# Patient Record
Sex: Female | Born: 1950 | Race: Black or African American | Hispanic: No | Smoking: Current every day smoker
Health system: Southern US, Community
[De-identification: ages and names within clinical notes are randomized; demographics above are authoritative.]

## PROBLEM LIST (undated history)

## (undated) DIAGNOSIS — M7989 Other specified soft tissue disorders: Secondary | ICD-10-CM

## (undated) DIAGNOSIS — I509 Heart failure, unspecified: Secondary | ICD-10-CM

## (undated) DIAGNOSIS — I10 Essential (primary) hypertension: Secondary | ICD-10-CM

## (undated) DIAGNOSIS — F209 Schizophrenia, unspecified: Secondary | ICD-10-CM

## (undated) DIAGNOSIS — M1990 Unspecified osteoarthritis, unspecified site: Secondary | ICD-10-CM

## (undated) DIAGNOSIS — D126 Benign neoplasm of colon, unspecified: Secondary | ICD-10-CM

## (undated) DIAGNOSIS — J45909 Unspecified asthma, uncomplicated: Secondary | ICD-10-CM

## (undated) DIAGNOSIS — F319 Bipolar disorder, unspecified: Secondary | ICD-10-CM

## (undated) DIAGNOSIS — R911 Solitary pulmonary nodule: Secondary | ICD-10-CM

## (undated) DIAGNOSIS — J189 Pneumonia, unspecified organism: Secondary | ICD-10-CM

## (undated) DIAGNOSIS — J449 Chronic obstructive pulmonary disease, unspecified: Secondary | ICD-10-CM

## (undated) DIAGNOSIS — I4891 Unspecified atrial fibrillation: Secondary | ICD-10-CM

## (undated) DIAGNOSIS — N281 Cyst of kidney, acquired: Secondary | ICD-10-CM

## (undated) DIAGNOSIS — E039 Hypothyroidism, unspecified: Secondary | ICD-10-CM

## (undated) DIAGNOSIS — E079 Disorder of thyroid, unspecified: Secondary | ICD-10-CM

## (undated) DIAGNOSIS — E876 Hypokalemia: Secondary | ICD-10-CM

## (undated) DIAGNOSIS — E1129 Type 2 diabetes mellitus with other diabetic kidney complication: Secondary | ICD-10-CM

## (undated) DIAGNOSIS — N182 Chronic kidney disease, stage 2 (mild): Secondary | ICD-10-CM

## (undated) DIAGNOSIS — K760 Fatty (change of) liver, not elsewhere classified: Secondary | ICD-10-CM

## (undated) DIAGNOSIS — G8929 Other chronic pain: Secondary | ICD-10-CM

## (undated) DIAGNOSIS — M545 Low back pain, unspecified: Secondary | ICD-10-CM

## (undated) DIAGNOSIS — M419 Scoliosis, unspecified: Secondary | ICD-10-CM

## (undated) DIAGNOSIS — K319 Disease of stomach and duodenum, unspecified: Secondary | ICD-10-CM

## (undated) DIAGNOSIS — E7800 Pure hypercholesterolemia, unspecified: Secondary | ICD-10-CM

## (undated) DIAGNOSIS — R0600 Dyspnea, unspecified: Secondary | ICD-10-CM

## (undated) HISTORY — DX: Hypokalemia: E87.6

## (undated) HISTORY — DX: Disease of stomach and duodenum, unspecified: K31.9

## (undated) HISTORY — DX: Disorder of thyroid, unspecified: E07.9

## (undated) HISTORY — DX: Schizophrenia, unspecified: F20.9

## (undated) HISTORY — PX: TOE SURGERY: SHX1073

## (undated) HISTORY — PX: KNEE ARTHROSCOPY: SHX127

## (undated) HISTORY — DX: Pure hypercholesterolemia, unspecified: E78.00

## (undated) HISTORY — DX: Heart failure, unspecified: I50.9

## (undated) HISTORY — PX: TONSILLECTOMY AND ADENOIDECTOMY: SUR1326

## (undated) HISTORY — DX: Bipolar disorder, unspecified: F31.9

## (undated) HISTORY — DX: Fatty (change of) liver, not elsewhere classified: K76.0

## (undated) HISTORY — DX: Other specified soft tissue disorders: M79.89

## (undated) HISTORY — DX: Cyst of kidney, acquired: N28.1

## (undated) HISTORY — DX: Chronic obstructive pulmonary disease, unspecified: J44.9

## (undated) HISTORY — DX: Type 2 diabetes mellitus with other diabetic kidney complication: E11.29

## (undated) HISTORY — DX: Essential (primary) hypertension: I10

## (undated) HISTORY — PX: FRACTURE SURGERY: SHX138

## (undated) HISTORY — PX: FOOT FRACTURE SURGERY: SHX645

## (undated) HISTORY — DX: Chronic kidney disease, stage 2 (mild): N18.2

## (undated) HISTORY — DX: Unspecified atrial fibrillation: I48.91

## (undated) HISTORY — DX: Hypothyroidism, unspecified: E03.9

## (undated) HISTORY — PX: FOOT SURGERY: SHX648

## (undated) HISTORY — DX: Solitary pulmonary nodule: R91.1

## (undated) HISTORY — DX: Benign neoplasm of colon, unspecified: D12.6

---

## 1997-08-11 ENCOUNTER — Inpatient Hospital Stay (HOSPITAL_COMMUNITY): Admission: EM | Admit: 1997-08-11 | Discharge: 1997-08-11 | Payer: Self-pay | Admitting: *Deleted

## 1997-10-14 ENCOUNTER — Ambulatory Visit (HOSPITAL_COMMUNITY): Admission: RE | Admit: 1997-10-14 | Discharge: 1997-10-14 | Payer: Self-pay | Admitting: *Deleted

## 1997-11-03 ENCOUNTER — Inpatient Hospital Stay (HOSPITAL_COMMUNITY): Admission: RE | Admit: 1997-11-03 | Discharge: 1997-11-03 | Payer: Self-pay | Admitting: *Deleted

## 1997-11-28 ENCOUNTER — Ambulatory Visit (HOSPITAL_COMMUNITY): Admission: RE | Admit: 1997-11-28 | Discharge: 1997-11-28 | Payer: Self-pay | Admitting: *Deleted

## 1997-12-25 ENCOUNTER — Ambulatory Visit (HOSPITAL_COMMUNITY): Admission: RE | Admit: 1997-12-25 | Discharge: 1997-12-25 | Payer: Self-pay | Admitting: *Deleted

## 1998-01-26 ENCOUNTER — Inpatient Hospital Stay (HOSPITAL_COMMUNITY): Admission: AD | Admit: 1998-01-26 | Discharge: 1998-01-26 | Payer: Self-pay | Admitting: *Deleted

## 1998-01-30 ENCOUNTER — Ambulatory Visit (HOSPITAL_COMMUNITY): Admission: RE | Admit: 1998-01-30 | Discharge: 1998-01-30 | Payer: Self-pay | Admitting: *Deleted

## 1998-02-06 ENCOUNTER — Emergency Department (HOSPITAL_COMMUNITY): Admission: EM | Admit: 1998-02-06 | Discharge: 1998-02-06 | Payer: Self-pay | Admitting: Emergency Medicine

## 1998-04-20 ENCOUNTER — Inpatient Hospital Stay (HOSPITAL_COMMUNITY): Admission: AD | Admit: 1998-04-20 | Discharge: 1998-04-20 | Payer: Self-pay | Admitting: Obstetrics & Gynecology

## 1998-04-28 ENCOUNTER — Ambulatory Visit (HOSPITAL_COMMUNITY): Admission: RE | Admit: 1998-04-28 | Discharge: 1998-04-28 | Payer: Self-pay | Admitting: *Deleted

## 1998-05-26 ENCOUNTER — Encounter: Payer: Self-pay | Admitting: *Deleted

## 1998-07-22 ENCOUNTER — Inpatient Hospital Stay (HOSPITAL_COMMUNITY): Admission: AD | Admit: 1998-07-22 | Discharge: 1998-07-22 | Payer: Self-pay | Admitting: *Deleted

## 1998-08-25 ENCOUNTER — Encounter: Admission: RE | Admit: 1998-08-25 | Discharge: 1998-08-25 | Payer: Self-pay | Admitting: Sports Medicine

## 1998-09-22 ENCOUNTER — Encounter: Admission: RE | Admit: 1998-09-22 | Discharge: 1998-09-22 | Payer: Self-pay | Admitting: Sports Medicine

## 1998-10-14 ENCOUNTER — Inpatient Hospital Stay (HOSPITAL_COMMUNITY): Admission: AD | Admit: 1998-10-14 | Discharge: 1998-10-14 | Payer: Self-pay | Admitting: *Deleted

## 1998-10-27 ENCOUNTER — Encounter: Admission: RE | Admit: 1998-10-27 | Discharge: 1998-10-27 | Payer: Self-pay | Admitting: Sports Medicine

## 1998-11-02 ENCOUNTER — Encounter: Admission: RE | Admit: 1998-11-02 | Discharge: 1998-11-02 | Payer: Self-pay | Admitting: Family Medicine

## 1998-11-17 ENCOUNTER — Encounter: Admission: RE | Admit: 1998-11-17 | Discharge: 1998-11-17 | Payer: Self-pay | Admitting: Sports Medicine

## 1998-11-17 ENCOUNTER — Ambulatory Visit (HOSPITAL_COMMUNITY): Admission: RE | Admit: 1998-11-17 | Discharge: 1998-11-17 | Payer: Self-pay | Admitting: *Deleted

## 1998-11-27 ENCOUNTER — Encounter: Admission: RE | Admit: 1998-11-27 | Discharge: 1998-11-27 | Payer: Self-pay | Admitting: Family Medicine

## 1998-12-01 ENCOUNTER — Ambulatory Visit (HOSPITAL_COMMUNITY): Admission: RE | Admit: 1998-12-01 | Discharge: 1998-12-01 | Payer: Self-pay | Admitting: Family Medicine

## 1998-12-08 ENCOUNTER — Encounter: Payer: Self-pay | Admitting: Sports Medicine

## 1998-12-08 ENCOUNTER — Ambulatory Visit (HOSPITAL_COMMUNITY): Admission: RE | Admit: 1998-12-08 | Discharge: 1998-12-08 | Payer: Self-pay | Admitting: Sports Medicine

## 1998-12-18 ENCOUNTER — Emergency Department (HOSPITAL_COMMUNITY): Admission: EM | Admit: 1998-12-18 | Discharge: 1998-12-18 | Payer: Self-pay | Admitting: Emergency Medicine

## 1998-12-18 ENCOUNTER — Encounter: Payer: Self-pay | Admitting: Emergency Medicine

## 1998-12-23 ENCOUNTER — Encounter: Admission: RE | Admit: 1998-12-23 | Discharge: 1998-12-23 | Payer: Self-pay | Admitting: Family Medicine

## 1999-01-06 ENCOUNTER — Ambulatory Visit (HOSPITAL_COMMUNITY): Admission: RE | Admit: 1999-01-06 | Discharge: 1999-01-06 | Payer: Self-pay | Admitting: Family Medicine

## 1999-01-13 ENCOUNTER — Inpatient Hospital Stay (HOSPITAL_COMMUNITY): Admission: AD | Admit: 1999-01-13 | Discharge: 1999-01-13 | Payer: Self-pay | Admitting: *Deleted

## 1999-01-13 ENCOUNTER — Encounter: Admission: RE | Admit: 1999-01-13 | Discharge: 1999-01-13 | Payer: Self-pay | Admitting: Family Medicine

## 1999-01-19 ENCOUNTER — Encounter: Admission: RE | Admit: 1999-01-19 | Discharge: 1999-01-19 | Payer: Self-pay | Admitting: Family Medicine

## 1999-02-03 ENCOUNTER — Encounter: Admission: RE | Admit: 1999-02-03 | Discharge: 1999-02-03 | Payer: Self-pay | Admitting: Family Medicine

## 1999-03-03 ENCOUNTER — Encounter: Admission: RE | Admit: 1999-03-03 | Discharge: 1999-03-03 | Payer: Self-pay | Admitting: Family Medicine

## 1999-03-12 ENCOUNTER — Encounter: Admission: RE | Admit: 1999-03-12 | Discharge: 1999-03-12 | Payer: Self-pay | Admitting: Family Medicine

## 1999-03-14 ENCOUNTER — Encounter: Payer: Self-pay | Admitting: Emergency Medicine

## 1999-03-14 ENCOUNTER — Emergency Department (HOSPITAL_COMMUNITY): Admission: EM | Admit: 1999-03-14 | Discharge: 1999-03-14 | Payer: Self-pay | Admitting: *Deleted

## 1999-03-25 ENCOUNTER — Encounter: Admission: RE | Admit: 1999-03-25 | Discharge: 1999-03-25 | Payer: Self-pay | Admitting: Family Medicine

## 1999-04-07 ENCOUNTER — Inpatient Hospital Stay (HOSPITAL_COMMUNITY): Admission: AD | Admit: 1999-04-07 | Discharge: 1999-04-07 | Payer: Self-pay | Admitting: *Deleted

## 1999-04-08 ENCOUNTER — Encounter: Admission: RE | Admit: 1999-04-08 | Discharge: 1999-04-08 | Payer: Self-pay | Admitting: Family Medicine

## 1999-04-20 ENCOUNTER — Encounter: Admission: RE | Admit: 1999-04-20 | Discharge: 1999-04-20 | Payer: Self-pay | Admitting: Sports Medicine

## 1999-04-29 ENCOUNTER — Encounter: Admission: RE | Admit: 1999-04-29 | Discharge: 1999-04-29 | Payer: Self-pay | Admitting: Orthopedic Surgery

## 1999-04-29 ENCOUNTER — Encounter: Payer: Self-pay | Admitting: Orthopedic Surgery

## 1999-05-11 ENCOUNTER — Encounter: Admission: RE | Admit: 1999-05-11 | Discharge: 1999-05-11 | Payer: Self-pay | Admitting: Sports Medicine

## 1999-05-12 ENCOUNTER — Encounter: Admission: RE | Admit: 1999-05-12 | Discharge: 1999-05-12 | Payer: Self-pay | Admitting: Family Medicine

## 1999-05-31 ENCOUNTER — Encounter: Payer: Self-pay | Admitting: *Deleted

## 1999-05-31 ENCOUNTER — Encounter: Admission: RE | Admit: 1999-05-31 | Discharge: 1999-05-31 | Payer: Self-pay | Admitting: *Deleted

## 1999-06-03 ENCOUNTER — Encounter: Admission: RE | Admit: 1999-06-03 | Discharge: 1999-06-03 | Payer: Self-pay | Admitting: Family Medicine

## 1999-07-08 ENCOUNTER — Encounter: Admission: RE | Admit: 1999-07-08 | Discharge: 1999-07-08 | Payer: Self-pay | Admitting: Family Medicine

## 1999-07-23 ENCOUNTER — Encounter: Admission: RE | Admit: 1999-07-23 | Discharge: 1999-07-23 | Payer: Self-pay | Admitting: Family Medicine

## 1999-08-02 ENCOUNTER — Encounter: Admission: RE | Admit: 1999-08-02 | Discharge: 1999-08-02 | Payer: Self-pay | Admitting: Family Medicine

## 1999-08-11 ENCOUNTER — Encounter: Admission: RE | Admit: 1999-08-11 | Discharge: 1999-08-11 | Payer: Self-pay | Admitting: Family Medicine

## 1999-09-18 ENCOUNTER — Encounter: Payer: Self-pay | Admitting: Emergency Medicine

## 1999-09-18 ENCOUNTER — Inpatient Hospital Stay (HOSPITAL_COMMUNITY): Admission: EM | Admit: 1999-09-18 | Discharge: 1999-09-20 | Payer: Self-pay | Admitting: *Deleted

## 1999-09-20 ENCOUNTER — Encounter: Payer: Self-pay | Admitting: Family Medicine

## 1999-09-22 ENCOUNTER — Encounter: Admission: RE | Admit: 1999-09-22 | Discharge: 1999-09-22 | Payer: Self-pay | Admitting: Family Medicine

## 1999-10-21 ENCOUNTER — Encounter: Admission: RE | Admit: 1999-10-21 | Discharge: 1999-10-21 | Payer: Self-pay | Admitting: Family Medicine

## 1999-11-04 ENCOUNTER — Encounter: Payer: Self-pay | Admitting: Gastroenterology

## 1999-11-04 ENCOUNTER — Encounter: Admission: RE | Admit: 1999-11-04 | Discharge: 1999-11-04 | Payer: Self-pay | Admitting: Gastroenterology

## 2000-01-14 ENCOUNTER — Encounter: Admission: RE | Admit: 2000-01-14 | Discharge: 2000-01-14 | Payer: Self-pay | Admitting: Family Medicine

## 2000-01-19 ENCOUNTER — Encounter: Admission: RE | Admit: 2000-01-19 | Discharge: 2000-01-19 | Payer: Self-pay | Admitting: Family Medicine

## 2000-02-07 ENCOUNTER — Encounter: Admission: RE | Admit: 2000-02-07 | Discharge: 2000-03-01 | Payer: Self-pay | Admitting: Orthopedic Surgery

## 2000-03-11 ENCOUNTER — Encounter: Payer: Self-pay | Admitting: *Deleted

## 2000-03-11 ENCOUNTER — Emergency Department (HOSPITAL_COMMUNITY): Admission: EM | Admit: 2000-03-11 | Discharge: 2000-03-11 | Payer: Self-pay | Admitting: Emergency Medicine

## 2000-04-20 ENCOUNTER — Encounter: Admission: RE | Admit: 2000-04-20 | Discharge: 2000-04-20 | Payer: Self-pay | Admitting: Family Medicine

## 2000-04-26 ENCOUNTER — Encounter: Admission: RE | Admit: 2000-04-26 | Discharge: 2000-04-26 | Payer: Self-pay | Admitting: Family Medicine

## 2000-04-27 ENCOUNTER — Encounter: Admission: RE | Admit: 2000-04-27 | Discharge: 2000-04-27 | Payer: Self-pay | Admitting: Family Medicine

## 2000-05-03 ENCOUNTER — Encounter: Payer: Self-pay | Admitting: *Deleted

## 2000-05-03 ENCOUNTER — Encounter: Admission: RE | Admit: 2000-05-03 | Discharge: 2000-05-03 | Payer: Self-pay | Admitting: *Deleted

## 2000-06-08 ENCOUNTER — Encounter: Admission: RE | Admit: 2000-06-08 | Discharge: 2000-06-08 | Payer: Self-pay | Admitting: *Deleted

## 2000-06-08 ENCOUNTER — Encounter: Payer: Self-pay | Admitting: *Deleted

## 2000-07-21 ENCOUNTER — Encounter: Admission: RE | Admit: 2000-07-21 | Discharge: 2000-07-21 | Payer: Self-pay | Admitting: Family Medicine

## 2000-08-28 ENCOUNTER — Encounter: Admission: RE | Admit: 2000-08-28 | Discharge: 2000-08-28 | Payer: Self-pay | Admitting: Family Medicine

## 2000-09-06 ENCOUNTER — Observation Stay (HOSPITAL_COMMUNITY): Admission: EM | Admit: 2000-09-06 | Discharge: 2000-09-06 | Payer: Self-pay | Admitting: Emergency Medicine

## 2000-09-06 ENCOUNTER — Encounter: Payer: Self-pay | Admitting: Internal Medicine

## 2000-09-19 ENCOUNTER — Encounter: Admission: RE | Admit: 2000-09-19 | Discharge: 2000-09-19 | Payer: Self-pay | Admitting: Family Medicine

## 2000-10-06 ENCOUNTER — Encounter: Admission: RE | Admit: 2000-10-06 | Discharge: 2000-10-06 | Payer: Self-pay | Admitting: Family Medicine

## 2000-10-23 ENCOUNTER — Emergency Department (HOSPITAL_COMMUNITY): Admission: EM | Admit: 2000-10-23 | Discharge: 2000-10-24 | Payer: Self-pay | Admitting: Emergency Medicine

## 2000-10-24 ENCOUNTER — Encounter: Admission: RE | Admit: 2000-10-24 | Discharge: 2000-10-24 | Payer: Self-pay | Admitting: Family Medicine

## 2000-10-24 ENCOUNTER — Encounter: Payer: Self-pay | Admitting: Family Medicine

## 2000-10-25 ENCOUNTER — Encounter: Admission: RE | Admit: 2000-10-25 | Discharge: 2000-10-25 | Payer: Self-pay | Admitting: Family Medicine

## 2000-10-26 ENCOUNTER — Encounter: Payer: Self-pay | Admitting: Sports Medicine

## 2000-10-26 ENCOUNTER — Encounter: Admission: RE | Admit: 2000-10-26 | Discharge: 2000-10-26 | Payer: Self-pay | Admitting: Sports Medicine

## 2000-10-26 ENCOUNTER — Encounter: Admission: RE | Admit: 2000-10-26 | Discharge: 2000-10-26 | Payer: Self-pay | Admitting: Family Medicine

## 2000-11-06 ENCOUNTER — Encounter: Admission: RE | Admit: 2000-11-06 | Discharge: 2000-11-06 | Payer: Self-pay | Admitting: Family Medicine

## 2000-11-21 ENCOUNTER — Encounter: Admission: RE | Admit: 2000-11-21 | Discharge: 2000-12-20 | Payer: Self-pay | Admitting: *Deleted

## 2000-11-29 ENCOUNTER — Encounter: Admission: RE | Admit: 2000-11-29 | Discharge: 2000-11-29 | Payer: Self-pay | Admitting: Family Medicine

## 2000-12-12 ENCOUNTER — Encounter: Admission: RE | Admit: 2000-12-12 | Discharge: 2000-12-12 | Payer: Self-pay | Admitting: Family Medicine

## 2001-02-21 ENCOUNTER — Other Ambulatory Visit: Admission: RE | Admit: 2001-02-21 | Discharge: 2001-02-21 | Payer: Self-pay | Admitting: *Deleted

## 2001-02-21 ENCOUNTER — Encounter: Admission: RE | Admit: 2001-02-21 | Discharge: 2001-02-21 | Payer: Self-pay | Admitting: Family Medicine

## 2001-03-23 ENCOUNTER — Encounter: Admission: RE | Admit: 2001-03-23 | Discharge: 2001-03-23 | Payer: Self-pay | Admitting: Family Medicine

## 2001-06-22 ENCOUNTER — Encounter: Admission: RE | Admit: 2001-06-22 | Discharge: 2001-06-22 | Payer: Self-pay | Admitting: Family Medicine

## 2001-07-18 ENCOUNTER — Encounter: Admission: RE | Admit: 2001-07-18 | Discharge: 2001-07-18 | Payer: Self-pay | Admitting: Family Medicine

## 2001-08-01 ENCOUNTER — Encounter: Admission: RE | Admit: 2001-08-01 | Discharge: 2001-08-01 | Payer: Self-pay | Admitting: Family Medicine

## 2001-08-29 ENCOUNTER — Encounter: Payer: Self-pay | Admitting: Obstetrics and Gynecology

## 2001-08-29 ENCOUNTER — Encounter: Admission: RE | Admit: 2001-08-29 | Discharge: 2001-08-29 | Payer: Self-pay | Admitting: Obstetrics and Gynecology

## 2001-09-13 ENCOUNTER — Encounter: Admission: RE | Admit: 2001-09-13 | Discharge: 2001-09-13 | Payer: Self-pay | Admitting: Family Medicine

## 2001-09-24 ENCOUNTER — Encounter: Admission: RE | Admit: 2001-09-24 | Discharge: 2001-09-24 | Payer: Self-pay | Admitting: Sports Medicine

## 2002-02-08 ENCOUNTER — Encounter: Admission: RE | Admit: 2002-02-08 | Discharge: 2002-02-08 | Payer: Self-pay | Admitting: Family Medicine

## 2002-03-15 ENCOUNTER — Encounter: Admission: RE | Admit: 2002-03-15 | Discharge: 2002-03-15 | Payer: Self-pay | Admitting: Family Medicine

## 2002-03-28 ENCOUNTER — Encounter: Admission: RE | Admit: 2002-03-28 | Discharge: 2002-03-28 | Payer: Self-pay | Admitting: Family Medicine

## 2002-05-23 ENCOUNTER — Encounter: Admission: RE | Admit: 2002-05-23 | Discharge: 2002-05-23 | Payer: Self-pay | Admitting: Family Medicine

## 2002-05-23 ENCOUNTER — Other Ambulatory Visit: Admission: RE | Admit: 2002-05-23 | Discharge: 2002-05-23 | Payer: Self-pay

## 2002-08-29 ENCOUNTER — Encounter: Admission: RE | Admit: 2002-08-29 | Discharge: 2002-08-29 | Payer: Self-pay | Admitting: Family Medicine

## 2002-09-25 ENCOUNTER — Encounter: Admission: RE | Admit: 2002-09-25 | Discharge: 2002-09-25 | Payer: Self-pay | Admitting: Internal Medicine

## 2002-09-25 ENCOUNTER — Encounter: Payer: Self-pay | Admitting: Sports Medicine

## 2003-01-03 ENCOUNTER — Encounter: Admission: RE | Admit: 2003-01-03 | Discharge: 2003-01-03 | Payer: Self-pay | Admitting: Family Medicine

## 2003-04-09 ENCOUNTER — Encounter: Admission: RE | Admit: 2003-04-09 | Discharge: 2003-04-09 | Payer: Self-pay | Admitting: Family Medicine

## 2003-07-23 ENCOUNTER — Encounter: Admission: RE | Admit: 2003-07-23 | Discharge: 2003-07-23 | Payer: Self-pay | Admitting: Family Medicine

## 2003-07-23 ENCOUNTER — Other Ambulatory Visit: Admission: RE | Admit: 2003-07-23 | Discharge: 2003-07-23 | Payer: Self-pay | Admitting: Family Medicine

## 2003-08-15 ENCOUNTER — Encounter: Admission: RE | Admit: 2003-08-15 | Discharge: 2003-08-15 | Payer: Self-pay | Admitting: Family Medicine

## 2003-10-10 ENCOUNTER — Encounter: Admission: RE | Admit: 2003-10-10 | Discharge: 2003-10-10 | Payer: Self-pay | Admitting: Sports Medicine

## 2003-12-12 ENCOUNTER — Encounter: Admission: RE | Admit: 2003-12-12 | Discharge: 2003-12-12 | Payer: Self-pay | Admitting: Sports Medicine

## 2004-03-18 ENCOUNTER — Ambulatory Visit: Payer: Self-pay | Admitting: Family Medicine

## 2004-04-07 ENCOUNTER — Encounter: Admission: RE | Admit: 2004-04-07 | Discharge: 2004-07-06 | Payer: Self-pay | Admitting: Orthopedic Surgery

## 2004-05-31 ENCOUNTER — Ambulatory Visit: Payer: Self-pay | Admitting: Family Medicine

## 2004-08-17 ENCOUNTER — Ambulatory Visit: Payer: Self-pay | Admitting: Sports Medicine

## 2004-12-01 ENCOUNTER — Encounter: Admission: RE | Admit: 2004-12-01 | Discharge: 2004-12-01 | Payer: Self-pay | Admitting: Sports Medicine

## 2004-12-29 ENCOUNTER — Ambulatory Visit: Payer: Self-pay | Admitting: Family Medicine

## 2005-02-13 ENCOUNTER — Emergency Department (HOSPITAL_COMMUNITY): Admission: EM | Admit: 2005-02-13 | Discharge: 2005-02-13 | Payer: Self-pay | Admitting: Emergency Medicine

## 2005-02-23 ENCOUNTER — Ambulatory Visit: Payer: Self-pay | Admitting: Family Medicine

## 2005-04-19 ENCOUNTER — Ambulatory Visit: Payer: Self-pay | Admitting: Family Medicine

## 2005-06-17 ENCOUNTER — Ambulatory Visit: Payer: Self-pay | Admitting: Family Medicine

## 2005-08-04 ENCOUNTER — Encounter (INDEPENDENT_AMBULATORY_CARE_PROVIDER_SITE_OTHER): Payer: Self-pay | Admitting: *Deleted

## 2005-08-04 LAB — CONVERTED CEMR LAB

## 2005-08-17 ENCOUNTER — Ambulatory Visit: Payer: Self-pay | Admitting: Family Medicine

## 2005-12-02 ENCOUNTER — Ambulatory Visit: Payer: Self-pay | Admitting: Family Medicine

## 2006-03-10 ENCOUNTER — Ambulatory Visit: Payer: Self-pay | Admitting: Family Medicine

## 2006-05-18 ENCOUNTER — Ambulatory Visit: Payer: Self-pay | Admitting: Emergency Medicine

## 2006-06-23 ENCOUNTER — Ambulatory Visit: Payer: Self-pay | Admitting: Family Medicine

## 2006-06-28 ENCOUNTER — Ambulatory Visit: Payer: Self-pay | Admitting: Critical Care Medicine

## 2006-07-10 ENCOUNTER — Encounter (INDEPENDENT_AMBULATORY_CARE_PROVIDER_SITE_OTHER): Payer: Self-pay | Admitting: *Deleted

## 2006-07-10 ENCOUNTER — Other Ambulatory Visit: Admission: RE | Admit: 2006-07-10 | Discharge: 2006-07-10 | Payer: Self-pay | Admitting: *Deleted

## 2006-07-10 ENCOUNTER — Ambulatory Visit: Payer: Self-pay | Admitting: Family Medicine

## 2006-07-18 ENCOUNTER — Ambulatory Visit: Payer: Self-pay | Admitting: Internal Medicine

## 2006-08-15 ENCOUNTER — Encounter: Admission: RE | Admit: 2006-08-15 | Discharge: 2006-08-15 | Payer: Self-pay | Admitting: Sports Medicine

## 2006-08-31 DIAGNOSIS — M479 Spondylosis, unspecified: Secondary | ICD-10-CM | POA: Insufficient documentation

## 2006-08-31 DIAGNOSIS — E039 Hypothyroidism, unspecified: Secondary | ICD-10-CM | POA: Insufficient documentation

## 2006-08-31 DIAGNOSIS — I10 Essential (primary) hypertension: Secondary | ICD-10-CM

## 2006-08-31 DIAGNOSIS — F17200 Nicotine dependence, unspecified, uncomplicated: Secondary | ICD-10-CM

## 2006-08-31 DIAGNOSIS — Z720 Tobacco use: Secondary | ICD-10-CM | POA: Insufficient documentation

## 2006-08-31 DIAGNOSIS — E119 Type 2 diabetes mellitus without complications: Secondary | ICD-10-CM | POA: Insufficient documentation

## 2006-08-31 DIAGNOSIS — F319 Bipolar disorder, unspecified: Secondary | ICD-10-CM

## 2006-08-31 DIAGNOSIS — E1129 Type 2 diabetes mellitus with other diabetic kidney complication: Secondary | ICD-10-CM

## 2006-08-31 DIAGNOSIS — E7800 Pure hypercholesterolemia, unspecified: Secondary | ICD-10-CM

## 2006-08-31 DIAGNOSIS — D509 Iron deficiency anemia, unspecified: Secondary | ICD-10-CM | POA: Insufficient documentation

## 2006-08-31 HISTORY — DX: Hypothyroidism, unspecified: E03.9

## 2006-08-31 HISTORY — DX: Pure hypercholesterolemia, unspecified: E78.00

## 2006-08-31 HISTORY — DX: Type 2 diabetes mellitus with other diabetic kidney complication: E11.29

## 2006-08-31 HISTORY — DX: Bipolar disorder, unspecified: F31.9

## 2006-08-31 HISTORY — DX: Essential (primary) hypertension: I10

## 2006-09-01 ENCOUNTER — Encounter (INDEPENDENT_AMBULATORY_CARE_PROVIDER_SITE_OTHER): Payer: Self-pay | Admitting: *Deleted

## 2006-09-12 ENCOUNTER — Encounter (INDEPENDENT_AMBULATORY_CARE_PROVIDER_SITE_OTHER): Payer: Self-pay | Admitting: Family Medicine

## 2006-09-12 LAB — CONVERTED CEMR LAB: Pap Smear: NORMAL

## 2006-09-27 ENCOUNTER — Encounter (INDEPENDENT_AMBULATORY_CARE_PROVIDER_SITE_OTHER): Payer: Self-pay | Admitting: *Deleted

## 2006-09-27 ENCOUNTER — Ambulatory Visit: Payer: Self-pay | Admitting: Family Medicine

## 2006-09-27 LAB — CONVERTED CEMR LAB
ALT: 19 units/L (ref 0–35)
AST: 20 units/L (ref 0–37)
Albumin: 4.2 g/dL (ref 3.5–5.2)
Alkaline Phosphatase: 78 units/L (ref 39–117)
BUN: 11 mg/dL (ref 6–23)
CO2: 27 meq/L (ref 19–32)
Calcium: 9.4 mg/dL (ref 8.4–10.5)
Chloride: 102 meq/L (ref 96–112)
Cholesterol: 225 mg/dL — ABNORMAL HIGH (ref 0–200)
Creatinine, Ser: 0.79 mg/dL (ref 0.40–1.20)
Glucose, Bld: 76 mg/dL (ref 70–99)
HCT: 45.5 % (ref 36.0–46.0)
HDL: 40 mg/dL (ref >39)
Hemoglobin: 14.7 g/dL (ref 12.0–15.0)
LDL Cholesterol: 131 mg/dL — ABNORMAL HIGH (ref 0–99)
MCHC: 32.3 g/dL (ref 30.0–36.0)
MCV: 112.3 fL — ABNORMAL HIGH (ref 78.0–100.0)
Platelets: 195 10*3/uL (ref 150–400)
Potassium: 4.1 meq/L (ref 3.5–5.3)
RBC: 4.05 M/uL (ref 3.87–5.11)
RDW: 15.8 % — ABNORMAL HIGH (ref 11.5–14.0)
Sodium: 139 meq/L (ref 135–145)
TSH: 1.014 microintl units/mL (ref 0.350–5.50)
Total Bilirubin: 0.2 mg/dL — ABNORMAL LOW (ref 0.3–1.2)
Total CHOL/HDL Ratio: 5.6
Total Protein: 7.6 g/dL (ref 6.0–8.3)
Triglycerides: 269 mg/dL — ABNORMAL HIGH (ref <150)
VLDL: 54 mg/dL — ABNORMAL HIGH (ref 0–40)
Valproic Acid Lvl: 37.3 ug/mL — ABNORMAL LOW (ref 50.0–100.0)
WBC: 5.3 10*3/uL (ref 4.0–10.5)

## 2006-09-28 ENCOUNTER — Encounter (INDEPENDENT_AMBULATORY_CARE_PROVIDER_SITE_OTHER): Payer: Self-pay | Admitting: *Deleted

## 2006-09-28 ENCOUNTER — Ambulatory Visit: Payer: Self-pay | Admitting: Internal Medicine

## 2006-10-02 ENCOUNTER — Telehealth: Payer: Self-pay | Admitting: *Deleted

## 2006-11-16 ENCOUNTER — Telehealth: Payer: Self-pay | Admitting: *Deleted

## 2006-11-20 ENCOUNTER — Ambulatory Visit: Payer: Self-pay | Admitting: Family Medicine

## 2006-11-20 ENCOUNTER — Telehealth (INDEPENDENT_AMBULATORY_CARE_PROVIDER_SITE_OTHER): Payer: Self-pay | Admitting: *Deleted

## 2006-11-20 LAB — CONVERTED CEMR LAB
Bilirubin Urine: NEGATIVE
Blood in Urine, dipstick: NEGATIVE
Glucose, Urine, Semiquant: NEGATIVE
KOH Prep: NEGATIVE
Ketones, urine, test strip: NEGATIVE
Nitrite: NEGATIVE
Protein, U semiquant: NEGATIVE
Specific Gravity, Urine: 1.015
Urobilinogen, UA: 0.2
WBC Urine, dipstick: NEGATIVE
pH: 5.5

## 2006-11-30 ENCOUNTER — Ambulatory Visit: Payer: Self-pay | Admitting: Pulmonary Disease

## 2006-12-07 ENCOUNTER — Encounter (INDEPENDENT_AMBULATORY_CARE_PROVIDER_SITE_OTHER): Payer: Self-pay | Admitting: *Deleted

## 2006-12-11 ENCOUNTER — Encounter (INDEPENDENT_AMBULATORY_CARE_PROVIDER_SITE_OTHER): Payer: Self-pay | Admitting: Family Medicine

## 2006-12-11 LAB — CONVERTED CEMR LAB: LDL Cholesterol: 80 mg/dL

## 2006-12-19 ENCOUNTER — Ambulatory Visit: Payer: Self-pay | Admitting: Family Medicine

## 2007-01-02 ENCOUNTER — Ambulatory Visit: Payer: Self-pay | Admitting: Internal Medicine

## 2007-01-10 ENCOUNTER — Ambulatory Visit: Payer: Self-pay | Admitting: Family Medicine

## 2007-01-10 ENCOUNTER — Encounter (INDEPENDENT_AMBULATORY_CARE_PROVIDER_SITE_OTHER): Payer: Self-pay | Admitting: *Deleted

## 2007-01-10 LAB — CONVERTED CEMR LAB
ALT: 10 units/L (ref 0–35)
AST: 19 units/L (ref 0–37)
Albumin: 3.5 g/dL (ref 3.5–5.2)
Alkaline Phosphatase: 73 units/L (ref 39–117)
BUN: 14 mg/dL (ref 6–23)
CO2: 25 meq/L (ref 19–32)
Calcium: 9 mg/dL (ref 8.4–10.5)
Chloride: 111 meq/L (ref 96–112)
Cholesterol, target level: 200 mg/dL
Creatinine, Ser: 1.06 mg/dL (ref 0.40–1.20)
Direct LDL: 88 mg/dL
Glucose, Bld: 84 mg/dL (ref 70–99)
HDL goal, serum: 40 mg/dL
LDL Goal: 100 mg/dL
Potassium: 3.7 meq/L (ref 3.5–5.3)
Sodium: 145 meq/L (ref 135–145)
Total Bilirubin: 0.2 mg/dL — ABNORMAL LOW (ref 0.3–1.2)
Total Protein: 6.5 g/dL (ref 6.0–8.3)

## 2007-03-09 ENCOUNTER — Ambulatory Visit: Payer: Self-pay | Admitting: Family Medicine

## 2007-03-15 ENCOUNTER — Encounter (INDEPENDENT_AMBULATORY_CARE_PROVIDER_SITE_OTHER): Payer: Self-pay | Admitting: *Deleted

## 2007-03-17 ENCOUNTER — Telehealth (INDEPENDENT_AMBULATORY_CARE_PROVIDER_SITE_OTHER): Payer: Self-pay | Admitting: Family Medicine

## 2007-03-28 ENCOUNTER — Encounter: Admission: RE | Admit: 2007-03-28 | Discharge: 2007-05-10 | Payer: Self-pay | Admitting: Family Medicine

## 2007-03-30 ENCOUNTER — Telehealth: Payer: Self-pay | Admitting: *Deleted

## 2007-04-02 ENCOUNTER — Encounter (INDEPENDENT_AMBULATORY_CARE_PROVIDER_SITE_OTHER): Payer: Self-pay | Admitting: Family Medicine

## 2007-04-02 ENCOUNTER — Encounter: Admission: RE | Admit: 2007-04-02 | Discharge: 2007-04-02 | Payer: Self-pay | Admitting: Sports Medicine

## 2007-04-02 ENCOUNTER — Ambulatory Visit: Payer: Self-pay | Admitting: Sports Medicine

## 2007-04-05 ENCOUNTER — Encounter: Payer: Self-pay | Admitting: Family Medicine

## 2007-04-18 ENCOUNTER — Telehealth: Payer: Self-pay | Admitting: *Deleted

## 2007-04-23 ENCOUNTER — Encounter: Payer: Self-pay | Admitting: *Deleted

## 2007-04-26 ENCOUNTER — Telehealth: Payer: Self-pay | Admitting: *Deleted

## 2007-05-17 ENCOUNTER — Encounter (INDEPENDENT_AMBULATORY_CARE_PROVIDER_SITE_OTHER): Payer: Self-pay | Admitting: Family Medicine

## 2007-05-18 ENCOUNTER — Encounter (INDEPENDENT_AMBULATORY_CARE_PROVIDER_SITE_OTHER): Payer: Self-pay | Admitting: *Deleted

## 2007-05-25 ENCOUNTER — Telehealth (INDEPENDENT_AMBULATORY_CARE_PROVIDER_SITE_OTHER): Payer: Self-pay | Admitting: Family Medicine

## 2007-05-25 ENCOUNTER — Telehealth: Payer: Self-pay | Admitting: *Deleted

## 2007-07-11 ENCOUNTER — Telehealth (INDEPENDENT_AMBULATORY_CARE_PROVIDER_SITE_OTHER): Payer: Self-pay | Admitting: Family Medicine

## 2007-07-11 ENCOUNTER — Ambulatory Visit: Payer: Self-pay | Admitting: Family Medicine

## 2007-07-11 LAB — CONVERTED CEMR LAB: Hgb A1c MFr Bld: 5.7 %

## 2007-08-09 ENCOUNTER — Telehealth: Payer: Self-pay | Admitting: *Deleted

## 2007-08-29 ENCOUNTER — Encounter (INDEPENDENT_AMBULATORY_CARE_PROVIDER_SITE_OTHER): Payer: Self-pay | Admitting: Family Medicine

## 2007-08-30 ENCOUNTER — Encounter: Payer: Self-pay | Admitting: *Deleted

## 2007-08-30 ENCOUNTER — Ambulatory Visit: Payer: Self-pay | Admitting: Family Medicine

## 2007-08-30 LAB — CONVERTED CEMR LAB
Glucose, Urine, Semiquant: NEGATIVE
Ketones, urine, test strip: NEGATIVE
Nitrite: NEGATIVE
Protein, U semiquant: >=300
Specific Gravity, Urine: 1.02
Urobilinogen, UA: NEGATIVE
pH: 6

## 2007-08-31 ENCOUNTER — Encounter (INDEPENDENT_AMBULATORY_CARE_PROVIDER_SITE_OTHER): Payer: Self-pay | Admitting: Family Medicine

## 2007-09-04 ENCOUNTER — Encounter (INDEPENDENT_AMBULATORY_CARE_PROVIDER_SITE_OTHER): Payer: Self-pay | Admitting: *Deleted

## 2007-10-15 ENCOUNTER — Encounter (INDEPENDENT_AMBULATORY_CARE_PROVIDER_SITE_OTHER): Payer: Self-pay | Admitting: *Deleted

## 2007-10-22 ENCOUNTER — Encounter: Admission: RE | Admit: 2007-10-22 | Discharge: 2007-10-22 | Payer: Self-pay | Admitting: Family Medicine

## 2007-11-09 ENCOUNTER — Ambulatory Visit: Payer: Self-pay | Admitting: Family Medicine

## 2007-11-09 ENCOUNTER — Encounter (INDEPENDENT_AMBULATORY_CARE_PROVIDER_SITE_OTHER): Payer: Self-pay | Admitting: Family Medicine

## 2007-11-11 ENCOUNTER — Telehealth (INDEPENDENT_AMBULATORY_CARE_PROVIDER_SITE_OTHER): Payer: Self-pay | Admitting: Family Medicine

## 2007-11-16 ENCOUNTER — Encounter (INDEPENDENT_AMBULATORY_CARE_PROVIDER_SITE_OTHER): Payer: Self-pay | Admitting: Family Medicine

## 2007-11-22 ENCOUNTER — Telehealth (INDEPENDENT_AMBULATORY_CARE_PROVIDER_SITE_OTHER): Payer: Self-pay | Admitting: Family Medicine

## 2007-12-03 ENCOUNTER — Telehealth: Payer: Self-pay | Admitting: *Deleted

## 2007-12-24 LAB — CONVERTED CEMR LAB: Pap Smear: NORMAL

## 2008-01-03 ENCOUNTER — Telehealth: Payer: Self-pay | Admitting: *Deleted

## 2008-01-23 ENCOUNTER — Telehealth: Payer: Self-pay | Admitting: *Deleted

## 2008-01-25 ENCOUNTER — Ambulatory Visit: Payer: Self-pay | Admitting: Family Medicine

## 2008-02-11 ENCOUNTER — Ambulatory Visit: Payer: Self-pay | Admitting: Family Medicine

## 2008-02-22 ENCOUNTER — Telehealth: Payer: Self-pay | Admitting: Family Medicine

## 2008-02-29 ENCOUNTER — Ambulatory Visit: Payer: Self-pay | Admitting: Family Medicine

## 2008-02-29 DIAGNOSIS — M1710 Unilateral primary osteoarthritis, unspecified knee: Secondary | ICD-10-CM | POA: Insufficient documentation

## 2008-02-29 DIAGNOSIS — IMO0002 Reserved for concepts with insufficient information to code with codable children: Secondary | ICD-10-CM | POA: Insufficient documentation

## 2008-02-29 LAB — CONVERTED CEMR LAB: Hgb A1c MFr Bld: 6.2 %

## 2008-05-20 ENCOUNTER — Telehealth: Payer: Self-pay | Admitting: *Deleted

## 2008-06-02 ENCOUNTER — Encounter: Payer: Self-pay | Admitting: *Deleted

## 2008-06-12 ENCOUNTER — Encounter: Payer: Self-pay | Admitting: Family Medicine

## 2008-06-12 ENCOUNTER — Ambulatory Visit: Payer: Self-pay | Admitting: Family Medicine

## 2008-06-23 ENCOUNTER — Telehealth: Payer: Self-pay | Admitting: Family Medicine

## 2008-06-30 ENCOUNTER — Ambulatory Visit: Payer: Self-pay | Admitting: Family Medicine

## 2008-06-30 ENCOUNTER — Encounter: Payer: Self-pay | Admitting: Family Medicine

## 2008-06-30 LAB — CONVERTED CEMR LAB
Bilirubin Urine: NEGATIVE
Blood in Urine, dipstick: NEGATIVE
Glucose, Urine, Semiquant: NEGATIVE
Hgb A1c MFr Bld: 6.1 %
Ketones, urine, test strip: NEGATIVE
Nitrite: NEGATIVE
Protein, U semiquant: NEGATIVE
Specific Gravity, Urine: <1.005
Urobilinogen, UA: 0.2
WBC Urine, dipstick: NEGATIVE
pH: 6

## 2008-07-02 LAB — CONVERTED CEMR LAB
ALT: 9 units/L (ref 0–35)
AST: 13 units/L (ref 0–37)
Albumin: 3.7 g/dL (ref 3.5–5.2)
Alkaline Phosphatase: 74 units/L (ref 39–117)
BUN: 15 mg/dL (ref 6–23)
CO2: 24 meq/L (ref 19–32)
Calcium: 9 mg/dL (ref 8.4–10.5)
Chloride: 106 meq/L (ref 96–112)
Creatinine, Ser: 1.02 mg/dL (ref 0.40–1.20)
Glucose, Bld: 97 mg/dL (ref 70–99)
Potassium: 3.8 meq/L (ref 3.5–5.3)
Sodium: 142 meq/L (ref 135–145)
Total Bilirubin: 0.2 mg/dL — ABNORMAL LOW (ref 0.3–1.2)
Total Protein: 7 g/dL (ref 6.0–8.3)

## 2008-07-17 ENCOUNTER — Telehealth: Payer: Self-pay | Admitting: Family Medicine

## 2008-07-17 ENCOUNTER — Telehealth: Payer: Self-pay | Admitting: *Deleted

## 2008-08-11 ENCOUNTER — Telehealth: Payer: Self-pay | Admitting: Family Medicine

## 2008-08-12 ENCOUNTER — Ambulatory Visit: Payer: Self-pay | Admitting: Family Medicine

## 2008-08-14 ENCOUNTER — Telehealth (INDEPENDENT_AMBULATORY_CARE_PROVIDER_SITE_OTHER): Payer: Self-pay | Admitting: *Deleted

## 2008-08-20 ENCOUNTER — Telehealth: Payer: Self-pay | Admitting: *Deleted

## 2008-08-26 ENCOUNTER — Ambulatory Visit: Payer: Self-pay | Admitting: Family Medicine

## 2008-08-26 LAB — CONVERTED CEMR LAB: Hgb A1c MFr Bld: 5.7 %

## 2008-10-16 ENCOUNTER — Telehealth (INDEPENDENT_AMBULATORY_CARE_PROVIDER_SITE_OTHER): Payer: Self-pay | Admitting: *Deleted

## 2008-11-11 ENCOUNTER — Telehealth: Payer: Self-pay | Admitting: Family Medicine

## 2009-02-17 ENCOUNTER — Ambulatory Visit: Payer: Self-pay | Admitting: Family Medicine

## 2009-02-17 LAB — CONVERTED CEMR LAB
Bilirubin Urine: NEGATIVE
Blood in Urine, dipstick: NEGATIVE
Glucose, Urine, Semiquant: NEGATIVE
Hgb A1c MFr Bld: 6.1 %
Ketones, urine, test strip: NEGATIVE
Nitrite: NEGATIVE
Protein, U semiquant: 30
Specific Gravity, Urine: 1.02
Urobilinogen, UA: 1
WBC Urine, dipstick: NEGATIVE
pH: 6

## 2009-03-10 ENCOUNTER — Ambulatory Visit: Payer: Self-pay | Admitting: Family Medicine

## 2009-03-27 ENCOUNTER — Encounter: Admission: RE | Admit: 2009-03-27 | Discharge: 2009-03-27 | Payer: Self-pay | Admitting: Family Medicine

## 2009-04-13 ENCOUNTER — Telehealth: Payer: Self-pay | Admitting: Family Medicine

## 2009-04-14 ENCOUNTER — Telehealth: Payer: Self-pay | Admitting: Family Medicine

## 2009-06-02 ENCOUNTER — Ambulatory Visit: Payer: Self-pay | Admitting: Family Medicine

## 2009-06-02 ENCOUNTER — Encounter: Payer: Self-pay | Admitting: *Deleted

## 2009-06-02 ENCOUNTER — Encounter: Payer: Self-pay | Admitting: Family Medicine

## 2009-06-03 ENCOUNTER — Encounter (INDEPENDENT_AMBULATORY_CARE_PROVIDER_SITE_OTHER): Payer: Self-pay

## 2009-06-03 LAB — CONVERTED CEMR LAB
ALT: 10 units/L (ref 0–35)
AST: 14 units/L (ref 0–37)
Albumin: 3.9 g/dL (ref 3.5–5.2)
Alkaline Phosphatase: 70 units/L (ref 39–117)
BUN: 15 mg/dL (ref 6–23)
CO2: 22 meq/L (ref 19–32)
Calcium: 9.2 mg/dL (ref 8.4–10.5)
Chloride: 106 meq/L (ref 96–112)
Cholesterol: 151 mg/dL (ref 0–200)
Creatinine, Ser: 1.07 mg/dL (ref 0.40–1.20)
Glucose, Bld: 87 mg/dL (ref 70–99)
HDL: 42 mg/dL (ref >39)
LDL Cholesterol: 95 mg/dL (ref 0–99)
Potassium: 3.1 meq/L — ABNORMAL LOW (ref 3.5–5.3)
Sodium: 144 meq/L (ref 135–145)
Total Bilirubin: 0.2 mg/dL — ABNORMAL LOW (ref 0.3–1.2)
Total CHOL/HDL Ratio: 3.6
Total Protein: 7.1 g/dL (ref 6.0–8.3)
Triglycerides: 71 mg/dL (ref <150)
VLDL: 14 mg/dL (ref 0–40)

## 2009-06-09 ENCOUNTER — Encounter: Payer: Self-pay | Admitting: Family Medicine

## 2009-06-15 ENCOUNTER — Encounter: Payer: Self-pay | Admitting: Family Medicine

## 2009-06-15 ENCOUNTER — Ambulatory Visit: Payer: Self-pay | Admitting: Family Medicine

## 2009-06-15 LAB — CONVERTED CEMR LAB: Beta hcg, urine, semiquantitative: NEGATIVE

## 2009-06-16 LAB — CONVERTED CEMR LAB
Chlamydia, DNA Probe: NEGATIVE
GC Probe Amp, Genital: NEGATIVE

## 2009-06-22 ENCOUNTER — Telehealth: Payer: Self-pay | Admitting: Family Medicine

## 2009-07-09 ENCOUNTER — Ambulatory Visit: Payer: Self-pay | Admitting: Family Medicine

## 2009-07-09 ENCOUNTER — Encounter: Payer: Self-pay | Admitting: Family Medicine

## 2009-07-09 LAB — CONVERTED CEMR LAB
Bilirubin Urine: NEGATIVE
Glucose, Urine, Semiquant: NEGATIVE
Nitrite: NEGATIVE
Protein, U semiquant: 100
RBC / HPF: >20
Specific Gravity, Urine: 1.015
Urobilinogen, UA: 0.2
WBC, UA: >20 cells/hpf
pH: 6.5

## 2009-09-07 ENCOUNTER — Telehealth: Payer: Self-pay | Admitting: *Deleted

## 2009-09-11 ENCOUNTER — Ambulatory Visit: Payer: Self-pay | Admitting: Family Medicine

## 2009-09-11 ENCOUNTER — Encounter: Payer: Self-pay | Admitting: Family Medicine

## 2009-09-11 DIAGNOSIS — R109 Unspecified abdominal pain: Secondary | ICD-10-CM | POA: Insufficient documentation

## 2009-09-14 ENCOUNTER — Telehealth: Payer: Self-pay | Admitting: Family Medicine

## 2009-09-14 DIAGNOSIS — N182 Chronic kidney disease, stage 2 (mild): Secondary | ICD-10-CM | POA: Insufficient documentation

## 2009-09-14 HISTORY — DX: Chronic kidney disease, stage 2 (mild): N18.2

## 2009-09-14 LAB — CONVERTED CEMR LAB
ALT: 15 units/L (ref 0–35)
AST: 16 units/L (ref 0–37)
Albumin: 3.5 g/dL (ref 3.5–5.2)
Alkaline Phosphatase: 56 units/L (ref 39–117)
BUN: 16 mg/dL (ref 6–23)
CO2: 27 meq/L (ref 19–32)
Calcium: 9.3 mg/dL (ref 8.4–10.5)
Chloride: 104 meq/L (ref 96–112)
Creatinine, Ser: 1.21 mg/dL — ABNORMAL HIGH (ref 0.40–1.20)
Glucose, Bld: 112 mg/dL — ABNORMAL HIGH (ref 70–99)
Potassium: 3.5 meq/L (ref 3.5–5.3)
Sodium: 142 meq/L (ref 135–145)
TSH: 0.548 microintl units/mL (ref 0.350–4.500)
Total Bilirubin: 0.2 mg/dL — ABNORMAL LOW (ref 0.3–1.2)
Total Protein: 6.3 g/dL (ref 6.0–8.3)
Valproic Acid Lvl: 49.8 ug/mL — ABNORMAL LOW (ref 50.0–100.0)

## 2009-10-08 ENCOUNTER — Encounter: Payer: Self-pay | Admitting: *Deleted

## 2009-11-17 ENCOUNTER — Ambulatory Visit: Payer: Self-pay | Admitting: Family Medicine

## 2009-11-17 ENCOUNTER — Encounter: Payer: Self-pay | Admitting: Family Medicine

## 2009-11-18 LAB — CONVERTED CEMR LAB
BUN: 10 mg/dL (ref 6–23)
CO2: 24 meq/L (ref 19–32)
Calcium: 9.3 mg/dL (ref 8.4–10.5)
Chloride: 105 meq/L (ref 96–112)
Creatinine, Ser: 1.12 mg/dL (ref 0.40–1.20)
Glucose, Bld: 136 mg/dL — ABNORMAL HIGH (ref 70–99)
Lipase: 36 units/L (ref 0–75)
Potassium: 3.9 meq/L (ref 3.5–5.3)
Sodium: 141 meq/L (ref 135–145)

## 2010-01-11 ENCOUNTER — Telehealth: Payer: Self-pay | Admitting: Family Medicine

## 2010-03-05 ENCOUNTER — Telehealth: Payer: Self-pay | Admitting: *Deleted

## 2010-03-05 ENCOUNTER — Ambulatory Visit: Payer: Self-pay | Admitting: Family Medicine

## 2010-03-09 ENCOUNTER — Encounter: Admission: RE | Admit: 2010-03-09 | Discharge: 2010-03-09 | Payer: Self-pay | Admitting: Family Medicine

## 2010-03-10 ENCOUNTER — Telehealth (INDEPENDENT_AMBULATORY_CARE_PROVIDER_SITE_OTHER): Payer: Self-pay | Admitting: *Deleted

## 2010-03-25 ENCOUNTER — Encounter: Payer: Self-pay | Admitting: Family Medicine

## 2010-04-14 ENCOUNTER — Encounter: Admission: RE | Admit: 2010-04-14 | Discharge: 2010-04-14 | Payer: Self-pay

## 2010-04-22 ENCOUNTER — Encounter: Payer: Self-pay | Admitting: Family Medicine

## 2010-04-22 ENCOUNTER — Ambulatory Visit: Payer: Self-pay | Admitting: Family Medicine

## 2010-04-22 LAB — CONVERTED CEMR LAB
Beta hcg, urine, semiquantitative: NEGATIVE
Bilirubin Urine: NEGATIVE
Blood in Urine, dipstick: NEGATIVE
Chlamydia, DNA Probe: NEGATIVE
GC Probe Amp, Genital: NEGATIVE
Glucose, Urine, Semiquant: NEGATIVE
HIV: NONREACTIVE
Ketones, urine, test strip: NEGATIVE
Nitrite: NEGATIVE
Protein, U semiquant: NEGATIVE
Specific Gravity, Urine: 1.025
Urobilinogen, UA: 0.2
WBC Urine, dipstick: NEGATIVE
Whiff Test: NEGATIVE
pH: 6

## 2010-04-23 ENCOUNTER — Encounter: Payer: Self-pay | Admitting: Pharmacist

## 2010-04-26 ENCOUNTER — Telehealth (INDEPENDENT_AMBULATORY_CARE_PROVIDER_SITE_OTHER): Payer: Self-pay | Admitting: *Deleted

## 2010-04-30 ENCOUNTER — Encounter: Payer: Self-pay | Admitting: Family Medicine

## 2010-05-07 ENCOUNTER — Ambulatory Visit: Payer: Self-pay | Admitting: Family Medicine

## 2010-05-07 ENCOUNTER — Encounter: Payer: Self-pay | Admitting: Family Medicine

## 2010-05-07 LAB — CONVERTED CEMR LAB
OCCULT 1: NEGATIVE
OCCULT 2: NEGATIVE
OCCULT 3: NEGATIVE

## 2010-05-21 ENCOUNTER — Encounter: Payer: Self-pay | Admitting: Family Medicine

## 2010-06-16 ENCOUNTER — Ambulatory Visit: Payer: Self-pay | Admitting: Family Medicine

## 2010-06-16 DIAGNOSIS — R0602 Shortness of breath: Secondary | ICD-10-CM | POA: Insufficient documentation

## 2010-07-14 ENCOUNTER — Encounter: Payer: Self-pay | Admitting: Family Medicine

## 2010-07-22 ENCOUNTER — Ambulatory Visit: Admit: 2010-07-22 | Payer: Self-pay

## 2010-07-25 ENCOUNTER — Encounter: Payer: Self-pay | Admitting: Family Medicine

## 2010-07-26 ENCOUNTER — Encounter: Payer: Self-pay | Admitting: Family Medicine

## 2010-07-27 ENCOUNTER — Ambulatory Visit: Admit: 2010-07-27 | Payer: Self-pay

## 2010-08-02 ENCOUNTER — Telehealth: Payer: Self-pay | Admitting: *Deleted

## 2010-08-03 ENCOUNTER — Encounter: Payer: Self-pay | Admitting: Family Medicine

## 2010-08-05 NOTE — Progress Notes (Signed)
Summary: Triage  Phone Note Call from Patient Call back at Home Phone [phone]   Summary of Call: pt is concerned about her blood sugar, states it has been very high lately. Initial call taken by: Haydee Salter,  August 14, 2008 2:46 PM  Follow-up for Phone Call        133 this am. passed out last night & it was 534. drank vinegar & water & it went down promtly. states she has not been eating "right" eats fried foods & sweets. now it is 189. saw her heart doctor this am. on prednisone & antibiotic for lung infection. EKG was ok per pt but wants her back in 1 month to do a catherterization.  explained that infections can cause cbgs to be elevated. advised drinking plenty of water & exercising whe it is high. asked her to start a food diary. explained what that was & how to do it.  she is agreeable to making an appt with Lexine Baton RN  to discuss diabetes. will ask Amy to call pt to set appt.  appt made with pcp on the 23rd of this month Follow-up by: Golden Circle RN,  August 14, 2008 2:54 PM  Additional Follow-up for Phone Call Additional follow up Details #1::        Called pt- she asked that I call back after 12pm because she had not gotten up yet, and would not be able to write it down. Additional Follow-up by: AMY MARTIN RN,  August 15, 2008 9:33 AM    Additional Follow-up for Phone Call Additional follow up Details #2::    Phone call to pt- to set her up for diabetes education session.  She states she has alot of appointments in the next coming weeks, and would like to schedule appt later.  Advised pt to call back to schedule appt when she would be able to come in.  Pt agreeable. Follow-up by: Jacki Cones RN,  August 26, 2008 12:12 PM

## 2010-08-05 NOTE — Miscellaneous (Signed)
Summary: Update CKD stage  Clinical Lists Changes  Problems: Changed problem from RENAL INSUFFICIENCY, ACUTE (ICD-585.9) to CHRONIC KIDNEY DISEASE STAGE II (MILD) (ICD-585.2) - eGFR 64

## 2010-08-05 NOTE — Assessment & Plan Note (Signed)
Summary: PFT - Rx Clinic   Vital Signs:  Patient Profile:   60 Years Old Female Height:     62.5 inches Weight:      161.5 pounds BMI:     29.17 Pulse rate:   85 / minute BP sitting:   118 / 83  (left arm)                 PCP:  Marisue Ivan  MD   History of Present Illness: Patietn reports chronic cough for severaldays which she would like prednisone and tessalon pearls.  She is willing to wait until her f/u with Dr. Burnadette Pop on Thursday as we started late today and she is unwilling to wait for an afternoon work-in appt.     Current Allergies (reviewed today): * CDN LISINOPRIL (LISINOPRIL)    Risk Factors:  Tobacco use:  current    Year started:  1973    Counseled to quit/cut down tobacco use:  yes      Impression & Recommendations:  Problem # 1:  COPD (ICD-496) Assessment: Deteriorated Moderate Restriction deomonstrated on Spirometry. Reviewed results of pulmonary function tests.  Pt verbalized understanding of results.  Patietn denies using tiotropium inhaler despite this being on medication list.  I am happy to review the use of this an reinitiate this at next smoking cessation visit.    F/U Rx Clinic Visit:  2-3 weeks. TTFFC: 50    mins.  Patient seen with: Kathe Mariner PharmD candidate and Weston Brass, PharmD   Her updated medication list for this problem includes:    Albuterol 90 Mcg/act Aers (Albuterol) ..... Inhale 2 puff using inhaler every four hours    Spiriva Handihaler 18 Mcg Caps (Tiotropium bromide monohydrate) ..... Q.s. 30 days - one capsule once a day    Advair Diskus 250-50 Mcg/dose Misc (Fluticasone-salmeterol) .Marland Kitchen... 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  However she is willing to attempt use reduction and would be willing to talk about setting a quit date.  She is open to considering Chanitx but is interested in quitting withou the use of drugs.   F/U Rx Clinic Visit:  2-3 weeks.     Orders: PFT Baseline-Pre/Post Bronchodiolator (PFT Baseline-Pre/Pos)   Complete Medication List: 1)  Albuterol 90 Mcg/act Aers (Albuterol) .... Inhale 2 puff using inhaler every four hours 2)  Ativan 0.5 Mg Tabs (Lorazepam) .... Take 1 tablet by mouth twice a day 3)  Bayer Childrens Aspirin 81 Mg Chew (Aspirin) .... Take 1 tablet by mouth once a day 4)  Crestor 40 Mg Tabs (Rosuvastatin calcium) .... Take 1 tablet by mouth once a day 5)  Depakote 250 Mg Tbec (Divalproex sodium) .... Take four times daily 6)  Hydrochlorothiazide 25 Mg Tabs (Hydrochlorothiazide) .... Take 1 tablet by mouth every morning 7)  Metformin Hcl 500 Mg Tabs (Metformin hcl) .... Take 1 tablet by mouth once a day 8)  Nasacort Aq 55 Mcg/act Aers (Triamcinolone acetonide(nasal)) .... Spray 2 spray into both nostrils once a day 9)  Norvasc 5 Mg Tabs (Amlodipine besylate) .... Take 1 tablet by mouth once a day 10)  Optichamber Advantage Misc (Spacer/aero-holding chambers) 11)  Protonix 40 Mg Tbec (Pantoprazole sodium) .Marland KitchenMarland KitchenMarland Kitchen  1 tablet by mouth once a day 12)  Risperdal 2 Mg Tabs (Risperidone) .... Take 1 tablet by mouth at bedtime 13)  Synthroid 75 Mcg Tabs (Levothyroxine sodium) .... Take 1 tablet by mouth once a day 14)  Spiriva Handihaler 18 Mcg Caps (Tiotropium bromide monohydrate) .... Q.s. 30 days - one capsule once a day 15)  Advair Diskus 250-50 Mcg/dose Misc (Fluticasone-salmeterol) .Marland Kitchen.. 1 puff twice a day   Patient Instructions: 1)  Please schedule a follow-up appointment in 2 weeks in Rx clinic. 2)  Please try to cut down to 7-8 cigarettes per day. 3)  Avoid smoking until 30 minutes after lunch. 4)  Bring all medications to next visit with Pharmacist.     Prescriptions: DEPAKOTE 250 MG TBEC (DIVALPROEX SODIUM) Take four times daily  #120 x 0   Entered and Authorized by:   Madelon Lips PHARMD   Signed by:   Jacki Cones RN on 02/11/2008   Method used:   Historical   RxID:   [id number]  ]  Medication Administration  Medication # 1:    Medication: Albuterol Sulfate Sol 1mg  unit dose    Diagnosis: COPD (ICD-496)    Dose: 2.5 mg    Route: inhaled    Exp Date: 08/2009    Lot #: [id number]    Mfr: Nephron    Patient tolerated medication without complications    Given by: AMY MARTIN RN (February 11, 2008 5:04 PM)  Orders Added: 1)  Albuterol Sulfate Sol 1mg  unit dose [O9629] 2)  PFT Baseline-Pre/Post Bronchodiolator [PFT Baseline-Pre/Pos]  Tobacco Counseling   Currently uses tobacco.    Cigarettes      Year started smoking cigarettes:      1973     Number of packs per day smoked:      1/2     Years smoked:            36     Packs per year:          0     Pack Years:            0  Counseled to quit/cut down on tobacco use.   Readiness to Quit:     Not Ready Cessation Stage:     Contemplative  Quitting Barriers:      -  stress     -  anxiety     -  enjoys smoking  Quitting Motivators:      -  dyspnea     -  COPD     -  children  Previous Quit Attempts   Previously Tried to quit:     Yes # of Previous quit attempts:     4 Longest Successful Quit Period:   9 months  Quit Methods Tried:      -  counseling     -  nicotine patch     -  nicotine gum     -  Zyban  Reason for restarting:      -  stress     -  anxiety  Comments: Quit during pregnancy X 4 kids - [place] Attempt to quit unsuccessfully in the past with patch, gum, and Zyban  Smoking Cessation Plan   Readiness:     Not Ready Cessation Stage:   Contemplative    Pulmonary Function Test Date: 02/11/2008 Height (in.): 62.5 Gender: Female  Pre-Spirometry FVC    Value: 1.72 L/min   Pred: 2.52 L/min     %  Pred: 68 % FEV1    Value: 1.35 L      Pred: 2.06 L     % Pred: 65 % FEV1/FVC  Value: 78 %     Pred: 84 %     % Pred: 93 % FEF 25-75  Value: 1.18 L/min   Pred: 2.26 L/min     % Pred: 52 %  Post-Spirometry FVC    Value: 1.75 L/min   Pred: 2.52 L/min     % Pred: 69 % FEV1    Value: 1.37 L     Pred: 2.06 L     % Pred: 66 % FEV1/FVC  Value: 78 %     Pred: 84 %     % Pred: 93 % FEF 25-75  Value: 1.20 L/min   Pred: 2.26 L/min     % Pred: 53 %  Comments: Moderate Restriction  Lung Age: 84 years COPD isk = 58%  Recommendations: Willing to consider smoking cessation counseling - Willin got f/u in Rx clinic

## 2010-08-05 NOTE — Miscellaneous (Signed)
Summary: [facility]- continued ear discomfort  Clinical Lists Changes

## 2010-08-05 NOTE — Letter (Signed)
Summary: [facility]  [facility]   Imported By: Haydee Salter 11/19/2007 14:26:51  _____________________________________________________________________  External Attachment:    Type:   Image     Comment:   External Document

## 2010-08-05 NOTE — Miscellaneous (Signed)
Summary: The [facility]  Please mark the pages you would like for Admin Team to scan.  Hardcopy in MD box.

## 2010-08-05 NOTE — Assessment & Plan Note (Signed)
Summary: FU/KH   Vital Signs:  Patient Profile:   60 Years Old Female Height:     62.5 inches Weight:      162.7 pounds BMI:     29.39 Temp:     98.2 degrees F Pulse rate:   78 / minute BP sitting:   119 / 79  (left arm)  Pt. in pain?   no  Vitals Entered By: Dedra Skeens CMA, (March 09, 2007 9:53 AM)                  Chief Complaint:  F/U.  History of Present Illness: FU Neck pain - Patient says that he neck pain is worse, and that is exacerbating her Tension headaches.  She denies neuromotor focality, visual changes or acute increases in severity.  She denies trauma.  Diabetes Management History:      The patient is a 60 years old female who comes in for evaluation of DM Type 2.  She is not checking home blood sugars.  She says that she is not exercising regularly.        Hypoglycemic symptoms are not occurring.  No hyperglycemic symptoms are reported.        There are no symptoms to suggest diabetic complications.  No changes have been made to her treatment plan since last visit.        Her home fasting blood sugars are as follows: average: 102.    Headache HPI:      The patient comes in for chronic management of headaches which have been unstable.  Since the last visit, the frequency of headaches have not changed, and the intensity of the headaches have not changed.  She has approximately 5+ headaches per month.        The location of the headaches are occipital.  Headache quality is throbbing or pulsating and pressure or tightness.  Aggravating factors include head movement.  Precipitating factors consist of stress.        The patient denies first or worst H/A of life, change in frequency from prior H/A's, change in severity from prior H/A's, change in features from prior H/A's, new onset H/A's in middle-age or later, new or progressive H/A lasting days, H/A's with Valsalva (cough/sneeze), mylagia, fever, malaise, weight loss, scalp tenderness, jaw claudication, focal  neurologic symptoms, confusion, seizures, and impaired level of consciousness.         Headache Treatment History:      She has tried NSAID's and calcium channel blockers which were effective.    Hypertension History:      She denies headache, chest pain, palpitations, dyspnea with exertion, orthopnea, PND, peripheral edema, visual symptoms, neurologic problems, syncope, and side effects from treatment.  She notes no problems with any antihypertensive medication side effects.        Positive major cardiovascular risk factors include female age 58 years old or older, diabetes, hyperlipidemia, hypertension, family history for ischemic heart disease (females less than 33 years old), and current tobacco user.        Further assessment for target organ damage reveals no history of ASHD, cardiac end-organ damage (CHF/LVH), stroke/TIA, peripheral vascular disease, renal insufficiency, or hypertensive retinopathy.       Current Allergies: * CDN LISINOPRIL (LISINOPRIL)      Physical Exam  General:     Well-developed,well-nourished,in no acute distress; alert,appropriate and cooperative throughout examination Lungs:     Normal respiratory effort, chest expands symmetrically. Lungs are clear to auscultation, no  crackles or wheezes. Heart:     Normal rate and regular rhythm. S1 and S2 normal without gallop, murmur, click, rub or other extra sounds. Abdomen:     Bowel sounds positive,abdomen soft and non-tender without masses, organomegaly or hernias noted. Neurologic:     No cranial nerve deficits noted. Station and gait are normal. Plantar reflexes are down-going bilaterally. DTRs are symmetrical throughout. Sensory, motor and coordinative functions appear intact. Psych:     Cognition and judgment appear intact. Alert and cooperative with normal attention span and concentration. No apparent delusions, illusions, hallucinations  Diabetes Management Exam:    Foot Exam (with socks and/or shoes  not present):       Sensory-Pinprick/Light touch:          Left medial foot (L-4): normal          Left dorsal foot (L-5): normal          Left lateral foot (S-1): normal          Right medial foot (L-4): normal          Right dorsal foot (L-5): normal          Right lateral foot (S-1): normal       Sensory-Monofilament:          Left foot: normal          Right foot: normal       Inspection:          Left foot: normal          Right foot: normal       Nails:          Left foot: normal          Right foot: normal    Eye Exam:       Eye Exam done elsewhere          Date: 07/04/2006          Results: normal          Done by: Hollinder  Headache Neurologic Exam:    Cranial Nerves Intact:  Yes    Focal Neurologic Deficit:  No    Motor Exam/Strength-Left:       Left Biceps Flexion ( ):  normal       Left Triceps Extension ( ):  normal       Left Hand Grasp ( ):  normal       Left Deltoid ( ):  normal       Left Hip Flexors ( ):  normal       Left Ankle Dorsiflexion (L5,L4):  normal       Left Great Toe Dorsiflexion (L5,L4):  normal       Left Heel Walk (L5,some L4):  normal       Left Single Squat & Rise-Quads (L4):  normal       Left Toe Walk-calf (S1):  normal    Motor Exam/Strength-Right:       Right Biceps Flexion ( ):  normal       Right Triceps Extension ( ):  normal       Right Hand Grasp ( ):  normal       Right Deltoid ( ):  normal       Right Hip Flexors ( ):  normal       Right Ankle Dorsiflexion (L5,L4):  normal       Right Great Toe Dorsiflexion (L5,L4):  normal  Right Heel Walk (L5,some L4):  normal       Right Single Squat & Rise-Quads (L4):  normal       Right Toe Walk-calf (S1):  normal    Sensory Exam/Pinprick-Left:       Left Face:  normal       Left Upper Extremity:  normal       Left Lower Extremity:  normal       Left Medial Foot (L4):  normal       Left Dorsal Foot (L5):  normal       Left Lateral Foot (S1):  normal    Sensory  Exam/Pinprick-Right:       Right Face:  normal       Right Upper Extremity:  normal       Right Lower Extremity:  normal       Right Medial Foot (L4):  normal       Right Dorsal Foot (L5):  normal       Right Lateral Foot (S1):  normal    Reflexes-Left:       Left Biceps ( ):  normal       Left Triceps ( ):  normal       Left Brachioradialis ( ):  normal       Left Knee Jerk (L4):  normal       Left Ankle Reflex (S1):  normal    Reflexes-Right:       Right Biceps ( ):  normal       Right Triceps ( ):  normal       Right Brachioradialis ( ):  normal       Right Knee Jerk (L4):  normal       Right Ankle Reflex (S1):  normal    Impression & Recommendations:  Problem # 1:  DIABETES MELLITUS II, UNCOMPLICATED (ICD-250.00) Patient is well controlled. Wrote rx for diabetes test kit.   Her updated medication list for this problem includes:    Bayer Childrens Aspirin 81 Mg Chew (Aspirin) .Marland Kitchen... Take 1 tablet by mouth once a day    Metformin Hcl 500 Mg Tabs (Metformin hcl) .Marland Kitchen... Take 1 tablet by mouth once a day   Problem # 2:  HYPERTENSION, BENIGN SYSTEMIC (ICD-401.1) Well controlled.  Her updated medication list for this problem includes:    Hydrochlorothiazide 25 Mg Tabs (Hydrochlorothiazide) .Marland Kitchen... Take 1 tablet by mouth every morning    Norvasc 5 Mg Tabs (Amlodipine besylate) .Marland Kitchen... Take 1 tablet by mouth once a day  Orders: [facility]- Est  Level 4 (02725)   Problem # 3:  HEADACHE, TENSION (ICD-307.81) Suspect medication overuse.  Patient says that she needs Tylenol for OA. Her updated medication list for this problem includes:    Bayer Childrens Aspirin 81 Mg Chew (Aspirin) .Marland Kitchen... Take 1 tablet by mouth once a day  Orders: Physical Therapy Referral (PT) [facility]- Est  Level 4 (36644)   Problem # 4:  TOBACCO DEPENDENCE (ICD-305.1) Patient not willing to quit.  Problem # 5:  CERVICAL SPINE DISORDER, NOS (ICD-723.9) Assessment: Deteriorated Patient isn't feeling  better.   Problem # 6:  HYPERCHOLESTEROLEMIA (ICD-272.0) Well controlled.  Her updated medication list for this problem includes:    Crestor 40 Mg Tabs (Rosuvastatin calcium) .Marland Kitchen... Take 1 tablet by mouth once a day    Zetia 10 Mg Tabs (Ezetimibe) .Marland Kitchen... 1 tablet by mouth once a day  Orders: [facility]- Est  Level 4 (03474)   Complete  Medication List: 1)  Albuterol 90 Mcg/act Aers (Albuterol) .... Inhale 2 puff using inhaler every four hours 2)  Ativan 0.5 Mg Tabs (Lorazepam) .... Take 1 tablet by mouth twice a day 3)  Bayer Childrens Aspirin 81 Mg Chew (Aspirin) .... Take 1 tablet by mouth once a day 4)  Crestor 40 Mg Tabs (Rosuvastatin calcium) .... Take 1 tablet by mouth once a day 5)  Depakote 250 Mg Tbec (Divalproex sodium) .... Take 6)  Flovent Hfa 220 Mcg/act Aero (Fluticasone propionate  hfa) .... Inhale 1 puff using inhaler twice a day 7)  Hydrochlorothiazide 25 Mg Tabs (Hydrochlorothiazide) .... Take 1 tablet by mouth every morning 8)  Metformin Hcl 500 Mg Tabs (Metformin hcl) .... Take 1 tablet by mouth once a day 9)  Nasacort Aq 55 Mcg/act Aers (Triamcinolone acetonide(nasal)) .... Spray 2 spray into both nostrils once a day 10)  Norvasc 5 Mg Tabs (Amlodipine besylate) .... Take 1 tablet by mouth once a day 11)  Optichamber Advantage Misc (Spacer/aero-holding chambers) 12)  Protonix 40 Mg Tbec (Pantoprazole sodium) .Marland Kitchen.. 1 tablet by mouth once a day 13)  Risperdal 2 Mg Tabs (Risperidone) .... Take 1 tablet by mouth at bedtime 14)  Synthroid 75 Mcg Tabs (Levothyroxine sodium) .... Take 1 tablet by mouth once a day 15)  Zetia 10 Mg Tabs (Ezetimibe) .Marland Kitchen.. 1 tablet by mouth once a day 16)  Accu-chek Active Care Kit Kit (Blood glucose monitoring suppl) .... Test sugars once daily in the morning.  Diabetes Management Assessment/Plan:      The following lipid goals have been established for the patient: Total cholesterol goal of 200; LDL cholesterol goal of 100; HDL cholesterol goal of 40;  Triglyceride goal of 150.  Her blood pressure goal is < 130/80.    Headache Assessment/Plan:    Headache Classification:  Tension headache and analgesic withdrawl headache  Hypertension Assessment/Plan:      The patient's hypertensive risk group is category C: Target organ damage and/or diabetes.  Her calculated 10 year risk of coronary heart disease is 17 %.  Today's blood pressure is 119/79.  Her blood pressure goal is < 130/80.   Patient Instructions: 1)  Please schedule a follow-up appointment in 3 months.    Prescriptions: ACCU-CHEK ACTIVE CARE KIT   KIT (BLOOD GLUCOSE MONITORING SUPPL) Test sugars once daily in the morning.  #1 x 0   Entered and Authorized by:   Towana Badger MD   Signed by:   Towana Badger MD on 03/09/2007   Method used:   Electronically sent to ...       [id number] [address]       [address] [place] [place], [place]  [address]       Ph: [phone] or [phone]       Fax: [phone]   RxID:   [id number]

## 2010-08-05 NOTE — Progress Notes (Signed)
Summary: phn msg  Phone Note Call from Patient Call back at Home Phone [phone]   Caller: Patient Summary of Call: needs toilet lift for her from [facility] hurts knees when she tries to get up. wants to make sure that medicaid pays for it. Initial call taken by: De Nurse,  October 16, 2008 2:00 PM  Follow-up for Phone Call        pt would like an rx for a rasied toilet seat. she would like to be mailed to her. Follow-up by: Alphia Kava,  October 16, 2008 2:25 PM  Additional Follow-up for Phone Call Additional follow up Details #1::        pt will call back with the name of the toilet seat. Additional Follow-up by: Alphia Kava,  October 17, 2008 10:15 AM    Additional Follow-up for Phone Call Additional follow up Details #2::    Wanted to let Dr. Burnadette Pop know that an rx is not needed for elavated toliet seat.  They can purchase it without rx from medical supply store. Follow-up by: Clydell Hakim,  October 17, 2008 11:44 AM

## 2010-08-05 NOTE — Assessment & Plan Note (Signed)
Summary: vag itching,  F/u ear problem/ls   Vital Signs:  Patient Profile:   60 Years Old Female Height:     62.5 inches Weight:      169 pounds Temp:     97.9 degrees F Pulse rate:   101 / minute BP sitting:   117 / 72  (left arm)  Pt. in pain?   yes    Location:   knees, feet, lower back, right hip, neck, upper arms    Intensity:   7    Type:       aching  Vitals Entered ByJacki Cones RN (July 11, 2007 10:25 AM)                Vision Comments: 01/2008   Chief Complaint:  f/u ear problems, pt thinks time for CPP, and allergy symptoms.  History of Present Illness: Ear pain - Persistent.  Mostly on right.  External tingling pain, 4/10.  No tinnitus or hearing impairment.  No drainage.  Treated previously for otitis externa.  No fever or facial pain.  Eye discharge - Clear runny discharge from both eyes.  Patient has allergy problems.  No purulence, eye pain, visual disturbances.  No fever.  No trauma or foreign body.  No itching or burning.  URI - Patient with productive cough.  No fever.  Using albuterol daily.  Still smokes 1/2 ppd.  No SOB, hypoxia, distress.  Diabetes Management History:      The patient is a 60 years old female who comes in for evaluation of DM Type 2.  She is not checking home blood sugars.  She says that she is not exercising regularly.        Her home fasting blood sugars are as follows: average: 85.    Hypertension History:      Positive major cardiovascular risk factors include female age 38 years old or older, diabetes, hyperlipidemia, hypertension, family history for ischemic heart disease (females less than 91 years old), and current tobacco user.        Further assessment for target organ damage reveals no history of ASHD, cardiac end-organ damage (CHF/LVH), stroke/TIA, peripheral vascular disease, renal insufficiency, or hypertensive retinopathy.      Current Allergies: * CDN LISINOPRIL (LISINOPRIL)    Risk Factors:     Counseled  to quit/cut down tobacco use:  yes   Review of Systems      See HPI   Physical Exam  General:     Well-developed,well-nourished,in no acute distress; alert,appropriate and cooperative throughout examination Eyes:     vision grossly intact, pupils equal, pupils round, pupils reactive to light, pupils react to accomodation, corneas and lenses clear, no injection, no optic disk abnormalities, and excessive tearing.   Ears:     R canal inflamed and L canal inflamed.  White film over portion of both external ear canals.  No discharge.  Otherwise, TM normal bilaterally. Lungs:     Diffuse wheezes, no crackles.  Good air movement in all quadrants.  No intercostal retractions, and no accessory muscle use.   Heart:     Normal rate and regular rhythm. S1 and S2 normal without gallop, murmur, click, rub or other extra sounds. Extremities:     no edema Neurologic:     alert & oriented X3 and cranial nerves II-XII intact.    Diabetes Management Exam:    Foot Exam (with socks and/or shoes not present):       Sensory-Monofilament:  Left foot: normal          Right foot: normal    Eye Exam:       Eye Exam done elsewhere          Date: 01/02/2007          Results: normal          Done by: Nelle Don    Impression & Recommendations:  Problem # 1:  DIABETES MELLITUS II, UNCOMPLICATED (ICD-250.00) Assessment: Unchanged Updated labs. Well controlled. Patient is requesting new meter.  Asked her to call pharmacy and send specifics of the model she'd like.  Her updated medication list for this problem includes:    Bayer Childrens Aspirin 81 Mg Chew (Aspirin) .Marland Kitchen... Take 1 tablet by mouth once a day    Metformin Hcl 500 Mg Tabs (Metformin hcl) .Marland Kitchen... Take 1 tablet by mouth once a day  Orders: A1C-[facility] (04540) [facility]- Est  Level 4 (98119)   Problem # 2:  COPD (ICD-496) Changed to Spiriva.  Patient has URI and is noting daily use of albuterol. Mild exacerbation, adding  prednisone.  The following medications were removed from the medication list:    Flovent Hfa 220 Mcg/act Aero (Fluticasone propionate  hfa) ..... Inhale 1 puff using inhaler twice a day  Her updated medication list for this problem includes:    Albuterol 90 Mcg/act Aers (Albuterol) ..... Inhale 2 puff using inhaler every four hours    Spiriva Handihaler 18 Mcg Caps (Tiotropium bromide monohydrate) ..... Q.s. 30 days - one capsule once a day  Orders: [facility]- Est  Level 4 (14782)   Problem # 3:  TOBACCO DEPENDENCE (ICD-305.1) Advised quitting smoking.  She agrees, noting breathing problems and understanding the worsening of her pulmonary issues.  Orders: [facility]- Est  Level 4 (99214)   Problem # 4:  OTITIS EXTERNA, BILATERAL (ICD-380.10) Refilled Cipro Otic.  Return to clinic for follow-up in one month.  Her updated medication list for this problem includes:    Cipro Hc 0.2-1 % Susp (Ciprofloxacin-hydrocortisone) .Marland KitchenMarland KitchenMarland KitchenMarland Kitchen 3 drops into each ear twice a day for 7 days for external ear infection   Complete Medication List: 1)  Albuterol 90 Mcg/act Aers (Albuterol) .... Inhale 2 puff using inhaler every four hours 2)  Ativan 0.5 Mg Tabs (Lorazepam) .... Take 1 tablet by mouth twice a day 3)  Bayer Childrens Aspirin 81 Mg Chew (Aspirin) .... Take 1 tablet by mouth once a day 4)  Crestor 40 Mg Tabs (Rosuvastatin calcium) .... Take 1 tablet by mouth once a day 5)  Depakote 250 Mg Tbec (Divalproex sodium) .... Take 6)  Hydrochlorothiazide 25 Mg Tabs (Hydrochlorothiazide) .... Take 1 tablet by mouth every morning 7)  Metformin Hcl 500 Mg Tabs (Metformin hcl) .... Take 1 tablet by mouth once a day 8)  Nasacort Aq 55 Mcg/act Aers (Triamcinolone acetonide(nasal)) .... Spray 2 spray into both nostrils once a day 9)  Norvasc 5 Mg Tabs (Amlodipine besylate) .... Take 1 tablet by mouth once a day 10)  Optichamber Advantage Misc (Spacer/aero-holding chambers) 11)  Protonix 40 Mg Tbec (Pantoprazole sodium)  .Marland Kitchen.. 1 tablet by mouth once a day 12)  Risperdal 2 Mg Tabs (Risperidone) .... Take 1 tablet by mouth at bedtime 13)  Synthroid 75 Mcg Tabs (Levothyroxine sodium) .... Take 1 tablet by mouth once a day 14)  Zetia 10 Mg Tabs (Ezetimibe) .Marland Kitchen.. 1 tablet by mouth once a day 15)  Cipro Hc 0.2-1 % Susp (Ciprofloxacin-hydrocortisone) .... 3 drops into each  ear twice a day for 7 days for external ear infection 16)  Spiriva Handihaler 18 Mcg Caps (Tiotropium bromide monohydrate) .... Q.s. 30 days - one capsule once a day 17)  Prednisone 20 Mg Tabs (Prednisone) .... 2 tablets daily for 5 days 18)  Tessalon Perles 100 Mg Caps (Benzonatate) .... One tablet three times a day for cough  Other Orders: Flu Vaccine 48yrs + (29562) Pneumococcal Vaccine (13086) Admin 1st Vaccine (57846) Admin of Any Addtl Vaccine (96295) Flu Vaccine & Administration [phone])  Diabetes Management Assessment/Plan:      The following lipid goals have been established for the patient: Total cholesterol goal of 200; LDL cholesterol goal of 100; HDL cholesterol goal of 40; Triglyceride goal of 150.  Her blood pressure goal is < 130/80.    Hypertension Assessment/Plan:      The patient's hypertensive risk group is category C: Target organ damage and/or diabetes.  Her calculated 10 year risk of coronary heart disease is 13 %.  Today's blood pressure is 117/72.  Her blood pressure goal is < 130/80.   Patient Instructions: 1)  Please schedule a follow-up appointment in 1 month. 2)  Tobacco is very bad for your health and your loved ones! You Should stop smoking!.    Prescriptions: TESSALON PERLES 100 MG  CAPS (BENZONATATE) One tablet three times a day for cough  #21 x 0   Entered and Authorized by:   Towana Badger MD   Signed by:   Towana Badger MD on 07/11/2007   Method used:   Electronically sent to ...       [facility]  [address] [phone]*       [address]       [place], [place]  [address]       Ph: [phone]       Fax:  [phone]   RxID:   [id number] PREDNISONE 20 MG  TABS (PREDNISONE) 2 tablets daily for 5 days  #10 x 0   Entered and Authorized by:   Towana Badger MD   Signed by:   Towana Badger MD on 07/11/2007   Method used:   Electronically sent to ...       [facility]  [address] [phone]*       [address]       [place], [place]  [address]       Ph: [phone]       Fax: [phone]   RxID:   [id number] SPIRIVA HANDIHALER 18 MCG  CAPS (TIOTROPIUM BROMIDE MONOHYDRATE) Q.S. 30 days - One capsule once a day  #1 x 3   Entered and Authorized by:   Towana Badger MD   Signed by:   Towana Badger MD on 07/11/2007   Method used:   Electronically sent to ...       [facility]  [address] [phone]*       [address]       [place], [place]  [address]       Ph: [phone]       Fax: [phone]   RxID:   [id number] CIPRO HC 0.2-1 %  SUSP (CIPROFLOXACIN-HYDROCORTISONE) 3 drops into each ear twice a day for 7 days for external ear infection  #1 x 0   Entered and Authorized by:   Towana Badger MD   Signed by:   Towana Badger MD on 07/11/2007   Method used:   Electronically sent to ...       [facility]  [id number]*       [address]       [place], [place]  [address]       Ph: [phone]       Fax: [phone]   RxID:   [id number] PREDNISONE 20 MG  TABS (PREDNISONE) 2 tablets daily for 5 days  #10 x 0   Entered and Authorized by:   Towana Badger MD   Signed by:   Towana Badger MD on 07/11/2007   Method used:   Electronically sent to ...       [facility] [phone]*       [address], [place]  [address]       Ph: [phone] or [phone]       Fax: [phone]   RxID:   [id number] TESSALON PERLES 100 MG  CAPS (BENZONATATE) One tablet three times a day for cough  #21 x 0   Entered and Authorized by:   Towana Badger MD   Signed by:   Towana Badger MD on 07/11/2007   Method used:   Electronically sent to ...       [facility] [phone]*       [address], [place]  [address]       Ph: [phone] or [phone]       Fax: [phone]   RxID:   [id number] SPIRIVA HANDIHALER 18 MCG  CAPS (TIOTROPIUM BROMIDE MONOHYDRATE) Q.S. 30 days - One capsule once a day  #1 x 3   Entered and Authorized by:   Towana Badger MD   Signed by:   Towana Badger MD on 07/11/2007   Method used:   Electronically sent to ...       [facility] [phone]*       [address], [place]  [address]       Ph: [phone] or [phone]       Fax: [phone]   RxID:   [id number] CIPRO HC 0.2-1 %  SUSP (CIPROFLOXACIN-HYDROCORTISONE) 3 drops into each ear twice a day for 7 days for external ear infection  #1 x 0   Entered and Authorized by:   Towana Badger MD   Signed by:   Towana Badger MD on 07/11/2007   Method used:   Electronically sent to ...       [facility] [phone]*       [address], [place]  [address]       Ph: [phone] or [phone]       Fax: [phone]   RxID:   [id number]  ]  Pneumovax Vaccine    Vaccine Type: Pneumovax    Site: left deltoid    Mfr: Merck    Dose: 0.5 ml    Route: IM    Given by: AMY MARTIN RN    Exp. Date: 08/30/2008    Lot #: [id number]    VIS given: 01/30/96 version given July 11, 2007.  Flu Vaccine Consent Questions     Do you have a history of severe allergic reactions to this vaccine? no    Any prior history of allergic reactions to egg and/or gelatin? no    Do you have a sensitivity to the preservative Thimersol? no    Do you have a  past history of Guillan-Barre Syndrome? no    Do you currently have an acute febrile illness? no    Have you ever had a severe reaction to latex? no    Vaccine information given and explained to patient? yes    Are you currently pregnant? no    Lot Number:[id number]   Site Given Right Deltoid ..................................................................Marland KitchenAMY MARTIN RN  July 11, 2007 11:58 AM  Laboratory Results    Blood Tests   Date/Time Received: July 11, 2007 11.30 AM  Date/Time Reported: July 11, 2007 6:51 PM   HGBA1C: 5.7%   (Normal Range: Non-Diabetic - 3-6%   Control Diabetic - 6-8%)  Comments: ...............test performed by......Marland KitchenBonnie A. [place], [place] (ASCP)

## 2010-08-05 NOTE — Assessment & Plan Note (Signed)
Summary: FU/KH   Vital Signs:  Patient Profile:   60 Years Old Female Height:     62.5 inches Weight:      163 pounds Temp:     98.8 degrees F Pulse rate:   82 / minute BP sitting:   118 / 75  (left arm)  Pt. in pain?   yes    Location:   left knee    Intensity:   6    Type:       aching  Vitals Entered ByJacki Cones RN (January 10, 2007 10:29 AM)              Is Patient Diabetic? Yes    Chief Complaint:  have lab work and check cyst removal site.  History of Present Illness: Meet New patient -  Patient has been seeing Dr. Irving Burton.  She says that she is my patient, and has asked to see me.    Diabetes Management History:      She states understanding of dietary principles but she is not following the appropriate diet.  Sensory loss is noted.  Self foot exams are not being performed.  She is not checking home blood sugars.  She says that she is not exercising regularly.        Hypoglycemic symptoms are not occurring.  No hyperglycemic symptoms are reported.        Symptoms which suggest diabetic complications include paresthesias.  Since her last visit, no infections have occurred.  The following treatment plan problems are reported: financial problems.  No changes have been made to her treatment plan since last visit.    Hypertension History:      She denies headache, chest pain, palpitations, dyspnea with exertion, orthopnea, PND, peripheral edema, visual symptoms, neurologic problems, syncope, and side effects from treatment.  She notes no problems with any antihypertensive medication side effects.        Positive major cardiovascular risk factors include female age 74 years old or older, diabetes, hyperlipidemia, hypertension, family history for ischemic heart disease (females less than 27 years old), and current tobacco user.        Further assessment for target organ damage reveals no history of ASHD, cardiac end-organ damage (CHF/LVH), stroke/TIA, peripheral vascular  disease, renal insufficiency, or hypertensive retinopathy.    Lipid Management History:      Positive NCEP/ATP III risk factors include female age 69 years old or older, diabetes, family history for ischemic heart disease (females less than 1 years old), current tobacco user, and hypertension.  Negative NCEP/ATP III risk factors include no history of early menopause without estrogen hormone replacement, no ASHD (atherosclerotic heart disease), no prior stroke/TIA, no peripheral vascular disease, and no history of aortic aneurysm.        The patient states that she knows about the "Therapeutic Lifestyle Change" diet.  Her compliance with the TLC diet is poor.  The patient expresses understanding of adjunctive measures for cholesterol lowering.  Adjunctive measures started by the patient include ASA.         Past Medical History:    Reviewed history from 09/27/2006 and no changes required:       h/o etoh abuse - quit 1997-98       Tardive Dyskinesisa   Family History:    Reviewed history from 09/27/2006 and no changes required:       Brothers with DM, HTN.       Mom- h/o 4 MI`s--1st in 68`s, died  Sep 29, 2001.       sister - breast ca  Social History:    Reviewed history from 09/27/2006 and no changes required:       smokes 5cigarettes per day.  Mother died 2001-09-29.  Lives alone and has one sexual partner periodically.   Risk Factors:  Exercise:  no  Family History Risk Factors:    Family History of MI in females < 10 years old:  yes    Family History of MI in males < 28 years old:  no   Review of Systems      See HPI   Physical Exam  General:     Well-developed,well-nourished,in no acute distress; alert,appropriate and cooperative throughout examination Lungs:     Normal respiratory effort, chest expands symmetrically. Lungs are clear to auscultation, no crackles or wheezes. Heart:     Normal rate and regular rhythm. S1 and S2 normal without gallop, murmur, click, rub or  other extra sounds.  Diabetes Management Exam:    Foot Exam (with socks and/or shoes not present):       Sensory-Pinprick/Light touch:          Left medial foot (L-4): normal          Left dorsal foot (L-5): normal          Left lateral foot (S-1): normal          Right medial foot (L-4): normal          Right dorsal foot (L-5): diminished          Right lateral foot (S-1): normal       Sensory-Monofilament:          Left foot: normal          Right foot: normal       Inspection:          Left foot: normal          Right foot: normal       Nails:          Left foot: normal          Right foot: normal    Eye Exam:       Eye Exam done elsewhere          Date: 12/27/2006          Results: normal          Done by: Dr. Nelle Don    Impression & Recommendations:  Problem # 1:  HYPERTENSION, BENIGN SYSTEMIC (ICD-401.1) Assessment: Improved Well controlled today. We reviewed the medication list in [facility]. I asked patient to bring her medications.  Orders: Comp Met-[facility] ([id number])   Problem # 2:  DIABETES MELLITUS II, UNCOMPLICATED (ICD-250.00) Recommended daily glucose checks with log to be brought in. Reviewed dietary guidelines given patient's financial limitations. Stressed importance of keeping up with regular visits. Orders: UA Microalbumin-[facility] (82044) A1C-[facility] (14782)   Problem # 3:  HYPERCHOLESTEROLEMIA (ICD-272.0) Assessment: Unchanged Direct LDL due today. Reviewed medications with patient.  Orders: Comp Met-[facility] [id number]) Direct LDL-[facility] [phone])   Diabetes Management Assessment/Plan:      The following lipid goals have been established for the patient: Total cholesterol goal of 200; LDL cholesterol goal of 100; HDL cholesterol goal of 40; Triglyceride goal of 150.  Her blood pressure goal is < 130/80.    Hypertension Assessment/Plan:      The patient's hypertensive risk group is category C: Target organ damage and/or diabetes.  Her calculated  10 year risk  of coronary heart disease is 17 %.  Today's blood pressure is 118/75.  Her blood pressure goal is < 130/80.  Lipid Assessment/Plan:      Based on NCEP/ATP III, the patient's risk factor category is "history of diabetes".  From this information, the patient's calculated lipid goals are as follows: Total cholesterol goal is 200; LDL cholesterol goal is 100; HDL cholesterol goal is 40; Triglyceride goal is 150.  Her LDL cholesterol goal has been met.        The patient has triglyceride level over 150 and an HDL less than 73 (female), but she does not meet the criteria for dysmetabolic syndrome.     Patient Instructions: 1)  Please schedule a follow-up appointment in 1 month.    [facility]     Appended Document: A1C & MICROALBUMIN RESULTS    Lab Visit  Laboratory Results   Urine Tests  Date/Time Recieved: January 10, 2007 11:10 AM  Date/Time Reported: January 10, 2007 11:22 AM  Microalbumin (urine): trace mg/L    Comments: test performed by J. Senaida Ores, CMA...................................................................DONNA [facility]  January 10, 2007 11:22 AM   Blood Tests   Date/Time Recieved: January 10, 2007 11:10 AM  Date/Time Reported: January 10, 2007 11:23 AM   HGBA1C: 5.3%   (Normal Range: Non-Diabetic - 3-6%   Control Diabetic - 6-8%)  Comments: ...................................................................DONNA [facility]  January 10, 2007 11:23 AM     Orders Today:  Appended Document: Orders Update     Clinical Lists Changes  Orders: Added new Test order of [facility]- Est  Level 4 (81829) - Signed

## 2010-08-05 NOTE — Letter (Signed)
Summary: [facility]   This pt has an appt on 03/21/07 @ 9 am, please see white team box for hardcopy PT INFORMED

## 2010-08-05 NOTE — Progress Notes (Signed)
Summary: Rx Req  Phone Note Refill Request Call back at Home Phone [phone] Message from:  Patient  Refills Requested: Medication #1:  PRODIGY BLOOD GLUCOSE TEST  STRP check blood sugar up to 3 times a day Disp: 1 month supply [facility] [address]  Initial call taken by: Clydell Hakim,  April 26, 2010 10:13 AM  Follow-up for Phone Call        rx was sent to wrong pharmacy on 04/22/2010. advised patient to call her pharmacy and ask them to transfer RX over . she will do this. Follow-up by: [name],  April 26, 2010 11:23 AM

## 2010-08-05 NOTE — Progress Notes (Signed)
Summary: Refill  Phone Note Refill Request Call back at Home Phone [phone]   Refills Requested: Medication #1:  METFORMIN HCL 500 MG TABS Take 1 tablet by mouth once a day pt also needs refill on test strips for "prodigy meter", [facility] uses [facility]  Initial call taken by: Knox Royalty,  September 14, 2009 12:12 PM    New/Updated Medications: PRODIGY BLOOD GLUCOSE TEST  STRP (GLUCOSE BLOOD) check blood sugar up to 3 times a day Disp: 1 month supply Prescriptions: PRODIGY BLOOD GLUCOSE TEST  STRP (GLUCOSE BLOOD) check blood sugar up to 3 times a day Disp: 1 month supply  #1 x 11   Entered and Authorized by:   Marisue Ivan  MD   Signed by:   Marisue Ivan  MD on 09/14/2009   Method used:   Electronically to        [facility] [address] [address] [phone]* (retail)       [address]       [place], [place]  [address]       Ph: [phone]       Fax: [phone]   RxID:   [id number]   Appended Document: Refill pt notified of refills on Metformin and on new Rx at Pharm,voiced understanding

## 2010-08-05 NOTE — Progress Notes (Signed)
Summary: please call  Phone Note Call from Patient Call back at [phone]   Caller: Patient Summary of Call: she bought Alka Seltzer Cold and Cough.  Can she take this because she has DM and HBP?  she wanted to know before she took any. Initial call taken by: De Nurse,  May 20, 2008 3:59 PM  Follow-up for Phone Call        called pt. advised to ask pharmacist. read label, if it says ok for patients with HTN/DM Follow-up by: Arlyss Repress CMA,,  May 20, 2008 4:11 PM

## 2010-08-05 NOTE — Assessment & Plan Note (Signed)
Summary: vaginal burning   Vital Signs:  Patient Profile:   60 Years Old Female Height:     62.5 inches Weight:      161.5 pounds Temp:     98.4 degrees F Pulse rate:   90 / minute BP sitting:   125 / 85  Pt. in pain?   yes    Location:   back    Intensity:   8  Vitals Entered By: Altamese Dilling CMA, (Nov 20, 2006 9:24 AM)                Chief Complaint:  vag. burning.  History of Present Illness: Vaginal burning and presence of cyst at vaginal opening.  She is worried that this will cause cancer.  She had a pelvic in 1/08 with diagniosis of BV and atrophic vaginitis, was given esrogen cream and did not use because the insert mentioned cancer.  She has a boyfriend that travels and visits, he is not nice to her and she is thinking of ending relationship.      Risk Factors:  Tobacco use:  current   Review of Systems  GU      Complains of discharge.      Denies abnormal vaginal bleeding, dysuria, and genital sores.      "hard ball at vagina"   Physical Exam  Abdomen:     Bowel sounds positive,abdomen soft and non-tender without masses, organomegaly or hernias noted. Genitalia:     Hard cyctic lesion on vulva, non inflammed,  Grade 2 cyctocele, and vaginal wall thinning.  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 Shawnie Pons here at clinic for removal, bothering patient. Orders: [facility]- Est  Level 4 (52841)   Other Orders: Urinalysis-[facility] (00000) [facility]   Patient Instructions: 1)  Please schedule a follow-up appointment in 1 month with [facility]  clinic to remove and biopsy cystic lesion on vulva.  Laboratory  Results   Urine Tests  Date/Time Recieved: Nov 20, 2006 9:38 AM  Date/Time Reported: Nov 20, 2006 9:42 AM   Routine Urinalysis   Color: yellow Appearance: Clear Glucose: negative   (Normal Range: Negative) Bilirubin: negative   (Normal Range: Negative) Ketone: negative   (Normal Range: Negative) Spec. Gravity: 1.015   (Normal Range: 1.003-1.035) Blood: negative   (Normal Range: Negative) pH: 5.5   (Normal Range: 5.0-8.0) Protein: negative   (Normal Range: Negative) Urobilinogen: 0.2   (Normal Range: 0-1) Nitrite: negative   (Normal Range: Negative) Leukocyte Esterace: negative   (Normal Range: Negative)    Comments: ...................................................................DON[facility]  Nov 20, 2006 9:42 AM  Date/Time Received: Nov 20, 2006 9:57 AM  Date/Time Reported: Nov 20, 2006 10:07 AM   Wet [place]/KOH Source: vag WBC/hpf 1-5 Bacteria/hpf 3+  Cocci Clue cells/hpf many Yeast/hpf none KOH Negative Trichomonas/hpf none Comments ...............test performed by......Marland KitchenBonnie A. [place], [place] (ASCP)

## 2010-08-05 NOTE — Progress Notes (Signed)
Summary: requesting alternate rx  Phone Note Call from Patient Call back at [phone]   Reason for Call: Talk to Nurse Summary of Call: pt is calling re: her protonix, sts she just found out that medicaid doesn't pay for it anymore and she is needing an alternate medication, pt goes to [facility] Initial call taken by: Knox Royalty,  February 22, 2008 3:39 PM  Follow-up for Phone Call        message to dr.linthavong.  do you want prior authorization or different rx for this pt? Follow-up by: Arlyss Repress CMA,,  February 22, 2008 4:20 PM  Additional Follow-up for Phone Call Additional follow up Details #1::        message to md. plz change rx or let us know if pruior authorization should be done Additional Follow-up by: Golden Circle RN,  February 25, 2008 4:02 PM    Additional Follow-up for Phone Call Additional follow up Details #2::    checking status....has been without med for a week. Follow-up by: Haydee Salter,  February 26, 2008 2:04 PM  Additional Follow-up for Phone Call Additional follow up Details #3:: Details for Additional Follow-up Action Taken: Will prescribe generic version. Additional Follow-up by: Marisue Ivan  MD,  February 27, 2008 8:19 PM  New/Updated Medications: [facility] OMEPRAZOLE 20 MG TBEC (OMEPRAZOLE) 2 tablets by mouth daily   Prescriptions: [facility] OMEPRAZOLE 20 MG TBEC (OMEPRAZOLE) 2 tablets by mouth daily  #60 x 3   Entered and Authorized by:   Marisue Ivan  MD   Signed by:   Marisue Ivan  MD on 02/27/2008   Method used:   Electronically to        [facility] [address] [address] [phone]* (retail)       [address]       [place], [place]  [address]       Ph: [phone]       Fax: [phone]   RxID:   [id number]

## 2010-08-05 NOTE — Assessment & Plan Note (Signed)
Summary: DM & chronic bronchitis f/u    PCP:  Marisue Ivan  MD  Chief Complaint:  DM f/u.  History of Present Illness: 60yo AAF here to f/u DM II and chronic bronchitis.  DM II: Currently taking Metformin 500mg  two times a day.  She was initially doing once a day but another physician advised her to take it twice a day.  States that her CBGs are typically controlled but she had one elevated CBG in the 400s 2/2 to eating dessert.  Otherwise, denies any symptoms from her DM.  Last A1c 6.2% 8/09.  Chronic bronchitis: Breathing better today.  No wheezing.  No cough.  No sputum.  No fevers.  Has not been on steroids recently.    Tobacco use: Cont to smoke less <1/2ppd without desire to quit today.      Current Allergies: * CDN LISINOPRIL (LISINOPRIL)     Review of Systems      See HPI   Physical Exam  General:     Well appearing, nad Lungs:     Normal respiratory effort, chest expands symmetrically. Lungs are clear to auscultation, no crackles or wheezes. Heart:     Normal rate and regular rhythm. S1 and S2 normal without gallop, murmur, click, rub or other extra sounds. Abdomen:     Bowel sounds positive,abdomen soft and non-tender without masses, organomegaly or hernias noted. Extremities:     no edema Psych:     Remains agitated at times...but pleasant  Diabetes Management Exam:    Foot Exam (with socks and/or shoes not present):       Sensory-Pinprick/Light touch:          Left medial foot (L-4): normal          Left dorsal foot (L-5): normal          Left lateral foot (S-1): normal          Right medial foot (L-4): normal          Right dorsal foot (L-5): normal          Right lateral foot (S-1): normal       Sensory-Monofilament:          Left foot: normal          Right foot: normal       Inspection:          Left foot: normal          Right foot: normal       Nails:          Left foot: normal          Right foot: normal    Impression &  Recommendations:  Problem # 1:  DIABETES MELLITUS II, UNCOMPLICATED (ICD-250.00) Assessment: Unchanged Good control.  A1c 5.7%.  No need to have her continue on Metformin two times a day.  Will back off to once a day.  Cont on ASA 81mg  for preventative care.     Her updated medication list for this problem includes:    Bayer Childrens Aspirin 81 Mg Chew (Aspirin) .Marland Kitchen... Take 1 tablet by mouth once a day    Metformin Hcl 500 Mg Tabs (Metformin hcl) .Marland Kitchen... Take 1 tablet by mouth once a day  Orders: A1C-[facility] ([address]) [facility]- Est  Level 4 ([address]454)   Problem # 2:  BRONCHITIS, CHRONIC (ICD-491.9) Assessment: Improved Currently asymptomatic.  Pt continues to request steroid therapy.  Each time, she is not wheezing with nl O2 sats at  that time, so I don't recommend using it if not necessary.  Pt has moderate restriction on spirometry 8/09.  Pt never followed through with CXR to find possible etiology of restrictive lungs.  Encouraged to quit smoking.    Orders: [facility]- Est  Level 4 ([address])   Problem # 3:  TOBACCO DEPENDENCE (ICD-305.1) Assessment: Unchanged Pt not ready to quit.  Will notify me when she is ready.  Counseling provided AGAIN.  Complete Medication List: 1)  Albuterol 90 Mcg/act Aers (Albuterol) .... Inhale 2 puff using inhaler every four hours 2)  Ativan 0.5 Mg Tabs (Lorazepam) .... Take 1 tablet by mouth twice a day 3)  Bayer Childrens Aspirin 81 Mg Chew (Aspirin) .... Take 1 tablet by mouth once a day 4)  Crestor 40 Mg Tabs (Rosuvastatin calcium) .... Take 1 tablet by mouth once a day 5)  Depakote 250 Mg Tbec (Divalproex sodium) .... Take four times daily 6)  Hydrochlorothiazide 25 Mg Tabs (Hydrochlorothiazide) .... Take 1 tablet by mouth every morning 7)  Metformin Hcl 500 Mg Tabs (Metformin hcl) .... Take 1 tablet by mouth once a day 8)  Nasacort Aq 55 Mcg/act Aers (Triamcinolone acetonide(nasal)) .... Spray 2 spray into both nostrils once a day 9)  Norvasc 5 Mg Tabs (Amlodipine  besylate) .... Take 1 tablet by mouth once a day 10)  Protonix 40 Mg Tbec (Pantoprazole sodium) .... One tablet by mouth two times a day 11)  Risperdal 2 Mg Tabs (Risperidone) .... Take 1 tablet by mouth at bedtime 12)  Synthroid 75 Mcg Tabs (Levothyroxine sodium) .... Take 1 tablet by mouth once a day   Patient Instructions: 1)  Please schedule a follow-up appointment in 2 months. 2)  Today we checked your A1c to assess your diabetes.  I will call you with the results and discuss the medication.     Prescriptions: ALBUTEROL 90 MCG/ACT AERS (ALBUTEROL) Inhale 2 puff using inhaler every four hours  #1 x 1   Entered and Authorized by:   Marisue Ivan  MD   Signed by:   Marisue Ivan  MD on 08/26/2008   Method used:   Electronically to        [facility] [address] [phone]* (retail)       [address]       [place], [place]  [address]       Ph: [phone]       Fax: [phone]   RxID:   [id number]      Laboratory Results   Blood Tests   Date/Time Received: August 26, 2008 2:44 PM  Date/Time Reported: August 26, 2008 2:59 PM   HGBA1C: 5.7%   (Normal Range: Non-Diabetic - 3-6%   Control Diabetic - 6-8%)  Comments: ..............test performed by Maree Erie (SMA)...... ...........verified by Dewitt Hoes, [place](ASCP)........Marland Kitchen

## 2010-08-05 NOTE — Progress Notes (Signed)
Summary: Requesting Prescription for Vicodin  Phone Note Call from Patient Call back at Home Phone [phone]   Caller: Patient Summary of Call: Patient calling to request prescription for Vicodin.  States orthopedic doctor did not refill it in time and she is out and her pharmacy advised trying to call her medical doctor to see if they could fill it.  Advised patient that it is practice policy not to phone in prescriptions via the after hours line, particularly for pain medications.  Patient voiced understanding.  Advised patient she may call Monday to see if her Primary MD is willing to fill her Vicodin prescription. Initial call taken by: Drue Dun MD,  March 17, 2007 7:22 PM

## 2010-08-05 NOTE — Assessment & Plan Note (Signed)
Summary: ear pain/[place]/linthavong   Vital Signs:  Patient profile:   60 year old female Weight:      155.6 pounds Temp:     97.4 degrees F oral Pulse rate:   64 / minute BP sitting:   110 / 70  (left arm)  Vitals Entered By: Alphia Kava (March 10, 2009 10:31 AM) CC: lt ear pain Is Patient Diabetic? Yes   Primary Care Provider:  Marisue Ivan  MD  CC:  lt ear pain.  History of Present Illness: Pt comes in complaining of left ear pain and fulness. Pt has had some coughing on and off all summer (and is coughing in the clinic today). Pt is not having sore throat or fevers or chills. She does mention that the last time she was in the clinic she had wax in her ears that she has tried to use a wax dissolver to get out. She feels like her head is swimming and that she has "fluid behind her ears".   Habits & Providers  Alcohol-Tobacco-Diet     Tobacco Status: current  Current Medications (verified): 1)  Albuterol 90 Mcg/act Aers (Albuterol) .... Inhale 2 Puff Using Inhaler Every Four Hours 2)  Ativan 0.5 Mg Tabs (Lorazepam) .... Take 1 Tablet By Mouth Twice A Day 3)  Bayer Childrens Aspirin 81 Mg Chew (Aspirin) .... Take 1 Tablet By Mouth Once A Day 4)  Crestor 40 Mg Tabs (Rosuvastatin Calcium) .... Take 1 Tablet By Mouth Once A Day 5)  Depakote 250 Mg Tbec (Divalproex Sodium) .... Take Four Times Daily 6)  Hydrochlorothiazide 25 Mg Tabs (Hydrochlorothiazide) .... Take 1 Tablet By Mouth Every Morning 7)  Metformin Hcl 500 Mg Tabs (Metformin Hcl) .... Take 1 Tablet By Mouth Once A Day 8)  Nasacort Aq 55 Mcg/act Aers (Triamcinolone Acetonide(Nasal)) .... Spray 2 Spray Into Both Nostrils Once A Day 9)  Norvasc 5 Mg Tabs (Amlodipine Besylate) .... Take 1 Tablet By Mouth Once A Day 10)  Protonix 40 Mg Tbec (Pantoprazole Sodium) .... One Tablet By Mouth Two Times A Day 11)  Risperdal 2 Mg Tabs (Risperidone) .... Take 1 Tablet By Mouth At Bedtime 12)  Synthroid 75 Mcg Tabs  (Levothyroxine Sodium) .... Take 1 Tablet By Mouth Once A Day For Hypothyroidism 13)  Tylenol Arthritis Pain 650 Mg Cr-Tabs (Acetaminophen) .... One Tablet By Mouth Three Times A Day For Arthritis (Medically Necessary) 14)  Tessalon Perles 100 Mg Caps (Benzonatate) .... Take One Pill 3 Times A Day As Needed For Coughing.  Allergies (verified): 1)  * Cdn 2)  Lisinopril (Lisinopril)  Past History:  Social History: Last updated: 09/27/2006 smokes 5cigarettes per day.  Mother died Sep 23, 2001.  Lives alone and has one sexual partner periodically.  Social History: Smoking Status:  current  Review of Systems        vitals reviewed and pertinent negatives and positives seen in HPI   Physical Exam  General:  Well-developed,well-nourished,in no acute distress; alert,appropriate and cooperative throughout examination Ears:  Left ear has wax covering most of the TM but what  I could see was not erythematous. Rt ear TM was bulging w/o erythema.  Mouth:  Oral mucosa and oropharynx without lesions or exudates.  Teeth in good repair. Neck:  No deformities, masses, or tenderness noted. Lungs:  Normal respiratory effort, chest expands symmetrically. Lungs are clear to auscultation, no crackles or wheezes. Heart:  Normal rate and regular rhythm. S1 and S2 normal without gallop, murmur, click, rub or  other extra sounds. Skin:  Intact without suspicious lesions or rashes Cervical Nodes:  No lymphadenopathy noted   Impression & Recommendations:  Problem # 1:  EAR PAIN, LEFT (ICD-388.70) Assessment Deteriorated  Left ear pain with Rt ear bulging. Appears to have an OM. Plan to treat with Amoxicillin for 7 days. Pt has non-productive cough. Likely related to smoking or chronic bronchitis (which the pt describes having).   Orders: [facility]- Est Level  3 ([address])  Her updated medication list for this problem includes:    Trimox 500 Mg Caps (Amoxicillin) .Marland Kitchen... Take one pill every 12 hours  Complete  Medication List: 1)  Albuterol 90 Mcg/act Aers (Albuterol) .... Inhale 2 puff using inhaler every four hours 2)  Ativan 0.5 Mg Tabs (Lorazepam) .... Take 1 tablet by mouth twice a day 3)  Bayer Childrens Aspirin 81 Mg Chew (Aspirin) .... Take 1 tablet by mouth once a day 4)  Crestor 40 Mg Tabs (Rosuvastatin calcium) .... Take 1 tablet by mouth once a day 5)  Depakote 250 Mg Tbec (Divalproex sodium) .... Take four times daily 6)  Hydrochlorothiazide 25 Mg Tabs (Hydrochlorothiazide) .... Take 1 tablet by mouth every morning 7)  Metformin Hcl 500 Mg Tabs (Metformin hcl) .... Take 1 tablet by mouth once a day 8)  Nasacort Aq 55 Mcg/act Aers (Triamcinolone acetonide(nasal)) .... Spray 2 spray into both nostrils once a day 9)  Norvasc 5 Mg Tabs (Amlodipine besylate) .... Take 1 tablet by mouth once a day 10)  Protonix 40 Mg Tbec (Pantoprazole sodium) .... One tablet by mouth two times a day 11)  Risperdal 2 Mg Tabs (Risperidone) .... Take 1 tablet by mouth at bedtime 12)  Synthroid 75 Mcg Tabs (Levothyroxine sodium) .... Take 1 tablet by mouth once a day for hypothyroidism 13)  Tylenol Arthritis Pain 650 Mg Cr-tabs (Acetaminophen) .... One tablet by mouth three times a day for arthritis (medically necessary) 14)  Tessalon Perles 100 Mg Caps (Benzonatate) .... Take one pill 3 times a day as needed for coughing. 15)  Trimox 500 Mg Caps (Amoxicillin) .... Take one pill every 12 hours  Patient Instructions: 1)  I think you have an inner ear infection.  2)  Take Amoxicillin for 7 days twice daily for ear infection. 3)  Take Tessalon perrles for the coughing.  4)  Move up appointment with Dr. Burnadette Pop to check on the vaginal issue.  Prescriptions: TRIMOX 500 MG CAPS (AMOXICILLIN) take one pill every 12 hours  #14 x 0   Entered and Authorized by:   Jamie Brookes MD   Signed by:   Jamie Brookes MD on 03/10/2009   Method used:   Handwritten   RxID:   [id number] TESSALON PERLES 100 MG CAPS  (BENZONATATE) take one pill 3 times a day as needed for coughing.  #90 x 2   Entered and Authorized by:   Jamie Brookes MD   Signed by:   Jamie Brookes MD on 03/10/2009   Method used:   Electronically to        [facility] [phone]* (retail)       [address]       [place], [place]  [address]       Ph: [phone]       Fax: [phone]   RxID:   [id number]

## 2010-08-05 NOTE — Letter (Signed)
Summary: [facility] Lipid Letter  [facility]  [address]   [place], [place] [address]   Phone: [phone]  Fax:     09/28/2006 MRN: [id number]  [name] Long [address] [place], [place]  [address]  Dear [name]:  Your lab results from 09/27/2006 are as follows:    Cholesterol:       225     Goal: <200   HDL "good" Cholesterol:   40     Goal: >40   LDL "bad" Cholesterol:   131     Goal: <100   Triglycerides:       269     Goal: <150    TLC Diet (Therapeutic Lifestyle Change): Saturated Fats & Transfatty acids should be kept < 7% of total calories ***Reduce Saturated Fats Polyunstaurated Fat can be up to 10% of total calories Monounsaturated Fat Fat can be up to 20% of total calories Total Fat should be no greater than 25-35% of total calories Carbohydrates should be 50-60% of total calories Protein should be approximately 15% of total calories Fiber should be at least 20-30 grams a day ***Increased fiber may help lower LDL Total Cholesterol should be < 200mg /day Consider adding plant stanol/sterols to diet (example: Benacol spread) ***A higher intake of unsaturated fat may reduce Triglycerides and Increase HDL    Other Measures that may lower cholesterol and reduce risk of Heart Attack include: Aerobic Exercise (20-30 minutes 3-4 times a week) Limit Alcohol Consumption Weight Reduction Aspirin 75-81 mg a day by mouth (if not allergic or contraindicated) Folic Acid 1mg  a day by mouth Dietary Fiber 20-30 grams a day by mouth  Other Tests: Blood count (Hemoglobin):  14.7 (Normal) Liver Function:      Normal Thyroid (TSH):     1.014 (Normal) Depakote Level:     37.3 (Normal for treatment of bipolar) HIV:         Normal Syphilis test (RPR):     Normal   I think we should start you on another medicine for cholesterol.  It is called Zetia.  I have prescribed it to your pharmacy through the computer.  You should be able to pick it up after you  get this letter.  If you have any questions, please call. We appreciate being able to work with you.   Sincerely,   Angeline Slim MD [facility] JOSEPH PYE MD     Appended Document: [facility] Lipid Letter Recommended/e-prescribed Zetia 10mg  daily to currrent regimen since her LDL is 130 and over her goal of 100.  -APL

## 2010-08-05 NOTE — Miscellaneous (Signed)
Summary: ROI  ROI   Imported By: Knox Royalty 11/21/2007 11:47:52  _____________________________________________________________________  External Attachment:    Type:   Image     Comment:   External Document

## 2010-08-05 NOTE — Progress Notes (Signed)
Summary: [place] request  Phone Note Call from Patient Call back at Home Phone [phone]   Summary of Call: pt wants to be seen for female problems she is having Initial call taken by: Haydee Salter,  Nov 16, 2006 1:39 PM  Follow-up for Phone Call        c/o vaginal burning. states it is like the bacterial infections she has had before. has transportation problems. wants monday appt. appt scheduled Follow-up by: Golden Circle RN,  Nov 16, 2006 1:44 PM

## 2010-08-05 NOTE — Assessment & Plan Note (Signed)
Summary: Flu vaccine given  Nurse Visit   Vital Signs:  Patient profile:   60 year old female Temp:     97.6 degrees F  Vitals Entered By: [name] (June 02, 2009 11:47 AM)  Allergies: 1)  * Cdn 2)  Lisinopril (Lisinopril)  Immunizations Administered:  Influenza Vaccine # 1:    Vaccine Type: Fluvax Non-MCR    Site: left deltoid    Mfr: GlaxoSmithKline    Dose: 0.5 ml    Route: IM    Given by: [name]    Exp. Date: 12/31/2009    Lot #: [id number]    VIS given: 02/10/2009  Flu Vaccine Consent Questions:    Do you have a history of severe allergic reactions to this vaccine? no    Any prior history of allergic reactions to egg and/or gelatin? no    Do you have a sensitivity to the preservative Thimersol? no    Do you have a past history of Guillan-Barre Syndrome? no    Do you currently have an acute febrile illness? no    Have you ever had a severe reaction to latex? no    Vaccine information given and explained to patient? yes    Are you currently pregnant? no  Orders Added: 1)  Influenza Vaccine NON MCR [00028] 2)  Administration Flu vaccine - MCR [G0008]     Vital Signs:  Patient profile:   60 year old female Temp:     97.6 degrees F  Vitals Entered By: [name] (June 02, 2009 11:47 AM)  Appended Document: Flu vaccine given

## 2010-08-05 NOTE — Assessment & Plan Note (Signed)
Summary: sob & wheezy   Vital Signs:  Patient Profile:   60 Years Old Female Height:     62.5 inches Weight:      156.8 pounds O2 Sat:      99 % Temp:     98.4 degrees F Pulse rate:   97 / minute BP sitting:   110 / 78  (left arm)  Pt. in pain?   yes    Location:   joints generalized    Intensity:   8    Type:       aching  Vitals Entered By: [name] (August 12, 2008 1:32 PM)              Is Patient Diabetic? Yes     PCP:  Marisue Ivan  MD  Chief Complaint:  wheezing, cough, and needs coloscopy rescheduled.  History of Present Illness: 60 yo female presents as work in with multiple complaints, stating that she can never keep her appointments so we need to take care of all of her issues today.  1.  cough/wheezing/sob- started last week.  Dry cough, afebrile.  Wheezing on occassion.  Just picked up her albuterol yesterday and has only used it once.  Is demanding prednisone.  Also wants me to fax nebulizer RX to homehealth ( only prescription in [place] is for inhaler).  Continues to smoke 1/2 ppd.  2.  Missed colonoscopy- did not keep colonoscopy appointment and would like [place] to reschedule for her today.  3.  Stomach pains- pt is very angry and repeated tells me that I just want more money when I ask her to make an appointment with her primary to discuss as we only have time for one acute issue today.    Updated Prior Medication List: ALBUTEROL 90 MCG/ACT AERS (ALBUTEROL) Inhale 2 puff using inhaler every four hours ATIVAN 0.5 MG TABS (LORAZEPAM) Take 1 tablet by mouth twice a day BAYER CHILDRENS ASPIRIN 81 MG CHEW (ASPIRIN) Take 1 tablet by mouth once a day CRESTOR 40 MG TABS (ROSUVASTATIN CALCIUM) Take 1 tablet by mouth once a day DEPAKOTE 250 MG TBEC (DIVALPROEX SODIUM) Take four times daily HYDROCHLOROTHIAZIDE 25 MG TABS (HYDROCHLOROTHIAZIDE) Take 1 tablet by mouth every morning METFORMIN HCL 500 MG TABS (METFORMIN HCL) Take 1 tablet by mouth once a  day NASACORT AQ 55 MCG/ACT AERS (TRIAMCINOLONE ACETONIDE(NASAL)) Spray 2 spray into both nostrils once a day NORVASC 5 MG TABS (AMLODIPINE BESYLATE) Take 1 tablet by mouth once a day PROTONIX 40 MG TBEC (PANTOPRAZOLE SODIUM) one tablet by mouth two times a day RISPERDAL 2 MG TABS (RISPERIDONE) Take 1 tablet by mouth at bedtime SYNTHROID 75 MCG TABS (LEVOTHYROXINE SODIUM) Take 1 tablet by mouth once a day  Current Allergies (reviewed today): * CDN LISINOPRIL (LISINOPRIL)    Risk Factors:     Counseled to quit/cut down tobacco use:  yes   Review of Systems      See HPI   Physical Exam  General:     Overweight, AAF,with multiple complaints, NAD, no increased work of breathing. Lungs:     Normal respiratory effort, chest expands symmetrically. Lungs are clear to auscultation, NO crackles or wheezes. Heart:     Normal rate and regular rhythm. S1 and S2 normal without gallop, murmur, click, rub or other extra sounds. Psych:     Pan positive of all symptoms; very angry    Impression & Recommendations:  Problem # 1:  COUGH (ICD-786.2) Assessment: New Has  chronic bronchitis with restriction lung disease.  Lungs sound clear, no wheezing and sating 99% on room air with no distress.  I did not give her a prescription for prednisone and explained that it is not medically necessary.  Also advised strongly that she would feel better if she quit smoking.  She left very angry saying that we only want more money from her. Orders: Pulse Oximetry- [facility] (94760) [facility]- Est Level  3 (93235)   Complete Medication List: 1)  Albuterol 90 Mcg/act Aers (Albuterol) .... Inhale 2 puff using inhaler every four hours 2)  Ativan 0.5 Mg Tabs (Lorazepam) .... Take 1 tablet by mouth twice a day 3)  Bayer Childrens Aspirin 81 Mg Chew (Aspirin) .... Take 1 tablet by mouth once a day 4)  Crestor 40 Mg Tabs (Rosuvastatin calcium) .... Take 1 tablet by mouth once a day 5)  Depakote 250 Mg Tbec (Divalproex  sodium) .... Take four times daily 6)  Hydrochlorothiazide 25 Mg Tabs (Hydrochlorothiazide) .... Take 1 tablet by mouth every morning 7)  Metformin Hcl 500 Mg Tabs (Metformin hcl) .... Take 1 tablet by mouth once a day 8)  Nasacort Aq 55 Mcg/act Aers (Triamcinolone acetonide(nasal)) .... Spray 2 spray into both nostrils once a day 9)  Norvasc 5 Mg Tabs (Amlodipine besylate) .... Take 1 tablet by mouth once a day 10)  Protonix 40 Mg Tbec (Pantoprazole sodium) .... One tablet by mouth two times a day 11)  Risperdal 2 Mg Tabs (Risperidone) .... Take 1 tablet by mouth at bedtime 12)  Synthroid 75 Mcg Tabs (Levothyroxine sodium) .... Take 1 tablet by mouth once a day
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
## 2010-08-05 NOTE — Letter (Signed)
Summary: Results Follow-up Letter  [facility]  [address]   [place], [place] [address]   Phone: [phone]  Fax: [phone]    05/21/2010  [address] [place], [place]  [address]  Dear Ms. MCELWAIN,   The following are the results of your recent test(s):  The cards we sent you back with to test for blood in your stool came back normal.  We will either have to repeat this test in one year or get you in for a colonoscopy sometime next year.  Sincerely,  Majel Homer MD [facility]           Appended Document: Results Follow-up Letter letter mailed

## 2010-08-05 NOTE — Assessment & Plan Note (Signed)
Summary: Office visit - earache   Vital Signs:  Patient Profile:   60 Years Old Female Height:     62.5 inches Weight:      167 pounds O2 Sat:      97 % Temp:     97.8 degrees F Pulse rate:   87 / minute BP sitting:   117 / 81  (left arm)  Pt. in pain?   yes    Location:   generalized    Intensity:   8    Type:       stabbing  Vitals Entered By:  [name] 04/02/07              Is Patient Diabetic? Yes      Serial Vital Signs/Assessments:                                PEF    PreRx  PostRx Time      O2 Sat  O2 Type     L/min  L/min  L/min   By 11:32 AM97  %   Room air                          [name] SMITH RN  Comments: 11:32  04/02/07 peak flow   200,  240  ,   250 By: [name]    Chief Complaint:  bilateral ear discomfort, congestion, and and increased cough.  History of Present Illness: 60 yo AAf with hx of recurrent ear infections, COPD c/o  1- Bilateral ear pain: L >R . L ear pain x 4 months and R ear x 1 wk. NO purulent secretions.  2- INcreased cough: c/o subjective fever, 4 days hx of productive , non bloody cough, nasal congestion. NO ST , sinus pain, vomiting , diarrhea , abd. pain, sick contacts.  Pt has hx of COPD . She is not using inhalers as directed. Last COPD exacerbation in 1999 ( admitted x 2 days, no intubation). [place] smokes 1ppd. MIld SOB with exercise ( walking 1/2 a block) now worsened for the last 4 days. She wakes up at night coughing but no SOB/ increased WOB  Current Allergies: * CDN LISINOPRIL (LISINOPRIL)      Physical Exam  General:     alert, well-hydrated, and cooperative to examination.   Eyes:     no conjuctival injection Ears:     NO external defomities. R tragus tender, R canal inflamed. L canal inflamed. TM's are wnl bil.  Nose:     mucosal erythema and mucosal edema.  Clear nasal discharge. No sinus tenderness Mouth:     good dentition, pharynx pink and moist, and no exudates.   Neck:  No deformities, masses, or tenderness noted. Lungs:      Overall decreased BS through out. RLL crackles ( unchanged with cough). No wheezes, ronchi. normal respiratory effort, no intercostal retractions, and no accessory muscle use.   Heart:     Normal rate and regular rhythm. S1 and S2 normal without gallop, murmur, click, rub or other extra sounds. Abdomen:     Bowel sounds positive,abdomen soft and non-tender without masses, organomegaly or hernias noted. Pulses:     2 + peripheral pulses Extremities:     no edema Neurologic:     alert & oriented X3.   Skin:     Intact without suspicious lesions or  rashes Psych:     normally interactive and good eye contact.      Impression & Recommendations:  Problem # 1:  EAR PAIN, BILATERAL (ICD-388.70) Bilateral otitis externa. Cirpro HC : 3 drops in each ear two times a day x  7 days. F/u with pcp.   Problem # 2:  COUGH (ICD-786.2) Cough most likely 2 to acute on chronic bronchitis in this pt who smokes 1 ppd for several years and is not compliant with meds. Given this pt subjective fevers,  fixed crackles on  PE , and worsening of cough for the last 4 dyas , can not r/o PNA/ COPD exacerbation . C-x ray 2 views is pending.  Pt is clinically stable. O2 sats are 97 % on RA. Peak flows : 200, 240, 250. THerefore out patient management with : zithromax/ prednisone  x 5 days. I will prescribe atrovent INH qid and abuterol as needed as per [facility]ack pulmonology recs ( see last pulmonology OV). Although givcen this pt's poor compliance , I will consider treating COPD with Spiriva ( just once a day tx). She will also need B agonist/ steroids inhalers combo. I will f/u chest x-ray. Pt is to f/u with pcp in 1-2 wks. Tobacco cessation discussed but pt is not interested.   Complete Medication List: 1)  Albuterol 90 Mcg/act Aers (Albuterol) .... Inhale 2 puff using inhaler every four hours 2)  Ativan 0.5 Mg Tabs (Lorazepam) .... Take 1 tablet by mouth  twice a day 3)  Bayer Childrens Aspirin 81 Mg Chew (Aspirin) .... Take 1 tablet by mouth once a day 4)  Crestor 40 Mg Tabs (Rosuvastatin calcium) .... Take 1 tablet by mouth once a day 5)  Depakote 250 Mg Tbec (Divalproex sodium) .... Take 6)  Flovent Hfa 220 Mcg/act Aero (Fluticasone propionate  hfa) .... Inhale 1 puff using inhaler twice a day 7)  Hydrochlorothiazide 25 Mg Tabs (Hydrochlorothiazide) .... Take 1 tablet by mouth every morning 8)  Metformin Hcl 500 Mg Tabs (Metformin hcl) .... Take 1 tablet by mouth once a day 9)  Nasacort Aq 55 Mcg/act Aers (Triamcinolone acetonide(nasal)) .... Spray 2 spray into both nostrils once a day 10)  Norvasc 5 Mg Tabs (Amlodipine besylate) .... Take 1 tablet by mouth once a day 11)  Optichamber Advantage Misc (Spacer/aero-holding chambers) 12)  Protonix 40 Mg Tbec (Pantoprazole sodium) .[name] Long.. 1 tablet by mouth once a day 13)  Risperdal 2 Mg Tabs (Risperidone) .... Take 1 tablet by mouth at bedtime 14)  Synthroid 75 Mcg Tabs (Levothyroxine sodium) .... Take 1 tablet by mouth once a day 15)  Zetia 10 Mg Tabs (Ezetimibe) .[name] Long.. 1 tablet by mouth once a day      Chief Complaint:  bilateral ear discomfort, congestion, and and increased cough.  History of Present Illness: 60 yo AAf with hx of recurrent ear infections, COPD c/o  1- Bilateral ear pain: L >R . L ear pain x 4 months and R ear x 1 wk. NO purulent secretions.  2- INcreased cough: c/o subjective fever, 4 days hx of productive , non bloody cough, nasal congestion. NO ST , sinus pain, vomiting , diarrhea , abd. pain, sick contacts.  Pt has hx of COPD . She is not using inhalers as directed. Last COPD exacerbation in 1999 ( admitted x 2 days, no intubation). [place] smokes 1ppd. MIld SOB with exercise ( walking 1/2 a block) now worsened for the last 4 days. She wakes up at night coughing  but no SOB/ increased WOB  ]  Vital Signs:  Patient Profile:   60 Years Old Female Height:     62.5  inches Weight:      167 pounds O2 Sat:      97 % Temp:     97.8 degrees F Pulse rate:   87 / minute BP sitting:   117 / 81    Location:   generalized    Intensity:   8    Type:       stabbing

## 2010-08-05 NOTE — Progress Notes (Signed)
Summary: Triage  Phone Note Call from Patient Call back at [phone]   Reason for Call: Talk to Nurse Summary of Call: Pt would like to be seen this afternoon for a cough. Initial call taken by: Haydee Salter,  January 23, 2008 2:35 PM  Follow-up for Phone Call        Appt scheduled with Dr. Burnadette Pop for Friday.  Patient was willing to wait until then. Follow-up by: Dennison Nancy RN,  January 23, 2008 3:50 PM

## 2010-08-05 NOTE — Assessment & Plan Note (Signed)
Summary: bronchitis/ritch/per [facility]   Vital Signs:  Patient profile:   60 year old female Height:      62.5 inches Weight:      152 pounds BMI:     27.46 O2 Sat:      97 % on Room air Temp:     98.2 degrees F oral BP sitting:   102 / 74  (left arm) Cuff size:   regular  Vitals Entered By: Jimmy Footman, CMA (March 05, 2010 11:17 AM)  O2 Flow:  Room air CC: bronchitis, med refill Is Patient Diabetic? Yes   Primary Care Provider:  Majel Homer MD  CC:  bronchitis and med refill.  History of Present Illness: CC:  trouble breathing and abdominal pain  1.  Dyspnea:   Patient states she has had increasing shortness of breath for past 3 months.  Has not been seen before now due to financial issues and constant court dates for daughters.  Reports increased cough productive of green sputum, wheezing worsened at night, dyspnea on exertion and occasionally at rest.  No chest pain.  Has been out of Albuterol inhalers for at least 2 months if not longer.  2.  Abdominal pain:  worsening.  Describes as dull aching pain, occasionally sharp.  Worsened with EtOH use, though denies any recent use for past several weeks.  No vomiting since stopping EtOH.  Some relief with antacids, though not usually.  Does no feel PPI has helped.  No relief with bowel movements.    ROS:  no headaches, pre-syncopal or syncopal episodes, palpitations,  diarrhea or constipation, melena, hematochezia, lower extremity swelling.    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  (ICD-280.9)  Current Medications (verified): 1)  Proventil Hfa 108 (90 Base) Mcg/act Aers (Albuterol Sulfate) .... 2 Puffs Q4h As Needed For Wheezing 2)  Ativan 0.5 Mg Tabs (Lorazepam) .... Take 1 Tablet By Mouth Twice A Day 3)  Bayer Childrens Aspirin 81 Mg Chew (Aspirin) .... Take 1 Tablet By Mouth Once A Day 4)  Crestor 40 Mg Tabs (Rosuvastatin Calcium) .... Take 1 Tablet By Mouth Once A Day For High Cholesterol 5)  Depakote 250 Mg Tbec (Divalproex Sodium) .... Take Four Times Daily 6)  Hydrochlorothiazide 25 Mg Tabs (Hydrochlorothiazide) .... Take 1 Tablet By Mouth Daily For Blood Pressure 7)  Metformin Hcl 500 Mg Tabs (Metformin Hcl) .... Take 1 Tablet By Mouth Once A Day 8)  Nasacort Aq 55 Mcg/act Aers (Triamcinolone Acetonide(Nasal)) .... Spray 2 Spray Into Both Nostrils Once A Day 9)  Norvasc 5 Mg Tabs (Amlodipine Besylate) .... Take 1 Tablet By Mouth Once A Day 10)  Omeprazole 40 Mg Cpdr (Omeprazole) .... One  Tablet By Mouth Two Times A Day As Needed For Reflux Symptoms 11)  Risperdal 2 Mg Tabs (Risperidone) .... Take 1 Tablet By Mouth At Bedtime 12)  Synthroid 75 Mcg Tabs (Levothyroxine Sodium) .... Take 1 Tablet By Mouth Once A Day For Hypothyroidism 13)  Prodigy Blood Glucose Test  Strp (Glucose Blood) .... Check Blood Sugar Up To 3 Times A Day Disp: 1 Month Supply 14)  Prednisone 20 Mg Tabs (Prednisone) .... Take 2 Pills Daily For 5 Days Then Stop 15)  Doxycycline Hyclate 100 Mg Caps (Doxycycline Hyclate) .... Take 1 Tab By Mouth Two Times A Day X 7 Days  Allergies  (verified): 1)  * Cdn 2)  Lisinopril (Lisinopril)  Past History:  Past medical, surgical, family and social histories (including risk factors) reviewed, and no changes noted (except as noted below).  Past Medical History: Reviewed history from 09/11/2009 and no changes required. HTN DM  HLD Tobacco use Arthritis GERD Hx of bipolar d/o  Past Surgical History: Reviewed history from 09/11/2009 and no changes required. Bilat Knee surgeries - 07/19/2001  Family History: Reviewed history from 09/27/2006 and no changes required. Brothers with DM, HTN. Mom- h/o 4 MI`s--1st in 96`s, died 2001-10-06. sister - breast ca  Social History: Reviewed history from 09/11/2009 and no changes required.  Lives alone. smokes 5 cigarettes per day.   drinks occasional EtOH (hx of abuse) Best Contact # [phone] (home)Packs/Day:  0.25  Physical Exam  General:  Vital signs reviewed. Well-developed, well-nourished patient in NAD.  Awake and cooperative  Neck:  Supple, full range of motion, no goiters or cervical lymphadenopathy noted  Lungs:  diffuse expiratory wheezing mostly in bilateral lower bases of lungs.  No crackles Heart:  Regular rate and rhythm without murmur, rub, or gallop.  Normal S1/S2  Abdomen:  diffusely tender, though more so in RUQ and epigastric area.  Normal bowel sounds.     Impression & Recommendations:  Problem # 1:  CHRONIC OBSTRUCTIVE PULMONARY DISEASE, ACUTE EXACERBATION (ICD-491.21) Assessment New Plan for usual treatment of acute exacerbation:  oral steroids, refill albuterol, and antibiotics.  Discussed with patient controller medications, she has been on spiriva and advair in the past but states they "did not agree with her."  Will need some long-term control in future, more likely inhaled anticholinergics for COPD.   Orders: [facility]- Est Level  3 (45409)  Problem # 2:  ABDOMINAL PAIN, CHRONIC (ICD-789.00) Assessment: Deteriorated Worsening.  On review of chart,  this is long-time problem for this patient.  Abdominal US has been discussed in past.  Do not believe this is gallstone issue, but possibly subacute to chronic pancreatitis.  Need to rule out pseudocyst in long-time alcohol user/abuser.  Cannot follow-up next week b/c daughter will be in court, to follow up week after.   Orders: [facility]- Est Level  3 ([id number]) Ultrasound (Ultrasound)  Complete Medication List: 1)  Proventil Hfa 108 (90 Base) Mcg/act Aers (Albuterol sulfate) .... 2 puffs q4h as needed for wheezing 2)  Ativan 0.5 Mg Tabs (Lorazepam) .... Take 1 tablet by mouth twice a day 3)  Bayer Childrens Aspirin 81 Mg Chew (Aspirin) .... Take 1 tablet by mouth once a day 4)  Crestor 40 Mg Tabs (Rosuvastatin calcium) .... Take 1 tablet by mouth once a day for high cholesterol 5)  Depakote 250 Mg Tbec (Divalproex sodium) .... Take four times daily 6)  Hydrochlorothiazide 25 Mg Tabs (Hydrochlorothiazide) .... Take 1 tablet by mouth daily for blood pressure 7)  Metformin Hcl 500 Mg Tabs (Metformin hcl) .... Take 1 tablet by mouth once a day 8)  Nasacort Aq 55 Mcg/act Aers (Triamcinolone acetonide(nasal)) .... Spray 2 spray into both nostrils once a day 9)  Norvasc 5 Mg Tabs (Amlodipine besylate) .... Take 1 tablet by mouth once a day 10)  Omeprazole 40 Mg Cpdr (Omeprazole) .... One tablet by mouth two times a day as needed for reflux symptoms 11)  Risperdal 2 Mg Tabs (Risperidone) .... Take 1 tablet by mouth at bedtime 12)  Synthroid 75 Mcg Tabs (Levothyroxine sodium) .... Take 1 tablet by mouth once a day for hypothyroidism 13)  Prodigy Blood Glucose Test Strp (Glucose blood) .... Check blood sugar up to 3 times a day disp: 1 month supply 14)  Prednisone 20 Mg Tabs (Prednisone) .... Take 2 pills daily for 5 days then stop 15)  Doxycycline Hyclate 100 Mg Caps (Doxycycline hyclate) .... Take 1 tab by mouth two times a day x 7 days  Patient Instructions: 1)  I have sent in your medications to the  pharmacy, including refills. 2)  We will set up your abdominal ultrasound.  Come back to see your new doctor, Dr. Louanne Belton, and he will go over your results. 3)  It was good to meet you today, I hope you start feeling better.  Prescriptions: PROVENTIL HFA 108 (90 BASE) MCG/ACT AERS (ALBUTEROL SULFATE) 2 puffs q4h as needed for wheezing  #1 x 2   Entered and Authorized by:   Renold Don MD   Signed by:   Renold Don MD on 03/05/2010   Method used:   Electronically to        [facility] [address] [phone]* (retail)       [address]       [place], [place]  [address]       Ph: [phone]       Fax: [phone]   RxID:   [id number] DOXYCYCLINE HYCLATE 100 MG CAPS (DOXYCYCLINE HYCLATE) Take 1 tab by mouth two times a day x 7 days  #14 x 0   Entered and Authorized by:   Renold Don MD   Signed by:   Renold Don MD on 03/05/2010   Method used:   Electronically to        [facility] [address] [phone]* (retail)       [address]       [place], [place]  [address]       Ph: [phone]       Fax: [phone]   RxID:   [id number] PREDNISONE 20 MG TABS (PREDNISONE) Take 2 pills daily for 5 days then stop  #10 x 0   Entered and Authorized by:   Renold Don MD   Signed by:   Renold Don MD on 03/05/2010   Method used:   Electronically to        [facility] [address] [phone]* (retail)       [address]       [place], [place]  [address]       Ph: [phone]       Fax: [phone]   RxID:   [id number]

## 2010-08-05 NOTE — Assessment & Plan Note (Signed)
Summary: Rx clinic: heel calcaneal scan   Vital Signs:  Patient Profile:   60 Years Old Female Height:     62.5 inches Weight:      160.1 pounds Pulse rate:   94 / minute BP sitting:   144 / 84                    Prior Medication List:  ALBUTEROL 90 MCG/ACT AERS (ALBUTEROL) Inhale 2 puff using inhaler every four hours ATIVAN 0.5 MG TABS (LORAZEPAM) Take 1 tablet by mouth twice a day BAYER CHILDRENS ASPIRIN 81 MG CHEW (ASPIRIN) Take 1 tablet by mouth once a day CRESTOR 40 MG TABS (ROSUVASTATIN CALCIUM) Take 1 tablet by mouth once a day DEPAKOTE 250 MG TBEC (DIVALPROEX SODIUM) Take four times daily HYDROCHLOROTHIAZIDE 25 MG TABS (HYDROCHLOROTHIAZIDE) Take 1 tablet by mouth every morning METFORMIN HCL 500 MG TABS (METFORMIN HCL) Take 1 tablet by mouth once a day NASACORT AQ 55 MCG/ACT AERS (TRIAMCINOLONE ACETONIDE(NASAL)) Spray 2 spray into both nostrils once a day NORVASC 5 MG TABS (AMLODIPINE BESYLATE) Take 1 tablet by mouth once a day [facility] OMEPRAZOLE 20 MG TBEC (OMEPRAZOLE) 2 tablets by mouth daily RISPERDAL 2 MG TABS (RISPERIDONE) Take 1 tablet by mouth at bedtime SYNTHROID 75 MCG TABS (LEVOTHYROXINE SODIUM) Take 1 tablet by mouth once a day   Current Allergies: * CDN LISINOPRIL (LISINOPRIL)        Impression & Recommendations:  Problem # 1:  MENOPAUSAL SYNDROME (ICD-627.2) Assessment: Unchanged Patient heel calcaneal scan t-score was -0.36 which is normal.  She is at low risk of fracture.  Instructed patient to begin Caltrate with vitamin D 500 mg two times a day.  She will follow up in 2 to 3 years for another scan. Orders: Dexa scan (Dexa scan)   Problem # 2:  TOBACCO DEPENDENCE (ICD-305.1) Assessment: Unchanged Patient showed an interest in smoking cessation.  Patient will make an appointment to follow up in pharmacy clinic. Orders: Dexa scan (Dexa scan)   Complete Medication List: 1)  Albuterol 90 Mcg/act Aers (Albuterol) .... Inhale 2 puff using  inhaler every four hours 2)  Ativan 0.5 Mg Tabs (Lorazepam) .... Take 1 tablet by mouth twice a day 3)  Bayer Childrens Aspirin 81 Mg Chew (Aspirin) .... Take 1 tablet by mouth once a day 4)  Crestor 40 Mg Tabs (Rosuvastatin calcium) .... Take 1 tablet by mouth once a day 5)  Depakote 250 Mg Tbec (Divalproex sodium) .... Take four times daily 6)  Hydrochlorothiazide 25 Mg Tabs (Hydrochlorothiazide) .... Take 1 tablet by mouth every morning 7)  Metformin Hcl 500 Mg Tabs (Metformin hcl) .... Take 1 tablet by mouth once a day 8)  Nasacort Aq 55 Mcg/act Aers (Triamcinolone acetonide(nasal)) .... Spray 2 spray into both nostrils once a day 9)  Norvasc 5 Mg Tabs (Amlodipine besylate) .... Take 1 tablet by mouth once a day 10)  [facility] Omeprazole 20 Mg Tbec (Omeprazole) .... 2 tablets by mouth daily 11)  Risperdal 2 Mg Tabs (Risperidone) .... Take 1 tablet by mouth at bedtime 12)  Synthroid 75 Mcg Tabs (Levothyroxine sodium) .... Take 1 tablet by mouth once a day  PAP Screening:    Last PAP smear:  12/24/2007  Mammogram Screening:    Last Mammogram:  10/22/2007  Osteoporosis Risk Assessment:  Risk Factors for Fracture or Low Bone Density:   Race (White or Asian):     no   Hx of Fractures:  no   FH of Osteoporosis:     no   Hx of falls:       yes   Physically inactive:     no   Smoking status:       current   High alcohol use:     no   High caffeine use:     no   Low calcium/Vit. D intake:   no   Corticosteroid use:     yes   Thyroid replacement:     yes   Dilantin use:       no   Heparin use:       no   Thyroid disease:     yes   Rheumatoid Arthritis:     no   Parathyroid disease:     no  Bone Density (DEXA Scan) Results:    Date of Exam:  06/12/2008    Results:  Normal  Bone Density Comments:    t-score: -0.36  Next Bone Density Due:    06/12/2010  Immunization & Chemoprophylaxis:    Tetanus vaccine: Done.  (08/04/1998)    Influenza vaccine: Fluvax 3+  (07/11/2007)     Pneumovax: Pneumovax  (07/11/2007)   Patient Instructions: 1)  Peripheral bone scan is normal.   2)  Recommend starting Caltrate plus vit D 500 mg two times a day. 3)  Make an appointment with pharmacy clinic for smoking cessation. 4)  Follow up with provider as directed.   ]

## 2010-08-05 NOTE — Progress Notes (Signed)
Summary: status of rx  Phone Note Call from Patient Call back at Home Phone [phone]   Reason for Call: Talk to Nurse Summary of Call: pt is requesting to speak with RN re: medication that was suppose to be sent to the pharmacy, wants to be sure all her meds were sent to [facility] on [address] b/c she just switched pharmacies. Also she was needs an rx for the freestyle flash machine, the strips and lantus. Initial call taken by: ERIN LEVAN,  July 11, 2007 1:42 PM  Follow-up for Phone Call        Pt is checking status and is wanting them asap. Follow-up by: Haydee Salter,  July 11, 2007 4:21 PM  Additional Follow-up for Phone Call Additional follow up Details #1::        Pt is calling again to check status.  She is very anxious to pick them up. Additional Follow-up by: Haydee Salter,  July 11, 2007 4:33 PM    Additional Follow-up for Phone Call Additional follow up Details #2::    returned call. pt is not at home . advised pt answering phone to have pt call tomorrow. there is no record of pt requesting medications before today. need to know what meds she needs. do not see in med list that pt is on Lantus . will send message to Dr. Mannie Stabile. Follow-up by: [name],  July 11, 2007 5:09 PM  Additional Follow-up for Phone Call Additional follow up Details #3:: Details for Additional Follow-up Action Taken: Rx's sent following office visit to patient's pharmacy. Additional Follow-up by: Towana Badger MD,  July 11, 2007 5:43 PM       Appended Document: status of rx spoke with patient and she needs Rx called in to pharmacy for new glucose monitor, strips and lancets. Rx called to pharmacy for Freestyle glucose monitor, strips to check blood sugars twice daily and lancets.

## 2010-08-05 NOTE — Miscellaneous (Signed)
Summary: RX  Patient is requesting a rx for Metaformin.  She needs to make sure that medicaid will approve a new rx for it, because she misplaced the last one.  Her blood sugar is up.  She uses [facility] on [address]. Her phone number is [phone].  Bradly Bienenstock  June 09, 2009 3:50 PM  Refill request completed...1 month supply...no further refills until she has an office visit.......Marland KitchenMarisue Ivan, MD

## 2010-08-05 NOTE — Assessment & Plan Note (Signed)
Summary: SENSITIVITY CHARGE   

## 2010-08-05 NOTE — Progress Notes (Signed)
       Additional Follow-up for Phone Call Additional follow up Details #2::    got a call from her pharmacy. medicaid will only pay for the Prodigy meter, strips, lancets. told pharmacist to dispense them with as needed refills on strips & lancets. told him testing one daily. message to md to see if more testing is needed at this time Follow-up by: Golden Circle RN,  July 17, 2008 4:05 PM  Additional Follow-up for Phone Call Additional follow up Details #3:: Details for Additional Follow-up Action Taken: That is appropriate since she is not on any insulin and has low likelihood of becoming hypoglycemic Additional Follow-up by: Marisue Ivan  MD,  July 18, 2008 12:12 PM

## 2010-08-05 NOTE — Assessment & Plan Note (Signed)
Summary: concern regarding STDs   Vital Signs:  Patient profile:   60 year old female Height:      62.5 inches Weight:      106.7 pounds BMI:     19.27 Temp:     98.1 degrees F oral Pulse rate:   86 / minute BP sitting:   116 / 89  (right arm) Cuff size:   regular  Vitals Entered By: Gladstone Pih (June 15, 2009 10:10 AM) CC: concern regarding STDs Is Patient Diabetic? Yes Did you bring your meter with you today? No Pain Assessment Patient in pain? no        Primary Care Provider:  Marisue Ivan  MD  CC:  concern regarding STDs.  History of Present Illness: 60yo F here with concerns regarding STDs  STD risk: Reports being sexually active without the use of condoms.  Recently found out that her partner has not been faithful and she would like to be tested for STDs.  Denies any abd pain, vaginal discharge, or bleeding.  Post-menopausal.  No fevers or chills.  No lesions or rashes of the genital area.  Habits & Providers  Alcohol-Tobacco-Diet     Tobacco Status: current     Tobacco Counseling: to quit use of tobacco products     Cigarette Packs/Day: 0.5  Current Medications (verified): 1)  Albuterol 90 Mcg/act Aers (Albuterol) .... Inhale 2 Puff Using Inhaler Every Four Hours 2)  Ativan 0.5 Mg Tabs (Lorazepam) .... Take 1 Tablet By Mouth Twice A Day 3)  Bayer Childrens Aspirin 81 Mg Chew (Aspirin) .... Take 1 Tablet By Mouth Once A Day 4)  Crestor 40 Mg Tabs (Rosuvastatin Calcium) .... Take 1 Tablet By Mouth Once A Day For High Cholesterol 5)  Depakote 250 Mg Tbec (Divalproex Sodium) .... Take Four Times Daily 6)  Hydrochlorothiazide 25 Mg Tabs (Hydrochlorothiazide) .... Take 1 Tablet By Mouth Daily For Blood Pressure 7)  Metformin Hcl 500 Mg Tabs (Metformin Hcl) .... Take 1 Tablet By Mouth Once A Day 8)  Nasacort Aq 55 Mcg/act Aers (Triamcinolone Acetonide(Nasal)) .... Spray 2 Spray Into Both Nostrils Once A Day 9)  Norvasc 5 Mg Tabs (Amlodipine Besylate) ....  Take 1 Tablet By Mouth Once A Day 10)  Omeprazole 40 Mg Cpdr (Omeprazole) .... One Tablet By Mouth Two Times A Day 11)  Risperdal 2 Mg Tabs (Risperidone) .... Take 1 Tablet By Mouth At Bedtime 12)  Synthroid 75 Mcg Tabs (Levothyroxine Sodium) .... Take 1 Tablet By Mouth Once A Day For Hypothyroidism 13)  Tylenol Arthritis Pain 650 Mg Cr-Tabs (Acetaminophen) .... One Tablet By Mouth Three Times A Day For Arthritis (Medically Necessary)  Allergies (verified): 1)  * Cdn 2)  Lisinopril (Lisinopril)  Social History: smokes 5cigarettes per day.  Mother died October 07, 2001.  Lives alone and has one sexual partner periodically. Best Contact # [phone] (home)Packs/Day:  0.5  Review of Systems       Denies any abd pain, vaginal discharge, or bleeding.  Post-menopausal.  No fevers or chills.  No lesions or rashes of the genital area.  Physical Exam  General:  VS reviewed.  Non ill appearing, NAD. Abdomen:  soft, NT, ND, no palpable mass Genitalia:  External genitalia: no lesions or rashes Speculum exam: nl pale colored mucosa, minimal discharge, no bleeding, cervix- stenotic os, no lesions or erythema Bimanual: no cervical motion tenderness, no adnexal mass   Impression & Recommendations:  Problem # 1:  SEXUAL ACTIVITY, HIGH RISK (ICD-V69.2) Assessment Deteriorated  Upreg neg.  Tested for GC/Chl, HIV, RPR, and wet prep obtained.  Counseled on safe sex and use of condoms.  Will call her with lab results.     Orders: GC/Chlamydia-[facility] ([id number]) HIV-[facility] ([id number]) RPR-[facility] [id number]) Wet Prep- [facility] ([id number]) U Preg-[facility] ([id number]) [facility]- Est  Level 4 (08657)  Problem # 2:  Preventive Health Care (ICD-V70.0) Assessment: Comment Only Previous pap smears have all been nl.  Last one was 2009.  Next one due 2012  Complete Medication List: 1)  Albuterol 90 Mcg/act Aers (Albuterol) .... Inhale 2 puff using inhaler every four hours 2)  Ativan 0.5 Mg Tabs (Lorazepam) .... Take 1 tablet by mouth twice  a day 3)  Bayer Childrens Aspirin 81 Mg Chew (Aspirin) .... Take 1 tablet by mouth once a day 4)  Crestor 40 Mg Tabs (Rosuvastatin calcium) .... Take 1 tablet by mouth once a day for high cholesterol 5)  Depakote 250 Mg Tbec (Divalproex sodium) .... Take four times daily 6)  Hydrochlorothiazide 25 Mg Tabs (Hydrochlorothiazide) .... Take 1 tablet by mouth daily for blood pressure 7)  Metformin Hcl 500 Mg Tabs (Metformin hcl) .... Take 1 tablet by mouth once a day 8)  Nasacort Aq 55 Mcg/act Aers (Triamcinolone acetonide(nasal)) .... Spray 2 spray into both nostrils once a day 9)  Norvasc 5 Mg Tabs (Amlodipine besylate) .... Take 1 tablet by mouth once a day 10)  Omeprazole 40 Mg Cpdr (Omeprazole) .... One tablet by mouth two times a day 11)  Risperdal 2 Mg Tabs (Risperidone) .... Take 1 tablet by mouth at bedtime 12)  Synthroid 75 Mcg Tabs (Levothyroxine sodium) .... Take 1 tablet by mouth once a day for hypothyroidism 13)  Tylenol Arthritis Pain 650 Mg Cr-tabs (Acetaminophen) .... One tablet by mouth three times a day for arthritis (medically necessary)  Other Orders: A1C-[facility] ([id number])  Patient Instructions: 1)  Schedule an appt with me to follow up on your other issues at your convenience. 2)  I'll call you in the next 1-2 days with the lab results. Prescriptions: OMEPRAZOLE 40 MG CPDR (OMEPRAZOLE) one tablet by mouth two times a day  #60 x 2   Entered and Authorized by:   Marisue Ivan  MD   Signed by:   Marisue Ivan  MD on 06/15/2009   Method used:   Electronically to        [facility] [phone]* (retail)       [address]       [place], [place]  [address]       Ph: [phone]       Fax: [phone]   RxID:   [id number]   Laboratory Results   Urine Tests  Date/Time Received: June 15, 2009 11:05 AM  Date/Time Reported: June 15, 2009 11:09 AM     Urine HCG: negative Comments: ...........test performed by...........Marland KitchenTerese Door,  CMA      Appended Document: Wet prep/A1c results  Called patient and discussed lab results.  Flagyl 500mg  two times a day x 7 days for tx of BV........Marland KitchenMarisue Ivan, MD  Laboratory Results   Blood Tests   Date/Time Received: June 15, 2009 11:05 AM  Date/Time Reported: June 15, 2009 12:00 PM   HGBA1C: 6.0%   (Normal Range: Non-Diabetic - 3-6%   Control Diabetic - 6-8%)  Comments: ...........test performed by...........Marland KitchenTerese Door, CMA  Date/Time Received: June 15, 2009 1105 AM  Date/Time Reported: June 15, 2009 11:59 AM   [place] Source: vaginal WBC/hpf: rare Bacteria/hpf:  3+  Cocci Clue cells/hpf: moderate  Positive whiff Yeast/hpf: none Trichomonas/hpf: none Comments: ...........test performed by...........Marland KitchenTerese Door, CMA

## 2010-08-05 NOTE — Progress Notes (Signed)
Summary: Authorization to continue care  Phone Note Other Incoming Call back at [phone]   Call placed by: Debbie @ [facility] Summary of Call: Needing authorization to continue care. Initial call taken by: Haydee Salter,  April 26, 2007 11:38 AM  Follow-up for Phone Call        spoke with Eunice Blase  and she states she will be sending paperwork for MD to fill out. Follow-up by: [name],  April 26, 2007 11:57 AM

## 2010-08-05 NOTE — Progress Notes (Signed)
Summary: [place] request  Phone Note Call from Patient Call back at Home Phone [phone]   Reason for Call: Talk to Nurse Summary of Call: pt states Dr Irving Burton told her to come this week for a pap since she has been itching a lot Initial call taken by: Haydee Salter,  October 02, 2006 9:12 AM  Follow-up for Phone Call        REPORTS VAGINAL ITCHING AND ODOR. WAS IN TO SEE DR. Irving Burton LAST WEEK AND TOLD TO RESCHEDULE FOR PELVIS EXAM REGARDING THESE ISSUES. OFFERED APPOINTMENT TOMORROW OR  WED BUT SHE HAS OTHER APPONTMENTS AND CANNOT COME. OFFERED APPOINTMENT THUR. BUT SHE ONLY WANTS TO SEE DR. Irving Burton. STATES SHE WILL CALL BACK LATER . RECOMMENDED MAYBE TO TRY OTC MED BUT SHE STATES SHE IS ALLERGIC TO LOTS OF MEDS AND DOES NOT TAKE OTC PRODUCTS. SHE WILL CALL BACK LATER FOR APPOINTMENT. Follow-up by: [name],  October 02, 2006 9:54 AM

## 2010-08-05 NOTE — Progress Notes (Signed)
Summary: ENT Referral  Phone Note Call from Patient Call back at Home Phone [phone]   Summary of Call: Pt is requesting referral to ENT dr ASAP.  She state she called awhile ago for this, but there is no documentation of that.   Initial call taken by: Haydee Salter,  March 30, 2007 8:53 AM  Follow-up for Phone Call        Have her make an appt. Follow-up by: Towana Badger MD,  March 30, 2007 10:18 AM  Additional Follow-up for Phone Call Additional follow up Details #1::        pt is calling back, informed of above and pt sts her ears are hurting really bad and she will be calling back monday morning to request wi appt. Dr. Mannie Stabile doesn't have any appts until 10/29, pt can't wait that long. She sts if there are any problems to have an rn to call her back. Pt would also like rn to call her back to see what she can do to ease the pain? Additional Follow-up by: ERIN LEVAN,  March 30, 2007 1:56 PM    Additional Follow-up for Phone Call Additional follow up Details #2::    pt reports ear pain for 4 months. Dr. Mannie Stabile states pt needs to scheduled appointment to be seen here before referral. offered appointment today but has no transportation. appointment scheduled 04/02/07. suggested she go to urgent care over the weekend if needed and she has transportation. advised ibuprofen for  discomfort. Follow-up by: [name],  March 30, 2007 2:13 PM

## 2010-08-05 NOTE — Progress Notes (Signed)
Summary: Rx  Phone Note Refill Request Call back at Home Phone [phone] Message from:  Patient  needs refill on albuterol 2.5 for nebulizer machine - was seen last week to get refills on medications.  states she is wheezing and needs this [facility]ill [facility].  Initial call taken by: Haydee Salter,  August 20, 2008 10:30 AM  Follow-up for Phone Call        explained to patient   that albuterol for nebulizer has not been prescribed here in past 2 years and if she is having wheezing will need to schedule appointment for work in appointment to be examined. states she  has appointment with pcp next tuesday on 08/26/2008. patient thinks she has gotten albuterol for nebulizer from Dr. Sherene Sires in the past. suggested she call him to ask if this can be refilled. offered appointment again for work in but patient declined. Follow-up by: [name],  August 20, 2008 12:12 PM
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
## 2010-08-05 NOTE — Assessment & Plan Note (Signed)
Summary: cough and ear pain   Vital Signs:  Patient Profile:   60 Years Old Female Height:     62.5 inches Weight:      157.31 pounds BMI:     28.42 O2 Sat:      93 % Temp:     98.2 degrees F Pulse rate:   83 / minute BP sitting:   101 / 67  (right arm)  Pt. in pain?   yes    Location:   head & face    Intensity:   7  Vitals Entered By: Dennison Nancy RN (January 25, 2008 2:56 PM)                  PCP:  Marisue Ivan  MD  Chief Complaint:  cough and ear pain.  History of Present Illness: 60yo AAF with multiple chronic medical problems here b/c of cough and chronic ear pain.  Cough: 1 week duration. Productive cough with yellow sputum that is lessening.  Associate congestion and rhinorheea.  Grandchildren had similar sx 1 week prior.  No blood.  H/o COPD and tobacco use, smokes 1/2- 2ppd.  Uses albuterol regularly and has not been on Spiriva.  No O2 at home.  No documented fever.  No CP and intermittent wheezing with lying down but not actively wheezing.  No reflux noted.  Ear Pain: Has a long standing h/o ear pain.  Reports that it has been worse the past 8 months, L>R.  She was seen in clinic 10wks prior at thought to have fungal otitis and treated with Cortomycin without improvement.  She reports tinnitus, vertigo, and decrease hearing.  Denies any drainage.  Reports pain with palpation.  No fevers, no syncopal episodes, and no falls.    Current Allergies: * CDN LISINOPRIL (LISINOPRIL)    Risk Factors:     Counseled to quit/cut down tobacco use:  yes   Review of Systems      See HPI   Physical Exam  General:     Well appearing AAF, that appears older than actual age, NAD Ears:     Tenderness to left pinna and tragus; no mastoid tenderness; no active drainage or bleeding; moderately opaque ear drum with moderate erythema along canal; difficult to assess fluid behind TMs Lungs:     Normal respiratory effort, chest expands symmetrically. Lungs are clear to  auscultation, no crackles or wheezes. Heart:     Normal rate and regular rhythm. S1 and S2 normal without gallop, murmur, click, rub or other extra sounds.    Impression & Recommendations:  Problem # 1:  UPPER RESPIRATORY INFECTION, VIRAL (ICD-465.9) Assessment: New More likely an acute viral resp infxn given hx and exam; not likely a COPD exacerbation; will treat with supportive care along with new med of Advair.  Educated about albuterol as rescue med not a maintenance med; also provided smoking cessation counseling; no hx of PFTs; will set up with Dr. Michaelyn Barter for PFTs   Her updated medication list for this problem includes:    Bayer Childrens Aspirin 81 Mg Chew (Aspirin) .Marland Kitchen... Take 1 tablet by mouth once a day  Orders: [facility]- Est  Level 4 ([address])   Problem # 2:  EAR PAIN (ICD-388.70) Assessment: Unchanged Chronic issue; Ambigious sx of Menires with abnl ear exam that has failed outpt therapy; plan to refer to ENT; pt examined and d/w Dr. Jennette Kettle   The following medications were removed from the medication list:    Cortomycin 3.5-10000-1  Susp (Neomycin-polymyxin-hc) .Marland KitchenMarland KitchenMarland KitchenMarland Kitchen 4 drops in both ears four times a day for 10 days    A/b Otic 1.4-5.4 % Soln (Benzocaine-antipyrine) .Marland KitchenMarland KitchenMarland KitchenMarland Kitchen 4 drops in each ear up to four times a day for pain relief.  Orders: ENT Referral (ENT) [facility]- Est  Level 4 ([address])   Problem # 3:  Preventive Health Care (ICD-V70.0) Assessment: Comment Only Given the complexity and multitude of medical problems, we have limited the amount of complaints and issues to the time allowed and will schedule more frequent visits to cover her other issues.    Complete Medication List: 1)  Albuterol 90 Mcg/act Aers (Albuterol) .... Inhale 2 puff using inhaler every four hours 2)  Ativan 0.5 Mg Tabs (Lorazepam) .... Take 1 tablet by mouth twice a day 3)  Bayer Childrens Aspirin 81 Mg Chew (Aspirin) .... Take 1 tablet by mouth once a day 4)  Crestor 40 Mg Tabs (Rosuvastatin calcium)  .... Take 1 tablet by mouth once a day 5)  Depakote 250 Mg Tbec (Divalproex sodium) .... Take 6)  Hydrochlorothiazide 25 Mg Tabs (Hydrochlorothiazide) .... Take 1 tablet by mouth every morning 7)  Metformin Hcl 500 Mg Tabs (Metformin hcl) .... Take 1 tablet by mouth once a day 8)  Nasacort Aq 55 Mcg/act Aers (Triamcinolone acetonide(nasal)) .... Spray 2 spray into both nostrils once a day 9)  Norvasc 5 Mg Tabs (Amlodipine besylate) .... Take 1 tablet by mouth once a day 10)  Optichamber Advantage Misc (Spacer/aero-holding chambers) 11)  Protonix 40 Mg Tbec (Pantoprazole sodium) .Marland Kitchen.. 1 tablet by mouth once a day 12)  Risperdal 2 Mg Tabs (Risperidone) .... Take 1 tablet by mouth at bedtime 13)  Synthroid 75 Mcg Tabs (Levothyroxine sodium) .... Take 1 tablet by mouth once a day 14)  Zetia 10 Mg Tabs (Ezetimibe) .Marland Kitchen.. 1 tablet by mouth once a day 15)  Spiriva Handihaler 18 Mcg Caps (Tiotropium bromide monohydrate) .... Q.s. 30 days - one capsule once a day 16)  Advair Diskus 250-50 Mcg/dose Misc (Fluticasone-salmeterol) .Marland Kitchen.. 1 puff twice a day  Other Orders: Future Orders: PFT Baseline-Pre/Post Bronchodiolator (PFT Baseline-Pre/Pos) ... 01/08/2009   Patient Instructions: 1)  Scedule an office visit on the same day that pulmonary function tests are scheduled. 2)  Start advair today as prescribed. 3)  Stop smoking. 4)  Will set up with ENT referral. 5)  Will discuss other chronic issues at next visit.   Prescriptions: ADVAIR DISKUS 250-50 MCG/DOSE  MISC (FLUTICASONE-SALMETEROL) 1 puff twice a day  #1 x 1   Entered and Authorized by:   Marisue Ivan  MD   Signed by:   Marisue Ivan  MD on 01/25/2008   Method used:   Electronically sent to ...       [facility]  [address] [phone]*       [address]       [place], [place]  [address]       Ph: [phone]       Fax: [phone]   RxID:   [id number] ALBUTEROL 90 MCG/ACT AERS (ALBUTEROL) Inhale 2 puff using inhaler every four  hours  #1 x 1   Entered and Authorized by:   Marisue Ivan  MD   Signed by:   Marisue Ivan  MD on 01/25/2008   Method used:   Electronically sent to ...       [facility]  [address] [phone]*       [address]       [place], [place]  [address]       Ph: [phone]       Fax: [phone]   RxID:   [id number]  ]

## 2010-08-05 NOTE — Progress Notes (Signed)
Summary: Test Res  Phone Note Call from Patient Call back at Home Phone [phone]   Caller: Patient Summary of Call: Pt checking on ultrasound results. Initial call taken by: Clydell Hakim,  March 10, 2010 12:17 PM  Follow-up for Phone Call        will forward message to MD. Follow-up by: [name],  March 10, 2010 12:29 PM  Additional Follow-up for Phone Call Additional follow up Details #1::        talked to pt old her that it was negative told her the pain on left side could be something else such as constipation or a possible ulcer due to association with food.  Pt states having black stool but been drinking pepto religously.  Told her she should be seen again if having pain. Pt agreed.  No pain now.  Additional Follow-up by: Antoine Primas DO,  March 10, 2010 6:00 PM

## 2010-08-05 NOTE — Progress Notes (Signed)
Summary: WI request  Phone Note Call from Patient Call back at Home Phone [phone]   Summary of Call: Pt is requesting to speak with an rn about getting over an ear infection, states she is still feeling bad. Initial call taken by: Haydee Salter,  April 18, 2007 9:05 AM  Follow-up for Phone Call        Phone call to pt- no answer, unable to leave voicemail. Follow-up by: AMY MARTIN RN,  April 18, 2007 10:53 AM  Additional Follow-up for Phone Call Additional follow up Details #1::        spoke with pt and she continues to have ear discomfort and has drainage down back of throat and expectorating yellow  sputum. ear has a numb sensation. offered work in appointment tomorrow, but she cannot call transportation for next day after 12:00. she request apppointment for 04/26/07 and appointment is scheduled. Additional Follow-up by: [name],  April 19, 2007 3:05 PM

## 2010-08-05 NOTE — Miscellaneous (Signed)
Summary: dnka/ts  Clinical Lists Changes 

## 2010-08-05 NOTE — Miscellaneous (Signed)
Summary: prob ltr  Clinical Lists Changes she is here with one of her grandchildren. wanted a refill & to be seen for a cold. declined to fill med. no appt left for today. can come tomorrow. states she has an appt here next week. told her she can use UC if she feels she needs to be seen now. read her the letter she rec'd as she was very upset & wanted to talk about it. states" no one ever asked me what my problems are" states she is raising 5 grandchildren & has no phone. I explained that every day we have 20 people or more not keep their appts or call to cancel them. This prevents [place] from seeing so many more who can come in for their problems. told her we are sending the letter to everyone who has had this problem keeping appts. she said she was going to find another md office.Marland Kitchen Marland KitchenGolden Circle RN  June 09, 2009 3:42 PM

## 2010-08-05 NOTE — Progress Notes (Signed)
Summary: Reschedule appt  Phone Note Call from Patient Call back at Home Phone [phone]   Reason for Call: Talk to Nurse Summary of Call: pt is requesting to change her colpo appt that was scheduled tomorrow. Initial call taken by: ERIN LEVAN,  Nov 20, 2006 1:51 PM   APPT RESCHEDULED FOR JUNE 17,2008 AT 10:45AM PT AGREEABLE.

## 2010-08-05 NOTE — Letter (Signed)
Summary: Generic Letter  [facility]  [address]   [place], [place] [address]   Phone: [phone]  Fax: [phone]    09/04/2007  [name] Long [address] [place], [place]  [address]  Dear Ms. [name],  Dr. Mannie Stabile asked me to notify you that it is time for you to have a mammogram.  I have included a sheet with the numbers of 2 places you can call to set up an appointment for your mammogram.  Please call [phone] if you have any questions.           Sincerely,   AMY MARTIN RN [facility]

## 2010-08-05 NOTE — Progress Notes (Signed)
  Phone Note Outgoing Call   Call placed by: Jimmy Footman, CMA,  March 05, 2010 2:15 PM Call placed to: Patient Summary of Call: attempted to call patient to inform of the ultrasound appt date.  9/6 @ 10:30 am. at 301 location. Information under referral. No answering machine  Follow-up for Phone Call        pt informed and instructed and understood and agreed Follow-up by: Loralee Pacas CMA,  March 05, 2010 3:22 PM

## 2010-08-05 NOTE — Miscellaneous (Signed)
   Clinical Lists Changes  Observations: Added new observation of CHD 68YR RSK: 13 % (05/17/2007 15:58) Added new observation of LIPDGOALSMET: Yes (05/17/2007 15:58) Added new observation of CHIEF CMPLNT: Lipid Management (05/17/2007 15:58) Added new observation of LLIMPORTALLS: completed (05/17/2007 15:58) Added new observation of LLIMPORTMEDS: completed (05/17/2007 15:58) Added new observation of LDL: 80 mg/dL (04/54/0981 19:14)       Lipid Management History:      Positive NCEP/ATP III risk factors include female age 59 years old or older, diabetes, family history for ischemic heart disease (females less than 13 years old), current tobacco user, and hypertension.  Negative NCEP/ATP III risk factors include no history of early menopause without estrogen hormone replacement, no ASHD (atherosclerotic heart disease), no prior stroke/TIA, no peripheral vascular disease, and no history of aortic aneurysm.        The patient states that she knows about the "Therapeutic Lifestyle Change" diet.  Her compliance with the TLC diet is poor.    Lipid Assessment/Plan:      Based on NCEP/ATP III, the patient's risk factor category is "history of diabetes".  From this information, the patient's calculated lipid goals are as follows: Total cholesterol goal is 200; LDL cholesterol goal is 100; HDL cholesterol goal is 40; Triglyceride goal is 150.  Her LDL cholesterol goal has been met.

## 2010-08-05 NOTE — Progress Notes (Signed)
Summary: Rx Req  Phone Note Call from Patient Call back at Home Phone [phone]   Caller: Patient Summary of Call: needs refill on pantoprazole called into [facility] [address]. Initial call taken by: Clydell Hakim,  Nov 11, 2008 9:14 AM  Follow-up for Phone Call        completed Follow-up by: Marisue Ivan  MD,  Nov 12, 2008 11:19 AM

## 2010-08-05 NOTE — Assessment & Plan Note (Signed)
Summary: fu wp   Vital Signs:  Patient Profile:   60 Years Old Female Height:     62.5 inches Weight:      159 pounds BMI:     28.72 Temp:     98.3 degrees F Pulse rate:   98 / minute BP sitting:   109 / 74  (left arm)  Pt. in pain?   yes    Location:   left knee    Intensity:   7  Vitals Entered By: Dennison Nancy RN (February 29, 2008 3:29 PM)              Is Patient Diabetic? Yes      PCP:  Marisue Ivan  MD  Chief Complaint:  Discuss 2 chronic issues and talk about knee surgery.  History of Present Illness: 60yo F w/ multiple chronic medical issues here to discuss at least 2 of those and to discuss knee surgery.  Knee surgery- Originally had planned on having a L knee replacement but has decided to wait since she has a new grandchild.  Tobacco use- Smiles menthol 100s.  1/2 ppd.  Does not desire to quit today.  Chronic Bronchitis-  She had recent PFTs performed per Dr. Raymondo Band on 02/11/08.  Pre-spirometry FVC 68% pred, FEV1- 65% pred, FEV1/FVC 93% pred.  Post-spirometry FVC 69% pred, FEV1- 66% pred, FEV1/FVC 93% pred.  Her lung age was 45yrs.  See specific document for complete results.  She is currently not taking her spiriva and is using the advair and albuterol inhaler regularly.    DM Ii- Currently taking Metformin 500mg  daily without any adverse side effects.  Does not check her BG regularly.        Current Allergies: * CDN LISINOPRIL (LISINOPRIL)     Review of Systems  The patient denies fever, weight loss, weight gain, chest pain, dyspnea on exertion, and peripheral edema.     Physical Exam  General:     Overweight, AAF, with cigarette odor, NAD Lungs:     Normal respiratory effort, chest expands symmetrically. No crackles or wheezes. Heart:     Normal rate and regular rhythm. S1 and S2 normal without gallop, murmur, click, rub or other extra sounds. Abdomen:     soft, nontender, nondistended, no HSM Extremities:     no edema  Diabetes  Management Exam:    Foot Exam (with socks and/or shoes not present):       Sensory-Pinprick/Light touch:          Left medial foot (L-4): normal          Left dorsal foot (L-5): normal          Left lateral foot (S-1): normal          Right medial foot (L-4): normal          Right dorsal foot (L-5): normal          Right lateral foot (S-1): normal       Sensory-Monofilament:          Left foot: normal          Right foot: normal       Inspection:          Left foot: normal          Right foot: normal       Nails:          Left foot: normal          Right foot: normal  Impression & Recommendations:  Problem # 1:  TOBACCO DEPENDENCE (ICD-305.1) Assessment: Unchanged Offered smoking cessation.  Pt not desiring to quit.   Orders: [facility]- Est  Level 4 ([address])   Problem # 2:  BRONCHITIS, CHRONIC (ICD-491.9) Assessment: Unchanged PFTs c/w restrictive lung disease rather than obstructive reversible lung disease.  Will obtain CXR to r/o possible etiologies to account for restriction (e.g. sarcoid).  Advised to quit smoking.  Will stop the spiriva and advair for now.     Orders: CXR- 2view (CXR) [facility]- Est  Level 4 ([address])   Problem # 3:  DIABETES MELLITUS II, UNCOMPLICATED (ICD-250.00) Assessment: Unchanged Tolerating the metformin.  Will check A1c today.  Unable to place on ACE-I 2/2 allergies.     Her updated medication list for this problem includes:    Bayer Childrens Aspirin 81 Mg Chew (Aspirin) .Marland Kitchen... Take 1 tablet by mouth once a day    Metformin Hcl 500 Mg Tabs (Metformin hcl) .Marland Kitchen... Take 1 tablet by mouth once a day  Orders: A1C-[facility] ([address]) [facility]- Est  Level 4 ([address])   Problem # 4:  DEGENERATIVE JOINT DISEASE, KNEE (ICD-715.96) Assessment: Unchanged Pt decided to postpone left knee replacement.   Her updated medication list for this problem includes:    Bayer Childrens Aspirin 81 Mg Chew (Aspirin) .Marland Kitchen... Take 1 tablet by mouth once a day   Complete Medication  List: 1)  Albuterol 90 Mcg/act Aers (Albuterol) .... Inhale 2 puff using inhaler every four hours 2)  Ativan 0.5 Mg Tabs (Lorazepam) .... Take 1 tablet by mouth twice a day 3)  Bayer Childrens Aspirin 81 Mg Chew (Aspirin) .... Take 1 tablet by mouth once a day 4)  Crestor 40 Mg Tabs (Rosuvastatin calcium) .... Take 1 tablet by mouth once a day 5)  Depakote 250 Mg Tbec (Divalproex sodium) .... Take four times daily 6)  Hydrochlorothiazide 25 Mg Tabs (Hydrochlorothiazide) .... Take 1 tablet by mouth every morning 7)  Metformin Hcl 500 Mg Tabs (Metformin hcl) .... Take 1 tablet by mouth once a day 8)  Nasacort Aq 55 Mcg/act Aers (Triamcinolone acetonide(nasal)) .... Spray 2 spray into both nostrils once a day 9)  Norvasc 5 Mg Tabs (Amlodipine besylate) .... Take 1 tablet by mouth once a day 10)  [facility]vs Omeprazole 20 Mg Tbec (Omeprazole) .... 2 tablets by mouth daily 11)  Risperdal 2 Mg Tabs (Risperidone) .... Take 1 tablet by mouth at bedtime 12)  Synthroid 75 Mcg Tabs (Levothyroxine sodium) .... Take 1 tablet by mouth once a day  Other Orders: Dexa scan (Dexa scan)   Patient Instructions: 1)  Please schedule a follow-up appointment in 6 months. 2)  We will obtain a chest x-ray today.  I will call you with the results.  I want you to STOP SMOKING!!! 3)  We stopped the advair and spiriva today. 4)  I will schedule you for a dexa scan to test for osteoporosis.   Prescriptions: CRESTOR 40 MG TABS (ROSUVASTATIN CALCIUM) Take 1 tablet by mouth once a day  #34 x 5   Entered and Authorized by:   Marisue Ivan  MD   Signed by:   Marisue Ivan  MD on 02/29/2008   Method used:   Electronically to        [facility] [address] [address] [phone]* (retail)       [address]       [place], [place]  [address]       Ph: [phone]  Fax: [phone]   RxID:   Len.[email or web address]  ] Laboratory Results   Blood Tests   Date/Time Received: February 29, 2008 3:18  PM  Date/Time Reported: February 29, 2008 3:30 PM   HGBA1C: 6.2%   (Normal Range: Non-Diabetic - 3-6%   Control Diabetic - 6-8%)  Comments: ...............test performed by......Marland KitchenBonnie A. [place], [place] (ASCP)

## 2010-08-05 NOTE — Consult Note (Signed)
Summary: [facility] - plan for colonoscopy  [facility]   Imported By: De Nurse 07/19/2010 14:11:31  _____________________________________________________________________  External Attachment:    Type:   Image     Comment:   External Document

## 2010-08-05 NOTE — Progress Notes (Signed)
Summary: Cancelled appt  Phone Note Call from Patient   Summary of Call: Pt just called to cancel her appt for this morning due to her child not having transportation to school.  Advised pt I would send a message to MD and rn advising them of this.  Pt r/s'ed to Monday 12/28. Initial call taken by: Haydee Salter,  June 23, 2008 9:08 AM  Follow-up for Phone Call        message received.  will plan to see her on 12/28 Follow-up by: Marisue Ivan  MD,  June 23, 2008 9:28 AM

## 2010-08-05 NOTE — Miscellaneous (Signed)
Summary: Orders Update  Clinical Lists Changes  Problems: Added new problem of ENCOUNTER FOR LONG-TERM USE OF OTHER MEDICATIONS (ICD-V58.69) Orders: Added new Test order of B12-[facility] [phone]) - Signed Added new Test order of CBC-[facility] (56213) - Signed  Ok per Dr. Louanne Belton

## 2010-08-05 NOTE — Progress Notes (Signed)
Summary: Triage  Phone Note Call from Patient Call back at Home Phone [phone]   Reason for Call: Talk to Nurse Summary of Call: Pt wants to speak with a nurse about her ears bothering her.  She is scheduled to see Dr. Mannie Stabile on 6/8 @ 1:30pm. Initial call taken by: Haydee Salter,  December 03, 2007 10:40 AM  Follow-up for Phone Call        they are not as bad as before. does not want to see anyone but Dr.Pye. wanted to know if she can use up the rest of the ear drops she had from last week. told her to do that and urged her to call for appt if worse. Follow-up by: Golden Circle RN,  December 03, 2007 10:42 AM

## 2010-08-05 NOTE — Assessment & Plan Note (Signed)
Summary: ear pain/wp   Vital Signs:  Patient Profile:   60 Years Old Female Height:     62.5 inches Weight:      159.8 pounds Temp:     98.5 degrees F Pulse rate:   97 / minute BP sitting:   105 / 74  (left arm)  Pt. in pain?   no  Vitals Entered By: Alphia Kava (Nov 09, 2007 2:16 PM)              Is Patient Diabetic? No     Chief Complaint:  bil ear pain.  History of Present Illness: HPI/ROS Follow-up problem #1:  Ear pain - She was seen for a similar complaint last year. HPI/ROS Follow-up problem #2:  Diabetes Mellitus - She was last seen for this 4 months ago.  Patient is here for an acute ear problem.  The plan today is to discuss adherence to follow-up.  HPI/ROS Tobacco Use:  Per USPSTF recommendations, tobacco use is assessed on every visit, regardless of chief complaint.  See Risk Factors and discussion in CPOE where applicable.   Diabetes Management History:      The patient is a 60 years old female who comes in for evaluation of DM Type 2.  She is not checking home blood sugars.  She says that she is not exercising regularly.       Current Allergies: * CDN LISINOPRIL (LISINOPRIL)    Risk Factors:     Counseled to quit/cut down tobacco use:  yes    Physical Exam  Awake, alert, no distress.  Pt is responsive and intelligible.  No icterus, jaundice or JVD.  Heart sounds are regular and without murmur, thrill or PMI displacement.  Normal work of breathing with lungs clear to auscultation and percussion.  Abdomen is soft, nontender and nondistended.  Normal bowel sounds.  Extremities are warm and well perfused.  Bilateral EAC white, filamentous adhesions to canal.  TM's clear.  Peripheral EAC erythema.  No periauricular TTP.    Impression & Recommendations:  Problem # 1:  EAR PAIN, BILATERAL (ICD-388.70) Assessment: Deteriorated Patient has fungal otitis this time.  Her updated medication list for this problem includes:    Cortomycin 3.5-10000-1  Susp (Neomycin-polymyxin-hc) .Marland KitchenMarland KitchenMarland KitchenMarland Kitchen 4 drops in both ears four times a day for 10 days    A/b Otic 1.4-5.4 % Soln (Benzocaine-antipyrine) .Marland KitchenMarland KitchenMarland KitchenMarland Kitchen 4 drops in each ear up to four times a day for pain relief.   Problem # 2:  TOBACCO DEPENDENCE (ICD-305.1) Advised cessation.  Problem # 3:  Preventive Health Care (ICD-V70.0)  Complete Medication List: 1)  Albuterol 90 Mcg/act Aers (Albuterol) .... Inhale 2 puff using inhaler every four hours 2)  Ativan 0.5 Mg Tabs (Lorazepam) .... Take 1 tablet by mouth twice a day 3)  Bayer Childrens Aspirin 81 Mg Chew (Aspirin) .... Take 1 tablet by mouth once a day 4)  Crestor 40 Mg Tabs (Rosuvastatin calcium) .... Take 1 tablet by mouth once a day 5)  Depakote 250 Mg Tbec (Divalproex sodium) .... Take 6)  Hydrochlorothiazide 25 Mg Tabs (Hydrochlorothiazide) .... Take 1 tablet by mouth every morning 7)  Metformin Hcl 500 Mg Tabs (Metformin hcl) .... Take 1 tablet by mouth once a day 8)  Nasacort Aq 55 Mcg/act Aers (Triamcinolone acetonide(nasal)) .... Spray 2 spray into both nostrils once a day 9)  Norvasc 5 Mg Tabs (Amlodipine besylate) .... Take 1 tablet by mouth once a day 10)  Optichamber Advantage Misc (Spacer/aero-holding chambers) 11)  Protonix 40 Mg Tbec (Pantoprazole sodium) .Marland Kitchen.. 1 tablet by mouth once a day 12)  Risperdal 2 Mg Tabs (Risperidone) .... Take 1 tablet by mouth at bedtime 13)  Synthroid 75 Mcg Tabs (Levothyroxine sodium) .... Take 1 tablet by mouth once a day 14)  Zetia 10 Mg Tabs (Ezetimibe) .Marland Kitchen.. 1 tablet by mouth once a day 15)  Spiriva Handihaler 18 Mcg Caps (Tiotropium bromide monohydrate) .... Q.s. 30 days - one capsule once a day 16)  Cortomycin 3.5-10000-1 Susp (Neomycin-polymyxin-hc) .... 4 drops in both ears four times a day for 10 days 17)  A/b Otic 1.4-5.4 % Soln (Benzocaine-antipyrine) .... 4 drops in each ear up to four times a day for pain relief.  Other Orders: [facility]- Est Level  3 ([address])  Diabetes Management  Assessment/Plan:      The following lipid goals have been established for the patient: Total cholesterol goal of 200; LDL cholesterol goal of 100; HDL cholesterol goal of 40; Triglyceride goal of 150.  Her blood pressure goal is < 130/80.     Patient Instructions: 1)  Please return to see me in two weeks for a recheck of your ears and discussion about your diabetes.   Prescriptions: A/B OTIC 1.4-5.4 %  SOLN (BENZOCAINE-ANTIPYRINE) 4 drops in each ear up to four times a day for pain relief.  #1 x 0   Entered and Authorized by:   Towana Badger MD   Signed by:   Towana Badger MD on 11/09/2007   Method used:   Electronically sent to ...       [facility] [phone]*       [address], [place]  [address]       Ph: [phone] or [phone]       Fax: [phone]   RxID:   [id number] CORTOMYCIN 3.5-10000-1  SUSP [facility]) 4 drops in both ears four times a day for 10 days  #1 x 0   Entered and Authorized by:   Towana Badger MD   Signed by:   Towana Badger MD on 11/09/2007   Method used:   Electronically sent to ...       [facility] [phone]*       [address]       [place], [place]  [address]       Ph: [phone] or [phone]       Fax: [phone]   RxID:   [id number]  ]

## 2010-08-05 NOTE — Consult Note (Signed)
Summary: [facility]   Imported By: Bradly Bienenstock 06/12/2008 15:55:32  _____________________________________________________________________  External Attachment:    Type:   Image     Comment:   External Document

## 2010-08-05 NOTE — Letter (Signed)
Summary: Probation Letter  [facility]  [address]   [place], [place] [address]   Phone: [phone]  Fax: [phone]    06/03/2009  Bryant KEILANA MORLOCK [address] [place], [place]  [address]  Dear Ms. SLUTSKY,  With the goal of better serving all our patients the [facility] is following each patient's missed appointments.  You have missed at least 3 appointments with our practice.If you cannot keep your appointment, we expect you to call at least 24 hours before your appointment time.  Missing appointments prevents other patients from seeing [place] and makes it difficult to provide you with the best possible medical care.      1.   If you miss one more appointment, we will only give you limited medical services. This means we will not call in medication refills, complete a form, or make a referral for you except when you are here for a scheduled office visit.    2.   If you miss 2 or more appointments in the next year, we will dismiss you from our practice.    Our office staff can be reached at [phone] Monday through Friday from 8:30 a.m.-5:00 p.m. and will be glad to schedule your appointment as necessary.    Thank you.   [facility]  Appended Document: Probation Letter Mailed.

## 2010-08-05 NOTE — Assessment & Plan Note (Signed)
Summary: DNKA   DPG   

## 2010-08-05 NOTE — Assessment & Plan Note (Signed)
Summary: low back pain, dysuria/ls   Vital Signs:  Patient Profile:   60 Years Old Female Height:     62.5 inches Weight:      165.1 pounds Temp:     98.3 degrees F Pulse rate:   96 / minute BP sitting:   102 / 74  (left arm)  Pt. in pain?   yes    Location:   lower back    Intensity:   8    Type:       sharp  Vitals Entered ByJacki Cones RN (August 30, 2007 3:59 PM)                  Chief Complaint:  lower back pain and pressure with urination.  Acute Visit History:      The patient complains of abdominal pain and genitourinary symptoms.  She denies constipation, diarrhea, fever, and rash.  The abdominal pain has been present for 5 days.  It is located in the suprapubic region.  The pain is described as a dull ache.  The intensity is described as a 2 on a scale of 1-10.  She notes a history of dysuria.  She denies bloody stools, constipation, pain worse with movement, urinary frequency, urinary hesitancy, or urinary retention.        She denies dyspareunia or vaginal discharge.           Current Allergies: * CDN LISINOPRIL (LISINOPRIL)    Risk Factors:  Tobacco use:  current    Cigarettes:  Yes -- 1/4 pack(s) per day    Counseled to quit/cut down tobacco use:  yes Exercise:  no  Family History Risk Factors:    Family History of MI in females < 31 years old:  yes    Family History of MI in males < 24 years old:  no  Mammogram History:    Date of Last Mammogram:  08/04/2006  PAP Smear History:    Date of Last PAP Smear:  09/12/2006    Physical Exam  Awake, alert, no distress.  Pt is responsive and intelligible.  No icterus, jaundice or JVD.  Heart sounds are regular and without murmur, thrill or PMI displacement.  Normal work of breathing with lungs clear to auscultation and percussion.  Abdomen is soft, mildly tender suprapubically and nondistended.  Normal bowel sounds.  Extremities are warm and well perfused.     Impression &  Recommendations:  Problem # 1:  DYSURIA (ICD-788.1) Assessment: Deteriorated UA positive.  Cx sent.  RTC 2 weeks for review of symptoms.  Orders: Urinalysis-[facility] (00000)  Her updated medication list for this problem includes:    Macrobid 100 Mg Caps (Nitrofurantoin monohyd macro) .Marland Kitchen... Take one (1) by mouth twice a day   Problem # 2:  TOBACCO DEPENDENCE (ICD-305.1) Assessment: Unchanged Advised cessation.  Complete Medication List: 1)  Albuterol 90 Mcg/act Aers (Albuterol) .... Inhale 2 puff using inhaler every four hours 2)  Ativan 0.5 Mg Tabs (Lorazepam) .... Take 1 tablet by mouth twice a day 3)  Bayer Childrens Aspirin 81 Mg Chew (Aspirin) .... Take 1 tablet by mouth once a day 4)  Crestor 40 Mg Tabs (Rosuvastatin calcium) .... Take 1 tablet by mouth once a day 5)  Depakote 250 Mg Tbec (Divalproex sodium) .... Take 6)  Hydrochlorothiazide 25 Mg Tabs (Hydrochlorothiazide) .... Take 1 tablet by mouth every morning 7)  Metformin Hcl 500 Mg Tabs (Metformin hcl) .... Take 1 tablet by mouth once a day  8)  Nasacort Aq 55 Mcg/act Aers (Triamcinolone acetonide(nasal)) .... Spray 2 spray into both nostrils once a day 9)  Norvasc 5 Mg Tabs (Amlodipine besylate) .... Take 1 tablet by mouth once a day 10)  Optichamber Advantage Misc (Spacer/aero-holding chambers) 11)  Protonix 40 Mg Tbec (Pantoprazole sodium) .Marland Kitchen.. 1 tablet by mouth once a day 12)  Risperdal 2 Mg Tabs (Risperidone) .... Take 1 tablet by mouth at bedtime 13)  Synthroid 75 Mcg Tabs (Levothyroxine sodium) .... Take 1 tablet by mouth once a day 14)  Zetia 10 Mg Tabs (Ezetimibe) .Marland Kitchen.. 1 tablet by mouth once a day 15)  Spiriva Handihaler 18 Mcg Caps (Tiotropium bromide monohydrate) .... Q.s. 30 days - one capsule once a day 16)  Macrobid 100 Mg Caps (Nitrofurantoin monohyd macro) .... Take one (1) by mouth twice a day  Other Orders: Mammogram (Screening) (Mammo)   Patient Instructions: 1)  Please schedule a follow-up  appointment in 2 weeks.    Prescriptions: MACROBID 100 MG CAPS (NITROFURANTOIN MONOHYD MACRO) Take one (1) by mouth twice a day  #14 x 0   Entered and Authorized by:   Towana Badger MD   Signed by:   Towana Badger MD on 08/30/2007   Method used:   Electronically sent to ...       [facility] [phone][facility]       [address], [place]  [address]       Ph: [phone] or [phone]       Fax: [phone]   RxID:   [id number]  ] Laboratory Results   Urine Tests  Date/Time Received: August 30, 2007 4:04 PM  Date/Time Reported: August 30, 2007 5:01 PM   Routine Urinalysis   Color: yellow Appearance: Cloudy Glucose: negative   (Normal Range: Negative) Bilirubin: small   (Normal Range: Negative) Ketone: negative   (Normal Range: Negative) Spec. Gravity: 1.020   (Normal Range: 1.003-1.035) Blood: large   (Normal Range: Negative) pH: 6.0   (Normal Range: 5.0-8.0) Protein: >=300   (Normal Range: Negative) Urobilinogen: negative   (Normal Range: 0-1) Nitrite: negative   (Normal Range: Negative) Leukocyte Esterace: large   (Normal Range: Negative)  Urine Microscopic WBC/hpf: loaded RBC/hpf: greater than 20 Bacteria: 2+ so thick Epithelial: 1-5    Comments: TEST PERFORMED BY BONITA PATTERSON, CMA STUDENT ...................................................................DONNA [facility]  August 30, 2007 5:01 PM     Appended Document: Orders Update    Clinical Lists Changes  Orders: Added new Test order of [facility]- Est  Level 4 ([address]) - Signed

## 2010-08-05 NOTE — Miscellaneous (Signed)
Summary: Remove old dx (epic preparation)  Clinical Lists Changes  Problems: Removed problem of CONTACT OR EXPOSURE TO OTHER VIRAL DISEASES (ICD-V01.79) Removed problem of SEXUALLY TRANSMITTED DISEASE, EXPOSURE TO (ICD-V01.6) Removed problem of AMENORRHEA (ICD-626.0) Removed problem of SCOLIOSIS NEC (ICD-737.39) Removed problem of VAGINITIS (ICD-616.10) Removed problem of HERNIA, HIATAL, NONCONGENITAL (ICD-553.3) Removed problem of CHRONIC OBSTRUCTIVE PULMONARY DISEASE, ACUTE EXACERBATION (ICD-491.21) Removed problem of BRONCHITIS, CHRONIC (ICD-491.9)

## 2010-08-05 NOTE — Miscellaneous (Signed)
  Clinical Lists Changes  Observations: Added new observation of PAP DUE: 09/2009 (09/12/2006 16:36) Added new observation of PAP SMEAR: normal (09/12/2006 16:36)     Preventive Care Screening  Pap Smear:    Date:  09/12/2006    Next Due:  09/2009    Results:  normal

## 2010-08-05 NOTE — Assessment & Plan Note (Signed)
Summary: UTI   Vital Signs:  Patient profile:   60 year old female Height:      62.5 inches Weight:      156.3 pounds BMI:     28.23 Temp:     98.1 degrees F oral Pulse rate:   85 / minute BP sitting:   110 / 77  (right arm) Cuff size:   regular  Vitals Entered By: Gladstone Pih (July 09, 2009 11:22 AM) CC: dysuria Is Patient Diabetic? Yes Did you bring your meter with you today? No Pain Assessment Patient in pain? yes     Location: Flank pain Intensity: 10 Type: sharp   Primary Care Provider:  Marisue Ivan  MD  60yo F w/ dysuria.  History of Present Illness: 60yo F w/ dysuria  Dysuria: x 4 days.  No fevers but has had chills.  Increase frequency without hematuria.  No N/V or flank pain.  Still able to eat and drink.  Habits & Providers  Alcohol-Tobacco-Diet     Tobacco Status: current     Tobacco Counseling: to quit use of tobacco products     Cigarette Packs/Day: 0.5  Allergies: 1)  * Cdn 2)  Lisinopril (Lisinopril)  Review of Systems       No fevers but has had chills.  Increase frequency without hematuria.  No N/V or flank pain.  Still able to eat and drink.  Physical Exam  General:  VS reviewed.  Well appearing, NAD.   Msk:  no CVA tenderness to palpation   Impression & Recommendations:  Problem # 1:  UTI (ICD-599.0) Assessment New Hx and exam and UA c/w acute UTI.  No signs of pyelo.  Will treat with Bactrim DS two times a day x 7 days instead of 3 days b/c history of recurrent symptoms in the past.  Will obtain urine culture as well.     The following medications were removed from the medication list:    Metronidazole 500 Mg Tabs (Metronidazole) .Marland Kitchen... 1 tab by mouth two times a day x 7 days Her updated medication list for this problem includes:    Bactrim Ds 800-160 Mg Tabs (Sulfamethoxazole-trimethoprim) .Marland Kitchen... 1 tab by mouth two times a day x 7 days  Orders: Urine Culture-[facility] ([id number]) [facility]- Est Level  3 ([address])  Complete  Medication List: 1)  Albuterol 90 Mcg/act Aers (Albuterol) .... Inhale 2 puff using inhaler every four hours 2)  Ativan 0.5 Mg Tabs (Lorazepam) .... Take 1 tablet by mouth twice a day 3)  Bayer Childrens Aspirin 81 Mg Chew (Aspirin) .... Take 1 tablet by mouth once a day 4)  Crestor 40 Mg Tabs (Rosuvastatin calcium) .... Take 1 tablet by mouth once a day for high cholesterol 5)  Depakote 250 Mg Tbec (Divalproex sodium) .... Take four times daily 6)  Hydrochlorothiazide 25 Mg Tabs (Hydrochlorothiazide) .... Take 1 tablet by mouth daily for blood pressure 7)  Metformin Hcl 500 Mg Tabs (Metformin hcl) .... Take 1 tablet by mouth once a day 8)  Nasacort Aq 55 Mcg/act Aers (Triamcinolone acetonide(nasal)) .... Spray 2 spray into both nostrils once a day 9)  Norvasc 5 Mg Tabs (Amlodipine besylate) .... Take 1 tablet by mouth once a day 10)  Omeprazole 40 Mg Cpdr (Omeprazole) .... One tablet by mouth two times a day 11)  Risperdal 2 Mg Tabs (Risperidone) .... Take 1 tablet by mouth at bedtime 12)  Synthroid 75 Mcg Tabs (Levothyroxine sodium) .... Take 1 tablet by mouth once a  day for hypothyroidism 13)  Tylenol Arthritis Pain 650 Mg Cr-tabs (Acetaminophen) .... One tablet by mouth three times a day for arthritis (medically necessary) 14)  Bactrim Ds 800-160 Mg Tabs (Sulfamethoxazole-trimethoprim) .Marland Kitchen.. 1 tab by mouth two times a day x 7 days  Other Orders: Urinalysis-[facility] (00000)  Patient Instructions: 1)  I want you to call me in 1 week and let me know if you're better or not. Prescriptions: BACTRIM DS 800-160 MG TABS (SULFAMETHOXAZOLE-TRIMETHOPRIM) 1 tab by mouth two times a day x 7 days  #14 x 0   Entered and Authorized by:   Marisue Ivan  MD   Signed by:   Marisue Ivan  MD on 07/09/2009   Method used:   Electronically to        [facility] [address] [phone]* (retail)       [address]       [place], [place]  [address]       Ph: [phone]       Fax: [phone]   RxID:    [id number]     Laboratory Results   Urine Tests  Date/Time Received: July 09, 2009 11:36 AM  Date/Time Reported: July 09, 2009 11:50 AM   Routine Urinalysis   Color: yellow Appearance: Cloudy Glucose: negative   (Normal Range: Negative) Bilirubin: negative   (Normal Range: Negative) Ketone: trace (5)   (Normal Range: Negative) Spec. Gravity: 1.015   (Normal Range: 1.003-1.035) Blood: large   (Normal Range: Negative) pH: 6.5   (Normal Range: 5.0-8.0) Protein: 100   (Normal Range: Negative) Urobilinogen: 0.2   (Normal Range: 0-1) Nitrite: negative   (Normal Range: Negative) Leukocyte Esterace: large   (Normal Range: Negative)  Urine Microscopic WBC/HPF: >20 RBC/HPF: >20 Bacteria/HPF: 1+ Epithelial/HPF: 0-3    Comments: ...........test performed by...........Marland KitchenTerese Door, CMA

## 2010-08-05 NOTE — Progress Notes (Signed)
Summary: refill  Phone Note Refill Request Call back at Home Phone [phone] Message from:  Patient on July 17, 2008 11:13 AM  Refills Requested: Medication #1:  HYDROCHLOROTHIAZIDE 25 MG TABS Take 1 tablet by mouth every morning   Dosage confirmed as above?Dosage Confirmed   Brand Name Necessary? No   Supply Requested: 1 month  Method Requested: Electronic Initial call taken by: Dedra Skeens CMA,,  July 17, 2008 11:13 AM Caller: Patient Summary of Call: need refill [facility] Methoformin hcl 500mg  Initial call taken by: De Nurse,  July 17, 2008 10:56 AM    called pt. rx's were sent to [facility]. Marland KitchenTHEKLA SLADE CMA,  July 17, 2008 4:28 PM

## 2010-08-05 NOTE — Progress Notes (Signed)
Summary: requesting wi appt with pcp  Phone Note Call from Patient Call back at Home Phone [phone]   Reason for Call: Talk to Nurse Summary of Call: pt is requesting a wi appt with Dr. Mannie Stabile, she sts Dr. Mannie Stabile called her this morning to find out why she missed her appt, pt sts Dr. Mannie Stabile told her to call and have someone schedule an appt that was convenient for her. Looked up phone call that Dr. Mannie Stabile made and it did not indicate anything re: a wi appt. Advised pt RN would verify with Dr. Mannie Stabile and call her back Initial call taken by: ERIN LEVAN,  May 25, 2007 8:59 AM  Follow-up for Phone Call        attempted to call pt back . no answer. Follow-up by: [name],  May 25, 2007 9:15 AM  Additional Follow-up for Phone Call Additional follow up Details #1::        Returning call. Additional Follow-up by: Haydee Salter,  May 25, 2007 10:20 AM    Additional Follow-up for Phone Call Additional follow up Details #2::    attempted to call back again. phone rings but no answer. Follow-up by: [name],  May 25, 2007 10:43 AM  spoke with pt and appointment is scheduled for 05/30/07 . pt is doubled booked with Dr. Pleas Koch ok.[name]

## 2010-08-05 NOTE — Miscellaneous (Signed)
Summary: prior auth  Clinical Lists Changes prior auth for crestor faxed to [facility] withh all info completed.Golden Circle RN  October 08, 2009 3:16 PM  Appended Document: prior auth approved & faxed to pharmacy

## 2010-08-05 NOTE — Assessment & Plan Note (Signed)
Summary: F/u   Vital Signs:  Patient Profile:   60 Years Old Female Weight:      166.8 pounds O2 Sat:      98 % Temp:     97.8 degrees F Pulse rate:   75 / minute BP sitting:   130 / 82  (left arm)  Pt. in pain?   no  Vitals Entered By: Dedra Skeens CMA, (September 27, 2006 8:37 AM) Oxygen therapy Room Air                Chief Complaint:  F/u.  History of Present Illness: 60 y/o AAF with:  COPD:  The patient has had multiple exacerbations in the last two months.  She has been seen by Dr Shona Simpson, the pulmonologist.  She continues to smoke.  She has been better for the last two weeks or so, but is starting to have some productive phlegm cough again.  Only currently taking albuterol as needed.  HTN:  THe pt's BP is under borderline control today. She is on Norvasc 5 and HCTZ 25.  She is allergic to ACEI.  Denies Chest pain, dyspnea on exertion, dizziness, fatique.  Her cardiologist recently suggested a stress test that she declined because she was afraid of a treadmill.   HL:  On Crestor 40. Recently restarted after having one week that she ran out.  Last LDL elevated at >130.  Denies every trying Zetia in the past.  Seasonal Allergies:  Never tried flonase because she was worried about bloody nose.  Her daughter has had good luck with nasacort, so she is interested in trying this.  She is having some increased rhinorrhea recently. Denies fever.  Mild headaches.  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  Sx improved.  Dyspareunia:  Never took estrogen cream because she was afraid of getting cancer, but she is not having sex very frequently at all, and vaginal dryness isn't bothering her right now.  SMoking: Continues to smoke 5 cigarettes per day.  Temporarily quit smoking when she was sick with COPD.  Her main issue is habitual craving at times like with dinner, and while having a drink.     Past Medical History:    h/o etoh abuse - quit 1997-98    Tardive  Dyskinesisa  Past Surgical History:    Bilat Knee surgeries - 07/19/2001    cardiolite - normal - 09/22/1999    echo--nl LV function.  EF 55-60% - 01/02/1999    egd - gastritis, duodenitis - 06/03/1996    MRI - no prolactinoma - 12/03/1998    PFT - mild obstruction - 11/02/1998   Family History:    Brothers with DM, HTN.    Mom- h/o 4 MI`s--1st in 45`s, died Sep 13, 2001.    sister - breast ca  Social History:    smokes 5cigarettes per day.  Mother died 09/13/01.  Lives alone and has one sexual partner periodically.     Physical Exam  General:     obese. alert, well-nourished, and well-hydrated.   Head:     Normocephalic and atraumatic without obvious abnormalities. No apparent alopecia or balding. Eyes:     No corneal or conjunctival inflammation noted. EOMI. Perrla. Funduscopic exam benign, without hemorrhages, exudates or papilledema. Vision grossly normal. Ears:     Right TM is clear with a round white opacity in the middle of the TM suspicious for scar tissue.  Left TM clear Mouth:     large left  sided appliance in place. Neck:     No deformities, masses, or tenderness noted. Chest Wall:     No deformities, masses, or tenderness noted. Lungs:     Normal respiratory effort, chest expands symmetrically. Lungs are clear to auscultation, no crackles or wheezes. Heart:     Normal rate and regular rhythm. S1 and S2 normal without gallop, murmur, click, rub or other extra sounds. Abdomen:     obese, soft, Nontender Skin:     Intact without suspicious lesions or rashes    Impression & Recommendations:  Problem # 1:  HYPERCHOLESTEROLEMIA (ICD-272.0) Continue Crestor 40 for now.  Will probably need Zetia, but we will see based upon her next labs. Orders: Comp Met-[facility] [phone]) Lipid-[facility] ([id number]) [facility]- Est  Level 4 (62952)   Problem # 2:  TOBACCO DEPENDENCE (ICD-305.1) Current smoking not physiological dependence, but rather habitual.  She will need to quit this small  amount by altering her habits.  Medicine won't help this.  Orders: [facility]- Est  Level 4 (84132) ENT Referral (ENT)   Problem # 3:  RHINITIS, ALLERGIC (ICD-477.9) Nasacort given. Orders: [facility]- Est  Level 4 (44010) ENT Referral (ENT)   Problem # 4:  HYPOTHYROIDISM, UNSPECIFIED (ICD-244.9)  Orders: TSH-[facility] ([id number])   Problem # 5:  HYPERTENSION, BENIGN SYSTEMIC (ICD-401.1) Continue current meds Orders: [facility]- Est  Level 4 (34742)   Problem # 6:  DIABETES MELLITUS II, UNCOMPLICATED (ICD-250.00) Well Controlled as of last A1c. Orders: [facility]- Est  Level 4 (59563)   Problem # 7:  COPD (ICD-496) Refill on albuterol with spacer.  Given Flovent 220 micrograms BID Orders: [facility]- Est  Level 4 (87564)   Problem # 8:  ANEMIA, IRON DEFICIENCY, UNSPEC. (ICD-280.9)  Orders: CBC-[facility] (33295)   Problem # 9:  BIPOLAR DISORDER (ICD-296.7)  Orders: Valproic Acid-[facility] ([id number])   Other Orders: HIV-[facility] ([id number]) RPR-[facility] ([id number])

## 2010-08-05 NOTE — Progress Notes (Signed)
Summary: [facility] call  Phone Note Outgoing Call Call back at Home Phone [phone]   Call placed by: Towana Badger MD,  May 25, 2007 8:53 AM Call placed to: Patient Action Taken: Phone Call Completed Summary of Call: Discussed multiple DNKAs.  We discussed the [facility] policy.  She noted several lat-minute problems with making her appointments.  We spent 55m on the phone.  She understands that if she misses another appt without notice, she cannot return to the practice.

## 2010-08-05 NOTE — Assessment & Plan Note (Signed)
Summary: f/u abd pain, renal insufficiency, and htn   Vital Signs:  Patient profile:   60 year old female Weight:      156.2 pounds Temp:     98.1 degrees F oral Pulse rate:   116 / minute Pulse rhythm:   regular BP sitting:   106 / 74  Vitals Entered By: Loralee Pacas CMA (Nov 17, 2009 3:00 PM) CC: abd pain Is Patient Diabetic? Yes Comments pt needs bp meds and stomach meds   Primary Care Provider:  Marisue Ivan  MD  CC:  abd pain.  History of Present Illness: 60yo F c/o abd pain  Abd pain: LUQ pain that is different from prior abd pain.  x 4-5 months.  Sometimes it radiates to the RUQ.  Worsened with EtOH intake resulting in emesis.  States that she has been drinking more EtOH recently b/c of stresses at home.  Also worsened by spicy food and milk.  No fevers or chills.  Endorses diarrhea (nonbloody).  Has been without omeprazole x 1 week.  Habits & Providers  Alcohol-Tobacco-Diet     Tobacco Status: current     Tobacco Counseling: to quit use of tobacco products     Cigarette Packs/Day: 0.5  Current Medications (verified): 1)  Proventil Hfa 108 (90 Base) Mcg/act Aers (Albuterol Sulfate) .... 2 Puffs Q4h As Needed For Wheezing 2)  Ativan 0.5 Mg Tabs (Lorazepam) .... Take 1 Tablet By Mouth Twice A Day 3)  Bayer Childrens Aspirin 81 Mg Chew (Aspirin) .... Take 1 Tablet By Mouth Once A Day 4)  Crestor 40 Mg Tabs (Rosuvastatin Calcium) .... Take 1 Tablet By Mouth Once A Day For High Cholesterol 5)  Depakote 250 Mg Tbec (Divalproex Sodium) .... Take Four Times Daily 6)  Hydrochlorothiazide 25 Mg Tabs (Hydrochlorothiazide) .... Take 1 Tablet By Mouth Daily For Blood Pressure 7)  Metformin Hcl 500 Mg Tabs (Metformin Hcl) .... Take 1 Tablet By Mouth Once A Day 8)  Nasacort Aq 55 Mcg/act Aers (Triamcinolone Acetonide(Nasal)) .... Spray 2 Spray Into Both Nostrils Once A Day 9)  Norvasc 5 Mg Tabs (Amlodipine Besylate) .... Take 1 Tablet By Mouth Once A Day 10)  Omeprazole 40  Mg Cpdr (Omeprazole) .... One Tablet By Mouth Two Times A Day As Needed For Reflux Symptoms 11)  Risperdal 2 Mg Tabs (Risperidone) .... Take 1 Tablet By Mouth At Bedtime 12)  Synthroid 75 Mcg Tabs (Levothyroxine Sodium) .... Take 1 Tablet By Mouth Once A Day For Hypothyroidism 13)  Prodigy Blood Glucose Test  Strp (Glucose Blood) .... Check Blood Sugar Up To 3 Times A Day Disp: 1 Month Supply  Allergies (verified): 1)  * Cdn 2)  Lisinopril (Lisinopril)  Social History: Packs/Day:  0.5  Review of Systems      See HPI  Physical Exam  General:  VS Reviewed. Well appearing, NAD.  Lungs:  Normal respiratory effort, chest expands symmetrically. Lungs are clear to auscultation, no crackles or wheezes. Heart:  Normal rate and regular rhythm. S1 and S2 normal without gallop, murmur, click, rub or other extra sounds. Abdomen:  Soft, NT, ND, no HSM, active BS no rebound, no guarding  Skin:  nl color and turgor   Impression & Recommendations:  Problem # 1:  ABDOMINAL PAIN, CHRONIC (ICD-789.00) Assessment Deteriorated Abd pain is a bit different than prior complaints.  Now it is located in the LUQ and associated with EtOH intake. More concern for subclinical pancreatitis. Advised to avoid fatty foods and EtOH.  Will check Lipase level. Also suspicious for mild to moderate gastritis b/c of worsened with certain foods.  Will restart the omeprazole at high dose. Will f/u in the next 4-6 weeks if the interventions don't result in improvement of symptoms.  Orders: Lipase-[facility] ([id number]) [facility]- Est  Level 4 (13086)  Problem # 2:  RENAL INSUFFICIENCY, ACUTE (ICD-585.9) Assessment: Unchanged Recheck Cr today.  Orders: Basic Met-[facility] ([id number]) [facility]- Est  Level 4 (28413)  Problem # 3:  HYPERTENSION, BENIGN SYSTEMIC (ICD-401.1) Assessment: Unchanged At goal (<140/90). No changes to regimen.  Her updated medication list for this problem includes:    Hydrochlorothiazide 25 Mg Tabs  (Hydrochlorothiazide) .Marland Kitchen... Take 1 tablet by mouth daily for blood pressure    Norvasc 5 Mg Tabs (Amlodipine besylate) .Marland Kitchen... Take 1 tablet by mouth once a day  Orders: [facility]- Est  Level 4 (99214)  Complete Medication List: 1)  Proventil Hfa 108 (90 Base) Mcg/act Aers (Albuterol sulfate) .... 2 puffs q4h as needed for wheezing 2)  Ativan 0.5 Mg Tabs (Lorazepam) .... Take 1 tablet by mouth twice a day 3)  Bayer Childrens Aspirin 81 Mg Chew (Aspirin) .... Take 1 tablet by mouth once a day 4)  Crestor 40 Mg Tabs (Rosuvastatin calcium) .... Take 1 tablet by mouth once a day for high cholesterol 5)  Depakote 250 Mg Tbec (Divalproex sodium) .... Take four times daily 6)  Hydrochlorothiazide 25 Mg Tabs (Hydrochlorothiazide) .... Take 1 tablet by mouth daily for blood pressure 7)  Metformin Hcl 500 Mg Tabs (Metformin hcl) .... Take 1 tablet by mouth once a day 8)  Nasacort Aq 55 Mcg/act Aers (Triamcinolone acetonide(nasal)) .... Spray 2 spray into both nostrils once a day 9)  Norvasc 5 Mg Tabs (Amlodipine besylate) .... Take 1 tablet by mouth once a day 10)  Omeprazole 40 Mg Cpdr (Omeprazole) .... One tablet by mouth two times a day as needed for reflux symptoms 11)  Risperdal 2 Mg Tabs (Risperidone) .... Take 1 tablet by mouth at bedtime 12)  Synthroid 75 Mcg Tabs (Levothyroxine sodium) .... Take 1 tablet by mouth once a day for hypothyroidism 13)  Prodigy Blood Glucose Test Strp (Glucose blood) .... Check blood sugar up to 3 times a day disp: 1 month supply  Patient Instructions: 1)  Please schedule a follow-up appointment as needed if this does not improve after restarting the omeprazole, and cutting back on the alcohol and fatty foods. Prescriptions: OMEPRAZOLE 40 MG CPDR (OMEPRAZOLE) one tablet by mouth two times a day as needed for reflux symptoms  #180 x 1   Entered and Authorized by:   Marisue Ivan  MD   Signed by:   Marisue Ivan  MD on 11/17/2009   Method used:   Electronically to          [facility] [address] [phone]* (retail)       [address]       [place], [place]  [address]       Ph: [phone]       Fax: [phone]   RxID:   [id number] HYDROCHLOROTHIAZIDE 25 MG TABS (HYDROCHLOROTHIAZIDE) Take 1 tablet by mouth daily for blood pressure  #90 Tablet x 1   Entered and Authorized by:   Marisue Ivan  MD   Signed by:   Marisue Ivan  MD on 11/17/2009   Method used:   Electronically to        [facility] [address] [phone]* (retail)  [address]       [place], [place]  [address]       Ph: [phone]       Fax: [phone]   RxID:   [id number]

## 2010-08-05 NOTE — Assessment & Plan Note (Signed)
Summary: DNKA,AG      Current Allergies: * CDN LISINOPRIL (LISINOPRIL)        Complete Medication List: 1)  Albuterol 90 Mcg/act Aers (Albuterol) .... Inhale 2 puff using inhaler every four hours 2)  Ativan 0.5 Mg Tabs (Lorazepam) .... Take 1 tablet by mouth twice a day 3)  Bayer Childrens Aspirin 81 Mg Chew (Aspirin) .... Take 1 tablet by mouth once a day 4)  Crestor 40 Mg Tabs (Rosuvastatin calcium) .... Take 1 tablet by mouth once a day 5)  Depakote 250 Mg Tbec (Divalproex sodium) .... Take 6)  Hydrochlorothiazide 25 Mg Tabs (Hydrochlorothiazide) .... Take 1 tablet by mouth every morning 7)  Metformin Hcl 500 Mg Tabs (Metformin hcl) .... Take 1 tablet by mouth once a day 8)  Nasacort Aq 55 Mcg/act Aers (Triamcinolone acetonide(nasal)) .... Spray 2 spray into both nostrils once a day 9)  Norvasc 5 Mg Tabs (Amlodipine besylate) .... Take 1 tablet by mouth once a day 10)  Optichamber Advantage Misc (Spacer/aero-holding chambers) 11)  Protonix 40 Mg Tbec (Pantoprazole sodium) .Marland Kitchen.. 1 tablet by mouth once a day 12)  Risperdal 2 Mg Tabs (Risperidone) .... Take 1 tablet by mouth at bedtime 13)  Synthroid 75 Mcg Tabs (Levothyroxine sodium) .... Take 1 tablet by mouth once a day 14)  Zetia 10 Mg Tabs (Ezetimibe) .Marland Kitchen.. 1 tablet by mouth once a day 15)  Spiriva Handihaler 18 Mcg Caps (Tiotropium bromide monohydrate) .... Q.s. 30 days - one capsule once a day     ]

## 2010-08-05 NOTE — Progress Notes (Signed)
Summary: Pt cancelled appt today in advance - not a DNKA.  ---- Converted from flag ---- ---- 11/22/2007 8:45 AM, DELORES PATE CMA, wrote: pt cancelled appt this am due to her court ordered appearance in court. She wanted message given so you would not dismiss her from the practice.  Advised she having problems with daughter.  Advising to" tell Dr Mannie Stabile so he won't dismiss me." ------------------------------  Randie Heinz!  She called and cancelled the appointment, which is exactly what I've been trying to get her to do.  Now, she called the same morning, but this is an improvement.  For the record, I would consider today to be a cancelled appointment, not a DNKA.  Appended Document: Pt cancelled appt today in advance - not a DNKA. pt had to cancel appt again today for court.

## 2010-08-05 NOTE — Progress Notes (Signed)
Summary: Rx Prob  Phone Note Call from Patient   Caller: Patient Summary of Call: on pt's rx said that it has to say medically necessary for medicaid to pay for it. Initial call taken by: Clydell Hakim,  April 14, 2009 2:35 PM  Follow-up for Phone Call        will send message to MD. Follow-up by: [name],  April 14, 2009 2:37 PM  Additional Follow-up for Phone Call Additional follow up Details #1::        changed to omeprazole per medicaid Additional Follow-up by: Marisue Ivan  MD,  April 14, 2009 2:39 PM

## 2010-08-05 NOTE — Progress Notes (Signed)
Summary: change from protonix to omeprazole b/c of medicaid  Phone Note Call from Patient Call back at [phone]   Caller: Patient Summary of Call: pt states that medicaid will not pay for protonix anymore and needs another kind of meds - her stomach alot since then. they said they have faxed this but have not heard from [place]. [facility] [place] Initial call taken by: De Nurse,  April 13, 2009 3:44 PM  Follow-up for Phone Call        Checked Dr. Sherryll Burger box and it is there waiting for his signature.  Will route this note to remind him.  Called pt to let her know and only got her VM.  LVMM for her to call me back. Follow-up by: Dennison Nancy RN,  April 13, 2009 3:55 PM  Additional Follow-up for Phone Call Additional follow up Details #1::        to pcp Additional Follow-up by: Golden Circle RN,  April 14, 2009 8:53 AM    Additional Follow-up for Phone Call Additional follow up Details #2::    Changed from protonix to omeprazole b/c of medicaid. Follow-up by: Marisue Ivan  MD,  April 14, 2009 2:27 PM  New/Updated Medications: OMEPRAZOLE 40 MG CPDR (OMEPRAZOLE) one tablet by mouth daily (before breakfast) Prescriptions: OMEPRAZOLE 40 MG CPDR (OMEPRAZOLE) one tablet by mouth daily (before breakfast)  #34 x 2   Entered and Authorized by:   Marisue Ivan  MD   Signed by:   Marisue Ivan  MD on 04/14/2009   Method used:   Electronically to        [facility] [phone]* (retail)       [address]       [place], [place]  [address]       Ph: [phone]       Fax: [phone]   RxID:   [id number]

## 2010-08-05 NOTE — Assessment & Plan Note (Signed)
Summary: GERD exacerbation   Vital Signs:  Patient Profile:   60 Years Old Female Height:     62.5 inches Weight:      160.5 pounds BMI:     28.99 Pulse rate:   84 / minute BP sitting:   120 / 70  (left arm) Cuff size:   large  Pt. in pain?   no  Vitals Entered By: Arlyss Repress CMA, (June 30, 2008 10:03 AM)                  PCP:  Marisue Ivan  MD  Chief Complaint:  1.) f/up stomach problems. needs protonix rx. 2.) dysuria for a few days. 3.) refill metformin.  History of Present Illness: 60yo F c/o abd discomfort, dysuria, and needing refills on medications.  Abd discomfort: Localized to epigastric region.  Described as burning pain typically after meals.  No N/V/diarrhea or bloody stools.  No fevers.  No change in appetite.  Hx of GERD.  Currently on Omeprazole and wants a different medication.  Dysuria: Painful urine for a few days.  No hematuria.    Medication refills: States that she has not received her refills.  Reports that she has changed pharmacies but failed to tell us.    DM II: Still taking metformin without side effects.    Current Allergies: * CDN LISINOPRIL (LISINOPRIL)     Review of Systems      See HPI   Physical Exam  General:     Overweight, AAF, extremely sensitive to her body with multiple complaints, NAD Lungs:     Normal respiratory effort, chest expands symmetrically. Lungs are clear to auscultation, no crackles or wheezes. Heart:     Normal rate and regular rhythm. S1 and S2 normal without gallop, murmur, click, rub or other extra sounds. Abdomen:     Obese, soft, NT, ND, no HSM, +BS Msk:     no CVA tenderness Extremities:     no edema Psych:     Pan positive of all symptoms;     Impression & Recommendations:  Problem # 1:  DYSURIA (ICD-788.1) Assessment: New UA was nl; no indication of UTI; will observe without treatment   Orders: Urinalysis-[facility] (00000) [facility]- Est  Level 4 ([address])   Problem # 2:   ABDOMINAL PAIN, GENERALIZED (ICD-789.07) Assessment: Deteriorated Most likely GERD exacerbation not adequately controlled on Omeprazole.  Will switch to Protonix 40mg  two times a day per patient request.  Will reassess in 2 months.  Educated and counseled on avoiding triggers.   Orders: Comp Met-[facility] [id number]) Colonoscopy (Colon) [facility]- Est  Level 4 (91478)   Problem # 3:  Preventive Health Care (ICD-V70.0) Assessment: Comment Only Will schedule her for colonoscopy and provide flu vaccination.  Will check a CMET to check LFTs and renal function since she has been on the Crestor and HCTZ.     Orders: Colonoscopy (Colon)   Problem # 4:  DIABETES MELLITUS II, UNCOMPLICATED (ICD-250.00) Assessment: Unchanged A1c- 6.1%.  No change to medication.   Her updated medication list for this problem includes:    Bayer Childrens Aspirin 81 Mg Chew (Aspirin) .Marland Kitchen... Take 1 tablet by mouth once a day    Metformin Hcl 500 Mg Tabs (Metformin hcl) .Marland Kitchen... Take 1 tablet by mouth once a day  Orders: A1C-[facility] ([address]) [facility]- Est  Level 4 (13086)   Complete Medication List: 1)  Albuterol 90 Mcg/act Aers (Albuterol) .... Inhale 2 puff using inhaler every four hours 2)  Ativan 0.5 Mg Tabs (Lorazepam) .... Take 1 tablet by mouth twice a day 3)  Bayer Childrens Aspirin 81 Mg Chew (Aspirin) .... Take 1 tablet by mouth once a day 4)  Crestor 40 Mg Tabs (Rosuvastatin calcium) .... Take 1 tablet by mouth once a day 5)  Depakote 250 Mg Tbec (Divalproex sodium) .... Take four times daily 6)  Hydrochlorothiazide 25 Mg Tabs (Hydrochlorothiazide) .... Take 1 tablet by mouth every morning 7)  Metformin Hcl 500 Mg Tabs (Metformin hcl) .... Take 1 tablet by mouth once a day 8)  Nasacort Aq 55 Mcg/act Aers (Triamcinolone acetonide(nasal)) .... Spray 2 spray into both nostrils once a day 9)  Norvasc 5 Mg Tabs (Amlodipine besylate) .... Take 1 tablet by mouth once a day 10)  Protonix 40 Mg Tbec (Pantoprazole sodium) ....  One tablet by mouth two times a day 11)  Risperdal 2 Mg Tabs (Risperidone) .... Take 1 tablet by mouth at bedtime 12)  Synthroid 75 Mcg Tabs (Levothyroxine sodium) .... Take 1 tablet by mouth once a day  Other Orders: Influenza Vaccine NON MCR (32951)   Patient Instructions: 1)  Please schedule a follow-up appointment in 2 months. 2)  Today we switched your antacid medication.   3)  We are going to schedule you for a colonoscopy given your age and family history of colon cancer. 4)  Your urine test was normal.   Prescriptions: CRESTOR 40 MG TABS (ROSUVASTATIN CALCIUM) Take 1 tablet by mouth once a day  #90 x 1   Entered and Authorized by:   Marisue Ivan  MD   Signed by:   Marisue Ivan  MD on 06/30/2008   Method used:   Electronically to        [facility] [phone]* (retail)       [address]       [place], [place]  [address]       Ph: [phone]       Fax: [phone]   RxID:   [id number] SYNTHROID 75 MCG TABS (LEVOTHYROXINE SODIUM) Take 1 tablet by mouth once a day  #90 x 1   Entered and Authorized by:   Marisue Ivan  MD   Signed by:   Marisue Ivan  MD on 06/30/2008   Method used:   Electronically to        [facility] [phone]* (retail)       [address] [place] [address]       [place], [place]  [address]       Ph: [phone]       Fax: [phone]   RxID:   [id number] HYDROCHLOROTHIAZIDE 25 MG TABS (HYDROCHLOROTHIAZIDE) Take 1 tablet by mouth every morning  #90 x 1   Entered and Authorized by:   Marisue Ivan  MD   Signed by:   Marisue Ivan  MD on 06/30/2008   Method used:   Electronically to        [facility] [phone]* (retail)       [address]       [place], [place]  [address]       Ph: [phone]       Fax: [phone]   RxID:   [id number] PROTONIX 40 MG TBEC (PANTOPRAZOLE SODIUM) one tablet by mouth two times a day  #60 x 3   Entered and Authorized by:   Marisue Ivan  MD   Signed by:    Marisue Ivan  MD on 06/30/2008  Method used:   Electronically to        [facility] [phone]* (retail)       [address]       [place], [place]  [address]       Ph: [phone]       Fax: [phone]   RxID:   [id number] METFORMIN HCL 500 MG TABS (METFORMIN HCL) Take 1 tablet by mouth once a day  #90 x 1   Entered and Authorized by:   Marisue Ivan  MD   Signed by:   Marisue Ivan  MD on 06/30/2008   Method used:   Electronically to        [facility] [address] [phone]* (retail)       [address]       [place], [place]  [address]       Ph: [phone]       Fax: [phone]   RxID:   [id number] PROTONIX 40 MG TBEC (PANTOPRAZOLE SODIUM) one tablet by mouth two times a day  #60 x 3   Entered and Authorized by:   Marisue Ivan  MD   Signed by:   Marisue Ivan  MD on 06/30/2008   Method used:   Electronically to        [facility] [phone]* (retail)       [address] [place], [place]  [address]       Ph: [phone] or [phone]       Fax: [phone]   RxID:   [id number]  ] Last Flu Vaccine:  Fluvax 3+ (07/11/2007 9:43:51 AM) Flu Vaccine Result Date:  06/30/2008 Flu Vaccine Result:  given Flu Vaccine Next Due:  1 yr   Laboratory Results   Urine Tests  Date/Time Received: June 30, 2008 10:07 AM  Date/Time Reported: June 30, 2008 10:19 AM   Routine Urinalysis   Color: yellow Appearance: Clear Glucose: negative   (Normal Range: Negative) Bilirubin: negative   (Normal Range: Negative) Ketone: negative   (Normal Range: Negative) Spec. Gravity: <1.005   (Normal Range: 1.003-1.035) Blood: negative   (Normal Range: Negative) pH: 6.0   (Normal Range: 5.0-8.0) Protein: negative   (Normal Range: Negative) Urobilinogen: 0.2   (Normal Range: 0-1) Nitrite: negative   (Normal Range: Negative) Leukocyte Esterace: negative   (Normal Range: Negative)    Comments: ...............test performed  by......Marland KitchenBonnie A. [place], [place] (ASCP)   Blood Tests   Date/Time Received: June 30, 2008 10:07 AM  Date/Time Reported: June 30, 2008 10:19 AM   HGBA1C: 6.1%   (Normal Range: Non-Diabetic - 3-6%   Control Diabetic - 6-8%)  Comments: ...............test performed by......Marland KitchenBonnie A. [place], [place] (ASCP)       Influenza Vaccine    Vaccine Type: Fluvax Non-MCR    Site: left deltoid    Mfr: GlaxoSmithKline    Dose: 0.5 ml    Route: IM    Given by: Arlyss Repress CMA,    Exp. Date: 12/31/2008    Lot #: [id number]    VIS given: 01/25/07 version given June 30, 2008.  Flu Vaccine Consent Questions    Do you have a history of severe allergic reactions to this vaccine? no    Any prior history of allergic reactions to egg and/or gelatin? no    Do you have a sensitivity to the preservative Thimersol? no    Do you have a past history of Guillan-Barre Syndrome? no  Do you currently have an acute febrile illness? no    Have you ever had a severe reaction to latex? no    Vaccine information given and explained to patient? yes    Are you currently pregnant? no

## 2010-08-05 NOTE — Progress Notes (Signed)
  Phone Note Call from Patient Call back at Home Phone [phone]   Caller: Patient Call For: On call physician Summary of Call: Did not have ear drops for fungus infection or pain medicine called in to correct pharmacy and does not have transportation to the [facility]. Will send Rx for ear drops in to [facility] on [address].  Initial call taken by: Alanda Amass MD,  Nov 11, 2007 12:11 PM      Prescriptions: CORTOMYCIN 3.5-10000-1  SUSP (NEOMYCIN-POLYMYXIN-HC) 4 drops in both ears four times a day for 10 days  #1 x 0   Entered and Authorized by:   Alanda Amass MD   Signed by:   Alanda Amass MD on 11/11/2007   Method used:   Electronically sent to ...       [facility]  [address] [phone]*       [address]       [place], [place]  [address]       Ph: [phone]       Fax: [phone]   RxID:   [id number] A/B OTIC 1.4-5.4 %  SOLN (BENZOCAINE-ANTIPYRINE) 4 drops in each ear up to four times a day for pain relief.  #1 x 0   Entered and Authorized by:   Alanda Amass MD   Signed by:   Alanda Amass MD on 11/11/2007   Method used:   Electronically sent to ...       [facility]  [address] [phone]*       [address]       [place], [place]  [address]       Ph: [phone]       Fax: [phone]   RxID:   [id number]

## 2010-08-05 NOTE — Progress Notes (Signed)
Summary: Triage  Phone Note Call from Patient Call back at Home Phone [phone]   Summary of Call: Needs to speak with someone about yellow mucus and bleeding nose. Initial call taken by: Haydee Salter,  August 09, 2007 11:44 AM  Follow-up for Phone Call        states she is getting over the flu. she was concerned over blood tinged mucus. assured her this was not unusual when blowing nose. she thinks she needs antibiotics because it is yellow. states she cannot keep her appt tomorrow-family member is having surgery. Has transportation issues & has to call 24hrs in advance for a ride. Gave her a work in spot for monday Follow-up by: Golden Circle RN,  August 09, 2007 11:48 AM

## 2010-08-05 NOTE — Progress Notes (Signed)
Summary: Rx  Phone Note Call from Patient Call back at Home Phone [phone]   Summary of Call: needs [place] to fax [facility] for albuterol to go into machine. fax number is [phone]. Initial call taken by: Haydee Salter,  August 11, 2008 10:11 AM  Follow-up for Phone Call        Pt states she needs a refill on her albuterol nebulizer solution rxd by Dr. Sherene Sires.  States she had a very large supply, and has 2 vials left, only uses 1-2 per week or when she is sick.  States her breathing feels a bit tight today.  Advised Dr. Burnadette Pop will not be able to refill this med as he did not rx it for her originally - scheduled her for [place] appt today.  Also she gave a lengthy explaination and wanted me to tell Dr. Burnadette Pop that she does not think her breathing problems are related to smoking because she does not smoke "that much".  States she thinks she just gets colds frequently. 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

## 2010-08-05 NOTE — Progress Notes (Signed)
Summary: resch  Phone Note Call from Patient Call back at Home Phone [phone]   Caller: Patient Summary of Call: had to resch due to having to go get her daughter from school (was suspended) Initial call taken by: De Nurse,  September 07, 2009 11:24 AM

## 2010-08-05 NOTE — Progress Notes (Signed)
Summary: resc  Phone Note Call from Patient Call back at Home Phone [phone]   Caller: Patient Summary of Call: pt is upset b/c she has to go get daughter out of jail - will not be able to make appt today Initial call taken by: De Nurse,  January 11, 2010 9:10 AM

## 2010-08-05 NOTE — Progress Notes (Signed)
Summary: requesting pcp change  Phone Note Call from Patient Call back at Home Phone [phone]   Reason for Call: Talk to Nurse Summary of Call: pt is requesting pcp change, would prefer female md, husband also wants female md, [name] Long dob [birth year], Initial call taken by: Knox Royalty,  January 03, 2008 3:31 PM  Follow-up for Phone Call        FWD. TO DENYA Follow-up by: Arlyss Repress CMA,,  January 03, 2008 4:02 PM  Additional Follow-up for Phone Call Additional follow up Details #1::        Will change both to Dr. Sheffield Slider. Additional Follow-up by: Dennison Nancy RN,  January 22, 2008 9:40 AM         Appended Document: requesting pcp change Dr. Sheffield Slider unable to take any new patients, have reassigned to Dr. Burnadette Pop.

## 2010-08-05 NOTE — Assessment & Plan Note (Signed)
Summary: DNKA               

## 2010-08-05 NOTE — Assessment & Plan Note (Signed)
Summary: DJD, cerumen impaction, DM- controlled   Vital Signs:  Patient profile:   60 year old female Weight:      157.4 pounds BMI:     28.43 Temp:     98.3 degrees F oral Pulse rate:   78 / minute BP sitting:   85 / 59  (left arm)  Vitals Entered By: Alphia Kava (February 17, 2009 3:35 PM) CC: low back pain and ear pain Is Patient Diabetic? No   Primary Care Provider:  Marisue Ivan  MD  CC:  low back pain and ear pain.  History of Present Illness: 60yo F c/o low back pain and ear pain  Back pain: Chronic issue.  No recent trauma or instigating event.  Has a documented hx of DJD and scoliosis documented on previous xrays.  Only pain medication she is on is a daily baby aspirin.  No radiating pain into the legs and no numbness or paresthesia.  No urinary or bowel incontinence.  Pain not hindering daily tasks.  Ear pain: Duration uncertain.  L>R.  No bleeding or discharge or fever.  No change in hearing.    DM: Doing well on Metformin.  No adverse side effects.  No hypoglycemic episodes or symptoms.    Habits & Providers  Alcohol-Tobacco-Diet     Tobacco Status: never  Allergies: 1)  * Cdn 2)  Lisinopril (Lisinopril)  Social History: Smoking Status:  never  Review of Systems      See HPI  Physical Exam  General:  VS reviewed.  Non ill appearing, NAD Ears:  L ear: 75% blockage of TM deep in the canal w/ cerumen; no erythema, bleeding, or bulging of visible TM R ear: TM nl appearing, no erythema, bulging, or drainage; canal clear of cerumen Mouth:  Oral mucosa and oropharynx without lesions or exudates.   Lungs:  Normal respiratory effort, chest expands symmetrically. Lungs are clear to auscultation, no crackles or wheezes. Heart:  Normal rate and regular rhythm. S1 and S2 normal without gallop, murmur, click, rub or other extra sounds. Abdomen:  Bowel sounds positive,abdomen soft and non-tender without masses, organomegaly or hernias noted. Msk:  no spinal  tenderness to palpation mild ttp of paraspinal muscles ROM of back not easily tested b/c of patient compliance pt able to get on and off the exam table w/o difficulty Small joints w/o effusion or erythema   Impression & Recommendations:  Problem # 1:  CERUMEN IMPACTION, LEFT (ICD-380.4) Assessment New Nursing staff able to irrigate some of the cerumen from the left ear canal.  Not able to adequately remove the cerumen manually w/ fear of penetrating the TM.  Advised to go to the drug store and pick up ear wax softner and follow manufacturer instructions and to avoid use of q-tips in the ear.  No signs of infection.     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  Advised to take scheduled tylenol...reviewed last LFTs.  Advised to inc activity.    Problem # 3:  HYPERTENSION, BENIGN SYSTEMIC (ICD-401.1) Assessment: Comment Only Low today but asymptomatic.  Previous BPs have been at goal therefore, no change in regimen.   Her updated medication list for this problem includes:    Hydrochlorothiazide 25 Mg Tabs (Hydrochlorothiazide) .Marland Kitchen... Take 1 tablet by mouth every morning    Norvasc 5 Mg Tabs (Amlodipine besylate) .Marland Kitchen... Take 1  tablet by mouth once a day  Orders: [facility]- Est  Level 4 ([address])  Problem # 4:  DIABETES MELLITUS II, UNCOMPLICATED (ICD-250.00) Assessment: Unchanged At goal.  Ideally would have her on an ACE-I but her BP regimen is working for her and w/ the low BP today, not safe to add another agent.     Her updated medication list for this problem includes:    Bayer Childrens Aspirin 81 Mg Chew (Aspirin) .Marland Kitchen... Take 1 tablet by mouth once a day    Metformin Hcl 500 Mg Tabs (Metformin hcl) .Marland Kitchen... Take 1 tablet by mouth once a day  Orders: A1C-[facility] ([address]) [facility]- Est  Level 4 ([address]11)  Problem # 5:  Preventive Health Care  (ICD-V70.0) Assessment: Comment Only She is going to let us know when she wants to schedule her colonoscopy.  Complete Medication List: 1)  Albuterol 90 Mcg/act Aers (Albuterol) .... Inhale 2 puff using inhaler every four hours 2)  Ativan 0.5 Mg Tabs (Lorazepam) .... Take 1 tablet by mouth twice a day 3)  Bayer Childrens Aspirin 81 Mg Chew (Aspirin) .... Take 1 tablet by mouth once a day 4)  Crestor 40 Mg Tabs (Rosuvastatin calcium) .... Take 1 tablet by mouth once a day 5)  Depakote 250 Mg Tbec (Divalproex sodium) .... Take four times daily 6)  Hydrochlorothiazide 25 Mg Tabs (Hydrochlorothiazide) .... Take 1 tablet by mouth every morning 7)  Metformin Hcl 500 Mg Tabs (Metformin hcl) .... Take 1 tablet by mouth once a day 8)  Nasacort Aq 55 Mcg/act Aers (Triamcinolone acetonide(nasal)) .... Spray 2 spray into both nostrils once a day 9)  Norvasc 5 Mg Tabs (Amlodipine besylate) .... Take 1 tablet by mouth once a day 10)  Protonix 40 Mg Tbec (Pantoprazole sodium) .... One tablet by mouth two times a day 11)  Risperdal 2 Mg Tabs (Risperidone) .... Take 1 tablet by mouth at bedtime 12)  Synthroid 75 Mcg Tabs (Levothyroxine sodium) .... Take 1 tablet by mouth once a day 13)  Tylenol Arthritis Pain 650 Mg Cr-tabs (Acetaminophen) .... One tablet by mouth three times a day for arthritis (medically necessary)  Other Orders: Urinalysis-[facility] (00000)  Patient Instructions: 1)  Please schedule a follow-up appointment as needed .  2)  Pick up ear wax softner at the drug store and follow the manufacturer instructions.  Don't use q-tips. 3)  Start taking tylenol as prescribed for osteoarthritis. 4)  Let our nursing staff know when you want the colonoscopy. Prescriptions: TYLENOL ARTHRITIS PAIN 650 MG CR-TABS (ACETAMINOPHEN) one tablet by mouth three times a day for arthritis (medically necessary)  #100 x 3   Entered and Authorized by:   Marisue Ivan  MD   Signed by:   Marisue Ivan  MD on  02/17/2009   Method used:   Electronically to        [facility] [phone]* (retail)       [address]       [place], [place]  [address]       Ph: [phone]       Fax: [phone]   RxID:   [id number]   Laboratory Results   Urine Tests  Date/Time Received: February 17, 2009 4:27 PM  Date/Time Reported: February 17, 2009 4:53 PM   Routine Urinalysis   Color: yellow Appearance: Clear Glucose: negative   (Normal Range: Negative) Bilirubin: small;  reflex to ictotest = negative   (Normal Range: Negative) Ketone: negative   (Normal Range: Negative)  Spec. Gravity: 1.020   (Normal Range: 1.003-1.035) Blood: negative   (Normal Range: Negative) pH: 6.0   (Normal Range: 5.0-8.0) Protein: 30   (Normal Range: Negative) Urobilinogen: 1.0   (Normal Range: 0-1) Nitrite: negative   (Normal Range: Negative) Leukocyte Esterace: negative   (Normal Range: Negative)  Urine Microscopic WBC/HPF: rare RBC/HPF: rare Bacteria/HPF: 2+ Epithelial/HPF: occ Casts/LPF: 10 - 20 hyaline;  10 - 20 granular    Comments: ...............test performed by......Marland KitchenBonnie A. [place], [place] (ASCP)   Blood Tests   Date/Time Received: February 17, 2009 3:38 PM  Date/Time Reported: February 17, 2009 4:01 PM   HGBA1C: 6.1%   (Normal Range: Non-Diabetic - 3-6%   Control Diabetic - 6-8%)  Comments: ...............test performed by......Marland KitchenBonnie A. [place], [place] (ASCP)        Prevention & Chronic Care Immunizations   Influenza vaccine: given  (06/30/2008)   Influenza vaccine due: 06/30/2009    Tetanus booster: 08/04/1998: Done.   Tetanus booster due: 08/04/2008    Pneumococcal vaccine: Pneumovax  (07/11/2007)   Pneumococcal vaccine due: None  Colorectal Screening   Hemoccult: Done.  (03/04/2005)   Hemoccult due: 03/04/2006    Colonoscopy: Not documented  Other Screening   Pap smear: Normal  (12/24/2007)   Pap smear due: 01/2009    Mammogram: Normal  (10/22/2007)   Mammogram due:  11/2008   Smoking status: never  (02/17/2009)  Diabetes Mellitus   HgbA1C: 6.1  (02/17/2009)   Hemoglobin A1C due: 05/31/2008    Eye exam: normal  (01/02/2007)   Eye exam due: 01/02/2008    Foot exam: yes  (08/26/2008)   Foot exam action/deferral: Deferred   High risk foot: Not documented   Foot care education: Not documented   Foot exam due: 02/28/2009    Urine microalbumin/creatinine ratio: Not documented   Urine microalbumin action/deferral: Not indicated   Urine microalbumin/cr due: 01/10/2008    Diabetes flowsheet reviewed?: Yes   Progress toward A1C goal: At goal   Diabetes comments: Will need to obtain eye exam in the near future w/ her opthalmologist.  Lipids   Total Cholesterol: 225  (09/27/2006)   LDL: 80  (12/11/2006)   LDL Direct: 88  (01/10/2007)   HDL: 40  (09/27/2006)   Triglycerides: 269  (09/27/2006)    SGOT (AST): 13  (06/30/2008)   SGPT (ALT): 9  (06/30/2008)   Alkaline phosphatase: 74  (06/30/2008)   Total bilirubin: 0.2  (06/30/2008)  Hypertension   Last Blood Pressure: 85 / 59  (02/17/2009)   Serum creatinine: 1.02  (06/30/2008)   Serum potassium 3.8  (06/30/2008)    Hypertension flowsheet reviewed?: Yes   Progress toward BP goal: Deteriorated   Hypertension comments: Low BP reading but asymptomatic.    Self-Management Support :    Diabetes self-management support: Not documented    Hypertension self-management support: Not documented    Lipid self-management support: Not documented

## 2010-08-05 NOTE — Progress Notes (Signed)
Summary: Metformin 500mg  qd #90 x1  Phone Note Refill Request Call back at [phone] Message from:  Patient  Refills Requested: Medication #1:  METFORMIN HCL 500 MG TABS Take 1 tablet by mouth once a day PT NEEDS NEW RX FOR THIS DUE TO LOSING HER BOTTLE WHEN SHE WAS ON A TRIP.  PT WILL BE AWAY FROM FROM PHONE UNTIL 2PM TODAY.  Initial call taken by: Clydell Hakim,  June 22, 2009 11:30 AM  Follow-up for Phone Call        will forward  to MD. Follow-up by: [name],  June 22, 2009 12:12 PM    New/Updated Medications: METFORMIN HCL 500 MG TABS (METFORMIN HCL) Take 1 tablet by mouth once a day Prescriptions: METFORMIN HCL 500 MG TABS (METFORMIN HCL) Take 1 tablet by mouth once a day  #90 x 1   Entered and Authorized by:   Marisue Ivan  MD   Signed by:   Marisue Ivan  MD on 06/22/2009   Method used:   Electronically to        [facility] [phone]* (retail)       [address]       [place], [place]  [address]       Ph: [phone]       Fax: [phone]   RxID:   [id number]
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
## 2010-08-05 NOTE — Assessment & Plan Note (Signed)
Summary: cyst on vulva per susan saxon/dpg   Vital Signs:  Patient Profile:   60 Years Old Female Height:     62.5 inches Weight:      161.6 pounds Temp:     98.2 degrees F Pulse rate:   84 / minute BP sitting:   99 / 70  Pt. in pain?   yes    Location:   "all over"    Intensity:   7  Vitals Entered By: Altamese Dilling CMA, (December 19, 2006 10:11 AM)                Chief Complaint:  Cyst on vulva.  History of Present Illness: several mpnths of small lesion at edge of vagina. Somewhat irritating at times. No bleeding or pain. No discharge. No hx of abnormal paps. Post menopausal        Physical Exam  Genitalia:     introitus and bimanual exam normal with no external lesions other than small 5 mm round fluctuant, non painful syst at 6:00 of vaginal opening. Very mild atrophic changes externally. No vagional discharge.  Pt given informed consent which included optioj to do nothing for this benign mucous cyst. She opted for removal/decompression. Under sterile conditions 1 cc of 2% lidocaine w epi was instilled and then teh cyst was opened with a single stab of an 11 blade scalpel. A small amount of milky think fluid was expressed. A non adherent pad was given to pt who tolerated procedure well.     Patient Instructions: 1)  Warm sitz baths two times a day for 2 days. Call with any swelling, pain or discharge. Should heal fully in 2 days with no sequelae, but it may recur.

## 2010-08-10 ENCOUNTER — Ambulatory Visit: Payer: Self-pay | Admitting: Family Medicine

## 2010-08-11 NOTE — Miscellaneous (Signed)
Summary: New Meter  Clinical Lists Changes  Scripts are ready for pickup.  Cannot electronically prescribe meter so have written prescriptions.  Medications: Added new medication of * ACCUCHECK AVIVA METER Added new medication of * ACCUCHECK AVIVA TEST STRIPS - Signed Removed medication of PRODIGY PREFERRED MONITOR  DEVI (BLOOD GLUCOSE MONITORING SUPPL) Use to check blood suger 3-4 times per day Removed medication of PRODIGY BLOOD GLUCOSE TEST  STRP (GLUCOSE BLOOD) check blood sugar up to 3 times a day Disp: 1 month supply Rx of ACCUCHECK AVIVA TEST STRIPS ;  #60 x 1;  Signed;  Entered by: Majel Homer MD;  Authorized by: Majel Homer MD;  Method used: Handwritten    Prescriptions: ACCUCHECK AVIVA TEST STRIPS   #60 x 1   Entered and Authorized by:   Majel Homer MD   Signed by:   Majel Homer MD on 08/03/2010   Method used:   Handwritten   RxID:   [id number]

## 2010-08-11 NOTE — Progress Notes (Signed)
Summary: Rx  Phone Note Call from Patient Call back at Home Phone [phone]   Reason for Call: Talk to Doctor Summary of Call: pt asking for a new rx for her glucose meter & strips, her other one does not work, pt apologized for missing appts, has been sick. also asking for refill on omeprazole. pt goes to [facility].  pt said when she feels better she will make another appt.  Initial call taken by: Knox Royalty,  August 02, 2010 12:05 PM  Follow-up for Phone Call        Will send in omeprazole rx.  Pt was given rx for new meter and strips in Sept of 2011.  If this meter is not working, she should take it to the pharmacy where she got it for exchange/replacement.  Her insurance is unlikely to cover a second meter within a single year. Follow-up by: Majel Homer MD,  August 03, 2010 1:46 PM  Additional Follow-up for Phone Call Additional follow up Details #1::        called and informed pt of rx she stated the meter just needed a battery but medicaid would not pay for it so she will need an rx for another one. Additional Follow-up by: Loralee Pacas CMA,  August 03, 2010 2:33 PM    Prescriptions: OMEPRAZOLE 40 MG CPDR (OMEPRAZOLE) one tablet by mouth two times a day as needed for reflux symptoms  #60 x 2   Entered and Authorized by:   Majel Homer MD   Signed by:   Majel Homer MD on 08/03/2010   Method used:   Electronically to        [facility] [address] [phone]* (retail)       [address]       [place], [place]  [address]       Ph: [phone]       Fax: [phone]   RxID:   [id number]

## 2010-08-17 ENCOUNTER — Telehealth: Payer: Self-pay | Admitting: Family Medicine

## 2010-08-17 NOTE — Telephone Encounter (Signed)
After speaking with dr. Louanne Belton pt was informed that she will have to make an appt if she is wanting another referral made. Not going to order another cxr due to 12/14 not being done. Pt will call back up front to schedule

## 2010-08-20 ENCOUNTER — Other Ambulatory Visit: Payer: Self-pay

## 2010-08-27 ENCOUNTER — Ambulatory Visit: Payer: Self-pay | Admitting: Pharmacist

## 2010-08-27 ENCOUNTER — Other Ambulatory Visit: Payer: Self-pay | Admitting: *Deleted

## 2010-08-27 DIAGNOSIS — E7800 Pure hypercholesterolemia, unspecified: Secondary | ICD-10-CM

## 2010-08-27 MED ORDER — ROSUVASTATIN CALCIUM 40 MG PO TABS: 40.0000 mg | ORAL_TABLET | Freq: Every day | ORAL | Status: DC

## 2010-08-31 ENCOUNTER — Ambulatory Visit
Admission: RE | Admit: 2010-08-31 | Discharge: 2010-08-31 | Disposition: A | Discharge status: Home / Self Care | Payer: Medicaid Other | Source: Ambulatory Visit | Attending: Cardiovascular Disease | Admitting: Cardiovascular Disease

## 2010-08-31 ENCOUNTER — Other Ambulatory Visit: Payer: Self-pay | Admitting: Family Medicine

## 2010-08-31 ENCOUNTER — Other Ambulatory Visit: Payer: Self-pay | Admitting: Cardiovascular Disease

## 2010-08-31 DIAGNOSIS — R05 Cough: Secondary | ICD-10-CM

## 2010-08-31 DIAGNOSIS — R059 Cough, unspecified: Secondary | ICD-10-CM

## 2010-08-31 MED ORDER — HYDROCHLOROTHIAZIDE 25 MG PO TABS: 25.0000 mg | ORAL_TABLET | Freq: Every day | ORAL | Status: DC

## 2010-08-31 NOTE — Telephone Encounter (Signed)
Please review and refill

## 2010-08-31 NOTE — Telephone Encounter (Signed)
Refill request

## 2010-09-03 NOTE — Telephone Encounter (Signed)
Patient calls today while at [facility]  requesting referral to be seen.  Consulted Dr. Sheffield Slider , preceptor and then Dr. Louanne Belton and advised that we cannot give ok for referral until she comes in to discuss referral with Dr. Louanne Belton.   Tried to explain that medicaid requires PCP give referral ok and since MD has not seen her about this recently she needs appointment first. She hung up phone.

## 2010-09-23 ENCOUNTER — Encounter: Payer: Self-pay | Admitting: Family Medicine

## 2010-09-23 ENCOUNTER — Ambulatory Visit (INDEPENDENT_AMBULATORY_CARE_PROVIDER_SITE_OTHER): Payer: Medicaid Other | Admitting: Family Medicine

## 2010-09-23 VITALS — BP 124/78 | HR 78 | Wt 155.2 lb

## 2010-09-23 DIAGNOSIS — M1710 Unilateral primary osteoarthritis, unspecified knee: Secondary | ICD-10-CM

## 2010-09-23 DIAGNOSIS — J441 Chronic obstructive pulmonary disease with (acute) exacerbation: Secondary | ICD-10-CM | POA: Insufficient documentation

## 2010-09-23 DIAGNOSIS — IMO0002 Reserved for concepts with insufficient information to code with codable children: Secondary | ICD-10-CM

## 2010-09-23 DIAGNOSIS — J449 Chronic obstructive pulmonary disease, unspecified: Secondary | ICD-10-CM

## 2010-09-23 DIAGNOSIS — M479 Spondylosis, unspecified: Secondary | ICD-10-CM

## 2010-09-23 DIAGNOSIS — W19XXXA Unspecified fall, initial encounter: Secondary | ICD-10-CM | POA: Insufficient documentation

## 2010-09-23 DIAGNOSIS — E119 Type 2 diabetes mellitus without complications: Secondary | ICD-10-CM

## 2010-09-23 HISTORY — DX: Chronic obstructive pulmonary disease, unspecified: J44.9

## 2010-09-23 LAB — POCT GLYCOSYLATED HEMOGLOBIN (HGB A1C): Hemoglobin A1C: 6.2

## 2010-09-23 MED ORDER — TIOTROPIUM BROMIDE MONOHYDRATE 18 MCG IN CAPS: 18.0000 ug | ORAL_CAPSULE | Freq: Every day | RESPIRATORY_TRACT | Status: DC

## 2010-09-23 MED ORDER — BENZONATATE 100 MG PO CAPS: 100.0000 mg | ORAL_CAPSULE | Freq: Three times a day (TID) | ORAL | Status: AC | PRN

## 2010-09-23 MED ORDER — ALBUTEROL SULFATE HFA 108 (90 BASE) MCG/ACT IN AERS: 2.0000 | INHALATION_SPRAY | RESPIRATORY_TRACT | Status: DC | PRN

## 2010-09-23 NOTE — Patient Instructions (Addendum)
It was great to see you today! I will send in a referral for you to see your orthopedist.  They, or we, will call you with an appointment.  I also want to get xrays of your arm and wrist. I am also providing you with a letter so a family member can accompany you to appointments. I am wanting you to start a new inhaler, Spiriva, daily to hopefully help with your breathing.  I would appreciate it if you could make it to an appointment with Dr. Raymondo Band for some breathing tests.  Try to stop taking both your Spiriva and your Albuterol for at least 24 hours before that appointment.

## 2010-09-26 ENCOUNTER — Encounter: Payer: Self-pay | Admitting: Family Medicine

## 2010-09-26 MED ORDER — ALBUTEROL SULFATE HFA 108 (90 BASE) MCG/ACT IN AERS: 2.0000 | INHALATION_SPRAY | RESPIRATORY_TRACT | Status: DC | PRN

## 2010-09-27 ENCOUNTER — Telehealth: Payer: Self-pay | Admitting: *Deleted

## 2010-09-27 NOTE — Telephone Encounter (Signed)
Spoke with patient and informed her of appointment @ [facility] for 10/01/2010 @ 11:30am with Dr. Priscille Kluver.

## 2010-09-29 ENCOUNTER — Encounter: Payer: Self-pay | Admitting: Family Medicine

## 2010-09-29 NOTE — Assessment & Plan Note (Signed)
Will obtain wrist films today to make sure there is not an occult break.  Will recommend ice/rest and gradual return to activity.  Do not think a fracture is likely, but want to rule out due to pt's weight.

## 2010-09-29 NOTE — Assessment & Plan Note (Signed)
Will obtain A1c today to check for control.  Do not plan to make any changes unless is significantly elevated.

## 2010-09-29 NOTE — Progress Notes (Signed)
Subjective: Pt presents today with multiple complaints.  1) Pt is requesting a referral to ortho.  She claims to have been seeing them every six to eight months for cortisone injections in her knees and for monitoring of her need for bilateral knee replacements.  She says that insurance now is requiring an official referral letter.  No new problems with knees.  2) Pt reports several flairs of "breathing difficulties" that she attributes to asthma.  These have been treated with abx and steroids by, apparently, her cardiologist.  She had a flair in December (treated at this clinic), and two other flairs since then.  She currently is not having significant problems and her breathing has returned to her baseline.  3) With her steroid use, she reports some high blood sugars (in the low 200s) and would like her A1c checked.  She continues on her metformin.  4) Pt requests a letter asking Medicare to allow a family member to accompany her in her medicari-sponsered transportation.  She asks for this as she says she gets "parinoid" as part of her mental problems and doesn't trust the people driving her.  She would feel much safer if accompanied by a family member.  5) Pt reports a fall on her right wrist about a week ago.  Reports pain in her forearm, but no change in strength or significant swelling.  Fell on outstretched hand due to tripping.  Then dropped to her knees.  No activity limitation but she is worried that something might be wrong.  Also worried that something is wrong with her neck due to her fall, although she reports no swelling, unusual discomfort, or activity limitation.  Objective:  Filed Vitals:   09/23/10 1322  BP: 124/78  Pulse: 78   GEN: NAD HEENT: Neck has no point tenderness, full ROM, some paraspinous muscle tensness/tenderness diffusely. CV: RRR, no MRG Resp: occasional wheezes, decreased bilaterally but with no focal findings Ext: Right forearm is mildly tender to touch with  no decrease in strength.  No limitation in ROM.  Pain is primarily in the wrist region.  Knees are non-tender, non-erythematous, and non-swoolen.

## 2010-09-29 NOTE — Assessment & Plan Note (Signed)
Will give pt referral to ortho today.  I feel that she would likely benefit from having her knees replaced, if they feel it is indicated at this time.  She does have activity limitation due to discomfort/pain and this makes it hard to address the patient's obesity, hyperlipidemia, and diabetes.

## 2010-09-29 NOTE — Assessment & Plan Note (Addendum)
After review of the patient's records, it does appear that she has COPD, although she claims that her problems are just with asthma and not related to her smoking.  Anyway, she is on albuterol PRN and apears to have had several exacerbations in the past several months.  Will start spiriva today and try to get her back for spirometry.  If she continues to have problems, will consider switching to something like Advair at some point in the future.

## 2010-10-12 ENCOUNTER — Ambulatory Visit: Payer: Medicaid Other | Admitting: Pharmacist

## 2010-10-22 ENCOUNTER — Ambulatory Visit (INDEPENDENT_AMBULATORY_CARE_PROVIDER_SITE_OTHER): Payer: Medicaid Other | Admitting: Pharmacist

## 2010-10-22 ENCOUNTER — Encounter: Payer: Self-pay | Admitting: Pharmacist

## 2010-10-22 DIAGNOSIS — F17200 Nicotine dependence, unspecified, uncomplicated: Secondary | ICD-10-CM

## 2010-10-22 DIAGNOSIS — F319 Bipolar disorder, unspecified: Secondary | ICD-10-CM

## 2010-10-22 DIAGNOSIS — J449 Chronic obstructive pulmonary disease, unspecified: Secondary | ICD-10-CM

## 2010-10-22 MED ORDER — ALBUTEROL SULFATE HFA 108 (90 BASE) MCG/ACT IN AERS: 2.0000 | INHALATION_SPRAY | RESPIRATORY_TRACT | Status: DC | PRN

## 2010-10-22 NOTE — Assessment & Plan Note (Addendum)
A: Abnormal spirometry: moderate restriction, est lung age 60 yrs old- on spiriva and albuterol PRN.  P: Continue spiriva daily and albuterol PRN as before. Counseled on proper indication and technique.  Duration of encounter: 30 mins. See with Clance Boll PharmD and Ivery Quale PharmD Candidate.

## 2010-10-22 NOTE — Progress Notes (Signed)
  Subjective:    Patient ID: [name] Long, female    DOB: December 30, 1950, 60 y.o.   MRN: [id number]  HPI  [name] Long arrives this morning in good spirits. She reports smoking between 8 to 18 cigarettes daily (avg 10 cigarettes) depending on her level of stress. She states she enjoys smoking and not interested in smoking cessation today. She states she has many family members that also smoke, including her daughter and grandaughter. She states she also has been feeling congested since December.     Review of Systems     Objective:   Physical Exam        Assessment & Plan:

## 2010-10-22 NOTE — Assessment & Plan Note (Addendum)
A: mild-mod tobacco dependence (currently smoking 8-18 cigarettes daily); not ready to quit smoking.  P: Reassess readiness to quit at every visit. Decrease use to 6 cigarettes by June 1st.

## 2010-10-22 NOTE — Patient Instructions (Signed)
1) Decrease cigarette use to 6 per day by June 1st. 2) Continue Spiriva inhaler once daily. 3) Continue Proventil 2 puffs every 4 hours as needed for wheezing. 4) Drink a lot of water (6 cups a day) to help with congestion. 5) Follow up in Pharmacy Clinic in early June.

## 2010-10-25 NOTE — Progress Notes (Signed)
  Subjective:    Patient ID: [name] Long, female    DOB: 09/11/1950, 60 y.o.   MRN: [id number]  HPI Reviewed and agree with Dr. Macky Lower management.    Review of Systems     Objective:   Physical Exam        Assessment & Plan:

## 2010-11-08 ENCOUNTER — Other Ambulatory Visit: Payer: Self-pay | Admitting: Family Medicine

## 2010-11-08 DIAGNOSIS — K219 Gastro-esophageal reflux disease without esophagitis: Secondary | ICD-10-CM

## 2010-11-16 NOTE — Assessment & Plan Note (Signed)
[facility]                             PULMONARY OFFICE NOTE   NAME:[name] [name] BULTEMA                     MRN:          [id number]  DATE:01/02/2007                            DOB:          September 13, 1950    PULMONARY FINAL FOLLOW-UP OFFICE VISIT:   HISTORY:  This 60 year old black female with previous pulmonary function  tests indicating no significant air flow obstruction documented in 2003,  but with frequent exacerbations of cough, productive of thick mucus, but  is slightly discolored in the mornings.  She continues to smoke,  actually more than she had the last time we saw her here, now up to one  pack per day.  She is complaining about not being able to get her  medications.  When I asked her exactly what her medications were, she  could not tell me.   She has also been having intermittent abdominal pain, which only comes  on when she eats at [facility] or has pizza.  She says that she controls  this by increasing Protonix at a b.i.d. dosing and says her insurance  will not pay for this any more.   PHYSICAL EXAMINATION:  GENERAL:  She actually cannot put two thoughts  together.  VITAL SIGNS:  She is afebrile with normal vital signs.  HEENT:  Unremarkable.  LUNGS:  Lung fields reveal distant breath sounds with pseudo-wheeze  only.  HEART:  There is a regular rate and rhythm without murmur, gallop or  rub.  ABDOMEN:  Soft, nontender.  EXTREMITIES:  Without calf tenderness.  No cyanosis or clubbing.   A chest x-ray is pending.   Saturation is 97% on room air.   IMPRESSION:  Chronic bronchitis with tendency to intermittent flare-up  of tracheobronchitis in this patient who is actively smoking.  The fact  is that she is smoking, which is giving her adverse effects, and not  consistently taking her medicines, for reasons that I think are largely  pschosocial.  (She has Medicaid, so she should be able to get any  medication that needs to be  prescribed, including using the medication  over-ride forms available through her pharmacy.)  I have struggled with  this patient on the whole concept of medication reconciliation, and  actually lost ground today in terms of understanding anything that she  is doing at home.  Clearly the reason she is smoking and is having no  many psychosocial issues, is related to her bipolar disorder, for which  she sees Dr. Marlyn [name], and today is worse than usual in terms of not  being able to put thoughts together, and I have referred her back to Dr.  Jodi [name] for this issue and also to face the issue of what to do about her  active smoking (for which her lungs are serving as an innocent bystander  and not the culprit).  1. Intermittent abdominal pain, for which she knows if  she avoids      taco and pizza it goes away.  Here is another example of her      making a life style  choice to eat those things that she knows are      bad for her, and then turn around and complain that she cannot get      the medication to offset the life style choices she is making.  I      really have nothing to offer in this regard.   I recommend that she see her regular medical doctor in the Medicine  Clinic at [facility] and provide him a full disclosure  of all medications.  The best we can do for this type of patient is  probably a combination of albuterol and Atrovent per nebulizer q.i.d.  with Mucinex DM 2 b.i.d. p.r.n. cough and congestion, and consider  adding budesonide to the nebulizer if she has frequent exacerbations,  indicating an asthmatic component.  Without smoking cessation and/or a  full commitment to medication reconciliation, however, there is nothing  I can do for her here in the pulmonary clinic.  I did not get the idea  that she was willing to meet be halfway in either of these regards, and  so pulmonary followup was not scheduled.     Charlaine Dalton. [name] Sires, MD, [facility]   Electronically Signed    MBW/MedQ  DD: 01/02/2007  DT: 01/02/2007  Job #: [id number]   cc:   [name] Ora, MD  Marlyn [name], M.D.

## 2010-11-16 NOTE — Assessment & Plan Note (Signed)
[facility]                             PULMONARY OFFICE NOTE   NAME:[name] [name], [name] [name]                     MRN:          [id number]  DATE:11/30/2006                            DOB:          [birth year]    HISTORY OF PRESENT ILLNESS:  Patient is a 60 year old African American  female patient of Dr. Thurston Hole who has a known history of COPD, who  presents today for an acute office visit complaining of a 10-day history  of productive cough with thick yellowish/gray sputum, shortness of  breath, intermittent wheezing.  Patient denies any hemoptysis,  orthopnea, PND, or leg swelling.   PAST MEDICAL HISTORY:  Reviewed.   CURRENT MEDICATIONS:  Reviewed.   PHYSICAL EXAMINATION:  Patient is a pleasant female, in no acute  distress.  She is afebrile with stable vital signs.  Her O2 saturation is 98% on  room air.  HEENT:  Unremarkable.  NECK:  Supple without cervical adenopathy.  No JVD.  LUNG SOUNDS:  Reveal some coarse breath sounds bilaterally with a few  expiratory wheezes.  CARDIAC:  Regular rate.  ABDOMEN:  Soft and non-tender.  EXTREMITIES:  Warm without any calf tenderness, cyanosis, or edema.   IMPRESSION AND PLAN:  Acute chronic obstructive pulmonary disease  exacerbation.  Patient is to begin doxycycline x7 days, Mucinex DM twice  daily, prednisone taper over the next week.  Patient may use Tussionex 4  ounces 1 teaspoon every 12 hours as needed for cough.  Patient aware of  sedating effect.  Patient was given Xopenex nebulizer treatment here in  the office.  Patient will return back here in 4 to 6 weeks with Dr.  Sherene Sires, or sooner if needed.  Patient is encouraged on smoking cessation.      Rubye Oaks, NP  Electronically Signed      Charlaine Dalton. Sherene Sires, MD, [facility]  Electronically Signed   TP/MedQ  DD: 11/30/2006  DT: 11/30/2006  Job #: [id number]

## 2010-11-19 ENCOUNTER — Other Ambulatory Visit: Payer: Self-pay | Admitting: Family Medicine

## 2010-11-19 MED ORDER — GLUCOSE BLOOD VI STRP: ORAL_STRIP | Status: DC

## 2010-11-19 NOTE — H&P (Signed)
[place]  Patient:    [name] [name], [name] [name]                     MRN: [id number] Adm. Date:  [id number] Attending:  Garnette Scheuermann Dictator:   Andrey Spearman, M.D.                         History and Physical  CHIEF COMPLAINT: Chest pain.  HISTORY OF PRESENT ILLNESS: This patient is a 60 year old African-American female with a past medical history as described following in this dictation. The patient reports onset at 0630 this morning of substernal chest pain radiating to both arms.  She describes it as sharp stabbing pain in her chest and a burning and tingling in her arms.  The patient also reports radiation to her back.  She states the pain has lasted all day.  She took a nerve pill, Depakote, a generic pain medication, and aspirin with some relief.  The pain was initially 10/10 at first and caused the patient to rock in pain.  The patient described the pain initially as 7/10; however, after my examination was completed she described the pain as being gone and she was pain-free.  She said she did have some sweating but she has had this in the past, and she was short of breath but she attributed this to her COPD.  She described no new nausea.  She also has a history of COPD and she has been out walking in the cold and now has congestion and cough.  Her cough is productive of brown and clear sputum.  She is using Tussend and has used her albuterol inhaler x 8 this week, usually using it one time per day.  She has never been intubated with her COPD.  REVIEW OF SYSTEMS: She has no fever, no sore throat, no visual changes.  She does have some nausea, however, since changing from Zantac to Protonix, but nothing new.  No dysuria, no melena.  She says she is possibly going through menopause and was recently put on Prempro by Dr. Wiliam Ke.  She also states that she is "full of fluid."  She also says that she has had similar pain as described above one  year ago and she had a negative Cardiolite study, and also had pain one week ago that she did not tell anyone about.  PAST MEDICAL HISTORY:  1. Type 2 diabetes.  No retinopathy, nephropathy, or neuropathy.  2. Hypercholesterolemia.  3. Iron deficiency anemia.  4. Bipolar disorder.  5. COPD.  6. Hypertension.  7. Tobacco abuse.  8. Hiatal hernia.  9. Galactorrhea, status post normal MRI work-up. 10. Cervical spine disorder with unclear diagnosis. 11. History of alcohol abuse. 12. Hypothyroidism.  PAST PROCEDURES:  1. Cardiolite study in March 2001 which was negative.  2. Echocardiogram in July 2000 showed an ejection fraction of 55-60% and     was within normal limits.  3. EGD in December 1997 showed gastritis and duodenitis.  4. PFTs in May 2000 showed mild obstruction.  CURRENT MEDICATIONS:  1. Aspirin 81 mg q.d.  2. Atenolol 100 mg q.d.  3. Ativan 1 mg p.r.n.  4. Combivent 2 puffs q.i.d.  5. Depakote - I believe she is on 250 mg t.i.d. but she is unclear about her     dosage of this medication.  6. Glucophage 500 mg q.d.  7. Hydrochlorothiazide 25 mg q.d.  8.  Lotensin 20 mg q.d.  9. Prempro 0.625/2.5 mg q.d., I believe, but again am unclear on this dosage. 10. Protonix 40 mg q.d. 11. Risperdal 20 mg q.h.s. p.r.n. 12. Serevent two puffs b.i.d. 13. Synthroid 0.075 mg q.d. 14. Albuterol p.r.n. 15. Vioxx 25 mg p.r.n.  ALLERGIES: CODEINE causes nausea.  SOCIAL HISTORY: Positive for tobacco abuse with 30 pack-year history.  She uses occasional alcohol.  She does report that she took a couple of swallows of alcohol tonight to help with the pain.  She is divorced and has four children and two grandchildren.  FAMILY HISTORY: Mother with coronary artery disease and stroke.  Father with coronary artery disease.  Brother with diabetes.  Sister with non-Hodgkins lymphoma.  Another sister with lupus.  Grandmother died of colon cancer.  PHYSICAL EXAMINATION:  VITAL SIGNS:  She is afebrile with temperature of 98.4 degrees.  Blood pressure 143/74, pulse 93, respiratory rate 22.  Oxygen saturation 98% on room air.  GENERAL: She is obese, pleasant, and in no acute distress.  HEENT: Head normocephalic, atraumatic.  PERRL.  TMs clear bilaterally.  Throat clear.  NECK: No lymphadenopathy, no thyromegaly, no bruits.  HEART: Regular rate and rhythm with no murmurs, rubs, or gallops.  She does have tenderness to palpation in the left side of her chest.  PULMONARY: Clear to auscultation bilaterally.  No wheezes, rales or rhonchi.  ABDOMEN: Soft, nontender, nondistended.  Good bowel sounds.  EXTREMITIES: No clubbing, cyanosis, or edema.  Peripheral pulses 2+.  NEUROLOGIC: She is alert and oriented x 3.  Strength 5/5 and symmetric.  LABORATORY DATA: EKG shows normal sinus rhythm with no ischemic changes, EKG within normal limits.  Chest x-ray shows mild vascular congestion possibly but is a portable film and not good inspiration; otherwise, no acute disease.  WBC 4.9, hemoglobin 12, hematocrit 36.2; platelets 154,000; normal Differential.  Sodium 138, potassium 3.4, chloride 108, bicarbonate 21, BUN 13, creatinine 0.8, glucose 103.  AST 14, ALT 13.  Alkaline phosphatase 50, total bilirubin 0.3, total protein 6.2, albumin 3.1, calcium 8.8.  Initial CK was 148, MB is still pending at the time of this dictation.  Troponin 0.02.  ASSESSMENT/PLAN: This patient is a 60 year old with risk factors for coronary artery disease including hypertension, diabetes, family history, tobacco abuse, and hypercholesterolemia.  She did have a negative Cardiolite study less than a year ago and her electrocardiogram is within normal limits, and so far her cardiac enzymes are negative.  The patient does not currently have any risk factors for pulmonary embolus so will not work anything up for this now.  1. Cardiology.  We will go ahead and rule out myocardial infarction with      serial enzymes and electrocardiograms.  The patients last cholesterol was     obtained in January 2001 and showed a total cholesterol of 159, LDL 96,      HDL 35.  The patient reports she was cholesterol medications before but     they were discontinued due to side effects.  We will check a fasting lipid     profile in the morning.  If the patient rules out by enzymes and     electrocardiogram the patient can be discharged tomorrow with outpatient     follow-up.  The patient is already on a beta-blocker and aspirin.  We will     not start heparin at this time.  2. Diabetes.  Continue on home medications and check CBGs q.a.c. and q.h.s.  Her hemoglobin A1C was last checked in February 2002 and was 5.7.  3. Hypertension.  We will go ahead and continue her home medications.  Her     blood pressure is currently under good control.  4. Tobacco abuse.  We will counsel her on smoking cessation as this would be     a risk factor that maybe could be eliminated with counseling.  5. Manic depressive disorder.  We will continue her home medications.DD: 09/06/00 TD:  09/06/00 Job: [id number]

## 2010-11-19 NOTE — Assessment & Plan Note (Signed)
[facility]                             PULMONARY OFFICE NOTE   NAME:[name] [name], [name] [name]                     MRN:          [id number]  DATE:07/18/2006                            DOB:          [birth year]    HISTORY:  [age]-year-old black female who carries a diagnosis of  COPD, but initially on last spirometry on November 2003 had no evidence  of objective airflow obstruction.  She comes in with frequent  exacerbations of coughing with subjective wheezing that improve on  treatment with antibiotics (doxycycline) or short courses of prednisone.  She is concerned because she still has chest pain when she walks.  I  asked her the usual questions about chest pain in as many different ways  as I could, and never got a straight answer in terms of onset, severity,  character, except the fact that it occurs almost always walking,  sometimes when doing housework.  It is not pleuritic in nature.  She  does have generalized chest discomfort with coughing but denies that  cough brings on the pain.  She says that [name] is aware of the  pain and has done multiple tests, which did not find cardiac issue, but  I could not ever get a straight answer from her in terms of whether the  cardiac workup was done relative to the chest pain better.   She comes back today after recent exacerbation of cough, congestion,  subjective wheeze, much better but still actively smoking.   MEDICATIONS:  For a complete inventory of her medications, please see  the face sheet column dated July 18, 2006.   PHYSICAL EXAMINATION:  She is an animated black female who could not  stick to one topic at a time before shifting over to another topic  entirely.  She is afebrile, stable vital signs.  HEENT:  Unremarkable, oropharynx is clear.  LUNGS:  Lung fields reveal diminished breath sounds, but there is no  wheezing.  CARDIAC:  There is a regular rhythm without murmur, gallop or  rub.  ABDOMEN:  Soft, benign.  EXTREMITIES:  Warm without calf tenderness, cyanosis, clubbing or edema.   IMPRESSION:  1. 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      I emphasized to her that smoking cessation would have the greatest      impact in terms of her health care, and if she stopped smoking she      probably could stop many of her pulmonary medicines.  In fact, I do      not believe pulmonary followup would be needed if she stopped      smoking.  If she continues to smoke there will not be anything that      we can do to prevent exacerbations or maintain her lung function at      the same level she has enjoyed to date.  I told her point-blank      that she needs to commit to quit smoking and see her primary care      physician for smoking cessation counseling and/or pharmacologic  therapy for nicotine withdrawal.  However, since she has an      excellent source of primary care we will not be assuming this role      in the Pulmonary Clinic, and would rather see her on a p.r.n. basis      for problems that cannot be handled by her primary care service.  2. Exertional chest pain is worrisome for angina.  I understand she      and [name] have already worked on this problem, and I am going      to defer it therefore totally to Dr. Roseanne Kaufman capable hands, but      warned her that chest pain that is reproducible with exertion is      angina until proven otherwise, and I would not assume it was due to      bronchitis unless it is occurring only with coughing and made      worse by coughing.  Although I tried to explain this as carefully      as possible, the patient never was clear to me that she got the      message I was trying to help her with in terms of distinguishing      cardiac from pulmonary chest pain.     Charlaine Dalton. Sherene Sires, MD, [facility]  Electronically Signed    MBW/MedQ  DD: 07/18/2006  DT: 07/18/2006  Job #: [id number]   cc:   [facility]  Family [facility] Teaching

## 2010-11-19 NOTE — Discharge Summary (Signed)
[place]  Patient:    [name] [name], [name] [name]                     MRN: [id number] Adm. Date:  [id number] Disc. Date: [id number] Attending:  Garnette Scheuermann Dictator:   Michell Heinrich, M.D. CC:         Guadalupe Dawn, M.D., [facility]   Discharge Summary  RESIDENT PHYSICIAN:  Andrey Spearman, M.D.  ADMISSION DIAGNOSIS:  Atypical chest pain.  DISCHARGE DIAGNOSIS:  Atypical chest pain.  CHRONIC PROBLEM LIST:  1. Diabetes mellitus type 2 non-insulin requiring.  2. Hypercholesterolemia.  3. Bipolar disorder.  4. Chronic obstructive pulmonary disease.  5. Hypertension.  6. Tobacco abuse.  7. Hiatal hernia.  8. History of galactorrhea.  9. History of alcohol abuse. 10. Hypothyroidism.  DISCHARGE MEDICATIONS:  1. Advair Diskus 250/50 two puffs b.i.d.  2. Albuterol MDI two puffs every six hours p.r.n.  3. Combivent two puffs q.6h.  4. Aspirin 325 mg per day.  5. Atenolol 100 mg per day.  6. Ativan 1 mg b.i.d. p.r.n.  7. Depakote 250 mg one tab t.i.d.  8. Glucophage 500 mg one tab daily.  9. Hydrochlorothiazide 25 mg per day. 10. Lotensin 20 mg by mouth per day.  CONSULTATIONS:  None.  PROCEDURES:  Pulmonary function tests done on September 06, 2000.  Results revealed slightly worsened obstructive pattern compared to May 2000, also with a slightly restrictive pattern.  HISTORY AND PHYSICAL:  For complete H&P please see resident H&P in chart. Briefly, this was a 60 year old African-American woman with type 2 diabetes, hypertension, tobacco abuse, history of hyperlipidemia, and COPD who was admitted for evaluation of atypical chest pain.  HOSPITAL COURSE: #1 - CHEST PAIN:  The description of the chest pain was sharp substernal pain that was associated with worsening shortness of breath with exertion, cough, and sputum production over the last week.  Although the pain was not typical for angina, the patient has five risk  factors for significant coronary artery disease and therefore, it was decided that she should be admitted and ruled out for myocardial infarction.  Cardiac enzymes were negative x 3.  EKG showed normal sinus rhythm without ST T-wave or Q-wave changes.  After admission to the hospital, the patient did not complain of any significant chest pain.  The description of the chest pain matched more of a COPD picture than cardiac chest pain.  She underwent pulmonary function tests on September 06, 2000 which showed a worsening obstructive pattern compared to her pulmonary function tests in May 2000.  Her Serevent was discontinued and she was placed on Advair Diskus and continued on her Combivent and albuterol.  Of note, she did notice some relief of her chest pain after an administration of the Advair Diskus. Her worsening pulmonary status was discussed with her and the fact that she needed to quit smoking was reinforced.  #2 - TOBACCO CESSATION:  She has apparently tried to quit smoking in the past but it had failed and also tried with the help of a tobacco cessation class; however, she wants to try this again and has asked to be set up at [facility].  This was arranged and she will see the pharmacist, Dr. Paulino Rily, on September 19, 2000 at 9 a.m. for smoking cessation.  DISPOSITION:  The patient was discharged home in improved condition on the previously mentioned discharge medications.  It was recommended that  she gradually increase her activities to normal level, and she would continue with a low-calorie diabetic diet and a low-sodium diet.  Followup with her primary care physician, Dr. Roseanne Reno, was arranged for September 19, 2000 at 2:55 p.m. DD:  09/06/00 TD:  09/07/00 Job: [id number]

## 2010-11-19 NOTE — Discharge Summary (Signed)
[place]. [facility]  Patient:    [name] [name], [name] [name]                       MRN: [id number] Adm. Date:  09/18/99 Disc. Date: 09/20/99 Attending:  Ricki Rodriguez, M.D. Dictator:   Delano Metz, M.D. CC:         Guadalupe Dawn, M.D.                           Discharge Summary  PRIMARY PHYSICIAN:  Guadalupe Dawn, M.D.  DISCHARGE DIAGNOSES: 1. Chest pain. 2. Hypertension. 3. Hypercholesterolemia. 4. Bipolar disorder. 5. Diabetes mellitus type 2. 6. Chronic obstructive pulmonary disease.  DISCHARGE MEDICATIONS:  1. Enteric coated aspirin one each day.  2. Hydrochlorothiazide 12.5 mg one each day.  3. Lipitor 20 mg one p.o. q.d.  4. Lotensin 20 mg one p.o. q.d.  5. Synthroid 0.1 mg one p.o. q.d.  6. Risperdal 2 mg one p.o. q.h.s. p.r.n.  7. Depakote 125 mg three q.h.s.  8. Prevacid 30 mg one p.o. q.d.  9. Atenolol 25 mg one p.o. q.d. 10. Glucophage 500 mg one p.o. q.d.  HISTORY OF PRESENT ILLNESS:  [name] [name] is an 60 year old African-American female with a history of hypercholesterolemia, diabetes type 2, and tobacco abuse, who presented with left-sided chest pain.  The pain was 10/10 at first and then 3/10.  She had radiation to the left shoulder and left side of the neck.  She denied any nausea, vomiting, diaphoresis, or shortness of breath.  HOSPITAL COURSE: #1 - CHEST PAIN:  The patient had multiple risk factors and her history was concerning.  Her initial EKG was normal and labs normal.  She was admitted and ruled out for MI.  A stress Cardiolite on September 20, 1999, was within normal limits.  The patient was started on atenolol for her angina.  It was beta 1 selective given her history of COPD with bronchospasm.  #2 - DIABETES MELLITUS:  Her Glucophage was held during the hospitalization. It will be restarted on discharge .  #3 - HYPERTENSION:  Continued on her home regimen while in the hospital.  The blood pressure was well controlled.  #4  - BIPOLAR DISORDER:  Continue on Depakote during the hospitalization.  #5 - CHRONIC OBSTRUCTIVE PULMONARY DISEASE:  We monitored her O2 saturations, which were adequate.  FOLLOW-UP:  The patient will follow up with Guadalupe Dawn, M.D., on October 04, 1999, at 2:35 p.m.  She is to return to the hospital if she has any significant chest pain. DD:  09/20/99 TD:  09/20/99 Job: [id number]

## 2010-11-19 NOTE — H&P (Signed)
[place]. [facility]  Patient:    [name] [name], [name] [name]                     MRN: [id number] Adm. Date:  [id number] Attending:  Sanjuana Letters Dictator:   Guadalupe Dawn, M.D. CC:         Guadalupe Dawn, M.D., [facility]             Ricki Rodriguez, M.D.                         History and Physical  PROBLEM LIST:  1. Chest pain. Rule out unstable angina/myocardial infarction.  2. History of diabetes mellitus type 2.  3. History of hypertension.  4. History of tobacco abuse.  5. History of chronic obstructive pulmonary disease.  HISTORY OF PRESENT ILLNESS:  This 60 year old African-American female with a history of hypercholesterolemia, diabetes mellitus type 2, tobacco abuse presents with left-sided chest pain. She states the pain began this afternoon and was severe, rated 10/10. The pain was burning in quality and lasted approximately 2  hours without relief. She then took Motrin and had some relief. She denied radiation initially but now complains of radiation to the left shoulder and left side of the neck and also the right chest. No nausea or vomiting, diaphoresis, r shortness of breath. Not associated with or worsened by activity. She was recently told by Ricki Rodriguez, M.D. that she has angina. She also complains of having a weak feeling and feeling fatigued all day.  PAST MEDICAL HISTORY:  1. Diabetes mellitus type 2.  2. Hypercholesterolemia.  3. Iron-deficiency anemia.  4. Bipolar disorder.  5. Tobacco abuse.  6. Hypertension.  7. COPD.  8. History of gastritis/duodenitis.  9. Hiatal hernia.  CURRENT MEDICATIONS:  1. Depakote three tablets at bedtime.  2. Ativan p.r.n.  3. Glucophage 500 mg p.o. q.d.  4. HCTZ 12.5 mg q.d.  5. Lipitor 20 mg q.d.  6. Lotensin 20 mg q.d.  7. Niferex 150 mg q.d.  8. Relafen 750 mg b.i.d.  9. Risperdal 2 mg q.h.s. p.r.n. 10. Synthroid 0.1 mg q.d. 11. Ventolin p.r.n. 12. Zantac 150 mg  q.d.  ALLERGIES:  CODEINE/nausea.  SOCIAL HISTORY:  The patient lives in [place] with her son. Smokes one pack f cigarettes per day for greater than 25 years. 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  FAMILY HISTORY:  Mom with history of four myocardial infarctions, with her first being approximately age 43. She also has two brothers with diabetes mellitus and hypertension.  REVIEW OF SYSTEMS:  Denies palpitations or cough. The rest of the review of systems is negative, except per HPI.  PHYSICAL EXAMINATION:  GENERAL:  She is very well appearing and in no acute distress.  VITAL SIGNS:  Blood pressure 158/60, pulse is 85, O2 saturation is 98% on room ir, temperature 98.9.  HEENT:  NCAT.  PERRLA. EOMI. Oropharynx normal. Tympanic membranes normal bilaterally.  NECK:  Supple. No lymphadenopathy. No JVD. No thyromegaly.  LUNGS:  Clear to auscultation bilaterally. Chest wall nontender.  HEART:  Regular rate and rhythm. No murmurs.  ABDOMEN:  Soft, nontender, nondistended. Normoactive bowel sounds. No hepatosplenomegaly.  EXTREMITIES:  No edema. Normal peripheral pulses.  NEUROLOGICAL:  Grossly intact.  RECTAL:  Heme-negative brown stool. Nontender.  LABORATORY DATA:  Troponin I less than 0.03. CK 159. PTT 29. PT 12.5. INR 0.9.  White blood cell count 6000, hemoglobin 13.8, platelets 159,000. Sodium 138, potassium 3.8, chloride 105, bicarb 24, BUN 14, creatinine 0.9, glucose 95, calcium 9, total protein 6.8, alkaline phosphatase 72, total bilirubin 0.5, ALT 13, AST 21. Urinalysis shows negative glucose, negative ketones, negative leukocyte esterase, negative nitrite, specific gravity less than 1.005.  Chest x-ray shows no acute disease and an elevated right hemidiaphragm. Unchanged from previous x-rays in the year 2000 but not present in x-rays before 1999.  ASSESSMENT AND PLAN:  1. Chest pain. Will admit and rule out for myocardial  infarction. We will contact     her cardiologist Ricki Rodriguez, M.D. for possible medication changes or     further risk stratification. Will add Atenolol as a beta 1 selective beta     blocker. Nitroglycerin p.r.n.  2. Diabetes mellitus. Will hold Glucophage for now and use sliding scale insulin.  3. Hypertension. Continue her home regimen with the addition of Atenolol.  4. Bipolar. Continue Depakote.  5. Chronic obstructive pulmonary disease. Monitor her O2 saturation and treat s     necessary. DD:  09/18/99 TD:  09/19/99 Job: [id number]

## 2010-11-19 NOTE — Assessment & Plan Note (Signed)
[facility]                             PULMONARY OFFICE NOTE   NAME:[name], [name] RINDFLEISCH                     MRN:          [id number]  DATE:09/28/2006                            DOB:          1950/10/24    HISTORY OF PRESENT ILLNESS:  The patient is a 60 year old African-  American female patient of Dr. Thurston Hole who has a known history of COPD,  presents for a two-month followup.  The patient had recently had a flair  in her bronchitis and was treated with antibiotics by her primary care  physician.  Patient reports her symptoms have totally resolved.  She  denies any purulent sputum, fever, chest pain, orthopnea and PND.  Patient was supposed to have received an x-ray at her last visit;  however, reports that she was unable to get this and requests to get  this followed up today.   PAST MEDICAL HISTORY:  Reviewed.   CURRENT MEDICATIONS:  Reviewed.   PHYSICAL EXAMINATION:  GENERAL:  Patient is in no acute distress.  VITAL SIGNS:  She is afebrile with stable vital signs.  O2 saturation  was 98% on room air.  HEENT:  Unremarkable.  NECK:  Supple without cervical adenopathy.  No JVD.  LUNGS:  Sounds reveal diminished breath sounds at the bases, otherwise  clear.  CARDIAC:  Regular rate.  ABDOMEN:  Soft and nontender.  EXTREMITIES:  Warm without any edema.   IMPRESSION/PLAN:  Chronic obstructive pulmonary disease.  Patient has  continued on her present regimen.  Recommended that she encouraged on  smoking cessation.  She will follow back up with Dr. Sherene Sires in 3 months or  sooner if needed.  Chest x-ray is pending at time of dictation.      Rubye Oaks, NP  Electronically Signed      Charlaine Dalton. Sherene Sires, MD, [facility]  Electronically Signed   TP/MedQ  DD: 09/28/2006  DT: 09/28/2006  Job #: [id number]

## 2010-11-19 NOTE — Assessment & Plan Note (Signed)
[facility]                             PULMONARY OFFICE NOTE   NAME:[name] [name], [name]                     MRN:          [id number]  DATE:06/28/2006                            DOB:          10-15-1950    HISTORY OF PRESENT ILLNESS:  The patient is a 60 year old African  American female patient of Dr. Thurston Hole with a known  history of COPD and  asthmatic bronchitis who continues to smoke.  The patient for an acute  office visit complaining of a one-week history of nasal congestion,  productive cough of thick yellow sputum.  The patient denies any  hemoptysis, chest pain, orthopnea, PND, leg swelling.  The patient was  seen six weeks ago after similar symptoms and prescribed Omnicef and a  prednisone taper.  The patient reports symptoms did improve.  The  patient also was supposed to follow up with Dr. Sherene Sires; however, missed  her followup appointment due to illness in the family.   PAST MEDICAL HISTORY:  Reviewed.   CURRENT MEDICATIONS:  Reviewed.   PHYSICAL EXAMINATION:  GENERAL:  The patient is a pleasant female in no  acute distress.  VITAL SIGNS: She is afebrile with stable vital signs.  Saturation is 99%  on room air.  HEENT:  Nasal mucosa shows some mild redness.  Nontender sinuses.  Posterior pharynx is clear.  NECK:  Supple.  No adenopathy.  LUNGS:  Lung sounds reveal coarse breath sounds without any wheezing or  crackles.  CARDIAC:  Regular rate.  ABDOMEN:  Soft and nontender.  EXTREMITIES:  Warm without any edema.   IMPRESSION AND PLAN:  Acute tracheobronchitis. The patient encouraged on  smoking cessation.  The patient is to be on doxycycline x 7 days.  Endal  HD #8 ounces one to two teaspoons every four to six hours as needed for  cough.  The patient is to return back here in one month with Dr. Sherene Sires or  sooner p.r.n.      Rubye Oaks, NP  Electronically Signed      Charlaine Dalton. Sherene Sires, MD, [facility]  Electronically Signed   TP/MedQ  DD: 06/28/2006  DT: 06/28/2006  Job #: [id number]

## 2010-11-19 NOTE — Assessment & Plan Note (Signed)
[facility]                               PULMONARY OFFICE NOTE   NAME:[name], [name] MICUCCI                     MRN:          [id number]  DATE:05/18/2006                            DOB:          02/25/1951    HISTORY OF PRESENT ILLNESS:  Patient is a 60 year old African-American  female patient of Dr. Thurston Hole who has a known history of COPD and asthmatic  bronchitis, presents to the office today complaining of a one-week history  of nasal congestion, productive cough with thick green sputum and wheezing.  Patient has not been seen in the office in greater than 2 years, has  recently been being followed by [facility].  Patient  presents today complaining that over the last week she has had significant  congestion and wheezing.  She is currently maintained on albuterol nebulizer  4 times a day and Budesonide 0.5 twice daily.  Patient denies any  hemoptysis, chest pain, orthopnea, PND, leg swelling, recent travel,  antibiotic use or weight loss.   PAST MEDICAL HISTORY:  Reviewed.   CURRENT MEDICATIONS:  Reviewed.   PHYSICAL EXAMINATION:  GENERAL:  Patient is a pleasant female in no acute  distress.  She is afebrile, stable vital signs.  O2 saturation is 97% on  room air.  HEENT:  Unremarkable.  NECK:  Supple without adenopathy, no JVD.  LUNGS:  Sounds reveal course breath sounds bilaterally with a few extra  wheezes.  CARDIAC:  Irregular rate and rhythm.  ABDOMEN:  Soft and benign.  EXTREMITIES:  Warm without any edema.   IMPRESSION/PLAN:  Acute exacerbation of asthmatic bronchitis.  Patient to  get Omnicef x7 days.  Mucinex DM twice daily, prednisone taper the next  week.  Patient is to monitor her blood sugars very carefully.  Patient does  have underlying diabetes mellitus.  Patient may use Endal HD, #8 ounces, 1-2  teaspoons every 4-6 hours as needed for cough.  Patient aware of sedating  effect.  Patient is to return here in 2  weeks with Dr. Sherene Sires or sooner if  needed.  Also patient is encouraged on smoking cessation.      Rubye Oaks, NP  Electronically Signed      Charlaine Dalton. Sherene Sires, MD, [facility]  Electronically Signed   TP/MedQ  DD: 05/18/2006  DT: 05/18/2006  Job #: [id number]

## 2010-12-22 ENCOUNTER — Telehealth: Payer: Self-pay | Admitting: Family Medicine

## 2010-12-22 NOTE — Telephone Encounter (Signed)
LVM for patient to call back. ?

## 2010-12-22 NOTE — Telephone Encounter (Signed)
Pt lost her DM meter and needs another one (she has been using her dad's) Accucheck and also strips [facility] [address]

## 2010-12-22 NOTE — Telephone Encounter (Signed)
I will not provide a prescription.  I have already provided the patient with two prescriptions in the last nine months for this item.  She is not on a medication that causes low blood sugar so it is not medically indicated.  If she wants one she can purchase it from the drug store.  I believe the cost about $20.

## 2010-12-23 NOTE — Telephone Encounter (Signed)
Spoke with patient's father and informed of below. He states that he has been checking patient's sugar and she lost the meter in her moving process

## 2011-01-06 ENCOUNTER — Encounter: Payer: Self-pay | Admitting: Family Medicine

## 2011-01-06 ENCOUNTER — Ambulatory Visit (INDEPENDENT_AMBULATORY_CARE_PROVIDER_SITE_OTHER): Payer: Medicaid Other | Admitting: Family Medicine

## 2011-01-06 VITALS — BP 112/68 | HR 73 | Temp 98.3°F | Ht 64.0 in | Wt 154.0 lb

## 2011-01-06 DIAGNOSIS — J449 Chronic obstructive pulmonary disease, unspecified: Secondary | ICD-10-CM

## 2011-01-06 DIAGNOSIS — N760 Acute vaginitis: Secondary | ICD-10-CM

## 2011-01-06 DIAGNOSIS — A64 Unspecified sexually transmitted disease: Secondary | ICD-10-CM

## 2011-01-06 LAB — POCT WET PREP (WET MOUNT)
Trichomonas Wet Prep HPF POC: NEGATIVE
Yeast Wet Prep HPF POC: NEGATIVE

## 2011-01-06 MED ORDER — TIOTROPIUM BROMIDE MONOHYDRATE 18 MCG IN CAPS: 18.0000 ug | ORAL_CAPSULE | Freq: Every day | RESPIRATORY_TRACT | Status: DC

## 2011-01-06 MED ORDER — LEVOTHYROXINE SODIUM 75 MCG PO TABS: 75.0000 ug | ORAL_TABLET | Freq: Every day | ORAL | Status: DC

## 2011-01-06 MED ORDER — ALBUTEROL SULFATE HFA 108 (90 BASE) MCG/ACT IN AERS: 2.0000 | INHALATION_SPRAY | RESPIRATORY_TRACT | Status: DC | PRN

## 2011-01-06 MED ORDER — AMLODIPINE BESYLATE 5 MG PO TABS: 5.0000 mg | ORAL_TABLET | Freq: Every day | ORAL | Status: DC

## 2011-01-06 NOTE — Patient Instructions (Signed)
It was good to see you today. I will let you know the results of your tests today.  If we need to treat anything, I will call you, otherwise you will get a letter, so make sure the address we have is up-to-date. I have refilled your synthroid, norvasc, albuterol, and spiriva today.

## 2011-01-07 LAB — GC/CHLAMYDIA PROBE AMP, GENITAL
Chlamydia, DNA Probe: NEGATIVE
GC Probe Amp, Genital: NEGATIVE

## 2011-01-07 LAB — HIV ANTIBODY (ROUTINE TESTING W REFLEX): HIV: NONREACTIVE

## 2011-01-08 ENCOUNTER — Telehealth: Payer: Self-pay | Admitting: Family Medicine

## 2011-01-08 MED ORDER — METRONIDAZOLE 500 MG PO TABS: 500.0000 mg | ORAL_TABLET | Freq: Two times a day (BID) | ORAL | Status: AC

## 2011-01-08 NOTE — Telephone Encounter (Signed)
Attempted to call patient to inform her of her bacterial vaginosis.  Unable to get answer on either mobile or home phone.  Will send in prescription anyway.  If patient calls either the clinic or the emergency line it is OK to tell her that she has BV and that the prescription was sent in for this.

## 2011-01-14 DIAGNOSIS — N898 Other specified noninflammatory disorders of vagina: Secondary | ICD-10-CM | POA: Insufficient documentation

## 2011-01-14 NOTE — Assessment & Plan Note (Signed)
the patient has known COPD. She does not have any current findings on lung exam that are suggestive of an acute exacerbation. The patient does admit to some productive cough, however this is long-standing. She does not take her inhalers on a routine basis. I do not feel that the patient requires antibiotics or steroids at this point in time as she is afebrile with no oxygen requirement. I will provide the patient with refills on her inhalers and encourage their usage. Prior to leaving the patient informed me that she would simply go to her cardiologist to get her steroids that she wants.

## 2011-01-14 NOTE — Assessment & Plan Note (Signed)
Obtained a wet prep, GC Chlamydia, and HIV today. The HIV is due to the patient's previous concerns about exposure. Wet prep does show possible bacterial vaginosis. Will plan to treat this as the patient is symptomatic. Unfortunately the patient left before the results of the wet prep for available. She also has changed her phone number and did not leave [place] with a phone number. I will call in a prescription for Flagyl for her and will hope that she either calls back as she promised to work at the pharmacy has updated contact information for her.

## 2011-01-14 NOTE — Progress Notes (Signed)
Subjective: the patient presents today complaining of vaginal discharge. She reports that she is in the process of changing apartments, and has recently changed partners. Since that time she has noticed an increase in vaginal discharge and complains of some problems with over. She does not have any bleeding or pain. She also reports continued problems with cough and wheezing. She is requesting both antibiotics and steroids because she feels that it is time for her to have these again. She warns that if she does not have these she will end up in the hospital in the ICU.  Objective: vital signs reviewed. The patient is afebrile with adequate oxygen saturation on room air. General: no acute distress. Cardiovascular: regular rate and rhythm with 2+ pulses and all extremities.  Respiratory: course breath sounds bilaterally with good air movement. No focal findings on exam. Pelvic: scant vaginal discharge with mild odor. There is no significant tenderness on exam. Abdomen is soft without adnexal tenderness. External genitalia are normal.

## 2011-02-03 ENCOUNTER — Other Ambulatory Visit: Payer: Self-pay | Admitting: Cardiovascular Disease

## 2011-02-03 ENCOUNTER — Telehealth: Payer: Self-pay | Admitting: Family Medicine

## 2011-02-03 ENCOUNTER — Ambulatory Visit
Admission: RE | Admit: 2011-02-03 | Discharge: 2011-02-03 | Disposition: A | Discharge status: Home / Self Care | Payer: Medicaid Other | Source: Ambulatory Visit | Attending: Cardiovascular Disease | Admitting: Cardiovascular Disease

## 2011-02-03 DIAGNOSIS — R05 Cough: Secondary | ICD-10-CM

## 2011-02-03 DIAGNOSIS — R059 Cough, unspecified: Secondary | ICD-10-CM

## 2011-02-03 MED ORDER — ROSUVASTATIN CALCIUM 40 MG PO TABS: 40.0000 mg | ORAL_TABLET | Freq: Every day | ORAL | Status: DC

## 2011-02-03 NOTE — Telephone Encounter (Signed)
I have sent in the refill.  I have not received any refill requests for this medication.

## 2011-02-03 NOTE — Telephone Encounter (Signed)
[facility] on [address]  has been requesting a refill on Crestor for 1 1/2 weeks has gotten no response.  She has been off of the Crestor for over a week.  2 were sent yesterday and said that they have been sending them everyday.

## 2011-02-04 ENCOUNTER — Telehealth: Payer: Self-pay | Admitting: Family Medicine

## 2011-02-04 NOTE — Telephone Encounter (Signed)
[name] Long has called because she is still having back pain, an appt has been scheduled.  She also needs the Albuterol solution for her nebulizer sent to the [facility] on [address].  I told her to have the pharmacy fax a request, but she said they had already done that and the pharmacy told her to call since it took so long for her to get her Crestor.

## 2011-02-04 NOTE — Telephone Encounter (Signed)
Interesting.  Is [facility] one of our problem pharmacies?

## 2011-02-07 ENCOUNTER — Ambulatory Visit: Payer: Medicaid Other

## 2011-02-07 NOTE — Telephone Encounter (Signed)
LVM for patient to call back. ?

## 2011-02-07 NOTE — Telephone Encounter (Signed)
As far as I can tell, we have never prescribed an albuterol nebulizer for her.  As far back as I look, we have just been using an albuterol MDI (which I gave her a prescription for less than two months ago).  If she feels she needs a nebulizer, I feel this is something that requires a visit to discuss.  We will also have to discuss what to do with her back pain in a visit.  I spoke with Dr. McDiarmid this morning and he agrees with this assessment regarding the albuterol.

## 2011-02-08 ENCOUNTER — Other Ambulatory Visit: Payer: Self-pay | Admitting: Family Medicine

## 2011-02-08 NOTE — Telephone Encounter (Signed)
Refill request

## 2011-02-09 ENCOUNTER — Ambulatory Visit: Payer: Medicaid Other

## 2011-02-09 ENCOUNTER — Ambulatory Visit: Payer: Medicaid Other | Admitting: Family Medicine

## 2011-02-14 ENCOUNTER — Telehealth: Payer: Self-pay | Admitting: Family Medicine

## 2011-02-14 ENCOUNTER — Ambulatory Visit (INDEPENDENT_AMBULATORY_CARE_PROVIDER_SITE_OTHER): Payer: Medicaid Other | Admitting: Family Medicine

## 2011-02-14 ENCOUNTER — Encounter: Payer: Self-pay | Admitting: Family Medicine

## 2011-02-14 VITALS — BP 130/80 | Temp 98.0°F | Wt 156.9 lb

## 2011-02-14 DIAGNOSIS — R300 Dysuria: Secondary | ICD-10-CM | POA: Insufficient documentation

## 2011-02-14 LAB — POCT URINALYSIS DIPSTICK
Bilirubin, UA: NEGATIVE
Blood, UA: NEGATIVE
Glucose, UA: NEGATIVE
Ketones, UA: NEGATIVE
Leukocytes, UA: NEGATIVE
Nitrite, UA: NEGATIVE
Protein, UA: NEGATIVE
Spec Grav, UA: 1.015
Urobilinogen, UA: 0.2
pH, UA: 6

## 2011-02-14 MED ORDER — CEPHALEXIN 500 MG PO CAPS: 500.0000 mg | ORAL_CAPSULE | Freq: Four times a day (QID) | ORAL | Status: AC

## 2011-02-14 MED ORDER — FLUCONAZOLE 150 MG PO TABS: 150.0000 mg | ORAL_TABLET | Freq: Once | ORAL | Status: AC

## 2011-02-14 NOTE — Progress Notes (Signed)
  Subjective:    Patient ID: [name] Long, female    DOB: 02/22/1951, 60 y.o.   MRN: [id number]  Urinary Tract Infection  This is a recurrent problem. The current episode started 1 to 4 weeks ago (acute worsening over last 3 days ). The problem occurs intermittently. The quality of the pain is described as burning (generalized irritation. ). The pain is at a severity of 6/10. The pain is mild. Maximum temperature: + chills  She is sexually active. There is no history of pyelonephritis. Associated symptoms include flank pain, frequency and urgency. Pertinent negatives include no discharge, hematuria or vomiting. She has tried nothing for the symptoms. possible dx of vulvovaginitis       Review of Systems  Gastrointestinal: Negative for vomiting.  Genitourinary: Positive for urgency, frequency and flank pain. Negative for hematuria.       Objective:   Physical Exam Gen: up in bed, NAD CV:RRR, no murmurs  PULM: CTAB, no wheezes,  ABD: s/+ bowel sounds/mild suprapubic tenderness, mild L CVA tenderness EXT: 2+peripheral pulses,     Assessment & Plan:

## 2011-02-14 NOTE — Patient Instructions (Signed)
It was good to see you today. I am treating you a urinary tract infection and yeast infection  If your symptoms do not improve over the 2-4 weeks, come back to see [place] Follow up with Dr. Louanne Belton about your other issues, Otherwise call with any other questions,  God Bless,  Doree Albee MD

## 2011-02-14 NOTE — Assessment & Plan Note (Addendum)
Will cover for both UTI and candidal vulvovaginitis. UA relatively benign. Urine cx pending. Discussed red flags for return. Pt agreeable to plan.

## 2011-02-14 NOTE — Telephone Encounter (Signed)
Patient is saying that she wants all her prescriptions to be refilled for the first of the month at [facility] on [address].  She also wanted to let you know she is still having bronchial issues.  She has an appt with you on the 27th.

## 2011-02-16 LAB — URINE CULTURE

## 2011-02-18 NOTE — Telephone Encounter (Signed)
Pt called back and message was given. 

## 2011-02-18 NOTE — Telephone Encounter (Signed)
If the patient wants refills, she should contact her pharmacy and ask them to send refill requests electronically.  This is the clinic policy.  We can talk about her lung problems at her upcoming appointment.

## 2011-02-18 NOTE — Telephone Encounter (Signed)
LM with daughter for patient to call back to inform of below

## 2011-02-28 ENCOUNTER — Ambulatory Visit: Payer: Medicaid Other | Admitting: Family Medicine

## 2011-03-01 ENCOUNTER — Telehealth: Payer: Self-pay | Admitting: Family Medicine

## 2011-03-01 ENCOUNTER — Encounter: Payer: Self-pay | Admitting: Family Medicine

## 2011-03-01 DIAGNOSIS — M1710 Unilateral primary osteoarthritis, unspecified knee: Secondary | ICD-10-CM

## 2011-03-01 DIAGNOSIS — IMO0002 Reserved for concepts with insufficient information to code with codable children: Secondary | ICD-10-CM

## 2011-03-01 DIAGNOSIS — M479 Spondylosis, unspecified: Secondary | ICD-10-CM

## 2011-03-01 NOTE — Telephone Encounter (Signed)
Referral was put in and Dr. Sable Feil off was given NPI number and authorized # of visits which is one.

## 2011-03-01 NOTE — Telephone Encounter (Signed)
Requesting a referral to Dr. Hampton Abbot office @ [facility].  Pt is requesting an appt today with them it's possible.  Please call her back to let her know when the appt will be.

## 2011-03-01 NOTE — Telephone Encounter (Signed)
Ms. Gaal will be going at to Dr. Dierdre Harness office today at 4:40 for a Cortisone shot.  She will need the referral by then.  Please call her when this is ready.  She said she has been told she need a knee replacement, but she isn't mentally ready to do that.  She can't walk or get around unless she gets this Cortisone shot.

## 2011-03-16 ENCOUNTER — Ambulatory Visit: Payer: Medicaid Other | Admitting: Family Medicine

## 2011-03-17 ENCOUNTER — Other Ambulatory Visit: Payer: Self-pay | Admitting: Family Medicine

## 2011-03-17 NOTE — Telephone Encounter (Signed)
Refill request

## 2011-04-05 ENCOUNTER — Ambulatory Visit: Payer: Medicaid Other | Admitting: Family Medicine

## 2011-04-08 ENCOUNTER — Ambulatory Visit: Payer: Medicaid Other | Admitting: Family Medicine

## 2011-04-14 ENCOUNTER — Inpatient Hospital Stay (INDEPENDENT_AMBULATORY_CARE_PROVIDER_SITE_OTHER)
Admission: RE | Admit: 2011-04-14 | Discharge: 2011-04-14 | Disposition: A | Discharge status: Home / Self Care | Payer: Self-pay | Source: Ambulatory Visit | Attending: Emergency Medicine | Admitting: Emergency Medicine

## 2011-04-14 DIAGNOSIS — N760 Acute vaginitis: Secondary | ICD-10-CM

## 2011-04-14 LAB — POCT URINALYSIS DIP (DEVICE)
Bilirubin Urine: NEGATIVE
Glucose, UA: NEGATIVE mg/dL
Hgb urine dipstick: NEGATIVE
Ketones, ur: NEGATIVE mg/dL
Leukocytes, UA: NEGATIVE
Nitrite: NEGATIVE
Protein, ur: NEGATIVE mg/dL
Specific Gravity, Urine: 1.02 (ref 1.005–1.030)
Urobilinogen, UA: 0.2 mg/dL (ref 0.0–1.0)
pH: 6 (ref 5.0–8.0)

## 2011-04-14 LAB — WET PREP, GENITAL
Clue Cells Wet Prep HPF POC: NONE SEEN
Trich, Wet Prep: NONE SEEN
WBC, Wet Prep HPF POC: NONE SEEN
Yeast Wet Prep HPF POC: NONE SEEN

## 2011-04-15 LAB — GC/CHLAMYDIA PROBE AMP, GENITAL
Chlamydia, DNA Probe: NEGATIVE
GC Probe Amp, Genital: NEGATIVE

## 2011-04-18 ENCOUNTER — Encounter: Payer: Self-pay | Admitting: Family Medicine

## 2011-05-12 ENCOUNTER — Other Ambulatory Visit: Payer: Self-pay | Admitting: Family Medicine

## 2011-05-12 DIAGNOSIS — Z1231 Encounter for screening mammogram for malignant neoplasm of breast: Secondary | ICD-10-CM

## 2011-05-14 ENCOUNTER — Other Ambulatory Visit: Payer: Self-pay | Admitting: Family Medicine

## 2011-05-16 NOTE — Telephone Encounter (Signed)
Refill request

## 2011-05-17 ENCOUNTER — Encounter: Payer: Self-pay | Admitting: Sports Medicine

## 2011-05-17 ENCOUNTER — Ambulatory Visit (INDEPENDENT_AMBULATORY_CARE_PROVIDER_SITE_OTHER): Payer: Medicaid Other | Admitting: Sports Medicine

## 2011-05-17 VITALS — BP 109/81 | HR 92 | Temp 99.1°F | Ht 64.0 in | Wt 161.2 lb

## 2011-05-17 DIAGNOSIS — Z23 Encounter for immunization: Secondary | ICD-10-CM

## 2011-05-17 DIAGNOSIS — R300 Dysuria: Secondary | ICD-10-CM

## 2011-05-17 DIAGNOSIS — J449 Chronic obstructive pulmonary disease, unspecified: Secondary | ICD-10-CM

## 2011-05-17 LAB — POCT URINALYSIS DIPSTICK
Bilirubin, UA: NEGATIVE
Blood, UA: NEGATIVE
Glucose, UA: NEGATIVE
Ketones, UA: NEGATIVE
Leukocytes, UA: NEGATIVE
Nitrite, UA: NEGATIVE
Protein, UA: NEGATIVE
Spec Grav, UA: 1.01
Urobilinogen, UA: 0.2
pH, UA: 6

## 2011-05-17 MED ORDER — FLUTICASONE-SALMETEROL 100-50 MCG/DOSE IN AEPB: 1.0000 | INHALATION_SPRAY | Freq: Two times a day (BID) | RESPIRATORY_TRACT | Status: DC

## 2011-05-17 MED ORDER — PNEUMOCOCCAL VAC POLYVALENT 25 MCG/0.5ML IJ INJ: 0.5000 mL | INJECTION | Freq: Once | INTRAMUSCULAR | Status: DC

## 2011-05-17 MED ORDER — DOXYCYCLINE HYCLATE 100 MG PO TABS: 100.0000 mg | ORAL_TABLET | Freq: Two times a day (BID) | ORAL | Status: AC

## 2011-05-17 NOTE — Patient Instructions (Signed)
It was nice meeting you today.  I'm sending a prescription for doxycycline and for Advair.  It is very important to use the Advair on a daily basis this will help with the inflammation that is going on your lungs. If you continue to have issues after the next 2 weeks we'll consider a referral to pulmonology at that time. It is very important for your overall health and specifically for your lung help that you quit smoking.  Please followup with Dr. Luan Pulling in 4 weeks. Ask the front desk what day he is working so may make a day up appointment with him.

## 2011-05-17 NOTE — Assessment & Plan Note (Signed)
Checked urinalysis today due to her frequency and dysuria. Urinalysis was negative for urinary tract infection. I feel like her so she left flank pain is due mainly to rib pain secondary to her coughing.

## 2011-05-17 NOTE — Assessment & Plan Note (Signed)
Due to the patient's 2 months of persistent cough and wheezing on exam we will prescribe her doxycycline for an acute exacerbation of COPD primarily chronic bronchitis in nature. We're also changing her controller medications as she is no longer taking Spiriva due to the jitteriness it caused her.  We will start her on Advair Diskus as a controller. If she is not improved within the course of the doxycycline we will consider referral to [facility] pulmonology as she has seen them in the past.

## 2011-06-07 ENCOUNTER — Ambulatory Visit
Admission: RE | Admit: 2011-06-07 | Discharge: 2011-06-07 | Disposition: A | Discharge status: Home / Self Care | Payer: Medicaid Other | Source: Ambulatory Visit | Attending: Internal Medicine | Admitting: Internal Medicine

## 2011-06-07 DIAGNOSIS — Z1231 Encounter for screening mammogram for malignant neoplasm of breast: Secondary | ICD-10-CM

## 2011-06-08 ENCOUNTER — Encounter: Payer: Self-pay | Admitting: Family Medicine

## 2011-06-08 ENCOUNTER — Ambulatory Visit (INDEPENDENT_AMBULATORY_CARE_PROVIDER_SITE_OTHER): Payer: Medicaid Other | Admitting: Family Medicine

## 2011-06-08 VITALS — BP 128/82 | HR 98 | Temp 98.3°F | Ht 64.0 in | Wt 158.0 lb

## 2011-06-08 DIAGNOSIS — Z23 Encounter for immunization: Secondary | ICD-10-CM

## 2011-06-08 DIAGNOSIS — M159 Polyosteoarthritis, unspecified: Secondary | ICD-10-CM | POA: Insufficient documentation

## 2011-06-08 MED ORDER — PNEUMOCOCCAL VAC POLYVALENT 25 MCG/0.5ML IJ INJ: 0.5000 mL | INJECTION | Freq: Once | INTRAMUSCULAR | Status: DC

## 2011-06-09 ENCOUNTER — Telehealth: Payer: Self-pay | Admitting: Family Medicine

## 2011-06-09 ENCOUNTER — Telehealth: Payer: Self-pay | Admitting: *Deleted

## 2011-06-09 NOTE — Telephone Encounter (Signed)
Called [facility] and spoke with [place]. Gave her NPI # for pt to be seen and she will call pt to make appt.Loralee Pacas [place]

## 2011-06-09 NOTE — Telephone Encounter (Signed)
Called [facility] and spoke with Sabrina. Gave her NPI # for pt to be seen and she will call pt to make appt.Barkley, Tonya [place]etta  

## 2011-06-09 NOTE — Progress Notes (Signed)
Patient ID: [name] Long, female   DOB: 1950/10/24, 60 y.o.   MRN: [id number] Subjective:  [name] Long is a 60 y.o. female presenting today for worsening dyspnea, left lower flank pain and medication refills.  She reports having an increased cough over the past couple of months that has progressively worsened over the past couple of days. Reports a green productive thickening to her mucus and 2 days of fevers chills. She does report that she has been seen by our pulmonology in the past a treated her symptoms in the past with oral steroids and seemed to help greatly. She is not taking any long-term controller medication at this time but does report using her albuterol at least 4 times per week. She does continue to smoke half a pack per day.   ROS: Respiratory as above.  She does feel like she is acutely ill. Positive dysuria, frequency. No hematuria. Mild left flank pain. She does report a fall though one month previously to the same area.  PE: GENERAL:  Adult female examined in [facility]. Alert and properly interactive, in moderate discomfort, no respiratory distress. HNEENT: Atraumatic normocephalic, moist the membranes, no scleral icterus THORAX: HEART: S1, S2 normal, no murmur, rub or gallop, regular rate and rhythm LUNGS: unlabored breathing and mild expiratory wheezes and crackles throughout, no consolidation. ABDOMEN:  abdomen is soft without significant tenderness, masses, organomegaly or guarding EXTREMITIES: Warm well-perfused, no peripheral edema, moves all 4 extremity spontaneously.

## 2011-06-09 NOTE — Telephone Encounter (Signed)
Needs to know when her appt is for ortho referral

## 2011-06-10 NOTE — Progress Notes (Signed)
  Subjective:    Patient ID: [name] Long, female    DOB: July 19, 1950, 60 y.o.   MRN: [id number]  HPI  Patient is here for a same day appointment.  She has significant hip, back and knee pain that has been managed by Dr. Erasmo Leventhal at [facility].  She desires to continue to be cared for by ortho (she is SP surg on both knees) and needs a referral. The expressed purpose of this visit is for that referral.    Review of Systems     Objective:   Physical Exam  VS noted Heart RRR Lungs clear.      Assessment & Plan:

## 2011-06-10 NOTE — Assessment & Plan Note (Signed)
Approved referral because she has been getting care there.

## 2011-06-17 ENCOUNTER — Encounter: Payer: Self-pay | Admitting: Family Medicine

## 2011-06-17 ENCOUNTER — Telehealth: Payer: Self-pay | Admitting: *Deleted

## 2011-06-17 ENCOUNTER — Ambulatory Visit (INDEPENDENT_AMBULATORY_CARE_PROVIDER_SITE_OTHER): Payer: Medicaid Other | Admitting: Family Medicine

## 2011-06-17 DIAGNOSIS — E7800 Pure hypercholesterolemia, unspecified: Secondary | ICD-10-CM

## 2011-06-17 DIAGNOSIS — J449 Chronic obstructive pulmonary disease, unspecified: Secondary | ICD-10-CM

## 2011-06-17 DIAGNOSIS — J4489 Other specified chronic obstructive pulmonary disease: Secondary | ICD-10-CM

## 2011-06-17 DIAGNOSIS — M940 Chondrocostal junction syndrome [Tietze]: Secondary | ICD-10-CM

## 2011-06-17 MED ORDER — PRAVASTATIN SODIUM 40 MG PO TABS: 40.0000 mg | ORAL_TABLET | Freq: Every evening | ORAL | Status: DC

## 2011-06-17 MED ORDER — ATORVASTATIN CALCIUM 40 MG PO TABS: 40.0000 mg | ORAL_TABLET | Freq: Every day | ORAL | Status: DC

## 2011-06-17 MED ORDER — BECLOMETHASONE DIPROPIONATE 80 MCG/ACT IN AERS: 1.0000 | INHALATION_SPRAY | RESPIRATORY_TRACT | Status: DC | PRN

## 2011-06-17 MED ORDER — PREDNISONE 50 MG PO TABS: ORAL_TABLET | ORAL | Status: AC

## 2011-06-17 NOTE — Assessment & Plan Note (Signed)
Trial on pravastatin. If fails will qualify for crestor again.

## 2011-06-17 NOTE — Patient Instructions (Signed)
Was good to see you today. I have sent in your prescription for prednisone for 5 days to help with your ribs. I looked into your Crestor.  It looks like Medicaid insists that we try both Lipitor and Pravachol before they will give Crestor.  I will send in Lipitor for you because I don't have any notes in our record that say you can't take something else. I want you to try some QVar, an inhaled steroid, to help with your breathing and cough.

## 2011-06-17 NOTE — Assessment & Plan Note (Signed)
Not taking advair due to "side effects."  Will try QVAR to get some form of steroid on board.

## 2011-06-17 NOTE — Progress Notes (Signed)
Subjective: Pt reports having a lot of coughing and has ended up with left rib pain. She reports that she had "the flu" about 2 months ago, and spent 2 months coughing. This is the reason for her rib pain. Her coughing has become somewhat better, however she still has some problems with rib pain. She has had this problem from time to time, and has, on occasion, been given a short course of steroids that seemed to have helped it.  The patient reports that she is being denied her Crestor do to it not being on the Medicaid preferred drug list. She remembers having had an intolerance to Lipitor, but cannot remember having been tried on any other medications for her cholesterol.  The patient also reports that the Advair is causing her to feel dizzy. Accordingly she has not been taking it.  Objective:  Filed Vitals:   06/17/11 0930  BP: 131/89  Pulse: 86  Temp: 98.4 F (36.9 C)   Gen: No acute distress, obese CV: Regular rate and rhythm Chest: Pain along the lower ribs, especially at costochondral junction bilaterally Resp: Scattered wheezes Extremities: 2+ pulses, no edema  Assessment/Plan: Short course of steroids for costochondritis.  Please also see individual problems in problem list for problem-specific plans.

## 2011-06-17 NOTE — Telephone Encounter (Signed)
Called and asked that Rx for lipitor be cancelled.Loralee Pacas [place]

## 2011-07-06 ENCOUNTER — Other Ambulatory Visit: Payer: Self-pay | Admitting: Family Medicine

## 2011-07-06 ENCOUNTER — Telehealth: Payer: Self-pay | Admitting: Family Medicine

## 2011-07-06 DIAGNOSIS — I10 Essential (primary) hypertension: Secondary | ICD-10-CM

## 2011-07-06 NOTE — Telephone Encounter (Signed)
This is just an [place], Ms. Lazard called to make an appointment with Dr. Louanne Belton.  She started off by saying that he told her she was no longer on probation.  I did not see any notes pertaining to that, I also spoke to [place] and [employer] and neither of them knew anything about this either.  I let her know this and offered to schedule an appt for tomorrow with one of the MD's but she declined.

## 2011-07-07 ENCOUNTER — Other Ambulatory Visit: Payer: Self-pay | Admitting: Family Medicine

## 2011-07-08 ENCOUNTER — Other Ambulatory Visit: Payer: Self-pay | Admitting: Family Medicine

## 2011-07-08 MED ORDER — METFORMIN HCL 500 MG PO TABS: 500.0000 mg | ORAL_TABLET | Freq: Every day | ORAL | Status: DC

## 2011-07-08 MED ORDER — HYDROCHLOROTHIAZIDE 25 MG PO TABS: 25.0000 mg | ORAL_TABLET | Freq: Every day | ORAL | Status: DC

## 2011-07-08 MED ORDER — OMEPRAZOLE 40 MG PO CPDR: 40.0000 mg | DELAYED_RELEASE_CAPSULE | Freq: Every day | ORAL | Status: DC

## 2011-07-08 NOTE — Telephone Encounter (Signed)
Refill request

## 2011-07-11 NOTE — Telephone Encounter (Signed)
[name] Long, Can I close this encounter? Desma Mcgregor

## 2011-07-18 ENCOUNTER — Telehealth: Payer: Self-pay | Admitting: *Deleted

## 2011-07-18 NOTE — Telephone Encounter (Signed)
Received call from [facility].  Patient has an appt with Dr. Randon Goldsmith for diabetic eye exam and they need NPI #.  Info given.  Gaylene Brooks, RN

## 2011-07-28 ENCOUNTER — Ambulatory Visit: Payer: Medicaid Other | Admitting: Family Medicine

## 2011-07-28 ENCOUNTER — Telehealth: Payer: Self-pay | Admitting: *Deleted

## 2011-07-28 ENCOUNTER — Encounter: Payer: Self-pay | Admitting: Family Medicine

## 2011-07-28 ENCOUNTER — Ambulatory Visit (INDEPENDENT_AMBULATORY_CARE_PROVIDER_SITE_OTHER): Payer: Medicaid Other | Admitting: Family Medicine

## 2011-07-28 DIAGNOSIS — R0781 Pleurodynia: Secondary | ICD-10-CM | POA: Insufficient documentation

## 2011-07-28 DIAGNOSIS — R079 Chest pain, unspecified: Secondary | ICD-10-CM

## 2011-07-28 DIAGNOSIS — N760 Acute vaginitis: Secondary | ICD-10-CM

## 2011-07-28 DIAGNOSIS — Z202 Contact with and (suspected) exposure to infections with a predominantly sexual mode of transmission: Secondary | ICD-10-CM

## 2011-07-28 DIAGNOSIS — A599 Trichomoniasis, unspecified: Secondary | ICD-10-CM | POA: Insufficient documentation

## 2011-07-28 LAB — POCT WET PREP (WET MOUNT)
WBC, Wet Prep HPF POC: >20
Yeast Wet Prep HPF POC: NEGATIVE

## 2011-07-28 MED ORDER — METRONIDAZOLE 500 MG PO TABS: ORAL_TABLET | ORAL | Status: DC

## 2011-07-28 NOTE — Telephone Encounter (Signed)
Spoke with [name] regarding an appt that was scheduled for next month.  Informed her that her probation status was still in effect until we heard from Director or provider to release it.  Pt said that she's already had her 3rd visit and therefore she could go back to being [name] Long patient.  Informed her that she was not the one who made that determination, and that again until we hear from the above we cannot change her status.  Pt said [name] Long told her that he would take her back and I informed her that when I heard this directly from him then we can make the change.  Cancelled appt until further notice from pcp.

## 2011-07-28 NOTE — Telephone Encounter (Signed)
LVM for patient to call back. ?

## 2011-07-28 NOTE — Telephone Encounter (Signed)
Message copied by Farrell Ours on Thu Jul 28, 2011  1:52 PM ------      Message from: Macy Mis      Created: Thu Jul 28, 2011  1:34 PM       Please let patient know she has trichomonas, i have sent in 4 tablets of flagyl to be taken at once, advise her to abstain from sex until 1 week after being treated and to alert partners to get tested for STD's

## 2011-07-28 NOTE — Assessment & Plan Note (Signed)
Will treat for trichomonas.  GC/Chlamydia and HIV, RPR pending.

## 2011-07-28 NOTE — Telephone Encounter (Signed)
Pt informed of results and told that her Rx has been sent into the pharmacy for her to p/u. Told her to make sure that she takes all 4 tablets at once and to inform her partner that he will need to be tested and treated at once. She inquired about the other labs and I told her that those results were not in yet.Loralee Pacas [place]

## 2011-07-28 NOTE — Telephone Encounter (Signed)
1) It is my understanding that our clinic's policy is that once someone is a SDA patient that it is up to Dennison Nancy to determine when they should be moved back to regular status.  I do not have the authority to do so.  I am sorry if anything I said was interpreted by Ms. Hochstein in any other way.  2) When the patient is moved back to regular status, I do not know what the clinic policy is regarding who the patient is assigned to.  If clinic policy is that she comes back to me, that I will take her.  If clinic policy is that she is assigned to someone new, then that is what we do.  If clinic policy is that she goes into the "new patient" pool and is assigned to the next resident due for a new patient, then that is what we do.  I have not specifically requested that the patient be returned to me.

## 2011-07-28 NOTE — Assessment & Plan Note (Signed)
No evidence of acute change.  Like related to chronic back pain.  Patient declines xray stating it is too inconvenient and would rather see a rib specialist.  I am unable to determine who she is referring to. Advised ibuprofen, tylenol, returning to see her orthopedist.

## 2011-07-28 NOTE — Patient Instructions (Signed)
Will call you with results of yoru STD testing.  Let us know if you do not hear from [place] within 1-2 weeks

## 2011-07-28 NOTE — Progress Notes (Signed)
  Subjective:    Patient ID: [name] Long, female    DOB: 10-20-1950, 61 y.o.   MRN: [id number]  HPI Here for same day appointment to evaluate rib pain and vaginal discharge.  Is on same day appt only status as is on probation for [facility]. This is compliant visit #1 of 3 required.  Rib pain:  Leftt sided ribs.  Months in duration, no worsening.  States worse when she coughs.  Also has history of chronic back pain.   Wants to be referred to a "rib specialist."  Already has an orthopedist she sees for various other issues but states this is not the same type of doctor.  Pain overall mild.  Vaginal discharge: several weeks in duration.  Yellow, has an odor.  Notes 1 sexual partner, not monogamous relationship, no condom use.  Also requests HIV testing and pregnancy test.  Has not had a period since menopause at age 78.  Review of Systems    see above Objective:   Physical Exam   GEN: Alert & Oriented, No acute distress CV:  Regular Rate & Rhythm, no murmur Respiratory:  Normal work of breathing, CTAB Abd:  + BS, soft, no tenderness to palpation MSK:  tendernes to palpation on left rib cage and paraspinal muscles.  Not localized over bones. Pelvic Exam:        External: normal female genitalia without lesions or masses        Vagina: normal without lesions or masses        Cervix: normal without lesions or masses        Adnexa: normal bimanual exam without masses or fullness        Uterus: normal by palpation        Samples for Wet prep, GC/Chlamydia obtained      Assessment & Plan:

## 2011-07-29 LAB — GC/CHLAMYDIA PROBE AMP, GENITAL
Chlamydia, DNA Probe: NEGATIVE
GC Probe Amp, Genital: NEGATIVE

## 2011-07-29 LAB — RPR

## 2011-07-29 LAB — HIV ANTIBODY (ROUTINE TESTING W REFLEX): HIV: NONREACTIVE

## 2011-07-29 NOTE — Telephone Encounter (Signed)
She is to be re-established with Dr. Louanne Belton.  Need to explain to her the if she begins to no-show again she will go back to SDA status.

## 2011-08-01 ENCOUNTER — Encounter: Payer: Self-pay | Admitting: Family Medicine

## 2011-08-04 ENCOUNTER — Encounter: Payer: Self-pay | Admitting: Family Medicine

## 2011-08-08 NOTE — Telephone Encounter (Signed)
error 

## 2011-08-08 NOTE — Telephone Encounter (Signed)
This encounter was created in error - please disregard.

## 2011-08-09 ENCOUNTER — Ambulatory Visit: Payer: Medicaid Other | Admitting: Family Medicine

## 2011-08-10 ENCOUNTER — Other Ambulatory Visit: Payer: Self-pay | Admitting: Family Medicine

## 2011-08-10 NOTE — Telephone Encounter (Signed)
Refill request

## 2011-08-18 ENCOUNTER — Encounter: Payer: Self-pay | Admitting: Family Medicine

## 2011-08-18 ENCOUNTER — Ambulatory Visit (INDEPENDENT_AMBULATORY_CARE_PROVIDER_SITE_OTHER): Payer: Medicaid Other | Admitting: Family Medicine

## 2011-08-18 DIAGNOSIS — E119 Type 2 diabetes mellitus without complications: Secondary | ICD-10-CM

## 2011-08-18 DIAGNOSIS — R358 Other polyuria: Secondary | ICD-10-CM

## 2011-08-18 DIAGNOSIS — R0781 Pleurodynia: Secondary | ICD-10-CM

## 2011-08-18 DIAGNOSIS — R3589 Other polyuria: Secondary | ICD-10-CM

## 2011-08-18 DIAGNOSIS — E7800 Pure hypercholesterolemia, unspecified: Secondary | ICD-10-CM

## 2011-08-18 DIAGNOSIS — R079 Chest pain, unspecified: Secondary | ICD-10-CM

## 2011-08-18 LAB — POCT URINALYSIS DIPSTICK
Bilirubin, UA: NEGATIVE
Blood, UA: NEGATIVE
Glucose, UA: NEGATIVE
Leukocytes, UA: NEGATIVE
Nitrite, UA: NEGATIVE
Protein, UA: NEGATIVE
Spec Grav, UA: 1.015
Urobilinogen, UA: 0.2
pH, UA: 6

## 2011-08-18 LAB — POCT UA - MICROALBUMIN
Albumin/Creatinine Ratio, Urine, POC: <30
Creatinine, POC: 100 mg/dL
Microalbumin Ur, POC: 30 mg/dL

## 2011-08-18 LAB — POCT GLYCOSYLATED HEMOGLOBIN (HGB A1C): Hemoglobin A1C: 6.4

## 2011-08-18 LAB — LDL CHOLESTEROL, DIRECT: Direct LDL: 105 mg/dL — ABNORMAL HIGH

## 2011-08-18 MED ORDER — BENZONATATE 200 MG PO CAPS: 200.0000 mg | ORAL_CAPSULE | Freq: Three times a day (TID) | ORAL | Status: AC | PRN

## 2011-08-18 MED ORDER — MELOXICAM 15 MG PO TABS: 15.0000 mg | ORAL_TABLET | Freq: Every day | ORAL | Status: DC

## 2011-08-18 MED ORDER — LOSARTAN POTASSIUM 50 MG PO TABS: 50.0000 mg | ORAL_TABLET | Freq: Every day | ORAL | Status: DC

## 2011-08-18 NOTE — Patient Instructions (Addendum)
It was great to see you today! I want you to take Mobic every day for your rib pain.  I also want you also try some Tessalon for your cough. We are checking your cholesterol today.  I will let you know if we need ot change your medication at your next visit. Come back to see me in about 2 weeks.

## 2011-09-02 ENCOUNTER — Telehealth: Payer: Self-pay | Admitting: *Deleted

## 2011-09-02 NOTE — Telephone Encounter (Signed)
PA required for losartan. Form placed in MD box.

## 2011-09-05 NOTE — Telephone Encounter (Signed)
Dr. Louanne Belton states he received approval by phone. Pharmacy notified.

## 2011-09-08 ENCOUNTER — Other Ambulatory Visit: Payer: Self-pay | Admitting: Family Medicine

## 2011-09-08 NOTE — Telephone Encounter (Signed)
Refill request

## 2011-09-19 NOTE — Progress Notes (Signed)
Subjective: The patient is a 61 y.o. year old female who presents today for follow up.  Patient reports that she has been having significant problems with cough.  This has been going on for months.  She has been seen here at [facility] multiple times for this compliant.  Pt reports that the cough has started causing problems with rib pain.  Pain is worth with coughing or deep breathing.  Is primarily on the left side and is along the breastbone.  Pt continues to smoke but does not have any significant sputum production.  No fevers/chills.  Pt was recently switched to pravastatin due to medicaid formulary change.  Reports no problems with this new medication.  Is now time to check LDL to determine if patient requires titration.  Patient also reports polyuria that has been ongoing for some time.  No burning with urination or fevers/chills.  No flank pain.  No incontinence.  Smoking status reviewed.  Objective:  Filed Vitals:   08/18/11 1611  BP: 123/85  Pulse: 106  Temp: 97.7 F (36.5 C)   Gen: NAD CV: RRR Resp: CTABL, decreased bilaterally.  No focal findings. Chest: Pain on palpation over multiple costochondral joints. Ext: No edema  Assessment/Plan:  Polyuria is non-specific in character.  No evidence of infection on UA.  Will continue to watch and wait.  Costochondritis:  NSAIDs/cough suppressants.  Urged smoking cessation but pt resistant. Please also see individual problems in problem list for problem-specific plans.

## 2011-09-19 NOTE — Assessment & Plan Note (Signed)
Tolerating pravachol OK.  Direct LDL today.

## 2011-09-19 NOTE — Assessment & Plan Note (Signed)
Mobic/Tessalon today.  No signs of infection.  If persists, will probably get a CXR.

## 2011-09-26 ENCOUNTER — Telehealth: Payer: Self-pay | Admitting: Family Medicine

## 2011-09-26 NOTE — Telephone Encounter (Signed)
Pt called to say that she has been getting these injections in her knee and only has 2 more left.  It has helped her to be able to walk.  Really needs this done tomorrow.

## 2011-09-26 NOTE — Telephone Encounter (Signed)
[name] Long,  I got a call from an Orthopaedic office, they said they would call back tomorrow afternoon or the next day, asking for [name] Long approval for an injection.  There is no referral and she hasn't been seen since 2/14 so I let that office know that I could not give the NPI # for that visit. I just wanted to let you know about this.  Desma Mcgregor

## 2011-09-28 NOTE — Telephone Encounter (Signed)
If the patient is wanting a new referral to the ortho office, we will have to talk about it in person. Assuming that she is just getting steroid injections, she does not NEED to be going to a specialist's office for those.  If the ortho office is just asking if it is OK with [place] to do this, I am fine with it.  I think it would probably be better for [place], or possibly sports med, to do the injections, however.

## 2011-09-29 NOTE — Telephone Encounter (Signed)
[facility] calling to request NPI #.  Patient has an appt today for hyalgan joint injection in knee.  This is last injection in series of 5.  Spoke with Martie Lee and gave NPI # for 09/19/11 and today.  Gaylene Brooks, RN

## 2011-11-01 ENCOUNTER — Ambulatory Visit (INDEPENDENT_AMBULATORY_CARE_PROVIDER_SITE_OTHER): Payer: Medicaid Other | Admitting: Family Medicine

## 2011-11-01 ENCOUNTER — Encounter: Payer: Self-pay | Admitting: Family Medicine

## 2011-11-01 VITALS — BP 138/94 | HR 90 | Temp 97.3°F | Ht 64.0 in | Wt 158.3 lb

## 2011-11-01 DIAGNOSIS — M159 Polyosteoarthritis, unspecified: Secondary | ICD-10-CM

## 2011-11-01 DIAGNOSIS — R079 Chest pain, unspecified: Secondary | ICD-10-CM

## 2011-11-01 DIAGNOSIS — R0781 Pleurodynia: Secondary | ICD-10-CM

## 2011-11-01 DIAGNOSIS — E7800 Pure hypercholesterolemia, unspecified: Secondary | ICD-10-CM

## 2011-11-01 MED ORDER — TRAMADOL HCL 50 MG PO TABS: 50.0000 mg | ORAL_TABLET | Freq: Three times a day (TID) | ORAL | Status: AC | PRN

## 2011-11-01 MED ORDER — MELOXICAM 15 MG PO TABS: 15.0000 mg | ORAL_TABLET | Freq: Every day | ORAL | Status: DC

## 2011-11-01 NOTE — Patient Instructions (Signed)
Go to [facility] on [address] to get your x-ray. I have sent in two medicines for you.  Mobic is one to take every day.  Tramadol is to take on days when you are hurting particularly badly. Come back to see me in two weeks and bring all your medicines. I am going to send our social worker a message to help with the section 8 housing.

## 2011-11-01 NOTE — Progress Notes (Signed)
Patient ID: [name] Long, female   DOB: 02-22-1951, 61 y.o.   MRN: [id number] Subjective: The patient is a 61 y.o. year old female who presents today for general followup.  1. Rib pain: Patient has been having problems with left-sided rib pain ever since her long bout of bronchitis this winter. It was brought on by recurrent coughing. He continues to day. Coughing makes it worse as does deep breathing. It is painful when she presses on it. She denies any night sweats, weight changes that are unintended, or significant sputum production.  2. Hyperlipidemia: The patient is very confused about what medicine she is taking for what. She cannot remember the name of the medicine she is supposed to be taking for her cholesterol. She did not bring her medications in with her today. She denies any significant muscle aches or muscle weakness.  3. Health maintenance: The patient thinks that she may be ready to undergo colonoscopy sometime in the next 6-8 months. She is not quite ready to commit to it today, but we will continue discussions about this in the future.  4. Osteoarthritis of the knees: Patient has now finished hyluronic acid injections in both knees and reports significant pain relief. She continues to see an orthopedist for this problem. She has known severe osteoarthritis of both knees for which she takes occasional Vicodin. She does not feel that she is in the mental state to undergo a knee replacement surgery.  Patient lives in section 8 housing and has some questions about someone being able to stay with her at night. Otherwise patient's past medical history medications and family history were reviewed and updated as appropriate. The patient is an everyday smoker and continues to do so. She was provided with education on smoking cessation.  Objective:  Filed Vitals:   11/01/11 1352  BP: 138/94  Pulse: 90  Temp: 97.3 F (36.3 C)   Gen:  Overweight female, no immediate distress CV: Regular  rate and rhythm, no murmurs Resp: Clear to auscultation bilaterally Chest: Patient has a fair amount of discomfort on palpation over most of the 10th rib on the left. She does not have significant discomfort elsewhere on the chest wall. Ext: No edema  Assessment/Plan:  Please also see individual problems in problem list for problem-specific plans.

## 2011-11-01 NOTE — Assessment & Plan Note (Signed)
I am happy for her to continue seeing her orthopedist. I have discussed with her the fact that her chances of needing knee replacement surgery in the next 5 years are relatively high. We'll continue to discuss this with her as the opportunity allows.

## 2011-11-01 NOTE — Assessment & Plan Note (Signed)
Patient does not seem to be is suffering any side effects from the medication. I've encouraged her to bring all medications in with her to her next visit so we can go over them and make certain that her med rec is up to date.

## 2011-11-01 NOTE — Assessment & Plan Note (Signed)
The patient's pain is most likely related to costochondritis, however occult rib fracture cannot be excluded. I will treat the patient symptomatically with Mobic and tramadol. We'll also obtain an x-ray to make certain there is not an occult fracture. If there is not, and she does not respond to the Mobic and tramadol, we may try a short burst of steroids.

## 2011-11-02 ENCOUNTER — Telehealth: Payer: Self-pay | Admitting: Family Medicine

## 2011-11-02 NOTE — Telephone Encounter (Signed)
LMOM for patient to call back. After speaking with Dr. Louanne Belton about this, a script will not be written for this and that patient can go buy one OTC. It is understood that medicaid will not pay for this unless a script is written. Patient is controlled by diet and metformin and there is not a need for a script to be written for this. Her last A1C was 6.4 and there is no need for a script meter and medicaid may not pay due to a controlled dx

## 2011-11-02 NOTE — Telephone Encounter (Signed)
Lost her Blood sugar meter and needs another script for medicaid to pay for it.  [facility]- [address]

## 2011-11-04 NOTE — Telephone Encounter (Signed)
[name] Long never returned my phone call but obviously has made an appointment with you 5/6 to discuss this. I did not make this appointment for her

## 2011-11-04 NOTE — Telephone Encounter (Signed)
We can certainly discuss this.  However, as per [name] Long's documentation below, the patient does not have a medical need for a meter as she is not on any medications that cause hypoglycemia.  If her A1c creeps up and we have to start a medication such as glipizide I would be willing to give her one prescription per year, however until that time, this is not a medical necessity and she can purchase one OTC.

## 2011-11-07 ENCOUNTER — Ambulatory Visit: Payer: Medicaid Other | Admitting: Family Medicine

## 2011-11-07 ENCOUNTER — Other Ambulatory Visit: Payer: Self-pay | Admitting: Family Medicine

## 2011-11-07 DIAGNOSIS — M159 Polyosteoarthritis, unspecified: Secondary | ICD-10-CM

## 2011-11-08 ENCOUNTER — Other Ambulatory Visit: Payer: Self-pay | Admitting: Family Medicine

## 2011-11-09 ENCOUNTER — Telehealth: Payer: Self-pay | Admitting: Clinical

## 2011-11-09 NOTE — Telephone Encounter (Signed)
Clinical Child psychotherapist (CSW) received a referral to assist pt who currently lives in Section 8 Housing and needs a letter from her PCP. CSW spoke with pt who informed CSW that she is in need of a caregiver in the evenings to stay with her due to her medical and mental illness. Pt stated she has several friends and family that are able to stay however [employer] is requesting a written letter from her PCP. CSW informed pt that she would inform her PCP of what to include in the letter as CSW has received verbal consent for PCP to fax/mail letter to [facility] ([address] [place] [address]). CSW also explored whether pt is being managed by a psychiatrist for depression & bipolar. Pt reports seeing a psychiatrist since 1991 at the [facility]. Pt appreciative of CSW assistance and has no further CSW needs.   Theresia Bough, MSW, Theresia Majors [phone]

## 2011-11-09 NOTE — Telephone Encounter (Signed)
Message copied by Theresia Bough on Wed Nov 09, 2011 10:18 AM ------      Message from: [facility], [place]      Created: Tue Nov 01, 2011  2:53 PM      Regarding: Section 8       Nelva Bush, could you please give this patient a call.  She has a number of psychiatric issues and has some concern with Section 8 housing about having someone stay with her at night.  Could you check into what I need to say in a letter?  Thanks!

## 2011-11-11 NOTE — Telephone Encounter (Signed)
Please send letter to Please mail to [facility] ([address] [place] [address]). You may close encounter after that or rout back to me to close.

## 2011-11-21 ENCOUNTER — Ambulatory Visit: Payer: Medicaid Other | Admitting: Family Medicine

## 2011-12-20 ENCOUNTER — Encounter: Payer: Self-pay | Admitting: Family Medicine

## 2011-12-20 ENCOUNTER — Ambulatory Visit (INDEPENDENT_AMBULATORY_CARE_PROVIDER_SITE_OTHER): Payer: Medicaid Other | Admitting: Family Medicine

## 2011-12-20 VITALS — BP 127/83 | HR 89 | Ht 64.0 in | Wt 156.0 lb

## 2011-12-20 DIAGNOSIS — R0781 Pleurodynia: Secondary | ICD-10-CM

## 2011-12-20 DIAGNOSIS — R079 Chest pain, unspecified: Secondary | ICD-10-CM

## 2011-12-20 DIAGNOSIS — M549 Dorsalgia, unspecified: Secondary | ICD-10-CM

## 2011-12-20 MED ORDER — MELOXICAM 15 MG PO TABS: 15.0000 mg | ORAL_TABLET | Freq: Every day | ORAL | Status: DC

## 2011-12-20 NOTE — Progress Notes (Signed)
  Subjective:    Patient ID: [name] Long, female    DOB: 16-Dec-1950, 61 y.o.   MRN: [id number]  HPI Here for follow-up of chronic pain.  Patient unable to see PCP due to being on same day patient only status due to frequent no shows.  Has orthopedist (Dr. Madelon Lips) who prescribes vicodin for low back pain and knee injections with hylauronic acid.  Has appointment with him on the 20th.  Here to follow-up on rib pain.  Patient states has continued since October when she had pneumonia.  Is improving on meloxicam.  Still painful.  Worse when sleeping on left side or when prolonged standing.  Worse with coughing.  NO radiation.  No numbness, weakness, tingling.  Did not get rib xray as previously ordered.     Review of Systems See HPI    Objective:   Physical Exam GEN: Alert & Oriented, No acute distress CV:  Regular Rate & Rhythm, no murmur Respiratory:  Normal work of breathing, CTAB Abd:  + BS, soft, no tenderness to palpation MSK:  ttp rib under left breast.  Some tenderness laterally.  No pain over spine.        Assessment & Plan:

## 2011-12-20 NOTE — Patient Instructions (Addendum)
Use meloxicam for pain  Will send you to get xray f your ribs and back  Make a follow-up appointment to discuss diabetes and chronic health.

## 2011-12-20 NOTE — Assessment & Plan Note (Addendum)
Patient never got rib xray.  Will obtain this as well as thoracic and lumbar spine to rule out stress fracture.  Refilled meloxicam.  Patient to continue to follow-up with orthopedist.  Update:  Xray or L and T spine as well as area of pain localized at 11th rib does not show fracture.  ? Atelectasis seen in left lobe does not clinically correlate with patients symtpoms.

## 2011-12-20 NOTE — Addendum Note (Signed)
Addended by: Jennette Bill on: 12/20/2011 02:43 PM   Modules accepted: Orders

## 2011-12-21 ENCOUNTER — Ambulatory Visit
Admission: RE | Admit: 2011-12-21 | Discharge: 2011-12-21 | Disposition: A | Discharge status: Home / Self Care | Payer: Medicaid Other | Source: Ambulatory Visit | Attending: Family Medicine | Admitting: Family Medicine

## 2011-12-21 DIAGNOSIS — R0781 Pleurodynia: Secondary | ICD-10-CM

## 2011-12-21 DIAGNOSIS — M549 Dorsalgia, unspecified: Secondary | ICD-10-CM

## 2011-12-22 ENCOUNTER — Telehealth: Payer: Self-pay | Admitting: *Deleted

## 2011-12-22 NOTE — Telephone Encounter (Signed)
i have reviewed results

## 2011-12-22 NOTE — Telephone Encounter (Signed)
Message left on our office voicemail per [facility] with [facility].  Patient had T-spine, L-spine, & left rib xrays.  Results were slightly abnormal and just wanted Dr. Earnest Bailey to be aware of this.  Called our office yesterday, but our office had already closed.  Note routed to Dr. Earnest Bailey.  Gaylene Brooks, RN

## 2011-12-23 ENCOUNTER — Telehealth: Payer: Self-pay | Admitting: Family Medicine

## 2011-12-23 DIAGNOSIS — R918 Other nonspecific abnormal finding of lung field: Secondary | ICD-10-CM

## 2011-12-23 NOTE — Telephone Encounter (Signed)
LM for patient to call back.

## 2011-12-23 NOTE — Telephone Encounter (Signed)
LVM for patient to call back. ?

## 2011-12-23 NOTE — Telephone Encounter (Signed)
Could you call [name] Long and ask her to go get a full chest x-ray.  Let her know that there was something we couldn't quite see well enough on the rib film.  If there is anything I am concerned about after seeing the full chest x-ray I will give her a call.

## 2011-12-27 NOTE — Telephone Encounter (Addendum)
Called and informed pt and she will go have this done by the end of the week.Laureen Ochs, Viann Shove

## 2011-12-30 ENCOUNTER — Ambulatory Visit
Admission: RE | Admit: 2011-12-30 | Discharge: 2011-12-30 | Disposition: A | Discharge status: Home / Self Care | Payer: Medicaid Other | Source: Ambulatory Visit | Attending: Family Medicine | Admitting: Family Medicine

## 2011-12-30 DIAGNOSIS — R918 Other nonspecific abnormal finding of lung field: Secondary | ICD-10-CM

## 2012-01-26 ENCOUNTER — Other Ambulatory Visit (HOSPITAL_COMMUNITY)
Admission: RE | Admit: 2012-01-26 | Discharge: 2012-01-26 | Disposition: A | Discharge status: Home / Self Care | Payer: Medicaid Other | Source: Ambulatory Visit | Attending: Family Medicine | Admitting: Family Medicine

## 2012-01-26 ENCOUNTER — Encounter: Payer: Self-pay | Admitting: Family Medicine

## 2012-01-26 ENCOUNTER — Ambulatory Visit (INDEPENDENT_AMBULATORY_CARE_PROVIDER_SITE_OTHER): Payer: Medicaid Other | Admitting: Family Medicine

## 2012-01-26 VITALS — BP 132/89 | HR 88 | Ht 64.0 in | Wt 146.0 lb

## 2012-01-26 DIAGNOSIS — R0781 Pleurodynia: Secondary | ICD-10-CM

## 2012-01-26 DIAGNOSIS — N760 Acute vaginitis: Secondary | ICD-10-CM

## 2012-01-26 DIAGNOSIS — Z202 Contact with and (suspected) exposure to infections with a predominantly sexual mode of transmission: Secondary | ICD-10-CM

## 2012-01-26 DIAGNOSIS — Z9189 Other specified personal risk factors, not elsewhere classified: Secondary | ICD-10-CM

## 2012-01-26 DIAGNOSIS — Z113 Encounter for screening for infections with a predominantly sexual mode of transmission: Secondary | ICD-10-CM | POA: Insufficient documentation

## 2012-01-26 DIAGNOSIS — E119 Type 2 diabetes mellitus without complications: Secondary | ICD-10-CM

## 2012-01-26 DIAGNOSIS — R079 Chest pain, unspecified: Secondary | ICD-10-CM

## 2012-01-26 LAB — POCT WET PREP (WET MOUNT)
Clue Cells Wet Prep Whiff POC: POSITIVE
WBC, Wet Prep HPF POC: <5

## 2012-01-26 MED ORDER — MELOXICAM 15 MG PO TABS: 15.0000 mg | ORAL_TABLET | Freq: Every day | ORAL | Status: DC
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
## 2012-01-26 NOTE — Patient Instructions (Addendum)
I have refilled your meloxicam for pain  Make a follow-up to discuss your diabetes  I will call you with results

## 2012-01-27 MED ORDER — METRONIDAZOLE 500 MG PO TABS: 500.0000 mg | ORAL_TABLET | Freq: Two times a day (BID) | ORAL | Status: AC

## 2012-01-27 NOTE — Assessment & Plan Note (Signed)
Will treat with metronidazole.  She declined to give a urine sample for dysuria.

## 2012-01-27 NOTE — Assessment & Plan Note (Signed)
Rib pain likely MSk muscle strain, worse with movement.  No evidence of fracture rib xray nor on thoracic of lumbar spine.  No abdominal pain or pleural type pain.  Will refill meloxicam for limited time use.

## 2012-01-30 ENCOUNTER — Telehealth: Payer: Self-pay | Admitting: Family Medicine

## 2012-01-30 NOTE — Telephone Encounter (Addendum)
Pt is returning call to Dr Earnest Bailey  Is saying that she is supposed to come back for UA and blood work for DM - do not see orders -

## 2012-01-30 NOTE — Telephone Encounter (Signed)
Patient chose to leave before giving urine sample that day.  She is to make a follow-up appt to address diabetes.  We did not speak about any xrays.  She will need to make a follow-up appt to addresss her concerns.

## 2012-01-30 NOTE — Telephone Encounter (Signed)
Dr. Earnest Bailey, I spoke with patient and she stated that at the last visit you have informed her to come back to do her UA for an UTI you believed she has. Also about the A1C it seems like it is time for that. Also she is asking for x rays to see if she has an UTI because of her abdominal pain.

## 2012-01-30 NOTE — Telephone Encounter (Signed)
patient has appointment 8/5

## 2012-02-06 ENCOUNTER — Encounter: Payer: Self-pay | Admitting: Family Medicine

## 2012-02-06 ENCOUNTER — Ambulatory Visit (INDEPENDENT_AMBULATORY_CARE_PROVIDER_SITE_OTHER): Payer: Medicaid Other | Admitting: Family Medicine

## 2012-02-06 VITALS — BP 117/78 | HR 87 | Temp 98.5°F | Ht 64.0 in | Wt 149.7 lb

## 2012-02-06 DIAGNOSIS — E1129 Type 2 diabetes mellitus with other diabetic kidney complication: Secondary | ICD-10-CM

## 2012-02-06 DIAGNOSIS — N182 Chronic kidney disease, stage 2 (mild): Secondary | ICD-10-CM

## 2012-02-06 DIAGNOSIS — E119 Type 2 diabetes mellitus without complications: Secondary | ICD-10-CM

## 2012-02-06 DIAGNOSIS — M159 Polyosteoarthritis, unspecified: Secondary | ICD-10-CM

## 2012-02-06 DIAGNOSIS — I10 Essential (primary) hypertension: Secondary | ICD-10-CM

## 2012-02-06 DIAGNOSIS — E7800 Pure hypercholesterolemia, unspecified: Secondary | ICD-10-CM

## 2012-02-06 DIAGNOSIS — R079 Chest pain, unspecified: Secondary | ICD-10-CM

## 2012-02-06 DIAGNOSIS — R0781 Pleurodynia: Secondary | ICD-10-CM

## 2012-02-06 DIAGNOSIS — N058 Unspecified nephritic syndrome with other morphologic changes: Secondary | ICD-10-CM

## 2012-02-06 DIAGNOSIS — F319 Bipolar disorder, unspecified: Secondary | ICD-10-CM

## 2012-02-06 DIAGNOSIS — R300 Dysuria: Secondary | ICD-10-CM

## 2012-02-06 LAB — CBC WITH DIFFERENTIAL/PLATELET
Basophils Absolute: 0 10*3/uL (ref 0.0–0.1)
Basophils Relative: 0 % (ref 0–1)
Eosinophils Absolute: 0.2 10*3/uL (ref 0.0–0.7)
Eosinophils Relative: 4 % (ref 0–5)
HCT: 41 % (ref 36.0–46.0)
Hemoglobin: 14.4 g/dL (ref 12.0–15.0)
Lymphocytes Relative: 52 % — ABNORMAL HIGH (ref 12–46)
Lymphs Abs: 3.1 10*3/uL (ref 0.7–4.0)
MCH: 36.1 pg — ABNORMAL HIGH (ref 26.0–34.0)
MCHC: 35.1 g/dL (ref 30.0–36.0)
MCV: 102.8 fL — ABNORMAL HIGH (ref 78.0–100.0)
Monocytes Absolute: 0.6 10*3/uL (ref 0.1–1.0)
Monocytes Relative: 10 % (ref 3–12)
Neutro Abs: 2 10*3/uL (ref 1.7–7.7)
Neutrophils Relative %: 34 % — ABNORMAL LOW (ref 43–77)
Platelets: 193 10*3/uL (ref 150–400)
RBC: 3.99 MIL/uL (ref 3.87–5.11)
RDW: 14.9 % (ref 11.5–15.5)
WBC: 5.9 10*3/uL (ref 4.0–10.5)

## 2012-02-06 LAB — POCT URINALYSIS DIPSTICK
Bilirubin, UA: NEGATIVE
Blood, UA: NEGATIVE
Glucose, UA: NEGATIVE
Ketones, UA: NEGATIVE
Leukocytes, UA: NEGATIVE
Nitrite, UA: NEGATIVE
Protein, UA: NEGATIVE
Spec Grav, UA: 1.015
Urobilinogen, UA: 0.2
pH, UA: 6

## 2012-02-06 LAB — BASIC METABOLIC PANEL
BUN: 15 mg/dL (ref 6–23)
CO2: 28 mEq/L (ref 19–32)
Calcium: 9.1 mg/dL (ref 8.4–10.5)
Chloride: 100 mEq/L (ref 96–112)
Creat: 1.21 mg/dL — ABNORMAL HIGH (ref 0.50–1.10)
Glucose, Bld: 96 mg/dL (ref 70–99)
Potassium: 3.3 mEq/L — ABNORMAL LOW (ref 3.5–5.3)
Sodium: 138 mEq/L (ref 135–145)

## 2012-02-06 LAB — LDL CHOLESTEROL, DIRECT: Direct LDL: 105 mg/dL — ABNORMAL HIGH

## 2012-02-06 MED ORDER — MELOXICAM 15 MG PO TABS: 15.0000 mg | ORAL_TABLET | Freq: Every day | ORAL | Status: DC

## 2012-02-06 MED ORDER — METHOCARBAMOL 500 MG PO TABS: 500.0000 mg | ORAL_TABLET | Freq: Three times a day (TID) | ORAL | Status: DC | PRN

## 2012-02-06 NOTE — Patient Instructions (Addendum)
You were diagnosed with diabetes in the 1990s.  You were on Lipitor in 2011-2012.

## 2012-02-09 ENCOUNTER — Encounter: Payer: Self-pay | Admitting: Family Medicine

## 2012-02-09 NOTE — Assessment & Plan Note (Signed)
Patient is currently taking Crestor and tolerating it well. She is due for a cholesterol recheck today.

## 2012-02-09 NOTE — Assessment & Plan Note (Signed)
Patient continues to have problems with pain along her left lower ribs. She reports that it is helped by Mobic. I will provide a refill on this and I suggested that she speak with her orthopedist about this issue.

## 2012-02-09 NOTE — Assessment & Plan Note (Signed)
Will recheck kidney function today. Patient is currently asymptomatic.

## 2012-02-09 NOTE — Assessment & Plan Note (Signed)
Patient reports that she is continuing to have pain in bilateral knees. She has had biologic gel injections orthopedists. They will repeat these in 6 months. She reports that they have offered her again in from steroid injection. I informed her that she was welcome to make an appointment with them for that or that she did make an appointment with [place] in the next week for this.

## 2012-02-10 NOTE — Progress Notes (Signed)
Patient ID: [name] Long, female   DOB: August 06, 1950, 61 y.o.   MRN: [id number] Subjective: The patient is a 61 y.o. year old female who presents today for followup of multiple issues.  1. Rib pain: Patient reports that the Mobic is helpful for at least 12 hours. She no longer has any pills remaining. She inquires about the results of her x-rays and was informed that they demonstrated no obvious abnormalities. Her pain still remains, is located over her left lower ribs and is made worse by palpation.  Not worse with deep breaths, no worse with activity.  Not associated with N/V/D.  2. HLD:  Patient is asking for information on when she was on Lipitor because she saw on TV that lipitor can cause diabetes and that there is a law suit about this that she could joint.  She is currently on Crestor and is tolerating it well.  Due for LDL today.  3. HTN: Controlled today.  Relatively compliant with meds.  Not checking BP regularly.  No CP/SOB/headaches/visual changes/LE edema.  4. OA: has finished jell injections, not eligible for repeat for at least 6 months.  Orthopedist reports she can have steroid injection in the interim.  She reports the injections have helped her pain but that it is gradually returning.  Would be interested in injection at some point in the future.  Patient's past medical, social, and family history were reviewed and updated as appropriate. History  Substance Use Topics  . Smoking status: Current Everyday Smoker -- 0.5 packs/day for 39 years    Types: Cigarettes  . Smokeless tobacco: Not on file  . Alcohol Use: Not on file   Objective:  Filed Vitals:   02/06/12 1623  BP: 117/78  Pulse: 87  Temp: 98.5 F (36.9 C)   Gen: NAD, overweight CV: RRR Resp: CTABL Ext: No edema, 2+ pulses  Assessment/Plan:  Please also see individual problems in problem list for problem-specific plans.

## 2012-02-13 ENCOUNTER — Encounter: Payer: Self-pay | Admitting: Family Medicine

## 2012-02-28 ENCOUNTER — Encounter: Payer: Self-pay | Admitting: Family Medicine

## 2012-02-28 ENCOUNTER — Ambulatory Visit (INDEPENDENT_AMBULATORY_CARE_PROVIDER_SITE_OTHER): Payer: Medicaid Other | Admitting: Family Medicine

## 2012-02-28 VITALS — BP 131/74 | HR 84 | Temp 98.1°F | Wt 149.0 lb

## 2012-02-28 DIAGNOSIS — N058 Unspecified nephritic syndrome with other morphologic changes: Secondary | ICD-10-CM

## 2012-02-28 DIAGNOSIS — R079 Chest pain, unspecified: Secondary | ICD-10-CM

## 2012-02-28 DIAGNOSIS — R0781 Pleurodynia: Secondary | ICD-10-CM

## 2012-02-28 DIAGNOSIS — E1129 Type 2 diabetes mellitus with other diabetic kidney complication: Secondary | ICD-10-CM

## 2012-02-28 DIAGNOSIS — Z1211 Encounter for screening for malignant neoplasm of colon: Secondary | ICD-10-CM

## 2012-02-28 LAB — POCT GLYCOSYLATED HEMOGLOBIN (HGB A1C): Hemoglobin A1C: 5.9

## 2012-02-28 MED ORDER — MELOXICAM 15 MG PO TABS: 15.0000 mg | ORAL_TABLET | Freq: Every day | ORAL | Status: DC

## 2012-02-28 NOTE — Progress Notes (Signed)
Patient ID: [name] Long, female   DOB: [birth year], 61 y.o.   MRN: [id number] Subjective: The patient is a 61 y.o. year old female who presents today for f/u.  1. Abd discomfort: Patient reports that every morning she has a sensation of something moving in her left upper abdomen.  It is not necessarily painful but she notices it.  She reports that she continues to have some rib pain in this area as well.  This is relieved by mobic.  Has previously seen Dr. Maple Hudson on [place] who, about 9 months ago, recommended screening colonoscopy, per patient report.  Patient's past medical, social, and family history were reviewed and updated as appropriate. History  Substance Use Topics  . Smoking status: Current Everyday Smoker -- 0.5 packs/day for 39 years    Types: Cigarettes  . Smokeless tobacco: Not on file  . Alcohol Use: Not on file   Objective:  Filed Vitals:   02/28/12 1412  BP: 131/74  Pulse: 84  Temp: 98.1 F (36.7 C)   Gen: NAD, obese Abd: Soft, tender to palpation over the costochondral junction of the 10th and 11th rib on the left.  No gross deformity.  No organomegaly.  Assessment/Plan:  Please also see individual problems in problem list for problem-specific plans.

## 2012-02-28 NOTE — Assessment & Plan Note (Signed)
Patient continues to have some discomfort in the costochondral region with no imaging findings indicating any pathology.  She has continued concerns about "tumors on my internal organs" or other significant findings and was requesting an ultrasound to evaluate her LUQ discomfort.  I have used this as an opportunity to push for colonoscopy, something she has been putting off for the last two years, as [place] is not appropriate for LUQ imaging.  If GI feels that additional workup is necessary, I will defer this to them.

## 2012-02-28 NOTE — Assessment & Plan Note (Signed)
A1c today.  

## 2012-03-13 ENCOUNTER — Telehealth: Payer: Self-pay | Admitting: Family Medicine

## 2012-03-13 NOTE — Telephone Encounter (Signed)
Patient needs forms faxed to Medicaid transportation from her appt on 7/25, 8/5 and 8/27.

## 2012-03-14 NOTE — Telephone Encounter (Signed)
Called pt to let her know that we do not have these forms and she stated that she will drop them off to be completed.[name] Long

## 2012-03-16 ENCOUNTER — Telehealth: Payer: Self-pay | Admitting: Family Medicine

## 2012-03-16 NOTE — Telephone Encounter (Signed)
Patient is calling about the forms she was told she needed to bring for the [employer] transportation.  Since she doesn't have anyway to get here, she called [employer] transporatation to find out what she needed to do, they said that the office just needs to write on a piece of paper, the dates she was seen and fax it to them.  She said she was seen on 8/27, 7/25 fax to [phone].

## 2012-03-20 ENCOUNTER — Encounter: Payer: Self-pay | Admitting: Family Medicine

## 2012-03-20 ENCOUNTER — Other Ambulatory Visit: Payer: Self-pay | Admitting: Family Medicine

## 2012-03-20 NOTE — Telephone Encounter (Signed)
Faxed letter out 

## 2012-03-22 ENCOUNTER — Other Ambulatory Visit: Payer: Self-pay | Admitting: Family Medicine

## 2012-03-29 ENCOUNTER — Telehealth: Payer: Self-pay | Admitting: Family Medicine

## 2012-03-29 ENCOUNTER — Ambulatory Visit: Payer: Medicaid Other | Admitting: Family Medicine

## 2012-03-29 NOTE — Telephone Encounter (Signed)
Forwarded to pcp.Barkley, Tonya Lynetta  

## 2012-03-29 NOTE — Telephone Encounter (Signed)
Patient is calling because she says that things have gotten very critical with her daughter.  Dr. Maple Hudson put her on a steroid for her ribs.  She wants to let Dr. Louanne Belton know that she isn't able to get out but she is doing ok.  She hasn't had the Colonoscopy yet.  She is concerned that due to missing her appt today, she doesn't want Dr. Louanne Belton to put her on probation or anything.  She has all of her medications and that she doesn't feel like she needs to be seen but if she does, she will call back and reschedule.

## 2012-04-02 NOTE — Telephone Encounter (Signed)
I am not going to be putting her on probation for a single instance (she has kept her last several appointments).  I would appreciate it if she could let us know 24 hours in advance, however.  I will plan to communicate this to her at our next visit.

## 2012-04-27 ENCOUNTER — Ambulatory Visit (INDEPENDENT_AMBULATORY_CARE_PROVIDER_SITE_OTHER): Payer: Medicaid Other | Admitting: Family Medicine

## 2012-04-27 ENCOUNTER — Other Ambulatory Visit: Payer: Self-pay | Admitting: Family Medicine

## 2012-04-27 ENCOUNTER — Encounter: Payer: Self-pay | Admitting: Family Medicine

## 2012-04-27 ENCOUNTER — Other Ambulatory Visit (HOSPITAL_COMMUNITY)
Admission: RE | Admit: 2012-04-27 | Discharge: 2012-04-27 | Disposition: A | Discharge status: Home / Self Care | Payer: Medicaid Other | Source: Ambulatory Visit | Attending: Family Medicine | Admitting: Family Medicine

## 2012-04-27 VITALS — BP 113/71 | HR 84 | Temp 98.1°F | Ht 64.0 in | Wt 152.0 lb

## 2012-04-27 DIAGNOSIS — R079 Chest pain, unspecified: Secondary | ICD-10-CM | POA: Insufficient documentation

## 2012-04-27 DIAGNOSIS — Z23 Encounter for immunization: Secondary | ICD-10-CM

## 2012-04-27 DIAGNOSIS — N760 Acute vaginitis: Secondary | ICD-10-CM

## 2012-04-27 DIAGNOSIS — J449 Chronic obstructive pulmonary disease, unspecified: Secondary | ICD-10-CM

## 2012-04-27 DIAGNOSIS — Z1211 Encounter for screening for malignant neoplasm of colon: Secondary | ICD-10-CM

## 2012-04-27 DIAGNOSIS — Z113 Encounter for screening for infections with a predominantly sexual mode of transmission: Secondary | ICD-10-CM | POA: Insufficient documentation

## 2012-04-27 LAB — POCT WET PREP (WET MOUNT): Clue Cells Wet Prep Whiff POC: POSITIVE

## 2012-04-27 MED ORDER — ALBUTEROL SULFATE (2.5 MG/3ML) 0.083% IN NEBU: 2.5000 mg | INHALATION_SOLUTION | Freq: Four times a day (QID) | RESPIRATORY_TRACT | Status: DC | PRN

## 2012-04-27 MED ORDER — FLUCONAZOLE 150 MG PO TABS: 150.0000 mg | ORAL_TABLET | Freq: Once | ORAL | Status: DC

## 2012-04-27 MED ORDER — NITROGLYCERIN 0.4 MG SL SUBL: 0.4000 mg | SUBLINGUAL_TABLET | SUBLINGUAL | Status: DC | PRN

## 2012-04-27 MED ORDER — ALBUTEROL SULFATE HFA 108 (90 BASE) MCG/ACT IN AERS: 2.0000 | INHALATION_SPRAY | RESPIRATORY_TRACT | Status: DC | PRN

## 2012-04-27 NOTE — Patient Instructions (Signed)
It was good to see you today! Your diabetes testing is not due for another month. I have sent in a prescription for a yeast infection.  We will be in touch with you if there are any other findings on your tests from today. I want you to call your cardiologist and get in to see him about your chest pain. We will get you stool cards to do in place of the colonoscopy.  You need to do them every year but if they don't show any blood they can set your mind at ease. Please come back in 1-2 months.

## 2012-04-27 NOTE — Assessment & Plan Note (Signed)
Highly atypical.  Given history of chostocondritis (with pain in very similar area) and cough feel MSK is much more likely.  However, will still rec f/u with cardiology given risk factors.  She is due to see cardiologist.

## 2012-04-27 NOTE — Assessment & Plan Note (Signed)
Wet prep GC/Chl collected.  Most likely diagnosis is candida.  Will presumptively treat while awaiting test results.

## 2012-04-27 NOTE — Progress Notes (Signed)
Patient ID: [name] Long, female   DOB: [birth year], 61 y.o.   MRN: [id number] Subjective: The patient is a 61 y.o. year old female who presents today for vaginal itching/burning.  Vaginitis: Patient complains of vaginal symptoms include burning.Vulvar symptoms include none.STI Risk: Possible STD exposure. Other associated symptoms: none.  Chest Pain: Patient complains of chest pain. Onset was 1 month ago, with unchanged course since that time. The patient describes the pain as constant, sharp in nature, radiates to the upper back. Patient rates pain as a 3/10 in intensity.  Associated symptoms are none. Aggravating factors are none.  Alleviating factors are: nitroglycerin 1-2 tablets. Patient's cardiac risk factors are diabetes mellitus, dyslipidemia, hypertension, obesity (BMI >= 30 kg/m2), sedentary lifestyle and smoking/ tobacco exposure.  Patient's risk factors for DVT/PE: none. Previous cardiac testing: electrocardiogram (ECG) and various other cardiac tests per cardiology.  Patient's past medical, social, and family history were reviewed and updated as appropriate. History  Substance Use Topics  . Smoking status: Current Every Day Smoker -- 0.5 packs/day for 39 years    Types: Cigarettes  . Smokeless tobacco: Not on file  . Alcohol Use: Not on file   Objective:  Filed Vitals:   04/27/12 0944  BP: 113/71  Pulse: 84  Temp: 98.1 F (36.7 C)   Gen: NAD, obese CV: RRR, no murmurs.  Reproducible pain on palpation over 5th and 6th ribs as costochondral junction Resp: Diffuse wheezing, cough, no focal findings Pelvic exam: VULVA: normal appearing vulva with no masses, tenderness or lesions, VAGINA: vaginal discharge - white and curd-like, DNA probe for chlamydia and GC obtained, CERVIX: normal appearing cervix without discharge or lesions.  Assessment/Plan:  Please also see individual problems in problem list for problem-specific plans.

## 2012-04-30 ENCOUNTER — Encounter: Payer: Self-pay | Admitting: Family Medicine

## 2012-04-30 NOTE — Progress Notes (Signed)
Patient will need TSH at next office visit.

## 2012-05-01 ENCOUNTER — Telehealth: Payer: Self-pay | Admitting: Family Medicine

## 2012-05-01 MED ORDER — METRONIDAZOLE 500 MG PO TABS: 500.0000 mg | ORAL_TABLET | Freq: Two times a day (BID) | ORAL | Status: DC

## 2012-05-01 NOTE — Telephone Encounter (Signed)
LVM informing of antibiotic and asking to call back in morning.  Patient has BV (has had before), not an STD.  She has had that before.  I have called in antibiotics for that.  Could you call her and make certain she understands that this is not for an STD?  Thanks!

## 2012-05-02 NOTE — Telephone Encounter (Signed)
Called and informed pt of Abx called into pharmacy and that she ONLY has BV NOT an STD. Pt voiced understanding.Loralee Pacas [place]

## 2012-05-15 LAB — HEMOCCULT GUIAC POC 1CARD (OFFICE)
Card #2 Fecal Occult Blod, POC: NEGATIVE
Fecal Occult Blood, POC: NEGATIVE

## 2012-05-15 NOTE — Addendum Note (Signed)
Addended by: [place], BONNIE on: 05/15/2012 04:57 PM   Modules accepted: Orders

## 2012-05-25 ENCOUNTER — Ambulatory Visit: Payer: Medicaid Other | Admitting: Family Medicine

## 2012-05-30 ENCOUNTER — Encounter: Payer: Self-pay | Admitting: Family Medicine

## 2012-05-30 ENCOUNTER — Ambulatory Visit (INDEPENDENT_AMBULATORY_CARE_PROVIDER_SITE_OTHER): Payer: Medicaid Other | Admitting: Family Medicine

## 2012-05-30 VITALS — BP 126/80 | HR 100 | Temp 97.8°F | Ht 64.0 in | Wt 152.6 lb

## 2012-05-30 DIAGNOSIS — F17200 Nicotine dependence, unspecified, uncomplicated: Secondary | ICD-10-CM

## 2012-05-30 DIAGNOSIS — E039 Hypothyroidism, unspecified: Secondary | ICD-10-CM

## 2012-05-30 DIAGNOSIS — R079 Chest pain, unspecified: Secondary | ICD-10-CM

## 2012-05-30 DIAGNOSIS — E1129 Type 2 diabetes mellitus with other diabetic kidney complication: Secondary | ICD-10-CM

## 2012-05-30 DIAGNOSIS — R0781 Pleurodynia: Secondary | ICD-10-CM

## 2012-05-30 DIAGNOSIS — N058 Unspecified nephritic syndrome with other morphologic changes: Secondary | ICD-10-CM

## 2012-05-30 LAB — POCT GLYCOSYLATED HEMOGLOBIN (HGB A1C): Hemoglobin A1C: 6.5

## 2012-05-30 MED ORDER — METHOCARBAMOL 500 MG PO TABS: 500.0000 mg | ORAL_TABLET | Freq: Three times a day (TID) | ORAL | Status: DC | PRN

## 2012-05-30 NOTE — Progress Notes (Signed)
Patient ID: GENESI BUFFIN, female   DOB: 07/14/1950, 61 y.o.   MRN: [id number] Subjective: The patient is a 61 y.o. year old female who presents today for f/u.  1. Thyroid: Weight has been stable.  No heat/cold intolerance.  Taking synthroid as prescribed.  2. Rib pain: Continues to be a problem.  In left side, worse with pressure or coughing.  Has been given steroids for this in the past.  Did not help.  Robaxin has helped in the past but patient is out of the med.  NSAIDs have not helped.  Operative diagnosis is prolonged costochondritis secondary to smoker's cough.  3. Diabetes: Only on oral meds.  No problems with low blood sugar.  Patient's past medical, social, and family history were reviewed and updated as appropriate. History  Substance Use Topics  . Smoking status: Current Every Day Smoker -- 0.5 packs/day for 39 years    Types: Cigarettes  . Smokeless tobacco: Not on file  . 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

## 2012-05-30 NOTE — Patient Instructions (Signed)
You need to get the shingles vaccine.  I will give you a prescription for this.  Many drug stores will take this prescription and give you the shot.  If they will not do so, you can bring the vial here and schedule a nurse visit for [place] to give the injection.  You can get your lab work done at [address], [place], [place].  Their phone number is [phone].  We will be getting some repeat labs in February.  You are not due for bone density until you are 66.

## 2012-06-06 ENCOUNTER — Encounter: Payer: Self-pay | Admitting: Family Medicine

## 2012-06-06 LAB — TSH: TSH: 0.594 u[IU]/mL (ref 0.350–4.500)

## 2012-06-08 ENCOUNTER — Telehealth: Payer: Self-pay | Admitting: Family Medicine

## 2012-06-08 ENCOUNTER — Emergency Department (INDEPENDENT_AMBULATORY_CARE_PROVIDER_SITE_OTHER)
Admission: EM | Admit: 2012-06-08 | Discharge: 2012-06-08 | Disposition: A | Discharge status: Home / Self Care | Payer: Medicaid Other | Source: Home / Self Care | Attending: Emergency Medicine | Admitting: Emergency Medicine

## 2012-06-08 ENCOUNTER — Encounter (HOSPITAL_COMMUNITY): Payer: Self-pay | Admitting: Emergency Medicine

## 2012-06-08 DIAGNOSIS — H8110 Benign paroxysmal vertigo, unspecified ear: Secondary | ICD-10-CM

## 2012-06-08 MED ORDER — MECLIZINE HCL 12.5 MG PO TABS: 12.5000 mg | ORAL_TABLET | Freq: Three times a day (TID) | ORAL | Status: DC | PRN

## 2012-06-08 NOTE — Telephone Encounter (Signed)
Thank you.  Patient called and voicemail left that it is ok for her to schedule an appt with Dr. Madelon Lips.

## 2012-06-08 NOTE — Telephone Encounter (Signed)
Patient is calling again, very insistent that she needs to be seen today.  Now she is saying if not with Dr. Madelon Lips, she would like to be seen here.  That was offered to her this morning when we had opening, but now we don't.  She knows that Dr. Louanne Belton isn't in clinic this afternoon, but she thinks he should be able to fit her in this afternoon.  I told her that the nurse or Dr. Louanne Belton would call her just as soon as they could but she wanted me to tell her what time.  I let her know that all I could do was let them know she has called again.

## 2012-06-08 NOTE — Telephone Encounter (Signed)
[name] at [facility] urgent care calling for NPI #.  Patient is being seen for dizziness.  NPI # given.  Gaylene Brooks, RN

## 2012-06-08 NOTE — Telephone Encounter (Signed)
Pt is asking to be referred today to Dr [name] Long office.  She woke up dizzy and her neck is very painful - she has been seeing Dr Madelon Lips for this but needs [place] to authorize her to see him today.  pls advise

## 2012-06-08 NOTE — Telephone Encounter (Signed)
Patient is calling back to check the status of the message she left 20 minutes ago.  I let her know that Dr. Louanne Belton is in clinic and I will let him know that she is calling back but she needs to give him some time to respond.

## 2012-06-08 NOTE — ED Provider Notes (Signed)
Chief Complaint  Patient presents with  . Dizziness    History of Present Illness:  [name] Long is a 61 year old female who presents with a two-day history of whirling vertigo. The patient states it began suddenly when she stood up and fell back on her bed. She describes it spinning sensation of her head and the room and feels off balance. The dizziness is worse with movement such as when she bends over and is somewhat better she lies completely still. She denies any difficulty hearing, ear pain, ringing in her ears. She has had some nasal congestion with yellow drainage. She denies any fever or chills. She's had no headache, diplopia, or blurry vision. She denies any neurological symptoms. She's had no shortness of breath, chest pain, tightness, pressure, or palpitations. She denies any history of cardiac disease or syncope.  Review of Systems:  Other than noted above, the patient denies any of the following symptoms: Systemic:  No fever, chills, fatigue, or weight loss. Eye:  No blurred vision, visual change or diplopia. ENT:  No ear pain, tinnitus, hearing loss, nasal congestion, or rhinorrhea. Cardiac:  No chest pain, dyspnea, palpitations or syncope. Neuro:  No headache, paresthesias, weakness, trouble with speech, coordination or ambulation.  PMFSH:  Past medical history, family history, social history, meds, and allergies were reviewed.  Physical Exam:   Vital signs:  BP 116/77  Pulse 86  Resp 18  SpO2 97% General:  Alert, oriented times 3, in no distress. Eye:  PERRL, full EOM, no nystagmus. ENT:  TMs and canals normal.  Nasal mucosa normal.  Pharynx clear. Neck:  No adenopathy, tenderness, or mass.  Thyroid normal.  No carotid bruit. Lungs:  Breath sounds clear and equal bilaterally.  No wheezes, rales or rhonchi. Heart:  Regular rhythm.  No gallops, murmers, or rubs. Neuro:  Alert and oriented times 3.  Cranial nerves intact.  No pronator drift.  Finger to nose normal.  No focal  weakness.  Sensation intact to light touch.  Romberg's sign negative, gait normal.  Able to do tandem gait well.  Dix-Hallpike maneuver was positive with both ureters down.  Assessment:  The encounter diagnosis was Benign positional vertigo.  I think she has benign positional vertigo due to canalithiasis, but I am unable to localize it. She reports that the symptoms on the Dix-Hallpike maneuver with both ears down. Right now and is going to treat with meclizine and suggested she come back if no better in a week.  Plan:   1.  The following meds were prescribed:   New Prescriptions   MECLIZINE (ANTIVERT) 12.5 MG TABLET    Take 1 tablet (12.5 mg total) by mouth 3 (three) times daily as needed.   2.  The patient was instructed in symptomatic care and handouts were given. 3.  The patient was told to return if becoming worse in any way, if no better in 3 or 4 days, and given some red flag symptoms that would indicate earlier return.    Reuben Likes, MD 06/08/12 [phone]

## 2012-06-08 NOTE — Telephone Encounter (Signed)
Spoke with patient to ask if Dr. Madelon Lips said that they would see her today and is that the reason she needed [place] to authorizer that, patient stated "no he did not, but I thought that if I called family practice first they will see me". She is now on her way to urgent care due to [place] not getting back to her RIGHT AWAY

## 2012-06-08 NOTE — ED Notes (Signed)
Patient reports one episode of dizziness yesterday morning.  Reports this am felt the same way.   Feels dizzy more frequently today, feeling " off balance".  Reports blowing mucus and coughing up congestion for 2 weeks.

## 2012-06-08 NOTE — Telephone Encounter (Signed)
2 options.  First, if someone has an opening this afternoon, she can be seen here.  I will not be able to see her due to an admission in the nursing home.  Second, if no appointments are available, I am fine with OKing a visit with Dr. Madelon Lips.

## 2012-06-14 ENCOUNTER — Other Ambulatory Visit: Payer: Self-pay | Admitting: Family Medicine

## 2012-06-15 NOTE — Assessment & Plan Note (Addendum)
Not interested in cessation at this time.  Reviewed with her the fact that she is at risk for cancer and that this is worsening her rib pain.

## 2012-06-15 NOTE — Assessment & Plan Note (Signed)
Refill robaxin.  No concerning pathology has been identified on imaging or blood work.  Will continue to treat symptomatically.

## 2012-06-15 NOTE — Assessment & Plan Note (Signed)
Obtain TSH for monitoring today.  No overt signs of hypo/hyper thyroid

## 2012-06-15 NOTE — Assessment & Plan Note (Signed)
Well controlled on metformin.  Will continue medication.

## 2012-06-21 ENCOUNTER — Other Ambulatory Visit: Payer: Self-pay | Admitting: Family Medicine

## 2012-07-06 ENCOUNTER — Ambulatory Visit (INDEPENDENT_AMBULATORY_CARE_PROVIDER_SITE_OTHER): Payer: Medicaid Other | Admitting: Family Medicine

## 2012-07-06 ENCOUNTER — Encounter: Payer: Self-pay | Admitting: Family Medicine

## 2012-07-06 VITALS — BP 126/84 | HR 81 | Temp 97.8°F | Wt 153.0 lb

## 2012-07-06 DIAGNOSIS — R079 Chest pain, unspecified: Secondary | ICD-10-CM

## 2012-07-06 DIAGNOSIS — H9202 Otalgia, left ear: Secondary | ICD-10-CM

## 2012-07-06 DIAGNOSIS — H9209 Otalgia, unspecified ear: Secondary | ICD-10-CM

## 2012-07-06 DIAGNOSIS — R0781 Pleurodynia: Secondary | ICD-10-CM

## 2012-07-06 MED ORDER — CIPROFLOXACIN-HYDROCORTISONE 0.2-1 % OT SUSP: 3.0000 [drp] | Freq: Two times a day (BID) | OTIC | Status: DC

## 2012-07-06 MED ORDER — CIPROFLOXACIN-DEXAMETHASONE 0.3-0.1 % OT SUSP: 4.0000 [drp] | Freq: Two times a day (BID) | OTIC | Status: DC

## 2012-07-06 MED ORDER — MELOXICAM 15 MG PO TABS: 15.0000 mg | ORAL_TABLET | Freq: Every day | ORAL | Status: DC

## 2012-07-06 NOTE — Addendum Note (Signed)
Addended by: Tye Savoy, IVY on: 07/06/2012 04:54 PM   Modules accepted: Orders

## 2012-07-06 NOTE — Progress Notes (Signed)
  Subjective:    Patient ID: [name] Long, female    DOB: Dec 16, 1950, 62 y.o.   MRN: [id number]  HPI  LT ear pain: Has has multiple ear infections as a child.  Now complains of LT ear pain.  Associated with dizziness.  Has tried Tylenol or Motrin for pain, but pain persists.  Described ear fullness, but no decreased hearing.  Denies any ringing of ear.  Pain is located "inside" ear.  Denies swimming frequently, but likes to take baths.  + subjective fevers at home, chills.  No nausea or vomiting. No associated headaches.  Patient concerned she has an ear infection.  Rib pain: Has a hx of chronic rib pain - was told it is due to hx of URI and pneumonia.  LT side rib pain and radiates to low back.  Per patient, she takes Meloxicam for rib pain.  Patient has run out of Meloxicam for a few months and asking for refill.  No decreased sensation.  No paresthesias.  Has had difficulty sleeping in the last few days because it is painful to lay on left side.    Review of Systems  Per HPI    Objective:   Physical Exam  Constitutional: She appears well-nourished. No distress.  HENT:  Right Ear: External ear normal.       LT: cerumen impaction obscuring TM; mild tenderness on palpation of external ear, but no surrounding erythema  Musculoskeletal:       Rib: LT side anterior rib pain on palpation that extends to lower back; no skin changes, erythema, or dermatomal involvement; Full ROM at hips and lumbar and thoracic spine.       Assessment & Plan:

## 2012-07-06 NOTE — Assessment & Plan Note (Signed)
Rib pain is chronic - she thinks it may related to flu shot 2 months ago, but I told her that should have resolved.  Patient prefers [facility] refill today.  She has been on Robaxin and Prednisone in the past, but I told her we would start with [facility] initially.   - Mobic x 2-3 weeks - Follow up in 2 weeks with PCP - Consider referral to Physical Therapy - will defer to PCP - Red flags reviewed

## 2012-07-06 NOTE — Patient Instructions (Addendum)
It was nice to meet you today, [name] Long. For your rib pain, please start taking Meloxicam 15 mg for 14 days. For ear pain, please use Cipro-otic ear drops twice per day for 10 days. Schedule follow up appointment with Dr. Louanne Belton in 2 weeks to recheck ears and rib pain or sooner as needed.

## 2012-07-06 NOTE — Assessment & Plan Note (Signed)
Unable to rule out otitis media, however patient is afebrile and less likely occur in elderly.  Pain may be secondary to otitis externa. - Will give Cipro Otic drops to treat pain - Recommended Debrox ear drops OTC after she completes course of antibiotic drops - Return in 2 weeks so PCP can recheck ears, may need to have wax flushed out at that time - Return to clinic sooner if symptoms worsen

## 2012-07-12 ENCOUNTER — Ambulatory Visit
Admission: RE | Admit: 2012-07-12 | Discharge: 2012-07-12 | Disposition: A | Discharge status: Home / Self Care | Payer: Medicaid Other | Source: Ambulatory Visit | Attending: Cardiovascular Disease | Admitting: Cardiovascular Disease

## 2012-07-12 ENCOUNTER — Other Ambulatory Visit: Payer: Self-pay | Admitting: Cardiovascular Disease

## 2012-07-12 DIAGNOSIS — R05 Cough: Secondary | ICD-10-CM

## 2012-07-12 DIAGNOSIS — R059 Cough, unspecified: Secondary | ICD-10-CM

## 2012-07-13 ENCOUNTER — Other Ambulatory Visit: Payer: Self-pay | Admitting: Family Medicine

## 2012-07-17 ENCOUNTER — Other Ambulatory Visit: Payer: Self-pay | Admitting: Family Medicine

## 2012-07-25 ENCOUNTER — Ambulatory Visit: Payer: Medicaid Other | Admitting: Family Medicine

## 2012-07-25 ENCOUNTER — Other Ambulatory Visit: Payer: Self-pay | Admitting: Family Medicine

## 2012-07-27 ENCOUNTER — Telehealth: Payer: Self-pay | Admitting: Family Medicine

## 2012-07-27 NOTE — Telephone Encounter (Signed)
Called and spoke with patient and advised that tylenol is the only thing OTC she can take. She has been doing this and not helping. Offered work in appointment Monday or today but she has to arrange transportation 4 days in advance. Her daughter gets off at 5:30 and wants to know if she can go to Urgent Care. Advised that she can go to Urgent Care iIf we are not in office or our schedule is full.

## 2012-07-27 NOTE — Telephone Encounter (Signed)
Pt has a bad tooth ache and she has no transportation today and can't get in with dentist until next week - wants to know what she can take OTC to help her with the pain.

## 2012-08-14 ENCOUNTER — Encounter: Payer: Self-pay | Admitting: Family Medicine

## 2012-08-14 ENCOUNTER — Ambulatory Visit (INDEPENDENT_AMBULATORY_CARE_PROVIDER_SITE_OTHER): Payer: Medicaid Other | Admitting: Family Medicine

## 2012-08-14 VITALS — BP 134/84 | HR 84 | Temp 98.2°F | Ht 64.0 in | Wt 155.0 lb

## 2012-08-14 DIAGNOSIS — E7800 Pure hypercholesterolemia, unspecified: Secondary | ICD-10-CM

## 2012-08-14 DIAGNOSIS — R079 Chest pain, unspecified: Secondary | ICD-10-CM

## 2012-08-14 DIAGNOSIS — M159 Polyosteoarthritis, unspecified: Secondary | ICD-10-CM

## 2012-08-14 DIAGNOSIS — E039 Hypothyroidism, unspecified: Secondary | ICD-10-CM

## 2012-08-14 DIAGNOSIS — E119 Type 2 diabetes mellitus without complications: Secondary | ICD-10-CM

## 2012-08-14 DIAGNOSIS — I10 Essential (primary) hypertension: Secondary | ICD-10-CM

## 2012-08-14 LAB — LDL CHOLESTEROL, DIRECT: Direct LDL: 105 mg/dL — ABNORMAL HIGH

## 2012-08-14 LAB — BASIC METABOLIC PANEL
BUN: 21 mg/dL (ref 6–23)
CO2: 29 mEq/L (ref 19–32)
Calcium: 9.2 mg/dL (ref 8.4–10.5)
Chloride: 100 mEq/L (ref 96–112)
Creat: 0.98 mg/dL (ref 0.50–1.10)
Glucose, Bld: 92 mg/dL (ref 70–99)
Potassium: 3.5 mEq/L (ref 3.5–5.3)
Sodium: 139 mEq/L (ref 135–145)

## 2012-08-14 LAB — POCT GLYCOSYLATED HEMOGLOBIN (HGB A1C): Hemoglobin A1C: 5.9

## 2012-08-14 LAB — TSH: TSH: 0.289 u[IU]/mL — ABNORMAL LOW (ref 0.350–4.500)

## 2012-08-14 MED ORDER — AMLODIPINE BESYLATE 2.5 MG PO TABS: 2.5000 mg | ORAL_TABLET | Freq: Every day | ORAL | Status: DC

## 2012-08-14 NOTE — Patient Instructions (Signed)
It was great to see you today! Please let your heart doctor know that I would appreciate a copy of his tests once he is done looking at your heart. You can start taking 2.5mg  of Norvasc daily.  Stop if your blood pressure goes too low. I have put in the referral for you to see Dr. Pecolia Ades. Plan to come back and see me in 2-3 months.

## 2012-08-14 NOTE — Assessment & Plan Note (Signed)
Recheck cholesterol today. Patient does report that she has had many poor dietary choices for the last several months but that this will be changing in your future. Accordingly I do not plan on making any changes in her medications based on these results at this time unless they are significantly elevated.

## 2012-08-14 NOTE — Assessment & Plan Note (Signed)
I offered the patient cortisone shots today, however she preferred to go to her orthopedist. She has a long relationship with him I feel this is reasonable we'll place a referral.

## 2012-08-14 NOTE — Assessment & Plan Note (Signed)
I will attempt to obtain records once her cardiology workup is complete. If she does indeed have angina, and would appear to be relatively stable.

## 2012-08-14 NOTE — Progress Notes (Signed)
Patient ID: [name] Long, female   DOB: 12-20-1950, 62 y.o.   MRN: [id number] Subjective: The patient is a 62 y.o. year old female who presents today for multiple issues.  1. Abnormal stress test: Patient reports that she has been under a lot of stress recently and has been having some problems with chest pain. She has seen a cardiologist recently who felt the chest pain was consistent with angina. Her stress test, which her cardiologist performed recently, was reportedly abnormal and she'll be following up with him for further testing. She is not currently having any chest pain and reports that the pain is only present when she is under a lot of emotional stress.  2. Osteoarthritis of the knees: Patient has been seen by orthopedics for the last several years for the osteoarthritis of her knees. She has previously received hyluronic acid injections which helped for between 8 months of the year. She requires authorization to continue seeing them.  3. Blood pressure: Patient reports that her blood pressure is running a little bit on the high side recently. Her home blood pressure monitoring has had lots of readings in the 140s to 160s. She is very worried about this and wants to restart Norvasc.  Patient's past medical, social, and family history were reviewed and updated as appropriate. History  Substance Use Topics  . Smoking status: Current Every Day Smoker -- 0.50 packs/day for 39 years    Types: Cigarettes  . Smokeless tobacco: Not on file  . Alcohol Use: Yes   Objective:  Filed Vitals:   08/14/12 1340  BP: 134/84  Pulse: 84  Temp: 98.2 F (36.8 C)   Gen: No acute distress, overweight CV: Regular rate and rhythm, no murmurs appreciated Resp: Clear to auscultation bilaterally Ext: No edema  Assessment/Plan:  Please also see individual problems in problem list for problem-specific plans.

## 2012-08-14 NOTE — Assessment & Plan Note (Signed)
The patient is not really need additional blood pressure medication, however, she is very anxious about this. I will start very low dose Norvasc with instructions to discontinue if her blood pressure routinely drops below 110 systolic.

## 2012-08-22 ENCOUNTER — Other Ambulatory Visit: Payer: Self-pay | Admitting: Family Medicine

## 2012-08-22 ENCOUNTER — Other Ambulatory Visit: Payer: Self-pay | Admitting: Internal Medicine

## 2012-08-22 DIAGNOSIS — Z1231 Encounter for screening mammogram for malignant neoplasm of breast: Secondary | ICD-10-CM

## 2012-09-05 ENCOUNTER — Ambulatory Visit
Admission: RE | Admit: 2012-09-05 | Discharge: 2012-09-05 | Disposition: A | Discharge status: Home / Self Care | Payer: Medicaid Other | Source: Ambulatory Visit | Attending: Family Medicine | Admitting: Family Medicine

## 2012-09-05 DIAGNOSIS — Z1231 Encounter for screening mammogram for malignant neoplasm of breast: Secondary | ICD-10-CM

## 2012-09-11 ENCOUNTER — Other Ambulatory Visit: Payer: Self-pay | Admitting: Family Medicine

## 2012-09-13 ENCOUNTER — Ambulatory Visit (INDEPENDENT_AMBULATORY_CARE_PROVIDER_SITE_OTHER): Payer: Medicaid Other | Admitting: Family Medicine

## 2012-09-13 ENCOUNTER — Encounter: Payer: Self-pay | Admitting: Family Medicine

## 2012-09-13 ENCOUNTER — Other Ambulatory Visit (HOSPITAL_COMMUNITY)
Admission: RE | Admit: 2012-09-13 | Discharge: 2012-09-13 | Disposition: A | Discharge status: Home / Self Care | Payer: Medicaid Other | Source: Ambulatory Visit | Attending: Family Medicine | Admitting: Family Medicine

## 2012-09-13 VITALS — BP 125/85 | HR 96 | Temp 98.3°F | Ht 64.0 in | Wt 154.8 lb

## 2012-09-13 DIAGNOSIS — N760 Acute vaginitis: Secondary | ICD-10-CM

## 2012-09-13 DIAGNOSIS — R0789 Other chest pain: Secondary | ICD-10-CM

## 2012-09-13 DIAGNOSIS — Z20828 Contact with and (suspected) exposure to other viral communicable diseases: Secondary | ICD-10-CM

## 2012-09-13 DIAGNOSIS — J329 Chronic sinusitis, unspecified: Secondary | ICD-10-CM

## 2012-09-13 DIAGNOSIS — R079 Chest pain, unspecified: Secondary | ICD-10-CM

## 2012-09-13 DIAGNOSIS — Z113 Encounter for screening for infections with a predominantly sexual mode of transmission: Secondary | ICD-10-CM | POA: Insufficient documentation

## 2012-09-13 DIAGNOSIS — R0781 Pleurodynia: Secondary | ICD-10-CM

## 2012-09-13 LAB — POCT WET PREP (WET MOUNT): Clue Cells Wet Prep Whiff POC: NEGATIVE

## 2012-09-13 MED ORDER — MELOXICAM 15 MG PO TABS: 15.0000 mg | ORAL_TABLET | Freq: Every day | ORAL | Status: AC

## 2012-09-13 MED ORDER — AMOXICILLIN-POT CLAVULANATE 875-125 MG PO TABS: 1.0000 | ORAL_TABLET | Freq: Two times a day (BID) | ORAL | Status: DC

## 2012-09-13 NOTE — Patient Instructions (Signed)
Take the antibiotic 2 times per day for the next 10 days I have sent in a new prescription for your rib medication. Someone will be in touch with you about the results of your tests from today.

## 2012-09-14 LAB — HIV ANTIBODY (ROUTINE TESTING W REFLEX): HIV: NONREACTIVE

## 2012-09-17 ENCOUNTER — Telehealth: Payer: Self-pay | Admitting: Family Medicine

## 2012-09-17 NOTE — Telephone Encounter (Signed)
Informed pt,she voiced understanding. Proposito, [name] Long

## 2012-09-17 NOTE — Telephone Encounter (Signed)
Could you please let [name] Long know she does not have any stds.  Thanks!

## 2012-09-18 ENCOUNTER — Telehealth: Payer: Self-pay | Admitting: Family Medicine

## 2012-09-18 NOTE — Telephone Encounter (Signed)
Patient forgot her transportation slip on 3/13 and is asking for the nurse to do this.  It seems like she has forgotten to bring her slips a lot.

## 2012-09-19 ENCOUNTER — Encounter: Payer: Self-pay | Admitting: *Deleted

## 2012-09-19 NOTE — Telephone Encounter (Signed)
Letter stating that patient had an  office visit on march 13,2014,was faxed to [facility] phone# [phone] fax# [phone]

## 2012-10-02 NOTE — Assessment & Plan Note (Signed)
Likely related to chronic cough from smoking.  Discussed with patient.  She is not interested in quitting.  Refill meloxicam.

## 2012-10-02 NOTE — Progress Notes (Signed)
Patient ID: [name] Long, female   DOB: 1951/01/19, 62 y.o.   MRN: [id number] Subjective: The patient is a 62 y.o. year old female who presents today for f/u.  1. Sinus pressure: Present now on and off for about 2 months.  Continues to have facial pressure/pain, drainage, malodorous smell.  No fevers/chills/visual changes.  2. Vaginal discharge: Continues to be sexually active with female whose fidelity she is not certain of.  She has discharge but no itching.  Has been going on now for several weeks.  Discharge is minimal and white.  No odor.  No itching. No abd pain, no fevers.  No pain with intercourse.  3. Rib pain: Left sided, constant complaint.  Helped by meloxicam.  Is out of this medication.  Reports continued cough.  Patient's past medical, social, and family history were reviewed and updated as appropriate. History  Substance Use Topics  . Smoking status: Current Every Day Smoker -- 0.50 packs/day for 39 years    Types: Cigarettes  . Smokeless tobacco: Not on file  . Alcohol Use: Yes   Objective:  Filed Vitals:   09/13/12 1407  BP: 125/85  Pulse: 96  Temp: 98.3 F (36.8 C)   Gen: NAD, overweight HEENT: Mild facial tenderness, pharyngeal cobblestoning CV: RRR Resp: CTABL Abd: SNTND Chest: Some pain on palpation of the 8-10th ribs on the left  Assessment/Plan: Vaginal discharge: Will obtain urine gc/chl and hiv/rpr.  This is a repetitive complaint with this patient and feel the likelihood of infection is low at this time.  Sinus infection: Will tx with abx as has a history that is >2 weeks.  If recurrs, will add flonase.  Please also see individual problems in problem list for problem-specific plans.

## 2012-11-05 ENCOUNTER — Other Ambulatory Visit: Payer: Self-pay | Admitting: Family Medicine

## 2012-12-15 ENCOUNTER — Other Ambulatory Visit: Payer: Self-pay | Admitting: Family Medicine

## 2012-12-18 ENCOUNTER — Other Ambulatory Visit: Payer: Self-pay | Admitting: Family Medicine

## 2012-12-19 ENCOUNTER — Other Ambulatory Visit: Payer: Self-pay | Admitting: *Deleted

## 2012-12-19 MED ORDER — HYDROCHLOROTHIAZIDE 25 MG PO TABS: ORAL_TABLET | ORAL | Status: DC

## 2012-12-19 MED ORDER — GLUCOSE BLOOD VI STRP: ORAL_STRIP | Status: DC

## 2013-01-01 ENCOUNTER — Ambulatory Visit: Payer: Medicaid Other | Admitting: Sports Medicine

## 2013-01-08 ENCOUNTER — Telehealth: Payer: Self-pay | Admitting: Family Medicine

## 2013-01-08 NOTE — Telephone Encounter (Signed)
Patient due to come in tomorrow for visit will address then

## 2013-01-08 NOTE — Telephone Encounter (Signed)
The patient needs an order for a shower chair faxed to [facility].

## 2013-01-09 ENCOUNTER — Ambulatory Visit (INDEPENDENT_AMBULATORY_CARE_PROVIDER_SITE_OTHER): Payer: Medicaid Other | Admitting: Sports Medicine

## 2013-01-09 ENCOUNTER — Other Ambulatory Visit (HOSPITAL_COMMUNITY)
Admission: RE | Admit: 2013-01-09 | Discharge: 2013-01-09 | Disposition: A | Discharge status: Home / Self Care | Payer: Medicaid Other | Source: Ambulatory Visit | Attending: Sports Medicine | Admitting: Sports Medicine

## 2013-01-09 ENCOUNTER — Encounter: Payer: Self-pay | Admitting: Sports Medicine

## 2013-01-09 VITALS — BP 138/69 | HR 102 | Temp 99.0°F | Ht 64.0 in | Wt 154.7 lb

## 2013-01-09 DIAGNOSIS — F17200 Nicotine dependence, unspecified, uncomplicated: Secondary | ICD-10-CM

## 2013-01-09 DIAGNOSIS — S3994XA Unspecified injury of external genitals, initial encounter: Secondary | ICD-10-CM

## 2013-01-09 DIAGNOSIS — N760 Acute vaginitis: Secondary | ICD-10-CM

## 2013-01-09 DIAGNOSIS — S3993XA Unspecified injury of pelvis, initial encounter: Secondary | ICD-10-CM

## 2013-01-09 DIAGNOSIS — Z1151 Encounter for screening for human papillomavirus (HPV): Secondary | ICD-10-CM | POA: Insufficient documentation

## 2013-01-09 DIAGNOSIS — S39848A Other specified injuries of external genitals, initial encounter: Secondary | ICD-10-CM

## 2013-01-09 DIAGNOSIS — Z01419 Encounter for gynecological examination (general) (routine) without abnormal findings: Secondary | ICD-10-CM | POA: Insufficient documentation

## 2013-01-09 DIAGNOSIS — Z124 Encounter for screening for malignant neoplasm of cervix: Secondary | ICD-10-CM

## 2013-01-09 NOTE — Progress Notes (Signed)
  [facility]  Patient name: [name] Long MRN [id number]  Date of birth: [birth year]  CC & HPI:  [name] Long is a 63 y.o. female presenting today for vaginal discharge:  # Acute PELVIC Symptoms in a Female Symptoms (If [place] has not been addressed)  Major Sxs:  discharge and odor and generalized vaginal discomfort  Character   Thin clear discharge  Onset  present since roughed intercourse approximately one month ago.  Intercourse was reportedly painful   Discharge  Yes as above   Odor  Yes, foul and constantly present   Rash  No  Urinary Symptoms  no dysuria or frequency  Fevers, Chills, Rigors  No  LMP  No LMP recorded. Patient is postmenopausal.   Sexually Activity  Yes but abstinent since above   Pregnant  No  ?STI Exposure  Unknown  Prior infections?  No  Recent ABX?  No  Therapy Tried  Nothing     ROS:  Per HPI  Pertinent History Reviewed:  Medical & Surgical Hx:  Reviewed: Significant for COPD, Bipolar disorder, DM, Hypothyroid Medications: Reviewed & Updated - see associated section Social History: Reviewed -  reports that she has been smoking Cigarettes.  She has a 19.5 pack-year smoking history. She does not have any smokeless tobacco history on file. not ensuring quitting  Objective Findings:  Vitals: BP 138/69  Pulse 102  Temp(Src) 99 F (37.2 C) (Oral)  Ht 5\' 4"  (1.626 m)  Wt 154 lb 11.2 oz (70.171 kg)  BMI 26.54 kg/m2  PE: GENERAL:  adult [place] American female. In no discomfort; no respiratory distress  PSYCH:  mood and affect is appropriate   HNEENT:    CARDIO:    LUNGS:    ABDOMEN:  +BS, soft, non-tender, no rigidity, no guarding, no masses/hepatosplenomegaly  GU: Chaperone: CMA  External: no skin lesions, normal appearing genitalia, thin white minimal discharge  Vagina:  to significant vaginal tears one that is approximately 2 cm horizontal position on the superior aspect of the vagina approximately 2 cm from the  introitus.  There is an additional small half centimeter vertical tear distal to the larger horizontal tear.  A second tear is difficult to visualize.  The first large horizontal tear does appear to be full mucosal thickness with the vaginal muscle exposed.  There is minimal erythema.    Cervix:  cervical lesion present at approximately the 11:00 position.  Pap smear obtained today.  Uterus:  deferred bimanual exam after seeing the vaginal tear.    Adenexa:   TESTS:      SKIN:   NEUROMSK:       Assessment & Plan:

## 2013-01-10 ENCOUNTER — Other Ambulatory Visit: Payer: Self-pay

## 2013-01-10 ENCOUNTER — Telehealth: Payer: Self-pay | Admitting: Family Medicine

## 2013-01-10 DIAGNOSIS — S3993XA Unspecified injury of pelvis, initial encounter: Secondary | ICD-10-CM | POA: Insufficient documentation

## 2013-01-10 NOTE — Telephone Encounter (Signed)
Patient is calling to find out when her appointment at [facility] for her [facility] problems is going to be.  She thought she would hear something today.

## 2013-01-11 NOTE — Assessment & Plan Note (Addendum)
2 impressive lacerations of superior vaginal wall. Likely associated with rough intercourse given symptoms have been persistent since that time. Other considerations include VIN. Given mucosa breakdown and vaginal muscles exposed will urgently refer to OB for further evaluation and likely biopsy. Discussed with Dr. Despina Hidden on the telephone and he felt that is appropriate for her to be able to wait until the beginning of next week. She has no evidence of viscous perforation and her abdomen is benign.  She is afebrile and denying any systemic symptoms. > Consider biopsy prior to repair if indicated

## 2013-01-11 NOTE — Assessment & Plan Note (Signed)
Encouraged to quit. Likely prohibiting some healing aspect

## 2013-01-11 NOTE — Assessment & Plan Note (Signed)
Pap smear collected as she was due for this and questionable cervical lesion, unfortunately order was entered incorrectly and she will not have routine cone testing performed. Wet prep was also obtained but was not able to be inspected prior to specimen being destroyed > Repeat wet prep at [facility]

## 2013-01-14 ENCOUNTER — Telehealth: Payer: Self-pay | Admitting: Family Medicine

## 2013-01-14 ENCOUNTER — Encounter: Payer: Self-pay | Admitting: Sports Medicine

## 2013-01-14 NOTE — Telephone Encounter (Signed)
Pt is requesting that Dr. Aviva Signs to call her and explain the test results of her examination. She still has the odor and discharge. She has a appointment at the [facility] in August but is still concerned about her diagnoses. JW

## 2013-01-14 NOTE — Telephone Encounter (Signed)
I have been trying to get in touch with [facility] but they are already closed today. She will need to be seen this week. Her PAP test was normal.  See associated visit note. I have updated the patient and inform her the status of her referall. We will need to follow up on this and get her scheduled ASAP.  Andrena Mews, DO [facility] Resident - PGY-2 01/14/2013 4:16 PM

## 2013-01-15 ENCOUNTER — Encounter: Payer: Self-pay | Admitting: Obstetrics and Gynecology

## 2013-01-15 NOTE — Telephone Encounter (Signed)
Tomorrow appointment scheduled at 345PM. Uses [employer] Transportation - [phone] - I have called and arranged transportation to ensure availability. - [employer] Transportation to pick pt up @ 225.   - Pt needs to call from clinic when she is ready to go home  Please call pt and inform of pickup time.  Andrena Mews, DO [facility] Resident - PGY-2 01/15/2013 9:51 AM

## 2013-01-15 NOTE — Telephone Encounter (Signed)
Please see encounter from 7/14

## 2013-01-15 NOTE — Telephone Encounter (Signed)
Pt notified of MD message below.   [place], Kristen L, CMA  

## 2013-01-16 ENCOUNTER — Inpatient Hospital Stay (HOSPITAL_COMMUNITY): Admission: RE | Admit: 2013-01-16 | Payer: Self-pay | Source: Ambulatory Visit

## 2013-01-16 ENCOUNTER — Ambulatory Visit (INDEPENDENT_AMBULATORY_CARE_PROVIDER_SITE_OTHER): Payer: Medicaid Other | Admitting: Obstetrics & Gynecology

## 2013-01-16 ENCOUNTER — Encounter: Payer: Self-pay | Admitting: Obstetrics & Gynecology

## 2013-01-16 ENCOUNTER — Other Ambulatory Visit (HOSPITAL_COMMUNITY)
Admission: RE | Admit: 2013-01-16 | Discharge: 2013-01-16 | Disposition: A | Discharge status: Home / Self Care | Payer: Medicaid Other | Source: Ambulatory Visit | Attending: Obstetrics & Gynecology | Admitting: Obstetrics & Gynecology

## 2013-01-16 VITALS — BP 132/89 | HR 69 | Temp 97.7°F | Ht 63.0 in | Wt 155.2 lb

## 2013-01-16 DIAGNOSIS — N898 Other specified noninflammatory disorders of vagina: Secondary | ICD-10-CM

## 2013-01-16 NOTE — Addendum Note (Signed)
Addended by: Franchot Mimes on: 01/16/2013 04:40 PM   Modules accepted: Orders

## 2013-01-16 NOTE — Progress Notes (Signed)
  Subjective:    Patient ID: [name] Long, female    DOB: January 08, 1951, 62 y.o.   MRN: [id number]  HPI  This 62 yo menopausal AA lady is referred here by Dr. Berline Chough for evaluation of a unusual finding of what looked like vaginal wall lacerations. She has the complaint of a vaginal odor.  Her last IC was 1/14 and was described as "rough" but no fomites.   Review of Systems Pap smear recently normal     Objective:   Physical Exam  The anterior vaginal wall about 2 cm away from the cervicovaginal junction shows to lacertion-type lesions. The one on her left appears to have granulation tissue in the middle. I biopsied this with a Kevorkian biopsy tool.      Assessment & Plan:  Vaginal laceration/lesion I will await the biopsy.  RTC 1 month for re check I sent a wet prep to evaluate her vaginal odor (no odor noted with vaginal exam).

## 2013-01-17 LAB — WET PREP, GENITAL
Trich, Wet Prep: NONE SEEN
Yeast Wet Prep HPF POC: NONE SEEN

## 2013-01-30 ENCOUNTER — Encounter: Payer: Self-pay | Admitting: Family Medicine

## 2013-01-30 ENCOUNTER — Ambulatory Visit (INDEPENDENT_AMBULATORY_CARE_PROVIDER_SITE_OTHER): Payer: Medicaid Other | Admitting: Family Medicine

## 2013-01-30 VITALS — BP 124/82 | HR 91 | Temp 98.8°F | Ht 64.0 in | Wt 153.1 lb

## 2013-01-30 DIAGNOSIS — N058 Unspecified nephritic syndrome with other morphologic changes: Secondary | ICD-10-CM

## 2013-01-30 DIAGNOSIS — F319 Bipolar disorder, unspecified: Secondary | ICD-10-CM

## 2013-01-30 DIAGNOSIS — E1129 Type 2 diabetes mellitus with other diabetic kidney complication: Secondary | ICD-10-CM

## 2013-01-30 LAB — COMPREHENSIVE METABOLIC PANEL
ALT: 9 U/L (ref 0–35)
AST: 13 U/L (ref 0–37)
Albumin: 4 g/dL (ref 3.5–5.2)
Alkaline Phosphatase: 63 U/L (ref 39–117)
BUN: 13 mg/dL (ref 6–23)
CO2: 26 mEq/L (ref 19–32)
Calcium: 9.2 mg/dL (ref 8.4–10.5)
Chloride: 102 mEq/L (ref 96–112)
Creat: 1.04 mg/dL (ref 0.50–1.10)
Glucose, Bld: 74 mg/dL (ref 70–99)
Potassium: 3.7 mEq/L (ref 3.5–5.3)
Sodium: 137 mEq/L (ref 135–145)
Total Bilirubin: 0.3 mg/dL (ref 0.3–1.2)
Total Protein: 7.2 g/dL (ref 6.0–8.3)

## 2013-01-30 LAB — CBC
HCT: 41 % (ref 36.0–46.0)
Hemoglobin: 14.1 g/dL (ref 12.0–15.0)
MCH: 35.6 pg — ABNORMAL HIGH (ref 26.0–34.0)
MCHC: 34.4 g/dL (ref 30.0–36.0)
MCV: 103.5 fL — ABNORMAL HIGH (ref 78.0–100.0)
Platelets: 210 10*3/uL (ref 150–400)
RBC: 3.96 MIL/uL (ref 3.87–5.11)
RDW: 15.5 % (ref 11.5–15.5)
WBC: 7.2 10*3/uL (ref 4.0–10.5)

## 2013-01-30 LAB — POCT GLYCOSYLATED HEMOGLOBIN (HGB A1C): Hemoglobin A1C: 6.1

## 2013-01-30 NOTE — Patient Instructions (Addendum)
Your Diabetes seems well controlled. No changes to your medication regimen for now. Please make an appointment for follow up.

## 2013-01-30 NOTE — Progress Notes (Signed)
Family Medicine Office Visit Note   Subjective:   Patient ID: [name] Long, female  DOB: 05-26-1951, 62 y.o.. MRN: [id number]   Pt that comes today to f/u DM She is on Metformin and reports compliance. She denies symptoms of hypo/hyperglycemia. We went through her medication and reviewed/update them.   She has other complaints but re fusses to talk with female providers.  Pt f/u her Bipolar Disorder with [facility] who recently recommended Depakote level. Pt requests to be done here and sent there since she has issues with transportation. She will call us with name and fax information to be send attn to.  Review of Systems:  Pt denies SOB, chest pain, palpitations, headaches, dizziness, numbness or weakness. No changes on urinary or BM habits. No unintentional weigh loss/gain.  Objective:   Physical Exam: Gen:  NAD HEENT: Moist mucous membranes  CV: Regular rate and rhythm, no murmurs rubs or gallops PULM: Clear to auscultation bilaterally. No wheezes/rales/rhonchi ABD: Soft, non tender, non distended, normal bowel sounds. EXT: No edema Neuro: Alert and oriented x3. No focalization  Assessment & Plan:

## 2013-01-30 NOTE — Assessment & Plan Note (Addendum)
A1C at goal. No changes on her current regimen. 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 We dont have recent GFR. Last Cr was WNL.  P/ CMET( to evlaute LFT's pt on depakote), CBC and Depakote level.

## 2013-01-31 LAB — VALPROIC ACID LEVEL: Valproic Acid Lvl: 41.3 ug/mL — ABNORMAL LOW (ref 50.0–100.0)

## 2013-02-14 ENCOUNTER — Other Ambulatory Visit: Payer: Self-pay | Admitting: Family Medicine

## 2013-02-15 ENCOUNTER — Encounter: Payer: Medicaid Other | Admitting: Obstetrics and Gynecology

## 2013-02-26 ENCOUNTER — Telehealth: Payer: Self-pay | Admitting: Family Medicine

## 2013-02-26 NOTE — Telephone Encounter (Signed)
Patient need a referral to [facility] to get her cortisone shot. Office phone # is [phone]. Patient's appt is 9/2 @ 3:30pm.

## 2013-03-05 ENCOUNTER — Encounter: Payer: Self-pay | Admitting: Family Medicine

## 2013-03-05 ENCOUNTER — Ambulatory Visit (INDEPENDENT_AMBULATORY_CARE_PROVIDER_SITE_OTHER): Payer: Medicaid Other | Admitting: Family Medicine

## 2013-03-05 VITALS — BP 127/79 | HR 90 | Temp 98.2°F | Ht 64.0 in | Wt 154.2 lb

## 2013-03-05 DIAGNOSIS — IMO0002 Reserved for concepts with insufficient information to code with codable children: Secondary | ICD-10-CM

## 2013-03-05 DIAGNOSIS — M1710 Unilateral primary osteoarthritis, unspecified knee: Secondary | ICD-10-CM

## 2013-03-05 DIAGNOSIS — M179 Osteoarthritis of knee, unspecified: Secondary | ICD-10-CM

## 2013-03-05 DIAGNOSIS — E785 Hyperlipidemia, unspecified: Secondary | ICD-10-CM

## 2013-03-05 MED ORDER — PRAVASTATIN SODIUM 40 MG PO TABS: ORAL_TABLET | ORAL | Status: DC

## 2013-03-05 MED ORDER — METHOCARBAMOL 500 MG PO TABS: 500.0000 mg | ORAL_TABLET | Freq: Three times a day (TID) | ORAL | Status: DC | PRN

## 2013-03-05 NOTE — Patient Instructions (Addendum)
Your vaginal lesion have resolved. The biopsy taken at [facility] showed inflammation but not malignancy.  Please let them know you came here for follow up,since you had an appointment scheduled with them in August 15th. I also have placed the referral for Orthopedic as well. Follow up with [place] in 3 month or sooner if needed.

## 2013-03-07 NOTE — Progress Notes (Signed)
Family Medicine Office Visit Note   Subjective:   Patient ID: [name] Long, female  DOB: 09-Aug-1950, 62 y.o.. MRN: [id number]   Pt that comes today to f/u her recent vaginal laceration/biopsy. She did not f/u at [facility] as recommended. Pt denies any vaginal discharge, bleeding or other complaints today.  She follows up with [facility] for gel shots on her knees and is requesting referral stating that her insurance will not pay if she does not have a referral from her primary doctor. Pt has her f/u appointment with them already scheduled for today in the afternoon.  Review of Systems:  Per HPI  Objective:   Physical Exam: Gen:  NAD Vulva and perianal area: Normal  Speculum: Vagina and cervix of normal appearance, mild small mucosal lesion on anterior portion of vagina, no friability, no discharge. Bimanual exam: Uterus anteverted, no adnexal masses. No cervical motion tenderness.  Assessment & Plan:

## 2013-03-07 NOTE — Assessment & Plan Note (Signed)
Laceration completely resolved. Biopsy pathology results are Vagina, biopsy, anterior wall - BENIGN SQUAMOUS MUCOSA WITH INFLAMED GRANULATION TISSUE. - NO DYSPLASIA OR MALIGNANCY.: P/ Follow up in 3 months.

## 2013-03-11 ENCOUNTER — Other Ambulatory Visit: Payer: Self-pay | Admitting: Family Medicine

## 2013-03-15 ENCOUNTER — Other Ambulatory Visit: Payer: Self-pay | Admitting: Family Medicine

## 2013-03-15 NOTE — Telephone Encounter (Signed)
Pt called because the pharmacy needs doctors approval for metronidazole to be refilled. JW

## 2013-03-27 ENCOUNTER — Telehealth: Payer: Self-pay | Admitting: Family Medicine

## 2013-03-27 NOTE — Telephone Encounter (Signed)
Ms [name] Long with [facility]  [phone]: Called to request shower chair. She had requested it on 01-08-13.Pt came in the next day to discuss this but it was never discussed. Fax number for [facility]   [phone] Please advise  Please call Ms [name] Long when this is faxed

## 2013-04-08 ENCOUNTER — Other Ambulatory Visit: Payer: Self-pay | Admitting: Family Medicine

## 2013-04-10 ENCOUNTER — Ambulatory Visit: Payer: Medicaid Other | Admitting: Family Medicine

## 2013-04-11 NOTE — Telephone Encounter (Signed)
Prescription for shower chair placed to be faxed.

## 2013-04-11 NOTE — Telephone Encounter (Signed)
Spoke with patient and informed her of below 

## 2013-04-16 ENCOUNTER — Ambulatory Visit (INDEPENDENT_AMBULATORY_CARE_PROVIDER_SITE_OTHER): Payer: Medicaid Other | Admitting: Family Medicine

## 2013-04-16 ENCOUNTER — Encounter: Payer: Self-pay | Admitting: Family Medicine

## 2013-04-16 VITALS — BP 127/87 | HR 88 | Temp 98.5°F | Ht 64.0 in | Wt 153.0 lb

## 2013-04-16 DIAGNOSIS — N058 Unspecified nephritic syndrome with other morphologic changes: Secondary | ICD-10-CM

## 2013-04-16 DIAGNOSIS — E1129 Type 2 diabetes mellitus with other diabetic kidney complication: Secondary | ICD-10-CM

## 2013-04-16 DIAGNOSIS — N760 Acute vaginitis: Secondary | ICD-10-CM

## 2013-04-16 DIAGNOSIS — Z23 Encounter for immunization: Secondary | ICD-10-CM

## 2013-04-16 DIAGNOSIS — N898 Other specified noninflammatory disorders of vagina: Secondary | ICD-10-CM

## 2013-04-16 LAB — POCT GLYCOSYLATED HEMOGLOBIN (HGB A1C): Hemoglobin A1C: 6

## 2013-04-16 LAB — POCT WET PREP (WET MOUNT)
Clue Cells Wet Prep Whiff POC: POSITIVE
WBC, Wet Prep HPF POC: NEGATIVE

## 2013-04-16 NOTE — Assessment & Plan Note (Signed)
Wet prep obtained, we'll await results to communicate patient and tailor appropriate treatment.

## 2013-04-16 NOTE — Patient Instructions (Addendum)
I will call you with the labs results if they come back abnormal otherwise I will mail you the results. Please make an appointment in 2-3 months in order to check your thyroid and your diabetes.

## 2013-04-16 NOTE — Progress Notes (Signed)
Family Medicine Office Visit Note   Subjective:   Patient ID: [name] Long, female  DOB: [birth year], 62 y.o.. MRN: [id number]   Pt that comes today complaining of vaginal discharge. She reports having a new relationship and has had unprotected sexual intercourse. She has not is in order and a vaginal discharge for about a month. Denies pelvic/abdominal pain, nausea, fever, chills, or other systemic symptoms.  She was also concerned for pregnancy. She reports has not had periods since she was 62 years old. She reports after her last pregnancy she was on Depo-Provera for 10 years and when she discontinued it she never saw her periods back.  Review of Systems:  Per HPI  Objective:   Physical Exam: Gen:  NAD, oriented x3.  HEENT: Moist mucous membranes  CV: Regular rate and rhythm PULM: Clear to auscultation bilaterally.  ABD: Soft, non tender, non distended, normal bowel sounds. EXT: No edema Vulva and perianal area: Normal  Speculum: Vagina and cervix of normal appearance, no friability, white curd-like discharge. Bimanual exam: Uterus anteverted, no adnexal masses. No cervical motion tenderness.  Assessment & Plan:

## 2013-04-17 ENCOUNTER — Telehealth: Payer: Self-pay | Admitting: Family Medicine

## 2013-04-17 DIAGNOSIS — N760 Acute vaginitis: Secondary | ICD-10-CM

## 2013-04-17 DIAGNOSIS — B9689 Other specified bacterial agents as the cause of diseases classified elsewhere: Secondary | ICD-10-CM

## 2013-04-17 MED ORDER — METRONIDAZOLE 500 MG PO TABS: 500.0000 mg | ORAL_TABLET | Freq: Two times a day (BID) | ORAL | Status: DC

## 2013-04-17 NOTE — Telephone Encounter (Signed)
Dr. Aviva Signs has spoken with patient

## 2013-04-17 NOTE — Telephone Encounter (Signed)
Pt called because she blew her nose and green mucus along with blood was in her nose. She wanted to know if there is anything that the doctor can call in. She also wanted to know if she needs to be seen? She was here on 10/14. JW

## 2013-04-17 NOTE — Telephone Encounter (Signed)
Called pt and informed results from wet prep. Metronidazole send to pharmacy. Follow up as needed.

## 2013-05-08 ENCOUNTER — Ambulatory Visit
Admission: RE | Admit: 2013-05-08 | Discharge: 2013-05-08 | Disposition: A | Discharge status: Home / Self Care | Payer: Medicaid Other | Source: Ambulatory Visit | Attending: Cardiovascular Disease | Admitting: Cardiovascular Disease

## 2013-05-08 ENCOUNTER — Other Ambulatory Visit: Payer: Self-pay | Admitting: Cardiovascular Disease

## 2013-05-08 DIAGNOSIS — R059 Cough, unspecified: Secondary | ICD-10-CM

## 2013-05-08 DIAGNOSIS — R05 Cough: Secondary | ICD-10-CM

## 2013-05-08 DIAGNOSIS — J449 Chronic obstructive pulmonary disease, unspecified: Secondary | ICD-10-CM

## 2013-05-10 ENCOUNTER — Telehealth: Payer: Self-pay | Admitting: Family Medicine

## 2013-05-10 NOTE — Telephone Encounter (Signed)
Pt called and needs refills on her Norvasc and albuterol called in. JW

## 2013-05-13 ENCOUNTER — Other Ambulatory Visit: Payer: Self-pay | Admitting: Family Medicine

## 2013-05-13 DIAGNOSIS — J449 Chronic obstructive pulmonary disease, unspecified: Secondary | ICD-10-CM

## 2013-05-13 MED ORDER — AMLODIPINE BESYLATE 2.5 MG PO TABS: ORAL_TABLET | ORAL | Status: DC

## 2013-05-13 MED ORDER — ALBUTEROL SULFATE HFA 108 (90 BASE) MCG/ACT IN AERS: 2.0000 | INHALATION_SPRAY | RESPIRATORY_TRACT | Status: DC | PRN

## 2013-05-13 NOTE — Telephone Encounter (Signed)
Pt called to check the status of her medications refills and also the status shower chair. She would like someone to call her ASAP because she been waiting on the refills since last week and the shower chair sine July. JW

## 2013-05-13 NOTE — Telephone Encounter (Signed)
Norvasc and Albuterol has been refilled.

## 2013-05-14 NOTE — Telephone Encounter (Signed)
Related message,patient voiced understanding. Proposito, [name] Long  

## 2013-06-18 ENCOUNTER — Telehealth: Payer: Self-pay | Admitting: Family Medicine

## 2013-06-18 NOTE — Telephone Encounter (Signed)
Patient never received shower chair that was ordered for her back in July 2014. Also, patient needs a letter sent to [facility] about her having a care giver in evenings and nights. Dr. Louanne Belton wrote one on 11/11/11. Needs a updated one. Sent one to Ms. Adams [address] [place] [address] and one to patient's home.

## 2013-06-20 ENCOUNTER — Encounter: Payer: Self-pay | Admitting: Family Medicine

## 2013-06-20 NOTE — Telephone Encounter (Signed)
I have made the letter, please print two copies and sent to places when pt has requested. Thank you. Regarding her shower chair I wrote the prescription and handed it to pt. I will write another one and leave it for her to pick up.

## 2013-06-21 NOTE — Telephone Encounter (Signed)
Please fax RX for shower chair to [facility] Fax [phone]

## 2013-07-16 ENCOUNTER — Encounter: Payer: Medicaid Other | Admitting: Family Medicine

## 2013-07-16 NOTE — Telephone Encounter (Signed)
Prescription has been placed to be faxed. Thanks.

## 2013-07-25 ENCOUNTER — Encounter: Payer: Medicaid Other | Admitting: Family Medicine

## 2013-08-02 ENCOUNTER — Encounter: Payer: Medicaid Other | Admitting: Family Medicine

## 2013-08-07 ENCOUNTER — Other Ambulatory Visit: Payer: Self-pay

## 2013-08-07 DIAGNOSIS — Z1231 Encounter for screening mammogram for malignant neoplasm of breast: Secondary | ICD-10-CM

## 2013-08-09 ENCOUNTER — Encounter: Payer: Medicaid Other | Admitting: Family Medicine

## 2013-08-19 ENCOUNTER — Encounter: Payer: Medicaid Other | Admitting: Family Medicine

## 2013-08-23 NOTE — Telephone Encounter (Signed)
[name] Long from home health care calls wanting the Rx for shower chair re fax to [facility]. Advised [name] Long the RX for faxed over a month ago and we no longer had RX on file. May call back requesting yet another Rx for shower chair FYI.

## 2013-08-29 ENCOUNTER — Encounter: Payer: Medicaid Other | Admitting: Family Medicine

## 2013-09-02 NOTE — Telephone Encounter (Signed)
Prescription will be sent again.

## 2013-09-05 ENCOUNTER — Telehealth: Payer: Self-pay | Admitting: Family Medicine

## 2013-09-05 DIAGNOSIS — M1990 Unspecified osteoarthritis, unspecified site: Secondary | ICD-10-CM

## 2013-09-05 NOTE — Telephone Encounter (Signed)
Pt called and needs a referral to continue her shots. Because of the weather they had authorized them for February only but now its in March and she has one set left and would like [place] to give permission. jw

## 2013-09-06 NOTE — Telephone Encounter (Signed)
Called pt to clarify what is needed from our side in order for her to get her gel shots at [facility] office. 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 They are requesting our number in order to proceed with treatment. I will place referral with this information.

## 2013-09-10 ENCOUNTER — Ambulatory Visit
Admission: RE | Admit: 2013-09-10 | Discharge: 2013-09-10 | Disposition: A | Discharge status: Home / Self Care | Payer: Medicaid Other | Source: Ambulatory Visit

## 2013-09-10 DIAGNOSIS — Z1231 Encounter for screening mammogram for malignant neoplasm of breast: Secondary | ICD-10-CM

## 2013-09-13 ENCOUNTER — Encounter: Payer: Medicaid Other | Admitting: Family Medicine

## 2013-10-02 ENCOUNTER — Encounter: Payer: Medicaid Other | Admitting: Family Medicine

## 2013-10-22 ENCOUNTER — Encounter: Payer: Self-pay | Admitting: Family Medicine

## 2013-10-22 ENCOUNTER — Ambulatory Visit (INDEPENDENT_AMBULATORY_CARE_PROVIDER_SITE_OTHER): Payer: Medicaid Other | Admitting: Family Medicine

## 2013-10-22 VITALS — BP 107/76 | HR 86 | Temp 98.3°F | Ht 64.0 in | Wt 143.0 lb

## 2013-10-22 DIAGNOSIS — I10 Essential (primary) hypertension: Secondary | ICD-10-CM

## 2013-10-22 DIAGNOSIS — E1129 Type 2 diabetes mellitus with other diabetic kidney complication: Secondary | ICD-10-CM

## 2013-10-22 DIAGNOSIS — N760 Acute vaginitis: Secondary | ICD-10-CM

## 2013-10-22 DIAGNOSIS — J449 Chronic obstructive pulmonary disease, unspecified: Secondary | ICD-10-CM

## 2013-10-22 DIAGNOSIS — N898 Other specified noninflammatory disorders of vagina: Secondary | ICD-10-CM

## 2013-10-22 LAB — POCT WET PREP (WET MOUNT)
Clue Cells Wet Prep Whiff POC: POSITIVE
WBC, Wet Prep HPF POC: >20

## 2013-10-22 LAB — POCT GLYCOSYLATED HEMOGLOBIN (HGB A1C): Hemoglobin A1C: 5.7

## 2013-10-22 MED ORDER — GUAIFENESIN ER 600 MG PO TB12: 600.0000 mg | ORAL_TABLET | Freq: Two times a day (BID) | ORAL | Status: DC

## 2013-10-22 NOTE — Assessment & Plan Note (Signed)
Controlled.  

## 2013-10-22 NOTE — Patient Instructions (Signed)
Your Diabetes is controlled, continue current regimen. Your blood pressure is also controlled. No changes in your medications have been made today.  you can take mucinex as prescribed for chest congestion.  F/u in 3 months at that point we will order labs.  I will call you with the wet prep results if it come back abnormal to discuss plan.

## 2013-10-22 NOTE — Progress Notes (Signed)
Family Medicine Office Visit Note   Subjective:   Patient ID: [name] Long, female  DOB: 1951/03/19, 63 y.o.. MRN: [id number]   Pt that comes today for DM f/u and has complaints of vaginal discharge and cough. DM: went to her Cardiologist Dr. Doylene Canard who decreased her metformin to 1/2 tab daily?Marland Kitchen She has been doing this and reports no hypoglycemic episodes or hyperglycemic symptoms. She tolerates this medication well.  Vaginal discharge:yellow and foul smell per pt's statement. Present for about a week. No concerned for STD's and declines further testing. Denies bleeding, pelvic pain or dyspareunia.   Cough: chronic in nature, she still smokes 1-2 cigarettes daily. Denies sputum production, fever, chills or SOB.   Review of Systems:  Pt denies chest pain, palpitations, headaches, dizziness, numbness or weakness. No changes on urinary or BM habits. No unintentional weigh loss/gain.  Objective:   Physical Exam: Gen:  NAD HEENT: Moist mucous membranes  CV: Regular rate and rhythm, no murmurs rubs or gallops PULM: Clear to auscultation bilaterally. No wheezes/rales/rhonchi ABD: Soft, non tender, non distended, normal bowel sounds EXT: No edema Neuro: Alert and oriented x3. No focalization Vulva and perianal area: Normal  Speculum: Vagina and cervix of normal appearance, no friability, yellow discharge. Bimanual exam: Uterus anteverted, no adnexal masses. No cervical motion tenderness.  Assessment & Plan:

## 2013-10-22 NOTE — Assessment & Plan Note (Signed)
Declines further STD testing today. Wet prep obtained. Will f/u results and discuss with pt at that time.

## 2013-10-22 NOTE — Assessment & Plan Note (Signed)
A1C 5.7 metformin at 1/2 tab daily.  No change in plan

## 2013-10-22 NOTE — Assessment & Plan Note (Signed)
No signs of exacerbation. Discussed tobacco cessation, pt not interested to quit at this time. Mucinex prescribed today.

## 2013-10-24 ENCOUNTER — Telehealth: Payer: Self-pay | Admitting: Family Medicine

## 2013-10-24 DIAGNOSIS — N760 Acute vaginitis: Secondary | ICD-10-CM

## 2013-10-24 DIAGNOSIS — B9689 Other specified bacterial agents as the cause of diseases classified elsewhere: Secondary | ICD-10-CM

## 2013-10-24 MED ORDER — METRONIDAZOLE 500 MG PO TABS: 2000.0000 mg | ORAL_TABLET | Freq: Once | ORAL | Status: DC

## 2013-10-24 NOTE — Telephone Encounter (Signed)
Called pt and informed about her positive trichomonas wet prep. She voiced understanding of the nature of condition and states will inform her partner about this results as well as the need for tx as well. Questions were answered. Metronidazole 2g was prescribed, advice against alcohol consumption and no sexual intercourse for 7 days after tx. Recommended to come for rest of STD screening labs.  Pt was grateful of communication.

## 2013-10-31 ENCOUNTER — Other Ambulatory Visit: Payer: Self-pay | Admitting: Family Medicine

## 2013-11-01 ENCOUNTER — Other Ambulatory Visit: Payer: Self-pay | Admitting: *Deleted

## 2013-11-01 MED ORDER — LEVOTHYROXINE SODIUM 75 MCG PO TABS: ORAL_TABLET | ORAL | Status: DC

## 2013-11-12 ENCOUNTER — Telehealth: Payer: Self-pay | Admitting: Family Medicine

## 2013-11-12 NOTE — Telephone Encounter (Signed)
[facility] called and wanted to know what the status was on the faxed orders for a shower chair. Please call them back at [phone] ext. [phone]. jw

## 2013-11-22 NOTE — Telephone Encounter (Signed)
Called back to [facility] and left voice message. They can leave their fax number and I can re-send the prescription for Shower Chair for this pt. Thanks.

## 2014-01-07 ENCOUNTER — Encounter: Payer: Self-pay | Admitting: Family Medicine

## 2014-01-07 ENCOUNTER — Ambulatory Visit (INDEPENDENT_AMBULATORY_CARE_PROVIDER_SITE_OTHER): Payer: Medicaid Other | Admitting: Family Medicine

## 2014-01-07 VITALS — BP 137/79 | HR 84 | Temp 98.3°F | Wt 142.0 lb

## 2014-01-07 DIAGNOSIS — J440 Chronic obstructive pulmonary disease with acute lower respiratory infection: Secondary | ICD-10-CM | POA: Insufficient documentation

## 2014-01-07 MED ORDER — MELOXICAM 15 MG PO TABS: 15.0000 mg | ORAL_TABLET | Freq: Every day | ORAL | Status: DC

## 2014-01-07 MED ORDER — BECLOMETHASONE DIPROPIONATE 80 MCG/ACT IN AERS: 1.0000 | INHALATION_SPRAY | RESPIRATORY_TRACT | Status: DC | PRN

## 2014-01-07 MED ORDER — AZITHROMYCIN 250 MG PO TABS: ORAL_TABLET | ORAL | Status: DC

## 2014-01-07 MED ORDER — BENZONATATE 100 MG PO CAPS: 100.0000 mg | ORAL_CAPSULE | Freq: Two times a day (BID) | ORAL | Status: DC | PRN

## 2014-01-07 NOTE — Patient Instructions (Addendum)
Dear [name], Thank you for coming in to clinic today.  Today we discussed your Cough and Bronchitis. 1. It seems like you have a bronchitis flare, with worsening rib pain due to your cough. 2. For your pain, I sent a prescription for Meloxicam (Mobic) 15mg , take 1 tablet daily with food for up to 1 month. You may also take Tylenol (follow dosing on bottle) for additional pain as needed. I recommend trying topical pain reliever such as Icy-Hot as well for rib pain. 3. Sent a prescription for Azithromycin take 2 tablets today, then take 1 tab daily for the next 4 days 4. Continue your inhaler and nebulizer as needed at home.  Please schedule a follow-up appointment with Dr. Lacinda Axon in 1-2 weeks if no improvement.  If you have any other questions or concerns, please feel free to call the clinic to contact me. You may also schedule an earlier appointment if necessary.  However, if your symptoms get significantly worse, please go to the Emergency Department to seek immediate medical attention.  Nobie Putnam, [place]

## 2014-01-07 NOTE — Assessment & Plan Note (Addendum)
Suspected acute bronchitis flare in setting of COPD, with secondary left intercostal strain d/t cough - afebrile, no respiratory distress or wheezing  Plan: 1. Azithromycin Z-pak x 5 days 2. Meloxicam 15mg  daily for 1 month (#30, 0 refills) for pain 3. Tessalon PRN cough 4. Refilled Qvar (unclear if patient was still taking this?). Continue Albuterol 5. Recommend re-evaluate COPD at baseline, determine if taking Qvar, otherwise would need alternative long acting controller / anticholinergic 6. Advised smoking cessation - pt reduced but not ready to quit 7. RTC PRN worsening

## 2014-01-07 NOTE — Progress Notes (Signed)
Subjective:     Patient ID: [name] Long, female   DOB: [birth year], 63 y.o.   MRN: [id number]  HPI  COUGH - Complains of recent history for past 3 weeks with persistent occasionally productive cough, with sinus drainage - Reports 2-3 week history of left sided rib pain radiating across flank to back, worse with deep breaths and coughing, hurts with wearing bra / pressure. Patient attributes this to persistent cough - Tried Tylenol without significant relief. Requesting refill on Meloxicam - Using Albuterol inhaler BID for past 2 months (inc from normal), has nebulizer but not currently using - Denies fever/chills, chest pain / tightness, SOB, HA, abdominal pain, nausea / vomiting - Previously followed by Pulm 15 years ago, no longer followed  PMH: COPD  I have reviewed and updated the following as appropriate: allergies and current medications  Social Hx: active smoker 5 cigs daily (reduced), >40 yr smoking hx   Review of Systems  See above HPI    Objective:   Physical Exam  BP 137/79  Pulse 84  Temp(Src) 98.3 F (36.8 C) (Oral)  Wt 142 lb (64.411 kg)  SpO2 93%  Gen - well-appearing, cooperative, NAD HEENT - patent nares w/o congestion, oropharynx clear, MMM Heart - RRR, no murmurs heard Lungs - CTAB, no wheezing, crackles, or rhonchi. Mild diminished air movement b/l bases. Normal work of breathing. Speaks in full sentences, minimal cough. MSK - lower left anterior ribs / flank / lower back mildly tender to palpation Ext - non-tender, no edema, peripheral pulses intact +2 b/l Neuro - awake, alert     Assessment:     See specific A&P problem list for details.      Plan:     See specific A&P problem list for details.

## 2014-01-09 ENCOUNTER — Ambulatory Visit: Payer: Medicaid Other | Admitting: Family Medicine

## 2014-01-15 ENCOUNTER — Other Ambulatory Visit: Payer: Self-pay | Admitting: Family Medicine

## 2014-01-28 ENCOUNTER — Other Ambulatory Visit: Payer: Medicaid Other

## 2014-02-14 ENCOUNTER — Other Ambulatory Visit: Payer: Self-pay | Admitting: Family Medicine

## 2014-02-20 ENCOUNTER — Telehealth: Payer: Self-pay | Admitting: Family Medicine

## 2014-02-20 NOTE — Telephone Encounter (Signed)
Needs letter for someone to ride with her to drs office This is for [facility] transportation Fax  [phone]

## 2014-02-21 NOTE — Telephone Encounter (Signed)
Attempted to call patient but mailbox is full.  

## 2014-02-21 NOTE — Telephone Encounter (Signed)
What exactly does she need?

## 2014-02-21 NOTE — Telephone Encounter (Signed)
Spoke with patient and she was very insistent about this letter. She asked if the doctor had looked at her chart first before calling. I asked her if she has seen Dr. Lacinda Axon before and she has not. She wanted me to let the doctor know that she is on 14 different kinds of medication, disabled, handicapped, and she needs a knee replacement but is not mentally stable to get done at the moment. So this is why she has to have this note by Monday, she will not take anything less.

## 2014-02-21 NOTE — Telephone Encounter (Signed)
[name] Long, you can draw up the letter that she wants.

## 2014-02-24 NOTE — Telephone Encounter (Signed)
I will not be back at [facility] until Friday. I am at [facility]. I can do it then

## 2014-03-12 ENCOUNTER — Ambulatory Visit
Admission: RE | Admit: 2014-03-12 | Discharge: 2014-03-12 | Disposition: A | Discharge status: Home / Self Care | Payer: Medicaid Other | Source: Ambulatory Visit | Attending: Cardiovascular Disease | Admitting: Cardiovascular Disease

## 2014-03-12 ENCOUNTER — Other Ambulatory Visit: Payer: Self-pay | Admitting: Cardiovascular Disease

## 2014-03-12 DIAGNOSIS — J4530 Mild persistent asthma, uncomplicated: Secondary | ICD-10-CM

## 2014-03-18 ENCOUNTER — Other Ambulatory Visit: Payer: Self-pay | Admitting: *Deleted

## 2014-03-18 DIAGNOSIS — E785 Hyperlipidemia, unspecified: Secondary | ICD-10-CM

## 2014-03-19 ENCOUNTER — Other Ambulatory Visit: Payer: Self-pay | Admitting: *Deleted

## 2014-03-19 DIAGNOSIS — E785 Hyperlipidemia, unspecified: Secondary | ICD-10-CM

## 2014-03-19 MED ORDER — PRAVASTATIN SODIUM 40 MG PO TABS: ORAL_TABLET | ORAL | Status: DC

## 2014-03-21 ENCOUNTER — Encounter: Payer: Self-pay | Admitting: Family Medicine

## 2014-03-21 ENCOUNTER — Ambulatory Visit (INDEPENDENT_AMBULATORY_CARE_PROVIDER_SITE_OTHER): Payer: Medicaid Other | Admitting: Family Medicine

## 2014-03-21 ENCOUNTER — Other Ambulatory Visit (HOSPITAL_COMMUNITY)
Admission: RE | Admit: 2014-03-21 | Discharge: 2014-03-21 | Disposition: A | Discharge status: Home / Self Care | Payer: Medicaid Other | Source: Ambulatory Visit | Attending: Family Medicine | Admitting: Family Medicine

## 2014-03-21 VITALS — BP 148/77 | HR 62 | Temp 97.7°F | Ht 64.0 in | Wt 137.0 lb

## 2014-03-21 DIAGNOSIS — N182 Chronic kidney disease, stage 2 (mild): Secondary | ICD-10-CM

## 2014-03-21 DIAGNOSIS — I10 Essential (primary) hypertension: Secondary | ICD-10-CM

## 2014-03-21 DIAGNOSIS — Z1211 Encounter for screening for malignant neoplasm of colon: Secondary | ICD-10-CM

## 2014-03-21 DIAGNOSIS — Z113 Encounter for screening for infections with a predominantly sexual mode of transmission: Secondary | ICD-10-CM | POA: Diagnosis not present

## 2014-03-21 DIAGNOSIS — E1129 Type 2 diabetes mellitus with other diabetic kidney complication: Secondary | ICD-10-CM

## 2014-03-21 DIAGNOSIS — J440 Chronic obstructive pulmonary disease with acute lower respiratory infection: Secondary | ICD-10-CM

## 2014-03-21 DIAGNOSIS — E038 Other specified hypothyroidism: Secondary | ICD-10-CM

## 2014-03-21 DIAGNOSIS — E039 Hypothyroidism, unspecified: Secondary | ICD-10-CM

## 2014-03-21 DIAGNOSIS — Z0000 Encounter for general adult medical examination without abnormal findings: Secondary | ICD-10-CM

## 2014-03-21 DIAGNOSIS — E119 Type 2 diabetes mellitus without complications: Secondary | ICD-10-CM

## 2014-03-21 DIAGNOSIS — N760 Acute vaginitis: Secondary | ICD-10-CM

## 2014-03-21 LAB — COMPLETE METABOLIC PANEL WITH GFR
ALT: 15 U/L (ref 0–35)
AST: 17 U/L (ref 0–37)
Albumin: 4.2 g/dL (ref 3.5–5.2)
Alkaline Phosphatase: 60 U/L (ref 39–117)
BUN: 16 mg/dL (ref 6–23)
CO2: 30 mEq/L (ref 19–32)
Calcium: 9.7 mg/dL (ref 8.4–10.5)
Chloride: 97 mEq/L (ref 96–112)
Creat: 1.17 mg/dL — ABNORMAL HIGH (ref 0.50–1.10)
GFR, Est African American: 58 mL/min — ABNORMAL LOW
GFR, Est Non African American: 50 mL/min — ABNORMAL LOW
Glucose, Bld: 75 mg/dL (ref 70–99)
Potassium: 3.4 mEq/L — ABNORMAL LOW (ref 3.5–5.3)
Sodium: 137 mEq/L (ref 135–145)
Total Bilirubin: 0.3 mg/dL (ref 0.2–1.2)
Total Protein: 7.3 g/dL (ref 6.0–8.3)

## 2014-03-21 LAB — POCT GLYCOSYLATED HEMOGLOBIN (HGB A1C): Hemoglobin A1C: 5.6

## 2014-03-21 LAB — POCT WET PREP (WET MOUNT)
Clue Cells Wet Prep Whiff POC: POSITIVE
WBC, Wet Prep HPF POC: >20

## 2014-03-21 LAB — CBC
HCT: 41.1 % (ref 36.0–46.0)
Hemoglobin: 14.6 g/dL (ref 12.0–15.0)
MCH: 35.7 pg — ABNORMAL HIGH (ref 26.0–34.0)
MCHC: 35.5 g/dL (ref 30.0–36.0)
MCV: 100.5 fL — ABNORMAL HIGH (ref 78.0–100.0)
Platelets: 239 10*3/uL (ref 150–400)
RBC: 4.09 MIL/uL (ref 3.87–5.11)
RDW: 15.4 % (ref 11.5–15.5)
WBC: 8.4 10*3/uL (ref 4.0–10.5)

## 2014-03-21 MED ORDER — FLUTICASONE PROPIONATE 50 MCG/ACT NA SUSP: 2.0000 | Freq: Every day | NASAL | Status: DC

## 2014-03-21 MED ORDER — METRONIDAZOLE 500 MG PO TABS: 500.0000 mg | ORAL_TABLET | Freq: Two times a day (BID) | ORAL | Status: DC

## 2014-03-21 MED ORDER — MELOXICAM 15 MG PO TABS: 15.0000 mg | ORAL_TABLET | Freq: Every day | ORAL | Status: DC

## 2014-03-21 MED ORDER — CETIRIZINE HCL 10 MG PO TABS: 10.0000 mg | ORAL_TABLET | Freq: Every day | ORAL | Status: DC

## 2014-03-21 NOTE — Assessment & Plan Note (Signed)
Patient in need of many preventative care items. I advised her to follow closely with me. She did inquire about stool cards for colon cancer screening. These were given today

## 2014-03-21 NOTE — Assessment & Plan Note (Signed)
Obtaining TSH today.  

## 2014-03-21 NOTE — Assessment & Plan Note (Signed)
Blood pressure okay today and nearly at goal. Will continue current regimen of Norvasc and HCTZ at this time.

## 2014-03-21 NOTE — Progress Notes (Signed)
   Subjective:    Patient ID: [name] Long, female    DOB: 1950-08-02, 63 y.o.   MRN: [id number]  HPI 63 year old female with COPD, bipolar disorder, hypertension, hypothyroidism, and tobacco abuse presents for followup.   Current issues:  1) Sinus congestion - Patient reports that she has had sinus drainage and congestion for approximately one month. - She does not describe any purulent nasal discharge. She does note some sinus pressure/pain.   - She also notes some postnasal drip. - She states her symptoms have been worse recently; she states that this is secondary to "stuff in the air" - No interventions tried. - No relieving factors. - Of note, she was treated for bronchitis with azithromycin approximately 2 months ago.  2) Vaginal discharge - Onset: 3 weeks  - Description: white in color. - Odor: Yes; patient unable to describe.  - Itching: Patient denies any itching.  She does note some "irritation". - Patient also desires STD screening today.  3) HTN - Disease Monitoring:  Home BP Monitoring - No. - Medications:  Norvasc 2.5 mg daily, HCTZ 25 mg daily - Compliance -  Yes - ROS: Denies chest pain, SOB, lightheadedness/dizziness   4) Diabetes mellitus, type 2 - Patient has a history of diabetes. - She's currently on no medications for this.  - Last A1c was 5.7 (10/2013). - No current symptoms: Polyuria, polydipsia.   PMH reviewed. Social history: Current everyday smoker.  Past Medical History  Diagnosis Date  . BIPOLAR DISORDER 08/31/2006    Qualifier: Diagnosis of  By: Dorathy Daft MD, Marjory Lies    . CHRONIC KIDNEY DISEASE STAGE II (MILD) 09/14/2009    Annotation: eGFR 64 Qualifier: Diagnosis of  By: Jess Barters MD, [name]    . COPD (chronic obstructive pulmonary disease) 09/23/2010    Diagnosed at [facility] in 2008 (Dr. Annamaria Boots)   . DM (diabetes mellitus) type II controlled with renal manifestation 08/31/2006    Qualifier: Diagnosis of  By: Dorathy Daft MD, Marjory Lies    .  HYPERCHOLESTEROLEMIA 08/31/2006    Intolerance to Lipitor OK on Crestor but medicaid no longer covering    . HYPERTENSION, BENIGN SYSTEMIC 08/31/2006    Qualifier: Diagnosis of  By: Dorathy Daft MD, Marjory Lies    . HYPOTHYROIDISM, UNSPECIFIED 08/31/2006    Qualifier: Diagnosis of  By: Dorathy Daft MD, Marjory Lies     Review of Systems Per HPI    Objective:   Physical Exam Filed Vitals:   03/21/14 1359  BP: 148/77  Pulse: 62  Temp: 97.7 F (36.5 C)   Exam: General: well appearing female in NAD.  HEENT: NCAT. Normal TM's bilaterally. Erythematous/boggy nasal mucosa. Oropharynx clear. Cardiovascular: RRR. No murmurs, rubs, or gallops. Respiratory: CTAB. No rales, rhonchi, or wheeze. Abdomen: soft, nontender, nondistended.    Assessment & Plan:  See problem list

## 2014-03-21 NOTE — Assessment & Plan Note (Signed)
Well-controlled at this time. A1c 5.6 today

## 2014-03-21 NOTE — Patient Instructions (Addendum)
It was nice to see you today.  Below is what we discussed today.  1) Sinuses - There is no evidence of infection on exam. - I prescribed Flonase and Zyrtec for symptoms.  2) Vaginal discharge - I will call you with the results of your laboratory studies.  3) HTN (high blood pressure) - Continue your current medications.  4) Diabetes - Your diabetes is well-controlled at this time.  Please followup for an annual exam/visit certainly can discuss preventative care.

## 2014-03-21 NOTE — Assessment & Plan Note (Signed)
Wet prep today revealed moderate clue cells and many Trichomonas. I attempted to discuss this with the patient over the phone as she did not want to wait on the results.  However I was unable to reach her.  I did speak with her daughter and informed her that I sent in a rx to the pharmacy for this.   Will treat with flagyl x 7 days.  Will need to inform patient about trichomonas.

## 2014-03-22 ENCOUNTER — Encounter (HOSPITAL_COMMUNITY): Payer: Self-pay | Admitting: Emergency Medicine

## 2014-03-22 ENCOUNTER — Other Ambulatory Visit: Payer: Self-pay | Admitting: Family Medicine

## 2014-03-22 ENCOUNTER — Encounter: Payer: Self-pay | Admitting: Family Medicine

## 2014-03-22 ENCOUNTER — Emergency Department (HOSPITAL_COMMUNITY)
Admission: EM | Admit: 2014-03-22 | Discharge: 2014-03-22 | Disposition: A | Discharge status: Home / Self Care | Payer: Medicaid Other | Attending: Emergency Medicine | Admitting: Emergency Medicine

## 2014-03-22 DIAGNOSIS — R51 Headache: Secondary | ICD-10-CM | POA: Insufficient documentation

## 2014-03-22 DIAGNOSIS — E039 Hypothyroidism, unspecified: Secondary | ICD-10-CM | POA: Insufficient documentation

## 2014-03-22 DIAGNOSIS — J4489 Other specified chronic obstructive pulmonary disease: Secondary | ICD-10-CM | POA: Insufficient documentation

## 2014-03-22 DIAGNOSIS — F17200 Nicotine dependence, unspecified, uncomplicated: Secondary | ICD-10-CM | POA: Insufficient documentation

## 2014-03-22 DIAGNOSIS — H579 Unspecified disorder of eye and adnexa: Secondary | ICD-10-CM | POA: Diagnosis not present

## 2014-03-22 DIAGNOSIS — Z791 Long term (current) use of non-steroidal anti-inflammatories (NSAID): Secondary | ICD-10-CM | POA: Insufficient documentation

## 2014-03-22 DIAGNOSIS — R0982 Postnasal drip: Secondary | ICD-10-CM | POA: Diagnosis not present

## 2014-03-22 DIAGNOSIS — Z79899 Other long term (current) drug therapy: Secondary | ICD-10-CM | POA: Diagnosis not present

## 2014-03-22 DIAGNOSIS — I129 Hypertensive chronic kidney disease with stage 1 through stage 4 chronic kidney disease, or unspecified chronic kidney disease: Secondary | ICD-10-CM | POA: Insufficient documentation

## 2014-03-22 DIAGNOSIS — E1129 Type 2 diabetes mellitus with other diabetic kidney complication: Secondary | ICD-10-CM | POA: Insufficient documentation

## 2014-03-22 DIAGNOSIS — Z7982 Long term (current) use of aspirin: Secondary | ICD-10-CM | POA: Insufficient documentation

## 2014-03-22 DIAGNOSIS — N182 Chronic kidney disease, stage 2 (mild): Secondary | ICD-10-CM | POA: Diagnosis not present

## 2014-03-22 DIAGNOSIS — J449 Chronic obstructive pulmonary disease, unspecified: Secondary | ICD-10-CM | POA: Diagnosis not present

## 2014-03-22 DIAGNOSIS — Z8659 Personal history of other mental and behavioral disorders: Secondary | ICD-10-CM | POA: Diagnosis not present

## 2014-03-22 DIAGNOSIS — J3489 Other specified disorders of nose and nasal sinuses: Secondary | ICD-10-CM | POA: Diagnosis not present

## 2014-03-22 DIAGNOSIS — R519 Headache, unspecified: Secondary | ICD-10-CM

## 2014-03-22 DIAGNOSIS — E7800 Pure hypercholesterolemia, unspecified: Secondary | ICD-10-CM | POA: Insufficient documentation

## 2014-03-22 LAB — TSH: TSH: 0.192 u[IU]/mL — ABNORMAL LOW (ref 0.350–4.500)

## 2014-03-22 MED ORDER — LEVOTHYROXINE SODIUM 50 MCG PO TABS: ORAL_TABLET | ORAL | Status: DC

## 2014-03-22 NOTE — ED Provider Notes (Signed)
I reviewed the resident's note and I agree with the findings and plan.   .Face to face Exam:  Patient was not in room and went to examine her.  She left prior to my exam and discharge instructions.   Dot Lanes, MD 03/22/14 [phone]

## 2014-03-22 NOTE — Discharge Instructions (Signed)
Sinus Headache A sinus headache happens when your sinuses become clogged or puffy (swollen). Sinus headaches can be mild or severe. HOME CARE  Take your medicines (antibiotics) as told. Finish them even if you start to feel better.  Only take medicine as told by your doctor.  Use a nose spray if you feel stuffed up (congested). GET HELP RIGHT AWAY IF:  You have a fever.  You have trouble seeing.  You suddenly have pain in your face or head.  You start to twitch or shake (seizure).  You are confused.  You get headaches more than once a week.  Light or sound bothers you.  You feel sick to your stomach (nauseous) or throw up (vomit).  Your headaches do not get better with treatment. MAKE SURE YOU:  Understand these instructions.  Will watch your condition.  Will get help right away if you are not doing well or get worse. Document Released: 10/20/2010 Document Revised: 09/12/2011 Document Reviewed: 10/20/2010 [facility] Patient Information 2015 [place], [place]. This information is not intended to replace advice given to you by your health care provider. Make sure you discuss any questions you have with your health care provider.

## 2014-03-22 NOTE — ED Notes (Signed)
Pt presents with c/o headache and HTN pt seen for same yesterday at PCP but is unwilling to increase antihypertensives as directed by PCP.

## 2014-03-22 NOTE — ED Provider Notes (Signed)
CSN: [id number]     Arrival date & time 03/22/14  0436 History   None    Chief Complaint  Patient presents with  . Hypertension     (Consider location/radiation/quality/duration/timing/severity/associated sxs/prior Treatment) HPI Comments: Headache  Patient is a 63 y.o. female presenting with hypertension and headaches. The history is provided by the patient.  Hypertension This is a chronic problem. The problem has been gradually worsening. Associated symptoms include congestion, headaches and a visual change. Pertinent negatives include no chest pain, chills, fever, neck pain or sore throat. Nothing aggravates the symptoms. She has tried nothing for the symptoms. The treatment provided no relief.  Headache Pain location:  Occipital Severity currently:  5/10 Severity at highest:  8/10 Duration:  1 day Timing:  Intermittent Progression:  Improving Chronicity:  New Similar to prior headaches: no   Context: not exposure to bright light, not coughing and not loud noise   Relieved by:  Acetaminophen Worsened by:  Nothing tried Ineffective treatments:  Acetaminophen Associated symptoms: congestion, drainage, sinus pressure, URI and visual change   Associated symptoms: no blurred vision, no pain, no fever, no neck pain and no sore throat     Past Medical History  Diagnosis Date  . BIPOLAR DISORDER 08/31/2006    Qualifier: Diagnosis of  By: Dorathy Daft MD, Marjory Lies    . CHRONIC KIDNEY DISEASE STAGE II (MILD) 09/14/2009    Annotation: eGFR 64 Qualifier: Diagnosis of  By: Jess Barters MD, [name]    . COPD (chronic obstructive pulmonary disease) 09/23/2010    Diagnosed at [facility] in 2008 (Dr. Annamaria Boots)   . DM (diabetes mellitus) type II controlled with renal manifestation 08/31/2006    Qualifier: Diagnosis of  By: Dorathy Daft MD, Marjory Lies    . HYPERCHOLESTEROLEMIA 08/31/2006    Intolerance to Lipitor OK on Crestor but medicaid no longer covering    . HYPERTENSION, BENIGN SYSTEMIC 08/31/2006    Qualifier:  Diagnosis of  By: Dorathy Daft MD, Marjory Lies    . HYPOTHYROIDISM, UNSPECIFIED 08/31/2006    Qualifier: Diagnosis of  By: Dorathy Daft MD, Marjory Lies     Past Surgical History  Procedure Laterality Date  . Cesarean section    . Foot surgery    . Knee surgery Bilateral    No family history on file. History  Substance Use Topics  . Smoking status: Current Every Day Smoker -- 0.50 packs/day for 39 years    Types: Cigarettes  . Smokeless tobacco: Not on file  . Alcohol Use: Yes   OB History   Grav Para Term Preterm Abortions TAB SAB Ect Mult Living                 Review of Systems  Constitutional: Negative for fever and chills.  HENT: Positive for congestion, postnasal drip, rhinorrhea and sinus pressure. Negative for sore throat.   Eyes: Positive for itching. Negative for blurred vision and pain.  Respiratory: Negative.   Cardiovascular: Negative for chest pain.  Gastrointestinal: Negative.   Musculoskeletal: Negative for neck pain.  Neurological: Positive for headaches.      Allergies  Lipitor; Lisinopril; and Codeine  Home Medications   Prior to Admission medications   Medication Sig Start Date End Date Taking? Authorizing Provider  albuterol (PROVENTIL HFA) 108 (90 BASE) MCG/ACT inhaler Inhale 2 puffs into the lungs every 4 (four) hours as needed. For wheezing 05/13/13  Yes Dayarmys Piloto de Gwendalyn Ege, MD  amLODipine (NORVASC) 2.5 MG tablet take 1 tablet by mouth once daily 05/13/13  Yes Dayarmys  [place] Gwendalyn Ege, MD  aspirin (BAYER CHILDRENS ASPIRIN) 81 MG chewable tablet Chew 81 mg by mouth daily.     Yes Historical Provider, MD  cetirizine (ZYRTEC) 10 MG tablet Take 1 tablet (10 mg total) by mouth daily. 03/21/14  Yes Jayce G Cook, DO  fluticasone (FLONASE) 50 MCG/ACT nasal spray Place 2 sprays into both nostrils daily. 03/21/14  Yes Coral Spikes, DO  hydrochlorothiazide (HYDRODIURIL) 25 MG tablet take 1 tablet by mouth once daily for blood pressure   Yes Dayarmys [place] de Gwendalyn Ege, MD   levothyroxine (SYNTHROID) 75 MCG tablet take 1 tablet by mouth once daily for HYPOTHYROIDISM 11/01/13  Yes Dayarmys [place] Gwendalyn Ege, MD  LORazepam (ATIVAN) 0.5 MG tablet Take 0.5 mg by mouth 2 (two) times daily.    Yes Historical Provider, MD  meclizine (ANTIVERT) 12.5 MG tablet Take 1 tablet (12.5 mg total) by mouth 3 (three) times daily as needed. 06/08/12  Yes Harden Mo, MD  meloxicam (MOBIC) 15 MG tablet Take 1 tablet (15 mg total) by mouth daily. 03/21/14  Yes Coral Spikes, DO  metFORMIN (GLUCOPHAGE) 500 MG tablet take 1 tablet by mouth once daily WITH BREAKFAST 03/15/13  Yes Dayarmys [place] Gwendalyn Ege, MD  methocarbamol (ROBAXIN) 500 MG tablet Take 1 tablet (500 mg total) by mouth 3 (three) times daily as needed. 03/05/13  Yes Dayarmys [place] Gwendalyn Ege, MD  metroNIDAZOLE (FLAGYL) 500 MG tablet Take 1 tablet (500 mg total) by mouth 2 (two) times daily. 03/21/14  Yes Coral Spikes, DO  omeprazole (PRILOSEC) 40 MG capsule take 1 capsule by mouth once daily   Yes Coral Spikes, DO  pravastatin (PRAVACHOL) 40 MG tablet take 1 tablet by mouth every evening 03/19/14  Yes Coral Spikes, DO  pravastatin (PRAVACHOL) 40 MG tablet take 1 tablet by mouth every evening 03/19/14  Yes Jayce G Cook, DO  risperiDONE (RISPERDAL) 2 MG tablet Take 0.5 tablets (1 mg total) by mouth at bedtime. 10/22/10  Yes [name], MD  divalproex (DEPAKOTE) 250 MG EC tablet Take 250 mg by mouth 4 (four) times daily.      Historical Provider, MD  HYDROcodone-acetaminophen (VICODIN) 5-500 MG per tablet Take 1 tablet by mouth every 8 (eight) hours as needed for pain.  11/22/11   Historical Provider, MD  losartan (COZAAR) 50 MG tablet Take 1 tablet (50 mg total) by mouth daily. 08/18/11 08/17/12  Vivi Ferns, MD  nitroGLYCERIN (NITROSTAT) 0.4 MG SL tablet Place 1 tablet (0.4 mg total) under the tongue every 5 (five) minutes as needed for chest pain. 04/27/12   Vivi Ferns, MD   BP 153/93  Pulse 64  Temp(Src) 98.5 F (36.9 C) (Oral)   Resp 18  Ht _0  (1.626 m)  Wt 138 lb (62.596 kg)  BMI 23.68 kg/m2  SpO2 99% Physical Exam  Vitals reviewed. Constitutional: She appears well-developed and well-nourished. No distress.  HENT:  Nose: Right sinus exhibits maxillary sinus tenderness and frontal sinus tenderness. Left sinus exhibits maxillary sinus tenderness and frontal sinus tenderness.  Mouth/Throat: Oropharynx is clear and moist.  Eyes: EOM are normal. Pupils are equal, round, and reactive to light. Left eye exhibits no discharge.  Cardiovascular: Normal rate, regular rhythm and normal heart sounds.   No murmur heard. Pulmonary/Chest: Effort normal and breath sounds normal. No respiratory distress.  Abdominal: Soft.  Skin: Skin is warm. No rash noted.    ED Course  Procedures (  including critical care time) Labs Review Labs Reviewed - No data to display  Imaging Review No results found.   EKG Interpretation None      MDM   Final diagnoses:  Sinus headache   Patient presented with HA x 2 days and viral URI symptoms and concern that her BP is higher than normal. Seen yesterday at PCP for same and BP was wnl. HA improved in ED and she declined any treatment. She was advised that her BP was slightly elevated but not concerning for her age and current illness. She was satisfied with knowing her BP wasn't concerning. She was advised on symptomatic treatment for her sinus HA. Patient left prior to getting discharge instructions/paperwork.     Olam Idler, MD 03/22/14 5361  Olam Idler, MD 03/22/14 [id number]

## 2014-03-24 LAB — CERVICOVAGINAL ANCILLARY ONLY
Chlamydia: NEGATIVE
Neisseria Gonorrhea: NEGATIVE

## 2014-03-26 ENCOUNTER — Telehealth: Payer: Self-pay | Admitting: Family Medicine

## 2014-03-26 NOTE — Telephone Encounter (Signed)
Please call pt because Dr Lacinda Axon gave information to pt daughter. She needs a copy of caregiver paper  The social work that talked to pt should have it( this was 3 yrs ago) [employer] supplies her aides. [employer] needs a letter stating that she needs her aides for more hours.

## 2014-03-27 ENCOUNTER — Encounter: Payer: Self-pay | Admitting: Family Medicine

## 2014-03-27 NOTE — Telephone Encounter (Signed)
Patient can pick up letter.

## 2014-03-27 NOTE — Telephone Encounter (Signed)
I will write a letter as soon as I can (may be a few days). FYI: I did not give any personal information to her daughter.  I told her daughter to inform her that her test results were back and that I was sending in a prescription.

## 2014-03-27 NOTE — Telephone Encounter (Signed)
Pt calls again, states she really would like to speak to Dr. Lacinda Axon. Pt states that Dr. Lacinda Axon gave medical information to pt's daughter without her approval. Pt also states she would like to have her nurse aid for 70 hrs, instead of the 54 hrs that it has been reduced to. Pt states that she was sick during the time she could have appealed the reduction and was not able to. The only way she can now appeal the hour reduction is to have a letter from her MD stating she needs the aid for 70 hrs.

## 2014-03-31 ENCOUNTER — Encounter: Payer: Self-pay | Admitting: Family Medicine

## 2014-03-31 ENCOUNTER — Ambulatory Visit (INDEPENDENT_AMBULATORY_CARE_PROVIDER_SITE_OTHER): Payer: Medicaid Other | Admitting: Family Medicine

## 2014-03-31 VITALS — BP 150/86 | HR 75 | Temp 97.5°F | Ht 64.0 in | Wt 142.0 lb

## 2014-03-31 DIAGNOSIS — I10 Essential (primary) hypertension: Secondary | ICD-10-CM

## 2014-03-31 MED ORDER — AMLODIPINE BESYLATE 5 MG PO TABS: 5.0000 mg | ORAL_TABLET | Freq: Every day | ORAL | Status: DC

## 2014-03-31 NOTE — Progress Notes (Signed)
[name] Long is a 63 y.o. female who presents today for elevated BP.  Pt states she was seen in clinic sometime last week and had elevated BP "to 140s".  Subsequently, she was seen in the ED over the weekend and had BP to "150s".  Her BP is normally 120s/80s.  States she has been under more stress recently.  Denies any HA, blurred vision, diplopia, CP, SOB, radiating back pain, N/V/D.   On HCTZ and Norvasc at home, compliant with medications, denies ADRs.   Past Medical History  Diagnosis Date  . BIPOLAR DISORDER 08/31/2006    Qualifier: Diagnosis of  By: Dorathy Daft MD, Marjory Lies    . CHRONIC KIDNEY DISEASE STAGE II (MILD) 09/14/2009    Annotation: eGFR 64 Qualifier: Diagnosis of  By: Jess Barters MD, [name]    . COPD (chronic obstructive pulmonary disease) 09/23/2010    Diagnosed at [facility] in 2008 (Dr. Annamaria Boots)   . DM (diabetes mellitus) type II controlled with renal manifestation 08/31/2006    Qualifier: Diagnosis of  By: Dorathy Daft MD, Marjory Lies    . HYPERCHOLESTEROLEMIA 08/31/2006    Intolerance to Lipitor OK on Crestor but medicaid no longer covering    . HYPERTENSION, BENIGN SYSTEMIC 08/31/2006    Qualifier: Diagnosis of  By: Dorathy Daft MD, Marjory Lies    . HYPOTHYROIDISM, UNSPECIFIED 08/31/2006    Qualifier: Diagnosis of  By: Dorathy Daft MD, Marjory Lies      History  Smoking status  . Current Every Day Smoker -- 0.50 packs/day for 39 years  . Types: Cigarettes  Smokeless tobacco  . Not on file    No family history on file.  Current Outpatient Prescriptions on File Prior to Visit  Medication Sig Dispense Refill  . albuterol (PROVENTIL HFA) 108 (90 BASE) MCG/ACT inhaler Inhale 2 puffs into the lungs every 4 (four) hours as needed. For wheezing  1 Inhaler  6  . aspirin (BAYER CHILDRENS ASPIRIN) 81 MG chewable tablet Chew 81 mg by mouth daily.        . cetirizine (ZYRTEC) 10 MG tablet Take 1 tablet (10 mg total) by mouth daily.  30 tablet  0  . divalproex (DEPAKOTE) 250 MG EC tablet Take 250 mg by mouth 4  (four) times daily.        . fluticasone (FLONASE) 50 MCG/ACT nasal spray Place 2 sprays into both nostrils daily.  16 g  1  . hydrochlorothiazide (HYDRODIURIL) 25 MG tablet take 1 tablet by mouth once daily for blood pressure  90 tablet  3  . HYDROcodone-acetaminophen (VICODIN) 5-500 MG per tablet Take 1 tablet by mouth every 8 (eight) hours as needed for pain.       Marland Kitchen levothyroxine (SYNTHROID) 50 MCG tablet take 1 tablet by mouth once daily for HYPOTHYROIDISM  30 tablet  2  . LORazepam (ATIVAN) 0.5 MG tablet Take 0.5 mg by mouth 2 (two) times daily.       Marland Kitchen losartan (COZAAR) 50 MG tablet Take 1 tablet (50 mg total) by mouth daily.  90 tablet  3  . meclizine (ANTIVERT) 12.5 MG tablet Take 1 tablet (12.5 mg total) by mouth 3 (three) times daily as needed.  30 tablet  0  . meloxicam (MOBIC) 15 MG tablet Take 1 tablet (15 mg total) by mouth daily.  30 tablet  0  . metFORMIN (GLUCOPHAGE) 500 MG tablet take 1 tablet by mouth once daily WITH BREAKFAST  30 tablet  2  . methocarbamol (ROBAXIN) 500 MG tablet Take 1 tablet (500  mg total) by mouth 3 (three) times daily as needed.  90 tablet  3  . metroNIDAZOLE (FLAGYL) 500 MG tablet Take 1 tablet (500 mg total) by mouth 2 (two) times daily.  14 tablet  0  . nitroGLYCERIN (NITROSTAT) 0.4 MG SL tablet Place 1 tablet (0.4 mg total) under the tongue every 5 (five) minutes as needed for chest pain.  50 tablet  3  . omeprazole (PRILOSEC) 40 MG capsule take 1 capsule by mouth once daily  30 capsule  11  . pravastatin (PRAVACHOL) 40 MG tablet take 1 tablet by mouth every evening  30 tablet  11  . pravastatin (PRAVACHOL) 40 MG tablet take 1 tablet by mouth every evening  30 tablet  11  . risperiDONE (RISPERDAL) 2 MG tablet Take 0.5 tablets (1 mg total) by mouth at bedtime.       No current facility-administered medications on file prior to visit.    ROS: Per HPI.  All other systems reviewed and are negative.   Physical Exam Filed Vitals:   03/31/14 1116  BP:  150/86  Pulse: 75  Temp: 97.5 F (36.4 C)    Physical Examination: General appearance - alert, well appearing, and in no distress Chest - clear to auscultation, no wheezes, rales or rhonchi, symmetric air entry Heart - normal rate and regular rhythm    Chemistry      Component Value Date/Time   NA 137 03/21/2014 1432   K 3.4* 03/21/2014 1432   CL 97 03/21/2014 1432   CO2 30 03/21/2014 1432   BUN 16 03/21/2014 1432   CREATININE 1.17* 03/21/2014 1432   CREATININE 1.12 11/17/2009 2106      Component Value Date/Time   CALCIUM 9.7 03/21/2014 1432   ALKPHOS 60 03/21/2014 1432   AST 17 03/21/2014 1432   ALT 15 03/21/2014 1432   BILITOT 0.3 03/21/2014 1432

## 2014-03-31 NOTE — Patient Instructions (Signed)
Amlodipine tablets What is this medicine? AMLODIPINE (am LOE di peen) is a calcium-channel blocker. It affects the amount of calcium found in your heart and muscle cells. This relaxes your blood vessels, which can reduce the amount of work the heart has to do. This medicine is used to lower high blood pressure. It is also used to prevent chest pain. This medicine may be used for other purposes; ask your health care provider or pharmacist if you have questions. COMMON BRAND NAME(S): Norvasc What should I tell my health care provider before I take this medicine? They need to know if you have any of these conditions: -heart problems like heart failure or aortic stenosis -liver disease -an unusual or allergic reaction to amlodipine, other medicines, foods, dyes, or preservatives -pregnant or trying to get pregnant -breast-feeding How should I use this medicine? Take this medicine by mouth with a glass of water. Follow the directions on the prescription label. Take your medicine at regular intervals. Do not take more medicine than directed. Talk to your pediatrician regarding the use of this medicine in children. Special care may be needed. This medicine has been used in children as young as 6. Persons over 26 years old may have a stronger reaction to this medicine and need smaller doses. Overdosage: If you think you have taken too much of this medicine contact a poison control center or emergency room at once. NOTE: This medicine is only for you. Do not share this medicine with others. What if I miss a dose? If you miss a dose, take it as soon as you can. If it is almost time for your next dose, take only that dose. Do not take double or extra doses. What may interact with this medicine? -herbal or dietary supplements -local or general anesthetics -medicines for high blood pressure -medicines for prostate problems -rifampin This list may not describe all possible interactions. Give your health  care provider a list of all the medicines, herbs, non-prescription drugs, or dietary supplements you use. Also tell them if you smoke, drink alcohol, or use illegal drugs. Some items may interact with your medicine. What should I watch for while using this medicine? Visit your doctor or health care professional for regular check ups. Check your blood pressure and pulse rate regularly. Ask your health care professional what your blood pressure and pulse rate should be, and when you should contact him or her. This medicine may make you feel confused, dizzy or lightheaded. Do not drive, use machinery, or do anything that needs mental alertness until you know how this medicine affects you. To reduce the risk of dizzy or fainting spells, do not sit or stand up quickly, especially if you are an older patient. Avoid alcoholic drinks; they can make you more dizzy. Do not suddenly stop taking amlodipine. Ask your doctor or health care professional how you can gradually reduce the dose. What side effects may I notice from receiving this medicine? Side effects that you should report to your doctor or health care professional as soon as possible: -allergic reactions like skin rash, itching or hives, swelling of the face, lips, or tongue -breathing problems -changes in vision or hearing -chest pain -fast, irregular heartbeat -swelling of legs or ankles Side effects that usually do not require medical attention (report to your doctor or health care professional if they continue or are bothersome): -dry mouth -facial flushing -nausea, vomiting -stomach gas, pain -tired, weak -trouble sleeping This list may not describe all possible side  effects. Call your doctor for medical advice about side effects. You may report side effects to FDA at 1-800-FDA-1088. °Where should I keep my medicine? °Keep out of the reach of children. °Store at room temperature between 59 and 86 degrees F (15 and 30 degrees C). Protect from  light. Keep container tightly closed. Throw away any unused medicine after the expiration date. °NOTE: This sheet is a summary. It may not cover all possible information. If you have questions about this medicine, talk to your doctor, pharmacist, or health care provider. °© 2015, [employer]. (2012-05-18 11:40:58) ° °

## 2014-03-31 NOTE — Assessment & Plan Note (Signed)
Elevated BP recently, and up to 886 systolic today.  Probably related to stress/home events, but will increase norvasc to 5 mg daily for now.  Would consider ARB in pt if unable to take ACE as she does have CKD/DM.  F/U with PCP in 2 weeks.

## 2014-04-01 ENCOUNTER — Telehealth: Payer: Self-pay | Admitting: *Deleted

## 2014-04-01 NOTE — Telephone Encounter (Signed)
Spoke with patient about this issue. Now she is needing to have a letter written by Dr. Lacinda Axon stating that she can and is need of a NIGHT time personal care person. She has a daytime one now the night time is to help with her dishes, bathing at night time and to clean up. This is due to her having low blood sugar at night and that he medication makes her :loopy" and she cannot function. Will FWD to PCP

## 2014-04-01 NOTE — Telephone Encounter (Signed)
Pt needs to speak with [name] Long re: papers from yesterday, says she was suppose to get custodian papers back from him but didn't. Also mentioned putting in a request to switch providers, wants to know if that process is started yet.

## 2014-04-04 NOTE — Telephone Encounter (Signed)
Patient had complete Patient Feedback Form on 03/31/14 stating "Explaining personal business of examination to my daughter.  Not given a medication for high blood pressure pills.  Cause me to have to go to the hospital."  Called patient for additional information.  Patient states Dr. Lacinda Axon told her daughter that "an antibiotic was sent to the pharmacy and my lab results."  Rx was sent to pharmacy for Patient did not provide info about lab test or antibiotic that she stated was told to her daughter.  Patient also stated that lab results were mailed to her house; does report results were addressed to her and did not state if daughter opened envelope.  Informed patient that it is routine practice for results to be mailed and the letter was addressed to her and is not considered a HIPPA violation.  Patient requesting to changer her PCP because Dr. Lacinda Axon "told my personal business."  Discussed with Dr. Lacinda Axon.  Will route to Dr. Gwenlyn Saran for advice and call patient back.  Burna Forts, BSN, RN-BC

## 2014-04-21 ENCOUNTER — Telehealth: Payer: Self-pay | Admitting: Family Medicine

## 2014-04-21 ENCOUNTER — Other Ambulatory Visit: Payer: Self-pay | Admitting: Family Medicine

## 2014-04-21 NOTE — Telephone Encounter (Signed)
Pt called and would like Jeannette to call her. She said that she has been waiting for over two weeks to hear something from [place]. She said that she needs the letter and that this is not her fault since she didn't break the code of silence. Please call when possible. jw

## 2014-04-22 NOTE — Telephone Encounter (Signed)
Discussed with Dr. Gwenlyn Saran and she will follow up with Dr. Lacinda Axon.  Will change patient's PCP to Dr. Lajuana Ripple to meet new MD and discuss patient's request to have personal care services hours increased.  Appt scheduled for 05/02/14 at 3:00 pm.  Informed patient to bring required paperwork to office visit if she has any that needs to be completed.  Burna Forts, BSN, RN-BC

## 2014-04-22 NOTE — Telephone Encounter (Signed)
Discussed with Dr. Gwenlyn Saran and she will follow up with Dr. Lacinda Axon. Will change patient's PCP to Dr. Lajuana Ripple to meet new MD and discuss patient's request to have personal care services hours increased. Appt scheduled for 05/02/14 at 3:00 pm. Informed patient to bring required paperwork to office visit if she has any that needs to be completed. Burna Forts, BSN, RN-BC

## 2014-05-02 ENCOUNTER — Encounter: Payer: Self-pay | Admitting: Family Medicine

## 2014-05-02 ENCOUNTER — Ambulatory Visit (INDEPENDENT_AMBULATORY_CARE_PROVIDER_SITE_OTHER): Payer: Medicaid Other | Admitting: Family Medicine

## 2014-05-02 VITALS — BP 116/77 | HR 74 | Temp 98.1°F | Ht 64.0 in | Wt 144.0 lb

## 2014-05-02 DIAGNOSIS — Z23 Encounter for immunization: Secondary | ICD-10-CM

## 2014-05-02 NOTE — Progress Notes (Signed)
Patient ID: FADUMO HENG, female   DOB: September 27, 1950, 63 y.o.   MRN: [id number]    Subjective: CC: sinus pressure/congestion2 HPI: Patient is a 63 y.o. female presenting to clinic today to establish care with new Dr/PCS hours. Concerns today include:  1. Sinus congestion/pressure Patient reports that she has had several months of sinus pressure and congestion.  She was seen by a prx provider that started her on Flonase.  Patient admits to not being compliant with medication and possibly not have been using medication correctly.  She states that headaches are relieved by OTC analgesics.  She admits to rhinorrhea, cough, congestion.  She denies fevers, chills, vision changes, ataxia, dizziness.  2. Coordination of care Patient reports that she is in need of night time home health aid. She states that she has a daytime helper that helps with ADLs.  She states that she is regularly unstable on her feet, especially when she gets up to have a snack during the nighttime because of feelings of low blood sugar.      History Reviewed: yes smoker.   Health Maintenance: flu vaccine today.  ROS: All other systems reviewed and are negative.  Objective: Office vital signs reviewed. BP 116/77  Pulse 74  Temp(Src) 98.1 F (36.7 C) (Oral)  Ht 5\' 4"  (1.626 m)  Wt 144 lb (65.318 kg)  BMI 24.71 kg/m2  Physical Examination:  General: Awake, alert, well nourished, well appearing female, NAD HEENT: Atraumatic, normocephalic, +mild infraorbital edema, no TTP to sinuses    Neck: No masses palpated. No LAD    Ears: TMs intact, mild bugling of TM resulting in mildly distorted light reflex, no erythema, no purulence     Eyes: PERRLA, EOMI    Nose: nasal turbinates moist with moderate edema, +rhinorrhea    Throat: MMM, mild o/p erythema Pulm: CTAB, no wheezes, rhonchi or rales MSK: Normal gait and station  Assessment: 63 y.o. female allergic rhinitis and home health needs.  **>25 minutes of face to  face time was spent in the coordination of care for this patient.  Plan: See Problem List and After Visit Summary   [name], DO PGY-1, [facility]

## 2014-05-02 NOTE — Patient Instructions (Addendum)
It was a pleasure seeing you today!  Information regarding what we discussed is included in this packet.  Please feel free to call our office if any questions or concerns arise.  Here's how to use the flonase.  Make sure to use 2 (TWO) sprays in each nostril every day.  If in 2 weeks you notice NO difference in your symptoms call for an appointment and we will reassess.  If you DO see improvement but you are not totally better, keep taking the medicine.  It may take several weeks before your symptoms are well controlled.  I will get in touch with Constance Holster, our social worker about your home aid hours.  Ashly M. Lajuana Ripple, DO

## 2014-05-03 NOTE — Progress Notes (Signed)
Reviewed and discussed with resident. 

## 2014-05-06 ENCOUNTER — Encounter: Payer: Self-pay | Admitting: Family Medicine

## 2014-05-13 ENCOUNTER — Telehealth: Payer: Self-pay | Admitting: Family Medicine

## 2014-05-13 NOTE — Telephone Encounter (Signed)
Pt called and needs her referral to [facility] extended for 3 more weeks. She is seeing Dr. French Ana and he is giving her injections in her knee every Tuesday for the next three weeks. jw

## 2014-05-15 NOTE — Telephone Encounter (Signed)
I will try and address this tomorrow in clinic with one of the attendings.  Thanks so much!

## 2014-05-19 ENCOUNTER — Telehealth: Payer: Self-pay | Admitting: Family Medicine

## 2014-05-19 NOTE — Telephone Encounter (Signed)
Pt called because she was only given 1 referral to get shots in her knee. She gets shots every week and doesn't understand why she should have to come in here to talk to the doctor when the previous doctor knows this information and it should be in her chart. She would like to talk to the doctor so that she can explain this. Please call as soon as you can. jw

## 2014-07-15 ENCOUNTER — Other Ambulatory Visit: Payer: Self-pay | Admitting: Family Medicine

## 2014-08-05 ENCOUNTER — Telehealth: Payer: Self-pay | Admitting: Family Medicine

## 2014-08-05 ENCOUNTER — Other Ambulatory Visit: Payer: Self-pay

## 2014-08-05 DIAGNOSIS — Z1231 Encounter for screening mammogram for malignant neoplasm of breast: Secondary | ICD-10-CM

## 2014-08-05 DIAGNOSIS — Z780 Asymptomatic menopausal state: Secondary | ICD-10-CM

## 2014-08-05 NOTE — Telephone Encounter (Signed)
No referral needed for mammo.  Unsure if ins will pay for Dexa.  Patient postmenopausal?

## 2014-08-05 NOTE — Telephone Encounter (Signed)
Pt called and needs to have a referral to have a mammogram and a bone density test. Please send the referral to the [facility]. jw

## 2014-08-07 ENCOUNTER — Other Ambulatory Visit: Payer: Self-pay | Admitting: *Deleted

## 2014-08-07 MED ORDER — METFORMIN HCL 500 MG PO TABS: ORAL_TABLET | ORAL | Status: DC

## 2014-08-28 LAB — HM DIABETES EYE EXAM

## 2014-08-29 ENCOUNTER — Telehealth: Payer: Self-pay | Admitting: Family Medicine

## 2014-08-29 ENCOUNTER — Other Ambulatory Visit: Payer: Self-pay | Admitting: Family Medicine

## 2014-08-29 DIAGNOSIS — Z780 Asymptomatic menopausal state: Secondary | ICD-10-CM

## 2014-08-29 NOTE — Telephone Encounter (Signed)
Pt needs to know if a nurse can call the breast center and schedule her mammogram and bone density test, says she tried and they told her she needed a referral from [place].

## 2014-08-29 NOTE — Telephone Encounter (Signed)
Patient called back and relayed message by Junie Panning

## 2014-08-29 NOTE — Telephone Encounter (Addendum)
LM with female for patient to call back to inform her of appointment for DEXA scan set up for Wednesday 09/03/2014 @ the breast center, patient needs to call them if needing reschedule @ [phone]

## 2014-08-29 NOTE — Telephone Encounter (Signed)
Can you please put in order for Dexa scan

## 2014-08-29 NOTE — Telephone Encounter (Signed)
Pt calling to find out about

## 2014-08-29 NOTE — Telephone Encounter (Signed)
error 

## 2014-08-29 NOTE — Addendum Note (Signed)
Addended by: Johny Shears on: 08/29/2014 12:19 PM   Modules accepted: Orders

## 2014-08-29 NOTE — Telephone Encounter (Deleted)
LVM for

## 2014-08-29 NOTE — Telephone Encounter (Signed)
done

## 2014-09-03 ENCOUNTER — Other Ambulatory Visit: Payer: Medicaid Other

## 2014-09-03 ENCOUNTER — Ambulatory Visit: Payer: Medicaid Other

## 2014-09-05 ENCOUNTER — Other Ambulatory Visit: Payer: Medicaid Other

## 2014-09-05 ENCOUNTER — Ambulatory Visit: Payer: Medicaid Other

## 2014-09-09 ENCOUNTER — Telehealth: Payer: Self-pay | Admitting: Family Medicine

## 2014-09-09 DIAGNOSIS — J440 Chronic obstructive pulmonary disease with acute lower respiratory infection: Secondary | ICD-10-CM

## 2014-09-09 DIAGNOSIS — M159 Polyosteoarthritis, unspecified: Secondary | ICD-10-CM

## 2014-09-09 NOTE — Telephone Encounter (Signed)
Pt called and needs the letter that she and the doctor talked about filled out. She ordinally said that she didn't need that letter but she does need it filled out. Please do this and mail to the patient. The address in Epic is correct. jw

## 2014-09-10 ENCOUNTER — Other Ambulatory Visit: Payer: Self-pay | Admitting: Family Medicine

## 2014-09-10 MED ORDER — MELOXICAM 15 MG PO TABS: 15.0000 mg | ORAL_TABLET | Freq: Every day | ORAL | Status: DC

## 2014-09-10 NOTE — Telephone Encounter (Signed)
Spoke to patient on phone this afternoon.  Apparently, the note that I sent with her in October was insufficient to obtain extra hours.  Will contact [facility] to see what forms I need to fill out in order to increase her aide hours.

## 2014-09-17 ENCOUNTER — Encounter: Payer: Self-pay | Admitting: Family Medicine

## 2014-09-18 ENCOUNTER — Ambulatory Visit
Admission: RE | Admit: 2014-09-18 | Discharge: 2014-09-18 | Disposition: A | Discharge status: Home / Self Care | Payer: Medicaid Other | Source: Ambulatory Visit | Attending: Family Medicine | Admitting: Family Medicine

## 2014-09-18 ENCOUNTER — Ambulatory Visit
Admission: RE | Admit: 2014-09-18 | Discharge: 2014-09-18 | Disposition: A | Discharge status: Home / Self Care | Payer: Medicaid Other | Source: Ambulatory Visit

## 2014-09-18 DIAGNOSIS — Z1231 Encounter for screening mammogram for malignant neoplasm of breast: Secondary | ICD-10-CM

## 2014-09-18 DIAGNOSIS — Z780 Asymptomatic menopausal state: Secondary | ICD-10-CM

## 2014-09-19 ENCOUNTER — Encounter: Payer: Self-pay | Admitting: Family Medicine

## 2014-10-07 ENCOUNTER — Telehealth: Payer: Self-pay | Admitting: *Deleted

## 2014-10-07 NOTE — Telephone Encounter (Signed)
Kim with [facility] called regarding referral for patient.  Please give her a call at [phone]. Derl Barrow, RN

## 2014-10-08 NOTE — Telephone Encounter (Signed)
I wasn't aware of a referral to this office.  I have contacted the number provided and Maudie Mercury is out for the day.  I have left my contact information with the receptionist to call me tomorrow regarding this patient.

## 2014-10-09 NOTE — Telephone Encounter (Signed)
Spoke to [employer]. All done.  Patient should be good to go for appt this month.

## 2014-10-13 ENCOUNTER — Other Ambulatory Visit: Payer: Self-pay | Admitting: Family Medicine

## 2014-10-13 ENCOUNTER — Telehealth: Payer: Self-pay | Admitting: Family Medicine

## 2014-10-13 DIAGNOSIS — E039 Hypothyroidism, unspecified: Secondary | ICD-10-CM

## 2014-10-13 MED ORDER — LEVOTHYROXINE SODIUM 50 MCG PO TABS: 50.0000 ug | ORAL_TABLET | Freq: Every day | ORAL | Status: DC

## 2014-10-13 NOTE — Telephone Encounter (Signed)
spoke with patient and informed him of below. She is coming on on 4/29 fpr her cpe/pap visit

## 2014-10-13 NOTE — Telephone Encounter (Signed)
Will RF x1.  Patient needs OV for TSH check.

## 2014-10-13 NOTE — Telephone Encounter (Signed)
Pt called and needs a refill on her Synthroid called in. jw

## 2014-10-31 ENCOUNTER — Encounter: Payer: Medicaid Other | Admitting: Family Medicine

## 2014-11-10 ENCOUNTER — Other Ambulatory Visit: Payer: Self-pay | Admitting: *Deleted

## 2014-11-10 DIAGNOSIS — E039 Hypothyroidism, unspecified: Secondary | ICD-10-CM

## 2014-11-10 MED ORDER — LEVOTHYROXINE SODIUM 50 MCG PO TABS: 50.0000 ug | ORAL_TABLET | Freq: Every day | ORAL | Status: DC

## 2014-11-10 NOTE — Telephone Encounter (Signed)
Needs office visit. RF x1 only.

## 2014-11-11 NOTE — Telephone Encounter (Signed)
Pt informed of refill for synthroid sent in to pharmacy and to make a follow up appt in one month.  Derl Barrow, RN

## 2014-11-20 ENCOUNTER — Other Ambulatory Visit: Payer: Self-pay | Admitting: *Deleted

## 2014-11-20 MED ORDER — HYDROCHLOROTHIAZIDE 25 MG PO TABS: ORAL_TABLET | ORAL | Status: DC

## 2014-11-28 ENCOUNTER — Encounter: Payer: Medicaid Other | Admitting: Family Medicine

## 2014-12-04 ENCOUNTER — Ambulatory Visit (INDEPENDENT_AMBULATORY_CARE_PROVIDER_SITE_OTHER): Payer: Medicaid Other | Admitting: Family Medicine

## 2014-12-04 ENCOUNTER — Encounter: Payer: Self-pay | Admitting: Family Medicine

## 2014-12-04 VITALS — BP 130/82 | HR 72 | Temp 98.6°F | Ht 64.0 in | Wt 146.0 lb

## 2014-12-04 DIAGNOSIS — N3000 Acute cystitis without hematuria: Secondary | ICD-10-CM | POA: Diagnosis not present

## 2014-12-04 DIAGNOSIS — N3090 Cystitis, unspecified without hematuria: Secondary | ICD-10-CM

## 2014-12-04 DIAGNOSIS — R350 Frequency of micturition: Secondary | ICD-10-CM

## 2014-12-04 LAB — POCT URINALYSIS DIPSTICK
Bilirubin, UA: NEGATIVE
Blood, UA: NEGATIVE
Glucose, UA: NEGATIVE
Ketones, UA: NEGATIVE
Nitrite, UA: NEGATIVE
Protein, UA: NEGATIVE
Spec Grav, UA: 1.01
Urobilinogen, UA: 0.2
pH, UA: 7

## 2014-12-04 LAB — POCT UA - MICROSCOPIC ONLY

## 2014-12-04 MED ORDER — CEPHALEXIN 500 MG PO CAPS: 500.0000 mg | ORAL_CAPSULE | Freq: Four times a day (QID) | ORAL | Status: DC

## 2014-12-04 NOTE — Patient Instructions (Signed)

## 2014-12-04 NOTE — Progress Notes (Signed)
VOJ:[id number], [name], DO Chief Complaint  Patient presents with  . Dysuria  . Urinary Frequency    Current Issues:  Presents with 5 days of dysuria, urinary urgency, urinary frequency and suprapubic tenderness Associated symptoms include:  dysuria, lower abdominal pain, nausea, urinary frequency and urinary urgency  There is no history of of similar symptoms. Sexually active:  No   No concern for STI.  Prior to Admission medications   Medication Sig Start Date End Date Taking? Authorizing Provider  albuterol (PROVENTIL HFA) 108 (90 BASE) MCG/ACT inhaler Inhale 2 puffs into the lungs every 4 (four) hours as needed. For wheezing 05/13/13   Dayarmys Piloto de Gwendalyn Ege, MD  amLODipine (NORVASC) 5 MG tablet Take 1 tablet (5 mg total) by mouth daily. 03/31/14   Nolon Rod, DO  aspirin (BAYER CHILDRENS ASPIRIN) 81 MG chewable tablet Chew 81 mg by mouth daily.      Historical Provider, MD  cephALEXin (KEFLEX) 500 MG capsule Take 1 capsule (500 mg total) by mouth 4 (four) times daily. 12/04/14   Aquilla Hacker, MD  cetirizine (ZYRTEC) 10 MG tablet Take 1 tablet (10 mg total) by mouth daily. 03/21/14   Coral Spikes, DO  divalproex (DEPAKOTE) 250 MG EC tablet Take 250 mg by mouth 4 (four) times daily.      Historical Provider, MD  fluticasone (FLONASE) 50 MCG/ACT nasal spray Place 2 sprays into both nostrils daily. 03/21/14   Coral Spikes, DO  hydrochlorothiazide (HYDRODIURIL) 25 MG tablet take 1 tablet by mouth once daily for blood pressure 11/20/14   [name], DO  HYDROcodone-acetaminophen (VICODIN) 5-500 MG per tablet Take 1 tablet by mouth every 8 (eight) hours as needed for pain.  11/22/11   Historical Provider, MD  Lancets (ACCU-CHEK MULTICLIX) lancets TEST BLOOD SUGAR 1 TO 3 TIMES PER WEEK 04/21/14   Coral Spikes, DO  levothyroxine (SYNTHROID) 50 MCG tablet Take 1 tablet (50 mcg total) by mouth daily before breakfast. 11/10/14   [name], DO  LORazepam (ATIVAN) 0.5 MG tablet  Take 0.5 mg by mouth 2 (two) times daily.     Historical Provider, MD  losartan (COZAAR) 50 MG tablet Take 1 tablet (50 mg total) by mouth daily. 08/18/11 08/17/12  Vivi Ferns, MD  meclizine (ANTIVERT) 12.5 MG tablet Take 1 tablet (12.5 mg total) by mouth 3 (three) times daily as needed. 06/08/12   Harden Mo, MD  meloxicam (MOBIC) 15 MG tablet Take 1 tablet (15 mg total) by mouth daily. 09/10/14   [name], DO  metFORMIN (GLUCOPHAGE) 500 MG tablet Take 1 tablet by mouth once daily WITH BREAKFAST 08/07/14   [name], DO  methocarbamol (ROBAXIN) 500 MG tablet Take 1 tablet (500 mg total) by mouth 3 (three) times daily as needed. 03/05/13   Dayarmys [place] de Gwendalyn Ege, MD  metroNIDAZOLE (FLAGYL) 500 MG tablet Take 1 tablet (500 mg total) by mouth 2 (two) times daily. 03/21/14   Coral Spikes, DO  nitroGLYCERIN (NITROSTAT) 0.4 MG SL tablet Place 1 tablet (0.4 mg total) under the tongue every 5 (five) minutes as needed for chest pain. 04/27/12   Vivi Ferns, MD  omeprazole (PRILOSEC) 40 MG capsule take 1 capsule by mouth once daily    Coral Spikes, DO  pravastatin (PRAVACHOL) 40 MG tablet take 1 tablet by mouth every evening 03/19/14   Coral Spikes, DO  pravastatin (PRAVACHOL) 40 MG tablet take 1 tablet  by mouth every evening 03/19/14   Coral Spikes, DO  risperiDONE (RISPERDAL) 2 MG tablet Take 0.5 tablets (1 mg total) by mouth at bedtime. 10/22/10   [name], MD    Review of Systems:No chills, no sweats, nausea intermittent but related to bad food that she ate.   PE:  BP 130/82 mmHg  Pulse 72  Temp(Src) 98.6 F (37 C) (Oral)  Ht 5\' 4"  (1.626 m)  Wt 146 lb (66.225 kg)  BMI 25.05 kg/m2 Constitutional - NAD, AAOx3,  Heart - RRR, No MGR, normal S1/S2 Lungs - CTA Bilaterally, appropriate rate, unlabored.  Back - Nontender, no CVA tenderness.  Abdomen - Suprapubic tenderness, otherwise ND, S, +BS, No organomegaly.  Pelvic - Not performed.   Results for orders placed or  performed in visit on 09/17/14  HM DIABETES EYE EXAM  Result Value Ref Range   HM Diabetic Eye Exam No Retinopathy No Retinopathy    Assessment and Plan:  1. Frequent urination Pt. With cystitis.  - Urinalysis Dipstick with WBC,  Bacteria, and Few red cells.   2. Cystitis Uncomplicated.  - cephALEXin (KEFLEX) 500 MG capsule; Take 1 capsule (500 mg total) by mouth 4 (four) times daily.  Dispense: 40 capsule; Refill: 0 - Urine culture - Return if symptoms fail to improve or resolve.  - If nausea, vomiting, or back pain develop then please return or go to the ED.

## 2014-12-04 NOTE — Addendum Note (Signed)
Addended by: [name], BONNIE on: 12/04/2014 04:13 PM   Modules accepted: Orders

## 2014-12-04 NOTE — Addendum Note (Signed)
Addended by: Aquilla Hacker on: 12/04/2014 03:50 PM   Modules accepted: Orders

## 2014-12-05 LAB — URINE CULTURE
Colony Count: NO GROWTH
Organism ID, Bacteria: NO GROWTH

## 2014-12-12 ENCOUNTER — Other Ambulatory Visit: Payer: Self-pay | Admitting: Family Medicine

## 2014-12-12 ENCOUNTER — Encounter: Payer: Medicaid Other | Admitting: Family Medicine

## 2014-12-12 ENCOUNTER — Other Ambulatory Visit: Payer: Self-pay | Admitting: *Deleted

## 2014-12-12 DIAGNOSIS — E039 Hypothyroidism, unspecified: Secondary | ICD-10-CM

## 2014-12-12 DIAGNOSIS — I10 Essential (primary) hypertension: Secondary | ICD-10-CM

## 2014-12-12 DIAGNOSIS — N182 Chronic kidney disease, stage 2 (mild): Secondary | ICD-10-CM

## 2014-12-12 DIAGNOSIS — E7800 Pure hypercholesterolemia, unspecified: Secondary | ICD-10-CM

## 2014-12-12 MED ORDER — LEVOTHYROXINE SODIUM 50 MCG PO TABS: 50.0000 ug | ORAL_TABLET | Freq: Every day | ORAL | Status: DC

## 2014-12-18 ENCOUNTER — Other Ambulatory Visit: Payer: Medicaid Other

## 2014-12-18 DIAGNOSIS — N182 Chronic kidney disease, stage 2 (mild): Secondary | ICD-10-CM

## 2014-12-18 DIAGNOSIS — E7800 Pure hypercholesterolemia, unspecified: Secondary | ICD-10-CM

## 2014-12-18 DIAGNOSIS — I10 Essential (primary) hypertension: Secondary | ICD-10-CM

## 2014-12-18 DIAGNOSIS — E039 Hypothyroidism, unspecified: Secondary | ICD-10-CM

## 2014-12-18 NOTE — Progress Notes (Signed)
TSH,FLP,VIT D AND CMP DONE TODAY MARCI HOLDER

## 2014-12-19 ENCOUNTER — Other Ambulatory Visit: Payer: Self-pay | Admitting: Family Medicine

## 2014-12-19 ENCOUNTER — Encounter: Payer: Self-pay | Admitting: Family Medicine

## 2014-12-19 DIAGNOSIS — E559 Vitamin D deficiency, unspecified: Secondary | ICD-10-CM

## 2014-12-19 DIAGNOSIS — I10 Essential (primary) hypertension: Secondary | ICD-10-CM

## 2014-12-19 LAB — TSH: TSH: 0.444 u[IU]/mL (ref 0.350–4.500)

## 2014-12-19 LAB — COMPLETE METABOLIC PANEL WITH GFR
ALT: 12 U/L (ref 0–35)
AST: 15 U/L (ref 0–37)
Albumin: 3.4 g/dL — ABNORMAL LOW (ref 3.5–5.2)
Alkaline Phosphatase: 62 U/L (ref 39–117)
BUN: 15 mg/dL (ref 6–23)
CO2: 27 mEq/L (ref 19–32)
Calcium: 9 mg/dL (ref 8.4–10.5)
Chloride: 100 mEq/L (ref 96–112)
Creat: 0.84 mg/dL (ref 0.50–1.10)
GFR, Est African American: 86 mL/min
GFR, Est Non African American: 74 mL/min
Glucose, Bld: 114 mg/dL — ABNORMAL HIGH (ref 70–99)
Potassium: 3 mEq/L — ABNORMAL LOW (ref 3.5–5.3)
Sodium: 138 mEq/L (ref 135–145)
Total Bilirubin: 0.2 mg/dL (ref 0.2–1.2)
Total Protein: 6.3 g/dL (ref 6.0–8.3)

## 2014-12-19 LAB — LIPID PANEL
Cholesterol: 180 mg/dL (ref 0–200)
HDL: 48 mg/dL (ref >=46)
LDL Cholesterol: 97 mg/dL (ref 0–99)
Total CHOL/HDL Ratio: 3.8 Ratio
Triglycerides: 173 mg/dL — ABNORMAL HIGH (ref <150)
VLDL: 35 mg/dL (ref 0–40)

## 2014-12-19 LAB — VITAMIN D 25 HYDROXY (VIT D DEFICIENCY, FRACTURES): Vit D, 25-Hydroxy: 12 ng/mL — ABNORMAL LOW (ref 30–100)

## 2014-12-19 MED ORDER — VITAMIN D (ERGOCALCIFEROL) 1.25 MG (50000 UNIT) PO CAPS: 50000.0000 [IU] | ORAL_CAPSULE | ORAL | Status: DC

## 2014-12-22 ENCOUNTER — Telehealth: Payer: Self-pay

## 2014-12-22 NOTE — Telephone Encounter (Signed)
Called and informed patient of the instructions concerning Vitamin D levels and potassium. Told her to pick up Vitamin D script from pharmacy and come in a week to have levels checked.[name] Long

## 2014-12-22 NOTE — Telephone Encounter (Signed)
-----   Message from [name], DO sent at 12/19/2014  3:54 PM EDT ----- Called patient to inform of low K and low Vit D.  No answer so levt voicemail.  Vitamin D Rx sent.  Patient should pick up either a multivitamin or OTC potassium to take for the next week.  Recheck BMET in 1 week.  Letter also sent.  Please attempt to call and confirm she received voicemail with instructions.  Thanks! Ashly

## 2014-12-23 ENCOUNTER — Other Ambulatory Visit: Payer: Self-pay | Admitting: *Deleted

## 2014-12-23 MED ORDER — HYDROCHLOROTHIAZIDE 25 MG PO TABS: ORAL_TABLET | ORAL | Status: DC

## 2014-12-31 ENCOUNTER — Encounter: Payer: Medicaid Other | Admitting: Family Medicine

## 2015-01-12 ENCOUNTER — Other Ambulatory Visit: Payer: Self-pay | Admitting: *Deleted

## 2015-01-12 DIAGNOSIS — E039 Hypothyroidism, unspecified: Secondary | ICD-10-CM

## 2015-01-12 MED ORDER — LEVOTHYROXINE SODIUM 50 MCG PO TABS: 50.0000 ug | ORAL_TABLET | Freq: Every day | ORAL | Status: DC

## 2015-01-12 NOTE — Telephone Encounter (Signed)
2 Week supply sent into pharmacy until patient is seen for OV.

## 2015-01-15 ENCOUNTER — Telehealth: Payer: Self-pay | Admitting: Family Medicine

## 2015-01-15 NOTE — Telephone Encounter (Signed)
2 week supply was called in on 7/11.

## 2015-01-15 NOTE — Telephone Encounter (Signed)
Pt called because she will be out of her synthroid before her appointment on 7/21. She is only asking for enough medication to get her to her appointment which would only be 6 days worth. [name] Long

## 2015-01-20 ENCOUNTER — Other Ambulatory Visit: Payer: Self-pay | Admitting: *Deleted

## 2015-01-20 MED ORDER — HYDROCHLOROTHIAZIDE 25 MG PO TABS: ORAL_TABLET | ORAL | Status: DC

## 2015-01-22 ENCOUNTER — Other Ambulatory Visit (HOSPITAL_COMMUNITY)
Admission: RE | Admit: 2015-01-22 | Discharge: 2015-01-22 | Disposition: A | Discharge status: Home / Self Care | Payer: Medicaid Other | Source: Ambulatory Visit | Attending: Family Medicine | Admitting: Family Medicine

## 2015-01-22 ENCOUNTER — Encounter: Payer: Self-pay | Admitting: Family Medicine

## 2015-01-22 ENCOUNTER — Ambulatory Visit (INDEPENDENT_AMBULATORY_CARE_PROVIDER_SITE_OTHER): Payer: Medicaid Other | Admitting: Family Medicine

## 2015-01-22 VITALS — BP 109/71 | HR 76 | Temp 98.3°F | Ht 64.0 in | Wt 148.1 lb

## 2015-01-22 DIAGNOSIS — M159 Polyosteoarthritis, unspecified: Secondary | ICD-10-CM | POA: Diagnosis not present

## 2015-01-22 DIAGNOSIS — Z720 Tobacco use: Secondary | ICD-10-CM | POA: Diagnosis not present

## 2015-01-22 DIAGNOSIS — Z124 Encounter for screening for malignant neoplasm of cervix: Secondary | ICD-10-CM

## 2015-01-22 DIAGNOSIS — F17200 Nicotine dependence, unspecified, uncomplicated: Secondary | ICD-10-CM

## 2015-01-22 DIAGNOSIS — E876 Hypokalemia: Secondary | ICD-10-CM | POA: Diagnosis not present

## 2015-01-22 DIAGNOSIS — Z01419 Encounter for gynecological examination (general) (routine) without abnormal findings: Secondary | ICD-10-CM | POA: Diagnosis not present

## 2015-01-22 DIAGNOSIS — I10 Essential (primary) hypertension: Secondary | ICD-10-CM

## 2015-01-22 DIAGNOSIS — Z0000 Encounter for general adult medical examination without abnormal findings: Secondary | ICD-10-CM | POA: Diagnosis not present

## 2015-01-22 DIAGNOSIS — Z113 Encounter for screening for infections with a predominantly sexual mode of transmission: Secondary | ICD-10-CM | POA: Diagnosis present

## 2015-01-22 LAB — HEMOCCULT GUIAC POC 1CARD (OFFICE): Fecal Occult Blood, POC: NEGATIVE

## 2015-01-22 MED ORDER — MELOXICAM 15 MG PO TABS: 15.0000 mg | ORAL_TABLET | Freq: Every day | ORAL | Status: DC

## 2015-01-22 NOTE — Assessment & Plan Note (Signed)
Pap smear obtained today with co-testing and GC/CT testing

## 2015-01-22 NOTE — Assessment & Plan Note (Signed)
Patient to schedule appt with Dr Valentina Lucks for continued smoking cessation counseling/ management.  Patient is in active phase of smoking cessation.

## 2015-01-22 NOTE — Progress Notes (Signed)
Patient ID: [name] Long, female   DOB: 01/20/1951, 64 y.o.   MRN: [id number]   [name] Long presents to office today for her annual physical examination.  Concerns today include:  Using inhalers irregularly.  Smoking about 5 cigs/day down from 10 cigs/day.  Patient is postmenopausal and has not experienced vaginal bleeding.    Last eye exam: 06/2014 Last dental exam: this year.  Last colonoscopy: does not want done because does not want to be sedated.  Patient to do stool cards and return to clinic. Last mammogram: 09/18/14 normal Last pap smear: unknown Immunizations needed: Zostavax needed Refills needed today:    Past Medical History  Diagnosis Date  . BIPOLAR DISORDER 08/31/2006    Qualifier: Diagnosis of  By: Dorathy Daft MD, Marjory Lies    . CHRONIC KIDNEY DISEASE STAGE II (MILD) 09/14/2009    Annotation: eGFR 64 Qualifier: Diagnosis of  By: Jess Barters MD, [name]    . COPD (chronic obstructive pulmonary disease) 09/23/2010    Diagnosed at [facility] in 2008 (Dr. Annamaria Boots)   . DM (diabetes mellitus) type II controlled with renal manifestation 08/31/2006    Qualifier: Diagnosis of  By: Dorathy Daft MD, Marjory Lies    . HYPERCHOLESTEROLEMIA 08/31/2006    Intolerance to Lipitor OK on Crestor but medicaid no longer covering    . HYPERTENSION, BENIGN SYSTEMIC 08/31/2006    Qualifier: Diagnosis of  By: Dorathy Daft MD, Marjory Lies    . HYPOTHYROIDISM, UNSPECIFIED 08/31/2006    Qualifier: Diagnosis of  By: Dorathy Daft MD, Marjory Lies     History   Social History  . Marital Status: Divorced    Spouse Name: N/A  . Number of Children: N/A  . Years of Education: N/A   Occupational History  . Not on file.   Social History Main Topics  . Smoking status: Current Every Day Smoker -- 0.50 packs/day for 39 years    Types: Cigarettes  . Smokeless tobacco: Not on file  . Alcohol Use: Yes  . Drug Use: No  . Sexual Activity: Not on file   Other Topics Concern  . Not on file   Social History Narrative   Past Surgical History    Procedure Laterality Date  . Cesarean section    . Foot surgery    . Knee surgery Bilateral    No family history on file.  ROS: Review of Systems Constitutional: negative Eyes: negative Ears, nose, mouth, throat, and face: negative Respiratory: positive for chronic bronchitis and cough Cardiovascular: negative Gastrointestinal: negative Genitourinary:negative Integument/breast: negative Hematologic/lymphatic: negative Musculoskeletal:negative Neurological: negative Behavioral/Psych: negative Endocrine: negative Allergic/Immunologic: negative   Physical exam BP 109/71 mmHg  Pulse 76  Temp(Src) 98.3 F (36.8 C) (Oral)  Ht '5\' 4"'  (1.626 m)  Wt 148 lb 1.6 oz (67.178 kg)  BMI 25.41 kg/m2 General appearance: alert, cooperative, appears stated age and no distress Head: Normocephalic, without obvious abnormality, atraumatic Eyes: conjunctivae/corneas clear. PERRL, EOM's intact. Fundi benign. Ears: normal TM's and external ear canals both ears Nose: Nares normal. Septum midline. Mucosa normal. No drainage or sinus tenderness. Throat: lips, mucosa, and tongue normal; teeth and gums normal Neck: no adenopathy, no carotid bruit, no JVD, supple, symmetrical, trachea midline and thyroid not enlarged, symmetric, no tenderness/mass/nodules Back: symmetric, no curvature. ROM normal. No CVA tenderness. Lungs: mild bibasilar crackles, breathing on room air Heart: regular rate and rhythm, S1, S2 normal, no murmur, click, rub or gallop Abdomen: soft, non-tender; bowel sounds normal; no masses,  no organomegaly Pelvic: cervix normal in appearance, external genitalia normal,  no adnexal masses or tenderness, no cervical motion tenderness, rectovaginal septum normal, uterus normal size, shape, and consistency and vagina normal without discharge rectal tone normal, no gross blood visible on manual inspection, no palpable masses Extremities: extremities normal, atraumatic, no cyanosis or  edema Pulses: 2+ and symmetric Skin: Skin color, texture, turgor normal. No rashes or lesions Lymph nodes: Cervical, supraclavicular, and axillary nodes normal. Neurologic: Alert and oriented X 3, normal strength and tone. Normal symmetric reflexes. Normal coordination and gait   Mammogram reviewed  Assessment: Patient with multiple comorbidities here for annual physical exam.   Plan: Please see problem list for individual plan.  [name] M. Lajuana Ripple, DO PGY-1, [place]

## 2015-01-22 NOTE — Assessment & Plan Note (Signed)
BMET and Magnesium ordered today.  Will call pt with results.

## 2015-01-22 NOTE — Patient Instructions (Signed)
Schedule an appointment with Dr Valentina Lucks for smoking cessation options.  I recommend a colonoscopy but if you are not willing to do this PLEASE make sure to do the stool cards and turn them in next week.  Plan to see me back in 3 months for diabetes check.  I will contact you will the results of your labs.  If anything is abnormal, I will call you.  Otherwise, expect a copy to be mailed to you.  Ashly M. Lajuana Ripple, DO PGY-2, [facility]    Colorectal Cancer Screening Colorectal cancer screening is done to detect early disease. Colorectal refers to the colon and rectum. The colon and rectum are located at the end of the large intestine (digestive system), and carry your bowel movements out of the body. Screening may be done even if you are not experiencing symptoms.  Colorectal cancer screening checks for:  Polyps. These are small growths in the lining of the colon that can turn cancerous.  Cancer that is already growing. Cancer is a cluster of abnormal cells that can cause problems in the body. REASONS FOR COLORECTAL CANCER SCREENING  It is common for polyps to form in the lining of the colon, especially in older people. These polyps can be cancerous or become cancerous.  Caught early, colorectal cancer is treatable.  Cancer can be life threatening. Detecting or preventing cancer early can save your life and allow you to enjoy life longer. TYPES OF SCREENING  Fecal occult blood testing. A stool sample is examined for blood in the laboratory.  Sigmoidoscopy. A sigmoidoscope is used to examine the rectum and lower colon. A sigmoidoscope is a flexible tube with a camera that is inserted through your anus to examine your lower rectum.  Colonoscopy. The longer colonoscope is used to examine the entire colon. A colonoscope is also a thin, flexible tube with a camera. This test examines the colon and rectum. Other tests include:  Digital rectal exam.  Barium enema.  Stool DNA  test.  Virtual colonoscopy is the use of computerized X-ray scan (computed tomography, CT) to take X-ray images of your colon. WHO SHOULD HAVE COLORECTAL CANCER SCREENING?  Screening is recommended for all adults aged 67 to 3 years.  Screening is generally done every 5 to 10 years or more frequently if you have a family history or symptoms.  Screening is rarely recommended in adults aged 12 to 21 years. Screening is not recommended in adults aged 66 years and older. Your caregiver may recommend screening at a younger age and more frequent screening if you have:  A history of colorectal cancer or polyps.  Family members with histories of colorectal cancer or polyps.  Inflammatory bowel disease, such as ulcerative colitis or Crohn's disease.  A type of hereditary colon cancer syndrome. Talk with your caregiver about any symptoms, personal and family history. SYMPTOMS OF COLORECTAL CANCER It is important to discuss the following symptoms with your caregiver. These symptoms may be the result of other conditions and may be easily treated:  Rectal bleeding.  Blood in your stool.  Changes in bowel movements (hard or loose stools). These changes may last several weeks.  Abdominal cramping.  Feeling the pressure to have a bowel movement when there is no bowel movement.  Feeling tired or weak.  Unexplained weight loss.  Unexplained low red blood cell count. This may also be called iron deficiency anemia. HOME CARE INSTRUCTIONS   Follow up with your caregiver as directed.  Follow all instructions  for preparation before your test as well as after. PREVENTION  Following healthy lifestyle habits each day can reduce your chance of getting colorectal cancer and many other types of cancer:  Eat a healthy, well-balanced diet rich in fruits and vegetables and low in fats, sugars and cholesterol.  Stay active. Try to exercise at least 4 to 6 times per week for 30 minutes.  Maintain a  healthy weight. Ask your caregiver what a healthy weight range is for you.  Women should only drink 1 alcoholic drink per day. Men should only drink 2 alcoholic drinks per day.  Quit smoking. SEEK MEDICAL CARE IF:   You experience abdominal or rectal symptoms (see Symptoms of Colorectal Cancer).  Your gastrointestinal issues (constipation, diarrhea) do not go away as expected.  You have questions or concerns. FOR MORE INFORMATION  American Academy of Family Physicians www.familydoctor.org  Centers for Disease Control and Prevention http://www.wolf.info/  [place] Preventive Services Task Force www.uspreventiveservicestaskforce.[place] www.cancer.org MAKE SURE YOU:   Understand these instructions.  Will watch your condition.  Will get help right away if you are not doing well or get worse. Always follow up with your caregiver to find out the results of your tests. Not all test results may be available during your visit. If your test results are not back during the visit, make an appointment with your caregiver to find out the results. Do not assume everything is normal if you have not heard from your caregiver or the medical facility. It is important for you to follow up on all of your test results.  Document Released: 12/08/2009 Document Revised: 09/12/2011 Document Reviewed: 09/26/2013 [facility] Patient Information 2015 [place], [place]. This information is not intended to replace advice given to you by your health care provider. Make sure you discuss any questions you have with your health care provider.

## 2015-01-22 NOTE — Assessment & Plan Note (Signed)
Controlled. -Continue BP medications as directed.

## 2015-01-23 ENCOUNTER — Telehealth: Payer: Self-pay | Admitting: Family Medicine

## 2015-01-23 LAB — BASIC METABOLIC PANEL
BUN: 10 mg/dL (ref 6–23)
CO2: 27 mEq/L (ref 19–32)
Calcium: 8.9 mg/dL (ref 8.4–10.5)
Chloride: 100 mEq/L (ref 96–112)
Creat: 0.96 mg/dL (ref 0.50–1.10)
Glucose, Bld: 108 mg/dL — ABNORMAL HIGH (ref 70–99)
Potassium: 3.3 mEq/L — ABNORMAL LOW (ref 3.5–5.3)
Sodium: 141 mEq/L (ref 135–145)

## 2015-01-23 LAB — MAGNESIUM: Magnesium: 1.9 mg/dL (ref 1.5–2.5)

## 2015-01-23 NOTE — Telephone Encounter (Signed)
Discussed K level with patient. It is improving.  Recommended resuming K supplementation.  Patient voices understanding.  Ashly M. Lajuana Ripple, DO PGY-2, [place]

## 2015-01-26 ENCOUNTER — Other Ambulatory Visit: Payer: Self-pay | Admitting: Family Medicine

## 2015-01-26 LAB — CYTOLOGY - PAP

## 2015-02-03 ENCOUNTER — Other Ambulatory Visit: Payer: Self-pay | Admitting: *Deleted

## 2015-02-03 DIAGNOSIS — E039 Hypothyroidism, unspecified: Secondary | ICD-10-CM

## 2015-02-03 MED ORDER — LEVOTHYROXINE SODIUM 50 MCG PO TABS: 50.0000 ug | ORAL_TABLET | Freq: Every day | ORAL | Status: DC

## 2015-02-03 MED ORDER — OMEPRAZOLE 40 MG PO CPDR: 40.0000 mg | DELAYED_RELEASE_CAPSULE | Freq: Every day | ORAL | Status: DC

## 2015-02-06 ENCOUNTER — Telehealth: Payer: Self-pay | Admitting: Family Medicine

## 2015-02-06 NOTE — Telephone Encounter (Signed)
Pt calling and needs referral placed to Dr. Flossie Dibble at [facility] for her appt on  03/17/15 @ 2:15. Address: [address], [place], [place] [address]; [phone] Fonda Kinder, ASA

## 2015-02-09 ENCOUNTER — Other Ambulatory Visit: Payer: Self-pay | Admitting: Family Medicine

## 2015-02-09 ENCOUNTER — Telehealth: Payer: Self-pay | Admitting: Family Medicine

## 2015-02-09 DIAGNOSIS — M159 Polyosteoarthritis, unspecified: Secondary | ICD-10-CM

## 2015-02-09 NOTE — Telephone Encounter (Signed)
Pt called because she was thinking that the doctor was going to call in some Flagyl for her yeast infection. jw

## 2015-02-09 NOTE — Telephone Encounter (Signed)
IF patient is having yeast infection, she needs to be seen for an Rx.  Flagyl is not an appropriate medication for yeast.  Please advise patient.

## 2015-02-09 NOTE — Telephone Encounter (Signed)
Pt called to check on the referral request to see Dr. French Ana. jw

## 2015-02-09 NOTE — Telephone Encounter (Signed)
Done

## 2015-02-10 NOTE — Telephone Encounter (Signed)
Contacted pt and informed her of below and she scheduled on Dr. Lajuana Ripple same day on 02/26/2015. Katharina Caper, April D, [place]

## 2015-02-23 ENCOUNTER — Other Ambulatory Visit: Payer: Self-pay | Admitting: *Deleted

## 2015-02-23 MED ORDER — HYDROCHLOROTHIAZIDE 25 MG PO TABS: ORAL_TABLET | ORAL | Status: DC

## 2015-02-26 ENCOUNTER — Ambulatory Visit (INDEPENDENT_AMBULATORY_CARE_PROVIDER_SITE_OTHER): Payer: Medicaid Other | Admitting: Family Medicine

## 2015-02-26 VITALS — BP 107/66 | HR 72 | Temp 97.7°F | Wt 146.0 lb

## 2015-02-26 DIAGNOSIS — A5901 Trichomonal vulvovaginitis: Secondary | ICD-10-CM | POA: Diagnosis not present

## 2015-02-26 DIAGNOSIS — M159 Polyosteoarthritis, unspecified: Secondary | ICD-10-CM | POA: Diagnosis not present

## 2015-02-26 DIAGNOSIS — N898 Other specified noninflammatory disorders of vagina: Secondary | ICD-10-CM

## 2015-02-26 DIAGNOSIS — I10 Essential (primary) hypertension: Secondary | ICD-10-CM | POA: Diagnosis not present

## 2015-02-26 DIAGNOSIS — J449 Chronic obstructive pulmonary disease, unspecified: Secondary | ICD-10-CM

## 2015-02-26 LAB — POCT WET PREP (WET MOUNT)
Clue Cells Wet Prep Whiff POC: NEGATIVE
WBC, Wet Prep HPF POC: >20

## 2015-02-26 MED ORDER — BECLOMETHASONE DIPROPIONATE 80 MCG/ACT IN AERS: 1.0000 | INHALATION_SPRAY | Freq: Every day | RESPIRATORY_TRACT | Status: DC

## 2015-02-26 MED ORDER — METRONIDAZOLE 500 MG PO TABS: 500.0000 mg | ORAL_TABLET | Freq: Two times a day (BID) | ORAL | Status: DC

## 2015-02-26 MED ORDER — PATADAY 0.2 % OP SOLN: 1.0000 [drp] | Freq: Every day | OPHTHALMIC | Status: DC

## 2015-02-26 MED ORDER — ALBUTEROL SULFATE HFA 108 (90 BASE) MCG/ACT IN AERS: 2.0000 | INHALATION_SPRAY | RESPIRATORY_TRACT | Status: DC | PRN

## 2015-02-26 MED ORDER — NITROGLYCERIN 0.4 MG SL SUBL: 0.4000 mg | SUBLINGUAL_TABLET | SUBLINGUAL | Status: DC | PRN

## 2015-02-26 MED ORDER — MELOXICAM 15 MG PO TABS: 15.0000 mg | ORAL_TABLET | Freq: Every day | ORAL | Status: DC

## 2015-02-26 NOTE — Progress Notes (Signed)
Patient ID: [name] Long, female   DOB: 1950-09-18, 64 y.o.   MRN: [id number]    Subjective: CC: vaginal discharge HPI: Patient is a 64 y.o. female presenting to clinic today for same day appt. Concerns today include:  1. Vaginal discharge Patient reports that vaginal odor (fishy) and discharge (white milky) started about 1.5 weeks ago.  Denies pruritis. No douches.  Uses feminine wash daily.  No dysuria, hematuria, vaginal bleeding.  Patient is sexually active.  Social History Reviewed: current smoker. 1/2 ppd FamHx and MedHx updated.  Please see EMR.  ROS: All other systems reviewed and are negative.  Objective: Office vital signs reviewed. BP 107/66 mmHg  Pulse 72  Temp(Src) 97.7 F (36.5 C) (Oral)  Wt 146 lb (66.225 kg)  Physical Examination:  General: Awake, alert, well nourished, NAD HEENT: Normal, EOMI, MMM Cardio: RRR, S1S2 heard, no murmurs appreciated Pulm: normal WOB GU: atrophic external vaginal mucosa, cervix easily visualized, moderate yellow discharge from cervical os, no bleeding, no cervical motion TTP, no abdominal or pelvic masses appreciated. Abdomen: NT/ND  Results for orders placed or performed in visit on 02/26/15 (from the past 24 hour(s))  POCT Wet Prep [facility] [place])     Status: Abnormal   Collection Time: 02/26/15  3:36 PM  Result Value Ref Range   Source Wet Prep POC VAG    WBC, Wet Prep HPF POC >20    Bacteria Wet Prep HPF POC Moderate (A) None, Few   Clue Cells Wet Prep HPF POC None None   Clue Cells Wet Prep Whiff POC Negative Whiff    Yeast Wet Prep HPF POC None    Trichomonas Wet Prep HPF POC MANY    Assessment/Plan: 64 y.o. female   1. Vaginal discharge - POCT Wet Prep [facility])  2. Trichomonal vaginitis - Flagyl 500mg  BID x7 days - Patient's partner needs treatment.  Safe sex counseling provided. - Patient to abstain from intercourse for at least 2 weeks following treatment.  3. HYPERTENSION, BENIGN SYSTEMIC, no CP.  Needs  renewal on Nitro as current bottle is 63 years old. - nitroGLYCERIN (NITROSTAT) 0.4 MG SL tablet; Place 1 tablet (0.4 mg total) under the tongue every 5 (five) minutes as needed for chest pain.  Dispense: 50 tablet; Refill: 3  4. Chronic obstructive pulmonary disease, unspecified COPD, unspecified chronic bronchitis type.  Controlled.  Continues to smoke. - albuterol (PROVENTIL HFA) 108 (90 BASE) MCG/ACT inhaler; Inhale 2 puffs into the lungs every 4 (four) hours as needed. For wheezing  Dispense: 1 Inhaler; Refill: 6  5. Osteoarthritis of multiple joints, unspecified osteoarthritis type - meloxicam (MOBIC) 15 MG tablet; Take 1 tablet (15 mg total) by mouth daily.  Dispense: 30 tablet; Refill: [place], DO PGY-2, [place]

## 2015-02-26 NOTE — Patient Instructions (Signed)
I will call you with your results.  If you need any antibiotics, I will let you know.  Follow up in 3 months or as needed.  Ashly M. Lajuana Ripple, DO PGY-2, [place]

## 2015-03-25 ENCOUNTER — Other Ambulatory Visit: Payer: Self-pay | Admitting: *Deleted

## 2015-03-25 MED ORDER — METFORMIN HCL 500 MG PO TABS: ORAL_TABLET | ORAL | Status: DC

## 2015-03-25 NOTE — Telephone Encounter (Signed)
Rx sent in until November.  Please remind patient that she will be due for DM follow up and labs at that tim.

## 2015-04-02 ENCOUNTER — Other Ambulatory Visit: Payer: Self-pay | Admitting: *Deleted

## 2015-04-02 DIAGNOSIS — I10 Essential (primary) hypertension: Secondary | ICD-10-CM

## 2015-04-02 MED ORDER — AMLODIPINE BESYLATE 5 MG PO TABS: 5.0000 mg | ORAL_TABLET | Freq: Every day | ORAL | Status: DC

## 2015-04-06 ENCOUNTER — Other Ambulatory Visit: Payer: Self-pay | Admitting: *Deleted

## 2015-04-06 DIAGNOSIS — E785 Hyperlipidemia, unspecified: Secondary | ICD-10-CM

## 2015-04-06 MED ORDER — PRAVASTATIN SODIUM 40 MG PO TABS: ORAL_TABLET | ORAL | Status: DC

## 2015-04-20 ENCOUNTER — Telehealth: Payer: Self-pay | Admitting: *Deleted

## 2015-04-20 ENCOUNTER — Ambulatory Visit: Payer: Medicaid Other | Admitting: Family Medicine

## 2015-04-20 NOTE — Telephone Encounter (Signed)
Please approve for 12 refills

## 2015-04-20 NOTE — Telephone Encounter (Signed)
Received fax for refills on accu check aviva plus test strips. Jazmin Hartsell,CMA

## 2015-04-21 ENCOUNTER — Other Ambulatory Visit: Payer: Self-pay | Admitting: Family Medicine

## 2015-04-21 DIAGNOSIS — E119 Type 2 diabetes mellitus without complications: Secondary | ICD-10-CM

## 2015-04-21 MED ORDER — GLUCOSE BLOOD VI STRP: ORAL_STRIP | Status: DC

## 2015-04-21 NOTE — Telephone Encounter (Signed)
Done

## 2015-04-21 NOTE — Telephone Encounter (Signed)
A new script will have to be placed since it needs to be signed.  Jazmin Hartsell,CMA

## 2015-05-01 ENCOUNTER — Ambulatory Visit: Payer: Medicaid Other | Admitting: Family Medicine

## 2015-05-06 ENCOUNTER — Ambulatory Visit (INDEPENDENT_AMBULATORY_CARE_PROVIDER_SITE_OTHER): Payer: Medicaid Other | Admitting: Family Medicine

## 2015-05-06 ENCOUNTER — Encounter: Payer: Self-pay | Admitting: Family Medicine

## 2015-05-06 VITALS — BP 108/76 | HR 73 | Temp 98.2°F | Ht 64.0 in | Wt 147.0 lb

## 2015-05-06 DIAGNOSIS — F329 Major depressive disorder, single episode, unspecified: Secondary | ICD-10-CM

## 2015-05-06 DIAGNOSIS — E559 Vitamin D deficiency, unspecified: Secondary | ICD-10-CM

## 2015-05-06 DIAGNOSIS — M545 Low back pain, unspecified: Secondary | ICD-10-CM

## 2015-05-06 DIAGNOSIS — R5383 Other fatigue: Secondary | ICD-10-CM | POA: Diagnosis not present

## 2015-05-06 DIAGNOSIS — E876 Hypokalemia: Secondary | ICD-10-CM | POA: Diagnosis not present

## 2015-05-06 DIAGNOSIS — R4589 Other symptoms and signs involving emotional state: Secondary | ICD-10-CM

## 2015-05-06 LAB — CBC
HCT: 40.9 % (ref 36.0–46.0)
Hemoglobin: 14.4 g/dL (ref 12.0–15.0)
MCH: 36.4 pg — ABNORMAL HIGH (ref 26.0–34.0)
MCHC: 35.2 g/dL (ref 30.0–36.0)
MCV: 103.3 fL — ABNORMAL HIGH (ref 78.0–100.0)
MPV: 10.4 fL (ref 8.6–12.4)
Platelets: 233 10*3/uL (ref 150–400)
RBC: 3.96 MIL/uL (ref 3.87–5.11)
RDW: 14.7 % (ref 11.5–15.5)
WBC: 7.5 10*3/uL (ref 4.0–10.5)

## 2015-05-06 LAB — POCT URINALYSIS DIPSTICK
Bilirubin, UA: NEGATIVE
Blood, UA: NEGATIVE
Glucose, UA: NEGATIVE
Ketones, UA: NEGATIVE
Leukocytes, UA: NEGATIVE
Nitrite, UA: NEGATIVE
Protein, UA: NEGATIVE
Spec Grav, UA: 1.01
Urobilinogen, UA: 0.2
pH, UA: 6.5

## 2015-05-06 LAB — BASIC METABOLIC PANEL WITH GFR
BUN: 10 mg/dL (ref 7–25)
CO2: 32 mmol/L — ABNORMAL HIGH (ref 20–31)
Calcium: 8.9 mg/dL (ref 8.6–10.4)
Chloride: 86 mmol/L — ABNORMAL LOW (ref 98–110)
Creat: 0.81 mg/dL (ref 0.50–0.99)
GFR, Est African American: 89 mL/min (ref >=60)
GFR, Est Non African American: 78 mL/min (ref >=60)
Glucose, Bld: 77 mg/dL (ref 65–99)
Potassium: 2.9 mmol/L — ABNORMAL LOW (ref 3.5–5.3)
Sodium: 128 mmol/L — ABNORMAL LOW (ref 135–146)

## 2015-05-06 LAB — MAGNESIUM: Magnesium: 1.8 mg/dL (ref 1.5–2.5)
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
## 2015-05-06 NOTE — Patient Instructions (Signed)
I will contact you with the results of your labs.  If your weakness gets worse, you develop fevers, chills, palpitations, please seek immediate medical care.  Weakness Weakness is a lack of strength. You may feel weak all over your body or just in one part of your body. Weakness can be serious. In some cases, you may need more medical tests. HOME [place] a well-balanced diet.  Try to exercise every day.  Only take medicines as told by your doctor. GET HELP RIGHT AWAY IF:   You cannot do your normal daily activities.  You cannot walk up and down stairs, or you feel very tired when you do so.  You have shortness of breath or chest pain.  You have trouble moving parts of your body.  You have weakness in only one body part or on only one side of the body.  You have a fever.  You have trouble speaking or swallowing.  You cannot control when you pee (urinate) or poop (bowel movement).  You have black or bloody throw up (vomit) or poop.  Your weakness gets worse or spreads to other body parts.  You have new aches or pains. MAKE SURE YOU:   Understand these instructions.  Will watch your condition.  Will get help right away if you are not doing well or get worse.   This information is not intended to replace advice given to you by your health care provider. Make sure you discuss any questions you have with your health care provider.   Document Released: 06/02/2008 Document Revised: 12/20/2011 Document Reviewed: 08/19/2011 Elsevier Interactive Patient Education [employer].

## 2015-05-07 ENCOUNTER — Telehealth: Payer: Self-pay | Admitting: Family Medicine

## 2015-05-07 ENCOUNTER — Other Ambulatory Visit: Payer: Self-pay | Admitting: Family Medicine

## 2015-05-07 DIAGNOSIS — E876 Hypokalemia: Secondary | ICD-10-CM

## 2015-05-07 LAB — TSH: TSH: 0.468 u[IU]/mL (ref 0.350–4.500)

## 2015-05-07 LAB — VITAMIN D 25 HYDROXY (VIT D DEFICIENCY, FRACTURES): Vit D, 25-Hydroxy: 30 ng/mL (ref 30–100)

## 2015-05-07 MED ORDER — POTASSIUM CHLORIDE ER 10 MEQ PO TBCR: EXTENDED_RELEASE_TABLET | ORAL | Status: DC

## 2015-05-07 NOTE — Telephone Encounter (Signed)
Called to discuss K.  KDur sent to pharmacy. 20 Meq TWICE A DAY x1 day, then 10 meq daily.  Repeat BMET on Monday.  Ashly M. Lajuana Ripple, DO PGY-2, [place]

## 2015-05-08 NOTE — Telephone Encounter (Signed)
To MD for clarification.  Do we need to continue to attempt call?

## 2015-05-08 NOTE — Telephone Encounter (Signed)
No, I have talked to patient.

## 2015-05-11 ENCOUNTER — Telehealth: Payer: Self-pay | Admitting: Family Medicine

## 2015-05-11 NOTE — Telephone Encounter (Signed)
Expressed that she should take this ASAP and have BMET on Wednesday.  She reports that she cannot come this week for labs, as she is trying to limit trips because they are tolling on her arthritis.  Expressed that hypokalemia can be life threatening and that getting labs later would be AMA.  Patient acknowledges this and will come as soon as able.  Emergent symptom precautions reviewed.  Patient voices good understanding  Ashly M. Lajuana Ripple, DO PGY-2, [place]

## 2015-05-11 NOTE — Telephone Encounter (Signed)
Patient calls to report that she is feeling much better.  She reports that she has not picked up the [place] yet.

## 2015-05-11 NOTE — Telephone Encounter (Signed)
Spoke to patient.  See note in EMR.

## 2015-05-11 NOTE — Telephone Encounter (Signed)
Pt has a new phone number and would like to talk to dr [phone]

## 2015-06-30 ENCOUNTER — Ambulatory Visit: Payer: Medicaid Other | Admitting: Family Medicine

## 2015-07-07 ENCOUNTER — Telehealth: Payer: Self-pay | Admitting: Family Medicine
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
## 2015-07-09 ENCOUNTER — Ambulatory Visit: Payer: Medicaid Other | Admitting: Family Medicine

## 2015-07-20 ENCOUNTER — Other Ambulatory Visit: Payer: Self-pay | Admitting: *Deleted

## 2015-07-20 DIAGNOSIS — E876 Hypokalemia: Secondary | ICD-10-CM

## 2015-07-20 NOTE — Telephone Encounter (Signed)
Please call patient to schedule an office visit.  She was supposed to come back in for potassium check 2 months ago.  Will refill potassium once this is done.

## 2015-07-21 NOTE — Telephone Encounter (Signed)
Patient is scheduled for 07/24/15. Jazmin Hartsell,CMA

## 2015-07-24 ENCOUNTER — Ambulatory Visit: Payer: Medicaid Other | Admitting: Family Medicine

## 2015-08-10 ENCOUNTER — Telehealth: Payer: Self-pay | Admitting: Family Medicine

## 2015-08-10 ENCOUNTER — Other Ambulatory Visit: Payer: Self-pay

## 2015-08-10 DIAGNOSIS — R460 Very low level of personal hygiene: Secondary | ICD-10-CM

## 2015-08-10 DIAGNOSIS — IMO0001 Reserved for inherently not codable concepts without codable children: Principal | ICD-10-CM

## 2015-08-10 DIAGNOSIS — Z1231 Encounter for screening mammogram for malignant neoplasm of breast: Secondary | ICD-10-CM

## 2015-08-10 DIAGNOSIS — Z789 Other specified health status: Secondary | ICD-10-CM | POA: Insufficient documentation

## 2015-08-10 NOTE — Telephone Encounter (Signed)
Discussed PCS form with patient.  She notes that she had this previously but that her company has gone out of business and she will be getting PCS from a new one.  She reports having OA and neck pain that limits her ability to get around safely and limits her ability to bathe.  She utilizes a shower chair and has an aide come help her with bathing and other hygiene practices.  NO difficulty toileting, self feeding, dressing at this time.  PCS form completed and placed up front to fax.  Additionally, patient to call with detailed needs for letter.  We discussed having missed last 2 appointments, during which time this was supposed to be discussed.  She will call shortly.  Ashly M. Lajuana Ripple, DO PGY-2, [place]

## 2015-08-14 NOTE — Telephone Encounter (Signed)
PCS form was not received by [facility].  Form was found and faxed again to [phone]

## 2015-09-07 ENCOUNTER — Other Ambulatory Visit: Payer: Self-pay | Admitting: *Deleted

## 2015-09-07 MED ORDER — METFORMIN HCL 500 MG PO TABS: ORAL_TABLET | ORAL | Status: DC

## 2015-09-11 ENCOUNTER — Telehealth: Payer: Self-pay | Admitting: Family Medicine

## 2015-09-11 NOTE — Telephone Encounter (Signed)
Pt called because she would like some Mobic called in for the inflammation in her ribs. She got this from all the coughing so much when she was sick. jw

## 2015-09-14 NOTE — Telephone Encounter (Signed)
Patient needs labs to have her potassium checked before this is refilled.  Please advise patient.

## 2015-09-14 NOTE — Telephone Encounter (Signed)
Pt informed, but states " I don't feel like it today, I'll call you back". [place], Salome Spotted, CMA

## 2015-09-18 ENCOUNTER — Other Ambulatory Visit: Payer: Self-pay | Admitting: Family Medicine

## 2015-09-18 ENCOUNTER — Other Ambulatory Visit: Payer: Self-pay | Admitting: *Deleted

## 2015-09-18 DIAGNOSIS — M159 Polyosteoarthritis, unspecified: Secondary | ICD-10-CM

## 2015-09-18 MED ORDER — MELOXICAM 15 MG PO TABS: 15.0000 mg | ORAL_TABLET | Freq: Every day | ORAL | Status: DC

## 2015-09-18 NOTE — Telephone Encounter (Signed)
Needs refill on HCTZ.  Pharmacy is [facility] on [address].  She would like to get this over the weekend. She says [facility] says they have been sending the request several times since Wed

## 2015-09-20 NOTE — Telephone Encounter (Signed)
Per my previous phone note, patient with hypokalemia.  Will need to have potassium rechecked before this is authorized.  She noted on last phone encounter that she did not feel like having her blood work done.  Continuing this medication if her potassium is low can hurt her.  Will not refill until she gets blood work done for her safety.  Please call patient to remind her of this.

## 2015-09-21 NOTE — Telephone Encounter (Signed)
Spoke to pt. Explained info below. She asked if the Dr knew that her appt would not be until the 70 th. Pt was informed that she could come into the office for a lab only appt. Patient hung up the phone. Ottis Stain, CMA

## 2015-09-29 ENCOUNTER — Encounter: Payer: Self-pay | Admitting: Family Medicine

## 2015-09-29 ENCOUNTER — Ambulatory Visit (INDEPENDENT_AMBULATORY_CARE_PROVIDER_SITE_OTHER): Payer: Medicaid Other | Admitting: Family Medicine

## 2015-09-29 VITALS — BP 170/90 | HR 80 | Temp 98.6°F | Ht 64.0 in | Wt 143.0 lb

## 2015-09-29 DIAGNOSIS — R0781 Pleurodynia: Secondary | ICD-10-CM

## 2015-09-29 DIAGNOSIS — E876 Hypokalemia: Secondary | ICD-10-CM

## 2015-09-29 DIAGNOSIS — I10 Essential (primary) hypertension: Secondary | ICD-10-CM | POA: Diagnosis present

## 2015-09-29 DIAGNOSIS — R0789 Other chest pain: Secondary | ICD-10-CM

## 2015-09-29 DIAGNOSIS — J302 Other seasonal allergic rhinitis: Secondary | ICD-10-CM

## 2015-09-29 MED ORDER — FLUTICASONE PROPIONATE 50 MCG/ACT NA SUSP: 2.0000 | Freq: Every day | NASAL | Status: DC

## 2015-09-29 MED ORDER — PATADAY 0.2 % OP SOLN: 1.0000 [drp] | Freq: Every day | OPHTHALMIC | Status: DC

## 2015-09-29 NOTE — Progress Notes (Signed)
    Subjective: CC: HTN, hypokalemia HPI: [name] Long is a 65 y.o. female presenting to clinic today for office visit. Concerns today include:  1. Hypertension Blood pressure at home: 110/80 Blood pressure today: 170/90 Meds: Compliant with Norvasc.  Has been out of HCTZ for about 2 weeks. Side effects: none ROS: Denies headache, dizziness, visual changes, nausea, vomiting, chest pain, abdominal pain or shortness of breath.  2. Allergies Patient reports that she has had nasal congestion and watery eyes.  No fevers since recent URI.  Was doing well on Pataday and Flonase.  Needs refills on both of these.  3. Hypokalemia Patient notes that she continues to have occ cramping in her LE.  No palpitations, weakness, malaise.  Is not taking potassium daily but is trying to supplement through diet.  4. Rib pain Patient notes that she continues to have intermittent rib pain.  She reports that it was exacerbated by recent URI, when she was coughing frequently.  Mobic helps this.  She has not done PT for this.  She is interested in it though.  No pain with breathing.  Social History Reviewed: active smoker. FamHx and MedHx reviewed.  Please see EMR. Health Maintenance: Hep C screen, colonoscopy, urine microalbumin, lipid panel  ROS: Per HPI  Objective: Office vital signs reviewed. BP 170/90 mmHg  Pulse 80  Temp(Src) 98.6 F (37 C) (Oral)  Ht 5\' 4"  (1.626 m)  Wt 143 lb (64.864 kg)  BMI 24.53 kg/m2  SpO2 99%  Physical Examination:  General: Awake, alert, well nourished, No acute distress HEENT: Normal    Neck: No masses palpated. No lymphadenopathy    Ears: Tympanic membranes intact, normal light reflex, no erythema, no bulging    Eyes: PERRLA, EOMI    Nose: nasal turbinates moist, edematous    Throat: moist mucus membranes, no erythema Cardio: regular rate and rhythm, S1S2 heard, no murmurs appreciated Pulm: clear to auscultation bilaterally, no wheezes, rhonchi or  rales MSK: Normal gait and station. No palpable abnormalities to ribs.  Assessment/ Plan: 65 y.o. female   1. HYPERTENSION, BENIGN SYSTEMIC, uncontrolled.  Compliant with Norvasc.  Has not used HCTZ in 2 weeks. - HCTZ refilled - Continue Norvasc - Patient instructed to schedule RN appt for BP check in next week - Plan for urine microalbumin at next visit  2. Hypokalemia - Consider starting daily potassium supplement - BASIC METABOLIC PANEL WITH GFR  3. Rib pain on left side - Continue Mobic for now.   - Ambulatory referral to Sports Medicine - Ambulatory referral to Physical Therapy  4. Other seasonal allergic rhinitis - Pataday and Flonase refilled  Schedule follow up for annual exam in July   [name], DO PGY-2, [place]

## 2015-09-29 NOTE — Patient Instructions (Signed)
I will contact you will the results of your labs.  If anything is abnormal, I will call you.  Otherwise, expect a copy to be mailed to you.  

## 2015-09-30 ENCOUNTER — Ambulatory Visit: Payer: Medicaid Other

## 2015-09-30 LAB — BASIC METABOLIC PANEL WITH GFR
BUN: 9 mg/dL (ref 7–25)
CO2: 28 mmol/L (ref 20–31)
Calcium: 8.8 mg/dL (ref 8.6–10.4)
Chloride: 101 mmol/L (ref 98–110)
Creat: 1.07 mg/dL — ABNORMAL HIGH (ref 0.50–0.99)
GFR, Est African American: 63 mL/min (ref >=60)
GFR, Est Non African American: 55 mL/min — ABNORMAL LOW (ref >=60)
Glucose, Bld: 95 mg/dL (ref 65–99)
Potassium: 3.7 mmol/L (ref 3.5–5.3)
Sodium: 140 mmol/L (ref 135–146)

## 2015-10-01 ENCOUNTER — Encounter: Payer: Self-pay | Admitting: Family Medicine

## 2015-10-01 ENCOUNTER — Other Ambulatory Visit: Payer: Self-pay | Admitting: Family Medicine

## 2015-10-01 MED ORDER — HYDROCHLOROTHIAZIDE 25 MG PO TABS: ORAL_TABLET | ORAL | Status: DC

## 2015-10-01 NOTE — Telephone Encounter (Signed)
Pt is calling and needs her hydrochlorothiazide called in. She was seen yesterday by her doctor but the medication was not called in. She is out and has been out. Please call in right away. jw

## 2015-10-01 NOTE — Telephone Encounter (Signed)
Patient has called several times today for HCTZ refill.  Per PCP office note medication was going to be refilled.  Medication sent in to patient's pharmacy. Derl Barrow, RN

## 2015-10-06 ENCOUNTER — Telehealth: Payer: Self-pay | Admitting: Family Medicine

## 2015-10-06 NOTE — Telephone Encounter (Signed)
Need to call patient and give results from letter sent.  Patient haven't received it yet.  Want to know results now.

## 2015-10-06 NOTE — Telephone Encounter (Signed)
You may call her and let her know that her potassium was normal.  Her Creatinine (kidney function) was a little bumped.  I recommend that she avoid NSAIDs, including her Mobic if she can.  Follow up with sports medicine/ ortho as scheduled.

## 2015-10-07 ENCOUNTER — Ambulatory Visit: Payer: Medicaid Other | Admitting: Family Medicine

## 2015-10-07 NOTE — Telephone Encounter (Addendum)
Spoke to pt. Gave her the information below. She said she has been taking a lot of advil so she will stop. She hasn't taken the Mobic since you told her to stop. She is feeling a little better but still "not out of the woods". Going to reschedule sports med appointment for today as she isn't quite feeling like going anywhere. Ottis Stain, CMA

## 2015-10-15 ENCOUNTER — Ambulatory Visit: Payer: Medicaid Other | Admitting: Family Medicine

## 2015-10-19 ENCOUNTER — Other Ambulatory Visit: Payer: Self-pay | Admitting: Family Medicine

## 2015-10-19 ENCOUNTER — Encounter: Payer: Self-pay | Admitting: Family Medicine

## 2015-10-19 ENCOUNTER — Telehealth: Payer: Self-pay | Admitting: *Deleted

## 2015-10-19 ENCOUNTER — Ambulatory Visit (INDEPENDENT_AMBULATORY_CARE_PROVIDER_SITE_OTHER): Payer: Medicaid Other | Admitting: Family Medicine

## 2015-10-19 VITALS — BP 110/62 | HR 82 | Temp 98.3°F | Ht 64.0 in | Wt 144.7 lb

## 2015-10-19 DIAGNOSIS — E119 Type 2 diabetes mellitus without complications: Secondary | ICD-10-CM

## 2015-10-19 DIAGNOSIS — A599 Trichomoniasis, unspecified: Secondary | ICD-10-CM | POA: Diagnosis not present

## 2015-10-19 DIAGNOSIS — J449 Chronic obstructive pulmonary disease, unspecified: Secondary | ICD-10-CM

## 2015-10-19 DIAGNOSIS — N898 Other specified noninflammatory disorders of vagina: Secondary | ICD-10-CM | POA: Diagnosis not present

## 2015-10-19 LAB — POCT WET PREP (WET MOUNT): Clue Cells Wet Prep Whiff POC: NEGATIVE

## 2015-10-19 LAB — POCT GLYCOSYLATED HEMOGLOBIN (HGB A1C): Hemoglobin A1C: 6.3

## 2015-10-19 MED ORDER — METRONIDAZOLE 500 MG PO TABS
2000.0000 mg | ORAL_TABLET | Freq: Once | ORAL | Status: AC
  Administered 2015-10-19: 2000 mg via ORAL

## 2015-10-19 MED ORDER — ALBUTEROL SULFATE HFA 108 (90 BASE) MCG/ACT IN AERS: 2.0000 | INHALATION_SPRAY | RESPIRATORY_TRACT | Status: DC | PRN

## 2015-10-19 NOTE — Progress Notes (Signed)
Patient ID: [name] Long, female   DOB: 05-13-1951, 65 y.o.   MRN: [id number]   Subjective:   [name] Long is a 65 y.o. female with a history of HTN, COPD, T2DM, Tobacco use  Here for  Chief Complaint  Patient presents with  . Vaginal Discharge   Started 1 week ago. Copious milky/yello discharge. Reports odor and burning. Denies itching. Reports she had sex 3 weeks ago with the same partner that given her trichamonas last year. They had not had sex for over 1 year. She reports he was not treated last year because "his doctor said everything was fine."   Review of records shows trich infection in 2016. Treated with metronidazole. GC CT results from 01/2015 reviewed and were negative.   Health Maintenance Due  Topic Date Due  . Hepatitis C Screening  07-21-1950  . COLONOSCOPY  07/24/2001  . ZOSTAVAX  07/25/2011  . URINE MICROALBUMIN  08/17/2012  . FOOT EXAM  05/30/2013  . HEMOGLOBIN A1C  09/19/2014  . LIPID PANEL  06/19/2015  . OPHTHALMOLOGY EXAM  08/29/2015    Review of Systems:   Per HPI. All other systems reviewed and are negative expect as in HPI   PMH, PSH, Medications, Allergies, SocialHx and FHx reviewed and updated in EMR- marked as reviewed 10/19/2015   Objective:  BP 110/62 mmHg  Pulse 82  Temp(Src) 98.3 F (36.8 C) (Oral)  Ht 5\' 4"  (1.626 m)  Wt 144 lb 11.2 oz (65.635 kg)  BMI 24.83 kg/m2  SpO2 97%  Gen:  65 y.o. female in NAD. Speaking in full sentences. Good eye contact HEENT: NCAT, MMM, EOMI, PERRL, anicteric sclerae.  CV: RRR, no MRG, no JVD Resp: Normal WOB. Non-labored, CTAB, no wheezes noted GI: Soft, NTND, BS present, no guarding or organomegaly Ext: WWP, no edema MSK: Intact gait. Full ROM Neuro: Alert and oriented, speech normal Pysch: Normal mood and affect.   Results for orders placed or performed in visit on 10/19/15 (from the past 48 hour(s))  HgB A1c     Status: None   Collection Time: 10/19/15  2:57 PM  Result Value Ref Range   Hemoglobin A1C 6.3   POCT Wet Prep [facility])     Status: Abnormal   Collection Time: 10/19/15  3:07 PM  Result Value Ref Range   Source Wet Prep POC VAG    WBC, Wet Prep HPF POC 5-10    Bacteria Wet Prep HPF POC Moderate (A) None, Few, Too numerous to count   Clue Cells Wet Prep HPF POC None None, Too numerous to count   Clue Cells Wet Prep Whiff POC Negative Whiff    Yeast Wet Prep HPF POC None    Trichomonas Wet Prep HPF POC MODERATE      Lab Results  Component Value Date   HGBA1C 6.3 10/19/2015   Assessment:     [name] Long is a 65 y.o. female here for vaginal discharge found to have trichamonas.     Plan:   Trichamonas: Review of records shows trich infection in 2016. Treated with metronidazole. GC CT results from 01/2015 reviewed and were negative.  - recommended testing for GC/CT and patient would prefer to wait until her physical this summer - Metronidazole 2000g PO given in clinic today.  - Follow up for continued sx - Recommend full STI screening with physical including HIV, RPR, GC/CT and patient voiced understanding.   Orders Placed This Encounter  Procedures  . HgB A1c  .  POCT Wet Prep [facility])   Follow up with PCP for routine care.   >50% of this 25 minute visit was spent in with coordination of prompt treatment and  direct counseling about safe sex practices and the nature of trichamonal infections.   Caren Macadam, MD, AB[facility] OB Fellow, [facility] 10/19/2015  3:21 PM

## 2015-10-19 NOTE — Telephone Encounter (Signed)
Received a refill request from [place] for ProAir HFA.  Medication is not listed on med list.  Derl Barrow, RN

## 2015-10-19 NOTE — Patient Instructions (Addendum)
Trichomoniasis Trichomoniasis is an infection caused by an organism called Trichomonas. The infection can affect both women and men. In women, the outer female genitalia and the vagina are affected. In men, the penis is mainly affected, but the prostate and other reproductive organs can also be involved. Trichomoniasis is a sexually transmitted infection (STI) and is most often passed to another person through sexual contact.  RISK FACTORS  Having unprotected sexual intercourse.  Having sexual intercourse with an infected partner. SIGNS AND SYMPTOMS  Symptoms of trichomoniasis in women include:  Abnormal gray-green frothy vaginal discharge.  Itching and irritation of the vagina.  Itching and irritation of the area outside the vagina. Symptoms of trichomoniasis in men include:   Penile discharge with or without pain.  Pain during urination. This results from inflammation of the urethra. DIAGNOSIS  Trichomoniasis may be found during a Pap test or physical exam. Your health care provider may use one of the following methods to help diagnose this infection:  Testing the pH of the vagina with a test tape.  Using a vaginal swab test that checks for the Trichomonas organism. A test is available that provides results within a few minutes.  Examining a urine sample.  Testing vaginal secretions. Your health care provider may test you for other STIs, including HIV. TREATMENT   You may be given medicine to fight the infection. Women should inform their health care provider if they could be or are pregnant. Some medicines used to treat the infection should not be taken during pregnancy.  Your health care provider may recommend over-the-counter medicines or creams to decrease itching or irritation.  Your sexual partner will need to be treated if infected.  Your health care provider may test you for infection again 3 months after treatment. HOME CARE INSTRUCTIONS   Take medicines only as  directed by your health care provider.  Take over-the-counter medicine for itching or irritation as directed by your health care provider.  Do not have sexual intercourse while you have the infection.  Women should not douche or wear tampons while they have the infection.  Discuss your infection with your partner. Your partner may have gotten the infection from you, or you may have gotten it from your partner.  Have your sex partner get examined and treated if necessary.  Practice safe, informed, and protected sex.  See your health care provider for other STI testing. SEEK MEDICAL CARE IF:   You still have symptoms after you finish your medicine.  You develop abdominal pain.  You have pain when you urinate.  You have bleeding after sexual intercourse.  You develop a rash.  Your medicine makes you sick or makes you throw up (vomit). MAKE SURE YOU:  Understand these instructions.  Will watch your condition.  Will get help right away if you are not doing well or get worse.   This information is not intended to replace advice given to you by your health care provider. Make sure you discuss any questions you have with your health care provider.   Document Released: 12/14/2000 Document Revised: 07/11/2014 Document Reviewed: 04/01/2013 Elsevier Interactive Patient Education 2016 [place] Sex Safe sex is about reducing the risk of giving or getting a sexually transmitted disease (STD). STDs are spread through sexual contact involving the genitals, mouth, or rectum. Some STDs can be cured and others cannot. Safe sex can also prevent unintended pregnancies.  WHAT ARE SOME SAFE SEX PRACTICES?  Limit your sexual activity to only  one partner who is having sex with only you.  Talk to your partner about his or her past partners, past STDs, and drug use.  Use a condom every time you have sexual intercourse. This includes vaginal, oral, and anal sexual activity. Both  females and males should wear condoms during oral sex. Only use latex or polyurethane condoms and water-based lubricants. Using petroleum-based lubricants or oils to lubricate a condom will weaken the condom and increase the chance that it will break. The condom should be in place from the beginning to the end of sexual activity. Wearing a condom reduces, but does not completely eliminate, your risk of getting or giving an STD. STDs can be spread by contact with infected body fluids and skin.  Get vaccinated for hepatitis B and HPV.  Avoid alcohol and recreational drugs, which can affect your judgment. You may forget to use a condom or participate in high-risk sex.  For females, avoid douching after sexual intercourse. Douching can spread an infection farther into the reproductive tract.  Check your body for signs of sores, blisters, rashes, or unusual discharge. See your health care provider if you notice any of these signs.  Avoid sexual contact if you have symptoms of an infection or are being treated for an STD. If you or your partner has herpes, avoid sexual contact when blisters are present. Use condoms at all other times.  If you are at risk of being infected with HIV, it is recommended that you take a prescription medicine daily to prevent HIV infection. This is called pre-exposure prophylaxis (PrEP). You are considered at risk if:  You are a man who has sex with other men (MSM).  You are a heterosexual man or woman who is sexually active with more than one partner.  You take drugs by injection.  You are sexually active with a partner who has HIV.  Talk with your health care provider about whether you are at high risk of being infected with HIV. If you choose to begin PrEP, you should first be tested for HIV. You should then be tested every 3 months for as long as you are taking PrEP.  See your health care provider for regular screenings, exams, and tests for other STDs. Before having  sex with a new partner, each of you should be screened for STDs and should talk about the results with each other. WHAT ARE THE BENEFITS OF SAFE SEX?   There is less chance of getting or giving an STD.  You can prevent unwanted or unintended pregnancies.  By discussing safe sex concerns with your partner, you may increase feelings of intimacy, comfort, trust, and honesty between the two of you.   This information is not intended to replace advice given to you by your health care provider. Make sure you discuss any questions you have with your health care provider.   Document Released: 07/28/2004 Document Revised: 07/11/2014 Document Reviewed: 12/12/2011 Elsevier Interactive Patient Education [employer].

## 2015-10-19 NOTE — Telephone Encounter (Signed)
Done.  Listed as proventil.

## 2015-10-21 ENCOUNTER — Encounter: Payer: Self-pay | Admitting: Family Medicine

## 2015-10-21 ENCOUNTER — Ambulatory Visit (INDEPENDENT_AMBULATORY_CARE_PROVIDER_SITE_OTHER): Payer: Medicaid Other | Admitting: Family Medicine

## 2015-10-21 VITALS — BP 137/87 | HR 93 | Ht 64.0 in | Wt 144.0 lb

## 2015-10-21 DIAGNOSIS — S29011A Strain of muscle and tendon of front wall of thorax, initial encounter: Secondary | ICD-10-CM

## 2015-10-21 MED ORDER — NAPROXEN 500 MG PO TABS: ORAL_TABLET | ORAL | Status: DC

## 2015-10-21 NOTE — Progress Notes (Signed)
  [name] Long - 66 y.o. female MRN [id number]  Date of birth: Jan 26, 1951 [name] Long is a 65 y.o. female who presents today for L chest wall pain.  L chest wall pain, initial visit - Pt presents today for left abdominal/chest wall pain is been ongoing now for 1-2 months. Worse with movement she is previously tried Mobic which has helped. Was taken off of this secondary to possible bump in creatinine due to this about 2 weeks ago.  No fevers chills sweats chest pain shortness of breath or pleuritic chest pain. The area is tender to palpation she denies any swelling or erythema or rashes in the area  PMHx - Updated and reviewed.  Contributory factors include: Bipolar disorder PSHx - Updated and reviewed.  Contributory factors include:  B/L knee arthroscopy  FHx - Updated and reviewed.  Contributory factors include:  Denies  Social Hx - Updated and reviewed. Contributory factors include: Current everyday 0.5 ppd smoker Medications - Reviewed.    ROS Per HPI.  12 point negative other than per HPI.   Exam:  Filed Vitals:   10/21/15 1437  BP: 137/87  Pulse: 93   Gen: NAD, AAO 3 Cardio- RRR Pulm - Normal respiratory effort/rate Skin: No rashes or erythema Extremities: No edema  Vascular: pulses +2 bilateral upper and lower extremity Psych: Normal affect  Chest Wall: TTP L 10th axillary line with worsening with SB/R on that side.  Normal respirations.

## 2015-10-21 NOTE — Assessment & Plan Note (Signed)
2/2 cough and smoking.  External oblique strain as pain with twisting and palpation around EOA insertion on 10/11th rib.  Normal respirations, no pleuritic CP, O2 >95%, no radiation.  Continue with rest, naprosyn 500 mg BID (creatinine 1.07 and discussed risk of taking this but will shorten course to 10-14 days), heat to area and gentle massage.  F/U PRN.

## 2015-10-27 ENCOUNTER — Ambulatory Visit
Admission: RE | Admit: 2015-10-27 | Discharge: 2015-10-27 | Disposition: A | Discharge status: Home / Self Care | Payer: Medicaid Other | Source: Ambulatory Visit

## 2015-10-27 DIAGNOSIS — Z1231 Encounter for screening mammogram for malignant neoplasm of breast: Secondary | ICD-10-CM

## 2015-12-21 ENCOUNTER — Other Ambulatory Visit: Payer: Self-pay | Admitting: *Deleted

## 2015-12-21 MED ORDER — FLUTICASONE PROPIONATE 50 MCG/ACT NA SUSP: 2.0000 | Freq: Every day | NASAL | Status: DC

## 2015-12-21 MED ORDER — METFORMIN HCL 500 MG PO TABS: ORAL_TABLET | ORAL | Status: DC

## 2016-01-09 ENCOUNTER — Emergency Department (HOSPITAL_COMMUNITY)
Admission: EM | Admit: 2016-01-09 | Discharge: 2016-01-09 | Disposition: A | Discharge status: Home / Self Care | Payer: Medicaid Other | Attending: Emergency Medicine | Admitting: Emergency Medicine

## 2016-01-09 ENCOUNTER — Encounter (HOSPITAL_COMMUNITY): Payer: Self-pay

## 2016-01-09 DIAGNOSIS — E039 Hypothyroidism, unspecified: Secondary | ICD-10-CM | POA: Diagnosis not present

## 2016-01-09 DIAGNOSIS — E1122 Type 2 diabetes mellitus with diabetic chronic kidney disease: Secondary | ICD-10-CM | POA: Diagnosis not present

## 2016-01-09 DIAGNOSIS — M25512 Pain in left shoulder: Secondary | ICD-10-CM | POA: Diagnosis not present

## 2016-01-09 DIAGNOSIS — F17210 Nicotine dependence, cigarettes, uncomplicated: Secondary | ICD-10-CM | POA: Insufficient documentation

## 2016-01-09 DIAGNOSIS — Z7984 Long term (current) use of oral hypoglycemic drugs: Secondary | ICD-10-CM | POA: Diagnosis not present

## 2016-01-09 DIAGNOSIS — N182 Chronic kidney disease, stage 2 (mild): Secondary | ICD-10-CM | POA: Insufficient documentation

## 2016-01-09 DIAGNOSIS — J449 Chronic obstructive pulmonary disease, unspecified: Secondary | ICD-10-CM | POA: Insufficient documentation

## 2016-01-09 DIAGNOSIS — Z7982 Long term (current) use of aspirin: Secondary | ICD-10-CM | POA: Diagnosis not present

## 2016-01-09 DIAGNOSIS — M79601 Pain in right arm: Secondary | ICD-10-CM | POA: Diagnosis not present

## 2016-01-09 DIAGNOSIS — Z79899 Other long term (current) drug therapy: Secondary | ICD-10-CM | POA: Diagnosis not present

## 2016-01-09 DIAGNOSIS — F319 Bipolar disorder, unspecified: Secondary | ICD-10-CM | POA: Diagnosis not present

## 2016-01-09 DIAGNOSIS — I129 Hypertensive chronic kidney disease with stage 1 through stage 4 chronic kidney disease, or unspecified chronic kidney disease: Secondary | ICD-10-CM | POA: Diagnosis not present

## 2016-01-09 DIAGNOSIS — M79602 Pain in left arm: Secondary | ICD-10-CM | POA: Diagnosis present

## 2016-01-09 MED ORDER — PREDNISONE 20 MG PO TABS: 40.0000 mg | ORAL_TABLET | Freq: Every day | ORAL | Status: DC

## 2016-01-09 MED ORDER — PREDNISONE 20 MG PO TABS
60.0000 mg | ORAL_TABLET | ORAL | Status: DC
  Filled 2016-01-09: qty 3

## 2016-01-09 NOTE — ED Provider Notes (Addendum)
CSN: [id number]     Arrival date & time 01/09/16  8592 History   First MD Initiated Contact with Patient 01/09/16 [id number]     Chief Complaint  Patient presents with  . Arm Pain     (Consider location/radiation/quality/duration/timing/severity/associated sxs/prior Treatment) HPI Patient presents with concern of left arm pain. Patient also has right arm pain, though notes this is less substantial per She notes that she has had a long history of pain in both arms, and multiple other joints are Without clear precipitant she has developed worsening pain in the left arm, without chest pain, dyspnea, weakness, fatigue, nausea, vomiting. Onset has been gradual.  Patient typically takes Norco, Tylenol for other arthralgia, notes that this has not changed her pain substantially. She notes the pain is worse with shoulder flexion, or lifting anything. She denies loss of sensation in the arm.   Past Medical History  Diagnosis Date  . BIPOLAR DISORDER 08/31/2006    Qualifier: Diagnosis of  By: Dorathy Daft MD, Marjory Lies    . CHRONIC KIDNEY DISEASE STAGE II (MILD) 09/14/2009    Annotation: eGFR 64 Qualifier: Diagnosis of  By: Jess Barters MD, [name]    . COPD (chronic obstructive pulmonary disease) ([place]) 09/23/2010    Diagnosed at [facility] in 2008 (Dr. Annamaria Boots)   . DM (diabetes mellitus) type II controlled with renal manifestation ([place]) 08/31/2006    Qualifier: Diagnosis of  By: Dorathy Daft MD, Marjory Lies    . HYPERCHOLESTEROLEMIA 08/31/2006    Intolerance to Lipitor OK on Crestor but medicaid no longer covering    . HYPERTENSION, BENIGN SYSTEMIC 08/31/2006    Qualifier: Diagnosis of  By: Dorathy Daft MD, Marjory Lies    . HYPOTHYROIDISM, UNSPECIFIED 08/31/2006    Qualifier: Diagnosis of  By: Dorathy Daft MD, Marjory Lies     Past Surgical History  Procedure Laterality Date  . Cesarean section    . Foot surgery    . Knee surgery Bilateral    No family history on file. Social History  Substance Use Topics  . Smoking status: Current Every  Day Smoker -- 0.50 packs/day for 39 years    Types: Cigarettes  . Smokeless tobacco: None  . Alcohol Use: Yes   OB History    No data available     Review of Systems  Constitutional:       Per HPI, otherwise negative  HENT:       Per HPI, otherwise negative  Respiratory:       Per HPI, otherwise negative  Cardiovascular:       Per HPI, otherwise negative  Gastrointestinal: Negative for vomiting.  Endocrine:       Negative aside from HPI  Genitourinary:       Neg aside from HPI   Musculoskeletal:       Per HPI, otherwise negative  Skin: Negative.   Neurological: Negative for syncope.      Allergies  Ibuprofen; Lipitor; Lisinopril; and Codeine  Home Medications   Prior to Admission medications   Medication Sig Start Date End Date Taking? Authorizing Provider  albuterol (PROVENTIL HFA) 108 (90 Base) MCG/ACT inhaler Inhale 2 puffs into the lungs every 4 (four) hours as needed. For wheezing 10/19/15   [name], DO  amLODipine (NORVASC) 5 MG tablet Take 1 tablet (5 mg total) by mouth daily. 04/02/15   [name], DO  aspirin ([employer] ASPIRIN) 81 MG chewable tablet Chew 81 mg by mouth daily.      Historical Provider, MD  beclomethasone (QVAR)  80 MCG/ACT inhaler Inhale 1 puff into the lungs daily. 02/26/15   [name], DO  DEPAKOTE ER 250 MG 24 hr tablet take 1 tablet by mouth twice a day and 2 tablets at bedtime 05/04/15   Historical Provider, MD  fluticasone (FLONASE) 50 MCG/ACT nasal spray Place 2 sprays into both nostrils daily. 12/21/15   [name], DO  glucose blood (ACCU-CHEK AVIVA PLUS) test strip Use as instructed 04/21/15   [name], DO  hydrochlorothiazide (HYDRODIURIL) 25 MG tablet take 1 tablet by mouth once daily for blood pressure 10/01/15   [name], DO  Lancets (ACCU-CHEK MULTICLIX) lancets TEST BLOOD SUGAR 1 TO 3 TIMES PER WEEK 04/21/14   Coral Spikes, DO  levothyroxine (SYNTHROID) 50 MCG tablet Take 1  tablet (50 mcg total) by mouth daily before breakfast. 02/03/15   [name], DO  meloxicam (MOBIC) 15 MG tablet Take 1 tablet (15 mg total) by mouth daily. 09/18/15   [name], DO  metFORMIN (GLUCOPHAGE) 500 MG tablet Take 1 tablet by mouth once daily WITH BREAKFAST 12/21/15   [name], DO  metroNIDAZOLE (FLAGYL) 500 MG tablet Take 1 tablet (500 mg total) by mouth 2 (two) times daily. x7 days. 02/26/15   [name], DO  naproxen (NAPROSYN) 500 MG tablet Take one tablet a day for 10 days, then as needed for pain 10/21/15   Nolon Rod, DO  nitroGLYCERIN (NITROSTAT) 0.4 MG SL tablet Place 1 tablet (0.4 mg total) under the tongue every 5 (five) minutes as needed for chest pain. 02/26/15   [name], DO  omeprazole (PRILOSEC) 40 MG capsule Take 1 capsule (40 mg total) by mouth daily. 02/03/15   [name], DO  PATADAY 0.2 % SOLN Place 1 drop into both eyes daily. 09/29/15   [name], DO  potassium chloride (K-DUR) 10 MEQ tablet Take 2 tablets in am and 2 tablets in pm x1 day.  Then take 1 tablet daily. 05/07/15   [name], DO  pravastatin (PRAVACHOL) 40 MG tablet take 1 tablet by mouth every evening 04/06/15   [name], DO  predniSONE (DELTASONE) 20 MG tablet Take 2 tablets (40 mg total) by mouth daily with breakfast. For the next four days 01/09/16   [name], MD  risperiDONE (RISPERDAL) 2 MG tablet Take 0.5 tablets (1 mg total) by mouth at bedtime. 10/22/10   [name], MD   BP 140/98 mmHg  Pulse 73  Temp(Src) 98.2 F (36.8 C) (Oral)  Resp 20  Ht '5\' 3"'  (1.6 m)  Wt 150 lb (68.04 kg)  BMI 26.58 kg/m2  SpO2 98% Physical Exam  Constitutional: She is oriented to person, place, and time. She appears well-developed and well-nourished. No distress.  HENT:  Head: Normocephalic and atraumatic.  Eyes: Conjunctivae and EOM are normal.  Cardiovascular: Normal rate and regular rhythm.   Pulmonary/Chest: Effort normal and  breath sounds normal. No stridor. No respiratory distress.  Abdominal: She exhibits no distension.  Musculoskeletal: She exhibits no edema.       Arms: Neurological: She is alert and oriented to person, place, and time. No cranial nerve deficit.  Skin: Skin is warm and dry.  Psychiatric: She has a normal mood and affect.  Nursing note and vitals reviewed.   ED Course  Procedures (including critical care time)   MDM   Final diagnoses:  Shoulder pain, left   Patient presents with acute  on chronic arm pain. Here the patient is awake and alert, hemodynamically stable, and with no complaints of chest pain, no dyspnea, and with reproducible shoulder pain, both with pressure and rotation, there is low suspicion for atypical ACS. Patient has an orthopedist with whom she'll follow-up as week after initiation of steroid therapy, cryotherapy.  [name], MD 01/09/16 0748  8:18 AM Patient refused oral steroids. She also refused IV or IM medication. She requested an arm sling. This was accommodated, patient encouraged to use cryotherapy, follow up with orthopedics.  [name], MD 01/09/16 [phone]

## 2016-01-09 NOTE — ED Notes (Signed)
Patient c/o bilateral shoulder pain
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
## 2016-01-09 NOTE — Discharge Instructions (Signed)
As discussed, your shoulder pain will improve over the next few days using your new prescription, in addition to ice packs, used 4 times daily.  Please be sure to call your orthopedist on Monday to expedite your appointment as well.  Return here for concerning changes in your condition.   Cryotherapy Cryotherapy means treatment with cold. Ice or gel packs can be used to reduce both pain and swelling. Ice is the most helpful within the first 24 to 48 hours after an injury or flare-up from overusing a muscle or joint. Sprains, strains, spasms, burning pain, shooting pain, and aches can all be eased with ice. Ice can also be used when recovering from surgery. Ice is effective, has very few side effects, and is safe for most people to use. PRECAUTIONS  Ice is not a safe treatment option for people with:  Raynaud phenomenon. This is a condition affecting small blood vessels in the extremities. Exposure to cold may cause your problems to return.  Cold hypersensitivity. There are many forms of cold hypersensitivity, including:  Cold urticaria. Red, itchy hives appear on the skin when the tissues begin to warm after being iced.  Cold erythema. This is a red, itchy rash caused by exposure to cold.  Cold hemoglobinuria. Red blood cells break down when the tissues begin to warm after being iced. The hemoglobin that carry oxygen are passed into the urine because they cannot combine with blood proteins fast enough.  Numbness or altered sensitivity in the area being iced. If you have any of the following conditions, do not use ice until you have discussed cryotherapy with your caregiver:  Heart conditions, such as arrhythmia, angina, or chronic heart disease.  High blood pressure.  Healing wounds or open skin in the area being iced.  Current infections.  Rheumatoid arthritis.  Poor circulation.  Diabetes. Ice slows the blood flow in the region it is applied. This is beneficial when trying to  stop inflamed tissues from spreading irritating chemicals to surrounding tissues. However, if you expose your skin to cold temperatures for too long or without the proper protection, you can damage your skin or nerves. Watch for signs of skin damage due to cold. HOME CARE INSTRUCTIONS Follow these tips to use ice and cold packs safely.  Place a dry or damp towel between the ice and skin. A damp towel will cool the skin more quickly, so you may need to shorten the time that the ice is used.  For a more rapid response, add gentle compression to the ice.  Ice for no more than 10 to 20 minutes at a time. The bonier the area you are icing, the less time it will take to get the benefits of ice.  Check your skin after 5 minutes to make sure there are no signs of a poor response to cold or skin damage.  Rest 20 minutes or more between uses.  Once your skin is numb, you can end your treatment. You can test numbness by very lightly touching your skin. The touch should be so light that you do not see the skin dimple from the pressure of your fingertip. When using ice, most people will feel these normal sensations in this order: cold, burning, aching, and numbness.  Do not use ice on someone who cannot communicate their responses to pain, such as small children or people with dementia. HOW TO MAKE AN ICE PACK Ice packs are the most common way to use ice therapy. Other methods include  ice massage, ice baths, and cryosprays. Muscle creams that cause a cold, tingly feeling do not offer the same benefits that ice offers and should not be used as a substitute unless recommended by your caregiver. To make an ice pack, do one of the following:  Place crushed ice or a bag of frozen vegetables in a sealable plastic bag. Squeeze out the excess air. Place this bag inside another plastic bag. Slide the bag into a pillowcase or place a damp towel between your skin and the bag.  Mix 3 parts water with 1 part rubbing  alcohol. Freeze the mixture in a sealable plastic bag. When you remove the mixture from the freezer, it will be slushy. Squeeze out the excess air. Place this bag inside another plastic bag. Slide the bag into a pillowcase or place a damp towel between your skin and the bag. SEEK MEDICAL CARE IF:  You develop white spots on your skin. This may give the skin a blotchy (mottled) appearance.  Your skin turns blue or pale.  Your skin becomes waxy or hard.  Your swelling gets worse. MAKE SURE YOU:   Understand these instructions.  Will watch your condition.  Will get help right away if you are not doing well or get worse.   This information is not intended to replace advice given to you by your health care provider. Make sure you discuss any questions you have with your health care provider.   Document Released: 02/14/2011 Document Revised: 07/11/2014 Document Reviewed: 02/14/2011 Elsevier Interactive Patient Education [employer].

## 2016-01-11 ENCOUNTER — Other Ambulatory Visit: Payer: Self-pay | Admitting: Family Medicine

## 2016-01-12 NOTE — Telephone Encounter (Signed)
Is this ok to refill?  

## 2016-01-14 ENCOUNTER — Telehealth: Payer: Self-pay | Admitting: Family Medicine

## 2016-01-19 ENCOUNTER — Other Ambulatory Visit: Payer: Self-pay | Admitting: *Deleted

## 2016-01-19 DIAGNOSIS — E039 Hypothyroidism, unspecified: Secondary | ICD-10-CM

## 2016-01-20 ENCOUNTER — Encounter (HOSPITAL_COMMUNITY): Payer: Self-pay | Admitting: Emergency Medicine

## 2016-01-20 DIAGNOSIS — I129 Hypertensive chronic kidney disease with stage 1 through stage 4 chronic kidney disease, or unspecified chronic kidney disease: Secondary | ICD-10-CM | POA: Diagnosis not present

## 2016-01-20 DIAGNOSIS — R200 Anesthesia of skin: Secondary | ICD-10-CM | POA: Diagnosis not present

## 2016-01-20 DIAGNOSIS — F17210 Nicotine dependence, cigarettes, uncomplicated: Secondary | ICD-10-CM | POA: Diagnosis not present

## 2016-01-20 DIAGNOSIS — E039 Hypothyroidism, unspecified: Secondary | ICD-10-CM | POA: Diagnosis not present

## 2016-01-20 DIAGNOSIS — Z79899 Other long term (current) drug therapy: Secondary | ICD-10-CM | POA: Insufficient documentation

## 2016-01-20 DIAGNOSIS — Z7984 Long term (current) use of oral hypoglycemic drugs: Secondary | ICD-10-CM | POA: Insufficient documentation

## 2016-01-20 DIAGNOSIS — J449 Chronic obstructive pulmonary disease, unspecified: Secondary | ICD-10-CM | POA: Insufficient documentation

## 2016-01-20 DIAGNOSIS — N182 Chronic kidney disease, stage 2 (mild): Secondary | ICD-10-CM | POA: Insufficient documentation

## 2016-01-20 DIAGNOSIS — Z7982 Long term (current) use of aspirin: Secondary | ICD-10-CM | POA: Diagnosis not present

## 2016-01-20 DIAGNOSIS — E1122 Type 2 diabetes mellitus with diabetic chronic kidney disease: Secondary | ICD-10-CM | POA: Insufficient documentation

## 2016-01-20 DIAGNOSIS — Z794 Long term (current) use of insulin: Secondary | ICD-10-CM | POA: Diagnosis not present

## 2016-01-20 MED ORDER — ACCU-CHEK MULTICLIX LANCETS MISC: Status: DC

## 2016-01-20 MED ORDER — LEVOTHYROXINE SODIUM 50 MCG PO TABS: 50.0000 ug | ORAL_TABLET | Freq: Every day | ORAL | Status: DC
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
## 2016-01-21 ENCOUNTER — Emergency Department (HOSPITAL_COMMUNITY)
Admission: EM | Admit: 2016-01-21 | Discharge: 2016-01-21 | Disposition: A | Discharge status: Home / Self Care | Payer: Medicaid Other | Attending: Emergency Medicine | Admitting: Emergency Medicine

## 2016-01-21 ENCOUNTER — Encounter (HOSPITAL_COMMUNITY): Payer: Self-pay | Admitting: Radiology

## 2016-01-21 ENCOUNTER — Emergency Department (HOSPITAL_COMMUNITY): Payer: Medicaid Other

## 2016-01-21 DIAGNOSIS — R200 Anesthesia of skin: Secondary | ICD-10-CM

## 2016-01-21 LAB — CBC WITH DIFFERENTIAL/PLATELET
Basophils Absolute: 0 10*3/uL (ref 0.0–0.1)
Basophils Relative: 0 %
Eosinophils Absolute: 0.1 10*3/uL (ref 0.0–0.7)
Eosinophils Relative: 2 %
HCT: 36.9 % (ref 36.0–46.0)
Hemoglobin: 12.9 g/dL (ref 12.0–15.0)
Lymphocytes Relative: 47 %
Lymphs Abs: 2.7 10*3/uL (ref 0.7–4.0)
MCH: 34.9 pg — ABNORMAL HIGH (ref 26.0–34.0)
MCHC: 35 g/dL (ref 30.0–36.0)
MCV: 99.7 fL (ref 78.0–100.0)
Monocytes Absolute: 0.6 10*3/uL (ref 0.1–1.0)
Monocytes Relative: 10 %
Neutro Abs: 2.4 10*3/uL (ref 1.7–7.7)
Neutrophils Relative %: 41 %
Platelets: 214 10*3/uL (ref 150–400)
RBC: 3.7 MIL/uL — ABNORMAL LOW (ref 3.87–5.11)
RDW: 13.3 % (ref 11.5–15.5)
WBC: 5.8 10*3/uL (ref 4.0–10.5)

## 2016-01-21 LAB — BASIC METABOLIC PANEL
Anion gap: 9 (ref 5–15)
BUN: 8 mg/dL (ref 6–20)
CO2: 26 mmol/L (ref 22–32)
Calcium: 8.5 mg/dL — ABNORMAL LOW (ref 8.9–10.3)
Chloride: 93 mmol/L — ABNORMAL LOW (ref 101–111)
Creatinine, Ser: 0.85 mg/dL (ref 0.44–1.00)
GFR calc Af Amer: >60 mL/min (ref >60)
GFR calc non Af Amer: >60 mL/min (ref >60)
Glucose, Bld: 117 mg/dL — ABNORMAL HIGH (ref 65–99)
Potassium: 2.6 mmol/L — CL (ref 3.5–5.1)
Sodium: 128 mmol/L — ABNORMAL LOW (ref 135–145)

## 2016-01-21 MED ORDER — POTASSIUM CHLORIDE CRYS ER 20 MEQ PO TBCR
60.0000 meq | EXTENDED_RELEASE_TABLET | Freq: Once | ORAL | Status: AC
  Administered 2016-01-21: 60 meq via ORAL
  Filled 2016-01-21: qty 3

## 2016-01-21 NOTE — Discharge Instructions (Signed)
Paresthesia Paresthesia is an abnormal burning or prickling sensation. This sensation is generally felt in the hands, arms, legs, or feet. However, it may occur in any part of the body. Usually, it is not painful. The feeling may be described as:  Tingling or numbness.  Pins and needles.  Skin crawling.  Buzzing.  Limbs falling asleep.  Itching. Most people experience temporary (transient) paresthesia at some time in their lives. Paresthesia may occur when you breathe too quickly (hyperventilation). It can also occur without any apparent cause. Commonly, paresthesia occurs when pressure is placed on a nerve. The sensation quickly goes away after the pressure is removed. For some people, however, paresthesia is a long-lasting (chronic) condition that is caused by an underlying disorder. If you continue to have paresthesia, you may need further medical evaluation. HOME CARE INSTRUCTIONS Watch your condition for any changes. Taking the following actions may help to lessen any discomfort that you are feeling:  Avoid drinking alcohol.  Try acupuncture or massage to help relieve your symptoms.  Keep all follow-up visits as directed by your health care provider. This is important. SEEK MEDICAL CARE IF:  You continue to have episodes of paresthesia.  Your burning or prickling feeling gets worse when you walk.  You have pain, cramps, or dizziness.  You develop a rash. SEEK IMMEDIATE MEDICAL CARE IF:  You feel weak.  You have trouble walking or moving.  You have problems with speech, understanding, or vision.  You feel confused.  You cannot control your bladder or bowel movements.  You have numbness after an injury.  You faint.   This information is not intended to replace advice given to you by your health care provider. Make sure you discuss any questions you have with your health care provider.   Document Released: 06/10/2002 Document Revised: 11/04/2014 Document Reviewed:  06/16/2014 Elsevier Interactive Patient Education 2016 Elsevier Inc.  

## 2016-01-21 NOTE — ED Provider Notes (Addendum)
CSN: [id number]     Arrival date & time 01/20/16  2258 History  By signing my name below, I, [name] Long, attest that this documentation has been prepared under the direction and in the presence of [name], MD. Electronically Signed: Soijett Long, ED Scribe. 01/21/2016. 2:49 AM.   Chief Complaint  Patient presents with  . Neck Pain      The history is provided by the patient. No language interpreter was used.   HPI Comments: [name] Long is a 65 y.o. female who presents to the Emergency Department complaining of numbness onset 6 days. Pt notes that she has numbness to her nasal area that radiates to her entire left arm. She states that her numbness is intermittent and she notices the numbness when she is calm. Pt reports that she is here to be evaluated and ensure that she didn't have a stroke. Pt denies having neck pain at this time, as she is concerned primarily for her numbness that she is experiencing. She states that she is having associated symptoms of HA, dizziness, and mild vision change. She states that she has not tried any medications for the relief for her symptoms. She denies neck pain, weakness, fever, chills, gait problem, and any other symptoms.   Past Medical History  Diagnosis Date  . BIPOLAR DISORDER 08/31/2006    Qualifier: Diagnosis of  By: Dorathy Daft MD, Marjory Lies    . CHRONIC KIDNEY DISEASE STAGE II (MILD) 09/14/2009    Annotation: eGFR 64 Qualifier: Diagnosis of  By: Jess Barters MD, [name]    . COPD (chronic obstructive pulmonary disease) ([place]) 09/23/2010    Diagnosed at [facility] in 2008 (Dr. Annamaria Boots)   . DM (diabetes mellitus) type II controlled with renal manifestation ([name]) 08/31/2006    Qualifier: Diagnosis of  By: Dorathy Daft MD, Marjory Lies    . HYPERCHOLESTEROLEMIA 08/31/2006    Intolerance to Lipitor OK on Crestor but medicaid no longer covering    . HYPERTENSION, BENIGN SYSTEMIC 08/31/2006    Qualifier: Diagnosis of  By: Dorathy Daft MD, Marjory Lies    . HYPOTHYROIDISM,  UNSPECIFIED 08/31/2006    Qualifier: Diagnosis of  By: Dorathy Daft MD, Marjory Lies     Past Surgical History  Procedure Laterality Date  . Cesarean section    . Foot surgery    . Knee surgery Bilateral    No family history on file. Social History  Substance Use Topics  . Smoking status: Current Every Day Smoker -- 0.00 packs/day for 39 years    Types: Cigarettes  . Smokeless tobacco: None  . Alcohol Use: Yes   OB History    No data available     Review of Systems  A complete 10 system review of systems was obtained and all systems are negative except as noted in the HPI and PMH.   Allergies  Ibuprofen; Lipitor; Lisinopril; and Codeine  Home Medications   Prior to Admission medications   Medication Sig Start Date End Date Taking? Authorizing Provider  albuterol (PROVENTIL HFA) 108 (90 Base) MCG/ACT inhaler Inhale 2 puffs into the lungs every 4 (four) hours as needed. For wheezing 10/19/15   [name], DO  amLODipine (NORVASC) 5 MG tablet Take 1 tablet (5 mg total) by mouth daily. 04/02/15   [name], DO  aspirin (BAYER CHILDRENS ASPIRIN) 81 MG chewable tablet Chew 81 mg by mouth daily.      Historical Provider, MD  beclomethasone (QVAR) 80 MCG/ACT inhaler Inhale 1 puff into the lungs daily. 02/26/15  [name], DO  DEPAKOTE ER 250 MG 24 hr tablet take 1 tablet by mouth twice a day and 2 tablets at bedtime 05/04/15   Historical Provider, MD  fluticasone (FLONASE) 50 MCG/ACT nasal spray Place 2 sprays into both nostrils daily. 12/21/15   [name], DO  glucose blood (ACCU-CHEK AVIVA PLUS) test strip Use as instructed 04/21/15   [name], DO  hydrochlorothiazide (HYDRODIURIL) 25 MG tablet take 1 tablet by mouth once daily for blood pressure 10/01/15   [name], DO  Lancets (ACCU-CHEK MULTICLIX) lancets TEST BLOOD SUGAR 1 TO 3 TIMES PER WEEK 01/20/16   [name], DO  levothyroxine (SYNTHROID) 50 MCG tablet Take 1 tablet (50 mcg  total) by mouth daily before breakfast. 01/20/16   [name], DO  meloxicam (MOBIC) 15 MG tablet Take 1 tablet (15 mg total) by mouth daily. 09/18/15   [name], DO  metFORMIN (GLUCOPHAGE) 500 MG tablet Take 1 tablet by mouth once daily WITH BREAKFAST 12/21/15   [name], DO  metroNIDAZOLE (FLAGYL) 500 MG tablet Take 1 tablet (500 mg total) by mouth 2 (two) times daily. x7 days. 02/26/15   [name], DO  naproxen (NAPROSYN) 500 MG tablet take 1 tablet by mouth once daily for 10 days then if needed for pain 01/12/16   [name], DO  nitroGLYCERIN (NITROSTAT) 0.4 MG SL tablet Place 1 tablet (0.4 mg total) under the tongue every 5 (five) minutes as needed for chest pain. 02/26/15   [name], DO  omeprazole (PRILOSEC) 40 MG capsule Take 1 capsule (40 mg total) by mouth daily. 02/03/15   [name], DO  PATADAY 0.2 % SOLN Place 1 drop into both eyes daily. 09/29/15   [name], DO  potassium chloride (K-DUR) 10 MEQ tablet Take 2 tablets in am and 2 tablets in pm x1 day.  Then take 1 tablet daily. 05/07/15   [name], DO  pravastatin (PRAVACHOL) 40 MG tablet take 1 tablet by mouth every evening 04/06/15   [name], DO  predniSONE (DELTASONE) 20 MG tablet Take 2 tablets (40 mg total) by mouth daily with breakfast. For the next four days 01/09/16   [name], MD  risperiDONE (RISPERDAL) 2 MG tablet Take 0.5 tablets (1 mg total) by mouth at bedtime. 10/22/10   [name], MD   BP 131/67 mmHg  Pulse 72  Temp(Src) 98.2 F (36.8 C) (Oral)  Resp 20  Ht '5\' 3"'  (1.6 m)  Wt 144 lb 6 oz (65.488 kg)  BMI 25.58 kg/m2  SpO2 100% Physical Exam  Constitutional: She is oriented to person, place, and time. She appears well-developed and well-nourished. No distress.  HENT:  Head: Normocephalic and atraumatic.  Eyes: EOM are normal.  Neck: Neck supple.  Cardiovascular: Normal rate, regular rhythm and normal heart sounds.  Exam  reveals no gallop and no friction rub.   No murmur heard. Pulmonary/Chest: Effort normal and breath sounds normal. No respiratory distress. She has no wheezes. She has no rales.  Abdominal: Soft. She exhibits no distension. There is no tenderness.  Musculoskeletal: Normal range of motion.  Neurological: She is alert and oriented to person, place, and time. She has normal strength. A sensory deficit is present. No cranial nerve deficit. Coordination normal.  Decreased sensation to light touch, left face, left neck, left shoulder, and all dermatomes.   Skin: Skin is warm and dry.  Psychiatric:  She has a normal mood and affect. Her behavior is normal.  Nursing note and vitals reviewed.   ED Course  Procedures (including critical care time) DIAGNOSTIC STUDIES: Oxygen Saturation is 100% on RA, nl by my interpretation.    COORDINATION OF CARE: 2:44 AM Discussed treatment plan with pt at bedside which includes CT head, labs, EKG, and pt agreed to plan.    Labs Review Labs Reviewed - No data to display  Imaging Review No results found. I have personally reviewed and evaluated these images and lab results as part of my medical decision-making.   EKG Interpretation None      MDM   Final diagnoses:  LUE numbness   65yF with LUE numbness. Doubt CVA. Neuro exam nonfocal. Cervical issue?  [name], MD 02/04/16 2230  I personally preformed the services scribed in my presence. The recorded information has been reviewed is accurate. [name], MD.    [name], MD 02/04/16 2230

## 2016-01-27 ENCOUNTER — Telehealth: Payer: Self-pay | Admitting: *Deleted

## 2016-01-27 NOTE — Telephone Encounter (Signed)
Pt calling in wanting a referral for ortho ([facility] [address]). For paralysis on her left side. She was seen in the ED on 01/21/16. Please advise. Deseree Kennon Holter, CMA

## 2016-01-28 NOTE — Telephone Encounter (Signed)
Patien calling again regarding referral to ortho, states she has already been to the ED twice for this issue.

## 2016-01-28 NOTE — Telephone Encounter (Signed)
I have looked over the ED notes from last week. Unfortunately this note is not complete so I am unsure of what treatment or medical management patient received during this visit. I have attached the associated attending ED physician to this note.  In the meantime no referral will be made as we will need to either assess this patient on our own or additional input from her ED visit.  If patient is having any true paralysis and/or urinary/fecal incontinence and I strongly recommend that she report to her nearest ED to be evaluated.

## 2016-01-29 NOTE — Telephone Encounter (Signed)
Called patient to discuss her grievances. Patient is requesting numerous previous letters/records from her recent past. I informed her that all these records could be printed off by the staff at our clinic because they are a part of her medical record and thus a part of her property. Patient is also asking for referral to orthopedic surgery. I informed her that without a physical examination on my part or formal recommendations from a healthcare professional I did not feel comfortable signing any referrals at this time. Patient has an appointment with her PCP on 02/02/16. I informed her that at that time she can discuss this matter with her PCP.  Patient had no further questions.

## 2016-01-29 NOTE — Telephone Encounter (Signed)
Patient calls again inquiring about referral, informed her of below message from MD. Patient upset and requesting to speak with PCP.

## 2016-02-02 ENCOUNTER — Encounter: Payer: Self-pay | Admitting: Family Medicine

## 2016-02-02 ENCOUNTER — Ambulatory Visit (INDEPENDENT_AMBULATORY_CARE_PROVIDER_SITE_OTHER): Payer: Medicaid Other | Admitting: Family Medicine

## 2016-02-02 VITALS — Temp 97.6°F | Ht 63.0 in | Wt 144.6 lb

## 2016-02-02 DIAGNOSIS — E876 Hypokalemia: Secondary | ICD-10-CM

## 2016-02-02 DIAGNOSIS — Z1159 Encounter for screening for other viral diseases: Secondary | ICD-10-CM

## 2016-02-02 DIAGNOSIS — E039 Hypothyroidism, unspecified: Secondary | ICD-10-CM

## 2016-02-02 DIAGNOSIS — I10 Essential (primary) hypertension: Secondary | ICD-10-CM

## 2016-02-02 DIAGNOSIS — Z0000 Encounter for general adult medical examination without abnormal findings: Secondary | ICD-10-CM

## 2016-02-02 DIAGNOSIS — E119 Type 2 diabetes mellitus without complications: Secondary | ICD-10-CM

## 2016-02-02 DIAGNOSIS — Z1211 Encounter for screening for malignant neoplasm of colon: Secondary | ICD-10-CM

## 2016-02-02 LAB — TSH: TSH: 0.99 mIU/L
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
## 2016-02-02 NOTE — Patient Instructions (Signed)
I will contact you will the results of your labs.  If anything is abnormal, I will call you.  Otherwise, expect a copy to be mailed to you.  Follow up with me in 3 months.  I will send [name], our social worker, your file to see if we can get you help paying your copays.  Stop smoking.

## 2016-02-03 ENCOUNTER — Encounter: Payer: Self-pay | Admitting: Family Medicine

## 2016-02-03 ENCOUNTER — Other Ambulatory Visit: Payer: Self-pay | Admitting: Family Medicine

## 2016-02-03 LAB — BASIC METABOLIC PANEL WITH GFR
BUN: 10 mg/dL (ref 7–25)
CO2: 30 mmol/L (ref 20–31)
Calcium: 9.8 mg/dL (ref 8.6–10.4)
Chloride: 95 mmol/L — ABNORMAL LOW (ref 98–110)
Creat: 0.76 mg/dL (ref 0.50–0.99)
GFR, Est African American: >89 mL/min (ref >=60)
GFR, Est Non African American: 83 mL/min (ref >=60)
Glucose, Bld: 82 mg/dL (ref 65–99)
Potassium: 3.4 mmol/L — ABNORMAL LOW (ref 3.5–5.3)
Sodium: 136 mmol/L (ref 135–146)

## 2016-02-03 LAB — HEPATITIS C ANTIBODY: HCV Ab: NEGATIVE

## 2016-02-03 LAB — LIPID PANEL
Cholesterol: 156 mg/dL (ref 125–200)
HDL: 49 mg/dL (ref >=46)
LDL Cholesterol: 81 mg/dL (ref <130)
Total CHOL/HDL Ratio: 3.2 Ratio (ref <=5.0)
Triglycerides: 128 mg/dL (ref <150)
VLDL: 26 mg/dL (ref <30)

## 2016-02-03 LAB — MAGNESIUM: Magnesium: 1.8 mg/dL (ref 1.5–2.5)

## 2016-02-08 ENCOUNTER — Telehealth: Payer: Self-pay | Admitting: *Deleted
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
## 2016-02-09 NOTE — Telephone Encounter (Signed)
This was handwritten onto her note that I placed up front.  Please check to see that it is there.

## 2016-02-09 NOTE — Telephone Encounter (Signed)
Pt called and needs a copy of her latest lab results , and the letter written om 02/02/16. Also the letter written on 05/02/14 needs to be given again with this year on this. I have printed out her lab results and the letter from 02/02/16. We just need the date changed on 05/02/14 to today's date. Please call patient when she can pick up of these papers. jw

## 2016-02-10 ENCOUNTER — Other Ambulatory Visit: Payer: Self-pay | Admitting: Family Medicine

## 2016-02-10 DIAGNOSIS — M159 Polyosteoarthritis, unspecified: Secondary | ICD-10-CM

## 2016-02-10 NOTE — Telephone Encounter (Signed)
Contacted pt about these letters to see if she had been by to pick them up since they were not up front. I asked Janett Billow and she said that Kennyth Lose had been working with pt about these letters and she had them. I did see the letter that you had hand written on and Kennyth Lose stated that this letter would not be suffice for what it is needed for.  Wanted to see if it would be suffice to copy the letter form 2015 and update it and print it for the pt. Also pt stated she needed a referral to [facility] for now until January.  Forwarding to PCP to be advised. Katharina Caper, April D, [place]

## 2016-02-10 NOTE — Telephone Encounter (Signed)
Letter done and routed to [facility] and ortho referral placed.  Thanks!

## 2016-02-11 ENCOUNTER — Telehealth: Payer: Self-pay | Admitting: Family Medicine

## 2016-02-11 NOTE — Telephone Encounter (Signed)
Patient wants to remind PCP she is still waiting for information about financial assistance. Also, she asks for an orthopedic referral from August 2017 until January 2018. Please, follow up.

## 2016-02-12 NOTE — Telephone Encounter (Signed)
Referral placed already.  I will cc patient's file to Casimer Lanius, CSW.

## 2016-02-12 NOTE — Telephone Encounter (Signed)
[name] Long confirmed that pt came on 02/11/16 to pick up these requested items. Katharina Caper, April D, [place]

## 2016-02-16 ENCOUNTER — Telehealth: Payer: Self-pay | Admitting: Licensed Clinical Social Worker

## 2016-02-16 NOTE — Telephone Encounter (Signed)
-----   Message from [name], DO sent at 02/12/2016 10:51 AM EDT ----- Patient reports that she is having financial difficulties.  Do we have resources for her?

## 2016-02-16 NOTE — Progress Notes (Signed)
Social work consult from Dr. Lajuana Ripple to assist patient with resources due to financial difficulties.    Call to patient to assess and obtain more information about her needs.  Phone rang several times, unable to leave a message.  Will try calling back later.    Casimer Lanius, LCSW Licensed Clinical Social Worker [facility]   [phone] 9:08 AM

## 2016-02-18 ENCOUNTER — Other Ambulatory Visit: Payer: Self-pay | Admitting: Family Medicine

## 2016-02-18 ENCOUNTER — Telehealth: Payer: Self-pay | Admitting: Licensed Clinical Social Worker

## 2016-02-18 DIAGNOSIS — I10 Essential (primary) hypertension: Secondary | ICD-10-CM

## 2016-02-18 NOTE — Progress Notes (Signed)
3rd call to patient to attempt to assess for community resources.  Phone continues to ring with no option to leave a message.   Plan: If patient calls please obtain a phone number for CSW to contact her.  Casimer Lanius, LCSW Licensed Clinical Social Worker [place]   [phone] 11:05 AM

## 2016-02-19 NOTE — Telephone Encounter (Signed)
Called pt no option to leave message, phone just rang. If pt calls, please inform her of below and get a phone number Neoma Laming can reach pt as Neoma Laming has tried to call the pt 3 times. Ottis Stain, CMA

## 2016-02-25 ENCOUNTER — Telehealth: Payer: Self-pay | Admitting: Licensed Clinical Social Worker

## 2016-02-25 NOTE — Progress Notes (Signed)
4th and last attempt to contact patient in reference to social work consult received from Dr. Lajuana Ripple for assistance with co-pay.  Phone continues to ring, unable to leave a message.    Plan: If patient calls please transfer her to social work or obtain a phone number.   Casimer Lanius, LCSW Licensed Clinical Social Worker [place]   [phone] 11:17 AM

## 2016-03-05 ENCOUNTER — Other Ambulatory Visit: Payer: Self-pay | Admitting: Sports Medicine

## 2016-03-08 ENCOUNTER — Other Ambulatory Visit: Payer: Self-pay | Admitting: Family Medicine

## 2016-03-08 DIAGNOSIS — J449 Chronic obstructive pulmonary disease, unspecified: Secondary | ICD-10-CM

## 2016-03-08 MED ORDER — ALBUTEROL SULFATE HFA 108 (90 BASE) MCG/ACT IN AERS: 2.0000 | INHALATION_SPRAY | RESPIRATORY_TRACT | 1 refills | Status: DC | PRN

## 2016-03-08 MED ORDER — BECLOMETHASONE DIPROPIONATE 80 MCG/ACT IN AERS
1.0000 | INHALATION_SPRAY | Freq: Two times a day (BID) | RESPIRATORY_TRACT | 12 refills | Status: DC
Start: 2016-03-08 — End: 2016-05-08

## 2016-03-08 NOTE — Telephone Encounter (Signed)
Order has been sent.  Also, please make sure that patient is using her Qvar TWICE daily.  I have sent in a renewal for this as well.  If patient is needing her albuterol inhaler more than 3-4 times weekly or needing it at night time, please have her follow up with me.  Albuterol is not intended for daily use.

## 2016-03-09 ENCOUNTER — Encounter: Payer: Self-pay | Admitting: Family Medicine

## 2016-03-09 ENCOUNTER — Ambulatory Visit (INDEPENDENT_AMBULATORY_CARE_PROVIDER_SITE_OTHER): Payer: Medicaid Other | Admitting: Family Medicine

## 2016-03-09 DIAGNOSIS — J069 Acute upper respiratory infection, unspecified: Secondary | ICD-10-CM | POA: Diagnosis present

## 2016-03-09 NOTE — Patient Instructions (Signed)
Upper respiratory infection, viral Treatment - you should: -  Take over-the-counter Tylenol as directed on the bottles for fever, pain, and/or inflammation. - Continue taking your inhaler medications as prescribed (Qvar and albuterol). -  You can pickup some pseudoephedrine (Sudafed) from your local pharmacy. This may help with most of your symptoms while you are at work. However, I would recommend not taking this before bedtime as it has a tendency to keep people awake.  Also, only take this when surely necessary as it can cause some "rebound effects" with worsening congestion and runny nose. -   Over-the-counter nasal saline spray may also help with much of the nasal irritation/congestion you may have.  You should be better in: 5-7 days Call us if you have severe shortness of breath, high fever or are not better in 2 weeks

## 2016-03-09 NOTE — Assessment & Plan Note (Signed)
Patient is here with complaints of symptoms consistent with a viral URI. Bilateral LAD present and clear rhinorrhea noted on exam. Patient has a history significant for COPD, however lung exam was benign. Patient has been afebrile.  - Treating conservatively: Tylenol, oral fluids, rest, nasal saline as needed. - Patient was displeased with my choice not to treat with any antibiotic. I would not be surprised if she contacts our office for another appointment later this week. - Patient encouraged that symptoms will likely resolve in 5-7 days but may take up to 2 weeks. No significant symptoms of concern at this time. If symptoms persist beyond 10-14 days would consider antibiotic therapy at that time.

## 2016-03-09 NOTE — Progress Notes (Signed)
COUGH Started about 3 weeks ago. Cough. Inhalers help. Dry cough. Clear rhinorhea. No fever. Ear pain, bilateral, L>R. No ear drainage.   Cough is: mostly dry Sputum production: minimal Medications tried: albuterol, flonase Taking blood pressure medications: CCB  Symptoms Runny nose: yes Mucous in back of throat: no Throat burning or reflux: no Wheezing or asthma: history, no wheeze Fever: no Chest Pain: no Shortness of breath: occasional Leg swelling: no Hemoptysis: no Weight loss: no  ROS see HPI Smoking Status >> seems to have a ~50 pack/year history.  - still smoking  Objective: BP (!) 138/93   Pulse 79   Temp 98.3 F (36.8 C) (Oral)   Ht 5\' 3"  (1.6 m)   Wt 140 lb 6.4 oz (63.7 kg)   BMI 24.87 kg/m  Gen: NAD, alert HEENT: NCAT, EOMI, PERRL, clear rhinorrhea present, TMs clear bilaterally with some mild erythema at the distal auditory canal bilaterally, bilateral sublingual LAD, OP mildly erythematous without evidence of exudate. CV: RRR, no murmur Resp: CTAB, no wheezes, non-labored Ext: No edema, warm  Assessment and plan:  URI (upper respiratory infection) Patient is here with complaints of symptoms consistent with a viral URI. Bilateral LAD present and clear rhinorrhea noted on exam. Patient has a history significant for COPD, however lung exam was benign. Patient has been afebrile.  - Treating conservatively: Tylenol, oral fluids, rest, nasal saline as needed. - Patient was displeased with my choice not to treat with any antibiotic. I would not be surprised if she contacts our office for another appointment later this week. - Patient encouraged that symptoms will likely resolve in 5-7 days but may take up to 2 weeks. No significant symptoms of concern at this time. If symptoms persist beyond 10-14 days would consider antibiotic therapy at that time.   Elberta Leatherwood, MD,MS,  PGY3 03/09/2016 11:02 AM

## 2016-03-14 NOTE — Telephone Encounter (Signed)
LVM for pt to call back to inform her of below and schedule her an appointment if she needs to. [name] Long, April D, [place]

## 2016-03-21 ENCOUNTER — Ambulatory Visit: Payer: Medicaid Other

## 2016-03-22 NOTE — Telephone Encounter (Signed)
Pt did not answer phone again.  Has RN visit on 03/29/16, will forward to RN Team to ask questions. [place], [name] Long, CMA

## 2016-03-23 NOTE — Telephone Encounter (Signed)
Letter written, printed, mailed. Ottis Stain, CMA

## 2016-03-23 NOTE — Telephone Encounter (Signed)
Tried to call pt. Phone just rang. Going to send a letter to pt as [name] Long, CSW has been trying to reach pt as well. Ottis Stain, CMA

## 2016-03-26 ENCOUNTER — Other Ambulatory Visit: Payer: Self-pay | Admitting: Sports Medicine

## 2016-03-29 ENCOUNTER — Other Ambulatory Visit: Payer: Self-pay | Admitting: Family Medicine

## 2016-03-29 ENCOUNTER — Ambulatory Visit (INDEPENDENT_AMBULATORY_CARE_PROVIDER_SITE_OTHER): Payer: Medicaid Other | Admitting: *Deleted

## 2016-03-29 DIAGNOSIS — F319 Bipolar disorder, unspecified: Secondary | ICD-10-CM

## 2016-03-29 DIAGNOSIS — Z7189 Other specified counseling: Secondary | ICD-10-CM

## 2016-03-29 MED ORDER — NAPROXEN 500 MG PO TABS: ORAL_TABLET | ORAL | 3 refills | Status: DC

## 2016-03-29 NOTE — Progress Notes (Signed)
   Pt in nurse clinic for injection.  Nurse asked patient what type of injection, patient stated stimulus injection.  Nurse asked patient if she needed a flu shot, patient stated no.  She also thought she was going to see provider today to have her physical completed.  Advised patient that her physical was done 02/02/16.  Patient decided that she would leave without having anything done.  Derl Barrow, RN

## 2016-03-29 NOTE — Telephone Encounter (Signed)
Pt informed of the message below. Katharina Caper, April D, [place]

## 2016-03-29 NOTE — Telephone Encounter (Signed)
Ativan, Risperdal, and Depakote are provided to patient by [facility].  Please have her contact her provider there for these medications.  Naproxen refilled.

## 2016-03-29 NOTE — Telephone Encounter (Signed)
Patient came to office for nurse visit (shots) request refill on meds Depakote ER she stated it must be name brand and needs 3 month supply.  Also request refill on Naproxen. Please let patient know when done.

## 2016-04-07 ENCOUNTER — Telehealth: Payer: Self-pay | Admitting: *Deleted

## 2016-04-07 NOTE — Telephone Encounter (Signed)
Patient in nurse clinic for cold symptoms.  Patient stated she was seen last month for the same symptoms, but they are not getting any better.  She is having a cough, yellow mucous and "sinus headache".  Patient is asking for antibiotic treatment.  Pt stated that the provider that saw her last told her if her symptoms are not better he would send medication in for her.  Patient reported taking Flonase with no relief and increase fluids. Will forward to PCP. Derl Barrow, RN

## 2016-04-07 NOTE — Telephone Encounter (Signed)
Recommend patient be evaluated if requiring abx.  Please relay to pt.

## 2016-04-08 NOTE — Telephone Encounter (Signed)
Informed patient that she should be seen in clinic for her symptoms.  The provider will not be sending any antibiotics without her seeing a provider.  Derl Barrow, RN

## 2016-04-14 ENCOUNTER — Telehealth: Payer: Self-pay | Admitting: Family Medicine

## 2016-04-14 NOTE — Telephone Encounter (Signed)
**  After Hours/ Emergency Line Call*  [name] Long calls to report persistent cough, congestion.  Denying fevers, chills.  Recommended that she be evaluated in office, as cough seems to have persisted since visit in September.  Red flags discussed.  Patient to see Dr Cyndia Skeeters in [place] tomorrow at 330pm.  [name]. Lajuana Ripple, DO PGY-3, [facility]

## 2016-04-15 ENCOUNTER — Ambulatory Visit: Payer: Medicaid Other | Admitting: Student

## 2016-04-27 ENCOUNTER — Telehealth: Payer: Self-pay | Admitting: Family Medicine

## 2016-04-27 ENCOUNTER — Other Ambulatory Visit: Payer: Self-pay | Admitting: Family Medicine

## 2016-04-27 DIAGNOSIS — S4991XA Unspecified injury of right shoulder and upper arm, initial encounter: Secondary | ICD-10-CM

## 2016-04-27 NOTE — Telephone Encounter (Signed)
Pt needs an urgent referral for [employer]. Pt believes she broke her arm. Pt has an appointment at 2pm with either Dr. Alfonso Ramus or Dr. French Ana, pt mentioned both. Pt said to call Julien Nordmann [phone] with referral. Pt is upset and stated if she wasn't able to be seen over there, it was going to be my fault and pt would hold me responsible. Please advise. Thanks! ep

## 2016-04-27 NOTE — Telephone Encounter (Signed)
Update on previous note.Marland KitchenMarland KitchenAfter Ms Mcfarlain told me I needed to follow the rules and regulations,  I told the pt " I am trying to help you" That is when she asked for my first and last name. Janett Billow has been informed of this conversation. Ottis Stain, CMA

## 2016-04-27 NOTE — Telephone Encounter (Signed)
Called Dr. Lajuana Ripple, referral put in. Called pt back to let her know that the referral has been placed and per Tia the pt does not have appointment for today but she can now call and schedule an appt. Pt was furious and told me that it was my job to call over there and make an appt and to call in the referral. I explained that [facility] has called and there isn't an appt scheduled. Pt then asked me that I needed to follow the rules and regulation and do my "damn job". She then asked for my name and asked if I was a LPN or a Marine scientist. 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 I responded with " ma'am, you don't need to cus at me like that and the pt hung up on me."

## 2016-04-27 NOTE — Telephone Encounter (Signed)
Called [name] Long and approved 6 visits for right arm injury. Appt is not scheduled for today and patient will need to call to their office to schedule.

## 2016-04-29 NOTE — Telephone Encounter (Signed)
Sending this note as an FYI to leadership. Ottis Stain, CMA

## 2016-05-06 NOTE — Telephone Encounter (Signed)
Called patient and asked that she not use profanity with staff.  Patient verbalized understandingand stated she cusses when she gets upset, and apologized.  Burna Forts, BSN, RN-BC

## 2016-05-07 ENCOUNTER — Telehealth: Payer: Self-pay | Admitting: Family Medicine

## 2016-05-07 MED ORDER — ALBUTEROL SULFATE HFA 108 (90 BASE) MCG/ACT IN AERS: 2.0000 | INHALATION_SPRAY | RESPIRATORY_TRACT | 5 refills | Status: DC | PRN

## 2016-05-07 MED ORDER — HYDROCHLOROTHIAZIDE 25 MG PO TABS: ORAL_TABLET | ORAL | 3 refills | Status: DC

## 2016-05-07 NOTE — Telephone Encounter (Signed)
**  [place] Proventil and HCTZ.  Refills provided.  Ashly M. Lajuana Ripple, DO PGY-3, [facility] Res[facility]

## 2016-05-08 ENCOUNTER — Telehealth: Payer: Self-pay | Admitting: Internal Medicine

## 2016-05-08 DIAGNOSIS — E785 Hyperlipidemia, unspecified: Secondary | ICD-10-CM

## 2016-05-08 MED ORDER — PRAVASTATIN SODIUM 40 MG PO TABS: ORAL_TABLET | ORAL | 11 refills | Status: DC

## 2016-05-08 MED ORDER — BECLOMETHASONE DIPROPIONATE 80 MCG/ACT IN AERS: 1.0000 | INHALATION_SPRAY | Freq: Two times a day (BID) | RESPIRATORY_TRACT | 12 refills | Status: DC

## 2016-05-08 MED ORDER — ALBUTEROL SULFATE HFA 108 (90 BASE) MCG/ACT IN AERS: 2.0000 | INHALATION_SPRAY | RESPIRATORY_TRACT | 5 refills | Status: DC | PRN

## 2016-05-08 MED ORDER — OMEPRAZOLE 40 MG PO CPDR: 40.0000 mg | DELAYED_RELEASE_CAPSULE | Freq: Every day | ORAL | 11 refills | Status: DC

## 2016-05-08 MED ORDER — HYDROCHLOROTHIAZIDE 25 MG PO TABS: ORAL_TABLET | ORAL | 3 refills | Status: DC

## 2016-05-08 MED ORDER — LEVOTHYROXINE SODIUM 50 MCG PO TABS: 50.0000 ug | ORAL_TABLET | Freq: Every day | ORAL | 3 refills | Status: DC

## 2016-05-08 MED ORDER — PATADAY 0.2 % OP SOLN: 1.0000 [drp] | Freq: Every day | OPHTHALMIC | 5 refills | Status: DC

## 2016-05-08 NOTE — Telephone Encounter (Signed)
**  After Hours/ Emergency Line Call*  Received a call to report that [name] requests refills on the following medications: Albuterol, Pravastatin, Synthroid, HCTZ, Qvar, Prilosec. Rx sent to [facility] as requested.   Smiley Houseman, MD PGY-2, [place] [facility]

## 2016-05-09 ENCOUNTER — Telehealth: Payer: Self-pay | Admitting: *Deleted

## 2016-05-09 NOTE — Telephone Encounter (Signed)
Prior Authorization received from [employer] for Pataday 0.2%. Formulary and PA form placed in provider box for completion. Derl Barrow, RN

## 2016-05-13 ENCOUNTER — Other Ambulatory Visit: Payer: Self-pay | Admitting: Internal Medicine

## 2016-05-13 MED ORDER — OLOPATADINE HCL 0.2 % OP SOLN: 1.0000 [drp] | Freq: Every day | OPHTHALMIC | 2 refills | Status: DC

## 2016-05-13 NOTE — Progress Notes (Addendum)
Received prior authorization for Pataday eye drops. Per chart history, patient does not seem to have tried any of the medications that are on formulary. Therefore discontinued Pataday and ordered Olopatadine drops (for same use).

## 2016-05-13 NOTE — Telephone Encounter (Signed)
Please see separate orders only note.

## 2016-05-17 ENCOUNTER — Other Ambulatory Visit: Payer: Self-pay | Admitting: Internal Medicine

## 2016-05-17 MED ORDER — OLOPATADINE HCL 0.1 % OP SOLN: 1.0000 [drp] | Freq: Two times a day (BID) | OPHTHALMIC | 2 refills | Status: DC

## 2016-05-17 NOTE — Telephone Encounter (Signed)
Ordered Olopatadine 0.1% solution which is covered by formulary.

## 2016-05-17 NOTE — Telephone Encounter (Signed)
Prior Authorization received from [employer] for Olopatadine 0.2%. Formulary preferred per Medicaid is Olopatadine 0.1%.  Please advise. Derl Barrow, RN

## 2016-05-17 NOTE — Progress Notes (Signed)
Received prior authorization for olopatadine 0.2% solution as formulary covers olopatadine 0.1% solution. Switched prescription to Olopatadine 0.1% solution

## 2016-05-17 NOTE — Progress Notes (Signed)
Opened in error

## 2016-05-17 NOTE — Addendum Note (Signed)
Addended by: Smiley Houseman on: 05/17/2016 05:55 PM   Modules accepted: Orders

## 2016-05-28 ENCOUNTER — Encounter (HOSPITAL_COMMUNITY): Payer: Self-pay | Admitting: Emergency Medicine

## 2016-05-28 ENCOUNTER — Emergency Department (HOSPITAL_COMMUNITY)
Admission: EM | Admit: 2016-05-28 | Discharge: 2016-05-28 | Disposition: A | Discharge status: Home / Self Care | Payer: Medicaid Other | Attending: Emergency Medicine | Admitting: Emergency Medicine

## 2016-05-28 ENCOUNTER — Emergency Department (HOSPITAL_COMMUNITY): Payer: Medicaid Other

## 2016-05-28 DIAGNOSIS — Z79899 Other long term (current) drug therapy: Secondary | ICD-10-CM | POA: Diagnosis not present

## 2016-05-28 DIAGNOSIS — M545 Low back pain, unspecified: Secondary | ICD-10-CM

## 2016-05-28 DIAGNOSIS — Z7982 Long term (current) use of aspirin: Secondary | ICD-10-CM | POA: Diagnosis not present

## 2016-05-28 DIAGNOSIS — K5900 Constipation, unspecified: Secondary | ICD-10-CM | POA: Diagnosis present

## 2016-05-28 DIAGNOSIS — I129 Hypertensive chronic kidney disease with stage 1 through stage 4 chronic kidney disease, or unspecified chronic kidney disease: Secondary | ICD-10-CM | POA: Insufficient documentation

## 2016-05-28 DIAGNOSIS — I10 Essential (primary) hypertension: Secondary | ICD-10-CM

## 2016-05-28 DIAGNOSIS — G8929 Other chronic pain: Secondary | ICD-10-CM | POA: Diagnosis not present

## 2016-05-28 DIAGNOSIS — E1122 Type 2 diabetes mellitus with diabetic chronic kidney disease: Secondary | ICD-10-CM | POA: Diagnosis not present

## 2016-05-28 DIAGNOSIS — F319 Bipolar disorder, unspecified: Secondary | ICD-10-CM

## 2016-05-28 DIAGNOSIS — Z760 Encounter for issue of repeat prescription: Secondary | ICD-10-CM | POA: Insufficient documentation

## 2016-05-28 DIAGNOSIS — Z794 Long term (current) use of insulin: Secondary | ICD-10-CM | POA: Diagnosis not present

## 2016-05-28 DIAGNOSIS — N182 Chronic kidney disease, stage 2 (mild): Secondary | ICD-10-CM | POA: Diagnosis not present

## 2016-05-28 DIAGNOSIS — J449 Chronic obstructive pulmonary disease, unspecified: Secondary | ICD-10-CM | POA: Diagnosis not present

## 2016-05-28 DIAGNOSIS — F17210 Nicotine dependence, cigarettes, uncomplicated: Secondary | ICD-10-CM | POA: Diagnosis not present

## 2016-05-28 DIAGNOSIS — E876 Hypokalemia: Secondary | ICD-10-CM

## 2016-05-28 DIAGNOSIS — E039 Hypothyroidism, unspecified: Secondary | ICD-10-CM | POA: Diagnosis not present

## 2016-05-28 LAB — CBC WITH DIFFERENTIAL/PLATELET
Basophils Absolute: 0 10*3/uL (ref 0.0–0.1)
Basophils Relative: 0 %
Eosinophils Absolute: 0 10*3/uL (ref 0.0–0.7)
Eosinophils Relative: 1 %
HCT: 37.1 % (ref 36.0–46.0)
Hemoglobin: 12.9 g/dL (ref 12.0–15.0)
Lymphocytes Relative: 30 %
Lymphs Abs: 2.4 10*3/uL (ref 0.7–4.0)
MCH: 35.1 pg — ABNORMAL HIGH (ref 26.0–34.0)
MCHC: 34.8 g/dL (ref 30.0–36.0)
MCV: 101.1 fL — ABNORMAL HIGH (ref 78.0–100.0)
Monocytes Absolute: 0.9 10*3/uL (ref 0.1–1.0)
Monocytes Relative: 11 %
Neutro Abs: 4.8 10*3/uL (ref 1.7–7.7)
Neutrophils Relative %: 58 %
Platelets: 237 10*3/uL (ref 150–400)
RBC: 3.67 MIL/uL — ABNORMAL LOW (ref 3.87–5.11)
RDW: 14.1 % (ref 11.5–15.5)
WBC: 8.1 10*3/uL (ref 4.0–10.5)

## 2016-05-28 LAB — BASIC METABOLIC PANEL
Anion gap: 8 (ref 5–15)
BUN: 9 mg/dL (ref 6–20)
CO2: 31 mmol/L (ref 22–32)
Calcium: 9.4 mg/dL (ref 8.9–10.3)
Chloride: 96 mmol/L — ABNORMAL LOW (ref 101–111)
Creatinine, Ser: 0.99 mg/dL (ref 0.44–1.00)
GFR calc Af Amer: >60 mL/min (ref >60)
GFR calc non Af Amer: 59 mL/min — ABNORMAL LOW (ref >60)
Glucose, Bld: 139 mg/dL — ABNORMAL HIGH (ref 65–99)
Potassium: 3 mmol/L — ABNORMAL LOW (ref 3.5–5.1)
Sodium: 135 mmol/L (ref 135–145)

## 2016-05-28 MED ORDER — AMLODIPINE BESYLATE 5 MG PO TABS: 5.0000 mg | ORAL_TABLET | Freq: Every day | ORAL | 3 refills | Status: DC

## 2016-05-28 MED ORDER — DEPAKOTE ER 250 MG PO TB24: ORAL_TABLET | ORAL | 1 refills | Status: DC

## 2016-05-28 MED ORDER — IBUPROFEN 800 MG PO TABS: 800.0000 mg | ORAL_TABLET | Freq: Three times a day (TID) | ORAL | 0 refills | Status: DC

## 2016-05-28 MED ORDER — POLYETHYLENE GLYCOL 3350 17 G PO PACK: 17.0000 g | PACK | Freq: Every day | ORAL | 0 refills | Status: DC

## 2016-05-28 MED ORDER — HYDROCHLOROTHIAZIDE 25 MG PO TABS: ORAL_TABLET | ORAL | 3 refills | Status: DC

## 2016-05-28 MED ORDER — DOCUSATE SODIUM 100 MG PO CAPS: 100.0000 mg | ORAL_CAPSULE | Freq: Two times a day (BID) | ORAL | 0 refills | Status: DC

## 2016-05-28 MED ORDER — RISPERIDONE 2 MG PO TABS
1.0000 mg | ORAL_TABLET | Freq: Every day | ORAL | 0 refills | Status: DC
Start: 2016-05-28 — End: 2016-06-13

## 2016-05-28 MED ORDER — POTASSIUM CHLORIDE ER 10 MEQ PO TBCR: EXTENDED_RELEASE_TABLET | ORAL | 1 refills | Status: DC

## 2016-05-28 NOTE — Discharge Instructions (Signed)
Follow up with your doctor next week, take the medications as prescribed

## 2016-05-28 NOTE — ED Notes (Signed)
Pt. Refusing to stand up for xray.

## 2016-05-28 NOTE — ED Notes (Signed)
Pt. Daughter concerned that patient has been off her psych medications since October. EDP made aware.

## 2016-05-28 NOTE — ED Triage Notes (Signed)
Pt states last BM was two days ago. Is out of hydrocodone that she takes for lower chronic lower back pain. Ambulates unassisted. No obvious signs of distress.

## 2016-05-28 NOTE — ED Provider Notes (Signed)
[place] DEPT Provider Note   CSN: [id number] Arrival date & time: 05/28/16  1039     History   Chief Complaint Chief Complaint  Patient presents with  . Back Pain  . Constipation    HPI [name] Long is a 65 y.o. female.  Pt has some chronic back pain issues.  She takes hydrocodone for the pain.  For the last few days she started having trouble with constipation.  She has trouble with nausea but no vomiting.  She feels like she has to have a BM but cant.   The history is provided by the patient.  Back Pain   Associated symptoms include abdominal pain (lower abdomen). Pertinent negatives include no dysuria.  Constipation   This is a new problem. The current episode started more than 2 days ago. The stool is described as firm. Associated symptoms include abdominal pain (lower abdomen). Pertinent negatives include no dysuria. Treatments tried: otc meds. The treatment provided no relief. Past medical history comments: Pt does take opiates for back pain.    Past Medical History:  Diagnosis Date  . BIPOLAR DISORDER 08/31/2006   Qualifier: Diagnosis of  By: Dorathy Daft MD, Marjory Lies    . CHRONIC KIDNEY DISEASE STAGE II (MILD) 09/14/2009   Annotation: eGFR 64 Qualifier: Diagnosis of  By: Jess Barters MD, [name]    . COPD (chronic obstructive pulmonary disease) ([place]) 09/23/2010   Diagnosed at [facility] in 2008 (Dr. Annamaria Boots)   . DM (diabetes mellitus) type II controlled with renal manifestation ([place]) 08/31/2006   Qualifier: Diagnosis of  By: Dorathy Daft MD, Marjory Lies    . HYPERCHOLESTEROLEMIA 08/31/2006   Intolerance to Lipitor OK on Crestor but medicaid no longer covering    . HYPERTENSION, BENIGN SYSTEMIC 08/31/2006   Qualifier: Diagnosis of  By: Dorathy Daft MD, Marjory Lies    . HYPOTHYROIDISM, UNSPECIFIED 08/31/2006   Qualifier: Diagnosis of  By: Dorathy Daft MD, Marjory Lies      Patient Active Problem List   Diagnosis Date Noted  . URI (upper respiratory infection) 03/09/2016  . Muscle strain of chest  wall 10/21/2015  . Activities of daily living deficit involving total body bathing 08/10/2015  . Hypokalemia 01/22/2015  . Vitamin D deficiency 12/19/2014  . Preventative health care 03/21/2014  . Bronchitis, chronic obstructive w acute bronchitis ([place]) 01/07/2014  . Osteoarthritis, multiple sites 06/08/2011  . Vaginal discharge 01/14/2011  . COPD (chronic obstructive pulmonary disease) ([place]) 09/23/2010  . CHRONIC KIDNEY DISEASE STAGE II (MILD) 09/14/2009  . Hypothyroidism 08/31/2006  . Type 2 diabetes, diet controlled ([place]) 08/31/2006  . HYPERCHOLESTEROLEMIA 08/31/2006  . BIPOLAR DISORDER 08/31/2006  . TOBACCO DEPENDENCE 08/31/2006  . HYPERTENSION, BENIGN SYSTEMIC 08/31/2006    Past Surgical History:  Procedure Laterality Date  . CESAREAN SECTION    . FOOT SURGERY    . KNEE SURGERY Bilateral     OB History    No data available       Home Medications    Prior to Admission medications   Medication Sig Start Date End Date Taking? Authorizing Provider  albuterol (PROVENTIL HFA) 108 (90 Base) MCG/ACT inhaler Inhale 2 puffs into the lungs every 4 (four) hours as needed. For wheezing 05/08/16   Smiley Houseman, MD  amLODipine (NORVASC) 5 MG tablet Take 1 tablet (5 mg total) by mouth daily. 05/28/16   Dorie Rank, MD  aspirin (BAYER CHILDRENS ASPIRIN) 81 MG chewable tablet Chew 81 mg by mouth daily.      Historical Provider, MD  beclomethasone (QVAR) 80 MCG/ACT  inhaler Inhale 1 puff into the lungs 2 (two) times daily. 05/08/16   Smiley Houseman, MD  DEPAKOTE ER 250 MG 24 hr tablet take 1 tablet by mouth twice a day and 2 tablets at bedtime 05/28/16   Dorie Rank, MD  docusate sodium (COLACE) 100 MG capsule Take 1 capsule (100 mg total) by mouth every 12 (twelve) hours. 05/28/16   Dorie Rank, MD  fluticasone (FLONASE) 50 MCG/ACT nasal spray Place 2 sprays into both nostrils daily. 12/21/15   [name], DO  glucose blood (ACCU-CHEK AVIVA PLUS) test strip Use as instructed  04/21/15   [name], DO  hydrochlorothiazide (HYDRODIURIL) 25 MG tablet take 1 tablet by mouth once daily for blood pressure 05/28/16   Dorie Rank, MD  ibuprofen (ADVIL,MOTRIN) 800 MG tablet Take 1 tablet (800 mg total) by mouth 3 (three) times daily. 05/28/16   Dorie Rank, MD  Lancets (ACCU-CHEK MULTICLIX) lancets TEST BLOOD SUGAR 1 TO 3 TIMES PER WEEK 01/20/16   [name], DO  levothyroxine (SYNTHROID) 50 MCG tablet Take 1 tablet (50 mcg total) by mouth daily before breakfast. 05/08/16   Smiley Houseman, MD  meloxicam (MOBIC) 15 MG tablet Take 1 tablet (15 mg total) by mouth daily. 09/18/15   [name], DO  metFORMIN (GLUCOPHAGE) 500 MG tablet Take 1 tablet by mouth once daily WITH BREAKFAST 12/21/15   [name], DO  metroNIDAZOLE (FLAGYL) 500 MG tablet Take 1 tablet (500 mg total) by mouth 2 (two) times daily. x7 days. 02/26/15   [name], DO  naproxen (NAPROSYN) 500 MG tablet Take 1 tablet daily as needed for pain 03/29/16   [name], DO  nitroGLYCERIN (NITROSTAT) 0.4 MG SL tablet Place 1 tablet (0.4 mg total) under the tongue every 5 (five) minutes as needed for chest pain. 02/26/15   [name], DO  olopatadine (PATANOL) 0.1 % ophthalmic solution Place 1 drop into both eyes 2 (two) times daily. 05/17/16   Smiley Houseman, MD  omeprazole (PRILOSEC) 40 MG capsule Take 1 capsule (40 mg total) by mouth daily. 05/08/16   Smiley Houseman, MD  polyethylene glycol [facility]) packet Take 17 g by mouth daily. 05/28/16   Dorie Rank, MD  potassium chloride (K-DUR) 10 MEQ tablet Take 2 tablets in am and 2 tablets in pm x1 day.  Then take 1 tablet daily. 05/28/16   Dorie Rank, MD  pravastatin (PRAVACHOL) 40 MG tablet take 1 tablet by mouth every evening 05/08/16   Smiley Houseman, MD  predniSONE (DELTASONE) 20 MG tablet Take 2 tablets (40 mg total) by mouth daily with breakfast. For the next four days 01/09/16   [name], MD  risperiDONE  (RISPERDAL) 2 MG tablet Take 0.5 tablets (1 mg total) by mouth at bedtime. 05/28/16   Dorie Rank, MD    Family History History reviewed. No pertinent family history.  Social History Social History  Substance Use Topics  . Smoking status: Current Every Day Smoker    Packs/day: 0.00    Years: 39.00    Types: Cigarettes  . Smokeless tobacco: Not on file  . Alcohol use Yes     Allergies   Ibuprofen; Lipitor [atorvastatin calcium]; Lisinopril; and Codeine   Review of Systems Review of Systems  Gastrointestinal: Positive for abdominal pain (lower abdomen) and constipation.  Genitourinary: Negative for dysuria.  Musculoskeletal: Positive for back pain.  All other systems reviewed and are negative.    Physical  Exam Updated Vital Signs BP 111/70 (BP Location: Right Arm)   Pulse 65   Temp 98 F (36.7 C) (Oral)   Resp 16   Ht '5\' 4"'  (1.626 m)   Wt 67.4 kg   SpO2 97%   BMI 25.51 kg/m   Physical Exam  Constitutional: She appears well-developed and well-nourished. No distress.  HENT:  Head: Normocephalic and atraumatic.  Right Ear: External ear normal.  Left Ear: External ear normal.  Eyes: Conjunctivae are normal. Right eye exhibits no discharge. Left eye exhibits no discharge. No scleral icterus.  Neck: Neck supple. No tracheal deviation present.  Cardiovascular: Normal rate, regular rhythm and intact distal pulses.   Pulmonary/Chest: Effort normal and breath sounds normal. No stridor. No respiratory distress. She has no wheezes. She has no rales.  Abdominal: Soft. Bowel sounds are normal. She exhibits no distension. There is no tenderness. There is no rebound and no guarding.  Genitourinary: Rectal exam shows no external hemorrhoid, no internal hemorrhoid, no fissure and no tenderness.  Genitourinary Comments: Brown stool in the rectum proximally, clay consistency, no fecal impaction  Musculoskeletal: She exhibits no edema or tenderness.  Neurological: She is alert. She  has normal strength. No cranial nerve deficit (no facial droop, extraocular movements intact, no slurred speech) or sensory deficit. She exhibits normal muscle tone. She displays no seizure activity. Coordination normal.  Skin: Skin is warm and dry. No rash noted.  Psychiatric: She has a normal mood and affect.  Nursing note and vitals reviewed.    ED Treatments / Results  Labs (all labs ordered are listed, but only abnormal results are displayed) Labs Reviewed  CBC WITH DIFFERENTIAL/PLATELET - Abnormal; Notable for the following:       Result Value   RBC 3.67 (*)    MCV 101.1 (*)    MCH 35.1 (*)    All other components within normal limits  BASIC METABOLIC PANEL - Abnormal; Notable for the following:    Potassium 3.0 (*)    Chloride 96 (*)    Glucose, Bld 139 (*)    GFR calc non Af Amer 59 (*)    All other components within normal limits    Radiology Dg Abd 1 View  Result Date: 05/28/2016 CLINICAL DATA:  Constipation.  Abdominal pain. EXAM: ABDOMEN - 1 VIEW COMPARISON:  None. FINDINGS: No disproportionately dilated small bowel loops. Moderate colorectal stool volume. No evidence of pneumatosis or pneumoperitoneum. Abdominal aortic atherosclerosis. Mild levocurvature of the lumbar spine. Moderate lumbar spondylosis. IMPRESSION: Nonobstructive bowel gas pattern. Moderate colorectal stool volume, suggesting constipation. Aortic atherosclerosis. Electronically Signed   By: Ilona Sorrel M.D.   On: 05/28/2016 12:38    Procedures Procedures (including critical care time)  Medications Ordered in ED Medications - No data to display   Initial Impression / Assessment and Plan / ED Course  I have reviewed the triage vital signs and the nursing notes.  Pertinent labs & imaging results that were available during my care of the patient were reviewed by me and considered in my medical decision making (see chart for details).  Clinical Course   Patient's laboratory tests are reassuring.  Abdominal x-ray suggests constipation which correlates with her physical exam. I doubt diverticulitis or other acute infection. I suspect her constipation is from her hydrocodone use.  Patient also has a history of mental health problems. She has not been taking her medications. She tried to get them refilled at [facility] but was not able to. Family members asked  if I could refill some of her medications.  We can certainly do that today. I recommend she follow up with her primary care doctor next week. Final Clinical Impressions(s) / ED Diagnoses   Final diagnoses:  Constipation, unspecified constipation type  Chronic low back pain without sciatica, unspecified back pain laterality  Medication refill    New Prescriptions New Prescriptions   DOCUSATE SODIUM (COLACE) 100 MG CAPSULE    Take 1 capsule (100 mg total) by mouth every 12 (twelve) hours.   IBUPROFEN (ADVIL,MOTRIN) 800 MG TABLET    Take 1 tablet (800 mg total) by mouth 3 (three) times daily.   POLYETHYLENE GLYCOL (MIRALAX) PACKET    Take 17 g by mouth daily.     Dorie Rank, MD 05/28/16 1254

## 2016-05-28 NOTE — ED Notes (Signed)
Pt. Refusing to put on gown at this time.

## 2016-06-10 ENCOUNTER — Encounter (HOSPITAL_COMMUNITY): Payer: Self-pay

## 2016-06-10 ENCOUNTER — Inpatient Hospital Stay (HOSPITAL_COMMUNITY)
Admission: EM | Admit: 2016-06-10 | Discharge: 2016-06-13 | DRG: 641 | Disposition: A | Discharge status: Other Acute Inpatient Hospital | Payer: Medicaid Other | Attending: Family Medicine | Admitting: Family Medicine

## 2016-06-10 DIAGNOSIS — F22 Delusional disorders: Secondary | ICD-10-CM | POA: Diagnosis present

## 2016-06-10 DIAGNOSIS — Z7952 Long term (current) use of systemic steroids: Secondary | ICD-10-CM

## 2016-06-10 DIAGNOSIS — E039 Hypothyroidism, unspecified: Secondary | ICD-10-CM | POA: Diagnosis present

## 2016-06-10 DIAGNOSIS — Z79899 Other long term (current) drug therapy: Secondary | ICD-10-CM

## 2016-06-10 DIAGNOSIS — R000 Tachycardia, unspecified: Secondary | ICD-10-CM | POA: Diagnosis present

## 2016-06-10 DIAGNOSIS — E7800 Pure hypercholesterolemia, unspecified: Secondary | ICD-10-CM | POA: Diagnosis present

## 2016-06-10 DIAGNOSIS — Z7984 Long term (current) use of oral hypoglycemic drugs: Secondary | ICD-10-CM

## 2016-06-10 DIAGNOSIS — Z791 Long term (current) use of non-steroidal anti-inflammatories (NSAID): Secondary | ICD-10-CM

## 2016-06-10 DIAGNOSIS — J449 Chronic obstructive pulmonary disease, unspecified: Secondary | ICD-10-CM | POA: Diagnosis present

## 2016-06-10 DIAGNOSIS — I129 Hypertensive chronic kidney disease with stage 1 through stage 4 chronic kidney disease, or unspecified chronic kidney disease: Secondary | ICD-10-CM | POA: Diagnosis present

## 2016-06-10 DIAGNOSIS — Z7982 Long term (current) use of aspirin: Secondary | ICD-10-CM

## 2016-06-10 DIAGNOSIS — Z7951 Long term (current) use of inhaled steroids: Secondary | ICD-10-CM

## 2016-06-10 DIAGNOSIS — F250 Schizoaffective disorder, bipolar type: Secondary | ICD-10-CM

## 2016-06-10 DIAGNOSIS — E559 Vitamin D deficiency, unspecified: Secondary | ICD-10-CM | POA: Diagnosis present

## 2016-06-10 DIAGNOSIS — F3181 Bipolar II disorder: Secondary | ICD-10-CM | POA: Diagnosis present

## 2016-06-10 DIAGNOSIS — E1122 Type 2 diabetes mellitus with diabetic chronic kidney disease: Secondary | ICD-10-CM | POA: Diagnosis present

## 2016-06-10 DIAGNOSIS — Z886 Allergy status to analgesic agent status: Secondary | ICD-10-CM

## 2016-06-10 DIAGNOSIS — Z888 Allergy status to other drugs, medicaments and biological substances status: Secondary | ICD-10-CM

## 2016-06-10 DIAGNOSIS — F259 Schizoaffective disorder, unspecified: Secondary | ICD-10-CM

## 2016-06-10 DIAGNOSIS — N182 Chronic kidney disease, stage 2 (mild): Secondary | ICD-10-CM | POA: Diagnosis present

## 2016-06-10 DIAGNOSIS — F29 Unspecified psychosis not due to a substance or known physiological condition: Secondary | ICD-10-CM

## 2016-06-10 DIAGNOSIS — Z885 Allergy status to narcotic agent status: Secondary | ICD-10-CM

## 2016-06-10 DIAGNOSIS — F17210 Nicotine dependence, cigarettes, uncomplicated: Secondary | ICD-10-CM | POA: Diagnosis present

## 2016-06-10 DIAGNOSIS — E876 Hypokalemia: Principal | ICD-10-CM | POA: Diagnosis present

## 2016-06-10 LAB — ETHANOL: Alcohol, Ethyl (B): <5 mg/dL (ref <5)

## 2016-06-10 LAB — URINALYSIS, ROUTINE W REFLEX MICROSCOPIC
Bilirubin Urine: NEGATIVE
Glucose, UA: NEGATIVE mg/dL
Hgb urine dipstick: NEGATIVE
Ketones, ur: NEGATIVE mg/dL
Leukocytes, UA: NEGATIVE
Nitrite: NEGATIVE
Protein, ur: NEGATIVE mg/dL
Specific Gravity, Urine: 1.002 — ABNORMAL LOW (ref 1.005–1.030)
pH: 7 (ref 5.0–8.0)

## 2016-06-10 LAB — COMPREHENSIVE METABOLIC PANEL
ALT: 16 U/L (ref 14–54)
AST: 21 U/L (ref 15–41)
Albumin: 3.6 g/dL (ref 3.5–5.0)
Alkaline Phosphatase: 81 U/L (ref 38–126)
Anion gap: 13 (ref 5–15)
BUN: 5 mg/dL — ABNORMAL LOW (ref 6–20)
CO2: 30 mmol/L (ref 22–32)
Calcium: 9.5 mg/dL (ref 8.9–10.3)
Chloride: 90 mmol/L — ABNORMAL LOW (ref 101–111)
Creatinine, Ser: 0.88 mg/dL (ref 0.44–1.00)
GFR calc Af Amer: >60 mL/min (ref >60)
GFR calc non Af Amer: >60 mL/min (ref >60)
Glucose, Bld: 180 mg/dL — ABNORMAL HIGH (ref 65–99)
Potassium: <2 mmol/L — CL (ref 3.5–5.1)
Sodium: 133 mmol/L — ABNORMAL LOW (ref 135–145)
Total Bilirubin: 0.4 mg/dL (ref 0.3–1.2)
Total Protein: 7.2 g/dL (ref 6.5–8.1)

## 2016-06-10 LAB — RAPID URINE DRUG SCREEN, HOSP PERFORMED
Amphetamines: NOT DETECTED
Barbiturates: NOT DETECTED
Benzodiazepines: NOT DETECTED
Cocaine: NOT DETECTED
Opiates: NOT DETECTED
Tetrahydrocannabinol: NOT DETECTED

## 2016-06-10 LAB — CBC
HCT: 34.1 % — ABNORMAL LOW (ref 36.0–46.0)
Hemoglobin: 12.1 g/dL (ref 12.0–15.0)
MCH: 35.5 pg — ABNORMAL HIGH (ref 26.0–34.0)
MCHC: 35.5 g/dL (ref 30.0–36.0)
MCV: 100 fL (ref 78.0–100.0)
Platelets: 230 10*3/uL (ref 150–400)
RBC: 3.41 MIL/uL — ABNORMAL LOW (ref 3.87–5.11)
RDW: 13.3 % (ref 11.5–15.5)
WBC: 6.6 10*3/uL (ref 4.0–10.5)

## 2016-06-10 LAB — BASIC METABOLIC PANEL
Anion gap: 11 (ref 5–15)
BUN: <5 mg/dL — ABNORMAL LOW (ref 6–20)
CO2: 31 mmol/L (ref 22–32)
Calcium: 8.8 mg/dL — ABNORMAL LOW (ref 8.9–10.3)
Chloride: 97 mmol/L — ABNORMAL LOW (ref 101–111)
Creatinine, Ser: 0.82 mg/dL (ref 0.44–1.00)
GFR calc Af Amer: >60 mL/min (ref >60)
GFR calc non Af Amer: >60 mL/min (ref >60)
Glucose, Bld: 105 mg/dL — ABNORMAL HIGH (ref 65–99)
Potassium: 2.1 mmol/L — CL (ref 3.5–5.1)
Sodium: 139 mmol/L (ref 135–145)

## 2016-06-10 LAB — CBC WITH DIFFERENTIAL/PLATELET
Basophils Absolute: 0 10*3/uL (ref 0.0–0.1)
Basophils Relative: 0 %
Eosinophils Absolute: 0.2 10*3/uL (ref 0.0–0.7)
Eosinophils Relative: 2 %
HCT: 38.4 % (ref 36.0–46.0)
Hemoglobin: 13.7 g/dL (ref 12.0–15.0)
Lymphocytes Relative: 42 %
Lymphs Abs: 3.7 10*3/uL (ref 0.7–4.0)
MCH: 35.6 pg — ABNORMAL HIGH (ref 26.0–34.0)
MCHC: 35.7 g/dL (ref 30.0–36.0)
MCV: 99.7 fL (ref 78.0–100.0)
Monocytes Absolute: 0.7 10*3/uL (ref 0.1–1.0)
Monocytes Relative: 8 %
Neutro Abs: 4.2 10*3/uL (ref 1.7–7.7)
Neutrophils Relative %: 48 %
Platelets: 280 10*3/uL (ref 150–400)
RBC: 3.85 MIL/uL — ABNORMAL LOW (ref 3.87–5.11)
RDW: 13.3 % (ref 11.5–15.5)
WBC: 8.7 10*3/uL (ref 4.0–10.5)

## 2016-06-10 LAB — GLUCOSE, CAPILLARY
Glucose-Capillary: 83 mg/dL (ref 65–99)
Glucose-Capillary: 134 mg/dL — ABNORMAL HIGH (ref 65–99)
Glucose-Capillary: 124 mg/dL — ABNORMAL HIGH (ref 65–99)
Glucose-Capillary: 145 mg/dL — ABNORMAL HIGH (ref 65–99)

## 2016-06-10 LAB — PHOSPHORUS: Phosphorus: 3.7 mg/dL (ref 2.5–4.6)

## 2016-06-10 LAB — TSH: TSH: 0.466 u[IU]/mL (ref 0.350–4.500)

## 2016-06-10 LAB — VALPROIC ACID LEVEL: Valproic Acid Lvl: <10 ug/mL — ABNORMAL LOW (ref 50.0–100.0)

## 2016-06-10 LAB — MAGNESIUM: Magnesium: 1.8 mg/dL (ref 1.7–2.4)

## 2016-06-10 LAB — POTASSIUM
Potassium: <2 mmol/L — CL (ref 3.5–5.1)
Potassium: 3.1 mmol/L — ABNORMAL LOW (ref 3.5–5.1)
Potassium: 3.4 mmol/L — ABNORMAL LOW (ref 3.5–5.1)

## 2016-06-10 MED ORDER — POTASSIUM CHLORIDE IN NACL 40-0.9 MEQ/L-% IV SOLN
INTRAVENOUS | Status: AC
  Administered 2016-06-10 (×2): 125 mL/h via INTRAVENOUS
  Filled 2016-06-10 (×4): qty 1000

## 2016-06-10 MED ORDER — VALPROATE SODIUM 500 MG/5ML IV SOLN
500.0000 mg | Freq: Every day | INTRAVENOUS | Status: DC
  Administered 2016-06-10: 500 mg via INTRAVENOUS
  Filled 2016-06-10: qty 5

## 2016-06-10 MED ORDER — LORAZEPAM 2 MG/ML IJ SOLN
0.5000 mg | Freq: Once | INTRAMUSCULAR | Status: AC | PRN
  Administered 2016-06-10: 0.5 mg via INTRAVENOUS
  Filled 2016-06-10: qty 1

## 2016-06-10 MED ORDER — PRAVASTATIN SODIUM 40 MG PO TABS
40.0000 mg | ORAL_TABLET | Freq: Every day | ORAL | Status: DC
  Administered 2016-06-11 – 2016-06-12 (×2): 40 mg via ORAL
  Filled 2016-06-10 (×3): qty 1

## 2016-06-10 MED ORDER — ZIPRASIDONE MESYLATE 20 MG IM SOLR
10.0000 mg | Freq: Once | INTRAMUSCULAR | Status: AC
  Administered 2016-06-10: 10 mg via INTRAMUSCULAR
  Filled 2016-06-10: qty 20

## 2016-06-10 MED ORDER — POTASSIUM CHLORIDE CRYS ER 20 MEQ PO TBCR: 40.0000 meq | EXTENDED_RELEASE_TABLET | Freq: Three times a day (TID) | ORAL | Status: DC

## 2016-06-10 MED ORDER — LORAZEPAM 2 MG/ML IJ SOLN
1.0000 mg | Freq: Once | INTRAMUSCULAR | Status: AC
  Administered 2016-06-10: 1 mg via INTRAVENOUS
  Filled 2016-06-10: qty 1

## 2016-06-10 MED ORDER — SODIUM CHLORIDE 0.9 % IV SOLN
30.0000 meq | Freq: Once | INTRAVENOUS | Status: AC
  Administered 2016-06-10: 30 meq via INTRAVENOUS
  Filled 2016-06-10: qty 15

## 2016-06-10 MED ORDER — INSULIN ASPART 100 UNIT/ML ~~LOC~~ SOLN
0.0000 [IU] | Freq: Three times a day (TID) | SUBCUTANEOUS | Status: DC
  Administered 2016-06-11 – 2016-06-12 (×3): 1 [IU] via SUBCUTANEOUS

## 2016-06-10 MED ORDER — LORAZEPAM 1 MG PO TABS
1.0000 mg | ORAL_TABLET | Freq: Three times a day (TID) | ORAL | Status: DC | PRN
  Administered 2016-06-11 – 2016-06-12 (×3): 1 mg via ORAL
  Filled 2016-06-10: qty 1
  Filled 2016-06-10: qty 2
  Filled 2016-06-10 (×2): qty 1

## 2016-06-10 MED ORDER — POTASSIUM CHLORIDE CRYS ER 20 MEQ PO TBCR: 40.0000 meq | EXTENDED_RELEASE_TABLET | Freq: Once | ORAL | Status: DC

## 2016-06-10 MED ORDER — STERILE WATER FOR INJECTION IJ SOLN
INTRAMUSCULAR | Status: AC
  Administered 2016-06-10: 0.6 mL
  Filled 2016-06-10: qty 10

## 2016-06-10 MED ORDER — ACETAMINOPHEN 325 MG PO TABS
650.0000 mg | ORAL_TABLET | ORAL | Status: DC | PRN
  Administered 2016-06-12: 650 mg via ORAL
  Filled 2016-06-10: qty 2

## 2016-06-10 MED ORDER — ONDANSETRON HCL 4 MG PO TABS: 4.0000 mg | ORAL_TABLET | Freq: Three times a day (TID) | ORAL | Status: DC | PRN

## 2016-06-10 MED ORDER — ENOXAPARIN SODIUM 40 MG/0.4ML ~~LOC~~ SOLN
40.0000 mg | SUBCUTANEOUS | Status: DC
  Administered 2016-06-11: 40 mg via SUBCUTANEOUS
  Filled 2016-06-10 (×2): qty 0.4

## 2016-06-10 MED ORDER — ASPIRIN 81 MG PO CHEW
81.0000 mg | CHEWABLE_TABLET | Freq: Every day | ORAL | Status: DC
  Administered 2016-06-11 – 2016-06-12 (×2): 81 mg via ORAL
  Filled 2016-06-10 (×3): qty 1

## 2016-06-10 MED ORDER — LEVOTHYROXINE SODIUM 50 MCG PO TABS
50.0000 ug | ORAL_TABLET | Freq: Every day | ORAL | Status: DC
  Administered 2016-06-11 – 2016-06-13 (×3): 50 ug via ORAL
  Filled 2016-06-10 (×3): qty 1

## 2016-06-10 MED ORDER — LORAZEPAM 2 MG/ML IJ SOLN: 0.5000 mg | Freq: Once | INTRAMUSCULAR | Status: DC | PRN

## 2016-06-10 NOTE — ED Notes (Signed)
Pt's daughter took all of pt's belongings home. Pt's clothing, pocket book and cane given to pt's daughter who lives with her.
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
## 2016-06-10 NOTE — Progress Notes (Signed)
Patient agitated and coming to nurses' station asking about her belongings and demanding the [employer] be called to have belongings returned. She returned to her room and is eating chicken salad calmly. However, she is still perseverating about her belongings. She says she is "special" and can "talk to the whole world" but needs her phone to do so. She engaged with me until I let her know we could not get her phone back to her tonight then asked that I leave since I could not fix that issue for her.  Valproic acid level resulted as < 10. After speaking with pharmacy to confirm 1:1 dosing, reordered bedtime dose of 500 mg by IV, as patient is refusing all PO medications and seems to think they have germs. Have asked pharmacist for recommendations on best anti-psychotic if needed given prolonged QTc. Will consult Psychiatry again tomorrow, 12/9.

## 2016-06-10 NOTE — Progress Notes (Signed)
Patient refusing all PO meds. Refusing lab work and refusing all visitors at this time. IV K+ is still infusing. Awaiting Psyche to see patient. Daughter [name] Long is hanging around the hospital to speak with MD when MD arrives. Will continue to monitor.  Sheliah Plane RN

## 2016-06-10 NOTE — Progress Notes (Signed)
06/10/16 05:40 Patient arrived from ED with sitter and NT. No paper works received. [place], Wonda Cheng, Therapist, sports

## 2016-06-10 NOTE — ED Notes (Signed)
Patient spoke with this nurse and stated all she needed was prescriptions for her meds and she could go home.  Explained to her she would need to talk with our psychiatrist but she stated she has her own and was not going to talk to our doctor

## 2016-06-10 NOTE — Progress Notes (Signed)
Made aware that IVC paperwork did not arrive on unit when patient was admitted. Called and spoke with Frances Nickels at the [place] office [phone].  Per Ms. Frances Nickels, once the paperwork is served by the police, 3 signed copies are created which stay at the hospital. The copies that she has are unsigned and unenforceable.  Stated that once the paperwork is served it starts a 10 day clock and if the patient is still held by day 10 they have a hearing. Stated that redoing the paperwork restarts the clock.   Called and spoke with Lionel December, DD for the Emergency Department to discuss. Stated that he is in the process of contacting the nurse caring for the patient last night and working to find out the location of the paperwork.   Crawford Givens and 6E CN made aware.   [place], Bryn Gulling
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
## 2016-06-10 NOTE — Progress Notes (Signed)
New Admission Note:   Arrival Method: Via stretcher from [facility] ED Mental Orientation: Alert and oriented but drowsy from medicatuion Telemetry: Box #12 Assessment: Completed Skin: Intact IV: LFA Pain: Denies Tubes: N/A Safety Measures: Safety Fall Prevention Plan has been given, discussed. Admission: Completed [address] Orientation: Patient has been orientated to the room, unit and staff.  Family: daughter at [place] in the room  Orders have been reviewed and implemented. Will continue to monitor the patient. Call light has been placed within reach and bed alarm has been activated.   [employer], RN-BC Phone number: [phone]

## 2016-06-10 NOTE — ED Notes (Signed)
Security assisted with administration of Geodon
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
## 2016-06-10 NOTE — ED Notes (Signed)
IVC paperwork served by Police. EKG and blood work obtained. Pt sitting in room, calm and cooperative at this time. Pt belongings obtained by this RN
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
## 2016-06-10 NOTE — ED Notes (Addendum)
Lab called with critical potassium reading of less than 2.0. Dr. Betsey Holiday notified and to order serum potassium

## 2016-06-10 NOTE — Progress Notes (Signed)
Patients K+2.1. Paged oncall for family medicine.

## 2016-06-10 NOTE — Progress Notes (Signed)
IVC paperwork obtained from ED.  Copies placed in patient's chart.  Jillyn Ledger, MBA, BSN, RN

## 2016-06-10 NOTE — ED Notes (Signed)
Pt in restroom with sitter to give urine specimen

## 2016-06-11 DIAGNOSIS — Z7982 Long term (current) use of aspirin: Secondary | ICD-10-CM | POA: Diagnosis not present

## 2016-06-11 DIAGNOSIS — E1122 Type 2 diabetes mellitus with diabetic chronic kidney disease: Secondary | ICD-10-CM | POA: Diagnosis present

## 2016-06-11 DIAGNOSIS — Z886 Allergy status to analgesic agent status: Secondary | ICD-10-CM | POA: Diagnosis not present

## 2016-06-11 DIAGNOSIS — F259 Schizoaffective disorder, unspecified: Secondary | ICD-10-CM

## 2016-06-11 DIAGNOSIS — E039 Hypothyroidism, unspecified: Secondary | ICD-10-CM | POA: Diagnosis present

## 2016-06-11 DIAGNOSIS — F17210 Nicotine dependence, cigarettes, uncomplicated: Secondary | ICD-10-CM | POA: Diagnosis present

## 2016-06-11 DIAGNOSIS — N182 Chronic kidney disease, stage 2 (mild): Secondary | ICD-10-CM | POA: Diagnosis present

## 2016-06-11 DIAGNOSIS — Z7952 Long term (current) use of systemic steroids: Secondary | ICD-10-CM | POA: Diagnosis not present

## 2016-06-11 DIAGNOSIS — F312 Bipolar disorder, current episode manic severe with psychotic features: Secondary | ICD-10-CM | POA: Diagnosis not present

## 2016-06-11 DIAGNOSIS — F29 Unspecified psychosis not due to a substance or known physiological condition: Secondary | ICD-10-CM

## 2016-06-11 DIAGNOSIS — Z888 Allergy status to other drugs, medicaments and biological substances status: Secondary | ICD-10-CM | POA: Diagnosis not present

## 2016-06-11 DIAGNOSIS — Z791 Long term (current) use of non-steroidal anti-inflammatories (NSAID): Secondary | ICD-10-CM | POA: Diagnosis not present

## 2016-06-11 DIAGNOSIS — F250 Schizoaffective disorder, bipolar type: Secondary | ICD-10-CM

## 2016-06-11 DIAGNOSIS — E7800 Pure hypercholesterolemia, unspecified: Secondary | ICD-10-CM | POA: Diagnosis present

## 2016-06-11 DIAGNOSIS — J449 Chronic obstructive pulmonary disease, unspecified: Secondary | ICD-10-CM | POA: Diagnosis present

## 2016-06-11 DIAGNOSIS — F22 Delusional disorders: Secondary | ICD-10-CM | POA: Diagnosis present

## 2016-06-11 DIAGNOSIS — Z7951 Long term (current) use of inhaled steroids: Secondary | ICD-10-CM | POA: Diagnosis not present

## 2016-06-11 DIAGNOSIS — E876 Hypokalemia: Principal | ICD-10-CM

## 2016-06-11 DIAGNOSIS — Z79899 Other long term (current) drug therapy: Secondary | ICD-10-CM

## 2016-06-11 DIAGNOSIS — Z7984 Long term (current) use of oral hypoglycemic drugs: Secondary | ICD-10-CM | POA: Diagnosis not present

## 2016-06-11 DIAGNOSIS — I129 Hypertensive chronic kidney disease with stage 1 through stage 4 chronic kidney disease, or unspecified chronic kidney disease: Secondary | ICD-10-CM | POA: Diagnosis present

## 2016-06-11 DIAGNOSIS — Z885 Allergy status to narcotic agent status: Secondary | ICD-10-CM | POA: Diagnosis not present

## 2016-06-11 DIAGNOSIS — F3181 Bipolar II disorder: Secondary | ICD-10-CM | POA: Diagnosis present

## 2016-06-11 DIAGNOSIS — F309 Manic episode, unspecified: Secondary | ICD-10-CM | POA: Diagnosis not present

## 2016-06-11 DIAGNOSIS — E559 Vitamin D deficiency, unspecified: Secondary | ICD-10-CM | POA: Diagnosis present

## 2016-06-11 DIAGNOSIS — R000 Tachycardia, unspecified: Secondary | ICD-10-CM | POA: Diagnosis present

## 2016-06-11 DIAGNOSIS — F3113 Bipolar disorder, current episode manic without psychotic features, severe: Secondary | ICD-10-CM | POA: Diagnosis not present

## 2016-06-11 LAB — HEMOGLOBIN A1C
Hgb A1c MFr Bld: 6.3 % — ABNORMAL HIGH (ref 4.8–5.6)
Mean Plasma Glucose: 134 mg/dL

## 2016-06-11 LAB — GLUCOSE, CAPILLARY
Glucose-Capillary: 107 mg/dL — ABNORMAL HIGH (ref 65–99)
Glucose-Capillary: 104 mg/dL — ABNORMAL HIGH (ref 65–99)

## 2016-06-11 MED ORDER — DIVALPROEX SODIUM ER 500 MG PO TB24
500.0000 mg | ORAL_TABLET | Freq: Two times a day (BID) | ORAL | Status: DC
  Administered 2016-06-11 – 2016-06-12 (×3): 500 mg via ORAL
  Filled 2016-06-11 (×4): qty 1

## 2016-06-11 MED ORDER — POTASSIUM CHLORIDE CRYS ER 20 MEQ PO TBCR
20.0000 meq | EXTENDED_RELEASE_TABLET | Freq: Once | ORAL | Status: AC
  Administered 2016-06-11: 20 meq via ORAL
  Filled 2016-06-11: qty 1

## 2016-06-11 MED ORDER — DIVALPROEX SODIUM ER 250 MG PO TB24
250.0000 mg | ORAL_TABLET | Freq: Two times a day (BID) | ORAL | Status: DC
  Administered 2016-06-11: 250 mg via ORAL
  Filled 2016-06-11 (×2): qty 1

## 2016-06-11 MED ORDER — OLANZAPINE 10 MG IM SOLR
10.0000 mg | Freq: Once | INTRAMUSCULAR | Status: DC | PRN
  Filled 2016-06-11: qty 10

## 2016-06-11 MED ORDER — OLANZAPINE 10 MG PO TBDP
10.0000 mg | ORAL_TABLET | Freq: Two times a day (BID) | ORAL | Status: AC | PRN
  Filled 2016-06-11: qty 1

## 2016-06-11 MED ORDER — LORAZEPAM 2 MG/ML IJ SOLN
1.0000 mg | Freq: Once | INTRAMUSCULAR | Status: DC
  Filled 2016-06-11: qty 1

## 2016-06-11 MED ORDER — RISPERIDONE 0.5 MG PO TBDP
0.5000 mg | ORAL_TABLET | Freq: Two times a day (BID) | ORAL | Status: DC
  Administered 2016-06-11 – 2016-06-12 (×4): 0.5 mg via ORAL
  Filled 2016-06-11 (×6): qty 1

## 2016-06-11 MED ORDER — OLANZAPINE 10 MG PO TBDP
10.0000 mg | ORAL_TABLET | Freq: Every day | ORAL | Status: DC
  Administered 2016-06-11 – 2016-06-12 (×2): 10 mg via ORAL
  Filled 2016-06-11 (×4): qty 1

## 2016-06-11 MED ORDER — OLANZAPINE 10 MG IM SOLR
10.0000 mg | Freq: Two times a day (BID) | INTRAMUSCULAR | Status: AC | PRN
Start: 2016-06-11 — End: 2016-06-11
  Administered 2016-06-11: 10 mg via INTRAMUSCULAR
  Filled 2016-06-11: qty 10

## 2016-06-11 NOTE — Progress Notes (Signed)
Family Medicine Teaching Service Daily Progress Note Intern Pager: [phone]  Patient name: [name] Long Medical record number: [id number] Date of birth: May 29, 1951 Age: 65 y.o. Gender: female  Primary Care Provider: Ronnie Doss, DO Consultants: Psychiatry Code Status: Full  Pt Overview and Major Events to Date:  12/8 - Admitted with paranoia 12/9 - Hypokalemia resolved. Medically stable for Psychiatry eval.   Assessment and Plan: [name] Long is a 64 y.o. female presenting with hypokalemia and paranoia. PMH is significant for Bipolar Disorder, Hypothyroidism, Type 2 Diabetes, hypertension, CKD, hyperlipidemia, COPD  # Hypokalemia: Resolved after 3 runs of IV potassium and IVFs containing K. Total of 90 mEq given. Potassium noted to be severely low to <2 in ED. Creatinine normal at 0.88. EKG with sinus tachycardia, borderline prolonged PR. Prescribed HCTZ, Miralax, Risperdal, all of which could contribute to hypokalemia. Appears to be asymptomatic at this time. Tachycardia resolved. - Admitted to [facility] Medicine Teaching Service, Dr. Ree Kida attending - Vitals per floor protocol - Magnesium, Phosphorus, TSH all WNL - Will give 20 mEq kdur today then continue to monitor potassium daily - Low threshold to repeat EKG - D/c telemetry - Hold HCTZ, Miralax, and Risperdal, all of which can contribute to Hypokalemia  # Bipolar Disorder: Manic. UDS negative. IVC initiated 12/8, to expire in 7 days. Currently prescribed Depakote and Risperidone, however reports she has not taken these medications for 2 months. Geodon given in ED. ODT zyprexa given overnight 12/9, as has lower risk of QTc prolongation. Depakote level < 10. Continues to have grandiose thoughts, paranoia and agitation. - Restarted depakote overnight 12/8 - Continue Ativan 1mg  q8hr as needed anxiety.  - Consulted Psychiatry. May require inpatient psychiatric hospitalization.  # Hypothyroidism: Last TSH 0.99 in 02/2016.  On admission was 0.466.  - Continue home synthroid 50 mcg  # Type 2 Diabetes: Last A1C 6.3 in 10/2015, same 06/10/16. Prescribed Metformin 500mg  with breakfast - Monitor CBG. If increased agitation, may consider spacing out CBGs until mental status improves - Hold home Metformin.  - Sensitive Insulin Sliding Scale  # Hypertension: Normotensive. Home medications include Norvasc 5mg  and HCTZ 25mg . Does not remember if she is taking her medication.  - Hold home Norvasc and monitor BP. Restart if needed. - Recommend holding HCTZ given presentation with hypokalemia.   # CKD: Creatinine 0.88 on admission (appears to be at baseline)  # COPD: Home medications include Albuterol, QVAR - Hold home medications given agitation and normal respiratory exam on admission. Restart when able.   # Hyperlipidemia: Last lipid panel in 02/2016 with cholesterol 156, triglycerides 128, HDL 49, and LDL 81. Prescribed Pravastatin 40mg .  - Continue home medication.   FEN/GI: Carb Modified Prophylaxis: Lovenox  Disposition: Evaluate for inpatient Psychiatric admission.   Subjective:  Patient refusing exam today. She is fixated on getting her belongings back, especially her phone, so that she can communicate with "special people." She is very particular about who is in her room and has required bedside sitter switches. Overnight required a couple doses of ativan and zyprexa.   Objective: Temp:  [98.2 F (36.8 C)-99 F (37.2 C)] 99 F (37.2 C) (12/09 0555) Pulse Rate:  [80-106] 82 (12/09 0555) Resp:  [19-20] 19 (12/09 0555) BP: (104-113)/(56-85) 112/85 (12/09 0555) SpO2:  [99 %-100 %] 99 % (12/09 0555) Physical Exam: Patient refused.   Laboratory:  Recent Labs Lab 06/10/16 0145 06/10/16 0610  WBC 8.7 6.6  HGB 13.7 12.1  HCT 38.4 34.1*  PLT 280 230  Recent Labs Lab 06/10/16 0145  06/10/16 0610 06/10/16 1430 06/10/16 2057  NA 133*  --  139  --   --   K <2.0*  < > 2.1* 3.1* 3.4*  CL  90*  --  97*  --   --   CO2 30  --  31  --   --   BUN 5*  --  <5*  --   --   CREATININE 0.88  --  0.82  --   --   CALCIUM 9.5  --  8.8*  --   --   PROT 7.2  --   --   --   --   BILITOT 0.4  --   --   --   --   ALKPHOS 81  --   --   --   --   ALT 16  --   --   --   --   AST 21  --   --   --   --   GLUCOSE 180*  --  105*  --   --   < > = values in this interval not displayed.   Rogue Bussing, MD 06/11/2016, 9:43 AM PGY-2, [place] Intern pager: [phone], text pages welcome

## 2016-06-11 NOTE — Progress Notes (Signed)
This Probation officer received a call from Ola Spurr, MD referring the above patient for a psych consult.  Please contact the attending at [phone] and all information was transferred to the consult list for psychiatrist.      Chesley Noon, MSW, [facility] Triage Specialist [phone]
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
## 2016-06-12 LAB — GLUCOSE, CAPILLARY
Glucose-Capillary: 111 mg/dL — ABNORMAL HIGH (ref 65–99)
Glucose-Capillary: 81 mg/dL (ref 65–99)
Glucose-Capillary: 140 mg/dL — ABNORMAL HIGH (ref 65–99)

## 2016-06-12 NOTE — Progress Notes (Signed)
Family Medicine Teaching Service Daily Progress Note Intern Pager: [phone]  Patient name: [name] Long Medical record number: [id number] Date of birth: Aug 24, 1950 Age: 65 y.o. Gender: female  Primary Care Provider: Ronnie Doss, DO Consultants: Psychiatry Code Status: Full  Pt Overview and Major Events to Date:  12/8 - Admitted with paranoia 12/9 - Hypokalemia resolved. Medically stable for Psychiatry eval.   Assessment and Plan: [name] Long is a 65 y.o. female presenting with hypokalemia and paranoia. PMH is significant for Bipolar Disorder, Hypothyroidism, Type 2 Diabetes, hypertension, CKD, hyperlipidemia, COPD  # Hypokalemia: Resolved  - Vitals per floor protocol - Magnesium, Phosphorus, TSH all WNL - D/c telemetry - Hold HCTZ, Miralax, and Risperdal, all of which can contribute to Hypokalemia  # Bipolar Disorder: Manic. UDS negative. IVC initiated 12/8, to expire in 7 days. Currently prescribed Depakote and Risperidone, however reports she has not taken these medications for 2 months. Geodon given in ED. ODT zyprexa given overnight 12/9, as has lower risk of QTc prolongation. Depakote level < 10. Continues to have grandiose thoughts, paranoia and agitation. - Continue Ativan 1mg  q8hr as needed anxiety.  - Consulted Psychiatry--> will require inpatient psych care likely to be transferred on 12/11 - Per psych continue Depakote DR 500 mg PO BID for mood swings, Risperdone 0.5 mg PO BID for agitation, Zyprexa 10 mg IM / PO BID /PRN   # Hypothyroidism: Last TSH 0.99 in 02/2016. On admission was 0.466.  - Continue home synthroid 50 mcg  # Type 2 Diabetes: Last A1C 6.3 in 10/2015, same 06/10/16. Takes Metformin 500mg  with breakfast - Monitor CBG. If increased agitation, may consider spacing out CBGs until mental status improves - Hold home Metformin.  - Sensitive Insulin Sliding Scale  # Hypertension: Normotensive. Home medications include Norvasc 5mg  and HCTZ 25mg .  Does not remember if she is taking her medication.  - Hold home Norvasc and monitor BP. Restart if needed. - Recommend holding HCTZ given presentation with hypokalemia.   # CKD: Creatinine 0.88 on admission (appears to be at baseline)  # COPD: Stable Home medications include Albuterol, QVAR - Hold home medications given agitation and normal respiratory exam on admission. Restart when able.   # Hyperlipidemia: Last lipid panel in 02/2016 with cholesterol 156, triglycerides 128, HDL 49, and LDL 81. Prescribed Pravastatin 40mg .  - Continue home medication.   FEN/GI: Carb Modified Prophylaxis: Lovenox  Disposition: Inpatient Psychiatric admission.   Subjective:  Patient refusing exam today. She states only specific people can examine her  Objective: Temp:  [99 F (37.2 C)-99.2 F (37.3 C)] 99.2 F (37.3 C) (12/09 0944) Pulse Rate:  [82-87] 87 (12/09 0944) Resp:  [19-20] 20 (12/09 0944) BP: (112-114)/(85-86) 114/86 (12/09 0944) SpO2:  [99 %] 99 % (12/09 0944) Physical Exam: Patient refused.   Laboratory:  Recent Labs Lab 06/10/16 0145 06/10/16 0610  WBC 8.7 6.6  HGB 13.7 12.1  HCT 38.4 34.1*  PLT 280 230    Recent Labs Lab 06/10/16 0145  06/10/16 0610 06/10/16 1430 06/10/16 2057  NA 133*  --  139  --   --   K <2.0*  < > 2.1* 3.1* 3.4*  CL 90*  --  97*  --   --   CO2 30  --  31  --   --   BUN 5*  --  <5*  --   --   CREATININE 0.88  --  0.82  --   --   CALCIUM 9.5  --  8.8*  --   --   PROT 7.2  --   --   --   --   BILITOT 0.4  --   --   --   --   ALKPHOS 81  --   --   --   --   ALT 16  --   --   --   --   AST 21  --   --   --   --   GLUCOSE 180*  --  105*  --   --   < > = values in this interval not displayed.   Veatrice Bourbon, MD 06/12/2016, 4:08 AM PGY-3, [place] Intern pager: [phone], text pages welcome

## 2016-06-13 ENCOUNTER — Encounter: Payer: Self-pay | Admitting: Psychiatry

## 2016-06-13 ENCOUNTER — Inpatient Hospital Stay
Admission: EM | Admit: 2016-06-13 | Discharge: 2016-06-24 | DRG: 885 | Disposition: A | Discharge status: Home / Self Care | Payer: Medicaid Other | Source: Intra-hospital | Attending: Psychiatry | Admitting: Psychiatry

## 2016-06-13 DIAGNOSIS — K219 Gastro-esophageal reflux disease without esophagitis: Secondary | ICD-10-CM | POA: Diagnosis present

## 2016-06-13 DIAGNOSIS — E7800 Pure hypercholesterolemia, unspecified: Secondary | ICD-10-CM | POA: Diagnosis present

## 2016-06-13 DIAGNOSIS — F332 Major depressive disorder, recurrent severe without psychotic features: Secondary | ICD-10-CM | POA: Diagnosis present

## 2016-06-13 DIAGNOSIS — N182 Chronic kidney disease, stage 2 (mild): Secondary | ICD-10-CM | POA: Diagnosis present

## 2016-06-13 DIAGNOSIS — E039 Hypothyroidism, unspecified: Secondary | ICD-10-CM | POA: Diagnosis present

## 2016-06-13 DIAGNOSIS — Z9114 Patient's other noncompliance with medication regimen: Secondary | ICD-10-CM | POA: Diagnosis not present

## 2016-06-13 DIAGNOSIS — K5900 Constipation, unspecified: Secondary | ICD-10-CM | POA: Diagnosis present

## 2016-06-13 DIAGNOSIS — G4700 Insomnia, unspecified: Secondary | ICD-10-CM | POA: Diagnosis present

## 2016-06-13 DIAGNOSIS — Z720 Tobacco use: Secondary | ICD-10-CM | POA: Diagnosis present

## 2016-06-13 DIAGNOSIS — E1122 Type 2 diabetes mellitus with diabetic chronic kidney disease: Secondary | ICD-10-CM | POA: Diagnosis present

## 2016-06-13 DIAGNOSIS — Z885 Allergy status to narcotic agent status: Secondary | ICD-10-CM

## 2016-06-13 DIAGNOSIS — Z79899 Other long term (current) drug therapy: Secondary | ICD-10-CM | POA: Diagnosis not present

## 2016-06-13 DIAGNOSIS — J449 Chronic obstructive pulmonary disease, unspecified: Secondary | ICD-10-CM | POA: Diagnosis present

## 2016-06-13 DIAGNOSIS — F17210 Nicotine dependence, cigarettes, uncomplicated: Secondary | ICD-10-CM | POA: Diagnosis present

## 2016-06-13 DIAGNOSIS — J441 Chronic obstructive pulmonary disease with (acute) exacerbation: Secondary | ICD-10-CM | POA: Diagnosis present

## 2016-06-13 DIAGNOSIS — F259 Schizoaffective disorder, unspecified: Secondary | ICD-10-CM | POA: Diagnosis present

## 2016-06-13 DIAGNOSIS — I129 Hypertensive chronic kidney disease with stage 1 through stage 4 chronic kidney disease, or unspecified chronic kidney disease: Secondary | ICD-10-CM | POA: Diagnosis present

## 2016-06-13 DIAGNOSIS — E119 Type 2 diabetes mellitus without complications: Secondary | ICD-10-CM

## 2016-06-13 DIAGNOSIS — F419 Anxiety disorder, unspecified: Secondary | ICD-10-CM | POA: Diagnosis present

## 2016-06-13 DIAGNOSIS — Z888 Allergy status to other drugs, medicaments and biological substances status: Secondary | ICD-10-CM | POA: Diagnosis not present

## 2016-06-13 DIAGNOSIS — I2510 Atherosclerotic heart disease of native coronary artery without angina pectoris: Secondary | ICD-10-CM | POA: Diagnosis present

## 2016-06-13 DIAGNOSIS — F3113 Bipolar disorder, current episode manic without psychotic features, severe: Secondary | ICD-10-CM

## 2016-06-13 DIAGNOSIS — F250 Schizoaffective disorder, bipolar type: Principal | ICD-10-CM | POA: Diagnosis present

## 2016-06-13 DIAGNOSIS — F17200 Nicotine dependence, unspecified, uncomplicated: Secondary | ICD-10-CM

## 2016-06-13 LAB — GLUCOSE, CAPILLARY
Glucose-Capillary: 125 mg/dL — ABNORMAL HIGH (ref 65–99)
Glucose-Capillary: 86 mg/dL (ref 65–99)
Glucose-Capillary: 75 mg/dL (ref 65–99)
Glucose-Capillary: 95 mg/dL (ref 65–99)
Glucose-Capillary: 117 mg/dL — ABNORMAL HIGH (ref 65–99)

## 2016-06-13 MED ORDER — OLANZAPINE 10 MG PO TBDP: 10.0000 mg | ORAL_TABLET | Freq: Every day | ORAL | 0 refills | Status: DC

## 2016-06-13 MED ORDER — PANTOPRAZOLE SODIUM 40 MG PO TBEC
40.0000 mg | DELAYED_RELEASE_TABLET | Freq: Every day | ORAL | Status: DC
  Administered 2016-06-14 – 2016-06-24 (×11): 40 mg via ORAL
  Filled 2016-06-13 (×11): qty 1

## 2016-06-13 MED ORDER — ACETAMINOPHEN 325 MG PO TABS
650.0000 mg | ORAL_TABLET | Freq: Four times a day (QID) | ORAL | Status: DC | PRN
  Administered 2016-06-14 – 2016-06-24 (×7): 650 mg via ORAL
  Filled 2016-06-13 (×7): qty 2

## 2016-06-13 MED ORDER — LEVOTHYROXINE SODIUM 50 MCG PO TABS
50.0000 ug | ORAL_TABLET | Freq: Every day | ORAL | Status: DC
  Administered 2016-06-14 – 2016-06-24 (×12): 50 ug via ORAL
  Filled 2016-06-13 (×13): qty 1

## 2016-06-13 MED ORDER — RISPERIDONE 0.5 MG PO TBDP: 0.5000 mg | ORAL_TABLET | Freq: Two times a day (BID) | ORAL | 0 refills | Status: DC

## 2016-06-13 MED ORDER — NITROGLYCERIN 0.4 MG SL SUBL: 0.4000 mg | SUBLINGUAL_TABLET | SUBLINGUAL | Status: DC | PRN

## 2016-06-13 MED ORDER — POLYETHYLENE GLYCOL 3350 17 G PO PACK
17.0000 g | PACK | Freq: Every day | ORAL | Status: DC
  Administered 2016-06-14 – 2016-06-23 (×3): 17 g via ORAL
  Filled 2016-06-13 (×9): qty 1

## 2016-06-13 MED ORDER — DIVALPROEX SODIUM 500 MG PO DR TAB
500.0000 mg | DELAYED_RELEASE_TABLET | Freq: Three times a day (TID) | ORAL | Status: DC
  Administered 2016-06-14 – 2016-06-20 (×17): 500 mg via ORAL
  Filled 2016-06-13 (×22): qty 1

## 2016-06-13 MED ORDER — HYDROXYZINE HCL 25 MG PO TABS
25.0000 mg | ORAL_TABLET | Freq: Three times a day (TID) | ORAL | Status: DC | PRN
  Filled 2016-06-13 (×4): qty 1

## 2016-06-13 MED ORDER — ALUM & MAG HYDROXIDE-SIMETH 200-200-20 MG/5ML PO SUSP: 30.0000 mL | ORAL | Status: DC | PRN

## 2016-06-13 MED ORDER — DOCUSATE SODIUM 100 MG PO CAPS
100.0000 mg | ORAL_CAPSULE | Freq: Two times a day (BID) | ORAL | Status: DC
  Administered 2016-06-23 (×3): 100 mg via ORAL
  Filled 2016-06-13 (×11): qty 1

## 2016-06-13 MED ORDER — OLANZAPINE 5 MG PO TBDP: 10.0000 mg | ORAL_TABLET | Freq: Every day | ORAL | Status: DC

## 2016-06-13 MED ORDER — ALBUTEROL SULFATE HFA 108 (90 BASE) MCG/ACT IN AERS
1.0000 | INHALATION_SPRAY | Freq: Four times a day (QID) | RESPIRATORY_TRACT | Status: DC | PRN
  Administered 2016-06-14 – 2016-06-17 (×5): 2 via RESPIRATORY_TRACT
  Filled 2016-06-13: qty 6.7

## 2016-06-13 MED ORDER — NICOTINE 21 MG/24HR TD PT24
21.0000 mg | MEDICATED_PATCH | Freq: Every day | TRANSDERMAL | Status: DC
  Administered 2016-06-18: 21 mg via TRANSDERMAL
  Filled 2016-06-13 (×3): qty 1

## 2016-06-13 MED ORDER — AMLODIPINE BESYLATE 5 MG PO TABS
5.0000 mg | ORAL_TABLET | Freq: Every day | ORAL | Status: DC
  Administered 2016-06-14 – 2016-06-24 (×10): 5 mg via ORAL
  Filled 2016-06-13 (×11): qty 1

## 2016-06-13 MED ORDER — DIVALPROEX SODIUM ER 500 MG PO TB24: 500.0000 mg | ORAL_TABLET | Freq: Two times a day (BID) | ORAL | 0 refills | Status: DC

## 2016-06-13 MED ORDER — TRAZODONE HCL 100 MG PO TABS
100.0000 mg | ORAL_TABLET | Freq: Every day | ORAL | Status: DC
  Filled 2016-06-13 (×2): qty 1

## 2016-06-13 MED ORDER — MAGNESIUM HYDROXIDE 400 MG/5ML PO SUSP: 30.0000 mL | Freq: Every day | ORAL | Status: DC | PRN

## 2016-06-13 NOTE — Tx Team (Signed)
Initial Treatment Plan 06/13/2016 9:09 PM [name] Long [id number]    PATIENT STRESSORS: Medication change or noncompliance   PATIENT STRENGTHS: General fund of knowledge Supportive family/friends   PATIENT IDENTIFIED PROBLEMS: Delusional thinking  "I'm a doctor....mainly a nurse."  Paranoia                  DISCHARGE CRITERIA:  Improved stabilization in mood, thinking, and/or behavior  PRELIMINARY DISCHARGE PLAN: Outpatient therapy  PATIENT/FAMILY INVOLVEMENT: This treatment plan has been presented to and reviewed with the patient, [name] Long, and/or family member.  The patient and family have been given the opportunity to ask questions and make suggestions.  Derrill Kay, RN 06/13/2016, 9:09 PM

## 2016-06-13 NOTE — Progress Notes (Signed)
Called and gave report to nurse at [facility]. Will continue to monitor patient.

## 2016-06-13 NOTE — Progress Notes (Signed)
[name] to be D/C'd [place] per MD order.      Medication List    STOP taking these medications   hydrochlorothiazide 25 MG tablet Commonly known as:  HYDRODIURIL   meloxicam 15 MG tablet Commonly known as:  MOBIC   metFORMIN 500 MG tablet Commonly known as:  GLUCOPHAGE   metroNIDAZOLE 500 MG tablet Commonly known as:  FLAGYL   naproxen 500 MG tablet Commonly known as:  NAPROSYN   potassium chloride 10 MEQ tablet Commonly known as:  K-DUR   predniSONE 20 MG tablet Commonly known as:  DELTASONE   risperiDONE 2 MG tablet Commonly known as:  RISPERDAL Replaced by:  risperiDONE 0.5 MG disintegrating tablet     TAKE these medications   accu-chek multiclix lancets TEST BLOOD SUGAR 1 TO 3 TIMES PER WEEK   albuterol 108 (90 Base) MCG/ACT inhaler Commonly known as:  PROVENTIL HFA Inhale 2 puffs into the lungs every 4 (four) hours as needed. For wheezing   amLODipine 5 MG tablet Commonly known as:  NORVASC Take 1 tablet (5 mg total) by mouth daily.   BAYER [employer]IL[employer] ASPIRIN 81 MG chewable tablet Generic drug:  aspirin Chew 81 mg by mouth daily.   beclomethasone 80 MCG/ACT inhaler Commonly known as:  QVAR Inhale 1 puff into the lungs 2 (two) times daily.   divalproex 500 MG 24 hr tablet Commonly known as:  DEPAKOTE ER Take 1 tablet (500 mg total) by mouth 2 (two) times daily. What changed:  medication strength  how much to take  how to take this  when to take this  additional instructions   docusate sodium 100 MG capsule Commonly known as:  COLACE Take 1 capsule (100 mg total) by mouth every 12 (twelve) hours.   fluticasone 50 MCG/ACT nasal spray Commonly known as:  FLONASE Place 2 sprays into both nostrils daily.   glucose blood test strip Commonly known as:  ACCU-CHEK AVIVA PLUS Use as instructed   ibuprofen 800 MG tablet Commonly known as:  ADVIL,MOTRIN Take 1 tablet (800 mg total) by mouth 3 (three) times daily.   levothyroxine 50  MCG tablet Commonly known as:  SYNTHROID Take 1 tablet (50 mcg total) by mouth daily before breakfast.   nitroGLYCERIN 0.4 MG SL tablet Commonly known as:  NITROSTAT Place 1 tablet (0.4 mg total) under the tongue every 5 (five) minutes as needed for chest pain.   OLANZapine zydis 10 MG disintegrating tablet Commonly known as:  ZYPREXA Take 1 tablet (10 mg total) by mouth at bedtime.   olopatadine 0.1 % ophthalmic solution Commonly known as:  PATANOL Place 1 drop into both eyes 2 (two) times daily.   omeprazole 40 MG capsule Commonly known as:  PRILOSEC Take 1 capsule (40 mg total) by mouth daily.   polyethylene glycol packet Commonly known as:  MIRALAX Take 17 g by mouth daily.   pravastatin 40 MG tablet Commonly known as:  PRAVACHOL take 1 tablet by mouth every evening What changed:  how much to take  how to take this  when to take this  additional instructions   risperiDONE 0.5 MG disintegrating tablet Commonly known as:  RISPERDAL M-TABS Take 1 tablet (0.5 mg total) by mouth 2 (two) times daily. Replaces:  risperiDONE 2 MG tablet       Vitals:   06/13/16 0531 06/13/16 0929  BP: 133/81 124/63  Pulse: 67 91  Resp: 20 18  Temp: 98 F (36.7 C) 97.8 F (36.6 C)  Skin clean, dry and intact without evidence of skin break down, no evidence of skin tears noted. Pt denies pain at this time. No complaints noted.   Patient escorted via [place]., and D/C to [facility] via transportation by the [facility]. Dixie Dials RN, BSN

## 2016-06-13 NOTE — Discharge Summary (Signed)
[facility] Discharge Summary  Patient name: [name] Long Medical record number: [id number] Date of birth: 1951-03-01 Age: 65 y.o. Gender: female Date of Admission: 06/10/2016  Date of Discharge: 06/13/16  Admitting Physician: Leeanne Rio, MD  Primary Care Provider: Ronnie Doss, DO Consultants: Psychiatry  Indication for Hospitalization: Hyperkalemia, paranoid  Discharge Diagnoses/Problem List:  Principal Problem:   Affective psychosis, bipolar ([place]) Active Problems:   Hypokalemia   Psychosis    Disposition: Discharged to inpatient psych  Discharge Condition: stable, improved  Discharge Exam:  General: 65yo female sitting in chair appearing comfortable.  Eyes: EOMI ENTM: MMM Neck: Supple, no lymphadenopathy Cardiovascular: RRR, no murmurs Respiratory: normal WOB, CTABL Gastrointestinal: Soft, NTND Neuro: CN II-XII intact, sensation intact Psych: AAOx3  Brief Hospital Course:  65 year old female who presented with manic symptoms, paranoid in the setting of being out of her psychiatric medications for several months. She was seen in the ED for medical clearance, and was noted to have potassium less than 2 and she was admitted for medical stabilization and psych consultation.  Her hypokalemia was felt to be due to medications and she was on several medications that are known to cause hypokalemia.  She was initially repleted with IV potassium and then PO potassium.  Patient was placed under IVC, after being brought in by [facility] department due to displaying threatening vera towards her granddaughter.  She was given Geodon in the ED and her QTC was slightly prolonged at 478 and we avoided medications that would further prolong her QTC during admission.  She was seen by psychiatry who increased her Depakote DR 500 mg twice a day for mood swings, started Risperdal 0.5 mg twice a day for agitation and also started Zyprexa 10 mg IM/PO  BID/PRN for bipolar agitation.  Patient's other chronic medical problems remained stable during admission.  At the time of discharge she reported being in a good mood and was medically stable for discharge to inpatient psychiatry at [facility] and accepted by Dr. Bary Leriche.   Issues for Follow Up:  1. Bipolar disorder: Currently on Zyprexa, risperidone and Depakote and appears stable.  2. Hypokalemia: Patient admitted with potassium of <2 and was improved to 3.4 on 06/11/16.  Appropriate to recheck.   Significant Procedures: None  Significant Labs and Imaging:   Recent Labs Lab 06/10/16 0145 06/10/16 0610  WBC 8.7 6.6  HGB 13.7 12.1  HCT 38.4 34.1*  PLT 280 230    Recent Labs Lab 06/10/16 0145  06/10/16 0457 06/10/16 0610 06/10/16 1430 06/10/16 2057  NA 133*  --   --  139  --   --   K <2.0*  < >  --  2.1* 3.1* 3.4*  CL 90*  --   --  97*  --   --   CO2 30  --   --  31  --   --   GLUCOSE 180*  --   --  105*  --   --   BUN 5*  --   --  <5*  --   --   CREATININE 0.88  --   --  0.82  --   --   CALCIUM 9.5  --   --  8.8*  --   --   MG  --   --  1.8  --   --   --   PHOS  --   --  3.7  --   --   --  ALKPHOS 81  --   --   --   --   --   AST 21  --   --   --   --   --   ALT 16  --   --   --   --   --   ALBUMIN 3.6  --   --   --   --   --   < > = values in this interval not displayed.  Results/Tests Pending at Time of Discharge: None  Discharge Medications:    Medication List    STOP taking these medications   hydrochlorothiazide 25 MG tablet Commonly known as:  HYDRODIURIL   meloxicam 15 MG tablet Commonly known as:  MOBIC   metFORMIN 500 MG tablet Commonly known as:  GLUCOPHAGE   metroNIDAZOLE 500 MG tablet Commonly known as:  FLAGYL   naproxen 500 MG tablet Commonly known as:  NAPROSYN   potassium chloride 10 MEQ tablet Commonly known as:  K-DUR   predniSONE 20 MG tablet Commonly known as:  DELTASONE   risperiDONE 2 MG tablet Commonly known as:   RISPERDAL Replaced by:  risperiDONE 0.5 MG disintegrating tablet     TAKE these medications   accu-chek multiclix lancets TEST BLOOD SUGAR 1 TO 3 TIMES PER WEEK   albuterol 108 (90 Base) MCG/ACT inhaler Commonly known as:  PROVENTIL HFA Inhale 2 puffs into the lungs every 4 (four) hours as needed. For wheezing   amLODipine 5 MG tablet Commonly known as:  NORVASC Take 1 tablet (5 mg total) by mouth daily.   BAY[employer]ILDR[employer] ASPIRIN 81 MG chewable tablet Generic drug:  aspirin Chew 81 mg by mouth daily.   beclomethasone 80 MCG/ACT inhaler Commonly known as:  QVAR Inhale 1 puff into the lungs 2 (two) times daily.   divalproex 500 MG 24 hr tablet Commonly known as:  DEPAKOTE ER Take 1 tablet (500 mg total) by mouth 2 (two) times daily. What changed:  medication strength  how much to take  how to take this  when to take this  additional instructions   docusate sodium 100 MG capsule Commonly known as:  COLACE Take 1 capsule (100 mg total) by mouth every 12 (twelve) hours.   fluticasone 50 MCG/ACT nasal spray Commonly known as:  FLONASE Place 2 sprays into both nostrils daily.   glucose blood test strip Commonly known as:  ACCU-CHEK AVIVA PLUS Use as instructed   ibuprofen 800 MG tablet Commonly known as:  ADVIL,MOTRIN Take 1 tablet (800 mg total) by mouth 3 (three) times daily.   levothyroxine 50 MCG tablet Commonly known as:  SYNTHROID Take 1 tablet (50 mcg total) by mouth daily before breakfast.   nitroGLYCERIN 0.4 MG SL tablet Commonly known as:  NITROSTAT Place 1 tablet (0.4 mg total) under the tongue every 5 (five) minutes as needed for chest pain.   OLANZapine zydis 10 MG disintegrating tablet Commonly known as:  ZYPREXA Take 1 tablet (10 mg total) by mouth at bedtime.   olopatadine 0.1 % ophthalmic solution Commonly known as:  PATANOL Place 1 drop into both eyes 2 (two) times daily.   omeprazole 40 MG capsule Commonly known as:  PRILOSEC Take 1  capsule (40 mg total) by mouth daily.   polyethylene glycol packet Commonly known as:  MIRALAX Take 17 g by mouth daily.   pravastatin 40 MG tablet Commonly known as:  PRAVACHOL take 1 tablet by mouth every evening What changed:  how much to  take  how to take this  when to take this  additional instructions   risperiDONE 0.5 MG disintegrating tablet Commonly known as:  RISPERDAL M-TABS Take 1 tablet (0.5 mg total) by mouth 2 (two) times daily. Replaces:  risperiDONE 2 MG tablet       Discharge Instructions: Please refer to Patient Instructions section of EMR for full details.  Patient was counseled important signs and symptoms that should prompt return to medical care, changes in medications, dietary instructions, activity restrictions, and follow up appointments.   Follow-Up Appointments:   Eloise Levels, MD 06/13/2016, 2:21 PM PGY-1, [place]

## 2016-06-13 NOTE — Clinical Social Work Note (Signed)
Patient discharge to [facility] this evening, transported by [facility].  They were provided with commitment paperwork and this paperwork also transmitted to facility. Patient's daughter [name] Long was advised of discharge earlier today while at the hospital visiting her mother.  Crawford Givens, MSW, LCSW Licensed Clinical Social Worker [place] [phone]

## 2016-06-13 NOTE — Progress Notes (Signed)
Patient refused medications and shift assessment. Pt. Educated. Will continue to monitor.

## 2016-06-13 NOTE — Progress Notes (Signed)
Patient is to be admitted to [place] [facility] by Dr. Bary Leriche.  Attending Physician will be Dr. Bary Leriche.   Patient has been assigned to room 302, by [place] Nurse Marcie Bal.   ER staff is aware of the admission ( ER Sect.; ER MD; Patient's Nurse &  Patient Access). Call to report [phone] Shean K. Nash Shearer, LPC-A, [facility]  Counselor 06/13/2016 11:25 AM

## 2016-06-13 NOTE — Progress Notes (Signed)
Patient ID: DEAH OTSUKA, female   DOB: Feb 05, 1951, 65 y.o.   MRN: [id number] Patient admitted from ED after exhibiting of paranoia and delusional thinking. Patient is very disorganized in her thought. Denies SI/HI. States God talk so her. Very paranoid about the unit and suspicious. When asked if she wanted something to eat she stated, "yeah, unless you're going to poison me." Delusional speech. Kept referring to a Clair Gulling who was the "ring leader of the gays." And believes that her family has a grave site on the side of their house so they can "preserve her." Skin search done with [facility]. No contraband found. Patient states she in "remission" of her diabetes. Nutrition provided. Safety maintained with 15 min checks.

## 2016-06-14 DIAGNOSIS — F250 Schizoaffective disorder, bipolar type: Principal | ICD-10-CM

## 2016-06-14 MED ORDER — OLANZAPINE 5 MG PO TBDP
15.0000 mg | ORAL_TABLET | Freq: Every day | ORAL | Status: DC
  Filled 2016-06-14 (×2): qty 3

## 2016-06-14 NOTE — Plan of Care (Signed)
Problem: Safety: Goal: Ability to remain free from injury will improve Outcome: Progressing Patient has remained free of injury during this shift.
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
## 2016-06-14 NOTE — Tx Team (Signed)
Interdisciplinary Treatment and Diagnostic Plan Update  06/14/2016 Time of Session: 10:30am [name] Long MRN: [id number]  Principal Diagnosis: Schizoaffective disorder, bipolar type ([place])  Secondary Diagnoses: Principal Problem:   Schizoaffective disorder, bipolar type ([place]) Active Problems:   Hypothyroidism   Type 2 diabetes, diet controlled ([place])   Tobacco use disorder   COPD (chronic obstructive pulmonary disease) ([place])   Major depressive disorder, recurrent severe without psychotic features ([place])   Current Medications:  Current Facility-Administered Medications  Medication Dose Route Frequency Provider Last Rate Last Dose  . acetaminophen (TYLENOL) tablet 650 mg  650 mg Oral Q6H PRN Clovis Fredrickson, MD   650 mg at 06/14/16 0555  . albuterol (PROVENTIL HFA;VENTOLIN HFA) 108 (90 Base) MCG/ACT inhaler 1-2 puff  1-2 puff Inhalation Q6H PRN Jolanta B Pucilowska, MD      . alum & mag hydroxide-simeth (MAALOX/MYLANTA) 200-200-20 MG/5ML suspension 30 mL  30 mL Oral Q4H PRN Jolanta B Pucilowska, MD      . amLODipine (NORVASC) tablet 5 mg  5 mg Oral Daily Jolanta B Pucilowska, MD   5 mg at 06/14/16 0845  . divalproex (DEPAKOTE) DR tablet 500 mg  500 mg Oral Q8H Jolanta B Pucilowska, MD   500 mg at 06/14/16 1504  . docusate sodium (COLACE) capsule 100 mg  100 mg Oral BID Jolanta B Pucilowska, MD      . hydrOXYzine (ATARAX/VISTARIL) tablet 25 mg  25 mg Oral TID PRN Jolanta B Pucilowska, MD      . levothyroxine (SYNTHROID, LEVOTHROID) tablet 50 mcg  50 mcg Oral QAC breakfast Clovis Fredrickson, MD   50 mcg at 06/14/16 0554  . magnesium hydroxide (MILK OF MAGNESIA) suspension 30 mL  30 mL Oral Daily PRN Jolanta B Pucilowska, MD      . nicotine (NICODERM CQ - dosed in mg/24 hours) patch 21 mg  21 mg Transdermal Q0600 Jolanta B Pucilowska, MD      . nitroGLYCERIN (NITROSTAT) SL tablet 0.4 mg  0.4 mg Sublingual Q5 min PRN Jolanta B Pucilowska, MD      . OLANZapine zydis (ZYPREXA)  disintegrating tablet 15 mg  15 mg Oral QHS Jolanta B Pucilowska, MD      . pantoprazole (PROTONIX) EC tablet 40 mg  40 mg Oral Daily Jolanta B Pucilowska, MD   40 mg at 06/14/16 0845  . polyethylene glycol (MIRALAX / GLYCOLAX) packet 17 g  17 g Oral Daily Jolanta B Pucilowska, MD   17 g at 06/14/16 0848  . traZODone (DESYREL) tablet 100 mg  100 mg Oral QHS Jolanta B Pucilowska, MD       PTA Medications: Prescriptions Prior to Admission  Medication Sig Dispense Refill Last Dose  . albuterol (PROVENTIL HFA) 108 (90 Base) MCG/ACT inhaler Inhale 2 puffs into the lungs every 4 (four) hours as needed. For wheezing 1 Inhaler 5 Unknown at Unknown  . amLODipine (NORVASC) 5 MG tablet Take 1 tablet (5 mg total) by mouth daily. 90 tablet 3 On file  . aspirin ([employer] ASPIRIN) 81 MG chewable tablet Chew 81 mg by mouth daily.     Unknown at Unknown  . beclomethasone (QVAR) 80 MCG/ACT inhaler Inhale 1 puff into the lungs 2 (two) times daily. 8.7 g 12 Unknown at Unknown  . divalproex (DEPAKOTE ER) 500 MG 24 hr tablet Take 1 tablet (500 mg total) by mouth 2 (two) times daily. 60 tablet 0   . docusate sodium (COLACE) 100 MG capsule Take 1 capsule (100 mg  total) by mouth every 12 (twelve) hours. 60 capsule 0 Unknown at Unknown  . fluticasone (FLONASE) 50 MCG/ACT nasal spray Place 2 sprays into both nostrils daily. (Patient not taking: Reported on 06/10/2016) 16 g 1 Completed Course at Unknown time  . glucose blood (ACCU-CHEK AVIVA PLUS) test strip Use as instructed (Patient not taking: Reported on 06/10/2016) 25 each 12 Not Taking at Unknown time  . ibuprofen (ADVIL,MOTRIN) 800 MG tablet Take 1 tablet (800 mg total) by mouth 3 (three) times daily. 21 tablet 0 Unknown at Unknown  . Lancets (ACCU-CHEK MULTICLIX) lancets TEST BLOOD SUGAR 1 TO 3 TIMES PER WEEK (Patient not taking: Reported on 06/10/2016) 102 each 3 Not Taking at Unknown time  . levothyroxine (SYNTHROID) 50 MCG tablet Take 1 tablet (50 mcg total) by  mouth daily before breakfast. 90 tablet 3 Unknown at Unknown  . nitroGLYCERIN (NITROSTAT) 0.4 MG SL tablet Place 1 tablet (0.4 mg total) under the tongue every 5 (five) minutes as needed for chest pain. 50 tablet 3 Unknown at Unknown  . OLANZapine zydis (ZYPREXA) 10 MG disintegrating tablet Take 1 tablet (10 mg total) by mouth at bedtime. 30 tablet 0   . olopatadine (PATANOL) 0.1 % ophthalmic solution Place 1 drop into both eyes 2 (two) times daily. 5 mL 2 Unknown at Unknown  . omeprazole (PRILOSEC) 40 MG capsule Take 1 capsule (40 mg total) by mouth daily. 30 capsule 11 Unknown at Unknown  . polyethylene glycol (MIRALAX) packet Take 17 g by mouth daily. 14 each 0 Unknown at Unknown  . pravastatin (PRAVACHOL) 40 MG tablet take 1 tablet by mouth every evening (Patient taking differently: Take 40 mg by mouth every evening. ) 30 tablet 11 Unknown at Unknown  . risperiDONE (RISPERDAL M-TABS) 0.5 MG disintegrating tablet Take 1 tablet (0.5 mg total) by mouth 2 (two) times daily. 60 tablet 0     Patient Stressors: Medication change or noncompliance  Patient Strengths: General fund of knowledge Supportive family/friends  Treatment Modalities: Medication Management, Group therapy, Case management,  1 to 1 session with clinician, Psychoeducation, Recreational therapy.   Physician Treatment Plan for Primary Diagnosis: Schizoaffective disorder, bipolar type ([place]) Long Term Goal(s): Improvement in symptoms so as ready for discharge NA   Short Term Goals: Ability to identify changes in lifestyle to reduce recurrence of condition will improve Ability to verbalize feelings will improve Ability to disclose and discuss suicidal ideas Ability to demonstrate self-control will improve Ability to identify and develop effective coping behaviors will improve Ability to maintain clinical measurements within normal limits will improve Compliance with prescribed medications will improve NA  Medication  Management: Evaluate patient's response, side effects, and tolerance of medication regimen.  Therapeutic Interventions: 1 to 1 sessions, Unit Group sessions and Medication administration.  Evaluation of Outcomes: Progressing  Physician Treatment Plan for Secondary Diagnosis: Principal Problem:   Schizoaffective disorder, bipolar type ([place]) Active Problems:   Hypothyroidism   Type 2 diabetes, diet controlled ([place])   Tobacco use disorder   COPD (chronic obstructive pulmonary disease) ([facility])   Major depressive disorder, recurrent severe without psychotic features ([place])  Long Term Goal(s): Improvement in symptoms so as ready for discharge NA   Short Term Goals: Ability to identify changes in lifestyle to reduce recurrence of condition will improve Ability to verbalize feelings will improve Ability to disclose and discuss suicidal ideas Ability to demonstrate self-control will improve Ability to identify and develop effective coping behaviors will improve Ability to maintain clinical measurements within normal  limits will improve Compliance with prescribed medications will improve NA     Medication Management: Evaluate patient's response, side effects, and tolerance of medication regimen.  Therapeutic Interventions: 1 to 1 sessions, Unit Group sessions and Medication administration.  Evaluation of Outcomes: Progressing   RN Treatment Plan for Primary Diagnosis: Schizoaffective disorder, bipolar type ([place]) Long Term Goal(s): Knowledge of disease and therapeutic regimen to maintain health will improve  Short Term Goals: Ability to verbalize frustration and anger appropriately will improve, Ability to demonstrate self-control, Ability to participate in decision making will improve, Ability to identify and develop effective coping behaviors will improve and Compliance with prescribed medications will improve  Medication Management: RN will administer medications as ordered by provider,  will assess and evaluate patient's response and provide education to patient for prescribed medication. RN will report any adverse and/or side effects to prescribing provider.  Therapeutic Interventions: 1 on 1 counseling sessions, Psychoeducation, Medication administration, Evaluate responses to treatment, Monitor vital signs and CBGs as ordered, Perform/monitor CIWA, COWS, AIMS and Fall Risk screenings as ordered, Perform wound care treatments as ordered.  Evaluation of Outcomes: Progressing   LCSW Treatment Plan for Primary Diagnosis: Schizoaffective disorder, bipolar type ([place]) Long Term Goal(s): Safe transition to appropriate next level of care at discharge, Engage patient in therapeutic group addressing interpersonal concerns.  Short Term Goals: Engage patient in aftercare planning with referrals and resources, Increase social support, Increase ability to appropriately verbalize feelings and Increase skills for wellness and recovery  Therapeutic Interventions: Assess for all discharge needs, 1 to 1 time with Social worker, Explore available resources and support systems, Assess for adequacy in community support network, Educate family and significant other(s) on suicide prevention, Complete Psychosocial Assessment, Interpersonal group therapy.  Evaluation of Outcomes: Progressing   Progress in Treatment: Attending groups: Yes. Participating in groups: Yes. Taking medication as prescribed: No  Toleration medication: Yes. Family/Significant other contact made: No, will contact:    Patient understands diagnosis: No Discussing patient identified problems/goals with staff: No. Medical problems stabilized or resolved: Yes. Denies suicidal/homicidal ideation: Yes. Issues/concerns per patient self-inventory: No. Other:    New problem(s) identified: No, Describe:     New Short Term/Long Term Goal(s):  Discharge Plan or Barriers:   Reason for Continuation of Hospitalization: Delusions   Medication stabilization Other; describe irritability  Estimated Length of Stay: 7 days  Attendees: Patient:[name] Long 06/14/2016 5:33 PM  Physician: Herma Ard Pucilowska 06/14/2016 5:33 PM  Nursing: Polly Cobia, RN 06/14/2016 5:33 PM  RN Care Manager: 06/14/2016 5:33 PM  Social Worker: Dossie Arbour, [place] 06/14/2016 5:33 PM  Recreational Therapist: Lemont Fillers 06/14/2016 5:33 PM  Other:  06/14/2016 5:33 PM  Other:  06/14/2016 5:33 PM  Other: 06/14/2016 5:33 PM    Scribe for Treatment Team: August Saucer, LCSW 06/14/2016 5:33 PM

## 2016-06-14 NOTE — Progress Notes (Signed)
Recreation Therapy Notes  Date: 12.12.17 Time: 9:30 am Location: Craft Room  Group Topic: Goal Setting  Goal Area(s) Addresses:  Patient will write at least one goal. Patient will write at least one obstacle.  Behavioral Response: Did not attend  Intervention: Recovery Goal Chart  Activity: Patients were instructed to create a recovery goal chart including goals, obstacles, the date they started working on their goals, and the date they achieved their goals.  Education: LRT educated patients on ways to celebrate reaching their goals in healthy ways.  Education Outcome: Patient did not attend group.   Clinical Observations/Feedback: Patient did not attend group.  Leonette [facility], LRT/CTRS 06/14/2016 10:00 AM

## 2016-06-14 NOTE — Progress Notes (Signed)
D: Patient still labile and irritable at times. Denies SI/HI/AVH at this time. Refusing HS medications except Depakote. Patient has been visible in the milieu. Laughs inappropriate when asked questions. Verbally aggressive.  A: Medication given with education. Encouragement provided.  R: Patient compliant with Depakote but not Zyprexa or Trazodone. Safety maintained with 15 min checks.

## 2016-06-14 NOTE — Progress Notes (Signed)
D: Patient stated slept good last night .Stated appetite is good and energy level  Is normal. Stated concentration is good . Stated on Depression scale , hopeless and anxiety .( low 0-10 high) Denies suicidal  homicidal ideations  .  No auditory hallucinations  No pain concerns . Appropriate ADL'S. Interacting with peers and staff.  Confused . Noted grandiosity with her Delusions"  I'm a doctor  How can I help you" Voice of her daughter being a  prostitute . Continue to voice of wanting to go home throughout shift . Belongings packed at bedside Sat on bed facing nursing stations A: Encourage patient participation with unit programming . Instruction  Given on  Medication , verbalize understanding. R: Voice no other concerns. Staff continue to monitor

## 2016-06-15 MED ORDER — OLANZAPINE 7.5 MG PO TABS
15.0000 mg | ORAL_TABLET | Freq: Once | ORAL | Status: AC
  Administered 2016-06-15: 15 mg via ORAL
  Filled 2016-06-15: qty 2

## 2016-06-15 NOTE — Plan of Care (Signed)
Problem: Coping: Goal: Ability to cope will improve Outcome: Not Progressing Pt not able to cope due to not complient with medications and paranoia refusing meds so does not have coping mechanisms [place] RN

## 2016-06-15 NOTE — Plan of Care (Signed)
Problem: Coping: Goal: Ability to verbalize feelings will improve Outcome: Progressing Improvement noted with pt. No verbal aggression observed today. Pt is cooperative and redirectable. Denies SI/HI/AVH. Anticipates discharge.

## 2016-06-15 NOTE — BHH Group Notes (Signed)
[place] LCSW Group Therapy  06/15/2016 1:25 PM  Type of Therapy:  Group Therapy  Participation Level:  Patient did not attend group. CSW invited patient to group.   Summary of Progress/Problems:Emotional Regulation: Patients will identify both negative and positive emotions. They will discuss emotions they have difficulty regulating and how they impact their lives. Patients will be asked to identify healthy coping skills to combat unhealthy reactions to negative emotions.    Amaris G. [place], [place] 06/15/2016, 1:26 PM

## 2016-06-15 NOTE — BHH Counselor (Signed)
Adult Comprehensive Assessment  Patient ID: [name] Long, female   DOB: [birth year], 65 y.o.   MRN: [id number]  Information Source: Information source: Patient  Current Stressors:  Educational / Learning stressors: n/a Employment / Job issues: Pt is disability. Family Relationships: n/a Museum/gallery curator / Lack of resources (include bankruptcy): n/a Housing / Lack of housing: Pt is living alone in section 8 housing  Physical health (include injuries & life threatening diseases): n/a Social relationships: n/a Substance abuse: Patient denies Bereavement / Loss: n/a  Living/Environment/Situation:  Living Arrangements: Parent Living conditions (as described by patient or guardian): n/a  Family History:  Marital status: Divorced Divorced, when?: Since 1991 What types of issues is patient dealing with in the relationship?: Pt did not answer Additional relationship information: n/a Are you sexually active?: No What is your sexual orientation?: Pt did not answer Has your sexual activity been affected by drugs, alcohol, medication, or emotional stress?: n/a Does patient have children?: Yes How many children?: 4 How is patient's relationship with their children?: 3 daughters and 1 son. Patient reports their relationship is good.  Childhood History:  By whom was/is the patient raised?: Both parents Additional childhood history information: n/a Description of patient's relationship with caregiver when they were a child: Raised mostly by her father.  Patient's description of current relationship with people who raised him/her: Both deceased How were you disciplined when you got in trouble as a child/adolescent?: n/a Does patient have siblings?: Yes Number of Siblings: 4 Description of patient's current relationship with siblings: 1 brother and 3 sisters. Patient reports their relationship has become better.  Did patient suffer any verbal/emotional/physical/sexual abuse as a child?: No Did  patient suffer from severe childhood neglect?: No Has patient ever been sexually abused/assaulted/raped as an adolescent or adult?: No Was the patient ever a victim of a crime or a disaster?: No Witnessed domestic violence?: No Has patient been effected by domestic violence as an adult?: No  Education:  Highest grade of school patient has completed: Some college Currently a Ship broker?: No Learning disability?: No  Employment/Work Situation:   Employment situation: On disability Why is patient on disability: Pt is unsure How long has patient been on disability: Since 1992 Patient's job has been impacted by current illness: No What is the longest time patient has a held a job?: Pt was unsure 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 Has patient ever been in the [employer]?: No Has patient ever served in combat?: No Did You Receive Any Psychiatric Treatment/Services While in Passenger transport manager?: No Are There Guns or Chiropractor in [place]?: No Are These Psychologist, educational?:  (n/a)  Financial Resources:   Museum/gallery curator resources: [employer], Medicaid, Support from children Does patient have a Programmer, applications or guardian?: No  Alcohol/Substance Abuse:   What has been your use of drugs/alcohol within the last 12 months?: Patient denies If attempted suicide, did drugs/alcohol play a role in this?: No Alcohol/Substance Abuse Treatment Hx: Denies past history Has alcohol/substance abuse ever caused legal problems?: No  Social Support System:   Patient's Community Support System: Good Describe Community Support System: Pt has support from her family/children. Type of faith/religion: n/a How does patient's faith help to cope with current illness?: n/a  Leisure/Recreation:   Leisure and Hobbies: playing pool, watching tv, bowling  Strengths/Needs:   What things does the patient do well?: cooking, good sense of humor In what areas does patient struggle /  problems for patient: depression  Discharge  Plan:   Does patient have access to transportation?: Yes (Either son or daughter) Will patient be returning to same living situation after discharge?: Yes Currently receiving community mental health services: Yes (From Whom) [facility]) Does patient have financial barriers related to discharge medications?: No  Summary/Recommendations:   Patient is a 65 year old female admitted involuntarily with a diagnosis of Major depressive disorder,recurrent severe without psychotic features and Schizoaffective disorder, bipolar type. Information was obtained from psychosocial assessment completed with patient and chart review conducted by this evaluator. Patient presented to the hospital with bizarre and psychotic behavior. Patient's family reports primary triggers for admission were being off her medications. Patient sees a provider with [facility] in [place] and has support from her children. Patient will benefit from crisis stabilization, medication evaluation, group therapy and psycho education in addition to case management for discharge. At discharge, it is recommended that patient remain compliant with established discharge plan and continued treatment.   [name] Long, [place] 06/15/2016 3:20 PM

## 2016-06-15 NOTE — Progress Notes (Signed)
Recreation Therapy Notes  Date: 12.13.17 Time: 9:30 am Location: Craft Room  Group Topic: Self-esteem  Goal Area(s) Addresses:  Patient will write at least one positive trait about self. Patient will verbalize benefit of having healthy self-esteem.  Behavioral Response: Did not attend  Intervention: I Am  Activity: Patients were given a worksheet with the letter I on it and were instructed to write as many positive traits inside the letter.  Education: LRT educated patients on ways they can increase their self-esteem.  Education Outcome: Patient did not attend group.  Clinical Observations/Feedback: Patient did not attend group.  Leonette [facility], LRT/CTRS 06/15/2016 10:13 AM

## 2016-06-15 NOTE — BHH Suicide Risk Assessment (Signed)
[place] INPATIENT:  Family/Significant Other Suicide Prevention Education  Suicide Prevention Education:  Education Completed;Shareita Kraska(daughter [phone]), has been identified by the patient as the family member/significant other with whom the patient will be residing, and identified as the person(s) who will aid the patient in the event of a mental health crisis (suicidal ideations/suicide attempt).  With written consent from the patient, the family member/significant other has been provided the following suicide prevention education, prior to the and/or following the discharge of the patient.  The suicide prevention education provided includes the following:  Suicide risk factors  Suicide prevention and interventions  National Suicide Hotline telephone number  [facility] assessment telephone number  [facility] Emergency Assistance [place] and/or Residential Mobile Crisis Unit telephone number  Request made of family/significant other to:  Remove weapons (e.g., guns, rifles, knives), all items previously/currently identified as safety concern.    Remove drugs/medications (over-the-counter, prescriptions, illicit drugs), all items previously/currently identified as a safety concern.  The family member/significant other verbalizes understanding of the suicide prevention education information provided.  The family member/significant other agrees to remove the items of safety concern listed above.  Amaris G. [employer], [place] 06/15/2016, 3:00 PM

## 2016-06-15 NOTE — Progress Notes (Signed)
D: Patient is alert and oriented to place and person on the unit this shift. Patient not attended and  participated in groups today. Patient denies suicidal ideation, homicidal ideation, auditory or visual hallucinations at the present time. Complains opf pain generalized 7/10 A: Scheduled medications are administered to patient as per MD orders. Patient refused 3/4 of medications ordered Emotional support and encouragement are provided. Patient is maintained on q.15 minute safety checks. Patient is informed to notify staff with questions or concerns. R: No adverse medication reactions are noted. Patient is not cooperative with medication administration and treatment plan today. Patient is non receptive,  Angry,pensive, argumentative  on the unit at this time. Patient does not interact  with others on the unit this shift. Patient contracts for safety at this time. Patient remains safe at this time. Depression 2/10 Anxiety 2/10

## 2016-06-15 NOTE — Progress Notes (Signed)
[facility] MD Progress Note  06/15/2016 11:43 AM [name] Long  MRN:  [id number]  Subjective:  [name] Long is much more pleasant today. She is still giddy, giggly, intrusive and disorganized. She slept 3 hours only last night. She refused medications last night except for Depakote. She tolerates medications well. I observed her talking to her daughter on the phone. They were having almost normal conversation. She complains of back pain. She has not been going to groups. keeping, into his  Per nursing: D: Patient still labile and irritable at times. Denies SI/HI/AVH at this time. Refusing HS medications except Depakote. Patient has been visible in the milieu. Laughs inappropriate when asked questions. Verbally aggressive.  A: Medication given with education. Encouragement provided.  R: Patient compliant with Depakote but not Zyprexa or Trazodone. Safety maintained with 15 min checks.   Principal Problem: Schizoaffective disorder, bipolar type ([place]) Diagnosis:   Patient Active Problem List   Diagnosis Date Noted  . Major depressive disorder, recurrent severe without psychotic features ([place]) [F33.2] 06/13/2016  . Schizoaffective disorder, bipolar type ([place]) [F25.0]   . URI (upper respiratory infection) [J06.9] 03/09/2016  . Muscle strain of chest wall [S29.011A] 10/21/2015  . Activities of daily living deficit involving total body bathing [IMO0001] 08/10/2015  . Vitamin D deficiency [E55.9] 12/19/2014  . Preventative health care [Z00.00] 03/21/2014  . Bronchitis, chronic obstructive w acute bronchitis ([place]) [J44.0] 01/07/2014  . Osteoarthritis, multiple sites [M15.9] 06/08/2011  . Vaginal discharge [N89.8] 01/14/2011  . COPD (chronic obstructive pulmonary disease) ([place]) [J44.9] 09/23/2010  . CHRONIC KIDNEY DISEASE STAGE II (MILD) [N18.2] 09/14/2009  . Hypothyroidism [E03.9] 08/31/2006  . Type 2 diabetes, diet controlled ([place]) [E11.9] 08/31/2006  . HYPERCHOLESTEROLEMIA [E78.00] 08/31/2006  .  Tobacco use disorder [F17.200] 08/31/2006  . HYPERTENSION, BENIGN SYSTEMIC [I10] 08/31/2006   Total Time spent with patient: 20 minutes  Past Psychiatric History: Bipolar disorder.  Past Medical History:  Past Medical History:  Diagnosis Date  . BIPOLAR DISORDER 08/31/2006   Qualifier: Diagnosis of  By: Dorathy Daft MD, Marjory Lies    . CHRONIC KIDNEY DISEASE STAGE II (MILD) 09/14/2009   Annotation: eGFR 64 Qualifier: Diagnosis of  By: Jess Barters MD, [name]    . COPD (chronic obstructive pulmonary disease) ([place]) 09/23/2010   Diagnosed at [facility] in [facility] (Dr. Annamaria Boots)   . DM (diabetes mellitus) type II controlled with renal manifestation ([place]) 08/31/2006   Qualifier: Diagnosis of  By: Dorathy Daft MD, Marjory Lies    . HYPERCHOLESTEROLEMIA 08/31/2006   Intolerance to Lipitor OK on Crestor but medicaid no longer covering    . HYPERTENSION, BENIGN SYSTEMIC 08/31/2006   Qualifier: Diagnosis of  By: Dorathy Daft MD, Marjory Lies    . HYPOTHYROIDISM, UNSPECIFIED 08/31/2006   Qualifier: Diagnosis of  By: Dorathy Daft MD, Marjory Lies      Past Surgical History:  Procedure Laterality Date  . CESAREAN SECTION    . FOOT SURGERY    . KNEE SURGERY Bilateral    Family History: History reviewed. No pertinent family history. Family Psychiatric  History: See H&P. Social History:  History  Alcohol Use  . Yes     History  Drug Use No    Social History   Social History  . Marital status: Divorced    Spouse name: N/A  . Number of children: N/A  . Years of education: N/A   Social History Main Topics  . Smoking status: Current Every Day Smoker    Packs/day: 0.50    Years: 39.00    Types: Cigarettes  .  Smokeless tobacco: Never Used  . Alcohol use Yes  . Drug use: No  . Sexual activity: Not Asked   Other Topics Concern  . None   Social History Narrative  . None   Additional Social History:                         Sleep: Poor  Appetite:  Fair  Current Medications: Current Facility-Administered Medications   Medication Dose Route Frequency Provider Last Rate Last Dose  . acetaminophen (TYLENOL) tablet 650 mg  650 mg Oral Q6H PRN Jolanta B Pucilowska, MD   650 mg at 06/15/16 0600  . albuterol (PROVENTIL HFA;VENTOLIN HFA) 108 (90 Base) MCG/ACT inhaler 1-2 puff  1-2 puff Inhalation Q6H PRN Jolanta B Pucilowska, MD   2 puff at 06/15/16 0600  . alum & mag hydroxide-simeth (MAALOX/MYLANTA) 200-200-20 MG/5ML suspension 30 mL  30 mL Oral Q4H PRN Jolanta B Pucilowska, MD      . amLODipine (NORVASC) tablet 5 mg  5 mg Oral Daily Jolanta B Pucilowska, MD   5 mg at 06/15/16 0837  . divalproex (DEPAKOTE) DR tablet 500 mg  500 mg Oral Q8H Jolanta B Pucilowska, MD   500 mg at 06/15/16 0600  . docusate sodium (COLACE) capsule 100 mg  100 mg Oral BID Jolanta B Pucilowska, MD      . hydrOXYzine (ATARAX/VISTARIL) tablet 25 mg  25 mg Oral TID PRN Jolanta B Pucilowska, MD      . levothyroxine (SYNTHROID, LEVOTHROID) tablet 50 mcg  50 mcg Oral QAC breakfast Jolanta B Pucilowska, MD   50 mcg at 06/15/16 0600  . magnesium hydroxide (MILK OF MAGNESIA) suspension 30 mL  30 mL Oral Daily PRN Jolanta B Pucilowska, MD      . nicotine (NICODERM CQ - dosed in mg/24 hours) patch 21 mg  21 mg Transdermal Q0600 Jolanta B Pucilowska, MD      . nitroGLYCERIN (NITROSTAT) SL tablet 0.4 mg  0.4 mg Sublingual Q5 min PRN Jolanta B Pucilowska, MD      . OLANZapine zydis (ZYPREXA) disintegrating tablet 15 mg  15 mg Oral QHS Jolanta B Pucilowska, MD      . pantoprazole (PROTONIX) EC tablet 40 mg  40 mg Oral Daily Jolanta B Pucilowska, MD   40 mg at 06/15/16 0837  . polyethylene glycol (MIRALAX / GLYCOLAX) packet 17 g  17 g Oral Daily Jolanta B Pucilowska, MD   17 g at 06/14/16 0848  . traZODone (DESYREL) tablet 100 mg  100 mg Oral QHS Clovis Fredrickson, MD        Lab Results:  Results for orders placed or performed during the hospital encounter of 06/10/16 (from the past 48 hour(s))  Glucose, capillary     Status: None   Collection Time:  06/13/16 12:10 PM  Result Value Ref Range   Glucose-Capillary 75 65 - 99 mg/dL  Glucose, capillary     Status: None   Collection Time: 06/13/16  2:48 PM  Result Value Ref Range   Glucose-Capillary 95 65 - 99 mg/dL  Glucose, capillary     Status: Abnormal   Collection Time: 06/13/16  4:47 PM  Result Value Ref Range   Glucose-Capillary 117 (H) 65 - 99 mg/dL   Comment 1 Notify RN     Blood Alcohol level:  Lab Results  Component Value Date   ETH <5 32/20/2542    Metabolic Disorder Labs: Lab Results  Component Value Date  HGBA1C 6.3 (H) 06/10/2016   MPG 134 06/10/2016   No results found for: PROLACTIN Lab Results  Component Value Date   CHOL 156 02/02/2016   TRIG 128 02/02/2016   HDL 49 02/02/2016   CHOLHDL 3.2 02/02/2016   VLDL 26 02/02/2016   LDLCALC 81 02/02/2016   LDLCALC 97 12/18/2014    Physical Findings: AIMS:  , ,  ,  , Dental Status Current problems with teeth and/or dentures?: No Does patient usually wear dentures?: No  CIWA:    COWS:     Musculoskeletal: Strength & Muscle Tone: within normal limits Gait & Station: normal Patient leans: N/A  Psychiatric Specialty Exam: Physical Exam  Nursing note and vitals reviewed.   Review of Systems  Musculoskeletal: Positive for back pain.  Psychiatric/Behavioral: Positive for hallucinations. The patient has insomnia.   All other systems reviewed and are negative.   Blood pressure 138/83, pulse 72, temperature 98.6 F (37 C), temperature source Oral, resp. rate 16, height _0  (1.575 m), weight 66.7 kg (147 lb), SpO2 100 %.Body mass index is 26.89 kg/m.  General Appearance: Casual  Eye Contact:  Good  Speech:  Pressured  Volume:  Increased  Mood:  Euphoric  Affect:  Congruent  Thought Process:  Disorganized and Descriptions of Associations: Tangential  Orientation:  Full (Time, Place, and Person)  Thought Content:  Delusions, Hallucinations: Auditory and Paranoid Ideation  Suicidal Thoughts:  No   Homicidal Thoughts:  No  Memory:  Immediate;   Fair Recent;   Fair Remote;   Fair  Judgement:  Poor  Insight:  Lacking  Psychomotor Activity:  Increased  Concentration:  Concentration: Fair and Attention Span: Fair  Recall:  [employer] of Knowledge:  Fair  Language:  Fair  Akathisia:  No  Handed:  Right  AIMS (if indicated):     Assets:  Communication Skills Desire for Improvement Financial Resources/Insurance Housing Physical Health Resilience Social Support  ADL's:  Intact  Cognition:  WNL  Sleep:  Number of Hours: 3.75     Treatment Plan Summary: Daily contact with patient to assess and evaluate symptoms and progress in treatment and Medication management   Ms. Kingma is a 36-year0old female with a history of schizoaffective disorder admitted in a psychotic episode.  1. Psychosis. She was started on a combination of Depakote and Zyprexa. We will offer Zyprexa now. If refused, will start forced medications.  2. Hypothyroidism. She is on synthroid.  3. HTN. She is on Norvasc.  4. COPD. She is on inhaler.  5. CAD. Nitroglycerine is available.  6. Smoking. Nicotine patch is available.  7. GERD. She is on protonix.  8. Constipation. She is on bowel regimen.  9. Insomnia. She is on trazodone.  10. Metabolic syndrome monitoring. Lipid profile and hemoglobin A1c were performed recently.    11. Disposition. She will be discharge to home with family. She will follow up with [facility].  Orson Slick, MD 06/15/2016, 11:43 AM

## 2016-06-15 NOTE — Progress Notes (Signed)
Pt awake, alert, oriented and up on unit today. Appropriately interacts with staff/peers. Interaction with peers is minimal. Pt spends most of the day sitting in her room, on the side of her bed, staring out the door into the hallway and nurse's station. Pt pleasant with this nurse. Smiles on interaction. No verbal aggression noted/observed today. Does not attend groups. Pt disorganized with thoughts stating, "I been in hospital for 45 days. They had to give me fluids to get all that cortisone out of my system.They stuck me all over." Pt points to sites on her arms where blood has been drawn. Pt does continue to believe she is discharging, "my son is on his way to get me. I need my hat and scarf before he gets here. I will take my meds when he gets here." Pt has paper bag packed with belongings and ready to go, but she is easily redirected. Pt continues to be suspicious, somewhat guarded. Denies SI/HI/AVH. Medication compliant today.   Support and encouragement provided with use of therapeutic communication. Mediations administered as ordered with education. Safety maintained with every 15 minute checks. Will continue to monitor.

## 2016-06-15 NOTE — Progress Notes (Signed)
Pt compliant with PO zyprexa as ordered, see MAR.

## 2016-06-16 MED ORDER — TEMAZEPAM 15 MG PO CAPS: 15.0000 mg | ORAL_CAPSULE | Freq: Every evening | ORAL | Status: DC | PRN

## 2016-06-16 MED ORDER — OLANZAPINE 5 MG PO TBDP
30.0000 mg | ORAL_TABLET | Freq: Every day | ORAL | Status: DC
  Administered 2016-06-17: 30 mg via ORAL
  Filled 2016-06-16 (×5): qty 6

## 2016-06-16 NOTE — Tx Team (Signed)
Interdisciplinary Treatment and Diagnostic Plan Update  06/16/2016 Time of Session: 11:22am [name] Long MRN: [id number]  Principal Diagnosis: Schizoaffective disorder, bipolar type ([place])  Secondary Diagnoses: Principal Problem:   Schizoaffective disorder, bipolar type ([place]) Active Problems:   Hypothyroidism   Type 2 diabetes, diet controlled ([place])   Tobacco use disorder   COPD (chronic obstructive pulmonary disease) ([place])   Major depressive disorder, recurrent severe without psychotic features ([place])   Current Medications:  Current Facility-Administered Medications  Medication Dose Route Frequency Provider Last Rate Last Dose  . acetaminophen (TYLENOL) tablet 650 mg  650 mg Oral Q6H PRN Jolanta B Pucilowska, MD   650 mg at 06/15/16 0600  . albuterol (PROVENTIL HFA;VENTOLIN HFA) 108 (90 Base) MCG/ACT inhaler 1-2 puff  1-2 puff Inhalation Q6H PRN Clovis Fredrickson, MD   2 puff at 06/15/16 1221  . alum & mag hydroxide-simeth (MAALOX/MYLANTA) 200-200-20 MG/5ML suspension 30 mL  30 mL Oral Q4H PRN Jolanta B Pucilowska, MD      . amLODipine (NORVASC) tablet 5 mg  5 mg Oral Daily Clovis Fredrickson, MD   5 mg at 06/16/16 0821  . divalproex (DEPAKOTE) DR tablet 500 mg  500 mg Oral Q8H Jolanta B Pucilowska, MD   500 mg at 06/16/16 0821  . docusate sodium (COLACE) capsule 100 mg  100 mg Oral BID Jolanta B Pucilowska, MD      . hydrOXYzine (ATARAX/VISTARIL) tablet 25 mg  25 mg Oral TID PRN Jolanta B Pucilowska, MD      . levothyroxine (SYNTHROID, LEVOTHROID) tablet 50 mcg  50 mcg Oral QAC breakfast Clovis Fredrickson, MD   50 mcg at 06/16/16 0821  . magnesium hydroxide (MILK OF MAGNESIA) suspension 30 mL  30 mL Oral Daily PRN Jolanta B Pucilowska, MD      . nicotine (NICODERM CQ - dosed in mg/24 hours) patch 21 mg  21 mg Transdermal Q0600 Jolanta B Pucilowska, MD      . nitroGLYCERIN (NITROSTAT) SL tablet 0.4 mg  0.4 mg Sublingual Q5 min PRN Jolanta B Pucilowska, MD      . OLANZapine  zydis (ZYPREXA) disintegrating tablet 15 mg  15 mg Oral QHS Jolanta B Pucilowska, MD      . pantoprazole (PROTONIX) EC tablet 40 mg  40 mg Oral Daily Clovis Fredrickson, MD   40 mg at 06/16/16 0821  . polyethylene glycol (MIRALAX / GLYCOLAX) packet 17 g  17 g Oral Daily Clovis Fredrickson, MD   17 g at 06/16/16 [id number]  . traZODone (DESYREL) tablet 100 mg  100 mg Oral QHS Jolanta B Pucilowska, MD       PTA Medications: Prescriptions Prior to Admission  Medication Sig Dispense Refill Last Dose  . albuterol (PROVENTIL HFA) 108 (90 Base) MCG/ACT inhaler Inhale 2 puffs into the lungs every 4 (four) hours as needed. For wheezing 1 Inhaler 5 Unknown at Unknown  . amLODipine (NORVASC) 5 MG tablet Take 1 tablet (5 mg total) by mouth daily. 90 tablet 3 On file  . aspirin ([employer] ASPIRIN) 81 MG chewable tablet Chew 81 mg by mouth daily.     Unknown at Unknown  . beclomethasone (QVAR) 80 MCG/ACT inhaler Inhale 1 puff into the lungs 2 (two) times daily. 8.7 g 12 Unknown at Unknown  . divalproex (DEPAKOTE ER) 500 MG 24 hr tablet Take 1 tablet (500 mg total) by mouth 2 (two) times daily. 60 tablet 0   . docusate sodium (COLACE) 100 MG capsule Take 1  capsule (100 mg total) by mouth every 12 (twelve) hours. 60 capsule 0 Unknown at Unknown  . fluticasone (FLONASE) 50 MCG/ACT nasal spray Place 2 sprays into both nostrils daily. (Patient not taking: Reported on 06/10/2016) 16 g 1 Completed Course at Unknown time  . glucose blood (ACCU-CHEK AVIVA PLUS) test strip Use as instructed (Patient not taking: Reported on 06/10/2016) 25 each 12 Not Taking at Unknown time  . ibuprofen (ADVIL,MOTRIN) 800 MG tablet Take 1 tablet (800 mg total) by mouth 3 (three) times daily. 21 tablet 0 Unknown at Unknown  . Lancets (ACCU-CHEK MULTICLIX) lancets TEST BLOOD SUGAR 1 TO 3 TIMES PER WEEK (Patient not taking: Reported on 06/10/2016) 102 each 3 Not Taking at Unknown time  . levothyroxine (SYNTHROID) 50 MCG tablet Take 1 tablet  (50 mcg total) by mouth daily before breakfast. 90 tablet 3 Unknown at Unknown  . nitroGLYCERIN (NITROSTAT) 0.4 MG SL tablet Place 1 tablet (0.4 mg total) under the tongue every 5 (five) minutes as needed for chest pain. 50 tablet 3 Unknown at Unknown  . OLANZapine zydis (ZYPREXA) 10 MG disintegrating tablet Take 1 tablet (10 mg total) by mouth at bedtime. 30 tablet 0   . olopatadine (PATANOL) 0.1 % ophthalmic solution Place 1 drop into both eyes 2 (two) times daily. 5 mL 2 Unknown at Unknown  . omeprazole (PRILOSEC) 40 MG capsule Take 1 capsule (40 mg total) by mouth daily. 30 capsule 11 Unknown at Unknown  . polyethylene glycol (MIRALAX) packet Take 17 g by mouth daily. 14 each 0 Unknown at Unknown  . pravastatin (PRAVACHOL) 40 MG tablet take 1 tablet by mouth every evening (Patient taking differently: Take 40 mg by mouth every evening. ) 30 tablet 11 Unknown at Unknown  . risperiDONE (RISPERDAL M-TABS) 0.5 MG disintegrating tablet Take 1 tablet (0.5 mg total) by mouth 2 (two) times daily. 60 tablet 0     Patient Stressors: Medication change or noncompliance  Patient Strengths: General fund of knowledge Supportive family/friends  Treatment Modalities: Medication Management, Group therapy, Case management,  1 to 1 session with clinician, Psychoeducation, Recreational therapy.   Physician Treatment Plan for Primary Diagnosis: Schizoaffective disorder, bipolar type ([place]) Long Term Goal(s): Improvement in symptoms so as ready for discharge NA   Short Term Goals: Ability to identify changes in lifestyle to reduce recurrence of condition will improve Ability to verbalize feelings will improve Ability to disclose and discuss suicidal ideas Ability to demonstrate self-control will improve Ability to identify and develop effective coping behaviors will improve Ability to maintain clinical measurements within normal limits will improve Compliance with prescribed medications will  improve NA  Medication Management: Evaluate patient's response, side effects, and tolerance of medication regimen.  Therapeutic Interventions: 1 to 1 sessions, Unit Group sessions and Medication administration.  Evaluation of Outcomes: Progressing  Physician Treatment Plan for Secondary Diagnosis: Principal Problem:   Schizoaffective disorder, bipolar type ([place]) Active Problems:   Hypothyroidism   Type 2 diabetes, diet controlled ([place])   Tobacco use disorder   COPD (chronic obstructive pulmonary disease) ([facility])   Major depressive disorder, recurrent severe without psychotic features ([place])  Long Term Goal(s): Improvement in symptoms so as ready for discharge NA   Short Term Goals: Ability to identify changes in lifestyle to reduce recurrence of condition will improve Ability to verbalize feelings will improve Ability to disclose and discuss suicidal ideas Ability to demonstrate self-control will improve Ability to identify and develop effective coping behaviors will improve Ability to maintain clinical  measurements within normal limits will improve Compliance with prescribed medications will improve NA     Medication Management: Evaluate patient's response, side effects, and tolerance of medication regimen.  Therapeutic Interventions: 1 to 1 sessions, Unit Group sessions and Medication administration.  Evaluation of Outcomes: Progressing   RN Treatment Plan for Primary Diagnosis: Schizoaffective disorder, bipolar type ([place]) Long Term Goal(s): Knowledge of disease and therapeutic regimen to maintain health will improve  Short Term Goals: Ability to verbalize frustration and anger appropriately will improve, Ability to demonstrate self-control, Ability to participate in decision making will improve, Ability to identify and develop effective coping behaviors will improve and Compliance with prescribed medications will improve  Medication Management: RN will administer medications  as ordered by provider, will assess and evaluate patient's response and provide education to patient for prescribed medication. RN will report any adverse and/or side effects to prescribing provider.  Therapeutic Interventions: 1 on 1 counseling sessions, Psychoeducation, Medication administration, Evaluate responses to treatment, Monitor vital signs and CBGs as ordered, Perform/monitor CIWA, COWS, AIMS and Fall Risk screenings as ordered, Perform wound care treatments as ordered.  Evaluation of Outcomes: Progressing   LCSW Treatment Plan for Primary Diagnosis: Schizoaffective disorder, bipolar type ([place]) Long Term Goal(s): Safe transition to appropriate next level of care at discharge, Engage patient in therapeutic group addressing interpersonal concerns.  Short Term Goals: Engage patient in aftercare planning with referrals and resources, Increase social support, Increase ability to appropriately verbalize feelings and Increase skills for wellness and recovery  Therapeutic Interventions: Assess for all discharge needs, 1 to 1 time with Social worker, Explore available resources and support systems, Assess for adequacy in community support network, Educate family and significant other(s) on suicide prevention, Complete Psychosocial Assessment, Interpersonal group therapy.  Evaluation of Outcomes: Progressing   Progress in Treatment: Attending groups: No. Participating in groups: No. Taking medication as prescribed: No. Toleration medication: No. Family/Significant other contact made: Yes, individual(s) contacted:  daughter Patient understands diagnosis: No. Discussing patient identified problems/goals with staff: No. Medical problems stabilized or resolved: Yes. Denies suicidal/homicidal ideation: Yes. Issues/concerns per patient self-inventory: No. Other: n/a  New problem(s) identified: None identified at this time.   New Short Term/Long Term Goal(s): None identified at this time.    Discharge Plan or Barriers: Patient will discharge home and follow-up with [facility].  Reason for Continuation of Hospitalization: Depression Medication stabilization  Estimated Length of Stay: 7 days.  Attendees: Patient:[name] Long 06/16/2016 11:22 AM  Physician: Dr. Bary Leriche, MD 06/16/2016 11:22 AM  Nursing: Polly Cobia, RN 06/16/2016 11:22 AM  RN Care Manager: 06/16/2016 11:22 AM  Social Worker: Lear Ng. Claybon Jabs MSW, [place] 06/16/2016 11:22 AM  Recreational Therapist:  06/16/2016 11:22 AM  Other:  06/16/2016 11:22 AM  Other:  06/16/2016 11:22 AM  Other: 06/16/2016 11:22 AM    Scribe for Treatment Team: Jolaine Click, LCSWA 06/16/2016 11:27 AM

## 2016-06-16 NOTE — Plan of Care (Signed)
Problem: Bowel/Gastric: Goal: Will not experience complications related to bowel motility Outcome: Progressing Patient taking [place]ira lax  Refused colace . Bowel movement  Daily   Voice no complaints

## 2016-06-16 NOTE — Progress Notes (Signed)
[facility] MD Progress Note  06/16/2016 9:37 AM [name] Long  MRN:  [id number]  Subjective:  [name] Long is a 65 year old female with history of bipolar disorder admitted in a manic dysphoric episode in the context of treatment noncompliance. She was restarted on a combination of Zyprexa and Depakote but has been frequently refusing medications and takes them only with much encouragement.   [name] Long is very irritable today. She refused her morning medications and took them later on with much encouragement. Her daughter called telling us that the patient missed the deadline to apply for her section 8 housing and they lose it. She was asking for a letter stating that the patient has mental illness and has not been compliant with medications making her unable to take care of business. She slept 3 hours only again. She has not been going to groups.   Per nursing: D: Patient is alert and oriented to place and person on the unit this shift. Patient not attended and  participated in groups today. Patient denies suicidal ideation, homicidal ideation, auditory or visual hallucinations at the present time. Complains opf pain generalized 7/10 A: Scheduled medications are administered to patient as per MD orders. Patient refused 3/4 of medications ordered Emotional support and encouragement are provided. Patient is maintained on q.15 minute safety checks. Patient is informed to notify staff with questions or concerns. R: No adverse medication reactions are noted. Patient is not cooperative with medication administration and treatment plan today. Patient is non receptive,  Angry,pensive, argumentative  on the unit at this time. Patient does not interact  with others on the unit this shift. Patient contracts for safety at this time. Patient remains safe at this time. Depression 2/10 Anxiety 2/10  Principal Problem: Schizoaffective disorder, bipolar type ([place]) Diagnosis:   Patient Active Problem List   Diagnosis Date Noted  . Major depressive disorder, recurrent severe without psychotic features ([place]) [F33.2] 06/13/2016  . Schizoaffective disorder, bipolar type ([place]) [F25.0]   . URI (upper respiratory infection) [J06.9] 03/09/2016  . Muscle strain of chest wall [S29.011A] 10/21/2015  . Activities of daily living deficit involving total body bathing [IMO0001] 08/10/2015  . Vitamin D deficiency [E55.9] 12/19/2014  . Preventative health care [Z00.00] 03/21/2014  . Bronchitis, chronic obstructive w acute bronchitis ([place]) [J44.0] 01/07/2014  . Osteoarthritis, multiple sites [M15.9] 06/08/2011  . Vaginal discharge [N89.8] 01/14/2011  . COPD (chronic obstructive pulmonary disease) ([place]) [J44.9] 09/23/2010  . CHRONIC KIDNEY DISEASE STAGE II (MILD) [N18.2] 09/14/2009  . Hypothyroidism [E03.9] 08/31/2006  . Type 2 diabetes, diet controlled ([place]) [E11.9] 08/31/2006  . HYPERCHOLESTEROLEMIA [E78.00] 08/31/2006  . Tobacco use disorder [F17.200] 08/31/2006  . HYPERTENSION, BENIGN SYSTEMIC [I10] 08/31/2006   Total Time spent with patient: 20 minutes  Past Psychiatric History: Bipolar disorder.  Past Medical History:  Past Medical History:  Diagnosis Date  . BIPOLAR DISORDER 08/31/2006   Qualifier: Diagnosis of  By: Dorathy Daft MD, Marjory Lies    . CHRONIC KIDNEY DISEASE STAGE II (MILD) 09/14/2009   Annotation: eGFR 64 Qualifier: Diagnosis of  By: Jess Barters MD, [name]    . COPD (chronic obstructive pulmonary disease) ([place]) 09/23/2010   Diagnosed at [facility] in 2008 (Dr. Annamaria Boots)   . DM (diabetes mellitus) type II controlled with renal manifestation ([place]) 08/31/2006   Qualifier: Diagnosis of  By: Dorathy Daft MD, Marjory Lies    . HYPERCHOLESTEROLEMIA 08/31/2006   Intolerance to Lipitor OK on Crestor but medicaid no longer covering    . HYPERTENSION, BENIGN SYSTEMIC 08/31/2006  Qualifier: Diagnosis of  By: Dorathy Daft MD, Marjory Lies    . HYPOTHYROIDISM, UNSPECIFIED 08/31/2006   Qualifier: Diagnosis of  By: Dorathy Daft MD, Marjory Lies       Past Surgical History:  Procedure Laterality Date  . CESAREAN SECTION    . FOOT SURGERY    . KNEE SURGERY Bilateral    Family History: History reviewed. No pertinent family history. Family Psychiatric  History: See H&P. Social History:  History  Alcohol Use  . Yes     History  Drug Use No    Social History   Social History  . Marital status: Divorced    Spouse name: N/A  . Number of children: N/A  . Years of education: N/A   Social History Main Topics  . Smoking status: Current Every Day Smoker    Packs/day: 0.50    Years: 39.00    Types: Cigarettes  . Smokeless tobacco: Never Used  . Alcohol use Yes  . Drug use: No  . Sexual activity: Not Asked   Other Topics Concern  . None   Social History Narrative  . None   Additional Social History:                         Sleep: Poor  Appetite:  Fair  Current Medications: Current Facility-Administered Medications  Medication Dose Route Frequency Provider Last Rate Last Dose  . acetaminophen (TYLENOL) tablet 650 mg  650 mg Oral Q6H PRN Jolanta B Pucilowska, MD   650 mg at 06/15/16 0600  . albuterol (PROVENTIL HFA;VENTOLIN HFA) 108 (90 Base) MCG/ACT inhaler 1-2 puff  1-2 puff Inhalation Q6H PRN Clovis Fredrickson, MD   2 puff at 06/15/16 1221  . alum & mag hydroxide-simeth (MAALOX/MYLANTA) 200-200-20 MG/5ML suspension 30 mL  30 mL Oral Q4H PRN Jolanta B Pucilowska, MD      . amLODipine (NORVASC) tablet 5 mg  5 mg Oral Daily Clovis Fredrickson, MD   5 mg at 06/16/16 0821  . divalproex (DEPAKOTE) DR tablet 500 mg  500 mg Oral Q8H Jolanta B Pucilowska, MD   500 mg at 06/16/16 0821  . docusate sodium (COLACE) capsule 100 mg  100 mg Oral BID Jolanta B Pucilowska, MD      . hydrOXYzine (ATARAX/VISTARIL) tablet 25 mg  25 mg Oral TID PRN Jolanta B Pucilowska, MD      . levothyroxine (SYNTHROID, LEVOTHROID) tablet 50 mcg  50 mcg Oral QAC breakfast Clovis Fredrickson, MD   50 mcg at 06/16/16 0821  . magnesium  hydroxide (MILK OF MAGNESIA) suspension 30 mL  30 mL Oral Daily PRN Jolanta B Pucilowska, MD      . nicotine (NICODERM CQ - dosed in mg/24 hours) patch 21 mg  21 mg Transdermal Q0600 Jolanta B Pucilowska, MD      . nitroGLYCERIN (NITROSTAT) SL tablet 0.4 mg  0.4 mg Sublingual Q5 min PRN Jolanta B Pucilowska, MD      . OLANZapine zydis (ZYPREXA) disintegrating tablet 15 mg  15 mg Oral QHS Jolanta B Pucilowska, MD      . pantoprazole (PROTONIX) EC tablet 40 mg  40 mg Oral Daily Clovis Fredrickson, MD   40 mg at 06/16/16 0821  . polyethylene glycol (MIRALAX / GLYCOLAX) packet 17 g  17 g Oral Daily Clovis Fredrickson, MD   17 g at 06/16/16 2202  . traZODone (DESYREL) tablet 100 mg  100 mg Oral QHS Clovis Fredrickson, MD  Lab Results:  No results found for this or any previous visit (from the past 48 hour(s)).  Blood Alcohol level:  Lab Results  Component Value Date   ETH <5 51/83/4373    Metabolic Disorder Labs: Lab Results  Component Value Date   HGBA1C 6.3 (H) 06/10/2016   MPG 134 06/10/2016   No results found for: PROLACTIN Lab Results  Component Value Date   CHOL 156 02/02/2016   TRIG 128 02/02/2016   HDL 49 02/02/2016   CHOLHDL 3.2 02/02/2016   VLDL 26 02/02/2016   LDLCALC 81 02/02/2016   LDLCALC 97 12/18/2014    Physical Findings: AIMS:  , ,  ,  , Dental Status Current problems with teeth and/or dentures?: No Does patient usually wear dentures?: No  CIWA:    COWS:     Musculoskeletal: Strength & Muscle Tone: within normal limits Gait & Station: normal Patient leans: N/A  Psychiatric Specialty Exam: Physical Exam  Nursing note and vitals reviewed.   Review of Systems  Musculoskeletal: Positive for back pain.  Psychiatric/Behavioral: Positive for hallucinations. The patient has insomnia.   All other systems reviewed and are negative.   Blood pressure (!) 147/84, pulse 77, temperature 98.6 F (37 C), temperature source Oral, resp. rate 18, height  '5\' 2"'  (1.575 m), weight 66.7 kg (147 lb), SpO2 100 %.Body mass index is 26.89 kg/m.  General Appearance: Casual  Eye Contact:  Good  Speech:  Pressured  Volume:  Increased  Mood:  Euphoric  Affect:  Congruent  Thought Process:  Disorganized and Descriptions of Associations: Tangential  Orientation:  Full (Time, Place, and Person)  Thought Content:  Delusions, Hallucinations: Auditory and Paranoid Ideation  Suicidal Thoughts:  No  Homicidal Thoughts:  No  Memory:  Immediate;   Fair Recent;   Fair Remote;   Fair  Judgement:  Poor  Insight:  Lacking  Psychomotor Activity:  Increased  Concentration:  Concentration: Fair and Attention Span: Fair  Recall:  [employer] of Knowledge:  Fair  Language:  Fair  Akathisia:  No  Handed:  Right  AIMS (if indicated):     Assets:  Communication Skills Desire for Improvement Financial Resources/Insurance Housing Physical Health Resilience Social Support  ADL's:  Intact  Cognition:  WNL  Sleep:  Number of Hours: 3.45     Treatment Plan Summary: Daily contact with patient to assess and evaluate symptoms and progress in treatment and Medication management   Ms. Coriz is a 56-year0old female with a history of schizoaffective disorder admitted in a psychotic episode.  1. Psychosis. She was started on a combination of Depakote and Zyprexa for mood stabilization. I increase Zyprexa to 30 mg nightly. VPA level in am.  2. Hypothyroidism. She is on synthroid.  3. HTN. She is on Norvasc.  4. COPD. She is on inhaler.  5. CAD. Nitroglycerine is available.  6. Smoking. Nicotine patch is available.  7. GERD. She is on protonix.  8. Constipation. She is on bowel regimen.  9. Insomnia. We increased Zyprexa and night and offer Restoril as needed. Discontinue Trazodone.  10. Metabolic syndrome monitoring. Lipid profile and hemoglobin A1c were performed recently.    11. Disposition. She will be discharged to home with family.  She will follow up with [facility].  Orson Slick, MD 06/16/2016, 9:37 AM

## 2016-06-16 NOTE — BHH Group Notes (Signed)
Goals Group  Date/Time: 06/16/2016 9am  Type of Therapy and Topic: Group Therapy: Goals Group: SMART Goals   Pt was called, but did not attend   [name] Long. R[place], LCSWA, LCAS

## 2016-06-16 NOTE — Progress Notes (Signed)
D: Patient remains disorganized this  Shift . Requesting items in locker that were not brought to unit . Appetite good at meals . Appropriate ADL and person chores . Patient denies suicidality , and Depression . Patient confused about  Leaving  Being discharged . Felt that writer was in the way of her going home . Patient  Cursed  Writer " I will whipe your mother fucking  Ass" Your not gonna block my leaving today".   No auditory hallucinations  No pain concerns .  A: Encourage patient participation with unit programming . Instruction  Given on  Medication , verbalize understanding. R: Voice no other concerns. Staff continue to monitor

## 2016-06-16 NOTE — BHH Group Notes (Signed)
[facility] LCSW Group Therapy   06/16/2016 2pm   Type of Therapy: Group Therapy   Participation Level: Pt invited but did not attend.   Participation Quality: Pt invited but did not attend.  Glorious Peach, MSW, LCSWA 06/16/2016, 2:51PM

## 2016-06-17 LAB — VALPROIC ACID LEVEL: Valproic Acid Lvl: 75 ug/mL (ref 50.0–100.0)

## 2016-06-17 MED ORDER — TEMAZEPAM 15 MG PO CAPS
30.0000 mg | ORAL_CAPSULE | Freq: Every day | ORAL | Status: DC
  Administered 2016-06-17 – 2016-06-19 (×2): 30 mg via ORAL
  Filled 2016-06-17 (×4): qty 2

## 2016-06-17 MED ORDER — HALOPERIDOL 5 MG PO TABS
5.0000 mg | ORAL_TABLET | Freq: Two times a day (BID) | ORAL | Status: DC
  Administered 2016-06-17: 5 mg via ORAL
  Filled 2016-06-17 (×3): qty 1

## 2016-06-17 NOTE — Plan of Care (Signed)
Problem: Health Behavior/Discharge Planning: Goal: Compliance with prescribed medication regimen will improve Outcome: Not Progressing Patient refused HS medications

## 2016-06-17 NOTE — BHH Group Notes (Signed)
[name]:  (Nursing/MHT/Case Management/Adjunct)  Date:  06/17/2016  Time:  5:57 AM  Type of Therapy:  Psychoeducational Skills  Participation Level:  Did Not Attend  Summary of Progress/Problems:  [name] Long 06/17/2016, 5:57 AM

## 2016-06-17 NOTE — Progress Notes (Signed)
D: Patient still verbally aggressive. States she's going to talk to her lawyer and sue this Probation officer and the hospital. Daughter brought in note from housing authority of [place]. Patient refused Zyprexa and Depakote. She stated she already got her medications for today. Requested her inhaler. Patient heard talking loudly to self.  A: Medications given with education. Encouragement provided.  R: Patient compliant with medication. Safety maintained with 15 min checks.

## 2016-06-17 NOTE — BHH Group Notes (Signed)
[facility] LCSW Group Therapy   06/17/2016 1 PM   Type of Therapy: Group Therapy   Participation Level: Pt invited but did not attend.  Participation Quality: Pt invited but did not attend.    Glorious Peach, MSW, [place] 06/17/2016, 1:31 PM

## 2016-06-17 NOTE — Progress Notes (Signed)
Pt pleasant and cooperative this am. Medication compliant other than stool softener. Pt reports bowels are moving just fine at present. Pt socializes with staff and peers appropriately, and spends time in dayroom.  Pt concerned with valproic acid levels drawn today. Encouragement and support offered. Medications given as prescribed. Safety checks maintained. Pt receptive and remains safe on unit with q 15 min checks.

## 2016-06-17 NOTE — Progress Notes (Signed)
Patient ID: [name] Long, female   DOB: [birth year], 65 y.o.   MRN: [id number]  CSW spoke with patients daughter [name] Long to update her on patient's progress and inform her that the letter Dr. Bary Leriche, MD wrote to help patient retain her housing was available to pick up. Daughter had no Questions or concerns at this time.   Amaris G. Claybon Jabs MSW, [facility] 06/17/2016 10:47 AM

## 2016-06-17 NOTE — Progress Notes (Signed)
[facility] MD Progress Note  06/17/2016 10:42 AM [name]  MRN:  [id number]  Subjective:  [name] Long is a 65 year old female with history of bipolar disorder admitted in a manic dysphoric episode in the context of treatment noncompliance. She was restarted on a combination of Zyprexa and Depakote but has been frequently refusing medications and takes them only with much encouragement.   [name] Long is very irritable today again. She refused her morning medications and took them later on with much encouragement. She is very delusional believing that her real name is [name] Long. She wants a Chief Executive Officer. Sleep is still a problem. She has not been going to groups. I will start Restoril standing and add haldol 5 mg bid.  Per nursing: D: Patient still verbally aggressive. States she's going to talk to her lawyer and sue this Probation officer and the hospital. Daughter brought in note from housing authority of [place]. Patient refused Zyprexa and Depakote. She stated she already got her medications for today. Requested her inhaler. Patient heard talking loudly to self.  A: Medications given with education. Encouragement provided.  R: Patient compliant with medication. Safety maintained with 15 min checks.   Principal Problem: Schizoaffective disorder, bipolar type ([place]) Diagnosis:   Patient Active Problem List   Diagnosis Date Noted  . Major depressive disorder, recurrent severe without psychotic features ([place]) [F33.2] 06/13/2016  . Schizoaffective disorder, bipolar type ([place]) [F25.0]   . URI (upper respiratory infection) [J06.9] 03/09/2016  . Muscle strain of chest wall [S29.011A] 10/21/2015  . Activities of daily living deficit involving total body bathing [IMO0001] 08/10/2015  . Vitamin D deficiency [E55.9] 12/19/2014  . Preventative health care [Z00.00] 03/21/2014  . Bronchitis, chronic obstructive w acute bronchitis ([place]) [J44.0] 01/07/2014  . Osteoarthritis, multiple sites [M15.9] 06/08/2011  . Vaginal  discharge [N89.8] 01/14/2011  . COPD (chronic obstructive pulmonary disease) ([place]) [J44.9] 09/23/2010  . CHRONIC KIDNEY DISEASE STAGE II (MILD) [N18.2] 09/14/2009  . Hypothyroidism [E03.9] 08/31/2006  . Type 2 diabetes, diet controlled ([place]) [E11.9] 08/31/2006  . HYPERCHOLESTEROLEMIA [E78.00] 08/31/2006  . Tobacco use disorder [F17.200] 08/31/2006  . HYPERTENSION, BENIGN SYSTEMIC [I10] 08/31/2006   Total Time spent with patient: 20 minutes  Past Psychiatric History: Bipolar disorder.  Past Medical History:  Past Medical History:  Diagnosis Date  . BIPOLAR DISORDER 08/31/2006   Qualifier: Diagnosis of  By: Dorathy Daft MD, Marjory Lies    . CHRONIC KIDNEY DISEASE STAGE II (MILD) 09/14/2009   Annotation: eGFR 64 Qualifier: Diagnosis of  By: Jess Barters MD, [name]    . COPD (chronic obstructive pulmonary disease) ([place]) 09/23/2010   Diagnosed at [facility] in 2008 (Dr. Annamaria Boots)   . DM (diabetes mellitus) type II controlled with renal manifestation ([place]) 08/31/2006   Qualifier: Diagnosis of  By: Dorathy Daft MD, Marjory Lies    . HYPERCHOLESTEROLEMIA 08/31/2006   Intolerance to Lipitor OK on Crestor but medicaid no longer covering    . HYPERTENSION, BENIGN SYSTEMIC 08/31/2006   Qualifier: Diagnosis of  By: Dorathy Daft MD, Marjory Lies    . HYPOTHYROIDISM, UNSPECIFIED 08/31/2006   Qualifier: Diagnosis of  By: Dorathy Daft MD, Marjory Lies      Past Surgical History:  Procedure Laterality Date  . CESAREAN SECTION    . FOOT SURGERY    . KNEE SURGERY Bilateral    Family History: History reviewed. No pertinent family history. Family Psychiatric  History: See H&P. Social History:  History  Alcohol Use  . Yes     History  Drug Use No    Social History  Social History  . Marital status: Divorced    Spouse name: N/A  . Number of children: N/A  . Years of education: N/A   Social History Main Topics  . Smoking status: Current Every Day Smoker    Packs/day: 0.50    Years: 39.00    Types: Cigarettes  . Smokeless tobacco: Never  Used  . Alcohol use Yes  . Drug use: No  . Sexual activity: Not Asked   Other Topics Concern  . None   Social History Narrative  . None   Additional Social History:                         Sleep: Poor  Appetite:  Fair  Current Medications: Current Facility-Administered Medications  Medication Dose Route Frequency Provider Last Rate Last Dose  . acetaminophen (TYLENOL) tablet 650 mg  650 mg Oral Q6H PRN Jolanta B Pucilowska, MD   650 mg at 06/15/16 0600  . albuterol (PROVENTIL HFA;VENTOLIN HFA) 108 (90 Base) MCG/ACT inhaler 1-2 puff  1-2 puff Inhalation Q6H PRN Clovis Fredrickson, MD   2 puff at 06/17/16 0641  . alum & mag hydroxide-simeth (MAALOX/MYLANTA) 200-200-20 MG/5ML suspension 30 mL  30 mL Oral Q4H PRN Jolanta B Pucilowska, MD      . amLODipine (NORVASC) tablet 5 mg  5 mg Oral Daily Jolanta B Pucilowska, MD   5 mg at 06/17/16 0753  . divalproex (DEPAKOTE) DR tablet 500 mg  500 mg Oral Q8H Jolanta B Pucilowska, MD   500 mg at 06/17/16 0640  . docusate sodium (COLACE) capsule 100 mg  100 mg Oral BID Jolanta B Pucilowska, MD      . hydrOXYzine (ATARAX/VISTARIL) tablet 25 mg  25 mg Oral TID PRN Jolanta B Pucilowska, MD      . levothyroxine (SYNTHROID, LEVOTHROID) tablet 50 mcg  50 mcg Oral QAC breakfast Clovis Fredrickson, MD   50 mcg at 06/17/16 0640  . magnesium hydroxide (MILK OF MAGNESIA) suspension 30 mL  30 mL Oral Daily PRN Jolanta B Pucilowska, MD      . nicotine (NICODERM CQ - dosed in mg/24 hours) patch 21 mg  21 mg Transdermal Q0600 Jolanta B Pucilowska, MD      . nitroGLYCERIN (NITROSTAT) SL tablet 0.4 mg  0.4 mg Sublingual Q5 min PRN Jolanta B Pucilowska, MD      . OLANZapine zydis (ZYPREXA) disintegrating tablet 30 mg  30 mg Oral QHS Jolanta B Pucilowska, MD      . pantoprazole (PROTONIX) EC tablet 40 mg  40 mg Oral Daily Jolanta B Pucilowska, MD   40 mg at 06/17/16 0753  . polyethylene glycol (MIRALAX / GLYCOLAX) packet 17 g  17 g Oral Daily Clovis Fredrickson, MD   17 g at 06/16/16 0175  . temazepam (RESTORIL) capsule 15 mg  15 mg Oral QHS PRN Clovis Fredrickson, MD        Lab Results:  Results for orders placed or performed during the hospital encounter of 06/13/16 (from the past 48 hour(s))  Valproic acid level     Status: None   Collection Time: 06/17/16  7:46 AM  Result Value Ref Range   Valproic Acid Lvl 75 50.0 - 100.0 ug/mL    Blood Alcohol level:  Lab Results  Component Value Date   ETH <5 04/27/8526    Metabolic Disorder Labs: Lab Results  Component Value Date   HGBA1C 6.3 (H) 06/10/2016   MPG  134 06/10/2016   No results found for: PROLACTIN Lab Results  Component Value Date   CHOL 156 02/02/2016   TRIG 128 02/02/2016   HDL 49 02/02/2016   CHOLHDL 3.2 02/02/2016   VLDL 26 02/02/2016   LDLCALC 81 02/02/2016   LDLCALC 97 12/18/2014    Physical Findings: AIMS:  , ,  ,  , Dental Status Current problems with teeth and/or dentures?: No Does patient usually wear dentures?: No  CIWA:    COWS:     Musculoskeletal: Strength & Muscle Tone: within normal limits Gait & Station: normal Patient leans: N/A  Psychiatric Specialty Exam: Physical Exam  Nursing note and vitals reviewed.   Review of Systems  Musculoskeletal: Positive for back pain.  Psychiatric/Behavioral: Positive for hallucinations. The patient has insomnia.   All other systems reviewed and are negative.   Blood pressure (!) 158/72, pulse 85, temperature 98.1 F (36.7 C), temperature source Oral, resp. rate 18, height '5\' 2"'  (1.575 m), weight 66.7 kg (147 lb), SpO2 100 %.Body mass index is 26.89 kg/m.  General Appearance: Casual  Eye Contact:  Good  Speech:  Pressured  Volume:  Increased  Mood:  Euphoric  Affect:  Congruent  Thought Process:  Disorganized and Descriptions of Associations: Tangential  Orientation:  Full (Time, Place, and Person)  Thought Content:  Delusions, Hallucinations: Auditory and Paranoid Ideation  Suicidal  Thoughts:  No  Homicidal Thoughts:  No  Memory:  Immediate;   Fair Recent;   Fair Remote;   Fair  Judgement:  Poor  Insight:  Lacking  Psychomotor Activity:  Increased  Concentration:  Concentration: Fair and Attention Span: Fair  Recall:  [employer] of Knowledge:  Fair  Language:  Fair  Akathisia:  No  Handed:  Right  AIMS (if indicated):     Assets:  Communication Skills Desire for Improvement Financial Resources/Insurance Housing Physical Health Resilience Social Support  ADL's:  Intact  Cognition:  WNL  Sleep:  Number of Hours: 4.5     Treatment Plan Summary: Daily contact with patient to assess and evaluate symptoms and progress in treatment and Medication management   [name] Long is a 73-year0old female with a history of schizoaffective disorder admitted in a psychotic episode.  1. Psychosis. She was started on a combination of Depakote and Zyprexa 30 mg for mood stabilization. VPA level 75. I will start haldol 5 mg bid.  2. Hypothyroidism. She is on synthroid.  3. HTN. She is on Norvasc.  4. COPD. She is on inhaler.  5. CAD. Nitroglycerine is available.  6. Smoking. Nicotine patch is available.  7. GERD. She is on protonix.  8. Constipation. She is on bowel regimen.  9. Insomnia. Restoril 30 mg.   10. Metabolic syndrome monitoring. Lipid profile and hemoglobin A1c were performed recently.    11. Disposition. She will be discharged to home with family. She will follow up with [facility].  Orson Slick, MD 06/17/2016, 10:42 AM

## 2016-06-18 NOTE — Progress Notes (Signed)
Patient has been in and out of her room, guarded, paranoid, demanding. Hostile and frequently redirected. Refused Restoril and was somewhat suspicious about her other medications. Currently in bed. No sign of discomfort. Therapeutic milieu promoted and safety precautions maintained.

## 2016-06-18 NOTE — Progress Notes (Signed)
Patient continues to exhibit with being paranoid; delusional; with stating that people were stealing from her. "They go into my locker and my stuff; I want somebody to go and check my stuff; I'm not taking no medicine." Patient did take haldol; depakote; zyprexa 5 mgs; refused zyprexa 25 mgs; and restoril 30 mgs

## 2016-06-18 NOTE — Plan of Care (Signed)
Problem: Safety: Goal: Ability to remain free from injury will improve Outcome: Progressing Denies SI/HI

## 2016-06-18 NOTE — BHH Group Notes (Signed)
[place] Group Notes:  (Nursing/MHT/Case Management/Adjunct)  Date:  06/18/2016  Time:  2:22 AM  Type of Therapy:  Group Therapy  Participation Level:  Did Not Attend  Summary of Progress/Problems:  [name] Long 06/18/2016, 2:22 AM

## 2016-06-18 NOTE — Progress Notes (Signed)
Pt has been confused,delusional and aggressive. Pt needs frequent redirecting. Pt has been somewhat hostile at times. Pt moved to room-6 because of pt in room-1 acting out.

## 2016-06-18 NOTE — BHH Group Notes (Signed)
[facility] LCSW Group Therapy  06/18/2016 2:06 PM  Type of Therapy:  Group Therapy  Participation Level:  Did Not Attend  Summary of Progress/Problems: Coping Skills: Patients defined and discussed healthy coping skills. Patients identified healthy coping skills they would like to try during hospitalization and after discharge. CSW offered insight to varying coping skills that may have been new to patients such as practicing mindfulness.  Amaris G. [place], [place] 06/18/2016, 2:07 PM

## 2016-06-18 NOTE — Plan of Care (Signed)
Problem: Coping: Goal: Ability to cope will improve Outcome: Not Progressing Remains guarded, suspicious, irritable
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
## 2016-06-19 MED ORDER — RISPERIDONE 1 MG PO TABS
1.0000 mg | ORAL_TABLET | Freq: Two times a day (BID) | ORAL | Status: DC
  Administered 2016-06-19: 1 mg via ORAL
  Filled 2016-06-19 (×2): qty 1

## 2016-06-19 NOTE — Progress Notes (Signed)
Pt has been confused,delusional and aggressive. Pt continues to need frequent redirecting. Pt has been somewhat hostile at times. Pt continues to be loud and cursing at times. Pt continues to refuse some of her meds( haldol, colace).
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
## 2016-06-19 NOTE — BHH Group Notes (Signed)
[facility] LCSW Group Therapy  06/19/2016 2:44 PM  Type of Therapy:  Group Therapy  Participation Level:  Patient did not attend group. CSW invited patient to group.   Summary of Progress/Problems:Self care: Patients discussed self care and how it impacts them. Patients were asked to define self care in their own words. They discussed what aspects in their lives has influenced their self care. Patients also discussed self care in the areas of self regulation/control, hygiene/appearance, sleep/relaxation, healthy leisure, healthy eating habits, exercise, inner peace/spirituality, self improvement, sobriety, and health management. They were challenged to identify changes that are needed in order to improve self care.  [name] Long, [place] 06/19/2016, 2:45 PM

## 2016-06-19 NOTE — B[facility] Group Notes (Signed)
[facility] Group Notes:  (Nursing/MHT/Case Management/Adjunct)  Date:  06/19/2016  Time:  5:52 AM  Type of Therapy:  Psychoeducational Skills  Participation Level:  Did Not Attend    Summary of Progress/Problems:  Reece Agar 06/19/2016, 5:52 AM

## 2016-06-20 MED ORDER — DIVALPROEX SODIUM ER 500 MG PO TB24
1000.0000 mg | ORAL_TABLET | Freq: Two times a day (BID) | ORAL | Status: DC
  Administered 2016-06-20 – 2016-06-21 (×2): 1000 mg via ORAL
  Filled 2016-06-20 (×2): qty 2

## 2016-06-20 MED ORDER — RISPERIDONE 1 MG PO TABS
3.0000 mg | ORAL_TABLET | Freq: Two times a day (BID) | ORAL | Status: DC
Start: 2016-06-20 — End: 2016-06-21
  Administered 2016-06-20: 3 mg via ORAL
  Filled 2016-06-20 (×2): qty 3

## 2016-06-20 NOTE — Plan of Care (Signed)
Problem: Coping: Goal: Ability to cope will improve Outcome: Progressing Slow improvement  Noted noted  easily agitated
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
## 2016-06-20 NOTE — Progress Notes (Signed)
D: Patient is alert and oriented to person,place on the unit this shift. Patient not  attended and actively participated in groups today. Patient denies suicidal ideation, homicidal ideation, auditory or visual hallucinations at the present time.  A: Scheduled medications are administered to patient as per MD orders. Emotional support and encouragement are provided. Patient is maintained on q.15 minute safety checks. Patient is informed to notify staff with questions or concerns. R: No adverse medication reactions are noted. Patient is cooperative with some  medication administration . Patient is receptive,  Anxious  and cooperative on the unit at this time. Patient does not  Interact with others on the unit this shift. Patient contracts for safety at this time. Patient remains safe at this time. Anxiety 6/10 Depression 7/10

## 2016-06-20 NOTE — Progress Notes (Signed)
Recreation Therapy Notes  Date: 12.18.17 Time: 9:30 am Location: Craft Room  Group Topic: Self-expression  Goal Area(s) Addresses:  Patient will effectively use art as a means of self-expression. Patient will recognize positive benefit of self-expression. Patient will be able to identify one emotion experienced during group session. Patient will identify use of art as a coping skill.  Behavioral Response: Did not attend  Intervention: Two Faces of Me  Activity: Patients were given blank face worksheets and were instructed to draw a line down the middle. On one side of the face, patients were instructed to draw or write how they felt when they were admitted to the hospital. On the other side of the face, patients were instructed to draw or write how they want to feel when they are d/c.  Education: LRT educated patients on other forms of self-expression.  Education Outcome: Patient did not attend group.   Clinical Observations/Feedback: Patient did not attend group.  Leonette Monarch, LRT/CTRS 06/20/2016 9:59 AM
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
## 2016-06-20 NOTE — Plan of Care (Signed)
Problem: Coping: Goal: Ability to cope will improve Outcome: Not Progressing Patient paranoid and del;usional and unable to use coping skills at this time [facility] RN

## 2016-06-20 NOTE — [facility] Group Notes (Signed)
[place] Group Notes:  (Nursing/MHT/Case Management/Adjunct)  Date:  06/20/2016  Time:  11:24 PM  Type of Therapy:  Group Therapy  Participation Level:  Minimal  Participation Quality:  Resistant  Affect:  Defensive  Cognitive:  Alert  Insight:  Limited  Engagement in Group:  Resistant  Modes of Intervention:  Discussion  Summary of Progress/Problems: Staff attempted to engage pt in conversation about goal. Pt was very short replying with a "no" when asked if she had a goal.  Marylynn Pearson 06/20/2016, 11:24 PM
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
## 2016-06-21 LAB — GLUCOSE, CAPILLARY: Glucose-Capillary: 206 mg/dL — ABNORMAL HIGH (ref 65–99)

## 2016-06-21 MED ORDER — RISPERIDONE 1 MG PO TABS
1.0000 mg | ORAL_TABLET | Freq: Two times a day (BID) | ORAL | Status: DC
  Administered 2016-06-21 – 2016-06-24 (×8): 1 mg via ORAL
  Filled 2016-06-21 (×6): qty 1

## 2016-06-21 MED ORDER — DIVALPROEX SODIUM 500 MG PO DR TAB
500.0000 mg | DELAYED_RELEASE_TABLET | Freq: Three times a day (TID) | ORAL | Status: DC
  Administered 2016-06-21 – 2016-06-24 (×9): 500 mg via ORAL
  Filled 2016-06-21 (×9): qty 1

## 2016-06-21 MED ORDER — LORAZEPAM 0.5 MG PO TABS
0.5000 mg | ORAL_TABLET | Freq: Two times a day (BID) | ORAL | Status: DC
  Administered 2016-06-21 – 2016-06-24 (×6): 0.5 mg via ORAL
  Filled 2016-06-21 (×6): qty 1

## 2016-06-21 NOTE — [facility] Group Notes (Signed)
Goals Group  Date/Time: 06/21/2016  9:00AM Type of Therapy and Topic: Group Therapy: Goals Group: SMART Goals  ?  Participation Level: Moderate  ?  Description of Group:  ?  The purpose of a daily goals group is to assist and guide patients in setting recovery/wellness-related goals. The objective is to set goals as they relate to the crisis in which they were admitted. Patients will be using SMART goal modalities to set measurable goals. Characteristics of realistic goals will be discussed and patients will be assisted in setting and processing how one will reach their goal. Facilitator will also assist patients in applying interventions and coping skills learned in psycho-education groups to the SMART goal and process how one will achieve defined goal.  ?  Therapeutic Goals:  ?  -Patients will develop and document one goal related to or their crisis in which brought them into treatment.  -Patients will be guided by LCSW using SMART goal setting modality in how to set a measurable, attainable, realistic and time sensitive goal.  -Patients will process barriers in reaching goal.  -Patients will process interventions in how to overcome and successful in reaching goal.  ?  Patient's Goal: Patient goal for today is to be thankful and show gratitude to everyone. Pt stated to accomplish this goal, she will remain positive.  ?  Therapeutic Modalities:  Motivational Interviewing  Cognitive Behavioral Therapy  Crisis Intervention Model  SMART goals setting   Glorious Peach, MSW, LCSW-A 06/21/2016, 10:28AM

## 2016-06-21 NOTE — Progress Notes (Signed)
Patient only took 500mg  of her depakote. She had previously refused resperdal from day shift and wanted to take it with HS medications but still would only take 1 mg.

## 2016-06-21 NOTE — Plan of Care (Signed)
Problem: Skin Integrity: Goal: Risk for impaired skin integrity will decrease Outcome: Progressing Patient remains active on the unit.

## 2016-06-21 NOTE — Tx Team (Signed)
Interdisciplinary Treatment and Diagnostic Plan Update  06/21/2016 Time of Session: 10:30am [name] Long MRN: [id number]  Principal Diagnosis: Schizoaffective disorder, bipolar type ([place])  Secondary Diagnoses: Principal Problem:   Schizoaffective disorder, bipolar type ([place]) Active Problems:   Hypothyroidism   Type 2 diabetes, diet controlled ([place])   Tobacco use disorder   COPD (chronic obstructive pulmonary disease) ([place])   Major depressive disorder, recurrent severe without psychotic features ([place])   Current Medications:  Current Facility-Administered Medications  Medication Dose Route Frequency Provider Last Rate Last Dose  . acetaminophen (TYLENOL) tablet 650 mg  650 mg Oral Q6H PRN Clovis Fredrickson, MD   650 mg at 06/21/16 0058  . albuterol (PROVENTIL HFA;VENTOLIN HFA) 108 (90 Base) MCG/ACT inhaler 1-2 puff  1-2 puff Inhalation Q6H PRN Clovis Fredrickson, MD   2 puff at 06/17/16 0641  . alum & mag hydroxide-simeth (MAALOX/MYLANTA) 200-200-20 MG/5ML suspension 30 mL  30 mL Oral Q4H PRN Jolanta B Pucilowska, MD      . amLODipine (NORVASC) tablet 5 mg  5 mg Oral Daily Jolanta B Pucilowska, MD   5 mg at 06/21/16 0848  . divalproex (DEPAKOTE ER) 24 hr tablet 1,000 mg  1,000 mg Oral BID AC & HS Jolanta B Pucilowska, MD   1,000 mg at 06/21/16 0601  . docusate sodium (COLACE) capsule 100 mg  100 mg Oral BID Jolanta B Pucilowska, MD      . levothyroxine (SYNTHROID, LEVOTHROID) tablet 50 mcg  50 mcg Oral QAC breakfast Clovis Fredrickson, MD   50 mcg at 06/21/16 0557  . magnesium hydroxide (MILK OF MAGNESIA) suspension 30 mL  30 mL Oral Daily PRN Jolanta B Pucilowska, MD      . nicotine (NICODERM CQ - dosed in mg/24 hours) patch 21 mg  21 mg Transdermal Q0600 Clovis Fredrickson, MD   21 mg at 06/18/16 [id number]  . nitroGLYCERIN (NITROSTAT) SL tablet 0.4 mg  0.4 mg Sublingual Q5 min PRN Jolanta B Pucilowska, MD      . pantoprazole (PROTONIX) EC tablet 40 mg  40 mg Oral Daily Jolanta  B Pucilowska, MD   40 mg at 06/21/16 0849  . polyethylene glycol (MIRALAX / GLYCOLAX) packet 17 g  17 g Oral Daily Clovis Fredrickson, MD   17 g at 06/16/16 [id number]  . risperiDONE (RISPERDAL) tablet 3 mg  3 mg Oral BID Clovis Fredrickson, MD   3 mg at 06/20/16 2209  . temazepam (RESTORIL) capsule 30 mg  30 mg Oral QHS Jolanta B Pucilowska, MD   30 mg at 06/19/16 2242   PTA Medications: Prescriptions Prior to Admission  Medication Sig Dispense Refill Last Dose  . albuterol (PROVENTIL HFA) 108 (90 Base) MCG/ACT inhaler Inhale 2 puffs into the lungs every 4 (four) hours as needed. For wheezing 1 Inhaler 5 Unknown at Unknown  . amLODipine (NORVASC) 5 MG tablet Take 1 tablet (5 mg total) by mouth daily. 90 tablet 3 On file  . aspirin ([employer] ASPIRIN) 81 MG chewable tablet Chew 81 mg by mouth daily.     Unknown at Unknown  . beclomethasone (QVAR) 80 MCG/ACT inhaler Inhale 1 puff into the lungs 2 (two) times daily. 8.7 g 12 Unknown at Unknown  . divalproex (DEPAKOTE ER) 500 MG 24 hr tablet Take 1 tablet (500 mg total) by mouth 2 (two) times daily. 60 tablet 0   . docusate sodium (COLACE) 100 MG capsule Take 1 capsule (100 mg total) by mouth every 12 (twelve)  hours. 60 capsule 0 Unknown at Unknown  . fluticasone (FLONASE) 50 MCG/ACT nasal spray Place 2 sprays into both nostrils daily. (Patient not taking: Reported on 06/10/2016) 16 g 1 Completed Course at Unknown time  . glucose blood (ACCU-CHEK AVIVA PLUS) test strip Use as instructed (Patient not taking: Reported on 06/10/2016) 25 each 12 Not Taking at Unknown time  . ibuprofen (ADVIL,MOTRIN) 800 MG tablet Take 1 tablet (800 mg total) by mouth 3 (three) times daily. 21 tablet 0 Unknown at Unknown  . Lancets (ACCU-CHEK MULTICLIX) lancets TEST BLOOD SUGAR 1 TO 3 TIMES PER WEEK (Patient not taking: Reported on 06/10/2016) 102 each 3 Not Taking at Unknown time  . levothyroxine (SYNTHROID) 50 MCG tablet Take 1 tablet (50 mcg total) by mouth daily  before breakfast. 90 tablet 3 Unknown at Unknown  . nitroGLYCERIN (NITROSTAT) 0.4 MG SL tablet Place 1 tablet (0.4 mg total) under the tongue every 5 (five) minutes as needed for chest pain. 50 tablet 3 Unknown at Unknown  . OLANZapine zydis (ZYPREXA) 10 MG disintegrating tablet Take 1 tablet (10 mg total) by mouth at bedtime. 30 tablet 0   . olopatadine (PATANOL) 0.1 % ophthalmic solution Place 1 drop into both eyes 2 (two) times daily. 5 mL 2 Unknown at Unknown  . omeprazole (PRILOSEC) 40 MG capsule Take 1 capsule (40 mg total) by mouth daily. 30 capsule 11 Unknown at Unknown  . polyethylene glycol (MIRALAX) packet Take 17 g by mouth daily. 14 each 0 Unknown at Unknown  . pravastatin (PRAVACHOL) 40 MG tablet take 1 tablet by mouth every evening (Patient taking differently: Take 40 mg by mouth every evening. ) 30 tablet 11 Unknown at Unknown  . risperiDONE (RISPERDAL M-TABS) 0.5 MG disintegrating tablet Take 1 tablet (0.5 mg total) by mouth 2 (two) times daily. 60 tablet 0     Patient Stressors: Medication change or noncompliance  Patient Strengths: General fund of knowledge Supportive family/friends  Treatment Modalities: Medication Management, Group therapy, Case management,  1 to 1 session with clinician, Psychoeducation, Recreational therapy.   Physician Treatment Plan for Primary Diagnosis: Schizoaffective disorder, bipolar type ([place]) Long Term Goal(s): Improvement in symptoms so as ready for discharge NA   Short Term Goals: Ability to identify changes in lifestyle to reduce recurrence of condition will improve Ability to verbalize feelings will improve Ability to disclose and discuss suicidal ideas Ability to demonstrate self-control will improve Ability to identify and develop effective coping behaviors will improve Ability to maintain clinical measurements within normal limits will improve Compliance with prescribed medications will improve NA  Medication Management: Evaluate  patient's response, side effects, and tolerance of medication regimen.  Therapeutic Interventions: 1 to 1 sessions, Unit Group sessions and Medication administration.  Evaluation of Outcomes: Progressing  Physician Treatment Plan for Secondary Diagnosis: Principal Problem:   Schizoaffective disorder, bipolar type ([place]) Active Problems:   Hypothyroidism   Type 2 diabetes, diet controlled ([place])   Tobacco use disorder   COPD (chronic obstructive pulmonary disease) ([facility])   Major depressive disorder, recurrent severe without psychotic features ([place])  Long Term Goal(s): Improvement in symptoms so as ready for discharge NA   Short Term Goals: Ability to identify changes in lifestyle to reduce recurrence of condition will improve Ability to verbalize feelings will improve Ability to disclose and discuss suicidal ideas Ability to demonstrate self-control will improve Ability to identify and develop effective coping behaviors will improve Ability to maintain clinical measurements within normal limits will improve Compliance with prescribed  medications will improve NA     Medication Management: Evaluate patient's response, side effects, and tolerance of medication regimen.  Therapeutic Interventions: 1 to 1 sessions, Unit Group sessions and Medication administration.  Evaluation of Outcomes: Progressing   RN Treatment Plan for Primary Diagnosis: Schizoaffective disorder, bipolar type ([place]) Long Term Goal(s): Knowledge of disease and therapeutic regimen to maintain health will improve  Short Term Goals: Ability to verbalize frustration and anger appropriately will improve, Ability to demonstrate self-control, Ability to participate in decision making will improve, Ability to identify and develop effective coping behaviors will improve and Compliance with prescribed medications will improve  Medication Management: RN will administer medications as ordered by provider, will assess and  evaluate patient's response and provide education to patient for prescribed medication. RN will report any adverse and/or side effects to prescribing provider.  Therapeutic Interventions: 1 on 1 counseling sessions, Psychoeducation, Medication administration, Evaluate responses to treatment, Monitor vital signs and CBGs as ordered, Perform/monitor CIWA, COWS, AIMS and Fall Risk screenings as ordered, Perform wound care treatments as ordered.  Evaluation of Outcomes: Progressing   LCSW Treatment Plan for Primary Diagnosis: Schizoaffective disorder, bipolar type ([place]) Long Term Goal(s): Safe transition to appropriate next level of care at discharge, Engage patient in therapeutic group addressing interpersonal concerns.  Short Term Goals: Engage patient in aftercare planning with referrals and resources, Increase social support, Increase ability to appropriately verbalize feelings and Increase skills for wellness and recovery  Therapeutic Interventions: Assess for all discharge needs, 1 to 1 time with Social worker, Explore available resources and support systems, Assess for adequacy in community support network, Educate family and significant other(s) on suicide prevention, Complete Psychosocial Assessment, Interpersonal group therapy.  Evaluation of Outcomes: Progressing   Progress in Treatment: Attending groups: No. Participating in groups: No. Taking medication as prescribed: No. Toleration medication: No. Family/Significant other contact made: Yes, individual(s) contacted:  daughter Patient understands diagnosis: No. Discussing patient identified problems/goals with staff: No. Medical problems stabilized or resolved: Yes. Denies suicidal/homicidal ideation: Yes. Issues/concerns per patient self-inventory: No. Other: n/a  New problem(s) identified: None identified at this time.   New Short Term/Long Term Goal(s): None identified at this time.   Discharge Plan or Barriers: Patient  will discharge home and follow-up with [facility].  Reason for Continuation of Hospitalization: Depression Medication stabilization  Estimated Length of Stay: 7 days.  Attendees: Patient:[name] Long 06/21/2016 11:12 AM  Physician: Dr. Bary Leriche, MD 06/21/2016 11:12 AM  Nursing: Polly Cobia, RN 06/21/2016 11:12 AM  RN Care Manager: 06/21/2016 11:12 AM  Social Worker: Glorious Peach, MSW, [place] 06/21/2016 11:12 AM  Recreational Therapist:  06/21/2016 11:12 AM  Other:  06/21/2016 11:12 AM  Other:  06/21/2016 11:12 AM  Other: 06/21/2016 11:12 AM    Scribe for Treatment Team: Emilie Rutter, [place] 06/21/2016 11:12 AM

## 2016-06-21 NOTE — Progress Notes (Signed)
D: Patient had just gotten off phone . Noted to have hung up abruptly . [place] she has had a stroke 2 days ago  Writer noted gait normal , no difference in speech , no distortion with facial  Features.  Writer notified  MD of finding . Jerilee Hoh.  Vital Signs 138/73 121 20 .
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
## 2016-06-21 NOTE — Progress Notes (Signed)
D:Patient  Noted to be more labile today with staff. Call staff names" Fat  ass bitch ", " Silver wooley head with  A wig on ". Patient voice of not taking her medication . Stating that's not what she is on .  Isolated  From her peers  In room  .  No auditory hallucinations  No pain concerns . Appropriate ADL'S.  Appetite good , voice no concerns around sleep. Very angry affect today. A: Encourage patient participation with unit programming . Instruction  Given on  Medication , verbalize understanding. R: Voice no other concerns. Staff continue to monitor

## 2016-06-21 NOTE — Progress Notes (Signed)
Recreation Therapy Notes  Date: 12.19.17 Time: 9:30 am Location: Craft Room  Group Topic: Goal Setting  Goal Area(s) Addresses:  Patient will identify a goal. Patient will identify supportive statements to help achieve goal.  Behavioral Response: Did not attend  Intervention: Step By Step  Activity: Patients were given a worksheet with the outline of a foot on it and were instructed to write their goal inside the foot and write supportive statements outside the foot.  Education: LRT educated patients on ways to celebrate reaching their goals.  Education Outcome: Patient did not attend group.   Clinical Observations/Feedback: Patient did not attend group.  Leonette [facility], LRT/CTRS 06/21/2016 10:06 AM

## 2016-06-22 LAB — GLUCOSE, CAPILLARY: Glucose-Capillary: 150 mg/dL — ABNORMAL HIGH (ref 65–99)

## 2016-06-22 NOTE — [facility] Group Notes (Signed)
[place] Group Notes:  (Nursing/MHT/Case Management/Adjunct)  Date:  06/22/2016  Time:  4:35 PM  Type of Therapy:  Psychoeducational Skills  Participation Level:  Did Not Attend  Charise Killian 06/22/2016, 4:35 PM

## 2016-06-22 NOTE — Progress Notes (Signed)
D:  Patient approached  Writer this am shift  Voiced when was she going to  The ER. ?Stated she has a broken  Arm .  Informed  Patient  She does not have a broken arm and she will not be going to the ER . Patient stated to writer " Your are not a  Orthopedist. MD intervened  Instructed  Patient informed  she wasn't going to the ER.  Patient has been  compliant  With medications . Given this shift .Limited interaction with peers and staff. Patient stated slept fair last night .Stated appetite is good and energy level  Is normal. Stated concentration is good .Denies depression  Denies suicidal  homicidal ideations  .  No auditory hallucinations  No pain concerns . Appropriate ADL'S. .  A: Encourage patient participation with unit programming . Instruction  Given on  Medication , verbalize understanding. R: Voice no other concerns. Staff continue to monitor

## 2016-06-22 NOTE — Progress Notes (Signed)
[facility] MD Progress Note  06/22/2016 2:28 PM [name] Long  MRN:  [id number]  Subjective:  [name] Long is a 65 year old female with history of bipolar disorder admitted in a manic dysphoric episode in the context of treatment noncompliance. She was restarted on a combination of Zyprexa and Depakote but has been frequently refusing medications and takes them only with much encouragement.   [name] Long seems more confused today. She insists that her arm is broken and she needs go to the emergency room. She complains of not having eaten lunch when she finished it 10 minutes ago. She has been banging on my door constantly. She took a lower dose of her medicines by indeed, she has been an Press photographer. She still does not believe that she is being evicted from her apartment at the end of the month. I spoke with her daughter [name] Long Was Very Much like Her Mother to Be Discharged Home on Friday for the Holidays. She Believes That She Can Handle Her Mother.    Per nursing: D: Patient still agitated at time. States she's in pain but doesn't want to take anything for it. Denies SI/HI/AVH. Visible in the milieu. States she told the doctor about her pain and the doctor just laughed in her face. A: Medication given with education. Encouragement provided.  R: Patient was compliant with medication. She has remained calm and cooperative. Safety maintained with 15 min checks.   Principal Problem: Schizoaffective disorder, bipolar type ([place]) Diagnosis:   Patient Active Problem List   Diagnosis Date Noted  . Major depressive disorder, recurrent severe without psychotic features ([place]) [F33.2] 06/13/2016  . Schizoaffective disorder, bipolar type ([place]) [F25.0]   . URI (upper respiratory infection) [J06.9] 03/09/2016  . Muscle strain of chest wall [S29.011A] 10/21/2015  . Activities of daily living deficit involving total body bathing [IMO0001] 08/10/2015  . Vitamin D deficiency [E55.9] 12/19/2014  .  Preventative health care [Z00.00] 03/21/2014  . Bronchitis, chronic obstructive w acute bronchitis ([place]) [J44.0] 01/07/2014  . Osteoarthritis, multiple sites [M15.9] 06/08/2011  . Vaginal discharge [N89.8] 01/14/2011  . COPD (chronic obstructive pulmonary disease) ([place]) [J44.9] 09/23/2010  . CHRONIC KIDNEY DISEASE STAGE II (MILD) [N18.2] 09/14/2009  . Hypothyroidism [E03.9] 08/31/2006  . Type 2 diabetes, diet controlled ([place]) [E11.9] 08/31/2006  . HYPERCHOLESTEROLEMIA [E78.00] 08/31/2006  . Tobacco use disorder [F17.200] 08/31/2006  . HYPERTENSION, BENIGN SYSTEMIC [I10] 08/31/2006   Total Time spent with patient: 20 minutes  Past Psychiatric History: Bipolar disorder.  Past Medical History:  Past Medical History:  Diagnosis Date  . BIPOLAR DISORDER 08/31/2006   Qualifier: Diagnosis of  By: Dorathy Daft MD, Marjory Lies    . CHRONIC KIDNEY DISEASE STAGE II (MILD) 09/14/2009   Annotation: eGFR 64 Qualifier: Diagnosis of  By: Jess Barters MD, [name]    . COPD (chronic obstructive pulmonary disease) ([place]) 09/23/2010   Diagnosed at [facility] in 2008 (Dr. Annamaria Boots)   . DM (diabetes mellitus) type II controlled with renal manifestation ([place]) 08/31/2006   Qualifier: Diagnosis of  By: Dorathy Daft MD, Marjory Lies    . HYPERCHOLESTEROLEMIA 08/31/2006   Intolerance to Lipitor OK on Crestor but medicaid no longer covering    . HYPERTENSION, BENIGN SYSTEMIC 08/31/2006   Qualifier: Diagnosis of  By: Dorathy Daft MD, Marjory Lies    . HYPOTHYROIDISM, UNSPECIFIED 08/31/2006   Qualifier: Diagnosis of  By: Dorathy Daft MD, Marjory Lies      Past Surgical History:  Procedure Laterality Date  . CESAREAN SECTION    . FOOT SURGERY    . KNEE  SURGERY Bilateral    Family History: History reviewed. No pertinent family history. Family Psychiatric  History: See H&P. Social History:  History  Alcohol Use  . Yes     History  Drug Use No    Social History   Social History  . Marital status: Divorced    Spouse name: N/A  . Number of children: N/A   . Years of education: N/A   Social History Main Topics  . Smoking status: Current Every Day Smoker    Packs/day: 0.50    Years: 39.00    Types: Cigarettes  . Smokeless tobacco: Never Used  . Alcohol use Yes  . Drug use: No  . Sexual activity: Not Asked   Other Topics Concern  . None   Social History Narrative  . None   Additional Social History:                         Sleep: Poor  Appetite:  Fair  Current Medications: Current Facility-Administered Medications  Medication Dose Route Frequency Provider Last Rate Last Dose  . acetaminophen (TYLENOL) tablet 650 mg  650 mg Oral Q6H PRN Clovis Fredrickson, MD   650 mg at 06/22/16 0616  . albuterol (PROVENTIL HFA;VENTOLIN HFA) 108 (90 Base) MCG/ACT inhaler 1-2 puff  1-2 puff Inhalation Q6H PRN Clovis Fredrickson, MD   2 puff at 06/17/16 0641  . alum & mag hydroxide-simeth (MAALOX/MYLANTA) 200-200-20 MG/5ML suspension 30 mL  30 mL Oral Q4H PRN Jolanta B Pucilowska, MD      . amLODipine (NORVASC) tablet 5 mg  5 mg Oral Daily Jolanta B Pucilowska, MD   5 mg at 06/22/16 0901  . divalproex (DEPAKOTE) DR tablet 500 mg  500 mg Oral Q8H Jolanta B Pucilowska, MD   500 mg at 06/22/16 1428  . docusate sodium (COLACE) capsule 100 mg  100 mg Oral BID Jolanta B Pucilowska, MD      . levothyroxine (SYNTHROID, LEVOTHROID) tablet 50 mcg  50 mcg Oral QAC breakfast Clovis Fredrickson, MD   50 mcg at 06/22/16 0608  . LORazepam (ATIVAN) tablet 0.5 mg  0.5 mg Oral BID Clovis Fredrickson, MD   0.5 mg at 06/22/16 0901  . magnesium hydroxide (MILK OF MAGNESIA) suspension 30 mL  30 mL Oral Daily PRN Jolanta B Pucilowska, MD      . nicotine (NICODERM CQ - dosed in mg/24 hours) patch 21 mg  21 mg Transdermal Q0600 Clovis Fredrickson, MD   21 mg at 06/18/16 3254  . nitroGLYCERIN (NITROSTAT) SL tablet 0.4 mg  0.4 mg Sublingual Q5 min PRN Jolanta B Pucilowska, MD      . pantoprazole (PROTONIX) EC tablet 40 mg  40 mg Oral Daily Jolanta B  Pucilowska, MD   40 mg at 06/22/16 0901  . polyethylene glycol (MIRALAX / GLYCOLAX) packet 17 g  17 g Oral Daily Clovis Fredrickson, MD   17 g at 06/16/16 9826  . risperiDONE (RISPERDAL) tablet 1 mg  1 mg Oral BID Clovis Fredrickson, MD   1 mg at 06/22/16 0901  . temazepam (RESTORIL) capsule 30 mg  30 mg Oral QHS Jolanta B Pucilowska, MD   30 mg at 06/19/16 2242    Lab Results:  Results for orders placed or performed during the hospital encounter of 06/13/16 (from the past 48 hour(s))  Glucose, capillary     Status: Abnormal   Collection Time: 06/21/16  8:13 PM  Result  Value Ref Range   Glucose-Capillary 206 (H) 65 - 99 mg/dL    Blood Alcohol level:  Lab Results  Component Value Date   ETH <5 76/19/5093    Metabolic Disorder Labs: Lab Results  Component Value Date   HGBA1C 6.3 (H) 06/10/2016   MPG 134 06/10/2016   No results found for: PROLACTIN Lab Results  Component Value Date   CHOL 156 02/02/2016   TRIG 128 02/02/2016   HDL 49 02/02/2016   CHOLHDL 3.2 02/02/2016   VLDL 26 02/02/2016   LDLCALC 81 02/02/2016   LDLCALC 97 12/18/2014    Physical Findings: AIMS:  , ,  ,  , Dental Status Current problems with teeth and/or dentures?: No Does patient usually wear dentures?: No  CIWA:    COWS:     Musculoskeletal: Strength & Muscle Tone: within normal limits Gait & Station: normal Patient leans: N/A  Psychiatric Specialty Exam: Physical Exam  Nursing note and vitals reviewed.   Review of Systems  Musculoskeletal: Positive for back pain.  Psychiatric/Behavioral: Positive for hallucinations. The patient has insomnia.   All other systems reviewed and are negative.   Blood pressure 103/72, pulse 99, temperature 98.3 F (36.8 C), resp. rate 20, height '5\' 2"'  (1.575 m), weight 65.8 kg (145 lb), SpO2 100 %.Body mass index is 26.52 kg/m.  General Appearance: Casual  Eye Contact:  Good  Speech:  Pressured  Volume:  Increased  Mood:  Euphoric  Affect:  Congruent   Thought Process:  Disorganized and Descriptions of Associations: Tangential  Orientation:  Full (Time, Place, and Person)  Thought Content:  Delusions, Hallucinations: Auditory and Paranoid Ideation  Suicidal Thoughts:  No  Homicidal Thoughts:  No  Memory:  Immediate;   Fair Recent;   Fair Remote;   Fair  Judgement:  Poor  Insight:  Lacking  Psychomotor Activity:  Increased  Concentration:  Concentration: Fair and Attention Span: Fair  Recall:  [employer] of Knowledge:  Fair  Language:  Fair  Akathisia:  No  Handed:  Right  AIMS (if indicated):     Assets:  Communication Skills Desire for Improvement Financial Resources/Insurance Housing Physical Health Resilience Social Support  ADL's:  Intact  Cognition:  WNL  Sleep:  Number of Hours: 4.25     Treatment Plan Summary: Daily contact with patient to assess and evaluate symptoms and progress in treatment and Medication management   [name] Long is a 38-year0old female with a history of schizoaffective disorder admitted in a psychotic episode.  1. Psychosis. She was started on a combination of Depakote and Zyprexa 30 mg for mood stabilization. VPA level 75. Haldol was added sue to slow response. She refused Haldol and Zyprexa on Friday and was started on Risperdal. She only wants Depakote ER. I will switch 1500 mg of Depakote to 2000 mg of Depakote ER.   2. Hypothyroidism. She is on synthroid.  3. HTN. She is on Norvasc.  4. COPD. She is on inhaler.  5. CAD. Nitroglycerine is available.  6. Smoking. Nicotine patch is available.  7. GERD. She is on protonix.  8. Constipation. She is on bowel regimen.  9. Insomnia. Restoril 30 mg.   10. Metabolic syndrome monitoring. Lipid profile and hemoglobin A1c were performed recently.    11. Disposition. She will be discharged to home with family. She will follow up with [facility].   Orson Slick, MD 06/22/2016, 2:28 PM

## 2016-06-22 NOTE — Progress Notes (Signed)
D: Patient still agitated at time. States she's in pain but doesn't want to take anything for it. Denies SI/HI/AVH. Visible in the milieu. States she told the doctor about her pain and the doctor just laughed in her face. A: Medication given with education. Encouragement provided.  R: Patient was compliant with medication. She has remained calm and cooperative. Safety maintained with 15 min checks.

## 2016-06-22 NOTE — Plan of Care (Signed)
Problem: Health Behavior/Discharge Planning: Goal: Compliance with prescribed medication regimen will improve Outcome: Progressing Patient was compliant with medication at HS

## 2016-06-22 NOTE — Progress Notes (Signed)
Recreation Therapy Notes  Date: 12.20.17 Time: 9:30 am Location: Craft Room  Group Topic: Self-esteem  Goal Area(s) Addresses:  Patient will write at least one positive trait. Patient will verbalize benefit of having healthy self-esteem.  Behavioral Response: Did not attend  Intervention: I Am  Activity: Patients were given a worksheet with the letter I on it and were instructed to write as many positive traits inside the letter.  Education: LRT educated patients on ways they can increase their self-esteem.  Education Outcome: Patient did not attend group.  Clinical Observations/Feedback: Patient did not attend group.  Leonette Monarch, LRT/CTRS 06/22/2016 10:02 AM

## 2016-06-22 NOTE — Plan of Care (Signed)
Problem: Coping: Goal: Ability to cope will improve Outcome: Progressing Working on coping skills , hand -out  Given

## 2016-06-22 NOTE — Progress Notes (Signed)
Patient c/o pain in wrist stating that she fell a few months ago. MD on call notified. Tylenol administered.

## 2016-06-23 MED ORDER — RISPERIDONE 1 MG PO TABS: 1.0000 mg | ORAL_TABLET | Freq: Two times a day (BID) | ORAL | 1 refills | Status: DC

## 2016-06-23 MED ORDER — LORAZEPAM 0.5 MG PO TABS: 0.5000 mg | ORAL_TABLET | Freq: Two times a day (BID) | ORAL | 0 refills | Status: DC

## 2016-06-23 MED ORDER — TEMAZEPAM 30 MG PO CAPS: 30.0000 mg | ORAL_CAPSULE | Freq: Every day | ORAL | 0 refills | Status: DC

## 2016-06-23 MED ORDER — DIVALPROEX SODIUM 500 MG PO DR TAB: 500.0000 mg | DELAYED_RELEASE_TABLET | Freq: Three times a day (TID) | ORAL | 1 refills | Status: DC

## 2016-06-23 NOTE — BHH Group Notes (Addendum)
[place] LCSW Group Therapy Note  Date/Time: 06/23/16 1300  Type of Therapy/Topic:  Group Therapy:  Balance in Life  Participation Level:  Appropriate  Description of Group:    This group will address the concept of balance and how it feels and looks when one is unbalanced. Patients will be encouraged to process areas in their lives that are out of balance, and identify reasons for remaining unbalanced. Facilitators will guide patients utilizing problem- solving interventions to address and correct the stressor making their life unbalanced. Understanding and applying boundaries will be explored and addressed for obtaining  and maintaining a balanced life. Patients will be encouraged to explore ways to assertively make their unbalanced needs known to significant others in their lives, using other group members and facilitator for support and feedback.  Therapeutic Goals: 1. Patient will identify two or more emotions or situations they have that consume much of in their lives. 2. Patient will identify signs/triggers that life has become out of balance:  3. Patient will identify two ways to set boundaries in order to achieve balance in their lives:  4. Patient will demonstrate ability to communicate their needs through discussion and/or role plays  Summary of Patient Progress: Pt did attend group and did participate.  Pt identified personal relationships and intellectual as areas in her life that are out of balance.  Pt contributed a number of comments to the discussion but did not appear to fully comprehend the concepts.     Therapeutic Modalities:   Cognitive Behavioral Therapy Solution-Focused Therapy Assertiveness Training  Lurline Idol, [place]

## 2016-06-23 NOTE — Progress Notes (Signed)
[facility] MD Progress Note  06/23/2016 2:25 PM [name] Long  MRN:  [id number]  Subjective:  [name] Long is a 65 year old female with history of bipolar disorder admitted in a manic dysphoric episode in the context of treatment noncompliance. She was restarted on a combination of Zyprexa and Depakote but has been frequently refusing medications and takes them only with much encouragement.   [name] Long met with the treatment team today. She is more playful and pleasant boss talks complete nonsense. She seems to be taking medications with left encouragement. Her family insisted on having her home for Christmas. The patient is not suicidal or homicidal and the family is well used to her delusions. Social worker will call her daughter [name] Long to tell the treatment team does not believe the patient is ready for discharge but we will allow the family to decide. She still complains of arm pain and believes that it's broken. She reports good sleep and appetite. There are no side effects from medications.   Per nursing: Observed mostly on the phone, and pacing the hall, refused to attend group, delusional, "[name] Long to date his son, I told her I don't date white boys......" Denied pain, refused Temazepam 30 mg = 2 Capsules, "If you give Long the white pill, Risperidone, I will take it>" She only took depakote DR 500 mg. She has been up 3 times to check the clock and returned to her room.  Principal Problem: Schizoaffective disorder, bipolar type ([place]) Diagnosis:   Patient Active Problem List   Diagnosis Date Noted  . Major depressive disorder, recurrent severe without psychotic features ([place]) [F33.2] 06/13/2016  . Schizoaffective disorder, bipolar type ([place]) [F25.0]   . URI (upper respiratory infection) [J06.9] 03/09/2016  . Muscle strain of chest wall [S29.011A] 10/21/2015  . Activities of daily living deficit involving total body bathing [IMO0001] 08/10/2015  . Vitamin D deficiency [E55.9]  12/19/2014  . Preventative health care [Z00.00] 03/21/2014  . Bronchitis, chronic obstructive w acute bronchitis ([place]) [J44.0] 01/07/2014  . Osteoarthritis, multiple sites [M15.9] 06/08/2011  . Vaginal discharge [N89.8] 01/14/2011  . COPD (chronic obstructive pulmonary disease) ([place]) [J44.9] 09/23/2010  . CHRONIC KIDNEY DISEASE STAGE II (MILD) [N18.2] 09/14/2009  . Hypothyroidism [E03.9] 08/31/2006  . Type 2 diabetes, diet controlled ([place]) [E11.9] 08/31/2006  . HYPERCHOLESTEROLEMIA [E78.00] 08/31/2006  . Tobacco use disorder [F17.200] 08/31/2006  . HYPERTENSION, BENIGN SYSTEMIC [I10] 08/31/2006   Total Time spent with patient: 20 minutes  Past Psychiatric History: Bipolar disorder.  Past Medical History:  Past Medical History:  Diagnosis Date  . BIPOLAR DISORDER 08/31/2006   Qualifier: Diagnosis of  By: Dorathy Daft MD, Marjory Lies    . CHRONIC KIDNEY DISEASE STAGE II (MILD) 09/14/2009   Annotation: eGFR 64 Qualifier: Diagnosis of  By: Jess Barters MD, [name]    . COPD (chronic obstructive pulmonary disease) ([place]) 09/23/2010   Diagnosed at [facility] in 2008 (Dr. Annamaria Boots)   . DM (diabetes mellitus) type II controlled with renal manifestation ([place]) 08/31/2006   Qualifier: Diagnosis of  By: Dorathy Daft MD, Marjory Lies    . HYPERCHOLESTEROLEMIA 08/31/2006   Intolerance to Lipitor OK on Crestor but medicaid no longer covering    . HYPERTENSION, BENIGN SYSTEMIC 08/31/2006   Qualifier: Diagnosis of  By: Dorathy Daft MD, Marjory Lies    . HYPOTHYROIDISM, UNSPECIFIED 08/31/2006   Qualifier: Diagnosis of  By: Dorathy Daft MD, Marjory Lies      Past Surgical History:  Procedure Laterality Date  . CESAREAN SECTION    . FOOT SURGERY    .  KNEE SURGERY Bilateral    Family History: History reviewed. No pertinent family history. Family Psychiatric  History: See H&P. Social History:  History  Alcohol Use  . Yes     History  Drug Use No    Social History   Social History  . Marital status: Divorced    Spouse name: N/A  . Number of  children: N/A  . Years of education: N/A   Social History Main Topics  . Smoking status: Current Every Day Smoker    Packs/day: 0.50    Years: 39.00    Types: Cigarettes  . Smokeless tobacco: Never Used  . Alcohol use Yes  . Drug use: No  . Sexual activity: Not Asked   Other Topics Concern  . None   Social History Narrative  . None   Additional Social History:                         Sleep: Poor  Appetite:  Fair  Current Medications: Current Facility-Administered Medications  Medication Dose Route Frequency Provider Last Rate Last Dose  . acetaminophen (TYLENOL) tablet 650 mg  650 mg Oral Q6H PRN Clovis Fredrickson, MD   650 mg at 06/23/16 0627  . albuterol (PROVENTIL HFA;VENTOLIN HFA) 108 (90 Base) MCG/ACT inhaler 1-2 puff  1-2 puff Inhalation Q6H PRN Clovis Fredrickson, MD   2 puff at 06/17/16 0641  . alum & mag hydroxide-simeth (MAALOX/MYLANTA) 200-200-20 MG/5ML suspension 30 mL  30 mL Oral Q4H PRN Jolanta B Pucilowska, MD      . amLODipine (NORVASC) tablet 5 mg  5 mg Oral Daily Clovis Fredrickson, MD   5 mg at 06/23/16 0807  . divalproex (DEPAKOTE) DR tablet 500 mg  500 mg Oral Q8H Jolanta B Pucilowska, MD   500 mg at 06/23/16 1226  . docusate sodium (COLACE) capsule 100 mg  100 mg Oral BID Clovis Fredrickson, MD   100 mg at 06/23/16 0808  . levothyroxine (SYNTHROID, LEVOTHROID) tablet 50 mcg  50 mcg Oral QAC breakfast Clovis Fredrickson, MD   50 mcg at 06/23/16 5465  . LORazepam (ATIVAN) tablet 0.5 mg  0.5 mg Oral BID Clovis Fredrickson, MD   0.5 mg at 06/23/16 0808  . magnesium hydroxide (MILK OF MAGNESIA) suspension 30 mL  30 mL Oral Daily PRN Jolanta B Pucilowska, MD      . nicotine (NICODERM CQ - dosed in mg/24 hours) patch 21 mg  21 mg Transdermal Q0600 Clovis Fredrickson, MD   21 mg at 06/18/16 0354  . nitroGLYCERIN (NITROSTAT) SL tablet 0.4 mg  0.4 mg Sublingual Q5 min PRN Jolanta B Pucilowska, MD      . pantoprazole (PROTONIX) EC tablet 40  mg  40 mg Oral Daily Clovis Fredrickson, MD   40 mg at 06/23/16 0807  . polyethylene glycol (MIRALAX / GLYCOLAX) packet 17 g  17 g Oral Daily Clovis Fredrickson, MD   17 g at 06/23/16 0807  . risperiDONE (RISPERDAL) tablet 1 mg  1 mg Oral BID Clovis Fredrickson, MD   1 mg at 06/23/16 0807  . temazepam (RESTORIL) capsule 30 mg  30 mg Oral QHS Jolanta B Pucilowska, MD   30 mg at 06/19/16 2242    Lab Results:  Results for orders placed or performed during the hospital encounter of 06/13/16 (from the past 48 hour(s))  Glucose, capillary     Status: Abnormal   Collection Time: 06/21/16  8:13 PM  Result Value Ref Range   Glucose-Capillary 206 (H) 65 - 99 mg/dL  Glucose, capillary     Status: Abnormal   Collection Time: 06/22/16  8:31 PM  Result Value Ref Range   Glucose-Capillary 150 (H) 65 - 99 mg/dL    Blood Alcohol level:  Lab Results  Component Value Date   ETH <5 53/97/6734    Metabolic Disorder Labs: Lab Results  Component Value Date   HGBA1C 6.3 (H) 06/10/2016   MPG 134 06/10/2016   No results found for: PROLACTIN Lab Results  Component Value Date   CHOL 156 02/02/2016   TRIG 128 02/02/2016   HDL 49 02/02/2016   CHOLHDL 3.2 02/02/2016   VLDL 26 02/02/2016   LDLCALC 81 02/02/2016   LDLCALC 97 12/18/2014    Physical Findings: AIMS:  , ,  ,  , Dental Status Current problems with teeth and/or dentures?: No Does patient usually wear dentures?: No  CIWA:    COWS:     Musculoskeletal: Strength & Muscle Tone: within normal limits Gait & Station: normal Patient leans: N/A  Psychiatric Specialty Exam: Physical Exam  Nursing note and vitals reviewed.   Review of Systems  Musculoskeletal: Positive for back pain.  Psychiatric/Behavioral: Positive for hallucinations. The patient has insomnia.   All other systems reviewed and are negative.   Blood pressure 131/86, pulse 78, temperature 97.8 F (36.6 C), temperature source Oral, resp. rate 16, height 5' 2"  (1.575 m), weight 65.8 kg (145 lb), SpO2 100 %.Body mass index is 26.52 kg/m.  General Appearance: Casual  Eye Contact:  Good  Speech:  Pressured  Volume:  Increased  Mood:  Euphoric  Affect:  Congruent  Thought Process:  Disorganized and Descriptions of Associations: Tangential  Orientation:  Full (Time, Place, and Person)  Thought Content:  Delusions, Hallucinations: Auditory and Paranoid Ideation  Suicidal Thoughts:  No  Homicidal Thoughts:  No  Memory:  Immediate;   Fair Recent;   Fair Remote;   Fair  Judgement:  Poor  Insight:  Lacking  Psychomotor Activity:  Increased  Concentration:  Concentration: Fair and Attention Span: Fair  Recall:  [employer] of Knowledge:  Fair  Language:  Fair  Akathisia:  No  Handed:  Right  AIMS (if indicated):     Assets:  Communication Skills Desire for Improvement Financial Resources/Insurance Housing Physical Health Resilience Social Support  ADL's:  Intact  Cognition:  WNL  Sleep:  Number of Hours: 5.15     Treatment Plan Summary: Daily contact with patient to assess and evaluate symptoms and progress in treatment and Medication management   Ms. Reich is a 30-year0old female with a history of schizoaffective disorder admitted in a psychotic episode.  1. Psychosis. She was started on a combination of Depakote and Zyprexa 30 mg for mood stabilization. VPA level 75. Haldol was added sue to slow response. She refused Haldol and Zyprexa on Friday and was started on Risperdal. We will recheck VPA level and ammonia in am.   2. Hypothyroidism. She is on synthroid.  3. HTN. She is on Norvasc.  4. COPD. She is on inhaler.  5. CAD. Nitroglycerine is available.  6. Smoking. Nicotine patch is available.  7. GERD. She is on protonix.  8. Constipation. She is on bowel regimen.  9. Insomnia. Restoril 30 mg.   10. Metabolic syndrome monitoring. Lipid profile and hemoglobin A1c were performed recently.    11.  Disposition. She will be discharged to home  with family. She will follow up with [facility].   Orson Slick, MD 06/23/2016, 2:25 PM

## 2016-06-23 NOTE — Progress Notes (Signed)
Patient ID: ZDENKA LYDIC, female   DOB: Nov 04, 1950, 65 y.o.   MRN: [id number] Observed mostly on the phone, and pacing the hall, refused to attend group, delusional, "Dr. Geraldine Solar me to date his son, I told her I don't date white boys......" Denied pain, refused Temazepam 30 mg = 2 Capsules, "If you give me the white pill, Risperidone, I will take it>" She only took depakote DR 500 mg. She has been up 3 times to check the clock and returned to her room.

## 2016-06-23 NOTE — Progress Notes (Signed)
Recreation Therapy Notes  Date: 12.21.17 Time: 9:30 am Location: Craft Room  Group Topic: Coping Skills/Leisure Education  Goal Area(s) Addresses:  Patient will identify things they are grateful for. Patient will identify how being grateful can influence decision making.  Behavioral Response: Did not attend  Intervention: Grateful Wheel  Activity: Patients were given an I Am Grateful For worksheet and were instructed to write things they are grateful for under each category.  Education: LRT educated patients on why it is important to be grateful.  Education Outcome: Patient did not attend group.   Clinical Observations/Feedback: Patient did not attend group.  Leonette [facility], LRT/CTRS 06/23/2016 10:14 AM

## 2016-06-23 NOTE — [facility] Group Notes (Signed)
[facility] Group Notes:  (Nursing/MHT/Case Management/Adjunct)  Date:  06/23/2016  Time:  5:16 PM  Type of Therapy:  Psychoeducational Skills  Participation Level:  None  Charise Killian 06/23/2016, 5:16 PM

## 2016-06-23 NOTE — [facility] Group Notes (Signed)
[facility] Group Notes:  (Nursing/MHT/Case Management/Adjunct)  Date:  06/23/2016  Time:  7:15 AM  Type of Therapy:  Group Therapy  Participation Level:  Did Not Attend    [name] Long 06/23/2016, 7:15 AM 

## 2016-06-23 NOTE — [facility] Group Notes (Signed)
Goals Group  Date/Time: 06/23/2016  9:00AM Type of Therapy and Topic: Group Therapy: Goals Group: SMART Goals  ?  Participation Level: Moderate  ?  Description of Group:  ?  The purpose of a daily goals group is to assist and guide patients in setting recovery/wellness-related goals. The objective is to set goals as they relate to the crisis in which they were admitted. Patients will be using SMART goal modalities to set measurable goals. Characteristics of realistic goals will be discussed and patients will be assisted in setting and processing how one will reach their goal. Facilitator will also assist patients in applying interventions and coping skills learned in psycho-education groups to the SMART goal and process how one will achieve defined goal.  ?  Therapeutic Goals:  ?  -Patients will develop and document one goal related to or their crisis in which brought them into treatment.  -Patients will be guided by LCSW using SMART goal setting modality in how to set a measurable, attainable, realistic and time sensitive goal.  -Patients will process barriers in reaching goal.  -Patients will process interventions in how to overcome and successful in reaching goal.  ?  Patient's Goal: Pt invited but did not attend. ?  Therapeutic Modalities:  Motivational Interviewing  Cognitive Behavioral Therapy  Crisis Intervention Model  SMART goals setting  Glorious Peach, MSW, LCSW-A 06/23/2016, 10:32AM

## 2016-06-23 NOTE — Progress Notes (Addendum)
Patient with depressed affect, cooperative behavior with meals, meds and plan of care. No SI/HI at this time. Patient remains with disorganized thinking. Rests in bed during free time. Meets with treatment team this am. Safety maintained. Daughter phones to speak with patient.

## 2016-06-24 ENCOUNTER — Telehealth: Payer: Self-pay | Admitting: Family Medicine

## 2016-06-24 ENCOUNTER — Ambulatory Visit: Payer: Medicaid Other | Admitting: Family Medicine

## 2016-06-24 LAB — AMMONIA: Ammonia: 10 umol/L (ref 9–35)

## 2016-06-24 LAB — VALPROIC ACID LEVEL: Valproic Acid Lvl: 99 ug/mL (ref 50.0–100.0)

## 2016-06-24 LAB — POTASSIUM: Potassium: 4.5 mmol/L (ref 3.5–5.1)

## 2016-06-24 MED ORDER — RISPERIDONE 1 MG PO TABS: 1.0000 mg | ORAL_TABLET | Freq: Two times a day (BID) | ORAL | 1 refills | Status: DC

## 2016-06-24 MED ORDER — DIVALPROEX SODIUM 500 MG PO DR TAB: 500.0000 mg | DELAYED_RELEASE_TABLET | Freq: Three times a day (TID) | ORAL | 1 refills | Status: DC

## 2016-06-24 MED ORDER — LORAZEPAM 0.5 MG PO TABS: 0.5000 mg | ORAL_TABLET | Freq: Two times a day (BID) | ORAL | 0 refills | Status: DC

## 2016-06-24 MED ORDER — TEMAZEPAM 30 MG PO CAPS: 30.0000 mg | ORAL_CAPSULE | Freq: Every day | ORAL | 0 refills | Status: DC

## 2016-06-24 NOTE — Progress Notes (Signed)
Patient ID: [name] Long, female   DOB: 1950/12/11, 65 y.o.   MRN: [id number] Anticipating discharge to her daughter this morning; mood and affect still with angry affect, c/o pain 7/10 aching and discomfort right fore arm, Tylenol 650 mg PO PRN given with partial relief.

## 2016-06-24 NOTE — Progress Notes (Signed)
Patient denies SI/HI, denies A/V hallucinations. Patient verbalizes understanding of discharge instructions, follow up care and prescriptions. Patient given all belongings from  locker. Patient escorted out by staff, transported by family. 

## 2016-06-24 NOTE — Progress Notes (Signed)
  [facility] Adult Case Management Discharge Plan :  Will you be returning to the same living situation after discharge:  Yes, home with family.  At discharge, do you have transportation home?: Yes,  daughter Do you have the ability to pay for your medications: Yes,  patient has insurance  Release of information consent forms completed and in the chart;  Patient's signature needed at discharge.  Patient to Follow up at: Follow-up Information    [facility]. Go on 06/28/2016.   Specialty:  Behavioral Health Why:  Follow-up with [facility] for outpatient services on this date. Walk-in hours are 8a-3p M-F. Please bring discharge summary, current medications, and insurance information. Arrive early to ensure prompt service.  Contact information: [place] [phone]           Next level of care provider has access to [place] and Suicide Prevention discussed: Yes,  with daughter  Have you used any form of tobacco in the last 30 days? (Cigarettes, Smokeless Tobacco, Cigars, and/or Pipes): Yes  Has patient been referred to the Quitline?: Patient refused referral  Patient has been referred for addiction treatment: Yes  Amaris G. Wayne, [place] 06/24/2016, 9:25 AM

## 2016-06-24 NOTE — Tx Team (Signed)
Interdisciplinary Treatment and Diagnostic Plan Update   06/24/2016 (Late Entry from 06/23/2016) Time of Session: 11:00am [name] Long MRN: [id number]  Principal Diagnosis: Schizoaffective disorder, bipolar type ([place])  Secondary Diagnoses: Principal Problem:   Schizoaffective disorder, bipolar type ([place]) Active Problems:   Hypothyroidism   Type 2 diabetes, diet controlled ([place])   Tobacco use disorder   COPD (chronic obstructive pulmonary disease) ([place])   Current Medications:  Current Facility-Administered Medications  Medication Dose Route Frequency Provider Last Rate Last Dose  . acetaminophen (TYLENOL) tablet 650 mg  650 mg Oral Q6H PRN Clovis Fredrickson, MD   650 mg at 06/24/16 0504  . albuterol (PROVENTIL HFA;VENTOLIN HFA) 108 (90 Base) MCG/ACT inhaler 1-2 puff  1-2 puff Inhalation Q6H PRN Clovis Fredrickson, MD   2 puff at 06/17/16 0641  . alum & mag hydroxide-simeth (MAALOX/MYLANTA) 200-200-20 MG/5ML suspension 30 mL  30 mL Oral Q4H PRN Jolanta B Pucilowska, MD      . amLODipine (NORVASC) tablet 5 mg  5 mg Oral Daily Jolanta B Pucilowska, MD   5 mg at 06/24/16 0834  . divalproex (DEPAKOTE) DR tablet 500 mg  500 mg Oral Q8H Jolanta B Pucilowska, MD   500 mg at 06/24/16 [id number]  . docusate sodium (COLACE) capsule 100 mg  100 mg Oral BID Clovis Fredrickson, MD   100 mg at 06/23/16 1634  . levothyroxine (SYNTHROID, LEVOTHROID) tablet 50 mcg  50 mcg Oral QAC breakfast Clovis Fredrickson, MD   50 mcg at 06/24/16 [phone]  . LORazepam (ATIVAN) tablet 0.5 mg  0.5 mg Oral BID Clovis Fredrickson, MD   0.5 mg at 06/24/16 0834  . magnesium hydroxide (MILK OF MAGNESIA) suspension 30 mL  30 mL Oral Daily PRN Jolanta B Pucilowska, MD      . nicotine (NICODERM CQ - dosed in mg/24 hours) patch 21 mg  21 mg Transdermal Q0600 Clovis Fredrickson, MD   21 mg at 06/18/16 [id number]  . nitroGLYCERIN (NITROSTAT) SL tablet 0.4 mg  0.4 mg Sublingual Q5 min PRN Jolanta B Pucilowska, MD      .  pantoprazole (PROTONIX) EC tablet 40 mg  40 mg Oral Daily Jolanta B Pucilowska, MD   40 mg at 06/24/16 0834  . polyethylene glycol (MIRALAX / GLYCOLAX) packet 17 g  17 g Oral Daily Clovis Fredrickson, MD   17 g at 06/23/16 0807  . risperiDONE (RISPERDAL) tablet 1 mg  1 mg Oral BID Clovis Fredrickson, MD   1 mg at 06/24/16 0834  . temazepam (RESTORIL) capsule 30 mg  30 mg Oral QHS Jolanta B Pucilowska, MD   30 mg at 06/19/16 2242   PTA Medications: Prescriptions Prior to Admission  Medication Sig Dispense Refill Last Dose  . albuterol (PROVENTIL HFA) 108 (90 Base) MCG/ACT inhaler Inhale 2 puffs into the lungs every 4 (four) hours as needed. For wheezing 1 Inhaler 5 Unknown at Unknown  . amLODipine (NORVASC) 5 MG tablet Take 1 tablet (5 mg total) by mouth daily. 90 tablet 3 On file  . aspirin ([employer] ASPIRIN) 81 MG chewable tablet Chew 81 mg by mouth daily.     Unknown at Unknown  . beclomethasone (QVAR) 80 MCG/ACT inhaler Inhale 1 puff into the lungs 2 (two) times daily. 8.7 g 12 Unknown at Unknown  . divalproex (DEPAKOTE ER) 500 MG 24 hr tablet Take 1 tablet (500 mg total) by mouth 2 (two) times daily. 60 tablet 0   . docusate sodium (  COLACE) 100 MG capsule Take 1 capsule (100 mg total) by mouth every 12 (twelve) hours. 60 capsule 0 Unknown at Unknown  . fluticasone (FLONASE) 50 MCG/ACT nasal spray Place 2 sprays into both nostrils daily. (Patient not taking: Reported on 06/10/2016) 16 g 1 Completed Course at Unknown time  . glucose blood (ACCU-CHEK AVIVA PLUS) test strip Use as instructed (Patient not taking: Reported on 06/10/2016) 25 each 12 Not Taking at Unknown time  . ibuprofen (ADVIL,MOTRIN) 800 MG tablet Take 1 tablet (800 mg total) by mouth 3 (three) times daily. 21 tablet 0 Unknown at Unknown  . Lancets (ACCU-CHEK MULTICLIX) lancets TEST BLOOD SUGAR 1 TO 3 TIMES PER WEEK (Patient not taking: Reported on 06/10/2016) 102 each 3 Not Taking at Unknown time  . levothyroxine  (SYNTHROID) 50 MCG tablet Take 1 tablet (50 mcg total) by mouth daily before breakfast. 90 tablet 3 Unknown at Unknown  . nitroGLYCERIN (NITROSTAT) 0.4 MG SL tablet Place 1 tablet (0.4 mg total) under the tongue every 5 (five) minutes as needed for chest pain. 50 tablet 3 Unknown at Unknown  . OLANZapine zydis (ZYPREXA) 10 MG disintegrating tablet Take 1 tablet (10 mg total) by mouth at bedtime. 30 tablet 0   . olopatadine (PATANOL) 0.1 % ophthalmic solution Place 1 drop into both eyes 2 (two) times daily. 5 mL 2 Unknown at Unknown  . omeprazole (PRILOSEC) 40 MG capsule Take 1 capsule (40 mg total) by mouth daily. 30 capsule 11 Unknown at Unknown  . polyethylene glycol (MIRALAX) packet Take 17 g by mouth daily. 14 each 0 Unknown at Unknown  . pravastatin (PRAVACHOL) 40 MG tablet take 1 tablet by mouth every evening (Patient taking differently: Take 40 mg by mouth every evening. ) 30 tablet 11 Unknown at Unknown  . risperiDONE (RISPERDAL M-TABS) 0.5 MG disintegrating tablet Take 1 tablet (0.5 mg total) by mouth 2 (two) times daily. 60 tablet 0     Patient Stressors: Medication change or noncompliance  Patient Strengths: General fund of knowledge Supportive family/friends  Treatment Modalities: Medication Management, Group therapy, Case management,  1 to 1 session with clinician, Psychoeducation, Recreational therapy.   Physician Treatment Plan for Primary Diagnosis: Schizoaffective disorder, bipolar type ([place]) Long Term Goal(s): Improvement in symptoms so as ready for discharge NA   Short Term Goals: Ability to identify changes in lifestyle to reduce recurrence of condition will improve Ability to verbalize feelings will improve Ability to disclose and discuss suicidal ideas Ability to demonstrate self-control will improve Ability to identify and develop effective coping behaviors will improve Ability to maintain clinical measurements within normal limits will improve Compliance with  prescribed medications will improve NA  Medication Management: Evaluate patient's response, side effects, and tolerance of medication regimen.  Therapeutic Interventions: 1 to 1 sessions, Unit Group sessions and Medication administration.  Evaluation of Outcomes: Adequate for Discharge  Physician Treatment Plan for Secondary Diagnosis: Principal Problem:   Schizoaffective disorder, bipolar type ([place]) Active Problems:   Hypothyroidism   Type 2 diabetes, diet controlled ([place])   Tobacco use disorder   COPD (chronic obstructive pulmonary disease) ([place])  Long Term Goal(s): Improvement in symptoms so as ready for discharge NA   Short Term Goals: Ability to identify changes in lifestyle to reduce recurrence of condition will improve Ability to verbalize feelings will improve Ability to disclose and discuss suicidal ideas Ability to demonstrate self-control will improve Ability to identify and develop effective coping behaviors will improve Ability to maintain clinical measurements within normal  limits will improve Compliance with prescribed medications will improve NA     Medication Management: Evaluate patient's response, side effects, and tolerance of medication regimen.  Therapeutic Interventions: 1 to 1 sessions, Unit Group sessions and Medication administration.  Evaluation of Outcomes: Adequate for Discharge   RN Treatment Plan for Primary Diagnosis: Schizoaffective disorder, bipolar type ([place]) Long Term Goal(s): Knowledge of disease and therapeutic regimen to maintain health will improve  Short Term Goals: Ability to verbalize frustration and anger appropriately will improve, Ability to demonstrate self-control, Ability to participate in decision making will improve, Ability to identify and develop effective coping behaviors will improve and Compliance with prescribed medications will improve  Medication Management: RN will administer medications as ordered by provider, will  assess and evaluate patient's response and provide education to patient for prescribed medication. RN will report any adverse and/or side effects to prescribing provider.  Therapeutic Interventions: 1 on 1 counseling sessions, Psychoeducation, Medication administration, Evaluate responses to treatment, Monitor vital signs and CBGs as ordered, Perform/monitor CIWA, COWS, AIMS and Fall Risk screenings as ordered, Perform wound care treatments as ordered.  Evaluation of Outcomes: Adequate for Discharge   LCSW Treatment Plan for Primary Diagnosis: Schizoaffective disorder, bipolar type ([place]) Long Term Goal(s): Safe transition to appropriate next level of care at discharge, Engage patient in therapeutic group addressing interpersonal concerns.  Short Term Goals: Engage patient in aftercare planning with referrals and resources, Increase social support, Increase ability to appropriately verbalize feelings and Increase skills for wellness and recovery Therapeutic Interventions: Assess for all discharge needs, 1 to 1 time with Social worker, Explore available resources and support systems, Assess for adequacy in community support network, Educate family and significant other(s) on suicide prevention, Complete Psychosocial Assessment, Interpersonal group therapy.  Evaluation of Outcomes: Adequate for Discharge   Progress in Treatment: Attending groups: No. Participating in groups: No. Taking medication as prescribed: Yes. Toleration medication: Yes. Family/Significant other contact made: Yes, individual(s) contacted:  daughter Patient understands diagnosis: Yes. Discussing patient identified problems/goals with staff: Yes. Medical problems stabilized or resolved: Yes. Denies suicidal/homicidal ideation: Yes. Issues/concerns per patient self-inventory: No. Other: n/a  New problem(s) identified: None identified at this time.   New Short Term/Long Term Goal(s): None identified at this time.    Discharge Plan or Barriers: Patient will discharge home with family and follow-up with [facility].   Reason for Continuation of Hospitalization: D/C 06/24/2016  Estimated Length of Stay: D/C 06/24/2016  Attendees: Patient: [name] Long 06/24/2016 9:11 AM  Physician: Dr. Bary Leriche, MD 06/24/2016 9:11 AM  Nursing: Carolynn Sayers, RN 06/24/2016 9:11 AM  RN Care Manager: 06/24/2016 9:11 AM  Social Worker: Lear Ng. Claybon Jabs MSW, [place] 06/24/2016 9:11 AM  Recreational Therapist: Leonette Monarch, LRT/CTRS 06/24/2016 9:11 AM  Other:  06/24/2016 9:11 AM  Other:  06/24/2016 9:11 AM  Other: 06/24/2016 9:11 AM    Scribe for Treatment Team: Jolaine Click, LCSWA 06/24/2016 9:23 AM
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
## 2016-06-24 NOTE — Telephone Encounter (Signed)
Pt called because she is having a lot of issues with her joints. She has been getting cortisone shots, but now the shots have made this worse. She said that she also has 3 different bones broke in one leg. She needs the referral to [place]. jw

## 2016-06-24 NOTE — BHH Suicide Risk Assessment (Signed)
[facility] Discharge Suicide Risk Assessment   Principal Problem: Schizoaffective disorder, bipolar type [facility]) Discharge Diagnoses:  Patient Active Problem List   Diagnosis Date Noted  . Schizoaffective disorder, bipolar type ([place]) [F25.0]   . URI (upper respiratory infection) [J06.9] 03/09/2016  . Muscle strain of chest wall [S29.011A] 10/21/2015  . Activities of daily living deficit involving total body bathing [IMO0001] 08/10/2015  . Vitamin D deficiency [E55.9] 12/19/2014  . Preventative health care [Z00.00] 03/21/2014  . Bronchitis, chronic obstructive w acute bronchitis ([place]) [J44.0] 01/07/2014  . Osteoarthritis, multiple sites [M15.9] 06/08/2011  . Vaginal discharge [N89.8] 01/14/2011  . COPD (chronic obstructive pulmonary disease) ([place]) [J44.9] 09/23/2010  . CHRONIC KIDNEY DISEASE STAGE II (MILD) [N18.2] 09/14/2009  . Hypothyroidism [E03.9] 08/31/2006  . Type 2 diabetes, diet controlled ([place]) [E11.9] 08/31/2006  . HYPERCHOLESTEROLEMIA [E78.00] 08/31/2006  . Tobacco use disorder [F17.200] 08/31/2006  . HYPERTENSION, BENIGN SYSTEMIC [I10] 08/31/2006    Total Time spent with patient: 30 minutes  Musculoskeletal: Strength & Muscle Tone: within normal limits Gait & Station: normal Patient leans: N/A  Psychiatric Specialty Exam: Review of Systems  Psychiatric/Behavioral: Positive for hallucinations.  All other systems reviewed and are negative.   Blood pressure 119/75, pulse 61, temperature 97.8 F (36.6 C), resp. rate 18, height 5\' 2"  (1.575 m), weight 65.8 kg (145 lb), SpO2 100 %.Body mass index is 26.52 kg/m.  General Appearance: Casual  Eye Contact::  Good  Speech:  Clear and Coherent409  Volume:  Normal  Mood:  Euphoric  Affect:  Labile  Thought Process:  Linear and Descriptions of Associations: Tangential  Orientation:  Full (Time, Place, and Person)  Thought Content:  Delusions and Paranoid Ideation  Suicidal Thoughts:  No  Homicidal Thoughts:  No  Memory:   Immediate;   Fair Recent;   Fair Remote;   Fair  Judgement:  Impaired  Insight:  Shallow  Psychomotor Activity:  Normal  Concentration:  Fair  Recall:  [place]  Language: Fair  Akathisia:  No  Handed:  Right  AIMS (if indicated):     Assets:  Communication Skills Desire for Improvement Financial Resources/Insurance Housing Physical Health Resilience Social Support  Sleep:  Number of Hours: 3.15  Cognition: WNL  ADL's:  Intact   Mental Status Per Nursing Assessment::   On Admission:  NA  Demographic Factors:  NA  Loss Factors: Financial problems/change in socioeconomic status  Historical Factors: Prior suicide attempts, Family history of mental illness or substance abuse and Impulsivity  Risk Reduction Factors:   Sense of responsibility to family, Living with another person, especially a relative and Positive social support  Continued Clinical Symptoms:  Bipolar Disorder:   Mixed State Schizophrenia:   Paranoid or undifferentiated type  Cognitive Features That Contribute To Risk:  None    Suicide Risk:  Minimal: No identifiable suicidal ideation.  Patients presenting with no risk factors but with morbid ruminations; may be classified as minimal risk based on the severity of the depressive symptoms  Follow-up Information    [facility]. Go on 06/28/2016.   Specialty:  Behavioral Health Why:  Follow-up with [facility] for outpatient services on this date. Walk-in hours are 8a-3p M-F. Please bring discharge summary, current medications, and insurance information. Arrive early to ensure prompt service.  Contact information: [place] [address] [phone]           Plan Of Care/Follow-up recommendations:  Activity:  as tolerated. Diet:  low sodium heart healthy ADA diet.  Other:  keep follow up appointments.  Orson Slick, MD 06/24/2016, 8:08 AM

## 2016-06-29 NOTE — Telephone Encounter (Signed)
Please have patient schedule an appointment to be seen so that we can place the referral.

## 2016-06-30 ENCOUNTER — Telehealth: Payer: Self-pay | Admitting: Family Medicine

## 2016-06-30 ENCOUNTER — Encounter (HOSPITAL_COMMUNITY): Payer: Self-pay | Admitting: Emergency Medicine

## 2016-06-30 ENCOUNTER — Emergency Department (HOSPITAL_COMMUNITY)
Admission: EM | Admit: 2016-06-30 | Discharge: 2016-07-01 | Disposition: A | Discharge status: Home / Self Care | Payer: Medicaid Other | Attending: Emergency Medicine | Admitting: Emergency Medicine

## 2016-06-30 DIAGNOSIS — M25531 Pain in right wrist: Secondary | ICD-10-CM

## 2016-06-30 DIAGNOSIS — E039 Hypothyroidism, unspecified: Secondary | ICD-10-CM | POA: Insufficient documentation

## 2016-06-30 DIAGNOSIS — J449 Chronic obstructive pulmonary disease, unspecified: Secondary | ICD-10-CM | POA: Diagnosis not present

## 2016-06-30 DIAGNOSIS — F17210 Nicotine dependence, cigarettes, uncomplicated: Secondary | ICD-10-CM | POA: Diagnosis not present

## 2016-06-30 DIAGNOSIS — E1122 Type 2 diabetes mellitus with diabetic chronic kidney disease: Secondary | ICD-10-CM | POA: Insufficient documentation

## 2016-06-30 DIAGNOSIS — I129 Hypertensive chronic kidney disease with stage 1 through stage 4 chronic kidney disease, or unspecified chronic kidney disease: Secondary | ICD-10-CM | POA: Insufficient documentation

## 2016-06-30 DIAGNOSIS — Z79899 Other long term (current) drug therapy: Secondary | ICD-10-CM | POA: Diagnosis not present

## 2016-06-30 DIAGNOSIS — N182 Chronic kidney disease, stage 2 (mild): Secondary | ICD-10-CM | POA: Insufficient documentation

## 2016-06-30 DIAGNOSIS — Z7982 Long term (current) use of aspirin: Secondary | ICD-10-CM | POA: Diagnosis not present

## 2016-06-30 DIAGNOSIS — G8929 Other chronic pain: Secondary | ICD-10-CM | POA: Diagnosis not present

## 2016-06-30 NOTE — ED Triage Notes (Signed)
Per EMS: Pt from home.  C/o pain and swelling to rt forearm, rt wrist, anterior neck, bilateral feet, lt knee, rt arm, rt shoulder pain, low back pain x "150 days".  No injury.

## 2016-06-30 NOTE — Telephone Encounter (Signed)
After Hours Telephone Call  Patient calls to state that she is currently in the hallway waiting at [facility] ED.  She would like for her PCP to call into the back and find out how much longer her wait will be and see if she can speed it up.  I inform the patient that that is not the purpose of the after hours line and she became upset and hung up the phone.  Will forward to PCP.  Virginia Crews, MD, MPH PGY-3,  [facility] 06/30/2016 10:44 PM

## 2016-07-01 ENCOUNTER — Telehealth: Payer: Self-pay | Admitting: Family Medicine

## 2016-07-01 ENCOUNTER — Emergency Department (HOSPITAL_COMMUNITY): Payer: Medicaid Other

## 2016-07-01 NOTE — Telephone Encounter (Signed)
Narcotics will not be called in without an office visit. If she is having significant swelling of her face she needs to be evaluated.  [name] Long. Jerline Pain, [facility] Resident PGY-3 07/01/2016 5:17 PM

## 2016-07-01 NOTE — Discharge Instructions (Signed)
Please read attached information. If you experience any new or worsening signs or symptoms please return to the emergency room for evaluation. Please follow-up with your primary care provider or specialist as discussed.  °

## 2016-07-01 NOTE — Telephone Encounter (Signed)
Pt is calling and would like some hydrocodone called in. She has some ibuprofen 800 mg but she has swollen face and hands. Plus she is very itchy. jw

## 2016-07-01 NOTE — ED Notes (Signed)
Pt to xray
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
## 2016-07-01 NOTE — ED Provider Notes (Signed)
[place] DEPT Provider Note   CSN: [id number] Arrival date & time: 06/30/16  2205     History   Chief Complaint Chief Complaint  Patient presents with  . Multiple Complaints    HPI [name] Long is a 65 y.o. female.  HPI   65 year old female presents today with numerous complaints. Patient reports chronic history of right wrist pain, right shoulder pain, left shoulder pain. Patient reports several months ago she jammed her wrist while holding a cane on the right. She reports swelling to the wrist which is improved since the incident, but reports that she still has minor pain from time to time. Patient also notes chronic shoulder pain for which she's been seen by orthopedics. Patient is requesting orthopedic referral as she cannot get in to see her primary care, also requesting x-ray of her right wrist.   Past Medical History:  Diagnosis Date  . BIPOLAR DISORDER 08/31/2006   Qualifier: Diagnosis of  By: Dorathy Daft MD, Marjory Lies    . CHRONIC KIDNEY DISEASE STAGE II (MILD) 09/14/2009   Annotation: eGFR 64 Qualifier: Diagnosis of  By: Jess Barters MD, [name]    . COPD (chronic obstructive pulmonary disease) ([place]) 09/23/2010   Diagnosed at [facility] in 2008 (Dr. Annamaria Boots)   . DM (diabetes mellitus) type II controlled with renal manifestation ([place]) 08/31/2006   Qualifier: Diagnosis of  By: Dorathy Daft MD, Marjory Lies    . HYPERCHOLESTEROLEMIA 08/31/2006   Intolerance to Lipitor OK on Crestor but medicaid no longer covering    . HYPERTENSION, BENIGN SYSTEMIC 08/31/2006   Qualifier: Diagnosis of  By: Dorathy Daft MD, Marjory Lies    . HYPOTHYROIDISM, UNSPECIFIED 08/31/2006   Qualifier: Diagnosis of  By: Dorathy Daft MD, Marjory Lies      Patient Active Problem List   Diagnosis Date Noted  . Schizoaffective disorder, bipolar type ([place])   . URI (upper respiratory infection) 03/09/2016  . Muscle strain of chest wall 10/21/2015  . Activities of daily living deficit involving total body bathing 08/10/2015  . Vitamin D  deficiency 12/19/2014  . Preventative health care 03/21/2014  . Bronchitis, chronic obstructive w acute bronchitis ([place]) 01/07/2014  . Osteoarthritis, multiple sites 06/08/2011  . Vaginal discharge 01/14/2011  . COPD (chronic obstructive pulmonary disease) ([place]) 09/23/2010  . CHRONIC KIDNEY DISEASE STAGE II (MILD) 09/14/2009  . Hypothyroidism 08/31/2006  . Type 2 diabetes, diet controlled ([place]) 08/31/2006  . HYPERCHOLESTEROLEMIA 08/31/2006  . Tobacco use disorder 08/31/2006  . HYPERTENSION, BENIGN SYSTEMIC 08/31/2006    Past Surgical History:  Procedure Laterality Date  . CESAREAN SECTION    . FOOT SURGERY    . KNEE SURGERY Bilateral     OB History    No data available       Home Medications    Prior to Admission medications   Medication Sig Start Date End Date Taking? Authorizing Provider  albuterol (PROVENTIL HFA) 108 (90 Base) MCG/ACT inhaler Inhale 2 puffs into the lungs every 4 (four) hours as needed. For wheezing 05/08/16   Smiley Houseman, MD  amLODipine (NORVASC) 5 MG tablet Take 1 tablet (5 mg total) by mouth daily. 05/28/16   Dorie Rank, MD  aspirin (BAYER CHILDRENS ASPIRIN) 81 MG chewable tablet Chew 81 mg by mouth daily.      Historical Provider, MD  beclomethasone (QVAR) 80 MCG/ACT inhaler Inhale 1 puff into the lungs 2 (two) times daily. 05/08/16   Smiley Houseman, MD  divalproex (DEPAKOTE) 500 MG DR tablet Take 1 tablet (500 mg total) by mouth every  8 (eight) hours. 06/24/16   Clovis Fredrickson, MD  docusate sodium (COLACE) 100 MG capsule Take 1 capsule (100 mg total) by mouth every 12 (twelve) hours. 05/28/16   Dorie Rank, MD  fluticasone (FLONASE) 50 MCG/ACT nasal spray Place 2 sprays into both nostrils daily. Patient not taking: Reported on 06/10/2016 12/21/15   [name], DO  glucose blood (ACCU-CHEK AVIVA PLUS) test strip Use as instructed Patient not taking: Reported on 06/10/2016 04/21/15   [name], DO  ibuprofen (ADVIL,MOTRIN)  800 MG tablet Take 1 tablet (800 mg total) by mouth 3 (three) times daily. 05/28/16   Dorie Rank, MD  Lancets (ACCU-CHEK MULTICLIX) lancets TEST BLOOD SUGAR 1 TO 3 TIMES PER WEEK Patient not taking: Reported on 06/10/2016 01/20/16   [name], DO  levothyroxine (SYNTHROID) 50 MCG tablet Take 1 tablet (50 mcg total) by mouth daily before breakfast. 05/08/16   Smiley Houseman, MD  LORazepam (ATIVAN) 0.5 MG tablet Take 1 tablet (0.5 mg total) by mouth 2 (two) times daily. 06/24/16   Clovis Fredrickson, MD  nitroGLYCERIN (NITROSTAT) 0.4 MG SL tablet Place 1 tablet (0.4 mg total) under the tongue every 5 (five) minutes as needed for chest pain. 02/26/15   [name], DO  olopatadine (PATANOL) 0.1 % ophthalmic solution Place 1 drop into both eyes 2 (two) times daily. 05/17/16   Smiley Houseman, MD  omeprazole (PRILOSEC) 40 MG capsule Take 1 capsule (40 mg total) by mouth daily. 05/08/16   Smiley Houseman, MD  polyethylene glycol [facility]) packet Take 17 g by mouth daily. 05/28/16   Dorie Rank, MD  pravastatin (PRAVACHOL) 40 MG tablet take 1 tablet by mouth every evening Patient taking differently: Take 40 mg by mouth every evening.  05/08/16   Smiley Houseman, MD  risperiDONE (RISPERDAL) 1 MG tablet Take 1 tablet (1 mg total) by mouth 2 (two) times daily. 06/24/16   Clovis Fredrickson, MD  temazepam (RESTORIL) 30 MG capsule Take 1 capsule (30 mg total) by mouth at bedtime. 06/24/16   Clovis Fredrickson, MD    Family History No family history on file.  Social History Social History  Substance Use Topics  . Smoking status: Current Every Day Smoker    Packs/day: 0.50    Years: 39.00    Types: Cigarettes  . Smokeless tobacco: Never Used  . Alcohol use Yes     Allergies   Ibuprofen; Lipitor [atorvastatin calcium]; Lisinopril; and Codeine   Review of Systems Review of Systems  All other systems reviewed and are negative.    Physical Exam Updated Vital  Signs BP 112/75 (BP Location: Left Arm)   Pulse 89   Temp 98.3 F (36.8 C) (Oral)   Resp 18   SpO2 100%   Physical Exam  Constitutional: She is oriented to person, place, and time. She appears well-developed and well-nourished.  HENT:  Head: Normocephalic and atraumatic.  Eyes: Conjunctivae are normal. Pupils are equal, round, and reactive to light. Right eye exhibits no discharge. Left eye exhibits no discharge. No scleral icterus.  Neck: Normal range of motion. No JVD present. No tracheal deviation present.  Pulmonary/Chest: Effort normal. No stridor.  Musculoskeletal:   Small amount of swelling to the right distal wrist, no redness warmth to touch or significant tenderness to palpation. Wrist full active range of motion, grip strength 5 out of 5, sensation intact  Neurological: She is alert and oriented to person, place, and time. Coordination  normal.  Psychiatric: She has a normal mood and affect. Her behavior is normal. Judgment and thought content normal.  Nursing note and vitals reviewed.   ED Treatments / Results  Labs (all labs ordered are listed, but only abnormal results are displayed) Labs Reviewed - No data to display  EKG  EKG Interpretation None       Radiology Dg Wrist Complete Right  Result Date: 07/01/2016 CLINICAL DATA:  Acute onset of right wrist pain.  Initial encounter. EXAM: RIGHT WRIST - COMPLETE 3+ VIEW COMPARISON:  None. FINDINGS: There is no evidence of fracture or dislocation. The carpal rows are intact, and demonstrate normal alignment. Mild degenerative change is noted at the first carpometacarpal joint. No significant soft tissue abnormalities are seen. IMPRESSION: No evidence of fracture or dislocation. Electronically Signed   By: Garald Balding M.D.   On: 07/01/2016 00:29    Procedures Procedures (including critical care time)  Medications Ordered in ED Medications - No data to display   Initial Impression / Assessment and Plan / ED  Course  I have reviewed the triage vital signs and the nursing notes.  Pertinent labs & imaging results that were available during my care of the patient were reviewed by me and considered in my medical decision making (see chart for details).  Clinical Course      Final Clinical Impressions(s) / ED Diagnoses   Final diagnoses:  Right wrist pain   65 year old female presents today with multiple chronic complaints. Patient requesting x-ray of her wrist, this shows no acute fracture or abnormality. Patient requesting referral to orthopedics, she reports chronic shoulder pain, she will be referred to orthopedics for evaluation of her ongoing chronic issues. She has no acute problems here that would necessitate further evaluation or management here in the ED. Patient verbalized understanding and agreement to today's plan had no further questions or concerns at time of discharge  New Prescriptions New Prescriptions   No medications on file     Okey Regal, PA-C 07/01/16 0040    [name], MD 07/01/16 [phone]

## 2016-07-06 ENCOUNTER — Other Ambulatory Visit: Payer: Self-pay | Admitting: Family Medicine

## 2016-07-06 DIAGNOSIS — M159 Polyosteoarthritis, unspecified: Secondary | ICD-10-CM

## 2016-07-06 NOTE — Telephone Encounter (Signed)
This has been completed.

## 2016-07-07 ENCOUNTER — Telehealth: Payer: Self-pay | Admitting: Family Medicine

## 2016-07-07 NOTE — Telephone Encounter (Signed)
Patient has appointment on 1/15. Nat Christen, CMA

## 2016-07-07 NOTE — Telephone Encounter (Signed)
Pt wanted to let Dr. Lajuana Ripple know that she doesn't have arthritis and that's not why she needs to see ortho. Pt states its lower right back pain, left knee pain, and muscles on both sides of pt's neck. ep

## 2016-07-07 NOTE — Telephone Encounter (Signed)
Appointment scheduled for 07/18/16. Nat Christen, CMA

## 2016-07-08 ENCOUNTER — Telehealth: Payer: Self-pay | Admitting: Family Medicine

## 2016-07-08 NOTE — Telephone Encounter (Signed)
Spoke with patient again and relayed information.  She is willing to see another orthopedist.  She asks that she be called after 2pm today for appointment, as she is going to be running errands until that time.

## 2016-07-08 NOTE — Telephone Encounter (Signed)
Called and spoke to [place] at [facility] about why patient was dismissed from their practice. Per patient's chart, Dr. French Ana decided that patient could not longer be seen there due to numerous bad behaviors on patient's part. Her last appointment with Dr. French Ana was 03/28/16. The last instance was 04/28/16 when patient came in and became angry at the front office staff and raised her hand at them and also swiped things off their desk, breaking items. [employer] was called that day. She has in the past cursed out Dr. Alvester Morin nurse because she could not get pain meds early. Unfortunately, [facility] will not be taking this patient back. She has no choice but to find another orthopedic office.   [place]

## 2016-07-08 NOTE — Telephone Encounter (Signed)
I have already spoken to the office of  [facility], last week and they WILL NOT take patient back. This is not the first case of her ill behavior there per their office.

## 2016-07-08 NOTE — Telephone Encounter (Signed)
I spoke to patient she denies having "done anything wrong".  She denies having fought with the personnel at [facility].  She notes that the "girls up front are against her and that's why Dr French Ana no longer wants to see her".  She wants to go back to Dr French Ana and "does not want to see someone else."  She wants to see if by any chance they will take her back at [facility].  I have cc'd note to [place] to see if we can try and get her back to that office, though I noted that their office policy likely will not allow her to return and she may need to see another practice.

## 2016-07-11 DIAGNOSIS — F209 Schizophrenia, unspecified: Secondary | ICD-10-CM | POA: Diagnosis not present

## 2016-07-15 ENCOUNTER — Encounter (INDEPENDENT_AMBULATORY_CARE_PROVIDER_SITE_OTHER): Payer: Self-pay

## 2016-07-15 ENCOUNTER — Ambulatory Visit (INDEPENDENT_AMBULATORY_CARE_PROVIDER_SITE_OTHER): Payer: Medicare Other | Admitting: Orthopedic Surgery

## 2016-07-15 VITALS — Ht 62.0 in | Wt 145.0 lb

## 2016-07-15 DIAGNOSIS — I87322 Chronic venous hypertension (idiopathic) with inflammation of left lower extremity: Secondary | ICD-10-CM | POA: Diagnosis not present

## 2016-07-15 MED ORDER — NABUMETONE 750 MG PO TABS: 750.0000 mg | ORAL_TABLET | Freq: Two times a day (BID) | ORAL | 3 refills | Status: AC | PRN

## 2016-07-15 NOTE — Progress Notes (Signed)
Office Visit Note   Patient: [name] Long           Date of Birth: [birth year]           MRN: [id number] Visit Date: 07/15/2016              Requested by: [name], DO [address], [place] [address] PCP: Ronnie Doss, DO  No chief complaint on file.   HPI: Trying to obtain information from the patient today is not successful. She began the visit stating that she ws having pain on the entire left side of her body. When I tried to ask questions to pin point a location she began talking about people coming into her house from [place] and attaching something to her heat and "parading in front of my house with masks on that look like me. I am not super beautiful for these dikes and hoes to want to look like me" Made every attempt to redirect conversation back to why she was here. The conversation never makes it to any type of meaningful documentation of a medical history. 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 She states the people at been stealing her medication. She states that she is on lorazepam and states that she needs a refill for her Vicodin. Patient complains of chronic pain involving the left leg left shoulder right shoulder lumbar spine both knees pain radiating from her left foot up to her left shoulder and pain radiating from her left thumb up to her left shoulder. Assessment & Plan: Visit Diagnoses: No diagnosis found.  Plan: Prescription called in for Relafen for her chronic pain symptoms. Discussed the importance of not using narcotics for chronic pain. Patient was given a prescription to go to [facility] supply for compression stockings for venous stasis changes in the left leg.  Follow-Up Instructions: Return in about 4 weeks (around 08/12/2016).   Ortho Exam Examination patient is alert oriented no adenopathy well-dressed normal affect she has a normal gait. Patient was wandering around the office and she  left  to go outside  to smoke to calm her nerves. She has full range of motion of both upper and lower extremities no focal weakness or motor deficits no signs of cellulitis or infection. Left leg does have chronic venous stasis swelling but no ulcerations.  Imaging: No results found.  Orders:  No orders of the defined types were placed in this encounter.  Meds ordered this encounter  Medications  . nabumetone (RELAFEN) 750 MG tablet    Sig: Take 1 tablet (750 mg total) by mouth 2 (two) times daily as needed for mild pain or moderate pain. with food    Dispense:  60 tablet    Refill:  3     Procedures: No procedures performed  Clinical Data: No additional findings.  Subjective: Review of Systems  Objective: Vital Signs: Ht _0  (1.575 m)   Wt 145 lb (65.8 kg)   BMI 26.52 kg/m   Specialty Comments:  No specialty comments available.  PMFS History: Patient Active Problem List   Diagnosis Date Noted  . Schizoaffective disorder, bipolar type ([place])   . URI (upper respiratory infection) 03/09/2016  . Muscle strain of chest wall 10/21/2015  . Activities of daily living deficit involving total body bathing 08/10/2015  . Vitamin D deficiency 12/19/2014  . Preventative health care 03/21/2014  . Bronchitis, chronic obstructive w acute bronchitis ([place]) 01/07/2014  .  Osteoarthritis, multiple sites 06/08/2011  . Vaginal discharge 01/14/2011  . COPD (chronic obstructive pulmonary disease) ([place]) 09/23/2010  . CHRONIC KIDNEY DISEASE STAGE II (MILD) 09/14/2009  . Hypothyroidism 08/31/2006  . Type 2 diabetes, diet controlled ([place]) 08/31/2006  . HYPERCHOLESTEROLEMIA 08/31/2006  . Tobacco use disorder 08/31/2006  . HYPERTENSION, BENIGN SYSTEMIC 08/31/2006   Past Medical History:  Diagnosis Date  . BIPOLAR DISORDER 08/31/2006   Qualifier: Diagnosis of  By: Dorathy Daft MD, Marjory Lies    . CHRONIC KIDNEY DISEASE STAGE II (MILD) 09/14/2009   Annotation: eGFR 64 Qualifier: Diagnosis of  By: Jess Barters  MD, [name]    . COPD (chronic obstructive pulmonary disease) ([place]) 09/23/2010   Diagnosed at [facility] in 2008 (Dr. Annamaria Boots)   . DM (diabetes mellitus) type II controlled with renal manifestation ([place]) 08/31/2006   Qualifier: Diagnosis of  By: Dorathy Daft MD, Marjory Lies    . HYPERCHOLESTEROLEMIA 08/31/2006   Intolerance to Lipitor OK on Crestor but medicaid no longer covering    . HYPERTENSION, BENIGN SYSTEMIC 08/31/2006   Qualifier: Diagnosis of  By: Dorathy Daft MD, Marjory Lies    . HYPOTHYROIDISM, UNSPECIFIED 08/31/2006   Qualifier: Diagnosis of  By: Dorathy Daft MD, Marjory Lies      No family history on file.  Past Surgical History:  Procedure Laterality Date  . CESAREAN SECTION    . FOOT SURGERY    . KNEE SURGERY Bilateral    Social History   Occupational History  . Not on file.   Social History Main Topics  . Smoking status: Current Every Day Smoker    Packs/day: 0.50    Years: 39.00    Types: Cigarettes  . Smokeless tobacco: Never Used  . Alcohol use Yes  . Drug use: No  . Sexual activity: Not on file

## 2016-07-16 ENCOUNTER — Telehealth: Payer: Self-pay | Admitting: Internal Medicine

## 2016-07-16 NOTE — Telephone Encounter (Signed)
Received page to [facility] Emergency after-hours line from patient. She states she was referred to Dr. Sharol Given by her old Orthopedist for worsening osteoarthritis. She reports she normally gets hydrocodone every 3 months from her last Orthopedist and now is out and was only given relafen by Dr. Sharol Given, which just makes her sleepy. She is requesting a refill of her pain medication. Explained that [facility] has not been providing this medication, so she would need to be evaluated first. She already has an appointment with her PCP on Monday. Explained that even with appointment her PCP may not feel hydrocodone is appropriate to treat her pain, as NSAIDs are more effective for OA and opioids increase risk of death. She expressed understanding.   Olene Floss, MD [place], PGY-2

## 2016-07-18 ENCOUNTER — Encounter: Payer: Self-pay | Admitting: Family Medicine

## 2016-07-18 ENCOUNTER — Ambulatory Visit (INDEPENDENT_AMBULATORY_CARE_PROVIDER_SITE_OTHER): Payer: Medicare Other | Admitting: Family Medicine

## 2016-07-18 VITALS — BP 110/70 | HR 82 | Temp 98.1°F | Wt 159.0 lb

## 2016-07-18 DIAGNOSIS — F250 Schizoaffective disorder, bipolar type: Secondary | ICD-10-CM | POA: Diagnosis not present

## 2016-07-18 DIAGNOSIS — Z659 Problem related to unspecified psychosocial circumstances: Secondary | ICD-10-CM | POA: Diagnosis not present

## 2016-07-18 DIAGNOSIS — M159 Polyosteoarthritis, unspecified: Secondary | ICD-10-CM

## 2016-07-18 NOTE — Progress Notes (Signed)
    Subjective: CC: osteoarthritis HPI: [name] Long is a 66 y.o. female presenting to clinic today for:  1. OA Patient recently discharged from [facility] for misconduct.  She has reestablished with Dr Sharol Given at [facility].  Last visit 07/15/16.  She reports that she was prescribed a new medication but that she does not find it to be helpful.  She notes that she request Norco but that this was not filled.  She reports pain in multiple joints including shoulders and knees and cites that she wanted to get gel shots in her knees.  Ortho note reviewed, it appears that patient was acting inappropriately at that visit as well.    Social Hx reviewed: non smoker. MedHx, medications and allergies reviewed.  Please see EMR. Health Maintenance: Flu Vaccine: yes  ROS: Per HPI  Objective: Office vital signs reviewed. BP 110/70   Pulse 82   Temp 98.1 F (36.7 C) (Oral)   Wt 159 lb (72.1 kg)   SpO2 99%   BMI 29.08 kg/m   Physical Examination:  General: Awake, alert, well nourished, No acute distress MSK: ambulating with cane, nonpitting pedal edema present. Psych: mood labile, thought process erratic, some paranoid thought (patient citing that her daughter had messed up her section 8 housing and that [name] shut off her electricity because someone ran up her bill while she was hospitalized but no one will tell her who lived in her home)  Assessment/ Plan: 66 y.o. female   Osteoarthritis, multiple sites [place] narcotic database reviewed.  Last Norco fill 04/2017.  She is out of the window for withdrawal from opiate medication.  I agree that chronic narcotics should be avoided in this patient given age, lack of severe degenerative changes, and underlying mental health issues.  Encouraged patient to discuss alternative medications vs injection therapies with Dr Sharol Given.  I did not change her Relafen to alternative medication.  I suspect that if her mental health were improved, her  pain might also improve.  Discussed at length with patient that chronic opioids for nonterminal illnesses/ active cancers are no longer recommended.  CDC handout provided to patient today.  Schizoaffective disorder, bipolar type ([place]) I am concerned that patient's illness is not well controlled.  She had a recent hospitalization with [facility]  Her thought was somewhat paranoid and unusual today.  I have highly recommended that she follow up with mental health providers.  I am not concerned for SI or HI and felt that she was safe to go home.  Poor social situation.  It is difficult to discern whether patient's socioeconomic condition is stable or not.  I provided her with a bag of food from our clinic pantry today as well as handouts for loca food pantries.  Patient noted during conversation that she was "not slumming it a the [employer] or shelter", as she was "not a bum that would live somewhere like that".  I will cc: Casimer Lanius, CSW to see if there is something that we can do to help her, though am concerned that she will reject local resources.     [name], DO PGY-3, [facility]

## 2016-07-18 NOTE — Assessment & Plan Note (Signed)
[place] narcotic database reviewed.  Last Norco fill 04/2017.  She is out of the window for withdrawal from opiate medication.  I agree that chronic narcotics should be avoided in this patient given age, lack of severe degenerative changes, and underlying mental health issues.  Encouraged patient to discuss alternative medications vs injection therapies with Dr Sharol Given.  I did not change her Relafen to alternative medication.  I suspect that if her mental health were improved, her pain might also improve.  Discussed at length with patient that chronic opioids for nonterminal illnesses/ active cancers are no longer recommended.  CDC handout provided to patient today.

## 2016-07-18 NOTE — Patient Instructions (Addendum)
As we discussed, medications like Hydrocodone and Oxycodone are falling out of favor for chronic pain that is not cancer/ end of life related.  I agree that you should use medications that area antiinflammatory like the one Dr Sharol Given prescribed.  I recommend that you call his office to schedule the gel shots for your knees.  This will help a great deal.   What You Need to Know About Prescription Opioid Pain Medicine Opioids are powerful medicines that are used to treat moderate to severe pain. Opioids should be taken with the supervision of a trained health care provider. They should be taken for the shortest period of time as possible. This is because opioids can be addictive and the longer you take opioids, the greater your risk of addiction (opioid use disorder). What do opioids do? Opioids help reduce or eliminate pain. When used for short periods of time, they can help you:  Sleep better.  Do better in physical or occupational therapy.  Feel better in the first few days after an injury.  Recover from surgery. What kind of problems can opioids cause? Opioids can cause side effects, such as:  Constipation.  Nausea.  Vomiting.  Drowsiness.  Confusion.  Opioid use disorder.  Breathing difficulties (respiratory depression). Using opioid pain medicines for longer than 3 days increases your risk of these side effects. Taking opioid pain medicine for a long period of time can affect your ability to do daily tasks. It also puts you at risk for:  Car accidents.  Heart attack.  Overdose, which can sometimes lead to death. What can increase my risk for developing problems while taking opioids? You may be at an especially high risk for problems while taking opioids if you:  Are over the age of 110.  Are pregnant.  Have kidney or liver disease.  Have certain mental health conditions, such as depression or anxiety.  Have a history of substance use disorder.  Have had an opioid  overdose in the past. How do I stop taking opioids if I have been taking them for a long time? If you have been taking opioid medicine for more than a few weeks, you may need to slowly stop taking them (taper). Tapering your use of opioids can decrease your chances of experiencing withdrawal symptoms, such as:  Abdominal pain and cramping.  Nausea.  Sweating.  Sleepiness.  Restlessness.  Uncontrollable shaking (tremors).  Cravings for the medicine. Do not attempt to taper your use of opioids on your own. Talk with your health care provider about how to do this. Your health care provider may prescribe a step-down schedule based on how much medicine you are taking and how long you have been taking it. What are the benefits of stopping the use of opioids? By switching from opioid pain medicine to non-opioid pain management options, you will decrease your risk of accidents and injuries associated with long-term opioid use. You will also be able to:  Monitor your pain more accurately and know when to seek medical care if it is not improving.  Decrease risk to others around you. Having opioids in the home increases the risk for accidental or intentional use or overdose by others. How can I treat pain without opioids? Pain can be managed with many types of alternative treatments. Ask your health care provider to refer you to one or more specialists who can help you manage pain through:  Physical or occupational therapy.  Counseling (cognitive-behavioral therapy).  Good nutrition.  Biofeedback.  Massage.  Meditation.  Non-opioid medicine.  Following a gentle exercise program. Where can I get support? If you have been taking opioids for a long time, you may benefit from receiving support for quitting from a local support group or counselor. Ask your health care provider for a referral to these resources in your area. When should I seek medical care? Seek medical care right away if  you are taking opioids and you experience any of the following:  Difficulty breathing.  Breathing that is more shallow or slower than normal.  A very slow heartbeat (pulse).  Severe confusion.  Unconsciousness.  Sleepiness.  Difficulty waking from sleep.  Slurred speech.  Nausea and vomiting.  Cold, clammy skin.  Blue lips or fingernails.  Limpness.  Abnormally small pupils. If you think that you or someone else may have taken too much of an opioid medicine, get medical help right away. Do not wait to see if the symptoms go away on their own. Call your local emergency services (911 in the [place] or call the hotline of the [facility] (939)107-9916 in the [place].).  Where can I get more information? To learn more about opioid medicines, visit the Centers for Disease Control and Prevention web site Opioid Basics at https://keller-santana.com/. Summary  Opioid medicines can help you manage moderate-to-severe pain for a short period of time.  Taking opioid pain medicine for a long period of time puts you at risk for unintentional accidents, injury, and even death.  If you think that you or someone else may have taken too much of an opioid, get medical help right away. This information is not intended to replace advice given to you by your health care provider. Make sure you discuss any questions you have with your health care provider. Document Released: 07/17/2015 Document Revised: 02/12/2016 Document Reviewed: 01/30/2015 Elsevier Interactive Patient Education  2017 [employer].

## 2016-07-18 NOTE — Assessment & Plan Note (Signed)
I am concerned that patient's illness is not well controlled.  She had a recent hospitalization with [facility].  Her thought was somewhat paranoid and unusual today.  I have highly recommended that she follow up with mental health providers.  I am not concerned for SI or HI and felt that she was safe to go home.

## 2016-07-22 ENCOUNTER — Telehealth (INDEPENDENT_AMBULATORY_CARE_PROVIDER_SITE_OTHER): Payer: Self-pay | Admitting: Orthopedic Surgery

## 2016-07-22 ENCOUNTER — Other Ambulatory Visit: Payer: Self-pay | Admitting: Family Medicine

## 2016-07-22 DIAGNOSIS — I10 Essential (primary) hypertension: Secondary | ICD-10-CM

## 2016-07-22 DIAGNOSIS — E785 Hyperlipidemia, unspecified: Secondary | ICD-10-CM

## 2016-07-22 MED ORDER — LEVOTHYROXINE SODIUM 50 MCG PO TABS: 50.0000 ug | ORAL_TABLET | Freq: Every day | ORAL | 3 refills | Status: DC

## 2016-07-22 MED ORDER — BECLOMETHASONE DIPROPIONATE 80 MCG/ACT IN AERS: 1.0000 | INHALATION_SPRAY | Freq: Two times a day (BID) | RESPIRATORY_TRACT | 12 refills | Status: DC

## 2016-07-22 MED ORDER — PRAVASTATIN SODIUM 40 MG PO TABS: 40.0000 mg | ORAL_TABLET | Freq: Every evening | ORAL | 4 refills | Status: DC

## 2016-07-22 MED ORDER — AMLODIPINE BESYLATE 5 MG PO TABS: 5.0000 mg | ORAL_TABLET | Freq: Every day | ORAL | 4 refills | Status: DC

## 2016-07-22 MED ORDER — OMEPRAZOLE 20 MG PO CPDR: 20.0000 mg | DELAYED_RELEASE_CAPSULE | Freq: Every day | ORAL | 1 refills | Status: DC

## 2016-07-22 NOTE — Telephone Encounter (Signed)
I called the pt and asked if she was taking the relafen that Dr. Sharol Given had given her. She states that she is but that it is not helpingher chronic pain. That she has chronic pain issues and she feels like she has not been examined properly or had her concerns taken seriously and that she was going to "fire" our office because we do not seem interested in trying to help her pain. The pt states that she want [place] to "forget out me" and disconnected the phone.

## 2016-07-22 NOTE — Telephone Encounter (Signed)
Needs refill on omeprazole, synthroid, Proventil inhaler, qval inhaler, pravastatin, norvasc.  [place]

## 2016-07-28 ENCOUNTER — Other Ambulatory Visit: Payer: Self-pay | Admitting: Cardiovascular Disease

## 2016-07-28 ENCOUNTER — Other Ambulatory Visit: Payer: Self-pay | Admitting: Family Medicine

## 2016-07-28 DIAGNOSIS — Z1231 Encounter for screening mammogram for malignant neoplasm of breast: Secondary | ICD-10-CM

## 2016-08-15 ENCOUNTER — Ambulatory Visit (INDEPENDENT_AMBULATORY_CARE_PROVIDER_SITE_OTHER): Payer: Medicare Other

## 2016-08-15 ENCOUNTER — Encounter (INDEPENDENT_AMBULATORY_CARE_PROVIDER_SITE_OTHER): Payer: Self-pay | Admitting: Orthopedic Surgery

## 2016-08-15 ENCOUNTER — Ambulatory Visit (INDEPENDENT_AMBULATORY_CARE_PROVIDER_SITE_OTHER): Payer: Medicare Other | Admitting: Orthopedic Surgery

## 2016-08-15 DIAGNOSIS — M7541 Impingement syndrome of right shoulder: Secondary | ICD-10-CM | POA: Insufficient documentation

## 2016-08-15 DIAGNOSIS — M545 Low back pain, unspecified: Secondary | ICD-10-CM | POA: Insufficient documentation

## 2016-08-15 DIAGNOSIS — I87322 Chronic venous hypertension (idiopathic) with inflammation of left lower extremity: Secondary | ICD-10-CM | POA: Diagnosis not present

## 2016-08-15 DIAGNOSIS — G8929 Other chronic pain: Secondary | ICD-10-CM | POA: Insufficient documentation

## 2016-08-15 MED ORDER — METHYLPREDNISOLONE ACETATE 40 MG/ML IJ SUSP
40.0000 mg | INTRAMUSCULAR | Status: AC | PRN
  Administered 2016-08-15: 40 mg via INTRA_ARTICULAR

## 2016-08-15 MED ORDER — LIDOCAINE HCL 1 % IJ SOLN
5.0000 mL | INTRAMUSCULAR | Status: AC | PRN
  Administered 2016-08-15: 5 mL

## 2016-08-15 NOTE — Progress Notes (Signed)
Office Visit Note   Patient: [name] Long           Date of Birth: 06/22/1951           MRN: [id number] Visit Date: 08/15/2016              Requested by: [name], DO [address], [place] [address] PCP: Ronnie Doss, DO  Chief Complaint  Patient presents with  . Lower Back - Pain    HPI: Patient is a 66 y.o female who presents for follow up today for chronic venous stasis insufficiency, and chronic lower back pain. Patient is a poor historian. She has history of mental health problems, and recent hospitalization 06/2016. She was discharged from [facility] orthopedics due to conduct.  She was recommended to get medical compression stockings at last office visit. She advised her son was unable to locate [facility] supply and therefore unable to obtain. She complains of chronic lower back pain today. She states she is unable to take relafen, she feels this caused her drowsiness and has discontinued medication. She states she injured her back while lifting 300lb patient in 1970's. She reports another injury in December of last year, she states she does not want to discuss the back story of this today.   She has also seen her medical doctor who encouraged patient to discuss alternative medications vs injection therapies. She ambulates with cane today. Maxcine Ham, RT   Patient states that her back is doing better she states that she is having recurrent pain from the impingement of the right shoulder and is having recurrent arthritic pain in both knees and that hyaluronic acid has helped quite well in the past. Assessment & Plan: Visit Diagnoses:  1. 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 Chronic midline low back pain without sciatica     Plan: Right shoulder was injected from the subacromial space posteriorly without complications. We will request authorization for hyaluronic acid injection for  both knees.  Follow-Up Instructions: No Follow-up on file.   Ortho Exam Examination patient is alert oriented no adenopathy well-dressed normal affect normal respiratory effort she has normal gait she has no sciatic tension signs on either side she has good motor strength in all motor groups of both lower extremities. Both are tender to palpation and crepitation with range of motion classic cruciates are stable. Examination of right shoulder she has abduction and flexion of only 70 she has internal/external rotation of only about 20 she has pain with Neer and Hawkins impingement test pain with drop arm test.  Imaging: Xr Lumbar Spine 2-3 Views  Result Date: 08/15/2016 Two-view radiographs lumbar spine shows advanced degenerative disc disease throughout the lumbar spine with a grade 1 spondylolisthesis at L4-5   Orders:  Orders Placed This Encounter  Procedures  . XR Lumbar Spine 2-3 Views   No orders of the defined types were placed in this encounter.    Procedures: Large Joint Inj Date/Time: 08/15/2016 2:06 PM Performed by: DUDA, MARCUS V Authorized by: Newt Minion   Consent Given by:  Patient Site marked: the procedure site was marked   Timeout: prior to procedure the correct patient, procedure, and site was verified   Indications:  Pain and diagnostic evaluation Location:  Shoulder Site:  R subacromial bursa Prep: patient was prepped and draped in usual sterile fashion   Needle Size:  22 G Needle Length:  1.5 inches Approach:  Posterior Ultrasound Guidance: No   Fluoroscopic Guidance: No   Arthrogram: No   Medications:  5 mL lidocaine 1 %; 40 mg methylPREDNISolone acetate 40 MG/ML Aspiration Attempted: No   Patient tolerance:  Patient tolerated the procedure well with no immediate complications    Clinical Data: No additional findings.  Subjective: Review of Systems  Objective: Vital Signs: There were no vitals taken for this visit.  Specialty Comments:    No specialty comments available.  PMFS History: Patient Active Problem List   Diagnosis Date Noted  . Schizoaffective disorder, bipolar type ([place])   . URI (upper respiratory infection) 03/09/2016  . Muscle strain of chest wall 10/21/2015  . Activities of daily living deficit involving total body bathing 08/10/2015  . Vitamin D deficiency 12/19/2014  . Preventative health care 03/21/2014  . Bronchitis, chronic obstructive w acute bronchitis ([place]) 01/07/2014  . Osteoarthritis, multiple sites 06/08/2011  . Vaginal discharge 01/14/2011  . COPD (chronic obstructive pulmonary disease) ([place]) 09/23/2010  . CHRONIC KIDNEY DISEASE STAGE II ([place]) 09/14/2009  . Hypothyroidism 08/31/2006  . Type 2 diabetes, diet controlled ([place]) 08/31/2006  . HYPERCHOLESTEROLEMIA 08/31/2006  . Tobacco use disorder 08/31/2006  . HYPERTENSION, BENIGN SYSTEMIC 08/31/2006   Past Medical History:  Diagnosis Date  . BIPOLAR DISORDER 08/31/2006   Qualifier: Diagnosis of  By: Dorathy Daft MD, Marjory Lies    . CHRONIC KIDNEY DISEASE STAGE II ([facility]) 09/14/2009   Annotation: eGFR 64 Qualifier: Diagnosis of  By: Jess Barters MD, [name]    . COPD (chronic obstructive pulmonary disease) ([place]) 09/23/2010   Diagnosed at [facility] in 2008 (Dr. Annamaria Boots)   . DM (diabetes mellitus) type II controlled with renal manifestation ([place]) 08/31/2006   Qualifier: Diagnosis of  By: Dorathy Daft MD, Marjory Lies    . HYPERCHOLESTEROLEMIA 08/31/2006   Intolerance to Lipitor OK on Crestor but medicaid no longer covering    . HYPERTENSION, BENIGN SYSTEMIC 08/31/2006   Qualifier: Diagnosis of  By: Dorathy Daft MD, Marjory Lies    . HYPOTHYROIDISM, UNSPECIFIED 08/31/2006   Qualifier: Diagnosis of  By: Dorathy Daft MD, Marjory Lies      History reviewed. No pertinent family history.  Past Surgical History:  Procedure Laterality Date  . CESAREAN SECTION    . FOOT SURGERY    . KNEE SURGERY Bilateral    Social History   Occupational History  . Not on file.   Social History Main Topics   . Smoking status: Current Every Day Smoker    Packs/day: 0.50    Years: 39.00    Types: Cigarettes  . Smokeless tobacco: Never Used  . Alcohol use Yes  . Drug use: No  . Sexual activity: Not on file

## 2016-08-19 ENCOUNTER — Telehealth (INDEPENDENT_AMBULATORY_CARE_PROVIDER_SITE_OTHER): Payer: Self-pay | Admitting: Orthopaedic Surgery

## 2016-08-19 NOTE — Telephone Encounter (Signed)
Sharol Given patient called late Friday afternoon, holding for you all Monday, thanks.

## 2016-08-19 NOTE — Telephone Encounter (Signed)
PT STATED SHE NEEDS A CALL BACK REGARDING HER PAIN, STATED SHE NEEDS HER HYDROCODONE BECAUSE THE PAIN IS SO BAD. ALSO STATED, "THE STATE DOESN'T HAVE ANYTHING TO DO WITH ME GETTING MY HYDROCODONE."  PLEASE ADVISE   [phone]

## 2016-08-22 ENCOUNTER — Other Ambulatory Visit (INDEPENDENT_AMBULATORY_CARE_PROVIDER_SITE_OTHER): Payer: Self-pay

## 2016-08-22 MED ORDER — NABUMETONE 750 MG PO TABS: 750.0000 mg | ORAL_TABLET | Freq: Every day | ORAL | 3 refills | Status: DC

## 2016-08-22 NOTE — Telephone Encounter (Signed)
I called pt and per Dr. Sharol Given to fax in rx for relafen 750 mg bid. Pt voiced understanding and advised me to send [facility] to the [facility] on [address].

## 2016-08-25 ENCOUNTER — Telehealth (INDEPENDENT_AMBULATORY_CARE_PROVIDER_SITE_OTHER): Payer: Self-pay | Admitting: *Deleted

## 2016-08-25 NOTE — Telephone Encounter (Signed)
I called pt and advised that we did not write rx for Vicodin that the last rx that was given to her was from Dr. Conception Chancy 08/09/16. Pt sates that she wil come back to the office for her bilateral knee injections and shoulder injections but " because Dr. Sharol Given doesn't care about my pain and doesn't care about me having medicine that will help with that I am not coming back to see him any more" I advised that we do care about her being in pain and the pt states "just give him the message" and disconnected the call.

## 2016-08-25 NOTE — Telephone Encounter (Signed)
Patient called in this afternoon in regards to needing a prescription refill on her Hydrocodone. She stated it was sent to the wrong pharmacy. It is supposed to be going to [facility] on [address]. Her CB #[phone].

## 2016-08-30 ENCOUNTER — Telehealth (INDEPENDENT_AMBULATORY_CARE_PROVIDER_SITE_OTHER): Payer: Self-pay | Admitting: *Deleted

## 2016-08-30 ENCOUNTER — Telehealth (INDEPENDENT_AMBULATORY_CARE_PROVIDER_SITE_OTHER): Payer: Self-pay | Admitting: Orthopedic Surgery

## 2016-08-30 DIAGNOSIS — H5713 Ocular pain, bilateral: Secondary | ICD-10-CM | POA: Diagnosis not present

## 2016-08-30 NOTE — Telephone Encounter (Signed)
I called and left voicemail advised Dr. Sharol Given has never written her for hydrocodone. The symptoms she is describing are new. We are more than happy to see her in the office to address her problems, she will need an office visit, only her shoulder and back were addressed at last office visit.

## 2016-08-30 NOTE — Telephone Encounter (Signed)
Patient called needing a refill on hydrocodone 5.325mg . Also is still in a lot of pain in her shoulder and her hand is trembling. She experiencing numbing sensations in her wrist and around her ankles/knees. CB # [phone] [facility]

## 2016-08-30 NOTE — Telephone Encounter (Signed)
ERROR

## 2016-08-31 DIAGNOSIS — R0602 Shortness of breath: Secondary | ICD-10-CM | POA: Diagnosis not present

## 2016-08-31 DIAGNOSIS — R072 Precordial pain: Secondary | ICD-10-CM | POA: Diagnosis not present

## 2016-08-31 DIAGNOSIS — I10 Essential (primary) hypertension: Secondary | ICD-10-CM | POA: Diagnosis not present

## 2016-08-31 DIAGNOSIS — F410 Panic disorder [episodic paroxysmal anxiety] without agoraphobia: Secondary | ICD-10-CM | POA: Diagnosis not present

## 2016-09-07 DIAGNOSIS — F209 Schizophrenia, unspecified: Secondary | ICD-10-CM | POA: Diagnosis not present

## 2016-09-12 ENCOUNTER — Ambulatory Visit (INDEPENDENT_AMBULATORY_CARE_PROVIDER_SITE_OTHER): Payer: Medicare Other | Admitting: Orthopedic Surgery

## 2016-09-19 ENCOUNTER — Telehealth: Payer: Self-pay | Admitting: *Deleted

## 2016-09-19 NOTE — Telephone Encounter (Signed)
Prior Authorization received from [employer] for Qvar 80 mcg. PA completed online at www.covermymeds.com. PA is pending per [facility].  Derl Barrow, RN

## 2016-09-26 ENCOUNTER — Other Ambulatory Visit: Payer: Self-pay | Admitting: Family Medicine

## 2016-09-26 MED ORDER — FLUTICASONE PROPIONATE HFA 110 MCG/ACT IN AERO: 1.0000 | INHALATION_SPRAY | Freq: Two times a day (BID) | RESPIRATORY_TRACT | 12 refills | Status: DC

## 2016-09-26 NOTE — Telephone Encounter (Signed)
Have not received response from [facility] for Qvar inhaler PA.  Qvar will no longer be made. Preferred covered drugs are Symbicort and Breo.  Please advise.  Derl Barrow, RN

## 2016-09-26 NOTE — Telephone Encounter (Signed)
Replaced Qvar with Flovent HFA 110

## 2016-09-29 ENCOUNTER — Telehealth: Payer: Self-pay | Admitting: Family Medicine

## 2016-09-29 DIAGNOSIS — I340 Nonrheumatic mitral (valve) insufficiency: Secondary | ICD-10-CM | POA: Diagnosis not present

## 2016-09-29 DIAGNOSIS — R072 Precordial pain: Secondary | ICD-10-CM | POA: Diagnosis not present

## 2016-09-29 DIAGNOSIS — R0602 Shortness of breath: Secondary | ICD-10-CM | POA: Diagnosis not present

## 2016-09-29 DIAGNOSIS — I425 Other restrictive cardiomyopathy: Secondary | ICD-10-CM | POA: Diagnosis not present

## 2016-09-29 NOTE — Telephone Encounter (Signed)
Pt called about a lot of different things. She is complaining about [facility] banning her from their practice. Dr. Sharol Given wont listen to her and all she needs is to have gel shots or something for the pain. She would like to speak to her doctor or anyone who is not against her since no one wants to listen or help her. She was all over the place in this conversation. jw

## 2016-10-03 NOTE — Telephone Encounter (Signed)
Pt is calling back and would like to speak to her doctor about her calling Dr. Sharol Given and explaining what her issues are since no one listens to what her real problems are. [name] Long

## 2016-10-03 NOTE — Telephone Encounter (Signed)
Called patient and discussed her concerns.  She reports that she thinks she was injected with "all of the medications she is allergic to".  She voices discontent with the care she is receiving at her orthopedic's office.  We reviewed that the injection she received was indeed lidocaine w/ steroid, which is appropriate, and reviewed the last telephone notes from Dr Jess Barters office which demonstrated that she was advised to follow up for further evaluation.  She became combative at this point and threatened to sue her orthopedic and myself and stated that we "are all fired and will never work in the [place] again."  I offered to let her speak to an Glass blower/designer to further address her concerns and she promptly hung up.

## 2016-10-10 ENCOUNTER — Ambulatory Visit: Payer: Self-pay | Admitting: Student

## 2016-10-10 NOTE — Telephone Encounter (Signed)
Attempted to call patient at number listed.  Unfortunately, no answer.  I highly recommend that she come in for an appointment if she is concerned about an ear infection.  Calling in an antibiotic without knowing if she has a true infection and/ or if the ear is the etiology of her symptoms would be a disservice to her.  Please have her reschedule with SDA for an appointment.

## 2016-10-10 NOTE — Telephone Encounter (Signed)
Pt says she has an inner ear infection. She had an appt today at 1:30 but she couldn't get here because there were people "blocking the cab stand". She says she is in a lot of pain with her ear and needs an antibiotic called in ASA[facility].  Pharmacy is [facility] on [address].

## 2016-10-12 ENCOUNTER — Telehealth (INDEPENDENT_AMBULATORY_CARE_PROVIDER_SITE_OTHER): Payer: Self-pay | Admitting: Orthopedic Surgery

## 2016-10-12 NOTE — Telephone Encounter (Signed)
Patient called triage asking to speak with me. I spoke with patient, she seems a little confused. She believes Dr. Sharol Given gave her an injection of everything she is allergic to. She states she is suppose to have cortisone. I asked her what she believed to have gotten. She states an injection of all her allergies including codeine, lisinopril all these medications she is allergic to combined..She states she is tired of our office telling stories about her hydrocodone she has seen other people in our office get hydrocodone and does not understand she cannot get this medication she has been trying since January. She states her shoulder is jumping around. She states her back also hurts but she is not going to get a shot for this. I made an appointment for her to come in office tomorrow with MD appointment 145pm. She will call us if she is unable to get transportation. She also told me 'She doesn't want Mr. Beverely Low playing with her'.

## 2016-10-12 NOTE — Telephone Encounter (Signed)
Patient called asked if Christianne Dolin would call her because she is still in pain and have been in pain since she had the injection in her shoulder and X-ray on her back. Patient asked if Dr Sharol Given would call and talk to her today.  The number to contact patient is [phone]

## 2016-10-13 ENCOUNTER — Ambulatory Visit (INDEPENDENT_AMBULATORY_CARE_PROVIDER_SITE_OTHER): Payer: Medicare Other | Admitting: Orthopedic Surgery

## 2016-10-13 ENCOUNTER — Encounter (INDEPENDENT_AMBULATORY_CARE_PROVIDER_SITE_OTHER): Payer: Self-pay | Admitting: Orthopedic Surgery

## 2016-10-13 DIAGNOSIS — M7541 Impingement syndrome of right shoulder: Secondary | ICD-10-CM

## 2016-10-13 NOTE — Telephone Encounter (Signed)
ERROR
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
## 2016-10-14 ENCOUNTER — Ambulatory Visit: Payer: Medicare Other | Admitting: Family Medicine

## 2016-10-14 NOTE — Telephone Encounter (Signed)
Pt scheduled for an appointment for this today. Katharina Caper, April D, [place]

## 2016-10-15 ENCOUNTER — Emergency Department (HOSPITAL_COMMUNITY)
Admission: EM | Admit: 2016-10-15 | Discharge: 2016-10-15 | Disposition: A | Discharge status: Home / Self Care | Payer: Medicare Other | Attending: Emergency Medicine | Admitting: Emergency Medicine

## 2016-10-15 DIAGNOSIS — Z7982 Long term (current) use of aspirin: Secondary | ICD-10-CM | POA: Diagnosis not present

## 2016-10-15 DIAGNOSIS — J449 Chronic obstructive pulmonary disease, unspecified: Secondary | ICD-10-CM | POA: Diagnosis not present

## 2016-10-15 DIAGNOSIS — I129 Hypertensive chronic kidney disease with stage 1 through stage 4 chronic kidney disease, or unspecified chronic kidney disease: Secondary | ICD-10-CM | POA: Insufficient documentation

## 2016-10-15 DIAGNOSIS — E1122 Type 2 diabetes mellitus with diabetic chronic kidney disease: Secondary | ICD-10-CM | POA: Diagnosis not present

## 2016-10-15 DIAGNOSIS — M79605 Pain in left leg: Secondary | ICD-10-CM | POA: Insufficient documentation

## 2016-10-15 DIAGNOSIS — N182 Chronic kidney disease, stage 2 (mild): Secondary | ICD-10-CM | POA: Diagnosis not present

## 2016-10-15 DIAGNOSIS — M25512 Pain in left shoulder: Secondary | ICD-10-CM

## 2016-10-15 DIAGNOSIS — F17210 Nicotine dependence, cigarettes, uncomplicated: Secondary | ICD-10-CM | POA: Diagnosis not present

## 2016-10-15 DIAGNOSIS — E039 Hypothyroidism, unspecified: Secondary | ICD-10-CM | POA: Diagnosis not present

## 2016-10-15 DIAGNOSIS — R251 Tremor, unspecified: Secondary | ICD-10-CM | POA: Insufficient documentation

## 2016-10-15 NOTE — ED Notes (Signed)
EDP at bedside  

## 2016-10-15 NOTE — ED Notes (Signed)
Pt left before discharge paperwork, pt instructed to await discharge instructions, pt states "it's all right, y'all didn't do nothing" and walked out of department.
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
## 2016-10-15 NOTE — Discharge Instructions (Signed)
Please follow up with family doctor or your orthopedics specialist if you continue to have pain.

## 2016-10-15 NOTE — ED Triage Notes (Signed)
Pt reports she had a shot at [facility] orthopedic three weeks ago for shoulder pain. Pt reports her arms have been shaking since then. Pt has been seen again in the office since the shot. They have seen her arms shaking. Pt here today for evaluation of bilateral shoulder pain.  Pt was supposed to be seen at family practice yesterday but missed her appointment. Pt states she also wants her ears checked out. Pt also concerned about her arms shaking.

## 2016-10-21 ENCOUNTER — Ambulatory Visit: Payer: Self-pay | Admitting: Family Medicine

## 2016-10-27 ENCOUNTER — Ambulatory Visit: Payer: Self-pay | Admitting: Family Medicine

## 2016-10-27 ENCOUNTER — Telehealth: Payer: Self-pay | Admitting: Family Medicine

## 2016-10-27 NOTE — Progress Notes (Deleted)
   Subjective:   [name] Long is a 66 y.o. female with a history of *** here for same day appt for No chief complaint on file.    ***  Review of Systems:  Per HPI.   Social History: *** smoker  Objective:  There were no vitals taken for this visit.  Gen:  66 y.o. female in NAD *** HEENT: NCAT, MMM, EOMI, PERRL, anicteric sclerae CV: RRR, no MRG, no JVD Resp: Non-labored, CTAB, no wheezes noted Abd: Soft, NTND, BS present, no guarding or organomegaly Ext: WWP, no edema MSK: Full ROM, strength intact Neuro: Alert and oriented, speech normal       Chemistry      Component Value Date/Time   NA 139 06/10/2016 0610   K 4.5 06/24/2016 0720   CL 97 (L) 06/10/2016 0610   CO2 31 06/10/2016 0610   BUN <5 (L) 06/10/2016 0610   CREATININE 0.82 06/10/2016 0610   CREATININE 0.76 02/02/2016 1606      Component Value Date/Time   CALCIUM 8.8 (L) 06/10/2016 0610   ALKPHOS 81 06/10/2016 0145   AST 21 06/10/2016 0145   ALT 16 06/10/2016 0145   BILITOT 0.4 06/10/2016 0145      Lab Results  Component Value Date   WBC 6.6 06/10/2016   HGB 12.1 06/10/2016   HCT 34.1 (L) 06/10/2016   MCV 100.0 06/10/2016   PLT 230 06/10/2016   Lab Results  Component Value Date   TSH 0.466 06/10/2016   Lab Results  Component Value Date   HGBA1C 6.3 (H) 06/10/2016   Assessment & Plan:     [name] Long is a 66 y.o. female here for ***  No problem-specific Assessment & Plan notes found for this encounter.     Virginia Crews, MD MPH PGY-3,  [facility] 10/27/2016  8:28 AM

## 2016-10-27 NOTE — Telephone Encounter (Signed)
Pt came into office for an appt today but was 30 minutes late and could not be seen.  She has severe sharp pain in the left ear lobe and goes over to her right ear. She says it is an inner ear infection. She would like some ear drops.  She uses [facility] on [address].

## 2016-10-27 NOTE — Telephone Encounter (Signed)
Patient needs to be seen before any prescriptions will be given.  [name] Long. Jerline Pain, [place] Resident PGY-3 10/27/2016 1:33 PM

## 2016-10-28 ENCOUNTER — Ambulatory Visit
Admission: RE | Admit: 2016-10-28 | Discharge: 2016-10-28 | Disposition: A | Discharge status: Home / Self Care | Payer: Medicare Other | Source: Ambulatory Visit | Attending: Family Medicine | Admitting: Family Medicine

## 2016-10-28 DIAGNOSIS — Z1231 Encounter for screening mammogram for malignant neoplasm of breast: Secondary | ICD-10-CM | POA: Diagnosis not present

## 2016-10-28 NOTE — Telephone Encounter (Signed)
PT informed and appointment was scheduled on the day she missed her appointment due to being late. Katharina Caper, April D, [place]

## 2016-10-30 ENCOUNTER — Emergency Department (HOSPITAL_COMMUNITY)
Admission: EM | Admit: 2016-10-30 | Discharge: 2016-10-31 | Disposition: A | Discharge status: Other Acute Inpatient Hospital | Payer: Medicare Other | Attending: Emergency Medicine | Admitting: Emergency Medicine

## 2016-10-30 ENCOUNTER — Encounter (HOSPITAL_COMMUNITY): Payer: Self-pay

## 2016-10-30 DIAGNOSIS — Z79899 Other long term (current) drug therapy: Secondary | ICD-10-CM | POA: Insufficient documentation

## 2016-10-30 DIAGNOSIS — Z7982 Long term (current) use of aspirin: Secondary | ICD-10-CM | POA: Insufficient documentation

## 2016-10-30 DIAGNOSIS — E1122 Type 2 diabetes mellitus with diabetic chronic kidney disease: Secondary | ICD-10-CM | POA: Diagnosis not present

## 2016-10-30 DIAGNOSIS — E876 Hypokalemia: Secondary | ICD-10-CM | POA: Diagnosis not present

## 2016-10-30 DIAGNOSIS — E039 Hypothyroidism, unspecified: Secondary | ICD-10-CM | POA: Diagnosis not present

## 2016-10-30 DIAGNOSIS — F17210 Nicotine dependence, cigarettes, uncomplicated: Secondary | ICD-10-CM | POA: Insufficient documentation

## 2016-10-30 DIAGNOSIS — F250 Schizoaffective disorder, bipolar type: Secondary | ICD-10-CM | POA: Diagnosis not present

## 2016-10-30 DIAGNOSIS — F259 Schizoaffective disorder, unspecified: Secondary | ICD-10-CM | POA: Diagnosis present

## 2016-10-30 DIAGNOSIS — N182 Chronic kidney disease, stage 2 (mild): Secondary | ICD-10-CM | POA: Insufficient documentation

## 2016-10-30 DIAGNOSIS — J449 Chronic obstructive pulmonary disease, unspecified: Secondary | ICD-10-CM | POA: Diagnosis not present

## 2016-10-30 DIAGNOSIS — R443 Hallucinations, unspecified: Secondary | ICD-10-CM | POA: Diagnosis present

## 2016-10-30 DIAGNOSIS — I129 Hypertensive chronic kidney disease with stage 1 through stage 4 chronic kidney disease, or unspecified chronic kidney disease: Secondary | ICD-10-CM | POA: Diagnosis not present

## 2016-10-30 DIAGNOSIS — R441 Visual hallucinations: Secondary | ICD-10-CM | POA: Diagnosis not present

## 2016-10-30 LAB — CBC WITH DIFFERENTIAL/PLATELET
Basophils Absolute: 0 10*3/uL (ref 0.0–0.1)
Basophils Relative: 0 %
Eosinophils Absolute: 0.1 10*3/uL (ref 0.0–0.7)
Eosinophils Relative: 1 %
HCT: 37.1 % (ref 36.0–46.0)
Hemoglobin: 12.6 g/dL (ref 12.0–15.0)
Lymphocytes Relative: 53 %
Lymphs Abs: 2.9 10*3/uL (ref 0.7–4.0)
MCH: 34.6 pg — ABNORMAL HIGH (ref 26.0–34.0)
MCHC: 34 g/dL (ref 30.0–36.0)
MCV: 101.9 fL — ABNORMAL HIGH (ref 78.0–100.0)
Monocytes Absolute: 0.5 10*3/uL (ref 0.1–1.0)
Monocytes Relative: 9 %
Neutro Abs: 2.1 10*3/uL (ref 1.7–7.7)
Neutrophils Relative %: 37 %
Platelets: 156 10*3/uL (ref 150–400)
RBC: 3.64 MIL/uL — ABNORMAL LOW (ref 3.87–5.11)
RDW: 14.8 % (ref 11.5–15.5)
WBC: 5.6 10*3/uL (ref 4.0–10.5)

## 2016-10-30 LAB — VALPROIC ACID LEVEL: Valproic Acid Lvl: 43 ug/mL — ABNORMAL LOW (ref 50.0–100.0)

## 2016-10-30 LAB — COMPREHENSIVE METABOLIC PANEL
ALT: 15 U/L (ref 14–54)
AST: 17 U/L (ref 15–41)
Albumin: 3.2 g/dL — ABNORMAL LOW (ref 3.5–5.0)
Alkaline Phosphatase: 65 U/L (ref 38–126)
Anion gap: 7 (ref 5–15)
BUN: 11 mg/dL (ref 6–20)
CO2: 34 mmol/L — ABNORMAL HIGH (ref 22–32)
Calcium: 8.9 mg/dL (ref 8.9–10.3)
Chloride: 97 mmol/L — ABNORMAL LOW (ref 101–111)
Creatinine, Ser: 0.98 mg/dL (ref 0.44–1.00)
GFR calc Af Amer: >60 mL/min (ref >60)
GFR calc non Af Amer: 59 mL/min — ABNORMAL LOW (ref >60)
Glucose, Bld: 165 mg/dL — ABNORMAL HIGH (ref 65–99)
Potassium: 2.6 mmol/L — CL (ref 3.5–5.1)
Sodium: 138 mmol/L (ref 135–145)
Total Bilirubin: 0.2 mg/dL — ABNORMAL LOW (ref 0.3–1.2)
Total Protein: 6.7 g/dL (ref 6.5–8.1)

## 2016-10-30 LAB — ETHANOL: Alcohol, Ethyl (B): <5 mg/dL (ref <5)

## 2016-10-30 MED ORDER — DIVALPROEX SODIUM ER 500 MG PO TB24: 500.0000 mg | ORAL_TABLET | Freq: Four times a day (QID) | ORAL | Status: DC

## 2016-10-30 MED ORDER — AMLODIPINE BESYLATE 5 MG PO TABS: 5.0000 mg | ORAL_TABLET | Freq: Every day | ORAL | Status: DC

## 2016-10-30 MED ORDER — FLUTICASONE PROPIONATE 50 MCG/ACT NA SUSP: 2.0000 | Freq: Every day | NASAL | Status: DC

## 2016-10-30 MED ORDER — HYDROCHLOROTHIAZIDE 25 MG PO TABS
25.0000 mg | ORAL_TABLET | Freq: Every day | ORAL | Status: DC
  Administered 2016-10-31: 25 mg via ORAL
  Filled 2016-10-30 (×2): qty 1

## 2016-10-30 MED ORDER — POTASSIUM CHLORIDE CRYS ER 20 MEQ PO TBCR
40.0000 meq | EXTENDED_RELEASE_TABLET | Freq: Once | ORAL | Status: AC
  Administered 2016-10-30: 40 meq via ORAL
  Filled 2016-10-30: qty 2

## 2016-10-30 MED ORDER — ALBUTEROL SULFATE HFA 108 (90 BASE) MCG/ACT IN AERS: 2.0000 | INHALATION_SPRAY | RESPIRATORY_TRACT | Status: DC | PRN

## 2016-10-30 MED ORDER — BECLOMETHASONE DIPROPIONATE 80 MCG/ACT IN AERS: 1.0000 | INHALATION_SPRAY | Freq: Two times a day (BID) | RESPIRATORY_TRACT | Status: DC

## 2016-10-30 MED ORDER — ACETAMINOPHEN 325 MG PO TABS: 650.0000 mg | ORAL_TABLET | ORAL | Status: DC | PRN

## 2016-10-30 MED ORDER — ASPIRIN 81 MG PO CHEW: 81.0000 mg | CHEWABLE_TABLET | Freq: Every day | ORAL | Status: DC

## 2016-10-30 MED ORDER — DIVALPROEX SODIUM 500 MG PO DR TAB: 500.0000 mg | DELAYED_RELEASE_TABLET | Freq: Three times a day (TID) | ORAL | Status: DC

## 2016-10-30 MED ORDER — ZOLPIDEM TARTRATE 5 MG PO TABS: 5.0000 mg | ORAL_TABLET | Freq: Every evening | ORAL | Status: DC | PRN

## 2016-10-30 MED ORDER — NABUMETONE 750 MG PO TABS: 750.0000 mg | ORAL_TABLET | Freq: Every day | ORAL | Status: DC

## 2016-10-30 MED ORDER — BUDESONIDE 0.25 MG/2ML IN SUSP
0.2500 mg | Freq: Two times a day (BID) | RESPIRATORY_TRACT | Status: DC
  Administered 2016-10-31: 0.25 mg via RESPIRATORY_TRACT
  Filled 2016-10-30 (×3): qty 2

## 2016-10-30 MED ORDER — PRAVASTATIN SODIUM 40 MG PO TABS
40.0000 mg | ORAL_TABLET | Freq: Every evening | ORAL | Status: DC
  Filled 2016-10-30 (×2): qty 1

## 2016-10-30 MED ORDER — DOCUSATE SODIUM 100 MG PO CAPS
100.0000 mg | ORAL_CAPSULE | Freq: Two times a day (BID) | ORAL | Status: DC
  Filled 2016-10-30: qty 1

## 2016-10-30 MED ORDER — FLUTICASONE PROPIONATE HFA 110 MCG/ACT IN AERO: 1.0000 | INHALATION_SPRAY | Freq: Two times a day (BID) | RESPIRATORY_TRACT | Status: DC

## 2016-10-30 MED ORDER — TEMAZEPAM 15 MG PO CAPS: 30.0000 mg | ORAL_CAPSULE | Freq: Every day | ORAL | Status: DC

## 2016-10-30 MED ORDER — OLOPATADINE HCL 0.1 % OP SOLN: 1.0000 [drp] | Freq: Two times a day (BID) | OPHTHALMIC | Status: DC

## 2016-10-30 MED ORDER — PREDNISOLONE ACETATE 1 % OP SUSP
1.0000 [drp] | Freq: Every day | OPHTHALMIC | Status: DC
  Filled 2016-10-30: qty 1

## 2016-10-30 MED ORDER — RISPERIDONE 1 MG PO TABS: 1.0000 mg | ORAL_TABLET | Freq: Two times a day (BID) | ORAL | Status: DC

## 2016-10-30 MED ORDER — LORAZEPAM 2 MG/ML IJ SOLN
2.0000 mg | Freq: Once | INTRAMUSCULAR | Status: AC
  Administered 2016-10-30: 2 mg via INTRAMUSCULAR
  Filled 2016-10-30: qty 1

## 2016-10-30 MED ORDER — LORAZEPAM 1 MG PO TABS
1.0000 mg | ORAL_TABLET | Freq: Three times a day (TID) | ORAL | Status: DC | PRN
  Administered 2016-10-31: 1 mg via ORAL
  Filled 2016-10-30: qty 1

## 2016-10-30 MED ORDER — PANTOPRAZOLE SODIUM 40 MG PO TBEC: 40.0000 mg | DELAYED_RELEASE_TABLET | Freq: Every day | ORAL | Status: DC

## 2016-10-30 MED ORDER — LEVOTHYROXINE SODIUM 50 MCG PO TABS
50.0000 ug | ORAL_TABLET | Freq: Every day | ORAL | Status: DC
  Administered 2016-10-31: 50 ug via ORAL
  Filled 2016-10-30: qty 1

## 2016-10-30 MED ORDER — ONDANSETRON HCL 4 MG PO TABS: 4.0000 mg | ORAL_TABLET | Freq: Three times a day (TID) | ORAL | Status: DC | PRN
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
## 2016-10-30 NOTE — ED Notes (Signed)
Refused blood draw and all medications.
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
## 2016-10-30 NOTE — ED Notes (Signed)
[name] Long - Patient daughter reports that patient was IVC'd back in December because she was off her meds and was treated for hypokalemia. Hallucinations increasing getting worse. States that [facility] is not helping patient. States that patient has lost her home with section [employer].

## 2016-10-30 NOTE — ED Notes (Signed)
Rondi Ivy (daughter) [phone]

## 2016-10-30 NOTE — ED Notes (Signed)
Patient assisted in getting dressed in paper scrubs; belongings placed in locker. Patient cursing staff while getting dressed and appears paranoid and guarded. Pt demanding to speak to a Engineer, structural. Officer in room to talk with patient and answer questions. Pt given IM medication as ordered by provider by day shift RN. Pt remains sitting on the side of the bed; no signs of distress noted.
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
## 2016-10-30 NOTE — BHH Counselor (Signed)
Pt's referral was faxed to the following inpatient facilities:    [facility]  [facility]   TTS will continue to follow up and seek placement.    Edd Fabian, MS, [facility], [facility] Triage Specialist [phone]

## 2016-10-30 NOTE — ED Provider Notes (Addendum)
[facility] DEPT Provider Note   CSN: [id number] Arrival date & time: 10/30/16  1625     History   Chief Complaint Chief Complaint  Patient presents with  . IVC    HPI [name] Long is a 66 y.o. female.  HPI  A LEVEL 5 CAVEAT PERTAINS DUE TO PSYCHOSIS.  Pt with hx of bipolar disorder is brought in by her daughter with concern for worsening mental condition.  She is having hallucinations of people throwing darts into her ears, she feels there is a metal piece in her ear.   Daughter states she is exhibiting paranoid behavior, thinks people are trying to steal her husband and her identity,  Is not sleeping well, tries to leave the house at night, has been aggressive.    Pt has a fixed delusion today that there is a piece of metal in her left ear.    Past Medical History:  Diagnosis Date  . BIPOLAR DISORDER 08/31/2006   Qualifier: Diagnosis of  By: Dorathy Daft MD, Marjory Lies    . CHRONIC KIDNEY DISEASE STAGE II (MILD) 09/14/2009   Annotation: eGFR 64 Qualifier: Diagnosis of  By: Jess Barters MD, [name]    . COPD (chronic obstructive pulmonary disease) ([place]) 09/23/2010   Diagnosed at [facility] in 2008 (Dr. Annamaria Boots)   . DM (diabetes mellitus) type II controlled with renal manifestation ([place]) 08/31/2006   Qualifier: Diagnosis of  By: Dorathy Daft MD, Marjory Lies    . HYPERCHOLESTEROLEMIA 08/31/2006   Intolerance to Lipitor OK on Crestor but medicaid no longer covering    . HYPERTENSION, BENIGN SYSTEMIC 08/31/2006   Qualifier: Diagnosis of  By: Dorathy Daft MD, Marjory Lies    . HYPOTHYROIDISM, UNSPECIFIED 08/31/2006   Qualifier: Diagnosis of  By: Dorathy Daft MD, Marjory Lies      Patient Active Problem List   Diagnosis Date Noted  . Chronic midline low back pain without sciatica 08/15/2016  . 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 Impingement syndrome of right shoulder 08/15/2016  . Schizoaffective disorder, bipolar type ([place])   . URI (upper respiratory infection)  03/09/2016  . Muscle strain of chest wall 10/21/2015  . Activities of daily living deficit involving total body bathing 08/10/2015  . Vitamin D deficiency 12/19/2014  . Preventative health care 03/21/2014  . Bronchitis, chronic obstructive w acute bronchitis ([place]) 01/07/2014  . Osteoarthritis, multiple sites 06/08/2011  . Vaginal discharge 01/14/2011  . COPD (chronic obstructive pulmonary disease) ([place]) 09/23/2010  . CHRONIC KIDNEY DISEASE STAGE II (MILD) 09/14/2009  . Hypothyroidism 08/31/2006  . Type 2 diabetes, diet controlled ([place]) 08/31/2006  . HYPERCHOLESTEROLEMIA 08/31/2006  . Tobacco use disorder 08/31/2006  . HYPERTENSION, BENIGN SYSTEMIC 08/31/2006    Past Surgical History:  Procedure Laterality Date  . CESAREAN SECTION    . FOOT SURGERY    . KNEE SURGERY Bilateral     OB History    No data available       Home Medications    Prior to Admission medications   Medication Sig Start Date End Date Taking? Authorizing Provider  albuterol (PROVENTIL HFA) 108 (90 Base) MCG/ACT inhaler Inhale 2 puffs into the lungs every 4 (four) hours as needed. For wheezing 05/08/16  Yes Smiley Houseman, MD  amLODipine (NORVASC) 5 MG tablet Take 1 tablet (5 mg total) by mouth daily. 07/22/16  Yes [name], DO  aspirin ([employer] ASPIRIN) 81 MG chewable tablet Chew 81 mg by mouth daily.     Yes Historical Provider, MD  beclomethasone (QVAR) 80 MCG/ACT inhaler Inhale 1 puff into the lungs 2 (two) times daily. 07/22/16  Yes [name], DO  DEPAKOTE ER 500 MG 24 hr tablet Take 500 mg by mouth 4 (four) times daily. 09/07/16  Yes Historical Provider, MD  hydrochlorothiazide (HYDRODIURIL) 25 MG tablet Take 25 mg by mouth daily. 08/30/16  Yes Historical Provider, MD  levothyroxine (SYNTHROID) 50 MCG tablet Take 1 tablet (50 mcg total) by mouth daily before breakfast. 07/22/16  Yes [name], DO  LORazepam (ATIVAN) 0.5 MG tablet Take 1 tablet (0.5 mg total) by mouth 2  (two) times daily. Patient taking differently: Take 0.5 mg by mouth 2 (two) times daily as needed for sleep.  06/24/16  Yes Jolanta B Pucilowska, MD  nitroGLYCERIN (NITROSTAT) 0.4 MG SL tablet Place 1 tablet (0.4 mg total) under the tongue every 5 (five) minutes as needed for chest pain. 02/26/15  Yes [name], DO  omeprazole (PRILOSEC) 20 MG capsule Take 1 capsule (20 mg total) by mouth daily. 07/22/16  Yes [name], DO  pravastatin (PRAVACHOL) 40 MG tablet Take 1 tablet (40 mg total) by mouth every evening. 07/22/16  Yes [name], DO  prednisoLONE acetate (PRED FORTE) 1 % ophthalmic suspension Place 1 drop into both eyes daily.   Yes Historical Provider, MD  risperiDONE (RISPERDAL) 1 MG tablet Take 1 tablet (1 mg total) by mouth 2 (two) times daily. Patient taking differently: Take 1 mg by mouth at bedtime as needed (for restless leg).  06/24/16  Yes Jolanta B Pucilowska, MD  divalproex (DEPAKOTE) 500 MG DR tablet Take 1 tablet (500 mg total) by mouth every 8 (eight) hours. Patient not taking: Reported on 10/15/2016 06/24/16   Clovis Fredrickson, MD  docusate sodium (COLACE) 100 MG capsule Take 1 capsule (100 mg total) by mouth every 12 (twelve) hours. Patient not taking: Reported on 10/15/2016 05/28/16   Dorie Rank, MD  fluticasone [facility]) 50 MCG/ACT nasal spray Place 2 sprays into both nostrils daily. Patient not taking: Reported on 10/15/2016 12/21/15   [name], DO  fluticasone (FLOVENT HFA) 110 MCG/ACT inhaler Inhale 1 puff into the lungs 2 (two) times daily. Patient not taking: Reported on 10/15/2016 09/26/16   [name], DO  glucose blood (ACCU-CHEK AVIVA PLUS) test strip Use as instructed Patient not taking: Reported on 10/15/2016 04/21/15   [name], DO  ibuprofen (ADVIL,MOTRIN) 800 MG tablet Take 1 tablet (800 mg total) by mouth 3 (three) times daily. Patient not taking: Reported on 10/15/2016 05/28/16   Dorie Rank, MD  Lancets  (ACCU-CHEK MULTICLIX) lancets TEST BLOOD SUGAR 1 TO 3 TIMES PER WEEK Patient not taking: Reported on 10/15/2016 01/20/16   [name], DO  nabumetone (RELAFEN) 750 MG tablet Take 1 tablet (750 mg total) by mouth daily. Patient not taking: Reported on 10/15/2016 08/22/16   Newt Minion, MD  olopatadine (PATANOL) 0.1 % ophthalmic solution Place 1 drop into both eyes 2 (two) times daily. Patient not taking: Reported on 10/15/2016 05/17/16   Smiley Houseman, MD  polyethylene glycol [facility]) packet Take 17 g by mouth daily. Patient not taking: Reported on 10/15/2016 05/28/16   Dorie Rank, MD  temazepam (RESTORIL) 30 MG capsule Take 1 capsule (30 mg total) by mouth at bedtime. Patient not taking: Reported on 10/15/2016 06/24/16   Clovis Fredrickson, MD    Family History Family History  Problem Relation Age of Onset  . Breast cancer Sister  Social History Social History  Substance Use Topics  . Smoking status: Current Every Day Smoker    Packs/day: 0.50    Years: 39.00    Types: Cigarettes  . Smokeless tobacco: Never Used  . Alcohol use Yes     Allergies   Ibuprofen; Lipitor [atorvastatin calcium]; Lisinopril; and Codeine   Review of Systems Review of Systems  UNABLE TO OBTAIN ROS DUE TO LEVEL 5 CAVEAT   Physical Exam Updated Vital Signs BP 136/84 (BP Location: Left Arm)   Pulse 81   Temp 98.1 F (36.7 C) (Oral)   Resp 16   Ht '5\' 3"'  (1.6 m)   Wt 160 lb (72.6 kg)   SpO2 98%   BMI 28.34 kg/m  Vitals reviewed Physical Exam Physical Examination: General appearance - alert, well appearing, and in no distress Mental status - alert, oriented to person, place, and time Eyes - no conjunctival injection, no scleral icterus Ears- TMS clear bilaterally, EACs clear bilaterally, no foreign body Chest - clear to auscultation, no wheezes, rales or rhonchi, symmetric air entry Heart - normal rate, regular rhythm, normal S1, S2, no murmurs, rubs, clicks or gallops Abdomen  - soft, nontender, nondistended, no masses or organomegaly Neurological - alert, oriented, normal speech Extremities - peripheral pulses normal, no pedal edema, no clubbing or cyanosis Skin - normal coloration and turgor, no rashes, no suspicious skin lesions noted Psych- paranoid, intermittently hostile and pleasant, fixed delusion about piece of metal in her ear  ED Treatments / Results  Labs (all labs ordered are listed, but only abnormal results are displayed) Labs Reviewed  COMPREHENSIVE METABOLIC PANEL - Abnormal; Notable for the following:       Result Value   Potassium 2.6 (*)    Chloride 97 (*)    CO2 34 (*)    Glucose, Bld 165 (*)    Albumin 3.2 (*)    Total Bilirubin 0.2 (*)    GFR calc non Af Amer 59 (*)    All other components within normal limits  CBC WITH DIFFERENTIAL/PLATELET - Abnormal; Notable for the following:    RBC 3.64 (*)    MCV 101.9 (*)    MCH 34.6 (*)    All other components within normal limits  VALPROIC ACID LEVEL - Abnormal; Notable for the following:    Valproic Acid Lvl 43 (*)    All other components within normal limits  ETHANOL  RAPID URINE DRUG SCREEN, HOSP PERFORMED    EKG  EKG Interpretation None       Radiology No results found.  Procedures Procedures (including critical care time)  Medications Ordered in ED Medications  LORazepam (ATIVAN) tablet 1 mg (not administered)  acetaminophen (TYLENOL) tablet 650 mg (not administered)  ondansetron (ZOFRAN) tablet 4 mg (not administered)  zolpidem (AMBIEN) tablet 5 mg (not administered)  amLODipine (NORVASC) tablet 5 mg (not administered)  albuterol (PROVENTIL HFA;VENTOLIN HFA) 108 (90 Base) MCG/ACT inhaler 2 puff (not administered)  aspirin chewable tablet 81 mg (not administered)  divalproex (DEPAKOTE ER) 24 hr tablet 500 mg (500 mg Oral Refused 10/30/16 2202)  docusate sodium (COLACE) capsule 100 mg (not administered)  hydrochlorothiazide (HYDRODIURIL) tablet 25 mg (not  administered)  levothyroxine (SYNTHROID, LEVOTHROID) tablet 50 mcg (not administered)  pantoprazole (PROTONIX) EC tablet 40 mg (not administered)  pravastatin (PRAVACHOL) tablet 40 mg (not administered)  prednisoLONE acetate (PRED FORTE) 1 % ophthalmic suspension 1 drop (1 drop Both Eyes Not Given 10/30/16 2312)  risperiDONE (RISPERDAL) tablet 1 mg (1  mg Oral Refused 10/30/16 2202)  temazepam (RESTORIL) capsule 30 mg (30 mg Oral Refused 10/30/16 2202)  budesonide (PULMICORT) nebulizer solution 0.25 mg (0.25 mg Nebulization Not Given 10/30/16 2311)  LORazepam (ATIVAN) injection 2 mg (2 mg Intramuscular Given 10/30/16 1902)  potassium chloride SA (K-DUR,KLOR-CON) CR tablet 40 mEq (40 mEq Oral Given 10/30/16 2140)  potassium chloride SA (K-DUR,KLOR-CON) CR tablet 40 mEq (40 mEq Oral Given 10/30/16 2359)     Initial Impression / Assessment and Plan / ED Course  I have reviewed the triage vital signs and the nursing notes.  Pertinent labs & imaging results that were available during my care of the patient were reviewed by me and considered in my medical decision making (see chart for details).    8:41 PM TTS has seen patient and is going to refer her to geripsych beds.   IVC paperwork completed, psych holding orders written, home meds ordered.    Pt found to have hypokalemia- this was repleted po x 2 over several hours- repeat BMP ordered for the AM- this will need to be checked and repleted as necessary. While placeemnt is being sought.    Final Clinical Impressions(s) / ED Diagnoses   Final diagnoses:  Hallucination  Hypokalemia    New Prescriptions New Prescriptions   No medications on file     Alfonzo Beers, MD 10/31/16 0036    Alfonzo Beers, MD 10/31/16 [phone]

## 2016-10-30 NOTE — ED Notes (Signed)
TTS in room to assess patient at this time.

## 2016-10-31 DIAGNOSIS — I129 Hypertensive chronic kidney disease with stage 1 through stage 4 chronic kidney disease, or unspecified chronic kidney disease: Secondary | ICD-10-CM | POA: Diagnosis not present

## 2016-10-31 DIAGNOSIS — F250 Schizoaffective disorder, bipolar type: Secondary | ICD-10-CM | POA: Diagnosis not present

## 2016-10-31 DIAGNOSIS — F319 Bipolar disorder, unspecified: Secondary | ICD-10-CM | POA: Diagnosis not present

## 2016-10-31 DIAGNOSIS — F17210 Nicotine dependence, cigarettes, uncomplicated: Secondary | ICD-10-CM

## 2016-10-31 DIAGNOSIS — G309 Alzheimer's disease, unspecified: Secondary | ICD-10-CM | POA: Diagnosis not present

## 2016-10-31 DIAGNOSIS — E039 Hypothyroidism, unspecified: Secondary | ICD-10-CM | POA: Diagnosis not present

## 2016-10-31 DIAGNOSIS — E1122 Type 2 diabetes mellitus with diabetic chronic kidney disease: Secondary | ICD-10-CM | POA: Diagnosis not present

## 2016-10-31 DIAGNOSIS — F0151 Vascular dementia with behavioral disturbance: Secondary | ICD-10-CM | POA: Diagnosis not present

## 2016-10-31 DIAGNOSIS — J449 Chronic obstructive pulmonary disease, unspecified: Secondary | ICD-10-CM | POA: Diagnosis not present

## 2016-10-31 DIAGNOSIS — E876 Hypokalemia: Secondary | ICD-10-CM | POA: Diagnosis not present

## 2016-10-31 DIAGNOSIS — F0281 Dementia in other diseases classified elsewhere with behavioral disturbance: Secondary | ICD-10-CM | POA: Diagnosis not present

## 2016-10-31 DIAGNOSIS — F312 Bipolar disorder, current episode manic severe with psychotic features: Secondary | ICD-10-CM | POA: Diagnosis not present

## 2016-10-31 DIAGNOSIS — N182 Chronic kidney disease, stage 2 (mild): Secondary | ICD-10-CM | POA: Diagnosis not present

## 2016-10-31 MED ORDER — LORAZEPAM 1 MG PO TABS
1.0000 mg | ORAL_TABLET | Freq: Once | ORAL | Status: AC
  Administered 2016-10-31: 1 mg via ORAL
  Filled 2016-10-31: qty 1

## 2016-10-31 MED ORDER — LORAZEPAM 1 MG PO TABS
1.0000 mg | ORAL_TABLET | Freq: Once | ORAL | Status: AC
  Administered 2016-10-31: 1 mg via ORAL
  Filled 2016-10-31 (×2): qty 1

## 2016-10-31 MED ORDER — RISPERIDONE 1 MG PO TABS: 1.0000 mg | ORAL_TABLET | Freq: Two times a day (BID) | ORAL | Status: DC

## 2016-10-31 MED ORDER — HYDROXYZINE HCL 10 MG PO TABS
10.0000 mg | ORAL_TABLET | Freq: Two times a day (BID) | ORAL | Status: DC
  Filled 2016-10-31: qty 1

## 2016-10-31 MED ORDER — DIVALPROEX SODIUM ER 500 MG PO TB24: 500.0000 mg | ORAL_TABLET | Freq: Two times a day (BID) | ORAL | Status: DC

## 2016-10-31 NOTE — ED Notes (Signed)
TTS is in with family giving information on that the patient is going to [facility]. [facility] in [place], [place]

## 2016-10-31 NOTE — ED Notes (Signed)
Patient gave OK to speak with her on her care with her daughter present.  Johnna (tech) was witness to patient giving OK.

## 2016-10-31 NOTE — ED Notes (Signed)
Pt was belligerent with [facility] office staff who arrived for transport.  Pt would not sign or allow DC vitals.

## 2016-10-31 NOTE — BH Assessment (Signed)
[facility] Assessment Progress Note  Per Latrelle Dodrill, TS, [facility] called requesting CMET or BMET results for this pt.  Information was faxed, and at 10:15 this writer called and confirmed that they had received fax.  Decision is pending as of this writing.  Jalene Mullet, [place] Triage Specialist [phone]

## 2016-10-31 NOTE — ED Notes (Signed)
Patient awake, and appears anxious. Pt states "My Daddy is outside and he says I can get the cigarettes now. And I know you are selling my blood so other people can get greatness and hear my thoughts; I know that's what is happening". Pt with disorganized thought process and is making delusional statements.

## 2016-10-31 NOTE — ED Notes (Signed)
RN spoke with [employer] Department regarding transportation to [facility] in [place], [place].  They anticipate picking Pt up at 3pm due to availability of female IT trainer.

## 2016-10-31 NOTE — ED Notes (Signed)
Patient ambulated with cane and assistance to bath room
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
## 2016-10-31 NOTE — ED Notes (Signed)
Pt is beligerent stating that. "I know who you are, you lied to me yesterday".  This RN has not worked with this Pt before.  Pt did give permission for her medical information to be shared with her family who is bedside.

## 2016-10-31 NOTE — BH Assessment (Signed)
[facility] Assessment Progress Note  Per Corena Pilgrim, MD, this pt requires psychiatric hospitalization at this time.  Pt presents under IVC initiated by EDP Alfonzo Beers, MD.  At 10:37 Ivin Booty calls from [facility] to report that pt has been accepted to their facility by Dr Ananias Pilgrim.  Dr Darleene Cleaver concurs with this decision.  Pt's nurse has been notified, and agrees to call report to [phone].  Pt is to be transported via [facility].  Jalene Mullet, [place] Triage Specialist [phone]

## 2016-10-31 NOTE — ED Notes (Signed)
Pt refused all meds except Lorazepam

## 2016-10-31 NOTE — ED Notes (Signed)
Does not want vitals taken at the time

## 2016-11-09 ENCOUNTER — Ambulatory Visit: Payer: Medicare Other | Admitting: Family Medicine

## 2016-11-10 ENCOUNTER — Ambulatory Visit (INDEPENDENT_AMBULATORY_CARE_PROVIDER_SITE_OTHER): Payer: Medicare Other | Admitting: Family Medicine

## 2016-11-10 ENCOUNTER — Encounter: Payer: Self-pay | Admitting: Family Medicine

## 2016-11-10 VITALS — BP 118/78 | HR 77 | Temp 97.6°F | Wt 167.0 lb

## 2016-11-10 DIAGNOSIS — E119 Type 2 diabetes mellitus without complications: Secondary | ICD-10-CM

## 2016-11-10 DIAGNOSIS — H60502 Unspecified acute noninfective otitis externa, left ear: Secondary | ICD-10-CM | POA: Diagnosis not present

## 2016-11-10 DIAGNOSIS — H9203 Otalgia, bilateral: Secondary | ICD-10-CM

## 2016-11-10 LAB — POCT GLYCOSYLATED HEMOGLOBIN (HGB A1C): Hemoglobin A1C: 7.7

## 2016-11-10 MED ORDER — ALBUTEROL SULFATE HFA 108 (90 BASE) MCG/ACT IN AERS: 2.0000 | INHALATION_SPRAY | RESPIRATORY_TRACT | 2 refills | Status: DC | PRN

## 2016-11-10 MED ORDER — NAPROXEN 500 MG PO TABS: 500.0000 mg | ORAL_TABLET | Freq: Two times a day (BID) | ORAL | 0 refills | Status: DC

## 2016-11-10 MED ORDER — NEOMYCIN-POLYMYXIN-HC 3.5-10000-1 OT SOLN: 4.0000 [drp] | Freq: Four times a day (QID) | OTIC | 0 refills | Status: DC

## 2016-11-10 MED ORDER — OMEPRAZOLE 20 MG PO CPDR: 20.0000 mg | DELAYED_RELEASE_CAPSULE | Freq: Every day | ORAL | 1 refills | Status: DC

## 2016-11-10 NOTE — Patient Instructions (Signed)

## 2016-11-10 NOTE — Progress Notes (Signed)
   Subjective: CC: ear pain ENI:DPOEU O [name] Long is a 66 y.o. female presenting to clinic today for same day appointment. PCP: [name], DO Concerns today include:  1. Ear pain Patient reports that ear pain has been going on intermittently for months.  She reports that she feels like she has a foreign object in her left ear.  Denies fevers, chills, headaches, decreased hearing associated with ear pain.  She has not used anything for pain.  Allergies  Allergen Reactions  . Ibuprofen Other (See Comments)    Unknown  . Lipitor [Atorvastatin Calcium] Other (See Comments)    Body aches  . Lisinopril Other (See Comments)    REACTION: PER DR. Melvyn Novas  . Codeine Rash    Social Hx reviewed. MedHx, current medications and allergies reviewed.  Please see EMR. ROS: Per HPI  Objective: Office vital signs reviewed. BP 118/78   Pulse 77   Temp 97.6 F (36.4 C) (Oral)   Wt 167 lb (75.8 kg)   SpO2 99%   BMI 29.58 kg/m   Physical Examination:  General: Awake, alert, well nourished, No acute distress HEENT: Normal    Neck: No masses palpated. No lymphadenopathy    Ears: Tympanic membranes intact, normal light reflex, no bulging; left external auditory canal with mild inflammation and mild erythema.  Scant flaking of skin appreciated.  No bleeding or exudate.  No tragal or mastoid TTP.    Eyes: PERRLA, EOMI, no conjunctival injection    Nose: nasal turbinates moist, no nasal discharge    Throat: moist mucus membranes, no erythema, no tonsillar exudate.  Airway is Psych: patient continues to exhibit delusional thoughts.  She reports being injected "with everything I am allergic to in my right shoulder at Dr Jess Barters office".  She does not appear to respond to internal stimuli.    Results for orders placed or performed in visit on 11/10/16 (from the past 24 hour(s))  HgB A1c     Status: None   Collection Time: 11/10/16  2:15 PM  Result Value Ref Range   Hemoglobin A1C 7.7      Assessment/ Plan: 66 y.o. female   1. Otalgia of both ears.  Her exam showed very mild external auditory dermatitis w/ possible early external ear infection.  No evidence of AOM.  She had no foreign bodies in her ears and I reiterated this several times to her.    2. Type 2 diabetes, diet controlled ([place]), uncontrolled. - Recommend resuming home medications - HgB A1c  3. Acute otitis externa of left ear, unspecified type - neomycin-polymyxin-hydrocortisone (CORTISPORIN) otic solution; Place 4 drops into the left ear 4 (four) times daily. x10 days  Dispense: 10 mL; Refill: 0  I fear that her current psychiatric state is a barrier to hear healthcare.  She received Respiradone via injection while hospitalized in psychiatric facility recently.  Hopefully, this allows her mind to be clear enough to continue seeking care at [facility] as recommended.  She does not appear to be a danger to herself or others at this time and is safe for discharge home.  I am not sure if her currently living situation is sustainable and feel that she would likely benefit from a nursing/ assisted living facility.  Will discuss [facility] with patient at follow up visit.  Hopefully, we will be able to better focus on her uncontrolled DM2 at that time. [name], DO PGY-3, [facility]

## 2016-11-18 ENCOUNTER — Inpatient Hospital Stay: Payer: Medicare Other | Admitting: Family Medicine

## 2016-11-30 ENCOUNTER — Other Ambulatory Visit: Payer: Self-pay | Admitting: Family Medicine

## 2016-11-30 ENCOUNTER — Telehealth: Payer: Self-pay | Admitting: Family Medicine

## 2016-11-30 ENCOUNTER — Telehealth (INDEPENDENT_AMBULATORY_CARE_PROVIDER_SITE_OTHER): Payer: Self-pay | Admitting: Orthopedic Surgery

## 2016-11-30 DIAGNOSIS — Z789 Other specified health status: Secondary | ICD-10-CM

## 2016-11-30 DIAGNOSIS — R460 Very low level of personal hygiene: Principal | ICD-10-CM

## 2016-11-30 DIAGNOSIS — F17200 Nicotine dependence, unspecified, uncomplicated: Secondary | ICD-10-CM

## 2016-11-30 DIAGNOSIS — E119 Type 2 diabetes mellitus without complications: Secondary | ICD-10-CM

## 2016-11-30 MED ORDER — BLOOD GLUCOSE MONITOR KIT: PACK | 0 refills | Status: DC

## 2016-11-30 MED ORDER — NICOTINE 14 MG/24HR TD PT24: 14.0000 mg | MEDICATED_PATCH | Freq: Every day | TRANSDERMAL | 0 refills | Status: DC

## 2016-11-30 NOTE — Telephone Encounter (Signed)
Done.  No nebulizer rx'd, as she is not prescribed nebulized treatments.

## 2016-11-30 NOTE — Telephone Encounter (Signed)
Pt needs orders for a nebulizer and all equipment that goes with it. Pt also needs a new glucose meter, test strips and lancets, pt needs it to be a brand medicaid will cover. Pt wants orders faxed to [facility] as soon as possible. Fax [phone]. ep

## 2016-11-30 NOTE — Telephone Encounter (Signed)
I called patient to take aleve 2 po BID. She said aleve does not help shes tried this already. She gets no relief from mobic, meloxicam or prednisone. Advised her that there is nothing that can be prescribed for swelling.

## 2016-11-30 NOTE — Telephone Encounter (Signed)
Patient called and said that both ankles and feet are swollen and she was wondering if she could take something to help with the swelling. CB # [phone]

## 2016-11-30 NOTE — Telephone Encounter (Signed)
Done

## 2016-11-30 NOTE — Telephone Encounter (Signed)
Pt also wants a new shower chair

## 2016-11-30 NOTE — Telephone Encounter (Signed)
Also wants something to stop smoking that medicare will pay for

## 2016-12-01 ENCOUNTER — Telehealth: Payer: Self-pay | Admitting: Family Medicine

## 2016-12-01 ENCOUNTER — Telehealth: Payer: Self-pay | Admitting: *Deleted

## 2016-12-01 NOTE — Telephone Encounter (Signed)
Order for shower chair faxed to [facility], and Rx for meter and supplies and patches faxed to [facility] on [address]. Katharina Caper, April D, [place]

## 2016-12-01 NOTE — Telephone Encounter (Signed)
She wants the prescriptions faxed to [facility] [phone]

## 2016-12-01 NOTE — Telephone Encounter (Signed)
Pt says she doesn't need any patches.  She needs a nebulzer machine and equip, blood pressure cuff, blood sugar machine and shower chair.  Please advise

## 2016-12-07 ENCOUNTER — Telehealth: Payer: Self-pay | Admitting: Family Medicine

## 2016-12-07 NOTE — Telephone Encounter (Signed)
LVM for pt daughter to call back to give her the below information. If she calls back please inform her of below. Katharina Caper, April D, [place]

## 2016-12-07 NOTE — Telephone Encounter (Signed)
This was done 5/30.

## 2016-12-07 NOTE — Telephone Encounter (Signed)
Pt's daughter called stating pt was deemed incompetent in court. Daughter needs a letter stating pt is unable to handle her finances.Please call daughter when this is ready to be picked up. ep

## 2016-12-07 NOTE — Telephone Encounter (Signed)
Previous message was entered in error.  Please disregard.  That documentation was intended for another patient.

## 2016-12-07 NOTE — Telephone Encounter (Signed)
Will forward to MD. Jazmin Hartsell,CMA  

## 2016-12-07 NOTE — Telephone Encounter (Signed)
If patient was deemed incompetent in court, then that is the legal documentation patient's daughter needs to gain financial POA if she is designated as the [employer] of her mother.  I am not legally qualified to say she is incompetent and cannot have control of her own finances.  Please inform patient's daughter.

## 2016-12-07 NOTE — Telephone Encounter (Signed)
Patient reports that she thinks her eyes look jaundiced like it did before when she had to have her gallbladder out.  She is worried and wanted to know if she should go to the ED.  We discussed that if she is looking jaundiced, that she should be evaluated.  SDA offered but she notes she will go to ED for evaluation.

## 2016-12-08 NOTE — Telephone Encounter (Signed)
Daughter was advised info below, but she says the court told her she need this note from pt's doctor. Daughter would like April or PCP to give her a call. ep

## 2016-12-09 NOTE — Telephone Encounter (Signed)
Spoke to daughter, Joscelin Fray. Daughter will bring paperwork from court on Monday. Ottis Stain, CMA

## 2016-12-09 NOTE — Telephone Encounter (Signed)
Discussed with Dr Gwendlyn Deutscher.  Please have daughter bring in the paperwork from court noting her incompetence and I should be able to use for documentation.  I have to have a copy of this on file before the letter can be writtent.

## 2016-12-12 NOTE — Telephone Encounter (Signed)
Shelda Pal-  patient's daughter brought court document showing her as guardian of patient.( incompetent person.)  for PCP. Paper scanned to patient's account. Copy placed in white folder.

## 2016-12-13 NOTE — Telephone Encounter (Signed)
Pt states [facility] has not received the orders for her nebulizer machine, nebulizer solution, BP machine, glucose meter. Pt states she needs the nebulizer machine and solution ASAP because she has bronchitis. Pt states she did get the chair. ep

## 2016-12-13 NOTE — Telephone Encounter (Signed)
Form placed in PCP box so she can take care of getting what documentation the daughter is asking for. Katharina Caper, April D, [place]'

## 2016-12-13 NOTE — Telephone Encounter (Signed)
Patient can get the compressor /nebulizer set-up in our office from [place]. Hubbard Hartshorn, RN, BSN

## 2016-12-14 NOTE — Telephone Encounter (Signed)
Once the provider signs for the nebulizer the patient can come to office to pick it up and she sign for it.  Supply is limited.  Derl Barrow, RN

## 2016-12-15 ENCOUNTER — Encounter: Payer: Self-pay | Admitting: Family Medicine

## 2016-12-15 ENCOUNTER — Telehealth (INDEPENDENT_AMBULATORY_CARE_PROVIDER_SITE_OTHER): Payer: Self-pay | Admitting: Orthopedic Surgery

## 2016-12-15 NOTE — Telephone Encounter (Signed)
PT REQUESTED A CALL BACK TO DISCUSS HER MEDS, WANTS A REFILL AN DIS UNSURE WHAT MED.  [phone]

## 2016-12-15 NOTE — Telephone Encounter (Signed)
Contacted pt guardian and gave her the below information and placed letter up front for pick up. Katharina Caper, April D, [place]

## 2016-12-15 NOTE — Telephone Encounter (Signed)
Letter has been completed.  Please call patient's guardian for pick up

## 2016-12-16 ENCOUNTER — Telehealth: Payer: Self-pay | Admitting: Internal Medicine

## 2016-12-16 DIAGNOSIS — E119 Type 2 diabetes mellitus without complications: Secondary | ICD-10-CM

## 2016-12-16 MED ORDER — BLOOD GLUCOSE MONITOR KIT: PACK | 0 refills | Status: DC

## 2016-12-16 MED ORDER — GLUCOSE BLOOD VI STRP: ORAL_STRIP | 12 refills | Status: DC

## 2016-12-16 MED ORDER — ACCU-CHEK MULTICLIX LANCETS MISC: 3 refills | Status: DC

## 2016-12-16 NOTE — Telephone Encounter (Signed)
I tried called and spoke with patients, advising her that for her swelling. Dr.Duda does not prescribed dieretic and needs to follow up with medical doctor. Advised that her medicaid is pending. She told me she never gets to see Dr. Sharol Given that we always make her see a man with long hair and whiskers . Advised her that we will make sure she sees Dr. Sharol Given, she always has, but patient suffers from mental health problems. Abigail Butts is going  To talk to this patient and explain again since she did not believe me.

## 2016-12-16 NOTE — Telephone Encounter (Signed)
IC patient and discussed all, she understands to check with PCP for meds for swelling/diuretic.  I advised her it is Dr Sharol Given that she has seen and we will make sure she sees and understands that it is him when she comes in on 01/11/17.  She says the arm is still shaking and hurts her a lot.  I offered a sooner appt, and she declined due to transportation.

## 2016-12-16 NOTE — Telephone Encounter (Signed)
**  After Hours/ Emergency Line Call*  Received a call to report that [name] Long needs her glucose monitoring supplies sent to [facility] on [address]. She reports she called them and they do not have the prescriptions for her. I attempted to call in Rx because for some reason I cannot e-prescribe. They do not accept verbal orders for medicare patients. I attempted to print out Rx to fax but it does not print out here. Patient is only on Metformin. I told her I will send message to PCP to fax Rxs to pharmacy on Monday. Patient understoon. Will forward to PCP.  Smiley Houseman, MD PGY-2, [facility]

## 2016-12-16 NOTE — Telephone Encounter (Signed)
Upon further chart review noted patient does not have order for nebulized medications and therefore does not need compressor/nebulizer. This was verified by PCP in note on 11/30/2016. Order for new glucometer and supplies was sent to [facility] on [address] by PCP on 11/30/2016.   Called patient to inform her of above. States that she found her compressor/nebulizer and cardiologist has been refilling medication for it. Patient was very appreciative of call. Hubbard Hartshorn, RN, BSN

## 2016-12-19 NOTE — Telephone Encounter (Signed)
Will ask Nehemiah Settle, CMA, to get the fax from 5/30 and resend to pharmacy.

## 2016-12-19 NOTE — Telephone Encounter (Signed)
Faxed Rx at 12:15pm. Conformed pharmacy received. The Rx needs a DWO, form faxed to Dr. To fill out. Ottis Stain, CMA

## 2016-12-20 ENCOUNTER — Telehealth: Payer: Self-pay | Admitting: Family Medicine

## 2016-12-20 NOTE — Telephone Encounter (Signed)
Pt states she needs a refill on a medication for abdominal spasms, says it starts with neo. Pt also needs a glucose meter, strips, and lancets that medicaid/medicare will pay for. Pt uses [facility] on [address]. ep

## 2016-12-20 NOTE — Telephone Encounter (Signed)
She has no medications for abdominal spasms that starts with Neo.  The only Neo was an ear drop.  This would not be an appropriate medication for abdominal spasms.

## 2016-12-21 NOTE — Telephone Encounter (Signed)
Called pharmacy, they will refax the form. Ottis Stain, CMA

## 2016-12-21 NOTE — Telephone Encounter (Signed)
I have not received the DWO form.  Can we call the pharmacy an inquire about this?  Thanks

## 2016-12-22 DIAGNOSIS — F209 Schizophrenia, unspecified: Secondary | ICD-10-CM | POA: Diagnosis not present

## 2016-12-23 ENCOUNTER — Encounter: Payer: Self-pay | Admitting: Family Medicine

## 2016-12-23 NOTE — Progress Notes (Signed)
DWO completed for meter, lancets and strips

## 2016-12-25 ENCOUNTER — Encounter (HOSPITAL_COMMUNITY): Payer: Self-pay

## 2016-12-25 ENCOUNTER — Other Ambulatory Visit: Payer: Self-pay

## 2016-12-25 ENCOUNTER — Inpatient Hospital Stay (HOSPITAL_COMMUNITY)
Admission: EM | Admit: 2016-12-25 | Discharge: 2016-12-26 | DRG: 191 | Disposition: A | Discharge status: Home / Self Care | Payer: Medicare Other | Attending: Family Medicine | Admitting: Family Medicine

## 2016-12-25 ENCOUNTER — Emergency Department (HOSPITAL_COMMUNITY): Payer: Medicare Other

## 2016-12-25 DIAGNOSIS — F17210 Nicotine dependence, cigarettes, uncomplicated: Secondary | ICD-10-CM | POA: Diagnosis present

## 2016-12-25 DIAGNOSIS — E876 Hypokalemia: Secondary | ICD-10-CM | POA: Diagnosis not present

## 2016-12-25 DIAGNOSIS — E1165 Type 2 diabetes mellitus with hyperglycemia: Secondary | ICD-10-CM | POA: Diagnosis present

## 2016-12-25 DIAGNOSIS — Z7951 Long term (current) use of inhaled steroids: Secondary | ICD-10-CM | POA: Diagnosis not present

## 2016-12-25 DIAGNOSIS — Z79899 Other long term (current) drug therapy: Secondary | ICD-10-CM | POA: Diagnosis not present

## 2016-12-25 DIAGNOSIS — J441 Chronic obstructive pulmonary disease with (acute) exacerbation: Secondary | ICD-10-CM | POA: Diagnosis not present

## 2016-12-25 DIAGNOSIS — F0390 Unspecified dementia without behavioral disturbance: Secondary | ICD-10-CM | POA: Diagnosis present

## 2016-12-25 DIAGNOSIS — R609 Edema, unspecified: Secondary | ICD-10-CM | POA: Diagnosis not present

## 2016-12-25 DIAGNOSIS — E785 Hyperlipidemia, unspecified: Secondary | ICD-10-CM | POA: Diagnosis not present

## 2016-12-25 DIAGNOSIS — E039 Hypothyroidism, unspecified: Secondary | ICD-10-CM | POA: Diagnosis present

## 2016-12-25 DIAGNOSIS — E860 Dehydration: Secondary | ICD-10-CM | POA: Diagnosis present

## 2016-12-25 DIAGNOSIS — I129 Hypertensive chronic kidney disease with stage 1 through stage 4 chronic kidney disease, or unspecified chronic kidney disease: Secondary | ICD-10-CM | POA: Diagnosis present

## 2016-12-25 DIAGNOSIS — F259 Schizoaffective disorder, unspecified: Secondary | ICD-10-CM | POA: Diagnosis present

## 2016-12-25 DIAGNOSIS — K219 Gastro-esophageal reflux disease without esophagitis: Secondary | ICD-10-CM | POA: Diagnosis present

## 2016-12-25 DIAGNOSIS — N182 Chronic kidney disease, stage 2 (mild): Secondary | ICD-10-CM | POA: Diagnosis present

## 2016-12-25 DIAGNOSIS — N179 Acute kidney failure, unspecified: Secondary | ICD-10-CM | POA: Diagnosis present

## 2016-12-25 DIAGNOSIS — Z9114 Patient's other noncompliance with medication regimen: Secondary | ICD-10-CM

## 2016-12-25 DIAGNOSIS — F250 Schizoaffective disorder, bipolar type: Secondary | ICD-10-CM | POA: Diagnosis present

## 2016-12-25 DIAGNOSIS — R600 Localized edema: Secondary | ICD-10-CM | POA: Diagnosis not present

## 2016-12-25 DIAGNOSIS — E1122 Type 2 diabetes mellitus with diabetic chronic kidney disease: Secondary | ICD-10-CM | POA: Diagnosis present

## 2016-12-25 DIAGNOSIS — Z7982 Long term (current) use of aspirin: Secondary | ICD-10-CM

## 2016-12-25 DIAGNOSIS — I509 Heart failure, unspecified: Secondary | ICD-10-CM | POA: Insufficient documentation

## 2016-12-25 DIAGNOSIS — Z7984 Long term (current) use of oral hypoglycemic drugs: Secondary | ICD-10-CM

## 2016-12-25 DIAGNOSIS — F22 Delusional disorders: Secondary | ICD-10-CM

## 2016-12-25 DIAGNOSIS — E7800 Pure hypercholesterolemia, unspecified: Secondary | ICD-10-CM | POA: Diagnosis present

## 2016-12-25 DIAGNOSIS — I4891 Unspecified atrial fibrillation: Secondary | ICD-10-CM | POA: Diagnosis present

## 2016-12-25 DIAGNOSIS — R0602 Shortness of breath: Secondary | ICD-10-CM | POA: Diagnosis not present

## 2016-12-25 DIAGNOSIS — D7589 Other specified diseases of blood and blood-forming organs: Secondary | ICD-10-CM | POA: Diagnosis present

## 2016-12-25 DIAGNOSIS — I499 Cardiac arrhythmia, unspecified: Secondary | ICD-10-CM | POA: Diagnosis not present

## 2016-12-25 DIAGNOSIS — R0600 Dyspnea, unspecified: Secondary | ICD-10-CM | POA: Diagnosis not present

## 2016-12-25 LAB — CBC WITH DIFFERENTIAL/PLATELET
Basophils Absolute: 0 10*3/uL (ref 0.0–0.1)
Basophils Relative: 0 %
Eosinophils Absolute: 0.1 10*3/uL (ref 0.0–0.7)
Eosinophils Relative: 2 %
HCT: 38.5 % (ref 36.0–46.0)
Hemoglobin: 12.6 g/dL (ref 12.0–15.0)
Lymphocytes Relative: 43 %
Lymphs Abs: 2.5 10*3/uL (ref 0.7–4.0)
MCH: 34.1 pg — ABNORMAL HIGH (ref 26.0–34.0)
MCHC: 32.7 g/dL (ref 30.0–36.0)
MCV: 104.3 fL — ABNORMAL HIGH (ref 78.0–100.0)
Monocytes Absolute: 0.7 10*3/uL (ref 0.1–1.0)
Monocytes Relative: 12 %
Neutro Abs: 2.4 10*3/uL (ref 1.7–7.7)
Neutrophils Relative %: 43 %
Platelets: 167 10*3/uL (ref 150–400)
RBC: 3.69 MIL/uL — ABNORMAL LOW (ref 3.87–5.11)
RDW: 14.9 % (ref 11.5–15.5)
WBC: 5.7 10*3/uL (ref 4.0–10.5)

## 2016-12-25 LAB — BASIC METABOLIC PANEL
Anion gap: 10 (ref 5–15)
Anion gap: 13 (ref 5–15)
BUN: 8 mg/dL (ref 6–20)
BUN: 12 mg/dL (ref 6–20)
CO2: 30 mmol/L (ref 22–32)
CO2: 27 mmol/L (ref 22–32)
Calcium: 8.7 mg/dL — ABNORMAL LOW (ref 8.9–10.3)
Calcium: 8.9 mg/dL (ref 8.9–10.3)
Chloride: 96 mmol/L — ABNORMAL LOW (ref 101–111)
Chloride: 93 mmol/L — ABNORMAL LOW (ref 101–111)
Creatinine, Ser: 0.91 mg/dL (ref 0.44–1.00)
Creatinine, Ser: 1.26 mg/dL — ABNORMAL HIGH (ref 0.44–1.00)
GFR calc Af Amer: >60 mL/min (ref >60)
GFR calc Af Amer: 51 mL/min — ABNORMAL LOW (ref >60)
GFR calc non Af Amer: >60 mL/min (ref >60)
GFR calc non Af Amer: 44 mL/min — ABNORMAL LOW (ref >60)
Glucose, Bld: 237 mg/dL — ABNORMAL HIGH (ref 65–99)
Glucose, Bld: 461 mg/dL — ABNORMAL HIGH (ref 65–99)
Potassium: 2.9 mmol/L — ABNORMAL LOW (ref 3.5–5.1)
Potassium: 3.5 mmol/L (ref 3.5–5.1)
Sodium: 136 mmol/L (ref 135–145)
Sodium: 133 mmol/L — ABNORMAL LOW (ref 135–145)

## 2016-12-25 LAB — GLUCOSE, CAPILLARY
Glucose-Capillary: 469 mg/dL — ABNORMAL HIGH (ref 65–99)
Glucose-Capillary: 551 mg/dL (ref 65–99)
Glucose-Capillary: 521 mg/dL (ref 65–99)
Glucose-Capillary: 483 mg/dL — ABNORMAL HIGH (ref 65–99)
Glucose-Capillary: 440 mg/dL — ABNORMAL HIGH (ref 65–99)
Glucose-Capillary: 382 mg/dL — ABNORMAL HIGH (ref 65–99)
Glucose-Capillary: 331 mg/dL — ABNORMAL HIGH (ref 65–99)

## 2016-12-25 LAB — URINALYSIS, ROUTINE W REFLEX MICROSCOPIC
Bilirubin Urine: NEGATIVE
Glucose, UA: NEGATIVE mg/dL
Hgb urine dipstick: NEGATIVE
Ketones, ur: NEGATIVE mg/dL
Leukocytes, UA: NEGATIVE
Nitrite: NEGATIVE
Protein, ur: NEGATIVE mg/dL
Specific Gravity, Urine: 1.012 (ref 1.005–1.030)
pH: 6 (ref 5.0–8.0)

## 2016-12-25 LAB — RAPID URINE DRUG SCREEN, HOSP PERFORMED
Amphetamines: NOT DETECTED
Barbiturates: NOT DETECTED
Benzodiazepines: POSITIVE — AB
Cocaine: NOT DETECTED
Opiates: NOT DETECTED
Tetrahydrocannabinol: NOT DETECTED

## 2016-12-25 LAB — MAGNESIUM: Magnesium: 1.7 mg/dL (ref 1.7–2.4)

## 2016-12-25 LAB — TSH: TSH: 0.354 u[IU]/mL (ref 0.350–4.500)

## 2016-12-25 LAB — I-STAT TROPONIN, ED: Troponin i, poc: 0 ng/mL (ref 0.00–0.08)

## 2016-12-25 LAB — VALPROIC ACID LEVEL: Valproic Acid Lvl: 70 ug/mL (ref 50.0–100.0)

## 2016-12-25 LAB — T4, FREE: Free T4: 0.76 ng/dL (ref 0.61–1.12)

## 2016-12-25 LAB — BRAIN NATRIURETIC PEPTIDE: B Natriuretic Peptide: 172.8 pg/mL — ABNORMAL HIGH (ref 0.0–100.0)

## 2016-12-25 LAB — TROPONIN I: Troponin I: <0.03 ng/mL (ref <0.03)

## 2016-12-25 LAB — ETHANOL: Alcohol, Ethyl (B): <5 mg/dL (ref <5)

## 2016-12-25 MED ORDER — INSULIN ASPART 100 UNIT/ML ~~LOC~~ SOLN
10.0000 [IU] | Freq: Once | SUBCUTANEOUS | Status: AC
  Administered 2016-12-25: 10 [IU] via SUBCUTANEOUS

## 2016-12-25 MED ORDER — LEVOFLOXACIN 750 MG PO TABS
750.0000 mg | ORAL_TABLET | Freq: Every day | ORAL | Status: DC
  Administered 2016-12-25: 750 mg via ORAL
  Filled 2016-12-25: qty 1

## 2016-12-25 MED ORDER — INSULIN ASPART 100 UNIT/ML ~~LOC~~ SOLN
0.0000 [IU] | Freq: Three times a day (TID) | SUBCUTANEOUS | Status: DC
  Administered 2016-12-25: 10 [IU] via SUBCUTANEOUS
  Administered 2016-12-26: 9 [IU] via SUBCUTANEOUS

## 2016-12-25 MED ORDER — HALOPERIDOL 5 MG PO TABS
5.0000 mg | ORAL_TABLET | Freq: Two times a day (BID) | ORAL | Status: DC
  Administered 2016-12-25 (×2): 5 mg via ORAL
  Filled 2016-12-25 (×3): qty 1

## 2016-12-25 MED ORDER — FUROSEMIDE 10 MG/ML IJ SOLN
40.0000 mg | Freq: Once | INTRAMUSCULAR | Status: AC
  Administered 2016-12-25: 40 mg via INTRAVENOUS
  Filled 2016-12-25: qty 4

## 2016-12-25 MED ORDER — INSULIN ASPART 100 UNIT/ML ~~LOC~~ SOLN
5.0000 [IU] | Freq: Once | SUBCUTANEOUS | Status: AC
  Administered 2016-12-25: 5 [IU] via SUBCUTANEOUS

## 2016-12-25 MED ORDER — LEVOFLOXACIN 500 MG PO TABS
500.0000 mg | ORAL_TABLET | Freq: Every day | ORAL | Status: DC
  Administered 2016-12-26: 500 mg via ORAL
  Filled 2016-12-25: qty 1

## 2016-12-25 MED ORDER — ALBUTEROL SULFATE (2.5 MG/3ML) 0.083% IN NEBU
INHALATION_SOLUTION | RESPIRATORY_TRACT | Status: AC
  Filled 2016-12-25: qty 6

## 2016-12-25 MED ORDER — ALBUTEROL SULFATE (2.5 MG/3ML) 0.083% IN NEBU
5.0000 mg | INHALATION_SOLUTION | Freq: Once | RESPIRATORY_TRACT | Status: AC
  Administered 2016-12-25: 5 mg via RESPIRATORY_TRACT

## 2016-12-25 MED ORDER — ASPIRIN 81 MG PO CHEW
81.0000 mg | CHEWABLE_TABLET | Freq: Every day | ORAL | Status: DC
  Administered 2016-12-25 – 2016-12-26 (×2): 81 mg via ORAL
  Filled 2016-12-25 (×2): qty 1

## 2016-12-25 MED ORDER — ACETAMINOPHEN 325 MG PO TABS: 650.0000 mg | ORAL_TABLET | Freq: Four times a day (QID) | ORAL | Status: DC | PRN

## 2016-12-25 MED ORDER — IPRATROPIUM BROMIDE 0.02 % IN SOLN
1.0000 mg | Freq: Once | RESPIRATORY_TRACT | Status: AC
  Administered 2016-12-25: 1 mg via RESPIRATORY_TRACT
  Filled 2016-12-25: qty 5

## 2016-12-25 MED ORDER — ACETAMINOPHEN 650 MG RE SUPP: 650.0000 mg | Freq: Four times a day (QID) | RECTAL | Status: DC | PRN

## 2016-12-25 MED ORDER — PRAVASTATIN SODIUM 40 MG PO TABS
40.0000 mg | ORAL_TABLET | Freq: Every evening | ORAL | Status: DC
  Administered 2016-12-25 – 2016-12-26 (×2): 40 mg via ORAL
  Filled 2016-12-25 (×2): qty 1

## 2016-12-25 MED ORDER — IPRATROPIUM-ALBUTEROL 0.5-2.5 (3) MG/3ML IN SOLN
3.0000 mL | RESPIRATORY_TRACT | Status: DC
  Administered 2016-12-25: 3 mL via RESPIRATORY_TRACT
  Filled 2016-12-25: qty 3

## 2016-12-25 MED ORDER — PREDNISONE 50 MG PO TABS
50.0000 mg | ORAL_TABLET | Freq: Every day | ORAL | Status: DC
  Administered 2016-12-26: 50 mg via ORAL
  Filled 2016-12-25: qty 1
  Filled 2016-12-25: qty 3

## 2016-12-25 MED ORDER — DIVALPROEX SODIUM 250 MG PO DR TAB
1000.0000 mg | DELAYED_RELEASE_TABLET | Freq: Two times a day (BID) | ORAL | Status: DC
  Administered 2016-12-25: 500 mg via ORAL
  Administered 2016-12-25 – 2016-12-26 (×3): 1000 mg via ORAL
  Filled 2016-12-25 (×5): qty 4

## 2016-12-25 MED ORDER — LEVOTHYROXINE SODIUM 50 MCG PO TABS
50.0000 ug | ORAL_TABLET | Freq: Every day | ORAL | Status: DC
Start: 2016-12-26 — End: 2016-12-27
  Administered 2016-12-26: 50 ug via ORAL
  Filled 2016-12-25: qty 1

## 2016-12-25 MED ORDER — ALBUTEROL SULFATE (2.5 MG/3ML) 0.083% IN NEBU: 2.5000 mg | INHALATION_SOLUTION | Freq: Two times a day (BID) | RESPIRATORY_TRACT | Status: DC | PRN

## 2016-12-25 MED ORDER — METHYLPREDNISOLONE SODIUM SUCC 125 MG IJ SOLR
125.0000 mg | Freq: Once | INTRAMUSCULAR | Status: AC
  Administered 2016-12-25: 125 mg via INTRAVENOUS
  Filled 2016-12-25: qty 2

## 2016-12-25 MED ORDER — IPRATROPIUM-ALBUTEROL 0.5-2.5 (3) MG/3ML IN SOLN
3.0000 mL | RESPIRATORY_TRACT | Status: DC | PRN
  Administered 2016-12-26 (×2): 3 mL via RESPIRATORY_TRACT
  Filled 2016-12-25 (×2): qty 3

## 2016-12-25 MED ORDER — ENOXAPARIN SODIUM 40 MG/0.4ML ~~LOC~~ SOLN
40.0000 mg | SUBCUTANEOUS | Status: DC
  Filled 2016-12-25: qty 0.4

## 2016-12-25 MED ORDER — ALBUTEROL (5 MG/ML) CONTINUOUS INHALATION SOLN
10.0000 mg/h | INHALATION_SOLUTION | Freq: Once | RESPIRATORY_TRACT | Status: AC
  Administered 2016-12-25: 10 mg/h via RESPIRATORY_TRACT
  Filled 2016-12-25: qty 20

## 2016-12-25 MED ORDER — ALBUTEROL SULFATE HFA 108 (90 BASE) MCG/ACT IN AERS: 2.0000 | INHALATION_SPRAY | Freq: Two times a day (BID) | RESPIRATORY_TRACT | Status: DC | PRN

## 2016-12-25 MED ORDER — PANTOPRAZOLE SODIUM 40 MG PO TBEC
40.0000 mg | DELAYED_RELEASE_TABLET | Freq: Every day | ORAL | Status: DC
  Administered 2016-12-25 – 2016-12-26 (×2): 40 mg via ORAL
  Filled 2016-12-25 (×2): qty 1

## 2016-12-25 MED ORDER — TRIHEXYPHENIDYL HCL 5 MG PO TABS
5.0000 mg | ORAL_TABLET | Freq: Two times a day (BID) | ORAL | Status: DC
  Administered 2016-12-25: 5 mg via ORAL
  Filled 2016-12-25 (×4): qty 1

## 2016-12-25 MED ORDER — MAGNESIUM SULFATE 2 GM/50ML IV SOLN
2.0000 g | Freq: Once | INTRAVENOUS | Status: AC
  Administered 2016-12-25: 2 g via INTRAVENOUS
  Filled 2016-12-25: qty 50

## 2016-12-25 MED ORDER — ALUM & MAG HYDROXIDE-SIMETH 200-200-20 MG/5ML PO SUSP
30.0000 mL | Freq: Four times a day (QID) | ORAL | Status: DC | PRN
  Administered 2016-12-25: 30 mL via ORAL
  Filled 2016-12-25: qty 30

## 2016-12-25 MED ORDER — APIXABAN 5 MG PO TABS
5.0000 mg | ORAL_TABLET | Freq: Two times a day (BID) | ORAL | Status: DC
  Administered 2016-12-25 – 2016-12-26 (×3): 5 mg via ORAL
  Filled 2016-12-25 (×3): qty 1

## 2016-12-25 MED ORDER — RISPERIDONE 1 MG PO TABS
1.0000 mg | ORAL_TABLET | Freq: Two times a day (BID) | ORAL | Status: DC
  Administered 2016-12-25: 1 mg via ORAL
  Filled 2016-12-25: qty 1

## 2016-12-25 MED ORDER — POTASSIUM CHLORIDE CRYS ER 20 MEQ PO TBCR
40.0000 meq | EXTENDED_RELEASE_TABLET | Freq: Once | ORAL | Status: AC
  Administered 2016-12-25: 40 meq via ORAL
  Filled 2016-12-25: qty 2

## 2016-12-25 MED ORDER — METOPROLOL TARTRATE 25 MG PO TABS
25.0000 mg | ORAL_TABLET | Freq: Two times a day (BID) | ORAL | Status: DC
  Administered 2016-12-25 – 2016-12-26 (×2): 25 mg via ORAL
  Filled 2016-12-25 (×3): qty 1

## 2016-12-25 MED ORDER — LORAZEPAM 1 MG PO TABS
1.0000 mg | ORAL_TABLET | Freq: Three times a day (TID) | ORAL | Status: DC
  Administered 2016-12-25 – 2016-12-26 (×6): 1 mg via ORAL
  Filled 2016-12-25 (×6): qty 1

## 2016-12-25 MED ORDER — POTASSIUM CHLORIDE 10 MEQ/100ML IV SOLN
10.0000 meq | INTRAVENOUS | Status: AC
  Administered 2016-12-25 (×2): 10 meq via INTRAVENOUS
  Filled 2016-12-25 (×2): qty 100

## 2016-12-25 MED ORDER — HYDROCODONE-ACETAMINOPHEN 5-325 MG PO TABS
1.0000 | ORAL_TABLET | Freq: Three times a day (TID) | ORAL | Status: DC | PRN
  Administered 2016-12-25: 1 via ORAL
  Filled 2016-12-25: qty 1

## 2016-12-25 MED ORDER — METOPROLOL TARTRATE 5 MG/5ML IV SOLN
2.5000 mg | INTRAVENOUS | Status: AC
  Administered 2016-12-25: 2.5 mg via INTRAVENOUS
  Filled 2016-12-25: qty 5

## 2016-12-25 NOTE — Progress Notes (Signed)
   Called by RN after EKG was done showing patient is in afib with RVR. Went to assess patient. She is walking around room. Denies CP. Endorses some SOB. Irregular rhythm, tachycardic. Lungs CTA. EKG with HR 120's. Chadsvasc score > 2, will start eliquis. Orders written for lopressor per afib protocol, cardiac monitoring. Will follow closely.    Lucila Maine, DO PGY-1, [facility] 12/25/2016 4:54 PM

## 2016-12-25 NOTE — ED Notes (Signed)
Pt upset about having her belongings removed from the room. Pt reports staff has been "tampering with my belongings." Pt now reporting she has a doctorate and that she has a Electrical engineer against one of our nurses for "messing up her vein."

## 2016-12-25 NOTE — H&P (Signed)
[facility] Admission History and Physical Service Pager: [phone]  Patient name: [name] Long Medical record number: [id number] Date of birth: 17-Aug-1950 Age: 66 y.o. Gender: female  Primary Care Provider: [name], DO Consultants: none Code Status: FULL  Chief Complaint: increased work of breathing  Assessment and Plan: [name] Long is a 66 y.o. female presenting with productive cough and dyspnea. PMH is significant for hypothyroidism, DM2, HLD, HTN, CKDII, COPD, Tobacco Use.   Dyspnea with cough and sputum production: likely 2/2 COPD exacerbation. Differentials include ACS, CHF. On room air with stable vitals but lung exam with decreased air movement and coarse breath sounds. CXR negative for PNA. Received CAT, Albuterol 2m, Atrovent x1. istat trop 0. EKG with NSR but some T wave flattening in II, III, AVF and V4, V5, V6 although not the best EKG read.  - admit to telemetry - s/p Solumedrol 1274mthis AM - ordered Prednisone 5038maily starting 6/25 - Duonebs q 4 scheduled, with Albuterol q2 PRN  - PO Levaquin  - repeat EKG - troponin I x 1  LE Swelling:  No calf tenderness, and swelling is symmetric. No diagnosis of CHF in chart. BNP to 172.8 (no prior to compare). CXR without overt fluid overload. Unlikely DVT as swelling is symmetric and no calf tenderness.  - s/p IV Lasix 21m17m1 - strict I/O - daily weights  - ECHO  Hypokalemia: K 2.9 s/p IV K 10mE37m2 and Kdur 21mg 55m Unclear etiology. Chronic issue. Medications that cause hypokalemia: HCTZ and albuterol but should not decrease by this much  - repeat BMP at 12pm  - check magnesium  Schizoaffective DO Bipolar Type: does have symptoms of paranoia and delusions. Per daughter this is her baseline but her symptom have been worsening over the past 2 years. Denies SI/HI or hallucinations. Currently under IVC placed by ED provider.  [facility]  daughter reports patient was diagnosed with mild dementia. Medication reconciliation completed with daughter who is her caretaker and guardian. Of note, Qtc is normal on EKG.  - Lorazepam 1mg TI59m Depakote 1000mg BI22mRisperidone 1mg BID 65mitter at bedside - consult psychiatry   DM2: A1c 7.7 in 11/2016. Home med include Metformin  - holding metformin  - cbgs ACHS  - Sensitive SSI with meals   HTN: currently normotensive.  - holding HCTZ, Norvasc for now.  - monitor BP   HLD: on Pravachol at home. Last lipid on file 02/2016: LDL 81, total cholesterol 156, HDL 49, TAG 128.  - continue Pravachol   Hypothyroid: on synthroid 50mcg dai64m continue home synthroid  - recheck TSH   GERD:  - continue Prilosec  Macrocytosis, Elevated MCV: normal hgb. No folate or Vit b12 in chart - consider checking Folate and Vitamin B12   Tobacco Use: offered nicotine patch but patient declined. Current smoker.    FEN/GI: heart/carb modified  Prophylaxis: Lovenox  Disposition: admit for management of COPD and evaluation for LE edema.   History of Present Illness:  [name] Long.o. fe82le presenting with increased work of breathing and productive cough.   History mainly given by patient's daughter (over phone). Daughter reports that patient has been having shortness of breath for the past two weeks. The symptoms initially got better with her albuterol inhaler but over the last three days symptom returned. Patient was not able to use her nebulizer because she  lost part of the equipment. Due to continued worsening of the breathing the daughter brought the patient to the ED. The patient reports of productive cough with white mucus in the last week. She denies fevers or chills. Denies chest pain or palpitations. No nausea, vomiting, abdominal pain, or diarrhea. Daughter reports of worsening lower extremity swelling bilaterally in the last 3 weeks. No calf pain.   Review Of Systems:   Review  of Systems  Constitutional: Negative for chills and fever.  HENT: Negative for congestion.   Eyes: Negative for blurred vision.  Respiratory: Positive for cough, sputum production and shortness of breath. Negative for hemoptysis.   Cardiovascular: Positive for leg swelling. Negative for chest pain and palpitations.  Gastrointestinal: Negative for abdominal pain, constipation, diarrhea, nausea and vomiting.  Genitourinary: Negative for dysuria, frequency and urgency.  Skin: Negative for rash.  Neurological: Negative for dizziness, focal weakness and headaches.  Psychiatric/Behavioral: Negative for hallucinations, substance abuse and suicidal ideas.    Patient Active Problem List   Diagnosis Date Noted  . Chronic midline low back pain without sciatica 08/15/2016  . 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 Impingement syndrome of right shoulder 08/15/2016  . Schizoaffective disorder, bipolar type ([place])   . URI (upper respiratory infection) 03/09/2016  . Muscle strain of chest wall 10/21/2015  . Activities of daily living deficit involving bathing of lower body 08/10/2015  . Vitamin D deficiency 12/19/2014  . Preventative health care 03/21/2014  . Bronchitis, chronic obstructive w acute bronchitis ([place]) 01/07/2014  . Osteoarthritis, multiple sites 06/08/2011  . Vaginal discharge 01/14/2011  . COPD (chronic obstructive pulmonary disease) ([place]) 09/23/2010  . CHRONIC KIDNEY DISEASE STAGE II ([facility]) 09/14/2009  . Hypothyroidism 08/31/2006  . Type 2 diabetes, diet controlled ([place]) 08/31/2006  . HYPERCHOLESTEROLEMIA 08/31/2006  . Tobacco use disorder 08/31/2006  . HYPERTENSION, BENIGN SYSTEMIC 08/31/2006    Past Medical History: Past Medical History:  Diagnosis Date  . BIPOLAR DISORDER 08/31/2006   Qualifier: Diagnosis of  By: Dorathy Daft MD, Marjory Lies    . CHRONIC KIDNEY DISEASE STAGE II (MILD) 09/14/2009   Annotation: eGFR 64 Qualifier:  Diagnosis of  By: Jess Barters MD, [name]    . COPD (chronic obstructive pulmonary disease) ([place]) 09/23/2010   Diagnosed at [facility] in 2008 (Dr. Annamaria Boots)   . DM (diabetes mellitus) type II controlled with renal manifestation ([place]) 08/31/2006   Qualifier: Diagnosis of  By: Dorathy Daft MD, Marjory Lies    . HYPERCHOLESTEROLEMIA 08/31/2006   Intolerance to Lipitor OK on Crestor but medicaid no longer covering    . HYPERTENSION, BENIGN SYSTEMIC 08/31/2006   Qualifier: Diagnosis of  By: Dorathy Daft MD, Marjory Lies    . HYPOTHYROIDISM, UNSPECIFIED 08/31/2006   Qualifier: Diagnosis of  By: Dorathy Daft MD, Marjory Lies      Past Surgical History: Past Surgical History:  Procedure Laterality Date  . CESAREAN SECTION    . FOOT SURGERY    . KNEE SURGERY Bilateral     Social History: Social History  Substance Use Topics  . Smoking status: Current Every Day Smoker    Packs/day: 0.50    Years: 39.00    Types: Cigarettes  . Smokeless tobacco: Never Used  . Alcohol use Yes   Additional social history:  Please also refer to relevant sections of EMR.  Family History: Family History  Problem Relation Age of Onset  . Breast cancer Sister     Allergies and Medications: Allergies  Allergen Reactions  . Ibuprofen Other (See  Comments)    Unknown  . Lipitor [Atorvastatin Calcium] Other (See Comments)    Body aches  . Lisinopril Other (See Comments)    REACTION: PER DR. Melvyn Novas  . Codeine Rash   No current facility-administered medications on file prior to encounter.    Current Outpatient Prescriptions on File Prior to Encounter  Medication Sig Dispense Refill  . albuterol (PROVENTIL HFA) 108 (90 Base) MCG/ACT inhaler Inhale 2 puffs into the lungs every 4 (four) hours as needed. For wheezing 1 Inhaler 2  . amLODipine (NORVASC) 5 MG tablet Take 1 tablet (5 mg total) by mouth daily. 90 tablet 4  . aspirin (BAYER CHILDRENS ASPIRIN) 81 MG chewable tablet Chew 81 mg by mouth daily.      . blood glucose meter kit and supplies KIT  Dispense based on patient and insurance preference. Use up to four times daily as directed to test blood sugar. Dx E11.9 1 each 0  . DEPAKOTE ER 500 MG 24 hr tablet Take 500 mg by mouth 4 (four) times daily.    . divalproex (DEPAKOTE) 500 MG DR tablet Take 1 tablet (500 mg total) by mouth every 8 (eight) hours. (Patient not taking: Reported on 10/15/2016) 90 tablet 1  . docusate sodium (COLACE) 100 MG capsule Take 1 capsule (100 mg total) by mouth every 12 (twelve) hours. (Patient not taking: Reported on 10/15/2016) 60 capsule 0  . fluticasone (FLONASE) 50 MCG/ACT nasal spray Place 2 sprays into both nostrils daily. (Patient not taking: Reported on 10/15/2016) 16 g 1  . fluticasone (FLOVENT HFA) 110 MCG/ACT inhaler Inhale 1 puff into the lungs 2 (two) times daily. (Patient not taking: Reported on 10/15/2016) 1 Inhaler 12  . glucose blood (ACCU-CHEK AVIVA PLUS) test strip Check sugar 3 times daily. E11.9 100 each 12  . hydrochlorothiazide (HYDRODIURIL) 25 MG tablet Take 25 mg by mouth daily.    . Lancets (ACCU-CHEK MULTICLIX) lancets Check blood sugar 3 times a day. E11.9 102 each 3  . levothyroxine (SYNTHROID) 50 MCG tablet Take 1 tablet (50 mcg total) by mouth daily before breakfast. 90 tablet 3  . LORazepam (ATIVAN) 0.5 MG tablet Take 1 tablet (0.5 mg total) by mouth 2 (two) times daily. (Patient taking differently: Take 0.5 mg by mouth 2 (two) times daily as needed for sleep. ) 60 tablet 0  . metFORMIN (GLUCOPHAGE) 500 MG tablet Take 500 mg by mouth daily with breakfast.  0  . naproxen (NAPROSYN) 500 MG tablet Take 1 tablet (500 mg total) by mouth 2 (two) times daily with a meal. 30 tablet 0  . neomycin-polymyxin-hydrocortisone (CORTISPORIN) otic solution Place 4 drops into the left ear 4 (four) times daily. x10 days 10 mL 0  . nicotine (NICODERM CQ - DOSED IN MG/24 HOURS) 14 mg/24hr patch Place 1 patch (14 mg total) onto the skin daily. 28 patch 0  . nitroGLYCERIN (NITROSTAT) 0.4 MG SL tablet Place 1  tablet (0.4 mg total) under the tongue every 5 (five) minutes as needed for chest pain. 50 tablet 3  . olopatadine (PATANOL) 0.1 % ophthalmic solution Place 1 drop into both eyes 2 (two) times daily. (Patient not taking: Reported on 10/15/2016) 5 mL 2  . omeprazole (PRILOSEC) 20 MG capsule Take 1 capsule (20 mg total) by mouth daily. 90 capsule 1  . polyethylene glycol (MIRALAX) packet Take 17 g by mouth daily. (Patient not taking: Reported on 10/15/2016) 14 each 0  . pravastatin (PRAVACHOL) 40 MG tablet Take 1 tablet (40 mg  total) by mouth every evening. 90 tablet 4  . prednisoLONE acetate (PRED FORTE) 1 % ophthalmic suspension Place 1 drop into both eyes daily.    . risperiDONE (RISPERDAL) 1 MG tablet Take 1 tablet (1 mg total) by mouth 2 (two) times daily. (Patient taking differently: Take 1 mg by mouth at bedtime as needed (for restless leg). ) 60 tablet 1  . temazepam (RESTORIL) 30 MG capsule Take 1 capsule (30 mg total) by mouth at bedtime. (Patient not taking: Reported on 10/15/2016) 30 capsule 0    Objective: BP 103/72   Pulse 74   Temp 98.7 F (37.1 C) (Oral)   Resp (!) 24   SpO2 100%  Exam: General: sitting on the side of the bed eating. Is tremulous, speaking in full sentences   Eyes: small pupils but reactive, clear sclerae ENTM: MMM Neck: no cervical lymphadenopathy, normal thyroid Cardiovascular: Difficult to auscultate over lung sounds. But RRR. Unable to assess for murmurs or rubs  Respiratory: tachypneic. Coarse breath sounds diffusely without wheezing. On room air with normal pulse ox Gastrointestinal: soft, NT, ND MSK: able to move all extremities without difficulty. 5/5 strength in upper and lower extremities bilateraly. Pitting edema in bilateral lower extremities up to knees Derm: no rash Neuro: alert and oriented x 3. Grossly nonfocal. Signs of paranoia and delusions. But able to answer questions appropriately but has tangential speech  Psych: intermittently agitated,  tangential speech   Labs and Imaging: CBC BMET   Recent Labs Lab 12/25/16 0333  WBC 5.7  HGB 12.6  HCT 38.5  PLT 167    Recent Labs Lab 12/25/16 0333  NA 136  K 2.9*  CL 96*  CO2 30  BUN 8  CREATININE 0.91  GLUCOSE 237*  CALCIUM 8.7*     BNP 172 istat trop 0.00 Valproic level 70 UA: negative for infection Alcohol <5  CXR:  FINDINGS: Mild diffuse interstitial coarsening with bibasilar subsegmental atelectatic changes/ scarring. An area of hazy density at the left lung base likely represents atelectasis. Developing infiltrate is less likely but not excluded. Clinical correlation is recommended. There is no pleural effusion or pneumothorax. The cardiac silhouette is within normal limits. There is osteopenia with degenerative changes of the spine. There is dextroscoliosis of the lower thoracic spine. No acute osseous pathology.  IMPRESSION: Left lung base atelectatic changes. Developing infiltrate is less likely. Clinical correlation is recommended.  Smiley Houseman, MD 12/25/2016, 6:31 AM PGY-2, [place] [facility] pager: [phone], text pages welcome

## 2016-12-25 NOTE — Progress Notes (Signed)
Pt has refused troponin Labs explained benefits of MD orders. Assigned Nurse is aware.

## 2016-12-25 NOTE — Progress Notes (Signed)
  CAT started.  

## 2016-12-25 NOTE — Consult Note (Addendum)
[facility] Consult   Reason for Consult:  Delusions Referring Physician:  Dr. Waldon Reining Patient Identification: [name] Long MRN:  [id number] Principal Diagnosis: Schizoaffective disorder, bipolar type [facility]) Diagnosis:   Patient Active Problem List   Diagnosis Date Noted  . Schizoaffective disorder, bipolar type ([place]) [F25.0]     Priority: High  . Shortness of breath [R06.02] 12/25/2016  . Pedal edema [R60.0]   . Acute on chronic congestive heart failure ([place]) [I50.9]   . Irregular heart beat [I49.9]   . Chronic midline low back pain without sciatica [M54.5, G89.29] 08/15/2016  . 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 Impingement syndrome of right shoulder [M75.41] 08/15/2016  . URI (upper respiratory infection) [J06.9] 03/09/2016  . Muscle strain of chest wall [S29.011A] 10/21/2015  . Activities of daily living deficit involving bathing of lower body [R46.0] 08/10/2015  . Hypokalemia [E87.6] 01/22/2015  . Vitamin D deficiency [E55.9] 12/19/2014  . Preventative health care [Z00.00] 03/21/2014  . Bronchitis, chronic obstructive w acute bronchitis ([place]) [J44.0] 01/07/2014  . Osteoarthritis, multiple sites [M15.9] 06/08/2011  . Vaginal discharge [N89.8] 01/14/2011  . COPD exacerbation ([place]) [J44.1] 09/23/2010  . CHRONIC KIDNEY DISEASE STAGE II (MILD) [N18.2] 09/14/2009  . Hypothyroidism [E03.9] 08/31/2006  . Type 2 diabetes, diet controlled ([place]) [E11.9] 08/31/2006  . HYPERCHOLESTEROLEMIA [E78.00] 08/31/2006  . Tobacco use disorder [F17.200] 08/31/2006  . HYPERTENSION, BENIGN SYSTEMIC [I10] 08/31/2006    Total Time spent with patient: 1 hour  Subjective:   [name] Long is a 66 y.o. female patient admitted with shortness of breath and cough.  HPI:  Thanks for asking me to do a psychiatric consultation on [name] Long. Patient with history of  Hypothyroidism, HTN, HLDDM2, CKD II brought in by  her family for worsening SOB and productive cough. Patient is a poor historian but chart review revealed that patient was recently admitted to [facility] for the treatment of psychiatric problem but was also diagnosed with Mild Dementia. Today patient presents with paranoid delusions but her family reports that she is at baseline. Patient states that she was diagnosed with mental illness many years ago and has been taking her medications regularly, she receives medication management from a psychiatrist in [place], [place]. She denies psychosis, depression, SI/HI, drugs and alcohol abuse.    Past Psychiatric History: as above  Risk to Self: Is patient at risk for suicide?: No Risk to Others:   Prior Inpatient Therapy:   Prior Outpatient Therapy:    Past Medical History:  Past Medical History:  Diagnosis Date  . BIPOLAR DISORDER 08/31/2006   Qualifier: Diagnosis of  By: Dorathy Daft MD, Marjory Lies    . CHRONIC KIDNEY DISEASE STAGE II (MILD) 09/14/2009   Annotation: eGFR 64 Qualifier: Diagnosis of  By: Jess Barters MD, [name]    . COPD (chronic obstructive pulmonary disease) ([place]) 09/23/2010   Diagnosed at [facility] in 2008 (Dr. Annamaria Boots)   . DM (diabetes mellitus) type II controlled with renal manifestation ([place]) 08/31/2006   Qualifier: Diagnosis of  By: Dorathy Daft MD, Marjory Lies    . HYPERCHOLESTEROLEMIA 08/31/2006   Intolerance to Lipitor OK on Crestor but medicaid no longer covering    . HYPERTENSION, BENIGN SYSTEMIC 08/31/2006   Qualifier: Diagnosis of  By: Dorathy Daft MD, Marjory Lies    . HYPOTHYROIDISM, UNSPECIFIED 08/31/2006   Qualifier: Diagnosis of  By: Dorathy Daft MD, Marjory Lies      Past Surgical History:  Procedure Laterality Date  . CESAREAN SECTION    .  FOOT SURGERY    . KNEE SURGERY Bilateral    Family History:  Family History  Problem Relation Age of Onset  . Breast cancer Sister    Family Psychiatric  History: Social History:  History  Alcohol Use  . Yes     History  Drug Use No    Social  History   Social History  . Marital status: Divorced    Spouse name: N/A  . Number of children: N/A  . Years of education: N/A   Social History Main Topics  . Smoking status: Current Every Day Smoker    Packs/day: 0.50    Years: 39.00    Types: Cigarettes  . Smokeless tobacco: Never Used  . Alcohol use Yes  . Drug use: No  . Sexual activity: Not Asked   Other Topics Concern  . None   Social History Narrative  . None   Additional Social History:    Allergies:   Allergies  Allergen Reactions  . Ibuprofen Other (See Comments)    Unknown  . Lipitor [Atorvastatin Calcium] Other (See Comments)    Body aches  . Lisinopril Other (See Comments)    REACTION: PER DR. Melvyn Novas  . Codeine Rash    Labs:  Results for orders placed or performed during the hospital encounter of 12/25/16 (from the past 48 hour(s))  CBC with Differential     Status: Abnormal   Collection Time: 12/25/16  3:33 AM  Result Value Ref Range   WBC 5.7 4.0 - 10.5 K/uL   RBC 3.69 (L) 3.87 - 5.11 MIL/uL   Hemoglobin 12.6 12.0 - 15.0 g/dL   HCT 38.5 36.0 - 46.0 %   MCV 104.3 (H) 78.0 - 100.0 fL   MCH 34.1 (H) 26.0 - 34.0 pg   MCHC 32.7 30.0 - 36.0 g/dL   RDW 14.9 11.5 - 15.5 %   Platelets 167 150 - 400 K/uL   Neutrophils Relative % 43 %   Neutro Abs 2.4 1.7 - 7.7 K/uL   Lymphocytes Relative 43 %   Lymphs Abs 2.5 0.7 - 4.0 K/uL   Monocytes Relative 12 %   Monocytes Absolute 0.7 0.1 - 1.0 K/uL   Eosinophils Relative 2 %   Eosinophils Absolute 0.1 0.0 - 0.7 K/uL   Basophils Relative 0 %   Basophils Absolute 0.0 0.0 - 0.1 K/uL  Basic metabolic panel     Status: Abnormal   Collection Time: 12/25/16  3:33 AM  Result Value Ref Range   Sodium 136 135 - 145 mmol/L   Potassium 2.9 (L) 3.5 - 5.1 mmol/L   Chloride 96 (L) 101 - 111 mmol/L   CO2 30 22 - 32 mmol/L   Glucose, Bld 237 (H) 65 - 99 mg/dL   BUN 8 6 - 20 mg/dL   Creatinine, Ser 0.91 0.44 - 1.00 mg/dL   Calcium 8.7 (L) 8.9 - 10.3 mg/dL   GFR calc  non Af Amer >60 >60 mL/min   GFR calc Af Amer >60 >60 mL/min    Comment: (NOTE) The eGFR has been calculated using the CKD EPI equation. This calculation has not been validated in all clinical situations. eGFR's persistently <60 mL/min signify possible Chronic Kidney Disease.    Anion gap 10 5 - 15  I-stat troponin, ED     Status: None   Collection Time: 12/25/16  3:46 AM  Result Value Ref Range   Troponin i, poc 0.00 0.00 - 0.08 ng/mL   Comment 3  Comment: Due to the release kinetics of cTnI, a negative result within the first hours of the onset of symptoms does not rule out myocardial infarction with certainty. If myocardial infarction is still suspected, repeat the test at appropriate intervals.   Brain natriuretic peptide     Status: Abnormal   Collection Time: 12/25/16  4:09 AM  Result Value Ref Range   B Natriuretic Peptide 172.8 (H) 0.0 - 100.0 pg/mL  Valproic acid level     Status: None   Collection Time: 12/25/16  4:09 AM  Result Value Ref Range   Valproic Acid Lvl 70 50.0 - 100.0 ug/mL  Ethanol     Status: None   Collection Time: 12/25/16  4:09 AM  Result Value Ref Range   Alcohol, Ethyl (B) <5 <5 mg/dL    Comment:        LOWEST DETECTABLE LIMIT FOR SERUM ALCOHOL IS 5 mg/dL FOR MEDICAL PURPOSES ONLY   Urinalysis, Routine w reflex microscopic     Status: None   Collection Time: 12/25/16  6:04 AM  Result Value Ref Range   Color, Urine YELLOW YELLOW   APPearance CLEAR CLEAR   Specific Gravity, Urine 1.012 1.005 - 1.030   pH 6.0 5.0 - 8.0   Glucose, UA NEGATIVE NEGATIVE mg/dL   Hgb urine dipstick NEGATIVE NEGATIVE   Bilirubin Urine NEGATIVE NEGATIVE   Ketones, ur NEGATIVE NEGATIVE mg/dL   Protein, ur NEGATIVE NEGATIVE mg/dL   Nitrite NEGATIVE NEGATIVE   Leukocytes, UA NEGATIVE NEGATIVE  Rapid urine drug screen (hospital performed)     Status: Abnormal   Collection Time: 12/25/16  6:04 AM  Result Value Ref Range   Opiates NONE DETECTED NONE  DETECTED   Cocaine NONE DETECTED NONE DETECTED   Benzodiazepines POSITIVE (A) NONE DETECTED   Amphetamines NONE DETECTED NONE DETECTED   Tetrahydrocannabinol NONE DETECTED NONE DETECTED   Barbiturates NONE DETECTED NONE DETECTED    Comment:        DRUG SCREEN FOR MEDICAL PURPOSES ONLY.  IF CONFIRMATION IS NEEDED FOR ANY PURPOSE, NOTIFY LAB WITHIN 5 DAYS.        LOWEST DETECTABLE LIMITS FOR URINE DRUG SCREEN Drug Class       Cutoff (ng/mL) Amphetamine      1000 Barbiturate      200 Benzodiazepine   032 Tricyclics       122 Opiates          300 Cocaine          300 THC              50   TSH     Status: None   Collection Time: 12/25/16 12:22 PM  Result Value Ref Range   TSH 0.354 0.350 - 4.500 uIU/mL    Comment: Performed by a 3rd Generation assay with a functional sensitivity of <=0.01 uIU/mL.  Troponin I     Status: None   Collection Time: 12/25/16 12:22 PM  Result Value Ref Range   Troponin I <0.03 <0.03 ng/mL  Basic metabolic panel     Status: Abnormal   Collection Time: 12/25/16 12:22 PM  Result Value Ref Range   Sodium 133 (L) 135 - 145 mmol/L   Potassium 3.5 3.5 - 5.1 mmol/L    Comment: DELTA CHECK NOTED   Chloride 93 (L) 101 - 111 mmol/L   CO2 27 22 - 32 mmol/L   Glucose, Bld 461 (H) 65 - 99 mg/dL   BUN 12 6 - 20 mg/dL  Creatinine, Ser 1.26 (H) 0.44 - 1.00 mg/dL   Calcium 8.9 8.9 - 10.3 mg/dL   GFR calc non Af Amer 44 (L) >60 mL/min   GFR calc Af Amer 51 (L) >60 mL/min    Comment: (NOTE) The eGFR has been calculated using the CKD EPI equation. This calculation has not been validated in all clinical situations. eGFR's persistently <60 mL/min signify possible Chronic Kidney Disease.    Anion gap 13 5 - 15  Glucose, capillary     Status: Abnormal   Collection Time: 12/25/16 12:30 PM  Result Value Ref Range   Glucose-Capillary 469 (H) 65 - 99 mg/dL    Current Facility-Administered Medications  Medication Dose Route Frequency Provider Last Rate Last Dose   . acetaminophen (TYLENOL) tablet 650 mg  650 mg Oral Q6H PRN Diallo, Abdoulaye, MD       Or  . acetaminophen (TYLENOL) suppository 650 mg  650 mg Rectal Q6H PRN Diallo, Abdoulaye, MD      . albuterol (PROVENTIL) (2.5 MG/3ML) 0.083% nebulizer solution 2.5 mg  2.5 mg Nebulization BID PRN Andrena Mews T, MD      . aspirin chewable tablet 81 mg  81 mg Oral Daily Diallo, Abdoulaye, MD   81 mg at 12/25/16 1011  . divalproex (DEPAKOTE) DR tablet 1,000 mg  1,000 mg Oral BID Diallo, Abdoulaye, MD   500 mg at 12/25/16 1254  . enoxaparin (LOVENOX) injection 40 mg  40 mg Subcutaneous Q24H Diallo, Abdoulaye, MD      . haloperidol (HALDOL) tablet 5 mg  5 mg Oral BID Akintayo, Mojeed, MD      . insulin aspart (novoLOG) injection 0-9 Units  0-9 Units Subcutaneous TID WC Diallo, Abdoulaye, MD   10 Units at 12/25/16 1254  . ipratropium-albuterol (DUONEB) 0.5-2.5 (3) MG/3ML nebulizer solution 3 mL  3 mL Nebulization Q2H PRN Andrena Mews T, MD      . levofloxacin (LEVAQUIN) tablet 750 mg  750 mg Oral Daily Diallo, Abdoulaye, MD   750 mg at 12/25/16 1008  . [START ON 12/26/2016] levothyroxine (SYNTHROID, LEVOTHROID) tablet 50 mcg  50 mcg Oral QAC breakfast Diallo, Abdoulaye, MD      . LORazepam (ATIVAN) tablet 1 mg  1 mg Oral TID Diallo, Abdoulaye, MD   1 mg at 12/25/16 1010  . pantoprazole (PROTONIX) EC tablet 40 mg  40 mg Oral Daily Diallo, Abdoulaye, MD   40 mg at 12/25/16 1254  . pravastatin (PRAVACHOL) tablet 40 mg  40 mg Oral QPM Diallo, Abdoulaye, MD      . Derrill Memo ON 12/26/2016] predniSONE (DELTASONE) tablet 50 mg  50 mg Oral Q breakfast Diallo, Abdoulaye, MD      . trihexyphenidyl (ARTANE) tablet 5 mg  5 mg Oral BID WC Akintayo, Mojeed, MD        Musculoskeletal: Strength & Muscle Tone: within normal limits Gait & Station: normal Patient leans: N/A  Psychiatric Specialty Exam: Physical Exam  Psychiatric: She has a normal mood and affect. Her behavior is normal. Judgment normal. Her speech is  tangential. Thought content is paranoid and delusional. Cognition and memory are impaired.    Review of Systems  Constitutional: Negative.   HENT: Negative.   Eyes: Negative.   Respiratory: Positive for shortness of breath.   Cardiovascular: Negative.   Gastrointestinal: Negative.   Genitourinary: Negative.   Musculoskeletal: Positive for myalgias.  Skin: Negative.   Neurological: Positive for tremors.  Endo/Heme/Allergies: Negative.   Psychiatric/Behavioral: The patient is nervous/anxious.  Blood pressure 133/63, pulse 91, temperature 97.7 F (36.5 C), temperature source Oral, resp. rate 18, height '5\' 3"'  (1.6 m), weight 78.7 kg (173 lb 9.6 oz), SpO2 95 %.Body mass index is 30.75 kg/m.  General Appearance: Bizarre  Eye Contact:  Good  Speech:  Clear and Coherent  Volume:  Increased  Mood:  Euthymic  Affect:  Labile  Thought Process:  Coherent  Orientation:  Full (Time, Place, and Person)  Thought Content:  Delusions and Paranoid Ideation  Suicidal Thoughts:  No  Homicidal Thoughts:  No  Memory:  Immediate;   Good Recent;   Fair Remote;   Fair  Judgement:  Other:  marginal  Insight:  Shallow  Psychomotor Activity:  Increased  Concentration:  Concentration: Fair and Attention Span: Fair  Recall:  [employer] of Knowledge:  Fair  Language:  Good  Akathisia:  No  Handed:  Right  AIMS (if indicated):     Assets:  Armed forces logistics/support/administrative officer Social Support  ADL's: marginal  Cognition:  Impaired,  Mild  Sleep:   fair     Treatment Plan Summary: 66 year old woman with long history of mental illness; Schizoaffective disorder who presents with shortness of breath, cough and paranoia. Patient was recently treated for her mental illness in a psychiatric hospital and states that she is compliant with her medication. Patient is calm, somewhat pleasant, paranoid but not suicidal or homicidal. I will re-evaluate patient's current medications and make necessary adjustment  but patient  does not meet criteria for inpatient psychiatric admission.  Plan/Recommendation: -Discontinue Risperdal -Start Haldol 5 mg bid for paranoia/psychosis -Start Artane 5 mg bid for bilateral hand tremors/EPS prevention -Continue current Depakote dosage for mood stabilization -Liver function test necessary.  Disposition: No evidence of imminent risk to self or others at present.   Patient does not meet criteria for psychiatric inpatient admission. Unit social worker to assit with referring patient back to [place] upon discharge  Corena Pilgrim, MD 12/25/2016 1:33 PM

## 2016-12-25 NOTE — ED Notes (Signed)
Nadezhda Pollitt (daughter), healthcare POA, to be contacted as needed [phone]. Mileydi Milsap, daughter, [phone]; letonya, mangels, [phone]; Tiffney, daughter, [phone]) [phone] can also be contacted as needed
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
## 2016-12-25 NOTE — ED Notes (Signed)
Pt belongings given to family and pt made aware. Cane left due to pt needing it for ambulation

## 2016-12-25 NOTE — ED Notes (Signed)
Breathing treatment paused, pt requesting to eat prior to finishing treatment

## 2016-12-25 NOTE — ED Notes (Signed)
Pt placed in wine colored scrubs and wanded by security. Pt brought in by family for worsening paranoia at home. All belongings given to family

## 2016-12-25 NOTE — ED Provider Notes (Signed)
By signing my name below, I, Eunice Blase, attest that this documentation has been prepared under the direction and in the presence of Ward, Delice Bison, DO. Electronically signed, Eunice Blase, ED Scribe. 12/25/16. 4:14 AM.   TIME SEEN: 3:48 AM  CHIEF COMPLAINT: SOB  LEVEL 5 CAVEAT: HPI and ROS limited due to dementia   HPI:  [name] Long is a 66 y.o. female with h/o COPD, stage II kidney disease, dementia and schizoaffective disorder BIB family to the Emergency Department concerning SOB x 2 weeks. Associated cough productive of clear sputum noted. Family also reports leg swelling, urinary incontinence x 2.  Patient denies any chest pain. No nausea, vomiting or diarrhea. She smokes cigarettes. She does not wear oxygen at home.  Family further expresses profuse concern for the pt's tangential speech and paranoia, which have been increasing x 2 years. Pt expresses paranoia, delusions and agitation on evaluation. Pt difficult to interact with d/t this. Family referred to ACT team for pt's declining mental health during aforementioned evaluation; they state they are in the process of reaching out to these resources. No SI/HI or hallucinations. Patient was just admitted to the psychiatric facility in May 2018. They're concerned that patient continues to decline and may need psychiatric evaluation. Patient's youngest daughter is her guardian/power of attorney.  ROS: See HPI  PAST MEDICAL HISTORY/PAST SURGICAL HISTORY:  Past Medical History:  Diagnosis Date  . BIPOLAR DISORDER 08/31/2006   Qualifier: Diagnosis of  By: Dorathy Daft MD, Marjory Lies    . CHRONIC KIDNEY DISEASE STAGE II (MILD) 09/14/2009   Annotation: eGFR 64 Qualifier: Diagnosis of  By: Jess Barters MD, [name]    . COPD (chronic obstructive pulmonary disease) ([place]) 09/23/2010   Diagnosed at [facility] in 2008 (Dr. Annamaria Boots)   . DM (diabetes mellitus) type II controlled with renal manifestation ([place]) 08/31/2006   Qualifier: Diagnosis of  By:  Dorathy Daft MD, Marjory Lies    . HYPERCHOLESTEROLEMIA 08/31/2006   Intolerance to Lipitor OK on Crestor but medicaid no longer covering    . HYPERTENSION, BENIGN SYSTEMIC 08/31/2006   Qualifier: Diagnosis of  By: Dorathy Daft MD, Marjory Lies    . HYPOTHYROIDISM, UNSPECIFIED 08/31/2006   Qualifier: Diagnosis of  By: Dorathy Daft MD, Marjory Lies      MEDICATIONS:  Prior to Admission medications   Medication Sig Start Date End Date Taking? Authorizing Provider  albuterol (PROVENTIL HFA) 108 (90 Base) MCG/ACT inhaler Inhale 2 puffs into the lungs every 4 (four) hours as needed. For wheezing 11/10/16   Ronnie Doss M, DO  amLODipine (NORVASC) 5 MG tablet Take 1 tablet (5 mg total) by mouth daily. 07/22/16   [name], DO  aspirin ([employer] ASPIRIN) 81 MG chewable tablet Chew 81 mg by mouth daily.      [provider]  blood glucose meter kit and supplies KIT Dispense based on patient and insurance preference. Use up to four times daily as directed to test blood sugar. Dx E11.9 12/16/16   Smiley Houseman, MD  DEPAKOTE ER 500 MG 24 hr tablet Take 500 mg by mouth 4 (four) times daily. 09/07/16   [provider]  divalproex (DEPAKOTE) 500 MG DR tablet Take 1 tablet (500 mg total) by mouth every 8 (eight) hours. Patient not taking: Reported on 10/15/2016 06/24/16   Pucilowska, Herma Ard B, MD  docusate sodium (COLACE) 100 MG capsule Take 1 capsule (100 mg total) by mouth every 12 (twelve) hours. Patient not taking: Reported on 10/15/2016 05/28/16   Dorie Rank, MD  fluticasone (FLONASE) 50 MCG/ACT nasal spray Place 2 sprays into both nostrils daily. Patient not taking: Reported on 10/15/2016 12/21/15   Ronnie Doss M, DO  fluticasone (FLOVENT HFA) 110 MCG/ACT inhaler Inhale 1 puff into the lungs 2 (two) times daily. Patient not taking: Reported on 10/15/2016 09/26/16   Ronnie Doss M, DO  glucose blood (ACCU-CHEK AVIVA PLUS) test strip Check sugar 3 times daily. E11.9 12/16/16   Smiley Houseman, MD  hydrochlorothiazide (HYDRODIURIL) 25 MG tablet Take 25 mg by mouth daily. 08/30/16   [provider]  Lancets (ACCU-CHEK MULTICLIX) lancets Check blood sugar 3 times a day. E11.9 12/16/16   Smiley Houseman, MD  levothyroxine (SYNTHROID) 50 MCG tablet Take 1 tablet (50 mcg total) by mouth daily before breakfast. 07/22/16   Ronnie Doss M, DO  LORazepam (ATIVAN) 0.5 MG tablet Take 1 tablet (0.5 mg total) by mouth 2 (two) times daily. Patient taking differently: Take 0.5 mg by mouth 2 (two) times daily as needed for sleep.  06/24/16   Pucilowska, Jolanta B, MD  metFORMIN (GLUCOPHAGE) 500 MG tablet Take 500 mg by mouth daily with breakfast. 11/09/16   [provider]  naproxen (NAPROSYN) 500 MG tablet Take 1 tablet (500 mg total) by mouth 2 (two) times daily with a meal. 11/10/16   Ronnie Doss M, DO  neomycin-polymyxin-hydrocortisone (CORTISPORIN) otic solution Place 4 drops into the left ear 4 (four) times daily. x10 days 11/10/16   [name], DO  nicotine (NICODERM CQ - DOSED IN MG/24 HOURS) 14 mg/24hr patch Place 1 patch (14 mg total) onto the skin daily. 11/30/16   [name], DO  nitroGLYCERIN (NITROSTAT) 0.4 MG SL tablet Place 1 tablet (0.4 mg total) under the tongue every 5 (five) minutes as needed for chest pain. 02/26/15   [name], DO  olopatadine (PATANOL) 0.1 % ophthalmic solution Place 1 drop into both eyes 2 (two) times daily. Patient not taking: Reported on 10/15/2016 05/17/16   Smiley Houseman, MD  omeprazole (PRILOSEC) 20 MG capsule Take 1 capsule (20 mg total) by mouth daily. 11/10/16   [name], DO  polyethylene glycol (MIRALAX) packet Take 17 g by mouth daily. Patient not taking: Reported on 10/15/2016 05/28/16   Dorie Rank, MD  pravastatin (PRAVACHOL) 40 MG tablet Take 1 tablet (40 mg total) by mouth every evening. 07/22/16   [name], DO  prednisoLONE acetate (PRED FORTE) 1 % ophthalmic  suspension Place 1 drop into both eyes daily.    [provider]  risperiDONE (RISPERDAL) 1 MG tablet Take 1 tablet (1 mg total) by mouth 2 (two) times daily. Patient taking differently: Take 1 mg by mouth at bedtime as needed (for restless leg).  06/24/16   Pucilowska, Jolanta B, MD  temazepam (RESTORIL) 30 MG capsule Take 1 capsule (30 mg total) by mouth at bedtime. Patient not taking: Reported on 10/15/2016 06/24/16   Clovis Fredrickson, MD    ALLERGIES:  Allergies  Allergen Reactions  . Ibuprofen Other (See Comments)    Unknown  . Lipitor [Atorvastatin Calcium] Other (See Comments)    Body aches  . Lisinopril Other (See Comments)    REACTION: PER DR. Melvyn Novas  . Codeine Rash    SOCIAL HISTORY:  Social History  Substance Use Topics  . Smoking status: Current Every Day Smoker    Packs/day: 0.50    Years: 39.00    Types: Cigarettes  . Smokeless tobacco: Never Used  . Alcohol  use Yes    FAMILY HISTORY: Family History  Problem Relation Age of Onset  . Breast cancer Sister     EXAM: BP 133/76   Pulse 87   Temp 98.7 F (37.1 C) (Oral)   Resp (!) 22   SpO2 100%  CONSTITUTIONAL: Alert and oriented 3 and responds appropriately to questions but has rapid tangential speech and is all over the place with her story that does not make much sense. She is chronically ill-appearing. Afebrile and nontoxic. HEAD: Normocephalic, atraumatic EYES: Conjunctivae clear, pupils appear equal, EOMI ENT: normal nose; moist mucous membranes NECK: Supple, no meningismus, no nuchal rigidity, no LAD  CARD: RRR; S1 and S2 appreciated; no murmurs, no clicks, no rubs, no gallops RESP: Normal chest excursion without splinting or tachypnea; patient has diminished aeration at her bases bilaterally with some scattered expiratory wheezes and very rhonchorous breath sounds, equal breath sounds bilaterally, speaking full sentences, no hypoxia, no respiratory distress ABD/GI: Normal bowel sounds;  non-distended; soft, non-tender, no rebound, no guarding, no peritoneal signs, no hepatosplenomegaly BACK:  The back appears normal and is non-tender to palpation, there is no CVA tenderness EXT: Normal ROM in all joints; non-tender to palpation; he has edema in her distal lower extremities and feet bilaterally that is nonpitting, 2+ DP pulses bilaterally; normal capillary refill; no cyanosis, no calf tenderness or swelling    SKIN: Normal color for age and race; warm; no rash NEURO: Moves all extremities equally, normal gait, normal speech PSYCH: Patient seems paranoid, delusional. She has rapid, tangential, nonsensical speech. Patient states things like "my husband owns [place]", "someone threw glass in my eyes taking away my gifted sight", "my daughters are just try to lock me up to get my inheritance"  MEDICAL DECISION MAKING: Patient here with shortness of breath. I suspect COPD versus CHF exacerbation. She does have pedal edema. Denies any chest pain. We'll obtain labs and give albuterol, Solu-Medrol, Atrovent and Lasix. We'll obtain a chest x-ray as well.  I'm also extremely concerned about patient's psychiatric condition. She seems very paranoid, delusional and has very poor insight into her disease. I feel at this time she is a danger to herself and others. She is beginning to get agitated and requesting to leave. At this time I do not feel she is safe to make these decisions for herself and I feel she may need inpatient psychiatric treatment once medically cleared. Because of this and after long discussion with patient's daughters who agree we have decided to take out involuntary commitment paperwork. Patient has been informed of this and is not combative, agitated and redirectable verbally.  ED PROGRESS: Patient's chest x-ray shows atelectasis in the left lung base. Less likely developing infiltrate. She has no fever, cough or leukocytosis. I feel this can be monitored. She is  hypokalemic here without EKG changes. I will give oral and IV replacement given she is receiving both albuterol and Lasix. She is diuresing well. Her lungs seem to have opened out more after continuous breathing treatment but she still has rhonchorous breath sounds and I feel she needs medical admission. Her PCP is with family medicine.  Her workup was otherwise unremarkable. Negative troponin. Depakote level is therapeutic. Urine shows no infection. Drug screen positive for benzodiazepines.   6:30 AM Discussed patient's case with FM resident.  I have recommended admission and patient (and family if present) agree with this plan. Admitting physician will place admission orders.   I reviewed all nursing notes, vitals, pertinent  previous records, EKGs, lab and urine results, imaging (as available).     EKG Interpretation  Date/Time:  Sunday December 25 2016 01:17:23 EDT Ventricular Rate:  87 PR Interval:  184 QRS Duration: 68 QT Interval:  212 QTC Calculation: 255 R Axis:   61 Text Interpretation:  Sinus rhythm with Premature atrial complexes Anterior infarct , age undetermined Abnormal ECG Confirmed by Pryor Curia [phone]) on 12/25/2016 3:22:26 AM        I personally performed the services described in this documentation, which was scribed in my presence. The recorded information has been reviewed and is accurate.    Ward, Delice Bison, DO 12/25/16 [phone]

## 2016-12-25 NOTE — ED Notes (Signed)
Admitting MD at bedside.

## 2016-12-25 NOTE — ED Triage Notes (Signed)
Pt complaining of SOB. Pt states coughing up thick clear mucus. Pt denies any chest pain. Pt denies any N/V/D.

## 2016-12-25 NOTE — Progress Notes (Signed)
After one dose of IV metoprolol, patient's heart rate a-fib in the 90s.  Will continue to monitor.

## 2016-12-25 NOTE — Progress Notes (Signed)
On call MD paged with CBG [phone].  Will continue to monitor.

## 2016-12-25 NOTE — Progress Notes (Signed)
Patient ekg showed afib with rvr.  MD on call paged. New orders received.  Will continue to monitor

## 2016-12-25 NOTE — ED Notes (Signed)
Pt has agreed to keep cardiac monitor on. 5 lead placed on patient

## 2016-12-25 NOTE — ED Notes (Signed)
Pt removed all cardiac monitoring wires

## 2016-12-26 ENCOUNTER — Inpatient Hospital Stay (HOSPITAL_COMMUNITY): Payer: Medicare Other

## 2016-12-26 ENCOUNTER — Other Ambulatory Visit (HOSPITAL_COMMUNITY): Payer: Self-pay

## 2016-12-26 DIAGNOSIS — F17210 Nicotine dependence, cigarettes, uncomplicated: Secondary | ICD-10-CM | POA: Diagnosis not present

## 2016-12-26 DIAGNOSIS — I509 Heart failure, unspecified: Secondary | ICD-10-CM | POA: Diagnosis not present

## 2016-12-26 DIAGNOSIS — E785 Hyperlipidemia, unspecified: Secondary | ICD-10-CM | POA: Diagnosis not present

## 2016-12-26 DIAGNOSIS — F250 Schizoaffective disorder, bipolar type: Secondary | ICD-10-CM

## 2016-12-26 DIAGNOSIS — I499 Cardiac arrhythmia, unspecified: Secondary | ICD-10-CM | POA: Diagnosis not present

## 2016-12-26 DIAGNOSIS — R600 Localized edema: Secondary | ICD-10-CM | POA: Diagnosis not present

## 2016-12-26 DIAGNOSIS — R0602 Shortness of breath: Secondary | ICD-10-CM | POA: Diagnosis not present

## 2016-12-26 DIAGNOSIS — N179 Acute kidney failure, unspecified: Secondary | ICD-10-CM | POA: Diagnosis not present

## 2016-12-26 DIAGNOSIS — E876 Hypokalemia: Secondary | ICD-10-CM | POA: Diagnosis not present

## 2016-12-26 DIAGNOSIS — R0600 Dyspnea, unspecified: Secondary | ICD-10-CM

## 2016-12-26 DIAGNOSIS — E039 Hypothyroidism, unspecified: Secondary | ICD-10-CM | POA: Diagnosis not present

## 2016-12-26 DIAGNOSIS — J441 Chronic obstructive pulmonary disease with (acute) exacerbation: Secondary | ICD-10-CM | POA: Diagnosis not present

## 2016-12-26 LAB — ECHOCARDIOGRAM COMPLETE
Ao-asc: 29 cm
Area-P 1/2: 3.44 cm2
E decel time: 218 msec
E/e' ratio: 10.59
FS: 35 % (ref 28–44)
Height: 63 in
IVS/LV PW RATIO, ED: 1.18
LA ID, A-P, ES: 40 mm
LA diam end sys: 40 mm
LA diam index: 2.09 cm/m2
LA vol A4C: 48.2 ml
LA vol index: 30.8 mL/m2
LA vol: 58.9 mL
LV E/e' medial: 10.59
LV E/e'average: 10.59
LV PW d: 12.3 mm — AB (ref 0.6–1.1)
LV dias vol index: 32 mL/m2
LV dias vol: 61 mL (ref 46–106)
LV e' LATERAL: 10.1 cm/s
LV sys vol index: 10 mL/m2
LV sys vol: 19 mL (ref 14–42)
LVOT SV: 77 mL
LVOT VTI: 27.2 cm
LVOT area: 2.84 cm2
LVOT diameter: 19 mm
LVOT peak grad rest: 7 mmHg
LVOT peak vel: 132 cm/s
Lateral S' vel: 12.3 cm/s
MV Dec: 218
MV Peak grad: 5 mmHg
MV pk A vel: 50 m/s
MV pk E vel: 107 m/s
P 1/2 time: 64 ms
Reg peak vel: 244 cm/s
Simpson's disk: 68
Stroke v: 42 ml
TAPSE: 24 mm
TDI e' lateral: 10.1
TDI e' medial: 8.16
TR max vel: 244 cm/s
Weight: 2806.4 oz

## 2016-12-26 LAB — CBC
HCT: 36.3 % (ref 36.0–46.0)
Hemoglobin: 12 g/dL (ref 12.0–15.0)
MCH: 34.1 pg — ABNORMAL HIGH (ref 26.0–34.0)
MCHC: 33.1 g/dL (ref 30.0–36.0)
MCV: 103.1 fL — ABNORMAL HIGH (ref 78.0–100.0)
Platelets: 183 10*3/uL (ref 150–400)
RBC: 3.52 MIL/uL — ABNORMAL LOW (ref 3.87–5.11)
RDW: 15 % (ref 11.5–15.5)
WBC: 9 10*3/uL (ref 4.0–10.5)

## 2016-12-26 LAB — COMPREHENSIVE METABOLIC PANEL
ALT: 11 U/L — ABNORMAL LOW (ref 14–54)
AST: 16 U/L (ref 15–41)
Albumin: 3 g/dL — ABNORMAL LOW (ref 3.5–5.0)
Alkaline Phosphatase: 76 U/L (ref 38–126)
Anion gap: 10 (ref 5–15)
BUN: 17 mg/dL (ref 6–20)
CO2: 30 mmol/L (ref 22–32)
Calcium: 8.9 mg/dL (ref 8.9–10.3)
Chloride: 93 mmol/L — ABNORMAL LOW (ref 101–111)
Creatinine, Ser: 1.13 mg/dL — ABNORMAL HIGH (ref 0.44–1.00)
GFR calc Af Amer: 58 mL/min — ABNORMAL LOW (ref >60)
GFR calc non Af Amer: 50 mL/min — ABNORMAL LOW (ref >60)
Glucose, Bld: 337 mg/dL — ABNORMAL HIGH (ref 65–99)
Potassium: 3.6 mmol/L (ref 3.5–5.1)
Sodium: 133 mmol/L — ABNORMAL LOW (ref 135–145)
Total Bilirubin: 0.2 mg/dL — ABNORMAL LOW (ref 0.3–1.2)
Total Protein: 7.1 g/dL (ref 6.5–8.1)

## 2016-12-26 LAB — GLUCOSE, CAPILLARY
Glucose-Capillary: 369 mg/dL — ABNORMAL HIGH (ref 65–99)
Glucose-Capillary: 309 mg/dL — ABNORMAL HIGH (ref 65–99)
Glucose-Capillary: 326 mg/dL — ABNORMAL HIGH (ref 65–99)
Glucose-Capillary: 293 mg/dL — ABNORMAL HIGH (ref 65–99)

## 2016-12-26 LAB — HIV ANTIBODY (ROUTINE TESTING W REFLEX): HIV Screen 4th Generation wRfx: NONREACTIVE

## 2016-12-26 LAB — TROPONIN I: Troponin I: <0.03 ng/mL (ref <0.03)

## 2016-12-26 MED ORDER — TRIHEXYPHENIDYL HCL 5 MG PO TABS: 5.0000 mg | ORAL_TABLET | Freq: Two times a day (BID) | ORAL | 0 refills | Status: DC

## 2016-12-26 MED ORDER — NICOTINE 14 MG/24HR TD PT24
14.0000 mg | MEDICATED_PATCH | Freq: Every day | TRANSDERMAL | Status: DC
  Filled 2016-12-26: qty 1

## 2016-12-26 MED ORDER — PREDNISONE 20 MG PO TABS: 20.0000 mg | ORAL_TABLET | Freq: Every day | ORAL | Status: DC

## 2016-12-26 MED ORDER — LEVOFLOXACIN 500 MG PO TABS: 500.0000 mg | ORAL_TABLET | Freq: Every day | ORAL | 0 refills | Status: AC

## 2016-12-26 MED ORDER — METFORMIN HCL 500 MG PO TABS: 500.0000 mg | ORAL_TABLET | Freq: Every day | ORAL | Status: DC

## 2016-12-26 MED ORDER — RISPERIDONE 1 MG PO TABS
1.0000 mg | ORAL_TABLET | Freq: Two times a day (BID) | ORAL | Status: DC
  Administered 2016-12-26 (×2): 1 mg via ORAL
  Filled 2016-12-26 (×2): qty 1

## 2016-12-26 MED ORDER — APIXABAN 5 MG PO TABS: 5.0000 mg | ORAL_TABLET | Freq: Two times a day (BID) | ORAL | 0 refills | Status: DC

## 2016-12-26 MED ORDER — METFORMIN HCL 500 MG PO TABS: 500.0000 mg | ORAL_TABLET | Freq: Every day | ORAL | 0 refills | Status: DC

## 2016-12-26 MED ORDER — PREDNISONE 20 MG PO TABS: 20.0000 mg | ORAL_TABLET | Freq: Every day | ORAL | 0 refills | Status: AC

## 2016-12-26 MED ORDER — METOPROLOL TARTRATE 25 MG PO TABS: 25.0000 mg | ORAL_TABLET | Freq: Two times a day (BID) | ORAL | 0 refills | Status: DC

## 2016-12-26 MED ORDER — DIVALPROEX SODIUM 500 MG PO DR TAB: 1000.0000 mg | DELAYED_RELEASE_TABLET | Freq: Two times a day (BID) | ORAL | 0 refills | Status: DC

## 2016-12-26 NOTE — Care Management Note (Signed)
Case Management Note  Patient Details  Name: [name] Long MRN: [id number] Date of Birth: [birth year]  Subjective/Objective:      Admitted with Schizoaffective Disorder             Action/Plan: Primary Care Provider: [name], DO; has private insurance with Medicare /Medicaid with prescription drug coverage; CM is unable to talk to patient due to mental status/ delusions; CM will continue to follow for DCP.  Expected Discharge Date:      Possibly 12/31/2016            Expected Discharge Plan:  Home/Self Care  Discharge planning Services  CM Consult  Status of Service:  In process, will continue to follow  Sherrilyn Rist [phone] 12/26/2016, 1:05 PM

## 2016-12-26 NOTE — Progress Notes (Signed)
Patient refused am lab draw

## 2016-12-26 NOTE — Progress Notes (Signed)
Family Medicine Teaching Service Daily Progress Note Intern Pager: [phone]  Patient name: [name] Long Medical record number: [id number] Date of birth: [birth year] Age: 66 y.o. Gender: female  Primary Care Provider: [name], DO Consultants: Psychiatry Code Status: Full  Pt Overview and Major Events to Date:  12/25/16 admitted for dyspnea  Assessment and Plan: [name] Long is a 66 y.o. female presenting with productive cough and dyspnea. PMH is significant for hypothyroidism, DM2, HLD, HTN, CKDII, COPD, Tobacco Use.   Dyspnea with cough and sputum production: likely 2/2 COPD exacerbation. DDx also includes new onset CHF. Unlikely ACS since trop neg and no ischemic changes on EKG. On room air with stable vitals but lung exam with continued wheezes. CXR L lung base atelectatic changes. - s/p Solumedrol 125mg  x1 - Prednisone 50mg  today, decrease to 20mg  qd tomorrow. Total of 5 days, last dose 6/30 - Duonebs q 4 scheduled, with Albuterol q2 PRN  - PO Levaquin (6/24-)  AFib with RVR ?LE edema:  Had episode of asymptomatic afib with RVR yesterday with HR in 120s. Started on lopressor and is now rate controlled. Admitted with LE edema and ?orthopnea. No diagnosis of CHF in chart but concerning for new onset CHF. BNP to 172.8 (no prior to compare). CXR without overt fluid overload. - monitor on tele - s/p IV Lasix 40mg  x 1 - strict I/Os - daily weights  - ECHO - continue lopressor 25mg  BID - continue eliquis given chadsvasc >2  Schizoaffective DO Bipolar Type: Has paranoia and delusions. Per daughter this is her baseline but has been worsening over the past 2 years. Denies SI/HI or hallucinations. Currently under IVC placed by ED provider.  Recent admission to [facility] hospital where daughter reports patient was diagnosed with mild dementia. Medication reconciliation completed with daughter who is her caretaker and guardian. Of note, QTc is normal on EKG.  - Per  Psychiatry, does not meet inpatient psychiatry admission. Appreciate recommendations  - Discontinued home risperdal  - Haldol 5mg  BID for paranoia/psychosis  - Start Artane 5 mg bid for bilateral hand tremors/EPS prevention  -Continue Depakote 1000mg  BID for mood stabilization  - Monitor LFTs - sitter at bedside  DM2: A1c 7.7 in 11/2016. Home med include Metformin  - restart home metformin 500mg  qd - discontinue SSI and CBGs since a1c at goal on home medication  HTN: currently normotensive.  - holding HCTZ, Norvasc for now.  - monitor BP   HLD: on Pravachol at home. Last lipid on file 02/2016: LDL 81, total cholesterol 156, HDL 49, TAG 128.  - continue Pravachol   Hypothyroid: on synthroid 43mcg daily - continue home synthroid  - TSH 0.354  GERD:  - continue Prilosec  Macrocytosis, Elevated MCV: normal hgb. No folate or Vit b12 in chart - consider checking Folate and Vitamin B12   Tobacco Use: Current smoker 1ppd, having cigarette cravings - start nicotine patch  Hypokalemia, resolved: K 3.6 this am. Unclear etiology. Chronic issue. Mag 1.7 - monitor  FEN/GI: heart/carb modified  Prophylaxis: eliquis   Disposition: pending diagnostic work up  Subjective:  States breathing is better today, less wheezing. Still SOB with ambulation. No longer having orthopnea. Still with swelling in the legs. Still talking tangentially with delusions. Demanding to go smoking cigarettes outside. Daughter at bedside and requesting nicotine patch for her mother.   Objective: Temp:  [97.5 F (36.4 C)-98.6 F (37 C)] 97.5 F (36.4 C) (06/25 0345) Pulse Rate:  [78-117] 117 (06/25 0345)  Resp:  [18-24] 21 (06/25 0345) BP: (91-133)/(59-89) 91/59 (06/25 0345) SpO2:  [95 %-100 %] 95 % (06/25 0345) Weight:  [173 lb 9.6 oz (78.7 kg)-175 lb 6.4 oz (79.6 kg)] 175 lb 6.4 oz (79.6 kg) (06/25 0345) Physical Exam: General: sitting on side of bed, in NAD Cardiovascular: RRR, no  murmurs Respiratory: expiratory wheezes but normal effort on room air, good air movement bilaterally.  Abdomen: soft, nontender, nondistended, + bowel sounds Extremities: 2+ pitting edema to b/l knees  Laboratory:  Recent Labs Lab 12/25/16 0333  WBC 5.7  HGB 12.6  HCT 38.5  PLT 167    Recent Labs Lab 12/25/16 0333 12/25/16 1222  NA 136 133*  K 2.9* 3.5  CL 96* 93*  CO2 30 27  BUN 8 12  CREATININE 0.91 1.26*  CALCIUM 8.7* 8.9  GLUCOSE 237* 461*   Troponin <0.03 TSH 0.354 T4 0.76   Imaging/Diagnostic Tests: No results found.  Bufford Lope, DO 12/26/2016, 7:05 AM PGY-1, [place] Intern pager: [phone], text pages welcome

## 2016-12-26 NOTE — Discharge Summary (Signed)
[facility] Discharge Summary  Patient name: [name] Long Medical record number: [id number] Date of birth: Jan 06, 1951 Age: 66 y.o. Gender: female Date of Admission: 12/25/2016  Date of Discharge: 12/26/16 Admitting Physician: Smiley Houseman, MD  Primary Care Provider: [name], DO Consultants: Psychiatry  Indication for Hospitalization: dyspnea  Discharge Diagnoses/Problem List:  COPD exacerbation AFib Schizoaffective Bipolar d/o T2DM HTN HLD Hypothyroidism GERD Tobacco use  Disposition: Home  Discharge Condition: Stable, improved  Discharge Exam: Please see progress note on day of discharge  Brief Hospital Course:  [name] Long a 66 y.o.femalepresenting with productive cough and dyspnea. PMH is significant for hypothyroidism, DM2, HLD, HTN, CKDII, COPD, Tobacco Use.   Dyspnea 2/2 COPD exacerbation Patient presented with increased work of breathing and productive cough with whitish sputum. Was started on steroids and antibiotics with improvement. Did well clinically and was deemed stable for discharged on prednisone 20mg  to complete 5 days total and levaquin for 7 days total.   Afib Patient went into Afib with RVR during her hospitalization, was asymptomatic. She was started on metoprolol for rate control and did well. She was also started on Eliquis given chadvasc >2. She had a echocardiogram that showed mild LVH with EF ~68%.   Schizoaffective DO Bipolar Type Patient had paranoia and delusions. Per daughter this is her baseline but has been worsening over the past 2 years. Denies SI/HI or hallucinations. Psychiatry was consulted and stated that does not meet criteria for inpatient psychiatric hospitalization. Patient's medications were also adjusted, kept on home risperidone 1 mg twice daily for schizophrenia with paranoid psychosis as well as Depakote 1000mg  BID for mood stabilization, started Artane 5 mg bid for  bilateral hand tremors/EPS prevention.  Issues for Follow Up:  1. Please follow up on respiratory status in the setting of COPD exacerbation. Last dose of prednisone 12/31/16. Last dose levaquin 01/01/17. 2. Rate controlled for Afib on metoprolol and tolerating Eliquis well which were started this hospital stay. 3. Follow up on mood disorder, has paranoia and delusions at baseline.  Significant Procedures: none  Significant Labs and Imaging:   Recent Labs Lab 12/25/16 0333 12/26/16 0718  WBC 5.7 9.0  HGB 12.6 12.0  HCT 38.5 36.3  PLT 167 183    Recent Labs Lab 12/25/16 0333 12/25/16 1222 12/26/16 0718  NA 136 133* 133*  K 2.9* 3.5 3.6  CL 96* 93* 93*  CO2 30 27 30   GLUCOSE 237* 461* 337*  BUN 8 12 17   CREATININE 0.91 1.26* 1.13*  CALCIUM 8.7* 8.9 8.9  MG  --  1.7  --   ALKPHOS  --   --  76  AST  --   --  16  ALT  --   --  11*  ALBUMIN  --   --  3.0*     Results/Tests Pending at Time of Discharge: none  Discharge Medications:  Allergies as of 12/26/2016      Reactions   Ibuprofen Other (See Comments)   Unknown   Lipitor [atorvastatin Calcium] Other (See Comments)   Body aches   Lisinopril Other (See Comments)   REACTION: PER DR. Melvyn Novas   Codeine Rash      Medication List    STOP taking these medications   risperiDONE microspheres 50 MG injection Commonly known as:  RISPERDAL CONSTA     TAKE these medications   albuterol 108 (90 Base) MCG/ACT inhaler Commonly known as:  PROVENTIL HFA Inhale 2 puffs into  the lungs every 4 (four) hours as needed. For wheezing   amLODipine 5 MG tablet Commonly known as:  NORVASC Take 1 tablet (5 mg total) by mouth daily.   apixaban 5 MG Tabs tablet Commonly known as:  ELIQUIS Take 1 tablet (5 mg total) by mouth 2 (two) times daily.   BAYER [employer]ILDRENS ASPIRIN 81 MG chewable tablet Generic drug:  aspirin Chew 81 mg by mouth daily.   divalproex 500 MG DR tablet Commonly known as:  DEPAKOTE Take 2 tablets (1,000 mg  total) by mouth 2 (two) times daily. What changed:  how much to take  when to take this   docusate sodium 100 MG capsule Commonly known as:  COLACE Take 1 capsule (100 mg total) by mouth every 12 (twelve) hours. What changed:  when to take this  reasons to take this   fluticasone 110 MCG/ACT inhaler Commonly known as:  FLOVENT HFA Inhale 1 puff into the lungs 2 (two) times daily. What changed:  when to take this  reasons to take this   fluticasone 50 MCG/ACT nasal spray Commonly known as:  FLONASE Place 2 sprays into both nostrils daily. What changed:  when to take this  reasons to take this   hydrochlorothiazide 25 MG tablet Commonly known as:  HYDRODIURIL Take 25 mg by mouth daily.   HYDROcodone-acetaminophen 5-325 MG tablet Commonly known as:  NORCO/VICODIN Take 1 tablet by mouth every 6 (six) hours as needed for moderate pain.   levofloxacin 500 MG tablet Commonly known as:  LEVAQUIN Take 1 tablet (500 mg total) by mouth daily. Start taking on:  12/27/2016   levothyroxine 50 MCG tablet Commonly known as:  SYNTHROID Take 1 tablet (50 mcg total) by mouth daily before breakfast.   LORazepam 0.5 MG tablet Commonly known as:  ATIVAN Take 1 tablet (0.5 mg total) by mouth 2 (two) times daily. What changed:  how much to take  when to take this   metFORMIN 500 MG tablet Commonly known as:  GLUCOPHAGE Take 1 tablet (500 mg total) by mouth daily with breakfast. Start taking on:  12/27/2016   metoprolol tartrate 25 MG tablet Commonly known as:  LOPRESSOR Take 1 tablet (25 mg total) by mouth 2 (two) times daily.   naproxen 500 MG tablet Commonly known as:  NAPROSYN Take 1 tablet (500 mg total) by mouth 2 (two) times daily with a meal. What changed:  when to take this  reasons to take this   nicotine 14 mg/24hr patch Commonly known as:  NICODERM CQ - dosed in mg/24 hours Place 1 patch (14 mg total) onto the skin daily. What changed:  when to take  this  reasons to take this   nitroGLYCERIN 0.4 MG SL tablet Commonly known as:  NITROSTAT Place 1 tablet (0.4 mg total) under the tongue every 5 (five) minutes as needed for chest pain.   olopatadine 0.1 % ophthalmic solution Commonly known as:  PATANOL Place 1 drop into both eyes 2 (two) times daily. What changed:  when to take this  reasons to take this   omeprazole 20 MG capsule Commonly known as:  PRILOSEC Take 1 capsule (20 mg total) by mouth daily.   polyethylene glycol packet Commonly known as:  MIRALAX Take 17 g by mouth daily. What changed:  when to take this  reasons to take this   pravastatin 40 MG tablet Commonly known as:  PRAVACHOL Take 1 tablet (40 mg total) by mouth every evening.   prednisoLONE  acetate 1 % ophthalmic suspension Commonly known as:  PRED FORTE Place 1 drop into both eyes daily.   predniSONE 20 MG tablet Commonly known as:  DELTASONE Take 1 tablet (20 mg total) by mouth daily with breakfast. Start taking on:  12/27/2016   risperiDONE 1 MG tablet Commonly known as:  RISPERDAL Take 1 tablet (1 mg total) by mouth 2 (two) times daily. What changed:  when to take this   trihexyphenidyl 5 MG tablet Commonly known as:  ARTANE Take 1 tablet (5 mg total) by mouth 2 (two) times daily with a meal. Start taking on:  12/27/2016       Discharge Instructions: Please refer to Patient Instructions section of EMR for full details.  Patient was counseled important signs and symptoms that should prompt return to medical care, changes in medications, dietary instructions, activity restrictions, and follow up appointments.   Follow-Up Appointments: Follow-up Information    Ronnie Doss M, DO. Go on 12/27/2016.   Specialty:  Family Medicine Why:  Please go to your previously scheduled appointment at 2:30pm, arrive 15 min for check in. Contact information: [address] [address] [place], [place],  DO 12/26/2016, 6:57 PM PGY-1, [place]

## 2016-12-26 NOTE — Progress Notes (Signed)
  Echocardiogram 2D Echocardiogram has been performed.  Randa Lynn Dance 12/26/2016, 5:28 PM

## 2016-12-26 NOTE — Progress Notes (Signed)
Nutrition Brief Note  Patient identified via COPD Gold consult  Wt Readings from Last 15 Encounters:  12/26/16 175 lb 6.4 oz (79.6 kg)  11/10/16 167 lb (75.8 kg)  10/30/16 160 lb (72.6 kg)  10/15/16 163 lb 8 oz (74.2 kg)  07/18/16 159 lb (72.1 kg)  07/15/16 145 lb (65.8 kg)  06/13/16 142 lb 11.2 oz (64.7 kg)  05/28/16 148 lb 9.6 oz (67.4 kg)  03/09/16 140 lb 6.4 oz (63.7 kg)  02/02/16 144 lb 9.6 oz (65.6 kg)  01/20/16 144 lb 6 oz (65.5 kg)  01/09/16 150 lb (68 kg)  10/21/15 144 lb (65.3 kg)  10/19/15 144 lb 11.2 oz (65.6 kg)  09/29/15 143 lb (64.9 kg)    Body mass index is 31.07 kg/m. Patient meets criteria for obesity unspecified based on current BMI.   Current diet order is heart healthy/carb modified, patient is consuming approximately 75-100% of meals at this time. Labs and medications reviewed.   No nutrition interventions warranted at this time. If nutrition issues arise, please consult RD.   Kerman Passey MS, RD, LDN [phone] Pager  [phone] Weekend/On-Call Pager

## 2016-12-26 NOTE — Evaluation (Signed)
Physical Therapy Evaluation Patient Details Name: [name] Long MRN: [id number] DOB: 02-15-1951 Today's Date: 12/26/2016   History of Present Illness  Pt is a 66 yo female admitted with SoB and productive cough likely from CHF and COPD exacerbation. PMH for Bipolar disorder, Hypothyroidism, HTN, HLDDM2, CKD II .  Clinical Impression  Pt admitted with above diagnosis. Pt currently with functional limitations due to the deficits listed below (see PT Problem List). Pt is min guard for safety with transfers and ambulation of 100 feet with single point cane. Pt is limited by increases in her HR outside her target heart range.  Pt will benefit from skilled PT to increase their independence and safety with mobility to allow discharge to the venue listed below.       Follow Up Recommendations Home health PT;Supervision for mobility/OOB    Equipment Recommendations  Rolling walker with 5" wheels    Recommendations for Other Services OT consult     Precautions / Restrictions Precautions Precautions: None Restrictions Weight Bearing Restrictions: No      Mobility  Bed Mobility               General bed mobility comments: seated EoB at entry  Transfers Overall transfer level: Needs assistance Equipment used: None Transfers: Sit to/from Stand Sit to Stand: Min guard         General transfer comment: for safety, no physical assist  Ambulation/Gait Ambulation/Gait assistance: Min guard Ambulation Distance (Feet): 100 Feet Assistive device: Straight cane Gait Pattern/deviations: Shuffle;Decreased step length - right;Decreased step length - left;Step-through pattern;Narrow base of support;Drifts right/left Gait velocity: slow Gait velocity interpretation: Below normal speed for age/gender General Gait Details: ambulates with cane but uses is sporatic, gait is slow with mild instability, no LoB 3x standing rest breaks for HR to recover after exceeding 130 bpm, recovery within  20 sec      Balance Overall balance assessment: Needs assistance Sitting-balance support: No upper extremity supported;Feet supported Sitting balance-Leahy Scale: Good     Standing balance support: No upper extremity supported Standing balance-Leahy Scale: Fair Standing balance comment: slow to ascent but able to steady herself                             Pertinent Vitals/Pain Pain Assessment: No/denies pain    Home Living Family/patient expects to be discharged to:: Unsure                 Additional Comments: pt reports she lives in a first floor apartment with 2 steps to get in, further questioning on living arrangements did not yield understandable answers    Prior Function Level of Independence: Independent with assistive device(s)         Comments: Pt uses cane for ambulation, reports that she get around using a cab     Hand Dominance        Extremity/Trunk Assessment   Upper Extremity Assessment Upper Extremity Assessment: Generalized weakness    Lower Extremity Assessment Lower Extremity Assessment: Generalized weakness    Cervical / Trunk Assessment Cervical / Trunk Assessment: Normal  Communication   Communication: No difficulties  Cognition Arousal/Alertness: Awake/alert Behavior During Therapy: WFL for tasks assessed/performed;Flat affect Overall Cognitive Status: History of cognitive impairments - at baseline  General Comments: no family in room to get good picture of living arrangements.      General Comments General comments (skin integrity, edema, etc.): at rest BP 118/88, HR 74bpm, SaO2 on RA 95%O2, during activity HR increased with max of 139 bpm, pt with rest break for HR recovery back to 95 bpm. after activity pt with increased wheezing, BP 135/73, HR 89 bpm and SaO2 on RA 95%O2        Assessment/Plan    PT Assessment Patient needs continued PT services  PT Problem  List Decreased balance;Decreased activity tolerance;Cardiopulmonary status limiting activity       PT Treatment Interventions      PT Goals (Current goals can be found in the Care Plan section)  Acute Rehab PT Goals Patient Stated Goal: go home  PT Goal Formulation: With patient Time For Goal Achievement: 01/02/17 Potential to Achieve Goals: Good    Frequency Min 3X/week   Barriers to discharge Decreased caregiver support         AM-PAC PT "6 Clicks" Daily Activity  Outcome Measure Difficulty turning over in bed (including adjusting bedclothes, sheets and blankets)?: A Little Difficulty moving from lying on back to sitting on the side of the bed? : A Little Difficulty sitting down on and standing up from a chair with arms (e.g., wheelchair, bedside commode, etc,.)?: A Little Help needed moving to and from a bed to chair (including a wheelchair)?: None Help needed walking in hospital room?: None Help needed climbing 3-5 steps with a railing? : A Lot 6 Click Score: 19    End of Session Equipment Utilized During Treatment: Gait belt Activity Tolerance: Other (comment) (limited by increases in HR outside ordered parameters) Patient left: in chair;with call bell/phone within reach;with chair alarm set;with nursing/sitter in room Nurse Communication: Mobility status PT Visit Diagnosis: Other abnormalities of gait and mobility (R26.89);Muscle weakness (generalized) (M62.81);Difficulty in walking, not elsewhere classified (R26.2)    Time: 7340-3709 PT Time Calculation (min) (ACUTE ONLY): 30 min   Charges:   PT Evaluation $PT Eval Moderate Complexity: 1 Procedure PT Treatments $Gait Training: 8-22 mins   PT G Codes:        Elizabeth B. Migdalia Dk PT, DPT Acute Rehabilitation  [phone] Pager [phone]    [place] 12/26/2016, 10:03 AM

## 2016-12-26 NOTE — Progress Notes (Signed)
Patient ready for discharge. 

## 2016-12-26 NOTE — Progress Notes (Signed)
Patient converted to SR on telemetry. On call MD paged. Patient refused labs and CBG [phone]. MD paged.

## 2016-12-26 NOTE — Progress Notes (Signed)
Patient refused insulin.  Nurse explained to patient that patient's blood sugar was high and needed insulin, patient still refused.  Patient told nurse "get out of my room". Marcille Blanco, RN

## 2016-12-26 NOTE — Consult Note (Signed)
[facility] Consult   Reason for Consult:  Paranoid and bizarre behaviors Referring Physician:  Dr. Gwendlyn Deutscher Patient Identification: RUDINE RIEGER MRN:  [id number] Principal Diagnosis: Schizoaffective disorder, bipolar type [facility]) Diagnosis:   Patient Active Problem List   Diagnosis Date Noted  . Shortness of breath [R06.02] 12/25/2016  . Pedal edema [R60.0]   . Acute on chronic congestive heart failure ([place]) [I50.9]   . Irregular heart beat [I49.9]   . Chronic midline low back pain without sciatica [M54.5, G89.29] 08/15/2016  . 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 Impingement syndrome of right shoulder [M75.41] 08/15/2016  . Schizoaffective disorder, bipolar type ([place]) [F25.0]   . URI (upper respiratory infection) [J06.9] 03/09/2016  . Muscle strain of chest wall [S29.011A] 10/21/2015  . Activities of daily living deficit involving bathing of lower body [R46.0] 08/10/2015  . Hypokalemia [E87.6] 01/22/2015  . Vitamin D deficiency [E55.9] 12/19/2014  . Preventative health care [Z00.00] 03/21/2014  . Bronchitis, chronic obstructive w acute bronchitis ([place]) [J44.0] 01/07/2014  . Osteoarthritis, multiple sites [M15.9] 06/08/2011  . Vaginal discharge [N89.8] 01/14/2011  . COPD exacerbation ([place]) [J44.1] 09/23/2010  . CHRONIC KIDNEY DISEASE STAGE II (MILD) [N18.2] 09/14/2009  . Hypothyroidism [E03.9] 08/31/2006  . Type 2 diabetes, diet controlled ([place]) [E11.9] 08/31/2006  . HYPERCHOLESTEROLEMIA [E78.00] 08/31/2006  . Tobacco use disorder [F17.200] 08/31/2006  . HYPERTENSION, BENIGN SYSTEMIC [I10] 08/31/2006    Total Time spent with patient: 1 hour  Subjective:   LARYSA PALL is a 66 y.o. female patient admitted with shortness of breath and productive cough.  HPI:  LARIAH FLEER is a 66 y.o. female, seen, chart reviewed and case discussed with the staff RN and reviewed IVC petition and  certification by emergency department for psychosis and paranoia. Patient daughter is not at bedside and patient has a Actuary in the room. Patient reported she came to the hospital because of bronchitis cough pneumonia and asthma and also diabetes which is uncontrollable secondary to poorly compliant with medications. Patient stated that she lives with her daughter Wyatt Mage and her granddaughter. Patient reportedly has outpatient psychiatrist Dr. Josph Macho who has been seeing her every 3 months and give the medication risperidone and Depakote and lorazepam. Patient continued to endorse mild paranoia, difficulty trusting other people and refusing medications on and off during this hospitalization. Patient denies active suicidal/homicidal ideation, intention or plans. Patient has no homicidal ideation, intention or plan. Patient has no evidence of auditory or visual hallucinations, delusions but has a ongoing mild paranoia which seems to be at baseline. Patient is willing to take her medication as prescribed by the hospital doctors. Patient denied irritability agitation and aggressive behaviors   Review of urine drug screen shows positive for benzodiazepines valproic acid level is 70 which is therapeutic range.   Past Psychiatric History: Patient has been receiving outpatient medication management from [facility] and she was previously admitted to [facility] in 2017, for schizoaffective bipolar manic symptoms.  Risk to Self: Is patient at risk for suicide?: No Risk to Others:   Prior Inpatient Therapy:   Prior Outpatient Therapy:    Past Medical History:  Past Medical History:  Diagnosis Date  . BIPOLAR DISORDER 08/31/2006   Qualifier: Diagnosis of  By: Dorathy Daft MD, Marjory Lies    . CHRONIC KIDNEY DISEASE STAGE II (MILD) 09/14/2009   Annotation: eGFR 64 Qualifier: Diagnosis of  By: Jess Barters MD, [name]    . COPD (chronic obstructive pulmonary disease) ([place]) 09/23/2010  Diagnosed at [facility] in 2008 (Dr. Annamaria Boots)   . DM  (diabetes mellitus) type II controlled with renal manifestation ([place]) 08/31/2006   Qualifier: Diagnosis of  By: Dorathy Daft MD, Marjory Lies    . HYPERCHOLESTEROLEMIA 08/31/2006   Intolerance to Lipitor OK on Crestor but medicaid no longer covering    . HYPERTENSION, BENIGN SYSTEMIC 08/31/2006   Qualifier: Diagnosis of  By: Dorathy Daft MD, Marjory Lies    . HYPOTHYROIDISM, UNSPECIFIED 08/31/2006   Qualifier: Diagnosis of  By: Dorathy Daft MD, Marjory Lies      Past Surgical History:  Procedure Laterality Date  . CESAREAN SECTION    . FOOT SURGERY    . KNEE SURGERY Bilateral    Family History:  Family History  Problem Relation Age of Onset  . Breast cancer Sister    Family Psychiatric  History: unknown. Social History:  History  Alcohol Use  . Yes     History  Drug Use No    Social History   Social History  . Marital status: Divorced    Spouse name: N/A  . Number of children: N/A  . Years of education: N/A   Social History Main Topics  . Smoking status: Current Every Day Smoker    Packs/day: 0.50    Years: 39.00    Types: Cigarettes  . Smokeless tobacco: Never Used  . Alcohol use Yes  . Drug use: No  . Sexual activity: Not Asked   Other Topics Concern  . None   Social History Narrative  . None   Additional Social History:    Allergies:   Allergies  Allergen Reactions  . Ibuprofen Other (See Comments)    Unknown  . Lipitor [Atorvastatin Calcium] Other (See Comments)    Body aches  . Lisinopril Other (See Comments)    REACTION: PER DR. Melvyn Novas  . Codeine Rash    Labs:  Results for orders placed or performed during the hospital encounter of 12/25/16 (from the past 48 hour(s))  CBC with Differential     Status: Abnormal   Collection Time: 12/25/16  3:33 AM  Result Value Ref Range   WBC 5.7 4.0 - 10.5 K/uL   RBC 3.69 (L) 3.87 - 5.11 MIL/uL   Hemoglobin 12.6 12.0 - 15.0 g/dL   HCT 38.5 36.0 - 46.0 %   MCV 104.3 (H) 78.0 - 100.0 fL   MCH 34.1 (H) 26.0 - 34.0 pg   MCHC 32.7 30.0 -  36.0 g/dL   RDW 14.9 11.5 - 15.5 %   Platelets 167 150 - 400 K/uL   Neutrophils Relative % 43 %   Neutro Abs 2.4 1.7 - 7.7 K/uL   Lymphocytes Relative 43 %   Lymphs Abs 2.5 0.7 - 4.0 K/uL   Monocytes Relative 12 %   Monocytes Absolute 0.7 0.1 - 1.0 K/uL   Eosinophils Relative 2 %   Eosinophils Absolute 0.1 0.0 - 0.7 K/uL   Basophils Relative 0 %   Basophils Absolute 0.0 0.0 - 0.1 K/uL  Basic metabolic panel     Status: Abnormal   Collection Time: 12/25/16  3:33 AM  Result Value Ref Range   Sodium 136 135 - 145 mmol/L   Potassium 2.9 (L) 3.5 - 5.1 mmol/L   Chloride 96 (L) 101 - 111 mmol/L   CO2 30 22 - 32 mmol/L   Glucose, Bld 237 (H) 65 - 99 mg/dL   BUN 8 6 - 20 mg/dL   Creatinine, Ser 0.91 0.44 - 1.00 mg/dL   Calcium  8.7 (L) 8.9 - 10.3 mg/dL   GFR calc non Af Amer >60 >60 mL/min   GFR calc Af Amer >60 >60 mL/min    Comment: (NOTE) The eGFR has been calculated using the CKD EPI equation. This calculation has not been validated in all clinical situations. eGFR's persistently <60 mL/min signify possible Chronic Kidney Disease.    Anion gap 10 5 - 15  I-stat troponin, ED     Status: None   Collection Time: 12/25/16  3:46 AM  Result Value Ref Range   Troponin i, poc 0.00 0.00 - 0.08 ng/mL   Comment 3            Comment: Due to the release kinetics of cTnI, a negative result within the first hours of the onset of symptoms does not rule out myocardial infarction with certainty. If myocardial infarction is still suspected, repeat the test at appropriate intervals.   Brain natriuretic peptide     Status: Abnormal   Collection Time: 12/25/16  4:09 AM  Result Value Ref Range   B Natriuretic Peptide 172.8 (H) 0.0 - 100.0 pg/mL  Valproic acid level     Status: None   Collection Time: 12/25/16  4:09 AM  Result Value Ref Range   Valproic Acid Lvl 70 50.0 - 100.0 ug/mL  Ethanol     Status: None   Collection Time: 12/25/16  4:09 AM  Result Value Ref Range   Alcohol, Ethyl (B)  <5 <5 mg/dL    Comment:        LOWEST DETECTABLE LIMIT FOR SERUM ALCOHOL IS 5 mg/dL FOR MEDICAL PURPOSES ONLY   Urinalysis, Routine w reflex microscopic     Status: None   Collection Time: 12/25/16  6:04 AM  Result Value Ref Range   Color, Urine YELLOW YELLOW   APPearance CLEAR CLEAR   Specific Gravity, Urine 1.012 1.005 - 1.030   pH 6.0 5.0 - 8.0   Glucose, UA NEGATIVE NEGATIVE mg/dL   Hgb urine dipstick NEGATIVE NEGATIVE   Bilirubin Urine NEGATIVE NEGATIVE   Ketones, ur NEGATIVE NEGATIVE mg/dL   Protein, ur NEGATIVE NEGATIVE mg/dL   Nitrite NEGATIVE NEGATIVE   Leukocytes, UA NEGATIVE NEGATIVE  Rapid urine drug screen (hospital performed)     Status: Abnormal   Collection Time: 12/25/16  6:04 AM  Result Value Ref Range   Opiates NONE DETECTED NONE DETECTED   Cocaine NONE DETECTED NONE DETECTED   Benzodiazepines POSITIVE (A) NONE DETECTED   Amphetamines NONE DETECTED NONE DETECTED   Tetrahydrocannabinol NONE DETECTED NONE DETECTED   Barbiturates NONE DETECTED NONE DETECTED    Comment:        DRUG SCREEN FOR MEDICAL PURPOSES ONLY.  IF CONFIRMATION IS NEEDED FOR ANY PURPOSE, NOTIFY LAB WITHIN 5 DAYS.        LOWEST DETECTABLE LIMITS FOR URINE DRUG SCREEN Drug Class       Cutoff (ng/mL) Amphetamine      1000 Barbiturate      200 Benzodiazepine   292 Tricyclics       446 Opiates          300 Cocaine          300 THC              50   Magnesium     Status: None   Collection Time: 12/25/16 12:22 PM  Result Value Ref Range   Magnesium 1.7 1.7 - 2.4 mg/dL  TSH     Status: None  Collection Time: 12/25/16 12:22 PM  Result Value Ref Range   TSH 0.354 0.350 - 4.500 uIU/mL    Comment: Performed by a 3rd Generation assay with a functional sensitivity of <=0.01 uIU/mL.  HIV antibody (Routine Testing)     Status: None   Collection Time: 12/25/16 12:22 PM  Result Value Ref Range   HIV Screen 4th Generation wRfx Non Reactive Non Reactive    Comment: (NOTE) Performed At: [facility] [place], [place] [id number] Lindon Romp MD [id number]   Troponin I     Status: None   Collection Time: 12/25/16 12:22 PM  Result Value Ref Range   Troponin I <0.03 <0.03 ng/mL  Basic metabolic panel     Status: Abnormal   Collection Time: 12/25/16 12:22 PM  Result Value Ref Range   Sodium 133 (L) 135 - 145 mmol/L   Potassium 3.5 3.5 - 5.1 mmol/L    Comment: DELTA CHECK NOTED   Chloride 93 (L) 101 - 111 mmol/L   CO2 27 22 - 32 mmol/L   Glucose, Bld 461 (H) 65 - 99 mg/dL   BUN 12 6 - 20 mg/dL   Creatinine, Ser 1.26 (H) 0.44 - 1.00 mg/dL   Calcium 8.9 8.9 - 10.3 mg/dL   GFR calc non Af Amer 44 (L) >60 mL/min   GFR calc Af Amer 51 (L) >60 mL/min    Comment: (NOTE) The eGFR has been calculated using the CKD EPI equation. This calculation has not been validated in all clinical situations. eGFR's persistently <60 mL/min signify possible Chronic Kidney Disease.    Anion gap 13 5 - 15  T4, free     Status: None   Collection Time: 12/25/16 12:22 PM  Result Value Ref Range   Free T4 0.76 0.61 - 1.12 ng/dL    Comment: (NOTE) Biotin ingestion may interfere with free T4 tests. If the results are inconsistent with the TSH level, previous test results, or the clinical presentation, then consider biotin interference. If needed, order repeat testing after stopping biotin.   Glucose, capillary     Status: Abnormal   Collection Time: 12/25/16 12:30 PM  Result Value Ref Range   Glucose-Capillary 469 (H) 65 - 99 mg/dL  Glucose, capillary     Status: Abnormal   Collection Time: 12/25/16  2:36 PM  Result Value Ref Range   Glucose-Capillary 551 (HH) 65 - 99 mg/dL   Comment 1 Notify RN    Comment 2 Document in Chart   Glucose, capillary     Status: Abnormal   Collection Time: 12/25/16  3:40 PM  Result Value Ref Range   Glucose-Capillary 521 (HH) 65 - 99 mg/dL   Comment 1 Notify RN   Glucose, capillary     Status: Abnormal   Collection Time:  12/25/16  5:03 PM  Result Value Ref Range   Glucose-Capillary 483 (H) 65 - 99 mg/dL  Glucose, capillary     Status: Abnormal   Collection Time: 12/25/16  6:59 PM  Result Value Ref Range   Glucose-Capillary 440 (H) 65 - 99 mg/dL  Glucose, capillary     Status: Abnormal   Collection Time: 12/25/16  9:16 PM  Result Value Ref Range   Glucose-Capillary 382 (H) 65 - 99 mg/dL  Glucose, capillary     Status: Abnormal   Collection Time: 12/25/16 10:59 PM  Result Value Ref Range   Glucose-Capillary 331 (H) 65 - 99 mg/dL  Troponin I  Status: None   Collection Time: 12/26/16  7:18 AM  Result Value Ref Range   Troponin I <0.03 <0.03 ng/mL  CBC     Status: Abnormal   Collection Time: 12/26/16  7:18 AM  Result Value Ref Range   WBC 9.0 4.0 - 10.5 K/uL   RBC 3.52 (L) 3.87 - 5.11 MIL/uL   Hemoglobin 12.0 12.0 - 15.0 g/dL   HCT 36.3 36.0 - 46.0 %   MCV 103.1 (H) 78.0 - 100.0 fL   MCH 34.1 (H) 26.0 - 34.0 pg   MCHC 33.1 30.0 - 36.0 g/dL   RDW 15.0 11.5 - 15.5 %   Platelets 183 150 - 400 K/uL  Comprehensive metabolic panel     Status: Abnormal   Collection Time: 12/26/16  7:18 AM  Result Value Ref Range   Sodium 133 (L) 135 - 145 mmol/L   Potassium 3.6 3.5 - 5.1 mmol/L   Chloride 93 (L) 101 - 111 mmol/L   CO2 30 22 - 32 mmol/L   Glucose, Bld 337 (H) 65 - 99 mg/dL   BUN 17 6 - 20 mg/dL   Creatinine, Ser 1.13 (H) 0.44 - 1.00 mg/dL   Calcium 8.9 8.9 - 10.3 mg/dL   Total Protein 7.1 6.5 - 8.1 g/dL   Albumin 3.0 (L) 3.5 - 5.0 g/dL   AST 16 15 - 41 U/L   ALT 11 (L) 14 - 54 U/L   Alkaline Phosphatase 76 38 - 126 U/L   Total Bilirubin 0.2 (L) 0.3 - 1.2 mg/dL   GFR calc non Af Amer 50 (L) >60 mL/min   GFR calc Af Amer 58 (L) >60 mL/min    Comment: (NOTE) The eGFR has been calculated using the CKD EPI equation. This calculation has not been validated in all clinical situations. eGFR's persistently <60 mL/min signify possible Chronic Kidney Disease.    Anion gap 10 5 - 15  Glucose,  capillary     Status: Abnormal   Collection Time: 12/26/16  8:02 AM  Result Value Ref Range   Glucose-Capillary 369 (H) 65 - 99 mg/dL   Comment 1 Notify RN   Glucose, capillary     Status: Abnormal   Collection Time: 12/26/16 12:03 PM  Result Value Ref Range   Glucose-Capillary 309 (H) 65 - 99 mg/dL   Comment 1 Notify RN     Current Facility-Administered Medications  Medication Dose Route Frequency Provider Last Rate Last Dose  . acetaminophen (TYLENOL) tablet 650 mg  650 mg Oral Q6H PRN Diallo, Abdoulaye, MD       Or  . acetaminophen (TYLENOL) suppository 650 mg  650 mg Rectal Q6H PRN Diallo, Abdoulaye, MD      . albuterol (PROVENTIL) (2.5 MG/3ML) 0.083% nebulizer solution 2.5 mg  2.5 mg Nebulization BID PRN Eniola, Kehinde T, MD      . alum & mag hydroxide-simeth (MAALOX/MYLANTA) 200-200-20 MG/5ML suspension 30 mL  30 mL Oral Q6H PRN Andrena Mews T, MD   30 mL at 12/25/16 1446  . apixaban (ELIQUIS) tablet 5 mg  5 mg Oral BID Lucila Maine C, DO   5 mg at 12/26/16 1135  . aspirin chewable tablet 81 mg  81 mg Oral Daily Diallo, Abdoulaye, MD   81 mg at 12/26/16 1135  . divalproex (DEPAKOTE) DR tablet 1,000 mg  1,000 mg Oral BID Diallo, Abdoulaye, MD   1,000 mg at 12/26/16 1135  . HYDROcodone-acetaminophen (NORCO/VICODIN) 5-325 MG per tablet 1 tablet  1 tablet Oral  Q8H PRN Lucila Maine C, DO   1 tablet at 12/25/16 1520  . ipratropium-albuterol (DUONEB) 0.5-2.5 (3) MG/3ML nebulizer solution 3 mL  3 mL Nebulization Q2H PRN Andrena Mews T, MD   3 mL at 12/26/16 1417  . levofloxacin (LEVAQUIN) tablet 500 mg  500 mg Oral Daily Delane Ginger, RPH   500 mg at 12/26/16 1138  . levothyroxine (SYNTHROID, LEVOTHROID) tablet 50 mcg  50 mcg Oral QAC breakfast Diallo, Abdoulaye, MD   50 mcg at 12/26/16 7353  . LORazepam (ATIVAN) tablet 1 mg  1 mg Oral TID Diallo, Abdoulaye, MD   1 mg at 12/26/16 1138  . [START ON 12/27/2016] metFORMIN (GLUCOPHAGE) tablet 500 mg  500 mg Oral Q breakfast Orson Eva J, DO      . metoprolol tartrate (LOPRESSOR) tablet 25 mg  25 mg Oral BID Lucila Maine C, DO   25 mg at 12/25/16 2239  . nicotine (NICODERM CQ - dosed in mg/24 hours) patch 14 mg  14 mg Transdermal Daily Yoo, Elsia J, DO      . pantoprazole (PROTONIX) EC tablet 40 mg  40 mg Oral Daily Diallo, Abdoulaye, MD   40 mg at 12/26/16 1138  . pravastatin (PRAVACHOL) tablet 40 mg  40 mg Oral QPM Diallo, Abdoulaye, MD   40 mg at 12/25/16 1816  . [START ON 12/27/2016] predniSONE (DELTASONE) tablet 20 mg  20 mg Oral Q breakfast Yoo, Elsia J, DO      . risperiDONE (RISPERDAL) tablet 1 mg  1 mg Oral BID Ambrose Finland, MD      . trihexyphenidyl (ARTANE) tablet 5 mg  5 mg Oral BID WC Akintayo, Mojeed, MD   5 mg at 12/25/16 1542    Musculoskeletal: Strength & Muscle Tone: decreased Gait & Station: unable to stand Patient leans: N/A  Psychiatric Specialty Exam: Physical Exam as per history and physical   ROS No Fever-chills, No Headache, No changes with Vision or hearing, reports vertigo No problems swallowing food or Liquids, No Chest pain, Cough or Shortness of Breath, No Abdominal pain, No Nausea or Vommitting, Bowel movements are regular, No Blood in stool or Urine, No dysuria, No new skin rashes or bruises, No new joints pains-aches,  No new weakness, tingling, numbness in any extremity, No recent weight gain or loss, No polyuria, polydypsia or polyphagia,   A full 10 point Review of Systems was done, except as stated above, all other Review of Systems were negative.  Blood pressure 97/79, pulse 78, temperature 97.9 F (36.6 C), temperature source Oral, resp. rate 20, height '5\' 3"'  (1.6 m), weight 79.6 kg (175 lb 6.4 oz), SpO2 94 %.Body mass index is 31.07 kg/m.  General Appearance: Bizarre and Guarded  Eye Contact:  Good  Speech:  Clear and Coherent  Volume:  Normal  Mood:  Irritable  Affect:  Inappropriate and Labile  Thought Process:  Coherent and Goal Directed   Orientation:  Full (Time, Place, and Person)  Thought Content:  Paranoid Ideation and Rumination  Suicidal Thoughts:  No  Homicidal Thoughts:  No  Memory:  Immediate;   Good Recent;   Fair Remote;   Fair  Judgement:  Fair  Insight:  Fair  Psychomotor Activity:  Normal  Concentration:  Concentration: Good and Attention Span: Fair  Recall:  Good  Fund of Knowledge:  Good  Language:  Good  Akathisia:  Negative  Handed:  Right  AIMS (if indicated):     Assets:  Communication Skills Desire  for Improvement Financial Resources/Insurance Housing Leisure Time Resilience Social Support Transportation  ADL's:  Impaired  Cognition:  WNL  Sleep:        Treatment Plan Summary: 66 years old female with a schizoaffective disorder admitted to the hospital with the shortness of breath and uncontrollable diabetes and continued to have paranoid ideations and more frequently refusing her medication management while in the hospital. Patient seems to be at baseline and does not meet criteria for inpatient psychiatric hospitalization.  Patient has no safety concerns as patient denies active suicidal/homicidal ideation, intention or plans. May discharge safety sitter as patient contract for safety. Patient required redirection when she has been yelling at the staff members saying that you're not my sitter when sitter was switched.   Patient is willing to be compliant with her medication after brief discussion with the patient and need for the treatment  We will discontinue Haldol which is refused by the patient and will start risperidone 1 mg twice daily for schizophrenia with paranoid psychosis  We'll continue   Depakote ER thousand milligrams twice daily and her valproic acid level is within normal therapeutic range.  Disposition: No evidence of imminent risk to self or others at present.   Patient does not meet criteria for psychiatric inpatient admission. Supportive therapy provided  about ongoing stressors.  Ambrose Finland, MD 12/26/2016 2:56 PM

## 2016-12-26 NOTE — Discharge Instructions (Signed)
It has been a pleasure taking care of you! You were admitted due to COPD exacerbation. We have treated you with steroids and antibiotics. With that your symptoms improved to the point we think it is safe to let you go home and follow up with your primary care doctor. We are discharging you on prednisone (for total of 5 days, last dose 12/31/16) and levaquin antibiotic (total of 7 days, last dose 01/01/17) that you need to continue taking after you leave the hospital. There could be some changes made to your home medications during this hospitalization. Please, make sure to read the directions before you take them. The names and directions on how to take these medications are found on this discharge paper under medication section.  Please follow up at your primary care doctor's office. The address, date and time are found on the discharge paper under follow up section.

## 2016-12-26 NOTE — Evaluation (Signed)
Occupational Therapy Evaluation Patient Details Name: [name] Long MRN: [id number] DOB: Mar 22, 1951 Today's Date: 12/26/2016    History of Present Illness Pt is a 66 yo female admitted with SoB and productive cough likely from CHF and COPD exacerbation. PMH for Bipolar disorder, Hypothyroidism, HTN, HLDDM2, CKD II .   Clinical Impression   Upon arrival, pt sitting at EOB with sitting in room. Pt having difficulty maintaining conservation and answering questions about home set up and support. Per pt, she lives with her daughter in an apartment. She reports having an aide to assist with ADL but states "I don't get in the tub and haven't had a bath for years because that girl doesn't come into work." Pt performed grooming at sink with [employer] A for safety. Pt demonstrating decreased activity tolerance as seen by quick fatigue and SOB as well as HR ranging from 94-135 throughout session. Pending more information about support and home environment, recommend dc home with HHOT to increase pt safety and independence with ADLs and functional mobility. Will follow acutely to increase occuaptional performance and facilitate safe dc.    Follow Up Recommendations  Home health OT;Supervision/Assistance - 24 hour;Other (comment) (Unsure of caregiver support and home environement)    Equipment Recommendations  Other (comment) (Need more information from family on home set up)    Recommendations for Other Services PT consult     Precautions / Restrictions Precautions Precautions: None Restrictions Weight Bearing Restrictions: No      Mobility Bed Mobility               General bed mobility comments: seated EoB at entry  Transfers Overall transfer level: Needs assistance Equipment used: None Transfers: Sit to/from Stand Sit to Stand: Min guard         General transfer comment: for safety, no physical assist    Balance Overall balance assessment: Needs  assistance Sitting-balance support: No upper extremity supported;Feet supported Sitting balance-Leahy Scale: Good     Standing balance support: No upper extremity supported Standing balance-Leahy Scale: Fair Standing balance comment: slow to ascent but able to steady herself                           ADL either performed or assessed with clinical judgement   ADL Overall ADL's : Needs assistance/impaired Eating/Feeding: Set up;Sitting   Grooming: Oral care;Min guard;Standing;Cueing for sequencing;Cueing for safety;Applying deodorant Grooming Details (indicate cue type and reason): Pt performed grooming at sink demonstrating decreased activity tolerance and quickly fatgueing. Pt's HR fluctuating quickly between 94-135. Pt benefits from VCs for initation. Upper Body Bathing: Set up;Sitting   Lower Body Bathing: Minimal assistance;Sit to/from stand   Upper Body Dressing : Set up;Sitting   Lower Body Dressing: Minimal assistance;Sit to/from stand Lower Body Dressing Details (indicate cue type and reason): Pt able to adjust socks, but it is difficult for her and she fatigues quickly.              Functional mobility during ADLs: Minimal assistance (Min A for safety) General ADL Comments: Pt has decreased activity tolerance as seen by her SOB and elevated HR with simple movements. Pt demonstrating decreased cogntition and has difficulty maintaining conversation or answering questions.      Vision         Perception     Praxis      Pertinent Vitals/Pain Pain Assessment: No/denies pain     Hand Dominance Right   Extremity/Trunk  Assessment Upper Extremity Assessment Upper Extremity Assessment: Generalized weakness   Lower Extremity Assessment Lower Extremity Assessment: Generalized weakness   Cervical / Trunk Assessment Cervical / Trunk Assessment: Normal   Communication Communication Communication: No difficulties   Cognition Arousal/Alertness:  Awake/alert Behavior During Therapy: WFL for tasks assessed/performed;Flat affect Overall Cognitive Status: History of cognitive impairments - at baseline                                 General Comments: Pt unable ti maintain conversation or answer questions. Pt would begin to answer a question and then start talking on many different and diverse topics. (Pt oriented to self, place, and time. However, will guess and then need to correct herself. When asked what year, she states, "24..no no no. 2018. May...no. June 2018.")   General Comments  SpO2 97 on roomair. BP 124/101. HR ranging from 94-135 throughout session    Exercises     Shoulder Instructions      Home Living Family/patient expects to be discharged to:: Unsure Living Arrangements: Children (Daughter)   Type of Home: Apartment                           Additional Comments: pt reports she lives in a first floor apartment with 2 steps to get in, further questioning on living arrangements did not yield understandable answers. NT confirms that pt has a daughter who came in the AM.      Prior Functioning/Environment Level of Independence: Independent with assistive device(s)        Comments: Pt reporting that she does her ADLs. Then reports she has an aid who assists. Stated she "hadn't had a bath in a year because she (the aid) doesn't come to work."        OT Problem List: Decreased activity tolerance;Impaired balance (sitting and/or standing);Decreased safety awareness;Decreased knowledge of precautions;Cardiopulmonary status limiting activity;Pain      OT Treatment/Interventions: Self-care/ADL training;Therapeutic exercise;Energy conservation;DME and/or AE instruction;Therapeutic activities;Patient/family education    OT Goals(Current goals can be found in the care plan section) Acute Rehab OT Goals Patient Stated Goal: go home  OT Goal Formulation: With patient Time For Goal Achievement:  01/09/17 Potential to Achieve Goals: Good ADL Goals Pt Will Perform Grooming: with modified independence;standing Pt Will Perform Lower Body Bathing: with caregiver independent in assisting;sit to/from stand;with supervision;with set-up Pt Will Perform Lower Body Dressing: with set-up;with supervision;sit to/from stand;with caregiver independent in assisting Pt Will Transfer to Toilet: with supervision;ambulating;regular height toilet  OT Frequency: Min 2X/week   Barriers to D/C: Other (comment) (Unsure of home environment and support)          Co-evaluation              AM-PAC PT "6 Clicks" Daily Activity     Outcome Measure Help from another person eating meals?: None Help from another person taking care of personal grooming?: A Little Help from another person toileting, which includes using toliet, bedpan, or urinal?: A Little Help from another person bathing (including washing, rinsing, drying)?: A Little Help from another person to put on and taking off regular upper body clothing?: A Little Help from another person to put on and taking off regular lower body clothing?: A Little 6 Click Score: 19   End of Session Equipment Utilized During Treatment: Gait belt Nurse Communication: Mobility status;Other (comment) (Pt requesting new IV. )  Activity Tolerance: Patient tolerated treatment well;Patient limited by fatigue;Other (comment) (Decreased cogntition) Patient left: with nursing/sitter in room (At EOB)  OT Visit Diagnosis: Unsteadiness on feet (R26.81);Other abnormalities of gait and mobility (R26.89);Muscle weakness (generalized) (M62.81);Pain;Other symptoms and signs involving cognitive function Pain - Right/Left:  (Generalized) Pain - part of body:  (Generlized)                Time: 5038-8828 OT Time Calculation (min): 22 min Charges:  OT General Charges $OT Visit: 1 Procedure OT Evaluation $OT Eval Low Complexity: 1 Procedure G-Codes:     [place], OTR/L Acute Rehab Pager: [phone] Office: [place] 12/26/2016, 3:57 PM

## 2016-12-27 ENCOUNTER — Ambulatory Visit: Payer: Self-pay | Admitting: Family Medicine

## 2016-12-28 ENCOUNTER — Ambulatory Visit: Payer: Medicare Other | Admitting: Family Medicine

## 2016-12-30 ENCOUNTER — Telehealth (HOSPITAL_COMMUNITY): Payer: Self-pay | Admitting: *Deleted

## 2016-12-30 NOTE — Telephone Encounter (Signed)
I cld pt to sched appt based on referral received from [facility].  Pt refused appt stating that she will follow up with her own Cardiologist and that she wants to file a complaint on the care she received while in the hospital.

## 2017-01-02 ENCOUNTER — Ambulatory Visit: Payer: Medicare Other | Admitting: Internal Medicine

## 2017-01-05 DIAGNOSIS — F209 Schizophrenia, unspecified: Secondary | ICD-10-CM | POA: Diagnosis not present

## 2017-01-10 ENCOUNTER — Ambulatory Visit: Payer: Medicare Other | Admitting: Family Medicine

## 2017-01-11 ENCOUNTER — Telehealth: Payer: Self-pay | Admitting: Family Medicine

## 2017-01-11 ENCOUNTER — Ambulatory Visit (INDEPENDENT_AMBULATORY_CARE_PROVIDER_SITE_OTHER): Payer: Medicare Other | Admitting: Orthopedic Surgery

## 2017-01-11 NOTE — Telephone Encounter (Signed)
Pt is having transportation issues. I let her know about the resources she could call, but would like me to call her back tomorrow around 3p since she doesn't have a pen to write down information. She also reports having trouble with her grandchildren and has contacted social services. - [name] Long

## 2017-01-12 NOTE — Telephone Encounter (Signed)
Pt declined HFU and reports she will come back if there is an emergency. She notes she's still sick. States she had a terrible experience at the hospital and was given steroids that she didn't need. - [name] Long

## 2017-01-12 NOTE — Progress Notes (Signed)
Social work consult from [employer], reference  Patient needing assistance with transportation to her hospital f/u appointment.   Joint phone call with Mesha, to assess for the about consult.  Patient has Medicaid Transportation but is experiencing some difficulty at this time.  She is working to resolve the issue.  No appointment scheduled at this time.   Intervention: , Data processing manager for transportation    Casimer Lanius, [place] Licensed Clinical Social Worker [place]   [phone] 4:03 PM

## 2017-01-16 ENCOUNTER — Encounter: Payer: Self-pay | Admitting: Internal Medicine

## 2017-01-16 ENCOUNTER — Ambulatory Visit (INDEPENDENT_AMBULATORY_CARE_PROVIDER_SITE_OTHER): Payer: Medicare Other | Admitting: Internal Medicine

## 2017-01-16 VITALS — BP 90/56 | HR 93 | Temp 98.2°F | Wt 171.0 lb

## 2017-01-16 DIAGNOSIS — M542 Cervicalgia: Secondary | ICD-10-CM

## 2017-01-16 DIAGNOSIS — W19XXXA Unspecified fall, initial encounter: Secondary | ICD-10-CM

## 2017-01-16 MED ORDER — HYDROCODONE-ACETAMINOPHEN 5-325 MG PO TABS: 1.0000 | ORAL_TABLET | Freq: Four times a day (QID) | ORAL | 0 refills | Status: DC | PRN

## 2017-01-16 MED ORDER — NAPROXEN 500 MG PO TABS: 500.0000 mg | ORAL_TABLET | Freq: Two times a day (BID) | ORAL | 0 refills | Status: DC

## 2017-01-16 NOTE — Patient Instructions (Signed)
It was so nice to see you today!  I have ordered some x-rays for you. I will call you with these results.  I have refilled your pain medication for you. You should see your PCP or your orthopedic doctor for any further refills.  -Dr. Brett Albino

## 2017-01-16 NOTE — Assessment & Plan Note (Addendum)
Landed on her right shoulder and right hip. Difficult exam due to generalized tenderness throughout entire neck, back, right arm, right hip, and right knee but no obvious deformity. - Will obtain x-rays of right shoulder, right hip, and lumbar spine - Will call patient with results - Allergic to Ibuprofen and requesting prescription of chronic narcotics. Given Percocet #10. [place] controlled substances database reviewed. No concerning activity. - Follow-up with PCP if no improvement. - Precepted with Dr. Erin Hearing.

## 2017-01-16 NOTE — Progress Notes (Signed)
   [facility] Phone: [phone]  Subjective:  [name] Long is a 66 year old female presenting to clinic for same day appointment with pain after falling. She fell 3 days ago when her chair slipped out from under her. She landed on her right shoulder and right hip on a hardwood floor. She did not hit her head. No LOC. Since the fall, she has had pain from the right side of her neck all the way down to her lower back. She also has pain along her entire right arm and right leg. The pain is "sharp". The pain is getting worse. She has tried using Aspirin and Tylenol, which has not been helping. She states she is having trouble walking and has had to start using a cane. She denies any weakness or numbness of her arm or leg. States she needs her pain medication.  ROS: See HPI for pertinent positives and negatives  Past Medical History- HTN, CHF, hypothyroidism, T2DM, CKD II, HLD  Family history reviewed for today's visit. No changes.  Social history- patient is a current smoker  Objective: BP (!) 90/56   Pulse 93   Temp 98.2 F (36.8 C) (Oral)   Wt 171 lb (77.6 kg)   SpO2 96%   BMI 30.29 kg/m  Gen: NAD, alert, cooperative with exam HEENT: NCAT, EOMI, MMM Neck/Back: Generalized tenderness to palpation along the paraspinal muscles of the neck and back on the right side. No step-offs or midline tenderness. Decreased ROM in all planes. Right Shoulder: Generalized tenderness to palpation throughout entire shoulder. No gross deformities, edema, or erythema. Limited range of motion in all planes. Unable to tolerate special testing secondary to pain. Right Hip: Generalized tenderness to palpation throughout entire right hip. Normal range of motion. Neuro: Straight leg test negative bilaterally. Walks with a limp.  Assessment/Plan: Fall: Landed on her right shoulder and right hip. Difficult exam due to generalized tenderness throughout entire neck, back, right arm, right hip, and  right knee, but no obvious deformity. - Will obtain x-rays of right shoulder, right hip, and lumbar spine - Will call patient with results - Allergic to Ibuprofen and requesting prescription of chronic narcotics. Given Percocet #10. [place] controlled substances database reviewed. No concerning activity. - Follow-up with PCP if no improvement. - Precepted with Dr. Vern Claude, MD PGY-3

## 2017-01-17 ENCOUNTER — Ambulatory Visit
Admission: RE | Admit: 2017-01-17 | Discharge: 2017-01-17 | Disposition: A | Discharge status: Home / Self Care | Payer: Medicare Other | Source: Ambulatory Visit | Attending: Family Medicine | Admitting: Family Medicine

## 2017-01-17 ENCOUNTER — Telehealth: Payer: Self-pay

## 2017-01-17 DIAGNOSIS — S3992XA Unspecified injury of lower back, initial encounter: Secondary | ICD-10-CM | POA: Diagnosis not present

## 2017-01-17 DIAGNOSIS — S4991XA Unspecified injury of right shoulder and upper arm, initial encounter: Secondary | ICD-10-CM | POA: Diagnosis not present

## 2017-01-17 DIAGNOSIS — W19XXXA Unspecified fall, initial encounter: Secondary | ICD-10-CM

## 2017-01-17 DIAGNOSIS — M25551 Pain in right hip: Secondary | ICD-10-CM | POA: Diagnosis not present

## 2017-01-17 DIAGNOSIS — M545 Low back pain: Secondary | ICD-10-CM | POA: Diagnosis not present

## 2017-01-17 DIAGNOSIS — M25511 Pain in right shoulder: Secondary | ICD-10-CM | POA: Diagnosis not present

## 2017-01-17 DIAGNOSIS — S79911A Unspecified injury of right hip, initial encounter: Secondary | ICD-10-CM | POA: Diagnosis not present

## 2017-01-17 NOTE — Telephone Encounter (Signed)
Pt informed of normal xray results. Pt voiced understanding and was appreciative of the call.

## 2017-01-17 NOTE — Telephone Encounter (Signed)
-----   Message from Sela Hua, MD sent at 01/17/2017  2:56 PM EDT ----- Please let Ms. Vannatter know that her x-rays were completely normal. No broken bones. Thank you!

## 2017-01-20 ENCOUNTER — Ambulatory Visit (INDEPENDENT_AMBULATORY_CARE_PROVIDER_SITE_OTHER): Payer: Medicare Other | Admitting: Family Medicine

## 2017-01-20 ENCOUNTER — Encounter: Payer: Self-pay | Admitting: Family Medicine

## 2017-01-20 VITALS — BP 118/72 | HR 92 | Temp 97.5°F | Ht 63.0 in | Wt 175.2 lb

## 2017-01-20 DIAGNOSIS — E039 Hypothyroidism, unspecified: Secondary | ICD-10-CM

## 2017-01-20 DIAGNOSIS — M159 Polyosteoarthritis, unspecified: Secondary | ICD-10-CM | POA: Diagnosis not present

## 2017-01-20 DIAGNOSIS — J449 Chronic obstructive pulmonary disease, unspecified: Secondary | ICD-10-CM | POA: Diagnosis not present

## 2017-01-20 DIAGNOSIS — Z01419 Encounter for gynecological examination (general) (routine) without abnormal findings: Secondary | ICD-10-CM | POA: Diagnosis present

## 2017-01-20 MED ORDER — FREESTYLE SYSTEM KIT: 1.0000 | PACK | 0 refills | Status: DC | PRN

## 2017-01-20 MED ORDER — FLUTICASONE PROPIONATE HFA 110 MCG/ACT IN AERO: 1.0000 | INHALATION_SPRAY | Freq: Two times a day (BID) | RESPIRATORY_TRACT | 12 refills | Status: DC

## 2017-01-20 MED ORDER — LEVOTHYROXINE SODIUM 50 MCG PO TABS: 50.0000 ug | ORAL_TABLET | Freq: Every day | ORAL | 3 refills | Status: DC

## 2017-01-20 MED ORDER — ALBUTEROL SULFATE HFA 108 (90 BASE) MCG/ACT IN AERS: 2.0000 | INHALATION_SPRAY | RESPIRATORY_TRACT | 2 refills | Status: DC | PRN
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
## 2017-01-20 NOTE — Patient Instructions (Addendum)
[name] Long, it was very nice seeing you today.  I refilled some of your medication today (synthroid, flovent, albuterol) Please use the flovent every day (even if you feel like a million dollars) and use your albuterol as needed (when you are wheezing and out of breath).  Please follow up with your eye doctor.  I hope that you feel better.   It was very nice meeting you.  Daniel L. Rosalyn Gess, [place] Resident PGY-2 01/20/2017 3:44 PM

## 2017-01-22 DIAGNOSIS — J449 Chronic obstructive pulmonary disease, unspecified: Secondary | ICD-10-CM | POA: Insufficient documentation

## 2017-01-22 NOTE — Assessment & Plan Note (Signed)
Stable on levothyroxine 8mcg daily.  - continue current regimen

## 2017-01-22 NOTE — Assessment & Plan Note (Signed)
Stable. Updated medication list.   - refilled albuterol and flovent - educated patient on how to take these medications and return precautions

## 2017-01-23 ENCOUNTER — Emergency Department (HOSPITAL_COMMUNITY): Payer: Medicare Other

## 2017-01-23 ENCOUNTER — Encounter (HOSPITAL_COMMUNITY): Payer: Self-pay | Admitting: Emergency Medicine

## 2017-01-23 ENCOUNTER — Emergency Department (HOSPITAL_COMMUNITY)
Admission: EM | Admit: 2017-01-23 | Discharge: 2017-01-23 | Disposition: A | Discharge status: Home / Self Care | Payer: Medicare Other | Attending: Emergency Medicine | Admitting: Emergency Medicine

## 2017-01-23 DIAGNOSIS — Z5321 Procedure and treatment not carried out due to patient leaving prior to being seen by health care provider: Secondary | ICD-10-CM | POA: Insufficient documentation

## 2017-01-23 DIAGNOSIS — R0602 Shortness of breath: Secondary | ICD-10-CM | POA: Diagnosis not present

## 2017-01-23 DIAGNOSIS — J45909 Unspecified asthma, uncomplicated: Secondary | ICD-10-CM | POA: Diagnosis not present

## 2017-01-23 DIAGNOSIS — R05 Cough: Secondary | ICD-10-CM | POA: Diagnosis not present

## 2017-01-23 HISTORY — DX: Unspecified asthma, uncomplicated: J45.909

## 2017-01-23 MED ORDER — IPRATROPIUM-ALBUTEROL 0.5-2.5 (3) MG/3ML IN SOLN
3.0000 mL | Freq: Once | RESPIRATORY_TRACT | Status: AC
  Administered 2017-01-23: 3 mL via RESPIRATORY_TRACT
  Filled 2017-01-23: qty 3

## 2017-01-23 NOTE — ED Triage Notes (Signed)
Per pt, states here with a patient when she started having asthma symptoms-wheezing-has been unable to get inhaler from pharmacy

## 2017-01-26 ENCOUNTER — Other Ambulatory Visit: Payer: Self-pay | Admitting: Family Medicine

## 2017-01-26 NOTE — Telephone Encounter (Signed)
Pt states the meter was sent to the wrong pharmacy. Pt needs a glucose meter, test strips, and lancets sent to [facility] [address], but to make sure that medicare a and b will cover it. ep

## 2017-01-26 NOTE — Telephone Encounter (Signed)
Will forward to PCP.  Martin, Tamika L, RN  

## 2017-01-27 ENCOUNTER — Telehealth: Payer: Self-pay | Admitting: *Deleted

## 2017-01-27 MED ORDER — GLUCOSE BLOOD VI STRP: ORAL_STRIP | 11 refills | Status: DC

## 2017-01-27 MED ORDER — ACCU-CHEK AVIVA PLUS W/DEVICE KIT: 1.0000 | PACK | Freq: Four times a day (QID) | 0 refills | Status: DC

## 2017-01-27 MED ORDER — ACCU-CHEK SOFTCLIX LANCETS MISC: 12 refills | Status: DC

## 2017-01-27 NOTE — Telephone Encounter (Signed)
Patient states that it is time for her personal care services to be renewed with [place].  Patient states that she wants to keep her aid Ms. Torrie Mayers she is with [place]. [facility] [address] [place] [address].  Their fax# is [phone].  Will forward to MD to fill out the PCS form and fax it to [place].  Jazmin Hartsell,CMA

## 2017-01-30 NOTE — Telephone Encounter (Signed)
Patient is on metformin.  Called to let her know she doesn't need this equipment.   Thanks, Cherly Anderson. Rosalyn Gess, [place] Medicine Resident PGY-2 01/30/2017 7:43 AM

## 2017-01-30 NOTE — Telephone Encounter (Signed)
Will take care of this today 7/30 if papers are in my inbox.   Thanks, Cherly Anderson. Rosalyn Gess, [place] Medicine Resident PGY-2 01/30/2017 7:44 AM

## 2017-02-02 ENCOUNTER — Telehealth: Payer: Self-pay | Admitting: Family Medicine

## 2017-02-02 NOTE — Telephone Encounter (Signed)
Pt states she needs PCP to fax a form to [employer] to re certify for her with a nurse's aid. ep

## 2017-02-03 NOTE — Telephone Encounter (Signed)
Would be happy to, but there is no form in my box to sign.   Thanks, Cherly Anderson. Rosalyn Gess, [place] Resident PGY-2 02/03/2017 5:32 PM

## 2017-02-07 ENCOUNTER — Ambulatory Visit (INDEPENDENT_AMBULATORY_CARE_PROVIDER_SITE_OTHER): Payer: Medicare Other | Admitting: Orthopedic Surgery

## 2017-02-07 ENCOUNTER — Ambulatory Visit (INDEPENDENT_AMBULATORY_CARE_PROVIDER_SITE_OTHER): Payer: Medicare Other

## 2017-02-07 ENCOUNTER — Encounter (INDEPENDENT_AMBULATORY_CARE_PROVIDER_SITE_OTHER): Payer: Self-pay | Admitting: Orthopedic Surgery

## 2017-02-07 DIAGNOSIS — G8929 Other chronic pain: Secondary | ICD-10-CM | POA: Diagnosis not present

## 2017-02-07 DIAGNOSIS — M25511 Pain in right shoulder: Secondary | ICD-10-CM | POA: Diagnosis not present

## 2017-02-07 DIAGNOSIS — M25512 Pain in left shoulder: Secondary | ICD-10-CM

## 2017-02-07 MED ORDER — HYDROCODONE-ACETAMINOPHEN 5-325 MG PO TABS: 1.0000 | ORAL_TABLET | Freq: Two times a day (BID) | ORAL | 0 refills | Status: DC | PRN

## 2017-02-07 NOTE — Progress Notes (Signed)
Office Visit Note   Patient: [name] Long           Date of Birth: 1951-01-08           MRN: [id number] Visit Date: 02/07/2017              Requested by: Eloise Levels, MD [address] [place],  [address] PCP: Eloise Levels, MD  Chief Complaint  Patient presents with  . Right Shoulder - Pain      HPI: Patient is a 66 year old woman who complains of neck and bilateral shoulder pain radiating from the shoulders down to her legs and feet. Patient states that she slipped getting out of her chair on dog due in her home. Patient states that both of her shoulders are broken. Patient states that she slipped out of her chair on 01/13/2017.  Assessment & Plan: Visit Diagnoses:  1. Chronic right shoulder pain   2. Chronic left shoulder pain     Plan: Patient refused a nonsteroidal. She was given a refill for her Vicodin 10 tablets. Patient requests repeat hyaluronic acid injections for both knees we will request authorization for this.  Follow-Up Instructions: Return in about 4 weeks (around 03/07/2017).   Ortho Exam  Patient is alert, oriented, no adenopathy, well-dressed, normal affect, normal respiratory effort. Examination patient has abduction and flexion of both shoulders to 90. There were no focal weaknesses in either upper extremity or lower extremity. Patient has pain-free passive range of motion of both shoulders including abduction flexion internal and external rotation.  Imaging: Xr Shoulder Left  Result Date: 02/07/2017 2 view radiographs of the left shoulder show some mild arthritic changes of glenohumeral joint lung fields show no mass superior migration of the humeral head within the glenoid knee joint is congruent.(  Xr Shoulder Right  Result Date: 02/07/2017 Two-view radiographs the right shoulder shows no lung field masses there is some mild glenohumeral arthritis. Joint is congruent there is some superior migration of the head within the  glenoid.   Labs: Lab Results  Component Value Date   HGBA1C 7.7 11/10/2016   HGBA1C 6.3 (H) 06/10/2016   HGBA1C 6.3 10/19/2015   LABORGA NO GROWTH 12/04/2014    Orders:  Orders Placed This Encounter  Procedures  . XR Shoulder Right  . XR Shoulder Left   Meds ordered this encounter  Medications  . HYDROcodone-acetaminophen (NORCO/VICODIN) 5-325 MG tablet    Sig: Take 1 tablet by mouth 2 (two) times daily as needed for moderate pain.    Dispense:  10 tablet    Refill:  0     Procedures: No procedures performed  Clinical Data: No additional findings.  ROS:  All other systems negative, except as noted in the HPI. Review of Systems  Objective: Vital Signs: There were no vitals taken for this visit.  Specialty Comments:  No specialty comments available.  PMFS History: Patient Active Problem List   Diagnosis Date Noted  . COPD (chronic obstructive pulmonary disease) ([place]) 01/22/2017  . Shortness of breath 12/25/2016  . Pedal edema   . Acute on chronic congestive heart failure ([place])   . Irregular heart beat   . Chronic midline low back pain without sciatica 08/15/2016  . 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 Impingement syndrome of right shoulder 08/15/2016  . Schizoaffective disorder, bipolar type ([place])   . URI (upper respiratory infection) 03/09/2016  . Muscle strain of chest wall 10/21/2015  .  Activities of daily living deficit involving bathing of lower body 08/10/2015  . Hypokalemia 01/22/2015  . Vitamin D deficiency 12/19/2014  . Preventative health care 03/21/2014  . Bronchitis, chronic obstructive w acute bronchitis ([place]) 01/07/2014  . Osteoarthritis, multiple sites 06/08/2011  . Vaginal discharge 01/14/2011  . COPD exacerbation ([place]) 09/23/2010  . Fall 09/23/2010  . CHRONIC KIDNEY DISEASE STAGE II ([place]) 09/14/2009  . Hypothyroidism 08/31/2006  . Type 2 diabetes, diet controlled ([place])  08/31/2006  . HYPERCHOLESTEROLEMIA 08/31/2006  . Tobacco use disorder 08/31/2006  . HYPERTENSION, BENIGN SYSTEMIC 08/31/2006   Past Medical History:  Diagnosis Date  . Asthma   . BIPOLAR DISORDER 08/31/2006   Qualifier: Diagnosis of  By: Dorathy Daft MD, Marjory Lies    . CHRONIC KIDNEY DISEASE STAGE II ([facility]) 09/14/2009   Annotation: eGFR 64 Qualifier: Diagnosis of  By: Jess Barters MD, [name]    . COPD (chronic obstructive pulmonary disease) ([place]) 09/23/2010   Diagnosed at [facility] in 2008 (Dr. Annamaria Boots)   . DM (diabetes mellitus) type II controlled with renal manifestation ([place]) 08/31/2006   Qualifier: Diagnosis of  By: Dorathy Daft MD, Marjory Lies    . HYPERCHOLESTEROLEMIA 08/31/2006   Intolerance to Lipitor OK on Crestor but medicaid no longer covering    . HYPERTENSION, BENIGN SYSTEMIC 08/31/2006   Qualifier: Diagnosis of  By: Dorathy Daft MD, Marjory Lies    . HYPOTHYROIDISM, UNSPECIFIED 08/31/2006   Qualifier: Diagnosis of  By: Dorathy Daft MD, Alm Bustard History  Problem Relation Age of Onset  . Breast cancer Sister     Past Surgical History:  Procedure Laterality Date  . CESAREAN SECTION    . FOOT SURGERY    . KNEE SURGERY Bilateral    Social History   Occupational History  . Not on file.   Social History Main Topics  . Smoking status: Current Every Day Smoker    Packs/day: 0.50    Years: 39.00    Types: Cigarettes  . Smokeless tobacco: Never Used  . Alcohol use Yes  . Drug use: No  . Sexual activity: Not on file

## 2017-02-08 NOTE — Telephone Encounter (Signed)
Contacted pt and asked her if she had dropped off a form and she had not and there has not been a form sent from [employer]. Pt gave me the information for [facility] so we could get the re-certification form to be completed.  [facility] [address] [place], [place] [address]. Phone number is [phone]. Will try to contact them and see if they can fax it to [place]. Katharina Caper, April D, [place]

## 2017-02-09 NOTE — Telephone Encounter (Signed)
Spoke with [employer] and clarified exactly what pt was needing.  She said that she would send via fax the form attn. Dr. Rosalyn Gess for him to complete. Routing to PCP as an FYI so he can keep an eye out for this. Katharina Caper, April D, [place]

## 2017-02-10 ENCOUNTER — Other Ambulatory Visit (INDEPENDENT_AMBULATORY_CARE_PROVIDER_SITE_OTHER): Payer: Self-pay

## 2017-02-10 ENCOUNTER — Telehealth (INDEPENDENT_AMBULATORY_CARE_PROVIDER_SITE_OTHER): Payer: Self-pay

## 2017-02-10 NOTE — Telephone Encounter (Signed)
Pt to have injection bilateral knees for OA. Information submitted through [employer] portal and will hod this message pending approval.

## 2017-02-13 ENCOUNTER — Encounter: Payer: Self-pay | Admitting: Family Medicine

## 2017-02-13 ENCOUNTER — Ambulatory Visit (INDEPENDENT_AMBULATORY_CARE_PROVIDER_SITE_OTHER): Payer: Medicare Other | Admitting: Family Medicine

## 2017-02-13 VITALS — BP 140/90 | HR 100 | Temp 98.6°F | Ht 63.0 in | Wt 175.0 lb

## 2017-02-13 DIAGNOSIS — E119 Type 2 diabetes mellitus without complications: Secondary | ICD-10-CM | POA: Diagnosis present

## 2017-02-13 DIAGNOSIS — F250 Schizoaffective disorder, bipolar type: Secondary | ICD-10-CM | POA: Diagnosis not present

## 2017-02-13 LAB — POCT GLYCOSYLATED HEMOGLOBIN (HGB A1C): Hemoglobin A1C: 11.2

## 2017-02-13 MED ORDER — FLUCONAZOLE 100 MG PO TABS: 100.0000 mg | ORAL_TABLET | Freq: Once | ORAL | 0 refills | Status: AC

## 2017-02-13 MED ORDER — MICONAZOLE NITRATE 2 % VA CREA: 1.0000 | TOPICAL_CREAM | Freq: Every day | VAGINAL | 0 refills | Status: DC

## 2017-02-13 NOTE — Patient Instructions (Signed)
[name] Long, you were seen today for a check up.  Your hemoglobin A1C that tracks your diabetes as gone up.  At this point you need to be on insulin.  I want you to come back to the office as soon as you can with your daughter so we can have a discussion.   I also prescribed you some cream for your vaginal itching.  You can pick this up at the pharmacy.   It was very nice seeing you today.  Please come back with your daughter so we can chat.   Take care, Daniel L. Rosalyn Gess, [place] Resident PGY-2 02/13/2017 2:35 PM

## 2017-02-14 ENCOUNTER — Telehealth: Payer: Self-pay | Admitting: Family Medicine

## 2017-02-14 NOTE — Telephone Encounter (Signed)
After consulting with Autumn Patty to see what the allowable is from Medicare, we should not be buying and billing to patients who have Medicaid coverage, primary or secondary.  I have been told when Medicaid denies, we cannot bill patient for remaining balance. So Medicare will cover 80% and then we would write off, so we would loose money.  I am checking to see if we can have them sign ABN, but we still take a risk on not getting paid.  I will discuss in huddle tomorrow- I am telling everyone we have to send Medicaid patients to the respective patient assistance foundations to see if they can get approved for free injections.  So no do not buy and bill, hope this makes sense.  Oh and I believe this one is a regular red white and blue medicare, the card is just new.
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
## 2017-02-14 NOTE — Assessment & Plan Note (Signed)
Patient reports she has not taken metformin 500mg  in three weeks.  A1C up to 11.2 today up from 7.7 on 5/18 and previously diet controlled back on 12/17.  Situation is complicated by her guardian not being here today.  Will need to hold off on making any medication changes.   - encouraged patient to restart metformin - follow up in two weeks with daughter
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
## 2017-02-14 NOTE — Telephone Encounter (Signed)
I submit auth for [employer] on portal and it says that medicare benefits are to be verified by office. She has medicare and medicaid but not the red white and blue medicare so I am not sure how to proceed? Buy and bill ?
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
## 2017-02-15 NOTE — Telephone Encounter (Signed)
Not in the office today.  Will check box tomorrow for faxed paperwork. Was not there on 8/13. Blank paperwork provided by RN Ander Purpura was difficult to complete without any information provided from [employer].    Thanks, Dr. Rosalyn Gess

## 2017-02-15 NOTE — Progress Notes (Signed)
LCSW received return call from Elayne Guerin, [place] worker at [facility].    APS referral has been excepted for investigation.  Case assigned to Devoria Albe [phone].   Any new or additional information can be given to Ms. Anderson.    Casimer Lanius, LCSW Licensed Clinical Social Worker [facility]   [phone] 11:26 AM

## 2017-02-15 NOTE — Progress Notes (Signed)
In-basket message received from Dr. Rosalyn Gess with concerns for patient.  Stated patient is requesting assistance from [employer].  LCSW read notes provided by PCP and called [facility] APS [phone] spoke with Elayne Guerin to file APS  report.  LCSW only had 2nd had information and provided all information PCP listed in his note.  Updated provided to PCP in-basket message.    Casimer Lanius, LCSW Licensed Clinical Social Worker [place]   [phone] 10:09 AM

## 2017-02-16 NOTE — Telephone Encounter (Signed)
Thank you so much!  Linna Hoff

## 2017-02-17 ENCOUNTER — Telehealth: Payer: Self-pay | Admitting: Family Medicine

## 2017-02-17 NOTE — Telephone Encounter (Signed)
Pt is aware that we are not able to provide injections.
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
## 2017-02-20 ENCOUNTER — Telehealth: Payer: Self-pay | Admitting: Family Medicine

## 2017-02-20 ENCOUNTER — Ambulatory Visit (INDEPENDENT_AMBULATORY_CARE_PROVIDER_SITE_OTHER): Payer: Medicare Other | Admitting: Orthopaedic Surgery

## 2017-02-20 NOTE — Telephone Encounter (Signed)
Daughter would ike to talk to dr about her medicines.

## 2017-02-22 NOTE — Telephone Encounter (Signed)
Patient now calls, requesting a RX for a shower chair and nebulizer supplies to be sent to [facility] on [address]. She also requesting an RX for new glucometer and supplies. She states she wants this to go to [place]. I verified that she was referring to [facility] in [place]. She confirmed and stated that she has an "account" with them and they will help her get the glucometer. Please advise.

## 2017-02-23 ENCOUNTER — Telehealth: Payer: Self-pay | Admitting: Family Medicine

## 2017-02-23 DIAGNOSIS — I10 Essential (primary) hypertension: Secondary | ICD-10-CM | POA: Diagnosis not present

## 2017-02-23 DIAGNOSIS — R072 Precordial pain: Secondary | ICD-10-CM | POA: Diagnosis not present

## 2017-02-23 DIAGNOSIS — R0602 Shortness of breath: Secondary | ICD-10-CM | POA: Diagnosis not present

## 2017-02-23 DIAGNOSIS — J4520 Mild intermittent asthma, uncomplicated: Secondary | ICD-10-CM | POA: Diagnosis not present

## 2017-02-23 NOTE — Telephone Encounter (Signed)
Received call back from pts guardian Cassandra who confirmed that she would be present for pts appointment on 8/27 at 1:30PM. At that point we can discuss all concerns.   Daniel L. Rosalyn Gess, [name] Resident PGY-2 02/23/2017 1:44 PM

## 2017-02-23 NOTE — Telephone Encounter (Signed)
Called patient's daughter and was unable to reach her and had to leave a message.  She needs to come in with her daughter to be seen, especially because she needs to be started on insulin.  Will hold off on other other orders and [facility] referral until she is seen.   Thanks, Cherly Anderson. Rosalyn Gess, [place] [facility] Resident PGY-2 02/23/2017 8:52 AM

## 2017-02-23 NOTE — Telephone Encounter (Signed)
On second look, patient has appointment for 8/27 with me.  Please call either her or her daughter and make sure she is with her guardian for appointment.   Thanks, Cherly Anderson. Rosalyn Gess, [place] [facility] Resident PGY-2 02/23/2017 8:53 AM

## 2017-02-27 ENCOUNTER — Encounter: Payer: Self-pay | Admitting: Family Medicine

## 2017-02-27 ENCOUNTER — Ambulatory Visit (INDEPENDENT_AMBULATORY_CARE_PROVIDER_SITE_OTHER): Payer: Medicare Other | Admitting: Family Medicine

## 2017-02-27 VITALS — BP 90/64 | HR 74 | Temp 97.4°F | Ht 63.0 in | Wt 173.0 lb

## 2017-02-27 DIAGNOSIS — E119 Type 2 diabetes mellitus without complications: Secondary | ICD-10-CM

## 2017-02-27 DIAGNOSIS — F250 Schizoaffective disorder, bipolar type: Secondary | ICD-10-CM | POA: Diagnosis not present

## 2017-02-27 DIAGNOSIS — J441 Chronic obstructive pulmonary disease with (acute) exacerbation: Secondary | ICD-10-CM

## 2017-02-27 DIAGNOSIS — M549 Dorsalgia, unspecified: Secondary | ICD-10-CM | POA: Diagnosis present

## 2017-02-27 MED ORDER — ACCU-CHEK AVIVA PLUS W/DEVICE KIT: 1.0000 | PACK | Freq: Four times a day (QID) | 0 refills | Status: DC

## 2017-02-27 MED ORDER — IPRATROPIUM-ALBUTEROL 0.5-2.5 (3) MG/3ML IN SOLN: 3.0000 mL | Freq: Four times a day (QID) | RESPIRATORY_TRACT | Status: DC

## 2017-02-27 MED ORDER — FLUTICASONE PROPIONATE HFA 110 MCG/ACT IN AERO: 1.0000 | INHALATION_SPRAY | Freq: Two times a day (BID) | RESPIRATORY_TRACT | 12 refills | Status: DC

## 2017-02-27 MED ORDER — METFORMIN HCL 500 MG PO TABS: 1000.0000 mg | ORAL_TABLET | Freq: Every day | ORAL | 0 refills | Status: DC

## 2017-02-27 MED ORDER — FLUCONAZOLE 150 MG PO TABS: 150.0000 mg | ORAL_TABLET | Freq: Once | ORAL | 0 refills | Status: AC

## 2017-02-27 MED ORDER — ACCU-CHEK SOFTCLIX LANCETS MISC: 12 refills | Status: DC

## 2017-02-27 MED ORDER — HYDROCODONE-ACETAMINOPHEN 5-325 MG PO TABS: 1.0000 | ORAL_TABLET | Freq: Four times a day (QID) | ORAL | 0 refills | Status: DC | PRN

## 2017-02-27 MED ORDER — GLUCOSE BLOOD VI STRP: ORAL_STRIP | 11 refills | Status: DC

## 2017-02-27 NOTE — Telephone Encounter (Signed)
Pt has an appt with Dr Rosalyn Gess today. Will discuss form at that time. Ottis Stain, CMA

## 2017-02-27 NOTE — Telephone Encounter (Signed)
Pt had appt with Dr. Rosalyn Gess on 02/27/2017. Daughter was present at this appt. Ottis Stain, CMA

## 2017-02-27 NOTE — Patient Instructions (Signed)
[name] Long, you were seen today for diabetes follow up.  Your hemoglobin A1C has jumped over the past few months.  I am guessing this is due to you stopping your metformin in your increase in soda.   I am restarting your metformin and I want you to take 2 pills a day. Please do not drinking more sodas.   I have also sent in refills for your lancets, a new glucose meter, Flovent, and some Vicodin that he can take as needed for pain.   Please follow-up in 3 months and we can recheck your hemoglobin A1c.   Good to see you, [name] Long, [place] Resident PGY-2 02/27/2017 2:27 PM

## 2017-02-27 NOTE — Progress Notes (Signed)
    Subjective:  [name] Long is a 66 y.o. female who presents to the [facility] today for diabetes follow up  HPI:  DIABETES Type II Medications: Reports that she has not been taking her metformin for the past few months Blood Sugars per patient: Has not been taking sugars because she does not report working meter Diet-does not follow diabetic diet Regular Exercise-does not exercise  Health Maintenance Due  Topic Date Due  . COLONOSCOPY  07/24/2001  . URINE MICROALBUMIN  08/17/2012  . FOOT EXAM  05/30/2013  . OPHTHALMOLOGY EXAM  08/29/2015  . PNA vac Low Risk Adult (1 of 2 - PCV13) 07/24/2016  . LIPID PANEL  08/04/2016  . INFLUENZA VACCINE  02/01/2017   On Aspirin-81 mg daily On statin-pravastatin 40 mg daily Daily foot monitoring-no Last eye exam: Due for exam Last foot exam: Due for exam Nephropathy screen: Due for urine microalbumin  ROS- Denies Polyuria,Polydipsia, nocturia, Vision changes, feet or hand numbness/pain/tingling. Denies  Hypoglycemia symptoms (shaky, sweaty, hungry, weak anxious, tremor, palpitations, confusion, behavior change).   Hemoglobin a1c:  Lab Results  Component Value Date   HGBA1C 11.2 02/13/2017   HGBA1C 7.7 11/10/2016   HGBA1C 6.3 (H) 06/10/2016    Tobacco use reviewed Medication: reviewed and updated ROS: see HPI   Objective:  Physical Exam: BP 90/64   Pulse 74   Temp (!) 97.4 F (36.3 C) (Oral)   Ht 5\' 3"  (1.6 m)   Wt 173 lb (78.5 kg)   SpO2 94%   BMI 30.65 kg/m   Gen: 66 year old female in NAD, resting comfortably CV: RRR with no murmurs appreciated Pulm: NWOB, CTAB with no crackles, wheezes, or rhonchi GI: Normal bowel sounds present. Soft, Nontender, Nondistended. MSK: no edema, cyanosis, or clubbing noted Skin: warm, dry Neuro: grossly normal, moves all extremities Psych: Normal affect and thought content  No results found for this or any previous visit (from the past 72 hour(s)).   Assessment/Plan:  Type 2  diabetes mellitus ([place]) Poorly controlled with most recent hemoglobin A1c to 11.2 up from 7.7 from May 2018. Patient reports that she stopped taking her metformin because she was "told she was in remission". Denies any symptoms. Also has been drinking 2 L soda daily and not following diabetic diet. Given that there is a reasonable explanation for bump in A1c, I feel that restarting metformin at 500 mg twice a day and rechecking in 3 months would be appropriate instead of starting insulin.  - Follow-up in 3 months repeat A1c - Metformin 500 mg twice a day - Check urine microalbumin creatinine ratio - Retinopathy check  Schizoaffective disorder, bipolar type [facility]) Presented today with her daughter [name] who is her guardian and reports that patient is compliant with her medication. She make sure that she follows up regularly with [facility]. Discussed importance of having her present for all subsequent office visits and daughter understands. Denies any suicidal or homicidal ideation or intention.  - Continue current regimen

## 2017-02-28 ENCOUNTER — Telehealth: Payer: Self-pay | Admitting: Family Medicine

## 2017-02-28 NOTE — Telephone Encounter (Signed)
Pt says she will lose her aide if the form to [employer] isnt faxed back soon.  She also hasnt received the pill for the itching (diflucan).  Please advise

## 2017-02-28 NOTE — Telephone Encounter (Signed)
[facility] just faxed over a form for pt to get her blood sugar meter.  Medicia[employer] will not cover it until the dr sends a form to them authorizing it.

## 2017-03-01 DIAGNOSIS — E119 Type 2 diabetes mellitus without complications: Secondary | ICD-10-CM | POA: Diagnosis not present

## 2017-03-01 DIAGNOSIS — H524 Presbyopia: Secondary | ICD-10-CM | POA: Diagnosis not present

## 2017-03-01 LAB — HM DIABETES EYE EXAM

## 2017-03-02 ENCOUNTER — Other Ambulatory Visit: Payer: Self-pay | Admitting: Family Medicine

## 2017-03-02 ENCOUNTER — Telehealth: Payer: Self-pay | Admitting: *Deleted

## 2017-03-02 MED ORDER — ACCU-CHEK AVIVA PLUS W/DEVICE KIT: 1.0000 | PACK | Freq: Four times a day (QID) | 0 refills | Status: DC

## 2017-03-02 MED ORDER — ACCU-CHEK SOFTCLIX LANCETS MISC: 12 refills | Status: DC

## 2017-03-02 MED ORDER — GLUCOSE BLOOD VI STRP: ORAL_STRIP | 11 refills | Status: DC

## 2017-03-02 MED ORDER — FLUCONAZOLE 150 MG PO TABS: 150.0000 mg | ORAL_TABLET | Freq: Once | ORAL | 0 refills | Status: AC

## 2017-03-02 NOTE — Telephone Encounter (Signed)
[facility] called requesting most recent lab work. Lab draws from June 2018 was faxed.  Derl Barrow, RN

## 2017-03-02 NOTE — Assessment & Plan Note (Signed)
Presented today with her daughter [name] Long who is her guardian and reports that patient is compliant with her medication. She make sure that she follows up regularly with [facility]. Discussed importance of having her present for all subsequent office visits and daughter understands. Denies any suicidal or homicidal ideation or intention.  - Continue current regimen

## 2017-03-02 NOTE — Telephone Encounter (Signed)
Diabetic supplies resent under faculty provider due to PCP is not enrolled in [place] [employer]).  Advised patient that form was faxed to [employer] around August 17 th.  Derl Barrow, RN

## 2017-03-02 NOTE — Telephone Encounter (Signed)
There is no form in my box as of 8/30 @10 :10AM.   Daniel L. Rosalyn Gess, [place] Resident PGY-2 03/02/2017 10:10 AM

## 2017-03-02 NOTE — Assessment & Plan Note (Signed)
Poorly controlled with most recent hemoglobin A1c to 11.2 up from 7.7 from May 2018. Patient reports that she stopped taking her metformin because she was "told she was in remission". Denies any symptoms. Also has been drinking 2 L soda daily and not following diabetic diet. Given that there is a reasonable explanation for bump in A1c, I feel that restarting metformin at 500 mg twice a day and rechecking in 3 months would be appropriate instead of starting insulin.  - Follow-up in 3 months repeat A1c - Metformin 500 mg twice a day - Check urine microalbumin creatinine ratio - Retinopathy check

## 2017-03-02 NOTE — Progress Notes (Signed)
Order placed for diflucan. Will fill out glucometer paperwork when it is in my box. Nothing there as of 10:12AM on 8/30.   Daniel L. Rosalyn Gess, [place] Resident PGY-2 03/02/2017 10:12 AM

## 2017-03-03 MED ORDER — GLUCOSE BLOOD VI STRP: ORAL_STRIP | 12 refills | Status: DC

## 2017-03-03 MED ORDER — ONETOUCH VERIO W/DEVICE KIT: 1.0000 | PACK | Freq: Four times a day (QID) | 0 refills | Status: DC

## 2017-03-03 MED ORDER — ONETOUCH DELICA LANCETS FINE MISC: 1.0000 | Freq: Four times a day (QID) | 12 refills | Status: DC

## 2017-03-03 NOTE — Telephone Encounter (Signed)
Patient's daughter called stating the Accu-Chek products are not covered by patient's insurance. Will send in [employer] products. Derl Barrow, RN

## 2017-03-07 ENCOUNTER — Telehealth: Payer: Self-pay | Admitting: Licensed Clinical Social Worker

## 2017-03-07 ENCOUNTER — Telehealth: Payer: Self-pay | Admitting: Family Medicine

## 2017-03-07 ENCOUNTER — Ambulatory Visit (INDEPENDENT_AMBULATORY_CARE_PROVIDER_SITE_OTHER): Payer: Medicare Other | Admitting: Orthopaedic Surgery

## 2017-03-07 ENCOUNTER — Ambulatory Visit (INDEPENDENT_AMBULATORY_CARE_PROVIDER_SITE_OTHER): Payer: Medicare Other | Admitting: Orthopedic Surgery

## 2017-03-07 NOTE — Progress Notes (Signed)
LCSW received call from [place] worker.  She needs to very that patient kept her scheduled medical appointment.  APS worker indicated she submitted 2nd request for patient's medical records today due to not receiving them.  LCSW verified request received and mailed to [place] Aug. 21st.   Medical records received the 2nd request today and will send another copy.  LCSW called APS worker Ms. Anderson to provide an update.  APS worker indicated she has assessed all functional areas of medical and self-neglect.  She anticipates that the case will be closed soon, all concerns have need addressed. Updated provided to PCP.  Casimer Lanius, LCSW Licensed Clinical Social Worker [place]   [phone] 10:35 AM

## 2017-03-07 NOTE — Telephone Encounter (Signed)
Pt would like to have a referral to be seen at [facility]. Since she can't get her gel shots at [facility]. jw

## 2017-03-08 ENCOUNTER — Emergency Department (HOSPITAL_COMMUNITY): Payer: Medicare Other

## 2017-03-08 ENCOUNTER — Emergency Department (HOSPITAL_COMMUNITY)
Admission: EM | Admit: 2017-03-08 | Discharge: 2017-03-09 | Disposition: A | Discharge status: Home / Self Care | Payer: Medicare Other | Attending: Emergency Medicine | Admitting: Emergency Medicine

## 2017-03-08 ENCOUNTER — Encounter (HOSPITAL_COMMUNITY): Payer: Self-pay | Admitting: Emergency Medicine

## 2017-03-08 DIAGNOSIS — M25511 Pain in right shoulder: Secondary | ICD-10-CM | POA: Diagnosis not present

## 2017-03-08 DIAGNOSIS — M25512 Pain in left shoulder: Secondary | ICD-10-CM | POA: Insufficient documentation

## 2017-03-08 DIAGNOSIS — Z5321 Procedure and treatment not carried out due to patient leaving prior to being seen by health care provider: Secondary | ICD-10-CM | POA: Diagnosis not present

## 2017-03-08 DIAGNOSIS — F209 Schizophrenia, unspecified: Secondary | ICD-10-CM | POA: Diagnosis not present

## 2017-03-08 NOTE — ED Triage Notes (Signed)
Pt c/o 10/10 bilateral shoulder pain, crying on triage, pt states no medications helps her, she is having hard time to sleep and she can get orthopedic MD that takes her insurance.

## 2017-03-09 ENCOUNTER — Telehealth: Payer: Self-pay | Admitting: Family Medicine

## 2017-03-09 NOTE — ED Notes (Signed)
Patient called for room x 2, no answer.  

## 2017-03-09 NOTE — Telephone Encounter (Signed)
Thank you for the update, Neoma Laming.  Linna Hoff

## 2017-03-09 NOTE — Telephone Encounter (Signed)
Pt is calling for two reason's. One is she is checking the status of her request to have a referral to [facility]. The second is about the forms we are filling out and faxing to [facility] so that she can get her aide. Please call her to discuss these issues. jw

## 2017-03-09 NOTE — Telephone Encounter (Signed)
Form placed in your box with instructions.  The portions you must complete have been highlighted.  Once complete please place in [place] box so she can check it and then she will take care of faxing it. Katharina Caper, April D, [place]

## 2017-03-09 NOTE — ED Notes (Signed)
Patient called for room x 3, no answer. Moved OTF.

## 2017-03-09 NOTE — Telephone Encounter (Signed)
I spoke with Ander Purpura about this form and she is going to complete what she can on the form then will place in your box for you to complete so we can get this done for pt. Will route message to you once placed in your box Katharina Caper, April D, [place]

## 2017-03-14 ENCOUNTER — Other Ambulatory Visit: Payer: Self-pay | Admitting: Family Medicine

## 2017-03-15 NOTE — Telephone Encounter (Signed)
Form completed and signed by PCP faxed to [place] at [phone]. Sonia Baller at [employer] notified that this has been done. Patient also notified. Hubbard Hartshorn, RN, BSN

## 2017-03-21 DIAGNOSIS — J4520 Mild intermittent asthma, uncomplicated: Secondary | ICD-10-CM | POA: Diagnosis not present

## 2017-03-21 DIAGNOSIS — R072 Precordial pain: Secondary | ICD-10-CM | POA: Diagnosis not present

## 2017-03-21 DIAGNOSIS — R0602 Shortness of breath: Secondary | ICD-10-CM | POA: Diagnosis not present

## 2017-03-21 DIAGNOSIS — I10 Essential (primary) hypertension: Secondary | ICD-10-CM | POA: Diagnosis not present

## 2017-03-23 NOTE — Telephone Encounter (Signed)
Referral sent to [facility] today by fax.

## 2017-03-24 ENCOUNTER — Telehealth: Payer: Self-pay | Admitting: *Deleted

## 2017-03-24 NOTE — Telephone Encounter (Signed)
Referral was processed and faxed to [facility]. She will be contacted by their office soon. Attempted to call patient, but phone just rang.

## 2017-03-24 NOTE — Telephone Encounter (Signed)
Patient advised that referral was placed yesterday and she should be getting a call from them very soon.  Derl Barrow, RN

## 2017-03-24 NOTE — Telephone Encounter (Signed)
Patient called for referral to [facility] for knee and should injections. She also need evaluation of her neck.  Please give her a call; number on file is correct.  Derl Barrow, RN

## 2017-03-27 ENCOUNTER — Telehealth (INDEPENDENT_AMBULATORY_CARE_PROVIDER_SITE_OTHER): Payer: Self-pay | Admitting: Orthopedic Surgery

## 2017-03-27 NOTE — Telephone Encounter (Signed)
Pt states she was told by our office at her last visit that her insurance(Medicare & Medicaid) would not cover the gel injection ( did not know name) that she had been getting since 2008 from another doctor. Pt request a call back to discuss further

## 2017-03-29 NOTE — Telephone Encounter (Signed)
Advised patient over the phone again that medicaid does not cover for gel injections at all. Medicare will not let us bill, they will only pay 80% and patient would have to be responsible for 20%, usually has to be written off and we are not allowed to bill per Abigail Butts. Advised her I would see if she qualifies for financial assistance. She only brings home 800 dollars a month, and half of this goes to her rent. She will need to sign application and we will fax this for her. She has an appointment this Thursday she can sign at that time, she will have injections for her shoulder that day.

## 2017-03-30 ENCOUNTER — Ambulatory Visit (INDEPENDENT_AMBULATORY_CARE_PROVIDER_SITE_OTHER): Payer: Medicare Other | Admitting: Family

## 2017-03-30 DIAGNOSIS — M7542 Impingement syndrome of left shoulder: Secondary | ICD-10-CM

## 2017-03-30 MED ORDER — METHYLPREDNISOLONE ACETATE 40 MG/ML IJ SUSP
40.0000 mg | INTRAMUSCULAR | Status: AC | PRN
Start: 2017-03-30 — End: 2017-03-30
  Administered 2017-03-30: 40 mg via INTRA_ARTICULAR

## 2017-03-30 MED ORDER — LIDOCAINE HCL 1 % IJ SOLN
5.0000 mL | INTRAMUSCULAR | Status: AC | PRN
  Administered 2017-03-30: 5 mL

## 2017-03-30 NOTE — Progress Notes (Signed)
Office Visit Note   Patient: [name] Long           Date of Birth: April 09, 1951           MRN: [id number] Visit Date: 03/30/2017              Requested by: Eloise Levels, MD [place], [place] [address] PCP: Eloise Levels, MD  No chief complaint on file.     HPI: The patient is a 66 year old woman who presents a complaining of chronic left shoulder pain. Stiffness. pain with above head reaching having a hard time lifting. States she must care for 15 grandchildren and is having pain in her shoulder and arm with lifting. Requesting a Depo-Medrol injection. Has had these in the past and they have worked well.  No recent injury.  Assessment & Plan: Visit Diagnoses:  1. Impingement syndrome of left shoulder     Plan: Depomedrol injection today. Follow up in office as needed. Declined refill of vicodin today. Will use NSAIDs.  Follow-Up Instructions: Return in about 4 weeks (around 04/27/2017), or if symptoms worsen or fail to improve.   Left Shoulder Exam   Tenderness  The patient is experiencing tenderness in the biceps tendon.  Range of Motion  The patient has normal left shoulder ROM.  Muscle Strength  The patient has normal left shoulder strength.  Tests  Drop Arm: negative Impingement: positive  Other  Erythema: absent Pulse: present       Patient is alert, oriented, no adenopathy, well-dressed, normal affect, normal respiratory effort.   Imaging: No results found. No images are attached to the encounter.  Labs: Lab Results  Component Value Date   HGBA1C 11.2 02/13/2017   HGBA1C 7.7 11/10/2016   HGBA1C 6.3 (H) 06/10/2016   LABORGA NO GROWTH 12/04/2014    Orders:  No orders of the defined types were placed in this encounter.  No orders of the defined types were placed in this encounter.    Procedures: Large Joint Inj Date/Time: 03/30/2017 3:19 PM Performed by: Suzan Slick Authorized by: Dondra Prader R   Consent  Given by:  Patient Site marked: the procedure site was marked   Timeout: prior to procedure the correct patient, procedure, and site was verified   Indications:  Pain and diagnostic evaluation Location:  Shoulder Site:  L subacromial bursa Prep: patient was prepped and draped in usual sterile fashion   Needle Size:  22 G Needle Length:  1.5 inches Ultrasound Guidance: No   Fluoroscopic Guidance: No   Arthrogram: No   Medications:  5 mL lidocaine 1 %; 40 mg methylPREDNISolone acetate 40 MG/ML Aspiration Attempted: No   Patient tolerance:  Patient tolerated the procedure well with no immediate complications    Clinical Data: No additional findings.  ROS:  All other systems negative, except as noted in the HPI. Review of Systems  Constitutional: Negative for chills and fever.  Musculoskeletal: Positive for arthralgias and myalgias.  Neurological: Negative for weakness and numbness.    Objective: Vital Signs: There were no vitals taken for this visit.  Specialty Comments:  No specialty comments available.  PMFS History: Patient Active Problem List   Diagnosis Date Noted  . COPD (chronic obstructive pulmonary disease) ([place]) 01/22/2017  . Shortness of breath 12/25/2016  . Pedal edema   . Acute on chronic congestive heart failure ([place])   . Irregular heart beat   . Chronic midline low back pain without sciatica 08/15/2016  .  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 Impingement syndrome of right shoulder 08/15/2016  . Schizoaffective disorder, bipolar type ([place])   . URI (upper respiratory infection) 03/09/2016  . Muscle strain of chest wall 10/21/2015  . Activities of daily living deficit involving bathing of lower body 08/10/2015  . Hypokalemia 01/22/2015  . Vitamin D deficiency 12/19/2014  . Preventative health care 03/21/2014  . Bronchitis, chronic obstructive w acute bronchitis ([place]) 01/07/2014  . Osteoarthritis,  multiple sites 06/08/2011  . Vaginal discharge 01/14/2011  . COPD exacerbation ([place]) 09/23/2010  . Fall 09/23/2010  . CHRONIC KIDNEY DISEASE STAGE II ([facility]) 09/14/2009  . Hypothyroidism 08/31/2006  . Type 2 diabetes mellitus ([place]) 08/31/2006  . HYPERCHOLESTEROLEMIA 08/31/2006  . Tobacco use disorder 08/31/2006  . HYPERTENSION, BENIGN SYSTEMIC 08/31/2006   Past Medical History:  Diagnosis Date  . Asthma   . BIPOLAR DISORDER 08/31/2006   Qualifier: Diagnosis of  By: Dorathy Daft MD, Marjory Lies    . CHRONIC KIDNEY DISEASE STAGE II (MILD) 09/14/2009   Annotation: eGFR 64 Qualifier: Diagnosis of  By: Jess Barters MD, [name]    . COPD (chronic obstructive pulmonary disease) ([place]) 09/23/2010   Diagnosed at [facility] in 2008 (Dr. Annamaria Boots)   . DM (diabetes mellitus) type II controlled with renal manifestation ([place]) 08/31/2006   Qualifier: Diagnosis of  By: Dorathy Daft MD, Marjory Lies    . HYPERCHOLESTEROLEMIA 08/31/2006   Intolerance to Lipitor OK on Crestor but medicaid no longer covering    . HYPERTENSION, BENIGN SYSTEMIC 08/31/2006   Qualifier: Diagnosis of  By: Dorathy Daft MD, Marjory Lies    . HYPOTHYROIDISM, UNSPECIFIED 08/31/2006   Qualifier: Diagnosis of  By: Dorathy Daft MD, Alm Bustard History  Problem Relation Age of Onset  . Breast cancer Sister     Past Surgical History:  Procedure Laterality Date  . CESAREAN SECTION    . FOOT SURGERY    . KNEE SURGERY Bilateral    Social History   Occupational History  . Not on file.   Social History Main Topics  . Smoking status: Current Every Day Smoker    Packs/day: 0.50    Years: 39.00    Types: Cigarettes  . Smokeless tobacco: Never Used  . Alcohol use Yes  . Drug use: No  . Sexual activity: Not on file

## 2017-04-07 ENCOUNTER — Other Ambulatory Visit: Payer: Self-pay | Admitting: Family Medicine

## 2017-04-07 NOTE — Telephone Encounter (Signed)
Pt called because she would like to have a prescription for 10 Hydrocodone to be left up front. She was also talking about a BP medication that s begins with Meth--- I could not find this medication. She said that the phar,acy keeps sending [place] a request for a refill. Please let patient know what the outcome is. Blima Rich

## 2017-04-08 ENCOUNTER — Other Ambulatory Visit: Payer: Self-pay | Admitting: Family Medicine

## 2017-04-10 NOTE — Progress Notes (Signed)
OT Evaluation - G-code late Entry    12/26/16 1400  OT Time Calculation  OT Start Time (ACUTE ONLY) 1351  OT Stop Time (ACUTE ONLY) 1413  OT Time Calculation (min) 22 min  OT G-codes **NOT FOR INPATIENT CLASS**  Functional Assessment Tool Used Clinical judgement  Functional Limitation Self care  Self Care Current Status (U1222) CJ  Self Care Goal Status (I1146) CI   [name] Long MSOT, OTR/L Acute Rehab Pager: [phone] Office: [phone]

## 2017-04-10 NOTE — Progress Notes (Signed)
Addition of G-codes to chart    01/18/17 1500  PT G-Codes **NOT FOR INPATIENT CLASS**  Functional Assessment Tool Used AM-PAC 6 Clicks Basic Mobility  Functional Limitation Mobility: Walking and moving around  Mobility: Walking and Moving Around Current Status (A1937) CJ  Mobility: Walking and Moving Around Goal Status (T0240) CI   Benjamine Mola B. Migdalia Dk PT, DPT Acute Rehabilitation  [phone] Pager [phone]

## 2017-04-11 ENCOUNTER — Encounter: Payer: Self-pay | Admitting: Family Medicine

## 2017-04-12 DIAGNOSIS — F209 Schizophrenia, unspecified: Secondary | ICD-10-CM | POA: Diagnosis not present

## 2017-04-14 ENCOUNTER — Encounter (INDEPENDENT_AMBULATORY_CARE_PROVIDER_SITE_OTHER): Payer: Self-pay | Admitting: Family

## 2017-04-14 ENCOUNTER — Ambulatory Visit (INDEPENDENT_AMBULATORY_CARE_PROVIDER_SITE_OTHER): Payer: Medicare Other | Admitting: Family

## 2017-04-14 DIAGNOSIS — M7541 Impingement syndrome of right shoulder: Secondary | ICD-10-CM

## 2017-04-14 MED ORDER — NAPROXEN 500 MG PO TABS: 500.0000 mg | ORAL_TABLET | Freq: Two times a day (BID) | ORAL | 0 refills | Status: DC

## 2017-04-14 MED ORDER — LIDOCAINE HCL 1 % IJ SOLN
5.0000 mL | INTRAMUSCULAR | Status: AC | PRN
  Administered 2017-04-14: 5 mL

## 2017-04-14 MED ORDER — METHYLPREDNISOLONE ACETATE 40 MG/ML IJ SUSP
40.0000 mg | INTRAMUSCULAR | Status: AC | PRN
  Administered 2017-04-14: 40 mg via INTRA_ARTICULAR

## 2017-04-14 MED ORDER — HYDROCODONE-ACETAMINOPHEN 5-325 MG PO TABS: 1.0000 | ORAL_TABLET | Freq: Four times a day (QID) | ORAL | 0 refills | Status: DC | PRN

## 2017-04-14 NOTE — Telephone Encounter (Signed)
Unable to contact PCP  Will refill once and patient must have visit with PCP before more refills   Was seen on 8/27 and Rx written for 10 hydrcodone tabs but I could not find reason  listed in note  Rx printed and put up front  Pls notify patient.    Do not know which medication she is referring to.  Needs to have pharmacy send an electonic refill request

## 2017-04-14 NOTE — Progress Notes (Signed)
Office Visit Note   Patient: [name] Long           Date of Birth: Nov 13, 1950           MRN: [id number] Visit Date: 04/14/2017              Requested by: Eloise Levels, MD [address] [place], [place] [address] PCP: Eloise Levels, MD  Chief Complaint  Patient presents with  . Right Shoulder - Pain      HPI: The patient is a 66 year old woman who presents a complaining of chronic right shoulder pain. Stiffness. pain with above head reaching having a hard time lifting. States she must care for 15 grandchildren and is having pain in her shoulder and arm with lifting. Requesting a Depo-Medrol injection. Has had these in the past and they have worked well.  No recent injury. Concerned about worsening of her pain with the coming of winter and cooler temperatures.   Assessment & Plan: Visit Diagnoses:  No diagnosis found.  Plan: Depomedrol injection today. Follow up in office as needed. Declined refill of vicodin today appears to have an order from another provider today. Will use NSAIDs as needed.   Follow-Up Instructions: Return if symptoms worsen or fail to improve.   Right Shoulder Exam   Range of Motion  Active Abduction: 100  Extension: normal   Muscle Strength  The patient has normal right shoulder strength.  Tests  Impingement: positive  Other  Erythema: absent Sensation: normal Pulse: present      Patient is alert, oriented, no adenopathy, well-dressed, normal affect, normal respiratory effort.   Imaging: No results found. No images are attached to the encounter.  Labs: Lab Results  Component Value Date   HGBA1C 11.2 02/13/2017   HGBA1C 7.7 11/10/2016   HGBA1C 6.3 (H) 06/10/2016   LABORGA NO GROWTH 12/04/2014    Orders:  No orders of the defined types were placed in this encounter.  Meds ordered this encounter  Medications  . naproxen (NAPROSYN) 500 MG tablet    Sig: Take 1 tablet (500 mg total) by mouth 2 (two) times daily with  a meal.    Dispense:  60 tablet    Refill:  0     Procedures: Large Joint Inj Date/Time: 04/14/2017 1:55 PM Performed by: Suzan Slick Authorized by: Dondra Prader R   Consent Given by:  Patient Site marked: the procedure site was marked   Timeout: prior to procedure the correct patient, procedure, and site was verified   Indications:  Pain and diagnostic evaluation Location:  Shoulder Site:  R subacromial bursa Prep: patient was prepped and draped in usual sterile fashion   Needle Size:  22 G Needle Length:  1.5 inches Ultrasound Guidance: No   Fluoroscopic Guidance: No   Arthrogram: No   Medications:  5 mL lidocaine 1 %; 40 mg methylPREDNISolone acetate 40 MG/ML Aspiration Attempted: No   Patient tolerance:  Patient tolerated the procedure well with no immediate complications    Clinical Data: No additional findings.  ROS:  All other systems negative, except as noted in the HPI. Review of Systems  Constitutional: Negative for chills and fever.  Musculoskeletal: Positive for arthralgias and myalgias.  Neurological: Negative for weakness and numbness.    Objective: Vital Signs: There were no vitals taken for this visit.  Specialty Comments:  No specialty comments available.  PMFS History: Patient Active Problem List   Diagnosis Date Noted  . COPD (chronic  obstructive pulmonary disease) ([place]) 01/22/2017  . Shortness of breath 12/25/2016  . Pedal edema   . Acute on chronic congestive heart failure ([place])   . Irregular heart beat   . Chronic midline low back pain without sciatica 08/15/2016  . 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 Impingement syndrome of right shoulder 08/15/2016  . Schizoaffective disorder, bipolar type ([place])   . URI (upper respiratory infection) 03/09/2016  . Muscle strain of chest wall 10/21/2015  . Activities of daily living deficit involving bathing of lower body 08/10/2015  .  Hypokalemia 01/22/2015  . Vitamin D deficiency 12/19/2014  . Preventative health care 03/21/2014  . Bronchitis, chronic obstructive w acute bronchitis ([place]) 01/07/2014  . Osteoarthritis, multiple sites 06/08/2011  . Vaginal discharge 01/14/2011  . COPD exacerbation ([place]) 09/23/2010  . Fall 09/23/2010  . CHRONIC KIDNEY DISEASE STAGE II ([place]) 09/14/2009  . Hypothyroidism 08/31/2006  . Type 2 diabetes mellitus ([place]) 08/31/2006  . HYPERCHOLESTEROLEMIA 08/31/2006  . Tobacco use disorder 08/31/2006  . HYPERTENSION, BENIGN SYSTEMIC 08/31/2006   Past Medical History:  Diagnosis Date  . Asthma   . BIPOLAR DISORDER 08/31/2006   Qualifier: Diagnosis of  By: Dorathy Daft MD, Marjory Lies    . CHRONIC KIDNEY DISEASE STAGE II ([place]) 09/14/2009   Annotation: eGFR 64 Qualifier: Diagnosis of  By: Jess Barters MD, [name]    . COPD (chronic obstructive pulmonary disease) ([place]) 09/23/2010   Diagnosed at [facility] in 2008 (Dr. Annamaria Boots)   . DM (diabetes mellitus) type II controlled with renal manifestation ([place]) 08/31/2006   Qualifier: Diagnosis of  By: Dorathy Daft MD, Marjory Lies    . HYPERCHOLESTEROLEMIA 08/31/2006   Intolerance to Lipitor OK on Crestor but medicaid no longer covering    . HYPERTENSION, BENIGN SYSTEMIC 08/31/2006   Qualifier: Diagnosis of  By: Dorathy Daft MD, Marjory Lies    . HYPOTHYROIDISM, UNSPECIFIED 08/31/2006   Qualifier: Diagnosis of  By: Dorathy Daft MD, Alm Bustard History  Problem Relation Age of Onset  . Breast cancer Sister     Past Surgical History:  Procedure Laterality Date  . CESAREAN SECTION    . FOOT SURGERY    . KNEE SURGERY Bilateral    Social History   Occupational History  . Not on file.   Social History Main Topics  . Smoking status: Current Every Day Smoker    Packs/day: 0.50    Years: 39.00    Types: Cigarettes  . Smokeless tobacco: Never Used  . Alcohol use Yes  . Drug use: No  . Sexual activity: Not on file

## 2017-04-14 NOTE — Telephone Encounter (Signed)
Pt informed of below. Zimmerman Rumple, April D, CMA  

## 2017-04-18 ENCOUNTER — Encounter: Payer: Self-pay | Admitting: Family Medicine

## 2017-04-18 ENCOUNTER — Telehealth: Payer: Self-pay | Admitting: Licensed Clinical Social Worker
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
## 2017-04-25 ENCOUNTER — Other Ambulatory Visit: Payer: Self-pay | Admitting: Family Medicine

## 2017-04-26 ENCOUNTER — Encounter: Payer: Self-pay | Admitting: Family Medicine

## 2017-04-26 ENCOUNTER — Ambulatory Visit (INDEPENDENT_AMBULATORY_CARE_PROVIDER_SITE_OTHER): Payer: Medicare Other | Admitting: Family Medicine

## 2017-04-26 VITALS — BP 120/70 | HR 84 | Temp 98.7°F | Ht 63.0 in | Wt 169.0 lb

## 2017-04-26 DIAGNOSIS — M545 Low back pain, unspecified: Secondary | ICD-10-CM

## 2017-04-26 DIAGNOSIS — E119 Type 2 diabetes mellitus without complications: Secondary | ICD-10-CM | POA: Diagnosis not present

## 2017-04-26 DIAGNOSIS — G8929 Other chronic pain: Secondary | ICD-10-CM | POA: Diagnosis not present

## 2017-04-26 DIAGNOSIS — Z23 Encounter for immunization: Secondary | ICD-10-CM

## 2017-04-26 DIAGNOSIS — E785 Hyperlipidemia, unspecified: Secondary | ICD-10-CM

## 2017-04-26 MED ORDER — DICLOFENAC SODIUM 1 % TD GEL: 2.0000 g | Freq: Four times a day (QID) | TRANSDERMAL | 0 refills | Status: DC

## 2017-04-26 MED ORDER — FLUTICASONE PROPIONATE 50 MCG/ACT NA SUSP: 2.0000 | Freq: Every day | NASAL | 1 refills | Status: DC

## 2017-04-26 NOTE — Patient Instructions (Signed)
[place], you were seen today for a check up.  I prescribed you some diclofenac gel to put on your back where you are having pain.  You can also take your naproxen as needed.    I also prescribed you the flonase nasal spray to help with your ear pain. Take 2 sprays in each nose two times daily for a month.   I am also checking your cholesterol and your urine for protein.  I will let you know what these results are.   Please follow up in one month for a recheck of your sugars.   Take care!  Daniel L. Rosalyn Gess, [place] Medicine Resident PGY-2 04/26/2017 3:50 PM

## 2017-04-26 NOTE — Progress Notes (Signed)
Subjective:  [name] Long is a 66 y.o. female who presents to the [facility] today for a check up and to discuss chronic back pain  HPI:  lower back pain, chronic No known injuries. Has been having back pain since 2017.  Pain is in the entire lower back but worse in the left side.  Pain is made worse with bending forward and twisting.  No lower extremity weakness, numbness or tingling.  She has no changes in bowel or bladder habits. No fevers or chill, history of cancer or steroid use.   Type 2 diabetes Takes 500 mg metformin twice daily.  Last A1c was 11.2.  She attributes this to not taking her medication.  She denies any polyuria polydipsia or polyphagia.  Reports that she has been taking her medication daily without missing a single dose.  Not currently due for recheck of A1c.    PMH: Type 2 diabetes, hypertension, COPD, tobacco abuse, schizoaffective bipolar type Tobacco use: Current smoker Medication: reviewed and updated ROS: see HPI   Objective:  Physical Exam: BP 120/70   Pulse 84   Temp 98.7 F (37.1 C) (Oral)   Ht 5\' 3"  (1.6 m)   Wt 169 lb (76.7 kg)   SpO2 98%   BMI 29.94 kg/m   Gen: 66 year old female in NAD, resting comfortably CV: RRR with no murmurs appreciated Pulm: NWOB, CTAB with no crackles, wheezes, or rhonchi GI: Normal bowel sounds present. Soft, Nontender, Nondistended. MSK: no edema, cyanosis, or clubbing noted.  Does have some minimal midline tenderness in the lumbar spine as well as bilateral paraspinal muscle tenderness worse on the left and the right.  There is increased pain with trunk flexion.  Skin: warm, dry Neuro: grossly normal, moves all extremities Psych: Normal affect and thought content  No results found for this or any previous visit (from the past 72 hour(s)).  Diabetic Foot Exam - Simple   Simple Foot Form Diabetic Foot exam was performed with the following findings:  Yes 04/26/2017  4:48 PM  Visual Inspection No deformities, no  ulcerations, no other skin breakdown bilaterally:  Yes Sensation Testing Intact to touch and monofilament testing bilaterally:  Yes Pulse Check Posterior Tibialis and Dorsalis pulse intact bilaterally:  Yes Comments      Assessment/Plan:  Chronic lower back pain without sciatica Patient does have chronic lower back pain with some minimal spinal tenderness in the lumbar spine as well as bilateral paraspinal muscle tenderness worse in the left than the right.  She has been taking naproxen daily and unfortunately does have kidney disease.  I discussed my concern with her taking this medication on a daily basis and how it may worsen her kidney function and she understands.  I recommended that she try diclofenac gel as an alternative to her taking these NSAIDs.  She reported that she has sensitive skin and is worried that this well cause irritation.  I did still prescribe diclofenac gel and recommended that she try.  Type 2 diabetes Most recent A1c was 11.2 and she is not currently due for A1c check.  She denies any current symptoms. she reports that she is taking her Metformin 500 mg twice daily on a daily basis and has not missed any doses.  We will recheck her A1c in 1 month and hopefully it has come down to a reasonable number otherwise we may have to initiate some insulin.   Healthcare maintenance Received foot exam today as well as pneumonia shot, flu  shot, urine microalbumin and lipid panel.  I previously put in a colonoscopy referral for her and have encouraged her to please try to follow-up and schedule that.  I will be seeing her next month for hemoglobin A1c recheck and I will follow-up on where she has with getting a colonoscopy done.   Daniel L. Rosalyn Gess, [place] Medicine Resident PGY-2 04/27/2017 2:03 PM

## 2017-04-27 LAB — MICROALBUMIN / CREATININE URINE RATIO
Creatinine, Urine: 108.3 mg/dL
Microalb/Creat Ratio: 3 mg/g creat (ref 0.0–30.0)
Microalbumin, Urine: 3.2 ug/mL

## 2017-04-27 LAB — LIPID PANEL
Chol/HDL Ratio: 4.1 ratio (ref 0.0–4.4)
Cholesterol, Total: 211 mg/dL — ABNORMAL HIGH (ref 100–199)
HDL: 51 mg/dL
LDL Calculated: 131 mg/dL — ABNORMAL HIGH (ref 0–99)
Triglycerides: 143 mg/dL (ref 0–149)
VLDL Cholesterol Cal: 29 mg/dL (ref 5–40)

## 2017-04-28 ENCOUNTER — Other Ambulatory Visit: Payer: Self-pay | Admitting: Family Medicine

## 2017-04-28 ENCOUNTER — Encounter: Payer: Self-pay | Admitting: Family Medicine

## 2017-05-17 ENCOUNTER — Other Ambulatory Visit: Payer: Self-pay | Admitting: Family Medicine

## 2017-05-17 NOTE — Telephone Encounter (Signed)
Pt called and would like a refill on her metformin. Please advise

## 2017-05-18 ENCOUNTER — Ambulatory Visit (INDEPENDENT_AMBULATORY_CARE_PROVIDER_SITE_OTHER): Payer: Medicare Other | Admitting: Orthopedic Surgery

## 2017-05-18 ENCOUNTER — Encounter (INDEPENDENT_AMBULATORY_CARE_PROVIDER_SITE_OTHER): Payer: Self-pay

## 2017-05-19 ENCOUNTER — Ambulatory Visit: Payer: Medicare Other | Admitting: Student

## 2017-05-19 ENCOUNTER — Telehealth: Payer: Self-pay | Admitting: Family Medicine

## 2017-05-19 ENCOUNTER — Ambulatory Visit: Payer: Medicare Other | Admitting: Internal Medicine

## 2017-05-19 NOTE — Telephone Encounter (Signed)
She cannot come to her appt today because her guardian cannot make it.  She will see you on Dec. 3 but in the meantime, would like to know what she can do for a sinus infection and ear ache in left ear?  Best  # to reach her today please is [phone].

## 2017-05-23 ENCOUNTER — Other Ambulatory Visit: Payer: Self-pay | Admitting: Family Medicine

## 2017-05-23 MED ORDER — METFORMIN HCL 500 MG PO TABS: ORAL_TABLET | ORAL | 0 refills | Status: DC

## 2017-05-23 NOTE — Telephone Encounter (Signed)
Pt informed. Sharon T Saunders, CMA  

## 2017-05-23 NOTE — Telephone Encounter (Signed)
Spoke to pt. She has an appt on Dec 3rd. Pt stated she can not take over the counter medications or nasal spray. I asked pt if she would like for me to look for an appt before Dec 3rd and pt responed that she only wants to see her Dr. And she will just wait until Dec 3rd. I told pt that if her pain gets bad that she needs to come see [place] and to call if she wants to be seen. Pt seemed to understand and stated again that she would wait until Dec 3rd. Ottis Stain, CMA

## 2017-05-23 NOTE — Telephone Encounter (Signed)
Placed order. Please call patient and let her know.  Thank you, Cherly Anderson. Rosalyn Gess, [place] Medicine Resident PGY-2 05/23/2017 2:24 PM

## 2017-05-23 NOTE — Telephone Encounter (Signed)
Unfortunately there's really nothing I can give her.  If she does have a bacterial infection it might be reasonable to prescribe an antibiotic, but I cannot determine this without seeing her.  Otherwise supportive care should be enough (OTC medication, plenty of fluids and rest).   Daniel L. Rosalyn Gess, [place] Medicine Resident PGY-2 05/23/2017 2:25 PM

## 2017-05-23 NOTE — Telephone Encounter (Signed)
Pt called again because her metformin hasn't been refilled and she hasn't heard from the doctor or nurse about why it has not been refilled. Her pharmacy is [facility] on [address]. Please advise

## 2017-05-24 ENCOUNTER — Other Ambulatory Visit: Payer: Self-pay | Admitting: Family Medicine

## 2017-05-24 ENCOUNTER — Telehealth: Payer: Self-pay | Admitting: Family Medicine

## 2017-05-24 ENCOUNTER — Other Ambulatory Visit: Payer: Self-pay | Admitting: Internal Medicine

## 2017-05-24 DIAGNOSIS — E785 Hyperlipidemia, unspecified: Secondary | ICD-10-CM

## 2017-05-24 MED ORDER — LEVOTHYROXINE SODIUM 50 MCG PO TABS: 50.0000 ug | ORAL_TABLET | Freq: Every day | ORAL | 3 refills | Status: DC

## 2017-05-24 NOTE — Telephone Encounter (Signed)
Pt informed but she stated that she also needed a refill on her naproxen (naprosyn) for her abdominal muscle she said, she said that the cream doesn't work.  Told pt I would send a message to PCP to see about getting this refilled. Katharina Caper, April D, [place]

## 2017-05-24 NOTE — Telephone Encounter (Signed)
Filled.   Please let patient know.   Daniel L. Rosalyn Gess, [place] Medicine Resident PGY-2 05/24/2017 9:48 AM

## 2017-05-24 NOTE — Telephone Encounter (Signed)
Pt would like her levothyroxine refilled. Please advise

## 2017-05-30 ENCOUNTER — Telehealth: Payer: Self-pay | Admitting: Family Medicine

## 2017-05-30 NOTE — Telephone Encounter (Signed)
Pt used the cream she was given for pain but it didn't work but gave her a rash. She is in pain especially since it has gotten cold.  She would like a refill on hydrocodone.  She is unable to move her muscles because of the pain.  Please call when the prescription is ready for pickup.

## 2017-05-30 NOTE — Telephone Encounter (Signed)
Pt would also like a refill on her norvasc as well as hydrocodone. Please advise

## 2017-05-30 NOTE — Telephone Encounter (Signed)
Will forward to MD to advise. Jazmin Hartsell,CMA  

## 2017-05-31 NOTE — Telephone Encounter (Signed)
Pt  Called again about her pain med. She is unable to get out of bed.  Her bones are just not moving because of the cold weather.  Please refill her hydrocodene ASAP.  Please advise

## 2017-06-05 ENCOUNTER — Ambulatory Visit (INDEPENDENT_AMBULATORY_CARE_PROVIDER_SITE_OTHER): Payer: Medicare Other | Admitting: Family Medicine

## 2017-06-05 ENCOUNTER — Other Ambulatory Visit: Payer: Self-pay

## 2017-06-05 ENCOUNTER — Encounter: Payer: Self-pay | Admitting: Family Medicine

## 2017-06-05 VITALS — BP 92/64 | HR 91 | Temp 98.1°F | Ht 63.0 in | Wt 167.2 lb

## 2017-06-05 DIAGNOSIS — I10 Essential (primary) hypertension: Secondary | ICD-10-CM | POA: Diagnosis not present

## 2017-06-05 DIAGNOSIS — E119 Type 2 diabetes mellitus without complications: Secondary | ICD-10-CM

## 2017-06-05 DIAGNOSIS — J019 Acute sinusitis, unspecified: Secondary | ICD-10-CM | POA: Diagnosis not present

## 2017-06-05 LAB — POCT GLYCOSYLATED HEMOGLOBIN (HGB A1C): Hemoglobin A1C: 8.7

## 2017-06-05 MED ORDER — CIPROFLOXACIN-DEXAMETHASONE 0.3-0.1 % OT SUSP: 4.0000 [drp] | Freq: Two times a day (BID) | OTIC | 0 refills | Status: DC

## 2017-06-05 MED ORDER — AMOXICILLIN-POT CLAVULANATE 875-125 MG PO TABS: 1.0000 | ORAL_TABLET | Freq: Two times a day (BID) | ORAL | 0 refills | Status: AC

## 2017-06-05 MED ORDER — AMLODIPINE BESYLATE 5 MG PO TABS: 5.0000 mg | ORAL_TABLET | Freq: Every day | ORAL | 4 refills | Status: DC

## 2017-06-05 MED ORDER — HYDROCODONE-ACETAMINOPHEN 5-325 MG PO TABS: 1.0000 | ORAL_TABLET | Freq: Four times a day (QID) | ORAL | 0 refills | Status: DC | PRN

## 2017-06-05 NOTE — Progress Notes (Signed)
Subjective:  [name] Long is a 66 y.o. female who presents to the [facility] today with a chief complaint of abdominal pain  HPI:  Abdominal pain:   Reporting epigastric pain x6 months that radiates to her back. Fast food tends to bother her stomach more than other things.  Continues to eat lots of fried foods and smokes cigarettes daily.  She has no confirmed history of pancreatitis, however there was note of possible subclinical pancreatitis back in 2011 when she had vague abdominal pain with association with chronic alcohol abuse.  Denies any vomiting. Has been having loose stools for the past month without fevers, chills or weight loss.  Cough with sinus pain and ear pain Reports cough for the that has been worsening now with yellow sputum for the past month.  Denies any fevers, chills, palpitations, chest pain, lower extremity edema, erythema or pain.  Also reports worsening pain in her maxillary and frontal sinuses with some nasal drainage reported as clear.  Again she reports left ear pain that is chronic for her.  Last visit she also complained about this however I was unable to appreciate any abnormalities on exam and she had no systemic signs or symptoms at that time.  I recommended Sudafed and Flonase for suspected eustachian tube dysfunction and she reported that she developed some epistaxis and no improvement with her ear pain.  She is asking again for ciprofloxacin drops today.   Osteoarthritis History of multi-joint osteoarthritis.  This is comp gated by her comorbid mental health issues.  This is well documented by Dr. Lajuana Ripple back on 07/18/16 who did not refill her narcotics.  At that time she had recommended following up with Dr. Sharol Given for alternative medications versus injection therapies.  At this time she is complaining that her bilateral knees as well as lower back particularly her lumbar spine are hurting.  She denies any numbness or tingling in her bilateral lower extremities,  fevers, chills, weakness or loss of bowel or bladder function.   HTN History of hypertension, takes amlodipine 5 mg daily as well as hydrochlorothiazide 25 mg daily.  Blood pressure today 92/64 the patient denies any lightheadedness, dizziness, palpitations, chest pain or shortness of breath.    PMH: Type 2 diabetes, hypertension, chronic pain Tobacco use: Current smoker Medication: reviewed and updated ROS: see HPI   Objective:  Physical Exam: BP 92/64   Pulse 91   Temp 98.1 F (36.7 C) (Oral)   Ht 5\' 3"  (1.6 m)   Wt 167 lb 3.2 oz (75.8 kg)   SpO2 97%   BMI 29.62 kg/m   Gen: 65 NAD, resting comfortably CV: RRR with no murmurs appreciated Pulm: NWOB, CTAB with no crackles, wheezes, or rhonchi GI: Normal bowel sounds present. Soft, Nontender, Nondistended. MSK: No gross deformities or edema, bilateral paraspinal muscle tenderness worse in the lumbar spine and thoracic spine with no spinal tenderness. Skin: warm, dry Neuro: grossly normal, moves all extremities Psych: Normal affect and thought content  No results found for this or any previous visit (from the past 72 hour(s)).   Assessment/Plan:  HYPERTENSION, BENIGN SYSTEMIC Patient currently taking amlodipine and hydrochlorothiazide daily.  Blood pressure today 92/64 and patient denies any symptoms.  She denies any decreased p.o. intake, vomiting or diarrhea or feelings of lightheadedness dizziness.  I have asked her to hold her blood pressure medication and come back for nursing visit on 06/09/2017 for repeat blood pressure.   Productive cough with sinus pain and left  ear pain patient is afebrile without chest pain, palpitations, lower extremity swelling, erythema or pain symptoms most consistent with bronchitis versus bacterial sinusitis.  No focal consolidation or crackles on lung exam making this less likely to be pneumonia we will treat with course of Augmentin x5 days.  No improvement in the ear pain since my last visit  with her with Sudafed and Flonase.  Will try empirically with Cipro/dexamethasone otic drops. Discussed return precautions with patient.   Abdominal pain Unclear etiology at this time.  Patient has no red flag signs or symptoms.  No abdominal pain on exam.  Based on her description of her diet and her continued cigarette use this could be GERD.  She is currently on a PPI.  I recommended that she modify her diet to remove fast food including fried foods to start and to eliminate cigarettes.  If symptoms persist will order lipase to rule out subclinical pancreatitis multi-joint arthritis.   Multi-joint osteoarthritis Does have bilateral paraspinal tenderness at the lumbar and thoracic areas with no spinal tenderness.  Additionally has history of bilateral osteoarthritis of the knees and has received multiple injections in the past.  She is complaining of exquisite pain today in the office and reports that she is not able to get out of bed due to pain.  I have filled Norco 5-325mg  q6hrs PRN dispensed 10 tablets.  I will had a long conversation with her at her next visit and will tell her that I would not be prescribing any more narcotics.   Daniel L. Rosalyn Gess, [place] [facility] Resident PGY-2 06/12/2017 3:23 PM

## 2017-06-05 NOTE — Patient Instructions (Signed)
[name] Long,  He was seen today for a checkup for abdominal pain, sinus infection ongoing arthritis and affect were also noted to have low blood pressure.  For your abdominal pain I would recommend stopping eating fast food as you mentioned that this type of food tends to bother your stomach the most.  I think this is a good first step to make you feel better and to avoid having loose stools.  Additionally quitting smoking would also help prevent some of the heartburn and irritation.  As for her cough and sinus pain and your ongoing ear pain, I have prescribed you Augmentin please take this 2 times daily for the next 5 days.  It sounds like he may have a sinus infection.  I also prescribed you Ciprodex drops for your left ear for your ongoing ear pain.  For you arthritis I prescribed you some pain medication.   Your blood pressure was also low today I would recommend that you stop your hydrochlorothiazide and Norvasc and follow-up at our clinic on 06/09/2017 for nursing visit to have your blood pressure checked.  At that time I will call you with any recommendations I have.   Nice to see you today!  Daniel L. Rosalyn Gess, [place] Resident PGY-2 06/05/2017 4:32 PM

## 2017-06-08 ENCOUNTER — Telehealth: Payer: Self-pay | Admitting: Family Medicine

## 2017-06-08 MED ORDER — ALBUTEROL SULFATE HFA 108 (90 BASE) MCG/ACT IN AERS: 2.0000 | INHALATION_SPRAY | RESPIRATORY_TRACT | 2 refills | Status: DC | PRN

## 2017-06-08 NOTE — Telephone Encounter (Signed)
Needs refill on ventolin solution and inhaler.  [facility]  [employer] and [employer]

## 2017-06-09 NOTE — Telephone Encounter (Signed)
Pt wants refills on both inhalers, ventolin solution and metformin. Please advise

## 2017-06-10 ENCOUNTER — Other Ambulatory Visit: Payer: Self-pay | Admitting: Family Medicine

## 2017-06-10 MED ORDER — ALBUTEROL SULFATE (2.5 MG/3ML) 0.083% IN NEBU: 2.5000 mg | INHALATION_SOLUTION | Freq: Four times a day (QID) | RESPIRATORY_TRACT | 1 refills | Status: DC | PRN

## 2017-06-10 MED ORDER — FLUTICASONE PROPIONATE 50 MCG/ACT NA SUSP: 2.0000 | Freq: Every day | NASAL | 1 refills | Status: DC

## 2017-06-10 MED ORDER — ALBUTEROL SULFATE HFA 108 (90 BASE) MCG/ACT IN AERS: 2.0000 | INHALATION_SPRAY | RESPIRATORY_TRACT | 2 refills | Status: DC | PRN

## 2017-06-10 MED ORDER — FLUTICASONE PROPIONATE HFA 110 MCG/ACT IN AERO: 1.0000 | INHALATION_SPRAY | Freq: Two times a day (BID) | RESPIRATORY_TRACT | 12 refills | Status: DC

## 2017-06-10 MED ORDER — METFORMIN HCL 500 MG PO TABS: ORAL_TABLET | ORAL | 0 refills | Status: DC

## 2017-06-12 ENCOUNTER — Encounter: Payer: Self-pay | Admitting: Family Medicine

## 2017-06-12 NOTE — Assessment & Plan Note (Signed)
Patient currently taking amlodipine and hydrochlorothiazide daily.  Blood pressure today 92/64 and patient denies any symptoms.  She denies any decreased p.o. intake, vomiting or diarrhea or feelings of lightheadedness dizziness.  I have asked her to hold her blood pressure medication and come back for nursing visit on 06/09/2017 for repeat blood pressure.

## 2017-06-14 ENCOUNTER — Ambulatory Visit (INDEPENDENT_AMBULATORY_CARE_PROVIDER_SITE_OTHER): Payer: Medicare Other | Admitting: Orthopedic Surgery

## 2017-06-14 ENCOUNTER — Telehealth: Payer: Self-pay | Admitting: *Deleted

## 2017-06-14 NOTE — Telephone Encounter (Signed)
Received fax from [facility] requesting prior authorization of Albuterol solution.  Spoke with pharmacist and she will fax a [place] form for completion by PCP.  Velora Heckler, RN

## 2017-06-15 NOTE — Telephone Encounter (Signed)
I am not currently on site. Will fill out on 06/16/17.

## 2017-06-15 NOTE — Telephone Encounter (Signed)
Detailed Written Order in PCP's box for completion and signature. Hubbard Hartshorn, RN, BSN

## 2017-06-29 ENCOUNTER — Telehealth: Payer: Self-pay | Admitting: Family Medicine

## 2017-06-29 NOTE — Telephone Encounter (Signed)
Pt is calling because she said that the pharmacy has been faxing and calling us for several of her medication and we are not responding. She also said that a Prior Authorization was need for some and they have received the approval yet. She said that this has been going on since 06/16/17. Blima Rich

## 2017-06-29 NOTE — Telephone Encounter (Signed)
Pt said her pharmacy never received authorization to refill the pt albuterol solution. Please advise

## 2017-06-29 NOTE — Telephone Encounter (Signed)
Pt needs refill on hydrocodone, hydrochlorothiazide, strips for free style lite blood thinner machine, and some type of sodium med. Pt also said the pharmacy said they never received the refill for the albuterol solution

## 2017-06-30 ENCOUNTER — Other Ambulatory Visit: Payer: Self-pay | Admitting: Family Medicine

## 2017-06-30 NOTE — Telephone Encounter (Signed)
Forms are signed and in in the fax pile.  Daniel L. Rosalyn Gess, [place] [facility] Resident PGY-2 06/30/2017 8:33 AM

## 2017-06-30 NOTE — Telephone Encounter (Signed)
I will not be refilling her hydrocodone.   I asked her to come back for a nursing visit to have her blood pressure checked because her pressures were in the 90s when I last saw her.  She needs to come back in for a blood pressure check before I am going to refill anything. A nursing visit is fine.   She is on metformin and does not take insulin and does not need strips.   Daniel L. Rosalyn Gess, [place] Medicine Resident PGY-2 06/30/2017 8:36 AM

## 2017-07-05 ENCOUNTER — Other Ambulatory Visit (HOSPITAL_COMMUNITY)
Admission: RE | Admit: 2017-07-05 | Discharge: 2017-07-05 | Disposition: A | Discharge status: Home / Self Care | Payer: Medicare Other | Source: Ambulatory Visit | Attending: Family Medicine | Admitting: Family Medicine

## 2017-07-05 ENCOUNTER — Ambulatory Visit (INDEPENDENT_AMBULATORY_CARE_PROVIDER_SITE_OTHER): Payer: Medicare Other | Admitting: Internal Medicine

## 2017-07-05 ENCOUNTER — Encounter: Payer: Self-pay | Admitting: Internal Medicine

## 2017-07-05 ENCOUNTER — Ambulatory Visit (HOSPITAL_COMMUNITY)
Admission: RE | Admit: 2017-07-05 | Discharge: 2017-07-05 | Disposition: A | Discharge status: Home / Self Care | Payer: Medicare Other | Source: Ambulatory Visit | Attending: Family Medicine | Admitting: Family Medicine

## 2017-07-05 ENCOUNTER — Other Ambulatory Visit: Payer: Self-pay

## 2017-07-05 ENCOUNTER — Ambulatory Visit: Payer: Self-pay

## 2017-07-05 VITALS — BP 150/82 | HR 110 | Temp 97.9°F | Wt 170.0 lb

## 2017-07-05 DIAGNOSIS — I4891 Unspecified atrial fibrillation: Secondary | ICD-10-CM | POA: Diagnosis not present

## 2017-07-05 DIAGNOSIS — B9689 Other specified bacterial agents as the cause of diseases classified elsewhere: Secondary | ICD-10-CM

## 2017-07-05 DIAGNOSIS — R399 Unspecified symptoms and signs involving the genitourinary system: Secondary | ICD-10-CM | POA: Insufficient documentation

## 2017-07-05 DIAGNOSIS — N760 Acute vaginitis: Secondary | ICD-10-CM

## 2017-07-05 DIAGNOSIS — I480 Paroxysmal atrial fibrillation: Secondary | ICD-10-CM | POA: Insufficient documentation

## 2017-07-05 LAB — POCT URINALYSIS DIP (MANUAL ENTRY)
Bilirubin, UA: NEGATIVE
Blood, UA: NEGATIVE
Glucose, UA: NEGATIVE mg/dL
Ketones, POC UA: NEGATIVE mg/dL
Leukocytes, UA: NEGATIVE
Nitrite, UA: NEGATIVE
Protein Ur, POC: NEGATIVE mg/dL
Spec Grav, UA: 1.015 (ref 1.010–1.025)
Urobilinogen, UA: 0.2 E.U./dL
pH, UA: 6.5 (ref 5.0–8.0)

## 2017-07-05 LAB — POCT WET PREP (WET MOUNT)
Clue Cells Wet Prep Whiff POC: POSITIVE
Trichomonas Wet Prep HPF POC: ABSENT

## 2017-07-05 MED ORDER — METFORMIN HCL 500 MG PO TABS: ORAL_TABLET | ORAL | 0 refills | Status: DC

## 2017-07-05 MED ORDER — METRONIDAZOLE 500 MG PO TABS: 500.0000 mg | ORAL_TABLET | Freq: Two times a day (BID) | ORAL | 0 refills | Status: DC

## 2017-07-05 NOTE — Assessment & Plan Note (Signed)
per chart review, it looks like this was first noticed during admission for COPD exacerbation in 12/2016. At that time, she was started on Metoprolol and Eliquis. Patient does not recall this. She denies being admitted to the hospital or ever being on medication reporting "that wasn't me; they are putting other people's stuff on my chart". She has a history of significant psych diagnoses including delusions. Repeat EKG with irregular rhythm but possible P waves. The EKG prior to today showed atrial fibrillation. CHADSVASC score 4. We discussed the increased risk of thrombus formation and stroke with atrial fibrillation.  Patient initially agreed to start Metoprolol and Eliquis. However, at the end of the visit she requested refill of narcotic medication. Per her PCP's last note, he already discussed with patient that there will be no further refills of narcotic medication. When refill was denied, she declined Metoprolol and Eliquis and reported that she will wait to go see Dr. Doylene Canard. Patient left before completing the visit.

## 2017-07-05 NOTE — Telephone Encounter (Signed)
Patient was seen by Dr. Dallas Schimke today. Jazmin Hartsell,CMA

## 2017-07-05 NOTE — Progress Notes (Signed)
   [facility] Phone: [phone]   Date of Visit: 07/05/2017   HPI:  Urinary Frequency and Vaginal Discharge:  - reports of increased urinary frequency and urgency for a few weeks - also reports of yellow discharge which she noticed a little before christmas. Also has vaginal itching - no hematuria or dysuria, no fever, chills or flank pain.  - reports that she has not been sexually active for 6 months but would like screening for STI  Irregular Heart Beat:  - noticed on exam today. Patient is not symptomatic - per chart review, it looks like this was first noticed during admission for COPD exacerbation in 12/2016. At that time, she was started on Metoprolol and Eliquis. Patient does not recall this. She denies being admitted to the hospital or ever being on medication reporting "that wasn't me; they are putting other people's stuff on my chart". She has a history of significant psych diagnoses including delusions. Please refer to assessment and plan.   ROS: See HPI.  [place]:  PMH:  HTN CHF COPD Hypothyroidism DM2 CKD II HLD Tobacco Use Vitamin D Deficiency Schixoaffective DO, bipolar type   PHYSICAL EXAM: BP (!) 150/82   Pulse (!) 110   Temp 97.9 F (36.6 C) (Oral)   Wt 170 lb (77.1 kg)   SpO2 98%   BMI 30.11 kg/m  GEN: NAD CV: RRR, no murmurs, rubs, or gallops PULM: CTAB, normal effort ABD: Soft, nontender, nondistended, NABS, no organomegaly Female genitalia: normal external genitalia, vulva, vagina, cervix, uterus and adnexa, mild white discharge noted.  SKIN: No rash or cyanosis; warm and well-perfused EXTR: No lower extremity edema or calf tenderness PSYCH: Mood and affect euthymic, normal rate and volume of speech NEURO: Awake, alert, no focal deficits grossly, normal speech   ASSESSMENT/PLAN:  Atrial fibrillation ([place]) per chart review, it looks like this was first noticed during admission for COPD exacerbation in 12/2016. At that  time, she was started on Metoprolol and Eliquis. Patient does not recall this. She denies being admitted to the hospital or ever being on medication reporting "that wasn't me; they are putting other people's stuff on my chart". She has a history of significant psych diagnoses including delusions. Repeat EKG with irregular rhythm but possible P waves. The EKG prior to today showed atrial fibrillation. CHADSVASC score 4. We discussed the increased risk of thrombus formation and stroke with atrial fibrillation.  Patient initially agreed to start Metoprolol and Eliquis. However, at the end of the visit she requested refill of narcotic medication. Per her PCP's last note, he already discussed with patient that there will be no further refills of narcotic medication. When refill was denied, she declined Metoprolol and Eliquis and reported that she will wait to go see Dr. Doylene Canard. Patient left before completing the visit.   Urinary Frequency/Vaginal Discharge:  Wet prep positive for BV. Flagyl 500mg  BID x 7 days prescribed. Counseled on avoiding alcohol with this medication. UA normal. Gc/Chlamydia testing obtained. Near the end of the visit, patient declined HIV/RPR (left before completing vist as noted above)  Smiley Houseman, MD PGY [place]

## 2017-07-06 LAB — CERVICOVAGINAL ANCILLARY ONLY
Chlamydia: NEGATIVE
Neisseria Gonorrhea: NEGATIVE

## 2017-07-07 ENCOUNTER — Telehealth: Payer: Self-pay | Admitting: *Deleted

## 2017-07-07 NOTE — Telephone Encounter (Signed)
-----   Message from Smiley Houseman, MD sent at 07/06/2017  7:20 PM EST ----- Please inform patient that her Gc/Chlamydia tests are negative. Please also remind her that she should call her cardiologist and follow up with him as soon as possible regarding her irregular rhythm since she elected not to start the medications that we recommended in clinic.

## 2017-07-07 NOTE — Telephone Encounter (Signed)
LM for patient with negative results. Jazmin Hartsell,CMA

## 2017-07-07 NOTE — Telephone Encounter (Signed)
Pt returning dr's call, read off the message to her and she wanted me to inform her dr that she has made an appointment with her cardiologist for 1/10.

## 2017-07-10 ENCOUNTER — Inpatient Hospital Stay (HOSPITAL_COMMUNITY): Payer: Medicare Other

## 2017-07-10 ENCOUNTER — Inpatient Hospital Stay (HOSPITAL_COMMUNITY)
Admission: AD | Admit: 2017-07-10 | Discharge: 2017-07-12 | DRG: 309 | Disposition: A | Discharge status: Home / Self Care | Payer: Medicare Other | Source: Ambulatory Visit | Attending: Cardiovascular Disease | Admitting: Cardiovascular Disease

## 2017-07-10 ENCOUNTER — Other Ambulatory Visit: Payer: Self-pay

## 2017-07-10 ENCOUNTER — Telehealth: Payer: Self-pay | Admitting: Family Medicine

## 2017-07-10 DIAGNOSIS — I10 Essential (primary) hypertension: Secondary | ICD-10-CM | POA: Diagnosis present

## 2017-07-10 DIAGNOSIS — I4891 Unspecified atrial fibrillation: Secondary | ICD-10-CM | POA: Diagnosis present

## 2017-07-10 DIAGNOSIS — I483 Typical atrial flutter: Secondary | ICD-10-CM | POA: Diagnosis not present

## 2017-07-10 DIAGNOSIS — I4892 Unspecified atrial flutter: Secondary | ICD-10-CM | POA: Diagnosis present

## 2017-07-10 DIAGNOSIS — E7800 Pure hypercholesterolemia, unspecified: Secondary | ICD-10-CM | POA: Diagnosis present

## 2017-07-10 DIAGNOSIS — E1122 Type 2 diabetes mellitus with diabetic chronic kidney disease: Secondary | ICD-10-CM | POA: Diagnosis present

## 2017-07-10 DIAGNOSIS — E669 Obesity, unspecified: Secondary | ICD-10-CM | POA: Diagnosis present

## 2017-07-10 DIAGNOSIS — Z7901 Long term (current) use of anticoagulants: Secondary | ICD-10-CM | POA: Diagnosis not present

## 2017-07-10 DIAGNOSIS — Z791 Long term (current) use of non-steroidal anti-inflammatories (NSAID): Secondary | ICD-10-CM

## 2017-07-10 DIAGNOSIS — E876 Hypokalemia: Secondary | ICD-10-CM | POA: Diagnosis present

## 2017-07-10 DIAGNOSIS — E039 Hypothyroidism, unspecified: Secondary | ICD-10-CM | POA: Diagnosis present

## 2017-07-10 DIAGNOSIS — R0603 Acute respiratory distress: Secondary | ICD-10-CM | POA: Diagnosis present

## 2017-07-10 DIAGNOSIS — F319 Bipolar disorder, unspecified: Secondary | ICD-10-CM | POA: Diagnosis present

## 2017-07-10 DIAGNOSIS — I129 Hypertensive chronic kidney disease with stage 1 through stage 4 chronic kidney disease, or unspecified chronic kidney disease: Secondary | ICD-10-CM | POA: Diagnosis present

## 2017-07-10 DIAGNOSIS — Z885 Allergy status to narcotic agent status: Secondary | ICD-10-CM

## 2017-07-10 DIAGNOSIS — Z79899 Other long term (current) drug therapy: Secondary | ICD-10-CM | POA: Diagnosis not present

## 2017-07-10 DIAGNOSIS — F17210 Nicotine dependence, cigarettes, uncomplicated: Secondary | ICD-10-CM | POA: Diagnosis present

## 2017-07-10 DIAGNOSIS — Z7984 Long term (current) use of oral hypoglycemic drugs: Secondary | ICD-10-CM

## 2017-07-10 DIAGNOSIS — R0602 Shortness of breath: Secondary | ICD-10-CM

## 2017-07-10 DIAGNOSIS — J441 Chronic obstructive pulmonary disease with (acute) exacerbation: Secondary | ICD-10-CM | POA: Diagnosis not present

## 2017-07-10 DIAGNOSIS — E6609 Other obesity due to excess calories: Secondary | ICD-10-CM | POA: Diagnosis not present

## 2017-07-10 DIAGNOSIS — Z886 Allergy status to analgesic agent status: Secondary | ICD-10-CM | POA: Diagnosis not present

## 2017-07-10 DIAGNOSIS — E119 Type 2 diabetes mellitus without complications: Secondary | ICD-10-CM

## 2017-07-10 DIAGNOSIS — Z888 Allergy status to other drugs, medicaments and biological substances status: Secondary | ICD-10-CM | POA: Diagnosis not present

## 2017-07-10 DIAGNOSIS — Z6830 Body mass index (BMI) 30.0-30.9, adult: Secondary | ICD-10-CM | POA: Diagnosis not present

## 2017-07-10 DIAGNOSIS — F17200 Nicotine dependence, unspecified, uncomplicated: Secondary | ICD-10-CM

## 2017-07-10 DIAGNOSIS — N182 Chronic kidney disease, stage 2 (mild): Secondary | ICD-10-CM | POA: Diagnosis present

## 2017-07-10 DIAGNOSIS — Z720 Tobacco use: Secondary | ICD-10-CM | POA: Diagnosis present

## 2017-07-10 DIAGNOSIS — Z7982 Long term (current) use of aspirin: Secondary | ICD-10-CM | POA: Diagnosis not present

## 2017-07-10 DIAGNOSIS — R0600 Dyspnea, unspecified: Secondary | ICD-10-CM | POA: Diagnosis not present

## 2017-07-10 HISTORY — DX: Low back pain, unspecified: M54.50

## 2017-07-10 HISTORY — DX: Scoliosis, unspecified: M41.9

## 2017-07-10 HISTORY — DX: Other chronic pain: G89.29

## 2017-07-10 HISTORY — DX: Dyspnea, unspecified: R06.00

## 2017-07-10 HISTORY — DX: Pneumonia, unspecified organism: J18.9

## 2017-07-10 HISTORY — DX: Unspecified osteoarthritis, unspecified site: M19.90

## 2017-07-10 HISTORY — DX: Low back pain: M54.5

## 2017-07-10 LAB — COMPREHENSIVE METABOLIC PANEL
ALT: 11 U/L — ABNORMAL LOW (ref 14–54)
AST: 17 U/L (ref 15–41)
Albumin: 2.8 g/dL — ABNORMAL LOW (ref 3.5–5.0)
Alkaline Phosphatase: 67 U/L (ref 38–126)
Anion gap: 12 (ref 5–15)
BUN: 9 mg/dL (ref 6–20)
CO2: 25 mmol/L (ref 22–32)
Calcium: 8.4 mg/dL — ABNORMAL LOW (ref 8.9–10.3)
Chloride: 100 mmol/L — ABNORMAL LOW (ref 101–111)
Creatinine, Ser: 0.8 mg/dL (ref 0.44–1.00)
GFR calc Af Amer: >60 mL/min (ref >60)
GFR calc non Af Amer: >60 mL/min (ref >60)
Glucose, Bld: 167 mg/dL — ABNORMAL HIGH (ref 65–99)
Potassium: 3.3 mmol/L — ABNORMAL LOW (ref 3.5–5.1)
Sodium: 137 mmol/L (ref 135–145)
Total Bilirubin: 0.7 mg/dL (ref 0.3–1.2)
Total Protein: 6.3 g/dL — ABNORMAL LOW (ref 6.5–8.1)

## 2017-07-10 LAB — CBC WITH DIFFERENTIAL/PLATELET
Basophils Absolute: 0 10*3/uL (ref 0.0–0.1)
Basophils Relative: 0 %
Eosinophils Absolute: 0.1 10*3/uL (ref 0.0–0.7)
Eosinophils Relative: 2 %
HCT: 36.2 % (ref 36.0–46.0)
Hemoglobin: 12.2 g/dL (ref 12.0–15.0)
Lymphocytes Relative: 54 %
Lymphs Abs: 3.6 10*3/uL (ref 0.7–4.0)
MCH: 35.3 pg — ABNORMAL HIGH (ref 26.0–34.0)
MCHC: 33.7 g/dL (ref 30.0–36.0)
MCV: 104.6 fL — ABNORMAL HIGH (ref 78.0–100.0)
Monocytes Absolute: 0.5 10*3/uL (ref 0.1–1.0)
Monocytes Relative: 8 %
Neutro Abs: 2.4 10*3/uL (ref 1.7–7.7)
Neutrophils Relative %: 36 %
Platelets: 149 10*3/uL — ABNORMAL LOW (ref 150–400)
RBC: 3.46 MIL/uL — ABNORMAL LOW (ref 3.87–5.11)
RDW: 15.1 % (ref 11.5–15.5)
WBC: 6.6 10*3/uL (ref 4.0–10.5)

## 2017-07-10 LAB — TSH: TSH: 1.416 u[IU]/mL (ref 0.350–4.500)

## 2017-07-10 LAB — HEMOGLOBIN A1C
Hgb A1c MFr Bld: 8.1 % — ABNORMAL HIGH (ref 4.8–5.6)
Mean Plasma Glucose: 185.77 mg/dL

## 2017-07-10 MED ORDER — LEVOTHYROXINE SODIUM 50 MCG PO TABS
50.0000 ug | ORAL_TABLET | Freq: Every day | ORAL | Status: DC
  Administered 2017-07-11 – 2017-07-12 (×2): 50 ug via ORAL
  Filled 2017-07-10 (×3): qty 1

## 2017-07-10 MED ORDER — NICOTINE 14 MG/24HR TD PT24
14.0000 mg | MEDICATED_PATCH | Freq: Every day | TRANSDERMAL | Status: DC
Start: 2017-07-10 — End: 2017-07-12
  Administered 2017-07-10 – 2017-07-12 (×2): 14 mg via TRANSDERMAL
  Filled 2017-07-10 (×3): qty 1

## 2017-07-10 MED ORDER — LORAZEPAM 0.5 MG PO TABS
0.5000 mg | ORAL_TABLET | Freq: Two times a day (BID) | ORAL | Status: DC
  Administered 2017-07-10 – 2017-07-12 (×4): 0.5 mg via ORAL
  Filled 2017-07-10 (×4): qty 1

## 2017-07-10 MED ORDER — METFORMIN HCL 500 MG PO TABS
1000.0000 mg | ORAL_TABLET | Freq: Every day | ORAL | Status: DC
  Administered 2017-07-11 – 2017-07-12 (×2): 1000 mg via ORAL
  Filled 2017-07-10 (×2): qty 2

## 2017-07-10 MED ORDER — DIVALPROEX SODIUM 500 MG PO DR TAB
1000.0000 mg | DELAYED_RELEASE_TABLET | ORAL | Status: DC
Start: 2017-07-10 — End: 2017-07-12
  Administered 2017-07-10 – 2017-07-12 (×5): 1000 mg via ORAL
  Filled 2017-07-10 (×6): qty 2

## 2017-07-10 MED ORDER — NITROGLYCERIN 0.4 MG SL SUBL: 0.4000 mg | SUBLINGUAL_TABLET | SUBLINGUAL | Status: DC | PRN

## 2017-07-10 MED ORDER — PRAVASTATIN SODIUM 40 MG PO TABS
40.0000 mg | ORAL_TABLET | Freq: Every evening | ORAL | Status: DC
  Administered 2017-07-10 – 2017-07-12 (×2): 40 mg via ORAL
  Filled 2017-07-10 (×3): qty 1

## 2017-07-10 MED ORDER — APIXABAN 5 MG PO TABS
5.0000 mg | ORAL_TABLET | Freq: Two times a day (BID) | ORAL | Status: DC
  Administered 2017-07-10 – 2017-07-12 (×4): 5 mg via ORAL
  Filled 2017-07-10 (×4): qty 1

## 2017-07-10 MED ORDER — ALBUTEROL SULFATE (2.5 MG/3ML) 0.083% IN NEBU
2.5000 mg | INHALATION_SOLUTION | Freq: Four times a day (QID) | RESPIRATORY_TRACT | Status: DC | PRN
  Administered 2017-07-10 – 2017-07-11 (×2): 2.5 mg via RESPIRATORY_TRACT
  Filled 2017-07-10 (×2): qty 3

## 2017-07-10 MED ORDER — OLOPATADINE HCL 0.1 % OP SOLN
1.0000 [drp] | Freq: Two times a day (BID) | OPHTHALMIC | Status: DC
Start: 2017-07-10 — End: 2017-07-12
  Administered 2017-07-11 – 2017-07-12 (×2): 1 [drp] via OPHTHALMIC
  Filled 2017-07-10: qty 5

## 2017-07-10 MED ORDER — HEPARIN SODIUM (PORCINE) 5000 UNIT/ML IJ SOLN: 5000.0000 [IU] | Freq: Three times a day (TID) | INTRAMUSCULAR | Status: DC

## 2017-07-10 MED ORDER — DILTIAZEM HCL-DEXTROSE 100-5 MG/100ML-% IV SOLN (PREMIX)
10.0000 mg/h | INTRAVENOUS | Status: DC
  Administered 2017-07-10: 5 mg/h via INTRAVENOUS
  Administered 2017-07-11 – 2017-07-12 (×3): 10 mg/h via INTRAVENOUS
  Filled 2017-07-10 (×4): qty 100

## 2017-07-10 MED ORDER — PANTOPRAZOLE SODIUM 40 MG PO TBEC
40.0000 mg | DELAYED_RELEASE_TABLET | Freq: Every day | ORAL | Status: DC
  Administered 2017-07-10 – 2017-07-12 (×3): 40 mg via ORAL
  Filled 2017-07-10 (×3): qty 1

## 2017-07-10 MED ORDER — RISPERIDONE 1 MG PO TABS
1.0000 mg | ORAL_TABLET | Freq: Every day | ORAL | Status: DC
Start: 2017-07-10 — End: 2017-07-12
  Administered 2017-07-10 – 2017-07-11 (×2): 1 mg via ORAL
  Filled 2017-07-10 (×3): qty 1

## 2017-07-10 NOTE — H&P (Signed)
Referring Physician:  [name] is an 67 y.o. female.                       Chief Complaint: Palpitation and shortness of breath  HPI: 67 year old female with bipolar disorder, CKD, COPD, type II DM, hypertension and hypothyroidism has one day history of palpitation and shortness of breath. In office she had atrial flutter with 2:1 conduction and ventricular rate of 150 beats per minute on EKG.  Past Medical History:  Diagnosis Date  . Asthma   . BIPOLAR DISORDER 08/31/2006   Qualifier: Diagnosis of  By: Dorathy Daft MD, Marjory Lies    . CHRONIC KIDNEY DISEASE STAGE II (MILD) 09/14/2009   Annotation: eGFR 64 Qualifier: Diagnosis of  By: Jess Barters MD, [name]    . COPD (chronic obstructive pulmonary disease) ([place]) 09/23/2010   [facility] (Dr. Annamaria Boots)   . DM (diabetes mellitus) type II controlled with renal manifestation ([place]) 08/31/2006   Qualifier: Diagnosis of  By: Dorathy Daft MD, Marjory Lies    . HYPERCHOLESTEROLEMIA 08/31/2006   Intolerance to Lipitor OK on Crestor but medicaid no longer covering    . HYPERTENSION, BENIGN SYSTEMIC 08/31/2006   Qualifier: Diagnosis of  By: Dorathy Daft MD, Marjory Lies    . HYPOTHYROIDISM, UNSPECIFIED 08/31/2006   Qualifier: Diagnosis of  By: Dorathy Daft MD, Marjory Lies        Past Surgical History:  Procedure Laterality Date  . CESAREAN SECTION    . FOOT SURGERY    . KNEE SURGERY Bilateral     Family History  Problem Relation Age of Onset  . Breast cancer Sister    Social History:  reports that she has been smoking cigarettes.  She has a 19.50 pack-year smoking history. she has never used smokeless tobacco. She reports that she drinks alcohol. She reports that she does not use drugs.  Allergies:  Allergies  Allergen Reactions  . Ibuprofen Other (See Comments)    Unknown  . Lipitor [Atorvastatin Calcium] Other (See Comments)    Body aches  . Lisinopril Other (See Comments)    REACTION: PER DR. Melvyn Novas  . Codeine Rash    Facility-Administered  Medications Prior to Admission  Medication Dose Route Frequency Provider Last Rate Last Dose  . ipratropium-albuterol (DUONEB) 0.5-2.5 (3) MG/3ML nebulizer solution 3 mL  3 mL Nebulization Q6H Eloise Levels, MD       Medications Prior to Admission  Medication Sig Dispense Refill  . amLODipine (NORVASC) 5 MG tablet Take 1 tablet (5 mg total) by mouth daily. (Patient not taking: Reported on 07/05/2017) 90 tablet 4  . apixaban (ELIQUIS) 5 MG TABS tablet Take 1 tablet (5 mg total) by mouth 2 (two) times daily. (Patient not taking: Reported on 07/05/2017) 60 tablet 0  . aspirin ([employer]IL[employer]ENS ASPIRIN) 81 MG chewable tablet Chew 81 mg by mouth daily.      . Blood Glucose Monitoring Suppl (ONETOUCH VERIO) w/Device KIT 1 kit by Does not apply route 4 (four) times daily. ICD-10 Code: E11.9. 1 kit 0  . divalproex (DEPAKOTE) 500 MG DR tablet Take 2 tablets (1,000 mg total) by mouth 2 (two) times daily. 60 tablet 0  . fluticasone (FLOVENT HFA) 110 MCG/ACT inhaler Inhale 1 puff into the lungs 2 (two) times daily. 1 Inhaler 12  . glucose blood (ONETOUCH VERIO) test strip Use as instructed to test blood sugar up to 4 times daily. ICD-10 code: E11.9. 100 each 12  . hydrochlorothiazide (HYDRODIURIL)  25 MG tablet Take 25 mg by mouth daily.    Mar[place] Kitchen HYDROcodone-acetaminophen (NORCO/VICODIN) 5-325 MG tablet Take 1 tablet by mouth every 6 (six) hours as needed for moderate pain. 10 tablet 0  . levothyroxine (SYNTHROID) 50 MCG tablet Take 1 tablet (50 mcg total) by mouth daily before breakfast. 90 tablet 3  . LORazepam (ATIVAN) 0.5 MG tablet Take 1 tablet (0.5 mg total) by mouth 2 (two) times daily. (Patient taking differently: Take 1 mg by mouth 3 (three) times daily. ) 60 tablet 0  . LORazepam (ATIVAN) 1 MG tablet     . metFORMIN (GLUCOPHAGE) 500 MG tablet take 2 tablets by mouth once daily with BREAKFAST 30 tablet 0  . metroNIDAZOLE (FLAGYL) 500 MG tablet Take 1 tablet (500 mg total) by mouth 2 (two) times daily. For 7  days. Do not drink alcohol while taking this medication. 14 tablet 0  . naproxen (NAPROSYN) 500 MG tablet Take 1 tablet (500 mg total) by mouth 2 (two) times daily with a meal. (Patient not taking: Reported on 07/05/2017) 60 tablet 0  . nicotine (NICODERM CQ - DOSED IN MG/24 HOURS) 14 mg/24hr patch Place 1 patch (14 mg total) onto the skin daily. (Patient taking differently: Place 14 mg onto the skin daily as needed (smoking cessation). ) 28 patch 0  . nitroGLYCERIN (NITROSTAT) 0.4 MG SL tablet Place 1 tablet (0.4 mg total) under the tongue every 5 (five) minutes as needed for chest pain. 50 tablet 3  . olopatadine (PATANOL) 0.1 % ophthalmic solution Place 1 drop into both eyes 2 (two) times daily. (Patient taking differently: Place 1 drop into both eyes 2 (two) times daily as needed for allergies. ) 5 mL 2  . omeprazole (PRILOSEC) 20 MG capsule Take 1 capsule (20 mg total) by mouth daily. 90 capsule 1  . ONETOUCH DELICA LANCETS FINE MISC 1 each by Does not apply route 4 (four) times daily. ICD-10 Code: E11.9 100 each 12  . pravastatin (PRAVACHOL) 40 MG tablet take 1 tablet by mouth every evening 30 tablet 9  . prednisoLONE acetate (PRED FORTE) 1 % ophthalmic suspension Place 1 drop into both eyes daily.    . risperiDONE (RISPERDAL) 1 MG tablet Take 1 tablet (1 mg total) by mouth 2 (two) times daily. (Patient taking differently: Take 1 mg by mouth at bedtime. ) 60 tablet 1  . trihexyphenidyl (ARTANE) 5 MG tablet Take 1 tablet (5 mg total) by mouth 2 (two) times daily with a meal. (Patient not taking: Reported on 07/05/2017) 60 tablet 0  . [DISCONTINUED] albuterol (PROVENTIL HFA) 108 (90 Base) MCG/ACT inhaler Inhale 2 puffs into the lungs every 4 (four) hours as needed. For wheezing 1 Inhaler 2  . [DISCONTINUED] albuterol (PROVENTIL) (2.5 MG/3ML) 0.083% nebulizer solution Take 3 mLs (2.5 mg total) by nebulization every 6 (six) hours as needed for wheezing or shortness of breath. 150 mL 1    No results  found for this or any previous visit (from the past 48 hour(s)). No results found.  Review Of Systems Constitutional: No fever, chills, weight loss or gain. Eyes: No vision change, wears glasses. No discharge or pain. Ears: No hearing loss, No tinnitus. Respiratory: No asthma, positive COPD, pneumonias. Positive shortness of breath. No hemoptysis. Cardiovascular: Positive chest pain, palpitation, leg edema. Gastrointestinal: No nausea, vomiting, diarrhea, constipation. No GI bleed. No hepatitis. Genitourinary: No dysuria, hematuria, kidney stone. No incontinance. Neurological: No headache, stroke, seizures.  Psychiatry: Positive psych facility admission for anxiety, depression, suicide.  No detox. Skin: No rash. Musculoskeletal: Positive joint pain, fibromyalgia. No neck pain, back pain. Lymphadenopathy: No lymphadenopathy. Hematology: No anemia or easy bruising.   Blood pressure 121/75. There is no height or weight on file to calculate BMI. General appearance: alert, cooperative, appears stated age and mild distress Head: Normocephalic, atraumatic. Eyes: Brown eyes, pink conjunctiva, corneas clear. PERRL, EOM's intact. Neck: No adenopathy, no carotid bruit, no JVD, supple, symmetrical, trachea midline and thyroid not enlarged. Resp: Rhonchi to auscultation bilaterally. Cardio: Regular rate and rhythm, S1, S2 normal, II/VI systolic murmur, no click, rub or gallop GI: Soft, non-tender; bowel sounds normal; no organomegaly. Extremities: No edema, cyanosis or clubbing. Skin: Warm and dry.  Neurologic: Alert and oriented X 3, normal strength. Normal coordination and gait.  Assessment/Plan Atrial flutter with RVR COPD Bipolar disorder Hypertension Hyperlipidemia Hypothyroidism Tobacco use disorder  Admit. IV diltiazem Echocardiogram.  Birdie Riddle, MD  07/10/2017, 7:31 PM

## 2017-07-10 NOTE — Telephone Encounter (Signed)
PT wants refill on hydrocodone.  She was seen by dr 07-05-17.  Her blood pressure is going up since dr took her off her HCTZ.  Please advise  Please call her back on [phone]

## 2017-07-11 ENCOUNTER — Inpatient Hospital Stay (HOSPITAL_COMMUNITY): Payer: Medicare Other

## 2017-07-11 LAB — BASIC METABOLIC PANEL
Anion gap: 15 (ref 5–15)
BUN: 9 mg/dL (ref 6–20)
CO2: 23 mmol/L (ref 22–32)
Calcium: 8.6 mg/dL — ABNORMAL LOW (ref 8.9–10.3)
Chloride: 99 mmol/L — ABNORMAL LOW (ref 101–111)
Creatinine, Ser: 1.01 mg/dL — ABNORMAL HIGH (ref 0.44–1.00)
GFR calc Af Amer: >60 mL/min (ref >60)
GFR calc non Af Amer: 57 mL/min — ABNORMAL LOW (ref >60)
Glucose, Bld: 186 mg/dL — ABNORMAL HIGH (ref 65–99)
Potassium: 3.2 mmol/L — ABNORMAL LOW (ref 3.5–5.1)
Sodium: 137 mmol/L (ref 135–145)

## 2017-07-11 LAB — GLUCOSE, CAPILLARY
Glucose-Capillary: 118 mg/dL — ABNORMAL HIGH (ref 65–99)
Glucose-Capillary: 234 mg/dL — ABNORMAL HIGH (ref 65–99)
Glucose-Capillary: 210 mg/dL — ABNORMAL HIGH (ref 65–99)

## 2017-07-11 LAB — CBC
HCT: 35 % — ABNORMAL LOW (ref 36.0–46.0)
Hemoglobin: 11.6 g/dL — ABNORMAL LOW (ref 12.0–15.0)
MCH: 34.8 pg — ABNORMAL HIGH (ref 26.0–34.0)
MCHC: 33.1 g/dL (ref 30.0–36.0)
MCV: 105.1 fL — ABNORMAL HIGH (ref 78.0–100.0)
Platelets: 140 10*3/uL — ABNORMAL LOW (ref 150–400)
RBC: 3.33 MIL/uL — ABNORMAL LOW (ref 3.87–5.11)
RDW: 15.1 % (ref 11.5–15.5)
WBC: 4.9 10*3/uL (ref 4.0–10.5)

## 2017-07-11 LAB — ECHOCARDIOGRAM COMPLETE
Height: 63 in
Weight: 2745.6 oz

## 2017-07-11 MED ORDER — LEVALBUTEROL HCL 0.63 MG/3ML IN NEBU
0.6300 mg | INHALATION_SOLUTION | Freq: Four times a day (QID) | RESPIRATORY_TRACT | Status: DC
  Administered 2017-07-11 – 2017-07-12 (×5): 0.63 mg via RESPIRATORY_TRACT
  Filled 2017-07-11 (×5): qty 3

## 2017-07-11 MED ORDER — DEXTROSE 5 % IV SOLN
500.0000 mg | INTRAVENOUS | Status: DC
  Administered 2017-07-11: 500 mg via INTRAVENOUS
  Filled 2017-07-11 (×2): qty 500

## 2017-07-11 MED ORDER — LEVALBUTEROL HCL 0.63 MG/3ML IN NEBU
0.6300 mg | INHALATION_SOLUTION | Freq: Four times a day (QID) | RESPIRATORY_TRACT | Status: DC
  Filled 2017-07-11: qty 3

## 2017-07-11 MED ORDER — METHYLPREDNISOLONE SODIUM SUCC 125 MG IJ SOLR
60.0000 mg | Freq: Every day | INTRAMUSCULAR | Status: DC
  Administered 2017-07-11: 60 mg via INTRAVENOUS
  Filled 2017-07-11: qty 2

## 2017-07-11 MED ORDER — INSULIN ASPART 100 UNIT/ML ~~LOC~~ SOLN: 0.0000 [IU] | Freq: Three times a day (TID) | SUBCUTANEOUS | Status: DC

## 2017-07-11 MED ORDER — INSULIN ASPART 100 UNIT/ML ~~LOC~~ SOLN: 3.0000 [IU] | Freq: Three times a day (TID) | SUBCUTANEOUS | Status: DC

## 2017-07-11 MED ORDER — LINAGLIPTIN 5 MG PO TABS
5.0000 mg | ORAL_TABLET | Freq: Every day | ORAL | Status: DC
  Administered 2017-07-11 – 2017-07-12 (×2): 5 mg via ORAL
  Filled 2017-07-11 (×2): qty 1

## 2017-07-11 NOTE — Progress Notes (Signed)
Pt refusing to take her pravastatin per pt she takes a cholesterol pill that's combined with a reflux medicine. RN requested a list of the pt's meds. Pt said she would ask her dgtr cassandra to bring a list in either tonight or tomorrow. Hoover Brunette, RN

## 2017-07-11 NOTE — Plan of Care (Signed)
Pt oriented to unit, plan of care, and method of reporting concerns. Patient verbalizes understanding of education. Bed alarm implemented for safety. Pt resting comfortably with call light within reach.

## 2017-07-11 NOTE — Progress Notes (Signed)
I came to give the patient her breathing treatment & she does not want at this time. RN notified that I attempted to give nebulizer. The patient is audibly wheezing & has increased WOB.

## 2017-07-11 NOTE — Progress Notes (Signed)
2D Echocardiogram has been performed.  [name] Long 07/11/2017, 11:46 AM

## 2017-07-11 NOTE — Telephone Encounter (Signed)
Discussed with patient that she will not be getting refills of narcotics. She is currently admitted to the hospital. At my last visit I wanted patient to follow up for blood pressure check due to low SBP. At that time I had recommended she hold HCTZ until follow up. Currently admitted for unrelated problem. Can discuss this issue at her follow up after d/c.  Daniel L. Rosalyn Gess, [place] Resident PGY-2 07/11/2017 1:17 PM    FYI white team

## 2017-07-11 NOTE — Plan of Care (Signed)
Pt up ad lib. Ambulates independently with ease. Educated on use of call light system. Verbalizes understanding of need to call for assistance prior to ambulation when necessary. 

## 2017-07-11 NOTE — Progress Notes (Signed)
Ref: Eloise Levels, MD   Subjective:  Shortness of breath and wheezing at times. Heart rate improving with diltiazem drip.   Objective:  Vital Signs in the last 24 hours: Temp:  [98.3 F (36.8 C)-98.7 F (37.1 C)] 98.7 F (37.1 C) (01/08 0500) Pulse Rate:  [83-95] 83 (01/08 0500) Cardiac Rhythm: Normal sinus rhythm;Heart block (01/08 0700) BP: (105-139)/(75-92) 139/78 (01/08 0500) SpO2:  [93 %-97 %] 93 % (01/08 0500) Weight:  [77.8 kg (171 lb 9.6 oz)] 77.8 kg (171 lb 9.6 oz) (01/08 0500)  Physical Exam: BP Readings from Last 1 Encounters:  07/11/17 139/78     Wt Readings from Last 1 Encounters:  07/11/17 77.8 kg (171 lb 9.6 oz)    Weight change:  Body mass index is 30.4 kg/m. HEENT: [place]/AT, Eyes-Brown, PERL, EOMI, Conjunctiva-Pink, Sclera-Non-icteric Neck: No JVD, No bruit, Trachea midline. Lungs:  Wheezing, Bilateral. Cardiac:  Irregular rhythm, normal S1 and S2, no S3. II/VI systolic murmur. Abdomen:  Soft, non-tender. BS present. Extremities:  No edema present. No cyanosis. No clubbing. CNS: AxOx3, Cranial nerves grossly intact, moves all 4 extremities.  Skin: Warm and dry.   Intake/Output from previous day: 01/07 0701 - 01/08 0700 In: 406.3 [P.O.:360; I.V.:46.3] Out: -     Lab Results: BMET    Component Value Date/Time   NA 137 07/11/2017 0546   NA 137 07/10/2017 2043   NA 133 (L) 12/26/2016 0718   K 3.2 (L) 07/11/2017 0546   K 3.3 (L) 07/10/2017 2043   K 3.6 12/26/2016 0718   CL 99 (L) 07/11/2017 0546   CL 100 (L) 07/10/2017 2043   CL 93 (L) 12/26/2016 0718   CO2 23 07/11/2017 0546   CO2 25 07/10/2017 2043   CO2 30 12/26/2016 0718   GLUCOSE 186 (H) 07/11/2017 0546   GLUCOSE 167 (H) 07/10/2017 2043   GLUCOSE 337 (H) 12/26/2016 0718   BUN 9 07/11/2017 0546   BUN 9 07/10/2017 2043   BUN 17 12/26/2016 0718   CREATININE 1.01 (H) 07/11/2017 0546   CREATININE 0.80 07/10/2017 2043   CREATININE 1.13 (H) 12/26/2016 0718   CREATININE 0.76 02/02/2016  1606   CREATININE 1.07 (H) 09/29/2015 1451   CREATININE 0.81 05/06/2015 1437   CALCIUM 8.6 (L) 07/11/2017 0546   CALCIUM 8.4 (L) 07/10/2017 2043   CALCIUM 8.9 12/26/2016 0718   GFRNONAA 57 (L) 07/11/2017 0546   GFRNONAA >60 07/10/2017 2043   GFRNONAA 50 (L) 12/26/2016 0718   GFRNONAA 83 02/02/2016 1606   GFRNONAA 55 (L) 09/29/2015 1451   GFRNONAA 78 05/06/2015 1437   GFRAA >60 07/11/2017 0546   GFRAA >60 07/10/2017 2043   GFRAA 58 (L) 12/26/2016 0718   GFRAA >89 02/02/2016 1606   GFRAA 63 09/29/2015 1451   GFRAA 89 05/06/2015 1437   CBC    Component Value Date/Time   WBC 4.9 07/11/2017 0546   RBC 3.33 (L) 07/11/2017 0546   HGB 11.6 (L) 07/11/2017 0546   HCT 35.0 (L) 07/11/2017 0546   PLT 140 (L) 07/11/2017 0546   MCV 105.1 (H) 07/11/2017 0546   MCH 34.8 (H) 07/11/2017 0546   MCHC 33.1 07/11/2017 0546   RDW 15.1 07/11/2017 0546   LYMPHSABS 3.6 07/10/2017 2043   MONOABS 0.5 07/10/2017 2043   EOSABS 0.1 07/10/2017 2043   BASOSABS 0.0 07/10/2017 2043   HEPATIC Function Panel Recent Labs    10/30/16 2042 12/26/16 0718 07/10/17 2043  PROT 6.7 7.1 6.3*   HEMOGLOBIN A1C No components found  for: HGA1C,  MPG CARDIAC ENZYMES Lab Results  Component Value Date   TROPONINI <0.03 12/26/2016   TROPONINI <0.03 12/25/2016   BNP No results for input(s): PROBNP in the last 8760 hours. TSH Recent Labs    12/25/16 1222 07/10/17 2043  TSH 0.354 1.416   CHOLESTEROL Recent Labs    04/26/17 1614  CHOL 211*    Scheduled Meds: . apixaban  5 mg Oral BID  . divalproex  1,000 mg Oral 2 times per day  . levalbuterol  2 puff Inhalation Q6H  . levothyroxine  50 mcg Oral QAC breakfast  . LORazepam  0.5 mg Oral BID  . metFORMIN  1,000 mg Oral Q breakfast  . nicotine  14 mg Transdermal Daily  . olopatadine  1 drop Both Eyes BID  . pantoprazole  40 mg Oral Daily  . pravastatin  40 mg Oral QPM  . risperiDONE  1 mg Oral QHS   Continuous Infusions: . diltiazem (CARDIZEM)  infusion 5 mg/hr (07/11/17 0549)   PRN Meds:.albuterol, nitroGLYCERIN  Assessment/Plan: Atrial flutter with controlled ventricular response COPD, acute exacerbation Bipolar disorder Hypertension Hyperlipidemia Hypothyroidism Type II DM Tobacco use disorder  Add azithromycin and solumedrol Awaiting echocardiogram.   LOS: 1 day    Dixie Dials  MD  07/11/2017, 10:09 AM

## 2017-07-11 NOTE — Progress Notes (Signed)
Pt noted to be wheezing a lot upon ambulation. Prn neb treatment given. Pt tolerated well. Will continue to monitor the pt. Hoover Brunette

## 2017-07-12 ENCOUNTER — Encounter (HOSPITAL_COMMUNITY): Payer: Self-pay | Admitting: General Practice

## 2017-07-12 LAB — GLUCOSE, CAPILLARY
Glucose-Capillary: 244 mg/dL — ABNORMAL HIGH (ref 65–99)
Glucose-Capillary: 210 mg/dL — ABNORMAL HIGH (ref 65–99)
Glucose-Capillary: 174 mg/dL — ABNORMAL HIGH (ref 65–99)
Glucose-Capillary: 84 mg/dL (ref 65–99)

## 2017-07-12 MED ORDER — GLIMEPIRIDE 1 MG PO TABS
2.0000 mg | ORAL_TABLET | Freq: Every day | ORAL | Status: DC
  Administered 2017-07-12: 2 mg via ORAL
  Filled 2017-07-12: qty 2

## 2017-07-12 MED ORDER — METFORMIN HCL 500 MG PO TABS
1000.0000 mg | ORAL_TABLET | Freq: Two times a day (BID) | ORAL | Status: DC
  Filled 2017-07-12: qty 2

## 2017-07-12 MED ORDER — LINAGLIPTIN 5 MG PO TABS: 5.0000 mg | ORAL_TABLET | Freq: Every day | ORAL | 1 refills | Status: DC

## 2017-07-12 MED ORDER — PREDNISONE 10 MG PO TABS: 10.0000 mg | ORAL_TABLET | Freq: Every day | ORAL | 0 refills | Status: DC

## 2017-07-12 MED ORDER — PREDNISONE 20 MG PO TABS
20.0000 mg | ORAL_TABLET | Freq: Every day | ORAL | Status: DC
Start: 2017-07-12 — End: 2017-07-12
  Administered 2017-07-12: 20 mg via ORAL
  Filled 2017-07-12: qty 1

## 2017-07-12 MED ORDER — DILTIAZEM HCL ER COATED BEADS 240 MG PO CP24: 240.0000 mg | ORAL_CAPSULE | Freq: Every day | ORAL | 3 refills | Status: DC

## 2017-07-12 MED ORDER — DILTIAZEM HCL ER COATED BEADS 240 MG PO CP24
240.0000 mg | ORAL_CAPSULE | Freq: Every day | ORAL | Status: DC
  Administered 2017-07-12: 240 mg via ORAL
  Filled 2017-07-12: qty 1

## 2017-07-12 MED ORDER — AZITHROMYCIN 250 MG PO TABS
500.0000 mg | ORAL_TABLET | Freq: Every day | ORAL | Status: DC
  Administered 2017-07-12: 500 mg via ORAL
  Filled 2017-07-12: qty 2

## 2017-07-12 MED ORDER — METFORMIN HCL 1000 MG PO TABS: ORAL_TABLET | ORAL | 1 refills | Status: DC

## 2017-07-12 MED ORDER — AZITHROMYCIN 250 MG PO TABS: ORAL_TABLET | ORAL | 0 refills | Status: DC

## 2017-07-12 MED ORDER — NICOTINE 14 MG/24HR TD PT24: 14.0000 mg | MEDICATED_PATCH | Freq: Every day | TRANSDERMAL | 0 refills | Status: DC

## 2017-07-12 MED ORDER — APIXABAN 5 MG PO TABS: 5.0000 mg | ORAL_TABLET | Freq: Two times a day (BID) | ORAL | 3 refills | Status: DC

## 2017-07-12 NOTE — Progress Notes (Signed)
Inpatient Diabetes Program Recommendations  AACE/ADA: New Consensus Statement on Inpatient Glycemic Control (2015)  Target Ranges:  Prepandial:   less than 140 mg/dL      Peak postprandial:   less than 180 mg/dL (1-2 hours)      Critically ill patients:  140 - 180 mg/dL   Lab Results  Component Value Date   GLUCAP 174 (H) 07/12/2017   HGBA1C 8.1 (H) 07/10/2017    Review of Glycemic Control Inpatient Diabetes Program Recommendations:   Received consult. Spoke with patient @ bedside.Spoke with pt  A1C results 8.1 (improved from December 2018 8.7) and explained what an A1C is, basic pathophysiology of DM Type 2, basic home care, basic diabetes diet nutrition principles, importance of checking CBGs and maintaining good CBG control to prevent long-term and short-term complications. Reviewed signs and symptoms of hyperglycemia and hypoglycemia and how to treat hypoglycemia at home. Also reviewed blood sugar goals at home.  RNs to provide ongoing basic DM education at bedside with this patient.   Thank you, Nani Gasser. Hanks, RN, MSN, CDE  Diabetes Coordinator Inpatient Glycemic Control Team Team Pager [phone] (8am-5pm) 07/12/2017 2:22 PM

## 2017-07-12 NOTE — Discharge Summary (Signed)
Physician Discharge Summary  Patient ID: [name] Long MRN: [id number] DOB/AGE: [birth year] 67 y.o.  Admit date: 07/10/2017 Discharge date: 07/12/2017  Admission Diagnoses: Atrial flutter with RVR COPD Bipolar disorder Hypertension Hyperlipidemia Hypothyroidism Tobacco use disorder  Discharge Diagnoses:  Principal Problem: Atrial flutter with rapid ventricular response  Active Problems:   Acute exacerbation of COPD    hypothyroidism   Type 2 diabetes mellitus (HCC)   Tobacco use disorder   HYPERTENSION, BENIGN SYSTEMIC   Hyperlipidemia   Obesity   Bipolar disorder   Hypokalemia  Discharged Condition: fair  Hospital Course: 67 year old female with bipolar disorder, COPD, type 2 diabetes mellitus, hypertension and hypothyroidism at 1 day history of palpitation and shortness of breath in office EKG showed atrial flutter with 2-1 conduction and ventricular rate of 150 bpm. Patient was treated with IV Cardizem drip. She converted to sinus rhythm and 24 hours.  She developed cough with wheezing as acute exacerbation of COPD. Azithromycin and Solu-Medrol were used with a partial success.  Patient insisted on going home hence her medications were adjusted and she was discharged home in satisfactory condition with follow-up by me in 1 week and by primary care physician in 1 month. Nicotine patch was applied to assist with smoking cessation and potassium 10 meq. daily prescription was given at time of discharge.  Consults: cardiology  Significant Diagnostic Studies: labs: Normal WBC count and Hemoglobin. Elevated MCV and borderline low platelets count. Potassium was 3.3 meq, blood sugar was 167 to 244 mg. and down to 84 mg. at time of discharge. Normal TSH. Hgb A1C of 8.1.  EKG-Atrial flutter with variable AV block.  Chest x-ray: No active disease with aortic atherosclerosis.  Echocardiogram: Normal LV systolic function, mild MR, TR and mildly dilated LA and RA.  Treatments: cardiac  meds: diltiazem and Apixaban.  Discharge Exam: Blood pressure 124/66, pulse 83, temperature 98.3 F (36.8 C), temperature source Oral, resp. rate 18, height _0  (1.6 m), weight 78.2 kg (172 lb 4.8 oz), SpO2 98 %. General appearance: alert, cooperative and appears stated age. Head: Normocephalic, atraumatic. Eyes: Brown eyes, pink conjunctiva, corneas clear. PERRL, EOM's intact.  Neck: No adenopathy, no carotid bruit, no JVD, supple, symmetrical, trachea midline and thyroid not enlarged. Resp: Rhonchi to auscultation bilaterally. Cardio: Regular rate and rhythm, S1, S2 normal, II/VI systolic murmur, no click, rub or gallop. GI: Soft, non-tender; bowel sounds normal; no organomegaly. Extremities: No edema, cyanosis or clubbing. Skin: Warm and dry.  Neurologic: Alert and oriented X 3, normal strength and tone. Normal coordination and gait.  Disposition: 01-Home or Self Care   Allergies as of 07/12/2017      Reactions   Citrus Anaphylaxis, Itching   Fish Allergy Anaphylaxis   Cod   Shellfish Allergy Shortness Of Breath, Other (See Comments)   "Affects thyroid" also   Adhesive [tape] Other (See Comments)   Must have paper tape only   Ibuprofen Swelling   Face swells   Lipitor [atorvastatin Calcium] Other (See Comments)   Body aches   Lisinopril Other (See Comments)   PER DR. Melvyn Novas (not recalled by patient)   Other Nausea Only, Other (See Comments)   Collards (gas, too)   Codeine Rash      Medication List    STOP taking these medications   [facility] ASPIRIN 81 MG chewable tablet Generic drug:  aspirin   metroNIDAZOLE 500 MG tablet Commonly known as:  FLAGYL     TAKE these medications   albuterol (2.5  MG/3ML) 0.083% nebulizer solution Commonly known as:  PROVENTIL Take 2.5 mg by nebulization every 6 (six) hours as needed for wheezing or shortness of breath.   PROVENTIL HFA 108 (90 Base) MCG/ACT inhaler Generic drug:  albuterol Inhale 2 puffs into the lungs every  6 (six) hours as needed for wheezing or shortness of breath.   apixaban 5 MG Tabs tablet Commonly known as:  ELIQUIS Take 1 tablet (5 mg total) by mouth 2 (two) times daily.   azithromycin 250 MG tablet Commonly known as:  ZITHROMAX Two daily in AM then one daily x 4 days Start taking on:  07/13/2017   diltiazem 240 MG 24 hr capsule Commonly known as:  CARDIZEM CD Take 1 capsule (240 mg total) by mouth daily. Start taking on:  07/13/2017   divalproex 500 MG DR tablet Commonly known as:  DEPAKOTE Take 2 tablets (1,000 mg total) by mouth 2 (two) times daily.   fluticasone 110 MCG/ACT inhaler Commonly known as:  FLOVENT HFA Inhale 1 puff into the lungs 2 (two) times daily.   fluticasone 50 MCG/ACT nasal spray Commonly known as:  FLONASE Place 2 sprays into both nostrils daily.   glucose blood test strip Commonly known as:  ONETOUCH VERIO Use as instructed to test blood sugar up to 4 times daily. ICD-10 code: E11.9.   levothyroxine 50 MCG tablet Commonly known as:  SYNTHROID Take 1 tablet (50 mcg total) by mouth daily before breakfast.   linagliptin 5 MG Tabs tablet Commonly known as:  TRADJENTA Take 1 tablet (5 mg total) by mouth daily. Start taking on:  07/13/2017   LORazepam 1 MG tablet Commonly known as:  ATIVAN Take 1 mg by mouth 2 (two) times daily at 8 am and 10 pm.   metFORMIN 1000 MG tablet Commonly known as:  GLUCOPHAGE take 1 tablets by mouth twice daily with food. What changed:    medication strength  additional instructions   nicotine 14 mg/24hr patch Commonly known as:  NICODERM CQ - dosed in mg/24 hours Place 1 patch (14 mg total) onto the skin daily. Start taking on:  07/13/2017   olopatadine 0.1 % ophthalmic solution Commonly known as:  PATANOL Place 1 drop into both eyes 2 (two) times daily. What changed:    when to take this  reasons to take this   omeprazole 20 MG capsule Commonly known as:  PRILOSEC Take 1 capsule (20 mg total) by  mouth daily.   ONETOUCH DELICA LANCETS FINE Misc 1 each by Does not apply route 4 (four) times daily. ICD-10 Code: E11.9   ONETOUCH VERIO w/Device Kit 1 kit by Does not apply route 4 (four) times daily. ICD-10 Code: E11.9.   pravastatin 40 MG tablet Commonly known as:  PRAVACHOL take 1 tablet by mouth every evening What changed:    how much to take  how to take this  when to take this   predniSONE 10 MG tablet Commonly known as:  DELTASONE Take 1 tablet (10 mg total) by mouth daily before breakfast. For 4 days then 1/2 tablet daily x 8 days Start taking on:  07/13/2017   risperiDONE 1 MG tablet Commonly known as:  RISPERDAL Take 1 tablet (1 mg total) by mouth 2 (two) times daily.   trihexyphenidyl 5 MG tablet Commonly known as:  ARTANE Take 1 tablet (5 mg total) by mouth 2 (two) times daily with a meal.      Follow-up Information    Eloise Levels, MD. Schedule  an appointment as soon as possible for a visit in 1 month(s).   Specialty:  Family Medicine Contact information: [place] [address] [phone]        Dixie Dials, MD. Schedule an appointment as soon as possible for a visit in 1 week(s).   Specialty:  Cardiology Contact information: [place] [address] [phone]           Signed: Birdie Riddle 07/12/2017, 6:52 PM

## 2017-07-12 NOTE — Progress Notes (Signed)
Discharge summary reviewed with pt and son, Jacqulynn Cadet, at bedside. Daughters Engineering geologist and [name] also updated via telephone. All questions addressed and pt/son express no concerns at this time. No change since previous assessment. PIV removed. Pt discharged to lobby with son via wheelchair and nurse tech.

## 2017-07-12 NOTE — Discharge Instructions (Addendum)
Coping with Quitting Smoking Quitting smoking is a physical and mental challenge. You will face cravings, withdrawal symptoms, and temptation. Before quitting, work with your health care provider to make a plan that can help you cope. Preparation can help you quit and keep you from giving in. How can I cope with cravings? Cravings usually last for 5-10 minutes. If you get through it, the craving will pass. Consider taking the following actions to help you cope with cravings:  Keep your mouth busy: ? Chew sugar-free gum. ? Suck on hard candies or a straw. ? Brush your teeth.  Keep your hands and body busy: ? Immediately change to a different activity when you feel a craving. ? Squeeze or play with a ball. ? Do an activity or a hobby, like making bead jewelry, practicing needlepoint, or working with wood. ? Mix up your normal routine. ? Take a short exercise break. Go for a quick walk or run up and down stairs. ? Spend time in public places where smoking is not allowed.  Focus on doing something kind or helpful for someone else.  Call a friend or family member to talk during a craving.  Join a support group.  Call a quit line, such as 1-800-QUIT-NOW.  Talk with your health care provider about medicines that might help you cope with cravings and make quitting easier for you.  How can I deal with withdrawal symptoms? Your body may experience negative effects as it tries to get used to not having nicotine in the system. These effects are called withdrawal symptoms. They may include:  Feeling hungrier than normal.  Trouble concentrating.  Irritability.  Trouble sleeping.  Feeling depressed.  Restlessness and agitation.  Craving a cigarette.  To manage withdrawal symptoms:  Avoid places, people, and activities that trigger your cravings.  Remember why you want to quit.  Get plenty of sleep.  Avoid coffee and other caffeinated drinks. These may worsen some of your  symptoms.  How can I handle social situations? Social situations can be difficult when you are quitting smoking, especially in the first few weeks. To manage this, you can:  Avoid parties, bars, and other social situations where people might be smoking.  Avoid alcohol.  Leave right away if you have the urge to smoke.  Explain to your family and friends that you are quitting smoking. Ask for understanding and support.  Plan activities with friends or family where smoking is not an option.  What are some ways I can cope with stress? Wanting to smoke may cause stress, and stress can make you want to smoke. Find ways to manage your stress. Relaxation techniques can help. For example:  Breathe slowly and deeply, in through your nose and out through your mouth.  Listen to soothing, relaxing music.  Talk with a family member or friend about your stress.  Light a candle.  Soak in a bath or take a shower.  Think about a peaceful place.  What are some ways I can prevent weight gain? Be aware that many people gain weight after they quit smoking. However, not everyone does. To keep from gaining weight, have a plan in place before you quit and stick to the plan after you quit. Your plan should include:  Having healthy snacks. When you have a craving, it may help to: ? Eat plain popcorn, crunchy carrots, celery, or other cut vegetables. ? Chew sugar-free gum.  Changing how you eat: ? Eat small portion sizes at meals. ?  Eat 4-6 small meals throughout the day instead of 1-2 large meals a day. ? Be mindful when you eat. Do not watch television or do other things that might distract you as you eat.  Exercising regularly: ? Make time to exercise each day. If you do not have time for a long workout, do short bouts of exercise for 5-10 minutes several times a day. ? Do some form of strengthening exercise, like weight lifting, and some form of aerobic exercise, like running or  swimming.  Drinking plenty of water or other low-calorie or no-calorie drinks. Drink 6-8 glasses of water daily, or as much as instructed by your health care provider.  Summary  Quitting smoking is a physical and mental challenge. You will face cravings, withdrawal symptoms, and temptation to smoke again. Preparation can help you as you go through these challenges.  You can cope with cravings by keeping your mouth busy (such as by chewing gum), keeping your body and hands busy, and making calls to family, friends, or a helpline for people who want to quit smoking.  You can cope with withdrawal symptoms by avoiding places where people smoke, avoiding drinks with caffeine, and getting plenty of rest.  Ask your health care provider about the different ways to prevent weight gain, avoid stress, and handle social situations. This information is not intended to replace advice given to you by your health care provider. Make sure you discuss any questions you have with your health care provider. Document Released: 06/17/2016 Document Revised: 06/17/2016 Document Reviewed: 06/17/2016 Elsevier Interactive Patient Education  2018 [employer]. Atrial Fibrillation Atrial fibrillation is a type of heartbeat that is irregular or fast (rapid). If you have this condition, your heart keeps quivering in a weird (chaotic) way. This condition can make it so your heart cannot pump blood normally. Having this condition gives a person more risk for stroke, heart failure, and other heart problems. There are different types of atrial fibrillation. Talk with your doctor to learn about the type that you have. Follow these instructions at home:  Take over-the-counter and prescription medicines only as told by your doctor.  If your doctor prescribed a blood-thinning medicine, take it exactly as told. Taking too much of it can cause bleeding. If you do not take enough of it, you will not have the protection that you need  against stroke and other problems.  Do not use any tobacco products. These include cigarettes, chewing tobacco, and e-cigarettes. If you need help quitting, ask your doctor.  If you have apnea (obstructive sleep apnea), manage it as told by your doctor.  Do not drink alcohol.  Do not drink beverages that have caffeine. These include coffee, soda, and tea.  Maintain a healthy weight. Do not use diet pills unless your doctor says they are safe for you. Diet pills may make heart problems worse.  Follow diet instructions as told by your doctor.  Exercise regularly as told by your doctor.  Keep all follow-up visits as told by your doctor. This is important. Contact a doctor if:  You notice a change in the speed, rhythm, or strength of your heartbeat.  You are taking a blood-thinning medicine and you notice more bruising.  You get tired more easily when you move or exercise. Get help right away if:  You have pain in your chest or your belly (abdomen).  You have sweating or weakness.  You feel sick to your stomach (nauseous).  You notice blood in your throw  up (vomit), poop (stool), or pee (urine).  You are short of breath.  You suddenly have swollen feet and ankles.  You feel dizzy.  Your suddenly get weak or numb in your face, arms, or legs, especially if it happens on one side of your body.  You have trouble talking, trouble understanding, or both.  Your face or your eyelid droops on one side. These symptoms may be an emergency. Do not wait to see if the symptoms will go away. Get medical help right away. Call your local emergency services (911 in the [place] Do not drive yourself to the hospital. This information is not intended to replace advice given to you by your health care provider. Make sure you discuss any questions you have with your health care provider. Document Released: 03/29/2008 Document Revised: 11/26/2015 Document Reviewed: 10/15/2014 Elsevier Interactive  Patient Education  2018 [place] [employer] on my medicine - ELIQUIS (apixaban)  Why was Eliquis prescribed for you? Eliquis was prescribed for you to reduce the risk of a blood clot forming that can cause a stroke if you have a medical condition called atrial fibrillation (a type of irregular heartbeat).  What do You need to know about Eliquis ? Take your Eliquis TWICE DAILY - one tablet in the morning and one tablet in the evening with or without food. If you have difficulty swallowing the tablet whole please discuss with your pharmacist how to take the medication safely.  Take Eliquis exactly as prescribed by your doctor and DO NOT stop taking Eliquis without talking to the doctor who prescribed the medication.  Stopping may increase your risk of developing a stroke.  Refill your prescription before you run out.  After discharge, you should have regular check-up appointments with your healthcare provider that is prescribing your Eliquis.  In the future your dose may need to be changed if your kidney function or weight changes by a significant amount or as you get older.  What do you do if you miss a dose? If you miss a dose, take it as soon as you remember on the same day and resume taking twice daily.  Do not take more than one dose of ELIQUIS at the same time to make up a missed dose.  Important Safety Information A possible side effect of Eliquis is bleeding. You should call your healthcare provider right away if you experience any of the following: ? Bleeding from an injury or your nose that does not stop. ? Unusual colored urine (red or dark brown) or unusual colored stools (red or black). ? Unusual bruising for unknown reasons. ? A serious fall or if you hit your head (even if there is no bleeding).  Some medicines may interact with Eliquis and might increase your risk of bleeding or clotting while on Eliquis. To help avoid this, consult your healthcare provider or  pharmacist prior to using any new prescription or non-prescription medications, including herbals, vitamins, non-steroidal anti-inflammatory drugs (NSAIDs) and supplements.  This website has more information on Eliquis (apixaban): http://www.eliquis.com/eliquis/home

## 2017-07-12 NOTE — Care Management Note (Signed)
Case Management Note  Patient Details  Name: RICKIYA PICARIELLO MRN: [id number] Date of Birth: Apr 15, 1951  Subjective/Objective: Pt presented for New Onset Atrial Fib. Plan for home on Eliquis. Benefits Check Completed.                    Action/Plan: Pt has Medicare/ Medicaid and co pay should be no more than $3.00. [facility] on [address] has medications available. CM did discuss if she needs [facility] for Medication management and pt declines the need for RN Services. Pt has transportation home and son to pick pt up today. No further needs from CM at this time.   Expected Discharge Date:                  Expected Discharge Plan:  Home/Self Care  In-House Referral:  NA  Discharge planning Services  CM Consult, Medication Assistance  Post Acute Care Choice:  NA Choice offered to:  NA  DME Arranged:  N/A DME Agency:  NA  HH Arranged:  NA HH Agency:  NA  Status of Service:  Completed, signed off  If discussed at [place] of Stay Meetings, dates discussed:    Additional Comments:  Bethena Roys, RN 07/12/2017, 12:11 PM

## 2017-07-12 NOTE — Progress Notes (Signed)
Ref: Eloise Levels, MD   Subjective:  Respiratory distress continues. Cough is improving. T max 99.1 Patient wants to go home. Blood sugars are elevated due to solumedrol use. Monitor is sinus rhythm with frequent APCs.  Objective:  Vital Signs in the last 24 hours: Temp:  [97.7 F (36.5 C)-99.1 F (37.3 C)] 97.7 F (36.5 C) (01/09 0523) Pulse Rate:  [80-99] 80 (01/09 0523) Cardiac Rhythm: Atrial fibrillation (01/09 0700) Resp:  [20] 20 (01/08 1435) BP: (105-136)/(67-85) 119/77 (01/09 0830) SpO2:  [93 %-97 %] 95 % (01/09 0922) Weight:  [78.2 kg (172 lb 4.8 oz)] 78.2 kg (172 lb 4.8 oz) (01/09 0523)  Physical Exam: BP Readings from Last 1 Encounters:  07/12/17 119/77     Wt Readings from Last 1 Encounters:  07/12/17 78.2 kg (172 lb 4.8 oz)    Weight change: 0.318 kg (11.2 oz) Body mass index is 30.52 kg/m. HEENT: [place]/AT, Eyes-Brown, PERL, EOMI, Conjunctiva-Pink, Sclera-Non-icteric Neck: No JVD, No bruit, Trachea midline. Lungs:  Rhonchi, Bilateral. Cardiac:  Irregular rhythm, normal S1 and S2, no S3. II/VI systolic murmur. Abdomen:  Soft, non-tender. BS present. Extremities:  No edema present. No cyanosis. No clubbing. CNS: AxOx3, Cranial nerves grossly intact, moves all 4 extremities.  Skin: Warm and dry.   Intake/Output from previous day: 01/08 0701 - 01/09 0700 In: 1726.9 [P.O.:1500; I.V.:226.9] Out: -     Lab Results: BMET    Component Value Date/Time   NA 137 07/11/2017 0546   NA 137 07/10/2017 2043   NA 133 (L) 12/26/2016 0718   K 3.2 (L) 07/11/2017 0546   K 3.3 (L) 07/10/2017 2043   K 3.6 12/26/2016 0718   CL 99 (L) 07/11/2017 0546   CL 100 (L) 07/10/2017 2043   CL 93 (L) 12/26/2016 0718   CO2 23 07/11/2017 0546   CO2 25 07/10/2017 2043   CO2 30 12/26/2016 0718   GLUCOSE 186 (H) 07/11/2017 0546   GLUCOSE 167 (H) 07/10/2017 2043   GLUCOSE 337 (H) 12/26/2016 0718   BUN 9 07/11/2017 0546   BUN 9 07/10/2017 2043   BUN 17 12/26/2016 0718   CREATININE 1.01 (H) 07/11/2017 0546   CREATININE 0.80 07/10/2017 2043   CREATININE 1.13 (H) 12/26/2016 0718   CREATININE 0.76 02/02/2016 1606   CREATININE 1.07 (H) 09/29/2015 1451   CREATININE 0.81 05/06/2015 1437   CALCIUM 8.6 (L) 07/11/2017 0546   CALCIUM 8.4 (L) 07/10/2017 2043   CALCIUM 8.9 12/26/2016 0718   GFRNONAA 57 (L) 07/11/2017 0546   GFRNONAA >60 07/10/2017 2043   GFRNONAA 50 (L) 12/26/2016 0718   GFRNONAA 83 02/02/2016 1606   GFRNONAA 55 (L) 09/29/2015 1451   GFRNONAA 78 05/06/2015 1437   GFRAA >60 07/11/2017 0546   GFRAA >60 07/10/2017 2043   GFRAA 58 (L) 12/26/2016 0718   GFRAA >89 02/02/2016 1606   GFRAA 63 09/29/2015 1451   GFRAA 89 05/06/2015 1437   CBC    Component Value Date/Time   WBC 4.9 07/11/2017 0546   RBC 3.33 (L) 07/11/2017 0546   HGB 11.6 (L) 07/11/2017 0546   HCT 35.0 (L) 07/11/2017 0546   PLT 140 (L) 07/11/2017 0546   MCV 105.1 (H) 07/11/2017 0546   MCH 34.8 (H) 07/11/2017 0546   MCHC 33.1 07/11/2017 0546   RDW 15.1 07/11/2017 0546   LYMPHSABS 3.6 07/10/2017 2043   MONOABS 0.5 07/10/2017 2043   EOSABS 0.1 07/10/2017 2043   BASOSABS 0.0 07/10/2017 2043   HEPATIC Function Panel Recent Labs  10/30/16 2042 12/26/16 0718 07/10/17 2043  PROT 6.7 7.1 6.3*   HEMOGLOBIN A1C No components found for: HGA1C,  MPG CARDIAC ENZYMES Lab Results  Component Value Date   TROPONINI <0.03 12/26/2016   TROPONINI <0.03 12/25/2016   BNP No results for input(s): PROBNP in the last 8760 hours. TSH Recent Labs    12/25/16 1222 07/10/17 2043  TSH 0.354 1.416   CHOLESTEROL Recent Labs    04/26/17 1614  CHOL 211*    Scheduled Meds: . apixaban  5 mg Oral BID  . azithromycin  500 mg Oral Daily  . diltiazem  240 mg Oral Daily  . divalproex  1,000 mg Oral 2 times per day  . glimepiride  2 mg Oral Q breakfast  . insulin aspart  0-15 Units Subcutaneous TID WC  . insulin aspart  3 Units Subcutaneous TID WC  . levalbuterol  0.63 mg Inhalation  Q6H  . levothyroxine  50 mcg Oral QAC breakfast  . linagliptin  5 mg Oral Daily  . LORazepam  0.5 mg Oral BID  . metFORMIN  1,000 mg Oral BID WC  . nicotine  14 mg Transdermal Daily  . olopatadine  1 drop Both Eyes BID  . pantoprazole  40 mg Oral Daily  . pravastatin  40 mg Oral QPM  . predniSONE  20 mg Oral QAC breakfast  . risperiDONE  1 mg Oral QHS   Continuous Infusions: PRN Meds:.albuterol, nitroGLYCERIN  Assessment/Plan: Paroxysmal atrial flutter COPD, acute exacerbation Bipolar disease Hypertension Hyperlipidemia Hypothyroidism Type to DM Tobacco use disorder  Increase activity. Change IV diltiazem to PO. Add Glimepiride for sugar control.   LOS: 2 days    Dixie Dials  MD  07/12/2017, 9:48 AM

## 2017-07-13 ENCOUNTER — Telehealth: Payer: Self-pay | Admitting: Family Medicine

## 2017-07-13 NOTE — Telephone Encounter (Signed)
Pt wanted to inform her PCP that she got out of the hospital yesterday for her bronchitis and they put her on some new medications. Pt wanted Rosalyn Gess to look over these new medications they put her on. She has a f/u appointment 1/18.

## 2017-07-14 ENCOUNTER — Telehealth: Payer: Self-pay | Admitting: Family Medicine

## 2017-07-14 NOTE — Telephone Encounter (Signed)
Pt is calling because she needs a refill on her cholesterol medication and needs to be back on her BP medication. She said that the wrong strips and lancets have been called in and we need to send Free Style Lite Strips and Lancets.jw

## 2017-07-17 ENCOUNTER — Telehealth: Payer: Self-pay | Admitting: Family Medicine

## 2017-07-17 NOTE — Telephone Encounter (Signed)
Pt said she has been having to pay for her medications for over a month now because her medicaid wasn't approved. She said that it's been approved now and they said all they are waiting on now it Dr Rosalyn Gess to fax a from back to [place] and she wont have to continue paying for her meds and she needs to get them. Im not sure what form she is talking about from [facility]. Please advise.

## 2017-07-17 NOTE — Telephone Encounter (Signed)
Pt needs refills on pravastation, hctz, hydro codone,  Free style test strips, 2 inhalers.  [facility] on [address].

## 2017-07-18 ENCOUNTER — Other Ambulatory Visit: Payer: Self-pay | Admitting: Family Medicine

## 2017-07-18 DIAGNOSIS — E785 Hyperlipidemia, unspecified: Secondary | ICD-10-CM

## 2017-07-18 MED ORDER — GLUCOSE BLOOD VI STRP: ORAL_STRIP | 12 refills | Status: DC

## 2017-07-18 MED ORDER — PRAVASTATIN SODIUM 40 MG PO TABS: 40.0000 mg | ORAL_TABLET | Freq: Every evening | ORAL | 9 refills | Status: DC

## 2017-07-18 MED ORDER — FREESTYLE LANCETS MISC: 12 refills | Status: DC

## 2017-07-18 MED ORDER — HYDROCHLOROTHIAZIDE 25 MG PO TABS
25.0000 mg | ORAL_TABLET | Freq: Every day | ORAL | 3 refills | Status: DC
Start: 2017-07-18 — End: 2018-04-30

## 2017-07-18 NOTE — Telephone Encounter (Signed)
I'm fairly certain I filled out this paperwork already.  I'll be in the office this afternoon and can confirm. She will not be getting refills of her narcotics.  She knows this.   Dr Rosalyn Gess.

## 2017-07-18 NOTE — Telephone Encounter (Signed)
Pt asking again if Rosalyn Gess has received this paperwork because she wants to get her medications by the end of the day today. She also needs a refill on her albuterol solution, she says she only has 4 left.

## 2017-07-18 NOTE — Telephone Encounter (Signed)
I sent in a prescription for strips I'm not sure why it didn't go through.  Please call the pharmacy and see if there is an issue.  I'm fine with her getting the strips she wants. Also, She has chronic pain that is not being treated with narcotics.  I'm not filling hydrocodone.

## 2017-07-18 NOTE — Telephone Encounter (Signed)
Placed orders for prevastatin, hctz, free style lancets and strips. Will not be refilling hydrocodone. I will sign any paperwork in my inbox this afternoon (1/15).  Daniel L. Rosalyn Gess, [place] Resident PGY-2 07/18/2017 11:08 AM

## 2017-07-18 NOTE — Telephone Encounter (Signed)
Spoke to pt. She has gotten all her refills except the strips. She wants Dr. Rosalyn Gess to know that she is in a lot of pain and needs something to help her. I told her Dr. Rosalyn Gess would not be refilling the hydrocodone and she said that she needs something for pain. Please advise.  [name] Long, CMA

## 2017-07-18 NOTE — Telephone Encounter (Signed)
Pt needs to have the strips called in. We called in the lancets but she needs the strips.

## 2017-07-19 NOTE — Telephone Encounter (Signed)
Spoke to pharmacy they have received the Rx but need the form filled out for medicare. They are faxing over another form today. Please fill out and fax back as soon as possible. Ottis Stain, CMA

## 2017-07-20 DIAGNOSIS — F410 Panic disorder [episodic paroxysmal anxiety] without agoraphobia: Secondary | ICD-10-CM | POA: Diagnosis not present

## 2017-07-20 DIAGNOSIS — I483 Typical atrial flutter: Secondary | ICD-10-CM | POA: Diagnosis not present

## 2017-07-20 DIAGNOSIS — R0602 Shortness of breath: Secondary | ICD-10-CM | POA: Diagnosis not present

## 2017-07-20 DIAGNOSIS — R072 Precordial pain: Secondary | ICD-10-CM | POA: Diagnosis not present

## 2017-07-20 NOTE — Telephone Encounter (Signed)
Pt called to ask if we got this form for Medicare. I told her it is in the doctors box. She asked that it be completed and faxed back by her appointment tomorrow, so she can go get her med's while she's out for her appointment.

## 2017-07-21 ENCOUNTER — Inpatient Hospital Stay: Payer: Self-pay | Admitting: Family Medicine

## 2017-07-21 NOTE — Telephone Encounter (Signed)
This was filled out yesterday morning.   Daniel L. Rosalyn Gess, [facility] Resident PGY-2 07/21/2017 9:27 AM

## 2017-07-23 ENCOUNTER — Telehealth: Payer: Self-pay | Admitting: Family Medicine

## 2017-07-23 NOTE — Telephone Encounter (Signed)
**  After Hours/ Emergency Line Call*  Received a call to report that [name] Long requesting a metformin request to be sent in tonight because she had slept through business hours on Friday. Explained that no refill requests provided through after hours line but offered to send request to PCP vs patient to call back during clinic business hours. Patient upset and insisting on medication refill for metformin urgently tonight. She had no other concerns. Will forward to PCP.  Bufford Lope, DO PGY-2, [facility] 07/23/2017 7:15 PM

## 2017-07-24 ENCOUNTER — Other Ambulatory Visit: Payer: Self-pay | Admitting: Family Medicine

## 2017-07-24 MED ORDER — METFORMIN HCL 1000 MG PO TABS: ORAL_TABLET | ORAL | 1 refills | Status: DC

## 2017-07-24 NOTE — Telephone Encounter (Signed)
Metformin filled. Thanks Dr. Shawna Orleans.  Daniel L. Rosalyn Gess, [facility] Resident PGY-2 07/24/2017 7:56 AM

## 2017-07-31 ENCOUNTER — Telehealth: Payer: Self-pay | Admitting: Family Medicine

## 2017-07-31 DIAGNOSIS — R072 Precordial pain: Secondary | ICD-10-CM | POA: Diagnosis not present

## 2017-07-31 DIAGNOSIS — R0602 Shortness of breath: Secondary | ICD-10-CM | POA: Diagnosis not present

## 2017-07-31 DIAGNOSIS — F410 Panic disorder [episodic paroxysmal anxiety] without agoraphobia: Secondary | ICD-10-CM | POA: Diagnosis not present

## 2017-07-31 DIAGNOSIS — I10 Essential (primary) hypertension: Secondary | ICD-10-CM | POA: Diagnosis not present

## 2017-07-31 NOTE — Telephone Encounter (Signed)
Needs refill on omeprazole.  [facility] on [address]

## 2017-08-01 ENCOUNTER — Telehealth: Payer: Self-pay | Admitting: Family Medicine

## 2017-08-01 NOTE — Telephone Encounter (Signed)
Pt said when she went to her heart dr yesterday she was told by that dr that he was going to be sending [name] Long a fax stating that he believes this pt should be put back on hydrocodone. I didn't see anything in [facility] box for this pt.  Please advise

## 2017-08-02 NOTE — Telephone Encounter (Signed)
While I have not seen any fax and I appreciate the consultant's opinion, but I disagree. Opioids are not standard of care for non-cancer or life-threatening chronic pain. I am happy to refer to a pain medicine specialist. I will not be prescribing any narcotics for chronic pain.     Daniel L. Rosalyn Gess, [facility] Resident PGY-2 08/02/2017 9:28 AM

## 2017-08-08 ENCOUNTER — Other Ambulatory Visit: Payer: Self-pay | Admitting: Family Medicine

## 2017-08-08 ENCOUNTER — Inpatient Hospital Stay: Payer: Self-pay | Admitting: Family Medicine

## 2017-08-08 ENCOUNTER — Telehealth: Payer: Self-pay | Admitting: Family Medicine

## 2017-08-08 MED ORDER — OMEPRAZOLE 20 MG PO CPDR: 20.0000 mg | DELAYED_RELEASE_CAPSULE | Freq: Every day | ORAL | 1 refills | Status: DC

## 2017-08-08 NOTE — Telephone Encounter (Signed)
Pt had to cancel her appt for today. She said she can't get out of the house because she's had diarrhea since she hasn't gotten a refill on her omeprazole. Told pt that was sent to her pharmacy this morning for her. Pt also asking for her hydrocodone, which I do not see on her current med list. She said it is the only thing that helps with her pain and wants to be put back on that, but she didn't know she was ever taken off. Please advise

## 2017-08-10 ENCOUNTER — Telehealth: Payer: Self-pay | Admitting: Family Medicine

## 2017-08-10 NOTE — Telephone Encounter (Signed)
Pt would like nurse to call her about her stomach, she says its still big and hurts.

## 2017-08-22 DIAGNOSIS — R072 Precordial pain: Secondary | ICD-10-CM | POA: Diagnosis not present

## 2017-08-22 DIAGNOSIS — R0602 Shortness of breath: Secondary | ICD-10-CM | POA: Diagnosis not present

## 2017-08-22 DIAGNOSIS — F410 Panic disorder [episodic paroxysmal anxiety] without agoraphobia: Secondary | ICD-10-CM | POA: Diagnosis not present

## 2017-08-22 DIAGNOSIS — I10 Essential (primary) hypertension: Secondary | ICD-10-CM | POA: Diagnosis not present

## 2017-08-30 ENCOUNTER — Inpatient Hospital Stay: Payer: Self-pay | Admitting: Internal Medicine

## 2017-08-30 ENCOUNTER — Inpatient Hospital Stay: Payer: Self-pay | Admitting: Family Medicine

## 2017-09-05 ENCOUNTER — Ambulatory Visit (INDEPENDENT_AMBULATORY_CARE_PROVIDER_SITE_OTHER): Payer: Medicare Other | Admitting: Orthopedic Surgery

## 2017-09-05 ENCOUNTER — Ambulatory Visit (INDEPENDENT_AMBULATORY_CARE_PROVIDER_SITE_OTHER): Payer: Medicare Other

## 2017-09-05 ENCOUNTER — Encounter (INDEPENDENT_AMBULATORY_CARE_PROVIDER_SITE_OTHER): Payer: Self-pay | Admitting: Orthopedic Surgery

## 2017-09-05 VITALS — Ht 63.0 in | Wt 172.0 lb

## 2017-09-05 DIAGNOSIS — M1711 Unilateral primary osteoarthritis, right knee: Secondary | ICD-10-CM

## 2017-09-05 DIAGNOSIS — M7542 Impingement syndrome of left shoulder: Secondary | ICD-10-CM

## 2017-09-05 DIAGNOSIS — M7541 Impingement syndrome of right shoulder: Secondary | ICD-10-CM | POA: Diagnosis not present

## 2017-09-05 MED ORDER — LIDOCAINE HCL 1 % IJ SOLN
5.0000 mL | INTRAMUSCULAR | Status: AC | PRN
  Administered 2017-09-05: 5 mL

## 2017-09-05 MED ORDER — METHYLPREDNISOLONE ACETATE 40 MG/ML IJ SUSP
40.0000 mg | INTRAMUSCULAR | Status: AC | PRN
  Administered 2017-09-05: 40 mg via INTRA_ARTICULAR

## 2017-09-05 NOTE — Progress Notes (Signed)
Office Visit Note   Patient: [name] Long           Date of Birth: 1950/08/11           MRN: [id number] Visit Date: 09/05/2017              Requested by: Eloise Levels, MD [address] [place], [place] [address] PCP: Eloise Levels, MD  Chief Complaint  Patient presents with  . Left Shoulder - Pain  . Right Shoulder - Pain    S/p injection 04/14/17      HPI: Patient is a 67 year old woman with osteoarthritis she states she has arthritis from the tip of her head to the tip of her toes.  She complains of neck pain lower back pain worse symptoms at this time or right knee pain and left shoulder pain.  Patient wears a neoprene brace for her right knee.  Assessment & Plan: Visit Diagnoses:  1. Unilateral primary osteoarthritis, right knee   2. Impingement syndrome of right shoulder   3. Impingement syndrome of left shoulder     Plan: Left shoulder and right knee were injected.  Patient states she would like to follow-up in 4 weeks.  Will evaluate for possible injection of the right shoulder follow-up.  The importance of strengthening for the arthritis was discussed the importance of exercise was discussed.  Follow-Up Instructions: Return in about 4 weeks (around 10/03/2017).   Ortho Exam  Patient is alert, oriented, no adenopathy, well-dressed, normal affect, normal respiratory effort. Examination patient has an antalgic gait she has varus alignment to both knees.  Patient has pain with Neer and Hawkins impingement test of both shoulders she has pain to palpation of the biceps tendon bilaterally.  Examination of the right knee reveals crepitation with range of motion she is tender over the medial joint line collaterals and cruciates are stable there is no effusion.  Imaging: Xr Knee 1-2 Views Right  Result Date: 09/05/2017 2 view radiographs of her right knee shows bone on bone contact medial joint line with varus alignment with periarticular bony spurs subcondylar  sclerosis no cysts.  No images are attached to the encounter.  Labs: Lab Results  Component Value Date   HGBA1C 8.1 (H) 07/10/2017   HGBA1C 8.7 06/05/2017   HGBA1C 11.2 02/13/2017   LABORGA NO GROWTH 12/04/2014    '@LABSALLVALUES' (HGBA1)@  Body mass index is 30.47 kg/m.  Orders:  Orders Placed This Encounter  Procedures  . Large Joint Inj  . Large Joint Inj  . XR Knee 1-2 Views Right   No orders of the defined types were placed in this encounter.    Procedures: Large Joint Inj: R knee on 09/05/2017 3:06 PM Indications: pain and diagnostic evaluation Details: 22 G 1.5 in needle, anteromedial approach  Arthrogram: No  Medications: 5 mL lidocaine 1 %; 40 mg methylPREDNISolone acetate 40 MG/ML Outcome: tolerated well, no immediate complications Procedure, treatment alternatives, risks and benefits explained, specific risks discussed. Consent was given by the patient. Immediately prior to procedure a time out was called to verify the correct patient, procedure, equipment, support staff and site/side marked as required. Patient was prepped and draped in the usual sterile fashion.   Large Joint Inj: L subacromial bursa on 09/05/2017 3:07 PM Indications: diagnostic evaluation and pain Details: 22 G 1.5 in needle, posterior approach  Arthrogram: No  Medications: 5 mL lidocaine 1 %; 40 mg methylPREDNISolone acetate 40 MG/ML Outcome: tolerated well, no immediate complications Procedure, treatment  alternatives, risks and benefits explained, specific risks discussed. Consent was given by the patient. Immediately prior to procedure a time out was called to verify the correct patient, procedure, equipment, support staff and site/side marked as required. Patient was prepped and draped in the usual sterile fashion.      Clinical Data: No additional findings.  ROS:  All other systems negative, except as noted in the HPI. Review of Systems  Objective: Vital Signs: Ht '5\' 3"'  (1.6 m)    Wt 172 lb (78 kg)   BMI 30.47 kg/m   Specialty Comments:  No specialty comments available.  PMFS History: Patient Active Problem List   Diagnosis Date Noted  . New onset atrial flutter ([place]) 07/10/2017  . Atrial flutter with rapid ventricular response ([place]) 07/10/2017  . Atrial fibrillation ([place]) 07/05/2017  . COPD (chronic obstructive pulmonary disease) ([place]) 01/22/2017  . Shortness of breath 12/25/2016  . Pedal edema   . Acute on chronic congestive heart failure ([place])   . Irregular heart beat   . Chronic midline low back pain without sciatica 08/15/2016  . 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 Impingement syndrome of right shoulder 08/15/2016  . Schizoaffective disorder, bipolar type ([place])   . URI (upper respiratory infection) 03/09/2016  . Muscle strain of chest wall 10/21/2015  . Activities of daily living deficit involving bathing of lower body 08/10/2015  . Hypokalemia 01/22/2015  . Vitamin D deficiency 12/19/2014  . Preventative health care 03/21/2014  . Bronchitis, chronic obstructive w acute bronchitis ([place]) 01/07/2014  . Osteoarthritis, multiple sites 06/08/2011  . Vaginal discharge 01/14/2011  . COPD exacerbation ([place]) 09/23/2010  . Fall 09/23/2010  . CHRONIC KIDNEY DISEASE STAGE II ([facility]) 09/14/2009  . Hypothyroidism 08/31/2006  . Type 2 diabetes mellitus ([place]) 08/31/2006  . HYPERCHOLESTEROLEMIA 08/31/2006  . Tobacco use disorder 08/31/2006  . HYPERTENSION, BENIGN SYSTEMIC 08/31/2006   Past Medical History:  Diagnosis Date  . Arthritis    "real bad; all over" (07/12/2017)  . Asthma   . BIPOLAR DISORDER 08/31/2006   Qualifier: Diagnosis of  By: Dorathy Daft MD, Marjory Lies    . CHRONIC KIDNEY DISEASE STAGE II ([facility]) 09/14/2009   Annotation: eGFR 64 Qualifier: Diagnosis of  By: Jess Barters MD, [name]    . Chronic lower back pain   . COPD (chronic obstructive pulmonary disease) ([place]) 09/23/2010   Diagnosed at [facility] in 2008 (Dr. Annamaria Boots)   . DM (diabetes mellitus) type II controlled with renal manifestation ([place]) 08/31/2006   Qualifier: Diagnosis of  By: Dorathy Daft MD, Marjory Lies    . Dyspnea    "all my life; since 6th grade" (07/12/2017)  . HYPERCHOLESTEROLEMIA 08/31/2006   Intolerance to Lipitor OK on Crestor but medicaid no longer covering    . HYPERTENSION, BENIGN SYSTEMIC 08/31/2006   Qualifier: Diagnosis of  By: Dorathy Daft MD, Marjory Lies    . HYPOTHYROIDISM, UNSPECIFIED 08/31/2006   Qualifier: Diagnosis of  By: Dorathy Daft MD, Marjory Lies    . Pneumonia    "3 times" (07/12/2017)  . Scoliosis     Family History  Problem Relation Age of Onset  . Breast cancer Sister     Past Surgical History:  Procedure Laterality Date  . CESAREAN SECTION    . FOOT FRACTURE SURGERY Right    "steel plate in it"  . FOOT SURGERY     " born w/dislocated foot"  . FRACTURE SURGERY    . KNEE ARTHROSCOPY Right   . TOE SURGERY Bilateral    "  both pinky toes"  . TONSILLECTOMY AND ADENOIDECTOMY     Social History   Occupational History  . Not on file  Tobacco Use  . Smoking status: Current Every Day Smoker    Packs/day: 0.50    Years: 45.00    Pack years: 22.50    Types: Cigarettes  . Smokeless tobacco: Never Used  Substance and Sexual Activity  . Alcohol use: Yes    Comment: 07/12/2017 "stopped 2 yr ago; did have a drink over the holidays recently"  . Drug use: No  . Sexual activity: Yes

## 2017-10-03 ENCOUNTER — Ambulatory Visit (INDEPENDENT_AMBULATORY_CARE_PROVIDER_SITE_OTHER): Payer: Medicare Other | Admitting: Orthopedic Surgery

## 2017-10-05 ENCOUNTER — Encounter: Payer: Self-pay | Admitting: Family Medicine

## 2017-10-05 ENCOUNTER — Other Ambulatory Visit: Payer: Self-pay

## 2017-10-05 ENCOUNTER — Ambulatory Visit (INDEPENDENT_AMBULATORY_CARE_PROVIDER_SITE_OTHER): Payer: Medicare Other | Admitting: Family Medicine

## 2017-10-05 VITALS — BP 110/60 | HR 94 | Temp 97.8°F | Ht 63.0 in | Wt 166.0 lb

## 2017-10-05 DIAGNOSIS — M150 Primary generalized (osteo)arthritis: Secondary | ICD-10-CM | POA: Diagnosis present

## 2017-10-05 DIAGNOSIS — I499 Cardiac arrhythmia, unspecified: Secondary | ICD-10-CM | POA: Diagnosis not present

## 2017-10-05 DIAGNOSIS — M159 Polyosteoarthritis, unspecified: Secondary | ICD-10-CM

## 2017-10-05 DIAGNOSIS — I4891 Unspecified atrial fibrillation: Secondary | ICD-10-CM

## 2017-10-05 DIAGNOSIS — M8949 Other hypertrophic osteoarthropathy, multiple sites: Secondary | ICD-10-CM

## 2017-10-05 DIAGNOSIS — R197 Diarrhea, unspecified: Secondary | ICD-10-CM | POA: Diagnosis not present

## 2017-10-05 DIAGNOSIS — K0889 Other specified disorders of teeth and supporting structures: Secondary | ICD-10-CM

## 2017-10-05 MED ORDER — OLOPATADINE HCL 0.1 % OP SOLN: 1.0000 [drp] | Freq: Two times a day (BID) | OPHTHALMIC | 5 refills | Status: DC | PRN

## 2017-10-05 MED ORDER — METFORMIN HCL ER 750 MG PO TB24: 750.0000 mg | ORAL_TABLET | Freq: Every day | ORAL | 5 refills | Status: DC

## 2017-10-05 MED ORDER — METOPROLOL SUCCINATE ER 50 MG PO TB24: 50.0000 mg | ORAL_TABLET | Freq: Every day | ORAL | 3 refills | Status: DC

## 2017-10-05 MED ORDER — CETIRIZINE HCL 5 MG PO TABS: 5.0000 mg | ORAL_TABLET | Freq: Every day | ORAL | 5 refills | Status: DC
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
## 2017-10-05 NOTE — Patient Instructions (Signed)
It was nice meeting you today Ms. Burgard!  Today, we discussed your pain from osteoarthritis and your diarrhea.  For your osteoarthritis, I am referring you to physical therapy.  Someone should be in contact with you about setting up an appointment with them.  For your diarrhea, I am changing your metformin to the extended release form of this medication, since metformin can cause diarrhea, which is usually caused less often with the extended release form.  I would like for you to start taking your Eliquis again as well as your metoprolol because you have atrial fibrillation.  I've sent in a new prescription for the metoprolol since you're out of this medication.    I have also refilled your pataday eye drops and prescribed zyrtec for your allergies.    If you have any questions or concerns, please feel free to call the clinic.   Be well,  Dr. Shan Levans

## 2017-10-06 DIAGNOSIS — R197 Diarrhea, unspecified: Secondary | ICD-10-CM | POA: Insufficient documentation

## 2017-10-06 NOTE — Assessment & Plan Note (Signed)
Likely due to metformin since it does not appear to be infectious and is likely not inflammatory.  Could also be due to IBS.  Will change metformin to ER 750 mg to see if this helps.

## 2017-10-06 NOTE — Assessment & Plan Note (Signed)
Discussed with patient that I will not prescribe narcotics for her pain.  Dr. Nida Boatman note from January and Dr. Perlie Mayo note from January also describe telling patient that she will no longer receive narcotics from our clinic. I did offer to refer her to PT and encouraged her to practice the exercises that have been given to her in the past, and patient says she is willing to try PT.  Detailed the side effects of narcotics and told her that it would be poor care to prescribe these to her.  Patient became angry after I told her that I would not prescribe hydrocodone and left clinic before I could finish the visit.

## 2017-10-08 DIAGNOSIS — K0889 Other specified disorders of teeth and supporting structures: Secondary | ICD-10-CM | POA: Insufficient documentation

## 2017-10-08 NOTE — Assessment & Plan Note (Signed)
Patient has had an irregular heart beat at past office visits and has been diagnosed with atrial fibrillation.  She has metoprolol and Eliquis prescribed to her but is not taking them.  She says that she will see her cardiologist next week.  I advised her to take these medications until her cardiologist assesses her.  I refilled her metoprolol since she had one pill remaining from taking it previously.

## 2017-10-08 NOTE — Assessment & Plan Note (Signed)
Encouraged patient to see her dentist as soon as possible to address her tooth pain, since this tooth could be infected and is likely contributing to her left sided facial pain.

## 2017-10-11 DIAGNOSIS — I10 Essential (primary) hypertension: Secondary | ICD-10-CM | POA: Diagnosis not present

## 2017-10-11 DIAGNOSIS — R072 Precordial pain: Secondary | ICD-10-CM | POA: Diagnosis not present

## 2017-10-11 DIAGNOSIS — E119 Type 2 diabetes mellitus without complications: Secondary | ICD-10-CM | POA: Diagnosis not present

## 2017-10-11 DIAGNOSIS — R0602 Shortness of breath: Secondary | ICD-10-CM | POA: Diagnosis not present

## 2017-10-18 DIAGNOSIS — E1165 Type 2 diabetes mellitus with hyperglycemia: Secondary | ICD-10-CM | POA: Diagnosis not present

## 2017-10-18 DIAGNOSIS — D649 Anemia, unspecified: Secondary | ICD-10-CM | POA: Diagnosis not present

## 2017-10-18 DIAGNOSIS — E559 Vitamin D deficiency, unspecified: Secondary | ICD-10-CM | POA: Diagnosis not present

## 2017-10-18 DIAGNOSIS — Z79899 Other long term (current) drug therapy: Secondary | ICD-10-CM | POA: Diagnosis not present

## 2017-10-18 DIAGNOSIS — E876 Hypokalemia: Secondary | ICD-10-CM | POA: Diagnosis not present

## 2017-10-18 DIAGNOSIS — E785 Hyperlipidemia, unspecified: Secondary | ICD-10-CM | POA: Diagnosis not present

## 2017-10-19 ENCOUNTER — Ambulatory Visit: Payer: Medicare Other | Attending: Family Medicine | Admitting: Physical Therapy

## 2017-10-19 DIAGNOSIS — M6281 Muscle weakness (generalized): Secondary | ICD-10-CM | POA: Insufficient documentation

## 2017-10-19 DIAGNOSIS — G8929 Other chronic pain: Secondary | ICD-10-CM | POA: Diagnosis not present

## 2017-10-19 DIAGNOSIS — M25512 Pain in left shoulder: Secondary | ICD-10-CM | POA: Insufficient documentation

## 2017-10-19 DIAGNOSIS — M25561 Pain in right knee: Secondary | ICD-10-CM | POA: Diagnosis not present

## 2017-10-19 DIAGNOSIS — M25511 Pain in right shoulder: Secondary | ICD-10-CM | POA: Insufficient documentation

## 2017-10-19 DIAGNOSIS — M25562 Pain in left knee: Secondary | ICD-10-CM | POA: Insufficient documentation
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
## 2017-10-19 NOTE — Patient Instructions (Signed)
Access Code: [email or web address]  URL: [email or web address]  Date: 10/19/2017  Prepared by: Raeford Razor   Exercises  Supine Shoulder Flexion Extension AAROM with [place] - 10 reps - 2 sets - 10 hold - 1x daily - 7x weekly  Supine Shoulder Abduction AAROM with [place] - 10 reps - 2 sets - 10 hold - 1x daily - 7x weekly  Standing Shoulder Extension with [place] - 10 reps - 2 sets - 10 hold - 1x daily - 7x weekly

## 2017-10-23 ENCOUNTER — Other Ambulatory Visit: Payer: Self-pay

## 2017-10-24 MED ORDER — FREESTYLE LANCETS MISC: 12 refills | Status: DC

## 2017-11-07 ENCOUNTER — Ambulatory Visit: Payer: Medicare Other | Admitting: Physical Therapy

## 2017-11-10 ENCOUNTER — Ambulatory Visit: Payer: Medicare Other | Admitting: Physical Therapy

## 2017-11-14 ENCOUNTER — Telehealth: Payer: Self-pay | Admitting: Physical Therapy

## 2017-11-14 ENCOUNTER — Ambulatory Visit: Payer: Medicare Other | Attending: Family Medicine | Admitting: Physical Therapy
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
## 2017-11-15 DIAGNOSIS — M1990 Unspecified osteoarthritis, unspecified site: Secondary | ICD-10-CM | POA: Diagnosis not present

## 2017-11-15 DIAGNOSIS — F17290 Nicotine dependence, other tobacco product, uncomplicated: Secondary | ICD-10-CM | POA: Diagnosis not present

## 2017-11-15 DIAGNOSIS — I2510 Atherosclerotic heart disease of native coronary artery without angina pectoris: Secondary | ICD-10-CM | POA: Diagnosis not present

## 2017-11-15 DIAGNOSIS — I10 Essential (primary) hypertension: Secondary | ICD-10-CM | POA: Diagnosis not present

## 2017-11-15 DIAGNOSIS — E785 Hyperlipidemia, unspecified: Secondary | ICD-10-CM | POA: Diagnosis not present

## 2017-11-15 DIAGNOSIS — Z716 Tobacco abuse counseling: Secondary | ICD-10-CM | POA: Diagnosis not present

## 2017-11-15 DIAGNOSIS — E119 Type 2 diabetes mellitus without complications: Secondary | ICD-10-CM | POA: Diagnosis not present

## 2017-11-15 DIAGNOSIS — Z720 Tobacco use: Secondary | ICD-10-CM | POA: Diagnosis not present

## 2017-11-15 DIAGNOSIS — I252 Old myocardial infarction: Secondary | ICD-10-CM | POA: Diagnosis not present

## 2017-11-15 DIAGNOSIS — R0609 Other forms of dyspnea: Secondary | ICD-10-CM | POA: Diagnosis not present

## 2017-11-16 ENCOUNTER — Ambulatory Visit: Payer: Medicare Other | Admitting: Physical Therapy

## 2017-11-21 ENCOUNTER — Ambulatory Visit: Payer: Medicare Other | Admitting: Physical Therapy

## 2017-11-23 ENCOUNTER — Ambulatory Visit: Payer: Medicare Other | Admitting: Physical Therapy

## 2017-11-28 ENCOUNTER — Ambulatory Visit: Payer: Medicare Other | Admitting: Physical Therapy

## 2017-11-30 ENCOUNTER — Ambulatory Visit: Payer: Medicare Other | Admitting: Physical Therapy

## 2017-12-06 ENCOUNTER — Ambulatory Visit (HOSPITAL_COMMUNITY)
Admission: EM | Admit: 2017-12-06 | Discharge: 2017-12-06 | Disposition: A | Discharge status: Home / Self Care | Payer: Medicare Other | Attending: Internal Medicine | Admitting: Internal Medicine

## 2017-12-06 ENCOUNTER — Emergency Department (HOSPITAL_COMMUNITY)
Admission: EM | Admit: 2017-12-06 | Discharge: 2017-12-06 | Disposition: A | Discharge status: Home / Self Care | Payer: Medicare Other | Attending: Emergency Medicine | Admitting: Emergency Medicine

## 2017-12-06 ENCOUNTER — Encounter (HOSPITAL_COMMUNITY): Payer: Self-pay | Admitting: Emergency Medicine

## 2017-12-06 ENCOUNTER — Encounter (HOSPITAL_COMMUNITY): Payer: Self-pay

## 2017-12-06 DIAGNOSIS — R200 Anesthesia of skin: Secondary | ICD-10-CM | POA: Insufficient documentation

## 2017-12-06 DIAGNOSIS — Z5321 Procedure and treatment not carried out due to patient leaving prior to being seen by health care provider: Secondary | ICD-10-CM | POA: Diagnosis not present

## 2017-12-06 LAB — CBC
HCT: 38.1 % (ref 36.0–46.0)
Hemoglobin: 13 g/dL (ref 12.0–15.0)
MCH: 35.1 pg — ABNORMAL HIGH (ref 26.0–34.0)
MCHC: 34.1 g/dL (ref 30.0–36.0)
MCV: 103 fL — ABNORMAL HIGH (ref 78.0–100.0)
Platelets: 178 10*3/uL (ref 150–400)
RBC: 3.7 MIL/uL — ABNORMAL LOW (ref 3.87–5.11)
RDW: 14.3 % (ref 11.5–15.5)
WBC: 6.2 10*3/uL (ref 4.0–10.5)

## 2017-12-06 LAB — PROTIME-INR
INR: 0.94
Prothrombin Time: 12.5 seconds (ref 11.4–15.2)

## 2017-12-06 LAB — DIFFERENTIAL
Abs Immature Granulocytes: 0 10*3/uL (ref 0.0–0.1)
Basophils Absolute: 0 10*3/uL (ref 0.0–0.1)
Basophils Relative: 0 %
Eosinophils Absolute: 0.1 10*3/uL (ref 0.0–0.7)
Eosinophils Relative: 1 %
Immature Granulocytes: 0 %
Lymphocytes Relative: 51 %
Lymphs Abs: 3.2 10*3/uL (ref 0.7–4.0)
Monocytes Absolute: 0.8 10*3/uL (ref 0.1–1.0)
Monocytes Relative: 13 %
Neutro Abs: 2.2 10*3/uL (ref 1.7–7.7)
Neutrophils Relative %: 35 %

## 2017-12-06 LAB — COMPREHENSIVE METABOLIC PANEL
ALT: 10 U/L — ABNORMAL LOW (ref 14–54)
AST: 13 U/L — ABNORMAL LOW (ref 15–41)
Albumin: 3.3 g/dL — ABNORMAL LOW (ref 3.5–5.0)
Alkaline Phosphatase: 62 U/L (ref 38–126)
Anion gap: 11 (ref 5–15)
BUN: <5 mg/dL — ABNORMAL LOW (ref 6–20)
CO2: 34 mmol/L — ABNORMAL HIGH (ref 22–32)
Calcium: 9.1 mg/dL (ref 8.9–10.3)
Chloride: 88 mmol/L — ABNORMAL LOW (ref 101–111)
Creatinine, Ser: 0.9 mg/dL (ref 0.44–1.00)
GFR calc Af Amer: >60 mL/min (ref >60)
GFR calc non Af Amer: >60 mL/min (ref >60)
Glucose, Bld: 126 mg/dL — ABNORMAL HIGH (ref 65–99)
Potassium: 2.9 mmol/L — ABNORMAL LOW (ref 3.5–5.1)
Sodium: 133 mmol/L — ABNORMAL LOW (ref 135–145)
Total Bilirubin: 0.5 mg/dL (ref 0.3–1.2)
Total Protein: 7.5 g/dL (ref 6.5–8.1)

## 2017-12-06 LAB — APTT: aPTT: 29 seconds (ref 24–36)

## 2017-12-06 NOTE — ED Triage Notes (Signed)
Pt reports that for the past month on and off she has been having L sided facial numbness on and off, pt reports weakness, neuro intact and strong bilaterally.

## 2017-12-06 NOTE — ED Triage Notes (Signed)
PT has been taking metoprolol and eliquis (new meds) for 3 months. PT reports intermittent left side numbness for same amount of time. PT's cardiologist told her to wean off of metoprolol and eliquis. PT reports perceived numbness on left side from scalp to toes. It comes and goes. Current episode has been going on for over 24 hours.  Also wants to be seen for bronchitis.

## 2017-12-06 NOTE — Discharge Instructions (Signed)
Recommend further evaluation and treatment at ER.   Patient is aware and in agreement with this plan.  Will go by private vehicle to [facility].

## 2017-12-06 NOTE — ED Provider Notes (Signed)
[facility]   [id number] 12/06/17 Arrival Time: 1628  SUBJECTIVE:  [name] Long is a 67 y.o. female hx significant for bipolar disorder, COPD, HLD, Hypothyroid, CKD, DM, HTN, asthma, and chronic low back pain who complains of intermittent bilateral numbness and pain that began 3 months ago.  Denies a precipitating event, or recent head trauma, but admits to starting metoprolol and eliquis at that time for "heart skipping a beat."  Patient was instructed by cardiologist to wean off these medications, today was the first day she has not taken these medications.  Patient reports left-sided numbness from top of head to tips of toes for the past 24 hours.  Describes the numbness as intermittent and alternating from right to left with episodes lasting approximately half-hour.  Patient has tried hydrocodone without relief. Symptoms are made worse with walking, laying down, or sitting for extended periods of time.  Denies previous symptoms in the past.  Complains of chills, decreased appetite, nausea, sinus congestion, and weakness.  Patient denies fever, vomiting, rhinorrhea, chest pain, SOB, or abdominal pain.     ROS: As per HPI.  Past Medical History:  Diagnosis Date  . Arthritis    "real bad; all over" (07/12/2017)  . Asthma   . BIPOLAR DISORDER 08/31/2006   Qualifier: Diagnosis of  By: Dorathy Daft MD, Marjory Lies    . CHRONIC KIDNEY DISEASE STAGE II (MILD) 09/14/2009   Annotation: eGFR 64 Qualifier: Diagnosis of  By: Jess Barters MD, [name]    . Chronic lower back pain   . COPD (chronic obstructive pulmonary disease) ([place]) 09/23/2010   Diagnosed at [facility] in 2008 (Dr. Annamaria Boots)   . DM (diabetes mellitus) type II controlled with renal manifestation ([place]) 08/31/2006   Qualifier: Diagnosis of  By: Dorathy Daft MD, Marjory Lies    . Dyspnea    "all my life; since 6th grade" (07/12/2017)  . HYPERCHOLESTEROLEMIA 08/31/2006   Intolerance to Lipitor OK on Crestor but medicaid no longer covering    . HYPERTENSION,  BENIGN SYSTEMIC 08/31/2006   Qualifier: Diagnosis of  By: Dorathy Daft MD, Marjory Lies    . HYPOTHYROIDISM, UNSPECIFIED 08/31/2006   Qualifier: Diagnosis of  By: Dorathy Daft MD, Marjory Lies    . Pneumonia    "3 times" (07/12/2017)  . Scoliosis    Past Surgical History:  Procedure Laterality Date  . CESAREAN SECTION    . FOOT FRACTURE SURGERY Right    "steel plate in it"  . FOOT SURGERY     " born w/dislocated foot"  . FRACTURE SURGERY    . KNEE ARTHROSCOPY Right   . TOE SURGERY Bilateral    "both pinky toes"  . TONSILLECTOMY AND ADENOIDECTOMY     Allergies  Allergen Reactions  . Citrus Anaphylaxis and Itching  . Fish Allergy Anaphylaxis    Cod  . Shellfish Allergy Shortness Of Breath and Other (See Comments)    "Affects thyroid" also  . Adhesive [Tape] Other (See Comments)    Must have paper tape only  . Ibuprofen Swelling    Face swells  . Lipitor [Atorvastatin Calcium] Other (See Comments)    Body aches  . Lisinopril Other (See Comments)    PER DR. Melvyn Novas (not recalled by patient)  . Other Nausea Only and Other (See Comments)    Collards (gas, too)  . Codeine Rash   Current Facility-Administered Medications on File Prior to Encounter  Medication Dose Route Frequency Provider Last Rate Last Dose  . ipratropium-albuterol (DUONEB) 0.5-2.5 (3) MG/3ML nebulizer solution 3 mL  3 mL Nebulization Q6H Eloise Levels, MD       Current Outpatient Medications on File Prior to Encounter  Medication Sig Dispense Refill  . albuterol (PROVENTIL HFA) 108 (90 Base) MCG/ACT inhaler Inhale 2 puffs into the lungs every 6 (six) hours as needed for wheezing or shortness of breath.    Marland Kitchen albuterol (PROVENTIL) (2.5 MG/3ML) 0.083% nebulizer solution Take 2.5 mg by nebulization every 6 (six) hours as needed for wheezing or shortness of breath.    Marland Kitchen apixaban (ELIQUIS) 5 MG TABS tablet Take 1 tablet (5 mg total) by mouth 2 (two) times daily. 60 tablet 3  . azithromycin (ZITHROMAX) 250 MG tablet Two daily in AM then  one daily x 4 days 6 each 0  . Blood Glucose Monitoring Suppl (ONETOUCH VERIO) w/Device KIT 1 kit by Does not apply route 4 (four) times daily. ICD-10 Code: E11.9. 1 kit 0  . cetirizine (ZYRTEC) 5 MG tablet Take 1 tablet (5 mg total) by mouth daily. 30 tablet 5  . diltiazem (CARDIZEM CD) 240 MG 24 hr capsule Take 1 capsule (240 mg total) by mouth daily. 30 capsule 3  . divalproex (DEPAKOTE) 500 MG DR tablet Take 2 tablets (1,000 mg total) by mouth 2 (two) times daily. 60 tablet 0  . fluticasone (FLONASE) 50 MCG/ACT nasal spray Place 2 sprays into both nostrils daily.    . fluticasone (FLOVENT HFA) 110 MCG/ACT inhaler Inhale 1 puff into the lungs 2 (two) times daily. 1 Inhaler 12  . glucose blood (FREESTYLE TEST STRIPS) test strip Use as instructed 100 each 12  . glucose blood (ONETOUCH VERIO) test strip Use as instructed to test blood sugar up to 4 times daily. ICD-10 code: E11.9. 100 each 12  . hydrochlorothiazide (HYDRODIURIL) 25 MG tablet Take 1 tablet (25 mg total) by mouth daily. 90 tablet 3  . Lancets (FREESTYLE) lancets Use as instructed 100 each 12  . levothyroxine (SYNTHROID) 50 MCG tablet Take 1 tablet (50 mcg total) by mouth daily before breakfast. 90 tablet 3  . linagliptin (TRADJENTA) 5 MG TABS tablet Take 1 tablet (5 mg total) by mouth daily. 30 tablet 1  . LORazepam (ATIVAN) 1 MG tablet Take 1 mg by mouth 2 (two) times daily at 8 am and 10 pm.     . metFORMIN (GLUCOPHAGE-XR) 750 MG 24 hr tablet Take 1 tablet (750 mg total) by mouth daily with breakfast. 30 tablet 5  . metoprolol succinate (TOPROL-XL) 50 MG 24 hr tablet Take 1 tablet (50 mg total) by mouth daily. Take with or immediately following a meal. 90 tablet 3  . nicotine (NICODERM CQ - DOSED IN MG/24 HOURS) 14 mg/24hr patch Place 1 patch (14 mg total) onto the skin daily. 28 patch 0  . olopatadine (PATANOL) 0.1 % ophthalmic solution Place 1 drop into both eyes 2 (two) times daily as needed for allergies. 5 mL 5  . omeprazole  (PRILOSEC) 20 MG capsule Take 1 capsule (20 mg total) by mouth daily. 90 capsule 1  . ONETOUCH DELICA LANCETS FINE MISC 1 each by Does not apply route 4 (four) times daily. ICD-10 Code: E11.9 100 each 12  . pravastatin (PRAVACHOL) 40 MG tablet Take 1 tablet (40 mg total) by mouth every evening. 30 tablet 9  . predniSONE (DELTASONE) 10 MG tablet Take 1 tablet (10 mg total) by mouth daily before breakfast. For 4 days then 1/2 tablet daily x 8 days 8 tablet 0  . risperiDONE (RISPERDAL) 1 MG tablet  Take 1 tablet (1 mg total) by mouth 2 (two) times daily. 60 tablet 1  . trihexyphenidyl (ARTANE) 5 MG tablet Take 1 tablet (5 mg total) by mouth 2 (two) times daily with a meal. 60 tablet 0   Social History   Socioeconomic History  . Marital status: Divorced    Spouse name: Not on file  . Number of children: Not on file  . Years of education: Not on file  . Highest education level: Not on file  Occupational History  . Not on file  Social Needs  . Financial resource strain: Not on file  . Food insecurity:    Worry: Not on file    Inability: Not on file  . Transportation needs:    Medical: Not on file    Non-medical: Not on file  Tobacco Use  . Smoking status: Current Every Day Smoker    Packs/day: 0.50    Years: 45.00    Pack years: 22.50    Types: Cigarettes  . Smokeless tobacco: Never Used  Substance and Sexual Activity  . Alcohol use: Yes    Comment: 07/12/2017 "stopped 2 yr ago; did have a drink over the holidays recently"  . Drug use: No  . Sexual activity: Yes  Lifestyle  . Physical activity:    Days per week: Not on file    Minutes per session: Not on file  . Stress: Not on file  Relationships  . Social connections:    Talks on phone: Not on file    Gets together: Not on file    Attends religious service: Not on file    Active member of club or organization: Not on file    Attends meetings of clubs or organizations: Not on file    Relationship status: Not on file  .  Intimate partner violence:    Fear of current or ex partner: Not on file    Emotionally abused: Not on file    Physically abused: Not on file    Forced sexual activity: Not on file  Other Topics Concern  . Not on file  Social History Narrative  . Not on file   Family History  Problem Relation Age of Onset  . Breast cancer Sister     OBJECTIVE:  Vitals:   12/06/17 1724 12/06/17 1725 12/06/17 1726  BP:   136/90  Pulse:  73   Resp:  18   Temp:  98.4 F (36.9 C)   TempSrc:  Temporal   SpO2:  99%   Weight: 170 lb (77.1 kg)      General appearance: alert; appear older than stated age; appears chronically ill Eyes: PERRLA; EOMI HENT: normocephalic; atraumatic Neck: supple with FROM Lungs: clear to auscultation bilaterally Heart: regular rate and rhythm.  Radial pulses 2+ symmetrical bilaterally Extremities: bilateral ankle nonpitting edema; symmetrical with no gross deformities Skin: warm and dry Neurologic: CN 2-12 grossly intact; finger to nose with minimal difficulty; walks from chair to exam table without difficulty; strength intact bilaterally about the upper and lower extremities; decreased sensation about left side of face, UE, and LE Psychological: alert and cooperative; normal mood and affect   Imaging: No results found.  ASSESSMENT & PLAN:  1. Left sided numbness     No orders of the defined types were placed in this encounter.   Recommend further evaluation and treatment at ER.   Patient is aware and in agreement with this plan.  Will go by private vehicle to [place] [facility]  ER.    Reviewed expectations re: course of current medical issues. Questions answered. Outlined signs and symptoms indicating need for more acute intervention. Patient verbalized understanding. After Visit Summary given.   Lestine Box, PA-C 12/06/17 1906

## 2017-12-06 NOTE — ED Notes (Signed)
Pt states "my daughters are my ride, they do not want to stay, I am leaving."

## 2017-12-06 NOTE — ED Triage Notes (Signed)
Face symmetrical, grips strong and symmetrical

## 2017-12-07 ENCOUNTER — Encounter: Payer: Self-pay | Admitting: Family Medicine

## 2017-12-07 ENCOUNTER — Encounter (HOSPITAL_COMMUNITY): Payer: Self-pay | Admitting: Emergency Medicine

## 2017-12-07 ENCOUNTER — Emergency Department (HOSPITAL_COMMUNITY)
Admission: EM | Admit: 2017-12-07 | Discharge: 2017-12-07 | Discharge status: Against Medical Advice | Payer: Medicare Other | Attending: Emergency Medicine | Admitting: Emergency Medicine

## 2017-12-07 ENCOUNTER — Emergency Department (HOSPITAL_COMMUNITY): Payer: Medicare Other

## 2017-12-07 DIAGNOSIS — R200 Anesthesia of skin: Secondary | ICD-10-CM | POA: Diagnosis not present

## 2017-12-07 DIAGNOSIS — Z5321 Procedure and treatment not carried out due to patient leaving prior to being seen by health care provider: Secondary | ICD-10-CM | POA: Insufficient documentation

## 2017-12-07 DIAGNOSIS — R202 Paresthesia of skin: Secondary | ICD-10-CM | POA: Diagnosis not present

## 2017-12-07 DIAGNOSIS — M25571 Pain in right ankle and joints of right foot: Secondary | ICD-10-CM | POA: Diagnosis not present

## 2017-12-07 DIAGNOSIS — M7989 Other specified soft tissue disorders: Secondary | ICD-10-CM | POA: Diagnosis not present

## 2017-12-07 LAB — I-STAT CHEM 8, ED
BUN: 5 mg/dL — ABNORMAL LOW (ref 6–20)
Calcium, Ion: 1.12 mmol/L — ABNORMAL LOW (ref 1.15–1.40)
Chloride: 86 mmol/L — ABNORMAL LOW (ref 101–111)
Creatinine, Ser: 0.8 mg/dL (ref 0.44–1.00)
Glucose, Bld: 121 mg/dL — ABNORMAL HIGH (ref 65–99)
HCT: 42 % (ref 36.0–46.0)
Hemoglobin: 14.3 g/dL (ref 12.0–15.0)
Potassium: 2.9 mmol/L — ABNORMAL LOW (ref 3.5–5.1)
Sodium: 132 mmol/L — ABNORMAL LOW (ref 135–145)
TCO2: 33 mmol/L — ABNORMAL HIGH (ref 22–32)

## 2017-12-07 LAB — I-STAT TROPONIN, ED: Troponin i, poc: 0 ng/mL (ref 0.00–0.08)

## 2017-12-07 NOTE — ED Notes (Signed)
Pt informed RN that she will go to her MD tomorrow. She does not want to wait any longer. RN advised against leaving. Pt states she acknowledges risks, but decides to leave.

## 2017-12-07 NOTE — ED Triage Notes (Signed)
Pt states she has been having L sided tingling/numbness from left temple to left foot. Grips equal. No facial droop. Pt was seen here yesterday but had to leave due to family reasons. Pt also reports right ankle pain. Pt broke her right ankle earlier in the year and it was doing better. However, lately, her foot has become swollen again and painful.

## 2017-12-07 NOTE — ED Notes (Signed)
No labs ordered in triage due to recent labs drawn last evening.

## 2017-12-08 ENCOUNTER — Telehealth: Payer: Self-pay | Admitting: Family Medicine

## 2017-12-08 ENCOUNTER — Ambulatory Visit: Payer: Medicare Other | Admitting: Internal Medicine

## 2017-12-08 NOTE — Telephone Encounter (Signed)
Called patient regarding her question about her CMP.  Told her that her potassium was low, and she said that she restarted her potassium pills after finding that out in the ED.  She also wanted to discuss whether the CT scan showed evidence of a stroke, and I told her that it did not.  She then talked about how she needs pain medication to help her through all of her physical pains as well as to help her handle her family situation.  She says that Dr. Doylene Canard told her that pain would cause her heart beat "to go out of rotation."  I told her again that her previous doctors had written on her chart that the clinic would not prescribe her opioid medication.  She then also said that she has vaginal pain and can't take monistat because she is "laid up in bed" due to her pain.  She wants to have her vagina checked at her next visit.  I told her that it may be beneficial to have her potassium rechecked in clinic, but I reiterated that we need to think of other ways to help her manage her pain and stress rather than to use narcotics for this.

## 2017-12-08 NOTE — ED Notes (Signed)
Follow up call made  Pt has appointment w pcp,  Was advised to return to ed for any new or concerning symptoms  12/07/17  0947  s coble rn

## 2017-12-13 ENCOUNTER — Encounter (INDEPENDENT_AMBULATORY_CARE_PROVIDER_SITE_OTHER): Payer: Self-pay | Admitting: Family

## 2017-12-13 ENCOUNTER — Ambulatory Visit (INDEPENDENT_AMBULATORY_CARE_PROVIDER_SITE_OTHER): Payer: Medicare Other | Admitting: Family

## 2017-12-13 ENCOUNTER — Other Ambulatory Visit: Payer: Self-pay | Admitting: Family Medicine

## 2017-12-13 VITALS — Ht 63.0 in | Wt 173.0 lb

## 2017-12-13 DIAGNOSIS — M25512 Pain in left shoulder: Secondary | ICD-10-CM

## 2017-12-13 DIAGNOSIS — Z1231 Encounter for screening mammogram for malignant neoplasm of breast: Secondary | ICD-10-CM

## 2017-12-13 DIAGNOSIS — I872 Venous insufficiency (chronic) (peripheral): Secondary | ICD-10-CM | POA: Diagnosis not present

## 2017-12-13 DIAGNOSIS — G8929 Other chronic pain: Secondary | ICD-10-CM | POA: Diagnosis not present

## 2017-12-13 NOTE — Progress Notes (Signed)
Office Visit Note   Patient: [name] Long           Date of Birth: Mar 25, 1951           MRN: [id number] Visit Date: 12/13/2017              Requested by: Kathrene Alu, MD [address], [place] [address] PCP: Kathrene Alu, MD  Chief Complaint  Patient presents with  . Right Shoulder - Pain  . Right Leg - Numbness  . Left Leg - Numbness      HPI: Patient is a 67 year old woman with osteoarthritis. she states she has arthritis from the tip of her head to the tip of her toes.  She complains of neck pain, lower back pain bilateral shoulder pain, swelling to RLE, pain to right foot. States her worst symptoms at this time are her left shoulder pain and right foot pain.  Patient wears a neoprene brace for her right knee.  Assessment & Plan: Visit Diagnoses:  1. Chronic left shoulder pain   2. Venous insufficiency (chronic) (peripheral)     Plan: Left shoulder injected. Encouraged to begin compression stockings for LE edema. The importance of strengthening for the arthritis was discussed the importance of exercise was discussed.  Follow-Up Instructions: Return if symptoms worsen or fail to improve.   Ortho Exam  Patient is alert, oriented, no adenopathy, well-dressed, normal affect, normal respiratory effort. Examination  Patient has pain with Neer and Hawkins impingement test of both shoulders she has pain to palpation of the biceps tendon bilaterally.  1+ pitting edema to RLE. No erythema. No cellultis. No drainage.  Imaging: No results found. No images are attached to the encounter.  Labs: Lab Results  Component Value Date   HGBA1C 8.1 (H) 07/10/2017   HGBA1C 8.7 06/05/2017   HGBA1C 11.2 02/13/2017   LABORGA NO GROWTH 12/04/2014    '@LABSALLVALUES' (HGBA1)@  Body mass index is 30.65 kg/m.  Orders:  No orders of the defined types were placed in this encounter.  No orders of the defined types were placed in this encounter.     Procedures: No procedures performed  Clinical Data: No additional findings.  ROS:  All other systems negative, except as noted in the HPI. Review of Systems  Constitutional: Negative for chills and fever.  Cardiovascular: Positive for leg swelling.  Musculoskeletal: Positive for arthralgias.  Skin: Negative for wound.    Objective: Vital Signs: Ht '5\' 3"'  (1.6 m)   Wt 173 lb (78.5 kg)   BMI 30.65 kg/m   Specialty Comments:  No specialty comments available.  PMFS History: Patient Active Problem List   Diagnosis Date Noted  . Tooth pain 10/08/2017  . Diarrhea 10/06/2017  . New onset atrial flutter ([place]) 07/10/2017  . Atrial flutter with rapid ventricular response ([place]) 07/10/2017  . Atrial fibrillation ([place]) 07/05/2017  . COPD (chronic obstructive pulmonary disease) ([place]) 01/22/2017  . Shortness of breath 12/25/2016  . Pedal edema   . Acute on chronic congestive heart failure ([place])   . Irregular heart beat   . Chronic midline low back pain without sciatica 08/15/2016  . 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 Impingement syndrome of right shoulder 08/15/2016  . Schizoaffective disorder, bipolar type ([place])   . URI (upper respiratory infection) 03/09/2016  . Muscle strain of chest wall 10/21/2015  . Activities of daily living deficit involving bathing of lower body 08/10/2015  . Hypokalemia 01/22/2015  .  Vitamin D deficiency 12/19/2014  . Preventative health care 03/21/2014  . Bronchitis, chronic obstructive w acute bronchitis ([place]) 01/07/2014  . Osteoarthritis, multiple sites 06/08/2011  . Vaginal discharge 01/14/2011  . COPD exacerbation ([place]) 09/23/2010  . Fall 09/23/2010  . CHRONIC KIDNEY DISEASE STAGE II (MILD) 09/14/2009  . Hypothyroidism 08/31/2006  . Type 2 diabetes mellitus ([place]) 08/31/2006  . HYPERCHOLESTEROLEMIA 08/31/2006  . Tobacco use disorder 08/31/2006  . HYPERTENSION, BENIGN SYSTEMIC 08/31/2006    Past Medical History:  Diagnosis Date  . Arthritis    "real bad; all over" (07/12/2017)  . Asthma   . BIPOLAR DISORDER 08/31/2006   Qualifier: Diagnosis of  By: Dorathy Daft MD, Marjory Lies    . CHRONIC KIDNEY DISEASE STAGE II (MILD) 09/14/2009   Annotation: eGFR 64 Qualifier: Diagnosis of  By: Jess Barters MD, [name]    . Chronic lower back pain   . COPD (chronic obstructive pulmonary disease) ([place]) 09/23/2010   Diagnosed at [facility] in 2008 (Dr. Annamaria Boots)   . DM (diabetes mellitus) type II controlled with renal manifestation ([place]) 08/31/2006   Qualifier: Diagnosis of  By: Dorathy Daft MD, Marjory Lies    . Dyspnea    "all my life; since 6th grade" (07/12/2017)  . HYPERCHOLESTEROLEMIA 08/31/2006   Intolerance to Lipitor OK on Crestor but medicaid no longer covering    . HYPERTENSION, BENIGN SYSTEMIC 08/31/2006   Qualifier: Diagnosis of  By: Dorathy Daft MD, Marjory Lies    . HYPOTHYROIDISM, UNSPECIFIED 08/31/2006   Qualifier: Diagnosis of  By: Dorathy Daft MD, Marjory Lies    . Pneumonia    "3 times" (07/12/2017)  . Scoliosis     Family History  Problem Relation Age of Onset  . Breast cancer Sister     Past Surgical History:  Procedure Laterality Date  . CESAREAN SECTION    . FOOT FRACTURE SURGERY Right    "steel plate in it"  . FOOT SURGERY     " born w/dislocated foot"  . FRACTURE SURGERY    . KNEE ARTHROSCOPY Right   . TOE SURGERY Bilateral    "both pinky toes"  . TONSILLECTOMY AND ADENOIDECTOMY     Social History   Occupational History  . Not on file  Tobacco Use  . Smoking status: Current Every Day Smoker    Packs/day: 0.50    Years: 45.00    Pack years: 22.50    Types: Cigarettes  . Smokeless tobacco: Never Used  Substance and Sexual Activity  . Alcohol use: Yes    Comment: 07/12/2017 "stopped 2 yr ago; did have a drink over the holidays recently"  . Drug use: No  . Sexual activity: Yes

## 2017-12-14 ENCOUNTER — Ambulatory Visit: Payer: Medicare Other | Admitting: Student

## 2017-12-18 ENCOUNTER — Telehealth: Payer: Self-pay | Admitting: Family Medicine

## 2017-12-18 NOTE — Telephone Encounter (Signed)
Pt called requesting a refill on her metformin and test strips to last her until her appt on Monday 12-25-17. She said where she is at she is not able to stay on her diet and has to eat whatever they fix her and it's been running her sugars up. She said they have been around 230 and because of this she has been taking 2 metformin a day instead of 1 and it's been helping lower her sugars. She has also been checking her sugar 2x daily now instead of 1x daily as well. She would like enough of these 2 things to last her until Monday sent in to [facility] on [address]. Please advise

## 2017-12-19 ENCOUNTER — Other Ambulatory Visit: Payer: Self-pay | Admitting: Family Medicine

## 2017-12-19 ENCOUNTER — Telehealth: Payer: Self-pay | Admitting: *Deleted

## 2017-12-19 MED ORDER — METFORMIN HCL ER 750 MG PO TB24: 750.0000 mg | ORAL_TABLET | Freq: Every day | ORAL | 5 refills | Status: DC

## 2017-12-19 NOTE — Telephone Encounter (Signed)
Pt calls and states that she is still having the same symptoms as she did when she went to the ED.  C/o of facial drooping and numbness from head to toes on left side.    We have no openings today so, I advised her to go to the ED.  She is reluctant but agreeable to plan. Fleeger, Salome Spotted, CMA

## 2017-12-19 NOTE — Telephone Encounter (Signed)
Pt will call pharmacy about strips as she should have refills, since directions originally said to test 4 times daily.  Will forward to MD about metformin, if she is ok with pt taking 2 then she will need a refill. Fleeger, Salome Spotted, CMA

## 2017-12-21 DIAGNOSIS — I10 Essential (primary) hypertension: Secondary | ICD-10-CM | POA: Diagnosis not present

## 2017-12-21 DIAGNOSIS — J4520 Mild intermittent asthma, uncomplicated: Secondary | ICD-10-CM | POA: Diagnosis not present

## 2017-12-21 DIAGNOSIS — R0602 Shortness of breath: Secondary | ICD-10-CM | POA: Diagnosis not present

## 2017-12-21 DIAGNOSIS — R072 Precordial pain: Secondary | ICD-10-CM | POA: Diagnosis not present

## 2017-12-25 ENCOUNTER — Ambulatory Visit (INDEPENDENT_AMBULATORY_CARE_PROVIDER_SITE_OTHER): Payer: Medicare Other | Admitting: Internal Medicine

## 2017-12-25 VITALS — BP 112/60 | HR 79 | Temp 98.4°F | Resp 18 | Ht 65.0 in | Wt 168.0 lb

## 2017-12-25 DIAGNOSIS — E119 Type 2 diabetes mellitus without complications: Secondary | ICD-10-CM | POA: Diagnosis present

## 2017-12-25 DIAGNOSIS — B373 Candidiasis of vulva and vagina: Secondary | ICD-10-CM | POA: Diagnosis not present

## 2017-12-25 DIAGNOSIS — M545 Low back pain, unspecified: Secondary | ICD-10-CM

## 2017-12-25 DIAGNOSIS — B3731 Acute candidiasis of vulva and vagina: Secondary | ICD-10-CM

## 2017-12-25 DIAGNOSIS — G8929 Other chronic pain: Secondary | ICD-10-CM

## 2017-12-25 LAB — POCT GLYCOSYLATED HEMOGLOBIN (HGB A1C): HbA1c, POC (controlled diabetic range): 7.7 % — AB (ref 0.0–7.0)

## 2017-12-25 MED ORDER — METFORMIN HCL ER 750 MG PO TB24: 750.0000 mg | ORAL_TABLET | Freq: Every day | ORAL | 5 refills | Status: DC

## 2017-12-25 MED ORDER — GLUCOSE BLOOD VI STRP: ORAL_STRIP | 12 refills | Status: DC

## 2017-12-25 MED ORDER — TRAMADOL HCL 50 MG PO TABS: 50.0000 mg | ORAL_TABLET | Freq: Three times a day (TID) | ORAL | 0 refills | Status: DC | PRN

## 2017-12-25 MED ORDER — FLUCONAZOLE 150 MG PO TABS: ORAL_TABLET | ORAL | 0 refills | Status: DC
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
## 2017-12-25 NOTE — Patient Instructions (Signed)
I have refilled your Metformin and test strips. Your A1c looks better with a result of 7.7.   I have sent Diflucan to your pharmacy for a yeast infection. Take one tablet now and the other in 72 hours. If you still have symptoms please return for an exam.   I have prescribed Tramadol for your back pain. You should be seen by your PCP, Dr. Shan Levans, in one month to decide if this medication should be continued.

## 2017-12-26 ENCOUNTER — Telehealth: Payer: Self-pay | Admitting: Family Medicine

## 2017-12-26 ENCOUNTER — Other Ambulatory Visit: Payer: Self-pay | Admitting: Family Medicine

## 2017-12-26 DIAGNOSIS — Z0000 Encounter for general adult medical examination without abnormal findings: Secondary | ICD-10-CM

## 2017-12-26 DIAGNOSIS — Z1239 Encounter for other screening for malignant neoplasm of breast: Secondary | ICD-10-CM

## 2017-12-26 MED ORDER — GLUCOSE BLOOD VI STRP: ORAL_STRIP | 12 refills | Status: DC

## 2017-12-26 NOTE — Telephone Encounter (Signed)
Pt was in yesterday to see Juleen China and was given Rx for 30 Freestyle strips. Her pharmacy said that Medicaid will only pay for the supply of 100, but she would have to pay out of pocket for the 30 and she cant do that. Can we get the 100 supply sent into the [facility] asap so she can get these today. Please advise

## 2017-12-26 NOTE — Telephone Encounter (Signed)
Pt called back and said [facility]'s [facility]. She said the dr usually makes the appt for her and calls her with the appt. Please call pt to discuss this.

## 2017-12-26 NOTE — Telephone Encounter (Signed)
I also sent in Ms. Eltringham's test strips to [facility].

## 2017-12-26 NOTE — Telephone Encounter (Signed)
Would you mind calling [name] Long to let her know that she will need to call the [facility] and make the appointment herself.  I will be happy to put the referral in for her.

## 2017-12-27 DIAGNOSIS — F209 Schizophrenia, unspecified: Secondary | ICD-10-CM | POA: Diagnosis not present

## 2017-12-27 NOTE — Telephone Encounter (Signed)
Contacted pt and informed her of below and she also stated that she cannot continue to take tramadol cause it makes her have nausea, wavy headed, and sick.  She said it did help a little with the pain but since it made her feel that way she wasn't taking it anymore. Routing to PCP as an Pharmacist, hospital. Katharina Caper, April D, [place]

## 2017-12-28 ENCOUNTER — Telehealth: Payer: Self-pay | Admitting: Family Medicine

## 2017-12-28 NOTE — Telephone Encounter (Signed)
[facility] for [facility] and bone density . Please advise [phone]

## 2017-12-30 ENCOUNTER — Other Ambulatory Visit: Payer: Self-pay | Admitting: Family Medicine

## 2017-12-30 DIAGNOSIS — Z780 Asymptomatic menopausal state: Secondary | ICD-10-CM

## 2017-12-30 NOTE — Telephone Encounter (Signed)
I will put in a referral for her bone density scan today.  I placed the mammography referral for her last week.  Thanks!

## 2018-01-02 ENCOUNTER — Other Ambulatory Visit: Payer: Self-pay | Admitting: Family Medicine

## 2018-01-02 DIAGNOSIS — E2839 Other primary ovarian failure: Secondary | ICD-10-CM

## 2018-01-03 NOTE — Telephone Encounter (Signed)
Mammo and Dexa scan have been scheduled for 8/16. Ottis Stain, CMA

## 2018-01-05 ENCOUNTER — Other Ambulatory Visit: Payer: Self-pay | Admitting: *Deleted

## 2018-01-05 MED ORDER — GLUCOSE BLOOD VI STRP: ORAL_STRIP | 12 refills | Status: DC

## 2018-01-05 NOTE — Telephone Encounter (Signed)
Opened in error. Fleeger, Jessica Dawn, CMA  

## 2018-01-05 NOTE — Telephone Encounter (Signed)
Pt needs test strips sent in by [facility] certified provider.  Permission received from Dr. Ardelia Mems to resend under her name. Fleeger, Salome Spotted, CMA

## 2018-01-10 MED ORDER — GLUCOSE BLOOD VI STRP: ORAL_STRIP | 12 refills | Status: DC

## 2018-01-10 NOTE — Telephone Encounter (Signed)
[name] because of medicare Part B, but they need an original so insurance will pay.   [facility] [address]. [name] Long, [name] Long, CMA

## 2018-01-10 NOTE — Addendum Note (Signed)
Addended by: Christen Bame D on: 01/10/2018 04:00 PM   Modules accepted: Orders

## 2018-01-29 ENCOUNTER — Other Ambulatory Visit: Payer: Self-pay | Admitting: *Deleted

## 2018-01-30 MED ORDER — OMEPRAZOLE 20 MG PO CPDR: 20.0000 mg | DELAYED_RELEASE_CAPSULE | Freq: Every day | ORAL | 1 refills | Status: DC

## 2018-01-31 ENCOUNTER — Ambulatory Visit: Payer: Medicare Other | Admitting: Family Medicine

## 2018-02-16 ENCOUNTER — Ambulatory Visit
Admission: RE | Admit: 2018-02-16 | Discharge: 2018-02-16 | Disposition: A | Discharge status: Home / Self Care | Payer: Medicare Other | Source: Ambulatory Visit | Attending: Family Medicine | Admitting: Family Medicine

## 2018-02-16 DIAGNOSIS — M85851 Other specified disorders of bone density and structure, right thigh: Secondary | ICD-10-CM | POA: Diagnosis not present

## 2018-02-16 DIAGNOSIS — M85852 Other specified disorders of bone density and structure, left thigh: Secondary | ICD-10-CM | POA: Diagnosis not present

## 2018-02-16 DIAGNOSIS — Z1239 Encounter for other screening for malignant neoplasm of breast: Secondary | ICD-10-CM

## 2018-02-16 DIAGNOSIS — Z1231 Encounter for screening mammogram for malignant neoplasm of breast: Secondary | ICD-10-CM | POA: Diagnosis not present

## 2018-02-16 DIAGNOSIS — Z780 Asymptomatic menopausal state: Secondary | ICD-10-CM | POA: Diagnosis not present

## 2018-02-16 DIAGNOSIS — E2839 Other primary ovarian failure: Secondary | ICD-10-CM

## 2018-02-19 ENCOUNTER — Other Ambulatory Visit: Payer: Self-pay | Admitting: Family Medicine

## 2018-02-19 DIAGNOSIS — R928 Other abnormal and inconclusive findings on diagnostic imaging of breast: Secondary | ICD-10-CM

## 2018-02-23 ENCOUNTER — Encounter: Payer: Self-pay | Admitting: Family Medicine

## 2018-02-23 ENCOUNTER — Other Ambulatory Visit: Payer: Self-pay

## 2018-02-23 ENCOUNTER — Ambulatory Visit (INDEPENDENT_AMBULATORY_CARE_PROVIDER_SITE_OTHER): Payer: Medicare Other | Admitting: Family Medicine

## 2018-02-23 VITALS — BP 118/68 | HR 74 | Temp 98.1°F | Ht 65.0 in | Wt 162.4 lb

## 2018-02-23 DIAGNOSIS — Z1211 Encounter for screening for malignant neoplasm of colon: Secondary | ICD-10-CM | POA: Diagnosis not present

## 2018-02-23 DIAGNOSIS — N182 Chronic kidney disease, stage 2 (mild): Secondary | ICD-10-CM | POA: Diagnosis not present

## 2018-02-23 DIAGNOSIS — Z23 Encounter for immunization: Secondary | ICD-10-CM | POA: Diagnosis not present

## 2018-02-23 DIAGNOSIS — G8929 Other chronic pain: Secondary | ICD-10-CM

## 2018-02-23 DIAGNOSIS — M545 Low back pain, unspecified: Secondary | ICD-10-CM

## 2018-02-23 DIAGNOSIS — E7800 Pure hypercholesterolemia, unspecified: Secondary | ICD-10-CM | POA: Diagnosis not present

## 2018-02-23 DIAGNOSIS — E559 Vitamin D deficiency, unspecified: Secondary | ICD-10-CM

## 2018-02-23 MED ORDER — TRAMADOL HCL 50 MG PO TABS: 50.0000 mg | ORAL_TABLET | Freq: Three times a day (TID) | ORAL | 0 refills | Status: DC | PRN

## 2018-02-23 NOTE — Assessment & Plan Note (Signed)
Will obtain BMP today.  Patient will need to continue abstaining from NSAIDs.

## 2018-02-23 NOTE — Assessment & Plan Note (Signed)
We will measure her vitamin D level today.  We will need to optimize patient's calcium and vitamin D levels in the setting of osteopenia.

## 2018-02-23 NOTE — Assessment & Plan Note (Signed)
Referral sent for colonoscopy today.

## 2018-02-23 NOTE — Assessment & Plan Note (Addendum)
Prescribe 30 tablets of tramadol 50 mg for pain.  This is an ongoing issue for this patient, and I think that tramadol is a good solution for now, since patient is satisfied with the relief it brings and is not asking for other narcotics.

## 2018-02-23 NOTE — Progress Notes (Signed)
Subjective:    [name] Long - 67 y.o. female MRN [id number]  Date of birth: 09-Jun-1951  HPI  [name] Long is here for chronic pain.  Chronic musculoskeletal pain - going on for three years - "takes her breath away" - tramadol 50 mg one half tablet helps the pain - located in bilateral feet and knees as well as bilateral shoulders - also feels like her neck is getting weak - can only sit up for about five hours and has to cook on a stool - has to sleep in a chair - has been smoking cigarettes to help relieve the pain  Mammogram results -Patient is concerned that she may have cancer in her right breast -Was told that she needs further work-up for mammogram findings -Reports some pain in the upper outer quadrant of the right breast   Health Maintenance:  - will need follow up of mammogram.  Mole to getting a colonoscopy after discussion of risks and benefits of this procedure.  She is also amenable to follow-up for her DEXA scan, which showed a T score of -2.2 in one femur measurement.  We used a flu shot today Health Maintenance Due  Topic Date Due  . COLONOSCOPY  07/24/2001  . LIPID PANEL  10/25/2017  . INFLUENZA VACCINE  02/01/2018    -  reports that she has been smoking cigarettes. She has a 22.50 pack-year smoking history. She has never used smokeless tobacco. - Review of Systems: Per HPI. - Past Medical History: Patient Active Problem List   Diagnosis Date Noted  . Colon cancer screening 02/23/2018  . Tooth pain 10/08/2017  . Diarrhea 10/06/2017  . New onset atrial flutter ([place]) 07/10/2017  . Atrial flutter with rapid ventricular response ([place]) 07/10/2017  . Atrial fibrillation ([place]) 07/05/2017  . COPD (chronic obstructive pulmonary disease) ([place]) 01/22/2017  . Shortness of breath 12/25/2016  . Pedal edema   . Acute on chronic congestive heart failure ([place])   . Irregular heart beat   . Chronic bilateral low back pain without sciatica 08/15/2016  .  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 Impingement syndrome of right shoulder 08/15/2016  . Schizoaffective disorder, bipolar type ([place])   . URI (upper respiratory infection) 03/09/2016  . Muscle strain of chest wall 10/21/2015  . Activities of daily living deficit involving bathing of lower body 08/10/2015  . Hypokalemia 01/22/2015  . Vitamin D deficiency 12/19/2014  . Preventative health care 03/21/2014  . Bronchitis, chronic obstructive w acute bronchitis ([place]) 01/07/2014  . Osteoarthritis, multiple sites 06/08/2011  . Vaginal discharge 01/14/2011  . COPD exacerbation ([place]) 09/23/2010  . Fall 09/23/2010  . CHRONIC KIDNEY DISEASE STAGE II (M[place]) 09/14/2009  . Hypothyroidism 08/31/2006  . Type 2 diabetes mellitus ([place]) 08/31/2006  . HYPERCHOLESTEROLEMIA 08/31/2006  . Tobacco use disorder 08/31/2006  . HYPERTENSION, BENIGN SYSTEMIC 08/31/2006   - Medications: reviewed and updated   Objective:   Physical Exam BP 118/68   Pulse 74   Temp 98.1 F (36.7 C) (Oral)   Ht 5\' 5"  (1.651 m)   Wt 162 lb 6.4 oz (73.7 kg)   SpO2 97%   BMI 27.02 kg/m  Gen: NAD, alert, cooperative with exam, well-appearing CV: RRR, good S1/S2, no murmur Resp: CTABL, no wheezes, non-labored Abd: SNTND, BS present, no guarding or organomegaly Skin: Nodular area that is mildly tender in bilateral axillae, but especially in right axilla Neuro: no gross deficits.  Psych: Tangential speech,  poor insight and judgment        Assessment & Plan:   Chronic bilateral low back pain without sciatica Prescribe 30 tablets of tramadol 50 mg for pain.  This is an ongoing issue for this patient, and I think that tramadol is a good solution for now, since patient is satisfied with the relief it brings and is not asking for other narcotics.  Colon cancer screening Referral sent for colonoscopy today.  Vitamin D deficiency We will measure her vitamin D level  today.  We will need to optimize patient's calcium and vitamin D levels in the setting of osteopenia.  HYPERCHOLESTEROLEMIA Will obtain lipid panel today.  CHRONIC KIDNEY DISEASE STAGE II (M[place]) Will obtain BMP today.  Patient will need to continue abstaining from NSAIDs.    Maia Breslow, M.D. 02/23/2018, 5:26 PM PGY-2, [place]

## 2018-02-23 NOTE — Assessment & Plan Note (Signed)
Will obtain lipid panel today.

## 2018-02-23 NOTE — Patient Instructions (Addendum)
It was nice seeing you today [name] Long!  Today, we are getting several labs today.  I will call you if these are abnormal.  I will also let you know if we need to add any medications or vitamins.   Please come back in one month for a check up on your diabetes.  If you have any questions or concerns, please feel free to call the clinic.   Be well,  Dr. Shan Levans

## 2018-02-24 LAB — BASIC METABOLIC PANEL
BUN/Creatinine Ratio: 9 — ABNORMAL LOW (ref 12–28)
BUN: 9 mg/dL (ref 8–27)
CO2: 28 mmol/L (ref 20–29)
Calcium: 9.3 mg/dL (ref 8.7–10.3)
Chloride: 89 mmol/L — ABNORMAL LOW (ref 96–106)
Creatinine, Ser: 0.98 mg/dL (ref 0.57–1.00)
GFR calc Af Amer: 70 mL/min/{1.73_m2} (ref >59)
GFR calc non Af Amer: 60 mL/min/{1.73_m2} (ref >59)
Glucose: 122 mg/dL — ABNORMAL HIGH (ref 65–99)
Potassium: 3.2 mmol/L — ABNORMAL LOW (ref 3.5–5.2)
Sodium: 134 mmol/L (ref 134–144)

## 2018-02-24 LAB — LIPID PANEL
Chol/HDL Ratio: 5.7 ratio — ABNORMAL HIGH (ref 0.0–4.4)
Cholesterol, Total: 215 mg/dL — ABNORMAL HIGH (ref 100–199)
HDL: 38 mg/dL — ABNORMAL LOW (ref >39)
LDL Calculated: 127 mg/dL — ABNORMAL HIGH (ref 0–99)
Triglycerides: 248 mg/dL — ABNORMAL HIGH (ref 0–149)
VLDL Cholesterol Cal: 50 mg/dL — ABNORMAL HIGH (ref 5–40)

## 2018-02-24 LAB — VITAMIN D 25 HYDROXY (VIT D DEFICIENCY, FRACTURES): Vit D, 25-Hydroxy: 43 ng/mL (ref 30.0–100.0)

## 2018-02-26 ENCOUNTER — Telehealth: Payer: Self-pay | Admitting: Family Medicine

## 2018-02-26 ENCOUNTER — Other Ambulatory Visit: Payer: Self-pay | Admitting: Family Medicine

## 2018-02-26 MED ORDER — ROSUVASTATIN CALCIUM 20 MG PO TABS: 20.0000 mg | ORAL_TABLET | Freq: Every day | ORAL | 11 refills | Status: DC

## 2018-02-26 NOTE — Telephone Encounter (Signed)
Pt was told by pharmacy Crestor is no approved medicantion. Please advsise

## 2018-02-26 NOTE — Telephone Encounter (Signed)
Called to inform patient that her cholesterol levels were high, and that I recommend stopping pravastatin and starting rosuvastatin 20 mg.  Patient has had body aches in response to atorvastatin, so I am hoping that she will tolerate Crestor better.  Patient was agreeable to this plan.  Also discussed that her bone density was not severe enough to warrant starting a new medication right now, and that we will need to remeasure her bone density in 1-2 years.  Patient was amenable to this plan.

## 2018-02-28 NOTE — Telephone Encounter (Signed)
Pt called again requesting [facility] call her pharmacy to give them authorization for her Crestor. Pt said her medicaid and medicare does not cover it. Pt said she has been without he cholesterol medication for 2 days now since she is not able to get it from the pharmacy until [place] calls them. Pt would like to know if [facility]  can take care of this while she is in the office this afternoon. Please advise.

## 2018-03-01 ENCOUNTER — Ambulatory Visit
Admission: RE | Admit: 2018-03-01 | Discharge: 2018-03-01 | Disposition: A | Discharge status: Home / Self Care | Payer: Medicare Other | Source: Ambulatory Visit | Attending: Family Medicine | Admitting: Family Medicine

## 2018-03-01 ENCOUNTER — Other Ambulatory Visit: Payer: Self-pay | Admitting: Family Medicine

## 2018-03-01 DIAGNOSIS — R928 Other abnormal and inconclusive findings on diagnostic imaging of breast: Secondary | ICD-10-CM

## 2018-03-01 DIAGNOSIS — N6489 Other specified disorders of breast: Secondary | ICD-10-CM | POA: Diagnosis not present

## 2018-03-01 NOTE — Telephone Encounter (Signed)
I called patient's pharmacy, and they said that a prior authorization will need to be filled out for patient's insurance to cover Crestor.  Since she needs a high intensity statin and is allergic to atorvastatin, this is the most appropriate medication for her.

## 2018-03-02 ENCOUNTER — Other Ambulatory Visit: Payer: Self-pay

## 2018-03-02 NOTE — Telephone Encounter (Signed)
PA needed on Rosuvastatin. Clinical questions submitted via Cover My Meds. Waiting on response, could take up to 72 hours.  Cover My Meds info: Key: [id number]  Danley Danker, RN [facility] RN)

## 2018-03-06 NOTE — Telephone Encounter (Signed)
Rosuvastatin approved through 07-03-18. [facility].  Danley Danker, RN [facility] RN)

## 2018-03-09 ENCOUNTER — Telehealth: Payer: Self-pay | Admitting: Family Medicine

## 2018-03-09 NOTE — Telephone Encounter (Signed)
This problem has been chronic but seems to be worsening.  I told patient that she needs to be examined before any fluid pills were prescribed.  In particular, she would need a BMP and possibly further workup of possible heart failure due to her swelling.  She expressed understanding and will make an appointment.

## 2018-03-09 NOTE — Telephone Encounter (Signed)
Pt would like for Dr. Shan Levans to call her to discuss the swelling in her legs and feet. She wants to know if it is safe for her to wait until her next appointment scheduled or if Dr. Shan Levans could write her a prescription for fluid pills.

## 2018-03-14 ENCOUNTER — Ambulatory Visit (INDEPENDENT_AMBULATORY_CARE_PROVIDER_SITE_OTHER): Payer: Medicare Other | Admitting: Family Medicine

## 2018-03-14 ENCOUNTER — Encounter: Payer: Self-pay | Admitting: Family Medicine

## 2018-03-14 ENCOUNTER — Other Ambulatory Visit: Payer: Self-pay

## 2018-03-14 VITALS — BP 114/72 | HR 85 | Temp 98.3°F | Ht 65.0 in | Wt 165.6 lb

## 2018-03-14 DIAGNOSIS — R300 Dysuria: Secondary | ICD-10-CM

## 2018-03-14 DIAGNOSIS — M7989 Other specified soft tissue disorders: Secondary | ICD-10-CM | POA: Diagnosis not present

## 2018-03-14 HISTORY — DX: Other specified soft tissue disorders: M79.89

## 2018-03-14 LAB — POCT URINALYSIS DIP (MANUAL ENTRY)
Bilirubin, UA: NEGATIVE
Blood, UA: NEGATIVE
Glucose, UA: NEGATIVE mg/dL
Ketones, POC UA: NEGATIVE mg/dL
Leukocytes, UA: NEGATIVE
Nitrite, UA: NEGATIVE
Spec Grav, UA: 1.01 (ref 1.010–1.025)
Urobilinogen, UA: 0.2 E.U./dL
pH, UA: 6.5 (ref 5.0–8.0)
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
## 2018-03-14 NOTE — Patient Instructions (Addendum)
It was nice to meet you today,   I don't think you have to worry about the swelling on your legs. I did not notice much swelling at all on my exam.  You recently had your heart looked at and they didn't find anything concerning.  And you recently had lab work done that showed your kidneys were still working normally.  You can buy some support stockings at the pharmacy if you feel like your legs are still swelling.   We checked your urine for a UTI and it showed showed no sign of a UTI, so we do not need to prescribe any antibiotics today.  If you start to have a fever or have nausea or vomiting please come back to the hospital for evaluation.    Thank you,   Clemetine Marker, MD

## 2018-03-14 NOTE — Assessment & Plan Note (Signed)
Patient endorsed leg swelling and tenderness.  No edema seen on exam in either leg.  Patient verbalizes tenderness to palpation bilaterally on the calves, but this was mild at most.  Recent bmp showed creatinine < 1 and recent echo showed no evidence of heart failure.  Any swelling noted by patient is likely due to venous insufficiency and patient was advised to wear compression socks.

## 2018-03-15 ENCOUNTER — Ambulatory Visit (INDEPENDENT_AMBULATORY_CARE_PROVIDER_SITE_OTHER): Payer: Medicare Other | Admitting: Orthopedic Surgery

## 2018-03-27 ENCOUNTER — Ambulatory Visit: Payer: Medicare Other | Admitting: Family Medicine

## 2018-03-27 ENCOUNTER — Other Ambulatory Visit: Payer: Self-pay | Admitting: Family Medicine

## 2018-03-27 DIAGNOSIS — M545 Low back pain, unspecified: Secondary | ICD-10-CM

## 2018-03-27 DIAGNOSIS — G8929 Other chronic pain: Secondary | ICD-10-CM

## 2018-03-27 NOTE — Telephone Encounter (Signed)
Pt is calling because she needs refills on her Tramadol, Levothyroxine , Potassium, Vitamin D jw

## 2018-03-28 MED ORDER — LEVOTHYROXINE SODIUM 50 MCG PO TABS: 50.0000 ug | ORAL_TABLET | Freq: Every day | ORAL | 3 refills | Status: DC

## 2018-03-28 MED ORDER — TRAMADOL HCL 50 MG PO TABS: 50.0000 mg | ORAL_TABLET | Freq: Three times a day (TID) | ORAL | 0 refills | Status: DC | PRN

## 2018-03-29 ENCOUNTER — Encounter (INDEPENDENT_AMBULATORY_CARE_PROVIDER_SITE_OTHER): Payer: Self-pay | Admitting: Orthopedic Surgery

## 2018-03-29 ENCOUNTER — Ambulatory Visit (INDEPENDENT_AMBULATORY_CARE_PROVIDER_SITE_OTHER): Payer: Medicare Other

## 2018-03-29 ENCOUNTER — Ambulatory Visit (INDEPENDENT_AMBULATORY_CARE_PROVIDER_SITE_OTHER): Payer: Medicare Other | Admitting: Orthopedic Surgery

## 2018-03-29 VITALS — Ht 65.0 in | Wt 165.5 lb

## 2018-03-29 DIAGNOSIS — M545 Low back pain: Secondary | ICD-10-CM

## 2018-03-29 DIAGNOSIS — M170 Bilateral primary osteoarthritis of knee: Secondary | ICD-10-CM

## 2018-03-29 DIAGNOSIS — M542 Cervicalgia: Secondary | ICD-10-CM

## 2018-03-29 DIAGNOSIS — M7541 Impingement syndrome of right shoulder: Secondary | ICD-10-CM

## 2018-03-29 DIAGNOSIS — M7542 Impingement syndrome of left shoulder: Secondary | ICD-10-CM | POA: Diagnosis not present

## 2018-03-29 MED ORDER — PREDNISONE 10 MG PO TABS: 20.0000 mg | ORAL_TABLET | Freq: Every day | ORAL | 0 refills | Status: DC

## 2018-04-02 ENCOUNTER — Encounter (INDEPENDENT_AMBULATORY_CARE_PROVIDER_SITE_OTHER): Payer: Self-pay | Admitting: Orthopedic Surgery

## 2018-04-02 DIAGNOSIS — M542 Cervicalgia: Secondary | ICD-10-CM | POA: Diagnosis not present

## 2018-04-02 DIAGNOSIS — M170 Bilateral primary osteoarthritis of knee: Secondary | ICD-10-CM | POA: Diagnosis not present

## 2018-04-02 DIAGNOSIS — M545 Low back pain: Secondary | ICD-10-CM | POA: Diagnosis not present

## 2018-04-02 DIAGNOSIS — M7541 Impingement syndrome of right shoulder: Secondary | ICD-10-CM | POA: Diagnosis not present

## 2018-04-02 DIAGNOSIS — M7542 Impingement syndrome of left shoulder: Secondary | ICD-10-CM | POA: Diagnosis not present

## 2018-04-02 NOTE — Progress Notes (Signed)
Office Visit Note   Patient: [name] Long           Date of Birth: Oct 05, 1950           MRN: [id number] Visit Date: 03/29/2018              Requested by: Kathrene Alu, MD [address], [place] [address] PCP: Kathrene Alu, MD  Chief Complaint  Patient presents with  . Spine - Follow-up  . Neck - Follow-up      HPI: Patient is a 67 year old woman who presents complaining of bilateral shoulder pain bilateral knee pain neck and back pain.  Patient states that she has completed physical therapy she states that the last injection for her left shoulder provided her no relief.  She ambulates with a cane.  She states that her neck is getting weak and stiff complains of numbness.  She also complains of chronic lower back pain.  Patient also complains of bilateral knee pain.  Assessment & Plan: Visit Diagnoses:  1. Neck pain   2. Acute bilateral low back pain, with sciatica presence unspecified   3. Bilateral primary osteoarthritis of knee   4. Impingement syndrome of right shoulder   5. Impingement syndrome of left shoulder     Plan: Discussed with the patient to help treat the neck and back symptoms we could try a prednisone Dosepak she is called in a prescription for prednisone.  Recommend the importance of smoking cessation therapy exercise.  Reevaluate in 3 weeks.  Follow-Up Instructions: Return in about 3 weeks (around 04/19/2018).   Ortho Exam  Patient is alert, oriented, no adenopathy, well-dressed, normal affect, normal respiratory effort. Examination patient ambulates with a cane.  She has an antalgic gait.  She has no thoracic outlet tenderness she has pain with range of motion of both shoulder she has no focal motor weakness in either upper extremity.  There is negative straight leg raise bilaterally in her lower extremities and no focal motor weakness in either lower extremity.  Both knees are tender to palpation of the medial and lateral joint  lines and have crepitation with range of motion.  Imaging: No results found. No images are attached to the encounter.  Labs: Lab Results  Component Value Date   HGBA1C 7.7 (A) 12/25/2017   HGBA1C 8.1 (H) 07/10/2017   HGBA1C 8.7 06/05/2017   LABORGA NO GROWTH 12/04/2014     Lab Results  Component Value Date   ALBUMIN 3.3 (L) 12/06/2017   ALBUMIN 2.8 (L) 07/10/2017   ALBUMIN 3.0 (L) 12/26/2016    Body mass index is 27.54 kg/m.  Orders:  Orders Placed This Encounter  Procedures  . XR Cervical Spine 2 or 3 views  . XR Lumbar Spine 2-3 Views   Meds ordered this encounter  Medications  . predniSONE (DELTASONE) 10 MG tablet    Sig: Take 2 tablets (20 mg total) by mouth daily with breakfast.    Dispense:  60 tablet    Refill:  0     Procedures: Large Joint Inj: bilateral knee on 04/02/2018 3:37 PM Indications: pain and diagnostic evaluation Details: 22 G 1.5 in needle, anteromedial approach  Arthrogram: No  Outcome: tolerated well, no immediate complications Procedure, treatment alternatives, risks and benefits explained, specific risks discussed. Consent was given by the patient. Immediately prior to procedure a time out was called to verify the correct patient, procedure, equipment, support staff and site/side marked as required. Patient was prepped and  draped in the usual sterile fashion.      Clinical Data: No additional findings.  ROS:  All other systems negative, except as noted in the HPI. Review of Systems  Objective: Vital Signs: Ht _0  (1.651 m)   Wt 165 lb 8 oz (75.1 kg)   BMI 27.54 kg/m   Specialty Comments:  No specialty comments available.  PMFS History: Patient Active Problem List   Diagnosis Date Noted  . Colon cancer screening 02/23/2018  . Tooth pain 10/08/2017  . Diarrhea 10/06/2017  . New onset atrial flutter ([place]) 07/10/2017  . Atrial flutter with rapid ventricular response ([place]) 07/10/2017  . Atrial fibrillation ([place])  07/05/2017  . COPD (chronic obstructive pulmonary disease) ([place]) 01/22/2017  . Shortness of breath 12/25/2016  . Pedal edema   . Acute on chronic congestive heart failure ([place])   . Irregular heart beat   . Chronic bilateral low back pain without sciatica 08/15/2016  . 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 Impingement syndrome of right shoulder 08/15/2016  . Schizoaffective disorder, bipolar type ([place])   . URI (upper respiratory infection) 03/09/2016  . Muscle strain of chest wall 10/21/2015  . Activities of daily living deficit involving bathing of lower body 08/10/2015  . Hypokalemia 01/22/2015  . Vitamin D deficiency 12/19/2014  . Preventative health care 03/21/2014  . Bronchitis, chronic obstructive w acute bronchitis ([place]) 01/07/2014  . Osteoarthritis, multiple sites 06/08/2011  . Vaginal discharge 01/14/2011  . COPD exacerbation ([place]) 09/23/2010  . Fall 09/23/2010  . CHRONIC KIDNEY DISEASE STAGE II ([place]) 09/14/2009  . Hypothyroidism 08/31/2006  . Type 2 diabetes mellitus ([place]) 08/31/2006  . HYPERCHOLESTEROLEMIA 08/31/2006  . Tobacco use disorder 08/31/2006  . HYPERTENSION, BENIGN SYSTEMIC 08/31/2006   Past Medical History:  Diagnosis Date  . Arthritis    "real bad; all over" (07/12/2017)  . Asthma   . BIPOLAR DISORDER 08/31/2006   Qualifier: Diagnosis of  By: Dorathy Daft MD, Marjory Lies    . CHRONIC KIDNEY DISEASE STAGE II ([place]) 09/14/2009   Annotation: eGFR 64 Qualifier: Diagnosis of  By: Jess Barters MD, [name]    . Chronic lower back pain   . COPD (chronic obstructive pulmonary disease) ([place]) 09/23/2010   Diagnosed at [facility] (Dr. Annamaria Boots)   . DM (diabetes mellitus) type II controlled with renal manifestation ([place]) 08/31/2006   Qualifier: Diagnosis of  By: Dorathy Daft MD, Marjory Lies    . Dyspnea    "all my life; since 6th grade" (07/12/2017)  . HYPERCHOLESTEROLEMIA 08/31/2006   Intolerance to Lipitor OK on Crestor but medicaid  no longer covering    . HYPERTENSION, BENIGN SYSTEMIC 08/31/2006   Qualifier: Diagnosis of  By: Dorathy Daft MD, Marjory Lies    . HYPOTHYROIDISM, UNSPECIFIED 08/31/2006   Qualifier: Diagnosis of  By: Dorathy Daft MD, Marjory Lies    . Leg swelling 03/14/2018  . Pneumonia    "3 times" (07/12/2017)  . Scoliosis     Family History  Problem Relation Age of Onset  . Breast cancer Sister     Past Surgical History:  Procedure Laterality Date  . CESAREAN SECTION    . FOOT FRACTURE SURGERY Right    "steel plate in it"  . FOOT SURGERY     " born w/dislocated foot"  . FRACTURE SURGERY    . KNEE ARTHROSCOPY Right   . TOE SURGERY Bilateral    "both pinky toes"  . TONSILLECTOMY AND ADENOIDECTOMY     Social History   Occupational  History  . Not on file  Tobacco Use  . Smoking status: Current Every Day Smoker    Packs/day: 0.50    Years: 45.00    Pack years: 22.50    Types: Cigarettes  . Smokeless tobacco: Never Used  Substance and Sexual Activity  . Alcohol use: Yes    Comment: 07/12/2017 "stopped 2 yr ago; did have a drink over the holidays recently"  . Drug use: No  . Sexual activity: Yes

## 2018-04-04 ENCOUNTER — Telehealth: Payer: Self-pay | Admitting: Family Medicine

## 2018-04-04 NOTE — Telephone Encounter (Signed)
Patient needs a form (yearly form that we have done before) stating she has a clean bill of health and does not have any schizophrenic or bipolar disorders.  Patient states that it was same form that Dr. Rosalyn Gess did for her before, incorrectly stating she was schizophrenic and bipolar.  Send to [address], [address], [place], [place] [address] or fax to [phone]  Best number to contact if you need is, [phone]

## 2018-04-06 NOTE — Telephone Encounter (Signed)
Patient will need to get the letter addressing her mental health from [facility].  She can also have them send me their assessment of her mental health, and if they say she does not have these disorders, I can write the letter saying she does not have these conditions.  Since bipolar disorder is listed on her problem list in her chart and she has been on antipsychotic and mood stabilizing medications in the past, I will only supply a letter saying that she does have bipolar disorder and schizophrenia.

## 2018-04-06 NOTE — Telephone Encounter (Signed)
Informed patient about needing letter from [facility], however, patient says she needs letter from her medical doctor (PCP) only.  Please call and let her know if she needs appointment with a faculty doctor to get this resolved, thanks.  Best number to call is  [phone].Marland Kitchen  Patient has been taking meds for many years for her stress.  She needs this taken care of asap and she feels that Dr. Rosalyn Gess did not diagnose her properly, or absolutely wrong actually.  Please call her today, thanks.

## 2018-04-06 NOTE — Telephone Encounter (Signed)
LVM to let pt know she will need to call [facility] to get the letter. If she calls back, please reiterate to her as we can not provide letter. Ottis Stain, CMA

## 2018-04-06 NOTE — Telephone Encounter (Signed)
Unfortunately, I am not the most appropriate clinician to comment on her mental health diagnoses as I have not treated seen her for these issues.  She has seen [facility] in the past, and she should ask them to write this letter.  If she needs a letter addressing her non-mental health concerns, I am happy to supply that.

## 2018-04-10 DIAGNOSIS — F209 Schizophrenia, unspecified: Secondary | ICD-10-CM | POA: Diagnosis not present

## 2018-04-11 ENCOUNTER — Ambulatory Visit (INDEPENDENT_AMBULATORY_CARE_PROVIDER_SITE_OTHER): Payer: Medicare Other | Admitting: Orthopedic Surgery

## 2018-04-11 ENCOUNTER — Encounter (INDEPENDENT_AMBULATORY_CARE_PROVIDER_SITE_OTHER): Payer: Self-pay | Admitting: Orthopedic Surgery

## 2018-04-11 ENCOUNTER — Other Ambulatory Visit: Payer: Self-pay

## 2018-04-11 VITALS — Ht 65.0 in | Wt 165.0 lb

## 2018-04-11 DIAGNOSIS — M7541 Impingement syndrome of right shoulder: Secondary | ICD-10-CM | POA: Diagnosis not present

## 2018-04-11 DIAGNOSIS — M7542 Impingement syndrome of left shoulder: Secondary | ICD-10-CM | POA: Diagnosis not present

## 2018-04-11 DIAGNOSIS — M7501 Adhesive capsulitis of right shoulder: Secondary | ICD-10-CM

## 2018-04-11 DIAGNOSIS — M7502 Adhesive capsulitis of left shoulder: Secondary | ICD-10-CM

## 2018-04-11 NOTE — Progress Notes (Signed)
Office Visit Note   Patient: [name] Long           Date of Birth: [birth year]           MRN: [id number] Visit Date: 04/11/2018              Requested by: Kathrene Alu, MD [address], [place] [address] PCP: Kathrene Alu, MD  Chief Complaint  Patient presents with  . Left Shoulder - Pain  . Right Shoulder - Pain      HPI: Patient is a 67 year old woman who presents with recurrent impingement symptoms of both shoulder she states she is having decreasing range of motion of the right shoulder complains of crepitation and grinding.  Patient states that she has not used the prednisone she states that this cause problems with her legs.  Her last injection was in June for the left shoulder.  Patient complains of difficulty with activities of daily living.  Assessment & Plan: Visit Diagnoses:  1. Impingement syndrome of right shoulder   2. Impingement syndrome of left shoulder   3. Adhesive capsulitis of both shoulders     Plan: Both shoulders were injected without complications.  Follow-Up Instructions: Return if symptoms worsen or fail to improve.   Ortho Exam  Patient is alert, oriented, no adenopathy, well-dressed, normal affect, normal respiratory effort. Examination patient has abduction and flexion of the right shoulder to only 70 degrees.  She has internal and external rotation of 20 degrees he has pain with Neer and Hawkins impingement test pain with a drop arm test.  Examination of the left shoulder she has abduction and flexion to 90 degrees she has internal and external rotation about 30 degrees on the left shoulder.  Pain with Neer and Hawkins impingement test pain with a drop arm test.  Patient's most recent hemoglobin A1c is 7.7 her most recent albumin is 3.3.  Imaging: No results found. No images are attached to the encounter.  Labs: Lab Results  Component Value Date   HGBA1C 7.7 (A) 12/25/2017   HGBA1C 8.1 (H) 07/10/2017   HGBA1C 8.7  06/05/2017   LABORGA NO GROWTH 12/04/2014     Lab Results  Component Value Date   ALBUMIN 3.3 (L) 12/06/2017   ALBUMIN 2.8 (L) 07/10/2017   ALBUMIN 3.0 (L) 12/26/2016    Body mass index is 27.46 kg/m.  Orders:  Orders Placed This Encounter  Procedures  . Large Joint Inj   No orders of the defined types were placed in this encounter.    Procedures: Large Joint Inj: bilateral subacromial bursa on 04/11/2018 3:43 PM Indications: diagnostic evaluation and pain Details: 22 G 1.5 in needle, posterior approach  Arthrogram: No  Outcome: tolerated well, no immediate complications Procedure, treatment alternatives, risks and benefits explained, specific risks discussed. Consent was given by the patient. Immediately prior to procedure a time out was called to verify the correct patient, procedure, equipment, support staff and site/side marked as required. Patient was prepped and draped in the usual sterile fashion.      Clinical Data: No additional findings.  ROS:  All other systems negative, except as noted in the HPI. Review of Systems  Objective: Vital Signs: Ht _0  (1.651 m)   Wt 165 lb (74.8 kg)   BMI 27.46 kg/m   Specialty Comments:  No specialty comments available.  PMFS History: Patient Active Problem List   Diagnosis Date Noted  . Colon cancer screening 02/23/2018  . Tooth  pain 10/08/2017  . Diarrhea 10/06/2017  . New onset atrial flutter ([place]) 07/10/2017  . Atrial flutter with rapid ventricular response ([place]) 07/10/2017  . Atrial fibrillation ([place]) 07/05/2017  . COPD (chronic obstructive pulmonary disease) ([place]) 01/22/2017  . Shortness of breath 12/25/2016  . Pedal edema   . Acute on chronic congestive heart failure ([place])   . Irregular heart beat   . Chronic bilateral low back pain without sciatica 08/15/2016  . 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 Impingement syndrome of right shoulder  08/15/2016  . Schizoaffective disorder, bipolar type ([place])   . URI (upper respiratory infection) 03/09/2016  . Muscle strain of chest wall 10/21/2015  . Activities of daily living deficit involving bathing of lower body 08/10/2015  . Hypokalemia 01/22/2015  . Vitamin D deficiency 12/19/2014  . Preventative health care 03/21/2014  . Bronchitis, chronic obstructive w acute bronchitis ([place]) 01/07/2014  . Osteoarthritis, multiple sites 06/08/2011  . Vaginal discharge 01/14/2011  . COPD exacerbation ([place]) 09/23/2010  . Fall 09/23/2010  . CHRONIC KIDNEY DISEASE STAGE II ([facility]) 09/14/2009  . Hypothyroidism 08/31/2006  . Type 2 diabetes mellitus ([place]) 08/31/2006  . HYPERCHOLESTEROLEMIA 08/31/2006  . Tobacco use disorder 08/31/2006  . HYPERTENSION, BENIGN SYSTEMIC 08/31/2006   Past Medical History:  Diagnosis Date  . Arthritis    "real bad; all over" (07/12/2017)  . Asthma   . BIPOLAR DISORDER 08/31/2006   Qualifier: Diagnosis of  By: Dorathy Daft MD, Marjory Lies    . CHRONIC KIDNEY DISEASE STAGE II ([facility]) 09/14/2009   Annotation: eGFR 64 Qualifier: Diagnosis of  By: Jess Barters MD, [name]    . Chronic lower back pain   . COPD (chronic obstructive pulmonary disease) ([place]) 09/23/2010   Diagnosed at [facility] in 2008 (Dr. Annamaria Boots)   . DM (diabetes mellitus) type II controlled with renal manifestation ([place]) 08/31/2006   Qualifier: Diagnosis of  By: Dorathy Daft MD, Marjory Lies    . Dyspnea    "all my life; since 6th grade" (07/12/2017)  . HYPERCHOLESTEROLEMIA 08/31/2006   Intolerance to Lipitor OK on Crestor but medicaid no longer covering    . HYPERTENSION, BENIGN SYSTEMIC 08/31/2006   Qualifier: Diagnosis of  By: Dorathy Daft MD, Marjory Lies    . HYPOTHYROIDISM, UNSPECIFIED 08/31/2006   Qualifier: Diagnosis of  By: Dorathy Daft MD, Marjory Lies    . Leg swelling 03/14/2018  . Pneumonia    "3 times" (07/12/2017)  . Scoliosis     Family History  Problem Relation Age of Onset  . Breast cancer Sister     Past Surgical History:    Procedure Laterality Date  . CESAREAN SECTION    . FOOT FRACTURE SURGERY Right    "steel plate in it"  . FOOT SURGERY     " born w/dislocated foot"  . FRACTURE SURGERY    . KNEE ARTHROSCOPY Right   . TOE SURGERY Bilateral    "both pinky toes"  . TONSILLECTOMY AND ADENOIDECTOMY     Social History   Occupational History  . Not on file  Tobacco Use  . Smoking status: Current Every Day Smoker    Packs/day: 0.50    Years: 45.00    Pack years: 22.50    Types: Cigarettes  . Smokeless tobacco: Never Used  Substance and Sexual Activity  . Alcohol use: Yes    Comment: 07/12/2017 "stopped 2 yr ago; did have a drink over the holidays recently"  . Drug use: No  . Sexual activity: Yes

## 2018-04-12 MED ORDER — AMLODIPINE BESYLATE 5 MG PO TABS: 5.0000 mg | ORAL_TABLET | Freq: Every day | ORAL | 11 refills | Status: DC

## 2018-04-13 ENCOUNTER — Telehealth (INDEPENDENT_AMBULATORY_CARE_PROVIDER_SITE_OTHER): Payer: Self-pay | Admitting: Orthopedic Surgery

## 2018-04-13 NOTE — Telephone Encounter (Signed)
Patient called back to request that an order for a donut seat cushion also be sent to [facility].  Patients # [phone]

## 2018-04-13 NOTE — Telephone Encounter (Signed)
Patient said she uses the lifestyle or the [employer] wraps. The number to contact patient is [phone]

## 2018-04-13 NOTE — Telephone Encounter (Signed)
Patient called asked if Dr Sharol Given will write a Rx for the bands that she wrap around her knees.  Patient said she uses [facility] off [address].  The number to contact patient is [phone]

## 2018-04-16 ENCOUNTER — Other Ambulatory Visit: Payer: Self-pay

## 2018-04-16 ENCOUNTER — Ambulatory Visit (INDEPENDENT_AMBULATORY_CARE_PROVIDER_SITE_OTHER): Payer: Medicare Other | Admitting: Family Medicine

## 2018-04-16 ENCOUNTER — Ambulatory Visit: Payer: Medicare Other | Admitting: Family Medicine

## 2018-04-16 ENCOUNTER — Encounter: Payer: Self-pay | Admitting: Family Medicine

## 2018-04-16 VITALS — BP 124/68 | HR 63 | Temp 97.5°F | Ht 65.0 in | Wt 162.0 lb

## 2018-04-16 DIAGNOSIS — I480 Paroxysmal atrial fibrillation: Secondary | ICD-10-CM

## 2018-04-16 DIAGNOSIS — E119 Type 2 diabetes mellitus without complications: Secondary | ICD-10-CM

## 2018-04-16 LAB — POCT GLYCOSYLATED HEMOGLOBIN (HGB A1C): HbA1c, POC (controlled diabetic range): 8.3 % — AB (ref 0.0–7.0)

## 2018-04-16 MED ORDER — METFORMIN HCL ER 750 MG PO TB24: 750.0000 mg | ORAL_TABLET | Freq: Every day | ORAL | 5 refills | Status: DC

## 2018-04-16 MED ORDER — POTASSIUM CHLORIDE ER 10 MEQ PO TBCR: 10.0000 meq | EXTENDED_RELEASE_TABLET | Freq: Every day | ORAL | 2 refills | Status: DC

## 2018-04-16 NOTE — Patient Instructions (Addendum)
It was nice seeing you today Ms. Groleau!  Please continue cutting down on drinking Coke and smoking cigarettes.  Please also work on reducing the amount of sugar and carbohydrates in your diet.  We will continue to watch your diabetes rather than adding a new medication today.  Your metformin, potassium, and Norvasc should be waiting for you at your pharmacy.  I will try to get in touch with Dr. Doylene Canard to figure out whether he should be taking Eliquis or not.  If you have any questions or concerns, please feel free to call the clinic.   Be well,  Dr. Shan Levans

## 2018-04-16 NOTE — Telephone Encounter (Signed)
done

## 2018-04-16 NOTE — Progress Notes (Signed)
Subjective:    [name] Long - 67 y.o. female MRN [id number]  Date of birth: [birth year]  HPI  [name] Long is here for follow-up of her diabetes.  She also would like several medications refilled.  Diabetes -Patient does not regularly check her blood sugars -Patient says that she drinks a lot of regular Cokes, and she thinks that her diabetes will improve if she can switch to diet Coke instead -Patient continues to take metformin XR 750 mg daily but has stopped taking Tradjenta -Patient does not want to start a new medication for her diabetes and would rather continue with lifestyle modifications.  Medication reconciliation -Patient says that she has stopped taking Eliquis because Dr. Doylene Canard told her to stop taking it.  She cannot say when he told her this, but she thinks it was in July of this year. -She has also stopped taking Zyrtec, Cardizem, Flonase, Ativan, and Toprol-XL -Patient would like refills on her Norvasc, potassium, and metformin.  She has been out of these medications for the last week      Health Maintenance:  -Patient continues to smoke and is working on quitting Health Maintenance Due  Topic Date Due  . COLONOSCOPY  07/24/2001  . OPHTHALMOLOGY EXAM  03/01/2018    -  reports that she has been smoking cigarettes. She has a 22.50 pack-year smoking history. She has never used smokeless tobacco. - Review of Systems: Per HPI. - Past Medical History: Patient Active Problem List   Diagnosis Date Noted  . Colon cancer screening 02/23/2018  . Tooth pain 10/08/2017  . Diarrhea 10/06/2017  . New onset atrial flutter ([place]) 07/10/2017  . Atrial flutter with rapid ventricular response ([place]) 07/10/2017  . Atrial fibrillation ([place]) 07/05/2017  . COPD (chronic obstructive pulmonary disease) ([place]) 01/22/2017  . Shortness of breath 12/25/2016  . Pedal edema   . Acute on chronic congestive heart failure ([place])   . Irregular heart beat   . Chronic bilateral low  back pain without sciatica 08/15/2016  . 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 Impingement syndrome of right shoulder 08/15/2016  . Schizoaffective disorder, bipolar type ([place])   . URI (upper respiratory infection) 03/09/2016  . Muscle strain of chest wall 10/21/2015  . Activities of daily living deficit involving bathing of lower body 08/10/2015  . Hypokalemia 01/22/2015  . Vitamin D deficiency 12/19/2014  . Preventative health care 03/21/2014  . Bronchitis, chronic obstructive w acute bronchitis ([place]) 01/07/2014  . Osteoarthritis, multiple sites 06/08/2011  . COPD exacerbation ([place]) 09/23/2010  . Fall 09/23/2010  . CHRONIC KIDNEY DISEASE STAGE II ([place]) 09/14/2009  . Hypothyroidism 08/31/2006  . Type 2 diabetes mellitus ([place]) 08/31/2006  . HYPERCHOLESTEROLEMIA 08/31/2006  . Tobacco use disorder 08/31/2006  . HYPERTENSION, BENIGN SYSTEMIC 08/31/2006   - Medications: reviewed and updated   Objective:   Physical Exam BP 124/68   Pulse 63   Temp (!) 97.5 F (36.4 C) (Oral)   Ht 5\' 5"  (1.651 m)   Wt 162 lb (73.5 kg)   SpO2 98%   BMI 26.96 kg/m  Gen: NAD, alert, cooperative with exam, in good spirits today CV: RRR, good S1/S2, no murmur, no edema Resp: CTABL, no wheezes, non-labored Abd: SNTND, BS present, no guarding or organomegaly Neuro: no gross deficits.  Psych: Poor insight and judgment, patient appears to have a blunted affect today        Assessment & Plan:   Type 2 diabetes mellitus (  HCC) A1c was 8.3, up from 7.5.  Patient was encouraged to consider starting a new medicine, but she would like to try lifestyle modifications first.  We will see her back in about 3 months to check on her progress.  Metformin was refilled today.  Atrial fibrillation [facility]) Patient is a poor historian, and there is no record of Dr. Doylene Canard advising her to stop Eliquis, so I will call his office to check if this is the case and  to set her up with an appointment to see him sometime in the near future to discuss this issue.  I will restart Eliquis if Dr. Doylene Canard recommends it.    Maia Breslow, M.D. 04/17/2018, 2:44 PM PGY-2, [place]

## 2018-04-16 NOTE — Telephone Encounter (Signed)
Witting order for [facility] and will fax once complete.

## 2018-04-16 NOTE — Telephone Encounter (Signed)
This has been done.

## 2018-04-17 ENCOUNTER — Telehealth: Payer: Self-pay | Admitting: Family Medicine

## 2018-04-17 ENCOUNTER — Other Ambulatory Visit: Payer: Self-pay | Admitting: Family Medicine

## 2018-04-17 MED ORDER — APIXABAN 5 MG PO TABS: 5.0000 mg | ORAL_TABLET | Freq: Two times a day (BID) | ORAL | 2 refills | Status: DC

## 2018-04-17 NOTE — Assessment & Plan Note (Signed)
A1c was 8.3, up from 7.5.  Patient was encouraged to consider starting a new medicine, but she would like to try lifestyle modifications first.  We will see her back in about 3 months to check on her progress.  Metformin was refilled today.

## 2018-04-17 NOTE — Telephone Encounter (Signed)
I called patient's cardiologist, Dr. Doylene Canard, to confirm that he told [name] Long to stop taking Eliquis, which is what she reported to me at her appointment yesterday.  He said that he did not tell her to stop this medication and that he wants her to resume taking Eliquis 5 mg BID as soon as possible.  He also says that she needs to make an appointment with him and to bring her medications at that time.    This was relayed with [name] Long via VM.  She called the clinic soon after, and the information was reiterated with her at that time as well.

## 2018-04-17 NOTE — Assessment & Plan Note (Signed)
Patient is a poor historian, and there is no record of Dr. Doylene Canard advising her to stop Eliquis, so I will call his office to check if this is the case and to set her up with an appointment to see him sometime in the near future to discuss this issue.  I will restart Eliquis if Dr. Doylene Canard recommends it.

## 2018-04-23 ENCOUNTER — Telehealth: Payer: Self-pay | Admitting: Family Medicine

## 2018-04-23 NOTE — Telephone Encounter (Signed)
Pt would like to know if she could have a copy of her most recent x-rays. Pt has an appointment on 04/30/18 and she would like to pick them up at the time of her appointment.

## 2018-04-24 ENCOUNTER — Other Ambulatory Visit: Payer: Self-pay | Admitting: Family Medicine

## 2018-04-24 NOTE — Telephone Encounter (Signed)
Pt would like to know if Dr. Shan Levans can refill her albuterol 2.5 for her inhaler. She pt states she needs to as soon as possible.

## 2018-04-25 ENCOUNTER — Other Ambulatory Visit: Payer: Self-pay

## 2018-04-25 MED ORDER — ALBUTEROL SULFATE HFA 108 (90 BASE) MCG/ACT IN AERS: INHALATION_SPRAY | RESPIRATORY_TRACT | 0 refills | Status: DC

## 2018-04-25 MED ORDER — ALBUTEROL SULFATE (2.5 MG/3ML) 0.083% IN NEBU: 2.5000 mg | INHALATION_SOLUTION | Freq: Four times a day (QID) | RESPIRATORY_TRACT | 12 refills | Status: DC | PRN

## 2018-04-25 NOTE — Telephone Encounter (Signed)
I can provide the patient with these at her next appointment.  Thanks.

## 2018-04-26 ENCOUNTER — Telehealth: Payer: Self-pay | Admitting: Licensed Clinical Social Worker

## 2018-04-26 NOTE — Progress Notes (Signed)
  LCSW received phone call from patient reference unable to afford Proventil and inhaler .  (patient's demographics mographics.   The following was discussed how patient obtained in the past, barriers to obtaining and resources explored.  States difficult for her to breath and she needs medication.  Will not have money until Nov. 3rd.  She has all of her other medications.  L[facility] called [facility] to verify [facility]-pay.  Money provided from indigent fund to cover co-pay. Patient appreciative of assistance, daughter will bring her to pickup $8.00.   Casimer Lanius, [place]   [phone] 3:05 PM

## 2018-04-30 ENCOUNTER — Other Ambulatory Visit: Payer: Self-pay

## 2018-04-30 ENCOUNTER — Ambulatory Visit (INDEPENDENT_AMBULATORY_CARE_PROVIDER_SITE_OTHER): Payer: Medicare Other | Admitting: Family Medicine

## 2018-04-30 ENCOUNTER — Encounter: Payer: Self-pay | Admitting: Family Medicine

## 2018-04-30 DIAGNOSIS — N183 Chronic kidney disease, stage 3 unspecified: Secondary | ICD-10-CM

## 2018-04-30 DIAGNOSIS — F17200 Nicotine dependence, unspecified, uncomplicated: Secondary | ICD-10-CM | POA: Diagnosis not present

## 2018-04-30 DIAGNOSIS — F250 Schizoaffective disorder, bipolar type: Secondary | ICD-10-CM | POA: Diagnosis not present

## 2018-04-30 DIAGNOSIS — E1122 Type 2 diabetes mellitus with diabetic chronic kidney disease: Secondary | ICD-10-CM

## 2018-04-30 DIAGNOSIS — I5033 Acute on chronic diastolic (congestive) heart failure: Secondary | ICD-10-CM

## 2018-04-30 DIAGNOSIS — I10 Essential (primary) hypertension: Secondary | ICD-10-CM | POA: Diagnosis not present

## 2018-04-30 MED ORDER — SITAGLIPTIN PHOSPHATE 25 MG PO TABS: 25.0000 mg | ORAL_TABLET | Freq: Every day | ORAL | 6 refills | Status: DC

## 2018-04-30 MED ORDER — HYDROCHLOROTHIAZIDE 25 MG PO TABS: 25.0000 mg | ORAL_TABLET | Freq: Every day | ORAL | 3 refills | Status: DC

## 2018-04-30 MED ORDER — METFORMIN HCL ER 750 MG PO TB24: 750.0000 mg | ORAL_TABLET | Freq: Every day | ORAL | 3 refills | Status: DC

## 2018-04-30 NOTE — Patient Instructions (Addendum)
I sent in refills for your HCTZ and your metformin. I sent in a new diabetes medicine - one pill per day.  This diabetes medicine usually helps get extra fluid.  Only take the buspirone twice a day since it is making you sleepy. Definitely cut back on the salt.  The salt is what makes your body retain fluid.   Call the home supplier about your nebulizer machine broken.   You need to quit smoking.   You are a complicated woman.  See Dr. Shan Levans in one month.  She and you need to work together to keep you going.

## 2018-05-01 NOTE — Assessment & Plan Note (Signed)
Stable on current meds.  Refill HCTZ

## 2018-05-01 NOTE — Assessment & Plan Note (Signed)
Adding Januvia should help with diuresis.  Low salt diet.

## 2018-05-01 NOTE — Assessment & Plan Note (Signed)
Did patient centered discussion and will decrease buspar to bid.

## 2018-05-01 NOTE — Progress Notes (Signed)
   Subjective:    Patient ID: [name] Long, female    DOB: 10/02/1950, 67 y.o.   MRN: [id number]  HPI Multiple issues: 1. C/O fluid retention.  Bilateral ankle swelling.  No change in meds.  Her DM is under better control so may have lost some osmotic diuresis.  Does have grade one diastolic dysfunction CHF by last echo.  Admits to dietary indiscretion of salt.  Also has CKD 3 which may contribute. 2. DM  Improved A1C (11.2 to 8.7 to 8.1) but still less than Ideal control.States working hard on diet. 3. Still smokes.  Needs a fix of neb machine.  No acute dyspnea. 4. Needs refills on HCTZ and metformin 5. Shizoaffective.  buspar 5 TID makes more sleepy.  Mental illness makes hx difficult.    Review of Systems     Objective:   Physical Exam VS noted.  Wt stable. Lungs clear Cardiac RRR without m or g Ext 2-3+ bilateral pitting edema.       Assessment & Plan:

## 2018-05-01 NOTE — Assessment & Plan Note (Signed)
Once more advised to quit.

## 2018-05-01 NOTE — Assessment & Plan Note (Signed)
Add januvia

## 2018-05-03 ENCOUNTER — Telehealth: Payer: Self-pay | Admitting: Family Medicine

## 2018-05-03 ENCOUNTER — Other Ambulatory Visit: Payer: Self-pay | Admitting: Family Medicine

## 2018-05-03 MED ORDER — GLUCOSE BLOOD VI STRP: ORAL_STRIP | 12 refills | Status: DC

## 2018-05-03 MED ORDER — OMEPRAZOLE 20 MG PO CPDR: 20.0000 mg | DELAYED_RELEASE_CAPSULE | Freq: Every day | ORAL | 1 refills | Status: DC

## 2018-05-03 NOTE — Telephone Encounter (Signed)
Pt would like to have her omeprazole and test strips sent to her pharmacy. Pt asked that someone call her when this has been done. The best contact number is [phone].

## 2018-05-03 NOTE — Telephone Encounter (Signed)
Transmission to pharmacy failed on this Rx. memd

## 2018-05-03 NOTE — Telephone Encounter (Signed)
Medications have been sent.  Thanks!

## 2018-05-03 NOTE — Telephone Encounter (Signed)
Contacted pt to inform her that these had been sent and she said something about the codes.  Contacted pharmacy and for the test strip you need to supply them with the ICD-10 codes for this due to part B.  Please resend this to pharmacy and let me know when complete so I can contact pt. Katharina Caper, April D, [place]

## 2018-05-03 NOTE — Telephone Encounter (Signed)
Pt would like for Dr. Shan Levans to call her. She has some question about possibly getting some kind of wraps for her legs.

## 2018-05-03 NOTE — Telephone Encounter (Signed)
It looks like the diagnosis code was already on the prescription, but I resent it and made sure that the diagnosis code was still on the comments section.  Thanks.

## 2018-05-04 ENCOUNTER — Other Ambulatory Visit: Payer: Self-pay | Admitting: Family Medicine

## 2018-05-04 DIAGNOSIS — H04123 Dry eye syndrome of bilateral lacrimal glands: Secondary | ICD-10-CM | POA: Diagnosis not present

## 2018-05-04 MED ORDER — GLUCOSE BLOOD VI STRP: ORAL_STRIP | 12 refills | Status: DC

## 2018-05-04 NOTE — Telephone Encounter (Signed)
I reordered the test strips from scratch, so hopefully it will go through this time.  It says it was sent yesterday, so I don't know why the pharmacy didn't get it.

## 2018-05-08 ENCOUNTER — Other Ambulatory Visit: Payer: Self-pay | Admitting: Family Medicine

## 2018-05-08 NOTE — Telephone Encounter (Signed)
I think [name] is referring to compression stockings, which I highly recommend.  She can buy these at any pharmacy and does not need a prescription from me.

## 2018-05-10 ENCOUNTER — Other Ambulatory Visit: Payer: Self-pay | Admitting: *Deleted

## 2018-05-10 DIAGNOSIS — N183 Type 2 diabetes mellitus with diabetic chronic kidney disease: Secondary | ICD-10-CM

## 2018-05-10 DIAGNOSIS — M545 Low back pain, unspecified: Secondary | ICD-10-CM

## 2018-05-10 DIAGNOSIS — E1122 Type 2 diabetes mellitus with diabetic chronic kidney disease: Secondary | ICD-10-CM

## 2018-05-10 DIAGNOSIS — G8929 Other chronic pain: Secondary | ICD-10-CM

## 2018-05-10 MED ORDER — METFORMIN HCL ER 750 MG PO TB24: 750.0000 mg | ORAL_TABLET | Freq: Every day | ORAL | 3 refills | Status: DC

## 2018-05-10 MED ORDER — TRAMADOL HCL 50 MG PO TABS: 50.0000 mg | ORAL_TABLET | Freq: Three times a day (TID) | ORAL | 0 refills | Status: DC | PRN

## 2018-05-10 NOTE — Telephone Encounter (Signed)
Pt wanted to let the MD know that the epsom salt and elevating her feet are helping with the swelling. [place], Salome Spotted, CMA

## 2018-05-14 NOTE — Telephone Encounter (Signed)
Contacted pt and informed her of below and she said that she has tried the compression stocking for two weeks and she feels like they are not helping.  She said that is why she would like to get gauze and tape to wrap her feet and legs.  She also stated that PCP was supposed to contact a supply place on [address] for some knee bands and a donut for her bottom.  She said to let Dr. Shan Levans know that she doesn't get to sit and elevate her legs due to her "cooking and cleaning and cooking and cleaning" she stated. Katharina Caper, April D, [place]

## 2018-05-16 ENCOUNTER — Other Ambulatory Visit: Payer: Self-pay | Admitting: Family Medicine

## 2018-05-16 NOTE — Telephone Encounter (Signed)
Please let [name] Long know that she should go to a pharmacy or [facility] to look for the wrap she would like to use for her legs.  Unfortunately that is not something that is an option for me to order.  Also, we did not discuss any knee bands or doughnut for her bottom, and I do not see it listed in her last visit with Dr. Andria Frames.  I am happy to discuss this with her at her visit with me in 2 weeks.

## 2018-05-17 NOTE — Telephone Encounter (Signed)
Spoke to pt. She needs gauze and bandages for her legs that keep swelling. She said she can wrap her own legs but if the Dr. calls [facility] with a Rx they will deliver and send bill to medicare. Ottis Stain, CMA

## 2018-05-18 ENCOUNTER — Other Ambulatory Visit: Payer: Self-pay | Admitting: Family Medicine

## 2018-05-21 ENCOUNTER — Telehealth: Payer: Self-pay | Admitting: Family Medicine

## 2018-05-21 NOTE — Telephone Encounter (Signed)
Daughter is calling because her mother's breathing is a lot heavier, and she is shaking when trying to hold something in her hand. She also said that her memory seems to be getting worse. She would like the doctor call her ASAP. We have scheduled an Access to care appointment for tomorrow. jw

## 2018-05-22 ENCOUNTER — Ambulatory Visit: Payer: Medicare Other

## 2018-05-22 ENCOUNTER — Other Ambulatory Visit: Payer: Self-pay | Admitting: Family Medicine

## 2018-05-22 NOTE — Telephone Encounter (Signed)
Pt would like W[facility] to call her back concerning some personal issues going on. Pt stated she would like for her daughters [name] and Chrystine Oiler to not have any access to her medical stuff because she thinks they are trying to just put her in the hospital so they can get the money that was left to the pt when her uncle passed away. Pt said she would like for her children to let her medication and nebulizer work. Please have Dr. Shan Levans give pt a call back.

## 2018-05-22 NOTE — Telephone Encounter (Signed)
Fortunately I cannot find a DME order on [facility] for gauze and bandages.  I can free write it on a paper prescription if you will think that will work.  Otherwise, she can find the supplies at a pharmacy or [place].

## 2018-05-22 NOTE — Telephone Encounter (Signed)
I'm sorry for those typos previously - I meant unfortunately, I cannot find a DME order to match her request, but I can write a paper prescription for gauze and bandages if she would like and if you all think that that would be helpful.  She can also ask the provider who sees her today for this since she has an appointment in acute care clinic.

## 2018-05-23 DIAGNOSIS — I10 Essential (primary) hypertension: Secondary | ICD-10-CM | POA: Diagnosis not present

## 2018-05-23 DIAGNOSIS — R072 Precordial pain: Secondary | ICD-10-CM | POA: Diagnosis not present

## 2018-05-23 DIAGNOSIS — R0602 Shortness of breath: Secondary | ICD-10-CM | POA: Diagnosis not present

## 2018-05-23 DIAGNOSIS — J4520 Mild intermittent asthma, uncomplicated: Secondary | ICD-10-CM | POA: Diagnosis not present

## 2018-05-25 NOTE — Telephone Encounter (Signed)
Patient back regarding her wishes for her daughters to not have access to her medical information.  She at first did not realize that she had called about this, but when I reminded her, she reiterated that she did not want her daughters to have access to her medical information and that she would instead allow her son Natalya Domzalski to have access to this information.  We agreed that we would discuss this further at her appointment on 11/26.

## 2018-05-29 ENCOUNTER — Ambulatory Visit: Payer: Medicare Other | Admitting: Family Medicine

## 2018-05-30 ENCOUNTER — Ambulatory Visit (INDEPENDENT_AMBULATORY_CARE_PROVIDER_SITE_OTHER): Payer: Medicare Other | Admitting: Family

## 2018-06-07 ENCOUNTER — Ambulatory Visit (INDEPENDENT_AMBULATORY_CARE_PROVIDER_SITE_OTHER): Payer: Medicare Other | Admitting: Orthopedic Surgery

## 2018-06-11 ENCOUNTER — Ambulatory Visit (INDEPENDENT_AMBULATORY_CARE_PROVIDER_SITE_OTHER): Payer: Medicare Other | Admitting: Orthopedic Surgery

## 2018-06-11 ENCOUNTER — Encounter (INDEPENDENT_AMBULATORY_CARE_PROVIDER_SITE_OTHER): Payer: Self-pay | Admitting: Orthopedic Surgery

## 2018-06-11 VITALS — Ht 65.0 in | Wt 164.0 lb

## 2018-06-11 DIAGNOSIS — M7541 Impingement syndrome of right shoulder: Secondary | ICD-10-CM | POA: Diagnosis not present

## 2018-06-11 DIAGNOSIS — M7542 Impingement syndrome of left shoulder: Secondary | ICD-10-CM

## 2018-06-12 ENCOUNTER — Encounter (INDEPENDENT_AMBULATORY_CARE_PROVIDER_SITE_OTHER): Payer: Self-pay | Admitting: Orthopedic Surgery

## 2018-06-12 DIAGNOSIS — M7542 Impingement syndrome of left shoulder: Secondary | ICD-10-CM | POA: Diagnosis not present

## 2018-06-12 DIAGNOSIS — M7541 Impingement syndrome of right shoulder: Secondary | ICD-10-CM

## 2018-06-12 NOTE — Progress Notes (Signed)
Office Visit Note   Patient: [name] Long           Date of Birth: 01/01/1951           MRN: [id number] Visit Date: 06/11/2018              Requested by: Kathrene Alu, MD [address], [place] [address] PCP: Kathrene Alu, MD  Chief Complaint  Patient presents with  . Left Shoulder - Pain  . Right Shoulder - Pain      HPI: Patient is a 67 year old woman who presents with recurrent impingement symptoms of both shoulders right worse than left she complains of decreasing range of motion she states that shots have helped in the past.  Patient states that her father is currently in assisted living and she has a lot of anxiety regarding his care.  Assessment & Plan: Visit Diagnoses:  1. Impingement syndrome of right shoulder   2. Impingement syndrome of left shoulder     Plan: Both shoulders were injected she tolerated this well.  Follow-Up Instructions: Return if symptoms worsen or fail to improve.   Ortho Exam  Patient is alert, oriented, no adenopathy, well-dressed, normal affect, normal respiratory effort. Examination patient has abduction flexion of both shoulders to about 70 degrees.  She has pain with Neer Hawkins impingement test pain to palpation of the biceps tendon.  She has no radicular symptoms no focal motor weakness.  Imaging: No results found. No images are attached to the encounter.  Labs: Lab Results  Component Value Date   HGBA1C 8.3 (A) 04/16/2018   HGBA1C 7.7 (A) 12/25/2017   HGBA1C 8.1 (H) 07/10/2017   LABORGA NO GROWTH 12/04/2014     Lab Results  Component Value Date   ALBUMIN 3.3 (L) 12/06/2017   ALBUMIN 2.8 (L) 07/10/2017   ALBUMIN 3.0 (L) 12/26/2016    Body mass index is 27.29 kg/m.  Orders:  No orders of the defined types were placed in this encounter.  No orders of the defined types were placed in this encounter.    Procedures: Large Joint Inj: bilateral subacromial bursa on 06/12/2018 12:19  PM Indications: diagnostic evaluation and pain Details: 22 G 1.5 in needle, posterior approach  Arthrogram: No  Outcome: tolerated well, no immediate complications Procedure, treatment alternatives, risks and benefits explained, specific risks discussed. Consent was given by the patient. Immediately prior to procedure a time out was called to verify the correct patient, procedure, equipment, support staff and site/side marked as required. Patient was prepped and draped in the usual sterile fashion.      Clinical Data: No additional findings.  ROS:  All other systems negative, except as noted in the HPI. Review of Systems  Objective: Vital Signs: Ht '5\' 5"'  (1.651 m)   Wt 164 lb (74.4 kg)   BMI 27.29 kg/m   Specialty Comments:  No specialty comments available.  PMFS History: Patient Active Problem List   Diagnosis Date Noted  . Colon cancer screening 02/23/2018  . Tooth pain 10/08/2017  . Diarrhea 10/06/2017  . New onset atrial flutter ([place]) 07/10/2017  . Atrial flutter with rapid ventricular response ([place]) 07/10/2017  . Atrial fibrillation ([place]) 07/05/2017  . COPD (chronic obstructive pulmonary disease) ([place]) 01/22/2017  . Shortness of breath 12/25/2016  . Pedal edema   . Acute on chronic congestive heart failure ([place])   . Irregular heart beat   . Chronic bilateral low back pain without sciatica 08/15/2016  . 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 Impingement syndrome of right shoulder 08/15/2016  . Schizoaffective disorder, bipolar type ([place])   . URI (upper respiratory infection) 03/09/2016  . Muscle strain of chest wall 10/21/2015  . Activities of daily living deficit involving bathing of lower body 08/10/2015  . Hypokalemia 01/22/2015  . Vitamin D deficiency 12/19/2014  . Preventative health care 03/21/2014  . Bronchitis, chronic obstructive w acute bronchitis ([place]) 01/07/2014  . Osteoarthritis, multiple  sites 06/08/2011  . COPD exacerbation ([place]) 09/23/2010  . Fall 09/23/2010  . CHRONIC KIDNEY DISEASE STAGE II (MILD) 09/14/2009  . Hypothyroidism 08/31/2006  . Type 2 diabetes mellitus ([place]) 08/31/2006  . HYPERCHOLESTEROLEMIA 08/31/2006  . Tobacco use disorder 08/31/2006  . HYPERTENSION, BENIGN SYSTEMIC 08/31/2006   Past Medical History:  Diagnosis Date  . Arthritis    "real bad; all over" (07/12/2017)  . Asthma   . BIPOLAR DISORDER 08/31/2006   Qualifier: Diagnosis of  By: Dorathy Daft MD, Marjory Lies    . CHRONIC KIDNEY DISEASE STAGE II (MILD) 09/14/2009   Annotation: eGFR 64 Qualifier: Diagnosis of  By: Jess Barters MD, [name]    . Chronic lower back pain   . COPD (chronic obstructive pulmonary disease) ([place]) 09/23/2010   Diagnosed at [facility] in 2008 (Dr. Annamaria Boots)   . DM (diabetes mellitus) type II controlled with renal manifestation ([place]) 08/31/2006   Qualifier: Diagnosis of  By: Dorathy Daft MD, Marjory Lies    . Dyspnea    "all my life; since 6th grade" (07/12/2017)  . HYPERCHOLESTEROLEMIA 08/31/2006   Intolerance to Lipitor OK on Crestor but medicaid no longer covering    . HYPERTENSION, BENIGN SYSTEMIC 08/31/2006   Qualifier: Diagnosis of  By: Dorathy Daft MD, Marjory Lies    . HYPOTHYROIDISM, UNSPECIFIED 08/31/2006   Qualifier: Diagnosis of  By: Dorathy Daft MD, Marjory Lies    . Leg swelling 03/14/2018  . Pneumonia    "3 times" (07/12/2017)  . Scoliosis     Family History  Problem Relation Age of Onset  . Breast cancer Sister     Past Surgical History:  Procedure Laterality Date  . CESAREAN SECTION    . FOOT FRACTURE SURGERY Right    "steel plate in it"  . FOOT SURGERY     " born w/dislocated foot"  . FRACTURE SURGERY    . KNEE ARTHROSCOPY Right   . TOE SURGERY Bilateral    "both pinky toes"  . TONSILLECTOMY AND ADENOIDECTOMY     Social History   Occupational History  . Not on file  Tobacco Use  . Smoking status: Current Every Day Smoker    Packs/day: 0.50    Years: 45.00    Pack years: 22.50    Types:  Cigarettes  . Smokeless tobacco: Never Used  Substance and Sexual Activity  . Alcohol use: Yes    Comment: 07/12/2017 "stopped 2 yr ago; did have a drink over the holidays recently"  . Drug use: No  . Sexual activity: Yes

## 2018-06-14 ENCOUNTER — Other Ambulatory Visit: Payer: Self-pay | Admitting: Family Medicine

## 2018-06-14 NOTE — Telephone Encounter (Signed)
Pt is calling for a refill on her sugar pills called in. jw

## 2018-06-15 ENCOUNTER — Ambulatory Visit: Payer: Medicare Other | Admitting: Family Medicine

## 2018-06-15 ENCOUNTER — Other Ambulatory Visit: Payer: Self-pay | Admitting: Family Medicine

## 2018-06-15 MED ORDER — GLUCOSE BLOOD VI STRP: ORAL_STRIP | 12 refills | Status: DC

## 2018-06-15 NOTE — Telephone Encounter (Signed)
I am not sure why there is a problem with getting her strips because she has a prescription in for strips since November with a year supply of refills, but I resent the prescription hoping that it will solve this problem.

## 2018-06-15 NOTE — Telephone Encounter (Signed)
Pt calling about her blood sugar strips - went to pharmacy and they are telling her it has to be authorized. Pt says she hasnt had this problem. Pt said she will be out before Monday which is why she called yesterday. Please call pt back to discuss this.

## 2018-06-18 NOTE — Telephone Encounter (Signed)
Contacted pt to let her know that I called her pharmacy and with her Medicare Part B she stated that her strips could be refilled on 06/24/2018.  Pt understood. Katharina Caper, April D, [place]

## 2018-06-20 ENCOUNTER — Ambulatory Visit (INDEPENDENT_AMBULATORY_CARE_PROVIDER_SITE_OTHER): Payer: Medicare Other | Admitting: Orthopedic Surgery

## 2018-06-25 NOTE — Telephone Encounter (Signed)
Pt is calling and said that she was still not able to pick up her strips. Pharmacy said they still need the authorization.   Pt would like to know if someone can call the pharmacy again. Pt said she only has 6 strips left.

## 2018-06-29 ENCOUNTER — Ambulatory Visit: Payer: Medicare Other | Admitting: Family Medicine

## 2018-06-29 ENCOUNTER — Other Ambulatory Visit: Payer: Self-pay

## 2018-06-29 NOTE — Addendum Note (Signed)
Addended by: Dorna Bloom on: 06/29/2018 11:29 AM   Modules accepted: Orders

## 2018-06-29 NOTE — Telephone Encounter (Signed)
Pt called again stating she is still not able to pick up her test strips. Pt states an authorized user needs to send in. I will route to an attending.

## 2018-07-05 MED ORDER — GLUCOSE BLOOD VI STRP: ORAL_STRIP | 12 refills | Status: DC

## 2018-07-05 NOTE — Telephone Encounter (Signed)
Pt calling about her test strips. Pt still has not gotten them filled yet. [name] Long cannot fill them since she is not PICOS enrolled. Please refill and let pt know once they have been filled. Pt says she's been out for a couple weeks and hasn't been able to check sugar.

## 2018-07-05 NOTE — Telephone Encounter (Signed)
Signed with preceptor name via standing order and sent to pharmacy. Danley Danker, RN [facility] RN)

## 2018-07-09 NOTE — Addendum Note (Signed)
Addended by: Talbert Cage L on: 07/09/2018 08:51 AM   Modules accepted: Orders

## 2018-07-13 ENCOUNTER — Ambulatory Visit (INDEPENDENT_AMBULATORY_CARE_PROVIDER_SITE_OTHER): Payer: Medicare Other | Admitting: Family Medicine

## 2018-07-13 ENCOUNTER — Encounter: Payer: Self-pay | Admitting: Family Medicine

## 2018-07-13 ENCOUNTER — Other Ambulatory Visit: Payer: Self-pay

## 2018-07-13 VITALS — BP 120/80 | HR 95 | Temp 98.0°F | Wt 164.0 lb

## 2018-07-13 DIAGNOSIS — R300 Dysuria: Secondary | ICD-10-CM | POA: Diagnosis not present

## 2018-07-13 DIAGNOSIS — R251 Tremor, unspecified: Secondary | ICD-10-CM | POA: Diagnosis not present

## 2018-07-13 DIAGNOSIS — R5383 Other fatigue: Secondary | ICD-10-CM

## 2018-07-13 DIAGNOSIS — N183 Chronic kidney disease, stage 3 unspecified: Secondary | ICD-10-CM

## 2018-07-13 DIAGNOSIS — E1122 Type 2 diabetes mellitus with diabetic chronic kidney disease: Secondary | ICD-10-CM

## 2018-07-13 LAB — POCT GLYCOSYLATED HEMOGLOBIN (HGB A1C): HbA1c, POC (controlled diabetic range): 7.1 % — AB (ref 0.0–7.0)

## 2018-07-13 MED ORDER — FLUTICASONE-UMECLIDIN-VILANT 100-62.5-25 MCG/INH IN AEPB: 100.0000 ug | INHALATION_SPRAY | Freq: Every day | RESPIRATORY_TRACT | 0 refills | Status: DC

## 2018-07-13 NOTE — Progress Notes (Signed)
Subjective:    [name] Long - 68 y.o. female MRN [id number]  Date of birth: 07/29/1950  CC:  ZEN FELLING is here for lightheadedness and sleepiness.  She also has had a tremor.  HPI: Lightheadedness and sleepiness - started a few days after starting buspar about one month ago -Almost fell recently -Receives psychotropic medication at [facility]  Tremors - has noted tremors a few months ago, then improved, and has now returned - daughter wonders if her potassium or vitamin D could be causing this symptom  Diabetes - continues taking Januvia  25 mg and metformin 750 mg once daily - blood sugars range from 80-120 in the mornings - No hypoglycemic symptoms -Says that she continues to try to drink Diet Coke rather than regular Coke; this seems to be a very important aspect of her diabetes management in her opinion  Urinary symptoms - endorses dysuria, urinary frequency and urinary urgency for the last few weeks, but does not bring this up until I asked her about it  Health Maintenance:  Health Maintenance Due  Topic Date Due  . COLONOSCOPY  07/24/2001  . OPHTHALMOLOGY EXAM  03/01/2018  . FOOT EXAM  04/26/2018  . URINE MICROALBUMIN  04/26/2018  . PNA vac Low Risk Adult (2 of 2 - PPSV23) 04/26/2018    -  reports that she has been smoking cigarettes. She has a 22.50 pack-year smoking history. She has never used smokeless tobacco. - Review of Systems: Per HPI. - Past Medical History: Patient Active Problem List   Diagnosis Date Noted  . Other fatigue 07/15/2018  . Tremor 07/15/2018  . Colon cancer screening 02/23/2018  . Tooth pain 10/08/2017  . Diarrhea 10/06/2017  . New onset atrial flutter ([place]) 07/10/2017  . Atrial flutter with rapid ventricular response ([place]) 07/10/2017  . Atrial fibrillation ([place]) 07/05/2017  . COPD (chronic obstructive pulmonary disease) ([place]) 01/22/2017  . Shortness of breath 12/25/2016  . Pedal edema   . Acute on chronic congestive  heart failure ([place])   . Irregular heart beat   . Chronic bilateral low back pain without sciatica 08/15/2016  . 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 Impingement syndrome of right shoulder 08/15/2016  . Schizoaffective disorder, bipolar type ([place])   . URI (upper respiratory infection) 03/09/2016  . Muscle strain of chest wall 10/21/2015  . Activities of daily living deficit involving bathing of lower body 08/10/2015  . Hypokalemia 01/22/2015  . Vitamin D deficiency 12/19/2014  . Preventative health care 03/21/2014  . Bronchitis, chronic obstructive w acute bronchitis ([place]) 01/07/2014  . Osteoarthritis, multiple sites 06/08/2011  . Dysuria 02/14/2011  . COPD exacerbation ([place]) 09/23/2010  . Fall 09/23/2010  . CHRONIC KIDNEY DISEASE STAGE II (MILD) 09/14/2009  . Hypothyroidism 08/31/2006  . Type 2 diabetes mellitus ([place]) 08/31/2006  . HYPERCHOLESTEROLEMIA 08/31/2006  . Tobacco use disorder 08/31/2006  . HYPERTENSION, BENIGN SYSTEMIC 08/31/2006   - Medications: reviewed and updated   Objective:   Physical Exam BP 120/80   Pulse 95   Temp 98 F (36.7 C) (Oral)   Wt 164 lb (74.4 kg)   SpO2 98%   BMI 27.29 kg/m  Gen: NAD, alert, cooperative with exam, well-appearing CV: RRR, good S1/S2, no murmur Resp: CTABL, no wheezes, non-labored Neuro: Resting tremor, prominent in left hand, worsens on finger-to-nose, normal, non-shuffling gait assisted by cane, no cogwheel rigidity of upper extremities Psych: Poor insight and judgment, frequently tangential during exam, flat  affect        Assessment & Plan:   Other fatigue Fatigue could be due to a wide variety of possible causes, but given its association with a change in her psychotropic medication regimen, advised patient to discuss this with her provider at [facility] before I attempt to make any changes.  If the symptoms continue, will obtain CBC and BMP to investigate other  causes.  Tremor Patient's resting tremor, flat affect, and decreased mental acuity could be consistent with a parkinsonian picture, but the symptoms could also be due to medication effects.  Finger-to-nose testing did worsen her tremor, possibly indicating that she has a bit of an intention tremor as well.  Reassured patient's daughter that her potassium and vitamin D levels are likely not contributing to this.  We will continue to monitor this over time.  Type 2 diabetes mellitus ([place]) Diabetes is well controlled today with an A1c of 7.1.  Congratulated patient on her improvement.  We will continue her current regimen.  Dysuria Attempted to collect urine from patient multiple times, but she was unable to urinate.  Advised patient to come back if her symptoms persist to give a urine sample at a later time.  Will not prescribe antibiotics since patient is an unreliable historian, so I do not necessarily think her report of urinary symptoms signify a current UTI.    Maia Breslow, M.D. 07/15/2018, 12:52 PM PGY-2, [place]

## 2018-07-13 NOTE — Patient Instructions (Addendum)
It was nice seeing you today [name]!  For your COPD, I am prescribing a new inhaler called Trelegy that you should take once per day.  Please asked the pharmacist to show you how to use this.  If you have any trouble with the cost of this medicine, please contact our clinic, and we can try another medicine.  Continue doing a great job taking your diabetes medications and checking your blood sugar.  Your A1c is 7.1 today which is very good.  If you cannot urinate today, please make another appointment to discuss your urinary symptoms and to try again to get a sample.  We will also continue to talk about your tremor at that time.  Please call [facility] clinic to discuss your lightheadedness, since they may want to change some of your medications.  If you have any questions or concerns, please feel free to call the clinic.   Be well,  Dr. Shan Levans

## 2018-07-15 DIAGNOSIS — R5383 Other fatigue: Secondary | ICD-10-CM | POA: Insufficient documentation

## 2018-07-15 DIAGNOSIS — R251 Tremor, unspecified: Secondary | ICD-10-CM | POA: Insufficient documentation

## 2018-07-15 NOTE — Assessment & Plan Note (Signed)
Attempted to collect urine from patient multiple times, but she was unable to urinate.  Advised patient to come back if her symptoms persist to give a urine sample at a later time.  Will not prescribe antibiotics since patient is an unreliable historian, so I do not necessarily think her report of urinary symptoms signify a current UTI.

## 2018-07-15 NOTE — Assessment & Plan Note (Signed)
Diabetes is well controlled today with an A1c of 7.1.  Congratulated patient on her improvement.  We will continue her current regimen.

## 2018-07-15 NOTE — Assessment & Plan Note (Signed)
Patient's resting tremor, flat affect, and decreased mental acuity could be consistent with a parkinsonian picture, but the symptoms could also be due to medication effects.  Finger-to-nose testing did worsen her tremor, possibly indicating that she has a bit of an intention tremor as well.  Reassured patient's daughter that her potassium and vitamin D levels are likely not contributing to this.  We will continue to monitor this over time.

## 2018-07-15 NOTE — Assessment & Plan Note (Signed)
Fatigue could be due to a wide variety of possible causes, but given its association with a change in her psychotropic medication regimen, advised patient to discuss this with her provider at [facility] before I attempt to make any changes.  If the symptoms continue, will obtain CBC and BMP to investigate other causes.

## 2018-07-16 ENCOUNTER — Ambulatory Visit (INDEPENDENT_AMBULATORY_CARE_PROVIDER_SITE_OTHER): Payer: Medicare Other | Admitting: Orthopedic Surgery

## 2018-07-16 NOTE — Telephone Encounter (Signed)
Patient called back today and stated that she was not able to pick up these RX from [facility] and wanted to know if Dr. Sharol Given will send them back in for her.  CB#[phone].  Thank you.

## 2018-07-17 NOTE — Telephone Encounter (Signed)
Called pt and she states that she did not pick up the rx in October for the knee wraps. I refaxed the order per pt request.

## 2018-07-19 ENCOUNTER — Telehealth (INDEPENDENT_AMBULATORY_CARE_PROVIDER_SITE_OTHER): Payer: Self-pay | Admitting: Orthopedic Surgery

## 2018-07-19 NOTE — Telephone Encounter (Signed)
Pt called to ask if a shot would help with the weakness in her knees and the pain that she is having.

## 2018-07-20 ENCOUNTER — Telehealth (INDEPENDENT_AMBULATORY_CARE_PROVIDER_SITE_OTHER): Payer: Self-pay

## 2018-07-20 NOTE — Telephone Encounter (Signed)
Can you call pt and let her know she should come in for appt with Dr. Sharol Given. Injections will not help weakness but if this is something that she is experiencing then should com in for evaluation

## 2018-07-20 NOTE — Telephone Encounter (Signed)
Refer to notes. 

## 2018-07-20 NOTE — Telephone Encounter (Signed)
I tried to get patient scheduled for Dr Sharol Given earliest appt for 1/27, 1/28 or 1/30 and explained he was out of office next week but patient and daughter could not decide at this time. She stated she would call back and schedule after speaking with her other daughter.

## 2018-08-03 ENCOUNTER — Ambulatory Visit (INDEPENDENT_AMBULATORY_CARE_PROVIDER_SITE_OTHER): Payer: Medicare Other | Admitting: Family Medicine

## 2018-08-03 VITALS — BP 120/70 | HR 129 | Temp 97.8°F | Wt 164.4 lb

## 2018-08-03 DIAGNOSIS — Z1211 Encounter for screening for malignant neoplasm of colon: Secondary | ICD-10-CM

## 2018-08-03 DIAGNOSIS — R42 Dizziness and giddiness: Secondary | ICD-10-CM | POA: Diagnosis not present

## 2018-08-03 NOTE — Progress Notes (Signed)
Subjective:    [name] Long - 68 y.o. female MRN [id number]  Date of birth: 1951/03/07  CC:  [name] Long is here for dizziness.  HPI: Dizziness - says that her dizziness is worse when she stands up and feels like she has fullness in her ears - feels like her head spins and feels lightheaded when she changes positions - fell recently but sustained no injuries - also endorses numbness and tingling in her feet and hands - has been using expired ear drops  - has recently stopped the buspar because she was worried that it was causing her tremors - sees Cheri Guppy at [facility], who has tried to weaned her off of the depakote, but she says she still takes 500 mg because she is afraid to start  Health Maintenance:  Health Maintenance Due  Topic Date Due  . COLONOSCOPY  07/24/2001  . OPHTHALMOLOGY EXAM  03/01/2018  . FOOT EXAM  04/26/2018  . URINE MICROALBUMIN  04/26/2018  . PNA vac Low Risk Adult (2 of 2 - PPSV23) 04/26/2018    -  reports that she has been smoking cigarettes. She has a 22.50 pack-year smoking history. She has never used smokeless tobacco. - Review of Systems: Per HPI. - Past Medical History: Patient Active Problem List   Diagnosis Date Noted  . Dizziness of unknown cause 08/04/2018  . Other fatigue 07/15/2018  . Tremor 07/15/2018  . Colon cancer screening 02/23/2018  . Tooth pain 10/08/2017  . Diarrhea 10/06/2017  . New onset atrial flutter ([place]) 07/10/2017  . Atrial flutter with rapid ventricular response ([place]) 07/10/2017  . Atrial fibrillation ([place]) 07/05/2017  . COPD (chronic obstructive pulmonary disease) ([place]) 01/22/2017  . Shortness of breath 12/25/2016  . Pedal edema   . Acute on chronic congestive heart failure ([place])   . Irregular heart beat   . Chronic bilateral low back pain without sciatica 08/15/2016  . 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 Impingement syndrome of right shoulder  08/15/2016  . Schizoaffective disorder, bipolar type ([place])   . URI (upper respiratory infection) 03/09/2016  . Muscle strain of chest wall 10/21/2015  . Activities of daily living deficit involving bathing of lower body 08/10/2015  . Hypokalemia 01/22/2015  . Vitamin D deficiency 12/19/2014  . Preventative health care 03/21/2014  . Bronchitis, chronic obstructive w acute bronchitis ([place]) 01/07/2014  . Osteoarthritis, multiple sites 06/08/2011  . Dysuria 02/14/2011  . COPD exacerbation ([place]) 09/23/2010  . Fall 09/23/2010  . CHRONIC KIDNEY DISEASE STAGE II (MILD) 09/14/2009  . Hypothyroidism 08/31/2006  . Type 2 diabetes mellitus ([place]) 08/31/2006  . HYPERCHOLESTEROLEMIA 08/31/2006  . Tobacco use disorder 08/31/2006  . HYPERTENSION, BENIGN SYSTEMIC 08/31/2006   - Medications: reviewed and updated   Objective:   Physical Exam BP 120/70   Pulse (!) 129   Temp 97.8 F (36.6 C) (Oral)   Wt 164 lb 6.4 oz (74.6 kg)   SpO2 95%   BMI 27.36 kg/m  Gen: NAD, alert, cooperative with exam CV: RRR, good S1/S2, no murmur Resp: CTABL, no wheezes, non-labored  Neuro: resting tremor in R hand, slow gait Psych: poor insight, flat affect  Orthostatic vitals: BP-Lying: 126/80 Pulse-Lying: 99  BP-Sitting: 123/80 Pulse-Sitting: 92  BP-Standing: 126/80 Pulse-Standing: 112    Assessment & Plan:   Dizziness of unknown cause Differential is broad, but most likely diagnoses include orthostatic hypotension, BPPV, other neurologic dysfunction.  I am worried that patient displays characteristics  of Parkinson disease, which may be a product of her Risperdal and Depakote.  Contact her psychiatry provider at [facility] to discuss whether these meds should be changed or continued, since they may be vital for her mental function.  Orthostatic vital signs are positive with an increase in pulse by 20 bpm from sitting to standing, although blood pressure did not change.  Advised patient to stay well-hydrated  and to transition from sitting to standing slowly.  Also encouraged her to see the geriatrics clinic so that she can get further work-up and possibly get coordination of care with physical therapy and social work due to her frequent confusion.  Colon cancer screening Patient concerned about the possibility of colon cancer but does not feel that she could tolerate a colonoscopy.  Set her up for Cologuard today.    Maia Breslow, M.D. 08/04/2018, 10:06 PM PGY-2, [place]

## 2018-08-03 NOTE — Patient Instructions (Addendum)
It was nice seeing you today [name] Long!  For your dizziness, I am going to refer you to physical therapy, and I would also like for you to come and see [place] in our geriatrics clinic, where a very experienced provider can help get you even more assistance.  I am also going to message your provider at [facility] to see if any of your medications could be causing this sensation.  I would like to see you back in about 2 or 3 months for follow-up or sooner if you need.  If you have any questions or concerns, please feel free to call the clinic.   Be well,  Dr. Shan Levans

## 2018-08-04 DIAGNOSIS — R42 Dizziness and giddiness: Secondary | ICD-10-CM | POA: Insufficient documentation

## 2018-08-04 NOTE — Assessment & Plan Note (Signed)
Patient concerned about the possibility of colon cancer but does not feel that she could tolerate a colonoscopy.  Set her up for Cologuard today.

## 2018-08-04 NOTE — Assessment & Plan Note (Addendum)
Differential is broad, but most likely diagnoses include orthostatic hypotension, BPPV, other neurologic dysfunction.  I am worried that patient displays characteristics of Parkinson disease, which may be a product of her Risperdal and Depakote.  Contact her psychiatry provider at [facility] to discuss whether these meds should be changed or continued, since they may be vital for her mental function.  Orthostatic vital signs are positive with an increase in pulse by 20 bpm from sitting to standing, although blood pressure did not change.  Advised patient to stay well-hydrated and to transition from sitting to standing slowly.  Also encouraged her to see the geriatrics clinic so that she can get further work-up and possibly get coordination of care with physical therapy and social work due to her frequent confusion.

## 2018-08-07 DIAGNOSIS — H524 Presbyopia: Secondary | ICD-10-CM | POA: Diagnosis not present

## 2018-08-07 DIAGNOSIS — H5213 Myopia, bilateral: Secondary | ICD-10-CM | POA: Diagnosis not present

## 2018-08-07 DIAGNOSIS — E119 Type 2 diabetes mellitus without complications: Secondary | ICD-10-CM | POA: Diagnosis not present

## 2018-08-07 DIAGNOSIS — H2513 Age-related nuclear cataract, bilateral: Secondary | ICD-10-CM | POA: Diagnosis not present

## 2018-08-07 LAB — HM DIABETES EYE EXAM

## 2018-08-08 ENCOUNTER — Telehealth: Payer: Self-pay | Admitting: Family Medicine

## 2018-08-08 NOTE — Telephone Encounter (Signed)
I spoke with the receptionist and left a message with the nurse at [facility] that I am concerned that [name] Long is presenting with certain signs of Parkinsonism that could be side effects of her Risperdal and Depakote prescriptions.  I told him that I understand if she needs to continue these medications for her mental health, but I wanted to at least share my concerns that the side effects of these medications are causing her additional problems, including bradykinesia, dizziness, and flat affect.  The receptionist said that she would pass these concerns onto the nurse there.

## 2018-08-23 ENCOUNTER — Ambulatory Visit: Payer: Medicare Other | Admitting: Pharmacist

## 2018-08-25 ENCOUNTER — Other Ambulatory Visit: Payer: Self-pay | Admitting: Family Medicine

## 2018-08-27 MED ORDER — POTASSIUM CHLORIDE ER 10 MEQ PO TBCR: 10.0000 meq | EXTENDED_RELEASE_TABLET | Freq: Every day | ORAL | 2 refills | Status: DC

## 2018-08-28 DIAGNOSIS — F209 Schizophrenia, unspecified: Secondary | ICD-10-CM | POA: Diagnosis not present

## 2018-09-06 DIAGNOSIS — R0602 Shortness of breath: Secondary | ICD-10-CM | POA: Diagnosis not present

## 2018-09-06 DIAGNOSIS — J4520 Mild intermittent asthma, uncomplicated: Secondary | ICD-10-CM | POA: Diagnosis not present

## 2018-09-06 DIAGNOSIS — R072 Precordial pain: Secondary | ICD-10-CM | POA: Diagnosis not present

## 2018-09-06 DIAGNOSIS — I10 Essential (primary) hypertension: Secondary | ICD-10-CM | POA: Diagnosis not present

## 2018-09-08 ENCOUNTER — Encounter (HOSPITAL_COMMUNITY): Payer: Self-pay | Admitting: Emergency Medicine

## 2018-09-08 ENCOUNTER — Emergency Department (HOSPITAL_COMMUNITY): Payer: Medicare Other

## 2018-09-08 ENCOUNTER — Other Ambulatory Visit: Payer: Self-pay

## 2018-09-08 ENCOUNTER — Emergency Department (HOSPITAL_COMMUNITY)
Admission: EM | Admit: 2018-09-08 | Discharge: 2018-09-08 | Disposition: A | Discharge status: Home / Self Care | Payer: Medicare Other | Attending: Emergency Medicine | Admitting: Emergency Medicine

## 2018-09-08 DIAGNOSIS — I509 Heart failure, unspecified: Secondary | ICD-10-CM | POA: Diagnosis not present

## 2018-09-08 DIAGNOSIS — Z7984 Long term (current) use of oral hypoglycemic drugs: Secondary | ICD-10-CM | POA: Diagnosis not present

## 2018-09-08 DIAGNOSIS — I130 Hypertensive heart and chronic kidney disease with heart failure and stage 1 through stage 4 chronic kidney disease, or unspecified chronic kidney disease: Secondary | ICD-10-CM | POA: Insufficient documentation

## 2018-09-08 DIAGNOSIS — F17210 Nicotine dependence, cigarettes, uncomplicated: Secondary | ICD-10-CM | POA: Insufficient documentation

## 2018-09-08 DIAGNOSIS — N179 Acute kidney failure, unspecified: Secondary | ICD-10-CM | POA: Diagnosis not present

## 2018-09-08 DIAGNOSIS — E039 Hypothyroidism, unspecified: Secondary | ICD-10-CM | POA: Diagnosis not present

## 2018-09-08 DIAGNOSIS — E1122 Type 2 diabetes mellitus with diabetic chronic kidney disease: Secondary | ICD-10-CM | POA: Insufficient documentation

## 2018-09-08 DIAGNOSIS — Z7901 Long term (current) use of anticoagulants: Secondary | ICD-10-CM | POA: Diagnosis not present

## 2018-09-08 DIAGNOSIS — E876 Hypokalemia: Secondary | ICD-10-CM | POA: Diagnosis not present

## 2018-09-08 DIAGNOSIS — J449 Chronic obstructive pulmonary disease, unspecified: Secondary | ICD-10-CM | POA: Insufficient documentation

## 2018-09-08 DIAGNOSIS — R0602 Shortness of breath: Secondary | ICD-10-CM | POA: Diagnosis not present

## 2018-09-08 DIAGNOSIS — R000 Tachycardia, unspecified: Secondary | ICD-10-CM | POA: Diagnosis present

## 2018-09-08 DIAGNOSIS — I480 Paroxysmal atrial fibrillation: Secondary | ICD-10-CM | POA: Insufficient documentation

## 2018-09-08 DIAGNOSIS — Z79899 Other long term (current) drug therapy: Secondary | ICD-10-CM | POA: Insufficient documentation

## 2018-09-08 DIAGNOSIS — F319 Bipolar disorder, unspecified: Secondary | ICD-10-CM | POA: Diagnosis not present

## 2018-09-08 DIAGNOSIS — N182 Chronic kidney disease, stage 2 (mild): Secondary | ICD-10-CM | POA: Diagnosis not present

## 2018-09-08 LAB — HEPATIC FUNCTION PANEL
ALT: 16 U/L (ref 0–44)
AST: 19 U/L (ref 15–41)
Albumin: 3.4 g/dL — ABNORMAL LOW (ref 3.5–5.0)
Alkaline Phosphatase: 68 U/L (ref 38–126)
Bilirubin, Direct: <0.1 mg/dL (ref 0.0–0.2)
Total Bilirubin: 0.7 mg/dL (ref 0.3–1.2)
Total Protein: 7.3 g/dL (ref 6.5–8.1)

## 2018-09-08 LAB — BASIC METABOLIC PANEL
Anion gap: 11 (ref 5–15)
BUN: 12 mg/dL (ref 8–23)
CO2: 29 mmol/L (ref 22–32)
Calcium: 9.8 mg/dL (ref 8.9–10.3)
Chloride: 93 mmol/L — ABNORMAL LOW (ref 98–111)
Creatinine, Ser: 1.26 mg/dL — ABNORMAL HIGH (ref 0.44–1.00)
GFR calc Af Amer: 51 mL/min — ABNORMAL LOW (ref >60)
GFR calc non Af Amer: 44 mL/min — ABNORMAL LOW (ref >60)
Glucose, Bld: 226 mg/dL — ABNORMAL HIGH (ref 70–99)
Potassium: 2.8 mmol/L — ABNORMAL LOW (ref 3.5–5.1)
Sodium: 133 mmol/L — ABNORMAL LOW (ref 135–145)

## 2018-09-08 LAB — CBC
HCT: 38.8 % (ref 36.0–46.0)
Hemoglobin: 13.4 g/dL (ref 12.0–15.0)
MCH: 34.4 pg — ABNORMAL HIGH (ref 26.0–34.0)
MCHC: 34.5 g/dL (ref 30.0–36.0)
MCV: 99.5 fL (ref 80.0–100.0)
Platelets: 191 10*3/uL (ref 150–400)
RBC: 3.9 MIL/uL (ref 3.87–5.11)
RDW: 13.1 % (ref 11.5–15.5)
WBC: 7.1 10*3/uL (ref 4.0–10.5)
nRBC: 0 % (ref 0.0–0.2)

## 2018-09-08 LAB — URINALYSIS, ROUTINE W REFLEX MICROSCOPIC
Bilirubin Urine: NEGATIVE
Glucose, UA: NEGATIVE mg/dL
Hgb urine dipstick: NEGATIVE
Ketones, ur: NEGATIVE mg/dL
Leukocytes,Ua: NEGATIVE
Nitrite: NEGATIVE
Protein, ur: NEGATIVE mg/dL
Specific Gravity, Urine: 1.009 (ref 1.005–1.030)
pH: 6 (ref 5.0–8.0)

## 2018-09-08 LAB — LIPASE, BLOOD: Lipase: 38 U/L (ref 11–51)

## 2018-09-08 LAB — I-STAT TROPONIN, ED: Troponin i, poc: 0.02 ng/mL (ref 0.00–0.08)

## 2018-09-08 MED ORDER — METOPROLOL TARTRATE 5 MG/5ML IV SOLN
5.0000 mg | Freq: Once | INTRAVENOUS | Status: AC
  Administered 2018-09-08: 5 mg via INTRAVENOUS
  Filled 2018-09-08: qty 5

## 2018-09-08 MED ORDER — POTASSIUM CHLORIDE 10 MEQ/100ML IV SOLN
10.0000 meq | Freq: Once | INTRAVENOUS | Status: AC
  Administered 2018-09-08: 10 meq via INTRAVENOUS
  Filled 2018-09-08: qty 100

## 2018-09-08 MED ORDER — POTASSIUM CHLORIDE CRYS ER 20 MEQ PO TBCR: 40.0000 meq | EXTENDED_RELEASE_TABLET | Freq: Once | ORAL | Status: DC

## 2018-09-08 MED ORDER — POTASSIUM CHLORIDE CRYS ER 20 MEQ PO TBCR: EXTENDED_RELEASE_TABLET | ORAL | 0 refills | Status: DC

## 2018-09-08 MED ORDER — METOPROLOL TARTRATE 25 MG PO TABS: 12.5000 mg | ORAL_TABLET | Freq: Two times a day (BID) | ORAL | 0 refills | Status: DC

## 2018-09-08 MED ORDER — SODIUM CHLORIDE 0.9% FLUSH: 3.0000 mL | Freq: Once | INTRAVENOUS | Status: DC

## 2018-09-08 MED ORDER — POTASSIUM CHLORIDE 20 MEQ/15ML (10%) PO SOLN
40.0000 meq | Freq: Once | ORAL | Status: AC
  Administered 2018-09-08: 40 meq via ORAL
  Filled 2018-09-08: qty 30

## 2018-09-08 NOTE — ED Provider Notes (Signed)
[facility] EMERGENCY DEPARTMENT Provider Note   CSN: [id number] Arrival date & time: 09/08/18  9983    History   Chief Complaint Chief Complaint  Patient presents with  . Tachycardia  . Hypertension    HPI [name] Long is a 68 y.o. female.     HPI   69yo female with history of COPD, DM, hypertension, hyperlipidemia, CHF, CKD, atrial fibrillation/flutter on eliquis, presents with concern for elevated blood pressure, elevated heart rate, shortness of breath, urinary frequency, abdominal pain.  Stopped ativan a few months.   Biggest concern is blood pressure 150s Dr. Doylene Canard is Cardiologist, stopped diltiazem after she took it for one month then stopped it in fall.  Over the last 2 days blood pressures and heart rate have been elevated, feels palpitations and dyspnea. No chest pain.   Reports abdominal pain has been chronic over the last year, located in the epigastrium, nothing specifically makes it better or worse.  Missed potassium for a week and just started taking it   Past Medical History:  Diagnosis Date  . Arthritis    "real bad; all over" (07/12/2017)  . Asthma   . BIPOLAR DISORDER 08/31/2006   Qualifier: Diagnosis of  By: Dorathy Daft MD, Marjory Lies    . CHRONIC KIDNEY DISEASE STAGE II (MILD) 09/14/2009   Annotation: eGFR 64 Qualifier: Diagnosis of  By: Jess Barters MD, [name]    . Chronic lower back pain   . COPD (chronic obstructive pulmonary disease) ([place]) 09/23/2010   Diagnosed at [facility] in 2008 (Dr. Annamaria Boots)   . DM (diabetes mellitus) type II controlled with renal manifestation ([place]) 08/31/2006   Qualifier: Diagnosis of  By: Dorathy Daft MD, Marjory Lies    . Dyspnea    "all my life; since 6th grade" (07/12/2017)  . HYPERCHOLESTEROLEMIA 08/31/2006   Intolerance to Lipitor OK on Crestor but medicaid no longer covering    . HYPERTENSION, BENIGN SYSTEMIC 08/31/2006   Qualifier: Diagnosis of  By: Dorathy Daft MD, Marjory Lies    . HYPOTHYROIDISM, UNSPECIFIED 08/31/2006   Qualifier: Diagnosis of  By: Dorathy Daft MD, Marjory Lies    . Leg swelling 03/14/2018  . Pneumonia    "3 times" (07/12/2017)  . Scoliosis      Patient Active Problem List   Diagnosis Date Noted  . Dizziness of unknown cause 08/04/2018  . Other fatigue 07/15/2018  . Tremor 07/15/2018  . Colon cancer screening 02/23/2018  . Tooth pain 10/08/2017  . Diarrhea 10/06/2017  . New onset atrial flutter ([place]) 07/10/2017  . Atrial flutter with rapid ventricular response ([place]) 07/10/2017  . Atrial fibrillation ([place]) 07/05/2017  . COPD (chronic obstructive pulmonary disease) ([place]) 01/22/2017  . Shortness of breath 12/25/2016  . Pedal edema   . Acute on chronic congestive heart failure ([place])   . Irregular heart beat   . Chronic bilateral low back pain without sciatica 08/15/2016  . 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 Impingement syndrome of right shoulder 08/15/2016  . Schizoaffective disorder, bipolar type ([place])   . URI (upper respiratory infection) 03/09/2016  . Muscle strain of chest wall 10/21/2015  . Activities of daily living deficit involving bathing of lower body 08/10/2015  . Hypokalemia 01/22/2015  . Vitamin D deficiency 12/19/2014  . Preventative health care 03/21/2014  . Bronchitis, chronic obstructive w acute bronchitis ([place]) 01/07/2014  . Osteoarthritis, multiple sites 06/08/2011  . Dysuria 02/14/2011  . COPD exacerbation ([place]) 09/23/2010  . Fall 09/23/2010  . CHRONIC KIDNEY DISEASE  STAGE II (MILD) 09/14/2009  . Hypothyroidism 08/31/2006  . Type 2 diabetes mellitus ([place]) 08/31/2006  . HYPERCHOLESTEROLEMIA 08/31/2006  . Tobacco use disorder 08/31/2006  . HYPERTENSION, BENIGN SYSTEMIC 08/31/2006    Past Surgical History:  Procedure Laterality Date  . CESAREAN SECTION    . FOOT FRACTURE SURGERY Right    "steel plate in it"  . FOOT SURGERY     " born w/dislocated foot"  . FRACTURE SURGERY    . KNEE ARTHROSCOPY Right     . TOE SURGERY Bilateral    "both pinky toes"  . TONSILLECTOMY AND ADENOIDECTOMY       OB History   No obstetric history on file.      Home Medications    Prior to Admission medications   Medication Sig Start Date End Date Taking? Authorizing Provider  albuterol (PROVENTIL HFA;VENTOLIN HFA) 108 (90 Base) MCG/ACT inhaler INHALE 2 PUFFS BY MOUTH EVERY 4 HOURS IF NEEDED FOR WHEEZING 05/18/18   Winfrey, Alcario Drought, MD  albuterol (PROVENTIL) (2.5 MG/3ML) 0.083% nebulizer solution Take 3 mLs (2.5 mg total) by nebulization every 6 (six) hours as needed for wheezing or shortness of breath. 04/25/18   Kinnie Feil, MD  amLODipine (NORVASC) 5 MG tablet Take 1 tablet (5 mg total) by mouth daily. 04/12/18   Kathrene Alu, MD  Blood Glucose Monitoring Suppl (ONETOUCH VERIO) w/Device KIT 1 kit by Does not apply route 4 (four) times daily. ICD-10 Code: E11.9. 03/03/17   McDiarmid, Blane Ohara, MD  busPIRone (BUSPAR) 5 MG tablet Take 5 mg by mouth 2 (two) times daily.    [provider]  divalproex (DEPAKOTE) 500 MG DR tablet Take 2 tablets (1,000 mg total) by mouth 2 (two) times daily. 12/26/16   Bufford Lope, DO  ELIQUIS 5 MG TABS tablet TAKE 1 TABLET BY MOUTH TWICE A DAY 08/27/18   Kathrene Alu, MD  Fluticasone-Umeclidin-Vilant 100-62.5-25 MCG/INH AEPB Inhale 100 mcg into the lungs daily. 07/13/18   Kathrene Alu, MD  glucose blood (FREESTYLE TEST STRIPS) test strip Use to test once daily. Dx code E 11.29 05/03/18   Kathrene Alu, MD  glucose blood (FREESTYLE TEST STRIPS) test strip Use once daily.  Diagnosis code E11.29 07/05/18   Leeanne Rio, MD  hydrochlorothiazide (HYDRODIURIL) 25 MG tablet Take 1 tablet (25 mg total) by mouth daily. 04/30/18   Zenia Resides, MD  Lancets (FREESTYLE) lancets Use as instructed 10/24/17   Kathrene Alu, MD  levothyroxine (SYNTHROID) 50 MCG tablet Take 1 tablet (50 mcg total) by mouth daily before breakfast. 03/28/18   Kathrene Alu, MD  metFORMIN (GLUCOPHAGE-XR) 750 MG 24 hr tablet Take 1 tablet (750 mg total) by mouth daily with breakfast. 05/10/18   Winfrey, Alcario Drought, MD  metoprolol tartrate (LOPRESSOR) 25 MG tablet Take 0.5 tablets (12.5 mg total) by mouth 2 (two) times daily for 30 days. 09/08/18 10/08/18  Gareth Morgan, MD  nicotine (NICODERM CQ - DOSED IN MG/24 HOURS) 14 mg/24hr patch Place 1 patch (14 mg total) onto the skin daily. 07/13/17   Dixie Dials, MD  olopatadine (PATANOL) 0.1 % ophthalmic solution Place 1 drop into both eyes 2 (two) times daily as needed for allergies. 10/05/17   Kathrene Alu, MD  omeprazole (PRILOSEC) 20 MG capsule Take 1 capsule (20 mg total) by mouth daily. 05/03/18   Kathrene Alu, MD  [facility] DELICA LANCETS FINE MISC 1 each by Does not apply route 4 (four)  times daily. ICD-10 Code: E11.9 03/03/17   McDiarmid, Blane Ohara, MD  potassium chloride (K-DUR) 10 MEQ tablet Take 1 tablet (10 mEq total) by mouth daily. 08/27/18   Kathrene Alu, MD  potassium chloride SA (K-DUR,KLOR-CON) 20 MEQ tablet Take 45mq of potassium this evening, followed by 224m of potassium twice daily for 4 days, then resume normal dosing. 09/08/18   ScGareth MorganMD  risperiDONE (RISPERDAL) 1 MG tablet Take 1 tablet (1 mg total) by mouth 2 (two) times daily. 06/24/16   Pucilowska, Jolanta B, MD  rosuvastatin (CRESTOR) 20 MG tablet Take 1 tablet (20 mg total) by mouth daily. 02/26/18   WiKathrene AluMD  sitaGLIPtin (JANUVIA) 25 MG tablet Take 1 tablet (25 mg total) by mouth daily. 04/30/18   HeZenia ResidesMD  traMADol (ULTRAM) 50 MG tablet Take 1 tablet (50 mg total) by mouth every 8 (eight) hours as needed for moderate pain. For chronic arthritis 05/10/18   WiKathrene AluMD  trihexyphenidyl (ARTANE) 5 MG tablet Take 1 tablet (5 mg total) by mouth 2 (two) times daily with a meal. 12/27/16   YoBufford LopeDO    Family History Family History  Problem Relation Age of Onset  . Breast cancer Sister      Social History Social History   Tobacco Use  . Smoking status: Current Every Day Smoker    Packs/day: 0.50    Years: 45.00    Pack years: 22.50    Types: Cigarettes  . Smokeless tobacco: Never Used  Substance Use Topics  . Alcohol use: Yes    Comment: 07/12/2017 "stopped 2 yr ago; did have a drink over the holidays recently"  . Drug use: No     Allergies   Citrus; Fish allergy; Shellfish allergy; Adhesive [tape]; Ibuprofen; Lipitor [atorvastatin calcium]; Lisinopril; Other; and Codeine   Review of Systems Review of Systems  Constitutional: Positive for fatigue. Negative for fever.  HENT: Negative for sore throat.   Eyes: Negative for visual disturbance.  Respiratory: Positive for shortness of breath. Negative for cough.   Cardiovascular: Positive for palpitations. Negative for chest pain.  Gastrointestinal: Positive for abdominal pain.  Genitourinary: Positive for dysuria. Negative for difficulty urinating.  Musculoskeletal: Negative for back pain and neck pain.  Skin: Negative for rash.  Neurological: Negative for syncope.     Physical Exam Updated Vital Signs BP 125/89   Pulse 83   Temp 98.1 F (36.7 C) (Oral)   Resp (!) 23   SpO2 98%   Physical Exam Vitals signs and nursing note reviewed.  Constitutional:      General: She is not in acute distress.    Appearance: She is well-developed. She is not diaphoretic.  HENT:     Head: Normocephalic and atraumatic.  Eyes:     Conjunctiva/sclera: Conjunctivae normal.  Neck:     Musculoskeletal: Normal range of motion.  Cardiovascular:     Rate and Rhythm: Tachycardia present. Rhythm regularly irregular.     Heart sounds: Normal heart sounds. No murmur. No friction rub. No gallop.   Pulmonary:     Effort: Pulmonary effort is normal. No respiratory distress.     Breath sounds: Normal breath sounds. No wheezing or rales.  Abdominal:     General: There is no distension.     Palpations: Abdomen is soft.      Tenderness: There is no abdominal tenderness. There is no guarding.  Musculoskeletal:        General:  No tenderness.  Skin:    General: Skin is warm and dry.     Findings: No erythema or rash.  Neurological:     Mental Status: She is alert and oriented to person, place, and time.      ED Treatments / Results  Labs (all labs ordered are listed, but only abnormal results are displayed) Labs Reviewed  BASIC METABOLIC PANEL - Abnormal; Notable for the following components:      Result Value   Sodium 133 (*)    Potassium 2.8 (*)    Chloride 93 (*)    Glucose, Bld 226 (*)    Creatinine, Ser 1.26 (*)    GFR calc non Af Amer 44 (*)    GFR calc Af Amer 51 (*)    All other components within normal limits  CBC - Abnormal; Notable for the following components:   MCH 34.4 (*)    All other components within normal limits  HEPATIC FUNCTION PANEL - Abnormal; Notable for the following components:   Albumin 3.4 (*)    All other components within normal limits  URINALYSIS, ROUTINE W REFLEX MICROSCOPIC - Abnormal; Notable for the following components:   Color, Urine STRAW (*)    All other components within normal limits  LIPASE, BLOOD  I-STAT TROPONIN, ED    EKG EKG Interpretation  Date/Time:  Saturday September 08 2018 02:47:10 EST Ventricular Rate:  126 PR Interval:  162 QRS Duration: 74 QT Interval:  338 QTC Calculation: 489 R Axis:   89 Text Interpretation:  Atrial fibrillation with rapid ventricular rate Anterior infarct , age undetermined Abnormal ECG Since priro ECG< rate has increased, rhythm appears to be atrial fibrillation Confirmed by Gareth Morgan [phone]) on 09/08/2018 3:23:19 AM   Radiology Dg Chest 2 View  Result Date: 09/08/2018 CLINICAL DATA:  Shortness of breath. EXAM: CHEST - 2 VIEW COMPARISON:  07/10/2017 FINDINGS: The cardiomediastinal contours are unchanged with normal heart size. Minimal linear scarring/atelectasis at the right lung base. Pulmonary vasculature is  normal. No consolidation, pleural effusion, or pneumothorax. Scoliotic curvature of the spine. No acute osseous abnormalities are seen. IMPRESSION: No acute chest findings.  Right basilar atelectasis or scarring. Electronically Signed   By: Keith Rake M.D.   On: 09/08/2018 03:15    Procedures Procedures (including critical care time)  Medications Ordered in ED Medications  metoprolol tartrate (LOPRESSOR) injection 5 mg (5 mg Intravenous Given 09/08/18 0422)  potassium chloride 10 mEq in 100 mL IVPB (0 mEq Intravenous Stopped 09/08/18 0816)  potassium chloride 20 MEQ/15ML (10%) solution 40 mEq (40 mEq Oral Given 09/08/18 0741)     Initial Impression / Assessment and Plan / ED Course  I have reviewed the triage vital signs and the nursing notes.  Pertinent labs & imaging results that were available during my care of the patient were reviewed by me and considered in my medical decision making (see chart for details).        68yo female with history of COPD, DM, hypertension, hyperlipidemia, CHF, CKD, atrial fibrillation/flutter on eliquis, presents with concern for elevated blood pressure, elevated heart rate, shortness of breath, urinary frequency, abdominal pain.  Abdominal pain has been ongoing over last year, hx and exam not consistent with cholecystitis, SBO, appendicitis.    CXR without pneumonia or edema.  Troponin negative. No anemia.  K 2.8, given po and IV K replacement and rx for same.  Mild AKI noted, recommend close PCP follow up.  Atrial fibrillation with RVR on  arrival to ED, given 78m IV metoprolol with improvement in heart rate, in and out of sinus rhythm, symptoms improved.  Suspect dyspnea was secondary to afib with RVR. Doubt PE given pt on eliquis and reports compliance.    Given rx for 12.541mmetoprolol BID, K rx. Patient discharged in stable condition with understanding of reasons to return.   Final Clinical Impressions(s) / ED Diagnoses   Final diagnoses:    Paroxysmal atrial fibrillation ([facility] Hyp[facility]alemia  [facility]   ED Discharge Orders         Ordered    metoprolol tartrate (LOPRESSOR) 25 MG tablet  2 times daily     09/08/18 0807    potassium chloride SA (K-DUR,KLOR-CON) 20 MEQ tablet     09/08/18 [id number]         ScGareth MorganMD 09/08/18 2204

## 2018-09-08 NOTE — ED Notes (Signed)
Patient verbalizes understanding of discharge instructions. Opportunity for questioning and answers were provided. Armband removed by staff, pt discharged from ED via wheelchair to home.  

## 2018-09-08 NOTE — ED Triage Notes (Signed)
C/o elevated BP, increased HR, and SOB x 1 week.  Denies CP.

## 2018-09-10 ENCOUNTER — Telehealth: Payer: Self-pay

## 2018-09-10 DIAGNOSIS — J449 Chronic obstructive pulmonary disease, unspecified: Secondary | ICD-10-CM

## 2018-09-10 DIAGNOSIS — R072 Precordial pain: Secondary | ICD-10-CM | POA: Diagnosis not present

## 2018-09-10 DIAGNOSIS — R002 Palpitations: Secondary | ICD-10-CM | POA: Diagnosis not present

## 2018-09-10 DIAGNOSIS — J440 Chronic obstructive pulmonary disease with acute lower respiratory infection: Secondary | ICD-10-CM

## 2018-09-10 DIAGNOSIS — J4520 Mild intermittent asthma, uncomplicated: Secondary | ICD-10-CM | POA: Diagnosis not present

## 2018-09-10 DIAGNOSIS — R0602 Shortness of breath: Secondary | ICD-10-CM | POA: Diagnosis not present

## 2018-09-10 MED ORDER — ALBUTEROL SULFATE (2.5 MG/3ML) 0.083% IN NEBU: 2.5000 mg | INHALATION_SOLUTION | Freq: Four times a day (QID) | RESPIRATORY_TRACT | 12 refills | Status: DC | PRN

## 2018-09-10 NOTE — Telephone Encounter (Signed)
sent 

## 2018-09-13 ENCOUNTER — Encounter: Payer: Self-pay | Admitting: Family Medicine

## 2018-10-01 ENCOUNTER — Other Ambulatory Visit: Payer: Self-pay | Admitting: *Deleted

## 2018-10-01 ENCOUNTER — Telehealth: Payer: Self-pay | Admitting: Family Medicine

## 2018-10-01 NOTE — Telephone Encounter (Signed)
Pt calling again asking Winfrey call her. Pt would like to discuss some medications with Winfrey. Pt began listing off and spelling a bunch of different medications she found out about and would like to change to. I told pt she can discuss those with Winfrey when she calls her. Please advise

## 2018-10-01 NOTE — Telephone Encounter (Signed)
Patient calling about medication management. Blood pressure refill that was called in and she states that she went to the ER  due to Blood Pressure meds wasn't able to get meds for 2 days. States she doesn't need anymore blood pressure meds. Needs to up dose. Please call patient back at [phone].

## 2018-10-02 MED ORDER — METOPROLOL TARTRATE 25 MG PO TABS: 12.5000 mg | ORAL_TABLET | Freq: Two times a day (BID) | ORAL | 0 refills | Status: DC

## 2018-10-02 MED ORDER — METFORMIN HCL 1000 MG PO TABS: 1000.0000 mg | ORAL_TABLET | Freq: Two times a day (BID) | ORAL | 3 refills | Status: DC

## 2018-10-02 MED ORDER — POTASSIUM CHLORIDE CRYS ER 10 MEQ PO TBCR: 10.0000 meq | EXTENDED_RELEASE_TABLET | Freq: Every day | ORAL | 3 refills | Status: DC

## 2018-10-02 MED ORDER — MULTIVITAMIN WOMEN PO TABS: 1.0000 | ORAL_TABLET | Freq: Every day | ORAL | 3 refills | Status: DC

## 2018-10-02 NOTE — Telephone Encounter (Signed)
Discussed with [name] Long that she should continue taking her Eliquis even though she feels like it is causing her to have headaches.  She was recently seen in the emergency department with atrial fibrillation with RVR, so I told her that this medicine is necessary to prevent stroke.  She expressed understanding.  I also refilled her potassium, metoprolol, and we changed her metformin from 750 mg once daily 2000 mg twice daily on patient's request, since her blood sugars have been in the 200s recently.  We discussed that she needs to stay at home as much as possible right now and that after this pandemic is over, I will see her in the clinic to discuss her issues further.

## 2018-10-16 ENCOUNTER — Telehealth (INDEPENDENT_AMBULATORY_CARE_PROVIDER_SITE_OTHER): Payer: Medicare Other | Admitting: Family Medicine

## 2018-10-16 ENCOUNTER — Telehealth: Payer: Self-pay | Admitting: Family Medicine

## 2018-10-16 ENCOUNTER — Other Ambulatory Visit: Payer: Self-pay

## 2018-10-16 DIAGNOSIS — G8929 Other chronic pain: Secondary | ICD-10-CM

## 2018-10-16 DIAGNOSIS — M545 Low back pain, unspecified: Secondary | ICD-10-CM

## 2018-10-16 DIAGNOSIS — E876 Hypokalemia: Secondary | ICD-10-CM

## 2018-10-16 MED ORDER — BACLOFEN 10 MG PO TABS: 10.0000 mg | ORAL_TABLET | Freq: Two times a day (BID) | ORAL | 0 refills | Status: DC | PRN

## 2018-10-16 NOTE — Progress Notes (Signed)
[place] Telemedicine Visit  Patient consented to have virtual visit. Method of visit: Telephone  Encounter participants: Patient: [name] Long - located at home Provider: Annabell Sabal - located at office Others (if applicable): n/a  Chief Complaint: chronic pain  HPI:  Patient with history of longstanding pain.  She has called multiple times today due to pain.  States pain has worsened over the past weeks to months.  Describes vague, aching pains in low back, arm and leg joints.  Also uses the term "muscle spasms" when describing her pains.  Asking to go back on hydrocodone.  He last had this in 2018.  She has been taking tramadol but states this does not improve her pain.  Last refill was an November 2018.    Reports she has been walking with a cane for past several months.    She said no bladder or bowel incontinence.  No lower extremity weakness.  No fevers or chills.  She has not been taking anything for relief.  When asked why she is not taking her tramadol she states "I do not want to put something in my body that does not work."  Reports a history of rheumatoid arthritis as cause for her chronic joint/back pains.    ROS: per HPI  Pertinent PMHx: Chronic bilateral lower back pain.  Chronic hypokalemia  Exam:  Respiratory: Good strong voice.  Normal work of breathing.  No acute distress. Psych:  Not depressed or anxious sounding.  Linear and coherent thought process as evidenced by speech pattern. Did become tearful when discussing her pain at one point.    Assessment/Plan:  1.  Chronic pain: - Discussed that PCP should be one to switch to stronger pain medicine.  She was upset because "PCP is an intern."  Tried to help her understand PCP's position.   - Offered refill of Tramadol at 100 mg.  She is only taking 50 mg BID and still has a few pills left from November, so hasn't been taking this regularly enough to experience relief.   - however,  she adamantly refused.  "I want my hydrocodone back."  This was her only answer for pain relief.   - I have sent in Baclofen as a muscle relaxer for her to see if this helps with her back pain.    2.  Hypokalemia: - chronic.  - she is on daily K-dur.  Still with chronic hypokalemia on lab checks.   - Discussed this could be one reason for muscle spasms   - She didn't want to hear this.  She is convinced her pain is joint pain from Rheumatoid arthritis.  I could find no diagnosis for this in her chart.   Time spent during visit with patient: 18 minutes

## 2018-10-16 NOTE — Telephone Encounter (Signed)
Pt called to get med refilled for HYDROcodone [facility]). Please give patient a call.

## 2018-10-16 NOTE — Telephone Encounter (Signed)
Transferred pt to speak with Dr.Walden due to pt calling again about pain.

## 2018-10-16 NOTE — Telephone Encounter (Signed)
Pt called stating that she doesn't want anymore tramadol and that she would like to be back on hydrocortisone. She stated that  Dr. Sharol Given did her xrays, and pt wants Dr.Winfrey to give her a call.

## 2018-10-16 NOTE — Telephone Encounter (Signed)
Routed note to Dr. Shan Levans. [name] Long, CMA

## 2018-10-17 NOTE — Telephone Encounter (Signed)
Returned [name] Long call concerning her pain and her request for hydrocodone.  She says that her pain occurs not only in her back but in her feet and legs as well.  It is making daily activity very difficult for her.  She would like to restart hydrocodone to address this pain.  She says that tramadol has not been working, and hydrocodone would be better for her since "it has three medications in one."  She has not tried any back stretching or exercises because these hurt too much.  She is not open to trying tramadol at an increased dose or trying tramadol with Tylenol.  She says that she has been trying Aleve and Tylenol without much relief.  I explained to Ms. Volante that hydrocodone is a controlled medication that is not indicated for chronic back pain.  I told her that it can cause confusion and constipation among other side effects, and it would not be good medical care for me to prescribe this medication.  I offered to give her a short course of meloxicam and explained that this was a very strong anti-inflammatory medication that would likely better address her back pain.  She was not open to this idea and said that she wanted to change to a different clinic.  I told her she was welcome to find another provider or another clinic if that suited her.  She then hung up.  Ms. Melendrez has social, economic, cognitive, and psychological stressors that would only be harmed by restarting a chronic narcotic medication.  She has walked out of clinic visits previously when I have chosen not to give her a narcotic pain medication.  I will avoid prescribing this medication for her.

## 2018-10-27 ENCOUNTER — Other Ambulatory Visit: Payer: Self-pay | Admitting: Family Medicine

## 2018-11-12 ENCOUNTER — Other Ambulatory Visit: Payer: Self-pay | Admitting: Family Medicine

## 2018-11-12 MED ORDER — OMEPRAZOLE 20 MG PO CPDR: 20.0000 mg | DELAYED_RELEASE_CAPSULE | Freq: Every day | ORAL | 1 refills | Status: DC

## 2018-11-12 MED ORDER — POTASSIUM CHLORIDE ER 10 MEQ PO TBCR: 10.0000 meq | EXTENDED_RELEASE_TABLET | Freq: Every day | ORAL | 2 refills | Status: DC

## 2018-11-12 NOTE — Telephone Encounter (Signed)
Pt called about a refill on her omeprazole 20 MG, and her potassium chloride 10 MEG. Please give pt a call back.

## 2018-11-12 NOTE — Telephone Encounter (Signed)
Prescriptions refilled.    Thanks

## 2018-11-13 NOTE — Telephone Encounter (Signed)
Pt informed of below.[name] Long, CMA ? ?

## 2018-11-19 ENCOUNTER — Other Ambulatory Visit: Payer: Self-pay

## 2018-11-19 ENCOUNTER — Telehealth (INDEPENDENT_AMBULATORY_CARE_PROVIDER_SITE_OTHER): Payer: Medicare Other | Admitting: Family Medicine

## 2018-11-19 DIAGNOSIS — G8929 Other chronic pain: Secondary | ICD-10-CM | POA: Diagnosis not present

## 2018-11-20 ENCOUNTER — Telehealth: Payer: Self-pay | Admitting: Family Medicine

## 2018-11-21 DIAGNOSIS — F209 Schizophrenia, unspecified: Secondary | ICD-10-CM | POA: Diagnosis not present

## 2018-11-21 NOTE — Telephone Encounter (Signed)
Pt is wanting refills on her Hydrocodone and the ear drops . She doesn't understand why this will not be called in. Please call patient to discuss. jw

## 2018-11-22 NOTE — Telephone Encounter (Signed)
Returned [name] Long call regarding her request for eardrops and hydrocodone.  She says that she has shooting pain in both of her ears and a swollen node in her neck that she feels is connected to her ear pain.  She says that she has used eardrops a year ago and would like a refill of these.  She also says that she has had musculoskeletal aches and pains and that her knee is swollen and she has to walk with a limp.  She says that the tramadol and the meloxicam are not helpful for her.  She has also tried Aleve which has not been helpful.  She says that hydrocodone has been the only thing that works for her.  I told her that I would be happy to prescribe her the eardrops but I do not believe that hydrocodone is appropriate for the pain that she is describing, which sounds like arthritis to me.  I told her that she may benefit from making an appointment for these concerns to be addressed in person.  She is already taking several psychotropic medications and has risk factors for opioid dependence, so hydrocodone would present more harm than benefits for her.  Patient became very upset when I said this to her and said that she would like to have a different doctor.  I told her she was welcome to find another doctor if that is what she wanted.  She said she did not want me to refill any of her medications including the eardrops or the meloxicam.  Amanda C. Shan Levans, MD PGY-2, [facility] 11/22/2018 9:54 AM

## 2018-11-27 ENCOUNTER — Telehealth: Payer: Self-pay | Admitting: Family Medicine

## 2018-11-27 NOTE — Telephone Encounter (Signed)
Pt is checking on the status of her eardrops. jw

## 2018-11-28 ENCOUNTER — Other Ambulatory Visit: Payer: Self-pay | Admitting: Family Medicine

## 2018-11-28 MED ORDER — CIPROFLOXACIN-DEXAMETHASONE 0.3-0.1 % OT SUSP: 4.0000 [drp] | Freq: Two times a day (BID) | OTIC | 0 refills | Status: AC

## 2018-11-28 NOTE — Telephone Encounter (Signed)
Ear drops have been sent in.  If her ear pain persists after completing a one week course, she will need to come in to have her ears examined.

## 2018-11-28 NOTE — Telephone Encounter (Signed)
Per my last telephone note, [name] Long did not want me to send in her ear drops because she wanted a different doctor since I would not prescribe her hydrocodone.  I asked her specifically if she wanted me to refill her ear drops and she declined.

## 2018-11-28 NOTE — Telephone Encounter (Signed)
Pt informed. Pt very appreciative and will come in if these drops don't help. [name] Long, CMA

## 2018-11-28 NOTE — Telephone Encounter (Signed)
Pt called back and she still would like to have her ear drops called in to her pharmacy. She really needs this. She didn't mean to say that she didn't want her to call in her medication for ears. jw

## 2018-12-03 DIAGNOSIS — G8929 Other chronic pain: Secondary | ICD-10-CM | POA: Insufficient documentation

## 2018-12-03 NOTE — Progress Notes (Signed)
[place] Telemedicine Visit  Patient consented to have virtual visit. Method of visit: Telephone  Encounter participants: Patient: [name] Long - located at home Provider: Sherene Sires - located at clinic Others (if applicable): none  Chief Complaint: Chronic pain medication  HPI: Patient notes that she has chronic pain.  She states that Dr. Mingo Amber had promised her a opioid prescription and that the pharmacy tells her it never came through.  Upon chart review with the patient it was documented that Dr. Georgena Spurling specifically said he would not be prescribing this medication.  When patient was informed of this she became frustrated and requested that I write the medication.  Upon further chart review it is been agreed upon by both Dr. Shan Levans and Dr. Mingo Amber that this medication is inappropriate for chronic pain.  I can find no medical indication to disagree with this decision and I agree with their plan of action.  I informed the patient and while she was upset she take me for my time and told me that she would consider seeking care elsewhere.  She was disinterested in discussion of alternate medication.  She claimed that she did have a prior diagnosis of rheumatoid, although I could find no objective finding of this in her chart.  Offered her referral and was told "I have hard to talk to those people for and I do not need it again"  ROS: per HPI  Pertinent PMHx: Chronic pain  Exam:  Respiratory: Patient was speaking in full sentences with no respiratory distress.  She was frustrated with some of the responses that she got but was processing information appropriately and was civil  Assessment/Plan:  Other chronic pain Patient offered rheumatoid referral for claim of rheumatoid arthritis that was not found in chart, declined.  Patient also offered to discuss alternate medication to chronic opioids, declined.  I am the third physician document refusal of narcotics as  an appropriate chronic pain medication for this patient.  I have been told that she will consider seeking care elsewhere which is her right should she choose    Time spent during visit with patient: 8 minutes

## 2018-12-03 NOTE — Assessment & Plan Note (Signed)
Patient offered rheumatoid referral for claim of rheumatoid arthritis that was not found in chart, declined.  Patient also offered to discuss alternate medication to chronic opioids, declined.  I am the third physician document refusal of narcotics as an appropriate chronic pain medication for this patient.  I have been told that she will consider seeking care elsewhere which is her right should she choose

## 2018-12-06 DIAGNOSIS — R0602 Shortness of breath: Secondary | ICD-10-CM | POA: Diagnosis not present

## 2018-12-06 DIAGNOSIS — I10 Essential (primary) hypertension: Secondary | ICD-10-CM | POA: Diagnosis not present

## 2018-12-06 DIAGNOSIS — J4520 Mild intermittent asthma, uncomplicated: Secondary | ICD-10-CM | POA: Diagnosis not present

## 2018-12-06 DIAGNOSIS — R072 Precordial pain: Secondary | ICD-10-CM | POA: Diagnosis not present

## 2018-12-10 ENCOUNTER — Ambulatory Visit: Payer: Medicare Other

## 2018-12-12 ENCOUNTER — Ambulatory Visit (INDEPENDENT_AMBULATORY_CARE_PROVIDER_SITE_OTHER): Payer: Medicare Other | Admitting: Student in an Organized Health Care Education/Training Program

## 2018-12-12 ENCOUNTER — Other Ambulatory Visit: Payer: Self-pay

## 2018-12-12 ENCOUNTER — Encounter: Payer: Self-pay | Admitting: Student in an Organized Health Care Education/Training Program

## 2018-12-12 VITALS — BP 120/60 | HR 113 | Wt 156.0 lb

## 2018-12-12 DIAGNOSIS — M150 Primary generalized (osteo)arthritis: Secondary | ICD-10-CM | POA: Diagnosis not present

## 2018-12-12 DIAGNOSIS — M26659 Arthropathy of unspecified temporomandibular joint: Secondary | ICD-10-CM

## 2018-12-12 DIAGNOSIS — M8949 Other hypertrophic osteoarthropathy, multiple sites: Secondary | ICD-10-CM

## 2018-12-12 DIAGNOSIS — G8929 Other chronic pain: Secondary | ICD-10-CM | POA: Diagnosis not present

## 2018-12-12 DIAGNOSIS — M159 Polyosteoarthritis, unspecified: Secondary | ICD-10-CM

## 2018-12-12 DIAGNOSIS — M26609 Unspecified temporomandibular joint disorder, unspecified side: Secondary | ICD-10-CM

## 2018-12-12 MED ORDER — ACETAMINOPHEN ER 650 MG PO TBCR: 650.0000 mg | EXTENDED_RELEASE_TABLET | Freq: Three times a day (TID) | ORAL | 1 refills | Status: DC | PRN

## 2018-12-12 NOTE — Assessment & Plan Note (Signed)
Chronic, no changes. Reduced ADL 2/2 pain - prescribed tylenol arthritis - put in home health PT/OT referral. Patient was appreciative
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
## 2018-12-12 NOTE — Assessment & Plan Note (Signed)
With arthralgia and exam positive for crepitus.  - patient is due for dental visit which was delayed 2/2 COVID shutdowns. I recommended she follow up with them at earliest availability to make plan for treatment - prescribed tylenol arthritis
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
## 2018-12-13 ENCOUNTER — Telehealth: Payer: Self-pay

## 2018-12-13 NOTE — Telephone Encounter (Signed)
Patient calls nurse line stating the provider who saw her yesterday was suppose to call in Tylenol with Codeine and an antibiotic for her mucus. Per note, I do not see where this is the case. Just checking. Please advise.

## 2018-12-14 NOTE — Telephone Encounter (Signed)
Hey,  I did tell her I would prescribe tylenol plain. Never said anything about codeine.  And we never even discussed anything about mucus and her exam was not positive for infection at all so I did not say I would prescribe an antibiotic.  I can call to clarify if she needs or she can come in for another appointment to discuss but I'm not prescribing those things at this time. Thanks

## 2018-12-15 ENCOUNTER — Other Ambulatory Visit: Payer: Self-pay | Admitting: Family Medicine

## 2018-12-15 DIAGNOSIS — E876 Hypokalemia: Secondary | ICD-10-CM | POA: Diagnosis not present

## 2018-12-15 DIAGNOSIS — M150 Primary generalized (osteo)arthritis: Secondary | ICD-10-CM | POA: Diagnosis not present

## 2018-12-15 DIAGNOSIS — M545 Low back pain: Secondary | ICD-10-CM | POA: Diagnosis not present

## 2018-12-15 DIAGNOSIS — G8929 Other chronic pain: Secondary | ICD-10-CM | POA: Diagnosis not present

## 2018-12-15 DIAGNOSIS — M2669 Other specified disorders of temporomandibular joint: Secondary | ICD-10-CM | POA: Diagnosis not present

## 2018-12-18 ENCOUNTER — Other Ambulatory Visit: Payer: Self-pay | Admitting: Family Medicine

## 2018-12-19 ENCOUNTER — Other Ambulatory Visit: Payer: Self-pay | Admitting: Family Medicine

## 2018-12-19 MED ORDER — FREESTYLE LANCETS MISC: 10 refills | Status: DC

## 2018-12-19 NOTE — Addendum Note (Signed)
Addended by: Christen Bame D on: 12/19/2018 10:16 AM   Modules accepted: Orders

## 2018-12-19 NOTE — Telephone Encounter (Signed)
Script needed Dx code and [place] certified provider.  Resent under [place] name Production assistant, radio  For AM). Christen Bame, CMA

## 2018-12-21 ENCOUNTER — Telehealth: Payer: Self-pay | Admitting: Family Medicine

## 2018-12-21 NOTE — Telephone Encounter (Signed)
**  After Hours/ Emergency Line Call**  Received a call to report that [name]  For concern of metformin recall.   Patient reports that [facility] ([facility] has called and informed her that metformin was recalled. Has been on metformin for 37 years.   Spoke [facility] at [facility] who confirmed that [facility] [address] was not affected and metformin for patient was safe to take.   Called patient and informed her of this. She states she is still nervous since news reports say metformin can cause cancer. I explained to patient that the lot that sent patient's metformin was not affected but she states she would like her PCP to change her off metformin completely.   Will forward this message to PCP to determine new diabetes regimen.   Caroline More, DO PGY-2, [facility] 12/21/2018 12:31 PM

## 2018-12-24 ENCOUNTER — Other Ambulatory Visit: Payer: Self-pay | Admitting: Cardiovascular Disease

## 2018-12-24 ENCOUNTER — Ambulatory Visit
Admission: RE | Admit: 2018-12-24 | Discharge: 2018-12-24 | Disposition: A | Discharge status: Home / Self Care | Payer: Medicare Other | Source: Ambulatory Visit | Attending: Cardiovascular Disease | Admitting: Cardiovascular Disease

## 2018-12-24 ENCOUNTER — Telehealth: Payer: Self-pay | Admitting: *Deleted

## 2018-12-24 DIAGNOSIS — J40 Bronchitis, not specified as acute or chronic: Secondary | ICD-10-CM

## 2018-12-24 DIAGNOSIS — R0602 Shortness of breath: Secondary | ICD-10-CM | POA: Diagnosis not present

## 2018-12-24 NOTE — Telephone Encounter (Signed)
Pt declined an OT evaluation.  Only wants PT. Christen Bame, CMA

## 2018-12-25 ENCOUNTER — Telehealth: Payer: Self-pay | Admitting: Family Medicine

## 2018-12-25 ENCOUNTER — Telehealth: Payer: Self-pay

## 2018-12-25 NOTE — Telephone Encounter (Signed)
Charis, home health PT, called nurse line stating the patient has declined the past two apts.   If you need to reach [facility], please do so. If not, this is a Pharmacist, hospital.

## 2018-12-25 NOTE — Telephone Encounter (Signed)
Pt is calling and would like to know if she have a new medication for her blood sugar. She said that she is not comfortable taking the metformin "with everything that's been said about it on the news". I informed her that I saw she spoke with Dr. Tammi Klippel who had called her pharmacy and confirmed that the metformin given to her was not apart of the recall.   Pt would still like to switch medications.

## 2018-12-28 ENCOUNTER — Other Ambulatory Visit: Payer: Self-pay | Admitting: Family Medicine

## 2018-12-28 NOTE — Telephone Encounter (Signed)
Would you call [name] Long and ask if you can have her come in to make an appointment to discuss her diabetes regimen with me?  I would like to switch a few of her medications and also check some blood work if possible.

## 2018-12-31 NOTE — Telephone Encounter (Signed)
Contacted pt and she is going to come in on 01/11/2019 to go over this. April Zimmerman Rumple, CMA

## 2019-01-01 ENCOUNTER — Encounter: Payer: Self-pay | Admitting: Orthopedic Surgery

## 2019-01-01 ENCOUNTER — Ambulatory Visit (INDEPENDENT_AMBULATORY_CARE_PROVIDER_SITE_OTHER): Payer: Medicare Other | Admitting: Orthopedic Surgery

## 2019-01-01 ENCOUNTER — Other Ambulatory Visit: Payer: Self-pay

## 2019-01-01 VITALS — Ht 65.0 in | Wt 156.0 lb

## 2019-01-01 DIAGNOSIS — M7541 Impingement syndrome of right shoulder: Secondary | ICD-10-CM | POA: Diagnosis not present

## 2019-01-01 DIAGNOSIS — M1712 Unilateral primary osteoarthritis, left knee: Secondary | ICD-10-CM | POA: Diagnosis not present

## 2019-01-01 DIAGNOSIS — M7542 Impingement syndrome of left shoulder: Secondary | ICD-10-CM

## 2019-01-05 ENCOUNTER — Encounter: Payer: Self-pay | Admitting: Orthopedic Surgery

## 2019-01-05 DIAGNOSIS — M1712 Unilateral primary osteoarthritis, left knee: Secondary | ICD-10-CM | POA: Diagnosis not present

## 2019-01-05 MED ORDER — METHYLPREDNISOLONE ACETATE 40 MG/ML IJ SUSP
40.0000 mg | INTRAMUSCULAR | Status: AC | PRN
  Administered 2019-01-05: 40 mg via INTRA_ARTICULAR

## 2019-01-05 MED ORDER — LIDOCAINE HCL 1 % IJ SOLN
5.0000 mL | INTRAMUSCULAR | Status: AC | PRN
  Administered 2019-01-05: 5 mL

## 2019-01-05 NOTE — Progress Notes (Signed)
Office Visit Note   Patient: [name] Long           Date of Birth: [birth year]           MRN: [id number] Visit Date: 01/01/2019              Requested by: Kathrene Alu, MD [phone],  [place] [address] PCP: Kathrene Alu, MD  Chief Complaint  Patient presents with  . Right Knee - Pain  . Left Knee - Pain      HPI: Patient is a 68 year old woman who presents complaining of increasing arthritic pain in her left knee.  She has had right knee symptoms before she complains of spasms locking and catching in the left knee.  She states that about 5 months ago she fell on her left knee.  She also complains of pain in the anterior aspect of both shoulders.  Assessment & Plan: Visit Diagnoses: No diagnosis found.  Plan: Plan for evaluation for hyaluronic acid injection for her left knee patient is not ready to consider total knee arthroplasty at this time.  She does have impingement symptoms of both shoulders and will evaluated follow-up for possible subacromial injections.  She has arthritic changes of the PIP and DIP joints of both hands.  Follow-Up Instructions: No follow-ups on file.   Ortho Exam  Patient is alert, oriented, no adenopathy, well-dressed, normal affect, normal respiratory effort. Examination patient has pain with Neer and Hawkins impingement test of both shoulders.  She has catching and locking with range of motion of the left knee collaterals and cruciates are stable patella tracks midline.  Examination of the both hands she has Heberden's and Bouchard's nodes of the PIP and DIP joints.  There is no redness or cellulitis or effusion of the left knee  Imaging: No results found. No images are attached to the encounter.  Labs: Lab Results  Component Value Date   HGBA1C 7.1 (A) 07/13/2018   HGBA1C 8.3 (A) 04/16/2018   HGBA1C 7.7 (A) 12/25/2017   LABORGA NO GROWTH 12/04/2014     Lab Results  Component Value Date   ALBUMIN 3.4 (L)  09/08/2018   ALBUMIN 3.3 (L) 12/06/2017   ALBUMIN 2.8 (L) 07/10/2017    Lab Results  Component Value Date   MG 1.7 12/25/2016   MG 1.8 06/10/2016   MG 1.8 02/02/2016   Lab Results  Component Value Date   VD25OH 43.0 02/23/2018   VD25OH 30 05/06/2015   VD25OH 12 (L) 12/18/2014    No results found for: PREALBUMIN CBC EXTENDED Latest Ref Rng & Units 09/08/2018 12/06/2017 12/06/2017  WBC 4.0 - 10.5 K/uL 7.1 - 6.2  RBC 3.87 - 5.11 MIL/uL 3.90 - 3.70(L)  HGB 12.0 - 15.0 g/dL 13.4 14.3 13.0  HCT 36.0 - 46.0 % 38.8 42.0 38.1  PLT 150 - 400 K/uL 191 - 178  NEUTROABS 1.7 - 7.7 K/uL - - 2.2  LYMPHSABS 0.7 - 4.0 K/uL - - 3.2     Body mass index is 25.96 kg/m.  Orders:  No orders of the defined types were placed in this encounter.  No orders of the defined types were placed in this encounter.    Procedures: Large Joint Inj: L knee on 01/05/2019 9:43 AM Indications: pain and diagnostic evaluation Details: 22 G 1.5 in needle, anteromedial approach  Arthrogram: No  Medications: 5 mL lidocaine 1 %; 40 mg methylPREDNISolone acetate 40 MG/ML Outcome: tolerated well, no immediate complications Procedure,  treatment alternatives, risks and benefits explained, specific risks discussed. Consent was given by the patient. Immediately prior to procedure a time out was called to verify the correct patient, procedure, equipment, support staff and site/side marked as required. Patient was prepped and draped in the usual sterile fashion.      Clinical Data: No additional findings.  ROS:  All other systems negative, except as noted in the HPI. Review of Systems  Objective: Vital Signs: Ht _0  (1.651 m)   Wt 156 lb (70.8 kg)   BMI 25.96 kg/m   Specialty Comments:  No specialty comments available.  PMFS History: Patient Active Problem List   Diagnosis Date Noted  . 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 Other chronic pain 12/03/2018  . Dizziness of unknown cause 08/04/2018  . Other fatigue  07/15/2018  . Tremor 07/15/2018  . Colon cancer screening 02/23/2018  . Tooth pain 10/08/2017  . Diarrhea 10/06/2017  . New onset atrial flutter ([place]) 07/10/2017  . Atrial flutter with rapid ventricular response ([place]) 07/10/2017  . Atrial fibrillation ([place]) 07/05/2017  . COPD (chronic obstructive pulmonary disease) ([place]) 01/22/2017  . Shortness of breath 12/25/2016  . Pedal edema   . Acute on chronic congestive heart failure ([place])   . Irregular heart beat   . Chronic bilateral low back pain without sciatica 08/15/2016  . 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 Impingement syndrome of right shoulder 08/15/2016  . Schizoaffective disorder, bipolar type ([place])   . URI (upper respiratory infection) 03/09/2016  . Muscle strain of chest wall 10/21/2015  . Activities of daily living deficit involving bathing of lower body 08/10/2015  . Hypokalemia 01/22/2015  . Vitamin D deficiency 12/19/2014  . Preventative health care 03/21/2014  . Bronchitis, chronic obstructive w acute bronchitis ([place]) 01/07/2014  . Osteoarthritis, multiple sites 06/08/2011  . Dysuria 02/14/2011  . COPD exacerbation ([place]) 09/23/2010  . Fall 09/23/2010  . CHRONIC KIDNEY DISEASE STAGE II ([place]) 09/14/2009  . Hypothyroidism 08/31/2006  . Type 2 diabetes mellitus ([place]) 08/31/2006  . HYPERCHOLESTEROLEMIA 08/31/2006  . Tobacco use disorder 08/31/2006  . HYPERTENSION, BENIGN SYSTEMIC 08/31/2006   Past Medical History:  Diagnosis Date  . Arthritis    "real bad; all over" (07/12/2017)  . Asthma   . BIPOLAR DISORDER 08/31/2006   Qualifier: Diagnosis of  By: Dorathy Daft MD, Marjory Lies    . CHRONIC KIDNEY DISEASE STAGE II ([facility]) 09/14/2009   Annotation: eGFR 64 Qualifier: Diagnosis of  By: Jess Barters MD, [name]    . Chronic lower back pain   . COPD (chronic obstructive pulmonary disease) ([place]) 09/23/2010   Diagnosed at [facility] in 2008 (Dr. Annamaria Boots)   . DM (diabetes mellitus) type  II controlled with renal manifestation ([place]) 08/31/2006   Qualifier: Diagnosis of  By: Dorathy Daft MD, Marjory Lies    . Dyspnea    "all my life; since 6th grade" (07/12/2017)  . HYPERCHOLESTEROLEMIA 08/31/2006   Intolerance to Lipitor OK on Crestor but medicaid no longer covering    . HYPERTENSION, BENIGN SYSTEMIC 08/31/2006   Qualifier: Diagnosis of  By: Dorathy Daft MD, Marjory Lies    . HYPOTHYROIDISM, UNSPECIFIED 08/31/2006   Qualifier: Diagnosis of  By: Dorathy Daft MD, Marjory Lies    . Leg swelling 03/14/2018  . Pneumonia    "3 times" (07/12/2017)  . Scoliosis     Family History  Problem Relation Age of Onset  . Breast cancer Sister     Past Surgical History:  Procedure Laterality Date  .  CESAREAN SECTION    . FOOT FRACTURE SURGERY Right    "steel plate in it"  . FOOT SURGERY     " born w/dislocated foot"  . FRACTURE SURGERY    . KNEE ARTHROSCOPY Right   . TOE SURGERY Bilateral    "both pinky toes"  . TONSILLECTOMY AND ADENOIDECTOMY     Social History   Occupational History  . Not on file  Tobacco Use  . Smoking status: Current Every Day Smoker    Packs/day: 0.50    Years: 45.00    Pack years: 22.50    Types: Cigarettes  . Smokeless tobacco: Never Used  Substance and Sexual Activity  . Alcohol use: Yes    Comment: 07/12/2017 "stopped 2 yr ago; did have a drink over the holidays recently"  . Drug use: No  . Sexual activity: Yes

## 2019-01-08 DIAGNOSIS — J4520 Mild intermittent asthma, uncomplicated: Secondary | ICD-10-CM | POA: Diagnosis not present

## 2019-01-08 DIAGNOSIS — R0602 Shortness of breath: Secondary | ICD-10-CM | POA: Diagnosis not present

## 2019-01-08 DIAGNOSIS — R072 Precordial pain: Secondary | ICD-10-CM | POA: Diagnosis not present

## 2019-01-08 DIAGNOSIS — M25562 Pain in left knee: Secondary | ICD-10-CM | POA: Diagnosis not present

## 2019-01-09 ENCOUNTER — Telehealth: Payer: Self-pay

## 2019-01-09 NOTE — Telephone Encounter (Signed)
Submitted VOB for Monovisc, left knee. 

## 2019-01-11 ENCOUNTER — Other Ambulatory Visit: Payer: Self-pay

## 2019-01-11 ENCOUNTER — Encounter: Payer: Self-pay | Admitting: Family Medicine

## 2019-01-11 ENCOUNTER — Ambulatory Visit (INDEPENDENT_AMBULATORY_CARE_PROVIDER_SITE_OTHER): Payer: Medicare Other | Admitting: Family Medicine

## 2019-01-11 VITALS — BP 132/78 | HR 84

## 2019-01-11 DIAGNOSIS — E1122 Type 2 diabetes mellitus with diabetic chronic kidney disease: Secondary | ICD-10-CM

## 2019-01-11 DIAGNOSIS — F250 Schizoaffective disorder, bipolar type: Secondary | ICD-10-CM

## 2019-01-11 DIAGNOSIS — E039 Hypothyroidism, unspecified: Secondary | ICD-10-CM | POA: Diagnosis not present

## 2019-01-11 DIAGNOSIS — N183 Chronic kidney disease, stage 3 unspecified: Secondary | ICD-10-CM

## 2019-01-11 LAB — POCT GLYCOSYLATED HEMOGLOBIN (HGB A1C): HbA1c, POC (controlled diabetic range): 8.8 % — AB (ref 0.0–7.0)

## 2019-01-11 MED ORDER — METFORMIN HCL 1000 MG PO TABS: 1000.0000 mg | ORAL_TABLET | Freq: Two times a day (BID) | ORAL | 11 refills | Status: DC

## 2019-01-11 NOTE — Progress Notes (Addendum)
Subjective:    [name] Long - 68 y.o. female MRN [id number]  Date of birth: 11-Aug-1950  CC:  [name] Long is here for follow up of type 2 diabetes and her previous diagnosis of schizophrenia.  HPI: Type 2 diabetes Patient was initially concerned that her metformin was included in the recall of this medication, but she was reassured by her heart doctor, Dr. Doylene Canard, that it was safe to continue this medicine.  She would not like to try any other medication currently.  She says that she will drink more Diet Coke and less regular Coke to help control her diabetes.  Diagnosis of schizophrenia Patient says that her previous doctor at this clinic diagnosed with schizophrenia and "wrote to the state" that she has this diagnosis.  She would like to get this diagnosis off of her chart because she does not think she has this mental illness.  She does see a psychiatrist named Fred at [facility] who prescribes her risperidone.  She says she no longer takes Depakote.  She was told that her risperidone was for sleeping and not for a mental illness.  Atrial fibrillation She says that she saw her heart doctor, Dr. Doylene Canard, on Tuesday, July 7.  She says that he told her that she can stop taking her Eliquis.  Health Maintenance:  Health Maintenance Due  Topic Date Due  . COLONOSCOPY  07/24/2001  . FOOT EXAM  04/26/2018  . URINE MICROALBUMIN  04/26/2018  . PNA vac Low Risk Adult (2 of 2 - PPSV23) 04/26/2018  . LIPID PANEL  08/26/2018    -  reports that she has been smoking cigarettes. She has a 22.50 pack-year smoking history. She has never used smokeless tobacco. - Review of Systems: Per HPI. - Past Medical History: Patient Active Problem List   Diagnosis Date Noted  . 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 Other chronic pain 12/03/2018  . Dizziness of unknown cause 08/04/2018  . Other fatigue 07/15/2018  . Tremor 07/15/2018  . Colon cancer screening 02/23/2018  . Tooth pain 10/08/2017  .  Diarrhea 10/06/2017  . New onset atrial flutter ([place]) 07/10/2017  . Atrial flutter with rapid ventricular response ([place]) 07/10/2017  . Atrial fibrillation ([place]) 07/05/2017  . COPD (chronic obstructive pulmonary disease) ([place]) 01/22/2017  . Shortness of breath 12/25/2016  . Pedal edema   . Acute on chronic congestive heart failure ([place])   . Irregular heart beat   . Chronic bilateral low back pain without sciatica 08/15/2016  . 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 Impingement syndrome of right shoulder 08/15/2016  . Schizoaffective disorder, bipolar type ([place])   . URI (upper respiratory infection) 03/09/2016  . Muscle strain of chest wall 10/21/2015  . Activities of daily living deficit involving bathing of lower body 08/10/2015  . Hypokalemia 01/22/2015  . Vitamin D deficiency 12/19/2014  . Preventative health care 03/21/2014  . Bronchitis, chronic obstructive w acute bronchitis ([place]) 01/07/2014  . Osteoarthritis, multiple sites 06/08/2011  . Dysuria 02/14/2011  . COPD exacerbation ([place]) 09/23/2010  . Fall 09/23/2010  . CHRONIC KIDNEY DISEASE STAGE II (MILD) 09/14/2009  . Hypothyroidism 08/31/2006  . Type 2 diabetes mellitus ([place]) 08/31/2006  . HYPERCHOLESTEROLEMIA 08/31/2006  . Tobacco use disorder 08/31/2006  . HYPERTENSION, BENIGN SYSTEMIC 08/31/2006   - Medications: reviewed and updated   Objective:   Physical Exam BP 132/78   Pulse 84   SpO2 98%  Gen: NAD, alert, cooperative with exam,  well-appearing CV: Patient will not allow me to auscultate her heart Resp: No shortness of breath or cough, patient would not allow me to auscultate her lungs Psych: poor insight, alert and oriented        Assessment & Plan:   Type 2 diabetes mellitus (HCC) Hemoglobin A1c has worsened today to 8.8.  Patient is willing to take metformin, but she is not amenable to starting any other medications.  We reviewed changes to her  diet and exercise habits to help improve her A1c without adding other medications.  Schizoaffective disorder, bipolar type [facility]) Patient appears to have longstanding mental illness.  She follows with [facility] and is prescribed risperidone currently, although she has been prescribed Depakote in the past as well.  Unfortunately, her mental illness makes her an unreliable historian.  I am not sure if she actually saw her cardiologist this week since there is no note in our system and she has inaccurately reported seeing him in the past.  She has not wanted to take Eliquis for quite a while, and she has likely been nonadherent to this medication.  The risks of thrombosis in the setting of atrial fibrillation were reviewed with the patient, and she has elected to continue to not take Eliquis.  Regarding her request for her mental illness to be removed from her chart, I told the patient that I will consider doing this if I receive a letter from her psychiatrist stating that she does not have these mental illnesses.  She has expressed that she does not want to continue seeing me as a patient, so I have reached out to our administration to work on finding her new PCP at our clinic.  Will try to schedule an appointment for her to get a PFT done since she is worried about her breathing and does have a history of tobacco use.  We will also obtain a BMP to monitor her kidney function and a TSH to monitor her hypothyroidism today.  Maia Breslow, M.D. 01/14/2019, 8:24 AM PGY-3, [facility]

## 2019-01-11 NOTE — Patient Instructions (Signed)
It was nice seeing you today [name] Long!  For your diabetes, please continue taking metformin and work on reducing the amount of white bread, white rice, and sugary drinks such as regular Coke in your diet.  If you will get your provider at [facility] to send me a letter about what they are taking care of before, that will help me or whoever takes care of the next to make sure all of your diagnoses are correct.  We are getting some labs today, we will call you if any of these labs are not normal.  If you have any questions or concerns, please feel free to call the clinic.   Be well,  Dr. Shan Levans

## 2019-01-12 LAB — BASIC METABOLIC PANEL
BUN/Creatinine Ratio: 15 (ref 12–28)
BUN: 17 mg/dL (ref 8–27)
CO2: 26 mmol/L (ref 20–29)
Calcium: 9.3 mg/dL (ref 8.7–10.3)
Chloride: 96 mmol/L (ref 96–106)
Creatinine, Ser: 1.12 mg/dL — ABNORMAL HIGH (ref 0.57–1.00)
GFR calc Af Amer: 59 mL/min/{1.73_m2} — ABNORMAL LOW (ref >59)
GFR calc non Af Amer: 51 mL/min/{1.73_m2} — ABNORMAL LOW (ref >59)
Glucose: 262 mg/dL — ABNORMAL HIGH (ref 65–99)
Potassium: 3.3 mmol/L — ABNORMAL LOW (ref 3.5–5.2)
Sodium: 139 mmol/L (ref 134–144)

## 2019-01-12 LAB — TSH: TSH: 0.289 u[IU]/mL — ABNORMAL LOW (ref 0.450–4.500)

## 2019-01-14 ENCOUNTER — Telehealth: Payer: Self-pay | Admitting: *Deleted

## 2019-01-14 ENCOUNTER — Other Ambulatory Visit: Payer: Self-pay | Admitting: Family Medicine

## 2019-01-14 MED ORDER — LEVOTHYROXINE SODIUM 25 MCG PO TABS: 25.0000 ug | ORAL_TABLET | Freq: Every day | ORAL | 0 refills | Status: DC

## 2019-01-14 NOTE — Telephone Encounter (Signed)
Left voicemail with patient letting her know that her thyroid hormone is high based on her lab results from Friday, which means we need to reduce her Synthroid dose from 50 mcg once daily to 25 mcg once daily.  I would like for her to come back in in about 6 weeks to have her thyroid level rechecked to ensure that it is responding well to this new dose.

## 2019-01-14 NOTE — Assessment & Plan Note (Signed)
Patient appears to have longstanding mental illness.  She follows with [facility] and is prescribed risperidone currently, although she has been prescribed Depakote in the past as well.  Unfortunately, her mental illness makes her an unreliable historian.  I am not sure if she actually saw her cardiologist this week since there is no note in our system and she has inaccurately reported seeing him in the past.  She has not wanted to take Eliquis for quite a while, and she has likely been nonadherent to this medication.  The risks of thrombosis in the setting of atrial fibrillation were reviewed with the patient, and she has elected to continue to not take Eliquis.  Regarding her request for her mental illness to be removed from her chart, I told the patient that I will consider doing this if I receive a letter from her psychiatrist stating that she does not have these mental illnesses.  She has expressed that she does not want to continue seeing me as a patient, so I have reached out to our administration to work on finding her new PCP at our clinic.

## 2019-01-14 NOTE — Assessment & Plan Note (Signed)
Hemoglobin A1c has worsened today to 8.8.  Patient is willing to take metformin, but she is not amenable to starting any other medications.  We reviewed changes to her diet and exercise habits to help improve her A1c without adding other medications.

## 2019-01-14 NOTE — Telephone Encounter (Signed)
-----   Message from Kathrene Alu, MD sent at 01/14/2019  9:18 AM EDT ----- Which you make an appointment for Ms. Connell to have PFTs done with Dr. Valentina Lucks in the near future?  Thank you for your help.  Estill Bamberg

## 2019-01-15 NOTE — Telephone Encounter (Signed)
See other phone note in reference to this. [name] Long, CMA

## 2019-01-15 NOTE — Telephone Encounter (Signed)
Contacted pt and informed her of below and she stated that she would call back to schedule an appointment.  I also informed her about scheduling PFT's and she said tell her (Dr. Shan Levans) that her heart doctor has done checks on her lungs and that there is no problem with them.  Routing to PCP as an Pharmacist, hospital.April Zimmerman Rumple, CMA

## 2019-01-29 ENCOUNTER — Ambulatory Visit (INDEPENDENT_AMBULATORY_CARE_PROVIDER_SITE_OTHER): Payer: Medicare Other | Admitting: Orthopedic Surgery

## 2019-01-29 ENCOUNTER — Encounter: Payer: Self-pay | Admitting: Orthopedic Surgery

## 2019-01-29 VITALS — Ht 65.0 in | Wt 156.0 lb

## 2019-01-29 DIAGNOSIS — I872 Venous insufficiency (chronic) (peripheral): Secondary | ICD-10-CM | POA: Diagnosis not present

## 2019-01-29 DIAGNOSIS — M62838 Other muscle spasm: Secondary | ICD-10-CM | POA: Diagnosis not present

## 2019-01-29 DIAGNOSIS — M1712 Unilateral primary osteoarthritis, left knee: Secondary | ICD-10-CM | POA: Diagnosis not present

## 2019-01-29 MED ORDER — HYALURONAN 88 MG/4ML IX SOSY
88.0000 mg | PREFILLED_SYRINGE | INTRA_ARTICULAR | Status: AC | PRN
  Administered 2019-01-29: 88 mg via INTRA_ARTICULAR

## 2019-01-29 MED ORDER — LIDOCAINE HCL 1 % IJ SOLN
1.0000 mL | INTRAMUSCULAR | Status: AC | PRN
  Administered 2019-01-29: 1 mL

## 2019-01-29 MED ORDER — PREDNISONE 10 MG PO TABS: 10.0000 mg | ORAL_TABLET | Freq: Every day | ORAL | 0 refills | Status: DC

## 2019-01-29 NOTE — Progress Notes (Signed)
Office Visit Note   Patient: [name] Long           Date of Birth: [birth year]           MRN: [id number] Visit Date: 01/29/2019              Requested by: Kathrene Alu, MD [address],  [place] [address] PCP: Kathrene Alu, MD  No chief complaint on file.     HPI: Patient is a 68 year old woman who presents with 3 separate issues.  #1 she presents for a Monovisc injection for her left knee she has had temporary relief with steroid injections #2 patient complains of aching and pain in all of her joints with muscle spasms, #3 venous stasis swelling in her legs.  Assessment & Plan: Visit Diagnoses:  1. Unilateral primary osteoarthritis, left knee   2. Muscle spasm   3. Venous insufficiency (chronic) (peripheral)     Plan: Recommended she wear compression stockings for her legs we will try low-dose of prednisone patient has kidney disease cannot take nonsteroidals.  Also recommended coconut water for her muscle spasms and also recommended that she stop soda.  Also discussed with the 10 mg prednisone will increase her blood sugars and she should monitor this closely.  Follow-Up Instructions: Return in about 4 weeks (around 02/26/2019).   Ortho Exam  Patient is alert, oriented, no adenopathy, well-dressed, normal affect, normal respiratory effort. On examination patient has a mild effusion of the left knee she is tender to palpation no redness no cellulitis no signs of infection she does have crepitation.  Examination of her legs she has venous stasis swelling with brawny skin color changes.  Patient is on hydrochlorothiazide and does drink sodas.  Discussed that both of these will decrease her electrolytes.  She has brawny skin color changes in her legs but no venous ulcers.  Imaging: No results found. No images are attached to the encounter.  Labs: Lab Results  Component Value Date   HGBA1C 8.8 (A) 01/11/2019   HGBA1C 7.1 (A) 07/13/2018   HGBA1C 8.3  (A) 04/16/2018   LABORGA NO GROWTH 12/04/2014     Lab Results  Component Value Date   ALBUMIN 3.4 (L) 09/08/2018   ALBUMIN 3.3 (L) 12/06/2017   ALBUMIN 2.8 (L) 07/10/2017    Lab Results  Component Value Date   MG 1.7 12/25/2016   MG 1.8 06/10/2016   MG 1.8 02/02/2016   Lab Results  Component Value Date   VD25OH 43.0 02/23/2018   VD25OH 30 05/06/2015   VD25OH 12 (L) 12/18/2014    No results found for: PREALBUMIN CBC EXTENDED Latest Ref Rng & Units 09/08/2018 12/06/2017 12/06/2017  WBC 4.0 - 10.5 K/uL 7.1 - 6.2  RBC 3.87 - 5.11 MIL/uL 3.90 - 3.70(L)  HGB 12.0 - 15.0 g/dL 13.4 14.3 13.0  HCT 36.0 - 46.0 % 38.8 42.0 38.1  PLT 150 - 400 K/uL 191 - 178  NEUTROABS 1.7 - 7.7 K/uL - - 2.2  LYMPHSABS 0.7 - 4.0 K/uL - - 3.2     Body mass index is 25.96 kg/m.  Orders:  Orders Placed This Encounter  Procedures  . Large Joint Inj: L knee   No orders of the defined types were placed in this encounter.    Procedures: Large Joint Inj: L knee on 01/29/2019 4:53 PM Indications: pain and diagnostic evaluation Details: 22 G 1.5 in needle, anteromedial approach  Arthrogram: No  Medications: 1 mL lidocaine 1 %;  88 mg Hyaluronan 88 MG/4ML Outcome: tolerated well, no immediate complications Procedure, treatment alternatives, risks and benefits explained, specific risks discussed. Consent was given by the patient. Immediately prior to procedure a time out was called to verify the correct patient, procedure, equipment, support staff and site/side marked as required. Patient was prepped and draped in the usual sterile fashion.      Clinical Data: No additional findings.  ROS:  All other systems negative, except as noted in the HPI. Review of Systems  Objective: Vital Signs: Ht '5\' 5"'  (1.651 m)   Wt 156 lb (70.8 kg)   BMI 25.96 kg/m   Specialty Comments:  No specialty comments available.  PMFS History: Patient Active Problem List   Diagnosis Date Noted  . TMJ arthropathy  12/12/2018  . Other chronic pain 12/03/2018  . Dizziness of unknown cause 08/04/2018  . Other fatigue 07/15/2018  . Tremor 07/15/2018  . Colon cancer screening 02/23/2018  . Tooth pain 10/08/2017  . Diarrhea 10/06/2017  . New onset atrial flutter ([place]) 07/10/2017  . Atrial flutter with rapid ventricular response ([place]) 07/10/2017  . Atrial fibrillation ([place]) 07/05/2017  . COPD (chronic obstructive pulmonary disease) ([place]) 01/22/2017  . Shortness of breath 12/25/2016  . Pedal edema   . Acute on chronic congestive heart failure ([place])   . Irregular heart beat   . Chronic bilateral low back pain without sciatica 08/15/2016  . 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 Impingement syndrome of right shoulder 08/15/2016  . Schizoaffective disorder, bipolar type ([place])   . URI (upper respiratory infection) 03/09/2016  . Muscle strain of chest wall 10/21/2015  . Activities of daily living deficit involving bathing of lower body 08/10/2015  . Hypokalemia 01/22/2015  . Vitamin D deficiency 12/19/2014  . Preventative health care 03/21/2014  . Bronchitis, chronic obstructive w acute bronchitis ([place]) 01/07/2014  . Osteoarthritis, multiple sites 06/08/2011  . Dysuria 02/14/2011  . COPD exacerbation ([place]) 09/23/2010  . Fall 09/23/2010  . CHRONIC KIDNEY DISEASE STAGE II ([place]) 09/14/2009  . Hypothyroidism 08/31/2006  . Type 2 diabetes mellitus ([place]) 08/31/2006  . HYPERCHOLESTEROLEMIA 08/31/2006  . Tobacco use disorder 08/31/2006  . HYPERTENSION, BENIGN SYSTEMIC 08/31/2006   Past Medical History:  Diagnosis Date  . Arthritis    "real bad; all over" (07/12/2017)  . Asthma   . BIPOLAR DISORDER 08/31/2006   Qualifier: Diagnosis of  By: Dorathy Daft MD, Marjory Lies    . CHRONIC KIDNEY DISEASE STAGE II (M[place]) 09/14/2009   Annotation: eGFR 64 Qualifier: Diagnosis of  By: Jess Barters MD, [name]    . Chronic lower back pain   . COPD (chronic obstructive pulmonary  disease) ([place]) 09/23/2010   Diagnosed at [facility] in [facility] (Dr. Annamaria Boots)   . DM (diabetes mellitus) type II controlled with renal manifestation ([place]) 08/31/2006   Qualifier: Diagnosis of  By: Dorathy Daft MD, Marjory Lies    . Dyspnea    "all my life; since 6th grade" (07/12/2017)  . HYPERCHOLESTEROLEMIA 08/31/2006   Intolerance to Lipitor OK on Crestor but medicaid no longer covering    . HYPERTENSION, BENIGN SYSTEMIC 08/31/2006   Qualifier: Diagnosis of  By: Dorathy Daft MD, Marjory Lies    . HYPOTHYROIDISM, UNSPECIFIED 08/31/2006   Qualifier: Diagnosis of  By: Dorathy Daft MD, Marjory Lies    . Leg swelling 03/14/2018  . Pneumonia    "3 times" (07/12/2017)  . Scoliosis     Family History  Problem Relation Age of Onset  . Breast cancer Sister  Past Surgical History:  Procedure Laterality Date  . CESAREAN SECTION    . FOOT FRACTURE SURGERY Right    "steel plate in it"  . FOOT SURGERY     " born w/dislocated foot"  . FRACTURE SURGERY    . KNEE ARTHROSCOPY Right   . TOE SURGERY Bilateral    "both pinky toes"  . TONSILLECTOMY AND ADENOIDECTOMY     Social History   Occupational History  . Not on file  Tobacco Use  . Smoking status: Current Every Day Smoker    Packs/day: 0.50    Years: 45.00    Pack years: 22.50    Types: Cigarettes  . Smokeless tobacco: Never Used  Substance and Sexual Activity  . Alcohol use: Yes    Comment: 07/12/2017 "stopped 2 yr ago; did have a drink over the holidays recently"  . Drug use: No  . Sexual activity: Yes

## 2019-01-30 ENCOUNTER — Other Ambulatory Visit: Payer: Self-pay

## 2019-01-30 MED ORDER — ALBUTEROL SULFATE HFA 108 (90 BASE) MCG/ACT IN AERS: INHALATION_SPRAY | RESPIRATORY_TRACT | 0 refills | Status: DC

## 2019-02-01 ENCOUNTER — Encounter: Payer: Self-pay | Admitting: Family Medicine

## 2019-02-01 ENCOUNTER — Other Ambulatory Visit: Payer: Self-pay

## 2019-02-01 ENCOUNTER — Telehealth (INDEPENDENT_AMBULATORY_CARE_PROVIDER_SITE_OTHER): Payer: Medicare Other | Admitting: Family Medicine

## 2019-02-01 DIAGNOSIS — M26609 Unspecified temporomandibular joint disorder, unspecified side: Secondary | ICD-10-CM | POA: Diagnosis not present

## 2019-02-01 DIAGNOSIS — J440 Chronic obstructive pulmonary disease with acute lower respiratory infection: Secondary | ICD-10-CM

## 2019-02-01 DIAGNOSIS — M26659 Arthropathy of unspecified temporomandibular joint: Secondary | ICD-10-CM

## 2019-02-01 MED ORDER — AMOXICILLIN 875 MG PO TABS: 875.0000 mg | ORAL_TABLET | Freq: Two times a day (BID) | ORAL | 0 refills | Status: AC

## 2019-02-01 MED ORDER — TRAMADOL HCL 50 MG PO TABS: 100.0000 mg | ORAL_TABLET | Freq: Two times a day (BID) | ORAL | 0 refills | Status: DC | PRN
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
## 2019-02-02 NOTE — Assessment & Plan Note (Signed)
This is the most likely cause of patient's ear pain due to its chronic nature.  However, cannot exclude otitis media since I cannot examine the patient today.  Will give a course of oxacillin to treat both her bronchitis if it is bacterial and a possible otitis media.  If her ear pain does not improve with this intervention, will need to reassess in person.

## 2019-02-02 NOTE — Assessment & Plan Note (Signed)
Since patient reports that her symptoms have not improved over the last month, we will try a course of amoxicillin 875 mg twice daily for 7 days.  This would also cover an ear infection if this is the cause of her ear pain.

## 2019-02-07 ENCOUNTER — Telehealth: Payer: Self-pay | Admitting: Family Medicine

## 2019-02-07 ENCOUNTER — Telehealth: Payer: Self-pay

## 2019-02-07 NOTE — Telephone Encounter (Signed)
Pt is calling and wanted to let Dr. Shan Levans know that the new tramadol is making her feel light headed and have an upset stomach. She is not taking them anymore and would like to know if she can have her Hydrocodone filled instead.

## 2019-02-07 NOTE — Telephone Encounter (Signed)
Approval information noted today due to an issue with [employer].  Approved for Monovisc, bilateral knee. [place] deductible met Patient will be responsible for 20% OOP. No Co-pay No PA required  Patient had appt.on 01/29/2019 with Dr. Sharol Given

## 2019-02-08 NOTE — Telephone Encounter (Signed)
VM left letting patient know that I will not prescribe hydrocodone since, as we discussed before, the side effects of this medication outweigh its benefits.  I advised her to stop the Tramadol or decrease the dose to 50 mg since it is giving her side effects.  I can refer her to pain management if she requests.

## 2019-02-19 ENCOUNTER — Telehealth: Payer: Self-pay | Admitting: Orthopedic Surgery

## 2019-02-19 ENCOUNTER — Encounter: Payer: Self-pay | Admitting: Family Medicine

## 2019-02-19 NOTE — Telephone Encounter (Signed)
Patient called needing Rx refilled (Hydrocodone) Patient asked if she can get the cortisone injection in her arm wheb she come to her appointment? Patient said she is wearing a sling. The number to contact is [phone]

## 2019-02-19 NOTE — Telephone Encounter (Signed)
We have not written for vicodin through this office, we can inject her shoulder at follow up

## 2019-02-19 NOTE — Telephone Encounter (Signed)
Dr Duda please advise, thank you. 

## 2019-02-19 NOTE — Telephone Encounter (Signed)
Patient called and stated that she needs something for pain to hold her over until her appt last week. She has suffered for 3 years with this pain and tired and crying and in constant pain.  Please call patient as I read the reply to her, She said Dr. Sharol Given gave her a cortisone and gel on the same day.  [facility] Parents advise patient @ [phone] upset

## 2019-02-19 NOTE — Telephone Encounter (Signed)
I called pt and she was very upset that she could not get pain medication. States that she does not understand why we want her to be in pain. I advised that this is not the case advised of Dr. Jess Barters message and said that we are happy to see her and discuss at her appt and the pt disconnected the call.

## 2019-02-20 NOTE — Telephone Encounter (Signed)
Pt has been called and advised no rx for pian medication

## 2019-02-21 DIAGNOSIS — R002 Palpitations: Secondary | ICD-10-CM | POA: Diagnosis not present

## 2019-02-21 DIAGNOSIS — J4520 Mild intermittent asthma, uncomplicated: Secondary | ICD-10-CM | POA: Diagnosis not present

## 2019-02-21 DIAGNOSIS — R0602 Shortness of breath: Secondary | ICD-10-CM | POA: Diagnosis not present

## 2019-02-21 DIAGNOSIS — R072 Precordial pain: Secondary | ICD-10-CM | POA: Diagnosis not present

## 2019-02-24 ENCOUNTER — Other Ambulatory Visit: Payer: Self-pay | Admitting: Family Medicine

## 2019-02-25 ENCOUNTER — Telehealth: Payer: Self-pay | Admitting: *Deleted

## 2019-02-25 NOTE — Telephone Encounter (Signed)
Pt calls for several reason:  1.  She feels like her thyroid "is taking her breath" since decreasing the dose.  Advised that she would need an appt for that.  2. She called her cardiologist and he told her to stop the amoxicillin and the tramadol because it was causing diarrhea and her hear to race ( states this happened last time she was given tramadol).  He also put her back on 25mg  metoprolol.  3. She has an appt with ortho tomorrow. Christen Bame, CMA

## 2019-02-26 ENCOUNTER — Ambulatory Visit (INDEPENDENT_AMBULATORY_CARE_PROVIDER_SITE_OTHER): Payer: Medicare Other | Admitting: Orthopedic Surgery

## 2019-02-26 ENCOUNTER — Encounter: Payer: Self-pay | Admitting: Orthopedic Surgery

## 2019-02-26 ENCOUNTER — Ambulatory Visit (INDEPENDENT_AMBULATORY_CARE_PROVIDER_SITE_OTHER): Payer: Medicare Other

## 2019-02-26 ENCOUNTER — Ambulatory Visit: Payer: Self-pay

## 2019-02-26 DIAGNOSIS — G8929 Other chronic pain: Secondary | ICD-10-CM

## 2019-02-26 DIAGNOSIS — M79671 Pain in right foot: Secondary | ICD-10-CM

## 2019-02-26 DIAGNOSIS — M542 Cervicalgia: Secondary | ICD-10-CM

## 2019-02-26 DIAGNOSIS — M25562 Pain in left knee: Secondary | ICD-10-CM

## 2019-02-26 MED ORDER — HYDROCODONE-ACETAMINOPHEN 5-325 MG PO TABS: 1.0000 | ORAL_TABLET | Freq: Four times a day (QID) | ORAL | 0 refills | Status: DC | PRN

## 2019-02-26 NOTE — Telephone Encounter (Signed)
Thank you for the update, Janett Billow.

## 2019-02-26 NOTE — Progress Notes (Signed)
Office Visit Note   Patient: [name] Long           Date of Birth: Dec 26, 1950           MRN: [id number] Visit Date: 02/26/2019              Requested by: Kathrene Alu, MD [address],  [place] [address] PCP: Kathrene Alu, MD  Chief Complaint  Patient presents with  . Left Knee - Follow-up  . Neck - Pain  . Right Foot - Pain      HPI: Patient is a 68 year old woman who presents complaining of neck pain weakness in her left upper extremity left knee pain status post hyaluronic acid injection last month as well as right foot pain.  Patient complains of burning in the right foot she states she is status post surgery with Dr. Carloyn Manner.  Patient states that she is concerned because her primary care physician PA provided her a prescription for tramadol for pain and she states this caused her 5 days of diarrhea.  Assessment & Plan: Visit Diagnoses:  1. Pain in right foot   2. Chronic pain of left knee   3. Neck pain     Plan: Recommended that patient take the prednisone prescription she was provided previously and take 10 mg with breakfast and we will reevaluate in 2 weeks to see how she is doing.  Discussed that this may increase her blood sugars.  Recommended that she not take the tramadol and she was given a refill prescription for her hydrocodone.  Follow-Up Instructions: Return in about 2 weeks (around 03/12/2019).   Ortho Exam  Patient is alert, oriented, no adenopathy, well-dressed, normal affect, normal respiratory effort. Examination patient ambulates with a cane.  She has decreased range of motion of her cervical spine she has global weakness subjectively in the left upper extremity she lifts her arm up to show how weak it is.  No focal motor weakness in the left upper extremity.  Examination she has tenderness to palpation around the left knee with the tricompartmental arthritis.  Right foot has burning pain but no focal symptoms radiograph shows no  evidence of a fracture.  Patient has no swelling of the MCP joints no ulnar deviations of her fingers.  No history of rheumatoid arthritis.  Imaging: Xr Knee 1-2 Views Left  Result Date: 02/26/2019 2 view radiographs of the left knee shows some mild subluxation bone-on-bone contact of the medial joint line with subchondral sclerosis and periarticular bony spurs including the patellofemoral joint.  Xr Foot Complete Right  Result Date: 02/26/2019 Three-view radiographs of the right foot shows previous surgery at the MTP joint of the right little toe no Charcot collapse no acute fractures patient has a cavus foot.  No images are attached to the encounter.  Labs: Lab Results  Component Value Date   HGBA1C 8.8 (A) 01/11/2019   HGBA1C 7.1 (A) 07/13/2018   HGBA1C 8.3 (A) 04/16/2018   LABORGA NO GROWTH 12/04/2014     Lab Results  Component Value Date   ALBUMIN 3.4 (L) 09/08/2018   ALBUMIN 3.3 (L) 12/06/2017   ALBUMIN 2.8 (L) 07/10/2017    Lab Results  Component Value Date   MG 1.7 12/25/2016   MG 1.8 06/10/2016   MG 1.8 02/02/2016   Lab Results  Component Value Date   VD25OH 43.0 02/23/2018   VD25OH 30 05/06/2015   VD25OH 12 (L) 12/18/2014    No results found  for: PREALBUMIN CBC EXTENDED Latest Ref Rng & Units 09/08/2018 12/06/2017 12/06/2017  WBC 4.0 - 10.5 K/uL 7.1 - 6.2  RBC 3.87 - 5.11 MIL/uL 3.90 - 3.70(L)  HGB 12.0 - 15.0 g/dL 13.4 14.3 13.0  HCT 36.0 - 46.0 % 38.8 42.0 38.1  PLT 150 - 400 K/uL 191 - 178  NEUTROABS 1.7 - 7.7 K/uL - - 2.2  LYMPHSABS 0.7 - 4.0 K/uL - - 3.2     There is no height or weight on file to calculate BMI.  Orders:  Orders Placed This Encounter  Procedures  . XR Foot Complete Right  . XR Knee 1-2 Views Left  . XR Cervical Spine 2 or 3 views   Meds ordered this encounter  Medications  . HYDROcodone-acetaminophen (NORCO/VICODIN) 5-325 MG tablet    Sig: Take 1 tablet by mouth every 6 (six) hours as needed for moderate pain.    Dispense:   28 tablet    Refill:  0     Procedures: No procedures performed  Clinical Data: No additional findings.  ROS:  All other systems negative, except as noted in the HPI. Review of Systems  Objective: Vital Signs: There were no vitals taken for this visit.  Specialty Comments:  No specialty comments available.  PMFS History: Patient Active Problem List   Diagnosis Date Noted  . 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 Other chronic pain 12/03/2018  . Dizziness of unknown cause 08/04/2018  . Tremor 07/15/2018  . Atrial flutter with rapid ventricular response ([place]) 07/10/2017  . Atrial fibrillation ([place]) 07/05/2017  . COPD (chronic obstructive pulmonary disease) ([place]) 01/22/2017  . Pedal edema   . Acute on chronic congestive heart failure ([place])   . Chronic bilateral low back pain without sciatica 08/15/2016  . 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 Schizoaffective disorder, bipolar type ([place])   . Osteoarthritis, multiple sites 06/08/2011  . CHRONIC KIDNEY DISEASE STAGE II (MILD) 09/14/2009  . Hypothyroidism 08/31/2006  . Type 2 diabetes mellitus ([place]) 08/31/2006  . HYPERCHOLESTEROLEMIA 08/31/2006  . Tobacco abuse 08/31/2006  . HYPERTENSION, BENIGN SYSTEMIC 08/31/2006   Past Medical History:  Diagnosis Date  . Arthritis    "real bad; all over" (07/12/2017)  . Asthma   . BIPOLAR DISORDER 08/31/2006   Qualifier: Diagnosis of  By: Dorathy Daft MD, Marjory Lies    . CHRONIC KIDNEY DISEASE STAGE II (MILD) 09/14/2009   Annotation: eGFR 64 Qualifier: Diagnosis of  By: Jess Barters MD, [name]    . Chronic lower back pain   . COPD (chronic obstructive pulmonary disease) ([place]) 09/23/2010   Diagnosed at [facility] in 2008 (Dr. Annamaria Boots)   . DM (diabetes mellitus) type II controlled with renal manifestation ([place]) 08/31/2006   Qualifier: Diagnosis of  By: Dorathy Daft MD, Marjory Lies    . Dyspnea    "all my life; since 6th grade" (07/12/2017)  . HYPERCHOLESTEROLEMIA  08/31/2006   Intolerance to Lipitor OK on Crestor but medicaid no longer covering    . HYPERTENSION, BENIGN SYSTEMIC 08/31/2006   Qualifier: Diagnosis of  By: Dorathy Daft MD, Marjory Lies    . HYPOTHYROIDISM, UNSPECIFIED 08/31/2006   Qualifier: Diagnosis of  By: Dorathy Daft MD, Marjory Lies    . Leg swelling 03/14/2018  . Pneumonia    "3 times" (07/12/2017)  . Scoliosis     Family History  Problem Relation Age of Onset  . Breast cancer Sister     Past Surgical History:  Procedure Laterality Date  . CESAREAN SECTION    .  FOOT FRACTURE SURGERY Right    "steel plate in it"  . FOOT SURGERY     " born w/dislocated foot"  . FRACTURE SURGERY    . KNEE ARTHROSCOPY Right   . TOE SURGERY Bilateral    "both pinky toes"  . TONSILLECTOMY AND ADENOIDECTOMY     Social History   Occupational History  . Not on file  Tobacco Use  . Smoking status: Current Every Day Smoker    Packs/day: 0.50    Years: 45.00    Pack years: 22.50    Types: Cigarettes  . Smokeless tobacco: Never Used  Substance and Sexual Activity  . Alcohol use: Yes    Comment: 07/12/2017 "stopped 2 yr ago; did have a drink over the holidays recently"  . Drug use: No  . Sexual activity: Yes

## 2019-03-12 ENCOUNTER — Ambulatory Visit: Payer: Medicare Other | Admitting: Orthopedic Surgery

## 2019-03-13 ENCOUNTER — Telehealth: Payer: Self-pay | Admitting: Orthopedic Surgery

## 2019-03-13 NOTE — Telephone Encounter (Signed)
Patient called requesting a RX on her Hydrocodone.  She stated that she is almost out because of having to take 3 a day due to waking up in pain at night.  Patient uses [facility] on [address].  CB#[id number].

## 2019-03-14 NOTE — Telephone Encounter (Signed)
Patient was called and notified via voicemail that Dr Sharol Given is out of the office at this time and will not be back into the office until next week. Dr Sharol Given prescribed last medication. Patient states in previous notes that she will run out. Patient has not had any recent surgeries noted for pain medication at this time. Patient can call the office for any questions or concerns if needed.

## 2019-03-22 ENCOUNTER — Other Ambulatory Visit: Payer: Self-pay | Admitting: Family Medicine

## 2019-03-22 ENCOUNTER — Other Ambulatory Visit (HOSPITAL_COMMUNITY)
Admission: RE | Admit: 2019-03-22 | Discharge: 2019-03-22 | Disposition: A | Discharge status: Home / Self Care | Payer: Medicare Other | Source: Ambulatory Visit | Attending: Family Medicine | Admitting: Family Medicine

## 2019-03-22 ENCOUNTER — Ambulatory Visit (INDEPENDENT_AMBULATORY_CARE_PROVIDER_SITE_OTHER): Payer: Medicare Other | Admitting: Family Medicine

## 2019-03-22 ENCOUNTER — Other Ambulatory Visit: Payer: Self-pay

## 2019-03-22 VITALS — BP 127/80 | HR 80 | Wt 161.2 lb

## 2019-03-22 DIAGNOSIS — Z113 Encounter for screening for infections with a predominantly sexual mode of transmission: Secondary | ICD-10-CM | POA: Diagnosis not present

## 2019-03-22 DIAGNOSIS — R300 Dysuria: Secondary | ICD-10-CM | POA: Diagnosis not present

## 2019-03-22 DIAGNOSIS — I10 Essential (primary) hypertension: Secondary | ICD-10-CM | POA: Diagnosis not present

## 2019-03-22 DIAGNOSIS — N183 Chronic kidney disease, stage 3 unspecified: Secondary | ICD-10-CM

## 2019-03-22 DIAGNOSIS — E7800 Pure hypercholesterolemia, unspecified: Secondary | ICD-10-CM

## 2019-03-22 DIAGNOSIS — E039 Hypothyroidism, unspecified: Secondary | ICD-10-CM

## 2019-03-22 DIAGNOSIS — Z202 Contact with and (suspected) exposure to infections with a predominantly sexual mode of transmission: Secondary | ICD-10-CM | POA: Diagnosis not present

## 2019-03-22 DIAGNOSIS — Z114 Encounter for screening for human immunodeficiency virus [HIV]: Secondary | ICD-10-CM | POA: Diagnosis not present

## 2019-03-22 DIAGNOSIS — E1122 Type 2 diabetes mellitus with diabetic chronic kidney disease: Secondary | ICD-10-CM | POA: Diagnosis not present

## 2019-03-22 LAB — POCT URINALYSIS DIP (MANUAL ENTRY)
Bilirubin, UA: NEGATIVE
Blood, UA: NEGATIVE
Glucose, UA: NEGATIVE mg/dL
Ketones, POC UA: NEGATIVE mg/dL
Leukocytes, UA: NEGATIVE
Nitrite, UA: NEGATIVE
Protein Ur, POC: NEGATIVE mg/dL
Spec Grav, UA: 1.02 (ref 1.010–1.025)
Urobilinogen, UA: 0.2 E.U./dL
pH, UA: 6 (ref 5.0–8.0)

## 2019-03-22 LAB — POCT UA - MICROALBUMIN
Creatinine, POC: 100 mg/dL
Microalbumin Ur, POC: 80 mg/L

## 2019-03-23 LAB — LIPID PANEL
Chol/HDL Ratio: 3.3 ratio (ref 0.0–4.4)
Cholesterol, Total: 154 mg/dL (ref 100–199)
HDL: 47 mg/dL (ref >39)
LDL Chol Calc (NIH): 76 mg/dL (ref 0–99)
Triglycerides: 182 mg/dL — ABNORMAL HIGH (ref 0–149)
VLDL Cholesterol Cal: 31 mg/dL (ref 5–40)

## 2019-03-23 LAB — TSH: TSH: 0.745 u[IU]/mL (ref 0.450–4.500)

## 2019-03-23 LAB — HIV ANTIBODY (ROUTINE TESTING W REFLEX): HIV Screen 4th Generation wRfx: NONREACTIVE

## 2019-03-25 DIAGNOSIS — Z113 Encounter for screening for infections with a predominantly sexual mode of transmission: Secondary | ICD-10-CM | POA: Insufficient documentation

## 2019-03-25 NOTE — Assessment & Plan Note (Signed)
Patient says she occasionally has pain with urination.  Denies any lesions or abnormal discharge.  Says this is "every now and then "  UA negative for UTI

## 2019-03-25 NOTE — Assessment & Plan Note (Signed)
Tolerating rosuvastatin well, triglycerides moderately elevated everything else within normal limits.  No current changes planned

## 2019-03-25 NOTE — Assessment & Plan Note (Signed)
Urine microalbumin showed 30-300.  Will refer to nephrology if numbers come back over 300 on future test.

## 2019-03-25 NOTE — Assessment & Plan Note (Addendum)
Patient is sexually active and would like an HIV test just to be sure.  Results negative

## 2019-03-25 NOTE — Assessment & Plan Note (Signed)
Patient is concerned about getting her thyroid checked because has not been done for a while, TSH within normal limits.

## 2019-03-25 NOTE — Progress Notes (Signed)
Subjective:  [name] Long is a 68 y.o. female who presents to the [facility] today with a chief complaint of routine checkup.   HPI: Patient with multiple requests for labs and to "check everything "only real specific complaint is occasional dysuria which he is not having this time.  Screen for STD (sexually transmitted disease) Patient is sexually active and would like an HIV test just to be sure.  Dysuria Patient says she occasionally has pain with urination.  Denies any lesions or abnormal discharge.  Says this is "every now and then "  HYPERCHOLESTEROLEMIA Tolerating rosuvastatin well, triglycerides moderately elevated everything else within normal limits.  No current changes planned  Type 2 diabetes mellitus (HCC) Urine microalbumin showed 30-300.  Will refer to nephrology if numbers come back over 300 on future test.  Hypothyroidism Patient is concerned about getting her thyroid checked because has not been done for a while, TSH within normal limits.  Objective:  Physical Exam: BP 127/80   Pulse 80   Wt 161 lb 3.2 oz (73.1 kg)   SpO2 99%   BMI 26.83 kg/m   Gen: NAD, conversing comfortably CV: RRR with no murmurs appreciated Pulm: NWOB, CTAB with no crackles, wheezes, or rhonchi GI: Normal bowel sounds present. Soft, Nontender, Nondistended. MSK: no edema, cyanosis, or clubbing noted Skin: warm, dry Neuro: grossly normal, moves all extremities Psych: Normal affect and thought content  Results for orders placed or performed in visit on 03/22/19 (from the past 72 hour(s))  POCT urinalysis dipstick     Status: None   Collection Time: 03/22/19  3:40 PM  Result Value Ref Range   Color, UA yellow yellow   Clarity, UA clear clear   Glucose, UA negative negative mg/dL   Bilirubin, UA negative negative   Ketones, POC UA negative negative mg/dL   Spec Grav, UA 1.020 1.010 - 1.025   Blood, UA negative negative   pH, UA 6.0 5.0 - 8.0   Protein Ur, POC negative negative  mg/dL   Urobilinogen, UA 0.2 0.2 or 1.0 E.U./dL   Nitrite, UA Negative Negative   Leukocytes, UA Negative Negative  POCT UA - Microalbumin     Status: Abnormal   Collection Time: 03/22/19  3:40 PM  Result Value Ref Range   Microalbumin Ur, POC 80 mg/L   Creatinine, POC 100 mg/dL   Albumin/Creatinine Ratio, Urine, POC 30-300   Lipid Panel     Status: Abnormal   Collection Time: 03/22/19  4:58 PM  Result Value Ref Range   Cholesterol, Total 154 100 - 199 mg/dL   Triglycerides 182 (H) 0 - 149 mg/dL   HDL 47 >39 mg/dL   VLDL Cholesterol Cal 31 5 - 40 mg/dL   LDL Chol Calc (NIH) 76 0 - 99 mg/dL   Chol/HDL Ratio 3.3 0.0 - 4.4 ratio    Comment:                                   T. Chol/HDL Ratio                                             Men  Women  1/2 Avg.Risk  3.4    3.3                                   Avg.Risk  5.0    4.4                                2X Avg.Risk  9.6    7.1                                3X Avg.Risk 23.4   11.0   HIV Antibody (routine testing w rflx)     Status: None   Collection Time: 03/22/19  4:58 PM  Result Value Ref Range   HIV Screen 4th Generation wRfx Non Reactive Non Reactive  TSH     Status: None   Collection Time: 03/22/19  4:58 PM  Result Value Ref Range   TSH 0.745 0.450 - 4.500 uIU/mL     Assessment/Plan:  Screen for STD (sexually transmitted disease) Patient is sexually active and would like an HIV test just to be sure.  Results negative  Dysuria Patient says she occasionally has pain with urination.  Denies any lesions or abnormal discharge.  Says this is "every now and then "  UA negative for UTI  HYPERCHOLESTEROLEMIA Tolerating rosuvastatin well, triglycerides moderately elevated everything else within normal limits.  No current changes planned  Type 2 diabetes mellitus (HCC) Urine microalbumin showed 30-300.  Will refer to nephrology if numbers come back over 300 on future test.  Hypothyroidism  Patient is concerned about getting her thyroid checked because has not been done for a while, TSH within normal limits.   Sherene Sires, DO FAMILY MEDICINE RESIDENT - PGY3 03/25/2019 8:23 AM

## 2019-03-26 LAB — CERVICOVAGINAL ANCILLARY ONLY
Chlamydia: NEGATIVE
Neisseria Gonorrhea: NEGATIVE

## 2019-03-27 ENCOUNTER — Telehealth: Payer: Self-pay | Admitting: Family Medicine

## 2019-03-27 ENCOUNTER — Encounter: Payer: Self-pay | Admitting: Family Medicine

## 2019-03-27 NOTE — Telephone Encounter (Signed)
Call patient back and left HIPAA compliant message to return phone call to the clinic to get the lab results because they did not pick up.  Dr. Criss Rosales

## 2019-03-27 NOTE — Telephone Encounter (Signed)
Was able to reach patient on phone, confirmed identity using 2 factors and gave her her results.  She will see Dr. Shan Levans in November 2 follow-up on diabetes Dr. Criss Rosales

## 2019-03-27 NOTE — Telephone Encounter (Signed)
Patient missed call from Dr. Criss Rosales and would like [place] to try her again with her results please.  You can still mail the letter as well, thanks.

## 2019-03-27 NOTE — Progress Notes (Signed)
Patient has been called but did not pick up, will send letter to try notify her of results.  Dr. Criss Rosales

## 2019-03-27 NOTE — Telephone Encounter (Signed)
Patient needs results from her labwork today if possible, thanks.  Best number is # [phone].

## 2019-03-27 NOTE — Telephone Encounter (Signed)
Routing to doctor that ordered labs. April Zimmerman Rumple, CMA

## 2019-03-29 ENCOUNTER — Telehealth: Payer: Self-pay | Admitting: Orthopedic Surgery

## 2019-03-29 NOTE — Telephone Encounter (Signed)
Patient called. Says her apartment is on the 4th floor. The shots are not helping. She is requesting a 1st floor apartment. Someone is faxing over a note for Dr. Sharol Given to sign for approval for the apartment. Her call back number is [phone]

## 2019-04-01 ENCOUNTER — Ambulatory Visit (INDEPENDENT_AMBULATORY_CARE_PROVIDER_SITE_OTHER): Payer: Medicare Other | Admitting: Orthopedic Surgery

## 2019-04-01 ENCOUNTER — Encounter: Payer: Self-pay | Admitting: Orthopedic Surgery

## 2019-04-01 ENCOUNTER — Other Ambulatory Visit: Payer: Self-pay

## 2019-04-01 VITALS — Ht 65.0 in | Wt 161.0 lb

## 2019-04-01 DIAGNOSIS — M1712 Unilateral primary osteoarthritis, left knee: Secondary | ICD-10-CM

## 2019-04-01 DIAGNOSIS — I872 Venous insufficiency (chronic) (peripheral): Secondary | ICD-10-CM

## 2019-04-01 MED ORDER — HYDROCODONE-ACETAMINOPHEN 5-325 MG PO TABS: 1.0000 | ORAL_TABLET | Freq: Four times a day (QID) | ORAL | 0 refills | Status: DC | PRN

## 2019-04-01 NOTE — Telephone Encounter (Signed)
Pt has an appt today will discuss at that time.

## 2019-04-01 NOTE — Progress Notes (Signed)
Office Visit Note   Patient: [name] Long           Date of Birth: February 23, 1951           MRN: [id number] Visit Date: 04/01/2019              Requested by: Kathrene Alu, MD [address],  [place] [address] PCP: Kathrene Alu, MD  Chief Complaint  Patient presents with  . Left Leg - Pain  . Right Leg - Pain      HPI: Patient is a 68 year old woman who presents complaining of pain in both knees left worse than right.  She states she also has swelling in both legs left worse than right.  Patient states she currently lives on a fourth floor apartment and cannot go up 4 flights of stairs at home.  She states she needs to live on a ground floor apartment.  Patient states she has neuropathy she request orthopedic shoes.  Patient states that her father and ex-husband just passed away she states she still has chronic lower back pain.  Assessment & Plan: Visit Diagnoses:  1. Venous insufficiency (chronic) (peripheral)   2. Unilateral primary osteoarthritis, left knee     Plan: Recommended exercise compression and elevation for the swelling in her legs discussed that this should also help her back.  Recommended that she lives on a ground-level floor apartment we will complete paperwork when we receive this.  She is given a prescription for Vicodin discussed that we will not be able to fill this as an ongoing chronic medication.  Follow-Up Instructions: Return if symptoms worsen or fail to improve.   Ortho Exam  Patient is alert, oriented, no adenopathy, well-dressed, normal affect, normal respiratory effort. Examination patient ambulates with a cane.  She has a slight antalgic gait she is venous stasis swelling in both lower extremities left worse than right.  She has a negative straight leg raise bilaterally no focal motor weakness.  Patient is currently wearing moccasins.  Imaging: No results found. No images are attached to the encounter.  Labs: Lab  Results  Component Value Date   HGBA1C 8.8 (A) 01/11/2019   HGBA1C 7.1 (A) 07/13/2018   HGBA1C 8.3 (A) 04/16/2018   LABORGA NO GROWTH 12/04/2014     Lab Results  Component Value Date   ALBUMIN 3.4 (L) 09/08/2018   ALBUMIN 3.3 (L) 12/06/2017   ALBUMIN 2.8 (L) 07/10/2017    Lab Results  Component Value Date   MG 1.7 12/25/2016   MG 1.8 06/10/2016   MG 1.8 02/02/2016   Lab Results  Component Value Date   VD25OH 43.0 02/23/2018   VD25OH 30 05/06/2015   VD25OH 12 (L) 12/18/2014    No results found for: PREALBUMIN CBC EXTENDED Latest Ref Rng & Units 09/08/2018 12/06/2017 12/06/2017  WBC 4.0 - 10.5 K/uL 7.1 - 6.2  RBC 3.87 - 5.11 MIL/uL 3.90 - 3.70(L)  HGB 12.0 - 15.0 g/dL 13.4 14.3 13.0  HCT 36.0 - 46.0 % 38.8 42.0 38.1  PLT 150 - 400 K/uL 191 - 178  NEUTROABS 1.7 - 7.7 K/uL - - 2.2  LYMPHSABS 0.7 - 4.0 K/uL - - 3.2     Body mass index is 26.79 kg/m.  Orders:  No orders of the defined types were placed in this encounter.  Meds ordered this encounter  Medications  . HYDROcodone-acetaminophen (NORCO/VICODIN) 5-325 MG tablet    Sig: Take 1 tablet by mouth every 6 (six)  hours as needed for moderate pain.    Dispense:  28 tablet    Refill:  0     Procedures: No procedures performed  Clinical Data: No additional findings.  ROS:  All other systems negative, except as noted in the HPI. Review of Systems  Objective: Vital Signs: Ht '5\' 5"'  (1.651 m)   Wt 161 lb (73 kg)   BMI 26.79 kg/m   Specialty Comments:  No specialty comments available.  PMFS History: Patient Active Problem List   Diagnosis Date Noted  . Screen for STD (sexually transmitted disease) 03/25/2019  . 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 Other chronic pain 12/03/2018  . Dizziness of unknown cause 08/04/2018  . Tremor 07/15/2018  . Atrial flutter with rapid ventricular response ([place]) 07/10/2017  . Atrial fibrillation ([place]) 07/05/2017  . COPD (chronic obstructive pulmonary disease) ([place]) 01/22/2017   . Pedal edema   . Acute on chronic congestive heart failure ([place])   . Chronic bilateral low back pain without sciatica 08/15/2016  . 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 Schizoaffective disorder, bipolar type ([place])   . Osteoarthritis, multiple sites 06/08/2011  . Dysuria 02/14/2011  . CHRONIC KIDNEY DISEASE STAGE II (MILD) 09/14/2009  . Hypothyroidism 08/31/2006  . Type 2 diabetes mellitus ([place]) 08/31/2006  . HYPERCHOLESTEROLEMIA 08/31/2006  . Tobacco abuse 08/31/2006  . HYPERTENSION, BENIGN SYSTEMIC 08/31/2006   Past Medical History:  Diagnosis Date  . Arthritis    "real bad; all over" (07/12/2017)  . Asthma   . BIPOLAR DISORDER 08/31/2006   Qualifier: Diagnosis of  By: Dorathy Daft MD, Marjory Lies    . CHRONIC KIDNEY DISEASE STAGE II (MILD) 09/14/2009   Annotation: eGFR 64 Qualifier: Diagnosis of  By: Jess Barters MD, [name]    . Chronic lower back pain   . COPD (chronic obstructive pulmonary disease) ([place]) 09/23/2010   Diagnosed at [facility] in 2008 (Dr. Annamaria Boots)   . DM (diabetes mellitus) type II controlled with renal manifestation ([place]) 08/31/2006   Qualifier: Diagnosis of  By: Dorathy Daft MD, Marjory Lies    . Dyspnea    "all my life; since 6th grade" (07/12/2017)  . HYPERCHOLESTEROLEMIA 08/31/2006   Intolerance to Lipitor OK on Crestor but medicaid no longer covering    . HYPERTENSION, BENIGN SYSTEMIC 08/31/2006   Qualifier: Diagnosis of  By: Dorathy Daft MD, Marjory Lies    . HYPOTHYROIDISM, UNSPECIFIED 08/31/2006   Qualifier: Diagnosis of  By: Dorathy Daft MD, Marjory Lies    . Leg swelling 03/14/2018  . Pneumonia    "3 times" (07/12/2017)  . Scoliosis     Family History  Problem Relation Age of Onset  . Breast cancer Sister     Past Surgical History:  Procedure Laterality Date  . CESAREAN SECTION    . FOOT FRACTURE SURGERY Right    "steel plate in it"  . FOOT SURGERY     " born w/dislocated foot"  . FRACTURE SURGERY    . KNEE ARTHROSCOPY Right   . TOE  SURGERY Bilateral    "both pinky toes"  . TONSILLECTOMY AND ADENOIDECTOMY     Social History   Occupational History  . Not on file  Tobacco Use  . Smoking status: Current Every Day Smoker    Packs/day: 0.50    Years: 45.00    Pack years: 22.50    Types: Cigarettes  . Smokeless tobacco: Never Used  Substance and Sexual Activity  . Alcohol use: Yes    Comment: 07/12/2017 "stopped 2 yr ago;  did have a drink over the holidays recently"  . Drug use: No  . Sexual activity: Yes

## 2019-04-04 ENCOUNTER — Ambulatory Visit: Payer: Medicare Other

## 2019-04-11 ENCOUNTER — Ambulatory Visit (INDEPENDENT_AMBULATORY_CARE_PROVIDER_SITE_OTHER): Payer: Medicare Other | Admitting: *Deleted

## 2019-04-11 ENCOUNTER — Other Ambulatory Visit: Payer: Self-pay

## 2019-04-11 DIAGNOSIS — Z23 Encounter for immunization: Secondary | ICD-10-CM | POA: Diagnosis not present

## 2019-04-11 NOTE — Progress Notes (Signed)
Pt tolerated vaccine well. Deseree Blount, CMA  

## 2019-04-12 DIAGNOSIS — H0100B Unspecified blepharitis left eye, upper and lower eyelids: Secondary | ICD-10-CM | POA: Diagnosis not present

## 2019-04-12 DIAGNOSIS — H0100A Unspecified blepharitis right eye, upper and lower eyelids: Secondary | ICD-10-CM | POA: Diagnosis not present

## 2019-04-12 DIAGNOSIS — H04123 Dry eye syndrome of bilateral lacrimal glands: Secondary | ICD-10-CM | POA: Diagnosis not present

## 2019-04-13 ENCOUNTER — Other Ambulatory Visit: Payer: Self-pay | Admitting: Family Medicine

## 2019-04-15 ENCOUNTER — Other Ambulatory Visit: Payer: Self-pay | Admitting: Family Medicine

## 2019-04-18 ENCOUNTER — Telehealth: Payer: Self-pay | Admitting: Orthopedic Surgery

## 2019-04-18 NOTE — Telephone Encounter (Signed)
April please advise, thank you.

## 2019-04-18 NOTE — Telephone Encounter (Signed)
Pt called in would like a monovisc injection for her right knee to be submitted to her insurance.                 [phone]

## 2019-04-22 NOTE — Telephone Encounter (Signed)
Please dis regard last message.  Patient is okay to have Monovisc, right knee injection.  On 01/29/2019 patient received Monovisc injection for left knee only.  Please advise patient. Thank you.

## 2019-04-22 NOTE — Telephone Encounter (Signed)
Noted.  Will submit.  

## 2019-04-23 ENCOUNTER — Telehealth: Payer: Self-pay

## 2019-04-23 NOTE — Telephone Encounter (Signed)
Patient previously approved for Monovisc, bilateral knee. Please see approval message under 02/07/2019.  Patient only had left knee injected with gel injection.

## 2019-04-23 NOTE — Telephone Encounter (Signed)
Patient was called to get scheduled for Monovisc injection, lvm to return call.

## 2019-04-30 DIAGNOSIS — R0602 Shortness of breath: Secondary | ICD-10-CM | POA: Diagnosis not present

## 2019-04-30 DIAGNOSIS — J4520 Mild intermittent asthma, uncomplicated: Secondary | ICD-10-CM | POA: Diagnosis not present

## 2019-04-30 DIAGNOSIS — I10 Essential (primary) hypertension: Secondary | ICD-10-CM | POA: Diagnosis not present

## 2019-04-30 DIAGNOSIS — J208 Acute bronchitis due to other specified organisms: Secondary | ICD-10-CM | POA: Diagnosis not present

## 2019-05-01 ENCOUNTER — Telehealth: Payer: Self-pay | Admitting: Family Medicine

## 2019-05-01 NOTE — Telephone Encounter (Signed)
Pt called because her pharmacy is stating that she needs a code or authorization on her strips before they can refill them. Can we call and see what code or information they are needing? Jw

## 2019-05-01 NOTE — Telephone Encounter (Signed)
Spoke to pharmacy.  This has been taken care of (Needed an authorized provider). Ottis Stain, CMA

## 2019-05-13 ENCOUNTER — Ambulatory Visit (INDEPENDENT_AMBULATORY_CARE_PROVIDER_SITE_OTHER): Payer: Medicare Other | Admitting: Orthopedic Surgery

## 2019-05-13 ENCOUNTER — Other Ambulatory Visit: Payer: Self-pay

## 2019-05-13 ENCOUNTER — Encounter: Payer: Self-pay | Admitting: Orthopedic Surgery

## 2019-05-13 VITALS — Ht 65.0 in | Wt 161.0 lb

## 2019-05-13 DIAGNOSIS — M7542 Impingement syndrome of left shoulder: Secondary | ICD-10-CM

## 2019-05-13 DIAGNOSIS — I872 Venous insufficiency (chronic) (peripheral): Secondary | ICD-10-CM | POA: Diagnosis not present

## 2019-05-13 DIAGNOSIS — M1711 Unilateral primary osteoarthritis, right knee: Secondary | ICD-10-CM

## 2019-05-13 MED ORDER — METHYLPREDNISOLONE ACETATE 40 MG/ML IJ SUSP
40.0000 mg | INTRAMUSCULAR | Status: AC | PRN
  Administered 2019-05-13: 40 mg via INTRA_ARTICULAR

## 2019-05-13 MED ORDER — HYALURONAN 88 MG/4ML IX SOSY
88.0000 mg | PREFILLED_SYRINGE | INTRA_ARTICULAR | Status: AC | PRN
  Administered 2019-05-13: 88 mg via INTRA_ARTICULAR

## 2019-05-13 MED ORDER — HYDROCODONE-ACETAMINOPHEN 5-325 MG PO TABS: 1.0000 | ORAL_TABLET | Freq: Four times a day (QID) | ORAL | 0 refills | Status: DC | PRN

## 2019-05-13 MED ORDER — LIDOCAINE HCL 1 % IJ SOLN
5.0000 mL | INTRAMUSCULAR | Status: AC | PRN
  Administered 2019-05-13: 5 mL

## 2019-05-13 NOTE — Progress Notes (Signed)
Office Visit Note   Patient: [name] Long           Date of Birth: Nov 19, 1950           MRN: [id number] Visit Date: 05/13/2019              Requested by: Kathrene Alu, MD [address],  [place] [address] PCP: Kathrene Alu, MD  Chief Complaint  Patient presents with  . Right Knee - Pain    Buy and bill Monovisc injection      HPI: Patient is a 68 year old woman who presents in follow-up for osteoarthritis of her right knee as well as impingement symptoms of her left shoulder.  She states injections have helped well in the past for the shoulder and she presents for a Monovisc injection for the right knee.  Assessment & Plan: Visit Diagnoses: No diagnosis found.  Plan: Left shoulder was injected in the subacromial space right knee was injected with Monovisc.  We will place an order for repeat hyaluronic acid injection in January.  Follow-Up Instructions: No follow-ups on file.   Ortho Exam  Patient is alert, oriented, no adenopathy, well-dressed, normal affect, normal respiratory effort. Examination patient has abduction and flexion of 120 degrees of the left shoulder.  She has pain with Neer and Hawkins impingement test pain with a drop arm test.  Right knee has no effusion no redness no cellulitis.  Imaging: No results found. No images are attached to the encounter.  Labs: Lab Results  Component Value Date   HGBA1C 8.8 (A) 01/11/2019   HGBA1C 7.1 (A) 07/13/2018   HGBA1C 8.3 (A) 04/16/2018   LABORGA NO GROWTH 12/04/2014     Lab Results  Component Value Date   ALBUMIN 3.4 (L) 09/08/2018   ALBUMIN 3.3 (L) 12/06/2017   ALBUMIN 2.8 (L) 07/10/2017    Lab Results  Component Value Date   MG 1.7 12/25/2016   MG 1.8 06/10/2016   MG 1.8 02/02/2016   Lab Results  Component Value Date   VD25OH 43.0 02/23/2018   VD25OH 30 05/06/2015   VD25OH 12 (L) 12/18/2014    No results found for: PREALBUMIN CBC EXTENDED Latest Ref Rng & Units  09/08/2018 12/06/2017 12/06/2017  WBC 4.0 - 10.5 K/uL 7.1 - 6.2  RBC 3.87 - 5.11 MIL/uL 3.90 - 3.70(L)  HGB 12.0 - 15.0 g/dL 13.4 14.3 13.0  HCT 36.0 - 46.0 % 38.8 42.0 38.1  PLT 150 - 400 K/uL 191 - 178  NEUTROABS 1.7 - 7.7 K/uL - - 2.2  LYMPHSABS 0.7 - 4.0 K/uL - - 3.2     Body mass index is 26.79 kg/m.  Orders:  No orders of the defined types were placed in this encounter.  Meds ordered this encounter  Medications  . HYDROcodone-acetaminophen (NORCO/VICODIN) 5-325 MG tablet    Sig: Take 1 tablet by mouth every 6 (six) hours as needed for moderate pain.    Dispense:  28 tablet    Refill:  0     Procedures: Large Joint Inj: L subacromial bursa on 05/13/2019 1:20 PM Indications: diagnostic evaluation and pain Details: 22 G 1.5 in needle, posterior approach  Arthrogram: No  Medications: 5 mL lidocaine 1 %; 40 mg methylPREDNISolone acetate 40 MG/ML Outcome: tolerated well, no immediate complications Procedure, treatment alternatives, risks and benefits explained, specific risks discussed. Consent was given by the patient. Immediately prior to procedure a time out was called to verify the correct patient, procedure, equipment,  support staff and site/side marked as required. Patient was prepped and draped in the usual sterile fashion.   Large Joint Inj: R knee on 05/13/2019 1:20 PM Indications: pain and diagnostic evaluation Details: 22 G 1.5 in needle, anteromedial approach  Arthrogram: No  Medications: 5 mL lidocaine 1 %; 40 mg methylPREDNISolone acetate 40 MG/ML; 88 mg Hyaluronan 88 MG/4ML Outcome: tolerated well, no immediate complications Procedure, treatment alternatives, risks and benefits explained, specific risks discussed. Consent was given by the patient. Immediately prior to procedure a time out was called to verify the correct patient, procedure, equipment, support staff and site/side marked as required. Patient was prepped and draped in the usual sterile fashion.       Clinical Data: No additional findings.  ROS:  All other systems negative, except as noted in the HPI. Review of Systems  Objective: Vital Signs: Ht '5\' 5"'  (1.651 m)   Wt 161 lb (73 kg)   BMI 26.79 kg/m   Specialty Comments:  No specialty comments available.  PMFS History: Patient Active Problem List   Diagnosis Date Noted  . Screen for STD (sexually transmitted disease) 03/25/2019  . 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 Other chronic pain 12/03/2018  . Dizziness of unknown cause 08/04/2018  . Tremor 07/15/2018  . Atrial flutter with rapid ventricular response ([place]) 07/10/2017  . Atrial fibrillation ([place]) 07/05/2017  . COPD (chronic obstructive pulmonary disease) ([place]) 01/22/2017  . Pedal edema   . Acute on chronic congestive heart failure ([place])   . Chronic bilateral low back pain without sciatica 08/15/2016  . 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 Schizoaffective disorder, bipolar type ([place])   . Osteoarthritis, multiple sites 06/08/2011  . Dysuria 02/14/2011  . CHRONIC KIDNEY DISEASE STAGE II (MILD) 09/14/2009  . Hypothyroidism 08/31/2006  . Type 2 diabetes mellitus ([place]) 08/31/2006  . HYPERCHOLESTEROLEMIA 08/31/2006  . Tobacco abuse 08/31/2006  . HYPERTENSION, BENIGN SYSTEMIC 08/31/2006   Past Medical History:  Diagnosis Date  . Arthritis    "real bad; all over" (07/12/2017)  . Asthma   . BIPOLAR DISORDER 08/31/2006   Qualifier: Diagnosis of  By: Dorathy Daft MD, Marjory Lies    . CHRONIC KIDNEY DISEASE STAGE II (MILD) 09/14/2009   Annotation: eGFR 64 Qualifier: Diagnosis of  By: Jess Barters MD, [name]    . Chronic lower back pain   . COPD (chronic obstructive pulmonary disease) ([place]) 09/23/2010   Diagnosed at [facility] in 2008 (Dr. Annamaria Boots)   . DM (diabetes mellitus) type II controlled with renal manifestation ([place]) 08/31/2006   Qualifier: Diagnosis of  By: Dorathy Daft MD, Marjory Lies    . Dyspnea    "all my life; since 6th grade"  (07/12/2017)  . HYPERCHOLESTEROLEMIA 08/31/2006   Intolerance to Lipitor OK on Crestor but medicaid no longer covering    . HYPERTENSION, BENIGN SYSTEMIC 08/31/2006   Qualifier: Diagnosis of  By: Dorathy Daft MD, Marjory Lies    . HYPOTHYROIDISM, UNSPECIFIED 08/31/2006   Qualifier: Diagnosis of  By: Dorathy Daft MD, Marjory Lies    . Leg swelling 03/14/2018  . Pneumonia    "3 times" (07/12/2017)  . Scoliosis     Family History  Problem Relation Age of Onset  . Breast cancer Sister     Past Surgical History:  Procedure Laterality Date  . CESAREAN SECTION    . FOOT FRACTURE SURGERY Right    "steel plate in it"  . FOOT SURGERY     " born w/dislocated foot"  . FRACTURE SURGERY    .  KNEE ARTHROSCOPY Right   . TOE SURGERY Bilateral    "both pinky toes"  . TONSILLECTOMY AND ADENOIDECTOMY     Social History   Occupational History  . Not on file  Tobacco Use  . Smoking status: Current Every Day Smoker    Packs/day: 0.50    Years: 45.00    Pack years: 22.50    Types: Cigarettes  . Smokeless tobacco: Never Used  Substance and Sexual Activity  . Alcohol use: Yes    Comment: 07/12/2017 "stopped 2 yr ago; did have a drink over the holidays recently"  . Drug use: No  . Sexual activity: Yes

## 2019-05-19 ENCOUNTER — Other Ambulatory Visit: Payer: Self-pay | Admitting: Family Medicine

## 2019-05-31 ENCOUNTER — Other Ambulatory Visit: Payer: Self-pay | Admitting: Family Medicine

## 2019-05-31 MED ORDER — AMLODIPINE BESYLATE 5 MG PO TABS: 5.0000 mg | ORAL_TABLET | Freq: Every day | ORAL | 3 refills | Status: DC

## 2019-05-31 NOTE — Progress Notes (Signed)
[facility] After Hours Call  Patient consented to have virtual visit. Method of visit: Telephone  Encounter participants: Patient: [name] Long - located at Home Provider: Daisy Floro - located at [facility]  Chief Complaint: needs Norvasc Refill  HPI: Patient is prescribed Norvasc 5mg  daily for Hypertension. Patient states she has been trying to get her Norvasc refilled since Tuesday but the clinic has been closed. In her med history it appears that 30 tablets of Amlodipine 5mg  with 11 refills was ordered on 04/13/2019. I reached out to patient's pharmacy, [facility] [phone]), who stated they did not see the order in the computer. Because this is a blood pressure medication and the clinic will not be open for 2 more days I went ahead and refilled it for Amlodipine 5mg  90 tablets w/ 3 refills. I called the pharmacy back to confirm that they received the prescription, they confirmed they did receive it.  The patient had no symptoms, including headache, dizziness, vision changes, chest pain, shortness of breath, or abdominal pain. I encouraged her to follow up with [place] in the clinic when we return to normal business hours on Monday, 06/03/2019 after the Thanksgiving Holiday. The patient was also instructed to call the after hours line back if she had any questions, concerns, or new symptoms, and also to not hesitate to reach out to the Emergency Department should she need help more imminently.   ROS: per HPI  Pertinent PMHx: HTN  Exam:  Respiratory: Speaking in full sentences  Assessment/Plan: HTN: refilled Norvasc 5mg  -Instructed to call Monday for an appt. -Will notify PCP    Time spent during visit with patient: 5 minutes  Milus Banister, [place], PGY-2 05/31/2019 12:41 PM

## 2019-06-03 DIAGNOSIS — R002 Palpitations: Secondary | ICD-10-CM | POA: Diagnosis not present

## 2019-06-03 DIAGNOSIS — I10 Essential (primary) hypertension: Secondary | ICD-10-CM | POA: Diagnosis not present

## 2019-06-03 DIAGNOSIS — J4520 Mild intermittent asthma, uncomplicated: Secondary | ICD-10-CM | POA: Diagnosis not present

## 2019-06-03 DIAGNOSIS — R0602 Shortness of breath: Secondary | ICD-10-CM | POA: Diagnosis not present

## 2019-06-19 ENCOUNTER — Other Ambulatory Visit: Payer: Self-pay | Admitting: Family Medicine

## 2019-06-19 DIAGNOSIS — I10 Essential (primary) hypertension: Secondary | ICD-10-CM

## 2019-06-20 ENCOUNTER — Other Ambulatory Visit: Payer: Self-pay

## 2019-06-20 MED ORDER — LEVOTHYROXINE SODIUM 25 MCG PO TABS: 25.0000 ug | ORAL_TABLET | Freq: Every day | ORAL | 3 refills | Status: DC

## 2019-06-20 MED ORDER — ROSUVASTATIN CALCIUM 20 MG PO TABS: 20.0000 mg | ORAL_TABLET | Freq: Every day | ORAL | 11 refills | Status: DC

## 2019-06-21 ENCOUNTER — Other Ambulatory Visit: Payer: Self-pay

## 2019-06-21 ENCOUNTER — Telehealth: Payer: Self-pay | Admitting: Family Medicine

## 2019-06-21 NOTE — Telephone Encounter (Signed)
Patient needs refill for her blood sugar strips, called [facility]. (She wanted me to send to the clinic nurse as well) Thank you.

## 2019-06-22 MED ORDER — FREESTYLE LITE TEST VI STRP: ORAL_STRIP | 12 refills | Status: DC

## 2019-06-25 ENCOUNTER — Telehealth: Payer: Self-pay | Admitting: Orthopedic Surgery

## 2019-06-25 ENCOUNTER — Other Ambulatory Visit: Payer: Self-pay | Admitting: Physician Assistant

## 2019-06-25 MED ORDER — HYDROCODONE-ACETAMINOPHEN 5-325 MG PO TABS: 1.0000 | ORAL_TABLET | Freq: Four times a day (QID) | ORAL | 0 refills | Status: DC | PRN

## 2019-06-25 NOTE — Telephone Encounter (Signed)
Patient called and requesting that Dr. Sharol Given give her a call back. Patient states she is in severe pain and asking that Dr. Sharol Given give her a cortisone shot in left shoulder and jel shot in her left knee before her shots are to be given again. Patient states leg keep giving away and it started 4 hours ago. Patient also asked if Dr. Sharol Given can not see her today if he can call in prescription of hydrocodone for pains in back,knees, and arms. Patient stated pains are due to weather. [facility]. Patient requested all of this information be put into this note. Patient states she needs for Dr. Sharol Given to call as soon as possible. Patient phone number is [phone].

## 2019-06-25 NOTE — Telephone Encounter (Signed)
Rx needed

## 2019-06-25 NOTE — Telephone Encounter (Signed)
I will refill

## 2019-06-25 NOTE — Telephone Encounter (Signed)
Patient called. She would like to see if she's able to get a gel shot or a cortisone shot. She says her knee is in severe pain.  Call back number: [phone]

## 2019-06-25 NOTE — Telephone Encounter (Signed)
The pt is s/p a right knee monovisc injection 05/13/19 the left knee will be due next month. Advised that we can not order the injection until after first of the year and states that she is having significant pain. She is requesting a refill on her pain medication the last refill was in November. Asking if she can have this to get her through until her injection can be ordered.

## 2019-06-25 NOTE — Telephone Encounter (Signed)
Please do not forget to fax pain medication to pharm. I have already called the pt and advised that this was approved today.

## 2019-06-26 ENCOUNTER — Ambulatory Visit (INDEPENDENT_AMBULATORY_CARE_PROVIDER_SITE_OTHER): Payer: Medicare Other | Admitting: Family Medicine

## 2019-06-26 ENCOUNTER — Other Ambulatory Visit: Payer: Self-pay

## 2019-06-26 ENCOUNTER — Ambulatory Visit: Payer: Medicare Other

## 2019-06-26 ENCOUNTER — Encounter: Payer: Self-pay | Admitting: Internal Medicine

## 2019-06-26 VITALS — BP 138/72 | HR 84 | Wt 169.6 lb

## 2019-06-26 DIAGNOSIS — E119 Type 2 diabetes mellitus without complications: Secondary | ICD-10-CM | POA: Diagnosis present

## 2019-06-26 DIAGNOSIS — R1310 Dysphagia, unspecified: Secondary | ICD-10-CM

## 2019-06-26 LAB — POCT GLYCOSYLATED HEMOGLOBIN (HGB A1C): HbA1c, POC (controlled diabetic range): 9.3 % — AB (ref 0.0–7.0)

## 2019-06-26 MED ORDER — PANTOPRAZOLE SODIUM 40 MG PO TBEC: 40.0000 mg | DELAYED_RELEASE_TABLET | Freq: Every day | ORAL | 0 refills | Status: DC

## 2019-06-26 NOTE — Patient Instructions (Addendum)
It was great meeting you today!  I am sorry about your trouble swallowing.  I am sure the food getting caught in her throat is causing a lot of anxiety problems.  I do think it would be a good idea for you to see a gastroenterologist for this problem.  There is really no way to know what is causing the trouble swallowing without doing a scope.  I placed this referral today you should hear from them in the next 1 to 2 weeks.  I will also start you on a medication called Protonix, also known as pantoprazole.  You will take 40 mg a day up until you see the GI specialist.  I would advise you to schedule an appointment with your primary care provider to discuss diabetes management.  Your A1c is up a little bit from last time.

## 2019-06-26 NOTE — Telephone Encounter (Signed)
Hydrocodone called in

## 2019-07-02 DIAGNOSIS — R1310 Dysphagia, unspecified: Secondary | ICD-10-CM | POA: Insufficient documentation

## 2019-07-02 NOTE — Progress Notes (Signed)
   HPI 68 year old female who presents with dysphagia. She feels that food and large tablets are getting stuck in her esophagus and she is having a choking and uncomfortable sensation until she finally clears it. She feels so anxiety during this episodes. She blames this on her synthroid dose being decreased.  Upon review her TSH was 0.289 back on 7/10. Her synthroid was reduced to 69mcg. Recheck of TSH was 0.745 on 9/18. I explained that her synthroid dose being reduced was highly unlikely to be causing her symptoms and were very reasonable medication changes to be made that I agreed with.   I explained that the sensation of the food being stuck is concerning for a narrowing or blockage of the esophagus and this needed to be investigated by a gastroenterologist. Of note the patient has never had a colonoscopy.  CC: "food getting stuck in my throat"   ROS:  Review of Systems See HPI for ROS.   CC, SH/smoking status, and VS noted  Objective: BP 138/72   Pulse 84   Wt 169 lb 9.6 oz (76.9 kg)   SpO2 97%   BMI 28.22 kg/m  Gen: 68 year old AA female, no acute distress HEENT: Mallampati III, no visible blockade in posterior pharynx CV: rrr, no m/r/g Resp: lungs clear to auscultation bilaterally Abd: s, nt, nd Neuro: Alert and oriented, Speech clear, No gross deficits   Assessment and plan:  Dysphagia Patient with symptoms of food and large pills getting stuck which is apparently progressive for 5 months. Will refer to Gastroenterology for likely EGD. No colonoscopy in the past so would likely benefit from this as well. Will change omeprazole 20mg  to pantoprazole 40mg  in the meantime for medicaid formulary.   Orders Placed This Encounter  Procedures  . Ambulatory referral to Gastroenterology    Referral Priority:   Routine    Referral Type:   Consultation    Referral Reason:   Specialty Services Required    Number of Visits Requested:   1  . HgB A1c    Meds ordered this  encounter  Medications  . pantoprazole (PROTONIX) 40 MG tablet    Sig: Take 1 tablet (40 mg total) by mouth daily.    Dispense:  90 tablet    Refill:  0   Guadalupe Dawn MD PGY-3 Family Medicine Resident  07/02/2019 11:33 PM

## 2019-07-02 NOTE — Assessment & Plan Note (Signed)
Patient with symptoms of food and large pills getting stuck which is apparently progressive for 5 months. Will refer to Gastroenterology for likely EGD. No colonoscopy in the past so would likely benefit from this as well. Will change omeprazole 20mg  to pantoprazole 40mg  in the meantime for medicaid formulary.

## 2019-07-02 NOTE — Assessment & Plan Note (Signed)
a1c 9.3 from 8.8. Recommended pcp follow up for further management.

## 2019-07-08 ENCOUNTER — Telehealth: Payer: Self-pay | Admitting: Orthopedic Surgery

## 2019-07-08 NOTE — Telephone Encounter (Signed)
Is there a staff message for gel injection on this pt? I did speak to her after the cut off for authorization submissions last month just want to follow up and make sure I forwarded to you.Thanks

## 2019-07-08 NOTE — Telephone Encounter (Signed)
Yes, I have it.  Thank you.

## 2019-07-08 NOTE — Telephone Encounter (Signed)
Patient called and requesting for insurance approval for injections with Dr. Sharol Given. Patient states to need Monovisc injection in left knee and Cortizone shot in left shoulder. Patient is asking for a call back with appointment for injections. Patient phone number is [phone].

## 2019-07-11 ENCOUNTER — Telehealth: Payer: Self-pay | Admitting: *Deleted

## 2019-07-11 NOTE — Telephone Encounter (Signed)
Pt states that since switching to pantoprazole that she is always sleepy and sick to her stomach.  She would like to know "what to do now?"  Christen Bame, CMA

## 2019-07-11 NOTE — Telephone Encounter (Signed)
These let [name] Long know that pantoprazole should not cause her to feel sleepy, but she can stop this medication if she would like and either go without a medication for her acid reflux or start Pepcid instead.  She can find Pepcid over-the-counter or I can prescribe it to her if she would like.  Thanks.

## 2019-07-12 ENCOUNTER — Telehealth: Payer: Self-pay

## 2019-07-12 NOTE — Telephone Encounter (Signed)
Submitted VOB for Monovisc, left knee. 

## 2019-07-15 ENCOUNTER — Telehealth: Payer: Self-pay

## 2019-07-15 DIAGNOSIS — R072 Precordial pain: Secondary | ICD-10-CM | POA: Diagnosis not present

## 2019-07-15 DIAGNOSIS — R0602 Shortness of breath: Secondary | ICD-10-CM | POA: Diagnosis not present

## 2019-07-15 DIAGNOSIS — J4520 Mild intermittent asthma, uncomplicated: Secondary | ICD-10-CM | POA: Diagnosis not present

## 2019-07-15 DIAGNOSIS — I425 Other restrictive cardiomyopathy: Secondary | ICD-10-CM | POA: Diagnosis not present

## 2019-07-15 NOTE — Telephone Encounter (Signed)
Patient aware that she is approved for gel injection.  Approved for Monovisc, left knee. Buy & Bill Must meet Medicare deductible first Patient will be responsible for 20% OOP. No Co-pay No PA required  Appt. 08/06/2019 with Dr. Sharol Given

## 2019-07-16 ENCOUNTER — Other Ambulatory Visit: Payer: Self-pay | Admitting: Family Medicine

## 2019-07-16 MED ORDER — OMEPRAZOLE 20 MG PO CPDR: 20.0000 mg | DELAYED_RELEASE_CAPSULE | Freq: Every day | ORAL | 3 refills | Status: DC

## 2019-07-16 NOTE — Telephone Encounter (Signed)
Spoke with patient, she does not want Prilosec, she wants Omeprazole. Patient stated she was on Omeprazole until December when she was switched to Protonix. Patient is not tolerating Protonix well and would like to go back to Omeprazole. Patient has an apt with GI on 1/26. Please advise.

## 2019-07-16 NOTE — Telephone Encounter (Signed)
Pt is calling because she needs a refill on her Prilosec since the new medication make her sleepy. Please call her once completed so she can go pick up at the pharmacy. jw

## 2019-07-17 NOTE — Telephone Encounter (Signed)
Omeprazole Rx sent to pharmacy. Ottis Stain, CMA

## 2019-07-18 ENCOUNTER — Ambulatory Visit (INDEPENDENT_AMBULATORY_CARE_PROVIDER_SITE_OTHER): Payer: Medicare Other

## 2019-07-18 ENCOUNTER — Other Ambulatory Visit: Payer: Self-pay

## 2019-07-18 VITALS — Ht 63.0 in | Wt 168.0 lb

## 2019-07-18 DIAGNOSIS — Z0000 Encounter for general adult medical examination without abnormal findings: Secondary | ICD-10-CM | POA: Diagnosis not present

## 2019-07-18 NOTE — Progress Notes (Addendum)
Subjective:   [name] Long is a 69 y.o. female who presents for Medicare Annual (Subsequent) preventive examination.  The patient consented to a virtual visit.  Review of Systems: Defer to PCP  Cardiac Risk Factors include: advanced age (>1mn, >>68women);diabetes mellitus;hypertension  Objective:   Vitals: Ht '5\' 3"'  (1.6 m)   Wt 168 lb (76.2 kg)   BMI 29.76 kg/m   Body mass index is 29.76 kg/m.  Advanced Directives 07/18/2019 01/11/2019 09/08/2018 07/13/2018 04/30/2018 04/16/2018 02/23/2018  Does Patient Have a Medical Advance Directive? No No No No No No No  Type of Advance Directive - - - - - - -  Does patient want to make changes to medical advance directive? - - - - - - -  Copy of [place]? - - - - - - -  Would patient like information on creating a medical advance directive? Yes (MAU/Ambulatory/Procedural Areas - Information given) No - Patient declined No - Patient declined No - Patient declined No - Patient declined No - Patient declined No - Patient declined  Some encounter information is confidential and restricted. Go to Review Flowsheets activity to see all data.   Tobacco Social History   Tobacco Use  Smoking Status Current Some Day Smoker  . Packs/day: 0.50  . Years: 45.00  . Pack years: 22.50  . Types: Cigarettes  Smokeless Tobacco Never Used  Tobacco Comment   patient states she smokes 1 cigarette per month     Comment: Patient stated she has moved into a non smoking apartment building. Because of this and the cold, she states she only has one cigarette per month "if that." Will continue to monitor.  Clinical Intake:  Pre-visit preparation completed: Yes  How often do you need to have someone help you when you read instructions, pamphlets, or other written materials from your doctor or pharmacy?: 2 - Rarely What is the last grade level you completed in school?: high school  Interpreter Needed?: No  Past Medical History:    Diagnosis Date  . Arthritis    "real bad; all over" (07/12/2017)  . Asthma   . BIPOLAR DISORDER 08/31/2006   Qualifier: Diagnosis of  By: LDorathy DaftMD, AMarjory Lies   . CHRONIC KIDNEY DISEASE STAGE II (MILD) 09/14/2009   Annotation: eGFR 64 Qualifier: Diagnosis of  By: RJess BartersMD, ECindee Salt   . Chronic lower back pain   . COPD (chronic obstructive pulmonary disease) ([place] 09/23/2010   [facility] Y[facility]   . DM (diabetes mellitus) type II controlled with renal manifestation ([place] 08/31/2006   Qualifier: Diagnosis of  By: LDorathy DaftMD, AMarjory Lies   . Dyspnea    "all my life; since 6th grade" (07/12/2017)  . HYPERCHOLESTEROLEMIA 08/31/2006   Intolerance to Lipitor OK on Crestor but medicaid no longer covering    . HYPERTENSION, BENIGN SYSTEMIC 08/31/2006   Qualifier: Diagnosis of  By: LDorathy DaftMD, AMarjory Lies   . HYPOTHYROIDISM, UNSPECIFIED 08/31/2006   Qualifier: Diagnosis of  By: LDorathy DaftMD, AMarjory Lies   . Leg swelling 03/14/2018  . Pneumonia    "3 times" (07/12/2017)  . Scoliosis    Past Surgical History:  Procedure Laterality Date  . CESAREAN SECTION    . FOOT FRACTURE SURGERY Right    "steel plate in it"  . FOOT SURGERY     " born w/dislocated foot"  . FRACTURE SURGERY    . KNEE ARTHROSCOPY Right   .  TOE SURGERY Bilateral    "both pinky toes"  . TONSILLECTOMY AND ADENOIDECTOMY     Family History  Problem Relation Age of Onset  . Breast cancer Sister   . Cancer Mother   . Heart disease Mother   . Diabetes Father   . Hypertension Father   . Heart disease Brother   . Asthma Daughter   . Asthma Son   . Breast cancer Sister   . Asthma Daughter    Social History   Socioeconomic History  . Marital status: Divorced    Spouse name: Not on file  . Number of children: 4  . Years of education: 63  . Highest education level: High school graduate  Occupational History  . Not on file  Tobacco Use  . Smoking status: Current Some Day Smoker    Packs/day: 0.50    Years:  45.00    Pack years: 22.50    Types: Cigarettes  . Smokeless tobacco: Never Used  . Tobacco comment: patient states she smokes 1 cigarette per month  Substance and Sexual Activity  . Alcohol use: Yes    Comment: 07/12/2017 "stopped 2 yr ago; did have a drink over the holidays recently"  . Drug use: No  . Sexual activity: Not Currently  Other Topics Concern  . Not on file  Social History Narrative   Patient lives alone in [place].    Patient does not drive, she uses insurance transportation or her children.   Patient enjoys twirling her paton, listening to music, dancing, and spending time with family.    Patient enjoys celebrating all holidays and birthdays of loved ones.   Social Determinants of Health   Financial Resource Strain: Low Risk   . Difficulty of Paying Living Expenses: Not very hard  Food Insecurity: No Food Insecurity  . Worried About Charity fundraiser in the Last Year: Never true  . Ran Out of Food in the Last Year: Never true  Transportation Needs: No Transportation Needs  . Lack of Transportation (Medical): No  . Lack of Transportation (Non-Medical): No  Physical Activity: Inactive  . Days of Exercise per Week: 0 days  . Minutes of Exercise per Session: 0 min  Stress: No Stress Concern Present  . Feeling of Stress : Only a little  Social Connections: Slightly Isolated  . Frequency of Communication with Friends and Family: More than three times a week  . Frequency of Social Gatherings with Friends and Family: More than three times a week  . Attends Religious Services: More than 4 times per year  . Active Member of Clubs or Organizations: Yes  . Attends Archivist Meetings: 1 to 4 times per year  . Marital Status: Divorced   Outpatient Encounter Medications as of 07/18/2019  Medication Sig  . albuterol (VENTOLIN HFA) 108 (90 Base) MCG/ACT inhaler Inhale 2 puffs PO every 4 hours PRN for wheezing  . Fluticasone-Umeclidin-Vilant 100-62.5-25 MCG/INH  AEPB Inhale 100 mcg into the lungs daily.  Marland Kitchen glucose blood (FREESTYLE LITE) test strip USE TO TEST ONCE DAILY E11.29  . glucose blood (FREESTYLE TEST STRIPS) test strip Use to test once daily. Dx code E 11.29  . glucose blood (FREESTYLE TEST STRIPS) test strip Use once daily.  Diagnosis code E11.29  . hydrochlorothiazide (HYDRODIURIL) 25 MG tablet TAKE 1 TABLET BY MOUTH EVERY DAY  . HYDROcodone-acetaminophen (NORCO/VICODIN) 5-325 MG tablet Take 1 tablet by mouth every 6 (six) hours as needed for moderate pain.  . Lancets (FREESTYLE)  lancets Use as instructed to test sugars once daily.  Dx Code: E11.9.  . levothyroxine (SYNTHROID) 25 MCG tablet Take 1 tablet (25 mcg total) by mouth daily before breakfast.  . metFORMIN (GLUCOPHAGE) 1000 MG tablet Take 1 tablet (1,000 mg total) by mouth 2 (two) times daily with a meal.  . metoprolol succinate (TOPROL-XL) 50 MG 24 hr tablet Take 50 mg by mouth daily. Take with or immediately following a meal.  . olopatadine (PATANOL) 0.1 % ophthalmic solution Place 1 drop into both eyes 2 (two) times daily as needed for allergies.  Marland Kitchen omeprazole (PRILOSEC) 20 MG capsule Take 1 capsule (20 mg total) by mouth daily.  . potassium chloride (K-DUR) 10 MEQ tablet TAKE 1 TABLET BY MOUTH EVERY DAY  . potassium chloride (K-DUR,KLOR-CON) 10 MEQ tablet Take 1 tablet (10 mEq total) by mouth daily.  . risperiDONE (RISPERDAL) 1 MG tablet Take 1 tablet (1 mg total) by mouth 2 (two) times daily.  . rosuvastatin (CRESTOR) 20 MG tablet Take 1 tablet (20 mg total) by mouth daily.  Marland Kitchen amLODipine (NORVASC) 5 MG tablet Take 1 tablet (5 mg total) by mouth daily. (Patient not taking: Reported on 07/18/2019)  . Multiple Vitamins-Minerals (MULTIVITAMIN WOMEN) TABS Take 1 tablet by mouth daily. (Patient not taking: Reported on 07/18/2019)  . pantoprazole (PROTONIX) 40 MG tablet Take 1 tablet (40 mg total) by mouth daily. (Patient not taking: Reported on 07/18/2019)  . predniSONE (DELTASONE) 10 MG  tablet Take 1 tablet (10 mg total) by mouth daily with breakfast. (Patient not taking: Reported on 07/18/2019)  . [DISCONTINUED] potassium chloride (K-DUR) 10 MEQ tablet Take 1 tablet (10 mEq total) by mouth daily.   No facility-administered encounter medications on file as of 07/18/2019.   Activities of Daily Living In your present state of health, do you have any difficulty performing the following activities: 07/18/2019 07/18/2019  Hearing? - N  Vision? - N  Difficulty concentrating or making decisions? - N  Walking or climbing stairs? - N  Dressing or bathing? - N  Doing errands, shopping? (No Data) N  Comment does not drive- uses transportation or relies on children -  Preparing Food and eating ? - N  Using the Toilet? - N  In the past six months, have you accidently leaked urine? - N  Do you have problems with loss of bowel control? - N  Managing your Medications? - N  Managing your Finances? - N  Some recent data might be hidden   Patient Care Team: Kathrene Alu, MD as PCP - General (Family Medicine) Newt Minion, MD as Consulting Physician (Orthopedic Surgery) Dixie Dials, MD as Consulting Physician (Cardiology)    Assessment:   This is a routine wellness examination for [name] Long.  Exercise Activities and Dietary recommendations Current Exercise Habits: The patient does not participate in regular exercise at present, Exercise limited by: psychological condition(s)  Goals    . Exercise 3x per week (30 min per time)     Walk down your apartment hallway and back 15 mins in AM and 15 mins in PM       Fall Risk Fall Risk  07/18/2019 06/26/2019 07/13/2018 04/30/2018 04/16/2018  Falls in the past year? 0 0 0 No No  Number falls in past yr: - 0 - - -  Injury with Fall? - - - - -  Follow up - - - - -   Is the patient's home free of loose throw rugs in walkways, pet beds,  electrical cords, etc?   yes      Grab bars in the bathroom? yes      Handrails on the stairs?    yes      Adequate lighting?   yes  Patient rating of health (0-10) scale: 10   Depression Screen PHQ 2/9 Scores 07/18/2019 06/26/2019 01/11/2019 07/13/2018  PHQ - 2 Score 0 3 0 0  Exception Documentation Other- indicate reason in comment box - - -    Cognitive Function  6CIT Screen 07/18/2019  What Year? 0 points  What month? 0 points  What time? 0 points  Count back from 20 0 points  Months in reverse 0 points  Repeat phrase 0 points  Total Score 0   Immunization History  Administered Date(s) Administered  . Influenza Split 05/17/2011, 04/27/2012  . Influenza Whole 07/11/2007, 06/30/2008, 06/02/2009, 04/22/2010  . Influenza,inj,Quad PF,6+ Mos 04/16/2013, 05/02/2014, 04/26/2017, 02/23/2018, 04/11/2019  . Pneumococcal Conjugate-13 04/26/2017  . Pneumococcal Polysaccharide-23 07/11/2007, 06/08/2011  . Td 08/04/1998  . Tdap 06/08/2011   Screening Tests Health Maintenance  Topic Date Due  . COLONOSCOPY  07/24/2001  . FOOT EXAM  04/26/2018  . PNA vac Low Risk Adult (2 of 2 - PPSV23) 04/26/2018  . OPHTHALMOLOGY EXAM  08/08/2019  . LIPID PANEL  09/19/2019  . HEMOGLOBIN A1C  12/25/2019  . MAMMOGRAM  02/17/2020  . URINE MICROALBUMIN  03/21/2020  . TETANUS/TDAP  06/07/2021  . INFLUENZA VACCINE  Completed  . DEXA SCAN  Completed  . Hepatitis C Screening  Completed   Cancer Screenings: Lung: Low Dose CT Chest recommended if Age 81-80 years, 30 pack-year currently smoking OR have quit w/in 15years. Patient does qualify. Breast:  Up to date on Mammogram? Yes   Up to date of Bone Density/Dexa? Yes Colorectal: has never had one- counseling given-does have a GI apt with Pyrtle 1/26  Additional Screenings: Hepatitis C Screening: Completed  Plan:  Start walking your apartment hallway as we discussed!  15 mins AM and 15 mins PM! This will help lower your blood pressure and A1c. GI apt January 26th and PCP apt February 26th. Pick up an advance directive packet to complete with a  family member or trusted loved one.   I have personally reviewed and noted the following in the patient's chart:   . Medical and social history . Use of alcohol, tobacco or illicit drugs  . Current medications and supplements . Functional ability and status . Nutritional status . Physical activity . Advanced directives . List of other physicians . Hospitalizations, surgeries, and ER visits in previous 12 months . Vitals . Screenings to include cognitive, depression, and falls . Referrals and appointments  In addition, I have reviewed and discussed with patient certain preventive protocols, quality metrics, and best practice recommendations. A written personalized care plan for preventive services as well as general preventive health recommendations were provided to patient. This visit was conducted virtually in the setting of the [place] pandemic.    Dorna Bloom, [place]  07/18/2019   I have reviewed this visit and agree with the documentation.

## 2019-07-18 NOTE — Patient Instructions (Addendum)
You spoke to [name] Long, [place] over the phone for your annual wellness visit.  We discussed goals:  Goals    . Exercise 3x per week (30 min per time)     Walk down your apartment hallway and back 15 mins in AM and 15 mins in PM       We also discussed recommended health maintenance. As discussed, you are pretty much up to date with everything. We discussed the benefits of a colonoscopy, keep your scheduled GI apt. All other health maintenance can be discussed at next PCP visit on 2/26.  Health Maintenance  Topic Date Due  . COLONOSCOPY  07/24/2001  . FOOT EXAM  04/26/2018  . PNA vac Low Risk Adult (2 of 2 - PPSV23) 04/26/2018  . OPHTHALMOLOGY EXAM  08/08/2019  . LIPID PANEL  09/19/2019  . HEMOGLOBIN A1C  12/25/2019  . MAMMOGRAM  02/17/2020  . URINE MICROALBUMIN  03/21/2020  . TETANUS/TDAP  06/07/2021  . INFLUENZA VACCINE  Completed  . DEXA SCAN  Completed  . Hepatitis C Screening  Completed   Start walking your apartment hallway as we discussed!  15 mins AM and 15 mins PM! This will help lower your blood pressure and A1c. GI apt January 26th and PCP apt February 26th. Pick up an advance directive packet to complete with a family member or trusted loved one.   Call 1800-QUIT-NOW for help with stopping smoking.  Preventive Care 98 Years and Older, Female Preventive care refers to lifestyle choices and visits with your health care provider that can promote health and wellness. This includes:  A yearly physical exam. This is also called an annual well check.  Regular dental and eye exams.  Immunizations.  Screening for certain conditions.  Healthy lifestyle choices, such as diet and exercise. What can I expect for my preventive care visit? Physical exam Your health care provider will check:  Height and weight. These may be used to calculate body mass index (BMI), which is a measurement that tells if you are at a healthy weight.  Heart rate and blood pressure.  Your  skin for abnormal spots. Counseling Your health care provider may ask you questions about:  Alcohol, tobacco, and drug use.  Emotional well-being.  Home and relationship well-being.  Sexual activity.  Eating habits.  History of falls.  Memory and ability to understand (cognition).  Work and work Statistician.  Pregnancy and menstrual history. What immunizations do I need?  Influenza (flu) vaccine  This is recommended every year. Tetanus, diphtheria, and pertussis (Tdap) vaccine  You may need a Td booster every 10 years. Varicella (chickenpox) vaccine  You may need this vaccine if you have not already been vaccinated. Zoster (shingles) vaccine  You may need this after age 89. Pneumococcal conjugate (PCV13) vaccine  One dose is recommended after age 46. Pneumococcal polysaccharide (PPSV23) vaccine  One dose is recommended after age 61. Measles, mumps, and rubella (MMR) vaccine  You may need at least one dose of MMR if you were born in 1957 or later. You may also need a second dose. Meningococcal conjugate (MenACWY) vaccine  You may need this if you have certain conditions. Hepatitis A vaccine  You may need this if you have certain conditions or if you travel or work in places where you may be exposed to hepatitis A. Hepatitis B vaccine  You may need this if you have certain conditions or if you travel or work in places where you may be exposed  to hepatitis B. Haemophilus influenzae type b (Hib) vaccine  You may need this if you have certain conditions. You may receive vaccines as individual doses or as more than one vaccine together in one shot (combination vaccines). Talk with your health care provider about the risks and benefits of combination vaccines. What tests do I need? Blood tests  Lipid and cholesterol levels. These may be checked every 5 years, or more frequently depending on your overall health.  Hepatitis C test.  Hepatitis B  test. Screening  Lung cancer screening. You may have this screening every year starting at age 33 if you have a 30-pack-year history of smoking and currently smoke or have quit within the past 15 years.  Colorectal cancer screening. All adults should have this screening starting at age 69 and continuing until age 72. Your health care provider may recommend screening at age 85 if you are at increased risk. You will have tests every 1-10 years, depending on your results and the type of screening test.  Diabetes screening. This is done by checking your blood sugar (glucose) after you have not eaten for a while (fasting). You may have this done every 1-3 years.  Mammogram. This may be done every 1-2 years. Talk with your health care provider about how often you should have regular mammograms.  BRCA-related cancer screening. This may be done if you have a family history of breast, ovarian, tubal, or peritoneal cancers. Other tests  Sexually transmitted disease (STD) testing.  Bone density scan. This is done to screen for osteoporosis. You may have this done starting at age 59. Follow these instructions at home: Eating and drinking  Eat a diet that includes fresh fruits and vegetables, whole grains, lean protein, and low-fat dairy products. Limit your intake of foods with high amounts of sugar, saturated fats, and salt.  Take vitamin and mineral supplements as recommended by your health care provider.  Do not drink alcohol if your health care provider tells you not to drink.  If you drink alcohol: ? Limit how much you have to 0-1 drink a day. ? Be aware of how much alcohol is in your drink. In the [place] one drink equals one 12 oz bottle of beer (355 mL), one 5 oz glass of wine (148 mL), or one 1 oz glass of hard liquor (44 mL). Lifestyle  Take daily care of your teeth and gums.  Stay active. Exercise for at least 30 minutes on 5 or more days each week.  Do not use any products that  contain nicotine or tobacco, such as cigarettes, e-cigarettes, and chewing tobacco. If you need help quitting, ask your health care provider.  If you are sexually active, practice safe sex. Use a condom or other form of protection in order to prevent STIs (sexually transmitted infections).  Talk with your health care provider about taking a low-dose aspirin or statin. What's next?  Go to your health care provider once a year for a well check visit.  Ask your health care provider how often you should have your eyes and teeth checked.  Stay up to date on all vaccines. This information is not intended to replace advice given to you by your health care provider. Make sure you discuss any questions you have with your health care provider. Document Revised: 06/14/2018 Document Reviewed: 06/14/2018 Elsevier Patient Education  [place].  Colonoscopy, Adult A colonoscopy is a procedure to look at the entire large intestine. This procedure is done using  a long, thin, flexible tube that has a camera on the end. You may have a colonoscopy:  As a part of normal colorectal screening.  If you have certain symptoms, such as: ? A low number of red blood cells in your blood (anemia). ? Diarrhea that does not go away. ? Pain in your abdomen. ? Blood in your stool. A colonoscopy can help screen for and diagnose medical problems, including:  Tumors.  Extra tissue that grows where mucus forms (polyps).  Inflammation.  Areas of bleeding. Tell your health care provider about:  Any allergies you have.  All medicines you are taking, including vitamins, herbs, eye drops, creams, and over-the-counter medicines.  Any problems you or family members have had with anesthetic medicines.  Any blood disorders you have.  Any surgeries you have had.  Any medical conditions you have.  Any problems you have had with having bowel movements.  Whether you are pregnant or may be pregnant. What  are the risks? Generally, this is a safe procedure. However, problems may occur, including:  Bleeding.  Damage to your intestine.  Allergic reactions to medicines given during the procedure.  Infection. This is rare. What happens before the procedure? Eating and drinking restrictions Follow instructions from your health care provider about eating or drinking restrictions, which may include:  A few days before the procedure: ? Follow a low-fiber diet. ? Avoid nuts, seeds, dried fruit, raw fruits, and vegetables.  1-3 days before the procedure: ? Eat only gelatin dessert or ice pops. ? Drink only clear liquids, such as water, clear juice, clear broth or bouillon, black coffee or tea, or clear soft drinks or sports drinks. ? Avoid liquids that contain red or purple dye.  The day of the procedure: ? Do not eat solid foods. You may continue to drink clear liquids until up to 2 hours before the procedure. ? Do not eat or drink anything starting 2 hours before the procedure, or within the time period that your health care provider recommends. Bowel prep If you were prescribed a bowel prep to take by mouth (orally) to clean out your colon:  Take it as told by your health care provider. Starting the day before your procedure, you will need to drink a large amount of liquid medicine. The liquid will cause you to have many bowel movements of loose stool until your stool becomes almost clear or light green.  If your skin or the opening between the buttocks (anus) gets irritated from diarrhea, you may relieve the irritation using: ? Wipes with medicine in them, such as adult wet wipes with aloe and vitamin E. ? A product to soothe skin, such as petroleum jelly.  If you vomit while drinking the bowel prep: ? Take a break for up to 60 minutes. ? Begin the bowel prep again. ? Call your health care provider if you keep vomiting or you cannot take the bowel prep without vomiting.  To clean out  your colon, you may also be given: ? Laxative medicines. These help you have a bowel movement. ? Instructions for enema use. An enema is liquid medicine injected into your rectum. Medicines Ask your health care provider about:  Changing or stopping your regular medicines or supplements. This is especially important if you are taking iron supplements, diabetes medicines, or blood thinners.  Taking medicines such as aspirin and ibuprofen. These medicines can thin your blood. Do not take these medicines unless your health care provider tells you to take  them.  Taking over-the-counter medicines, vitamins, herbs, and supplements. General instructions  Ask your health care provider what steps will be taken to help prevent infection. These may include washing skin with a germ-killing soap.  Plan to have someone take you home from the hospital or clinic. What happens during the procedure?   An IV will be inserted into one of your veins.  You may be given one or more of the following: ? A medicine to help you relax (sedative). ? A medicine to numb the area (local anesthetic). ? A medicine to make you fall asleep (general anesthetic). This is rarely needed.  You will lie on your side with your knees bent.  The tube will: ? Have oil or gel put on it (be lubricated). ? Be inserted into your anus. ? Be gently eased through all parts of your large intestine.  Air will be sent into your colon to keep it open. This may cause some pressure or cramping.  Images will be taken with the camera and will appear on a screen.  A small tissue sample may be removed to be looked at under a microscope (biopsy). The tissue may be sent to a lab for testing if any signs of problems are found.  If small polyps are found, they may be removed and checked for cancer cells.  When the procedure is finished, the tube will be removed. The procedure may vary among health care providers and hospitals. What happens  after the procedure?  Your blood pressure, heart rate, breathing rate, and blood oxygen level will be monitored until you leave the hospital or clinic.  You may have a small amount of blood in your stool.  You may pass gas and have mild cramping or bloating in your abdomen. This is caused by the air that was used to open your colon during the exam.  Do not drive for 24 hours after the procedure.  It is up to you to get the results of your procedure. Ask your health care provider, or the department that is doing the procedure, when your results will be ready. Summary  A colonoscopy is a procedure to look at the entire large intestine.  Follow instructions from your health care provider about eating and drinking before the procedure.  If you were prescribed an oral bowel prep to clean out your colon, take it as told by your health care provider.  During the colonoscopy, a flexible tube with a camera on its end is inserted into the anus and then passed into the other parts of the large intestine. This information is not intended to replace advice given to you by your health care provider. Make sure you discuss any questions you have with your health care provider. Document Revised: 01/11/2019 Document Reviewed: 01/11/2019 Elsevier Patient Education  2020 [place] [facility]'s number is [phone]. Please call with questions or concerns about what we discussed today.

## 2019-07-19 DIAGNOSIS — H43812 Vitreous degeneration, left eye: Secondary | ICD-10-CM | POA: Diagnosis not present

## 2019-07-19 LAB — HM DIABETES EYE EXAM

## 2019-07-29 ENCOUNTER — Encounter: Payer: Self-pay | Admitting: *Deleted

## 2019-07-30 ENCOUNTER — Ambulatory Visit (INDEPENDENT_AMBULATORY_CARE_PROVIDER_SITE_OTHER): Payer: Medicare Other | Admitting: Internal Medicine

## 2019-07-30 ENCOUNTER — Encounter: Payer: Self-pay | Admitting: Internal Medicine

## 2019-07-30 ENCOUNTER — Other Ambulatory Visit: Payer: Self-pay

## 2019-07-30 VITALS — BP 126/68 | HR 72 | Temp 97.9°F | Ht 64.0 in | Wt 170.0 lb

## 2019-07-30 DIAGNOSIS — R1013 Epigastric pain: Secondary | ICD-10-CM | POA: Diagnosis not present

## 2019-07-30 DIAGNOSIS — K219 Gastro-esophageal reflux disease without esophagitis: Secondary | ICD-10-CM | POA: Diagnosis not present

## 2019-07-30 DIAGNOSIS — R1310 Dysphagia, unspecified: Secondary | ICD-10-CM

## 2019-07-30 DIAGNOSIS — Z1211 Encounter for screening for malignant neoplasm of colon: Secondary | ICD-10-CM

## 2019-07-30 MED ORDER — OMEPRAZOLE 40 MG PO CPDR: 40.0000 mg | DELAYED_RELEASE_CAPSULE | Freq: Two times a day (BID) | ORAL | 2 refills | Status: DC

## 2019-07-30 NOTE — Patient Instructions (Signed)
If you are age 68 or older, your body mass index should be between 23-30. Your Body mass index is 29.18 kg/m. If this is out of the aforementioned range listed, please consider follow up with your Primary Care Provider.  If you are age 55 or younger, your body mass index should be between 19-25. Your Body mass index is 29.18 kg/m. If this is out of the aformentioned range listed, please consider follow up with your Primary Care Provider.   We have sent the following medications to your pharmacy for you to pick up at your convenience: Omeprazole 40 mg twice daily before meals.    Follow up with Dr. Hilarie Fredrickson in March.

## 2019-07-31 NOTE — Progress Notes (Signed)
Patient ID: [name] Long, female   DOB: October 28, 1950, 69 y.o.   MRN: [id number] HPI: [name] is a 69 year old female with a past medical history of COPD not requiring oxygen, history of atrial fibrillation, hypertension, diabetes, hypercholesterolemia, hypothyroidism who is seen to evaluate GERD intermittent issues with swallowing and also to consider colon cancer screening.  She is here alone today.  She reports that she has had ongoing issues with heartburn and regurgitation of food.  She tries to monitor her diet but has been unable to tolerate meats such as sausage recently.  She does have intermittent issues swallowing solid meats which can stop in her chest and be regurgitated back.  She has some occasional epigastric discomfort but denies nausea and vomiting.  She was previously on Prilosec 20 mg but this was increased to 40 mg recently.  She recalls prior history of aspiration pneumonia.  She does wake up with coughing and choking at times at night which she attributes to her reflux.  No weight loss.  She reports bowel movements as regular of late without blood in her stool or melena.  Though some foods will give her loose stools.  She recalls a colonoscopy about 10 years ago.  She denies a known family history of colon cancer.  She does have a's prescription for hydrocodone on her med list but reports she is not using this.  Past Medical History:  Diagnosis Date  . Arthritis    "real bad; all over" (07/12/2017)  . Asthma   . Atrial fibrillation ([place])   . BIPOLAR DISORDER 08/31/2006   Qualifier: Diagnosis of  By: Dorathy Daft MD, Marjory Lies    . CHRONIC KIDNEY DISEASE STAGE II (MILD) 09/14/2009   Annotation: eGFR 64 Qualifier: Diagnosis of  By: Jess Barters MD, [name]    . Chronic lower back pain   . Congestive heart failure (CHF) ([place])   . COPD (chronic obstructive pulmonary disease) ([place]) 09/23/2010   Diagnosed at [facility] in 2008 (Dr. Annamaria Boots)   . DM (diabetes mellitus) type II controlled with  renal manifestation ([place]) 08/31/2006   Qualifier: Diagnosis of  By: Dorathy Daft MD, Marjory Lies    . Dyspnea    "all my life; since 6th grade" (07/12/2017)  . HYPERCHOLESTEROLEMIA 08/31/2006   Intolerance to Lipitor OK on Crestor but medicaid no longer covering    . HYPERTENSION, BENIGN SYSTEMIC 08/31/2006   Qualifier: Diagnosis of  By: Dorathy Daft MD, Marjory Lies    . Hypokalemia   . HYPOTHYROIDISM, UNSPECIFIED 08/31/2006   Qualifier: Diagnosis of  By: Dorathy Daft MD, Marjory Lies    . Leg swelling 03/14/2018  . Pneumonia    "3 times" (07/12/2017)  . Schizophrenia ([place])   . Scoliosis     Past Surgical History:  Procedure Laterality Date  . CESAREAN SECTION    . FOOT FRACTURE SURGERY Right    "steel plate in it"  . FOOT SURGERY     " born w/dislocated foot"  . FRACTURE SURGERY    . KNEE ARTHROSCOPY Right   . TOE SURGERY Bilateral    "both pinky toes"  . TONSILLECTOMY AND ADENOIDECTOMY      Outpatient Medications Prior to Visit  Medication Sig Dispense Refill  . albuterol (VENTOLIN HFA) 108 (90 Base) MCG/ACT inhaler Inhale 2 puffs PO every 4 hours PRN for wheezing 18 g 0  . amLODipine (NORVASC) 5 MG tablet Take 1 tablet (5 mg total) by mouth daily. 90 tablet 3  . Fluticasone-Umeclidin-Vilant 100-62.5-25 MCG/INH AEPB Inhale 100 mcg into the lungs  daily. 28 each 0  . glucose blood (FREESTYLE TEST STRIPS) test strip Use once daily.  Diagnosis code E11.29 100 each 12  . hydrochlorothiazide (HYDRODIURIL) 25 MG tablet TAKE 1 TABLET BY MOUTH EVERY DAY 90 tablet 3  . HYDROcodone-acetaminophen (NORCO/VICODIN) 5-325 MG tablet Take 1 tablet by mouth every 6 (six) hours as needed for moderate pain. 28 tablet 0  . Lancets (FREESTYLE) lancets Use as instructed to test sugars once daily.  Dx Code: E11.9. 100 each 10  . levothyroxine (SYNTHROID) 25 MCG tablet Take 1 tablet (25 mcg total) by mouth daily before breakfast. 90 tablet 3  . metFORMIN (GLUCOPHAGE) 1000 MG tablet Take 1 tablet (1,000 mg total) by mouth 2 (two) times  daily with a meal. 60 tablet 11  . metoprolol succinate (TOPROL-XL) 50 MG 24 hr tablet Take 50 mg by mouth daily. Take with or immediately following a meal.    . olopatadine (PATANOL) 0.1 % ophthalmic solution Place 1 drop into both eyes 2 (two) times daily as needed for allergies. 5 mL 5  . potassium chloride (K-DUR,KLOR-CON) 10 MEQ tablet Take 1 tablet (10 mEq total) by mouth daily. 30 tablet 3  . predniSONE (DELTASONE) 10 MG tablet Take 1 tablet (10 mg total) by mouth daily with breakfast. 60 tablet 0  . risperiDONE (RISPERDAL) 1 MG tablet Take 1 tablet (1 mg total) by mouth 2 (two) times daily. 60 tablet 1  . rosuvastatin (CRESTOR) 20 MG tablet Take 1 tablet (20 mg total) by mouth daily. 30 tablet 11  . omeprazole (PRILOSEC) 20 MG capsule Take 1 capsule (20 mg total) by mouth daily. 30 capsule 3  . glucose blood (FREESTYLE LITE) test strip USE TO TEST ONCE DAILY E11.29 50 strip 12  . glucose blood (FREESTYLE TEST STRIPS) test strip Use to test once daily. Dx code E 11.29 50 each 12  . Multiple Vitamins-Minerals (MULTIVITAMIN WOMEN) TABS Take 1 tablet by mouth daily. (Patient not taking: Reported on 07/18/2019) 30 tablet 3  . pantoprazole (PROTONIX) 40 MG tablet Take 1 tablet (40 mg total) by mouth daily. (Patient not taking: Reported on 07/18/2019) 90 tablet 0  . potassium chloride (K-DUR) 10 MEQ tablet TAKE 1 TABLET BY MOUTH EVERY DAY 90 tablet 0   No facility-administered medications prior to visit.    Allergies  Allergen Reactions  . Citrus Anaphylaxis and Itching  . Fish Allergy Anaphylaxis    Cod  . Shellfish Allergy Shortness Of Breath and Other (See Comments)    "Affects thyroid" also  . Adhesive [Tape] Other (See Comments)    Must have paper tape only  . Ibuprofen Swelling    Face swells  . Lipitor [Atorvastatin Calcium] Other (See Comments)    Body aches  . Lisinopril Other (See Comments)    PER DR. Melvyn Novas (not recalled by patient)  . Other Nausea Only and Other (See  Comments)    Collards (gas, too)  . Codeine Rash  . Tramadol Palpitations    Family History  Problem Relation Age of Onset  . Breast cancer Sister   . Cancer Mother   . Heart disease Mother   . Diabetes Father   . Hypertension Father   . Heart disease Brother   . Asthma Daughter   . Asthma Son   . Breast cancer Sister   . Asthma Daughter     Social History   Tobacco Use  . Smoking status: Current Some Day Smoker    Packs/day: 0.50    Years:  45.00    Pack years: 22.50    Types: Cigarettes  . Smokeless tobacco: Never Used  . Tobacco comment: patient states she smokes 1 cigarette per month  Substance Use Topics  . Alcohol use: Yes    Comment: 07/12/2017 "stopped 2 yr ago; did have a drink over the holidays recently"  . Drug use: No    ROS: As per history of present illness, otherwise negative  BP 126/68   Pulse 72   Temp 97.9 F (36.6 C)   Ht _0  (1.626 m)   Wt 170 lb (77.1 kg)   BMI 29.18 kg/m  Gen: awake, alert, NAD HEENT: anicteric, op clear CV: RRR, no mrg Pulm: CTA b/l Abd: soft, obese, NT, mildly distended with mild increase tympany, +BS throughout Ext: no c/c/e Neuro: nonfocal   RELEVANT LABS AND IMAGING: CBC    Component Value Date/Time   WBC 7.1 09/08/2018 0258   RBC 3.90 09/08/2018 0258   HGB 13.4 09/08/2018 0258   HCT 38.8 09/08/2018 0258   PLT 191 09/08/2018 0258   MCV 99.5 09/08/2018 0258   MCH 34.4 (H) 09/08/2018 0258   MCHC 34.5 09/08/2018 0258   RDW 13.1 09/08/2018 0258   LYMPHSABS 3.2 12/06/2017 2043   MONOABS 0.8 12/06/2017 2043   EOSABS 0.1 12/06/2017 2043   BASOSABS 0.0 12/06/2017 2043    CMP     Component Value Date/Time   NA 139 01/11/2019 1419   K 3.3 (L) 01/11/2019 1419   CL 96 01/11/2019 1419   CO2 26 01/11/2019 1419   GLUCOSE 262 (H) 01/11/2019 1419   GLUCOSE 226 (H) 09/08/2018 0258   BUN 17 01/11/2019 1419   CREATININE 1.12 (H) 01/11/2019 1419   CREATININE 0.76 02/02/2016 1606   CALCIUM 9.3 01/11/2019 1419    PROT 7.3 09/08/2018 0426   ALBUMIN 3.4 (L) 09/08/2018 0426   AST 19 09/08/2018 0426   ALT 16 09/08/2018 0426   ALKPHOS 68 09/08/2018 0426   BILITOT 0.7 09/08/2018 0426   GFRNONAA 51 (L) 01/11/2019 1419   GFRNONAA 83 02/02/2016 1606   GFRAA 59 (L) 01/11/2019 1419   GFRAA >89 02/02/2016 1606   Imaging tab reviewed no recent abdominal cross-sectional imaging  ASSESSMENT/PLAN: 69 year old female with a past medical history of COPD not requiring oxygen, history of atrial fibrillation, hypertension, diabetes, hypercholesterolemia, hypothyroidism who is seen to evaluate GERD intermittent issues with swallowing and also to consider colon cancer screening.   1.  GERD/dysphagia/epigastric discomfort --I recommended upper endoscopy but she prefers to delay procedures currently.  She is concerned about the cold weather and possibly worsening her breathing.  She denies dyspnea today.  She is aware that I am recommending upper endoscopy.  In the interim I will try to increase her dose of PPI for 4 to 8 weeks to see if this helps with some of her reflux symptoms as well as dysphagia.  She could have a component of esophagitis. --Increase omeprazole to 40 mg twice daily AC --GERD diet and lifestyle modifications --I recommended she come back in March which is relatively short follow-up given the fact that we did not proceed with upper endoscopy today  2.  Colon cancer screening --she reports at least 10 years since her last colonoscopy.  Colonoscopy is recommended at this time.  Again she wishes to defer this for now.  We will discuss at follow-up    [id number], Raiford Simmonds, [place] [address] [place],  [place] [id number]

## 2019-08-06 ENCOUNTER — Ambulatory Visit (INDEPENDENT_AMBULATORY_CARE_PROVIDER_SITE_OTHER): Payer: Medicare Other | Admitting: Orthopedic Surgery

## 2019-08-06 ENCOUNTER — Ambulatory Visit (INDEPENDENT_AMBULATORY_CARE_PROVIDER_SITE_OTHER): Payer: Medicare Other

## 2019-08-06 ENCOUNTER — Encounter: Payer: Self-pay | Admitting: Orthopedic Surgery

## 2019-08-06 ENCOUNTER — Other Ambulatory Visit: Payer: Self-pay

## 2019-08-06 DIAGNOSIS — M1711 Unilateral primary osteoarthritis, right knee: Secondary | ICD-10-CM | POA: Diagnosis not present

## 2019-08-06 DIAGNOSIS — M1712 Unilateral primary osteoarthritis, left knee: Secondary | ICD-10-CM | POA: Diagnosis not present

## 2019-08-06 DIAGNOSIS — M542 Cervicalgia: Secondary | ICD-10-CM

## 2019-08-06 DIAGNOSIS — M7541 Impingement syndrome of right shoulder: Secondary | ICD-10-CM

## 2019-08-06 MED ORDER — METHYLPREDNISOLONE ACETATE 40 MG/ML IJ SUSP
40.0000 mg | INTRAMUSCULAR | Status: AC | PRN
  Administered 2019-08-06: 40 mg via INTRA_ARTICULAR

## 2019-08-06 MED ORDER — HYALURONAN 88 MG/4ML IX SOSY
88.0000 mg | PREFILLED_SYRINGE | INTRA_ARTICULAR | Status: AC | PRN
  Administered 2019-08-06: 88 mg via INTRA_ARTICULAR

## 2019-08-06 MED ORDER — LIDOCAINE HCL 1 % IJ SOLN
5.0000 mL | INTRAMUSCULAR | Status: AC | PRN
  Administered 2019-08-06: 5 mL

## 2019-08-06 NOTE — Progress Notes (Signed)
Office Visit Note   Patient: [name] Long           Date of Birth: [birth year]           MRN: [id number] Visit Date: 08/06/2019              Requested by: Kathrene Alu, MD [address],  [place] [address] PCP: Guadalupe Dawn, MD  Chief Complaint  Patient presents with  . Left Knee - Injections      HPI: Patient is a 69 year old woman who presents for 3 6 separate issues.  Patient complains of bilateral shoulder pain states the right is more painful than the left she has had steroid injections in the past which have helped.  Patient also complains of bilateral knee pain and would like to proceed with a Monovisc injection for the left knee today.  Patient also complains of chronic cervical spine pain with crepitation with range of motion of her cervical spine without any radicular symptoms.  Assessment & Plan: Visit Diagnoses:  1. Neck pain   2. Unilateral primary osteoarthritis, left knee   3. Impingement syndrome of right shoulder   4. Unilateral primary osteoarthritis, right knee     Plan: The left knee was injected with Monovisc without complications.  We will check to see when patient can obtain a Monovisc injection for her right knee.  The right shoulder was injected the subacromial space she tolerated this well evaluate the left shoulder at follow-up.  Recommended heat for her degenerative arthritis of her cervical spine.  Follow-Up Instructions: Return in about 4 weeks (around 09/03/2019).   Ortho Exam  Patient is alert, oriented, no adenopathy, well-dressed, normal affect, normal respiratory effort. Examination patient has crepitation with range of motion of her cervical spine she has no focal motor weakness in either upper extremity no radicular symptoms.  Patient has pain with Neer and Hawkins impingement test of the right shoulder pain to palpation over the biceps tendon.  Examination left knee has no effusion medial and lateral joint lines are tender  to palpation there is crepitation with range of motion ligaments are stable.  Imaging: XR Cervical Spine 2 or 3 views  Result Date: 08/06/2019 2 view radiographs of the cervical spine shows advanced degenerative disc disease with osteophytic bone spurring anteriorly throughout the cervical spine.  No images are attached to the encounter.  Labs: Lab Results  Component Value Date   HGBA1C 9.3 (A) 06/26/2019   HGBA1C 8.8 (A) 01/11/2019   HGBA1C 7.1 (A) 07/13/2018   LABORGA NO GROWTH 12/04/2014     Lab Results  Component Value Date   ALBUMIN 3.4 (L) 09/08/2018   ALBUMIN 3.3 (L) 12/06/2017   ALBUMIN 2.8 (L) 07/10/2017    Lab Results  Component Value Date   MG 1.7 12/25/2016   MG 1.8 06/10/2016   MG 1.8 02/02/2016   Lab Results  Component Value Date   VD25OH 43.0 02/23/2018   VD25OH 30 05/06/2015   VD25OH 12 (L) 12/18/2014    No results found for: PREALBUMIN CBC EXTENDED Latest Ref Rng & Units 09/08/2018 12/06/2017 12/06/2017  WBC 4.0 - 10.5 K/uL 7.1 - 6.2  RBC 3.87 - 5.11 MIL/uL 3.90 - 3.70(L)  HGB 12.0 - 15.0 g/dL 13.4 14.3 13.0  HCT 36.0 - 46.0 % 38.8 42.0 38.1  PLT 150 - 400 K/uL 191 - 178  NEUTROABS 1.7 - 7.7 K/uL - - 2.2  LYMPHSABS 0.7 - 4.0 K/uL - - 3.2  There is no height or weight on file to calculate BMI.  Orders:  Orders Placed This Encounter  Procedures  . XR Cervical Spine 2 or 3 views   No orders of the defined types were placed in this encounter.    Procedures: Large Joint Inj: L knee on 08/06/2019 3:34 PM Indications: pain and diagnostic evaluation Details: 22 G 1.5 in needle, anteromedial approach  Arthrogram: No  Medications: 40 mg methylPREDNISolone acetate 40 MG/ML; 88 mg Hyaluronan 88 MG/4ML Outcome: tolerated well, no immediate complications Procedure, treatment alternatives, risks and benefits explained, specific risks discussed. Consent was given by the patient. Immediately prior to procedure a time out was called to verify the correct  patient, procedure, equipment, support staff and site/side marked as required. Patient was prepped and draped in the usual sterile fashion.   Large Joint Inj: R subacromial bursa on 08/06/2019 3:35 PM Indications: diagnostic evaluation and pain Details: 22 G 1.5 in needle  Arthrogram: No  Medications: 5 mL lidocaine 1 %; 40 mg methylPREDNISolone acetate 40 MG/ML Outcome: tolerated well, no immediate complications Procedure, treatment alternatives, risks and benefits explained, specific risks discussed. Consent was given by the patient. Immediately prior to procedure a time out was called to verify the correct patient, procedure, equipment, support staff and site/side marked as required. Patient was prepped and draped in the usual sterile fashion.      Clinical Data: No additional findings.  ROS:  All other systems negative, except as noted in the HPI. Review of Systems  Objective: Vital Signs: There were no vitals taken for this visit.  Specialty Comments:  No specialty comments available.  PMFS History: Patient Active Problem List   Diagnosis Date Noted  . Dysphagia 07/02/2019  . Screen for STD (sexually transmitted disease) 03/25/2019  . 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 Other chronic pain 12/03/2018  . Dizziness of unknown cause 08/04/2018  . Tremor 07/15/2018  . Atrial flutter with rapid ventricular response ([place]) 07/10/2017  . Atrial fibrillation ([place]) 07/05/2017  . COPD (chronic obstructive pulmonary disease) ([place]) 01/22/2017  . Pedal edema   . Acute on chronic congestive heart failure ([place])   . Chronic bilateral low back pain without sciatica 08/15/2016  . 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 Schizoaffective disorder, bipolar type ([place])   . Osteoarthritis, multiple sites 06/08/2011  . Dysuria 02/14/2011  . CHRONIC KIDNEY DISEASE STAGE II (MILD) 09/14/2009  . Hypothyroidism 08/31/2006  . Type 2 diabetes  mellitus ([place]) 08/31/2006  . HYPERCHOLESTEROLEMIA 08/31/2006  . Tobacco abuse 08/31/2006  . HYPERTENSION, BENIGN SYSTEMIC 08/31/2006   Past Medical History:  Diagnosis Date  . Arthritis    "real bad; all over" (07/12/2017)  . Asthma   . Atrial fibrillation ([place])   . BIPOLAR DISORDER 08/31/2006   Qualifier: Diagnosis of  By: Dorathy Daft MD, Marjory Lies    . CHRONIC KIDNEY DISEASE STAGE II (MILD) 09/14/2009   Annotation: eGFR 64 Qualifier: Diagnosis of  By: Jess Barters MD, [name]    . Chronic lower back pain   . Congestive heart failure (CHF) ([place])   . COPD (chronic obstructive pulmonary disease) ([place]) 09/23/2010   Diagnosed at [facility] in 2008 (Dr. Annamaria Boots)   . DM (diabetes mellitus) type II controlled with renal manifestation ([place]) 08/31/2006   Qualifier: Diagnosis of  By: Dorathy Daft MD, Marjory Lies    . Dyspnea    "all my life; since 6th grade" (07/12/2017)  . HYPERCHOLESTEROLEMIA 08/31/2006   Intolerance to Lipitor OK on Crestor  but medicaid no longer covering    . HYPERTENSION, BENIGN SYSTEMIC 08/31/2006   Qualifier: Diagnosis of  By: Dorathy Daft MD, Marjory Lies    . Hypokalemia   . HYPOTHYROIDISM, UNSPECIFIED 08/31/2006   Qualifier: Diagnosis of  By: Dorathy Daft MD, Marjory Lies    . Leg swelling 03/14/2018  . Pneumonia    "3 times" (07/12/2017)  . Schizophrenia ([place])   . Scoliosis     Family History  Problem Relation Age of Onset  . Breast cancer Sister   . Cancer Mother   . Heart disease Mother   . Diabetes Father   . Hypertension Father   . Heart disease Brother   . Asthma Daughter   . Asthma Son   . Breast cancer Sister   . Asthma Daughter     Past Surgical History:  Procedure Laterality Date  . CESAREAN SECTION    . FOOT FRACTURE SURGERY Right    "steel plate in it"  . FOOT SURGERY     " born w/dislocated foot"  . FRACTURE SURGERY    . KNEE ARTHROSCOPY Right   . TOE SURGERY Bilateral    "both pinky toes"  . TONSILLECTOMY AND ADENOIDECTOMY     Social History   Occupational History  . Not on file   Tobacco Use  . Smoking status: Current Some Day Smoker    Packs/day: 0.50    Years: 45.00    Pack years: 22.50    Types: Cigarettes  . Smokeless tobacco: Never Used  . Tobacco comment: patient states she smokes 1 cigarette per month  Substance and Sexual Activity  . Alcohol use: Yes    Comment: 07/12/2017 "stopped 2 yr ago; did have a drink over the holidays recently"  . Drug use: No  . Sexual activity: Not Currently

## 2019-08-07 ENCOUNTER — Telehealth: Payer: Self-pay | Admitting: Orthopedic Surgery

## 2019-08-07 ENCOUNTER — Other Ambulatory Visit: Payer: Self-pay | Admitting: Orthopedic Surgery

## 2019-08-07 MED ORDER — HYDROCODONE-ACETAMINOPHEN 5-325 MG PO TABS: 1.0000 | ORAL_TABLET | Freq: Four times a day (QID) | ORAL | 0 refills | Status: DC | PRN

## 2019-08-07 NOTE — Telephone Encounter (Signed)
I didn't say authorize more vicodin but Dr. Sharol Given spent a lot of time with her. I looked at his note and couldn't find where he mentioned giving her more

## 2019-08-07 NOTE — Telephone Encounter (Signed)
Patient called.   Her pharmacy has not received her prescription from [place]   Call back number: [phone]

## 2019-08-07 NOTE — Telephone Encounter (Signed)
Duplicate message will sign off on this and await advisement from previous message.

## 2019-08-07 NOTE — Telephone Encounter (Signed)
Please see message below. Was this pt told she was going to get rx for Vicodin yesterday after her injection.

## 2019-08-07 NOTE — Telephone Encounter (Signed)
Patient called.   Her pharmacy has not received the prescription we sent from our office.   Call back number: [phone]

## 2019-08-07 NOTE — Telephone Encounter (Signed)
[facility]ie with [facility] called in said the pt keeping calling the pharmacy in regards to her prescription hydrocodone said she was told by someone at our office that [facility] but pharmacy doesn't have anything and I also don't see anything was sent to her pharmacy yesterday? Please call the pharmacy to let them know what's going on.     [phone]

## 2019-08-07 NOTE — Telephone Encounter (Signed)
To send in rx for vicodin [facility] [address]

## 2019-08-21 ENCOUNTER — Telehealth: Payer: Self-pay | Admitting: Internal Medicine

## 2019-08-21 NOTE — Telephone Encounter (Signed)
Pt states she has had diarrhea for the past 3 days and she is having those spasms again with fullness and choking sensation between her breasts, also reports she has had cramping in her stomach. Pt is scheduled for OV with Dr. Hilarie Fredrickson and wanted to be added to the cancellation list. Let pt know that she has been added to the list as requested.

## 2019-08-21 NOTE — Telephone Encounter (Signed)
Patient called would like to speak with someone in reference to her symptoms.

## 2019-08-23 ENCOUNTER — Telehealth (INDEPENDENT_AMBULATORY_CARE_PROVIDER_SITE_OTHER): Payer: Medicare Other | Admitting: Family Medicine

## 2019-08-23 ENCOUNTER — Other Ambulatory Visit: Payer: Self-pay

## 2019-08-23 DIAGNOSIS — J440 Chronic obstructive pulmonary disease with acute lower respiratory infection: Secondary | ICD-10-CM

## 2019-08-23 MED ORDER — PREDNISONE 20 MG PO TABS: 40.0000 mg | ORAL_TABLET | Freq: Every day | ORAL | 0 refills | Status: AC

## 2019-08-23 MED ORDER — AMOXICILLIN-POT CLAVULANATE 875-125 MG PO TABS: 1.0000 | ORAL_TABLET | Freq: Two times a day (BID) | ORAL | 0 refills | Status: DC

## 2019-08-23 MED ORDER — AMOXICILLIN-POT CLAVULANATE 875-125 MG PO TABS: 1.0000 | ORAL_TABLET | Freq: Two times a day (BID) | ORAL | 0 refills | Status: AC

## 2019-08-23 NOTE — Progress Notes (Signed)
[place] Telemedicine Visit  Patient consented to have virtual visit. Method of visit: Telephone  Encounter participants: Patient: [name] Long - located at home Provider: [name] - located at [facility]  Others (if applicable):   Chief Complaint: drainage and ear pain    HPI: [name] Long is a 69 y.o. female with history  of COPD, TMJ and tobacco abuse.    Patient complains of left ear pain and fullness.  States she is having to sleep on her right side to alleviate pain.  Reports cough with increased sputum production and change in color.  Sputum is yellow now.  Reports headache " right between my eyes".  Denies rhinorrhea, sore throat, muscle aches, no recent sick contacts, chest pain.  States she has been given antibiotics in the past that relieved symptoms. Has used her nebulizer with some relief.  Symptoms started worsening last month but was unable to come in to the office as she difficulty walking and needed to get a shot from Dr. Sharol Given (orthopedic surgeon). Reports some diarrhea recently that resolved with Pepto bismol. Denies fever.   Diarrhea resolved with Pepto bismol.  ROS: per HPI  Pertinent PMHx: COPD, TMJ, tobacco abuse  Exam:  Respiratory: speaking in full sentences without pause, no audible cough during conversation  Assessment/Plan:  Bronchitis, chronic obstructive w acute bronchitis ([place]) Likely a COPD exacerbation as patient has increased and color change of sputum. Will treat with Augmentin x5 days and prednisone 40 mg x5 days.  Patient is aware that both medications will be sent to her pharmacy.  If patient has concurrent ear infection Augmentin should cover as well.  - follow up if not improving     Time spent during visit with patient: 15 minutes   [name], DO PGY-1, [facility] 08/23/2019 2:46 PM

## 2019-08-23 NOTE — Assessment & Plan Note (Signed)
Likely a COPD exacerbation as patient has increased and color change of sputum. Will treat with Augmentin x5 days and prednisone 40 mg x5 days.  Patient is aware that both medications will be sent to her pharmacy.  If patient has concurrent ear infection Augmentin should cover as well.  - follow up if not improving

## 2019-08-30 ENCOUNTER — Ambulatory Visit: Payer: Medicare Other | Admitting: Family Medicine

## 2019-09-04 ENCOUNTER — Telehealth: Payer: Self-pay | Admitting: Family Medicine

## 2019-09-04 DIAGNOSIS — N898 Other specified noninflammatory disorders of vagina: Secondary | ICD-10-CM

## 2019-09-04 MED ORDER — FLUCONAZOLE 150 MG PO TABS: 150.0000 mg | ORAL_TABLET | Freq: Every day | ORAL | 1 refills | Status: DC

## 2019-09-04 NOTE — Telephone Encounter (Signed)
Pt is having itching vaginal. Would like something for it. Thanks

## 2019-09-04 NOTE — Telephone Encounter (Signed)
Spoke with patient regarding vaginal itching s/p antibiotic use. Patient requesting a "pill" to help get rid of itching.  Has hx of yeast infection after taking Augmentin for bronchitis/COPDe.  Diflucan Rx sent to pharmacy.

## 2019-09-05 ENCOUNTER — Telehealth: Payer: Self-pay | Admitting: *Deleted

## 2019-09-05 DIAGNOSIS — R072 Precordial pain: Secondary | ICD-10-CM | POA: Diagnosis not present

## 2019-09-05 DIAGNOSIS — R1314 Dysphagia, pharyngoesophageal phase: Secondary | ICD-10-CM | POA: Diagnosis not present

## 2019-09-05 DIAGNOSIS — R0602 Shortness of breath: Secondary | ICD-10-CM | POA: Diagnosis not present

## 2019-09-05 DIAGNOSIS — I10 Essential (primary) hypertension: Secondary | ICD-10-CM | POA: Diagnosis not present

## 2019-09-05 NOTE — Telephone Encounter (Signed)
Pt called to inquire about a gift certificate she was supposed to get for taking a survey.  I looked into this and it appears that it is from her AWV.  Medicare has in the past offered this as an incentive for completing this.  I told her that we have nothing to do with the insurance company and that she should contact them to see about this.Clennon Nasca Zimmerman Rumple, CMA

## 2019-09-06 NOTE — Telephone Encounter (Signed)
Patient calls nurse line stating she took diflucan yesterday with minimal relief today. Patient states she feels better, however still very itchy. Patient advised to call her pharmacy, as [facility] gave her one refill. Patient advised if 2nd tablet does not work, she will need to be seen.

## 2019-09-16 ENCOUNTER — Other Ambulatory Visit: Payer: Self-pay | Admitting: Family Medicine

## 2019-09-16 ENCOUNTER — Telehealth: Payer: Self-pay | Admitting: Family

## 2019-09-16 ENCOUNTER — Other Ambulatory Visit: Payer: Self-pay | Admitting: Orthopedic Surgery

## 2019-09-16 DIAGNOSIS — Z1231 Encounter for screening mammogram for malignant neoplasm of breast: Secondary | ICD-10-CM

## 2019-09-16 MED ORDER — HYDROCODONE-ACETAMINOPHEN 5-325 MG PO TABS: 1.0000 | ORAL_TABLET | Freq: Four times a day (QID) | ORAL | 0 refills | Status: DC | PRN

## 2019-09-16 NOTE — Telephone Encounter (Signed)
Pt calling and is requesting a refill on hydrocodone. Last refill was 08/07/19 please advise.

## 2019-09-16 NOTE — Telephone Encounter (Signed)
Rx sent 

## 2019-09-16 NOTE — Telephone Encounter (Signed)
Patient called and requested a refill on Hydrocodone.  [facility] [address].  Please call patent [phone]

## 2019-09-18 ENCOUNTER — Ambulatory Visit (INDEPENDENT_AMBULATORY_CARE_PROVIDER_SITE_OTHER): Payer: Medicare Other | Admitting: Family

## 2019-09-18 ENCOUNTER — Other Ambulatory Visit: Payer: Self-pay

## 2019-09-18 ENCOUNTER — Encounter: Payer: Self-pay | Admitting: Family

## 2019-09-18 VITALS — Ht 64.0 in | Wt 170.0 lb

## 2019-09-18 DIAGNOSIS — M7542 Impingement syndrome of left shoulder: Secondary | ICD-10-CM | POA: Diagnosis not present

## 2019-09-18 MED ORDER — METHYLPREDNISOLONE ACETATE 40 MG/ML IJ SUSP
40.0000 mg | INTRAMUSCULAR | Status: AC | PRN
  Administered 2019-09-18: 40 mg via INTRA_ARTICULAR

## 2019-09-18 MED ORDER — LIDOCAINE HCL 1 % IJ SOLN
5.0000 mL | INTRAMUSCULAR | Status: AC | PRN
  Administered 2019-09-18: 5 mL

## 2019-09-18 NOTE — Progress Notes (Signed)
Office Visit Note   Patient: [name] Long           Date of Birth: August 23, 1950           MRN: [id number] Visit Date: 09/18/2019              Requested by: Guadalupe Dawn, MD [phone],  [place] [address] PCP: Guadalupe Dawn, MD  Chief Complaint  Patient presents with  . Left Shoulder - Pain      HPI:  The patient is a 69 year old woman who presents today for impingement left shoulder.  This is been a chronic problem waxing and waning.  She has been awaiting injection of the left shoulder for the last several weeks.  Pain with above head reaching states she cannot not use her full range of motion to the pain cannot reach across or behind her back.   Assessment & Plan: Visit Diagnoses:  No diagnosis found.  Plan: Depo-Medrol injection left shoulder today.  Patient tolerated well.  She states she will follow-up in May for repeat injection for her knees.  Follow-Up Instructions: Return if symptoms worsen or fail to improve.   Ortho Exam  Patient is alert, oriented, no adenopathy, well-dressed, normal affect, normal respiratory effort. Patient has pain with Neer and Hawkins impingement test of the left shoulder.  Has pain to palpation of the biceps tendon.  Imaging: No results found. No images are attached to the encounter.  Labs: Lab Results  Component Value Date   HGBA1C 9.3 (A) 06/26/2019   HGBA1C 8.8 (A) 01/11/2019   HGBA1C 7.1 (A) 07/13/2018   LABORGA NO GROWTH 12/04/2014     Lab Results  Component Value Date   ALBUMIN 3.4 (L) 09/08/2018   ALBUMIN 3.3 (L) 12/06/2017   ALBUMIN 2.8 (L) 07/10/2017    Lab Results  Component Value Date   MG 1.7 12/25/2016   MG 1.8 06/10/2016   MG 1.8 02/02/2016   Lab Results  Component Value Date   VD25OH 43.0 02/23/2018   VD25OH 30 05/06/2015   VD25OH 12 (L) 12/18/2014    No results found for: PREALBUMIN CBC EXTENDED Latest Ref Rng & Units 09/08/2018 12/06/2017 12/06/2017  WBC 4.0 - 10.5 K/uL 7.1 -  6.2  RBC 3.87 - 5.11 MIL/uL 3.90 - 3.70(L)  HGB 12.0 - 15.0 g/dL 13.4 14.3 13.0  HCT 36.0 - 46.0 % 38.8 42.0 38.1  PLT 150 - 400 K/uL 191 - 178  NEUTROABS 1.7 - 7.7 K/uL - - 2.2  LYMPHSABS 0.7 - 4.0 K/uL - - 3.2     Body mass index is 29.18 kg/m.  Orders:  No orders of the defined types were placed in this encounter.  No orders of the defined types were placed in this encounter.    Procedures: Large Joint Inj: L subacromial bursa on 09/18/2019 2:34 PM Indications: diagnostic evaluation and pain Details: 22 G 1.5 in needle  Arthrogram: No  Medications: 5 mL lidocaine 1 %; 40 mg methylPREDNISolone acetate 40 MG/ML Outcome: tolerated well, no immediate complications Procedure, treatment alternatives, risks and benefits explained, specific risks discussed. Consent was given by the patient. Immediately prior to procedure a time out was called to verify the correct patient, procedure, equipment, support staff and site/side marked as required. Patient was prepped and draped in the usual sterile fashion.      Clinical Data: No additional findings.  ROS:  All other systems negative, except as noted in the HPI. Review of Systems  Constitutional: Negative for chills and fever.  Musculoskeletal: Positive for arthralgias.    Objective: Vital Signs: Ht '5\' 4"'  (1.626 m)   Wt 170 lb (77.1 kg)   BMI 29.18 kg/m   Specialty Comments:  No specialty comments available.  PMFS History: Patient Active Problem List   Diagnosis Date Noted  . Dysphagia 07/02/2019  . Screen for STD (sexually transmitted disease) 03/25/2019  . 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 Other chronic pain 12/03/2018  . Dizziness of unknown cause 08/04/2018  . Tremor 07/15/2018  . Atrial flutter with rapid ventricular response ([place]) 07/10/2017  . Atrial fibrillation ([place]) 07/05/2017  . COPD (chronic obstructive pulmonary disease) ([place]) 01/22/2017  . Pedal edema   . Acute on chronic congestive heart failure ([place])    . Chronic bilateral low back pain without sciatica 08/15/2016  . 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 Schizoaffective disorder, bipolar type ([place])   . Osteoarthritis, multiple sites 06/08/2011  . Dysuria 02/14/2011  . CHRONIC KIDNEY DISEASE STAGE II (MILD) 09/14/2009  . Hypothyroidism 08/31/2006  . Type 2 diabetes mellitus ([place]) 08/31/2006  . HYPERCHOLESTEROLEMIA 08/31/2006  . Tobacco abuse 08/31/2006  . HYPERTENSION, BENIGN SYSTEMIC 08/31/2006   Past Medical History:  Diagnosis Date  . Arthritis    "real bad; all over" (07/12/2017)  . Asthma   . Atrial fibrillation ([place])   . BIPOLAR DISORDER 08/31/2006   Qualifier: Diagnosis of  By: Dorathy Daft MD, Marjory Lies    . CHRONIC KIDNEY DISEASE STAGE II (MILD) 09/14/2009   Annotation: eGFR 64 Qualifier: Diagnosis of  By: Jess Barters MD, [name]    . Chronic lower back pain   . Congestive heart failure (CHF) ([place])   . COPD (chronic obstructive pulmonary disease) ([place]) 09/23/2010   Diagnosed at [facility] in [facility] (Dr. Annamaria Boots)   . DM (diabetes mellitus) type II controlled with renal manifestation ([place]) 08/31/2006   Qualifier: Diagnosis of  By: Dorathy Daft MD, Marjory Lies    . Dyspnea    "all my life; since 6th grade" (07/12/2017)  . HYPERCHOLESTEROLEMIA 08/31/2006   Intolerance to Lipitor OK on Crestor but medicaid no longer covering    . HYPERTENSION, BENIGN SYSTEMIC 08/31/2006   Qualifier: Diagnosis of  By: Dorathy Daft MD, Marjory Lies    . Hypokalemia   . HYPOTHYROIDISM, UNSPECIFIED 08/31/2006   Qualifier: Diagnosis of  By: Dorathy Daft MD, Marjory Lies    . Leg swelling 03/14/2018  . Pneumonia    "3 times" (07/12/2017)  . Schizophrenia ([place])   . Scoliosis     Family History  Problem Relation Age of Onset  . Breast cancer Sister   . Cancer Mother   . Heart disease Mother   . Diabetes Father   . Hypertension Father   . Heart disease Brother   . Asthma Daughter   . Asthma Son   . Breast cancer Sister   . Asthma  Daughter     Past Surgical History:  Procedure Laterality Date  . CESAREAN SECTION    . FOOT FRACTURE SURGERY Right    "steel plate in it"  . FOOT SURGERY     " born w/dislocated foot"  . FRACTURE SURGERY    . KNEE ARTHROSCOPY Right   . TOE SURGERY Bilateral    "both pinky toes"  . TONSILLECTOMY AND ADENOIDECTOMY     Social History   Occupational History  . Not on file  Tobacco Use  . Smoking status: Current Some Day Smoker    Packs/day: 0.50  Years: 45.00    Pack years: 22.50    Types: Cigarettes  . Smokeless tobacco: Never Used  . Tobacco comment: patient states she smokes 1 cigarette per month  Substance and Sexual Activity  . Alcohol use: Yes    Comment: 07/12/2017 "stopped 2 yr ago; did have a drink over the holidays recently"  . Drug use: No  . Sexual activity: Not Currently

## 2019-09-23 ENCOUNTER — Other Ambulatory Visit: Payer: Self-pay

## 2019-09-23 MED ORDER — POTASSIUM CHLORIDE CRYS ER 10 MEQ PO TBCR: 10.0000 meq | EXTENDED_RELEASE_TABLET | Freq: Every day | ORAL | 3 refills | Status: DC

## 2019-09-25 ENCOUNTER — Other Ambulatory Visit: Payer: Self-pay | Admitting: *Deleted

## 2019-09-30 ENCOUNTER — Telehealth: Payer: Self-pay | Admitting: *Deleted

## 2019-09-30 NOTE — Telephone Encounter (Signed)
[facility] is calling to let PCP know that they acidentally gave patient Potassium Chloride instead of potassium Citrate.  Pt did take 2 doses before they realized but they have corrected the issue since.  [facility] wanted to let [facility] know. Christen Bame, CMA

## 2019-10-08 ENCOUNTER — Ambulatory Visit (INDEPENDENT_AMBULATORY_CARE_PROVIDER_SITE_OTHER): Payer: Medicare Other | Admitting: Internal Medicine

## 2019-10-08 ENCOUNTER — Encounter: Payer: Self-pay | Admitting: Internal Medicine

## 2019-10-08 VITALS — BP 114/68 | HR 77 | Temp 97.8°F | Ht 63.0 in | Wt 167.0 lb

## 2019-10-08 DIAGNOSIS — Z1211 Encounter for screening for malignant neoplasm of colon: Secondary | ICD-10-CM

## 2019-10-08 DIAGNOSIS — K219 Gastro-esophageal reflux disease without esophagitis: Secondary | ICD-10-CM | POA: Diagnosis not present

## 2019-10-08 DIAGNOSIS — R1013 Epigastric pain: Secondary | ICD-10-CM | POA: Diagnosis not present

## 2019-10-08 DIAGNOSIS — R1310 Dysphagia, unspecified: Secondary | ICD-10-CM | POA: Diagnosis not present

## 2019-10-08 MED ORDER — SUPREP BOWEL PREP KIT 17.5-3.13-1.6 GM/177ML PO SOLN: 1.0000 | ORAL | 0 refills | Status: DC

## 2019-10-08 MED ORDER — SUTAB 1479-225-188 MG PO TABS: 1.0000 | ORAL_TABLET | ORAL | 0 refills | Status: DC

## 2019-10-08 NOTE — Progress Notes (Signed)
   Subjective:    Patient ID: [name] Long, female    DOB: 1950-10-11, 69 y.o.   MRN: [id number]  HPI [name] Long is a 69 year old female with a history of GERD, COPD, history of atrial fibrillation, hypertension, diabetes, hypercholesterolemia and hypothyroidism who seen for follow-up.  I saw her in January 2021 to evaluate GERD with dysphagia symptoms but also we discussed colon cancer screening.  She returns alone today.  At the last visit we increased her omeprazole to 40 mg twice daily which did help her heartburn.  However she is still having issues with dysphagia symptom particularly if she eats meats such as beef or sausage.  She is feeling some epigastric pressure symptom as well often with eating.  Again heartburn improved.  Her bowel movements have remained "great" with a normal color without blood or melena.  She is using Metamucil which helps her bowel movements but she also feels helps her heartburn.   Review of Systems As per HPI, otherwise negative  Current Medications, Allergies, Past Medical History, Past Surgical History, Family History and Social History were reviewed in [employer] record.     Objective:   Physical Exam BP 114/68   Pulse 77   Temp 97.8 F (36.6 C)   Ht 5\' 3"  (1.6 m)   Wt 167 lb (75.8 kg)   BMI 29.58 kg/m  Gen: awake, alert, NAD HEENT: anicteric CV: RRR, no mrg Pulm: CTA b/l Abd: soft, obese, NT/ND, +BS throughout Ext: no c/c/e Neuro: nonfocal      Assessment & Plan:  69 year old female with a history of GERD, COPD, history of atrial fibrillation, hypertension, diabetes, hypercholesterolemia and hypothyroidism who seen for follow-up.  I saw her in January 2021 to evaluate GERD with dysphagia symptoms but also we discussed colon cancer screening.   1.  GERD with dysphagia/epigastric pain --heartburn improved with increased omeprazole though she continues to have dysphagia symptom and epigastric pain.  I would like  to do an ultrasound of the abdomen to rule out gallstones.  I have also recommended proceeding with upper endoscopy which after discussing the risk, benefits and alternatives she is agreeable with. --Continue omeprazole 40 mg twice daily AC --Ultrasound abdomen --Upper endoscopy with possible dilation  2.  Colon cancer screening --she is due for colon cancer screening as her last colonoscopy was greater than 10 years ago.  I recommended colonoscopy and after discussing the risk, benefits and alternatives she is agreeable and wishes to proceed  20 minutes total spent today including patient facing time, coordination of care, reviewing medical history/procedures/pertinent radiology studies, and documentation of the encounter.

## 2019-10-08 NOTE — Patient Instructions (Signed)
You have been scheduled for an endoscopy and colonoscopy. Please follow the written instructions given to you at your visit today. Please pick up your prep supplies at the pharmacy within the next 1-3 days. If you use inhalers (even only as needed), please bring them with you on the day of your procedure. Your physician has requested that you go to www.startemmi.com and enter the access code given to you at your visit today. This web site gives a general overview about your procedure. However, you should still follow specific instructions given to you by our office regarding your preparation for the procedure.  Continue omeprazole 40 mg twice daily.  You have been scheduled for an abdominal ultrasound at [facility] (1st floor of hospital) on Monday 10/14/19 at 10:30 am. Please arrive 15 minutes prior to your appointment for registration. Make certain not to have anything to eat or drink 6 hours prior to your appointment. Should you need to reschedule your appointment, please contact radiology at [phone]. This test typically takes about 30 minutes to perform.  If you are age 27 or older, your body mass index should be between 23-30. Your Body mass index is 29.58 kg/m. If this is out of the aforementioned range listed, please consider follow up with your Primary Care Provider.  If you are age 10 or younger, your body mass index should be between 19-25. Your Body mass index is 29.58 kg/m. If this is out of the aformentioned range listed, please consider follow up with your Primary Care Provider.   Due to recent changes in healthcare laws, you may see the results of your imaging and laboratory studies on MyChart before your provider has had a chance to review them.  We understand that in some cases there may be results that are confusing or concerning to you. Not all laboratory results come back in the same time frame and the provider may be waiting for multiple results in order to  interpret others.  Please give [place] 48 hours in order for your provider to thoroughly review all the results before contacting the office for clarification of your results.

## 2019-10-09 ENCOUNTER — Telehealth: Payer: Self-pay | Admitting: Family Medicine

## 2019-10-09 DIAGNOSIS — E2839 Other primary ovarian failure: Secondary | ICD-10-CM

## 2019-10-09 NOTE — Telephone Encounter (Signed)
Patient is calling and would like for Dr. Kris Mouton to place and order for a bone density test. Patient said that she spoke with [facility] and they said they would be able to do her Bone Density test the same day they do her mammogram 10/14/19 if he is able to call and give the verbal okay.   Please advise

## 2019-10-11 ENCOUNTER — Telehealth: Payer: Self-pay | Admitting: Orthopedic Surgery

## 2019-10-11 NOTE — Telephone Encounter (Signed)
[name] Long called in to schedule an appointment with Dr. Sharol Given for her shoulder.  She says the nurse she saw last time gave her an injection and it "did not work".  Scheduled appointment with  Dr. Sharol Given and she also requested a refill of hydrocodone 5.325 to be called in the [facility] [address]. Her call back number is [phone].

## 2019-10-11 NOTE — Telephone Encounter (Signed)
Contacted patient and did not see where it said she could get refills of hydrocodone. She has an appointment with Dr. Sharol Given next week to reevaluate her shoulder

## 2019-10-11 NOTE — Telephone Encounter (Signed)
Placed order for bone density test.  Guadalupe Dawn MD PGY-3 Family Medicine Resident

## 2019-10-11 NOTE — Telephone Encounter (Signed)
Do you want to refill rx for patient?

## 2019-10-14 ENCOUNTER — Ambulatory Visit: Admission: RE | Admit: 2019-10-14 | Payer: Medicare Other | Source: Ambulatory Visit

## 2019-10-14 ENCOUNTER — Ambulatory Visit (HOSPITAL_COMMUNITY)
Admission: RE | Admit: 2019-10-14 | Discharge: 2019-10-14 | Disposition: A | Discharge status: Home / Self Care | Payer: Medicare Other | Source: Ambulatory Visit | Attending: Internal Medicine | Admitting: Internal Medicine

## 2019-10-14 ENCOUNTER — Other Ambulatory Visit: Payer: Self-pay

## 2019-10-14 DIAGNOSIS — Z1211 Encounter for screening for malignant neoplasm of colon: Secondary | ICD-10-CM | POA: Diagnosis not present

## 2019-10-14 DIAGNOSIS — R1013 Epigastric pain: Secondary | ICD-10-CM | POA: Insufficient documentation

## 2019-10-14 DIAGNOSIS — R1310 Dysphagia, unspecified: Secondary | ICD-10-CM | POA: Insufficient documentation

## 2019-10-14 DIAGNOSIS — K219 Gastro-esophageal reflux disease without esophagitis: Secondary | ICD-10-CM | POA: Insufficient documentation

## 2019-10-15 ENCOUNTER — Telehealth: Payer: Self-pay | Admitting: Internal Medicine

## 2019-10-15 ENCOUNTER — Ambulatory Visit (INDEPENDENT_AMBULATORY_CARE_PROVIDER_SITE_OTHER): Payer: Medicare Other

## 2019-10-15 ENCOUNTER — Encounter: Payer: Self-pay | Admitting: Orthopedic Surgery

## 2019-10-15 ENCOUNTER — Ambulatory Visit (INDEPENDENT_AMBULATORY_CARE_PROVIDER_SITE_OTHER): Payer: Medicare Other | Admitting: Orthopedic Surgery

## 2019-10-15 DIAGNOSIS — M542 Cervicalgia: Secondary | ICD-10-CM

## 2019-10-15 DIAGNOSIS — G8929 Other chronic pain: Secondary | ICD-10-CM

## 2019-10-15 DIAGNOSIS — M25512 Pain in left shoulder: Secondary | ICD-10-CM | POA: Diagnosis not present

## 2019-10-15 MED ORDER — PREDNISONE 10 MG PO TABS: 20.0000 mg | ORAL_TABLET | Freq: Every day | ORAL | 0 refills | Status: DC

## 2019-10-15 MED ORDER — HYDROCODONE-ACETAMINOPHEN 5-325 MG PO TABS: 1.0000 | ORAL_TABLET | Freq: Four times a day (QID) | ORAL | 0 refills | Status: DC | PRN

## 2019-10-15 NOTE — Telephone Encounter (Signed)
See result note.  

## 2019-10-15 NOTE — Progress Notes (Signed)
Office Visit Note   Patient: [name] Long           Date of Birth: 1950/09/28           MRN: [id number] Visit Date: 10/15/2019              Requested by: Guadalupe Dawn, MD [phone],  [place] [id number] PCP: Guadalupe Dawn, MD  Chief Complaint  Patient presents with  . Left Shoulder - Follow-up      HPI: Patient is a 69 year old woman who presents complaining of left shoulder pain as well as pain that radiates from the shoulder up to her neck radiates down to her hand and also radiates down to her buttocks from the shoulder.  Patient also complains of numbness and tingling in the fingers of both hands.  Assessment & Plan: Visit Diagnoses:  1. Neck pain   2. Chronic left shoulder pain     Plan: Patient has had good relief with prednisone in the past she will take 20 mg of prednisone with breakfast wean down to 10 mg and then wean off at every other day.  Discussed that with the failure of a subacromial injection for the left shoulder pain her symptoms are primarily intra-articular.  Will evaluate a follow-up for possible MRI scan of her cervical spine versus MRI scan of her left shoulder.  Follow-Up Instructions: Return in about 4 weeks (around 11/12/2019).   Ortho Exam  Patient is alert, oriented, no adenopathy, well-dressed, normal affect, normal respiratory effort. Examination patient has no thoracic outlet tenderness on either side of her neck the cervical spine is nontender to palpation she does have decreased range of motion.  Motor strength is symmetric in both upper extremities and about 4/5.  No focal motor weakness.  Patient has pain to palpation over the left biceps tendon and pain with Neer and Hawkins impingement test pain with a drop arm test.  Imaging: XR Cervical Spine 2 or 3 views  Result Date: 10/15/2019 2 view radiographs of the cervical spine shows advanced degenerative changes throughout the cervical spine with osteophytic bone spurs  and joint space narrowing.  XR Shoulder Left  Result Date: 10/15/2019 2 view radiographs of the left shoulder shows a congruent glenohumeral joint space with an inferior osteophytic spur on the humeral head.  No images are attached to the encounter.  Labs: Lab Results  Component Value Date   HGBA1C 9.3 (A) 06/26/2019   HGBA1C 8.8 (A) 01/11/2019   HGBA1C 7.1 (A) 07/13/2018   LABORGA NO GROWTH 12/04/2014     Lab Results  Component Value Date   ALBUMIN 3.4 (L) 09/08/2018   ALBUMIN 3.3 (L) 12/06/2017   ALBUMIN 2.8 (L) 07/10/2017    Lab Results  Component Value Date   MG 1.7 12/25/2016   MG 1.8 06/10/2016   MG 1.8 02/02/2016   Lab Results  Component Value Date   VD25OH 43.0 02/23/2018   VD25OH 30 05/06/2015   VD25OH 12 (L) 12/18/2014    No results found for: PREALBUMIN CBC EXTENDED Latest Ref Rng & Units 09/08/2018 12/06/2017 12/06/2017  WBC 4.0 - 10.5 K/uL 7.1 - 6.2  RBC 3.87 - 5.11 MIL/uL 3.90 - 3.70(L)  HGB 12.0 - 15.0 g/dL 13.4 14.3 13.0  HCT 36.0 - 46.0 % 38.8 42.0 38.1  PLT 150 - 400 K/uL 191 - 178  NEUTROABS 1.7 - 7.7 K/uL - - 2.2  LYMPHSABS 0.7 - 4.0 K/uL - - 3.2  There is no height or weight on file to calculate BMI.  Orders:  Orders Placed This Encounter  Procedures  . XR Cervical Spine 2 or 3 views  . XR Shoulder Left   Meds ordered this encounter  Medications  . predniSONE (DELTASONE) 10 MG tablet    Sig: Take 2 tablets (20 mg total) by mouth daily with breakfast.    Dispense:  60 tablet    Refill:  0  . HYDROcodone-acetaminophen (NORCO/VICODIN) 5-325 MG tablet    Sig: Take 1 tablet by mouth every 6 (six) hours as needed for moderate pain.    Dispense:  20 tablet    Refill:  0     Procedures: No procedures performed  Clinical Data: No additional findings.  ROS:  All other systems negative, except as noted in the HPI. Review of Systems  Objective: Vital Signs: There were no vitals taken for this visit.  Specialty Comments:  No  specialty comments available.  PMFS History: Patient Active Problem List   Diagnosis Date Noted  . Dysphagia 07/02/2019  . Screen for STD (sexually transmitted disease) 03/25/2019  . 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 Other chronic pain 12/03/2018  . Dizziness of unknown cause 08/04/2018  . Tremor 07/15/2018  . Atrial flutter with rapid ventricular response ([place]) 07/10/2017  . Atrial fibrillation ([place]) 07/05/2017  . COPD (chronic obstructive pulmonary disease) ([place]) 01/22/2017  . Pedal edema   . Acute on chronic congestive heart failure ([place])   . Chronic bilateral low back pain without sciatica 08/15/2016  . 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 Schizoaffective disorder, bipolar type ([place])   . Osteoarthritis, multiple sites 06/08/2011  . Dysuria 02/14/2011  . CHRONIC KIDNEY DISEASE STAGE II (MILD) 09/14/2009  . Hypothyroidism 08/31/2006  . Type 2 diabetes mellitus ([place]) 08/31/2006  . HYPERCHOLESTEROLEMIA 08/31/2006  . Tobacco abuse 08/31/2006  . HYPERTENSION, BENIGN SYSTEMIC 08/31/2006   Past Medical History:  Diagnosis Date  . Arthritis    "real bad; all over" (07/12/2017)  . Asthma   . Atrial fibrillation ([place])   . BIPOLAR DISORDER 08/31/2006   Qualifier: Diagnosis of  By: Dorathy Daft MD, Marjory Lies    . CHRONIC KIDNEY DISEASE STAGE II (MILD) 09/14/2009   Annotation: eGFR 64 Qualifier: Diagnosis of  By: Jess Barters MD, [name]    . Chronic lower back pain   . Congestive heart failure (CHF) ([place])   . COPD (chronic obstructive pulmonary disease) ([place]) 09/23/2010   Diagnosed at [facility] in 2008 (Dr. Annamaria Boots)   . DM (diabetes mellitus) type II controlled with renal manifestation ([place]) 08/31/2006   Qualifier: Diagnosis of  By: Dorathy Daft MD, Marjory Lies    . Dyspnea    "all my life; since 6th grade" (07/12/2017)  . HYPERCHOLESTEROLEMIA 08/31/2006   Intolerance to Lipitor OK on Crestor but medicaid no longer covering    . HYPERTENSION, BENIGN SYSTEMIC  08/31/2006   Qualifier: Diagnosis of  By: Dorathy Daft MD, Marjory Lies    . Hypokalemia   . HYPOTHYROIDISM, UNSPECIFIED 08/31/2006   Qualifier: Diagnosis of  By: Dorathy Daft MD, Marjory Lies    . Leg swelling 03/14/2018  . Pneumonia    "3 times" (07/12/2017)  . Schizophrenia ([place])   . Scoliosis     Family History  Problem Relation Age of Onset  . Breast cancer Sister   . Heart disease Mother   . Rectal cancer Mother   . Diabetes Father   . Hypertension Father   . Heart disease Brother   .  Asthma Daughter   . Asthma Son   . Breast cancer Sister   . Asthma Daughter   . Colon cancer Maternal Grandmother   . Stomach cancer Neg Hx   . Esophageal cancer Neg Hx   . Pancreatic cancer Neg Hx     Past Surgical History:  Procedure Laterality Date  . CESAREAN SECTION    . FOOT FRACTURE SURGERY Right    "steel plate in it"  . FOOT SURGERY     " born w/dislocated foot"  . FRACTURE SURGERY    . KNEE ARTHROSCOPY Right   . TOE SURGERY Bilateral    "both pinky toes"  . TONSILLECTOMY AND ADENOIDECTOMY     Social History   Occupational History  . Not on file  Tobacco Use  . Smoking status: Current Some Day Smoker    Packs/day: 0.50    Years: 45.00    Pack years: 22.50    Types: Cigarettes  . Smokeless tobacco: Never Used  . Tobacco comment: patient states she smokes 1 cigarette per month  Substance and Sexual Activity  . Alcohol use: Yes    Comment: 07/12/2017 "stopped 2 yr ago; did have a drink over the holidays recently"  . Drug use: No  . Sexual activity: Not Currently

## 2019-10-21 DIAGNOSIS — H25013 Cortical age-related cataract, bilateral: Secondary | ICD-10-CM | POA: Diagnosis not present

## 2019-10-21 DIAGNOSIS — H43812 Vitreous degeneration, left eye: Secondary | ICD-10-CM | POA: Diagnosis not present

## 2019-10-21 DIAGNOSIS — H2513 Age-related nuclear cataract, bilateral: Secondary | ICD-10-CM | POA: Diagnosis not present

## 2019-10-21 DIAGNOSIS — E119 Type 2 diabetes mellitus without complications: Secondary | ICD-10-CM | POA: Diagnosis not present

## 2019-10-25 ENCOUNTER — Ambulatory Visit: Payer: Medicare Other | Attending: Internal Medicine

## 2019-10-25 DIAGNOSIS — Z23 Encounter for immunization: Secondary | ICD-10-CM

## 2019-10-25 NOTE — Progress Notes (Signed)
   Covid-19 Vaccination Clinic  Name:  [name] Long    MRN: [id number] DOB: 1950/07/10  10/25/2019  [name] was observed post Covid-19 immunization for 15 minutes without incident. She was provided with Vaccine Information Sheet and instruction to access the V-Safe system.   [name] was instructed to call 911 with any severe reactions post vaccine: Marland Kitchen Difficulty breathing  . Swelling of face and throat  . A fast heartbeat  . A bad rash all over body  . Dizziness and weakness   Immunizations Administered    Name Date Dose VIS Date Route   Pfizer COVID-19 Vaccine 10/25/2019  2:13 PM 0.3 mL 08/28/2018 Intramuscular   Manufacturer: [place]   Lot: [id number]   [place]: [id number]

## 2019-10-28 ENCOUNTER — Telehealth: Payer: Self-pay | Admitting: Orthopedic Surgery

## 2019-10-28 NOTE — Telephone Encounter (Signed)
Submitted for VOB for Monovisc-Right knee

## 2019-10-28 NOTE — Telephone Encounter (Signed)
This pt last had a gel injection on 05/13/19 she would be eligible for repeat injection 11/10/19 can we apply for approval please?

## 2019-10-28 NOTE — Telephone Encounter (Signed)
Never received this order. Please advise

## 2019-10-28 NOTE — Telephone Encounter (Signed)
[name] called in today to reschedule her appointment.  She is now coming in to see Dr. Sharol Given on 5/13 and would like to make sure her gel injection will be ready that day.  She states she is supposed to be having a gel injection in her right knee.  Please let her know if there is an issue.

## 2019-10-30 ENCOUNTER — Other Ambulatory Visit (HOSPITAL_COMMUNITY)
Admission: RE | Admit: 2019-10-30 | Discharge: 2019-10-30 | Disposition: A | Discharge status: Home / Self Care | Payer: Medicare Other | Source: Ambulatory Visit | Attending: Family Medicine | Admitting: Family Medicine

## 2019-10-30 ENCOUNTER — Encounter: Payer: Self-pay | Admitting: Family Medicine

## 2019-10-30 ENCOUNTER — Ambulatory Visit (INDEPENDENT_AMBULATORY_CARE_PROVIDER_SITE_OTHER): Payer: Medicare Other | Admitting: Family Medicine

## 2019-10-30 ENCOUNTER — Telehealth: Payer: Self-pay

## 2019-10-30 ENCOUNTER — Other Ambulatory Visit: Payer: Self-pay

## 2019-10-30 VITALS — BP 115/80 | HR 80 | Ht 63.0 in | Wt 166.6 lb

## 2019-10-30 DIAGNOSIS — N898 Other specified noninflammatory disorders of vagina: Secondary | ICD-10-CM | POA: Diagnosis not present

## 2019-10-30 DIAGNOSIS — E039 Hypothyroidism, unspecified: Secondary | ICD-10-CM | POA: Diagnosis not present

## 2019-10-30 DIAGNOSIS — Z114 Encounter for screening for human immunodeficiency virus [HIV]: Secondary | ICD-10-CM | POA: Diagnosis not present

## 2019-10-30 DIAGNOSIS — N183 Chronic kidney disease, stage 3 unspecified: Secondary | ICD-10-CM

## 2019-10-30 DIAGNOSIS — E1122 Type 2 diabetes mellitus with diabetic chronic kidney disease: Secondary | ICD-10-CM | POA: Diagnosis not present

## 2019-10-30 DIAGNOSIS — Z7251 High risk heterosexual behavior: Secondary | ICD-10-CM | POA: Diagnosis not present

## 2019-10-30 DIAGNOSIS — Z113 Encounter for screening for infections with a predominantly sexual mode of transmission: Secondary | ICD-10-CM

## 2019-10-30 LAB — POCT WET PREP (WET MOUNT)
Clue Cells Wet Prep Whiff POC: POSITIVE
Trichomonas Wet Prep HPF POC: ABSENT

## 2019-10-30 LAB — POCT GLYCOSYLATED HEMOGLOBIN (HGB A1C): HbA1c, POC (controlled diabetic range): 12.2 % — AB (ref 0.0–7.0)

## 2019-10-30 NOTE — Telephone Encounter (Signed)
Approved for Monovisc-Right knee Buy and Bill Dr. Sharol Given No copay 20% OOP No prior auth required   [facility] for repeat injection after 11/10/19

## 2019-10-30 NOTE — Telephone Encounter (Signed)
Lvm for pt to call back to inform 

## 2019-10-30 NOTE — Patient Instructions (Addendum)
It was great seeing you again today!  We performed a wet prep, gonorrhea/chlamydia test, and blood work for HIV and syphilis.  We are also going to get a diabetes test and a test to check her thyroid function.  We can get more answers about your esophagus in your stomach after you see Dr. Hilarie Fredrickson next week and get your EGD and your colonoscopy done.

## 2019-10-31 LAB — CERVICOVAGINAL ANCILLARY ONLY
Chlamydia: NEGATIVE
Comment: NEGATIVE
Comment: NEGATIVE
Comment: NORMAL
Neisseria Gonorrhea: NEGATIVE
Trichomonas: NEGATIVE

## 2019-10-31 LAB — HIV ANTIBODY (ROUTINE TESTING W REFLEX): HIV Screen 4th Generation wRfx: NONREACTIVE

## 2019-10-31 LAB — TSH: TSH: 0.951 u[IU]/mL (ref 0.450–4.500)

## 2019-10-31 LAB — RPR: RPR Ser Ql: NONREACTIVE

## 2019-11-01 ENCOUNTER — Other Ambulatory Visit: Payer: Self-pay | Admitting: Internal Medicine

## 2019-11-01 ENCOUNTER — Ambulatory Visit (INDEPENDENT_AMBULATORY_CARE_PROVIDER_SITE_OTHER): Payer: Medicare Other

## 2019-11-01 ENCOUNTER — Telehealth: Payer: Self-pay | Admitting: Family Medicine

## 2019-11-01 DIAGNOSIS — Z1159 Encounter for screening for other viral diseases: Secondary | ICD-10-CM | POA: Diagnosis not present

## 2019-11-01 MED ORDER — METRONIDAZOLE 500 MG PO TABS
500.0000 mg | ORAL_TABLET | Freq: Three times a day (TID) | ORAL | 0 refills | Status: DC
Start: 2019-11-01 — End: 2020-04-05

## 2019-11-01 NOTE — Telephone Encounter (Signed)
Patient calls nurse line requesting provider call her with recent lab results. Patient would also like medication sent into pharmacy for vaginal discharge/ BV.   To PCP  Talbot Grumbling, RN

## 2019-11-01 NOTE — Progress Notes (Signed)
   CHIEF COMPLAINT / HPI: 69 year old female who presents for STD checks.  She has been having some vaginal itching but no other symptoms.  Patient states that she is ready to "get out there" and wants to make sure that she is clear of STDs.  Patient also presents for hemoglobin A1c check and thyroid function test.  Patient is adamant that she does not ever want to start insulin therapy or injectables.  She has been working on cutting out real sodas with diet sodas.  Of note the patient has been receiving oral steroids and steroid joint injections from her orthopedist.  She has not been having any symptoms of hyperglycemia per her report, which includes polyuria, polydipsia, nausea, abdominal pain.  PERTINENT  PMH / PSH: Type 2 diabetes, hypothyroidism  OBJECTIVE: BP 115/80   Pulse 80   Ht 5\' 3"  (1.6 m)   Wt 166 lb 9.6 oz (75.6 kg)   SpO2 98%   BMI 29.51 kg/m   Gen: Well-appearing 69 year old female, no acute distress CV: Regular rate and rhythm, no M/R/G Resp: No respiratory distress, no accessory muscle use Neuro: Alert and oriented, Speech clear, No gross deficits GU: Wet prep consistent with bacterial vaginosis, positive clue cells, positive whiff Cervix easily visualized, no signs of vaginal atrophy.  ASSESSMENT / PLAN:  Hypothyroidism TSH 0.51.  Within normal range.  Continue Synthroid 25 mcg daily.  Type 2 diabetes mellitus (HCC) Hemoglobin A1c 12.2 from 9.2 when tested 3 months ago.  I believe the patient's frequent steroid Dosepaks and injections are likely contributing to this although she has a lot of work to be from a dietary perspective as well.  Attempted to try and reach patient to discuss results with her, will continue trying.  She is a candidate for insulin therapy, would likely benefit from SGLT2 or GLP-1.  Continue Metformin 1 g twice daily for now.  Screening examination for STD (sexually transmitted disease) STD screening negative.  Wet prep consistent with  bacterial vaginosis.  Sent in Flagyl 500 g twice daily for 7 days.  Guadalupe Dawn MD PGY-3 Family Medicine Resident [place]

## 2019-11-01 NOTE — Telephone Encounter (Signed)
Patient is calling and would like for Dr. Kris Mouton to call her to discuss results from her last appointment. She said she is also still having vaginal itching and would like to know if he could send in medications.   The best call back number for patient is [phone]

## 2019-11-01 NOTE — Telephone Encounter (Signed)
Attempted to reach patient with results. Phone rang once and then went to voicemail. Will send in flagyl treatment. 500mg  bid for 7 days for the bv. I will continue to try and reach her. Her TSH looks good, but I will need to discuss her diabetes with her. Her A1C has increased significantly and needs to be addressed. The rest of her std testing was negative, fortunately.  Guadalupe Dawn MD PGY-3 Family Medicine Resident

## 2019-11-01 NOTE — Assessment & Plan Note (Signed)
TSH 0.51.  Within normal range.  Continue Synthroid 25 mcg daily.

## 2019-11-01 NOTE — Assessment & Plan Note (Signed)
Hemoglobin A1c 12.2 from 9.2 when tested 3 months ago.  I believe the patient's frequent steroid Dosepaks and injections are likely contributing to this although she has a lot of work to be from a dietary perspective as well.  Attempted to try and reach patient to discuss results with her, will continue trying.  She is a candidate for insulin therapy, would likely benefit from SGLT2 or GLP-1.  Continue Metformin 1 g twice daily for now.

## 2019-11-01 NOTE — Assessment & Plan Note (Signed)
STD screening negative.  Wet prep consistent with bacterial vaginosis.  Sent in Flagyl 500 g twice daily for 7 days.

## 2019-11-02 LAB — SARS CORONAVIRUS 2 (TAT 6-24 HRS): SARS Coronavirus 2: NEGATIVE

## 2019-11-05 ENCOUNTER — Encounter: Payer: Self-pay | Admitting: Internal Medicine

## 2019-11-05 ENCOUNTER — Other Ambulatory Visit: Payer: Self-pay

## 2019-11-05 ENCOUNTER — Ambulatory Visit (AMBULATORY_SURGERY_CENTER): Payer: Medicare Other | Admitting: Internal Medicine

## 2019-11-05 VITALS — BP 110/53 | HR 82 | Temp 97.5°F | Resp 12 | Ht 63.0 in | Wt 166.0 lb

## 2019-11-05 DIAGNOSIS — D123 Benign neoplasm of transverse colon: Secondary | ICD-10-CM | POA: Diagnosis not present

## 2019-11-05 DIAGNOSIS — K2950 Unspecified chronic gastritis without bleeding: Secondary | ICD-10-CM | POA: Diagnosis not present

## 2019-11-05 DIAGNOSIS — D128 Benign neoplasm of rectum: Secondary | ICD-10-CM

## 2019-11-05 DIAGNOSIS — K621 Rectal polyp: Secondary | ICD-10-CM | POA: Diagnosis not present

## 2019-11-05 DIAGNOSIS — R1319 Other dysphagia: Secondary | ICD-10-CM

## 2019-11-05 DIAGNOSIS — R1310 Dysphagia, unspecified: Secondary | ICD-10-CM

## 2019-11-05 DIAGNOSIS — K2970 Gastritis, unspecified, without bleeding: Secondary | ICD-10-CM

## 2019-11-05 DIAGNOSIS — R1013 Epigastric pain: Secondary | ICD-10-CM | POA: Diagnosis not present

## 2019-11-05 DIAGNOSIS — K2990 Gastroduodenitis, unspecified, without bleeding: Secondary | ICD-10-CM

## 2019-11-05 DIAGNOSIS — K3189 Other diseases of stomach and duodenum: Secondary | ICD-10-CM

## 2019-11-05 DIAGNOSIS — Z1211 Encounter for screening for malignant neoplasm of colon: Secondary | ICD-10-CM | POA: Diagnosis not present

## 2019-11-05 DIAGNOSIS — J449 Chronic obstructive pulmonary disease, unspecified: Secondary | ICD-10-CM | POA: Diagnosis not present

## 2019-11-05 DIAGNOSIS — I509 Heart failure, unspecified: Secondary | ICD-10-CM | POA: Diagnosis not present

## 2019-11-05 DIAGNOSIS — K219 Gastro-esophageal reflux disease without esophagitis: Secondary | ICD-10-CM | POA: Diagnosis not present

## 2019-11-05 MED ORDER — SODIUM CHLORIDE 0.9 % IV SOLN: 500.0000 mL | Freq: Once | INTRAVENOUS | Status: DC

## 2019-11-05 NOTE — Op Note (Signed)
[place] Patient Name: [name] Long Procedure Date: 11/05/2019 2:03 PM MRN: [id number] Endoscopist: Jerene Bears , MD Age: 69 Referring MD:  Date of Birth: 1951-04-01 Gender: Female Account #: [id number] Procedure:                Upper GI endoscopy Indications:              Epigastric abdominal pain, Dysphagia Medicines:                Monitored Anesthesia Care Procedure:                Pre-Anesthesia Assessment:                           - Prior to the procedure, a History and Physical                            was performed, and patient medications and                            allergies were reviewed. The patient's tolerance of                            previous anesthesia was also reviewed. The risks                            and benefits of the procedure and the sedation                            options and risks were discussed with the patient.                            All questions were answered, and informed consent                            was obtained. Prior Anticoagulants: The patient has                            taken no previous anticoagulant or antiplatelet                            agents. ASA Grade Assessment: III - A patient with                            severe systemic disease. After reviewing the risks                            and benefits, the patient was deemed in                            satisfactory condition to undergo the procedure.                           After obtaining informed consent, the endoscope was  passed under direct vision. Throughout the                            procedure, the patient's blood pressure, pulse, and                            oxygen saturations were monitored continuously. The                            Endoscope was introduced through the mouth, and                            advanced to the second part of duodenum. The upper                            GI endoscopy was  accomplished without difficulty.                            The patient tolerated the procedure well. Scope In: Scope Out: Findings:                 The examined esophagus was normal.                           No endoscopic abnormality was evident in the                            esophagus to explain the patient's complaint of                            dysphagia. It was decided, however, to proceed with                            dilation of the entire esophagus. A guidewire was                            placed and the scope was withdrawn. Dilation was                            performed with a Savary dilator with mild                            resistance at 51 Fr.                           Mild inflammation characterized by erythema and                            granularity was found in the gastric body and in                            the gastric antrum. Biopsies were taken with a cold  forceps for histology and Helicobacter pylori                            testing.                           The examined duodenum was normal. Complications:            No immediate complications. Estimated Blood Loss:     Estimated blood loss was minimal. Impression:               - Normal esophagus.                           - No endoscopic esophageal abnormality to explain                            patient's dysphagia. Esophagus dilated to 17 mm                            with 51 Fr Savary.                           - Gastritis. Biopsied.                           - Normal examined duodenum. Recommendation:           - Patient has a contact number available for                            emergencies. The signs and symptoms of potential                            delayed complications were discussed with the                            patient. Return to normal activities tomorrow.                            Written discharge instructions were provided to the                             patient.                           - Resume previous diet.                           - Continue present medications.                           - Await pathology results.                           - Referral to [facility] for dyspnea on  exertion (cardiac evaluation unrevealing, EGD                            unrevealing for this symptom). Jerene Bears, MD 11/05/2019 2:49:25 PM This report has been signed electronically.

## 2019-11-05 NOTE — Progress Notes (Signed)
Pt's states no medical or surgical changes since previsit or office visit. 

## 2019-11-05 NOTE — Progress Notes (Signed)
Report given to PACU, vss 

## 2019-11-05 NOTE — Progress Notes (Signed)
Called to room to assist during endoscopic procedure.  Patient ID and intended procedure confirmed with present staff. Received instructions for my participation in the procedure from the performing physician.  

## 2019-11-05 NOTE — Patient Instructions (Signed)
YOU HAD AN ENDOSCOPIC PROCEDURE TODAY AT THE [facility]:   Refer to the procedure report that was given to you for any specific questions about what was found during the examination.  If the procedure report does not answer your questions, please call your gastroenterologist to clarify.  If you requested that your care partner not be given the details of your procedure findings, then the procedure report has been included in a sealed envelope for you to review at your convenience later.  YOU SHOULD EXPECT: Some feelings of bloating in the abdomen. Passage of more gas than usual.  Walking can help get rid of the air that was put into your GI tract during the procedure and reduce the bloating. If you had a lower endoscopy (such as a colonoscopy or flexible sigmoidoscopy) you may notice spotting of blood in your stool or on the toilet paper. If you underwent a bowel prep for your procedure, you may not have a normal bowel movement for a few days.  Please Note:  You might notice some irritation and congestion in your nose or some drainage.  This is from the oxygen used during your procedure.  There is no need for concern and it should clear up in a day or so.  SYMPTOMS TO REPORT IMMEDIATELY:   Following lower endoscopy (colonoscopy or flexible sigmoidoscopy):  Excessive amounts of blood in the stool  Significant tenderness or worsening of abdominal pains  Swelling of the abdomen that is new, acute  Fever of 100F or higher   Following upper endoscopy (EGD)  Vomiting of blood or coffee ground material  New chest pain or pain under the shoulder blades  Painful or persistently difficult swallowing  New shortness of breath  Fever of 100F or higher  Black, tarry-looking stools  For urgent or emergent issues, a gastroenterologist can be reached at any hour by calling [phone]. Do not use MyChart messaging for urgent concerns.    DIET:  We do recommend a small meal at first, but  then you may proceed to your regular diet.  Drink plenty of fluids but you should avoid alcoholic beverages for 24 hours.  ACTIVITY:  You should plan to take it easy for the rest of today and you should NOT DRIVE or use heavy machinery until tomorrow (because of the sedation medicines used during the test).    FOLLOW UP: Our staff will call the number listed on your records 48-72 hours following your procedure to check on you and address any questions or concerns that you may have regarding the information given to you following your procedure. If we do not reach you, we will leave a message.  We will attempt to reach you two times.  During this call, we will ask if you have developed any symptoms of COVID 19. If you develop any symptoms (ie: fever, flu-like symptoms, shortness of breath, cough etc.) before then, please call [phone].  If you test positive for Covid 19 in the 2 weeks post procedure, please call and report this information to us.    If any biopsies were taken you will be contacted by phone or by letter within the next 1-3 weeks.  Please call us at [phone] if you have not heard about the biopsies in 3 weeks.    SIGNATURES/CONFIDENTIALITY: You and/or your care partner have signed paperwork which will be entered into your electronic medical record.  These signatures attest to the fact that that the information above on   your After Visit Summary has been reviewed and is understood.  Full responsibility of the confidentiality of this discharge information lies with you and/or your care-partner. 

## 2019-11-05 NOTE — Op Note (Signed)
[place] Patient Name: [name] Long Procedure Date: 11/05/2019 2:03 PM MRN: [id number] Endoscopist: Jerene Bears , MD Age: 69 Referring MD:  Date of Birth: 1951-03-05 Gender: Female Account #: [id number] Procedure:                Colonoscopy Indications:              Screening for colorectal malignant neoplasm, Last                            colonoscopy 10 years ago Medicines:                Monitored Anesthesia Care Procedure:                Pre-Anesthesia Assessment:                           - Prior to the procedure, a History and Physical                            was performed, and patient medications and                            allergies were reviewed. The patient's tolerance of                            previous anesthesia was also reviewed. The risks                            and benefits of the procedure and the sedation                            options and risks were discussed with the patient.                            All questions were answered, and informed consent                            was obtained. Prior Anticoagulants: The patient has                            taken no previous anticoagulant or antiplatelet                            agents. ASA Grade Assessment: III - A patient with                            severe systemic disease. After reviewing the risks                            and benefits, the patient was deemed in                            satisfactory condition to undergo the procedure.  After obtaining informed consent, the colonoscope                            was passed under direct vision. Throughout the                            procedure, the patient's blood pressure, pulse, and                            oxygen saturations were monitored continuously. The                            Colonoscope was introduced through the anus and                            advanced to the cecum, identified by  appendiceal                            orifice and ileocecal valve. The colonoscopy was                            performed without difficulty. The patient tolerated                            the procedure well. The quality of the bowel                            preparation was good. The ileocecal valve,                            appendiceal orifice, and rectum were photographed. Scope In: 2:30:04 PM Scope Out: 2:42:41 PM Scope Withdrawal Time: 0 hours 9 minutes 56 seconds  Total Procedure Duration: 0 hours 12 minutes 37 seconds  Findings:                 The digital rectal exam was normal.                           Three sessile polyps were found in the transverse                            colon. The polyps were 3 to 6 mm in size. These                            polyps were removed with a cold snare. Resection                            and retrieval were complete.                           A 6 mm polyp was found in the proximal rectum. The                            polyp was sessile.  The polyp was removed with a                            cold snare. Resection and retrieval were complete.                           The exam was otherwise without abnormality on                            direct and retroflexion views. Complications:            No immediate complications. Estimated Blood Loss:     Estimated blood loss was minimal. Impression:               - Three 3 to 6 mm polyps in the transverse colon,                            removed with a cold snare. Resected and retrieved.                           - One 6 mm polyp in the proximal rectum, removed                            with a cold snare. Resected and retrieved.                           - The examination was otherwise normal on direct                            and retroflexion views. Recommendation:           - Patient has a contact number available for                            emergencies. The signs and symptoms of  potential                            delayed complications were discussed with the                            patient. Return to normal activities tomorrow.                            Written discharge instructions were provided to the                            patient.                           - Resume previous diet.                           - Continue present medications.                           - Await pathology results.                           -  Repeat colonoscopy is recommended. The                            colonoscopy date will be determined after pathology                            results from today's exam become available for                            review. Jerene Bears, MD 11/05/2019 2:51:03 PM This report has been signed electronically.

## 2019-11-06 ENCOUNTER — Other Ambulatory Visit: Payer: Self-pay

## 2019-11-06 ENCOUNTER — Other Ambulatory Visit: Payer: Self-pay | Admitting: Orthopedic Surgery

## 2019-11-06 DIAGNOSIS — R0600 Dyspnea, unspecified: Secondary | ICD-10-CM

## 2019-11-07 ENCOUNTER — Telehealth: Payer: Self-pay

## 2019-11-07 ENCOUNTER — Other Ambulatory Visit: Payer: Self-pay

## 2019-11-07 NOTE — Telephone Encounter (Signed)
Patient calls nurse line regarding receiving rx for new nebulizer machine. Patient is using [place]. Provided phone number to call in order: [phone].   To PCP  Talbot Grumbling, RN

## 2019-11-07 NOTE — Telephone Encounter (Addendum)
Patient calls nurse line again to check status of receiving nebulizer and refills. Patient needs updated visit note speaking to need of nebulizer machine for insurance purposes. Patient has COPD and has had machine for the last five years.   Scheduled virtual visit tomorrow AM for follow up.   I have started the paperwork for nebulizer. Once virtual visit is complete, I will finish processing order.   Talbot Grumbling, RN

## 2019-11-07 NOTE — Telephone Encounter (Signed)
  Follow up Call-  Call back number 11/05/2019  Post procedure Call Back phone  # [phone]  Permission to leave phone message Yes  Some recent data might be hidden     Patient questions:  Do you have a fever, pain , or abdominal swelling? No. Pain Score  0 *  Have you tolerated food without any problems? Yes.    Have you been able to return to your normal activities? Yes.    Do you have any questions about your discharge instructions: Diet   No. Medications  No. Follow up visit  No.  Do you have questions or concerns about your Care? No.  Actions: * If pain score is 4 or above: No action needed, pain <4.   1. Have you developed a fever since your procedure? No  2.   Have you had an respiratory symptoms (SOB or cough) since your procedure? No  3.   Have you tested positive for COVID 19 since your procedure No  4.   Have you had any family members/close contacts diagnosed with the COVID 19 since your procedure?  No   If yes to any of these questions please route to Joylene John, RN and Erenest Rasher, RN

## 2019-11-08 ENCOUNTER — Other Ambulatory Visit: Payer: Self-pay

## 2019-11-08 ENCOUNTER — Telehealth (INDEPENDENT_AMBULATORY_CARE_PROVIDER_SITE_OTHER): Payer: Medicare Other | Admitting: Family Medicine

## 2019-11-08 ENCOUNTER — Encounter: Payer: Self-pay | Admitting: Family Medicine

## 2019-11-08 VITALS — Ht 63.0 in

## 2019-11-08 DIAGNOSIS — J449 Chronic obstructive pulmonary disease, unspecified: Secondary | ICD-10-CM

## 2019-11-08 DIAGNOSIS — N898 Other specified noninflammatory disorders of vagina: Secondary | ICD-10-CM

## 2019-11-08 DIAGNOSIS — E1122 Type 2 diabetes mellitus with diabetic chronic kidney disease: Secondary | ICD-10-CM

## 2019-11-08 DIAGNOSIS — N183 Chronic kidney disease, stage 3 unspecified: Secondary | ICD-10-CM

## 2019-11-08 MED ORDER — ROSUVASTATIN CALCIUM 20 MG PO TABS: 20.0000 mg | ORAL_TABLET | Freq: Every day | ORAL | 11 refills | Status: DC

## 2019-11-08 MED ORDER — FLUTICASONE-UMECLIDIN-VILANT 100-62.5-25 MCG/INH IN AEPB: 100.0000 ug | INHALATION_SPRAY | Freq: Every day | RESPIRATORY_TRACT | 3 refills | Status: DC

## 2019-11-08 NOTE — Progress Notes (Signed)
[place] Telemedicine Visit  Patient consented to have virtual visit and was identified by name and date of birth. Method of visit: Telephone  Encounter participants: Patient: [name] Long - located at home Provider: Patriciaann Clan - located at [facility] Others (if applicable): none   Chief Complaint: needs new nebulizer   HPI: [name] Long is a 69 year old female presenting via telephone to discuss necessity for nebulizer.    Nebulizer: She has used a nebulizer for the past 5 years for her COPD/bronchitis.  Uses albuterol 2.5 mL every 4 hours as needed breathing flares, otherwise does not use every day.  She feels that the albuterol inhaler is less efficacious for her when her breathing and struggling, cannot get in solution.  Doing well currently, breathing at baseline.  Receives her nebulizer from [facility].  Vaginal irritation: She is several days into her Flagyl for bacterial vaginosis after being diagnosed at the end of 10/2019.  States she is still having some itching and change in vaginal odor.  Recent A1c was 12.2.  Denies any dysuria, urinary frequency above baseline, fever, abdominal pain.   ROS: per HPI  Pertinent PMHx: Hypothyroidism, CKD stage II, COPD, T2DM, hypertension  Exam:  Ht 5\' 3"  (1.6 m)   BMI 29.41 kg/m   Respiratory: Unlabored breathing during conversation, no coughing  Assessment/Plan:  COPD (chronic obstructive pulmonary disease) (HCC) She has been stable on albuterol nebulizers for the past 5 years, it is of a medical necessity to continue her nebulizer prescription through [facility].  Will complete forms and send to her agency.  Also refilled her controller inhaler.  Type 2 diabetes mellitus (HCC) A1c 12.2 in 10/2019 (increased from 9.2).  Reviewed PCPs notes who has had difficulty reaching patient to discuss this finding.  Briefly discussed these results with patient and scheduled her an appointment with Dr. Kris Mouton on  5/19 to discuss diabetic management.  Vaginal itching Currently being treated for wet prep confirmed bacterial vaginosis with Flagyl.  Instructed to complete course of Flagyl and strive towards a more well-balanced diet to mitigate likely glucosuria contributing.  Has follow-up with PCP soon, could consider treating with vaginal terconazole if itching persistent despite recent treatment with Flagyl and Diflucan.     F/u on 5/19 scheduled on phone visit.   Time spent during visit with patient: 14 minutes  Patriciaann Clan, DO

## 2019-11-10 ENCOUNTER — Encounter: Payer: Self-pay | Admitting: Family Medicine

## 2019-11-10 DIAGNOSIS — N898 Other specified noninflammatory disorders of vagina: Secondary | ICD-10-CM | POA: Insufficient documentation

## 2019-11-10 NOTE — Assessment & Plan Note (Signed)
A1c 12.2 in 10/2019 (increased from 9.2).  Reviewed PCPs notes who has had difficulty reaching patient to discuss this finding.  Briefly discussed these results with patient and scheduled her an appointment with Dr. Kris Mouton on 5/19 to discuss diabetic management.

## 2019-11-10 NOTE — Assessment & Plan Note (Signed)
She has been stable on albuterol nebulizers for the past 5 years, it is of a medical necessity to continue her nebulizer prescription through [facility].  Will complete forms and send to her agency.  Also refilled her controller inhaler.

## 2019-11-10 NOTE — Assessment & Plan Note (Signed)
Currently being treated for wet prep confirmed bacterial vaginosis with Flagyl.  Instructed to complete course of Flagyl and strive towards a more well-balanced diet to mitigate likely glucosuria contributing.  Has follow-up with PCP soon, could consider treating with vaginal terconazole if itching persistent despite recent treatment with Flagyl and Diflucan.

## 2019-11-11 ENCOUNTER — Telehealth: Payer: Self-pay | Admitting: Family Medicine

## 2019-11-11 ENCOUNTER — Encounter: Payer: Self-pay | Admitting: Internal Medicine

## 2019-11-11 MED ORDER — ALBUTEROL SULFATE HFA 108 (90 BASE) MCG/ACT IN AERS: INHALATION_SPRAY | RESPIRATORY_TRACT | 0 refills | Status: DC

## 2019-11-11 NOTE — Telephone Encounter (Signed)
Pt is calling to let the doctor know where to fax the paperwork at [facility] [employer]

## 2019-11-12 ENCOUNTER — Ambulatory Visit: Payer: Medicare Other | Admitting: Orthopedic Surgery

## 2019-11-12 DIAGNOSIS — R072 Precordial pain: Secondary | ICD-10-CM | POA: Diagnosis not present

## 2019-11-12 DIAGNOSIS — I10 Essential (primary) hypertension: Secondary | ICD-10-CM | POA: Diagnosis not present

## 2019-11-12 DIAGNOSIS — J4520 Mild intermittent asthma, uncomplicated: Secondary | ICD-10-CM | POA: Diagnosis not present

## 2019-11-12 DIAGNOSIS — R0602 Shortness of breath: Secondary | ICD-10-CM | POA: Diagnosis not present

## 2019-11-14 ENCOUNTER — Telehealth: Payer: Self-pay | Admitting: Family Medicine

## 2019-11-14 ENCOUNTER — Ambulatory Visit (INDEPENDENT_AMBULATORY_CARE_PROVIDER_SITE_OTHER): Payer: Medicare Other | Admitting: Orthopedic Surgery

## 2019-11-14 ENCOUNTER — Other Ambulatory Visit: Payer: Self-pay

## 2019-11-14 ENCOUNTER — Encounter: Payer: Self-pay | Admitting: Orthopedic Surgery

## 2019-11-14 VITALS — Ht 63.0 in | Wt 166.0 lb

## 2019-11-14 DIAGNOSIS — M1712 Unilateral primary osteoarthritis, left knee: Secondary | ICD-10-CM

## 2019-11-14 MED ORDER — HYDROCODONE-ACETAMINOPHEN 5-325 MG PO TABS: 1.0000 | ORAL_TABLET | Freq: Four times a day (QID) | ORAL | 0 refills | Status: DC | PRN

## 2019-11-14 MED ORDER — PREDNISONE 10 MG PO TABS: 20.0000 mg | ORAL_TABLET | Freq: Every day | ORAL | 0 refills | Status: DC

## 2019-11-14 NOTE — Telephone Encounter (Signed)
Forms faxed to agency at this time. Copy placed in batch scanning.   Talbot Grumbling, RN

## 2019-11-14 NOTE — Telephone Encounter (Signed)
Patient is calling needing her forms faxed to [facility] for her nebulizer. She said she is really needing it. It was faxed to [place] lasted week. Checked Dr's box not in there. Thanks

## 2019-11-18 ENCOUNTER — Encounter: Payer: Self-pay | Admitting: Orthopedic Surgery

## 2019-11-18 ENCOUNTER — Ambulatory Visit: Payer: Medicare Other | Admitting: Orthopedic Surgery

## 2019-11-18 ENCOUNTER — Telehealth: Payer: Self-pay | Admitting: Family Medicine

## 2019-11-18 ENCOUNTER — Ambulatory Visit: Payer: Medicare Other

## 2019-11-18 DIAGNOSIS — M1712 Unilateral primary osteoarthritis, left knee: Secondary | ICD-10-CM

## 2019-11-18 MED ORDER — LIDOCAINE HCL 1 % IJ SOLN
1.0000 mL | INTRAMUSCULAR | Status: AC | PRN
  Administered 2019-11-18: 1 mL

## 2019-11-18 MED ORDER — HYALURONAN 88 MG/4ML IX SOSY
88.0000 mg | PREFILLED_SYRINGE | INTRA_ARTICULAR | Status: AC | PRN
  Administered 2019-11-18: 88 mg via INTRA_ARTICULAR

## 2019-11-18 MED ORDER — METHYLPREDNISOLONE ACETATE 40 MG/ML IJ SUSP
40.0000 mg | INTRAMUSCULAR | Status: AC | PRN
  Administered 2019-11-18: 40 mg via INTRA_ARTICULAR

## 2019-11-18 NOTE — Progress Notes (Signed)
Office Visit Note   Patient: [name] Long           Date of Birth: [birth year]           MRN: [id number] Visit Date: 11/14/2019              Requested by: Guadalupe Dawn, MD [id number],  [place] [address] PCP: Guadalupe Dawn, MD  Chief Complaint  Patient presents with  . Right Knee - Pain    [employer] right knee       HPI: Patient is a 69 year old woman with uncontrolled type 2 diabetes with osteoarthritis both knees.  Patient presents for evaluation for osteoarthritis of her left knee.  Assessment & Plan: Visit Diagnoses:  1. Unilateral primary osteoarthritis, left knee     Plan: Left knee was injected patient will follow-up as needed and application was completed so patient does not have to take her garbage to the curbside.  Patient was given a prescription for Vicodin and prednisone.  Follow-Up Instructions: Return if symptoms worsen or fail to improve.   Ortho Exam  Patient is alert, oriented, no adenopathy, well-dressed, normal affect, normal respiratory effort. Examination patient has no effusion no cellulitis she has crepitation with range of motion of the left knee collaterals and cruciates are stable.  Most recent hemoglobin A1c is 12.2.  Imaging: No results found. No images are attached to the encounter.  Labs: Lab Results  Component Value Date   HGBA1C 12.2 (A) 10/30/2019   HGBA1C 9.3 (A) 06/26/2019   HGBA1C 8.8 (A) 01/11/2019   LABORGA NO GROWTH 12/04/2014     Lab Results  Component Value Date   ALBUMIN 3.4 (L) 09/08/2018   ALBUMIN 3.3 (L) 12/06/2017   ALBUMIN 2.8 (L) 07/10/2017    Lab Results  Component Value Date   MG 1.7 12/25/2016   MG 1.8 06/10/2016   MG 1.8 02/02/2016   Lab Results  Component Value Date   VD25OH 43.0 02/23/2018   VD25OH 30 05/06/2015   VD25OH 12 (L) 12/18/2014    No results found for: PREALBUMIN CBC EXTENDED Latest Ref Rng & Units 09/08/2018 12/06/2017 12/06/2017  WBC 4.0 - 10.5  K/uL 7.1 - 6.2  RBC 3.87 - 5.11 MIL/uL 3.90 - 3.70(L)  HGB 12.0 - 15.0 g/dL 13.4 14.3 13.0  HCT 36.0 - 46.0 % 38.8 42.0 38.1  PLT 150 - 400 K/uL 191 - 178  NEUTROABS 1.7 - 7.7 K/uL - - 2.2  LYMPHSABS 0.7 - 4.0 K/uL - - 3.2     Body mass index is 29.41 kg/m.  Orders:  No orders of the defined types were placed in this encounter.  Meds ordered this encounter  Medications  . predniSONE (DELTASONE) 10 MG tablet    Sig: Take 2 tablets (20 mg total) by mouth daily with breakfast.    Dispense:  60 tablet    Refill:  0  . HYDROcodone-acetaminophen (NORCO/VICODIN) 5-325 MG tablet    Sig: Take 1 tablet by mouth every 6 (six) hours as needed for moderate pain.    Dispense:  20 tablet    Refill:  0     Procedures: Large Joint Inj: L knee on 11/18/2019 12:21 PM Indications: pain and diagnostic evaluation Details: 22 G 1.5 in needle  Arthrogram: No  Medications: 40 mg methylPREDNISolone acetate 40 MG/ML; 1 mL lidocaine 1 %; 88 mg Hyaluronan 88 MG/4ML Outcome: tolerated well, no immediate complications Procedure, treatment alternatives, risks and benefits explained, specific  risks discussed. Consent was given by the patient. Immediately prior to procedure a time out was called to verify the correct patient, procedure, equipment, support staff and site/side marked as required. Patient was prepped and draped in the usual sterile fashion.      Clinical Data: No additional findings.  ROS:  All other systems negative, except as noted in the HPI. Review of Systems  Objective: Vital Signs: Ht 5' 3" (1.6 m)   Wt 166 lb (75.3 kg)   BMI 29.41 kg/m   Specialty Comments:  No specialty comments available.  PMFS History: Patient Active Problem List   Diagnosis Date Noted  . Vaginal itching 11/10/2019  . Dysphagia 07/02/2019  . Screening examination for STD (sexually transmitted disease) 03/25/2019  . 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 Other chronic pain 12/03/2018  . Dizziness of  unknown cause 08/04/2018  . Tremor 07/15/2018  . Atrial flutter with rapid ventricular response ([place]) 07/10/2017  . Atrial fibrillation ([place]) 07/05/2017  . COPD (chronic obstructive pulmonary disease) ([place]) 01/22/2017  . Pedal edema   . Acute on chronic congestive heart failure ([place])   . Chronic bilateral low back pain without sciatica 08/15/2016  . 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 Schizoaffective disorder, bipolar type ([place])   . Osteoarthritis, multiple sites 06/08/2011  . Dysuria 02/14/2011  . CHRONIC KIDNEY DISEASE STAGE II (MILD) 09/14/2009  . Hypothyroidism 08/31/2006  . Type 2 diabetes mellitus ([place]) 08/31/2006  . HYPERCHOLESTEROLEMIA 08/31/2006  . Tobacco abuse 08/31/2006  . HYPERTENSION, BENIGN SYSTEMIC 08/31/2006   Past Medical History:  Diagnosis Date  . Arthritis    "real bad; all over" (07/12/2017)  . Asthma   . Atrial fibrillation ([place])   . BIPOLAR DISORDER 08/31/2006   Qualifier: Diagnosis of  By: Dorathy Daft MD, Marjory Lies    . CHRONIC KIDNEY DISEASE STAGE II (MILD) 09/14/2009   Annotation: eGFR 64 Qualifier: Diagnosis of  By: Jess Barters MD, [name]    . Chronic lower back pain   . Congestive heart failure (CHF) ([place])   . COPD (chronic obstructive pulmonary disease) ([place]) 09/23/2010   Diagnosed at [facility] in 2008 (Dr. Annamaria Boots)   . DM (diabetes mellitus) type II controlled with renal manifestation ([place]) 08/31/2006   Qualifier: Diagnosis of  By: Dorathy Daft MD, Marjory Lies    . Dyspnea    "all my life; since 6th grade" (07/12/2017)  . HYPERCHOLESTEROLEMIA 08/31/2006   Intolerance to Lipitor OK on Crestor but medicaid no longer covering    . HYPERTENSION, BENIGN SYSTEMIC 08/31/2006   Qualifier: Diagnosis of  By: Dorathy Daft MD, Marjory Lies    . Hypokalemia   . HYPOTHYROIDISM, UNSPECIFIED 08/31/2006   Qualifier: Diagnosis of  By: Dorathy Daft MD, Marjory Lies    . Leg swelling 03/14/2018  . Pneumonia    "3 times" (07/12/2017)  . Schizophrenia ([place])    . Scoliosis     Family History  Problem Relation Age of Onset  . Breast cancer Sister   . Heart disease Mother   . Rectal cancer Mother   . Diabetes Father   . Hypertension Father   . Heart disease Brother   . Asthma Daughter   . Asthma Son   . Breast cancer Sister   . Asthma Daughter   . Colon cancer Maternal Grandmother   . Stomach cancer Neg Hx   . Esophageal cancer Neg Hx   . Pancreatic cancer Neg Hx     Past Surgical History:  Procedure Laterality Date  . CESAREAN  SECTION    . FOOT FRACTURE SURGERY Right    "steel plate in it"  . FOOT SURGERY     " born w/dislocated foot"  . FRACTURE SURGERY    . KNEE ARTHROSCOPY Right   . TOE SURGERY Bilateral    "both pinky toes"  . TONSILLECTOMY AND ADENOIDECTOMY     Social History   Occupational History  . Not on file  Tobacco Use  . Smoking status: Current Some Day Smoker    Packs/day: 0.25    Years: 45.00    Pack years: 11.25    Types: Cigarettes  . Smokeless tobacco: Never Used  . Tobacco comment: patient states she smokes 1 cigarette per month  Substance and Sexual Activity  . Alcohol use: Yes    Comment: 07/12/2017 "stopped 2 yr ago; did have a drink over the holidays recently"  . Drug use: No  . Sexual activity: Not Currently

## 2019-11-18 NOTE — Telephone Encounter (Signed)
Patient is calling needing an order for a 4 prone cane. She's requesting it be sent in tomorrow to [facility] because the one she has now is throwing her off balance. Thanks

## 2019-11-20 ENCOUNTER — Other Ambulatory Visit: Payer: Self-pay

## 2019-11-20 ENCOUNTER — Encounter: Payer: Self-pay | Admitting: Family Medicine

## 2019-11-20 ENCOUNTER — Ambulatory Visit (INDEPENDENT_AMBULATORY_CARE_PROVIDER_SITE_OTHER): Payer: Medicare Other | Admitting: Family Medicine

## 2019-11-20 DIAGNOSIS — N182 Chronic kidney disease, stage 2 (mild): Secondary | ICD-10-CM

## 2019-11-20 DIAGNOSIS — E1122 Type 2 diabetes mellitus with diabetic chronic kidney disease: Secondary | ICD-10-CM | POA: Diagnosis not present

## 2019-11-20 MED ORDER — ACCU-CHEK SOFTCLIX LANCETS MISC
12 refills | Status: DC
Start: 2019-11-20 — End: 2019-11-21

## 2019-11-20 MED ORDER — ACCU-CHEK AVIVA PLUS W/DEVICE KIT: 1.0000 | PACK | Freq: Three times a day (TID) | 0 refills | Status: DC

## 2019-11-20 MED ORDER — ACCU-CHEK SMARTVIEW VI STRP: ORAL_STRIP | 12 refills | Status: DC

## 2019-11-20 MED ORDER — DAPAGLIFLOZIN PROPANEDIOL 5 MG PO TABS: 5.0000 mg | ORAL_TABLET | Freq: Every day | ORAL | 0 refills | Status: DC

## 2019-11-20 MED ORDER — ACCU-CHEK SOFTCLIX LANCETS MISC
12 refills | Status: DC
Start: 2019-11-20 — End: 2019-11-20

## 2019-11-20 NOTE — Progress Notes (Signed)
   CHIEF COMPLAINT / HPI: 69 year old female who presents for diabetes follow-up.  The patient was last seen on 11/01/2019.  Found to have a hemoglobin A1c of 12.2.  Patient states that she has had some blood sugars ranging from 3-400 at home.  She believes that her meter is broken because it has continuously displayed some sugars in the 400 range. I explained that this is possible because of her A1c result. Upon inspection of her meter it appears to be out of battery and will not turn on. The patient is very much against starting insulin and other injectables. Of note she has been receiving prednisone steroid injections by orthopedist for various musculoskeletal issues.  Patient would also like to get a four pronged walker as she feels that the single prong walker needs not helping her as much as she needs.  PERTINENT  PMH / PSH:    OBJECTIVE: BP (!) 158/90   Pulse 89   Wt 165 lb 3.2 oz (74.9 kg)   SpO2 98%   BMI 29.26 kg/m   Gen: 69 year old African-American female, no acute distress, she comfortably Resp: No respiratory distress, no accessory muscle use Neuro: Alert and oriented, Speech clear, No gross deficits   ASSESSMENT / PLAN:  Type 2 diabetes mellitus (HCC) A1c 12.2.  Recommended dietary changes.  I also stressed the importance of not getting prednisone injections or oral prednisone.  We will start Farxiga 5 mg, continue Metformin 1 g twice daily.  I gave her a new meter and sent in a new testing strips so that she can check her sugars.  Follow-up in 3 months for repeat A1c     Guadalupe Dawn, MD [place]

## 2019-11-20 NOTE — Patient Instructions (Addendum)
It was great seeing you again today!  I sent in a new glucose meter for you.  It is the preferred one for Medicaid.  On top of that I sent in the lancets and test strips as well.  I think starting a new oral medication called Wilder Glade is a good idea.  I checked the Medicaid formulary and this is the one that they will cover.  You will take this the next 3 months and come back to get your diabetes checked again.  I think that the chronic prednisone use from Dr. Jess Barters office is likely contributing to your high sugars.  I will send in a DME order for the 4-prong walker after you leave today.  Hopefully we will get you next week sometime.

## 2019-11-20 NOTE — Progress Notes (Signed)
Patient calls nurse line regarding rx for glucometer supplies. Supplies did not have instructions or diagnosis code, which are insurance requirements.  Updated rx to reflect needed diagnosis code and instructions.   Talbot Grumbling, RN

## 2019-11-21 ENCOUNTER — Other Ambulatory Visit: Payer: Self-pay

## 2019-11-21 ENCOUNTER — Ambulatory Visit (INDEPENDENT_AMBULATORY_CARE_PROVIDER_SITE_OTHER): Payer: Medicare Other | Admitting: Orthopedic Surgery

## 2019-11-21 ENCOUNTER — Telehealth: Payer: Self-pay

## 2019-11-21 ENCOUNTER — Encounter: Payer: Self-pay | Admitting: Orthopedic Surgery

## 2019-11-21 VITALS — Ht 63.0 in | Wt 165.0 lb

## 2019-11-21 DIAGNOSIS — M170 Bilateral primary osteoarthritis of knee: Secondary | ICD-10-CM

## 2019-11-21 DIAGNOSIS — M1711 Unilateral primary osteoarthritis, right knee: Secondary | ICD-10-CM

## 2019-11-21 MED ORDER — GLUCOSE BLOOD VI STRP: ORAL_STRIP | 0 refills | Status: DC

## 2019-11-21 MED ORDER — ACCU-CHEK AVIVA PLUS W/DEVICE KIT: 1.0000 | PACK | Freq: Three times a day (TID) | 0 refills | Status: DC

## 2019-11-21 MED ORDER — ONETOUCH VERIO W/DEVICE KIT: PACK | 0 refills | Status: DC

## 2019-11-21 MED ORDER — ONETOUCH DELICA LANCETS 33G MISC: 12 refills | Status: DC

## 2019-11-21 NOTE — Telephone Encounter (Signed)
Patient calls nurse line stating Medicare does not cover DM supplies sent in, with or without the frequency or Dx code. We were calling in Medicaid supplies, as patient stated earlier she was on Medicaid. Per [facility] DM sheet, I have called in medicare preferred supplies.

## 2019-11-21 NOTE — Telephone Encounter (Signed)
Patient calling nurse line in regards to DME order. I do not see one in orders. Please advise and let RN Team know to start processing.

## 2019-11-22 ENCOUNTER — Other Ambulatory Visit: Payer: Self-pay | Admitting: Family Medicine

## 2019-11-22 ENCOUNTER — Encounter: Payer: Self-pay | Admitting: Family Medicine

## 2019-11-22 ENCOUNTER — Encounter: Payer: Self-pay | Admitting: Orthopedic Surgery

## 2019-11-22 DIAGNOSIS — R2681 Unsteadiness on feet: Secondary | ICD-10-CM

## 2019-11-22 DIAGNOSIS — M1711 Unilateral primary osteoarthritis, right knee: Secondary | ICD-10-CM

## 2019-11-22 MED ORDER — HYALURONAN 88 MG/4ML IX SOSY
88.0000 mg | PREFILLED_SYRINGE | INTRA_ARTICULAR | Status: AC | PRN
  Administered 2019-11-22: 88 mg via INTRA_ARTICULAR

## 2019-11-22 MED ORDER — LIDOCAINE HCL 1 % IJ SOLN
1.0000 mL | INTRAMUSCULAR | Status: AC | PRN
  Administered 2019-11-22: 1 mL

## 2019-11-22 MED ORDER — METHYLPREDNISOLONE ACETATE 40 MG/ML IJ SUSP
40.0000 mg | INTRAMUSCULAR | Status: AC | PRN
  Administered 2019-11-22: 40 mg via INTRA_ARTICULAR

## 2019-11-22 NOTE — Telephone Encounter (Signed)
Placed order and routed to rn team. Please see orders only encounter for additional information  Guadalupe Dawn MD PGY-3 Family Medicine Resident

## 2019-11-22 NOTE — Assessment & Plan Note (Signed)
A1c 12.2.  Recommended dietary changes.  I also stressed the importance of not getting prednisone injections or oral prednisone.  We will start Farxiga 5 mg, continue Metformin 1 g twice daily.  I gave her a new meter and sent in a new testing strips so that she can check her sugars.  Follow-up in 3 months for repeat A1c

## 2019-11-22 NOTE — Progress Notes (Signed)
Office Visit Note   Patient: [name] Long           Date of Birth: 10-Apr-1951           MRN: [id number] Visit Date: 11/21/2019              Requested by: Guadalupe Dawn, MD [phone],  [place] [id number] PCP: Guadalupe Dawn, MD  Chief Complaint  Patient presents with  . Right Knee - Follow-up    [place]      HPI: Patient is a 69 year old woman who presents for a hyaluronic acid injection for her right knee.  Assessment & Plan: Visit Diagnoses:  1. Bilateral primary osteoarthritis of knee   2. Unilateral primary osteoarthritis, right knee     Plan: Recommended that she wean herself off the prednisone she is currently taking 10 mg with breakfast.  Follow-Up Instructions: Return if symptoms worsen or fail to improve.   Ortho Exam  Patient is alert, oriented, no adenopathy, well-dressed, normal affect, normal respiratory effort. Patient is seen for osteoarthritis of her right knee.  She has no effusion there is crepitation with range of motion.  Patient states that her prednisone is causing her blood sugars to be elevated.  Imaging: No results found. No images are attached to the encounter.  Labs: Lab Results  Component Value Date   HGBA1C 12.2 (A) 10/30/2019   HGBA1C 9.3 (A) 06/26/2019   HGBA1C 8.8 (A) 01/11/2019   LABORGA NO GROWTH 12/04/2014     Lab Results  Component Value Date   ALBUMIN 3.4 (L) 09/08/2018   ALBUMIN 3.3 (L) 12/06/2017   ALBUMIN 2.8 (L) 07/10/2017    Lab Results  Component Value Date   MG 1.7 12/25/2016   MG 1.8 06/10/2016   MG 1.8 02/02/2016   Lab Results  Component Value Date   VD25OH 43.0 02/23/2018   VD25OH 30 05/06/2015   VD25OH 12 (L) 12/18/2014    No results found for: PREALBUMIN CBC EXTENDED Latest Ref Rng & Units 09/08/2018 12/06/2017 12/06/2017  WBC 4.0 - 10.5 K/uL 7.1 - 6.2  RBC 3.87 - 5.11 MIL/uL 3.90 - 3.70(L)  HGB 12.0 - 15.0 g/dL 13.4 14.3 13.0  HCT 36.0 - 46.0 % 38.8 42.0 38.1  PLT 150 - 400  K/uL 191 - 178  NEUTROABS 1.7 - 7.7 K/uL - - 2.2  LYMPHSABS 0.7 - 4.0 K/uL - - 3.2     Body mass index is 29.23 kg/m.  Orders:  No orders of the defined types were placed in this encounter.  No orders of the defined types were placed in this encounter.    Procedures: Large Joint Inj: R knee on 11/22/2019 2:54 PM Indications: pain and diagnostic evaluation Details: 22 G 1.5 in needle, anteromedial approach  Arthrogram: No  Medications: 40 mg methylPREDNISolone acetate 40 MG/ML; 1 mL lidocaine 1 %; 88 mg Hyaluronan 88 MG/4ML Outcome: tolerated well, no immediate complications Procedure, treatment alternatives, risks and benefits explained, specific risks discussed. Consent was given by the patient. Immediately prior to procedure a time out was called to verify the correct patient, procedure, equipment, support staff and site/side marked as required. Patient was prepped and draped in the usual sterile fashion.      Clinical Data: No additional findings.  ROS:  All other systems negative, except as noted in the HPI. Review of Systems  Objective: Vital Signs: Ht '5\' 3"'  (1.6 m)   Wt 165 lb (74.8 kg)   BMI 29.23 kg/m  Specialty Comments:  No specialty comments available.  PMFS History: Patient Active Problem List   Diagnosis Date Noted  . Vaginal itching 11/10/2019  . Dysphagia 07/02/2019  . Screening examination for STD (sexually transmitted disease) 03/25/2019  . 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 Other chronic pain 12/03/2018  . Dizziness of unknown cause 08/04/2018  . Tremor 07/15/2018  . Atrial flutter with rapid ventricular response ([place]) 07/10/2017  . Atrial fibrillation ([place]) 07/05/2017  . COPD (chronic obstructive pulmonary disease) ([place]) 01/22/2017  . Pedal edema   . Acute on chronic congestive heart failure ([place])   . Chronic bilateral low back pain without sciatica 08/15/2016  . 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 Schizoaffective disorder, bipolar type ([place])   . Osteoarthritis, multiple sites 06/08/2011  . Dysuria 02/14/2011  . CHRONIC KIDNEY DISEASE STAGE II (MILD) 09/14/2009  . Hypothyroidism 08/31/2006  . Type 2 diabetes mellitus ([place]) 08/31/2006  . HYPERCHOLESTEROLEMIA 08/31/2006  . Tobacco abuse 08/31/2006  . HYPERTENSION, BENIGN SYSTEMIC 08/31/2006   Past Medical History:  Diagnosis Date  . Arthritis    "real bad; all over" (07/12/2017)  . Asthma   . Atrial fibrillation ([place])   . BIPOLAR DISORDER 08/31/2006   Qualifier: Diagnosis of  By: Dorathy Daft MD, Marjory Lies    . CHRONIC KIDNEY DISEASE STAGE II (MILD) 09/14/2009   Annotation: eGFR 64 Qualifier: Diagnosis of  By: Jess Barters MD, [name]    . Chronic lower back pain   . Congestive heart failure (CHF) ([place])   . COPD (chronic obstructive pulmonary disease) ([place]) 09/23/2010   Diagnosed at [facility] in 2008 (Dr. Annamaria Boots)   . DM (diabetes mellitus) type II controlled with renal manifestation ([place]) 08/31/2006   Qualifier: Diagnosis of  By: Dorathy Daft MD, Marjory Lies    . Dyspnea    "all my life; since 6th grade" (07/12/2017)  . HYPERCHOLESTEROLEMIA 08/31/2006   Intolerance to Lipitor OK on Crestor but medicaid no longer covering    . HYPERTENSION, BENIGN SYSTEMIC 08/31/2006   Qualifier: Diagnosis of  By: Dorathy Daft MD, Marjory Lies    . Hypokalemia   . HYPOTHYROIDISM, UNSPECIFIED 08/31/2006   Qualifier: Diagnosis of  By: Dorathy Daft MD, Marjory Lies    . Leg swelling 03/14/2018  . Pneumonia    "3 times" (07/12/2017)  . Schizophrenia ([place])   . Scoliosis     Family History  Problem Relation Age of Onset  . Breast cancer Sister   . Heart disease Mother   . Rectal cancer Mother   . Diabetes Father   . Hypertension Father   . Heart disease Brother   . Asthma Daughter   . Asthma Son   . Breast cancer Sister   . Asthma Daughter   . Colon cancer Maternal Grandmother   . Stomach cancer Neg Hx   . Esophageal cancer Neg Hx   . Pancreatic cancer Neg Hx      Past Surgical History:  Procedure Laterality Date  . CESAREAN SECTION    . FOOT FRACTURE SURGERY Right    "steel plate in it"  . FOOT SURGERY     " born w/dislocated foot"  . FRACTURE SURGERY    . KNEE ARTHROSCOPY Right   . TOE SURGERY Bilateral    "both pinky toes"  . TONSILLECTOMY AND ADENOIDECTOMY     Social History   Occupational History  . Not on file  Tobacco Use  . Smoking status: Current Some Day Smoker    Packs/day: 0.25  Years: 45.00    Pack years: 11.25    Types: Cigarettes  . Smokeless tobacco: Never Used  . Tobacco comment: patient states she smokes 1 cigarette per month  Substance and Sexual Activity  . Alcohol use: Yes    Comment: 07/12/2017 "stopped 2 yr ago; did have a drink over the holidays recently"  . Drug use: No  . Sexual activity: Not Currently

## 2019-11-23 ENCOUNTER — Ambulatory Visit: Payer: Medicare Other

## 2019-11-25 MED ORDER — ACCU-CHEK GUIDE W/DEVICE KIT: 1.0000 | PACK | Freq: Three times a day (TID) | 0 refills | Status: DC

## 2019-11-25 NOTE — Addendum Note (Signed)
Addended by: Pauletta Browns on: 11/25/2019 02:06 PM   Modules accepted: Orders

## 2019-11-26 ENCOUNTER — Telehealth: Payer: Self-pay | Admitting: Family Medicine

## 2019-11-26 NOTE — Telephone Encounter (Signed)
Patient would like for Dr. Kris Mouton to call her concerning her DM supplies. She said she has spoke with [employer] and they are not receiving the correct information.   The best call back number is [phone].

## 2019-12-06 ENCOUNTER — Other Ambulatory Visit: Payer: Self-pay | Admitting: Family Medicine

## 2019-12-06 ENCOUNTER — Other Ambulatory Visit: Payer: Self-pay

## 2019-12-06 ENCOUNTER — Telehealth: Payer: Self-pay

## 2019-12-06 NOTE — Telephone Encounter (Signed)
Patient calls nurse line requesting status of receiving DME order for 4-prong cane. Pulled order and sent community message to [facility]. Awaiting response.   Talbot Grumbling, RN

## 2019-12-07 ENCOUNTER — Telehealth: Payer: Self-pay | Admitting: Family Medicine

## 2019-12-07 NOTE — Telephone Encounter (Signed)
After-hours emergency line telephone encounter  Bobbiejo a Burklow is a pleasant 69 year old patient with type 2 diabetes.  She called the after-hours emergency line reporting that she does not have the proper materials for testing her blood sugar.  She says the last time she checked her blood sugar was several days ago and it was 300.  She recently received a new meter (one-step), reports she needs new test trips and lancets.  Patient reports she used to have some lancets but thought they would change with a new meter so she threw them out.   The patient denied having any headaches, vision changes, worsening chest pain or shortness of breath  [facility] [address]  I called the pharmacy at [phone] to make sure the correct orders were placed. Unfortunately, orders on May 20 were placed for One Touch Deluca lancets and "glucose blood test strips" order to be used to check blood sugar 4 times a day, diagnosis code E11.22 was given for both of these orders.  Then on May 24 another order for "blood glucose monitoring supplies (Accu-Chek guide) with device kit" was ordered with instructions "4 times daily, before meals and at bedtime".  In speaking with the pharmacy, the order for the lancets and strips has been put on hold by Medicare until sometime later in June since the patient was not due for any of these new supplies.  However, patient has a new glucometer now (patient has a one-step glucometer.)  Forwarding to patient's PCP to address this with insurance, the pharmacy.  Patient given strict return precautions.  Milus Banister, [place], PGY-2 12/07/2019 6:46 PM

## 2019-12-09 NOTE — Telephone Encounter (Signed)
After Dr. Tonette Bihari after hours call it is still unclear to me which supplies this patient needs. Can the RN team assist me in is possibly filling out a prior auth for the non-formulary supplies? I know that medicare part B will usually cover the supplies but if they are unwilling to refill them this quickly I wonder if we could switch and use her medicaid instead. I am certainly open to any other ideas to get this patient her supplies if these are not realistic options.  Guadalupe Dawn MD PGY-3 Family Medicine Resident

## 2019-12-10 ENCOUNTER — Telehealth (INDEPENDENT_AMBULATORY_CARE_PROVIDER_SITE_OTHER): Payer: Medicare Other | Admitting: Student in an Organized Health Care Education/Training Program

## 2019-12-10 ENCOUNTER — Other Ambulatory Visit: Payer: Self-pay

## 2019-12-10 DIAGNOSIS — B373 Candidiasis of vulva and vagina: Secondary | ICD-10-CM

## 2019-12-10 DIAGNOSIS — B3731 Acute candidiasis of vulva and vagina: Secondary | ICD-10-CM | POA: Insufficient documentation

## 2019-12-10 MED ORDER — FLUCONAZOLE 150 MG PO TABS
ORAL_TABLET | ORAL | 0 refills | Status: DC
Start: 2019-12-10 — End: 2020-04-05

## 2019-12-10 MED ORDER — MICONAZOLE NITRATE 2 % EX CREA
1.0000 | TOPICAL_CREAM | Freq: Two times a day (BID) | CUTANEOUS | 0 refills | Status: DC
Start: 2019-12-10 — End: 2020-02-17

## 2019-12-10 NOTE — Telephone Encounter (Signed)
I called the patients pharmacy to see what could be done in regards to test strips and lancets. Unfortunately, patient threw out her supplies, therefore insurance will not pay again until the end of June. Patient will have to pay out of pocket.

## 2019-12-10 NOTE — Progress Notes (Signed)
[place] [facility] Telemedicine Visit  Patient consented to have virtual visit and was identified by name and date of birth. Method of visit: Video was attempted, but technology challenges prevented patient from using video, so visit was conducted via telephone.  Encounter participants: Patient: [name] Long - located at home Provider: Richarda Osmond - located at [facility] Others (if applicable): none  Chief Complaint: vaginal itching  HPI: 69 year old female who has recurrent BV and yeast infections is complaining of severe vaginal irritation similar to previous yeast infections.  Initial symptoms began about 2 months ago and was treated for BV but has also received antibiotics for surgeries and sinus infections in that time.  Symptoms are worsening at this time despite using over-the-counter topical antiitch creams and Neosporin.  She cannot help but rub and scratch the areas of irritation which have led to breakdown of the skin and some mild bleeding of the skin.  Endorses a small amount of liquid discharge and foul odor.  Denies dysuria, frequency, urgency, vaginal bleeding. Patient is also a poorly controlled diabetic which increases her risk of yeast infection.  Completed COVID vaccinations.   ROS: per HPI  Exam:  Ht 5\' 3"  (1.6 m)   Wt 165 lb (74.8 kg) Comment: 5/20 was last wt recorded, SS  BMI 29.23 kg/m   Respiratory: Normal  Assessment/Plan:  Vagina, candidiasis Suspected via symptoms on virtual visit, recent antibiotic use, and similar symptoms in previous yeast infections. -Prescribed Diflucan 1 now and again in 48 hours if not improved -Prescribed topical miconazole -Recommended that if symptoms are not improved by the end of the week patient make an appointment to be examined and have wet prep collected.  She was in agreement with this plan.    Time spent during visit with patient: 9 minutes

## 2019-12-10 NOTE — Assessment & Plan Note (Signed)
Suspected via symptoms on virtual visit, recent antibiotic use, and similar symptoms in previous yeast infections. -Prescribed Diflucan 1 now and again in 48 hours if not improved -Prescribed topical miconazole -Recommended that if symptoms are not improved by the end of the week patient make an appointment to be examined and have wet prep collected.  She was in agreement with this plan.

## 2019-12-12 ENCOUNTER — Telehealth: Payer: Self-pay | Admitting: Orthopedic Surgery

## 2019-12-12 ENCOUNTER — Other Ambulatory Visit: Payer: Self-pay

## 2019-12-12 MED ORDER — PREDNISONE 10 MG PO TABS: 20.0000 mg | ORAL_TABLET | Freq: Every day | ORAL | 0 refills | Status: DC

## 2019-12-12 NOTE — Telephone Encounter (Signed)
Ok per Dr. Sharol Given to refill prednisone.

## 2019-12-12 NOTE — Telephone Encounter (Signed)
Patient called requesting to be put back on the Prednisone.  She also stated that the weather makes her pain worse and found out that the Prednisone is not raising her sugar, it is the stress, what she is eating and drinking.  She is also requesting an RX refill on her Hydrocodone.  [facility] [address]  CB#[phone].  Thank you.

## 2019-12-13 ENCOUNTER — Ambulatory Visit (INDEPENDENT_AMBULATORY_CARE_PROVIDER_SITE_OTHER): Payer: Medicare Other | Admitting: Family Medicine

## 2019-12-13 ENCOUNTER — Other Ambulatory Visit: Payer: Self-pay

## 2019-12-13 ENCOUNTER — Telehealth: Payer: Self-pay | Admitting: Orthopedic Surgery

## 2019-12-13 ENCOUNTER — Encounter: Payer: Self-pay | Admitting: Family Medicine

## 2019-12-13 VITALS — BP 138/88 | HR 78 | Ht 63.0 in | Wt 163.8 lb

## 2019-12-13 DIAGNOSIS — R81 Glycosuria: Secondary | ICD-10-CM

## 2019-12-13 DIAGNOSIS — N898 Other specified noninflammatory disorders of vagina: Secondary | ICD-10-CM

## 2019-12-13 DIAGNOSIS — N952 Postmenopausal atrophic vaginitis: Secondary | ICD-10-CM | POA: Diagnosis not present

## 2019-12-13 LAB — POCT WET PREP (WET MOUNT)
Clue Cells Wet Prep Whiff POC: NEGATIVE
Trichomonas Wet Prep HPF POC: ABSENT

## 2019-12-13 LAB — POCT URINALYSIS DIP (MANUAL ENTRY)
Bilirubin, UA: NEGATIVE
Blood, UA: NEGATIVE
Glucose, UA: >=1000 mg/dL — AB
Ketones, POC UA: NEGATIVE mg/dL
Leukocytes, UA: NEGATIVE
Nitrite, UA: NEGATIVE
Protein Ur, POC: 30 mg/dL — AB
Spec Grav, UA: 1.015 (ref 1.010–1.025)
Urobilinogen, UA: 0.2 E.U./dL
pH, UA: 5.5 (ref 5.0–8.0)

## 2019-12-13 LAB — POCT UA - MICROSCOPIC ONLY

## 2019-12-13 LAB — GLUCOSE, POCT (MANUAL RESULT ENTRY): POC Glucose: 284 mg/dl — AB (ref 70–99)

## 2019-12-13 MED ORDER — ESTRADIOL 0.1 MG/GM VA CREA: TOPICAL_CREAM | VAGINAL | 12 refills | Status: AC

## 2019-12-13 NOTE — Patient Instructions (Addendum)
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  1-14 days: Please use a HALF applicator full of vaginal cream every night  After 14 days, you can change treatment to 1-3 times a week depending on your symptoms.  If you are still having itching, please let us know as we can change the dose of the vaginal cream.  Please make a follow up appointment in one month for diabetes check, especially since you are on long term prednisone.  I have provided information about atrophic vaginitis as well as the vaginal cream in this packet.  Please read them thoroughly.  Topical estrogen can increase your risk of blood clots.  It does not appear that you are on any anticoagulation for atrial fibrillation.  Please know that using this estrogen can increase risk of clotting.  If you experience a red, hot, swollen leg or sudden difficulty breathing, please go to the emergency room immediately.   Atrophic Vaginitis Atrophic vaginitis is a condition in which the tissues that line the vagina become dry and thin. This condition occurs in women who have stopped having their period. It is caused by a drop in a female hormone (estrogen). This hormone helps:  To keep the vagina moist.  To make a clear fluid. This clear fluid helps: ? To make the vagina ready for sex. ? To protect the vagina from infection. If the lining of the vagina is dry and thin, it may cause irritation, burning, or itchiness. It may also:  Make sex painful.  Make an exam of your vagina painful.  Cause bleeding.  Make you lose interest in sex.  Cause a burning feeling when you pee (urinate).  Cause a brown or yellow fluid to come from your vagina. Some women do not have symptoms. Follow these instructions at home: Medicines  Take over-the-counter and prescription medicines only as told by your doctor.  Do not use herbs or other medicines unless your doctor says  it is okay.  Use medicines for for dryness. These include: ? Oils to make the vagina soft. ? Creams. ? Moisturizers. General instructions  Do not douche.  Do not use products that can make your vagina dry. These include: ? Scented sprays. ? Scented tampons. ? Scented soaps.  Sex can help increase blood flow and soften the tissue in the vagina. If it hurts to have sex: ? Tell your partner. ? Use products to make sex more comfortable. Use these only as told by your doctor. Contact a doctor if you:  Have discharge from the vagina that is different than usual.  Have a bad smell coming from your vagina.  Have new symptoms.  Do not get better.  Get worse. Summary  Atrophic vaginitis is a condition in which the lining of the vagina becomes dry and thin.  This condition affects women who have stopped having their periods.  Treatment may include using products that help make the vagina soft.  Call a doctor if do not get better with treatment. This information is not intended to replace advice given to you by your health care provider. Make sure you discuss any questions you have with your health care provider. Document Revised: 07/03/2017 Document Reviewed: 07/03/2017 Elsevier Patient Education  [place]. Conjugated Estrogens vaginal cream What is this medicine? CONJUGATED ESTROGENS (CON ju gate ed ESS troe jenz) are a mixture of female hormones. This cream can help relieve symptoms associated with menopause.like  vaginal dryness and irritation. This medicine may be used for other purposes; ask your health care provider or pharmacist if you have questions. COMMON BRAND NAME(S): Premarin What should I tell my health care provider before I take this medicine? They need to know if you have any of these conditions:  abnormal vaginal bleeding  blood vessel disease or blood clots  breast, cervical, endometrial, or uterine cancer  dementia  diabetes  gallbladder  disease  heart disease or recent heart attack  high blood pressure  high cholesterol  high level of calcium in the blood  hysterectomy  kidney disease  liver disease  migraine headaches  protein C deficiency  protein S deficiency  stroke  systemic lupus erythematosus (SLE)  tobacco smoker  an unusual or allergic reaction to estrogens other medicines, foods, dyes, or preservatives  pregnant or trying to get pregnant  breast-feeding How should I use this medicine? This medicine is for use in the vagina only. Do not take by mouth. Follow the directions on the prescription label. Use at bedtime unless otherwise directed by your doctor or health care professional. Use the special applicator supplied with the cream. Wash hands before and after use. Fill the applicator with the cream and remove from the tube. Lie on your back, part and bend your knees. Insert the applicator into the vagina and push the plunger to expel the cream into the vagina. Wash the applicator with warm soapy water and rinse well. Use exactly as directed for the complete length of time prescribed. Do not stop using except on the advice of your doctor or health care professional. Talk to your pediatrician regarding the use of this medicine in children. Special care may be needed. A patient package insert for the product will be given with each prescription and refill. Read this sheet carefully each time. The sheet may change frequently. Overdosage: If you think you have taken too much of this medicine contact a poison control center or emergency room at once. NOTE: This medicine is only for you. Do not share this medicine with others. What if I miss a dose? If you miss a dose, use it as soon as you can. If it is almost time for your next dose, use only that dose. Do not use double or extra doses. What may interact with this medicine? Do not take this medicine with any of the following medications:  aromatase  inhibitors like aminoglutethimide, anastrozole, exemestane, letrozole, testolactone This medicine may also interact with the following medications:  barbiturates used for inducing sleep or treating seizures  carbamazepine  grapefruit juice  medicines for fungal infections like itraconazole and ketoconazole  raloxifene or tamoxifen  rifabutin  rifampin  rifapentine  ritonavir  some antibiotics used to treat infections  St. John's Wort  warfarin This list may not describe all possible interactions. Give your health care provider a list of all the medicines, herbs, non-prescription drugs, or dietary supplements you use. Also tell them if you smoke, drink alcohol, or use illegal drugs. Some items may interact with your medicine. What should I watch for while using this medicine? Visit your health care professional for regular checks on your progress. You will need a regular breast and pelvic exam. You should also discuss the need for regular mammograms with your health care professional, and follow his or her guidelines. This medicine can make your body retain fluid, making your fingers, hands, or ankles swell. Your blood pressure can go up. Contact your doctor or health  care professional if you feel you are retaining fluid. If you have any reason to think you are pregnant; stop taking this medicine at once and contact your doctor or health care professional. Tobacco smoking increases the risk of getting a blood clot or having a stroke, especially if you are more than 68 years old. You are strongly advised not to smoke. If you wear contact lenses and notice visual changes, or if the lenses begin to feel uncomfortable, consult your eye care specialist. If you are going to have elective surgery, you may need to stop taking this medicine beforehand. Consult your health care professional for advice prior to scheduling the surgery. What side effects may I notice from receiving this  medicine? Side effects that you should report to your doctor or health care professional as soon as possible:  allergic reactions like skin rash, itching or hives, swelling of the face, lips, or tongue  breast tissue changes or discharge  changes in vision  chest pain  confusion, trouble speaking or understanding  dark urine  general ill feeling or flu-like symptoms  light-colored stools  nausea, vomiting  pain, swelling, warmth in the leg  right upper belly pain  severe headaches  shortness of breath  sudden numbness or weakness of the face, arm or leg  trouble walking, dizziness, loss of balance or coordination  unusual vaginal bleeding  yellowing of the eyes or skin Side effects that usually do not require medical attention (report to your doctor or health care professional if they continue or are bothersome):  hair loss  increased hunger or thirst  increased urination  symptoms of vaginal infection like itching, irritation or unusual discharge  unusually weak or tired This list may not describe all possible side effects. Call your doctor for medical advice about side effects. You may report side effects to FDA at 1-800-FDA-1088. Where should I keep my medicine? Keep out of the reach of children. Store at room temperature between 15 and 30 degrees C (59 and 86 degrees F). Throw away any unused medicine after the expiration date. NOTE: This sheet is a summary. It may not cover all possible information. If you have questions about this medicine, talk to your doctor, pharmacist, or health care provider.  2020 Elsevier/Gold Standard (2010-09-22 09:20:36)

## 2019-12-13 NOTE — Telephone Encounter (Signed)
Patient called needing Rx refilled Hydrocodone. Patient said she will not see Dr Sharol Given until November. Patient said she will be waiting for Rx so she can pick everything up at the same time. Patient said she has been calling since yesterday.  The number to contact patient is [phone]

## 2019-12-13 NOTE — Progress Notes (Signed)
    SUBJECTIVE:   CHIEF COMPLAINT / HPI:   Vaginal itching  Two monts ago, treated for BV and yeast.  Patient reports that whenever she has had vaginal itching, amoxicillin is usually what makes it feel better.  She reports that this is very distressing to her.  PERTINENT  PMH / PSH: Atrial fibrillation, not on anticoagulation, diabetes, hypothyroidism, CKD 2  OBJECTIVE:   BP 138/88   Pulse 78   Ht 5\' 3"  (1.6 m)   Wt 163 lb 12.8 oz (74.3 kg)   SpO2 99%   BMI 29.02 kg/m   General: Well-appearing female, mild distress GU: External vulva and vagina erythematous and swollen appearing. Tender to palpation. No obvious rash appreciated. No abnormal discharge appreciated.  Decreased ruggae of vaginal walls.  Cervix is non erythematous and non-friable.  There is no cervical motion tenderness, masses or gross abnormalities appreciated during bimanual exam.    ASSESSMENT/PLAN:   Post-menopausal atrophic vaginitis Patient's wet prep is negative except for bacteria.  She reports no new sexual contacts.  Reports that this is never happened before.  This is likely atrophic vaginitis given chronicity over the last few months without improvement with other treatments.  Will provide topical estrogen therapy.  See AVS for specific instructions.  Also reviewed patient information packet about atrophic vaginitis and topical estrogen use.  Explained to patient the increased risk of blood clots (she is not on any anticoagulation for her atrial fibrillation).  Return precautions provided.  Follow-up in 2 to 4 weeks   Glucosuria expected in setting of [place], MD [place]

## 2019-12-13 NOTE — Telephone Encounter (Signed)
I called and advised pt that the message had been relayed to Dr. Sharol Given and he refilled the prednisone but not the narcotic its not good to use for long term. Was on the phone with the pt for several minutes trying to explain why the rx was not filled. Pt asked "don't you know who I am" I advised the pt that I do know who she is. Pt asked for an appt so that she can discuss this with Dr. Sharol Given face to face. Appt made for Thursday

## 2019-12-13 NOTE — Telephone Encounter (Signed)
Pt called stating she would like to speak with Dr. Sharol Given regarding being discontinued for a medication; pt states she doesn't want to speak with anymore nurses or PA's.   [phone]

## 2019-12-13 NOTE — Assessment & Plan Note (Signed)
Patient's wet prep is negative except for bacteria.  She reports no new sexual contacts.  Reports that this is never happened before.  This is likely atrophic vaginitis given chronicity over the last few months without improvement with other treatments.  Will provide topical estrogen therapy.  See AVS for specific instructions.  Also reviewed patient information packet about atrophic vaginitis and topical estrogen use.  Explained to patient the increased risk of blood clots (she is not on any anticoagulation for her atrial fibrillation).  Return precautions provided.  Follow-up in 2 to 4 weeks

## 2019-12-16 ENCOUNTER — Telehealth: Payer: Self-pay | Admitting: Orthopedic Surgery

## 2019-12-16 NOTE — Telephone Encounter (Signed)
Made in error

## 2019-12-16 NOTE — Telephone Encounter (Signed)
I called pt and made an appt for Thursday with Dr. Sharol Given and she can discuss pain management at that time.

## 2019-12-16 NOTE — Telephone Encounter (Signed)
I called and moved her appt up to tomorrow with Dr. Sharol Given

## 2019-12-16 NOTE — Telephone Encounter (Signed)
Patient called.   She needs something for her pain and inflammation to hold her until her Thursday appointment. She also wants to know if she could be worked in sooner than Thursday.   Call back: [phone]

## 2019-12-17 ENCOUNTER — Encounter: Payer: Self-pay | Admitting: Orthopedic Surgery

## 2019-12-17 ENCOUNTER — Ambulatory Visit (INDEPENDENT_AMBULATORY_CARE_PROVIDER_SITE_OTHER): Payer: Medicare Other | Admitting: Orthopedic Surgery

## 2019-12-17 ENCOUNTER — Other Ambulatory Visit: Payer: Self-pay

## 2019-12-17 VITALS — Ht 63.0 in | Wt 163.0 lb

## 2019-12-17 DIAGNOSIS — M170 Bilateral primary osteoarthritis of knee: Secondary | ICD-10-CM | POA: Diagnosis not present

## 2019-12-17 DIAGNOSIS — M545 Low back pain, unspecified: Secondary | ICD-10-CM

## 2019-12-17 DIAGNOSIS — M542 Cervicalgia: Secondary | ICD-10-CM | POA: Diagnosis not present

## 2019-12-17 DIAGNOSIS — G8929 Other chronic pain: Secondary | ICD-10-CM | POA: Diagnosis not present

## 2019-12-17 MED ORDER — HYDROCODONE-ACETAMINOPHEN 5-325 MG PO TABS: 1.0000 | ORAL_TABLET | Freq: Four times a day (QID) | ORAL | 0 refills | Status: DC | PRN

## 2019-12-17 NOTE — Progress Notes (Signed)
Office Visit Note   Patient: [name] Long           Date of Birth: 11-27-1950           MRN: [id number] Visit Date: 12/17/2019              Requested by: Guadalupe Dawn, MD [phone],  [place] [address] PCP: Guadalupe Dawn, MD  Chief Complaint  Patient presents with  . Right Knee - Pain  . Left Knee - Pain      HPI: Patient is a 69 year old woman who presents in follow-up for chronic pain in her neck back and both knees.  She states the hyaluronic acid injection in both knees gave her good relief.  Patient states she is currently taking 2 prednisone with breakfast and taking Aleve during the day.  Patient states that she has pain walking from her bedroom to the kitchen she states she has tried physical therapy in the past but could not finish it secondary to pain.  Patient states she has lower back spasms that kept her in bed for 2 weeks.  Patient states that she had to fire her aide she does not have help at home she states that she has to cook and clean on her own.  Patient states she was in pain management before but was discharged 3 years ago.  Assessment & Plan: Visit Diagnoses:  1. Bilateral primary osteoarthritis of knee   2. Neck pain   3. Chronic bilateral low back pain without sciatica     Plan: Discussed with the patient importance of not taking nonsteroidals with a steroid.  Recommend that she decrease the prednisone to 1 tablet with breakfast and not take the Aleve.  Recommended exercise and discussed that we could provide 2 last prescriptions for narcotics and after that we could not fill any narcotics through this office.  Patient states she understands.  Follow-Up Instructions: Return if symptoms worsen or fail to improve.   Ortho Exam  Patient is alert, oriented, no adenopathy, well-dressed, normal affect, normal respiratory effort.  Examination patient has no radicular symptoms in either upper or lower extremity.  She has an antalgic  gait uses a cane she has a negative straight leg raise bilaterally.  No focal motor weakness in either upper or lower extremities.  Patient states she is healthy without medical problems her most recent hemoglobin A1c was 12.2.   Imaging: No results found. No images are attached to the encounter.  Labs: Lab Results  Component Value Date   HGBA1C 12.2 (A) 10/30/2019   HGBA1C 9.3 (A) 06/26/2019   HGBA1C 8.8 (A) 01/11/2019   LABORGA NO GROWTH 12/04/2014     Lab Results  Component Value Date   ALBUMIN 3.4 (L) 09/08/2018   ALBUMIN 3.3 (L) 12/06/2017   ALBUMIN 2.8 (L) 07/10/2017    Lab Results  Component Value Date   MG 1.7 12/25/2016   MG 1.8 06/10/2016   MG 1.8 02/02/2016   Lab Results  Component Value Date   VD25OH 43.0 02/23/2018   VD25OH 30 05/06/2015   VD25OH 12 (L) 12/18/2014    No results found for: PREALBUMIN CBC EXTENDED Latest Ref Rng & Units 09/08/2018 12/06/2017 12/06/2017  WBC 4.0 - 10.5 K/uL 7.1 - 6.2  RBC 3.87 - 5.11 MIL/uL 3.90 - 3.70(L)  HGB 12.0 - 15.0 g/dL 13.4 14.3 13.0  HCT 36 - 46 % 38.8 42.0 38.1  PLT 150 - 400 K/uL 191 - 178  NEUTROABS 1.7 - 7.7 K/uL - - 2.2  LYMPHSABS 0.7 - 4.0 K/uL - - 3.2     Body mass index is 28.87 kg/m.  Orders:  No orders of the defined types were placed in this encounter.  No orders of the defined types were placed in this encounter.    Procedures: No procedures performed  Clinical Data: No additional findings.  ROS:  All other systems negative, except as noted in the HPI. Review of Systems  Objective: Vital Signs: Ht 5' 3" (1.6 m)   Wt 163 lb (73.9 kg)   BMI 28.87 kg/m   Specialty Comments:  No specialty comments available.  PMFS History: Patient Active Problem List   Diagnosis Date Noted  . Post-menopausal atrophic vaginitis 12/13/2019  . Vagina, candidiasis 12/10/2019  . Vaginal itching 11/10/2019  . Dysphagia 07/02/2019  . Screening examination for STD (sexually transmitted disease)  03/25/2019  . 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 Other chronic pain 12/03/2018  . Dizziness of unknown cause 08/04/2018  . Tremor 07/15/2018  . Atrial flutter with rapid ventricular response ([place]) 07/10/2017  . Atrial fibrillation ([place]) 07/05/2017  . COPD (chronic obstructive pulmonary disease) ([place]) 01/22/2017  . Pedal edema   . Acute on chronic congestive heart failure ([place])   . Chronic bilateral low back pain without sciatica 08/15/2016  . 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 Schizoaffective disorder, bipolar type ([place])   . Osteoarthritis, multiple sites 06/08/2011  . Dysuria 02/14/2011  . CHRONIC KIDNEY DISEASE STAGE II (MILD) 09/14/2009  . Hypothyroidism 08/31/2006  . Type 2 diabetes mellitus ([place]) 08/31/2006  . HYPERCHOLESTEROLEMIA 08/31/2006  . Tobacco abuse 08/31/2006  . HYPERTENSION, BENIGN SYSTEMIC 08/31/2006   Past Medical History:  Diagnosis Date  . Arthritis    "real bad; all over" (07/12/2017)  . Asthma   . Atrial fibrillation ([place])   . BIPOLAR DISORDER 08/31/2006   Qualifier: Diagnosis of  By: Dorathy Daft MD, Marjory Lies    . CHRONIC KIDNEY DISEASE STAGE II (MILD) 09/14/2009   Annotation: eGFR 64 Qualifier: Diagnosis of  By: Jess Barters MD, [name]    . Chronic lower back pain   . Congestive heart failure (CHF) ([place])   . COPD (chronic obstructive pulmonary disease) ([place]) 09/23/2010   Diagnosed at [facility] in 2008 (Dr. Annamaria Boots)   . DM (diabetes mellitus) type II controlled with renal manifestation ([place]) 08/31/2006   Qualifier: Diagnosis of  By: Dorathy Daft MD, Marjory Lies    . Dyspnea    "all my life; since 6th grade" (07/12/2017)  . HYPERCHOLESTEROLEMIA 08/31/2006   Intolerance to Lipitor OK on Crestor but medicaid no longer covering    . HYPERTENSION, BENIGN SYSTEMIC 08/31/2006   Qualifier: Diagnosis of  By: Dorathy Daft MD, Marjory Lies    . Hypokalemia   . HYPOTHYROIDISM, UNSPECIFIED 08/31/2006   Qualifier: Diagnosis of  By: Dorathy Daft MD, Marjory Lies     . Leg swelling 03/14/2018  . Pneumonia    "3 times" (07/12/2017)  . Schizophrenia ([place])   . Scoliosis     Family History  Problem Relation Age of Onset  . Breast cancer Sister   . Heart disease Mother   . Rectal cancer Mother   . Diabetes Father   . Hypertension Father   . Heart disease Brother   . Asthma Daughter   . Asthma Son   . Breast cancer Sister   . Asthma Daughter   . Colon cancer Maternal Grandmother   . Stomach cancer Neg Hx   .  Esophageal cancer Neg Hx   . Pancreatic cancer Neg Hx     Past Surgical History:  Procedure Laterality Date  . CESAREAN SECTION    . FOOT FRACTURE SURGERY Right    "steel plate in it"  . FOOT SURGERY     " born w/dislocated foot"  . FRACTURE SURGERY    . KNEE ARTHROSCOPY Right   . TOE SURGERY Bilateral    "both pinky toes"  . TONSILLECTOMY AND ADENOIDECTOMY     Social History   Occupational History  . Not on file  Tobacco Use  . Smoking status: Current Some Day Smoker    Packs/day: 0.25    Years: 45.00    Pack years: 11.25    Types: Cigarettes  . Smokeless tobacco: Never Used  . Tobacco comment: patient states she smokes 1 cigarette per month  Vaping Use  . Vaping Use: Never used  Substance and Sexual Activity  . Alcohol use: Yes    Comment: 07/12/2017 "stopped 2 yr ago; did have a drink over the holidays recently"  . Drug use: No  . Sexual activity: Not Currently

## 2019-12-19 ENCOUNTER — Ambulatory Visit: Payer: Medicare Other | Admitting: Orthopedic Surgery

## 2019-12-20 ENCOUNTER — Other Ambulatory Visit: Payer: Self-pay | Admitting: *Deleted

## 2019-12-23 MED ORDER — POTASSIUM CHLORIDE CRYS ER 10 MEQ PO TBCR: 10.0000 meq | EXTENDED_RELEASE_TABLET | Freq: Every day | ORAL | 3 refills | Status: DC

## 2019-12-25 ENCOUNTER — Ambulatory Visit: Payer: Medicare Other | Admitting: Family Medicine

## 2019-12-26 ENCOUNTER — Other Ambulatory Visit: Payer: Self-pay

## 2019-12-26 ENCOUNTER — Ambulatory Visit: Payer: Medicare Other | Admitting: Family Medicine

## 2019-12-26 ENCOUNTER — Ambulatory Visit (INDEPENDENT_AMBULATORY_CARE_PROVIDER_SITE_OTHER): Payer: Medicare Other | Admitting: Family Medicine

## 2019-12-26 ENCOUNTER — Telehealth: Payer: Self-pay | Admitting: Internal Medicine

## 2019-12-26 DIAGNOSIS — R1310 Dysphagia, unspecified: Secondary | ICD-10-CM

## 2019-12-26 DIAGNOSIS — K219 Gastro-esophageal reflux disease without esophagitis: Secondary | ICD-10-CM | POA: Diagnosis not present

## 2019-12-26 DIAGNOSIS — N182 Chronic kidney disease, stage 2 (mild): Secondary | ICD-10-CM | POA: Diagnosis not present

## 2019-12-26 DIAGNOSIS — E1122 Type 2 diabetes mellitus with diabetic chronic kidney disease: Secondary | ICD-10-CM

## 2019-12-26 DIAGNOSIS — F250 Schizoaffective disorder, bipolar type: Secondary | ICD-10-CM

## 2019-12-26 MED ORDER — DAPAGLIFLOZIN PROPANEDIOL 5 MG PO TABS: 10.0000 mg | ORAL_TABLET | Freq: Every day | ORAL | 0 refills | Status: DC

## 2019-12-26 MED ORDER — FAMOTIDINE 40 MG PO TABS: 40.0000 mg | ORAL_TABLET | Freq: Every day | ORAL | 0 refills | Status: DC

## 2019-12-26 NOTE — Patient Instructions (Signed)
It was great seeing you today! I did recommend going up to 10 mg or Farxiga, but given the side effects he did not want to do this. Please correct and see me in a couple months for another A1c check. On top of that I did switch your omeprazole to Pepcid. Lastly please let me know if you are ever interested in referral to psychiatry, I do not feel comfortable managing your anxiety in the context of a bipolar schizoaffective disorder diagnosis.

## 2019-12-26 NOTE — Telephone Encounter (Signed)
Patient calling states she can not breathe and she went to pcp they requested she call us. She said her condition is worst is afraid to sleep and stay without air because her throat feels like it closes on her.

## 2019-12-26 NOTE — Telephone Encounter (Signed)
Pt states her PCP told her to call Dr. Hilarie Fredrickson because she is "ejecting fluid again." States when she burps the fluid is coming back up in her throat. Reports she is also feeling the food get stuck in her thyroid under her breast bone. Referral was made for her to see pulmonary on 11/06/19, appt had not been made. Office contacted and they have now scheduled her to see Dr. Valeta Harms 8/5@9 :30am. Please advise regarding esophageal issues.

## 2019-12-27 ENCOUNTER — Encounter: Payer: Self-pay | Admitting: Family Medicine

## 2019-12-27 ENCOUNTER — Encounter: Payer: Self-pay | Admitting: Pulmonary Disease

## 2019-12-27 ENCOUNTER — Telehealth: Payer: Self-pay | Admitting: Family Medicine

## 2019-12-27 ENCOUNTER — Telehealth: Payer: Self-pay | Admitting: Internal Medicine

## 2019-12-27 DIAGNOSIS — K219 Gastro-esophageal reflux disease without esophagitis: Secondary | ICD-10-CM | POA: Insufficient documentation

## 2019-12-27 NOTE — Telephone Encounter (Signed)
This may be reflux, I had her on omeprazole 40 mg twice daily AC This is now off of her med list I would recommend she be taking twice daily PPI to see if symptoms improve before we further evaluate If she resumes twice daily omeprazole and we can stop the Pepcid

## 2019-12-27 NOTE — Progress Notes (Signed)
CHIEF COMPLAINT / HPI: 69 year old female who presents for a variety of issues.  Patient can be a difficult historian as several things discussed during the visit are not consistent with those found in her chart.  For her diabetes patient states that the [place] gave her a "funny feeling".  She had a difficult time further describing this sensation but states it was enough to keep her from taking the medication.  She states that she feels that her diabetes is well controlled now that she has stopped eating sugary foods and is no longer drinking sodas containing low sugar.  Of note her last A1c was 12.2.  The patient is not interested in switching the Farxiga to another medication or starting any new diabetes medications.  States that her sugars have been running good at home, but cannot tell me the values of those checks.  Patient states that she had been suffering from significant anxiety.  She states that she has these emotions around the time of the year every year, because of the passing of her father.  She states that she is just undergoing grief but would like a medication to help it.  Of note patient has a long psychiatric history for which she is seen [facility] and specialty care in the past.  Diagnosis of bipolar 1 disorder as well as schizoaffective disorder with bipolar features are documented in her chart.  Currently takes risperidone.  I explained that while an SSRI would likely be first-line for issues, her history of bipolar disorder makes this contraindicated.  Further explained that I would prefer to refer to a specialist for management given that these issues require specialty care.  She explained that she did not want to see a psychiatrist as she "worked for 30 or 40 years to get out of their database" and that she did not want to go back into their database and she just wanted "some kind of pill" to make her problem go away.  Patient states that she has had a lot of vomiting recently and  that she is "having a bunch of fluid come up".  States that she is also having food "gets stuck on the way down".  She is convinced that this is a thyroid issue but I explained to the patient that her TSH is well within the normal range and that no adjustment of her Synthroid is necessary at this time. She states that she feels that she has been on the omeprazole for "way too long" and is interested in trying something else.  Per her report she has been on this medication for several years.  I asked if she had discussed this with her gastroenterologist this is likely secondary to her GERD and she states that "her gastroenterologist asked me to switch her to something else".  PERTINENT  PMH / PSH: N/A   OBJECTIVE: BP (!) 112/58   Pulse 69   Ht 5\' 3"  (1.6 m)   Wt 161 lb 4 oz (73.1 kg)   SpO2 96%   BMI 28.56 kg/m   Gen: 69 year old African-American female, no acute distress, resting comfortably, hurried speech Resp: No accessory muscle use, no respiratory distress Neuro: Alert and oriented, Speech clear, No gross deficits   ASSESSMENT / PLAN:  GERD (gastroesophageal reflux disease) The vomiting and sensation of food getting stuck likely secondary to the patient's known GERD.  Her request to stop the omeprazole switch to something else I would recommend starting Pepcid.  Daily 40 mg daily for  now.  In regards to that sensation of food getting caught in her throat, I again feel this is likely secondary to her GERD but did recommend possibly reaching out for follow-up with her gastroenterologist.  Schizoaffective disorder, bipolar type ([place]) Unfortunately the patient's schizoaffective disorder complicates her medical picture significantly.  She is a difficult historian, and is very resistant to specialty psychiatry care.  I did recommend either following up with [facility] or for me to place a referral to a psychiatrist as I explained that her significant history and current symptoms are beyond the  scope of my care.  She states that she would just "work it out with God" and that she did not want a referral to psychiatry.  I attempted to discuss alternative medication regimens with her, but the patient states that that was not necessary she would work it out on her own  Type 2 diabetes mellitus ([place]) Unfortunately the patient stopped the [place] on her own due to difficult to describe side effects.  He states that her sugars have been really really good at home, but is unable to tell me the usual range for these.  Given that her A1c was 12.2 at last check, I did recommend starting a new medication, but the patient prefers to just use the Metformin 1 g twice daily for now.  I did recommend checking her sugars 3-4 times a day for the next week or so and let me know how they are doing since she has stopped the [place].  Dysphagia Likely secondary to her GERD.  Patient request I switch her from omeprazole to Pepcid.  Take this 40 mg daily.  I did recommend for her to follow-up with her gastroenterologist for continued management of this issue     Guadalupe Dawn MD PGY-3 Family Medicine Resident [place]

## 2019-12-27 NOTE — Assessment & Plan Note (Signed)
The vomiting and sensation of food getting stuck likely secondary to the patient's known GERD.  Her request to stop the omeprazole switch to something else I would recommend starting Pepcid.  Daily 40 mg daily for now.  In regards to that sensation of food getting caught in her throat, I again feel this is likely secondary to her GERD but did recommend possibly reaching out for follow-up with her gastroenterologist.

## 2019-12-27 NOTE — Telephone Encounter (Signed)
[place] emergency Call  Patient consented to have virtual visit and was identified by name and date of birth. Method of visit: Telephone  Encounter participants: Patient: [name] Long - located at home Provider: Daisy Floro - located at [facility] inpatient service Others (if applicable): None  Chief Complaint: "My doctor did not prescribe me anything for my diabetes  HPI:  Patient called the after-hours emergency line 1 day after seeing her PCP Dr. Kris Mouton in the clinic.  Patient reports that Dr. Kris Mouton was supposed to send a new medication for her diabetes into her pharmacy, but that she did not receive anything.  She has been taking Metformin 1000 mg twice a day for "30 years", recently tried [place] but reports the medication "makes me feel funny".  Per Dr. Dorise Bullion most recent note, [name] stopped taking the [place] on her own due to report of side effects.  Dr. Kris Mouton reported that the patient would like to continue just to take her metformin.  During this phone call, however, the patient reports that she does not intend on "taking a medication that I know is not working for me.  Why do not you give me something else that will work?".  Of note, the patient's HbA1c is 12.2% and she is not on any form of insulin.  I told the patient that usually an HbA1c greater than 9% warrants starting patients on insulin, which she says she had no intention of doing.  I told her other medications we could prescribe are meds such as Wilder Glade (patient reported side effects), liraglutide which is an injectable, amongst other medications; however, these are not medications the patient should be started on over the phone.  I encouraged the patient to continue to take her Metformin at least until she can be seen in clinic for follow-up.  Patient reported "I cannot be forced to take a medication that I know does not work."   I encouraged her  to continue to take the Metformin until she could be seen again next week and that if she were to feel bad in any way that she needs to visit the Emergency Department for evaluation. She hung up the phone.   Pertinent PMHx:  COPD Type 2 diabetes HTN HLD  Milus Banister, [place], PGY-2 12/27/2019 4:18 PM

## 2019-12-27 NOTE — Telephone Encounter (Signed)
Pt reports her PCP stopped the omeprazole yesterday because it was no longer working, pt states she has been taking it for 30 years. PCP gave pt pepcid. She reports she is a little better today and she wants to give the pepcid time to work. Pt instructed to call the office back if the pepcid did not help.

## 2019-12-27 NOTE — Telephone Encounter (Signed)
See previous phone note.  

## 2019-12-27 NOTE — Assessment & Plan Note (Signed)
Likely secondary to her GERD.  Patient request I switch her from omeprazole to Pepcid.  Take this 40 mg daily.  I did recommend for her to follow-up with her gastroenterologist for continued management of this issue

## 2019-12-27 NOTE — Assessment & Plan Note (Signed)
Unfortunately the patient stopped the [place] on her own due to difficult to describe side effects.  He states that her sugars have been really really good at home, but is unable to tell me the usual range for these.  Given that her A1c was 12.2 at last check, I did recommend starting a new medication, but the patient prefers to just use the Metformin 1 g twice daily for now.  I did recommend checking her sugars 3-4 times a day for the next week or so and let me know how they are doing since she has stopped the [place].

## 2019-12-27 NOTE — Assessment & Plan Note (Signed)
Unfortunately the patient's schizoaffective disorder complicates her medical picture significantly.  She is a difficult historian, and is very resistant to specialty psychiatry care.  I did recommend either following up with [facility] or for me to place a referral to a psychiatrist as I explained that her significant history and current symptoms are beyond the scope of my care.  She states that she would just "work it out with God" and that she did not want a referral to psychiatry.  I attempted to discuss alternative medication regimens with her, but the patient states that that was not necessary she would work it out on her own

## 2019-12-28 ENCOUNTER — Other Ambulatory Visit: Payer: Self-pay | Admitting: Internal Medicine

## 2019-12-28 ENCOUNTER — Other Ambulatory Visit: Payer: Self-pay | Admitting: Family Medicine

## 2019-12-28 DIAGNOSIS — J45909 Unspecified asthma, uncomplicated: Secondary | ICD-10-CM

## 2020-01-13 ENCOUNTER — Telehealth: Payer: Self-pay | Admitting: Orthopedic Surgery

## 2020-01-13 NOTE — Telephone Encounter (Signed)
Patient called. Says she is in a lot of pain. Would like to know what to do. Her call back number is [phone].

## 2020-01-13 NOTE — Telephone Encounter (Signed)
We could set her up with physical therapy upstairs to help with the chronic pain.  Would also recommend the patient follow-up with her primary care physician to ensure  her glucose, blood pressure and electrolytes are within normal ranges.

## 2020-01-14 NOTE — Telephone Encounter (Signed)
Spoke to patient. States she has a crack in her back and is having pain. Would like to see Dr.Duda tomorrow. States her daughter is off tomorrow and can only come in tomorrow.  She declined PT Referral and states she already went to her PCP appt  1 month ago and everthing looked good.

## 2020-01-15 NOTE — Telephone Encounter (Signed)
Patient wanted to be scheduled to come in to see Dr Sharol Given 01/16/20 at 3 pm.

## 2020-01-16 ENCOUNTER — Encounter: Payer: Self-pay | Admitting: Orthopedic Surgery

## 2020-01-16 ENCOUNTER — Other Ambulatory Visit: Payer: Self-pay

## 2020-01-16 ENCOUNTER — Ambulatory Visit (INDEPENDENT_AMBULATORY_CARE_PROVIDER_SITE_OTHER): Payer: Medicare Other

## 2020-01-16 ENCOUNTER — Ambulatory Visit (INDEPENDENT_AMBULATORY_CARE_PROVIDER_SITE_OTHER): Payer: Medicare Other | Admitting: Orthopedic Surgery

## 2020-01-16 ENCOUNTER — Other Ambulatory Visit: Payer: Self-pay | Admitting: Orthopedic Surgery

## 2020-01-16 VITALS — Ht 63.0 in | Wt 161.2 lb

## 2020-01-16 DIAGNOSIS — M170 Bilateral primary osteoarthritis of knee: Secondary | ICD-10-CM | POA: Diagnosis not present

## 2020-01-16 DIAGNOSIS — M545 Low back pain, unspecified: Secondary | ICD-10-CM

## 2020-01-16 DIAGNOSIS — G8929 Other chronic pain: Secondary | ICD-10-CM

## 2020-01-16 MED ORDER — METHOCARBAMOL 500 MG PO TABS: 500.0000 mg | ORAL_TABLET | Freq: Three times a day (TID) | ORAL | 0 refills | Status: DC

## 2020-01-16 NOTE — Progress Notes (Signed)
Office Visit Note   Patient: [name] Long           Date of Birth: Aug 02, 1950           MRN: [id number] Visit Date: 01/16/2020              Requested by: Theone Stanley, [place],  [place] [id number] PCP: Theone Stanley, DO  Chief Complaint  Patient presents with  . Right Knee - Pain, Follow-up  . Left Knee - Pain, Follow-up  . Lower Back - Pain, Follow-up      HPI: She lifted pots and pans and has been having increasing lower back pain.  She states she has been in bed for 7 days due to her back pain.  She currently uses a walker for ambulation.  Patient has tried prednisone but she states she stopped this due to itching.  Assessment & Plan: Visit Diagnoses:  1. Chronic bilateral low back pain without sciatica     Plan: A prescription is called in for Robaxin.  Later the pharmacy called to say that Robaxin is not covered by her insurance and a prescription for Flexeril is sent in for her lower back spasm.  Follow-Up Instructions: No follow-ups on file.   Ortho Exam  Patient is alert, oriented, no adenopathy, well-dressed, normal affect, normal respiratory effort. Examination patient has no focal motor weakness in the lower extremity negative straight leg raise no sciatic symptoms her pain is primarily in her lumbar spine she has bilateral knee pain but no cellulitis no signs of infection.  Imaging: No results found. No images are attached to the encounter.  Labs: Lab Results  Component Value Date   HGBA1C 12.2 (A) 10/30/2019   HGBA1C 9.3 (A) 06/26/2019   HGBA1C 8.8 (A) 01/11/2019   LABORGA NO GROWTH 12/04/2014     Lab Results  Component Value Date   ALBUMIN 3.4 (L) 09/08/2018   ALBUMIN 3.3 (L) 12/06/2017   ALBUMIN 2.8 (L) 07/10/2017    Lab Results  Component Value Date   MG 1.7 12/25/2016   MG 1.8 06/10/2016   MG 1.8 02/02/2016   Lab Results  Component Value Date   VD25OH 43.0 02/23/2018   VD25OH 30 05/06/2015   VD25OH 12  (L) 12/18/2014    No results found for: PREALBUMIN CBC EXTENDED Latest Ref Rng & Units 09/08/2018 12/06/2017 12/06/2017  WBC 4.0 - 10.5 K/uL 7.1 - 6.2  RBC 3.87 - 5.11 MIL/uL 3.90 - 3.70(L)  HGB 12.0 - 15.0 g/dL 13.4 14.3 13.0  HCT 36 - 46 % 38.8 42.0 38.1  PLT 150 - 400 K/uL 191 - 178  NEUTROABS 1.7 - 7.7 K/uL - - 2.2  LYMPHSABS 0.7 - 4.0 K/uL - - 3.2     Body mass index is 28.56 kg/m.  Orders:  Orders Placed This Encounter  Procedures  . XR Lumbar Spine 2-3 Views   Meds ordered this encounter  Medications  . methocarbamol (ROBAXIN) 500 MG tablet    Sig: Take 1 tablet (500 mg total) by mouth 3 (three) times daily.    Dispense:  30 tablet    Refill:  0     Procedures: No procedures performed  Clinical Data: No additional findings.  ROS:  All other systems negative, except as noted in the HPI. Review of Systems  Objective: Vital Signs: Ht '5\' 3"'  (1.6 m)   Wt 161 lb 4 oz (73.1 kg)   BMI 28.56 kg/m   Specialty Comments:  No specialty comments available.  PMFS History: Patient Active Problem List   Diagnosis Date Noted  . GERD (gastroesophageal reflux disease) 12/27/2019  . Post-menopausal atrophic vaginitis 12/13/2019  . Vagina, candidiasis 12/10/2019  . Vaginal itching 11/10/2019  . Dysphagia 07/02/2019  . Screening examination for STD (sexually transmitted disease) 03/25/2019  . 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 Other chronic pain 12/03/2018  . Dizziness of unknown cause 08/04/2018  . Tremor 07/15/2018  . Atrial flutter with rapid ventricular response ([place]) 07/10/2017  . Atrial fibrillation ([place]) 07/05/2017  . COPD (chronic obstructive pulmonary disease) ([place]) 01/22/2017  . Pedal edema   . Acute on chronic congestive heart failure ([place])   . Chronic bilateral low back pain without sciatica 08/15/2016  . 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 Schizoaffective disorder, bipolar type ([place])   .  Osteoarthritis, multiple sites 06/08/2011  . Dysuria 02/14/2011  . CHRONIC KIDNEY DISEASE STAGE II (MILD) 09/14/2009  . Hypothyroidism 08/31/2006  . Type 2 diabetes mellitus ([place]) 08/31/2006  . HYPERCHOLESTEROLEMIA 08/31/2006  . Tobacco abuse 08/31/2006  . HYPERTENSION, BENIGN SYSTEMIC 08/31/2006   Past Medical History:  Diagnosis Date  . Arthritis    "real bad; all over" (07/12/2017)  . Asthma   . Atrial fibrillation ([place])   . BIPOLAR DISORDER 08/31/2006   Qualifier: Diagnosis of  By: Dorathy Daft MD, Marjory Lies    . CHRONIC KIDNEY DISEASE STAGE II (MILD) 09/14/2009   Annotation: eGFR 64 Qualifier: Diagnosis of  By: Jess Barters MD, [name]    . Chronic lower back pain   . Congestive heart failure (CHF) ([place])   . COPD (chronic obstructive pulmonary disease) ([place]) 09/23/2010   Diagnosed at [facility] in 2008 (Dr. Annamaria Boots)   . DM (diabetes mellitus) type II controlled with renal manifestation ([place]) 08/31/2006   Qualifier: Diagnosis of  By: Dorathy Daft MD, Marjory Lies    . Dyspnea    "all my life; since 6th grade" (07/12/2017)  . HYPERCHOLESTEROLEMIA 08/31/2006   Intolerance to Lipitor OK on Crestor but medicaid no longer covering    . HYPERTENSION, BENIGN SYSTEMIC 08/31/2006   Qualifier: Diagnosis of  By: Dorathy Daft MD, Marjory Lies    . Hypokalemia   . HYPOTHYROIDISM, UNSPECIFIED 08/31/2006   Qualifier: Diagnosis of  By: Dorathy Daft MD, Marjory Lies    . Leg swelling 03/14/2018  . Pneumonia    "3 times" (07/12/2017)  . Schizophrenia ([place])   . Scoliosis     Family History  Problem Relation Age of Onset  . Breast cancer Sister   . Heart disease Mother   . Rectal cancer Mother   . Diabetes Father   . Hypertension Father   . Heart disease Brother   . Asthma Daughter   . Asthma Son   . Breast cancer Sister   . Asthma Daughter   . Colon cancer Maternal Grandmother   . Stomach cancer Neg Hx   . Esophageal cancer Neg Hx   . Pancreatic cancer Neg Hx     Past Surgical History:  Procedure Laterality Date  . CESAREAN SECTION     . FOOT FRACTURE SURGERY Right    "steel plate in it"  . FOOT SURGERY     " born w/dislocated foot"  . FRACTURE SURGERY    . KNEE ARTHROSCOPY Right   . TOE SURGERY Bilateral    "both pinky toes"  . TONSILLECTOMY AND ADENOIDECTOMY     Social History   Occupational History  . Not on file  Tobacco Use  .  Smoking status: Current Some Day Smoker    Packs/day: 0.25    Years: 45.00    Pack years: 11.25    Types: Cigarettes  . Smokeless tobacco: Never Used  . Tobacco comment: patient states she smokes 1 cigarette per month  Vaping Use  . Vaping Use: Never used  Substance and Sexual Activity  . Alcohol use: Yes    Comment: 07/12/2017 "stopped 2 yr ago; did have a drink over the holidays recently"  . Drug use: No  . Sexual activity: Not Currently

## 2020-01-17 ENCOUNTER — Telehealth: Payer: Self-pay | Admitting: Orthopedic Surgery

## 2020-01-17 ENCOUNTER — Encounter: Payer: Self-pay | Admitting: Orthopedic Surgery

## 2020-01-17 ENCOUNTER — Other Ambulatory Visit: Payer: Self-pay | Admitting: Orthopedic Surgery

## 2020-01-17 MED ORDER — CYCLOBENZAPRINE HCL 10 MG PO TABS
10.0000 mg | ORAL_TABLET | Freq: Three times a day (TID) | ORAL | 0 refills | Status: DC | PRN
Start: 2020-01-17 — End: 2020-04-28

## 2020-01-17 NOTE — Telephone Encounter (Signed)
Patient called advised the pharmacy told her that the methocarbamol was not sent in with the mg of the medication on the Rx. The pharmacy will not fill Rx. The number to contact patient is [phone]

## 2020-01-17 NOTE — Telephone Encounter (Signed)
Please call patient and pharmacy, the order that is in the chart shows robaxin 500mg  tid prn number [id number]

## 2020-01-17 NOTE — Telephone Encounter (Signed)
Prescription sent for Flexeril.

## 2020-01-17 NOTE — Telephone Encounter (Signed)
Dr Sharol Given, I called patient to inform her that pharmacist stated she is not covered by insurance for this medication but you can change it to Flexeril or another medication. Please advise, thank you.

## 2020-01-17 NOTE — Telephone Encounter (Signed)
LM advising submitted.

## 2020-01-17 NOTE — Telephone Encounter (Signed)
Please advise, thank you.

## 2020-01-18 ENCOUNTER — Other Ambulatory Visit: Payer: Self-pay | Admitting: Family Medicine

## 2020-01-23 ENCOUNTER — Ambulatory Visit: Payer: Medicare Other | Admitting: Orthopedic Surgery

## 2020-01-28 ENCOUNTER — Telehealth: Payer: Self-pay | Admitting: Internal Medicine

## 2020-01-28 DIAGNOSIS — R1310 Dysphagia, unspecified: Secondary | ICD-10-CM

## 2020-01-28 MED ORDER — SUCRALFATE 1 GM/10ML PO SUSP
1.0000 g | Freq: Three times a day (TID) | ORAL | 0 refills | Status: DC
Start: 2020-01-28 — End: 2020-02-06

## 2020-01-28 NOTE — Telephone Encounter (Signed)
Pt is scheduled to see Colleen on 8/5. Pt would like some advise in the meantime, she stated that she keeps chocking with food and medication that she was put on is not working.

## 2020-01-28 NOTE — Telephone Encounter (Signed)
Spoke with patient and she reports that "liquids are backing up especially when I belch." Feels like the food stays in the top of her stomach. Reports she is hungry but it hurts when she eats so she is not eating much and is losing weight. She tried the Pepcid her PCP gave her for 3 weeks but it was not working at all so 2 weeks ago she stopped it and restarted her Omeprazole 40 mg BID. She is not doing any better. Has OV with PA on 02/06/20 but is requesting some advise until this visit.

## 2020-01-28 NOTE — Telephone Encounter (Signed)
Scheduled barium esophagram with Peggy at [facility] on 02/04/20 at 10:30 AM. NPO 3 hours prior. Spoke with patient and gave her MD recommendations and radiology appt. Rx sent to pharmacy.

## 2020-01-28 NOTE — Telephone Encounter (Signed)
Agree with APP appointment Agree with resuming PPI, continue omeprazole 40 mg BID-AC Add sucralfate slurry or suspension 1 g TID-AC and HS Perform barium esophagram with tablet to eval esophageal motility given ongoing symptoms and apparently no response to esophageal dilation to 17 mm in May 2021

## 2020-01-28 NOTE — Telephone Encounter (Signed)
Pt is requesting some advise on what she can do.

## 2020-02-02 ENCOUNTER — Telehealth: Payer: Self-pay | Admitting: Family Medicine

## 2020-02-02 NOTE — Telephone Encounter (Signed)
Emergency After Hours Line:  Patient calls emergency after hours line regarding severe tooth pain. She saw her dentist the other day who said there was no infection but that it needs to be pulled. She has been taking Aleve every 6 hours but the pain is still very severe. Recommended she try taking Tylenol 650mg  q6 hours, alternating with Ibuprofen every 3 hours. If pain worsens she may go to Urgent care/ED to be evaluated. She may also try to find an emergency Dentist. She voiced understanding and agreement with plan.  Mina Marble, DO [facility], PGY3 02/02/2020 12:47 AM

## 2020-02-03 ENCOUNTER — Encounter: Payer: Self-pay | Admitting: Family Medicine

## 2020-02-03 ENCOUNTER — Other Ambulatory Visit: Payer: Self-pay

## 2020-02-03 ENCOUNTER — Ambulatory Visit (INDEPENDENT_AMBULATORY_CARE_PROVIDER_SITE_OTHER): Payer: Medicare Other | Admitting: Family Medicine

## 2020-02-03 VITALS — BP 130/80 | HR 75 | Ht 63.0 in | Wt 158.6 lb

## 2020-02-03 DIAGNOSIS — N182 Chronic kidney disease, stage 2 (mild): Secondary | ICD-10-CM

## 2020-02-03 DIAGNOSIS — E1122 Type 2 diabetes mellitus with diabetic chronic kidney disease: Secondary | ICD-10-CM

## 2020-02-03 DIAGNOSIS — K047 Periapical abscess without sinus: Secondary | ICD-10-CM

## 2020-02-03 LAB — POCT GLYCOSYLATED HEMOGLOBIN (HGB A1C): HbA1c, POC (controlled diabetic range): 12 % — AB (ref 0.0–7.0)

## 2020-02-03 MED ORDER — AMOXICILLIN-POT CLAVULANATE 875-125 MG PO TABS: 1.0000 | ORAL_TABLET | Freq: Two times a day (BID) | ORAL | 0 refills | Status: DC

## 2020-02-03 MED ORDER — PENICILLIN V POTASSIUM 500 MG PO TABS: 500.0000 mg | ORAL_TABLET | Freq: Four times a day (QID) | ORAL | 1 refills | Status: DC

## 2020-02-03 MED ORDER — IBUPROFEN 600 MG PO TABS: 600.0000 mg | ORAL_TABLET | Freq: Three times a day (TID) | ORAL | 0 refills | Status: DC | PRN

## 2020-02-03 NOTE — Patient Instructions (Signed)
It was great seeing you today! Please check-out at the front desk before leaving the clinic.  Visit Remembers: - Stop by the pharmacy to pick up your prescriptions  - Continue to work on your healthy eating habits and incorporating exercise into your daily life. - you should receive a phone call able the oral surgeon appointment - please take antibiotics 4 times a day until you get your tooth removed    Regarding lab work today:  Due to recent changes in healthcare laws, you may see the results of your imaging and laboratory studies on MyChart before your provider has had a chance to review them.  I understand that in some cases there may be results that are confusing or concerning to you. Not all laboratory results come back in the same time frame and you may be waiting for multiple results in order to interpret others.  Please give [place] 72 hours in order for your provider to thoroughly review all the results before contacting the office for clarification of your results. If everything is normal, you will get a letter in the mail or a message in My Chart. Please give [place] a call if you do not hear from [place] after 2 weeks.  Please bring all of your medications with you to each visit.    If you haven't already, sign up for My Chart to have easy access to your labs results, and communication with your primary care physician.  Feel free to call with any questions or concerns at any time, at [phone].   Take care,  Dr. Rushie [facility]

## 2020-02-03 NOTE — Progress Notes (Signed)
   SUBJECTIVE:   CHIEF COMPLAINT / HPI:   Chief Complaint  Patient presents with  . Dental Pain     [name] Long is a 69 y.o. female here for dental pain.    Pt states she was seen in her dentist's office for tooth pain and she was told she had an abscess.  Reports being told she needs to see a "surgeon" to take her tooth out. Reports taking ibuprofen and Tylenol OTC for pain releif.  Denies fevers.  Decreased appetite due to tooth pain.  Pain located on the bottom left jaw.      PERTINENT  PMH / PSH: reviewed and updated as appropriate   OBJECTIVE:   BP 130/80   Pulse 75   Ht 5\' 3"  (1.6 m)   Wt 158 lb 9.6 oz (71.9 kg)   SpO2 99%   BMI 28.09 kg/m   GEN: well appearing female in no acute distress  CVS: well perfused  RESP: speaking in full sentences without pause  HENT: multiple dental caries, erythema and edema to gums near teeth 17,18, no exudates, no tonsillar edema, uvula midline, lower left jaw tenderness, mild edema present   ASSESSMENT/PLAN:   Abscessed tooth Pen-V-K for 14 days, refills sent until patient can be seen by a oral surgeon - maxillofacial surgeon referral sent  - Ibuprofen and Tylenol for pain   Type 2 diabetes mellitus ([place]) a1c today.  Follow up with PCP ASAP      [name], DO PGY-2, [facility] 02/03/2020

## 2020-02-04 ENCOUNTER — Ambulatory Visit (HOSPITAL_COMMUNITY)
Admission: RE | Admit: 2020-02-04 | Discharge: 2020-02-04 | Disposition: A | Discharge status: Home / Self Care | Payer: Medicare Other | Source: Ambulatory Visit | Attending: Internal Medicine | Admitting: Internal Medicine

## 2020-02-04 DIAGNOSIS — K228 Other specified diseases of esophagus: Secondary | ICD-10-CM | POA: Diagnosis not present

## 2020-02-04 DIAGNOSIS — R1310 Dysphagia, unspecified: Secondary | ICD-10-CM | POA: Diagnosis not present

## 2020-02-06 ENCOUNTER — Other Ambulatory Visit: Payer: Self-pay

## 2020-02-06 ENCOUNTER — Telehealth: Payer: Self-pay | Admitting: Family Medicine

## 2020-02-06 ENCOUNTER — Institutional Professional Consult (permissible substitution): Payer: Medicare Other | Admitting: Pulmonary Disease

## 2020-02-06 ENCOUNTER — Ambulatory Visit: Payer: Medicare Other | Admitting: Nurse Practitioner

## 2020-02-06 MED ORDER — SUCRALFATE 1 GM/10ML PO SUSP: 1.0000 g | Freq: Three times a day (TID) | ORAL | 0 refills | Status: DC

## 2020-02-06 NOTE — Telephone Encounter (Signed)
Hello,  I haven't worked with this patient yet, and I didn't see this patient when she was prescribed the Ibuprofen 600mg  I believe Dr. Susa Simmonds took care of her. Im not sure if you would like me to increase ibuprofen to 800mg ? Im not aware of her situation. Please advise. Thanks!

## 2020-02-06 NOTE — Telephone Encounter (Signed)
Patient returns call to nurse line stating that she has an urgent issue that needs to be addressed. Patient reports that pain has not gotten any better with the 600 mg of motrin. States that she discussed with Dr. Susa Simmonds increasing dosage of ibuprofen to 800mg  or starting a stronger pain medication.    Talbot Grumbling, RN

## 2020-02-06 NOTE — Telephone Encounter (Signed)
Pt states she need a stronger medication for pain, the Ibuprophen 600 mg is not strong enough.  Left message for the nurse line and has not heard back. Pls call pt. [phone]

## 2020-02-06 NOTE — Telephone Encounter (Signed)
Patient called states she had oral surgery and was not able to come in but she is requesting a medication refill also looking for results. Please advise

## 2020-02-06 NOTE — Telephone Encounter (Signed)
Carafate refilled as per pt request. Let pt know that as soon as Dr. Hilarie Fredrickson reviews her xray we will call her with the results. Pt verbalized understanding.

## 2020-02-07 ENCOUNTER — Other Ambulatory Visit: Payer: Self-pay | Admitting: Family Medicine

## 2020-02-07 MED ORDER — IBUPROFEN 800 MG PO TABS: 800.0000 mg | ORAL_TABLET | Freq: Three times a day (TID) | ORAL | 0 refills | Status: AC | PRN

## 2020-02-07 NOTE — Telephone Encounter (Signed)
I have called the patient and prescribed motrin 800mg  TID for 30 days only. She should not use the 600mg  motrin tablets with this. Recommended follow up in the next 1-2 weeks with PCP or Dr Susa Simmonds who saw this patient recently for tooth pain. Patient was happy with this plan.

## 2020-02-09 ENCOUNTER — Encounter: Payer: Self-pay | Admitting: Family Medicine

## 2020-02-09 DIAGNOSIS — K047 Periapical abscess without sinus: Secondary | ICD-10-CM

## 2020-02-09 HISTORY — DX: Periapical abscess without sinus: K04.7

## 2020-02-09 NOTE — Assessment & Plan Note (Signed)
a1c today.  Follow up with PCP ASAP

## 2020-02-09 NOTE — Assessment & Plan Note (Signed)
Pen-V-K for 14 days, refills sent until patient can be seen by a oral surgeon - maxillofacial surgeon referral sent  - Ibuprofen and Tylenol for pain

## 2020-02-12 ENCOUNTER — Ambulatory Visit (INDEPENDENT_AMBULATORY_CARE_PROVIDER_SITE_OTHER): Payer: Medicare Other | Admitting: Family Medicine

## 2020-02-12 ENCOUNTER — Encounter: Payer: Self-pay | Admitting: Family Medicine

## 2020-02-12 ENCOUNTER — Other Ambulatory Visit: Payer: Self-pay

## 2020-02-12 DIAGNOSIS — K047 Periapical abscess without sinus: Secondary | ICD-10-CM

## 2020-02-12 MED ORDER — PENICILLIN V POTASSIUM 500 MG PO TABS: 500.0000 mg | ORAL_TABLET | Freq: Four times a day (QID) | ORAL | 0 refills | Status: DC

## 2020-02-12 NOTE — Assessment & Plan Note (Addendum)
Refilled Pen-V-K for 30 days as patient's appointment is Sept 14th with Dr. Hoyt Koch.  Called [facility] and patient has appointment scheduled on Monday Aug 16th at 11:20 but patient has to pay out of pocket.  Patient was told to call and cancel this appointment if she does not want the consultation.   - Take Ibuprofen q8 hrs as needed  - Take Tylenol q8hr as needed for pain  - Take antibiotics until her appointment next month

## 2020-02-12 NOTE — Progress Notes (Signed)
   SUBJECTIVE:   CHIEF COMPLAINT / HPI:   Chief Complaint  Patient presents with  . Medication Management     [name] Long is a 70 y.o. female here for dental pain   Patient seen about 1-2 weeks ago for similar complaints and given antibiotics and referal to oral surgeon.  She has not yet seen oral surgeon as appt is scheduled for Sept 14th with Dr. Hoyt Koch.  She states she "can't wait that long" and would like to be referred to someone else.  Sharp dental pain and bad breathe has improved with antibiotics and ibuprofen. Denies fever or chills, headache or neck pain.     PERTINENT  PMH / PSH: reviewed and updated as appropriate   OBJECTIVE:   BP 112/60   Pulse 64   Ht 5\' 3"  (1.6 m)   Wt 156 lb (70.8 kg)   SpO2 98%   BMI 27.63 kg/m    GEN: well appearing female in no acute distress  CVS: well perfused  RESP: speaking in full sentences without pause  HENT: atraumatic, multiple dental caries, gum swelling and redness around tooth 17-18, greenish discoloration has resolved  NECK: supple, no lymphadenopathy, normal ROM    ASSESSMENT/PLAN:   Abscessed tooth Refilled Pen-V-K for 30 days as patient's appointment is Sept 14th with Dr. Hoyt Koch.  Called [facility] and patient has appointment scheduled on Monday Aug 16th at 11:20 but patient has to pay out of pocket.  Patient was told to call and cancel this appointment if she does not want the consultation.   - Take Ibuprofen q8 hrs as needed  - Take Tylenol q8hr as needed for pain  - Take antibiotics until her appointment      [name], DO PGY-2, [facility] 02/12/2020

## 2020-02-12 NOTE — Patient Instructions (Addendum)
It was great seeing you today! Please check-out at the front desk before leaving the clinic.   Visit Remembers: - Stop by the pharmacy to pick up your prescriptions  - Call the other oral surgeon offices today to schedule an appointment for your tooth to be removed to see if you can be seen earlier than September   - [facility] at [phone] at [address]: Can see you Mon 02/17/20 at 11:20 AM but will not take your insurance.   - Call [facility] at [phone] to see if they can see you earlier   Please bring all of your medications with you to each visit.    If you haven't already, sign up for My Chart to have easy access to your labs results, and communication with your primary care physician.  Feel free to call with any questions or concerns at any time, at [phone].   Take care,  Dr. [facility]ie [facility] [place]

## 2020-02-13 ENCOUNTER — Telehealth: Payer: Self-pay | Admitting: Internal Medicine

## 2020-02-13 MED ORDER — SUCRALFATE 1 GM/10ML PO SUSP: 1.0000 g | Freq: Three times a day (TID) | ORAL | 0 refills | Status: DC

## 2020-02-13 NOTE — Telephone Encounter (Signed)
Patient is advised that we sent a prescription on 02/06/20 for sucralfate. She states that she has already taken most of the bottle that she picked up on 02/06/20. I advised she should only be taking sucralfate PRN. Patient was unaware of her barium swallow results so I did go over those with her (see below).   Jerene Bears, MD  02/10/2020 6:45 PM EDT     Please let patient know that her barium esophagram does not show any evidence of a mass or esophageal narrowing. No findings of esophageal inflammation There is mild weakness in the contractions of her esophagus which can at times cause dysphagia symptom. Barium tablet moves freely into the stomach No reflux was elicited Occasionally reflux can cause dysmotility and she does have a prescription for famotidine 40 mg daily. She also was recently prescribed Carafate which may help as well but would only use as needed Let me know if she has questions and if symptoms persist an office follow-up is recommended   Patient indicates that she is NOT taking famotidine because it did not work. She states that she is taking omeprazole 40 mg twice daily and is taking sucralfate 4 times daily but is still not getting much relief from her symptoms. Since she is still having so many symptoms despite meds, I have scheduled her to see one of our APPS on 03/04/20 (soonest available appt). In the meantime, I have refilled sucralfate.

## 2020-02-14 ENCOUNTER — Other Ambulatory Visit: Payer: Self-pay | Admitting: Family Medicine

## 2020-02-16 ENCOUNTER — Other Ambulatory Visit: Payer: Self-pay | Admitting: Student in an Organized Health Care Education/Training Program

## 2020-02-19 ENCOUNTER — Ambulatory Visit: Payer: Medicare Other

## 2020-02-19 ENCOUNTER — Other Ambulatory Visit: Payer: Medicare Other

## 2020-02-19 ENCOUNTER — Telehealth: Payer: Self-pay | Admitting: Internal Medicine

## 2020-02-19 ENCOUNTER — Other Ambulatory Visit: Payer: Self-pay | Admitting: Family Medicine

## 2020-02-19 MED ORDER — FAMOTIDINE 40 MG PO TABS
40.0000 mg | ORAL_TABLET | Freq: Every day | ORAL | 3 refills | Status: DC
Start: 2020-02-19 — End: 2020-03-04

## 2020-02-19 NOTE — Telephone Encounter (Signed)
Pt states she is still having a lot of issues with reflux, she is requesting a prescription be sent into the pharmacy for pepcid 40mg  daily as listed on result note from barium swallow. Script sent to pharmacy.

## 2020-02-20 ENCOUNTER — Telehealth: Payer: Self-pay | Admitting: *Deleted

## 2020-02-20 NOTE — Telephone Encounter (Signed)
Received fax from [facility] requesting that pt may benefit from changing 30 day supply of Farxiga to a 90 day supply. April Zimmerman Rumple, CMA

## 2020-02-21 NOTE — Telephone Encounter (Signed)
Ok I can look at adjusting that. Thank you

## 2020-02-27 ENCOUNTER — Telehealth: Payer: Self-pay | Admitting: Family Medicine

## 2020-02-27 NOTE — Telephone Encounter (Signed)
Patient calling because she is having a reaction to her medication she was given for her tooth at her last visit. Offered patient an appointment for tomorrow (02/28/2020) but patient declined. Patient requesting Dr. Susa Simmonds call her back to discuss this.  Routing to blue team since she say [place] for this visit.

## 2020-02-28 NOTE — Telephone Encounter (Signed)
I called patient back and asked her what kind of reaction she was having.  Patient states that she was originally taking 600mg  of ibuprofen q 8 hours for pain along with the antibiotic.  She then call and spoke with an after hours provider and was advised to up her dose to 800mg .  Patient states that she has been experiencing dizziness the entire time with her toothache except that she "was high off the medicine and didn't feel it".  Patient stopped the medication on her own and advised that dizziness should wear off and if it doesn't or gets worse to seek care in emergency dept or go to an urgent care.  She can try and get in next week to see anyone for this concern also.  Patient voiced understanding. Shakeira Rhee,CMA

## 2020-02-28 NOTE — Telephone Encounter (Signed)
Will forward to MD to advise. Kenedee Molesky,CMA  

## 2020-02-28 NOTE — Telephone Encounter (Signed)
Patient is calling again asking to speak with Dr. Susa Simmonds. Informed patient that message was routed to her this morning.   Please call to discuss below.  The best call back number is [phone].

## 2020-03-01 ENCOUNTER — Other Ambulatory Visit: Payer: Self-pay | Admitting: Internal Medicine

## 2020-03-03 NOTE — Progress Notes (Signed)
03/03/2020 [name] Long [id number] Nov 12, 1950   Chief Complaint: GERD symptoms   History of Present Illness: [name] Long is a 69 year old female with a past medical history of osteoarthritis, bipolar disorder, hypertension, atrial fibrillation not on anticoagulation, CHF, hypercholesterolemia, hypothyroidism, asthma, COPD, CKD stage II, DM II, scoliosis and GERD.   She was last seen in our office by Dr. Hilarie Fredrickson on 10/08/2019 with persistent GERD symptoms, dysphagia and epigastric pressure. She was advised to continue Omeprazole 40mg  bid and an abdominal sonogram, EGD and screening colonoscopy were ordered.  An abdominal sonogram 10/14/2019 showed a normal gallbladder and hepatic steatosis.  She underwent an EGD and colonoscopy 11/05/2019.  The EGD identified a normal esophagus which was dilated to 57mm and gastritis. No evidence of H. Pylori. The colonoscopy identified 3 tubular adenomatous/sessile serrated polyps which were removed from the transverse colon and on hyperplastic polyp was removed from the rectum. A repeat colonoscopy in 3 years was recommended. She reported having dyspnea and Dr. Hilarie Fredrickson advised cardiac evaluation. She has not seen a cardiologist. No further dyspnea. No chest pain or palpitations.   She continued to have reflux symptoms and she was seen by her  PCP who stopped Omperazole and prescribed Pepcid 12/26/2019. However, when she went to the pharmacy she was given Famotidine and she thought they gave her the wrong medication so she threw it out. She restarted Omeprazole 40mg  po bid and Carafate 1 gm po qid was added on 7/27. She continued to have dysphagia symptoms therefore a barium swallow was done 02/04/2020 which showed mild presbyesophagus otherwise was normal. She stopped taking Omeprazole a few weeks ago and is taking Carafate 1gm qid and Pepto bismol one swig daily.  Currently, she reports her reflux symptoms have improved.  She no longer feels as if food gets stuck  to her esophagus or to the top of her stomach.  No regurgitation.  He is having less heartburn.  No lower abdominal pain.  She has diarrhea if she eats fatty or fried foods.  If she eats a healthy diet she has formed brown bowel movements.  She is having dental pain for which she received ampicillin then was recently prescribed Pen-Vee K for 30 days.  She has some nausea since taking the antibiotics.  She is scheduled to see an oral surgeon on 03/17/2020.  She reported taking Ibuprofen 600 mg 3-4 times daily for a week or so then switched to Tylenol for her dental pain.  She wishes to go back on Omeprazole as she felt better when she took it.   Barium Swallow with tablet 02/04/2020: No evidence of esophageal mass or stricture. No findings of esophagitis noted. Break up of primary peristalsis noted in the lower thoracic esophagus, with subsequent secondary contractions resulting in esophageal emptying. No evidence of hiatal hernia or gastroesophgeal reflux. An ingested 18mm barium tablet passed freely through the esophagus, and into the stomach. IMPRESSION: Mild presbyesophagus.  Otherwise negative esophagram.  Abdominal sonogram 10/14/2019: Normal gallbladder.  Increased liver echogenicity reflecting steatosis.  EGD 11/05/2019: - Normal esophagus. - No endoscopic esophageal abnormality to explain patient's dysphagia. Esophagus dilated to 17 mm with 51 Fr Savary. - Gastritis.  Biopsy showed reactive gastropathy and gastric oxyntic mucosa with parietal cell hyperplasia most likely due to PPI use.  No evidence of H. pylori. - Normal examined duodenum.  Colonoscopy 11/05/2019: - Three 3 to 6 mm tubular adenomatous/sessile serrated polyps in the transverse colon, removed with a cold snare. -  One 6 mm hyperplastic polyp in the proximal rectum, removed with a cold snare. Resected and retrieved. - The examination was otherwise normal on direct and retroflexion views. - Recall colonoscopy 3 years     Current Medications, Allergies, Past Medical History, Past Surgical History, Family History and Social History were reviewed in [employer] record.   Physical Exam: BP 110/64   Pulse 89   Ht 5\' 3"  (1.6 m)   Wt 149 lb (67.6 kg)   BMI 26.39 kg/m   Wt Readings from Last 3 Encounters:  03/04/20 149 lb (67.6 kg)  02/12/20 156 lb (70.8 kg)  02/03/20 158 lb 9.6 oz (71.9 kg)   General: Well developed 69 year old female in no acute distress. Head: Normocephalic and atraumatic. Eyes: No scleral icterus. Conjunctiva pink .  Lungs: Clear throughout to auscultation. Heart: Regular rate and rhythm, no murmur. Abdomen: Soft, nondistended. Mild epigastric tenderness without rebound or guarding. No masses or hepatomegaly. Normal bowel sounds x 4 quadrants.  Rectal: Deferred.  Musculoskeletal: Symmetrical with no gross deformities. Extremities: No edema. Neurological: Alert oriented x 4. No focal deficits.  Psychological: Alert and cooperative. Normal mood and affect  Assessment and Recommendations:  76.  69 year old female with GERD symptoms and dysphagia. EGD 11/05/2019 was normal. Barium swallow showed mild presbyesophagus. + NSAID use due to dental pain.  -Reduce Carafate 1 g p.o. twice daily -Restart Omeprazole 40 mg p.o. once daily -Avoid NSAIDs/ibuprofen -Avoid fatty fried foods -Follow up with Dr. Hilarie Fredrickson in 4 months -Patient to call our office if symptoms worsen  2. History of tubular adenomatous/sessile serrated and hyperplastic polyps to the colon and rectum -Next colonoscopy due 11/2022  3.  Hepatic steatosis -Avoid weight gain  4.  COPD  5. DM II

## 2020-03-04 ENCOUNTER — Other Ambulatory Visit: Payer: Medicare Other

## 2020-03-04 ENCOUNTER — Telehealth: Payer: Self-pay | Admitting: Family Medicine

## 2020-03-04 ENCOUNTER — Ambulatory Visit (INDEPENDENT_AMBULATORY_CARE_PROVIDER_SITE_OTHER): Payer: Medicare (Managed Care) | Admitting: Nurse Practitioner

## 2020-03-04 ENCOUNTER — Encounter: Payer: Self-pay | Admitting: Nurse Practitioner

## 2020-03-04 ENCOUNTER — Telehealth: Payer: Self-pay | Admitting: General Surgery

## 2020-03-04 ENCOUNTER — Other Ambulatory Visit (INDEPENDENT_AMBULATORY_CARE_PROVIDER_SITE_OTHER): Payer: Medicare Other

## 2020-03-04 VITALS — BP 110/64 | HR 89 | Ht 63.0 in | Wt 149.0 lb

## 2020-03-04 DIAGNOSIS — R1310 Dysphagia, unspecified: Secondary | ICD-10-CM

## 2020-03-04 DIAGNOSIS — Z860100 Personal history of colon polyps, unspecified: Secondary | ICD-10-CM

## 2020-03-04 DIAGNOSIS — R1013 Epigastric pain: Secondary | ICD-10-CM

## 2020-03-04 DIAGNOSIS — R634 Abnormal weight loss: Secondary | ICD-10-CM

## 2020-03-04 DIAGNOSIS — E876 Hypokalemia: Secondary | ICD-10-CM

## 2020-03-04 LAB — COMPREHENSIVE METABOLIC PANEL
ALT: 15 U/L (ref 0–35)
AST: 19 U/L (ref 0–37)
Albumin: 4 g/dL (ref 3.5–5.2)
Alkaline Phosphatase: 77 U/L (ref 39–117)
BUN: 18 mg/dL (ref 6–23)
CO2: 30 mEq/L (ref 19–32)
Calcium: 9.5 mg/dL (ref 8.4–10.5)
Chloride: 93 mEq/L — ABNORMAL LOW (ref 96–112)
Creatinine, Ser: 1.46 mg/dL — ABNORMAL HIGH (ref 0.40–1.20)
GFR: 43.03 mL/min — ABNORMAL LOW (ref >60.00)
Glucose, Bld: 168 mg/dL — ABNORMAL HIGH (ref 70–99)
Potassium: 2.7 mEq/L — CL (ref 3.5–5.1)
Sodium: 135 mEq/L (ref 135–145)
Total Bilirubin: 0.5 mg/dL (ref 0.2–1.2)
Total Protein: 7.3 g/dL (ref 6.0–8.3)

## 2020-03-04 LAB — CBC WITH DIFFERENTIAL/PLATELET
Basophils Absolute: 0 10*3/uL (ref 0.0–0.1)
Basophils Relative: 0.5 % (ref 0.0–3.0)
Eosinophils Absolute: 0.2 10*3/uL (ref 0.0–0.7)
Eosinophils Relative: 2.1 % (ref 0.0–5.0)
HCT: 43 % (ref 36.0–46.0)
Hemoglobin: 14.2 g/dL (ref 12.0–15.0)
Lymphocytes Relative: 37.1 % (ref 12.0–46.0)
Lymphs Abs: 3.3 10*3/uL (ref 0.7–4.0)
MCHC: 33 g/dL (ref 30.0–36.0)
MCV: 98.4 fl (ref 78.0–100.0)
Monocytes Absolute: 1.2 10*3/uL — ABNORMAL HIGH (ref 0.1–1.0)
Monocytes Relative: 13.5 % — ABNORMAL HIGH (ref 3.0–12.0)
Neutro Abs: 4.2 10*3/uL (ref 1.4–7.7)
Neutrophils Relative %: 46.8 % (ref 43.0–77.0)
Platelets: 213 10*3/uL (ref 150.0–400.0)
RBC: 4.37 Mil/uL (ref 3.87–5.11)
RDW: 14.7 % (ref 11.5–15.5)
WBC: 8.9 10*3/uL (ref 4.0–10.5)

## 2020-03-04 MED ORDER — SUCRALFATE 1 GM/10ML PO SUSP: 1.0000 g | Freq: Two times a day (BID) | ORAL | 0 refills | Status: DC

## 2020-03-04 MED ORDER — OMEPRAZOLE 40 MG PO CPDR
40.0000 mg | DELAYED_RELEASE_CAPSULE | Freq: Every day | ORAL | 0 refills | Status: DC
Start: 2020-03-04 — End: 2020-03-22

## 2020-03-04 NOTE — Patient Instructions (Addendum)
If you are age 68 or older, your body mass index should be between 23-30. Your Body mass index is 26.39 kg/m. If this is out of the aforementioned range listed, please consider follow up with your Primary Care Provider.  If you are age 73 or younger, your body mass index should be between 19-25. Your Body mass index is 26.39 kg/m. If this is out of the aformentioned range listed, please consider follow up with your Primary Care Provider.  Your provider has requested that you go to the basement level for lab work before leaving today. Press "B" on the elevator. The lab is located at the first door on the left as you exit the elevator.  We have sent the following medications to your pharmacy for you to pick up at your convenience: Carafate suspension three times daily Omeprazole 40 mg daily   1. Call Dickson, NP in 2 weeks with weight. 2. Avoid fatty foods. 3. Call if diarrhea increases. 4. Follow up with Dr Hilarie Fredrickson in 4 months, you will need to call the office in January 2022 to schedule.  Due to recent changes in healthcare laws, you may see the results of your imaging and laboratory studies on MyChart before your provider has had a chance to review them.  We understand that in some cases there may be results that are confusing or concerning to you. Not all laboratory results come back in the same time frame and the provider may be waiting for multiple results in order to interpret others.  Please give [place] 48 hours in order for your provider to thoroughly review all the results before contacting the office for clarification of your results.   Thank you for choosing [facility] Noralyn Pick, CRNP

## 2020-03-04 NOTE — Telephone Encounter (Signed)
Spoke with Sau at [facility] lab- critical potasium level given of 2.7.  Read back results to confirm. Gave verbal and routed message to Cottie Banda, NP.

## 2020-03-04 NOTE — Telephone Encounter (Signed)
Per Cottie Banda, CRNP refer to Primary for low potassium. Left a detailed message on the patients voicemail that her potassium is low at 2.7 and she needs to contact her primary Dr to advise them of this.

## 2020-03-04 NOTE — Telephone Encounter (Signed)
**  After Hours Call**  [name] Long calls the after hours line with a question about a lab value. Patient was seen recently by GI and they obtained BMP which showed hypokalemia to 2.7. Patient has long history of hypokalemia and is on HCTZ. She reports some chronic dizziness, no falls, no CP, no palpitations. Patient has prescription for potassium 10 mEq. There are no appointments in our clinic prior to September 7th, which patient already made today. Patient has h/o ckd, cr cl 43 on most recent labs. Recommended patient take 20 mEq of potassium tonight and 20 mEq potassium tomorrow AM. Placed a future lab order for BMP, and made lab visit for tomorrow at 8:30 AM in our lab. Recommend patient hold HCTZ tonight and tomorrow, check her BP at home. If she has any symptoms such as worsening dizziness, lightheadedness, palpitations, CP, recommend she go to [facility] or ED for evaluation. Will send to PCP and have her follow up lab result tomorrow. Precepted with Dr. Owens Shark.  Gladys Damme, MD [facility], PGY-2

## 2020-03-05 NOTE — Progress Notes (Signed)
Addendum: Reviewed and agree with assessment and management plan. Silena Wyss M, MD  

## 2020-03-06 NOTE — Telephone Encounter (Signed)
See phone note 9/1, see lab notes. PCP managing elevated potassium and elevated creatinine level.

## 2020-03-09 ENCOUNTER — Encounter (HOSPITAL_COMMUNITY): Payer: Self-pay | Admitting: Emergency Medicine

## 2020-03-09 ENCOUNTER — Other Ambulatory Visit: Payer: Self-pay

## 2020-03-09 ENCOUNTER — Ambulatory Visit (HOSPITAL_COMMUNITY)
Admission: EM | Admit: 2020-03-09 | Discharge: 2020-03-09 | Disposition: A | Discharge status: Home / Self Care | Payer: Medicare (Managed Care) | Attending: Family Medicine | Admitting: Family Medicine

## 2020-03-09 ENCOUNTER — Encounter (HOSPITAL_COMMUNITY): Payer: Self-pay

## 2020-03-09 ENCOUNTER — Emergency Department (HOSPITAL_COMMUNITY)
Admission: EM | Admit: 2020-03-09 | Discharge: 2020-03-09 | Disposition: A | Discharge status: Home / Self Care | Payer: Medicare (Managed Care) | Attending: Emergency Medicine | Admitting: Emergency Medicine

## 2020-03-09 DIAGNOSIS — E876 Hypokalemia: Secondary | ICD-10-CM

## 2020-03-09 DIAGNOSIS — E1165 Type 2 diabetes mellitus with hyperglycemia: Secondary | ICD-10-CM

## 2020-03-09 DIAGNOSIS — R200 Anesthesia of skin: Secondary | ICD-10-CM

## 2020-03-09 DIAGNOSIS — R000 Tachycardia, unspecified: Secondary | ICD-10-CM

## 2020-03-09 DIAGNOSIS — Z5321 Procedure and treatment not carried out due to patient leaving prior to being seen by health care provider: Secondary | ICD-10-CM | POA: Diagnosis not present

## 2020-03-09 DIAGNOSIS — R42 Dizziness and giddiness: Secondary | ICD-10-CM | POA: Diagnosis not present

## 2020-03-09 DIAGNOSIS — R898 Other abnormal findings in specimens from other organs, systems and tissues: Secondary | ICD-10-CM | POA: Diagnosis present

## 2020-03-09 DIAGNOSIS — I482 Chronic atrial fibrillation, unspecified: Secondary | ICD-10-CM

## 2020-03-09 DIAGNOSIS — R202 Paresthesia of skin: Secondary | ICD-10-CM

## 2020-03-09 LAB — POCT URINALYSIS DIPSTICK, ED / UC
Bilirubin Urine: NEGATIVE
Glucose, UA: >=1000 mg/dL — AB
Hgb urine dipstick: NEGATIVE
Leukocytes,Ua: NEGATIVE
Nitrite: NEGATIVE
Protein, ur: NEGATIVE mg/dL
Specific Gravity, Urine: 1.015 (ref 1.005–1.030)
Urobilinogen, UA: 0.2 mg/dL (ref 0.0–1.0)
pH: 5 (ref 5.0–8.0)

## 2020-03-09 LAB — BASIC METABOLIC PANEL
Anion gap: 12 (ref 5–15)
BUN: 18 mg/dL (ref 8–23)
CO2: 25 mmol/L (ref 22–32)
Calcium: 8.5 mg/dL — ABNORMAL LOW (ref 8.9–10.3)
Chloride: 97 mmol/L — ABNORMAL LOW (ref 98–111)
Creatinine, Ser: 1.58 mg/dL — ABNORMAL HIGH (ref 0.44–1.00)
GFR calc Af Amer: 39 mL/min — ABNORMAL LOW (ref >60)
GFR calc non Af Amer: 33 mL/min — ABNORMAL LOW (ref >60)
Glucose, Bld: 175 mg/dL — ABNORMAL HIGH (ref 70–99)
Potassium: 2.9 mmol/L — ABNORMAL LOW (ref 3.5–5.1)
Sodium: 134 mmol/L — ABNORMAL LOW (ref 135–145)

## 2020-03-09 LAB — CBC
HCT: 39.6 % (ref 36.0–46.0)
Hemoglobin: 13 g/dL (ref 12.0–15.0)
MCH: 31.9 pg (ref 26.0–34.0)
MCHC: 32.8 g/dL (ref 30.0–36.0)
MCV: 97.3 fL (ref 80.0–100.0)
Platelets: 188 10*3/uL (ref 150–400)
RBC: 4.07 MIL/uL (ref 3.87–5.11)
RDW: 13.6 % (ref 11.5–15.5)
WBC: 8 10*3/uL (ref 4.0–10.5)
nRBC: 0 % (ref 0.0–0.2)

## 2020-03-09 LAB — CBG MONITORING, ED: Glucose-Capillary: 172 mg/dL — ABNORMAL HIGH (ref 70–99)

## 2020-03-09 NOTE — Discharge Instructions (Addendum)
Please make sure you follow-up with your primary care provider soon as possible and consider head CT to evaluate your tingling.  We have discussed in clinic today that your blood sugar is very uncontrolled and you have a low potassium level.  These items are not new but absolutely can contribute to your dizziness, numbness, tingling.  He also have a history of atrial fibrillation and today in clinic you have an elevated pulse.  Please continue taking your metoprolol to help with this.  If you develop worsening symptoms please report to the emergency room and do not wait to follow-up with your PCP.

## 2020-03-09 NOTE — ED Triage Notes (Signed)
Pt reports recent hx of low potassium and started on potassium supplements. Reports yesterday she started feeling like her body wouldn't move and she was "doubled over". Pt A&O x 4, ambulatory with cane.

## 2020-03-09 NOTE — ED Notes (Addendum)
Pt not found in waiting room. Called x3

## 2020-03-09 NOTE — ED Provider Notes (Signed)
[name] Long   MRN: [id number] DOB: [birth year]  Subjective:   [name] Long is a 69 y.o. female presenting for 3-week history of persistent dizziness, intermittent numbness and tingling.  Patient states that the episodes last 20 minutes to an hour.  They occur randomly.  Has not tried medications for relief.  Has a history of atrial fibrillation, A. fib with RVR, COPD, CHF, uncontrolled diabetes, CKD 2, essential hypertension.  Patient states that she is reliable with her medications.  Denies any confusion, headache, vision change, chest pain, belly pain, hematuria, rashes.  She has a follow-up appointment with her PCP coming up in 3 days.  Patient did present to the emergency room last night but left without being seen.  She had a basic metabolic panel drawn.  Results are as below.   Current Facility-Administered Medications:  .  0.9 %  sodium chloride infusion, 500 mL, Intravenous, Once, Pyrtle, Lajuan Lines, MD  Current Outpatient Medications:  .  albuterol (VENTOLIN HFA) 108 (90 Base) MCG/ACT inhaler, TAKE 2 PUFFS BY MOUTH EVERY 4 HOURS AS NEEDED FOR WHEEZE, Disp: 18 g, Rfl: 2 .  amLODipine (NORVASC) 5 MG tablet, Take 1 tablet (5 mg total) by mouth daily., Disp: 90 tablet, Rfl: 3 .  Blood Glucose Monitoring Suppl (ACCU-CHEK GUIDE) w/Device KIT, 1 each by Does not apply route 4 (four) times daily -  before meals and at bedtime., Disp: 1 kit, Rfl: 0 .  cyclobenzaprine (FLEXERIL) 10 MG tablet, Take 1 tablet (10 mg total) by mouth 3 (three) times daily as needed for muscle spasms., Disp: 30 tablet, Rfl: 0 .  cyclobenzaprine (FLEXERIL) 5 MG tablet, Take 1 tablet (5 mg total) by mouth 3 (three) times daily as needed for muscle spasms., Disp: 30 tablet, Rfl: 0 .  FARXIGA 10 MG TABS tablet, TAKE 1 TABLET BY MOUTH EVERY DAY, Disp: 30 tablet, Rfl: 1 .  fluconazole (DIFLUCAN) 150 MG tablet, Take one tablet now. If symptoms are not improved in 2 days, you may take the second tablet., Disp: 2 tablet,  Rfl: 0 .  Fluticasone-Umeclidin-Vilant 100-62.5-25 MCG/INH AEPB, Inhale 100 mcg into the lungs daily., Disp: 60 each, Rfl: 3 .  glucose blood test strip, Use to check blood sugar 4x a day Dx Code: E11.22, Disp: 100 each, Rfl: 0 .  hydrochlorothiazide (HYDRODIURIL) 25 MG tablet, TAKE 1 TABLET BY MOUTH EVERY DAY, Disp: 90 tablet, Rfl: 3 .  levothyroxine (SYNTHROID) 25 MCG tablet, Take 1 tablet (25 mcg total) by mouth daily before breakfast., Disp: 90 tablet, Rfl: 3 .  metFORMIN (GLUCOPHAGE) 1000 MG tablet, Take 1 tablet (1,000 mg total) by mouth 2 (two) times daily with a meal., Disp: 60 tablet, Rfl: 11 .  metoprolol succinate (TOPROL-XL) 50 MG 24 hr tablet, Take 50 mg by mouth daily. Take with or immediately following a meal., Disp: , Rfl:  .  metroNIDAZOLE (FLAGYL) 500 MG tablet, Take 1 tablet (500 mg total) by mouth 3 (three) times daily., Disp: 21 tablet, Rfl: 0 .  olopatadine (PATANOL) 0.1 % ophthalmic solution, Place 1 drop into both eyes 2 (two) times daily as needed for allergies., Disp: 5 mL, Rfl: 5 .  omeprazole (PRILOSEC) 40 MG capsule, Take 1 capsule (40 mg total) by mouth daily., Disp: 90 capsule, Rfl: 0 .  rosuvastatin (CRESTOR) 20 MG tablet, Take 1 tablet (20 mg total) by mouth daily., Disp: 30 tablet, Rfl: 11 .  sucralfate (CARAFATE) 1 GM/10ML suspension, Take 10 mLs (1 g total) by mouth 2 (  two) times daily., Disp: 200 mL, Rfl: 0   Allergies  Allergen Reactions  . Citrus Anaphylaxis and Itching  . Fish Allergy Anaphylaxis    Cod  . Shellfish Allergy Shortness Of Breath and Other (See Comments)    "Affects thyroid" also  . Adhesive [Tape] Other (See Comments)    Must have paper tape only  . Latex Dermatitis  . Lipitor [Atorvastatin Calcium] Other (See Comments)    Body aches  . Lisinopril Other (See Comments)    PER DR. Melvyn Novas (not recalled by patient)  . Other Nausea Only and Other (See Comments)    Collards (gas, too)  . Tramadol Palpitations    Past Medical History:   Diagnosis Date  . Arthritis    "real bad; all over" (07/12/2017)  . Asthma   . Atrial fibrillation ([place])   . BIPOLAR DISORDER 08/31/2006   Qualifier: Diagnosis of  By: Dorathy Daft MD, Marjory Lies    . CHRONIC KIDNEY DISEASE STAGE II (MILD) 09/14/2009   Annotation: eGFR 64 Qualifier: Diagnosis of  By: Jess Barters MD, [name]    . Chronic lower back pain   . Congestive heart failure (CHF) ([place])   . COPD (chronic obstructive pulmonary disease) ([place]) 09/23/2010   Diagnosed at [facility] in 2008 (Dr. Annamaria Boots)   . DM (diabetes mellitus) type II controlled with renal manifestation ([place]) 08/31/2006   Qualifier: Diagnosis of  By: Dorathy Daft MD, Marjory Lies    . Dyspnea    "all my life; since 6th grade" (07/12/2017)  . HYPERCHOLESTEROLEMIA 08/31/2006   Intolerance to Lipitor OK on Crestor but medicaid no longer covering    . HYPERTENSION, BENIGN SYSTEMIC 08/31/2006   Qualifier: Diagnosis of  By: Dorathy Daft MD, Marjory Lies    . Hypokalemia   . HYPOTHYROIDISM, UNSPECIFIED 08/31/2006   Qualifier: Diagnosis of  By: Dorathy Daft MD, Marjory Lies    . Leg swelling 03/14/2018  . Pneumonia    "3 times" (07/12/2017)  . Schizophrenia ([place])   . Scoliosis      Past Surgical History:  Procedure Laterality Date  . CESAREAN SECTION    . FOOT FRACTURE SURGERY Right    "steel plate in it"  . FOOT SURGERY     " born w/dislocated foot"  . FRACTURE SURGERY    . KNEE ARTHROSCOPY Right   . TOE SURGERY Bilateral    "both pinky toes"  . TONSILLECTOMY AND ADENOIDECTOMY      Family History  Problem Relation Age of Onset  . Breast cancer Sister   . Heart disease Mother   . Rectal cancer Mother   . Diabetes Father   . Hypertension Father   . Heart disease Brother   . Asthma Daughter   . Asthma Son   . Breast cancer Sister   . Asthma Daughter   . Colon cancer Maternal Grandmother   . Stomach cancer Neg Hx   . Esophageal cancer Neg Hx   . Pancreatic cancer Neg Hx     Social History   Tobacco Use  . Smoking status: Current Some Day Smoker     Packs/day: 0.25    Years: 45.00    Pack years: 11.25    Types: Cigarettes  . Smokeless tobacco: Never Used  . Tobacco comment: patient states she smokes 1 cigarette per month  Vaping Use  . Vaping Use: Never used  Substance Use Topics  . Alcohol use: Not Currently    Comment: 07/12/2017 "stopped 2 yr ago; did have a drink over the holidays recently"  .  Drug use: No    ROS   Objective:   Vitals: BP 116/77   Pulse (!) 120   Temp 98.2 F (36.8 C) (Oral)   Resp 18   Ht '5\' 4"'  (1.626 m)   Wt 158 lb (71.7 kg)   SpO2 100%   BMI 27.12 kg/m   Physical Exam Constitutional:      General: She is not in acute distress.    Appearance: Normal appearance. She is well-developed. She is not ill-appearing, toxic-appearing or diaphoretic.  HENT:     Head: Normocephalic and atraumatic.     Right Ear: External ear normal.     Left Ear: External ear normal.     Nose: Nose normal.     Mouth/Throat:     Mouth: Mucous membranes are moist.  Eyes:     General: No scleral icterus.       Right eye: No discharge.        Left eye: No discharge.     Extraocular Movements: Extraocular movements intact.     Conjunctiva/sclera: Conjunctivae normal.     Pupils: Pupils are equal, round, and reactive to light.  Cardiovascular:     Rate and Rhythm: Normal rate and regular rhythm.     Pulses: Normal pulses.     Heart sounds: Normal heart sounds. No murmur heard.  No friction rub. No gallop.   Pulmonary:     Effort: Pulmonary effort is normal. No respiratory distress.     Breath sounds: Normal breath sounds. No stridor. No wheezing, rhonchi or rales.  Skin:    General: Skin is warm and dry.     Findings: No rash.  Neurological:     Mental Status: She is alert and oriented to person, place, and time.     Cranial Nerves: No cranial nerve deficit.     Motor: No weakness.     Coordination: Coordination normal.     Gait: Gait normal.     Deep Tendon Reflexes: Reflexes normal.  Psychiatric:         Mood and Affect: Mood normal.        Behavior: Behavior normal.        Thought Content: Thought content normal.        Judgment: Judgment normal.     Results for orders placed or performed during the hospital encounter of 03/09/20 (from the past 24 hour(s))  Basic metabolic panel     Status: Abnormal   Collection Time: 03/09/20  2:44 AM  Result Value Ref Range   Sodium 134 (L) 135 - 145 mmol/L   Potassium 2.9 (L) 3.5 - 5.1 mmol/L   Chloride 97 (L) 98 - 111 mmol/L   CO2 25 22 - 32 mmol/L   Glucose, Bld 175 (H) 70 - 99 mg/dL   BUN 18 8 - 23 mg/dL   Creatinine, Ser 1.58 (H) 0.44 - 1.00 mg/dL   Calcium 8.5 (L) 8.9 - 10.3 mg/dL   GFR calc non Af Amer 33 (L) >60 mL/min   GFR calc Af Amer 39 (L) >60 mL/min   Anion gap 12 5 - 15  CBC     Status: None   Collection Time: 03/09/20  2:44 AM  Result Value Ref Range   WBC 8.0 4.0 - 10.5 K/uL   RBC 4.07 3.87 - 5.11 MIL/uL   Hemoglobin 13.0 12.0 - 15.0 g/dL   HCT 39.6 36 - 46 %   MCV 97.3 80.0 - 100.0 fL   MCH 31.9  26.0 - 34.0 pg   MCHC 32.8 30.0 - 36.0 g/dL   RDW 13.6 11.5 - 15.5 %   Platelets 188 150 - 400 K/uL   nRBC 0.0 0.0 - 0.2 %   Results for orders placed or performed during the hospital encounter of 03/09/20 (from the past 24 hour(s))  POC CBG monitoring     Status: Abnormal   Collection Time: 03/09/20  1:15 PM  Result Value Ref Range   Glucose-Capillary 172 (H) 70 - 99 mg/dL  POC Urinalysis dipstick     Status: Abnormal   Collection Time: 03/09/20  1:17 PM  Result Value Ref Range   Glucose, UA >=1000 (A) NEGATIVE mg/dL   Bilirubin Urine NEGATIVE NEGATIVE   Ketones, ur TRACE (A) NEGATIVE mg/dL   Specific Gravity, Urine 1.015 1.005 - 1.030   Hgb urine dipstick NEGATIVE NEGATIVE   pH 5.0 5.0 - 8.0   Protein, ur NEGATIVE NEGATIVE mg/dL   Urobilinogen, UA 0.2 0.0 - 1.0 mg/dL   Nitrite NEGATIVE NEGATIVE   Leukocytes,Ua NEGATIVE NEGATIVE   ED ECG REPORT   Date: 03/09/2020  Rate: 108bpm  Rhythm: sinus tachycardia  QRS  Axis: right  Intervals: normal  ST/T Wave abnormalities: nonspecific T wave changes  Conduction Disutrbances:none  Narrative Interpretation: Sinus tachycardia at 108 bpm, right axis deviation, nonspecific T wave changes. EKG very comparable and unchanged to previous one.  Old EKG Reviewed: unchanged  I have personally reviewed the EKG tracing and agree with the computerized printout as noted.   Assessment and Plan :   PDMP not reviewed this encounter.  1. Dizziness   2. Numbness and tingling   3. Tachycardia   4. Uncontrolled type 2 diabetes mellitus with hyperglycemia ([place])   5. Chronic atrial fibrillation (HCC)   6. Hypokalemia     Discussed with patient the likelihood that her atrial fibrillation, tachycardia and uncontrolled diabetes are the likely source of her symptoms.  She also has persistent hypokalemia, was advised to take double her potassium supplementation by her PCP in this past week.  Recommended patient report to the emergency room for head CT if she would like to rule out stroke.  Otherwise emphasized medical compliance, follow-up with her PCP ASAP. Counseled patient on potential for adverse effects with medications prescribed/recommended today, ER and return-to-clinic precautions discussed, patient verbalized understanding.    Jaynee Eagles, PA-C 03/09/20 1337

## 2020-03-09 NOTE — ED Triage Notes (Signed)
Pt c/o dizziness and intermittent numbness and tingling through entire bodyx3 wks. NIH 0. Pt denies any other sx.

## 2020-03-10 ENCOUNTER — Ambulatory Visit: Payer: Medicare Other

## 2020-03-11 DIAGNOSIS — I10 Essential (primary) hypertension: Secondary | ICD-10-CM | POA: Diagnosis not present

## 2020-03-11 DIAGNOSIS — J208 Acute bronchitis due to other specified organisms: Secondary | ICD-10-CM | POA: Diagnosis not present

## 2020-03-11 DIAGNOSIS — J4520 Mild intermittent asthma, uncomplicated: Secondary | ICD-10-CM | POA: Diagnosis not present

## 2020-03-11 DIAGNOSIS — R0602 Shortness of breath: Secondary | ICD-10-CM | POA: Diagnosis not present

## 2020-03-12 ENCOUNTER — Encounter: Payer: Self-pay | Admitting: Family Medicine

## 2020-03-12 ENCOUNTER — Other Ambulatory Visit: Payer: Self-pay

## 2020-03-12 ENCOUNTER — Ambulatory Visit (INDEPENDENT_AMBULATORY_CARE_PROVIDER_SITE_OTHER): Payer: Medicare Other | Admitting: Family Medicine

## 2020-03-12 VITALS — BP 106/64 | HR 94 | Ht 64.0 in | Wt 150.4 lb

## 2020-03-12 DIAGNOSIS — E876 Hypokalemia: Secondary | ICD-10-CM

## 2020-03-12 DIAGNOSIS — E7800 Pure hypercholesterolemia, unspecified: Secondary | ICD-10-CM

## 2020-03-12 DIAGNOSIS — Z23 Encounter for immunization: Secondary | ICD-10-CM | POA: Diagnosis not present

## 2020-03-12 NOTE — Patient Instructions (Signed)
It was great seeing you today! Today we are getting your blood work done, and you also received the pneumonia vaccine.   Regarding lab work today:  Due to recent changes in healthcare laws, you may see the results of your imaging and laboratory studies on MyChart before your provider has had a chance to review them.  I understand that in some cases there may be results that are confusing or concerning to you. Not all laboratory results come back in the same time frame and you may be waiting for multiple results in order to interpret others.  Please give [place] 72 hours in order for your provider to thoroughly review all the results before contacting the office for clarification of your results. If everything is normal, you will get a letter in the mail or a message in My Chart. Please give [place] a call if you do not hear from [place] after 2 weeks.  Please bring all of your medications with you to each visit.    If you haven't already, sign up for My Chart to have easy access to your labs results, and communication with your primary care physician.  Feel free to call with any questions or concerns at any time, at [phone].   Take care,  Dr. Yehuda [facility]

## 2020-03-13 ENCOUNTER — Telehealth: Payer: Self-pay

## 2020-03-13 LAB — BASIC METABOLIC PANEL
BUN/Creatinine Ratio: 13 (ref 12–28)
BUN: 16 mg/dL (ref 8–27)
CO2: 29 mmol/L (ref 20–29)
Calcium: 9.1 mg/dL (ref 8.7–10.3)
Chloride: 98 mmol/L (ref 96–106)
Creatinine, Ser: 1.25 mg/dL — ABNORMAL HIGH (ref 0.57–1.00)
GFR calc Af Amer: 51 mL/min/{1.73_m2} — ABNORMAL LOW (ref >59)
GFR calc non Af Amer: 44 mL/min/{1.73_m2} — ABNORMAL LOW (ref >59)
Glucose: 95 mg/dL (ref 65–99)
Potassium: 3.5 mmol/L (ref 3.5–5.2)
Sodium: 141 mmol/L (ref 134–144)

## 2020-03-13 LAB — LIPID PANEL
Chol/HDL Ratio: 3.1 ratio (ref 0.0–4.4)
Cholesterol, Total: 148 mg/dL (ref 100–199)
HDL: 48 mg/dL (ref >39)
LDL Chol Calc (NIH): 73 mg/dL (ref 0–99)
Triglycerides: 161 mg/dL — ABNORMAL HIGH (ref 0–149)
VLDL Cholesterol Cal: 27 mg/dL (ref 5–40)

## 2020-03-13 MED ORDER — DAPAGLIFLOZIN PROPANEDIOL 10 MG PO TABS
10.0000 mg | ORAL_TABLET | Freq: Every day | ORAL | 1 refills | Status: DC
Start: 2020-03-13 — End: 2020-04-14

## 2020-03-13 NOTE — Telephone Encounter (Signed)
I called patient and left a message letting her know her potassium was normal and she can go back down to 1. I also refilled her [place]. Patient expressed adamantly yesterday before our visit started that she was changing PCPs because she did not want an intern. I was not able to do a full exam on her and I am not comfortable in continuing to prescribe medication for her if she won't let me examine her and get any information from her. Let me know what needs to be done on my end. I am happy to continue to care for her but I would need to see her again in the clinic before prescribing anything else. Thank you!

## 2020-03-13 NOTE — Telephone Encounter (Signed)
Patient LVM on nurse line requesting yesterdays labs results and any medication changes. Patient reports she takes 2 potassium's, is she to continue, or go back down to 1. Patient is also requesting a refill on Farxiga Please advise.

## 2020-03-13 NOTE — Telephone Encounter (Signed)
Routing to Dr. Gwendlyn Deutscher for review as medical director of [facility].

## 2020-03-14 NOTE — Progress Notes (Signed)
    SUBJECTIVE:   CHIEF COMPLAINT / HPI:   [name] is a 69 yo female who presented for a check up. After introducing myself, patient stated she did not want an intern as her PCP because she did not want someone practicing on her. She stated she wanted a real doctor, and I tried to re assure her that I am a doctor, but she insisted that she would go to the front desk and fill out paper work to switch PCPs after the visit. She requested labs to be done.   PERTINENT  PMH / PSH:   HTN, DM2, acute on chronic congestive heart failure, atrial fibrillation, atrial flutter with RVR, CKD stage 2  OBJECTIVE:   BP 106/64   Pulse 94   Ht 5\' 4"  (1.626 m)   Wt 150 lb 6.4 oz (68.2 kg)   SpO2 97%   BMI 25.82 kg/m    Physical Exam Constitutional:      General: She is not in acute distress.    Appearance: She is not ill-appearing.  Cardiovascular:     Rate and Rhythm: Normal rate and regular rhythm.  Pulmonary:     Effort: Pulmonary effort is normal.     Breath sounds: Normal breath sounds.    ASSESSMENT/PLAN:   No problem-specific Assessment & Plan notes found for this encounter.   Hypokalemia Previous K+ 2.9 on 9/6.  - re-check BMP  - if normal, go back down from 69meq to 58meq potassium daily  DM2 Hgb A1c 12.0 on 8/2.  - offered to do diabetic foot exam but patient declined  - patient requested refill of Farxiga  - f/u with new PCP about diabetes and potentially adjusting medication   Health Maintenance -Pneumococcal vaccine     [place]

## 2020-03-18 NOTE — Telephone Encounter (Addendum)
I contacted this patient regarding the information below.   I introduced myself and ask who I am speaking with for HIPAA compliance purpose but she will not tell me her name.  I then asked to speak with [name] Long and she said, I am speaking with her. I explained to her to confirm that I am speaking with the right person I will like her to confirmed her DOB but she will not.  I left an HIPAA compliant message with the person I spoke with to return our call at her convenience. I will forward message to PCP to follow-up up with her regarding PCP switch request.

## 2020-03-18 NOTE — Telephone Encounter (Signed)
Thank you for attempting to reach out to her Dr. Gwendlyn Deutscher

## 2020-03-22 ENCOUNTER — Other Ambulatory Visit: Payer: Self-pay | Admitting: Gastroenterology

## 2020-03-22 ENCOUNTER — Telehealth: Payer: Self-pay | Admitting: Gastroenterology

## 2020-03-22 DIAGNOSIS — K219 Gastro-esophageal reflux disease without esophagitis: Secondary | ICD-10-CM

## 2020-03-22 MED ORDER — OMEPRAZOLE 40 MG PO CPDR: 40.0000 mg | DELAYED_RELEASE_CAPSULE | Freq: Two times a day (BID) | ORAL | 1 refills | Status: DC

## 2020-03-22 MED ORDER — SUCRALFATE 1 GM/10ML PO SUSP: 1.0000 g | Freq: Two times a day (BID) | ORAL | 3 refills | Status: DC

## 2020-03-22 NOTE — Telephone Encounter (Signed)
Patient called in this evening regarding ongoing dysphagia and reflux. Seen by [name] Long on 9/1. She states reflux symptoms were doing better but now getting worse. Ongoing mild dysphagia, she denies any symptoms of impaction and tolerating PO. Feels fullness in her epigastric area. Has been taking omeprazole once daily, wonders if she can increase back to BID dosing, also asking for refill of liquid carafate as she ran out of that.   I will refill her carafate tonight, also recommend she increase omeprazole to BID if that helps her, and she is requesting a call back from the staff. She wishes to have a follow up with [name] Long, states she will not see a PA again in the future although did not elaborate why.   [name] Long can you please contact this patient to see how she is doing tomorrow (it is currently Sunday night ) and make her a follow up with [name] Long? Thanks

## 2020-03-23 ENCOUNTER — Encounter: Payer: Self-pay | Admitting: Family Medicine

## 2020-03-23 ENCOUNTER — Ambulatory Visit (INDEPENDENT_AMBULATORY_CARE_PROVIDER_SITE_OTHER): Payer: Medicare (Managed Care) | Admitting: Family Medicine

## 2020-03-23 ENCOUNTER — Other Ambulatory Visit: Payer: Self-pay

## 2020-03-23 ENCOUNTER — Telehealth: Payer: Self-pay | Admitting: Family Medicine

## 2020-03-23 VITALS — BP 122/80 | HR 95 | Ht 64.0 in | Wt 146.5 lb

## 2020-03-23 DIAGNOSIS — E1122 Type 2 diabetes mellitus with diabetic chronic kidney disease: Secondary | ICD-10-CM | POA: Diagnosis not present

## 2020-03-23 DIAGNOSIS — N182 Chronic kidney disease, stage 2 (mild): Secondary | ICD-10-CM | POA: Diagnosis not present

## 2020-03-23 LAB — POCT GLYCOSYLATED HEMOGLOBIN (HGB A1C): HbA1c, POC (controlled diabetic range): 10.3 % — AB (ref 0.0–7.0)

## 2020-03-23 NOTE — Telephone Encounter (Signed)
Patient's daughter [name] Long is calling and would like Dr. Ouida Sills to call her to discuss her mothers appointment today. She is concerned about her mothers hallucinations that she thinks are coming from her medications.   Patients daughter states that she is her legal guardian.  The best call back number for [name] Long is [phone]

## 2020-03-23 NOTE — Assessment & Plan Note (Signed)
Patient's HbA1c 12%, on Metformin and Farxiga.  Would benefit from insulin. -Rechecking HbA1c -Checking microalbumin level -Patient still needs foot exam, unable to perform at today's visit

## 2020-03-23 NOTE — Telephone Encounter (Signed)
Spoke with pt and she will pick up the carafate today. Pt scheduled to see Dr. Norman Herrlich 05/08/20@10 :10am. Pt states that is not soon enough, stated I need to do better than that. Explained to pt that is his first available and she will be put on the cancellation list. Pt states she needs to be called back in 2-3 weeks when we can find a sooner appt, Let her know we will call her if we get a cancellation but her appt is 11/5@10 :10am.

## 2020-03-23 NOTE — Progress Notes (Signed)
    SUBJECTIVE:   CHIEF COMPLAINT / HPI:   Type 2 diabetes: Patient with history of type 2 diabetes, Last HbA1c 12% in August.  Was prescribed Farxiga 10 mg, Metformin 1000 mg twice daily.  With HbA1c is elevated as it is, patient would benefit from being put on insulin.  Has history of diabetic neuropathy.  Is due for foot exam today.  Is also due for urine microalbumin.  Desires pregnancy test: This is a 69 year old female presenting to clinic today, desires pregnancy test.  Patient reports that her last menstrual period was in 2001 when she was about 69 years old.  Patient reports she used to be on the Depo shot and knows at that "burns up your ovaries".  Patient also reports that she knows that smoking can prolong menopause, and she has a history of smoking.  I reported to the patient that to my medical knowledge there is no link between smoking history and extending menopause.     PERTINENT  PMH / PSH: Patient Active Problem List   Diagnosis Date Noted  . Abscessed tooth 02/09/2020  . GERD (gastroesophageal reflux disease) 12/27/2019  . Post-menopausal atrophic vaginitis 12/13/2019  . Vagina, candidiasis 12/10/2019  . Vaginal itching 11/10/2019  . Dysphagia 07/02/2019  . Screening examination for STD (sexually transmitted disease) 03/25/2019  . 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 Other chronic pain 12/03/2018  . Dizziness of unknown cause 08/04/2018  . Tremor 07/15/2018  . Atrial flutter with rapid ventricular response ([place]) 07/10/2017  . Atrial fibrillation ([place]) 07/05/2017  . COPD (chronic obstructive pulmonary disease) ([place]) 01/22/2017  . Pedal edema   . Acute on chronic congestive heart failure ([place])   . Chronic bilateral low back pain without sciatica 08/15/2016  . 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 Schizoaffective disorder, bipolar type ([place])   . Osteoarthritis, multiple sites 06/08/2011  . Dysuria 02/14/2011  .  CHRONIC KIDNEY DISEASE STAGE II (MILD) 09/14/2009  . Hypothyroidism 08/31/2006  . Type 2 diabetes mellitus ([place]) 08/31/2006  . HYPERCHOLESTEROLEMIA 08/31/2006  . Tobacco abuse 08/31/2006  . HYPERTENSION, BENIGN SYSTEMIC 08/31/2006     OBJECTIVE:   BP 122/80   Pulse 95   Ht 5\' 4"  (1.626 m)   Wt 146 lb 8 oz (66.5 kg)   SpO2 94%   BMI 25.15 kg/m    Physical exam: General: Well-appearing, no apparent distress Respiratory: Speaking in complete sentences Foot exam: Unable to assess   ASSESSMENT/PLAN:   Type 2 diabetes mellitus ([place]) Patient's HbA1c 12%, on Metformin and Farxiga.  Would benefit from insulin. -Rechecking HbA1c -Checking microalbumin level -Patient still needs foot exam, unable to perform at today's visit   Desires pregnancy test: As patient is 69 years old and last had her menstrual cycle 20 years ago, will not perform pregnancy test today. -Follow-up as needed  Daisy Floro, [place]

## 2020-03-23 NOTE — Telephone Encounter (Signed)
Will forward to MD. [name] Long,CMA  

## 2020-03-24 ENCOUNTER — Ambulatory Visit: Payer: Medicare (Managed Care) | Admitting: Orthopedic Surgery

## 2020-03-24 ENCOUNTER — Telehealth: Payer: Self-pay

## 2020-03-24 ENCOUNTER — Telehealth: Payer: Self-pay | Admitting: Family Medicine

## 2020-03-24 LAB — BASIC METABOLIC PANEL
BUN/Creatinine Ratio: 14 (ref 12–28)
BUN: 25 mg/dL (ref 8–27)
CO2: 26 mmol/L (ref 20–29)
Calcium: 9.1 mg/dL (ref 8.7–10.3)
Chloride: 90 mmol/L — ABNORMAL LOW (ref 96–106)
Creatinine, Ser: 1.85 mg/dL — ABNORMAL HIGH (ref 0.57–1.00)
GFR calc Af Amer: 32 mL/min/{1.73_m2} — ABNORMAL LOW (ref >59)
GFR calc non Af Amer: 28 mL/min/{1.73_m2} — ABNORMAL LOW (ref >59)
Glucose: 129 mg/dL — ABNORMAL HIGH (ref 65–99)
Potassium: 2.9 mmol/L — ABNORMAL LOW (ref 3.5–5.2)
Sodium: 137 mmol/L (ref 134–144)

## 2020-03-24 LAB — MICROALBUMIN / CREATININE URINE RATIO
Creatinine, Urine: 129.4 mg/dL
Microalb/Creat Ratio: 103 mg/g creat — ABNORMAL HIGH (ref 0–29)
Microalbumin, Urine: 133 ug/mL

## 2020-03-24 NOTE — Telephone Encounter (Signed)
I don't think she was ever switched. Need to have conversation with her regarding her switch request first. She will not allow conversation to carry on when I called her. Will need to address when she comes in.
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
## 2020-03-24 NOTE — Telephone Encounter (Signed)
Patient calls nurse line requesting lab results from yesterday's visit. Patient is also requesting refill on HCTZ. Informed patient that she had refills sent on medication and to call pharmacy.   Please return call to patient with lab results.   Forwarding to PCP and Dr. Lemmie Evens. Valentina Lucks, RN

## 2020-03-26 ENCOUNTER — Ambulatory Visit: Payer: Medicare (Managed Care)

## 2020-03-26 ENCOUNTER — Telehealth: Payer: Self-pay | Admitting: Family Medicine

## 2020-03-26 NOTE — Telephone Encounter (Signed)
Received a page to the after-hours line from the patient.  When I called her back she reported she had a question regarding if there was a machine that could "suck the oxygen out of her brain causing her not to think well".  She then started talking about how she thought someone was using this machine on her to make her not think right.  She also reported that she had fallen earlier in the day and that it was "because my sister move my socks".  She reports she is not having any pain from the fall, she did not hit her head or lose consciousness.  She denies any chest pain, shortness of breath, nausea, vomiting.  She reports she is up and walking around her house and that she is not going to go to the hospital because she does not want to catch Covid.  She has an appointment scheduled with her access to care clinic tomorrow.  I asked her permission to call her daughter to speak with her about the visit and discuss why she called the after-hours line.  I called patient's daughter Jonelle Sidle who was at the time on the phone with her sister [name].  I spoke with Cassandra 2 days ago about concerns about her mother's mental status.  She emerged the call and we had a discussion about neck steps in care for their mother.  I discussed the importance of making sure that she attends the visit tomorrow so that she can have a treatment for her low potassium but more importantly be assessed for her memory issues.  I mentioned geriatric clinic with the patient daughters and they seemed hesitant but were open to the possibility.  They had questions regarding a geriatrician and a geriatric psychiatrist.  They report that she does have schizoaffective disorder and was previously on medications but those medications were discontinued sometime ago due to her age.  She is on no psychotropic medications at this time.  They reported that they would go check on her to make sure her mother is okay and they will be sure to bring her to the  visit tomorrow.  Strict hospital precautions were given to the patient's daughters as well as the patient.

## 2020-03-27 ENCOUNTER — Other Ambulatory Visit: Payer: Self-pay

## 2020-03-27 ENCOUNTER — Ambulatory Visit (INDEPENDENT_AMBULATORY_CARE_PROVIDER_SITE_OTHER): Payer: Medicare Other | Admitting: Family Medicine

## 2020-03-27 VITALS — BP 131/80 | HR 100 | Ht 65.0 in | Wt 142.4 lb

## 2020-03-27 DIAGNOSIS — E876 Hypokalemia: Secondary | ICD-10-CM | POA: Diagnosis not present

## 2020-03-27 DIAGNOSIS — F259 Schizoaffective disorder, unspecified: Secondary | ICD-10-CM

## 2020-03-27 DIAGNOSIS — F0281 Dementia in other diseases classified elsewhere with behavioral disturbance: Secondary | ICD-10-CM | POA: Diagnosis not present

## 2020-03-27 DIAGNOSIS — F02818 Dementia in other diseases classified elsewhere, unspecified severity, with other behavioral disturbance: Secondary | ICD-10-CM | POA: Insufficient documentation

## 2020-03-27 MED ORDER — POTASSIUM CHLORIDE ER 10 MEQ PO TBCR: 10.0000 meq | EXTENDED_RELEASE_TABLET | Freq: Every day | ORAL | 2 refills | Status: DC

## 2020-03-27 NOTE — Assessment & Plan Note (Signed)
The patient is exhibiting acute psychosis, most likely related to her schizoaffective disorder which is currently being untreated.  The patient has a legal guardian, her daughter [name] Long, and also lacks capacity for medical decision-making.  I provided the daughter [name] Long with behavioral health resources and instructed her to seek help from anyone of the resources below as soon as possible. -[name] Long will keep me in the loop with where she is in the process of being reestablished with psychiatry

## 2020-03-27 NOTE — Progress Notes (Signed)
SUBJECTIVE:   CHIEF COMPLAINT / HPI:   Behavioral Concerns: [facility] concerns", she is assisted by her daughter and legal guardian Kamoria Lucien.  The patient has a history of schizoaffective disorder.  She is recently been exhibiting disordered thinking and hallucinations consistent with psychosis.  The patient does not currently have a psychiatrist, is not currently on any medications.  Used to be on Depakote and risperidone when she was at [employer].  She was taken off these medications about 5-6 years ago, according to the patient.  Denies any SI, HI.  Hypokalemia: Patient was recently seen in the clinic, BMP was checked and potassium was noted to be low at about 2.9.  The patient takes HCTZ, was previously on potassium 10 mEq daily.  Patient reports she has been taking these medications however these medications were previously discontinued.  Denies any shortness of breath or chest pain.  PERTINENT  PMH / PSH: Patient Active Problem List   Diagnosis Date Noted  . Dementia associated with other underlying disease with behavioral disturbance ([place]) 03/27/2020  . Abscessed tooth 02/09/2020  . GERD (gastroesophageal reflux disease) 12/27/2019  . Post-menopausal atrophic vaginitis 12/13/2019  . Vagina, candidiasis 12/10/2019  . Vaginal itching 11/10/2019  . Dysphagia 07/02/2019  . Screening examination for STD (sexually transmitted disease) 03/25/2019  . 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 Other chronic pain 12/03/2018  . Dizziness of unknown cause 08/04/2018  . Tremor 07/15/2018  . Atrial flutter with rapid ventricular response ([place]) 07/10/2017  . Atrial fibrillation ([place]) 07/05/2017  . COPD (chronic obstructive pulmonary disease) ([place]) 01/22/2017  . Pedal edema   . Acute on chronic congestive heart failure ([place])   . Chronic bilateral low back pain without sciatica 08/15/2016  . Idiopathic chronic venous hypertension  of left lower extremity with inflammation 08/15/2016  . Schizoaffective disorder ([place])   . Hypokalemia 01/22/2015  . Osteoarthritis, multiple sites 06/08/2011  . Dysuria 02/14/2011  . CHRONIC KIDNEY DISEASE STAGE II (MILD) 09/14/2009  . Hypothyroidism 08/31/2006  . Type 2 diabetes mellitus ([place]) 08/31/2006  . HYPERCHOLESTEROLEMIA 08/31/2006  . Tobacco abuse 08/31/2006  . HYPERTENSION, BENIGN SYSTEMIC 08/31/2006     OBJECTIVE:   BP 131/80   Pulse 100   Ht 5\' 5"  (1.651 m)   Wt 142 lb 6.4 oz (64.6 kg)   SpO2 97%   BMI 23.70 kg/m    Physical exam: General: Displays disordered thinking, hesitates to contribute to the encounter, desires female doctor Respiratory: Speaking complete sentences  ASSESSMENT/PLAN:   Schizoaffective disorder ([place]) The patient is exhibiting acute psychosis, most likely related to her schizoaffective disorder which is currently being untreated.  The patient has a legal guardian, her daughter [name], and also lacks capacity for medical decision-making.  I provided the daughter [name] with behavioral health resources and instructed her to seek help from anyone of the resources below as soon as possible. -Cassandra will keep me in the loop with where she is in the process of being reestablished with psychiatry  Hypokalemia Patient with recent value of potassium 2.9 on BMP. -Represcribed Klor-Con, recommended 20 mEq daily for 1 week, then 1 tablet daily after that -Asked to come back in 2 weeks for recheck BMP  Dementia associated with other underlying disease with behavioral disturbance [facility]) Patient is becoming increasingly difficult to take care of.  Is displaying some symptoms of dementia which is also being complicated by her schizoaffective disorder.  Her legal guardian [name]  tells me that she was taken off of her psychiatric medications due to her age from her previous psychiatrist.  [name] is asking that she be referred to a geriatrician for  further evaluation, management. -Referral to geriatrician made at today's appointment -Recommended Cassandra reach out to behavioral health in the interim as she awaits the referral to geriatrics     Daisy Floro, [place]

## 2020-03-27 NOTE — Patient Instructions (Addendum)
Thank you for coming in to see [place] today! Please see below to review our plan for today's visit:  1. Reach out to Behavioral Health to get her established. 2. Look out for referral from Geriatrics.  3. Take 2 tablets of potassium daily for 1 weeks, then 1 tablet daily there after. Come back in about 2 weeks to recheck Potassium.   My concern is [name] Long will need more care and attention from here on out. First priority is getting her established with psychiatric care and getting that in better control.  Please call the clinic at [phone] if your symptoms worsen or you have any concerns. It was our pleasure to serve you!   Dr. Milus Banister [facility]

## 2020-03-27 NOTE — Assessment & Plan Note (Signed)
Patient is becoming increasingly difficult to take care of.  Is displaying some symptoms of dementia which is also being complicated by her schizoaffective disorder.  Her legal guardian [name] Long tells me that she was taken off of her psychiatric medications due to her age from her previous psychiatrist.  [name] Long is asking that she be referred to a geriatrician for further evaluation, management. -Referral to geriatrician made at today's appointment -Recommended [name] Long reach out to behavioral health in the interim as she awaits the referral to geriatrics

## 2020-03-27 NOTE — Assessment & Plan Note (Signed)
Patient with recent value of potassium 2.9 on BMP. -Represcribed Klor-Con, recommended 20 mEq daily for 1 week, then 1 tablet daily after that -Asked to come back in 2 weeks for recheck BMP

## 2020-03-28 ENCOUNTER — Telehealth: Payer: Self-pay | Admitting: Family Medicine

## 2020-03-28 NOTE — Telephone Encounter (Signed)
**   After Hours Call **  Patient called after hours emergency line. Attempted to call back, left VM x2. No answer.  Gladys Damme, MD [facility], PGY-2

## 2020-03-30 ENCOUNTER — Telehealth: Payer: Self-pay | Admitting: Family Medicine

## 2020-03-30 ENCOUNTER — Telehealth: Payer: Self-pay | Admitting: Nurse Practitioner

## 2020-03-30 ENCOUNTER — Ambulatory Visit: Payer: Medicare Other | Admitting: Nurse Practitioner

## 2020-03-30 ENCOUNTER — Other Ambulatory Visit: Payer: Self-pay

## 2020-03-30 DIAGNOSIS — E876 Hypokalemia: Secondary | ICD-10-CM

## 2020-03-30 NOTE — Telephone Encounter (Signed)
Please forward to her current PCP. I need to discuss PCP switch process with her before switch. The last time I called, she did not allow further discussion. Have her call me to discuss at her convenience.

## 2020-03-30 NOTE — Telephone Encounter (Signed)
Pt is requesting a medication refill on her CARAFATE

## 2020-03-30 NOTE — Telephone Encounter (Signed)
Patient calls nurse line requesting medication refills.   Patient is also asking if PCP has been changed. Advised patient of current provider still being listed as Dr. Arby Barrette. Patient is requesting Dr. Susa Simmonds as her PCP or a female provider. Patient states that Dr. Ouida Sills is her daughter's doctor and she would prefer Dr. Susa Simmonds as she has seen her in the past.   Will forward to Dr. Hinda Kehr, RN
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
## 2020-03-31 ENCOUNTER — Encounter: Payer: Self-pay | Admitting: *Deleted

## 2020-03-31 MED ORDER — POTASSIUM CHLORIDE ER 10 MEQ PO TBCR: 10.0000 meq | EXTENDED_RELEASE_TABLET | Freq: Every day | ORAL | 2 refills | Status: DC

## 2020-03-31 MED ORDER — LEVOTHYROXINE SODIUM 25 MCG PO TABS
25.0000 ug | ORAL_TABLET | Freq: Every day | ORAL | 3 refills | Status: DC
Start: 2020-03-31 — End: 2020-04-16

## 2020-03-31 MED ORDER — METFORMIN HCL 1000 MG PO TABS
1000.0000 mg | ORAL_TABLET | Freq: Two times a day (BID) | ORAL | 11 refills | Status: DC
Start: 2020-03-31 — End: 2020-04-16

## 2020-03-31 NOTE — Telephone Encounter (Signed)
Spoke with Natale at [facility]- the patient has a refill on Carafate left. Cannot be refilled- to soon.

## 2020-03-31 NOTE — Telephone Encounter (Signed)
Contacted the patient and stated that [facility] is at [facility] but is too early to be filled.

## 2020-03-31 NOTE — Telephone Encounter (Signed)
I can fill these until her PCP is officially changed. Thanks [employer]

## 2020-04-01 ENCOUNTER — Ambulatory Visit: Payer: Medicare (Managed Care) | Admitting: Physician Assistant

## 2020-04-01 NOTE — Telephone Encounter (Signed)
Pt called inquiring about rf for sulcrafate. I told pt that she still has a rf but it is too soon to rf. Pt then stated that she had to throw away the last rf she got because medication was "bad." I told pt that we will have to check with her pharmacy to see if they can rf soon. Pt got upset and stated that she wants to speak with Dr. Hilarie Fredrickson about this.

## 2020-04-03 ENCOUNTER — Ambulatory Visit (HOSPITAL_COMMUNITY)
Admission: EM | Admit: 2020-04-03 | Discharge: 2020-04-03 | Discharge status: Against Medical Advice | Payer: Medicare (Managed Care)

## 2020-04-03 ENCOUNTER — Other Ambulatory Visit: Payer: Self-pay

## 2020-04-03 ENCOUNTER — Emergency Department (HOSPITAL_COMMUNITY)
Admission: EM | Admit: 2020-04-03 | Discharge: 2020-04-09 | Disposition: A | Discharge status: Home / Self Care | Payer: Medicare (Managed Care) | Attending: Emergency Medicine | Admitting: Emergency Medicine

## 2020-04-03 DIAGNOSIS — F17210 Nicotine dependence, cigarettes, uncomplicated: Secondary | ICD-10-CM | POA: Insufficient documentation

## 2020-04-03 DIAGNOSIS — E039 Hypothyroidism, unspecified: Secondary | ICD-10-CM | POA: Diagnosis not present

## 2020-04-03 DIAGNOSIS — Z7984 Long term (current) use of oral hypoglycemic drugs: Secondary | ICD-10-CM | POA: Diagnosis not present

## 2020-04-03 DIAGNOSIS — F22 Delusional disorders: Secondary | ICD-10-CM | POA: Diagnosis not present

## 2020-04-03 DIAGNOSIS — N182 Chronic kidney disease, stage 2 (mild): Secondary | ICD-10-CM | POA: Diagnosis not present

## 2020-04-03 DIAGNOSIS — I509 Heart failure, unspecified: Secondary | ICD-10-CM | POA: Insufficient documentation

## 2020-04-03 DIAGNOSIS — E876 Hypokalemia: Secondary | ICD-10-CM | POA: Insufficient documentation

## 2020-04-03 DIAGNOSIS — E119 Type 2 diabetes mellitus without complications: Secondary | ICD-10-CM | POA: Insufficient documentation

## 2020-04-03 DIAGNOSIS — R45851 Suicidal ideations: Secondary | ICD-10-CM | POA: Diagnosis not present

## 2020-04-03 DIAGNOSIS — J45909 Unspecified asthma, uncomplicated: Secondary | ICD-10-CM | POA: Diagnosis not present

## 2020-04-03 DIAGNOSIS — Z20822 Contact with and (suspected) exposure to covid-19: Secondary | ICD-10-CM | POA: Diagnosis not present

## 2020-04-03 DIAGNOSIS — Z79899 Other long term (current) drug therapy: Secondary | ICD-10-CM | POA: Diagnosis not present

## 2020-04-03 DIAGNOSIS — I130 Hypertensive heart and chronic kidney disease with heart failure and stage 1 through stage 4 chronic kidney disease, or unspecified chronic kidney disease: Secondary | ICD-10-CM | POA: Diagnosis not present

## 2020-04-03 DIAGNOSIS — Z91040 Latex allergy status: Secondary | ICD-10-CM | POA: Insufficient documentation

## 2020-04-03 DIAGNOSIS — R443 Hallucinations, unspecified: Secondary | ICD-10-CM | POA: Diagnosis present

## 2020-04-03 LAB — RAPID URINE DRUG SCREEN, HOSP PERFORMED
Amphetamines: NOT DETECTED
Barbiturates: NOT DETECTED
Benzodiazepines: NOT DETECTED
Cocaine: NOT DETECTED
Opiates: NOT DETECTED
Tetrahydrocannabinol: NOT DETECTED

## 2020-04-03 LAB — COMPREHENSIVE METABOLIC PANEL
ALT: 16 U/L (ref 0–44)
AST: 21 U/L (ref 15–41)
Albumin: 3.3 g/dL — ABNORMAL LOW (ref 3.5–5.0)
Alkaline Phosphatase: 76 U/L (ref 38–126)
Anion gap: 13 (ref 5–15)
BUN: 23 mg/dL (ref 8–23)
CO2: 27 mmol/L (ref 22–32)
Calcium: 9.2 mg/dL (ref 8.9–10.3)
Chloride: 93 mmol/L — ABNORMAL LOW (ref 98–111)
Creatinine, Ser: 1.89 mg/dL — ABNORMAL HIGH (ref 0.44–1.00)
GFR calc Af Amer: 31 mL/min — ABNORMAL LOW (ref >60)
GFR calc non Af Amer: 27 mL/min — ABNORMAL LOW (ref >60)
Glucose, Bld: 241 mg/dL — ABNORMAL HIGH (ref 70–99)
Potassium: 2.2 mmol/L — CL (ref 3.5–5.1)
Sodium: 133 mmol/L — ABNORMAL LOW (ref 135–145)
Total Bilirubin: 0.9 mg/dL (ref 0.3–1.2)
Total Protein: 7.1 g/dL (ref 6.5–8.1)

## 2020-04-03 LAB — RESPIRATORY PANEL BY RT PCR (FLU A&B, COVID)
Influenza A by PCR: NEGATIVE
Influenza B by PCR: NEGATIVE
SARS Coronavirus 2 by RT PCR: NEGATIVE

## 2020-04-03 LAB — CBC
HCT: 36.8 % (ref 36.0–46.0)
Hemoglobin: 12.2 g/dL (ref 12.0–15.0)
MCH: 31 pg (ref 26.0–34.0)
MCHC: 33.2 g/dL (ref 30.0–36.0)
MCV: 93.4 fL (ref 80.0–100.0)
Platelets: 243 10*3/uL (ref 150–400)
RBC: 3.94 MIL/uL (ref 3.87–5.11)
RDW: 13.4 % (ref 11.5–15.5)
WBC: 6.7 10*3/uL (ref 4.0–10.5)
nRBC: 0 % (ref 0.0–0.2)

## 2020-04-03 LAB — SALICYLATE LEVEL: Salicylate Lvl: <7 mg/dL — ABNORMAL LOW (ref 7.0–30.0)

## 2020-04-03 LAB — ETHANOL: Alcohol, Ethyl (B): <10 mg/dL (ref <10)

## 2020-04-03 LAB — ACETAMINOPHEN LEVEL: Acetaminophen (Tylenol), Serum: <10 ug/mL — ABNORMAL LOW (ref 10–30)

## 2020-04-03 MED ORDER — HYDROCHLOROTHIAZIDE 25 MG PO TABS
25.0000 mg | ORAL_TABLET | Freq: Every day | ORAL | Status: DC
  Administered 2020-04-04: 25 mg via ORAL
  Filled 2020-04-03: qty 1

## 2020-04-03 MED ORDER — ALBUTEROL SULFATE HFA 108 (90 BASE) MCG/ACT IN AERS
2.0000 | INHALATION_SPRAY | Freq: Four times a day (QID) | RESPIRATORY_TRACT | Status: DC | PRN
  Filled 2020-04-03: qty 6.7

## 2020-04-03 MED ORDER — POTASSIUM CHLORIDE 10 MEQ/100ML IV SOLN
10.0000 meq | INTRAVENOUS | Status: AC
  Administered 2020-04-03 (×3): 10 meq via INTRAVENOUS
  Filled 2020-04-03 (×3): qty 100

## 2020-04-03 MED ORDER — ROSUVASTATIN CALCIUM 20 MG PO TABS
20.0000 mg | ORAL_TABLET | Freq: Every day | ORAL | Status: DC
  Administered 2020-04-04 – 2020-04-08 (×4): 20 mg via ORAL
  Filled 2020-04-03: qty 1
  Filled 2020-04-03 (×2): qty 4
  Filled 2020-04-03: qty 1
  Filled 2020-04-03: qty 4

## 2020-04-03 MED ORDER — POTASSIUM CHLORIDE CRYS ER 20 MEQ PO TBCR
40.0000 meq | EXTENDED_RELEASE_TABLET | Freq: Once | ORAL | Status: AC
  Administered 2020-04-03: 40 meq via ORAL
  Filled 2020-04-03: qty 2

## 2020-04-03 MED ORDER — METFORMIN HCL 500 MG PO TABS
1000.0000 mg | ORAL_TABLET | Freq: Two times a day (BID) | ORAL | Status: DC
  Administered 2020-04-04 – 2020-04-08 (×9): 1000 mg via ORAL
  Filled 2020-04-03 (×10): qty 2

## 2020-04-03 MED ORDER — PANTOPRAZOLE SODIUM 40 MG PO TBEC
40.0000 mg | DELAYED_RELEASE_TABLET | Freq: Every day | ORAL | Status: DC
  Administered 2020-04-05 – 2020-04-08 (×3): 40 mg via ORAL
  Filled 2020-04-03 (×5): qty 1

## 2020-04-03 MED ORDER — MAGNESIUM SULFATE 2 GM/50ML IV SOLN
2.0000 g | Freq: Once | INTRAVENOUS | Status: AC
  Administered 2020-04-03: 2 g via INTRAVENOUS
  Filled 2020-04-03: qty 50

## 2020-04-03 MED ORDER — AMLODIPINE BESYLATE 5 MG PO TABS
5.0000 mg | ORAL_TABLET | Freq: Every day | ORAL | Status: DC
  Administered 2020-04-04 – 2020-04-08 (×4): 5 mg via ORAL
  Filled 2020-04-03 (×5): qty 1

## 2020-04-03 MED ORDER — SUCRALFATE 1 GM/10ML PO SUSP
1.0000 g | Freq: Two times a day (BID) | ORAL | Status: DC
  Administered 2020-04-05 – 2020-04-08 (×7): 1 g via ORAL
  Filled 2020-04-03 (×14): qty 10

## 2020-04-03 MED ORDER — DAPAGLIFLOZIN PROPANEDIOL 10 MG PO TABS: 10.0000 mg | ORAL_TABLET | Freq: Every day | ORAL | Status: DC

## 2020-04-03 MED ORDER — LEVOTHYROXINE SODIUM 25 MCG PO TABS
25.0000 ug | ORAL_TABLET | Freq: Every day | ORAL | Status: DC
  Administered 2020-04-04 – 2020-04-08 (×5): 25 ug via ORAL
  Filled 2020-04-03 (×5): qty 1

## 2020-04-03 NOTE — ED Triage Notes (Signed)
Pt bib gpd under IVC by pts family who reports SI and hallucinations. Pt irritable in triage

## 2020-04-03 NOTE — ED Notes (Signed)
RN called staffing requesting sitter for pt d/t high fall risk. NO sitters available at this time. Staff to send next available sitter
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
## 2020-04-03 NOTE — ED Provider Notes (Signed)
Pt presents with multiple problems, she has known hypokalemia - measured at 2.2 this evening - thankfully EKG shows no prolonged QTC measured at 447.  She has psychosis and has stated suicidality prior to arrival -a rrivese with IVC, well appearing and comfortable but needs acute medical treatment with Mg and K replacement and consultaiton with psychiatry.  Medical screening examination/treatment/procedure(s) were conducted as a shared visit with non-physician practitioner(s) and myself.  I personally evaluated the patient during the encounter.  Clinical Impression:   Final diagnoses:  None   ED ECG REPORT  I personally interpreted this EKG   Date: 04/03/2020 21:53 PM  Rate: 80  Rhythm: normal sinus rhythm  QRS Axis: normal  Intervals: normal  ST/T Wave abnormalities: normal  Conduction Disutrbances:none  Narrative Interpretation:   Old EKG Reviewed: none available    [name], MD 04/04/20 [phone]

## 2020-04-03 NOTE — Progress Notes (Signed)
PHARMACIST - PHYSICIAN COMMUNICATION  CONCERNING:  dapagliflozin   DESCRIPTION:  Pt's current CrCl is 25 ml/min (SCr 1.89, baseline closer to 1.5).  Dapagliflozin has been d/c'd.  Please resume when CrCl >30.   Wynona Neat, PharmD, BCPS 04/04/2020 12:06 AM

## 2020-04-03 NOTE — ED Notes (Signed)
Daughter [name] and Mosetta Anis would like update when available.

## 2020-04-03 NOTE — ED Provider Notes (Signed)
[facility] EMERGENCY DEPARTMENT Provider Note   CSN: [id number] Arrival date & time: 04/03/20  1734     History No chief complaint on file.   [name] Long is a 69 y.o. female presenting under IVC for psychaitric evaluation.   Pt states she does not need to be here, and everything is fine. She then tells me she does not need psychiatric evaluation. She has people coming into hr house when she is at doctors' appointment.s he reports her family is using her insurance checks for a vendetta against other people. she reports everyone around her is doing drugs and having sex, but not her.  When asked, pt states she is not taking all her medicines because some of them are yellow, not pick, so she knows they're not right.   Additional history obtained from chart review. Pt's IVC paperwork reports pt is having SI and hallucinations. Per chart review, pt told her PCP that she has thrown away multiple medications due to them being not right. Pt has a h/o hypothyroidism, DM, hyperlipidemia, htn, ckd, schizoaffective disorder, copd, chf, a fib not on anticoagulation (pt refused), bipolar, dementia.   Additional history obtained from patient's daughter, Shantal Roan, who is also her legal guardian.  Per daughter, 1.5 yrs ago pt's psych meds were stopped after pt missed an apt with [facility]. Recently she called again tor restart, but was told she needs reevaluation. 1 month ago, patient became "off," talking in circles. Daughter states she is talking about people who have died or from way in the past. patient has been calling the police saying people are poisoning her, hurting her, and sucking oxygen out of her brain. Calling [facility] after hours frequently. She is throwing away medicine saying they "taste sweet."   HPI     Past Medical History:  Diagnosis Date  . Adenomatous colon polyp   . Arthritis    "real bad; all over" (07/12/2017)  . Asthma   . Atrial  fibrillation ([place])   . BIPOLAR DISORDER 08/31/2006   Qualifier: Diagnosis of  By: Dorathy Daft MD, Marjory Lies    . CHRONIC KIDNEY DISEASE STAGE II (MILD) 09/14/2009   Annotation: eGFR 64 Qualifier: Diagnosis of  By: Jess Barters MD, [name]    . Chronic lower back pain   . Congestive heart failure (CHF) ([place])   . COPD (chronic obstructive pulmonary disease) ([place]) 09/23/2010   Diagnosed at [facility] in [facility] (Dr. Annamaria Boots)   . DM (diabetes mellitus) type II controlled with renal manifestation ([place]) 08/31/2006   Qualifier: Diagnosis of  By: Dorathy Daft MD, Marjory Lies    . Dyspnea    "all my life; since 6th grade" (07/12/2017)  . Fatty liver   . HYPERCHOLESTEROLEMIA 08/31/2006   Intolerance to Lipitor OK on Crestor but medicaid no longer covering    . HYPERTENSION, BENIGN SYSTEMIC 08/31/2006   Qualifier: Diagnosis of  By: Dorathy Daft MD, Marjory Lies    . Hypokalemia   . HYPOTHYROIDISM, UNSPECIFIED 08/31/2006   Qualifier: Diagnosis of  By: Dorathy Daft MD, Marjory Lies    . Leg swelling 03/14/2018  . Pneumonia    "3 times" (07/12/2017)  . Schizophrenia ([place])   . Scoliosis     Patient Active Problem List   Diagnosis Date Noted  . Dementia associated with other underlying disease with behavioral disturbance ([place]) 03/27/2020  . Abscessed tooth 02/09/2020  . GERD (gastroesophageal reflux disease) 12/27/2019  . Post-menopausal atrophic vaginitis 12/13/2019  . Vagina, candidiasis 12/10/2019  . Vaginal itching 11/10/2019  .  Dysphagia 07/02/2019  . Screening examination for STD (sexually transmitted disease) 03/25/2019  . 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 Other chronic pain 12/03/2018  . Dizziness of unknown cause 08/04/2018  . Tremor 07/15/2018  . Atrial flutter with rapid ventricular response ([place]) 07/10/2017  . Atrial fibrillation ([place]) 07/05/2017  . COPD (chronic obstructive pulmonary disease) ([place]) 01/22/2017  . Pedal edema   . Acute on chronic congestive heart failure ([place])   . Chronic bilateral low back pain without sciatica  08/15/2016  . 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 Schizoaffective disorder ([place])   . Hypokalemia 01/22/2015  . Osteoarthritis, multiple sites 06/08/2011  . Dysuria 02/14/2011  . CHRONIC KIDNEY DISEASE STAGE II (MILD) 09/14/2009  . Hypothyroidism 08/31/2006  . Type 2 diabetes mellitus ([place]) 08/31/2006  . HYPERCHOLESTEROLEMIA 08/31/2006  . Tobacco abuse 08/31/2006  . HYPERTENSION, BENIGN SYSTEMIC 08/31/2006    Past Surgical History:  Procedure Laterality Date  . CESAREAN SECTION    . FOOT FRACTURE SURGERY Right    "steel plate in it"  . FOOT SURGERY     " born w/dislocated foot"  . FRACTURE SURGERY    . KNEE ARTHROSCOPY Right   . TOE SURGERY Bilateral    "both pinky toes"  . TONSILLECTOMY AND ADENOIDECTOMY       OB History   No obstetric history on file.     Family History  Problem Relation Age of Onset  . Breast cancer Sister   . Heart disease Mother   . Rectal cancer Mother   . Diabetes Father   . Hypertension Father   . Heart disease Brother   . Asthma Daughter   . Asthma Son   . Breast cancer Sister   . Asthma Daughter   . Colon cancer Maternal Grandmother   . Stomach cancer Neg Hx   . Esophageal cancer Neg Hx   . Pancreatic cancer Neg Hx     Social History   Tobacco Use  . Smoking status: Current Some Day Smoker    Packs/day: 0.25    Years: 45.00    Pack years: 11.25    Types: Cigarettes  . Smokeless tobacco: Never Used  . Tobacco comment: patient states she smokes 1 cigarette per month  Vaping Use  . Vaping Use: Never used  Substance Use Topics  . Alcohol use: Not Currently    Comment: 07/12/2017 "stopped 2 yr ago; did have a drink over the holidays recently"  . Drug use: No    Home Medications Prior to Admission medications   Medication Sig Start Date End Date Taking? Authorizing Provider  albuterol (VENTOLIN HFA) 108 (90 Base) MCG/ACT inhaler TAKE 2 PUFFS BY MOUTH EVERY 4 HOURS AS  NEEDED FOR WHEEZE 12/30/19   Guadalupe Dawn, MD  amLODipine (NORVASC) 5 MG tablet Take 1 tablet (5 mg total) by mouth daily. 05/31/19   Daisy Floro, DO  Blood Glucose Monitoring Suppl (ACCU-CHEK GUIDE) w/Device KIT 1 each by Does not apply route 4 (four) times daily -  before meals and at bedtime. 11/25/19   Guadalupe Dawn, MD  cyclobenzaprine (FLEXERIL) 10 MG tablet Take 1 tablet (10 mg total) by mouth 3 (three) times daily as needed for muscle spasms. 01/17/20   Newt Minion, MD  cyclobenzaprine (FLEXERIL) 5 MG tablet Take 1 tablet (5 mg total) by mouth 3 (three) times daily as needed for muscle spasms. 01/17/20   Newt Minion, MD  dapagliflozin propanediol (FARXIGA) 10 MG TABS  tablet Take 1 tablet (10 mg total) by mouth daily. 03/13/20   Shary Key, DO  fluconazole (DIFLUCAN) 150 MG tablet Take one tablet now. If symptoms are not improved in 2 days, you may take the second tablet. 12/10/19   Doristine Mango L, DO  Fluticasone-Umeclidin-Vilant 100-62.5-25 MCG/INH AEPB Inhale 100 mcg into the lungs daily. 11/08/19   Patriciaann Clan, DO  glucose blood test strip Use to check blood sugar 4x a day Dx Code: E11.22 11/21/19   Guadalupe Dawn, MD  hydrochlorothiazide (HYDRODIURIL) 25 MG tablet TAKE 1 TABLET BY MOUTH EVERY DAY 06/20/19   Kathrene Alu, MD  levothyroxine (SYNTHROID) 25 MCG tablet Take 1 tablet (25 mcg total) by mouth daily before breakfast. 03/31/20   Arby Barrette, Weldon Picking, DO  metFORMIN (GLUCOPHAGE) 1000 MG tablet Take 1 tablet (1,000 mg total) by mouth 2 (two) times daily with a meal. 03/31/20   Paige, Weldon Picking, DO  metoprolol succinate (TOPROL-XL) 50 MG 24 hr tablet Take 50 mg by mouth daily. Take with or immediately following a meal.    [provider]  metroNIDAZOLE (FLAGYL) 500 MG tablet Take 1 tablet (500 mg total) by mouth 3 (three) times daily. 11/01/19   Guadalupe Dawn, MD  olopatadine (PATANOL) 0.1 % ophthalmic solution Place 1 drop into both eyes 2 (two)  times daily as needed for allergies. 10/05/17   Kathrene Alu, MD  omeprazole (PRILOSEC) 40 MG capsule Take 1 capsule (40 mg total) by mouth 2 (two) times daily. 03/22/20   Armbruster, Carlota Raspberry, MD  potassium chloride (KLOR-CON) 10 MEQ tablet Take 1 tablet (10 mEq total) by mouth daily. 03/31/20   Shary Key, DO  rosuvastatin (CRESTOR) 20 MG tablet Take 1 tablet (20 mg total) by mouth daily. 11/08/19   Patriciaann Clan, DO  sucralfate (CARAFATE) 1 GM/10ML suspension Take 10 mLs (1 g total) by mouth 2 (two) times daily. 03/22/20   Armbruster, Carlota Raspberry, MD  albuterol (VENTOLIN HFA) 108 (90 Base) MCG/ACT inhaler INHALE 2 PUFFS EVERY 4 HOURS AS NEEDED FOR WHEEZING 12/06/19   Guadalupe Dawn, MD    Allergies    Citrus, Fish allergy, Shellfish allergy, Adhesive [tape], Latex, Lipitor [atorvastatin calcium], Lisinopril, Other, and Tramadol  Review of Systems   Review of Systems  Unable to perform ROS: Psychiatric disorder    Physical Exam Updated Vital Signs BP 126/89 (BP Location: Right Arm)   Pulse 92   Temp 97.6 F (36.4 C) (Oral)   Resp 19   Ht 5' 5" (1.651 m)   Wt 64.4 kg   SpO2 100%   BMI 23.63 kg/m   Physical Exam Vitals and nursing note reviewed.  Constitutional:      General: She is not in acute distress.    Appearance: She is well-developed.     Comments: Appears nontoxic  HENT:     Head: Normocephalic and atraumatic.  Eyes:     Extraocular Movements: Extraocular movements intact.     Conjunctiva/sclera: Conjunctivae normal.     Pupils: Pupils are equal, round, and reactive to light.  Cardiovascular:     Rate and Rhythm: Normal rate and regular rhythm.     Pulses: Normal pulses.  Pulmonary:     Effort: Pulmonary effort is normal. No respiratory distress.     Breath sounds: Normal breath sounds. No wheezing.  Abdominal:     General: There is no distension.     Palpations: Abdomen is soft. There is no mass.  Tenderness: There is no abdominal tenderness. There  is no guarding or rebound.  Musculoskeletal:        General: Normal range of motion.     Cervical back: Normal range of motion and neck supple.  Skin:    General: Skin is warm and dry.     Capillary Refill: Capillary refill takes less than 2 seconds.  Neurological:     Mental Status: She is alert and oriented to person, place, and time.  Psychiatric:        Thought Content: Thought content is paranoid and delusional.     Comments: Pt clearly paranoid. Delusional. Irritable but easily redirectable. Denies si or hi to me.      ED Results / Procedures / Treatments   Labs (all labs ordered are listed, but only abnormal results are displayed) Labs Reviewed  COMPREHENSIVE METABOLIC PANEL - Abnormal; Notable for the following components:      Result Value   Sodium 133 (*)    Potassium 2.2 (*)    Chloride 93 (*)    Glucose, Bld 241 (*)    Creatinine, Ser 1.89 (*)    Albumin 3.3 (*)    GFR calc non Af Amer 27 (*)    GFR calc Af Amer 31 (*)    All other components within normal limits  SALICYLATE LEVEL - Abnormal; Notable for the following components:   Salicylate Lvl <4.4 (*)    All other components within normal limits  ACETAMINOPHEN LEVEL - Abnormal; Notable for the following components:   Acetaminophen (Tylenol), Serum <10 (*)    All other components within normal limits  RESPIRATORY PANEL BY RT PCR (FLU A&B, COVID)  ETHANOL  CBC  RAPID URINE DRUG SCREEN, HOSP PERFORMED  URINALYSIS, ROUTINE W REFLEX MICROSCOPIC  BASIC METABOLIC PANEL    EKG None  Radiology No results found.  Procedures Procedures (including critical care time)  Medications Ordered in ED Medications  potassium chloride 10 mEq in 100 mL IVPB (10 mEq Intravenous New Bag/Given 04/03/20 2237)  magnesium sulfate IVPB 2 g 50 mL (2 g Intravenous New Bag/Given 04/03/20 2238)  potassium chloride SA (KLOR-CON) CR tablet 40 mEq (40 mEq Oral Given 04/03/20 2146)    ED Course  I have reviewed the triage vital  signs and the nursing notes.  Pertinent labs & imaging results that were available during my care of the patient were reviewed by me and considered in my medical decision making (see chart for details).    MDM Rules/Calculators/A&P                          Patient presenting for psychiatric evaluation under IVC.  On exam, patient appears nontoxic.  She is clearly paranoid and delusional.  She will need psychiatric evaluation, first look paperwork completed.  Labs obtained from triage show hypokalemia of 2.2.  Patient has a history of this, per daughter she has not been taking her medications.  Will replenish potassium and magnesium in the ED, and recheck.  If potassium rises, consider continued p.o. supplementation and TTS consult.  Potassium remains low, consider medical admission. Discussed plan with pt and daughter.   Pt signed out to [facility] Muthersbaugh, PA-C for f/u on repeat BMP.   Final Clinical Impression(s) / ED Diagnoses Final diagnoses:  Hypokalemia  Paranoia ([place])    Rx / DC Orders ED Discharge Orders    None       Zykira Matlack, PA-C 04/03/20 2310  [name], MD 04/04/20 [phone]

## 2020-04-03 NOTE — ED Notes (Signed)
Pt cane removed from bedside and placed with belonging in locker number 6

## 2020-04-04 DIAGNOSIS — E876 Hypokalemia: Secondary | ICD-10-CM | POA: Diagnosis not present

## 2020-04-04 DIAGNOSIS — F315 Bipolar disorder, current episode depressed, severe, with psychotic features: Secondary | ICD-10-CM | POA: Insufficient documentation

## 2020-04-04 LAB — BASIC METABOLIC PANEL
Anion gap: 12 (ref 5–15)
Anion gap: 12 (ref 5–15)
BUN: 16 mg/dL (ref 8–23)
BUN: 17 mg/dL (ref 8–23)
CO2: 28 mmol/L (ref 22–32)
CO2: 26 mmol/L (ref 22–32)
Calcium: 9.3 mg/dL (ref 8.9–10.3)
Calcium: 9.2 mg/dL (ref 8.9–10.3)
Chloride: 95 mmol/L — ABNORMAL LOW (ref 98–111)
Chloride: 96 mmol/L — ABNORMAL LOW (ref 98–111)
Creatinine, Ser: 1.5 mg/dL — ABNORMAL HIGH (ref 0.44–1.00)
Creatinine, Ser: 1.43 mg/dL — ABNORMAL HIGH (ref 0.44–1.00)
GFR calc Af Amer: 41 mL/min — ABNORMAL LOW (ref >60)
GFR calc Af Amer: 44 mL/min — ABNORMAL LOW (ref >60)
GFR calc non Af Amer: 35 mL/min — ABNORMAL LOW (ref >60)
GFR calc non Af Amer: 38 mL/min — ABNORMAL LOW (ref >60)
Glucose, Bld: 184 mg/dL — ABNORMAL HIGH (ref 70–99)
Glucose, Bld: 170 mg/dL — ABNORMAL HIGH (ref 70–99)
Potassium: 2.5 mmol/L — CL (ref 3.5–5.1)
Potassium: 3.2 mmol/L — ABNORMAL LOW (ref 3.5–5.1)
Sodium: 135 mmol/L (ref 135–145)
Sodium: 134 mmol/L — ABNORMAL LOW (ref 135–145)

## 2020-04-04 LAB — URINALYSIS, ROUTINE W REFLEX MICROSCOPIC
Bacteria, UA: NONE SEEN
Bilirubin Urine: NEGATIVE
Glucose, UA: >=500 mg/dL — AB
Ketones, ur: NEGATIVE mg/dL
Leukocytes,Ua: NEGATIVE
Nitrite: NEGATIVE
Protein, ur: NEGATIVE mg/dL
Specific Gravity, Urine: 1.01 (ref 1.005–1.030)
pH: 5 (ref 5.0–8.0)

## 2020-04-04 MED ORDER — HALOPERIDOL LACTATE 5 MG/ML IJ SOLN
5.0000 mg | Freq: Once | INTRAMUSCULAR | Status: AC
  Administered 2020-04-04: 5 mg via INTRAMUSCULAR
  Filled 2020-04-04: qty 1

## 2020-04-04 MED ORDER — IBUPROFEN 400 MG PO TABS
400.0000 mg | ORAL_TABLET | Freq: Once | ORAL | Status: AC
  Administered 2020-04-04: 400 mg via ORAL
  Filled 2020-04-04: qty 1

## 2020-04-04 MED ORDER — POTASSIUM CHLORIDE CRYS ER 20 MEQ PO TBCR
20.0000 meq | EXTENDED_RELEASE_TABLET | Freq: Two times a day (BID) | ORAL | Status: DC
  Administered 2020-04-04 – 2020-04-08 (×8): 20 meq via ORAL
  Filled 2020-04-04 (×9): qty 1

## 2020-04-04 MED ORDER — LORAZEPAM 1 MG PO TABS
1.0000 mg | ORAL_TABLET | Freq: Once | ORAL | Status: AC
  Administered 2020-04-04: 1 mg via ORAL
  Filled 2020-04-04: qty 1

## 2020-04-04 MED ORDER — POTASSIUM CHLORIDE CRYS ER 20 MEQ PO TBCR
40.0000 meq | EXTENDED_RELEASE_TABLET | Freq: Once | ORAL | Status: AC
  Administered 2020-04-04: 40 meq via ORAL
  Filled 2020-04-04: qty 2

## 2020-04-04 MED ORDER — POTASSIUM CHLORIDE 10 MEQ/100ML IV SOLN
10.0000 meq | INTRAVENOUS | Status: AC
  Administered 2020-04-04 (×3): 10 meq via INTRAVENOUS
  Filled 2020-04-04 (×2): qty 100

## 2020-04-04 NOTE — ED Notes (Signed)
[name] Long, son, [phone] would like an update when available

## 2020-04-04 NOTE — ED Notes (Signed)
Pt on phone with Larey Seat at desk

## 2020-04-04 NOTE — BH Assessment (Addendum)
TTS assessment--1600 nurse is setting up machine and will call TTS when everything is set up  1651 still have not received phone call about TTS assessment--called [facility] and spoke with ED secretary--gave the information that pts nurse went upstairs with another patient and to call back in 15 minutes  [name], LCSW Outpatient Therapist/Triage Specialist

## 2020-04-04 NOTE — ED Notes (Signed)
Pt becoming anxious and restless, attempting to leave, possibly hallucinating as she was asking if her brother was home. PA notified.

## 2020-04-04 NOTE — ED Provider Notes (Signed)
  Care assumed from PA-C [place].  Please see her full H&P.  In short,  [name] Long is a 69 y.o. female presents for psychiatric evaluation.  She is currently under IVC.  Medical clearance labs revealed significant hypokalemia.  Patient has a history of same, takes HCTZ and has not been taking her potassium at home.     Physical Exam  BP 130/77 (BP Location: Right Arm)   Pulse 74   Temp 97.6 F (36.4 C) (Oral)   Resp 18   Ht 5\' 5"  (1.651 m)   Wt 64.4 kg   SpO2 100%   BMI 23.63 kg/m   Physical Exam Vitals and nursing note reviewed.  Constitutional:      General: She is not in acute distress.    Appearance: She is well-developed.     Comments: Talking loudly  HENT:     Head: Normocephalic.  Eyes:     General: No scleral icterus.    Conjunctiva/sclera: Conjunctivae normal.  Cardiovascular:     Rate and Rhythm: Normal rate.  Pulmonary:     Effort: Pulmonary effort is normal.  Musculoskeletal:        General: Normal range of motion.     Cervical back: Normal range of motion.  Skin:    General: Skin is warm and dry.  Neurological:     Mental Status: She is alert.     ED Course/Procedures   Clinical Course as of Apr 04 525  Fri Apr 03, 2020  2330 Plan: Patient with significant hypokalemia.  Patient receiving magnesium, oral and IV potassium.  Will repeat BMP.  If significant improvement, patient may be medically cleared for psychiatric treatment however if no significant improvement, patient will need admission.   [HM]  Sat Apr 04, 2020  0526 Approx baseline  Creatinine(!): 1.89 [HM]  0526 Initial - oral and IV K+ replacement given  Potassium(!!): 2.2 [HM]  0526 Initially tachycardic, but this has resolved  Pulse Rate(!): 102 [HM]    Clinical Course User Index [HM] Muthersbaugh, Jarrett Soho, PA-C    Procedures  MDM  Patient presents for psychiatric evaluation under IVC.  Found to be significantly hypokalemic.  Replacement initiated here in the emergency  department.  5:27 AM Patient has completed oral and IV potassium.  Will recheck BMP.  She remains alert.  Continues to talk loudly to herself in the hallway.  6:46 AM BMP with potassium of 2.5.  Will need admission. Pt follows with family practice.     Hypokalemia  [facility]ano[facility])      Muthersbaugh, Gwenlyn Perking 04/04/20 [id number]    [name], MD 04/04/20 [phone]

## 2020-04-04 NOTE — ED Provider Notes (Signed)
  Physical Exam  BP 115/85 (BP Location: Right Arm)   Pulse 82   Temp 97.6 F (36.4 C) (Oral)   Resp 20   Ht 5\' 5"  (1.651 m)   Wt 64.4 kg   SpO2 99%   BMI 23.63 kg/m   Physical Exam  ED Course/Procedures   Clinical Course as of Apr 05 1455  Fri Apr 03, 2020  2330 Plan: Patient with significant hypokalemia.  Patient receiving magnesium, oral and IV potassium.  Will repeat BMP.  If significant improvement, patient may be medically cleared for psychiatric treatment however if no significant improvement, patient will need admission.   [HM]  Sat Apr 04, 2020  0526 Approx baseline  Creatinine(!): 1.89 [HM]  0526 Initial - oral and IV K+ replacement given  Potassium(!!): 2.2 [HM]  0526 Initially tachycardic, but this has resolved  Pulse Rate(!): 102 [HM]    Clinical Course User Index [HM] Muthersbaugh, Jarrett Soho, PA-C    Procedures  MDM   7:57 AM Plan was to admit patient to family medicine service for her persistent hypokalemia.  I spoke to family medicine residents over the phone who asked that we give patient several more rounds of IV potassium as well as p.o. potassium and recheck.  I have ordered more potassium, however if she continues to be hypokalemic to less than 3 she will require admission after that point.  9:14 AM Patient refusing potassium. She remains psychotic. Will order IM Haldol.  10:16 AM IM Haldol given but patient continues to refuse IV potassium.  11:57 AM Patient resting comfortably, receiving IV potassium.  2:55 PM  BMP shows K of 3.2. Patient is medically cleared for TTS disposition, we have ordered PO potassium for the next few days.   Portions of this note were generated with [employer]obby[employer]. Dictation errors may occur despite best attempts at proofreading.    Delia Heady, PA-C 04/04/20 1456    Lajean Saver, MD 04/04/20 [phone]

## 2020-04-04 NOTE — Progress Notes (Signed)
Daughter at bedside.

## 2020-04-04 NOTE — ED Notes (Signed)
Breakfast Ordered 

## 2020-04-04 NOTE — ED Notes (Signed)
[name] Long, guardian, [phone] would like an update when available

## 2020-04-04 NOTE — BH Assessment (Signed)
Comprehensive Clinical Assessment (CCA) Note  04/04/2020 [name] Long [id number]  [name] Long is a 69 yo female patient transported to [facility] via [employer] due to IVC. Pts daughter states that pt was "talking out of her head, suicidal, and threw out all her medicines".    Pt has a history of bipolar disorder, schizoaffective disorder, and dementia. Pt was poor historian and continued to nod off to sleep repeatedly throughout assessment. Pt was very apologetic and polite when she was awakened to continue assessment.   Pts daughter [name] Long) reports that her mother stopped taking risperidone 3 years ago, and has had two delusional episodes 1--after coming off medication pt thought she was recovering from a c-section and had a newborn and 2--most recent episode where pt is having conversations with deceased father and husband. Pt recently threw out all of her medications because "they were not right". Daughters concerned about her safety.  Pts daughter [name] Long is legal guardian.    Both daughters report that they would like to see their mother stabilized and feeling more like herself.  Pt denies current SI, HI, and AVH (which conflicts with daughter's information). Pt denies any substance use. Pt denies that she is a danger to herself or others; daughters fearful of harm to self.  Patient's Currently Reported Symptoms/Problems: delusions; AVH  [name], LCSW Outpatient Therapist/Triage Specialist   Disposition: Per Sheran Fava NP pt meets criteria for inpatient treatment-geropsych   Visit Diagnosis:    Bipolar disorder    ICD-10-CM   1. Hypokalemia  E87.6   2. Paranoia ([place])  F22     *Bipolar Disorder, most recent episode depressed, with psychotic features  F31.5  CCA Screening, Triage and Referral (STR)  Patient Reported Information How did you hear about [place]? Legal System  Referral name: No data recorded Referral phone number: No data recorded  Whom do you see for routine  medical problems? Primary Care  Practice/Facility Name: No data recorded Practice/Facility Phone Number: No data recorded Name of Contact: No data recorded Contact Number: No data recorded Contact Fax Number: No data recorded Prescriber Name: No data recorded Prescriber Address (if known): No data recorded  What Is the Reason for Your Visit/Call Today? No data recorded How Long Has This Been Causing You Problems? <Week  What Do You Feel Would Help You the Most Today? Assessment Only;Medication   Have You Recently Been in Any Inpatient Treatment (Hospital/Detox/Crisis Center/28-Day Program)? No  Name/Location of Program/Hospital:No data recorded How Long Were You There? No data recorded When Were You Discharged? No data recorded  Have You Ever Received Services From [facility] Before? Yes  Who Do You See at [facility]? No data recorded  Have You Recently Had Any Thoughts About Hurting Yourself? No  Are You Planning to Commit Suicide/Harm Yourself At This time? No   Have you Recently Had Thoughts About [place]? No  Explanation: No data recorded  Have You Used Any Alcohol or Drugs in the Past 24 Hours? No  How Long Ago Did You Use Drugs or Alcohol? No data recorded What Did You Use and How Much? No data recorded  Do You Currently Have a Therapist/Psychiatrist? No (pt reports that she used to in the past. PCP managing meds currently)  Name of Therapist/Psychiatrist: No data recorded  Have You Been Recently Discharged From Any Office Practice or Programs? No  Explanation of Discharge From Practice/Program: No data recorded    CCA Screening Triage Referral Assessment Type of Contact: Tele-Assessment  Is this Initial or Reassessment? Initial Assessment  Date Telepsych consult ordered in [facility]:  04/04/20  Time Telepsych consult ordered in [facility]:  [place]   Patient Reported Information Reviewed? Yes  Patient Left Without Being Seen? No data recorded Reason  for Not Completing Assessment: No data recorded  Collateral Involvement: pts daughter Drucilla Cumber gave lots of beneficial information. Pt was poor historian and kept falling asleep throughout assessment.   Does Patient Have a Stage manager Guardian? No data recorded Name and Contact of Legal Guardian: No data recorded If Minor and Not Living with Parent(s), Who has Custody? No data recorded Is CPS involved or ever been involved? Never  Is APS involved or ever been involved? Never   Patient Determined To Be At Risk for Harm To Self or Others Based on Review of Patient Reported Information or Presenting Complaint? Yes, for Self-Harm  Method: No data recorded Availability of Means: No data recorded Intent: No data recorded Notification Required: No data recorded Additional Information for Danger to Others Potential: No data recorded Additional Comments for Danger to Others Potential: No data recorded Are There Guns or Other Weapons in Your Home? No data recorded Types of Guns/Weapons: No data recorded Are These Weapons Safely Secured?                            No data recorded Who Could Verify You Are Able To Have These Secured: No data recorded Do You Have any Outstanding Charges, Pending Court Dates, Parole/Probation? No data recorded Contacted To Inform of Risk of Harm To Self or Others: No data recorded  Location of Assessment: [facility] ED   Does Patient Present under Involuntary Commitment? Yes  IVC Papers Initial File Date: 04/03/20   [place] of Residence: [place]   Patient Currently Receiving the Following Services: Medication Management   Determination of Need: No data recorded  Options For Referral: Geropsychiatric Facility    CCA Biopsychosocial  Intake/Chief Complaint:  CCA Intake With Chief Complaint CCA Part Two Date: 04/04/20 CCA Part Two Time: 1830 Chief Complaint/Presenting Problem: Novice is a 69 yo female patient transported to [facility] via [facility]  due to IVC. Pts daughter states that pt was "talking out of her head, suicidal, and threw out all her medicines".  Pt has a history of bipolar disorder, schizoaffective disorder, and dementia. Pt was poor historian and continued to nod off repeatedly throughout assessment. Pt was very apologetic and polite when she was awakened to continue assessment. Pts daughter [name] Long) reports that her mother stopped taking risperidone 3 years ago, and has had two delusional episodes 1--after coming off medication pt thought she was recovering from a c-section and had a newborn and 2--most recent episode where pt is having conversations with deceased father and husband. Pt recently threw out all of her medications because "they were not right". Daughters concerned about her safety.  Pts daughter [name] Long is legal guardian.  Both daughters report that they would like to see their mother stabilized and feeling more like herself.  Pt denies current SI, HI, and AVH (which conflicts with daughter's information). Pt denies any substance use. Pt denies that she is a danger to herself or others; daughters fearful of harm to self. Patient's Currently Reported Symptoms/Problems: delusions; AVH Individual's Strengths: family support Type of Services Patient Feels Are Needed: inpatient treatment  Mental Health Symptoms Depression:  Depression: Fatigue  Mania:  Mania: Racing thoughts  Anxiety:  Psychosis:  Psychosis: Delusions, Hallucinations (talking to deceased father and deceased husband)  Trauma:  Trauma: None  Obsessions:  Obsessions: None  Compulsions:  Compulsions: None  Inattention:  Inattention: None  Hyperactivity/Impulsivity:     Oppositional/Defiant Behaviors:  Oppositional/Defiant Behaviors: None  Emotional Irregularity:  Emotional Irregularity: None  Other Mood/Personality Symptoms:      Mental Status Exam Appearance and self-care  Stature:  Stature: Average  Weight:  Weight: Average weight   Clothing:  Clothing: Neat/clean  Grooming:  Grooming: Normal  Cosmetic use:  Cosmetic Use: None  Posture/gait:  Posture/Gait: Normal  Motor activity:  Motor Activity: Slowed  Sensorium  Attention:  Attention: Confused, Distractible  Concentration:  Concentration: Scattered (very sleepy)  Orientation:  Orientation: Person, Place  Recall/memory:  Recall/Memory: Defective in Remote, Defective in Recent, Defective in Short-term, Defective in Immediate  Affect and Mood  Affect:  Affect:  (uta)  Mood:  Mood:  (uta)  Relating  Eye contact:  Eye Contact: Fleeting  Facial expression:  Facial Expression: Depressed  Attitude toward examiner:  Attitude Toward Examiner: Cooperative  Thought and Language  Speech flow: Speech Flow: Soft, Pressured, Garbled  Thought content:  Thought Content: Delusions  Preoccupation:  Preoccupations: None  Hallucinations:  Hallucinations: Auditory  Organization:     Transport planner of Knowledge:  Fund of Knowledge:  Special educational needs teacher)  Intelligence:  Intelligence: Average  Abstraction:  Abstraction:  Special educational needs teacher)  Judgement:  Judgement: Dangerous  Reality Testing:  Reality Testing: Distorted  Insight:  Insight: Gaps  Decision Making:  Decision Making: Impulsive  Social Functioning  Social Maturity:  Social Maturity: Impulsive  Social Judgement:  Social Judgement:  Special educational needs teacher)  Stress  Stressors:  Stressors: Grief/losses  Coping Ability:  Coping Ability: English as a second language teacher Deficits:  Skill Deficits: Self-control  Supports:  Supports: Family    Exercise/Diet: Exercise/Diet Do You Have Any Trouble Sleeping?: Yes Explanation of Sleeping Difficulties: pt denies but daughter confirms that she is up at 3am and calling people on the phones from 3am to 6am   CCA Employment/Education  Employment/Work Situation: Employment / Work Situation Employment situation: On disability Patient's job has been impacted by current illness: No What is the longest time patient has a  held a job?: Pt was unsure Where was the patient employed at that time?: Psychologist, counselling Has patient ever been in the [employer]?: No   CCA Family/Childhood History  Family and Relationship History: Family history Are you sexually active?: No What is your sexual orientation?: Pt did not answer Has your sexual activity been affected by drugs, alcohol, medication, or emotional stress?: n/a Does patient have children?: Yes  Childhood History:  Childhood History By whom was/is the patient raised?: Both parents Additional childhood history information: n/a Description of patient's relationship with caregiver when they were a child: Raised mostly by her father.  How were you disciplined when you got in trouble as a child/adolescent?: n/a Did patient suffer any verbal/emotional/physical/sexual abuse as a child?: No Has patient ever been sexually abused/assaulted/raped as an adolescent or adult?: No Witnessed domestic violence?: No Has patient been affected by domestic violence as an adult?: No   CCA Substance Use  Alcohol/Drug Use: Alcohol / Drug Use History of alcohol / drug use?: No history of alcohol / drug abuse (pt denies)     ASAM's:  Six Dimensions of Multidimensional Assessment  Dimension 1:  Acute Intoxication and/or Withdrawal Potential:   Dimension 1:  Description of individual's past and current experiences of substance use and withdrawal:  pt denies  Dimension 2:  Biomedical Conditions and Complications:      Dimension 3:  Emotional, Behavioral, or Cognitive Conditions and Complications:     Dimension 4:  Readiness to Change:     Dimension 5:  Relapse, Continued use, or Continued Problem Potential:     Dimension 6:  Recovery/Living Environment:     ASAM Severity Score: ASAM's Severity Rating Score: 0  ASAM Recommended Level of Treatment:     Substance use Disorder (SUD)    Recommendations for Services/Supports/Treatments:    Disposition: Per Sheran Fava  NP pt meets criteria for inpatient treatment-geropsych   DSM5 Diagnoses: Patient Active Problem List   Diagnosis Date Noted  . Bipolar I disorder, most recent episode depressed, severe w psychosis ([place])   . Dementia associated with other underlying disease with behavioral disturbance ([place]) 03/27/2020  . Abscessed tooth 02/09/2020  . GERD (gastroesophageal reflux disease) 12/27/2019  . Post-menopausal atrophic vaginitis 12/13/2019  . Vagina, candidiasis 12/10/2019  . Vaginal itching 11/10/2019  . Dysphagia 07/02/2019  . Screening examination for STD (sexually transmitted disease) 03/25/2019  . 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 Other chronic pain 12/03/2018  . Dizziness of unknown cause 08/04/2018  . Tremor 07/15/2018  . Atrial flutter with rapid ventricular response ([place]) 07/10/2017  . Atrial fibrillation ([place]) 07/05/2017  . COPD (chronic obstructive pulmonary disease) ([place]) 01/22/2017  . Pedal edema   . Acute on chronic congestive heart failure ([place])   . Chronic bilateral low back pain without sciatica 08/15/2016  . 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 Schizoaffective disorder ([place])   . Hypokalemia 01/22/2015  . Osteoarthritis, multiple sites 06/08/2011  . Dysuria 02/14/2011  . CHRONIC KIDNEY DISEASE STAGE II (MILD) 09/14/2009  . Hypothyroidism 08/31/2006  . Type 2 diabetes mellitus ([place]) 08/31/2006  . HYPERCHOLESTEROLEMIA 08/31/2006  . Tobacco abuse 08/31/2006  . HYPERTENSION, BENIGN SYSTEMIC 08/31/2006    Referrals to Alternative Service(s):  Disposition: Per Sheran Fava NP pt meets criteria for inpatient treatment-geropsych  [name], LCSW Outpatient Therapist/Triage Specialist

## 2020-04-04 NOTE — ED Provider Notes (Signed)
4:18 PM Pt holding, under IVC, awaiting TTS evaluation. RN states that patient is becoming more agitated. Asking for medication for agitation. She received 5mg  IM haldol this morning. I have ordered 1mg  PO ativan.   BP 115/85 (BP Location: Right Arm)   Pulse 82   Temp 97.6 F (36.4 C) (Oral)   Resp 20   Ht 5\' 5"  (1.651 m)   Wt 64.4 kg   SpO2 99%   BMI 23.63 kg/m     Carlisle Cater, PA-C 04/04/20 [place], Ankit, MD 04/15/20 1523

## 2020-04-05 DIAGNOSIS — E876 Hypokalemia: Secondary | ICD-10-CM | POA: Diagnosis not present

## 2020-04-05 LAB — BASIC METABOLIC PANEL
Anion gap: 12 (ref 5–15)
BUN: 15 mg/dL (ref 8–23)
CO2: 28 mmol/L (ref 22–32)
Calcium: 9.5 mg/dL (ref 8.9–10.3)
Chloride: 94 mmol/L — ABNORMAL LOW (ref 98–111)
Creatinine, Ser: 1.49 mg/dL — ABNORMAL HIGH (ref 0.44–1.00)
GFR calc Af Amer: 41 mL/min — ABNORMAL LOW (ref >60)
GFR calc non Af Amer: 36 mL/min — ABNORMAL LOW (ref >60)
Glucose, Bld: 135 mg/dL — ABNORMAL HIGH (ref 70–99)
Potassium: 3.3 mmol/L — ABNORMAL LOW (ref 3.5–5.1)
Sodium: 134 mmol/L — ABNORMAL LOW (ref 135–145)

## 2020-04-05 LAB — CBG MONITORING, ED
Glucose-Capillary: 132 mg/dL — ABNORMAL HIGH (ref 70–99)
Glucose-Capillary: 252 mg/dL — ABNORMAL HIGH (ref 70–99)

## 2020-04-05 MED ORDER — LORAZEPAM 1 MG PO TABS: 1.0000 mg | ORAL_TABLET | Freq: Once | ORAL | Status: DC

## 2020-04-05 MED ORDER — IBUPROFEN 400 MG PO TABS
400.0000 mg | ORAL_TABLET | Freq: Once | ORAL | Status: AC
  Administered 2020-04-06: 400 mg via ORAL
  Filled 2020-04-05: qty 1

## 2020-04-05 MED ORDER — ACETAMINOPHEN 500 MG PO TABS
1000.0000 mg | ORAL_TABLET | Freq: Once | ORAL | Status: AC
  Administered 2020-04-05: 1000 mg via ORAL
  Filled 2020-04-05: qty 2

## 2020-04-05 MED ORDER — ACETAMINOPHEN 500 MG PO TABS
1000.0000 mg | ORAL_TABLET | Freq: Once | ORAL | Status: AC
  Administered 2020-04-06: 1000 mg via ORAL
  Filled 2020-04-05: qty 2

## 2020-04-05 NOTE — ED Notes (Signed)
Patients daughter [name] would like for the patient to call her

## 2020-04-05 NOTE — ED Notes (Signed)
Breakfast Ordered 

## 2020-04-05 NOTE — BH Assessment (Signed)
Patient meets inpatient criteria. Patient referred to the following facilities for consideration of bed placement:  [facility]  [facility]  [facility] Details  [facility] Details  [facility]  [facility]  [place]

## 2020-04-05 NOTE — BH Assessment (Addendum)
Contacted guardian, pts daughter Laurell Josephs [phone]. She had questions regarding [facility]. States that she doesn't want her mother going to [facility] because she read bad reviews about the facility. States that her sister knows someone that went to the facility and reported a bad experience.   The guardian has asked for her to stay local-[place]. Clinician informed guardian that [facility] is the only facility in [place] and does not provide inpatient [place] facilities. The guardian asked if other facilities could be considered. Clinician explained that she has been referred to at least #22 appropriate facilities able to meet her needs. The daughter asked this clinician to re-check and see if [place] and/or [facility] had any availability. Clinician agreed.   [place] @ [phone]. Unable to reach anyone by phone. Clinician will continue following up. Contacted [place] again at 1330 and spoke to [place]. She states that patient is declined due to suspected Dementia. This would be exclusionary criteria for their facility. [facility]. Spoke to [place]. States that their [facility] unit is closed until further notice.   Shared with patient's guardian the updates regarding bed availability at [facility] and [place]. Family continues to indicate that they do no want their mother to go to [facility]. The report hearing from a family friend that it's not a good facility. States, "I heard they have dirty bed sheets". Daughter stating that they feel back in a corner and no other options.  Clinician listened to concerns. However, shared with family that bed placement is limited. Discussed with family that based on patient's presentation and recommendations by psychiatry inpatient treatment remains the recommendation. The daughter stated that she would have to have a family meeting and call this clinician back.

## 2020-04-05 NOTE — BH Assessment (Addendum)
Per Clearnce Sorrel, patient accepted to [facility] (main campus) for admission 04/06/2020. Patient must have COVID negative test results before transporting to [facility].  She may transport anytime after 5am. The accepting provider is Dr. Jonelle Sports. Nurse report [phone]. Mali G, RN and Trisha Mangle, Therapist, sports.

## 2020-04-05 NOTE — ED Notes (Signed)
Provided update with family. Family requesting that pt go to either [facility] or that pt goes through outpatient program while receiving family support at home. Family's concern is that patient will not do well if she goes to an inpatient facility that is far away. Pt does seem to have a strong support network at home and family is agreeable to providing the patient with the appropriate resources. Writer attempted to reach [facility] counselors to discuss family's wishes, however, unable to get into contact with anyone.

## 2020-04-05 NOTE — BHH Counselor (Signed)
Pt's daughter/legal guardian Carrington Mullenax [phone]) called TTS.  She was advised earlier that Pt was accepted to [facility].  Ms. Rebel stated that she does not want Pt to go there in the AM because of distance and restrictions.  She said she is open to Pt being placed at [facility] now.    Ms. Keeler requested an update on placement and also for a phone call from social work in the AM.

## 2020-04-05 NOTE — ED Notes (Signed)
Lunch Tray Ordered @ 1715. 

## 2020-04-05 NOTE — BH Assessment (Addendum)
Daughter/guardian called back stating she has done her own research and made calls.   States that she has called [facility] and was told "no beds". Clinician explained that [facility] does have patient's referral packed but if no beds she wouldn't be considered for admission today.   The daughter/guardian says she called [facility] and was told that their intake coordinator has left for the day. She states, "They told me to call back tomorrow".   The daughter also called [facility] and says that she was told they are at capacity. Clinician re-iterated that the [facility] is closed per their staff. This information was confirmed today.   Also, that patient was declined at [facility] due to Dementia Diagnosis. Therefore, [facility] is not an option for placement.   The daughter was made aware that patient is still accepted to [facility] for admission tomorrow. However, referrals were sent out to multiple places but no acceptances or facilities are at capacity.

## 2020-04-06 DIAGNOSIS — E876 Hypokalemia: Secondary | ICD-10-CM | POA: Diagnosis not present

## 2020-04-06 MED ORDER — LORAZEPAM 1 MG PO TABS
1.0000 mg | ORAL_TABLET | Freq: Four times a day (QID) | ORAL | Status: DC | PRN
  Administered 2020-04-07 – 2020-04-08 (×3): 1 mg via ORAL
  Filled 2020-04-06 (×3): qty 1

## 2020-04-06 MED ORDER — BENZTROPINE MESYLATE 1 MG PO TABS: 1.0000 mg | ORAL_TABLET | Freq: Two times a day (BID) | ORAL | Status: DC | PRN

## 2020-04-06 MED ORDER — BENZTROPINE MESYLATE 1 MG/ML IJ SOLN
1.0000 mg | Freq: Two times a day (BID) | INTRAMUSCULAR | Status: DC | PRN
  Filled 2020-04-06: qty 1

## 2020-04-06 MED ORDER — ACETAMINOPHEN 325 MG PO TABS
650.0000 mg | ORAL_TABLET | Freq: Once | ORAL | Status: AC
  Administered 2020-04-06: 650 mg via ORAL
  Filled 2020-04-06: qty 2

## 2020-04-06 MED ORDER — HALOPERIDOL LACTATE 5 MG/ML IJ SOLN: 5.0000 mg | Freq: Two times a day (BID) | INTRAMUSCULAR | Status: DC

## 2020-04-06 MED ORDER — LORAZEPAM 2 MG/ML IJ SOLN: 1.0000 mg | Freq: Four times a day (QID) | INTRAMUSCULAR | Status: DC | PRN

## 2020-04-06 MED ORDER — LORAZEPAM 1 MG PO TABS
1.0000 mg | ORAL_TABLET | Freq: Once | ORAL | Status: DC
  Filled 2020-04-06: qty 1

## 2020-04-06 MED ORDER — HALOPERIDOL 5 MG PO TABS
5.0000 mg | ORAL_TABLET | Freq: Two times a day (BID) | ORAL | Status: DC
  Administered 2020-04-06 – 2020-04-08 (×6): 5 mg via ORAL
  Filled 2020-04-06 (×6): qty 1
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
## 2020-04-06 NOTE — ED Notes (Signed)
Pt informed she has already had 2 calls.

## 2020-04-06 NOTE — ED Notes (Signed)
Per dtr she has been taking respirdone for 40 plus years.

## 2020-04-06 NOTE — ED Notes (Signed)
Pt making first phone call of the day to son

## 2020-04-06 NOTE — Progress Notes (Addendum)
CSW contacted admissions at [facility]. They denied that pt has been accepted to their facility. CSW spoke with pt's daughter [name]. She is adamant that her mother not be transported to [place] will call other geriatric psychiatric hospitals this morning to check for bed availability.   Disposition will continue to follow.      Audree Camel, MSW, LCSW, LCAS Clinical Social Worker II Disposition CSW [phone]   UPDATE 1124: Pt re-faxed to the following facilities:  [facility] (at capacity) [facility] (at capacity today) [facility] (left message) [place]

## 2020-04-06 NOTE — ED Notes (Signed)
Pt continues to ask to leave to go see Dr. Sharol Given to get knee injections
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
## 2020-04-06 NOTE — ED Notes (Signed)
Pt making second phone call of the day

## 2020-04-06 NOTE — Progress Notes (Signed)
Informed by nursing staff that patient has been aggressive and remains psychotic. Reviewed patient's chart and history. She has been recommended for gero-psych treatment. Discussed medication recommendations with Dr. Dwyane Dee. Start haldol im/po bid for psychosis. Start cogentin 1 mg bid prn po/im for EPS and ativan 1 mg as needed for agitation.

## 2020-04-06 NOTE — Progress Notes (Signed)
Inpatient Diabetes Program Recommendations  AACE/ADA: New Consensus Statement on Inpatient Glycemic Control (2015)  Target Ranges:  Prepandial:   less than 140 mg/dL      Peak postprandial:   less than 180 mg/dL (1-2 hours)      Critically ill patients:  140 - 180 mg/dL   Results for [name] Long, [name] Long (MRN [id number]) as of 04/06/2020 11:07  Ref. Range 04/05/2020 13:55 04/05/2020 23:54  Glucose-Capillary Latest Ref Range: 70 - 99 mg/dL 132 (H) 252 (H)    To ED: Pt bib gpd under IVC by pts family who reports SI and hallucinations  History: DM, CKD, CHF, COPD  Home DM Meds: Metformin 1000 mg BID       Farxiga 10 mg Daily       Januvia 25 mg Daily  Current Orders: Metformin 1000 mg BID     MD- Note patient had elevated CBG check at 11pm last night (252 mg/dl)  Please consider adding order for CBG checks TID AC + Hs  Please also consider adding Novolog Sensitive Correction Scale/ SSI (0-9 units) TID AC + HS while patient awaiting placement     --Will follow patient during hospitalization--  Wyn Quaker RN, MSN, CDE Diabetes Coordinator Inpatient Glycemic Control Team Team Pager: [phone] (8a-5p)

## 2020-04-07 DIAGNOSIS — E876 Hypokalemia: Secondary | ICD-10-CM | POA: Diagnosis not present

## 2020-04-07 NOTE — ED Notes (Signed)
Brought pt drink

## 2020-04-07 NOTE — Progress Notes (Signed)
CSW received a call from Christy Sartorius in Admissions at [facility] inquiring when patient would be transported.  Stated they have been holding the bed for 2 days waiting on patients arrival.  Informed Christy Sartorius patient would not be coming to [facility] per request of her daughter.  [place] Disposition, CSW [phone] (cell)

## 2020-04-07 NOTE — Progress Notes (Signed)
[facility], called to inquire about patient.  She stated they are reviewing the referral and will inform CSW of any decision that is made.  [place] Disposition, CSW [phone] (cell)

## 2020-04-07 NOTE — ED Notes (Signed)
Pt daughter updated on pt POC per pt request.

## 2020-04-07 NOTE — ED Notes (Signed)
New scrubs provided, sitter to assist pt with shower.

## 2020-04-07 NOTE — ED Provider Notes (Signed)
Emergency Medicine Observation Re-evaluation Note  [name] Long is a 69 y.o. female, seen on rounds today.  Pt initially presented to the ED for complaints of No chief complaint on file. Currently, the patient is sitting in a chair, eating, conversing with tech at bedside. Complains of needing nerve pills because there is an ex-police chief in [place] going voodoo on her. No pain at this time.  Physical Exam  BP (!) 155/86   Pulse 97   Temp 98.4 F (36.9 C) (Oral)   Resp 18   Ht 5\' 5"  (1.651 m)   Wt 64.4 kg   SpO2 97%   BMI 23.63 kg/m  Physical Exam General: alert Lungs: Respirations even and unlabored Psych: As above  ED Course / MDM  EKG:EKG Interpretation  Date/Time:  Friday April 03 2020 21:53:22 EDT Ventricular Rate:  80 PR Interval:  208 QRS Duration: 80 QT Interval:  388 QTC Calculation: 447 R Axis:   67 Text Interpretation: Sinus rhythm with Premature atrial complexes Confirmed by Lajean Saver [phone]) on 04/04/2020 10:31:22 AM  Clinical Course as of Apr 08 2123  Fri Apr 03, 2020  2330 Plan: Patient with significant hypokalemia.  Patient receiving magnesium, oral and IV potassium.  Will repeat BMP.  If significant improvement, patient may be medically cleared for psychiatric treatment however if no significant improvement, patient will need admission.   [HM]  Sat Apr 04, 2020  0526 Approx baseline  Creatinine(!): 1.89 [HM]  0526 Initial - oral and IV K+ replacement given  Potassium(!!): 2.2 [HM]  0526 Initially tachycardic, but this has resolved  Pulse Rate(!): 102 [HM]    Clinical Course User Index [HM] Muthersbaugh, Jarrett Soho, PA-C   I have reviewed the labs performed to date as well as medications administered while in observation.  Recent changes in the last 24 hours include awaiting placement.  Occasionally aggressive, not at this time.  Plan  Current plan is for placement.  Patient apparently had a bed at [facility] however patient's daughter  declined this placement and requests [facility], [facility].  Social work continues to work on placement.  Patient is under IVC, questions whether or not family is able to refuse a placement option at this time.  This should be addressed with social work/[facility] leadership in the morning. Patient is under full IVC at this time.   Tacy Learn, PA-C 04/07/20 2126    Valarie Merino, MD 04/10/20 [phone]

## 2020-04-08 DIAGNOSIS — E876 Hypokalemia: Secondary | ICD-10-CM | POA: Diagnosis not present

## 2020-04-08 LAB — RESPIRATORY PANEL BY RT PCR (FLU A&B, COVID)
Influenza A by PCR: NEGATIVE
Influenza B by PCR: NEGATIVE
SARS Coronavirus 2 by RT PCR: NEGATIVE

## 2020-04-08 MED ORDER — ACETAMINOPHEN 500 MG PO TABS
1000.0000 mg | ORAL_TABLET | Freq: Once | ORAL | Status: AC
  Administered 2020-04-08: 1000 mg via ORAL
  Filled 2020-04-08: qty 2

## 2020-04-08 NOTE — ED Notes (Signed)
Pt uphappy still with the whole system after the events of the day.  Fighting the sitter earlier.  Will attempt the covid swab again later

## 2020-04-08 NOTE — ED Notes (Signed)
Pt c/o not being able to leave her room  This is from actions earlier in the day  wlll need to re-evaluate the pt

## 2020-04-08 NOTE — ED Provider Notes (Signed)
Emergency Medicine Observation Re-evaluation Note  [name] Long is a 69 y.o. female, seen on rounds today.  Pt initially presented to the ED for complaints of No chief complaint on file. Currently, the patient is awake and alert, interactive, speaking clearly.  Physical Exam  BP 129/68   Pulse 79   Temp 97.7 F (36.5 C) (Oral)   Resp 18   Ht 5\' 5"  (1.651 m)   Wt 64.4 kg   SpO2 97%   BMI 23.63 kg/m  Physical Exam General: In no distress, awake, alert, breathing easily  Lungs: No increased work of breathing, speaking clearly Psych: Delusional  ED Course / MDM  EKG:EKG Interpretation  Date/Time:  Friday April 03 2020 21:53:22 EDT Ventricular Rate:  80 PR Interval:  208 QRS Duration: 80 QT Interval:  388 QTC Calculation: 447 R Axis:   67 Text Interpretation: Sinus rhythm with Premature atrial complexes Confirmed by Lajean Saver [phone]) on 04/04/2020 10:31:22 AM  Clinical Course as of Apr 08 1050  Fri Apr 03, 2020  2330 Plan: Patient with significant hypokalemia.  Patient receiving magnesium, oral and IV potassium.  Will repeat BMP.  If significant improvement, patient may be medically cleared for psychiatric treatment however if no significant improvement, patient will need admission.   [HM]  Sat Apr 04, 2020  0526 Approx baseline  Creatinine(!): 1.89 [HM]  0526 Initial - oral and IV K+ replacement given  Potassium(!!): 2.2 [HM]  0526 Initially tachycardic, but this has resolved  Pulse Rate(!): 102 [HM]    Clinical Course User Index [HM] Muthersbaugh, Jarrett Soho, PA-C   I have reviewed the labs performed to date as well as medications administered while in observation.  Recent changes in the last 24 hours include none remarkable.  Plan  Current plan is for behavioral health placement.  She has been accepted at one facility, but family seemingly declined, preferring a more local placement. Patient is under full IVC at this time.   [name], MD 04/08/20  1053

## 2020-04-08 NOTE — Progress Notes (Signed)
Patient has been excepted to [facility] pending a recent COVID test.  Test has to be within 72 hours of being transported to the facility.  Spoke with [facility] ED Nurse to request test and have results faxed to [phone].  Pamala Hurry, [place], stated that if the results didn't come back this evening that they would hold the bed for the patient tomorrow as well.  She provided no information whether they would hold the bed past 10/7 after 11pm.  Pt accepted to [facility], [address] [place], [place] [address], [address], [address] 2  Dr. Audie Pinto is the attending provider.    Call report to [phone]   [facility] ED notified.     Pt is IVC.    Pt may be transported by  [facility]   Pt scheduled  to arrive at [facility] between the hours of 6am-11pm (pending negative COVID test)   [place] Disposition, CSW [phone] (cell)

## 2020-04-08 NOTE — ED Notes (Signed)
The pt needs another  covid test in order to  Go out tonight or tomorrow

## 2020-04-08 NOTE — Progress Notes (Addendum)
CSW spoke with Stanton Kidney at [facility]. Pt is still under review at this facility. She will call CSW back shortly.   Pamala Hurry at [facility] reports pt is also under review at their facility. She states that they are having issues with their e-fax machine and ask to fax any requested documents to [place], MSW, [place], [place] Worker II Disposition CSW [phone]

## 2020-04-08 NOTE — Progress Notes (Signed)
Patients COVID test came back negative.  Faxed the information to the accepting facility. [phone].  Spoke with ED nurse to determine if transportation would be arranged tonight and that the patient would arrive before 11pm.  Since patient is IVC and would need police transportation it was recommended that patient be transferred in the morning as not to arrive after 11pm tonight and have to return back to [facility] ED.  Spoke with patients daughter, [name], and informed her of patient being moved to [facility] tomorrow.  She stated that the nurse requested her to bring the patient some items this evening but was unable too.  I suggested she call the ED in the morning to see what time patient would be transferred and possible bring the items to the ED beforehand.  [place] Disposition, CSW [phone] (cell)

## 2020-04-08 NOTE — BH Assessment (Addendum)
Re-assessment 04/08/2020:   [name] Long is a 69 yo female with bipolar disorder, schizoaffective disorder, and dementia. Patient transported to [facility] via [employer] due to IVC. Upon chart review: "Pts daughter states that pt was "talking out of her head, suicidal, and threw out all her medicines".  Family reports that patient stopped taking risperidone 3 years ago, and has had two delusional episodes 1--after coming off medication pt thought she was recovering from a c-section and had a newborn and 2--most recent episode where pt is having conversations with deceased father and husband.   Pt was poor historian with rambling speech and flight of ideas in today's reassessment. Patient speaks of deceased family members, her daughter not getting raises on her job, pain in her right side, getting a "gel shot", going to [place] for her nerve pills, getting attacked by the police, etc. Patient was difficult to follow in today's encounter and a poor historian. Upon chart review, pt recently threw out all of her medications because "they were not right". Daughters concerned about her safety. Pts daughter [name] is legal guardian. Today, patient says that she has been compliant with all medications. Also, notes that she takes Haldol. She acknowledge that she goes to [facility] for medication management. Pt denies current SI, HI, and AVH.  Per Mordecai Maes, NP, patient to continued [place] placement. Disposition LCSW to seek appropriate placement .

## 2020-04-09 NOTE — ED Notes (Signed)
Assuming care of patient at this time. Pt is resting with eyes closed and in NAD. Bed in low position.

## 2020-04-09 NOTE — ED Provider Notes (Signed)
Patient is being transferred to [facility].  She is stable. Accepting is Dr. Sidney Ace, MD 04/09/20 [phone]

## 2020-04-09 NOTE — ED Notes (Signed)
Provided patient with crackers and cheese with a Coke per request. Pt assisted to bathroom. Pt ambulatory with steady gait.

## 2020-04-13 ENCOUNTER — Telehealth: Payer: Self-pay | Admitting: Family Medicine

## 2020-04-13 NOTE — Telephone Encounter (Signed)
Patient is calling to inform Dr. Arby Barrette that she is in [facility]. She states her sister has placed her in the mental health center.   Patient seemed very confused but knew that she asked the doctors there to send over her lab work.

## 2020-04-14 ENCOUNTER — Telehealth: Payer: Self-pay | Admitting: Orthopedic Surgery

## 2020-04-14 ENCOUNTER — Other Ambulatory Visit: Payer: Self-pay

## 2020-04-14 ENCOUNTER — Telehealth: Payer: Self-pay | Admitting: Family Medicine

## 2020-04-14 MED ORDER — DAPAGLIFLOZIN PROPANEDIOL 10 MG PO TABS
10.0000 mg | ORAL_TABLET | Freq: Every day | ORAL | 2 refills | Status: DC
Start: 2020-04-14 — End: 2020-04-16

## 2020-04-14 NOTE — Telephone Encounter (Signed)
She has to get this through PCP

## 2020-04-14 NOTE — Telephone Encounter (Signed)
**  After Hours/ Emergency Line Call**  Received after hours page to call [name]. Called x2 and phone goes to voicemail. LVM to return call to [facility] if needed.   [name], DO PGY-2, [facility] 04/14/2020 6:55 PM

## 2020-04-14 NOTE — Telephone Encounter (Signed)
LMOM for patient of the below message  

## 2020-04-14 NOTE — Telephone Encounter (Signed)
Thanks for letting me know!

## 2020-04-14 NOTE — Telephone Encounter (Signed)
Please advise 

## 2020-04-14 NOTE — Telephone Encounter (Signed)
Patient called. Says she would like a RX for Ibuprofen 800 called in to [facility] on [address]. Her call back number is [phone]

## 2020-04-16 ENCOUNTER — Other Ambulatory Visit: Payer: Self-pay

## 2020-04-16 ENCOUNTER — Telehealth: Payer: Self-pay | Admitting: Family Medicine

## 2020-04-16 ENCOUNTER — Encounter (HOSPITAL_COMMUNITY): Payer: Self-pay

## 2020-04-16 ENCOUNTER — Ambulatory Visit (HOSPITAL_COMMUNITY)
Admission: EM | Admit: 2020-04-16 | Discharge: 2020-04-16 | Disposition: A | Discharge status: Home / Self Care | Payer: Medicare (Managed Care) | Attending: Family Medicine | Admitting: Family Medicine

## 2020-04-16 DIAGNOSIS — I10 Essential (primary) hypertension: Secondary | ICD-10-CM

## 2020-04-16 DIAGNOSIS — E1165 Type 2 diabetes mellitus with hyperglycemia: Secondary | ICD-10-CM | POA: Diagnosis not present

## 2020-04-16 DIAGNOSIS — E782 Mixed hyperlipidemia: Secondary | ICD-10-CM | POA: Diagnosis not present

## 2020-04-16 DIAGNOSIS — E876 Hypokalemia: Secondary | ICD-10-CM

## 2020-04-16 DIAGNOSIS — J3089 Other allergic rhinitis: Secondary | ICD-10-CM | POA: Diagnosis not present

## 2020-04-16 DIAGNOSIS — E1122 Type 2 diabetes mellitus with diabetic chronic kidney disease: Secondary | ICD-10-CM

## 2020-04-16 DIAGNOSIS — N183 Chronic kidney disease, stage 3 unspecified: Secondary | ICD-10-CM

## 2020-04-16 DIAGNOSIS — K219 Gastro-esophageal reflux disease without esophagitis: Secondary | ICD-10-CM

## 2020-04-16 DIAGNOSIS — J45909 Unspecified asthma, uncomplicated: Secondary | ICD-10-CM

## 2020-04-16 DIAGNOSIS — J449 Chronic obstructive pulmonary disease, unspecified: Secondary | ICD-10-CM

## 2020-04-16 MED ORDER — POTASSIUM CHLORIDE ER 10 MEQ PO TBCR: 10.0000 meq | EXTENDED_RELEASE_TABLET | Freq: Every day | ORAL | 0 refills | Status: DC

## 2020-04-16 MED ORDER — HYDROCHLOROTHIAZIDE 25 MG PO TABS: 25.0000 mg | ORAL_TABLET | Freq: Every day | ORAL | 0 refills | Status: DC

## 2020-04-16 MED ORDER — ROSUVASTATIN CALCIUM 20 MG PO TABS: 20.0000 mg | ORAL_TABLET | Freq: Every day | ORAL | 0 refills | Status: DC

## 2020-04-16 MED ORDER — DAPAGLIFLOZIN PROPANEDIOL 10 MG PO TABS: 10.0000 mg | ORAL_TABLET | Freq: Every day | ORAL | 0 refills | Status: DC

## 2020-04-16 MED ORDER — IBUPROFEN 800 MG PO TABS: 800.0000 mg | ORAL_TABLET | Freq: Every day | ORAL | 0 refills | Status: DC

## 2020-04-16 MED ORDER — ALBUTEROL SULFATE HFA 108 (90 BASE) MCG/ACT IN AERS: INHALATION_SPRAY | RESPIRATORY_TRACT | 2 refills | Status: DC

## 2020-04-16 MED ORDER — METFORMIN HCL 1000 MG PO TABS: 1000.0000 mg | ORAL_TABLET | Freq: Two times a day (BID) | ORAL | 0 refills | Status: DC

## 2020-04-16 MED ORDER — OMEPRAZOLE 40 MG PO CPDR: 40.0000 mg | DELAYED_RELEASE_CAPSULE | Freq: Two times a day (BID) | ORAL | 0 refills | Status: DC

## 2020-04-16 MED ORDER — AMLODIPINE BESYLATE 5 MG PO TABS: 5.0000 mg | ORAL_TABLET | Freq: Every day | ORAL | 0 refills | Status: DC

## 2020-04-16 MED ORDER — MONTELUKAST SODIUM 10 MG PO TABS: 10.0000 mg | ORAL_TABLET | Freq: Every day | ORAL | 0 refills | Status: DC

## 2020-04-16 MED ORDER — JANUVIA 25 MG PO TABS: 25.0000 mg | ORAL_TABLET | Freq: Every day | ORAL | 0 refills | Status: DC

## 2020-04-16 MED ORDER — METOPROLOL SUCCINATE ER 50 MG PO TB24: 50.0000 mg | ORAL_TABLET | Freq: Every day | ORAL | 0 refills | Status: DC

## 2020-04-16 MED ORDER — LEVOTHYROXINE SODIUM 25 MCG PO TABS
25.0000 ug | ORAL_TABLET | Freq: Every day | ORAL | 0 refills | Status: DC
Start: 2020-04-16 — End: 2020-08-07

## 2020-04-16 MED ORDER — FLUTICASONE-UMECLIDIN-VILANT 100-62.5-25 MCG/INH IN AEPB: 100.0000 ug | INHALATION_SPRAY | Freq: Every day | RESPIRATORY_TRACT | 0 refills | Status: DC

## 2020-04-16 NOTE — ED Notes (Addendum)
TC to Pt's Daughter to inform her MS [name] Long was ready for her to be picked up at [facility]. Pt's  Daughter reported her Mother was going to take a cab home.

## 2020-04-16 NOTE — Telephone Encounter (Signed)
Patient called and wanted to make an appointment for this morning. I informed her we had no appointments today but I could schedule for next week. I thought she needed this for her refills. She demanded that she had a refill sent. I told her if she had further questions about her medications she could speak with a nurse. I transferred to nurse line.   She called back and stated that she needed an appointment today and she did not have time to leave a voicemail for the nurse. She then tells me she is bleeding from her nose and throat and would have to be seen today. I told her we did not have any appointments and informed her to be seen today she would need to go to urgent care.   While speaking to her I said "yes mam" she became very mad and said I should not call her "mam" because I know exactly who she is. She then said "[name] Long you better have all your shit packed up and leave family practice before I get there" patient then hung up phone.

## 2020-04-16 NOTE — ED Notes (Signed)
Pt was informed that her Daughter Larey Seat wanted her to take a cab home,Pt voiced understanding.  Pt ambulatory to lobby no assistance from staff . Pt using her cane while walking.

## 2020-04-16 NOTE — ED Triage Notes (Signed)
Pt presents for sinus issues: pt states she has chronic sinus pressure, congestion, bilateral ear discomfort, and throat discomfort.

## 2020-04-19 NOTE — Telephone Encounter (Signed)
Called by patient.  Choking in my esophagus and epigastric pain.   Hurts at time to swallow. Dyspnea at time. Hoarseness/weak voice. Sucralfate 1 g TIDAC and HS liquid and omeprazole 40 mg BID, both seem to help but not eliminating the above symptoms Eating soft food and at time regular foods.   Fried food and eating out are harder for her  Lost 12 pounds.  She doesn't recall being seen in the office 6 weeks ago  Previous EGD/colon/barium swallow and [place] abd rather unrevealing. 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 She has to arrange transportation Would like Thurs or Fri at [facility] if possible May need labs before hand, if so, check CMP, CBC Thanks

## 2020-04-20 ENCOUNTER — Other Ambulatory Visit: Payer: Self-pay

## 2020-04-20 ENCOUNTER — Telehealth (HOSPITAL_COMMUNITY): Payer: Self-pay

## 2020-04-20 DIAGNOSIS — R109 Unspecified abdominal pain: Secondary | ICD-10-CM

## 2020-04-20 NOTE — Telephone Encounter (Signed)
Pt legal guardian called and ask if we can call her before prescribe any medication to the pt. Legal guardian states the pt show up at home with a medication starts with the "T" she do not remember the name and she trashed.

## 2020-04-20 NOTE — ED Provider Notes (Signed)
[place]    CSN: [id number] Arrival date & time: 04/16/20  1016      History   Chief Complaint Chief Complaint  Patient presents with  . Sinus Issue  . Otalgia  . Sore Throat    HPI [name] Long is a 69 y.o. female.   Here today for concerns of several months of poorly controlled sinus drainage, cough, ear pressure and popping, scratchy throat. Endorses known hx of allergic rhinitis and states she's intolerant to all OTC allergy medications. Denies fever, chills, body aches, CP, SOB. Recent resp panel last week all negative and sxs and exposures unchanged since. Taking OTC cough remedies currently without much relief.   Also notes she lost all of her regular medications and cannot see her PCP for refills at this time. Requesting refills on all. Has had recent f/u and tolerating medications well.      Past Medical History:  Diagnosis Date  . Adenomatous colon polyp   . Arthritis    "real bad; all over" (07/12/2017)  . Asthma   . Atrial fibrillation ([place])   . BIPOLAR DISORDER 08/31/2006   Qualifier: Diagnosis of  By: Dorathy Daft MD, Marjory Lies    . CHRONIC KIDNEY DISEASE STAGE II (MILD) 09/14/2009   Annotation: eGFR 64 Qualifier: Diagnosis of  By: Jess Barters MD, [name]    . Chronic lower back pain   . Congestive heart failure (CHF) ([place])   . COPD (chronic obstructive pulmonary disease) ([place]) 09/23/2010   Diagnosed at [facility] in 2008 (Dr. Annamaria Boots)   . DM (diabetes mellitus) type II controlled with renal manifestation ([place]) 08/31/2006   Qualifier: Diagnosis of  By: Dorathy Daft MD, Marjory Lies    . Dyspnea    "all my life; since 6th grade" (07/12/2017)  . Fatty liver   . HYPERCHOLESTEROLEMIA 08/31/2006   Intolerance to Lipitor OK on Crestor but medicaid no longer covering    . HYPERTENSION, BENIGN SYSTEMIC 08/31/2006   Qualifier: Diagnosis of  By: Dorathy Daft MD, Marjory Lies    . Hypokalemia   . HYPOTHYROIDISM, UNSPECIFIED 08/31/2006   Qualifier: Diagnosis of  By: Dorathy Daft MD, Marjory Lies     . Leg swelling 03/14/2018  . Pneumonia    "3 times" (07/12/2017)  . Schizophrenia ([place])   . Scoliosis     Patient Active Problem List   Diagnosis Date Noted  . Bipolar I disorder, most recent episode depressed, severe w psychosis ([place])   . Dementia associated with other underlying disease with behavioral disturbance ([place]) 03/27/2020  . Abscessed tooth 02/09/2020  . GERD (gastroesophageal reflux disease) 12/27/2019  . Post-menopausal atrophic vaginitis 12/13/2019  . Vagina, candidiasis 12/10/2019  . Vaginal itching 11/10/2019  . Dysphagia 07/02/2019  . Screening examination for STD (sexually transmitted disease) 03/25/2019  . 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 Other chronic pain 12/03/2018  . Dizziness of unknown cause 08/04/2018  . Tremor 07/15/2018  . Atrial flutter with rapid ventricular response ([place]) 07/10/2017  . Atrial fibrillation ([place]) 07/05/2017  . COPD (chronic obstructive pulmonary disease) ([place]) 01/22/2017  . Pedal edema   . Acute on chronic congestive heart failure ([place])   . Chronic bilateral low back pain without sciatica 08/15/2016  . 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 Schizoaffective disorder ([place])   . Hypokalemia 01/22/2015  . Osteoarthritis, multiple sites 06/08/2011  . Dysuria 02/14/2011  . CHRONIC KIDNEY DISEASE STAGE II (MILD) 09/14/2009  . Hypothyroidism 08/31/2006  . Type 2 diabetes mellitus ([place]) 08/31/2006  . HYPERCHOLESTEROLEMIA  08/31/2006  . Tobacco abuse 08/31/2006  . HYPERTENSION, BENIGN SYSTEMIC 08/31/2006    Past Surgical History:  Procedure Laterality Date  . CESAREAN SECTION    . FOOT FRACTURE SURGERY Right    "steel plate in it"  . FOOT SURGERY     " born w/dislocated foot"  . FRACTURE SURGERY    . KNEE ARTHROSCOPY Right   . TOE SURGERY Bilateral    "both pinky toes"  . TONSILLECTOMY AND ADENOIDECTOMY      OB History   No obstetric history on file.      Home  Medications    Prior to Admission medications   Medication Sig Start Date End Date Taking? Authorizing Provider  acetaminophen (TYLENOL) 500 MG tablet Take 1,000 mg by mouth every 6 (six) hours as needed for mild pain or headache.    [provider]  albuterol (VENTOLIN HFA) 108 (90 Base) MCG/ACT inhaler TAKE 2 PUFFS BY MOUTH EVERY 4 HOURS AS NEEDED FOR WHEEZE 04/16/20   Volney American, PA-C  amLODipine (NORVASC) 5 MG tablet Take 1 tablet (5 mg total) by mouth daily. 04/16/20   Volney American, PA-C  Blood Glucose Monitoring Suppl (ACCU-CHEK GUIDE) w/Device KIT 1 each by Does not apply route 4 (four) times daily -  before meals and at bedtime. 11/25/19   Guadalupe Dawn, MD  cyclobenzaprine (FLEXERIL) 10 MG tablet Take 1 tablet (10 mg total) by mouth 3 (three) times daily as needed for muscle spasms. 01/17/20   Newt Minion, MD  cyclobenzaprine (FLEXERIL) 5 MG tablet Take 1 tablet (5 mg total) by mouth 3 (three) times daily as needed for muscle spasms. Patient not taking: Reported on 04/05/2020 01/17/20   Newt Minion, MD  dapagliflozin propanediol (FARXIGA) 10 MG TABS tablet Take 1 tablet (10 mg total) by mouth daily. 04/16/20   Volney American, PA-C  estradiol (ESTRACE) 0.1 MG/GM vaginal cream Place 1 Applicatorful vaginally daily as needed (estorgen level).  02/16/20   [provider]  Fluticasone-Umeclidin-Vilant 100-62.5-25 MCG/INH AEPB Inhale 100 mcg into the lungs daily. 04/16/20   Volney American, PA-C  glucose blood test strip Use to check blood sugar 4x a day Dx Code: E11.22 11/21/19   Guadalupe Dawn, MD  hydrochlorothiazide (HYDRODIURIL) 25 MG tablet Take 1 tablet (25 mg total) by mouth daily. 04/16/20   Volney American, PA-C  ibuprofen (ADVIL) 800 MG tablet Take 1 tablet (800 mg total) by mouth daily. 04/16/20   Volney American, PA-C  JANUVIA 25 MG tablet Take 1 tablet (25 mg total) by mouth daily. 04/16/20   Volney American, PA-C  levothyroxine (SYNTHROID) 25 MCG tablet Take 1 tablet (25 mcg total) by mouth daily before breakfast. 04/16/20   Volney American, PA-C  metFORMIN (GLUCOPHAGE) 1000 MG tablet Take 1 tablet (1,000 mg total) by mouth 2 (two) times daily with a meal. 04/16/20   Volney American, PA-C  metoprolol succinate (TOPROL-XL) 50 MG 24 hr tablet Take 1 tablet (50 mg total) by mouth daily. Take with or immediately following a meal. 04/16/20   Volney American, PA-C  montelukast (SINGULAIR) 10 MG tablet Take 1 tablet (10 mg total) by mouth at bedtime. 04/16/20   Volney American, PA-C  olopatadine (PATANOL) 0.1 % ophthalmic solution Place 1 drop into both eyes 2 (two) times daily as needed for allergies. 10/05/17   Kathrene Alu, MD  omeprazole (PRILOSEC) 40 MG capsule Take 1 capsule (40 mg total) by  mouth 2 (two) times daily. 04/16/20   Volney American, PA-C  potassium chloride (KLOR-CON) 10 MEQ tablet Take 1 tablet (10 mEq total) by mouth daily. 04/16/20   Volney American, PA-C  rosuvastatin (CRESTOR) 20 MG tablet Take 1 tablet (20 mg total) by mouth daily. 04/16/20   Volney American, PA-C  sucralfate (CARAFATE) 1 GM/10ML suspension Take 10 mLs (1 g total) by mouth 2 (two) times daily. 03/22/20   Armbruster, Carlota Raspberry, MD  albuterol (VENTOLIN HFA) 108 (90 Base) MCG/ACT inhaler INHALE 2 PUFFS EVERY 4 HOURS AS NEEDED FOR WHEEZING 12/06/19   Guadalupe Dawn, MD    Family History Family History  Problem Relation Age of Onset  . Breast cancer Sister   . Heart disease Mother   . Rectal cancer Mother   . Diabetes Father   . Hypertension Father   . Heart disease Brother   . Asthma Daughter   . Asthma Son   . Breast cancer Sister   . Asthma Daughter   . Colon cancer Maternal Grandmother   . Stomach cancer Neg Hx   . Esophageal cancer Neg Hx   . Pancreatic cancer Neg Hx     Social History Social History   Tobacco Use  . Smoking status: Current  Some Day Smoker    Packs/day: 0.25    Years: 45.00    Pack years: 11.25    Types: Cigarettes  . Smokeless tobacco: Never Used  . Tobacco comment: patient states she smokes 1 cigarette per month  Vaping Use  . Vaping Use: Never used  Substance Use Topics  . Alcohol use: Not Currently    Comment: 07/12/2017 "stopped 2 yr ago; did have a drink over the holidays recently"  . Drug use: No     Allergies   Citrus, Fish allergy, Shellfish allergy, Adhesive [tape], Latex, Lipitor [atorvastatin calcium], Lisinopril, Other, and Tramadol   Review of Systems Review of Systems PER HPI    Physical Exam Triage Vital Signs ED Triage Vitals  Enc Vitals Group     BP 04/16/20 1112 (!) 146/76     Pulse Rate 04/16/20 1112 (!) 101     Resp 04/16/20 1112 17     Temp 04/16/20 1112 98.8 F (37.1 C)     Temp Source 04/16/20 1112 Oral     SpO2 04/16/20 1112 98 %     Weight --      Height --      Head Circumference --      Peak Flow --      Pain Score 04/16/20 1115 3     Pain Loc --      Pain Edu? --      Excl. in [place]? --    No data found.  Updated Vital Signs BP (!) 146/76 (BP Location: Right Arm)   Pulse (!) 101   Temp 98.8 F (37.1 C) (Oral)   Resp 17   SpO2 98%   Visual Acuity Right Eye Distance:   Left Eye Distance:   Bilateral Distance:    Right Eye Near:   Left Eye Near:    Bilateral Near:     Physical Exam Vitals and nursing note reviewed.  Constitutional:      Appearance: Normal appearance. She is not ill-appearing.  HENT:     Head: Atraumatic.     Right Ear: Tympanic membrane normal.     Left Ear: Tympanic membrane normal.     Nose: Rhinorrhea present.  Comments: Nasal mucosa erythematous and edematous b/l    Mouth/Throat:     Mouth: Mucous membranes are moist.     Pharynx: Posterior oropharyngeal erythema present.  Eyes:     Extraocular Movements: Extraocular movements intact.     Conjunctiva/sclera: Conjunctivae normal.  Cardiovascular:     Rate and  Rhythm: Normal rate and regular rhythm.     Heart sounds: Normal heart sounds.  Pulmonary:     Effort: Pulmonary effort is normal.     Breath sounds: Normal breath sounds. No wheezing or rales.  Abdominal:     General: Bowel sounds are normal. There is no distension.     Palpations: Abdomen is soft.     Tenderness: There is no abdominal tenderness.  Musculoskeletal:        General: Normal range of motion.     Cervical back: Normal range of motion and neck supple.  Skin:    General: Skin is warm and dry.  Neurological:     Mental Status: She is alert. Mental status is at baseline.  Psychiatric:        Mood and Affect: Mood normal.        Thought Content: Thought content normal.        Judgment: Judgment normal.      UC Treatments / Results  Labs (all labs ordered are listed, but only abnormal results are displayed) Labs Reviewed - No data to display  EKG   Radiology No results found.  Procedures Procedures (including critical care time)  Medications Ordered in UC Medications - No data to display  Initial Impression / Assessment and Plan / UC Course  I have reviewed the triage vital signs and the nursing notes.  Pertinent labs & imaging results that were available during my care of the patient were reviewed by me and considered in my medical decision making (see chart for details).     Will trial singulair, encouraged flonase twice daily if tolerated and plain mucinex or coricidin prn. Refuses repeat resp panel today as recent test negative.  Will refill all chronic medications for 30 days, pt aware she will need to f/u with PCP for further refills as needed.   Final Clinical Impressions(s) / UC Diagnoses   Final diagnoses:  Seasonal allergic rhinitis due to other allergic trigger  Essential hypertension  Mixed hyperlipidemia  Type 2 diabetes mellitus with hyperglycemia, without long-term current use of insulin [facility])   Discharge Instructions   None    ED  Prescriptions    Medication Sig Dispense Auth. Provider   rosuvastatin (CRESTOR) 20 MG tablet Take 1 tablet (20 mg total) by mouth daily. 30 tablet Volney American, [place]   potassium chloride (KLOR-CON) 10 MEQ tablet Take 1 tablet (10 mEq total) by mouth daily. 30 tablet Volney American, [place]   omeprazole (PRILOSEC) 40 MG capsule Take 1 capsule (40 mg total) by mouth 2 (two) times daily. 60 capsule Volney American, [place]   metoprolol succinate (TOPROL-XL) 50 MG 24 hr tablet Take 1 tablet (50 mg total) by mouth daily. Take with or immediately following a meal. 30 tablet Volney American, PA-C   metFORMIN (GLUCOPHAGE) 1000 MG tablet Take 1 tablet (1,000 mg total) by mouth 2 (two) times daily with a meal. 60 tablet Volney American, PA-C   levothyroxine (SYNTHROID) 25 MCG tablet Take 1 tablet (25 mcg total) by mouth daily before breakfast. 30 tablet Volney American, PA-C   JANUVIA 25 MG tablet Take 1  tablet (25 mg total) by mouth daily. 30 tablet Volney American, [place]   ibuprofen (ADVIL) 800 MG tablet Take 1 tablet (800 mg total) by mouth daily. 30 tablet Volney American, [place]   hydrochlorothiazide (HYDRODIURIL) 25 MG tablet Take 1 tablet (25 mg total) by mouth daily. 30 tablet Volney American, [place]   Fluticasone-Umeclidin-Vilant 100-62.5-25 MCG/INH AEPB Inhale 100 mcg into the lungs daily. Powers, PA-C   dapagliflozin propanediol (FARXIGA) 10 MG TABS tablet Take 1 tablet (10 mg total) by mouth daily. 30 tablet Volney American, [place]   amLODipine (NORVASC) 5 MG tablet Take 1 tablet (5 mg total) by mouth daily. 30 tablet Volney American, [place]   albuterol (VENTOLIN HFA) 108 (90 Base) MCG/ACT inhaler TAKE 2 PUFFS BY MOUTH EVERY 4 HOURS AS NEEDED FOR WHEEZE 18 g Volney American, PA-C   montelukast (SINGULAIR) 10 MG tablet Take 1 tablet (10 mg total) by mouth at bedtime. 30 tablet Volney American,  [place]     PDMP not reviewed this encounter.   Merrie Roof [place], [place] 04/20/20 [phone]

## 2020-04-21 ENCOUNTER — Ambulatory Visit (INDEPENDENT_AMBULATORY_CARE_PROVIDER_SITE_OTHER): Payer: Medicare (Managed Care) | Admitting: Orthopedic Surgery

## 2020-04-21 ENCOUNTER — Encounter: Payer: Self-pay | Admitting: Orthopedic Surgery

## 2020-04-21 ENCOUNTER — Telehealth: Payer: Self-pay | Admitting: Internal Medicine

## 2020-04-21 ENCOUNTER — Other Ambulatory Visit: Payer: Self-pay

## 2020-04-21 VITALS — Ht 65.0 in | Wt 142.0 lb

## 2020-04-21 DIAGNOSIS — G8929 Other chronic pain: Secondary | ICD-10-CM

## 2020-04-21 DIAGNOSIS — M170 Bilateral primary osteoarthritis of knee: Secondary | ICD-10-CM

## 2020-04-21 DIAGNOSIS — M545 Low back pain, unspecified: Secondary | ICD-10-CM

## 2020-04-21 DIAGNOSIS — M542 Cervicalgia: Secondary | ICD-10-CM | POA: Diagnosis not present

## 2020-04-21 MED ORDER — SUCRALFATE 1 GM/10ML PO SUSP: 1.0000 g | Freq: Four times a day (QID) | ORAL | 0 refills | Status: DC

## 2020-04-21 NOTE — Telephone Encounter (Signed)
The only prescription we are giving her is sucralfate and this was sent today to [facility] per our records. Other meds need to be filled by her PCP. I did change her preferred pharmacy to [facility] and have resent the sucralfate to that pharmacy.

## 2020-04-21 NOTE — Telephone Encounter (Signed)
See additional phone note. 

## 2020-04-21 NOTE — Telephone Encounter (Signed)
Pt scheduled for CT at [facility] 04/30/20@9 :30am. Pt to arrive there at 9:15am. Pt to be NPO after 5:30am, drink bottle 1 of contrast at 7:30am, bottle 2 at 8:30am. Left message for pt to call back. Pt had current labs so no labs ordered.

## 2020-04-21 NOTE — Telephone Encounter (Signed)
Spoke with pt and she is aware. Pt also requested a refill on her carafate for qid dosing. Script sent to pharmacy for pt.

## 2020-04-21 NOTE — Telephone Encounter (Signed)
Patient called states Dr. Hilarie Fredrickson called her on Sunday to advise her to make an urgent appointment for this week with him. Please advise

## 2020-04-25 ENCOUNTER — Telehealth: Payer: Self-pay | Admitting: Family Medicine

## 2020-04-25 MED ORDER — JANUVIA 25 MG PO TABS
25.0000 mg | ORAL_TABLET | Freq: Every day | ORAL | 0 refills | Status: DC
Start: 2020-04-25 — End: 2020-04-28

## 2020-04-25 NOTE — Telephone Encounter (Signed)
AFTER HOURS Call   Patient calls after hours line reporting that she has been experiencing increased leg swelling this week in bilateral ankles and feet. Patient reports having history of neuropathy and that her feet her and painful and burning. Legs are tender and aching and states that this is improved with taking her Januvia but she has been out of this medication for ~9 weeks. Patient reports that her last blood glucose was 149 after a meal. Patient denies any redness of legs or wounds.  Refill for 30tabs of Januvia sent to [facility].

## 2020-04-27 ENCOUNTER — Telehealth: Payer: Self-pay

## 2020-04-27 ENCOUNTER — Encounter: Payer: Self-pay | Admitting: Orthopedic Surgery

## 2020-04-27 DIAGNOSIS — H01004 Unspecified blepharitis left upper eyelid: Secondary | ICD-10-CM | POA: Diagnosis not present

## 2020-04-27 DIAGNOSIS — H01001 Unspecified blepharitis right upper eyelid: Secondary | ICD-10-CM | POA: Diagnosis not present

## 2020-04-27 NOTE — Telephone Encounter (Addendum)
I called and spoke with patients POA her granddaughter and asked if she could bring the medication list for the patient to the office today or fax the information to our office she said she would try

## 2020-04-27 NOTE — Progress Notes (Signed)
Office Visit Note   Patient: [name] Long           Date of Birth: 21-Jun-1951           MRN: [id number] Visit Date: 04/21/2020              Requested by: Shary Key, DO [place],  [place] [id number] PCP: Shary Key, DO  Chief Complaint  Patient presents with  . Right Shoulder - Pain  . Left Knee - Pain  . Lower Back - Pain      HPI: Patient is a 69 year old woman who presents with multiple complaints of pain throughout her body patient states she wants a full body x-ray.  She states that everything hurts she has been having back pain states that she felt a crack in her neck.  She states she has hip pain.  States that she has had injections for her knees that it helped in the past.  Patient states that she has had episodes of getting dizzy and falling she states she has been on Depakote now.  She states that she was in a psychiatric hospital for 13 days.  Patient states she has had no relief with muscle relaxers in the past.  Patient states she has lost 12 pounds secondary to not eating.  Assessment & Plan: Visit Diagnoses:  1. Chronic bilateral low back pain without sciatica   2. Bilateral primary osteoarthritis of knee   3. Neck pain     Plan: Recommended patient follow-up with her primary care physician to see why she is getting dizzy and falling.  Recommended physical therapy patient states she does not want to proceed with physical therapy.  Discussed that we could request authorization for a hyaluronic injection in the left knee and then get approval for injection in the right knee.  Will follow up once this is approved.  Follow-Up Instructions: Return if symptoms worsen or fail to improve.   Ortho Exam  Patient is alert, oriented, no adenopathy, well-dressed, normal affect, normal respiratory effort. Examination patient has pain with range of motion of her neck complains of pain in the lumbar spine has a negative straight leg raise  bilaterally no focal motor weakness in either lower extremity there is no effusion of either knee but there is crepitation with range of motion of both knees and she complains of pain with range of motion.  Patient does have venous swelling in both legs but patient states that she will not wear her compression socks.  Patient states that recently she has fallen in a sitting position.  She has no pain with sitting at this time.  Imaging: No results found. No images are attached to the encounter.  Labs: Lab Results  Component Value Date   HGBA1C 10.3 (A) 03/23/2020   HGBA1C 12.0 (A) 02/03/2020   HGBA1C 12.2 (A) 10/30/2019   LABORGA NO GROWTH 12/04/2014     Lab Results  Component Value Date   ALBUMIN 3.3 (L) 04/03/2020   ALBUMIN 4.0 03/04/2020   ALBUMIN 3.4 (L) 09/08/2018    Lab Results  Component Value Date   MG 1.7 12/25/2016   MG 1.8 06/10/2016   MG 1.8 02/02/2016   Lab Results  Component Value Date   VD25OH 43.0 02/23/2018   VD25OH 30 05/06/2015   VD25OH 12 (L) 12/18/2014    No results found for: PREALBUMIN CBC EXTENDED Latest Ref Rng & Units 04/03/2020 03/09/2020 03/04/2020  WBC 4.0 - 10.5  K/uL 6.7 8.0 8.9  RBC 3.87 - 5.11 MIL/uL 3.94 4.07 4.37  HGB 12.0 - 15.0 g/dL 12.2 13.0 14.2  HCT 36 - 46 % 36.8 39.6 43.0  PLT 150 - 400 K/uL 243 188 213.0  NEUTROABS 1.4 - 7.7 K/uL - - 4.2  LYMPHSABS 0.7 - 4.0 K/uL - - 3.3     Body mass index is 23.63 kg/m.  Orders:  No orders of the defined types were placed in this encounter.  No orders of the defined types were placed in this encounter.    Procedures: No procedures performed  Clinical Data: No additional findings.  ROS:  All other systems negative, except as noted in the HPI. Review of Systems  Objective: Vital Signs: Ht 5' 5" (1.651 m)   Wt 142 lb (64.4 kg)   BMI 23.63 kg/m   Specialty Comments:  No specialty comments available.  PMFS History: Patient Active Problem List   Diagnosis Date Noted  .  Bipolar I disorder, most recent episode depressed, severe w psychosis ([place])   . Dementia associated with other underlying disease with behavioral disturbance ([place]) 03/27/2020  . Abscessed tooth 02/09/2020  . GERD (gastroesophageal reflux disease) 12/27/2019  . Post-menopausal atrophic vaginitis 12/13/2019  . Vagina, candidiasis 12/10/2019  . Vaginal itching 11/10/2019  . Dysphagia 07/02/2019  . Screening examination for STD (sexually transmitted disease) 03/25/2019  . 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 Other chronic pain 12/03/2018  . Dizziness of unknown cause 08/04/2018  . Tremor 07/15/2018  . Atrial flutter with rapid ventricular response ([place]) 07/10/2017  . Atrial fibrillation ([place]) 07/05/2017  . COPD (chronic obstructive pulmonary disease) ([place]) 01/22/2017  . Pedal edema   . Acute on chronic congestive heart failure ([place])   . Chronic bilateral low back pain without sciatica 08/15/2016  . 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 Schizoaffective disorder ([place])   . Hypokalemia 01/22/2015  . Osteoarthritis, multiple sites 06/08/2011  . Dysuria 02/14/2011  . CHRONIC KIDNEY DISEASE STAGE II (MILD) 09/14/2009  . Hypothyroidism 08/31/2006  . Type 2 diabetes mellitus ([place]) 08/31/2006  . HYPERCHOLESTEROLEMIA 08/31/2006  . Tobacco abuse 08/31/2006  . HYPERTENSION, BENIGN SYSTEMIC 08/31/2006   Past Medical History:  Diagnosis Date  . Adenomatous colon polyp   . Arthritis    "real bad; all over" (07/12/2017)  . Asthma   . Atrial fibrillation ([place])   . BIPOLAR DISORDER 08/31/2006   Qualifier: Diagnosis of  By: Dorathy Daft MD, Marjory Lies    . CHRONIC KIDNEY DISEASE STAGE II (MILD) 09/14/2009   Annotation: eGFR 64 Qualifier: Diagnosis of  By: Jess Barters MD, [name]    . Chronic lower back pain   . Congestive heart failure (CHF) ([place])   . COPD (chronic obstructive pulmonary disease) ([place]) 09/23/2010   Diagnosed at [facility] in 2008 (Dr. Annamaria Boots)   . DM  (diabetes mellitus) type II controlled with renal manifestation ([place]) 08/31/2006   Qualifier: Diagnosis of  By: Dorathy Daft MD, Marjory Lies    . Dyspnea    "all my life; since 6th grade" (07/12/2017)  . Fatty liver   . HYPERCHOLESTEROLEMIA 08/31/2006   Intolerance to Lipitor OK on Crestor but medicaid no longer covering    . HYPERTENSION, BENIGN SYSTEMIC 08/31/2006   Qualifier: Diagnosis of  By: Dorathy Daft MD, Marjory Lies    . Hypokalemia   . HYPOTHYROIDISM, UNSPECIFIED 08/31/2006   Qualifier: Diagnosis of  By: Dorathy Daft MD, Marjory Lies    . Leg swelling 03/14/2018  . Pneumonia    "  3 times" (07/12/2017)  . Schizophrenia ([facility])   . Scoliosis   . Stomach problems   . Thyroid disorder     Family History  Problem Relation Age of Onset  . Breast cancer Sister   . Heart disease Mother   . Rectal cancer Mother   . Diabetes Father   . Hypertension Father   . Heart disease Brother   . Asthma Daughter   . Asthma Son   . Breast cancer Sister   . Asthma Daughter   . Colon cancer Maternal Grandmother   . Stomach cancer Neg Hx   . Esophageal cancer Neg Hx   . Pancreatic cancer Neg Hx     Past Surgical History:  Procedure Laterality Date  . CESAREAN SECTION    . FOOT FRACTURE SURGERY Right    "steel plate in it"  . FOOT SURGERY     " born w/dislocated foot"  . FRACTURE SURGERY    . KNEE ARTHROSCOPY Right   . TOE SURGERY Bilateral    "both pinky toes"  . TONSILLECTOMY AND ADENOIDECTOMY     Social History   Occupational History  . Not on file  Tobacco Use  . Smoking status: Current Some Day Smoker    Packs/day: 0.25    Years: 45.00    Pack years: 11.25    Types: Cigarettes  . Smokeless tobacco: Never Used  . Tobacco comment: patient states she smokes 1 cigarette per month  Vaping Use  . Vaping Use: Never used  Substance and Sexual Activity  . Alcohol use: Not Currently    Comment: 07/12/2017 "stopped 2 yr ago; did have a drink over the holidays recently"  . Drug use: No  . Sexual activity: Not  Currently       

## 2020-04-28 ENCOUNTER — Ambulatory Visit (INDEPENDENT_AMBULATORY_CARE_PROVIDER_SITE_OTHER): Payer: Medicare (Managed Care) | Admitting: Family

## 2020-04-28 ENCOUNTER — Other Ambulatory Visit: Payer: Self-pay

## 2020-04-28 ENCOUNTER — Encounter: Payer: Self-pay | Admitting: Family

## 2020-04-28 VITALS — BP 106/58 | HR 76 | Temp 97.8°F | Resp 16 | Ht 65.0 in | Wt 148.4 lb

## 2020-04-28 DIAGNOSIS — N183 Chronic kidney disease, stage 3 unspecified: Secondary | ICD-10-CM

## 2020-04-28 DIAGNOSIS — R42 Dizziness and giddiness: Secondary | ICD-10-CM

## 2020-04-28 DIAGNOSIS — F250 Schizoaffective disorder, bipolar type: Secondary | ICD-10-CM

## 2020-04-28 DIAGNOSIS — I480 Paroxysmal atrial fibrillation: Secondary | ICD-10-CM | POA: Diagnosis not present

## 2020-04-28 DIAGNOSIS — E538 Deficiency of other specified B group vitamins: Secondary | ICD-10-CM | POA: Diagnosis not present

## 2020-04-28 DIAGNOSIS — E1142 Type 2 diabetes mellitus with diabetic polyneuropathy: Secondary | ICD-10-CM | POA: Diagnosis not present

## 2020-04-28 DIAGNOSIS — E039 Hypothyroidism, unspecified: Secondary | ICD-10-CM

## 2020-04-28 DIAGNOSIS — I129 Hypertensive chronic kidney disease with stage 1 through stage 4 chronic kidney disease, or unspecified chronic kidney disease: Secondary | ICD-10-CM | POA: Diagnosis not present

## 2020-04-28 DIAGNOSIS — G6289 Other specified polyneuropathies: Secondary | ICD-10-CM

## 2020-04-28 MED ORDER — JANUVIA 25 MG PO TABS: 25.0000 mg | ORAL_TABLET | Freq: Every day | ORAL | 3 refills | Status: DC

## 2020-04-28 MED ORDER — ACETAMINOPHEN 500 MG PO TABS: 1000.0000 mg | ORAL_TABLET | Freq: Two times a day (BID) | ORAL | 0 refills | Status: DC

## 2020-04-28 NOTE — Patient Instructions (Signed)
-   check blood pressure daily and record
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
## 2020-04-29 ENCOUNTER — Telehealth: Payer: Self-pay | Admitting: Internal Medicine

## 2020-04-29 ENCOUNTER — Other Ambulatory Visit: Payer: Self-pay | Admitting: Adult Health

## 2020-04-29 ENCOUNTER — Telehealth: Payer: Self-pay | Admitting: Adult Health

## 2020-04-29 ENCOUNTER — Telehealth: Payer: Self-pay

## 2020-04-29 ENCOUNTER — Other Ambulatory Visit: Payer: Self-pay | Admitting: Family Medicine

## 2020-04-29 ENCOUNTER — Other Ambulatory Visit: Payer: Self-pay

## 2020-04-29 ENCOUNTER — Other Ambulatory Visit: Payer: Self-pay | Admitting: Family

## 2020-04-29 DIAGNOSIS — K047 Periapical abscess without sinus: Secondary | ICD-10-CM

## 2020-04-29 DIAGNOSIS — I480 Paroxysmal atrial fibrillation: Secondary | ICD-10-CM

## 2020-04-29 LAB — CBC WITH DIFFERENTIAL/PLATELET
Absolute Monocytes: 844 cells/uL (ref 200–950)
Basophils Absolute: 7 cells/uL (ref 0–200)
Basophils Relative: 0.1 %
Eosinophils Absolute: 147 cells/uL (ref 15–500)
Eosinophils Relative: 2.2 %
HCT: 30.7 % — ABNORMAL LOW (ref 35.0–45.0)
Hemoglobin: 10.7 g/dL — ABNORMAL LOW (ref 11.7–15.5)
Lymphs Abs: 2526 cells/uL (ref 850–3900)
MCH: 33.2 pg — ABNORMAL HIGH (ref 27.0–33.0)
MCHC: 34.9 g/dL (ref 32.0–36.0)
MCV: 95.3 fL (ref 80.0–100.0)
MPV: 11.3 fL (ref 7.5–12.5)
Monocytes Relative: 12.6 %
Neutro Abs: 3176 cells/uL (ref 1500–7800)
Neutrophils Relative %: 47.4 %
Platelets: 227 10*3/uL (ref 140–400)
RBC: 3.22 10*6/uL — ABNORMAL LOW (ref 3.80–5.10)
RDW: 14.5 % (ref 11.0–15.0)
Total Lymphocyte: 37.7 %
WBC: 6.7 10*3/uL (ref 3.8–10.8)

## 2020-04-29 LAB — COMPLETE METABOLIC PANEL WITH GFR
AG Ratio: 1.2 (calc) (ref 1.0–2.5)
ALT: 9 U/L (ref 6–29)
AST: 13 U/L (ref 10–35)
Albumin: 3.7 g/dL (ref 3.6–5.1)
Alkaline phosphatase (APISO): 78 U/L (ref 37–153)
BUN/Creatinine Ratio: 17 (calc) (ref 6–22)
BUN: 21 mg/dL (ref 7–25)
CO2: 31 mmol/L (ref 20–32)
Calcium: 9.4 mg/dL (ref 8.6–10.4)
Chloride: 96 mmol/L — ABNORMAL LOW (ref 98–110)
Creat: 1.26 mg/dL — ABNORMAL HIGH (ref 0.50–0.99)
GFR, Est African American: 51 mL/min/{1.73_m2} — ABNORMAL LOW (ref >=60)
GFR, Est Non African American: 44 mL/min/{1.73_m2} — ABNORMAL LOW (ref >=60)
Globulin: 3.2 g/dL (calc) (ref 1.9–3.7)
Glucose, Bld: 150 mg/dL — ABNORMAL HIGH (ref 65–139)
Potassium: 2.7 mmol/L — CL (ref 3.5–5.3)
Sodium: 136 mmol/L (ref 135–146)
Total Bilirubin: 0.2 mg/dL (ref 0.2–1.2)
Total Protein: 6.9 g/dL (ref 6.1–8.1)

## 2020-04-29 LAB — VALPROIC ACID LEVEL: Valproic Acid Lvl: 38 mg/L — ABNORMAL LOW (ref 50.0–100.0)

## 2020-04-29 LAB — VITAMIN B12: Vitamin B-12: 380 pg/mL (ref 200–1100)

## 2020-04-29 MED ORDER — POTASSIUM CHLORIDE CRYS ER 20 MEQ PO TBCR: 40.0000 meq | EXTENDED_RELEASE_TABLET | Freq: Two times a day (BID) | ORAL | 0 refills | Status: DC

## 2020-04-29 NOTE — Telephone Encounter (Signed)
Spoke with pts daughter and let her know the CT scan was cancelled as it was not approved by insurance. Pts OV rescheduled to 05/21/20@10 :30am per daughters request. Let her know we will call her back when we know more about rescheduling the CT scan.

## 2020-04-29 NOTE — Progress Notes (Signed)
[facility] called on-call provider regarding K+ 2.7

## 2020-04-29 NOTE — Telephone Encounter (Signed)
[facility] called on-call provider regarding K+2.7. I have ordered KCL ER 20 meq take 2 tabs = 40 meq BID X 1 day in addition to her current medication. Sent prescription to [facility] at [place].  Her K= needs to be re-checked on 04/30/20.

## 2020-04-29 NOTE — Progress Notes (Signed)
Script accidentally approved instead when request was send from pharmacy.previously discontinued by previous provider. CMA to call pharmacy to cancel script.

## 2020-04-29 NOTE — Telephone Encounter (Signed)
Patient established care yesterday and was examined in a professional way with daughter [name] present.she was very pleased with the care I provided yesterday and thank the provider.Patient has a right to choose which provider she wants to continue to see so just update [place] whether you will keep the appointment to recheck your critical potassium level.
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
## 2020-04-30 ENCOUNTER — Ambulatory Visit: Payer: Medicare (Managed Care)

## 2020-04-30 ENCOUNTER — Other Ambulatory Visit: Payer: Self-pay

## 2020-04-30 ENCOUNTER — Telehealth: Payer: Self-pay | Admitting: Orthopedic Surgery

## 2020-04-30 ENCOUNTER — Ambulatory Visit (HOSPITAL_COMMUNITY): Payer: Medicare (Managed Care)

## 2020-04-30 ENCOUNTER — Other Ambulatory Visit: Payer: Medicare (Managed Care)

## 2020-04-30 DIAGNOSIS — I480 Paroxysmal atrial fibrillation: Secondary | ICD-10-CM

## 2020-04-30 DIAGNOSIS — R42 Dizziness and giddiness: Secondary | ICD-10-CM

## 2020-04-30 DIAGNOSIS — D619 Aplastic anemia, unspecified: Secondary | ICD-10-CM

## 2020-04-30 DIAGNOSIS — E538 Deficiency of other specified B group vitamins: Secondary | ICD-10-CM

## 2020-04-30 NOTE — Telephone Encounter (Signed)
Patient called asked if she can get the gel injection again? Patient said she need an injection in her left knee, right arm. Patient said she will need an injection in her right knee and left arm the following week. Patient said her back is still the same. The number to contact patient is [phone]

## 2020-04-30 NOTE — Telephone Encounter (Signed)
Can we please obtain prior auth for bilateral knee gel injections OA bilat knees. She was approved for a right knee injection and Dr. Sharol Given gave in left knee on 11/14/19 (we dissolved the cost of that injection) and then was approved for the right knee and rec injection in right knee 11/21/19

## 2020-05-01 LAB — BASIC METABOLIC PANEL WITH GFR
BUN/Creatinine Ratio: 15 (calc) (ref 6–22)
BUN: 17 mg/dL (ref 7–25)
CO2: 32 mmol/L (ref 20–32)
Calcium: 8.9 mg/dL (ref 8.6–10.4)
Chloride: 99 mmol/L (ref 98–110)
Creat: 1.16 mg/dL — ABNORMAL HIGH (ref 0.50–0.99)
GFR, Est African American: 56 mL/min/{1.73_m2} — ABNORMAL LOW (ref >=60)
GFR, Est Non African American: 48 mL/min/{1.73_m2} — ABNORMAL LOW (ref >=60)
Glucose, Bld: 122 mg/dL (ref 65–139)
Potassium: 3.3 mmol/L — ABNORMAL LOW (ref 3.5–5.3)
Sodium: 139 mmol/L (ref 135–146)

## 2020-05-01 NOTE — Telephone Encounter (Signed)
Noted.  Will submit.  Patient will not be able to receive next bilateral gel injections until after 05/23/2020, if she wants both knees at the same time.

## 2020-05-01 NOTE — Telephone Encounter (Signed)
Pts CT rescheduled at [facility] for 05/12/20@2pm . Pt to arrive there at 1:45pm, be NPO after 10am, drink bottle 1 of contrast at 12, bottle 2 at 1pm. Pts daughter aware of appt and instructions. They will need to pick up contrast from [facility] xray prior to scan.

## 2020-05-04 ENCOUNTER — Telehealth: Payer: Self-pay

## 2020-05-04 NOTE — Telephone Encounter (Signed)
Pt called asking about CT scan and her new appt. Reviewed all the instructions that were discussed with her daughter and she confirmed understanding. Pt aware. See additional phone note for detailed instructions regarding appt.

## 2020-05-04 NOTE — Telephone Encounter (Signed)
Mailed [employer] application to patient's address listed for Monovisc, bilateral knee.

## 2020-05-05 ENCOUNTER — Ambulatory Visit: Payer: Medicare Other | Admitting: Orthopedic Surgery

## 2020-05-06 DIAGNOSIS — R0602 Shortness of breath: Secondary | ICD-10-CM | POA: Diagnosis not present

## 2020-05-06 DIAGNOSIS — R002 Palpitations: Secondary | ICD-10-CM | POA: Diagnosis not present

## 2020-05-06 DIAGNOSIS — I10 Essential (primary) hypertension: Secondary | ICD-10-CM | POA: Diagnosis not present

## 2020-05-06 DIAGNOSIS — R072 Precordial pain: Secondary | ICD-10-CM | POA: Diagnosis not present

## 2020-05-08 ENCOUNTER — Ambulatory Visit: Payer: Medicare (Managed Care) | Admitting: Internal Medicine

## 2020-05-09 ENCOUNTER — Other Ambulatory Visit: Payer: Self-pay | Admitting: Family

## 2020-05-09 ENCOUNTER — Other Ambulatory Visit: Payer: Self-pay | Admitting: Internal Medicine

## 2020-05-09 ENCOUNTER — Other Ambulatory Visit: Payer: Self-pay | Admitting: Family Medicine

## 2020-05-09 DIAGNOSIS — I10 Essential (primary) hypertension: Secondary | ICD-10-CM

## 2020-05-09 DIAGNOSIS — E1122 Type 2 diabetes mellitus with diabetic chronic kidney disease: Secondary | ICD-10-CM

## 2020-05-09 DIAGNOSIS — N183 Chronic kidney disease, stage 3 unspecified: Secondary | ICD-10-CM

## 2020-05-09 DIAGNOSIS — E1142 Type 2 diabetes mellitus with diabetic polyneuropathy: Secondary | ICD-10-CM

## 2020-05-09 DIAGNOSIS — E876 Hypokalemia: Secondary | ICD-10-CM

## 2020-05-09 DIAGNOSIS — K219 Gastro-esophageal reflux disease without esophagitis: Secondary | ICD-10-CM

## 2020-05-09 NOTE — Telephone Encounter (Signed)
Routing to PCP- [facility] does not refill for this PCP- Refused meds

## 2020-05-11 NOTE — Telephone Encounter (Signed)
Pharmacy requested refill.  Pended Rx and sent to [name] for approval due to HIGH ALERT Warning.  

## 2020-05-12 ENCOUNTER — Other Ambulatory Visit: Payer: Self-pay

## 2020-05-12 ENCOUNTER — Ambulatory Visit: Payer: Medicare (Managed Care)

## 2020-05-12 ENCOUNTER — Encounter (HOSPITAL_COMMUNITY): Payer: Self-pay

## 2020-05-12 ENCOUNTER — Ambulatory Visit (HOSPITAL_COMMUNITY)
Admission: RE | Admit: 2020-05-12 | Discharge: 2020-05-12 | Disposition: A | Discharge status: Home / Self Care | Payer: Medicare (Managed Care) | Source: Ambulatory Visit | Attending: Internal Medicine | Admitting: Internal Medicine

## 2020-05-12 ENCOUNTER — Encounter: Payer: Medicare (Managed Care) | Admitting: Podiatry

## 2020-05-12 ENCOUNTER — Ambulatory Visit: Payer: Medicare Other | Admitting: Orthopedic Surgery

## 2020-05-12 DIAGNOSIS — R109 Unspecified abdominal pain: Secondary | ICD-10-CM

## 2020-05-13 ENCOUNTER — Other Ambulatory Visit: Payer: Self-pay

## 2020-05-13 DIAGNOSIS — K219 Gastro-esophageal reflux disease without esophagitis: Secondary | ICD-10-CM

## 2020-05-13 MED ORDER — OMEPRAZOLE 40 MG PO CPDR: 40.0000 mg | DELAYED_RELEASE_CAPSULE | Freq: Two times a day (BID) | ORAL | 0 refills | Status: DC

## 2020-05-13 NOTE — Telephone Encounter (Signed)
Pt is requesting a call back from a nurse to reschedule her CT scan.

## 2020-05-13 NOTE — Telephone Encounter (Signed)
Spoke with pt and she was given the phone number [phone] to call and reschedule her CT appt.

## 2020-05-14 ENCOUNTER — Encounter (HOSPITAL_COMMUNITY): Payer: Self-pay

## 2020-05-14 ENCOUNTER — Ambulatory Visit (HOSPITAL_COMMUNITY)
Admission: RE | Admit: 2020-05-14 | Discharge: 2020-05-14 | Disposition: A | Discharge status: Home / Self Care | Payer: Medicare (Managed Care) | Source: Ambulatory Visit | Attending: Internal Medicine | Admitting: Internal Medicine

## 2020-05-14 ENCOUNTER — Ambulatory Visit: Payer: Medicare (Managed Care) | Admitting: Orthopedic Surgery

## 2020-05-14 ENCOUNTER — Other Ambulatory Visit: Payer: Self-pay

## 2020-05-14 DIAGNOSIS — R109 Unspecified abdominal pain: Secondary | ICD-10-CM | POA: Insufficient documentation

## 2020-05-14 MED ORDER — IOHEXOL 300 MG/ML  SOLN
100.0000 mL | Freq: Once | INTRAMUSCULAR | Status: AC | PRN
  Administered 2020-05-14: 100 mL via INTRAVENOUS

## 2020-05-15 ENCOUNTER — Telehealth: Payer: Self-pay

## 2020-05-15 MED ORDER — ACCU-CHEK SOFT TOUCH LANCETS MISC
1.0000 | Freq: Every day | 11 refills | Status: DC
Start: 2020-05-15 — End: 2020-05-26

## 2020-05-15 MED ORDER — ACCU-CHEK GUIDE ME W/DEVICE KIT
1.0000 [IU] | PACK | Freq: Every day | 0 refills | Status: DC
Start: 2020-05-15 — End: 2020-05-25

## 2020-05-15 MED ORDER — GLUCOSE BLOOD VI STRP
1.0000 | ORAL_STRIP | Freq: Every day | 11 refills | Status: DC
Start: 2020-05-15 — End: 2020-05-25

## 2020-05-15 MED ORDER — BLOOD PRESSURE MONITOR/WRIST KIT: 1.0000 [IU] | PACK | Freq: Every day | 0 refills | Status: DC

## 2020-05-15 NOTE — Addendum Note (Signed)
Addended by: Tanna Savoy on: 05/15/2020 01:10 PM   Modules accepted: Orders

## 2020-05-15 NOTE — Telephone Encounter (Signed)
Okay to send in Rx for b/p machine and glucometer with supplies Dx) HTN and type 2 diabetes

## 2020-05-15 NOTE — Telephone Encounter (Signed)
[name] Long called and said that she had a 1 touch diabetes machine that broke. She took it to the pharmacy to see if they could fix it, and they told her they could not, so she put it in the trash. She would like a new updated version because she stated she has great insurance. I asked her if she would like the freestyle libre or the dexcom along with the benefits of not having to stick herselff. She said no and she prefers to stick herself. She does not want the one-touch again so I see they have accu-chek machine, strips, and lancets. [name] Long also said she needs an order for a wrist B\P machine because she has a torn rotator cuff and it would be easier to monitor he B\P. Please advise.

## 2020-05-18 ENCOUNTER — Ambulatory Visit (INDEPENDENT_AMBULATORY_CARE_PROVIDER_SITE_OTHER): Payer: Medicare (Managed Care) | Admitting: Physician Assistant

## 2020-05-18 ENCOUNTER — Encounter: Payer: Self-pay | Admitting: Orthopedic Surgery

## 2020-05-18 DIAGNOSIS — M1712 Unilateral primary osteoarthritis, left knee: Secondary | ICD-10-CM

## 2020-05-18 MED ORDER — IBUPROFEN 800 MG PO TABS: 800.0000 mg | ORAL_TABLET | Freq: Three times a day (TID) | ORAL | 0 refills | Status: DC | PRN

## 2020-05-18 MED ORDER — LIDOCAINE HCL 1 % IJ SOLN
5.0000 mL | INTRAMUSCULAR | Status: AC | PRN
  Administered 2020-05-18: 5 mL

## 2020-05-18 MED ORDER — METHYLPREDNISOLONE ACETATE 40 MG/ML IJ SUSP
40.0000 mg | INTRAMUSCULAR | Status: AC | PRN
  Administered 2020-05-18: 40 mg via INTRA_ARTICULAR

## 2020-05-18 NOTE — Progress Notes (Signed)
This encounter was created in error - please disregard.

## 2020-05-18 NOTE — Progress Notes (Addendum)
Office Visit Note   Patient: [name] Long           Date of Birth: July 29, 1950           MRN: [id number] Visit Date: 05/18/2020              Requested by: Sandrea Hughs, NP [address] [place],  [place] [address] PCP: Sandrea Hughs, NP  Chief Complaint  Patient presents with  . Right Shoulder - Pain  . Left Shoulder - Pain      HPI: Patient is a 69 year old woman.  She presents today requesting an injection into her right shoulder and her left knee.  She is also sent in paperwork so she could be considered for Monovisc into her knee.  In the meantime she would like injections into both of these.  She is also asking for a refill of Motrin.  She has taken this instead of narcotics.  She does note that she has taken it without getting any significant swelling  Assessment & Plan: Visit Diagnoses: No diagnosis found.  Plan: Injections were given to her today we will call her when Monovisc has been approved  Follow-Up Instructions: No follow-ups on file.   Ortho Exam  Patient is alert, oriented, no adenopathy, well-dressed, normal affect, normal respiratory effort. Right shoulder positive impingement findings decreased range of motion.  No cellulitis or signs of infection  Left knee mild soft tissue swelling but no effusion or cellulitis tender over the patellofemoral joint and the medial joint line with varus malalignment  Imaging: No results found. No images are attached to the encounter.  Labs: Lab Results  Component Value Date   HGBA1C 10.3 (A) 03/23/2020   HGBA1C 12.0 (A) 02/03/2020   HGBA1C 12.2 (A) 10/30/2019   LABORGA NO GROWTH 12/04/2014     Lab Results  Component Value Date   ALBUMIN 3.3 (L) 04/03/2020   ALBUMIN 4.0 03/04/2020   ALBUMIN 3.4 (L) 09/08/2018    Lab Results  Component Value Date   MG 1.7 12/25/2016   MG 1.8 06/10/2016   MG 1.8 02/02/2016   Lab Results  Component Value Date   VD25OH 43.0 02/23/2018   VD25OH 30 05/06/2015    VD25OH 12 (L) 12/18/2014    No results found for: PREALBUMIN CBC EXTENDED Latest Ref Rng & Units 04/28/2020 04/03/2020 03/09/2020  WBC 3.8 - 10.8 Thousand/uL 6.7 6.7 8.0  RBC 3.80 - 5.10 Million/uL 3.22(L) 3.94 4.07  HGB 11.7 - 15.5 g/dL 10.7(L) 12.2 13.0  HCT 35 - 45 % 30.7(L) 36.8 39.6  PLT 140 - 400 Thousand/uL 227 243 188  NEUTROABS 1,500 - 7,800 cells/uL 3,176 - -  LYMPHSABS 850 - 3,900 cells/uL 2,526 - -     There is no height or weight on file to calculate BMI.  Orders:  No orders of the defined types were placed in this encounter.  Meds ordered this encounter  Medications  . ibuprofen (ADVIL) 800 MG tablet    Sig: Take 1 tablet (800 mg total) by mouth every 8 (eight) hours as needed.    Dispense:  30 tablet    Refill:  0     Procedures: Large Joint Inj on 05/18/2020 4:13 PM Indications: pain and diagnostic evaluation Details: 22 G 1.5 in needle, anteromedial approach  Arthrogram: No  Medications: 40 mg methylPREDNISolone acetate 40 MG/ML; 5 mL lidocaine 1 % Outcome: tolerated well, no immediate complications Procedure, treatment alternatives, risks and benefits explained, specific risks discussed.  Consent was given by the patient.      Clinical Data: No additional findings.  ROS:  All other systems negative, except as noted in the HPI. Review of Systems  Objective: Vital Signs: There were no vitals taken for this visit.  Specialty Comments:  No specialty comments available.  PMFS History: Patient Active Problem List   Diagnosis Date Noted  . Bipolar I disorder, most recent episode depressed, severe w psychosis ([place])   . Dementia associated with other underlying disease with behavioral disturbance ([place]) 03/27/2020  . GERD (gastroesophageal reflux disease) 12/27/2019  . Post-menopausal atrophic vaginitis 12/13/2019  . Vagina, candidiasis 12/10/2019  . Vaginal itching 11/10/2019  . Dysphagia 07/02/2019  . Screening examination for STD (sexually  transmitted disease) 03/25/2019  . 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 Other chronic pain 12/03/2018  . Dizziness of unknown cause 08/04/2018  . Tremor 07/15/2018  . Atrial flutter with rapid ventricular response ([place]) 07/10/2017  . Atrial fibrillation ([place]) 07/05/2017  . COPD (chronic obstructive pulmonary disease) ([place]) 01/22/2017  . Pedal edema   . Chronic bilateral low back pain without sciatica 08/15/2016  . 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 Schizoaffective disorder ([place])   . Hypokalemia 01/22/2015  . Osteoarthritis, multiple sites 06/08/2011  . Dysuria 02/14/2011  . CHRONIC KIDNEY DISEASE STAGE II ([facility]) 09/14/2009  . Hypothyroidism 08/31/2006  . Type 2 diabetes mellitus ([place]) 08/31/2006  . HYPERCHOLESTEROLEMIA 08/31/2006  . Tobacco abuse 08/31/2006  . HYPERTENSION, BENIGN SYSTEMIC 08/31/2006   Past Medical History:  Diagnosis Date  . Abscessed tooth 02/09/2020  . Acute on chronic congestive heart failure ([place])   . Adenomatous colon polyp   . Arthritis    "real bad; all over" (07/12/2017)  . Asthma   . Atrial fibrillation ([place])   . BIPOLAR DISORDER 08/31/2006   Qualifier: Diagnosis of  By: Dorathy Daft MD, Marjory Lies    . CHRONIC KIDNEY DISEASE STAGE II ([facility]) 09/14/2009   Annotation: eGFR 64 Qualifier: Diagnosis of  By: Jess Barters MD, [name]    . Chronic lower back pain   . Congestive heart failure (CHF) ([place])   . COPD (chronic obstructive pulmonary disease) ([place]) 09/23/2010   Diagnosed at [facility] in 2008 (Dr. Annamaria Boots)   . DM (diabetes mellitus) type II controlled with renal manifestation ([place]) 08/31/2006   Qualifier: Diagnosis of  By: Dorathy Daft MD, Marjory Lies    . Dyspnea    "all my life; since 6th grade" (07/12/2017)  . Fatty liver   . HYPERCHOLESTEROLEMIA 08/31/2006   Intolerance to Lipitor OK on Crestor but medicaid no longer covering    . HYPERTENSION, BENIGN SYSTEMIC 08/31/2006   Qualifier: Diagnosis of  By: Dorathy Daft MD, Marjory Lies     . Hypokalemia   . HYPOTHYROIDISM, UNSPECIFIED 08/31/2006   Qualifier: Diagnosis of  By: Dorathy Daft MD, Marjory Lies    . Leg swelling 03/14/2018  . Pneumonia    "3 times" (07/12/2017)  . Schizophrenia ([place])   . Scoliosis   . Stomach problems   . Thyroid disorder     Family History  Problem Relation Age of Onset  . Breast cancer Sister   . Heart disease Mother 54  . Rectal cancer Mother   . Diabetes Father 68  . Hypertension Father   . Heart disease Brother   . Asthma Daughter   . Asthma Son   . Breast cancer Sister   . Asthma Daughter   . Colon cancer Maternal Grandmother   . Stomach cancer Neg Hx   .  Esophageal cancer Neg Hx   . Pancreatic cancer Neg Hx     Past Surgical History:  Procedure Laterality Date  . CESAREAN SECTION    . FOOT FRACTURE SURGERY Right    "steel plate in it"  . FOOT SURGERY     " born w/dislocated foot"  . FRACTURE SURGERY    . KNEE ARTHROSCOPY Right   . TOE SURGERY Bilateral    "both pinky toes"  . TONSILLECTOMY AND ADENOIDECTOMY     Social History   Occupational History  . Not on file  Tobacco Use  . Smoking status: Current Some Day Smoker    Packs/day: 0.25    Years: 45.00    Pack years: 11.25    Types: Cigarettes  . Smokeless tobacco: Never Used  . Tobacco comment: patient states she smokes 1 cigarette per month  Vaping Use  . Vaping Use: Never used  Substance and Sexual Activity  . Alcohol use: Not Currently    Comment: 07/12/2017 "stopped 2 yr ago; did have a drink over the holidays recently"  . Drug use: No  . Sexual activity: Not Currently

## 2020-05-19 NOTE — Telephone Encounter (Signed)
RX sent to pharmacy  

## 2020-05-20 ENCOUNTER — Telehealth: Payer: Self-pay

## 2020-05-20 NOTE — Telephone Encounter (Signed)
Received [employer] application from patient. Faxed completed [employer] application to [phone] for Monovisc, bilateral knee.

## 2020-05-21 ENCOUNTER — Telehealth: Payer: Self-pay | Admitting: Internal Medicine

## 2020-05-21 ENCOUNTER — Ambulatory Visit: Payer: Medicare (Managed Care) | Admitting: Internal Medicine

## 2020-05-21 NOTE — Telephone Encounter (Signed)
Pts appt rescheduled to 06/08/20@10am  with Carl Best NP. Daughter aware of appt.

## 2020-05-21 NOTE — Telephone Encounter (Signed)
Attempted to call pt back but phone rings then goes directly to a busy signal.

## 2020-05-21 NOTE — Telephone Encounter (Signed)
Pt states she wants a copy of her CT scan mailed to her. Pt also rescheduled to see Dr. Hilarie Fredrickson 07/14/20@8 :50am.

## 2020-05-21 NOTE — Telephone Encounter (Signed)
Called pt back again, it rings and goes to a fast busy signal. If pt calls back please confirm the number she wants to be call back.

## 2020-05-21 NOTE — Telephone Encounter (Signed)
Pt called to update her phone number [phone]. Pls call her again.

## 2020-05-21 NOTE — Telephone Encounter (Signed)
Attempted to call pt, unable to reach.

## 2020-05-21 NOTE — Telephone Encounter (Signed)
Pt missed her appt with Dr. Hilarie Fredrickson this morning because her ride and daughter [name] had to pick up her daughter at school because she got sick. They are requesting a soon appt with Dr. Hilarie Fredrickson. He is fully booked. Pls call Cassandra.

## 2020-05-21 NOTE — Telephone Encounter (Signed)
Pt is requesting a call back from a nurse to call her back (missed call)

## 2020-05-21 NOTE — Telephone Encounter (Signed)
Patient is calling requesting we do not speak with her daughter and wanting to speak with Dr. Hilarie Fredrickson himself also mentioned CT results

## 2020-05-25 ENCOUNTER — Telehealth: Payer: Self-pay | Admitting: Family

## 2020-05-25 ENCOUNTER — Other Ambulatory Visit: Payer: Self-pay | Admitting: Family Medicine

## 2020-05-25 DIAGNOSIS — E1142 Type 2 diabetes mellitus with diabetic polyneuropathy: Secondary | ICD-10-CM

## 2020-05-25 MED ORDER — ACCU-CHEK GUIDE ME W/DEVICE KIT
PACK | 0 refills | Status: DC
Start: 2020-05-25 — End: 2020-05-26

## 2020-05-25 MED ORDER — GLUCOSE BLOOD VI STRP
ORAL_STRIP | 11 refills | Status: DC
Start: 2020-05-25 — End: 2020-05-26

## 2020-05-25 NOTE — Telephone Encounter (Signed)
Noted.May send glucometer and supplies once daughter verifies which glucometer is covered by [employer].

## 2020-05-25 NOTE — Telephone Encounter (Signed)
New Meter sent to Pharmacy.

## 2020-05-25 NOTE — Telephone Encounter (Signed)
Pt called concerned that she hasn't been able to check her sugar or blood pressure bc her home machines don't work correctly.  She wants rx orders for machines sent to [facility] at [address]  Thanks, Vilinda Blanks

## 2020-05-25 NOTE — Telephone Encounter (Signed)
[name], daughter  [phone] called and stated that she will call the insurance company and check on which blood sugar machine and blood pressure cuff they will cover and call us back so we can send in Rx.

## 2020-05-25 NOTE — Telephone Encounter (Signed)
Patient called back and stated that the meter called in, her insurance would not cover.  I explained to patient we sent a new meter for what she has gotten in the past since her's broke. She stated the insurance will not cover that machine.   I explained to patient that if she would call her insurance and ask what blood sugar machine they will cover and let us know then we would send over a new Rx for it to her pharmacy.   She stated that was not her job to do such thing and that the Dr. Is suppose to do that.  I explained to her that if we called on every patient to see what insurance will and wont cover then we wouldn't be able to provide the best patient care.  She stated she did not care and that it was not her job to follow up with her insurance and hung up on me.

## 2020-05-26 ENCOUNTER — Encounter: Payer: Self-pay | Admitting: Family

## 2020-05-26 ENCOUNTER — Other Ambulatory Visit: Payer: Self-pay

## 2020-05-26 ENCOUNTER — Ambulatory Visit (INDEPENDENT_AMBULATORY_CARE_PROVIDER_SITE_OTHER): Payer: Medicare (Managed Care) | Admitting: Family

## 2020-05-26 VITALS — BP 98/60 | HR 86 | Temp 97.5°F | Resp 16 | Ht 65.0 in | Wt 157.0 lb

## 2020-05-26 DIAGNOSIS — E039 Hypothyroidism, unspecified: Secondary | ICD-10-CM

## 2020-05-26 DIAGNOSIS — E1142 Type 2 diabetes mellitus with diabetic polyneuropathy: Secondary | ICD-10-CM

## 2020-05-26 DIAGNOSIS — R600 Localized edema: Secondary | ICD-10-CM

## 2020-05-26 DIAGNOSIS — I129 Hypertensive chronic kidney disease with stage 1 through stage 4 chronic kidney disease, or unspecified chronic kidney disease: Secondary | ICD-10-CM

## 2020-05-26 DIAGNOSIS — N952 Postmenopausal atrophic vaginitis: Secondary | ICD-10-CM | POA: Diagnosis not present

## 2020-05-26 DIAGNOSIS — J449 Chronic obstructive pulmonary disease, unspecified: Secondary | ICD-10-CM

## 2020-05-26 DIAGNOSIS — N183 Chronic kidney disease, stage 3 unspecified: Secondary | ICD-10-CM

## 2020-05-26 MED ORDER — BLOOD PRESSURE MONITOR/WRIST KIT: 1.0000 [IU] | PACK | Freq: Every day | 0 refills | Status: DC

## 2020-05-26 MED ORDER — FREESTYLE SYSTEM KIT: 1.0000 | PACK | Freq: Two times a day (BID) | 0 refills | Status: DC

## 2020-05-26 MED ORDER — FLUTICASONE-UMECLIDIN-VILANT 100-62.5-25 MCG/INH IN AEPB: 100.0000 ug | INHALATION_SPRAY | Freq: Every day | RESPIRATORY_TRACT | 0 refills | Status: DC

## 2020-05-26 MED ORDER — ESTRADIOL 0.1 MG/GM VA CREA: 1.0000 | TOPICAL_CREAM | Freq: Every day | VAGINAL | 0 refills | Status: DC

## 2020-05-26 MED ORDER — ACCU-CHEK SOFT TOUCH LANCETS MISC: 1.0000 | Freq: Every day | 11 refills | Status: DC

## 2020-05-26 MED ORDER — GLUCOSE BLOOD VI STRP: ORAL_STRIP | 11 refills | Status: DC

## 2020-05-26 MED ORDER — JANUVIA 25 MG PO TABS: 25.0000 mg | ORAL_TABLET | Freq: Every day | ORAL | 3 refills | Status: DC

## 2020-05-26 NOTE — Progress Notes (Signed)
Provider: Marlowe Sax FNP-C  Kearia Yin, Nelda Bucks, NP  Patient Care Team: Keyli Duross, Nelda Bucks, NP as PCP - General (Family Medicine) Newt Minion, MD as Consulting Physician (Orthopedic Surgery) Dixie Dials, MD as Consulting Physician (Cardiology)  Extended Emergency Contact Information Primary Emergency Contact: Shelda Pal Address: [address]          [place], [place] [address] [name] of [place] Phone: [phone] Mobile Phone: [phone] Relation: Daughter Secondary Emergency Contact: [place] Phone: [phone] Relation: Daughter  Code Status:  Full Code  Goals of care: Advanced Directive information Advanced Directives 05/26/2020  Does Patient Have a Medical Advance Directive? No  Type of Advance Directive -  Does patient want to make changes to medical advance directive? No - Patient declined  Copy of [place] in Chart? -  Would patient like information on creating a medical advance directive? -  Some encounter information is confidential and restricted. Go to Review Flowsheets activity to see all data.     Chief Complaint  Patient presents with  . Acute Visit    Patient wants to do full blood panel.    HPI:  Pt is a 69 y.o. female seen today for an acute visit for lab work and blood pressure follow up.she was here 04/28/2020 was supposed to follow up for B/p check but didn't .she states need all her lab work done to check potassium,zinc and everything.she is here with her daughter. Has not been checking her blood pressure at home B/p kit ordered last visit but unclear why she didn't get it. Also request another Glucometer to be ordered states her insurance does not cover previous ordered glucometer. She request compression stockings prescription states had some but when her grandchildren came to visit she gave them her compression stockings to wear to school. Her left leg has been more  swollen then the right.denies any pain or redness.    Past Medical History:  Diagnosis Date  . Abscessed tooth 02/09/2020  . Acute on chronic congestive heart failure ([place])   . Adenomatous colon polyp   . Arthritis    "real bad; all over" (07/12/2017)  . Asthma   . Atrial fibrillation ([place])   . BIPOLAR DISORDER 08/31/2006   Qualifier: Diagnosis of  By: Dorathy Daft MD, Marjory Lies    . CHRONIC KIDNEY DISEASE STAGE II (MILD) 09/14/2009   Annotation: eGFR 64 Qualifier: Diagnosis of  By: Jess Barters MD, [name]    . Chronic lower back pain   . Congestive heart failure (CHF) ([place])   . COPD (chronic obstructive pulmonary disease) ([place]) 09/23/2010   Diagnosed at [facility] in 2008 (Dr. Annamaria Boots)   . DM (diabetes mellitus) type II controlled with renal manifestation ([place]) 08/31/2006   Qualifier: Diagnosis of  By: Dorathy Daft MD, Marjory Lies    . Dyspnea    "all my life; since 6th grade" (07/12/2017)  . Fatty liver   . HYPERCHOLESTEROLEMIA 08/31/2006   Intolerance to Lipitor OK on Crestor but medicaid no longer covering    . HYPERTENSION, BENIGN SYSTEMIC 08/31/2006   Qualifier: Diagnosis of  By: Dorathy Daft MD, Marjory Lies    . Hypokalemia   . HYPOTHYROIDISM, UNSPECIFIED 08/31/2006   Qualifier: Diagnosis of  By: Dorathy Daft MD, Marjory Lies    . Leg swelling 03/14/2018  . Pneumonia    "3 times" (07/12/2017)  . Pulmonary nodule   . Renal cyst   . Schizophrenia ([place])   . Scoliosis   . Stomach problems   .  Thyroid disorder    Past Surgical History:  Procedure Laterality Date  . CESAREAN SECTION    . FOOT FRACTURE SURGERY Right    "steel plate in it"  . FOOT SURGERY     " born w/dislocated foot"  . FRACTURE SURGERY    . KNEE ARTHROSCOPY Right   . TOE SURGERY Bilateral    "both pinky toes"  . TONSILLECTOMY AND ADENOIDECTOMY      Allergies  Allergen Reactions  . Citrus Anaphylaxis and Itching  . Fish Allergy Anaphylaxis    Cod  . Shellfish Allergy Shortness Of Breath and Other (See Comments)    "Affects thyroid" also  .  Adhesive [Tape] Other (See Comments)    Must have paper tape only  . Ibuprofen Swelling    Face swells  . Latex Dermatitis  . Lipitor [Atorvastatin Calcium] Other (See Comments)    Body aches  . Lisinopril Other (See Comments)    PER DR. Melvyn Novas (not recalled by patient)  . Other Nausea Only and Other (See Comments)    Collards (gas, too)  . Codeine Rash  . Tramadol Palpitations    Outpatient Encounter Medications as of 05/26/2020  Medication Sig  . acetaminophen (TYLENOL) 500 MG tablet Take 2 tablets (1,000 mg total) by mouth in the morning and at bedtime. And 500 mg tablet daily at 2 pm  . amLODipine (NORVASC) 5 MG tablet Take 1 tablet (5 mg total) by mouth daily.  . Blood Glucose Monitoring Suppl (ACCU-CHEK GUIDE ME) w/Device KIT Use to test blood sugar once daily. Dx: E11.9  . Blood Pressure Monitoring (BLOOD PRESSURE MONITOR/WRIST) KIT 1 Units by Does not apply route daily at 6 (six) AM.  . divalproex (DEPAKOTE) 500 MG DR tablet Take 500 mg by mouth 2 (two) times daily.  Marland Kitchen estradiol (ESTRACE) 0.1 MG/GM vaginal cream Place 1 Applicatorful vaginally at bedtime.   . Fluticasone-Umeclidin-Vilant 100-62.5-25 MCG/INH AEPB Inhale 100 mcg into the lungs daily.  Marland Kitchen glucose blood test strip Use to test blood sugar once daily. Dx: E11.9  . hydrochlorothiazide (HYDRODIURIL) 25 MG tablet TAKE 1 TABLET(25 MG) BY MOUTH DAILY  . ibuprofen (ADVIL) 800 MG tablet Take 1 tablet (800 mg total) by mouth every 8 (eight) hours as needed.  Marland Kitchen JANUVIA 25 MG tablet Take 1 tablet (25 mg total) by mouth daily.  . Lancets (ACCU-CHEK SOFT TOUCH) lancets 1 each by Other route daily at 6 (six) AM. E11.9 accu-chek guide machine  . levothyroxine (SYNTHROID) 25 MCG tablet Take 1 tablet (25 mcg total) by mouth daily before breakfast.  . LORazepam (ATIVAN) 0.5 MG tablet Take 0.5 mg by mouth daily as needed.  . metFORMIN (GLUCOPHAGE) 1000 MG tablet TAKE 1 TABLET(1000 MG) BY MOUTH TWICE DAILY WITH A MEAL  . metoprolol  succinate (TOPROL-XL) 50 MG 24 hr tablet Take 1 tablet (50 mg total) by mouth daily. Take with or immediately following a meal.  . omeprazole (PRILOSEC) 40 MG capsule Take 1 capsule (40 mg total) by mouth 2 (two) times daily.  . potassium chloride (KLOR-CON) 10 MEQ tablet TAKE 1 TABLET(10 MEQ) BY MOUTH DAILY  . risperiDONE (RISPERDAL) 0.5 MG tablet Take 0.5 mg by mouth daily.  . risperiDONE (RISPERDAL) 0.5 MG tablet Take 1.5 mg by mouth at bedtime. Take 3 tablets to = 1.5 mg  . rosuvastatin (CRESTOR) 20 MG tablet TAKE 1 TABLET(20 MG) BY MOUTH DAILY  . sucralfate (CARAFATE) 1 GM/10ML suspension SHAKE LIQUID AND TAKE 10 ML(1 GRAM) BY MOUTH FOUR TIMES  DAILY  . potassium chloride SA (KLOR-CON M20) 20 MEQ tablet Take 2 tablets (40 mEq total) by mouth 2 (two) times daily for 1 day.  . [DISCONTINUED] albuterol (VENTOLIN HFA) 108 (90 Base) MCG/ACT inhaler INHALE 2 PUFFS EVERY 4 HOURS AS NEEDED FOR WHEEZING   Facility-Administered Encounter Medications as of 05/26/2020  Medication  . 0.9 %  sodium chloride infusion    Review of Systems  Constitutional: Negative for appetite change, chills, fatigue and fever.  HENT: Negative for congestion, rhinorrhea, sinus pressure, sinus pain, sneezing, sore throat and trouble swallowing.   Eyes: Negative for discharge, redness, itching and visual disturbance.  Respiratory: Negative for cough, chest tightness, shortness of breath and wheezing.   Cardiovascular: Positive for leg swelling. Negative for chest pain and palpitations.  Gastrointestinal: Negative for abdominal distention, abdominal pain, constipation, diarrhea, nausea and vomiting.  Endocrine: Negative for cold intolerance, heat intolerance, polydipsia, polyphagia and polyuria.  Genitourinary: Negative for difficulty urinating, dysuria, flank pain, frequency and urgency.  Musculoskeletal: Negative for arthralgias, back pain, gait problem, joint swelling and myalgias.  Skin: Negative for color change,  pallor and rash.  Neurological: Negative for dizziness, speech difficulty, weakness, light-headedness, numbness and headaches.  Hematological: Does not bruise/bleed easily.  Psychiatric/Behavioral: Negative for agitation, behavioral problems and sleep disturbance. The patient is not nervous/anxious.     Immunization History  Administered Date(s) Administered  . Influenza Split 05/17/2011, 04/27/2012  . Influenza Whole 07/11/2007, 06/30/2008, 06/02/2009, 04/22/2010  . Influenza,inj,Quad PF,6+ Mos 04/16/2013, 05/02/2014, 04/26/2017, 02/23/2018, 04/11/2019  . PFIZER SARS-COV-2 Vaccination 10/25/2019, 11/19/2019  . Pneumococcal Conjugate-13 04/26/2017  . Pneumococcal Polysaccharide-23 07/11/2007, 06/08/2011, 03/12/2020  . Td 08/04/1998  . Tdap 06/08/2011   Pertinent  Health Maintenance Due  Topic Date Due  . FOOT EXAM  04/26/2018  . MAMMOGRAM  02/17/2020  . INFLUENZA VACCINE  10/01/2020 (Originally 02/02/2020)  . OPHTHALMOLOGY EXAM  07/18/2020  . LIPID PANEL  09/09/2020  . HEMOGLOBIN A1C  09/20/2020  . URINE MICROALBUMIN  03/23/2021  . COLONOSCOPY  11/05/2022  . DEXA SCAN  Completed  . PNA vac Low Risk Adult  Completed   Fall Risk  05/26/2020 04/28/2020 03/23/2020 03/12/2020 02/12/2020  Falls in the past year? 0 1 0 1 0  Number falls in past yr: 0 0 0 0 0  Injury with Fall? 0 1 0 - 0  Follow up - - - Falls evaluation completed -   Functional Status Survey:    Vitals:   05/26/20 1444  BP: 98/60  Pulse: 86  Resp: 16  Temp: (!) 97.5 F (36.4 C)  SpO2: 96%  Weight: 157 lb (71.2 kg)  Height: '5\' 5"'  (1.651 m)   Body mass index is 26.13 kg/m. Physical Exam Vitals reviewed.  Constitutional:      General: She is not in acute distress.    Appearance: She is not ill-appearing.  HENT:     Head: Normocephalic.  Eyes:     General: No scleral icterus.       Right eye: No discharge.        Left eye: No discharge.     Extraocular Movements: Extraocular movements intact.      Conjunctiva/sclera: Conjunctivae normal.     Pupils: Pupils are equal, round, and reactive to light.  Neck:     Vascular: No carotid bruit.  Cardiovascular:     Rate and Rhythm: Normal rate and regular rhythm.     Pulses: Normal pulses.     Heart sounds: Normal heart sounds. No  murmur heard.  No friction rub. No gallop.   Pulmonary:     Effort: Pulmonary effort is normal. No respiratory distress.     Breath sounds: Normal breath sounds. No wheezing, rhonchi or rales.  Chest:     Chest wall: No tenderness.  Abdominal:     General: Bowel sounds are normal. There is no distension.     Palpations: Abdomen is soft. There is no mass.     Tenderness: There is no abdominal tenderness. There is no right CVA tenderness, left CVA tenderness, guarding or rebound.  Musculoskeletal:        General: No swelling or tenderness. Normal range of motion.     Cervical back: Normal range of motion. No rigidity or tenderness.     Right lower leg: Edema present.     Left lower leg: Edema present.     Comments: Trace -1+ edema to lower extremities.   Lymphadenopathy:     Cervical: No cervical adenopathy.  Skin:    General: Skin is warm and dry.     Coloration: Skin is not pale.     Findings: No bruising, erythema or rash.  Neurological:     Mental Status: She is alert and oriented to person, place, and time.     Sensory: No sensory deficit.     Motor: No weakness.     Coordination: Coordination normal.     Gait: Gait normal.  Psychiatric:        Speech: Speech normal.        Cognition and Memory: Memory is impaired.     Comments: Scored 24/30 on MMSE      Labs reviewed: Recent Labs    04/05/20 0756 04/28/20 1509 04/30/20 1206  NA 134* 136 139  K 3.3* 2.7* 3.3*  CL 94* 96* 99  CO2 28 31 32  GLUCOSE 135* 150* 122  BUN '15 21 17  ' CREATININE 1.49* 1.26* 1.16*  CALCIUM 9.5 9.4 8.9   Recent Labs    03/04/20 0956 04/03/20 1821 04/28/20 1509  AST '19 21 13  ' ALT '15 16 9  ' ALKPHOS 77 76   --   BILITOT 0.5 0.9 0.2  PROT 7.3 7.1 6.9  ALBUMIN 4.0 3.3*  --    Recent Labs    03/04/20 0956 03/04/20 0956 03/09/20 0244 04/03/20 1821 04/28/20 1509  WBC 8.9   < > 8.0 6.7 6.7  NEUTROABS 4.2  --   --   --  3,176  HGB 14.2   < > 13.0 12.2 10.7*  HCT 43.0   < > 39.6 36.8 30.7*  MCV 98.4   < > 97.3 93.4 95.3  PLT 213.0   < > 188 243 227   < > = values in this interval not displayed.   Lab Results  Component Value Date   TSH 0.951 10/30/2019   Lab Results  Component Value Date   HGBA1C 10.3 (A) 03/23/2020   Lab Results  Component Value Date   CHOL 148 03/12/2020   HDL 48 03/12/2020   LDLCALC 73 03/12/2020   LDLDIRECT 105 (H) 08/14/2012   TRIG 161 (H) 03/12/2020   CHOLHDL 3.1 03/12/2020    Significant Diagnostic Results in last 30 days:  CT Abdomen Pelvis W Contrast  Result Date: 05/15/2020 CLINICAL DATA:  Chronic abdominal pain. EXAM: CT ABDOMEN AND PELVIS WITH CONTRAST TECHNIQUE: Multidetector CT imaging of the abdomen and pelvis was performed using the standard protocol following bolus administration of intravenous contrast. CONTRAST:  179m  OMNIPAQUE IOHEXOL 300 MG/ML  SOLN COMPARISON:  None. FINDINGS: Lower chest: 3.5 mm subpleural pulmonary nodule at the left lung base is likely a benign lymph node. No pleural effusions or infiltrates. The heart is within normal limits in size. Coronary artery and aortic calcifications are noted. Hepatobiliary: Low-attenuation lesion in the central liver is most likely a benign cyst. No worrisome hepatic lesions or intrahepatic biliary dilatation. The gallbladder is unremarkable. No common bile duct dilatation. Pancreas: No mass, inflammation or ductal dilatation. Spleen: Normal size. No focal lesions. Adrenals/Urinary Tract: Adrenal the adrenal glands are normal. There are bilateral renal cysts. No worrisome renal lesions or hydronephrosis. The bladder is unremarkable. Stomach/Bowel: Stomach stomach, duodenum, small bowel and colon are  unremarkable. No acute inflammatory changes, mass lesions or obstructive findings. The terminal ileum is normal. The appendix is normal. Vascular/Lymphatic: Moderate atherosclerotic calcifications involving the abdominal aorta. There is a 3.0 cm infrarenal abdominal aortic aneurysm with moderate mural thrombus. This ends just above the bifurcation. Moderate to advanced iliac artery atherosclerotic calcifications but no aneurysm. No mesenteric or retroperitoneal mass or adenopathy. Reproductive: The uterus and ovaries are unremarkable. Moderate retroversion of the uterus. Other: No pelvic mass or adenopathy. No free pelvic fluid collections. No inguinal mass or adenopathy. No abdominal wall hernia or subcutaneous lesions. Musculoskeletal: Significant scoliosis and associated degenerative lumbar spondylosis but no acute bony findings or destructive bony changes. Mild bilateral hip joint degenerative changes. IMPRESSION: 1. No acute abdominal/pelvic findings, mass lesions or adenopathy. 2. 3.0 cm infrarenal abdominal aortic aneurysm. 3. Bilateral renal cysts. 4. Significant scoliosis and associated degenerative lumbar spondylosis. 5. 3.5 mm subpleural pulmonary nodule at the left lung base is likely a benign lymph node. No follow-up needed if patient is low-risk. Non-contrast chest CT can be considered in 12 months if patient is high-risk. This recommendation follows the consensus statement: Guidelines for Management of Incidental Pulmonary Nodules Detected on CT Images: From the Fleischner Society 2017; Radiology 2017; 284:228-243. 6. Aortic atherosclerosis. Aortic Atherosclerosis (ICD10-I70.0). Electronically Signed   By: Marijo Sanes M.D.   On: 05/15/2020 10:20    Assessment/Plan 1. DM type 2 with diabetic peripheral neuropathy (HCC) Lab Results  Component Value Date   HGBA1C 10.3 (A) 03/23/2020   No CBG for review.reports previous ordered glucometer not covered by insurance need another order.  Freestyle  glucose motor send to pharmacy but e-scribed failure noted after patient left.Will print script the fax to pharmacy in the morning. Advised to check CBG twice daily then notify provider if CBG > 200's.  - TSH - Lipid Panel - Hemoglobin A1c - JANUVIA 25 MG tablet; Take 1 tablet (25 mg total) by mouth daily.  Dispense: 30 tablet; Refill: 3 - glucose blood test strip; Use to test blood sugar once daily. Dx: E11.9  Dispense: 100 each; Refill: 11 - Lancets (ACCU-CHEK SOFT TOUCH) lancets; 1 each by Other route daily at 6 (six) AM. E11.9 accu-chek guide machine  Dispense: 100 each; Refill: 11 - glucose monitoring kit (FREESTYLE) monitoring kit; 1 each by Does not apply route 2 (two) times daily.  Dispense: 1 each; Refill: 0  2. Benign hypertension with CKD (chronic kidney disease) stage III (HCC) B/p well controlled.reports no symptoms of hypotension.Advised to check B/p once daily.blood pressure monitoring kit send to pharmacy but not pickup request another script. Script send to pharmacy and copy given to daughter to take to pharmacy. - continue on currenet medication.  - CBC with Differential/Platelet - CMP with eGFR([facility]) - Lipid Panel -  Blood Pressure Monitoring (BLOOD PRESSURE MONITOR/WRIST) KIT; 1 Units by Does not apply route daily at 6 (six) AM.  Dispense: 1 kit; Refill: 0  3. Hypothyroidism, unspecified type Lab Results  Component Value Date   TSH 0.951 10/30/2019  Continue on levothyroxine 25 mcg tablet daily  - TSH  4. Post-menopausal atrophic vaginitis Request refill for estradiol not clear whether did pickup last refill.  - estradiol (ESTRACE) 0.1 MG/GM vaginal cream; Place 1 Applicatorful vaginally at bedtime.  Dispense: 42.5 g; Refill: 0  5. Chronic obstructive pulmonary disease, unspecified COPD type ([place]) Breathing stable. - Fluticasone-Umeclidin-Vilant 100-62.5-25 MCG/INH AEPB; Inhale 100 mcg into the lungs daily.  Dispense: 60 each; Refill: 0  6. Edema of lower  extremity Trace - 1 + edema. Advised to wear knee high stockings on in the morning and off at bedtime.  - Compression stockings  Family/ staff Communication: Reviewed plan of care with patient and daughter.  Labs/tests ordered:  - TSH - Lipid Panel - Hemoglobin A1c - CBC with Differential/Platelet - CMP with eGFR([facility])  Next Appointment: 6 months for medical management of chronic issues   Sandrea Hughs, NP

## 2020-05-27 ENCOUNTER — Other Ambulatory Visit: Payer: Self-pay | Admitting: Nurse Practitioner

## 2020-05-27 ENCOUNTER — Other Ambulatory Visit: Payer: Self-pay

## 2020-05-27 ENCOUNTER — Ambulatory Visit
Admission: RE | Admit: 2020-05-27 | Discharge: 2020-05-27 | Disposition: A | Discharge status: Home / Self Care | Payer: Medicare (Managed Care) | Source: Ambulatory Visit | Attending: Family Medicine | Admitting: Family Medicine

## 2020-05-27 ENCOUNTER — Other Ambulatory Visit: Payer: Self-pay | Admitting: Family Medicine

## 2020-05-27 ENCOUNTER — Other Ambulatory Visit: Payer: Self-pay | Admitting: *Deleted

## 2020-05-27 DIAGNOSIS — Z1231 Encounter for screening mammogram for malignant neoplasm of breast: Secondary | ICD-10-CM

## 2020-05-27 DIAGNOSIS — N183 Chronic kidney disease, stage 3 unspecified: Secondary | ICD-10-CM

## 2020-05-27 DIAGNOSIS — K219 Gastro-esophageal reflux disease without esophagitis: Secondary | ICD-10-CM

## 2020-05-27 DIAGNOSIS — E1142 Type 2 diabetes mellitus with diabetic polyneuropathy: Secondary | ICD-10-CM

## 2020-05-27 DIAGNOSIS — E2839 Other primary ovarian failure: Secondary | ICD-10-CM

## 2020-05-27 DIAGNOSIS — I480 Paroxysmal atrial fibrillation: Secondary | ICD-10-CM

## 2020-05-27 DIAGNOSIS — I129 Hypertensive chronic kidney disease with stage 1 through stage 4 chronic kidney disease, or unspecified chronic kidney disease: Secondary | ICD-10-CM

## 2020-05-27 DIAGNOSIS — E538 Deficiency of other specified B group vitamins: Secondary | ICD-10-CM

## 2020-05-27 DIAGNOSIS — N182 Chronic kidney disease, stage 2 (mild): Secondary | ICD-10-CM

## 2020-05-27 DIAGNOSIS — E039 Hypothyroidism, unspecified: Secondary | ICD-10-CM

## 2020-05-27 LAB — COMPLETE METABOLIC PANEL WITH GFR
AG Ratio: 1.1 (calc) (ref 1.0–2.5)
ALT: 12 U/L (ref 6–29)
AST: 11 U/L (ref 10–35)
Albumin: 3.6 g/dL (ref 3.6–5.1)
Alkaline phosphatase (APISO): 89 U/L (ref 37–153)
BUN/Creatinine Ratio: 19 (calc) (ref 6–22)
BUN: 23 mg/dL (ref 7–25)
CO2: 34 mmol/L — ABNORMAL HIGH (ref 20–32)
Calcium: 9.5 mg/dL (ref 8.6–10.4)
Chloride: 96 mmol/L — ABNORMAL LOW (ref 98–110)
Creat: 1.2 mg/dL — ABNORMAL HIGH (ref 0.50–0.99)
GFR, Est African American: 54 mL/min/{1.73_m2} — ABNORMAL LOW (ref >=60)
GFR, Est Non African American: 46 mL/min/{1.73_m2} — ABNORMAL LOW (ref >=60)
Globulin: 3.4 g/dL (calc) (ref 1.9–3.7)
Glucose, Bld: 141 mg/dL — ABNORMAL HIGH (ref 65–139)
Potassium: 3.7 mmol/L (ref 3.5–5.3)
Sodium: 135 mmol/L (ref 135–146)
Total Bilirubin: 0.3 mg/dL (ref 0.2–1.2)
Total Protein: 7 g/dL (ref 6.1–8.1)

## 2020-05-27 LAB — CBC WITH DIFFERENTIAL/PLATELET
Absolute Monocytes: 1126 cells/uL — ABNORMAL HIGH (ref 200–950)
Basophils Absolute: 27 cells/uL (ref 0–200)
Basophils Relative: 0.4 %
Eosinophils Absolute: 147 cells/uL (ref 15–500)
Eosinophils Relative: 2.2 %
HCT: 34.1 % — ABNORMAL LOW (ref 35.0–45.0)
Hemoglobin: 11.4 g/dL — ABNORMAL LOW (ref 11.7–15.5)
Lymphs Abs: 2834 cells/uL (ref 850–3900)
MCH: 32.7 pg (ref 27.0–33.0)
MCHC: 33.4 g/dL (ref 32.0–36.0)
MCV: 97.7 fL (ref 80.0–100.0)
MPV: 11.1 fL (ref 7.5–12.5)
Monocytes Relative: 16.8 %
Neutro Abs: 2566 cells/uL (ref 1500–7800)
Neutrophils Relative %: 38.3 %
Platelets: 228 10*3/uL (ref 140–400)
RBC: 3.49 10*6/uL — ABNORMAL LOW (ref 3.80–5.10)
RDW: 16 % — ABNORMAL HIGH (ref 11.0–15.0)
Total Lymphocyte: 42.3 %
WBC: 6.7 10*3/uL (ref 3.8–10.8)

## 2020-05-27 LAB — LIPID PANEL
Cholesterol: 178 mg/dL (ref <200)
HDL: 60 mg/dL (ref >=50)
LDL Cholesterol (Calc): 98 mg/dL (calc)
Non-HDL Cholesterol (Calc): 118 mg/dL (calc) (ref <130)
Total CHOL/HDL Ratio: 3 (calc) (ref <5.0)
Triglycerides: 107 mg/dL (ref <150)

## 2020-05-27 LAB — HEMOGLOBIN A1C
Hgb A1c MFr Bld: 8.1 % of total Hgb — ABNORMAL HIGH (ref <5.7)
Mean Plasma Glucose: 186 (calc)
eAG (mmol/L): 10.3 (calc)

## 2020-05-27 LAB — TSH: TSH: 1.51 mIU/L (ref 0.40–4.50)

## 2020-05-27 MED ORDER — ONETOUCH VERIO VI STRP: ORAL_STRIP | 5 refills | Status: DC

## 2020-05-27 MED ORDER — ONETOUCH ULTRASOFT LANCETS MISC
5 refills | Status: DC
Start: 2020-05-27 — End: 2020-07-03

## 2020-05-27 MED ORDER — ONETOUCH VERIO W/DEVICE KIT
PACK | 0 refills | Status: DC
Start: 2020-05-27 — End: 2020-06-01

## 2020-05-27 NOTE — Telephone Encounter (Signed)
Daughter requested to be sent to pharmacy.

## 2020-06-01 ENCOUNTER — Other Ambulatory Visit: Payer: Self-pay | Admitting: Family

## 2020-06-01 DIAGNOSIS — R600 Localized edema: Secondary | ICD-10-CM

## 2020-06-01 DIAGNOSIS — N183 Chronic kidney disease, stage 3 unspecified: Secondary | ICD-10-CM

## 2020-06-01 DIAGNOSIS — I129 Hypertensive chronic kidney disease with stage 1 through stage 4 chronic kidney disease, or unspecified chronic kidney disease: Secondary | ICD-10-CM

## 2020-06-01 MED ORDER — ONETOUCH VERIO W/DEVICE KIT: PACK | 0 refills | Status: DC

## 2020-06-01 NOTE — Telephone Encounter (Signed)
Error

## 2020-06-02 ENCOUNTER — Ambulatory Visit: Payer: Medicare Other | Admitting: Family

## 2020-06-04 ENCOUNTER — Telehealth: Payer: Self-pay | Admitting: Orthopedic Surgery

## 2020-06-04 ENCOUNTER — Telehealth: Payer: Self-pay

## 2020-06-04 NOTE — Telephone Encounter (Signed)
Please see below.

## 2020-06-04 NOTE — Telephone Encounter (Signed)
Pt called stating she has been trying to schedule her gel injections for over 3 months and she keeps getting the run around. Pt states her knees are rubbing and she can't wait any longer because her pain levels are too great. Pt would like a CB from April today so she can get an explanation of what's going on.   [phone]

## 2020-06-04 NOTE — Telephone Encounter (Signed)
See previous message in chart.  

## 2020-06-04 NOTE — Telephone Encounter (Signed)
Patient called she would like a call back regarding her injection she stated she is in pain and she cant wait any longer for approval. CB:[phone]

## 2020-06-04 NOTE — Telephone Encounter (Signed)
Talked with patient concerning gel injection that is being purchased through [employer] patient assistance.  Advised patient that I will check the status of her application and give her a call back.  Patient voiced that she understands.

## 2020-06-08 ENCOUNTER — Ambulatory Visit: Payer: Medicare (Managed Care) | Admitting: Nurse Practitioner

## 2020-06-08 ENCOUNTER — Ambulatory Visit: Payer: Medicare (Managed Care) | Admitting: Podiatry

## 2020-06-09 ENCOUNTER — Telehealth: Payer: Self-pay

## 2020-06-09 NOTE — Telephone Encounter (Signed)
Please see message below. We are seeing for bilat knee pain and have not given rx for narcotics since June of this year please advise.

## 2020-06-09 NOTE — Telephone Encounter (Signed)
She will need to get through her pcp or pain management

## 2020-06-09 NOTE — Telephone Encounter (Signed)
patient called she stated she is still having pains , PER PCP she stated she is on buprofen is affecting her liver and kidneys and she needs to go on hydrocodone until the cold months are over with.[phone]

## 2020-06-10 ENCOUNTER — Telehealth: Payer: Self-pay | Admitting: Physician Assistant

## 2020-06-10 NOTE — Telephone Encounter (Signed)
Pt has an appt tomorrow °

## 2020-06-10 NOTE — Telephone Encounter (Signed)
Called and made an appt for tomorrow. Pt states that she is bed ridden and needs to be seen immediately

## 2020-06-10 NOTE — Telephone Encounter (Signed)
Pt called stating she is in severe pain all over her body; she states her R arm is in pain and popping so she would like a sling for that. Pt states Dr.Duda promised to send her one more rx of the prednisone. I did make pt aware of previous messages and she stated she already went to pain management. So pt would like a CB to discuss what can be done in regards to her pain  [phone]

## 2020-06-11 ENCOUNTER — Encounter: Payer: Self-pay | Admitting: Orthopedic Surgery

## 2020-06-11 ENCOUNTER — Ambulatory Visit (INDEPENDENT_AMBULATORY_CARE_PROVIDER_SITE_OTHER): Payer: Medicare (Managed Care) | Admitting: Orthopedic Surgery

## 2020-06-11 ENCOUNTER — Telehealth: Payer: Self-pay | Admitting: Orthopedic Surgery

## 2020-06-11 DIAGNOSIS — M545 Low back pain, unspecified: Secondary | ICD-10-CM

## 2020-06-11 DIAGNOSIS — M170 Bilateral primary osteoarthritis of knee: Secondary | ICD-10-CM

## 2020-06-11 DIAGNOSIS — M1712 Unilateral primary osteoarthritis, left knee: Secondary | ICD-10-CM

## 2020-06-11 DIAGNOSIS — G8929 Other chronic pain: Secondary | ICD-10-CM

## 2020-06-11 DIAGNOSIS — M25512 Pain in left shoulder: Secondary | ICD-10-CM | POA: Diagnosis not present

## 2020-06-11 MED ORDER — LIDOCAINE HCL 1 % IJ SOLN
5.0000 mL | INTRAMUSCULAR | Status: AC | PRN
  Administered 2020-06-11: 5 mL

## 2020-06-11 MED ORDER — HYDROCODONE-ACETAMINOPHEN 5-325 MG PO TABS: 1.0000 | ORAL_TABLET | ORAL | 0 refills | Status: DC | PRN

## 2020-06-11 MED ORDER — METHYLPREDNISOLONE ACETATE 40 MG/ML IJ SUSP
40.0000 mg | INTRAMUSCULAR | Status: AC | PRN
  Administered 2020-06-11: 40 mg via INTRA_ARTICULAR

## 2020-06-11 NOTE — Telephone Encounter (Signed)
Called and left a VM for patient concerning gel injection.

## 2020-06-11 NOTE — Progress Notes (Signed)
Office Visit Note   Patient: [name] Long           Date of Birth: February 27, 1951           MRN: [id number] Visit Date: 06/11/2020              Requested by: Sandrea Hughs, NP [address] [place],  [place] [address] PCP: Sandrea Hughs, NP  Chief Complaint  Patient presents with  . Left Knee - Pain  . Right Knee - Pain      HPI: Patient is a 69 year old woman who presents she is status post right shoulder and left knee injection. Patient states she wears the neoprene sleeves for her knees complains of swelling in her ankle. She states that she has liver and kidney disease in the ibuprofen while it helps her she was told not to use it due to her liver and kidney disease. She states she has severe pain all over her body.  Assessment & Plan: Visit Diagnoses:  1. Unilateral primary osteoarthritis, left knee   2. Chronic bilateral low back pain without sciatica   3. Bilateral primary osteoarthritis of knee   4. Chronic left shoulder pain     Plan: We will request evaluation for pain clinic for long-term pain management. The left shoulder was injected.  Follow-Up Instructions: Return if symptoms worsen or fail to improve.   Ortho Exam  Patient is alert, oriented, no adenopathy, well-dressed, normal affect, normal respiratory effort. Examination patient has difficulty getting from a sitting to a standing position. She has abduction flexion of the left shoulder to 90 degrees she has pain with Neer and Hawkins impingement test. She does have swelling of both lower extremities. Recommend that she use her knee-high compression stockings.  Imaging: No results found. No images are attached to the encounter.  Labs: Lab Results  Component Value Date   HGBA1C 8.1 (H) 05/26/2020   HGBA1C 10.3 (A) 03/23/2020   HGBA1C 12.0 (A) 02/03/2020   LABORGA NO GROWTH 12/04/2014     Lab Results  Component Value Date   ALBUMIN 3.3 (L) 04/03/2020   ALBUMIN 4.0 03/04/2020   ALBUMIN 3.4  (L) 09/08/2018    Lab Results  Component Value Date   MG 1.7 12/25/2016   MG 1.8 06/10/2016   MG 1.8 02/02/2016   Lab Results  Component Value Date   VD25OH 43.0 02/23/2018   VD25OH 30 05/06/2015   VD25OH 12 (L) 12/18/2014    No results found for: PREALBUMIN CBC EXTENDED Latest Ref Rng & Units 05/26/2020 04/28/2020 04/03/2020  WBC 3.8 - 10.8 Thousand/uL 6.7 6.7 6.7  RBC 3.80 - 5.10 Million/uL 3.49(L) 3.22(L) 3.94  HGB 11.7 - 15.5 g/dL 11.4(L) 10.7(L) 12.2  HCT 35.0 - 45.0 % 34.1(L) 30.7(L) 36.8  PLT 140 - 400 Thousand/uL 228 227 243  NEUTROABS 1,500 - 7,800 cells/uL 2,566 3,176 -  LYMPHSABS 850 - 3,900 cells/uL 2,834 2,526 -     There is no height or weight on file to calculate BMI.  Orders:  Orders Placed This Encounter  Procedures  . Ambulatory referral to Pain Clinic   Meds ordered this encounter  Medications  . HYDROcodone-acetaminophen (NORCO/VICODIN) 5-325 MG tablet    Sig: Take 1 tablet by mouth every 4 (four) hours as needed for moderate pain.    Dispense:  30 tablet    Refill:  0     Procedures: Large Joint Inj: L subacromial bursa on 06/11/2020 2:31 PM Indications: diagnostic evaluation  and pain Details: 22 G 1.5 in needle  Arthrogram: No  Medications: 5 mL lidocaine 1 %; 40 mg methylPREDNISolone acetate 40 MG/ML Outcome: tolerated well, no immediate complications Procedure, treatment alternatives, risks and benefits explained, specific risks discussed. Consent was given by the patient. Immediately prior to procedure a time out was called to verify the correct patient, procedure, equipment, support staff and site/side marked as required. Patient was prepped and draped in the usual sterile fashion.      Clinical Data: No additional findings.  ROS:  All other systems negative, except as noted in the HPI. Review of Systems  Objective: Vital Signs: There were no vitals taken for this visit.  Specialty Comments:  No specialty comments  available.  PMFS History: Patient Active Problem List   Diagnosis Date Noted  . Bipolar I disorder, most recent episode depressed, severe w psychosis ([place])   . Dementia associated with other underlying disease with behavioral disturbance ([place]) 03/27/2020  . GERD (gastroesophageal reflux disease) 12/27/2019  . Post-menopausal atrophic vaginitis 12/13/2019  . Vagina, candidiasis 12/10/2019  . Vaginal itching 11/10/2019  . Dysphagia 07/02/2019  . Screening examination for STD (sexually transmitted disease) 03/25/2019  . 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 Other chronic pain 12/03/2018  . Dizziness of unknown cause 08/04/2018  . Tremor 07/15/2018  . Atrial flutter with rapid ventricular response ([place]) 07/10/2017  . Atrial fibrillation ([place]) 07/05/2017  . COPD (chronic obstructive pulmonary disease) ([place]) 01/22/2017  . Pedal edema   . Chronic bilateral low back pain without sciatica 08/15/2016  . 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 Schizoaffective disorder ([place])   . Hypokalemia 01/22/2015  . Osteoarthritis, multiple sites 06/08/2011  . Dysuria 02/14/2011  . CHRONIC KIDNEY DISEASE STAGE II (MILD) 09/14/2009  . Hypothyroidism 08/31/2006  . Type 2 diabetes mellitus ([place]) 08/31/2006  . HYPERCHOLESTEROLEMIA 08/31/2006  . Tobacco abuse 08/31/2006  . HYPERTENSION, BENIGN SYSTEMIC 08/31/2006   Past Medical History:  Diagnosis Date  . Abscessed tooth 02/09/2020  . Acute on chronic congestive heart failure ([place])   . Adenomatous colon polyp   . Arthritis    "real bad; all over" (07/12/2017)  . Asthma   . Atrial fibrillation ([place])   . BIPOLAR DISORDER 08/31/2006   Qualifier: Diagnosis of  By: Dorathy Daft MD, Marjory Lies    . CHRONIC KIDNEY DISEASE STAGE II (MILD) 09/14/2009   Annotation: eGFR 64 Qualifier: Diagnosis of  By: Jess Barters MD, [name]    . Chronic lower back pain   . Congestive heart failure (CHF) ([place])   . COPD (chronic obstructive pulmonary  disease) ([place]) 09/23/2010   Diagnosed at [facility] in 2008 (Dr. Annamaria Boots)   . DM (diabetes mellitus) type II controlled with renal manifestation ([place]) 08/31/2006   Qualifier: Diagnosis of  By: Dorathy Daft MD, Marjory Lies    . Dyspnea    "all my life; since 6th grade" (07/12/2017)  . Fatty liver   . HYPERCHOLESTEROLEMIA 08/31/2006   Intolerance to Lipitor OK on Crestor but medicaid no longer covering    . HYPERTENSION, BENIGN SYSTEMIC 08/31/2006   Qualifier: Diagnosis of  By: Dorathy Daft MD, Marjory Lies    . Hypokalemia   . HYPOTHYROIDISM, UNSPECIFIED 08/31/2006   Qualifier: Diagnosis of  By: Dorathy Daft MD, Marjory Lies    . Leg swelling 03/14/2018  . Pneumonia    "3 times" (07/12/2017)  . Pulmonary nodule   . Renal cyst   . Schizophrenia ([place])   . Scoliosis   . Stomach problems   .  Thyroid disorder     Family History  Problem Relation Age of Onset  . Breast cancer Sister   . Heart disease Mother 48  . Rectal cancer Mother   . Diabetes Father 78  . Hypertension Father   . Heart disease Brother   . Asthma Daughter   . Asthma Son   . Breast cancer Sister   . Asthma Daughter   . Colon cancer Maternal Grandmother   . Stomach cancer Neg Hx   . Esophageal cancer Neg Hx   . Pancreatic cancer Neg Hx     Past Surgical History:  Procedure Laterality Date  . CESAREAN SECTION    . FOOT FRACTURE SURGERY Right    "steel plate in it"  . FOOT SURGERY     " born w/dislocated foot"  . FRACTURE SURGERY    . KNEE ARTHROSCOPY Right   . TOE SURGERY Bilateral    "both pinky toes"  . TONSILLECTOMY AND ADENOIDECTOMY     Social History   Occupational History  . Not on file  Tobacco Use  . Smoking status: Current Some Day Smoker    Packs/day: 0.25    Years: 45.00    Pack years: 11.25    Types: Cigarettes  . Smokeless tobacco: Never Used  . Tobacco comment: patient states she smokes 1 cigarette per month  Vaping Use  . Vaping Use: Never used  Substance and Sexual Activity  . Alcohol use: Not Currently     Comment: 07/12/2017 "stopped 2 yr ago; did have a drink over the holidays recently"  . Drug use: No  . Sexual activity: Not Currently

## 2020-06-11 NOTE — Telephone Encounter (Signed)
Pt states per her last conversation with April the gel injections was backed up but pt would like to know how far they are backed up; pt would like a CB to update her please  [phone]

## 2020-06-16 ENCOUNTER — Other Ambulatory Visit: Payer: Self-pay | Admitting: Family Medicine

## 2020-06-18 ENCOUNTER — Other Ambulatory Visit: Payer: Self-pay | Admitting: Family Medicine

## 2020-06-24 ENCOUNTER — Telehealth: Payer: Self-pay | Admitting: Internal Medicine

## 2020-06-24 ENCOUNTER — Telehealth: Payer: Self-pay | Admitting: Orthopedic Surgery

## 2020-06-24 DIAGNOSIS — K219 Gastro-esophageal reflux disease without esophagitis: Secondary | ICD-10-CM

## 2020-06-24 NOTE — Telephone Encounter (Signed)
Pt is requesting a refill on her omeprazole 40mg  and sucralfate.  [place]

## 2020-06-24 NOTE — Telephone Encounter (Signed)
Pt called and said she got a letter in the mail saying she was approved for gel injections. Please call her at [phone]

## 2020-06-24 NOTE — Telephone Encounter (Signed)
Please see below. Do we wait to sch after the new year?

## 2020-06-25 MED ORDER — SUCRALFATE 1 GM/10ML PO SUSP: ORAL | 0 refills | Status: DC

## 2020-06-25 MED ORDER — OMEPRAZOLE 40 MG PO CPDR: DELAYED_RELEASE_CAPSULE | ORAL | 0 refills | Status: DC

## 2020-06-25 NOTE — Telephone Encounter (Signed)
Rx sent until 07/2020 appointment with Dr Hilarie Fredrickson.

## 2020-06-30 ENCOUNTER — Other Ambulatory Visit: Payer: Self-pay | Admitting: Family

## 2020-06-30 ENCOUNTER — Telehealth: Payer: Self-pay | Admitting: Orthopedic Surgery

## 2020-06-30 ENCOUNTER — Other Ambulatory Visit: Payer: Self-pay | Admitting: Family Medicine

## 2020-06-30 NOTE — Telephone Encounter (Signed)
Yes,please.  [employer] has to send [place] the medication and I haven't received the approval letter yet.

## 2020-06-30 NOTE — Telephone Encounter (Signed)
CPR called. They called the patient to schedule appointment from referral sent on 12/8. Patient was rude with them and told them not to call her again. I tried to call her to make sure she did not want an appointment with them, same thing happened. She hung the pone up.

## 2020-06-30 NOTE — Telephone Encounter (Signed)
In November 2021 a Onetouch meter and supplies was sent to patients pharmacy

## 2020-06-30 NOTE — Telephone Encounter (Signed)
Noted pt declined appt for pain management

## 2020-07-03 ENCOUNTER — Other Ambulatory Visit: Payer: Self-pay | Admitting: Family

## 2020-07-03 DIAGNOSIS — N183 Chronic kidney disease, stage 3 unspecified: Secondary | ICD-10-CM

## 2020-07-03 DIAGNOSIS — E1142 Type 2 diabetes mellitus with diabetic polyneuropathy: Secondary | ICD-10-CM

## 2020-07-03 DIAGNOSIS — I129 Hypertensive chronic kidney disease with stage 1 through stage 4 chronic kidney disease, or unspecified chronic kidney disease: Secondary | ICD-10-CM

## 2020-07-03 MED ORDER — AMLODIPINE BESYLATE 5 MG PO TABS: 5.0000 mg | ORAL_TABLET | Freq: Every day | ORAL | 3 refills | Status: DC

## 2020-07-03 MED ORDER — ONETOUCH ULTRASOFT LANCETS MISC: 11 refills | Status: DC

## 2020-07-03 MED ORDER — ONETOUCH VERIO VI STRP: ORAL_STRIP | 11 refills | Status: DC

## 2020-07-03 NOTE — Progress Notes (Signed)
Patient called on call provider request Amlodipine,Lancet and Test Strips refill to be send to [facility] on [address].Script send.

## 2020-07-07 ENCOUNTER — Telehealth: Payer: Self-pay | Admitting: Orthopedic Surgery

## 2020-07-07 NOTE — Telephone Encounter (Signed)
PT called and she wants to schedule her gel injection since she has been approved. Please call her [phone]

## 2020-07-07 NOTE — Telephone Encounter (Signed)
Correct. I talked with patient and advised her that the medication has to be shipped to our office before an appointment can be made.  Patient voiced that she understands.  Patient would like to know if she can get a sling for her right arm?  Please advise.  Thank you

## 2020-07-07 NOTE — Telephone Encounter (Signed)
Can you please call pt? We still do not have the shipment correct?

## 2020-07-08 ENCOUNTER — Telehealth: Payer: Self-pay | Admitting: Orthopedic Surgery

## 2020-07-08 NOTE — Telephone Encounter (Signed)
Pt is very argumentative I advised that we have not treated her shoulder pain since / and that if she was having pain should come in for eval. Do not know that a sling would be appropriate. I did not get to speak after that point. The pt advised that I did not know what I was talking about that she has been wearing a sling since 2008 and that she is having pain all over her body and that she would not be in so much pain if we knew what we were doing and didn't lose her approval letter from [employer]. ( this is gel injection approval through patient assistance and the product has to be shipped to the office and we have not received it yet pt has been advised of this multiple times) Patient states that I am not to call her anymore until I have talked with Dr. Lajoyce Corners and explained that she needs Hydrocodone and it has been approved. That's the only she wants a call back from the office and then hung up the phone.

## 2020-07-08 NOTE — Telephone Encounter (Signed)
Patient called asking for a call back from Dr. Lajoyce Corners. She would like a refill of hydrocodone. Please send to pharmacy on file. Also patient is asking for a sling arm. Patient states she don't want to deal with the assistance or Triage nurse. She states she can pick up the sling for her arm and to please call in refill of pain medication. Patient phone number is [phone].

## 2020-07-09 ENCOUNTER — Telehealth: Payer: Self-pay | Admitting: Orthopedic Surgery

## 2020-07-09 NOTE — Telephone Encounter (Signed)
I called pt and pt she sates " I told you not to call my house anymore. I want Dr. Lajoyce Corners to call me and give me what I asked for. You are an asshole and F you." I advised the pt that It would be best if we could make an appt for her to come in and see Dr. Lajoyce Corners face to face so that they can discuss treatment and she said that she will not come in the office that she knows what she needs and not to call her any more.

## 2020-07-09 NOTE — Telephone Encounter (Signed)
See previous message

## 2020-07-09 NOTE — Telephone Encounter (Signed)
Patient called needing Rx refilled Ibuprofen 800mg . The number to contact patient is [phone]  Patient also asked if she can get a sling for her right arm? Patient said she do not have an aid anymore. The aid left in April 2021.  [phone]

## 2020-07-13 ENCOUNTER — Telehealth: Payer: Self-pay | Admitting: Orthopedic Surgery

## 2020-07-13 NOTE — Telephone Encounter (Signed)
PT wants to know if she can get a sling for her right arm and only wants it if its covered by his insurance. Please call her at [phone]

## 2020-07-13 NOTE — Telephone Encounter (Signed)
Multiple phone calls with this pt. Had advised needs OV to discuss treatment for shoulder. She just wants a sling but only if insurance will cover for shoulder pain. Do you know if this is a covered item?

## 2020-07-13 NOTE — Telephone Encounter (Signed)
It should be a covered item but no way of knowing for certain. I did message Thurmond Butts with [employer] to ask him specifically since our DME is now outsourced through them.

## 2020-07-14 ENCOUNTER — Ambulatory Visit: Payer: Medicare (Managed Care) | Admitting: Internal Medicine

## 2020-07-14 NOTE — Telephone Encounter (Signed)
IC talked with patient at length. After speaking with her she advised that it was actually both shoulder, stated she was having a lot of pain and difficulty using her shoulders.  I advised her likely better for her to see Dr Sharol Given since she requested bilat arm slings and bilat shoulder injections.  I scheduled her to see Dr Sharol Given for her shoulders on 01/20.  Patient verbalized understanding.

## 2020-07-14 NOTE — Telephone Encounter (Signed)
Follow up from last night. Can you please call pt and advise she can pick up from pharmacy or medical supply store. Thank you . This pt is very verbally abusive. See previous notes.

## 2020-07-16 DIAGNOSIS — F250 Schizoaffective disorder, bipolar type: Secondary | ICD-10-CM | POA: Diagnosis not present

## 2020-07-22 ENCOUNTER — Telehealth: Payer: Self-pay | Admitting: Orthopedic Surgery

## 2020-07-22 NOTE — Telephone Encounter (Signed)
Patient called requesting refill of ibuprofen 800 mg and prednisone.. Patient might be unable to come to appt tomorrow due to weather conditions and is also asking for a script for right arm sling.. Please call patient when medications has been called in and please send to pharmacy on file. Patient phone number is [phone]. Patient still wants to keep appt just incase she will be able to make it.

## 2020-07-22 NOTE — Telephone Encounter (Signed)
Can you please call this pt. She has been advised multiple times that we can not give rx. Her appt tomorrow was to discuss treatment and she will have to be seen if not tomorrow at her next available time. Thanks

## 2020-07-23 ENCOUNTER — Ambulatory Visit: Payer: Medicare Other | Admitting: Orthopedic Surgery

## 2020-07-23 NOTE — Telephone Encounter (Signed)
Tried calling. No answer.

## 2020-07-24 NOTE — Telephone Encounter (Signed)
Tried patient again. No answer.  

## 2020-07-25 ENCOUNTER — Other Ambulatory Visit: Payer: Self-pay | Admitting: Specialist

## 2020-07-25 MED ORDER — METHYLPREDNISOLONE 4 MG PO TABS: 2.0000 mg | ORAL_TABLET | Freq: Every day | ORAL | 0 refills | Status: DC

## 2020-07-25 MED ORDER — IBUPROFEN 800 MG PO TABS
800.0000 mg | ORAL_TABLET | Freq: Three times a day (TID) | ORAL | 0 refills | Status: DC | PRN
Start: 2020-07-25 — End: 2020-08-13

## 2020-07-29 ENCOUNTER — Telehealth: Payer: Self-pay | Admitting: Orthopedic Surgery

## 2020-07-29 ENCOUNTER — Telehealth: Payer: Self-pay

## 2020-07-29 NOTE — Telephone Encounter (Signed)
Patient called advised she need to get the gel injections in both needs. Patient advised she have not received her Rx for Prednisone yet. The number to contact patient is [phone]

## 2020-07-29 NOTE — Telephone Encounter (Signed)
Just wanted to check on the gel injections for the pt. She had her left knee injection with monovisc 11/14/19 ( even though she only had auth for the right knee we absorbed the cost of that injection) and she had the right knee injected on 11/21/19 so she can have both knees done now and pt wants auth for this. Do you know where they are in the process of financial assistance for thsis pt?

## 2020-07-29 NOTE — Telephone Encounter (Signed)
Received approval letter and PRF form from [employer] patient assistance for Monovisc, bilateral knee. Faxed completed PRF form to [employer] at [phone].

## 2020-07-29 NOTE — Telephone Encounter (Signed)
She has been approved for bilateral gel injection through [employer] patient assistance, but I am currently waiting on PRF form to be faxed so I can complete and fax back to receive the gel injection.  Once I receive the gel injection I will call her to schedule.

## 2020-07-30 NOTE — Telephone Encounter (Signed)
Called and left a VM advising patient that I am currently waiting for [employer] to send the gel injection to our office and once we receive it, I will call to schedule appointment.

## 2020-07-30 NOTE — Telephone Encounter (Signed)
Can you give her a call and let her know this please?

## 2020-08-03 DIAGNOSIS — R072 Precordial pain: Secondary | ICD-10-CM | POA: Diagnosis not present

## 2020-08-03 DIAGNOSIS — I425 Other restrictive cardiomyopathy: Secondary | ICD-10-CM | POA: Diagnosis not present

## 2020-08-03 DIAGNOSIS — R0602 Shortness of breath: Secondary | ICD-10-CM | POA: Diagnosis not present

## 2020-08-04 ENCOUNTER — Telehealth: Payer: Self-pay | Admitting: Orthopedic Surgery

## 2020-08-04 NOTE — Telephone Encounter (Signed)
Pt has an appt on 08/13/20 and can discuss this with Dr. Sharol Given at that time. This has been advised multiple times can you please call for me? Pt is very abrasive and  volatile

## 2020-08-04 NOTE — Telephone Encounter (Signed)
Patient called requesting a refill of presnisone and 800 ibuprofen. Please send to pharmacy on file. Patient phone number is [phone].

## 2020-08-04 NOTE — Telephone Encounter (Signed)
IC s/w patient. She verbalized understanding.

## 2020-08-06 ENCOUNTER — Other Ambulatory Visit: Payer: Self-pay | Admitting: Family

## 2020-08-06 DIAGNOSIS — N952 Postmenopausal atrophic vaginitis: Secondary | ICD-10-CM

## 2020-08-06 DIAGNOSIS — I129 Hypertensive chronic kidney disease with stage 1 through stage 4 chronic kidney disease, or unspecified chronic kidney disease: Secondary | ICD-10-CM

## 2020-08-06 DIAGNOSIS — E1122 Type 2 diabetes mellitus with diabetic chronic kidney disease: Secondary | ICD-10-CM

## 2020-08-06 DIAGNOSIS — J449 Chronic obstructive pulmonary disease, unspecified: Secondary | ICD-10-CM

## 2020-08-06 DIAGNOSIS — N183 Chronic kidney disease, stage 3 unspecified: Secondary | ICD-10-CM

## 2020-08-06 DIAGNOSIS — E1142 Type 2 diabetes mellitus with diabetic polyneuropathy: Secondary | ICD-10-CM

## 2020-08-06 DIAGNOSIS — K219 Gastro-esophageal reflux disease without esophagitis: Secondary | ICD-10-CM

## 2020-08-06 DIAGNOSIS — E876 Hypokalemia: Secondary | ICD-10-CM

## 2020-08-06 DIAGNOSIS — I10 Essential (primary) hypertension: Secondary | ICD-10-CM

## 2020-08-06 NOTE — Telephone Encounter (Signed)
Please send all patient's medication to desired pharmacy.

## 2020-08-06 NOTE — Telephone Encounter (Signed)
Changing pharmacy, [facility] on [address] wouldn't accommodate her. She didn't want to pay co-pays and she has medicare advantage and medicaid. She needs all of her stuff sent to [facility] on [address]. All her medications and her whole profile.  CALL [facility] at [phone] if we have any questions.

## 2020-08-07 MED ORDER — JANUVIA 25 MG PO TABS: 25.0000 mg | ORAL_TABLET | Freq: Every day | ORAL | 3 refills | Status: DC

## 2020-08-07 MED ORDER — HYDROCHLOROTHIAZIDE 25 MG PO TABS: ORAL_TABLET | ORAL | 3 refills | Status: DC

## 2020-08-07 MED ORDER — LEVOTHYROXINE SODIUM 25 MCG PO TABS: 25.0000 ug | ORAL_TABLET | Freq: Every day | ORAL | 3 refills | Status: DC

## 2020-08-07 MED ORDER — METOPROLOL SUCCINATE ER 50 MG PO TB24: 50.0000 mg | ORAL_TABLET | Freq: Every day | ORAL | 3 refills | Status: DC

## 2020-08-07 MED ORDER — POTASSIUM CHLORIDE ER 10 MEQ PO TBCR: EXTENDED_RELEASE_TABLET | ORAL | 3 refills | Status: DC

## 2020-08-07 MED ORDER — OMEPRAZOLE 40 MG PO CPDR: DELAYED_RELEASE_CAPSULE | ORAL | 3 refills | Status: DC

## 2020-08-07 MED ORDER — FLUTICASONE-UMECLIDIN-VILANT 100-62.5-25 MCG/INH IN AEPB: 100.0000 ug | INHALATION_SPRAY | Freq: Every day | RESPIRATORY_TRACT | 3 refills | Status: DC

## 2020-08-07 MED ORDER — AMLODIPINE BESYLATE 5 MG PO TABS: 5.0000 mg | ORAL_TABLET | Freq: Every day | ORAL | 3 refills | Status: DC

## 2020-08-07 MED ORDER — ROSUVASTATIN CALCIUM 20 MG PO TABS: ORAL_TABLET | ORAL | 3 refills | Status: DC

## 2020-08-07 MED ORDER — METFORMIN HCL 1000 MG PO TABS
ORAL_TABLET | ORAL | 3 refills | Status: DC
Start: 2020-08-07 — End: 2020-10-01

## 2020-08-07 NOTE — Telephone Encounter (Signed)
Medications pended and sent to [facility] for approval.  Did not pend Psych. Medications.

## 2020-08-07 NOTE — Addendum Note (Signed)
Addended by: Rafael Bihari A on: 08/07/2020 08:56 AM   Modules accepted: Orders

## 2020-08-10 ENCOUNTER — Telehealth: Payer: Self-pay | Admitting: Orthopedic Surgery

## 2020-08-10 DIAGNOSIS — H0100A Unspecified blepharitis right eye, upper and lower eyelids: Secondary | ICD-10-CM | POA: Diagnosis not present

## 2020-08-10 DIAGNOSIS — H0100B Unspecified blepharitis left eye, upper and lower eyelids: Secondary | ICD-10-CM | POA: Diagnosis not present

## 2020-08-10 NOTE — Telephone Encounter (Signed)
I do not know what else to do for this pt. She needs an appt to eval what is going on. She has been told this numerous times. Have injections been approved because pt states that she has approval letter for bilat gel injections. We can not give medication without appt and she calls multiple times a week.

## 2020-08-10 NOTE — Telephone Encounter (Signed)
Patient called complaining that she had to pay out of pocket for medication over the weekend. Patient states the meds she purchased is not working and can Dr. Sharol Given please call her 800 Ibuprofen in to pharmacy. Patient is asking for a call back from Dr. Sharol Given. Patient phone number is [phone]. Also patient is asking for April to call her first thing when we open about her injections

## 2020-08-10 NOTE — Telephone Encounter (Signed)
Pt would like a call regarding her appt for Gel injections

## 2020-08-11 NOTE — Telephone Encounter (Signed)
IC talked with patient at length. Advised her she needed to keep appt with Dr Sharol Given to further discuss her need for pain medication. Patient got very upset stating that she would find her another doctor, she would just take medications she could buy OTC and she would go to a medical supply store and find her a brace herself. She said that she was not happy that we would not refill her pain medications.

## 2020-08-11 NOTE — Telephone Encounter (Signed)
Noted  

## 2020-08-12 ENCOUNTER — Telehealth: Payer: Self-pay | Admitting: Nurse Practitioner

## 2020-08-12 ENCOUNTER — Telehealth: Payer: Self-pay

## 2020-08-12 DIAGNOSIS — K219 Gastro-esophageal reflux disease without esophagitis: Secondary | ICD-10-CM

## 2020-08-12 NOTE — Telephone Encounter (Signed)
Patient is requesting Carafate and Omeprazole medications

## 2020-08-12 NOTE — Telephone Encounter (Signed)
Called and left a VM concerning gel injections for bilateral knee. See previous message as well.

## 2020-08-12 NOTE — Telephone Encounter (Signed)
Chart reviewed.  Patient has not been seen in clinic since 03/04/20.  Phone note for refill request on 06/25/20 patient was given enough to hold patient until her follow up with Dr. Hilarie Fredrickson 07/14/20.  Appointment for 07/14/20 was canceled due to being OON.  Please advise on this refill.

## 2020-08-12 NOTE — Telephone Encounter (Signed)
Received Monovisc injections from [employer] patient assistance for bilateral knee. No Co-pay Patient may receive gel injections at her next scheduled appointment.

## 2020-08-12 NOTE — Telephone Encounter (Signed)
Called and left a VM advising patient that I received the medication for her knees and to give me a call back.

## 2020-08-13 ENCOUNTER — Ambulatory Visit (INDEPENDENT_AMBULATORY_CARE_PROVIDER_SITE_OTHER): Payer: Medicare Other | Admitting: Orthopedic Surgery

## 2020-08-13 ENCOUNTER — Telehealth: Payer: Self-pay | Admitting: Internal Medicine

## 2020-08-13 DIAGNOSIS — K219 Gastro-esophageal reflux disease without esophagitis: Secondary | ICD-10-CM

## 2020-08-13 DIAGNOSIS — M170 Bilateral primary osteoarthritis of knee: Secondary | ICD-10-CM

## 2020-08-13 DIAGNOSIS — M7541 Impingement syndrome of right shoulder: Secondary | ICD-10-CM

## 2020-08-13 MED ORDER — OMEPRAZOLE 40 MG PO CPDR: DELAYED_RELEASE_CAPSULE | ORAL | 1 refills | Status: DC

## 2020-08-13 MED ORDER — IBUPROFEN 800 MG PO TABS
800.0000 mg | ORAL_TABLET | Freq: Three times a day (TID) | ORAL | 0 refills | Status: DC | PRN
Start: 2020-08-13 — End: 2020-11-19

## 2020-08-13 MED ORDER — SUCRALFATE 1 GM/10ML PO SUSP: ORAL | 1 refills | Status: DC

## 2020-08-13 NOTE — Telephone Encounter (Signed)
Omeprazole and sucralfate scripts cancelled at [facility]. Sucralfate sent to [facility]. Omeprazole appears to have already been sent by patient's PCP, Marlowe Sax, NP on 08/07/20 with 3 refills to [facility].

## 2020-08-13 NOTE — Telephone Encounter (Signed)
I contacted patient to find out if she has an upcoming appointment with another GI physician coming up (we can give medication until that appointment). Patient asks why she would need to go to another GI physician. I explained that her last appointment was canceled as her insurance is out of network with our office. Patient tells me that "somebody has been using my signature to sign for [facility] Medicare but I dont have that. I have Medicare and Medicaid and it is being fixed in the next 8-10 days." She asks why she wouldn't be allowed to be seen here. I advised that she is free to see whomever she wants but there is a chance that any charges placed would not be covered by her insurance if she is indeed out of network. Patient sounded irritated and said "do you want me to make an appointment or what? [name] gettin' paid." I advised that we want to make sure she has continued GI care wherever that may be. She states that she will make a follow up appointment with Dr [name] Long and if her insurance doesn't pay, "yall can just bill me." Appointment has been made for follow up on 10/06/20 at 2:30 pm and I told patient I would send her enough medication to get her through until this follow up appointment. She verbalizes understanding.

## 2020-08-16 ENCOUNTER — Telehealth: Payer: Self-pay | Admitting: Physician Assistant

## 2020-08-16 NOTE — Telephone Encounter (Signed)
Telephone call  08/16/2020 10:12 AM  Patient called on-call service this morning and told me that she is being "tortured", by a "voodoo group", tells me that they are "sticking pins in places like her rectum and stomach", tells me she is not really sure how they are getting to those places without using the same sort of equipment that "Dr. Hilarie Fredrickson has", and he is the only gastroenterologist she knows.  Describes that she had EGD and colonoscopy in May of last year and thinks this is when they "started their torture".  She would like to talk to Dr. Hilarie Fredrickson about this in person.  Explained that she has an appointment with him in April.  She described that this would be okay and she would wait until then to discuss further.  She is going to call the police and try to get the law involved to stop these people.  Patient was appreciative for my phone call and understanding.  Ellouise Newer, PA-C

## 2020-08-21 ENCOUNTER — Encounter: Payer: Self-pay | Admitting: Family

## 2020-08-21 ENCOUNTER — Other Ambulatory Visit: Payer: Self-pay

## 2020-08-21 ENCOUNTER — Ambulatory Visit (INDEPENDENT_AMBULATORY_CARE_PROVIDER_SITE_OTHER): Payer: Medicare Other | Admitting: Family

## 2020-08-21 ENCOUNTER — Encounter: Payer: Self-pay | Admitting: Orthopedic Surgery

## 2020-08-21 VITALS — BP 114/68 | HR 78 | Temp 96.9°F | Resp 16 | Ht 65.0 in | Wt 149.8 lb

## 2020-08-21 DIAGNOSIS — E1122 Type 2 diabetes mellitus with diabetic chronic kidney disease: Secondary | ICD-10-CM | POA: Diagnosis not present

## 2020-08-21 DIAGNOSIS — F250 Schizoaffective disorder, bipolar type: Secondary | ICD-10-CM

## 2020-08-21 DIAGNOSIS — M7541 Impingement syndrome of right shoulder: Secondary | ICD-10-CM

## 2020-08-21 DIAGNOSIS — M170 Bilateral primary osteoarthritis of knee: Secondary | ICD-10-CM

## 2020-08-21 DIAGNOSIS — R2681 Unsteadiness on feet: Secondary | ICD-10-CM | POA: Diagnosis not present

## 2020-08-21 DIAGNOSIS — N183 Chronic kidney disease, stage 3 unspecified: Secondary | ICD-10-CM

## 2020-08-21 DIAGNOSIS — M159 Polyosteoarthritis, unspecified: Secondary | ICD-10-CM | POA: Diagnosis not present

## 2020-08-21 DIAGNOSIS — F0281 Dementia in other diseases classified elsewhere with behavioral disturbance: Secondary | ICD-10-CM | POA: Diagnosis not present

## 2020-08-21 DIAGNOSIS — F02818 Dementia in other diseases classified elsewhere, unspecified severity, with other behavioral disturbance: Secondary | ICD-10-CM

## 2020-08-21 MED ORDER — LIDOCAINE HCL 1 % IJ SOLN
1.0000 mL | INTRAMUSCULAR | Status: AC | PRN
  Administered 2020-08-21: 1 mL

## 2020-08-21 MED ORDER — HYALURONAN 88 MG/4ML IX SOSY
88.0000 mg | PREFILLED_SYRINGE | INTRA_ARTICULAR | Status: AC | PRN
  Administered 2020-08-21: 88 mg via INTRA_ARTICULAR

## 2020-08-21 MED ORDER — ROSUVASTATIN CALCIUM 20 MG PO TABS: ORAL_TABLET | ORAL | 1 refills | Status: DC

## 2020-08-21 MED ORDER — METHYLPREDNISOLONE ACETATE 40 MG/ML IJ SUSP
40.0000 mg | INTRAMUSCULAR | Status: AC | PRN
  Administered 2020-08-21: 40 mg via INTRA_ARTICULAR

## 2020-08-21 MED ORDER — LIDOCAINE HCL 1 % IJ SOLN
5.0000 mL | INTRAMUSCULAR | Status: AC | PRN
  Administered 2020-08-21: 5 mL

## 2020-08-21 NOTE — Progress Notes (Signed)
Provider: Marlowe Sax FNP-C  Eligha Kmetz, Nelda Bucks, NP  Patient Care Team: Federick Levene, Nelda Bucks, NP as PCP - General (Family Medicine) Newt Minion, MD as Consulting Physician (Orthopedic Surgery) Dixie Dials, MD as Consulting Physician (Cardiology)  Extended Emergency Contact Information Primary Emergency Contact: Shelda Pal Address: [address]          [place], [place] [address] [name] of [place] Phone: [phone] Mobile Phone: [phone] Relation: Daughter Secondary Emergency Contact: [place] of [place] Phone: [phone] Relation: Daughter  Code Status: Full Code  Goals of care: Advanced Directive information Advanced Directives 08/21/2020  Does Patient Have a Medical Advance Directive? Yes  Type of Advance Directive -  Does patient want to make changes to medical advance directive? No - Patient declined  Copy of [place] in Chart? -  Would patient like information on creating a medical advance directive? -  Some encounter information is confidential and restricted. Go to Review Flowsheets activity to see all data.     Chief Complaint  Patient presents with  . Referral    Patient wants home health aid referral.  . Other     Patient guardian not present during visit.    HPI:  Pt is a 70 y.o. female seen today for an acute visit for referral to [place] for Nurse Aid.she states had Nurse Aid from [facility] at [address],[place], [address] Tel # [phone] and Fax # [phone]   Has trouble opening cans/bolltes due to neuropathy and osteoarthritis .needs assistance with cooking. Also needs assistance with getting in and out of the tub.she is able to do own grooming.  States sustained a fall episode several weeks ago without any injuries.she uses a can but states has been bearing more weight on left knee due to pain on the right knee awaiting to see orthopedic for  knee injection.   Past Medical History:  Diagnosis Date  . Abscessed tooth 02/09/2020  . Acute on chronic congestive heart failure ([place])   . Adenomatous colon polyp   . Arthritis    "real bad; all over" (07/12/2017)  . Asthma   . Atrial fibrillation ([place])   . BIPOLAR DISORDER 08/31/2006   Qualifier: Diagnosis of  By: Dorathy Daft MD, Marjory Lies    . CHRONIC KIDNEY DISEASE STAGE II (MILD) 09/14/2009   Annotation: eGFR 64 Qualifier: Diagnosis of  By: Jess Barters MD, [name]    . Chronic lower back pain   . Congestive heart failure (CHF) ([place])   . COPD (chronic obstructive pulmonary disease) ([place]) 09/23/2010   Diagnosed at [facility] in 2008 (Dr. Annamaria Boots)   . DM (diabetes mellitus) type II controlled with renal manifestation ([place]) 08/31/2006   Qualifier: Diagnosis of  By: Dorathy Daft MD, Marjory Lies    . Dyspnea    "all my life; since 6th grade" (07/12/2017)  . Fatty liver   . HYPERCHOLESTEROLEMIA 08/31/2006   Intolerance to Lipitor OK on Crestor but medicaid no longer covering    . HYPERTENSION, BENIGN SYSTEMIC 08/31/2006   Qualifier: Diagnosis of  By: Dorathy Daft MD, Marjory Lies    . Hypokalemia   . HYPOTHYROIDISM, UNSPECIFIED 08/31/2006   Qualifier: Diagnosis of  By: Dorathy Daft MD, Marjory Lies    . Leg swelling 03/14/2018  . Pneumonia    "3 times" (07/12/2017)  . Pulmonary nodule   . Renal cyst   . Schizophrenia ([place])   . Scoliosis   . Stomach problems   . Thyroid disorder  Past Surgical History:  Procedure Laterality Date  . CESAREAN SECTION    . FOOT FRACTURE SURGERY Right    "steel plate in it"  . FOOT SURGERY     " born w/dislocated foot"  . FRACTURE SURGERY    . KNEE ARTHROSCOPY Right   . TOE SURGERY Bilateral    "both pinky toes"  . TONSILLECTOMY AND ADENOIDECTOMY      Allergies  Allergen Reactions  . Citrus Anaphylaxis and Itching  . Fish Allergy Anaphylaxis    Cod  . Shellfish Allergy Shortness Of Breath and Other (See Comments)    "Affects thyroid" also  . Adhesive [Tape] Other (See Comments)     Must have paper tape only  . Ibuprofen Swelling    Face swells  . Latex Dermatitis  . Lipitor [Atorvastatin Calcium] Other (See Comments)    Body aches  . Lisinopril Other (See Comments)    PER DR. Melvyn Novas (not recalled by patient)  . Other Nausea Only and Other (See Comments)    Collards (gas, too)  . Codeine Rash  . Tramadol Palpitations    Outpatient Encounter Medications as of 08/21/2020  Medication Sig  . acetaminophen (TYLENOL) 500 MG tablet Take 2 tablets (1,000 mg total) by mouth in the morning and at bedtime. And 500 mg tablet daily at 2 pm  . amLODipine (NORVASC) 5 MG tablet Take 1 tablet (5 mg total) by mouth daily.  . Blood Glucose Monitoring Suppl (ONETOUCH VERIO) w/Device KIT Use to test blood sugar once daily. Dx: E11.9  . Blood Pressure Monitoring (BLOOD PRESSURE MONITOR/WRIST) KIT 1 Units by Does not apply route daily at 6 (six) AM.  . estradiol (ESTRACE) 0.1 MG/GM vaginal cream Place 1 Applicatorful vaginally at bedtime.  . Fluticasone-Umeclidin-Vilant 100-62.5-25 MCG/INH AEPB Inhale 100 mcg into the lungs daily.  Marland Kitchen glucose blood (ONETOUCH VERIO) test strip Use to test blood sugar once daily. Dx: E11.9  . hydrochlorothiazide (HYDRODIURIL) 25 MG tablet Take one tablet by mouth once daily.  Marland Kitchen HYDROcodone-acetaminophen (NORCO/VICODIN) 5-325 MG tablet Take 1 tablet by mouth every 4 (four) hours as needed for moderate pain.  Marland Kitchen ibuprofen (ADVIL) 800 MG tablet Take 1 tablet (800 mg total) by mouth every 8 (eight) hours as needed.  Marland Kitchen JANUVIA 25 MG tablet Take 1 tablet (25 mg total) by mouth daily.  . Lancets (ONETOUCH ULTRASOFT) lancets Use to test blood sugar once daily. Dx: E11.9  . levothyroxine (SYNTHROID) 25 MCG tablet Take 1 tablet (25 mcg total) by mouth daily before breakfast.  . LORazepam (ATIVAN) 0.5 MG tablet Take 0.5 mg by mouth daily as needed.  . metFORMIN (GLUCOPHAGE) 1000 MG tablet Take one tablet by mouth twice daily with a meal.  . methylPREDNISolone (MEDROL) 4  MG tablet Take 0.5 tablets (2 mg total) by mouth daily.  . metoprolol succinate (TOPROL-XL) 50 MG 24 hr tablet Take 1 tablet (50 mg total) by mouth daily. Take with or immediately following a meal.  . omeprazole (PRILOSEC) 40 MG capsule Take one capsule by mouth once daily.  . potassium chloride (KLOR-CON) 10 MEQ tablet Take one tablet by mouth once daily.  . rosuvastatin (CRESTOR) 20 MG tablet Take one tablet by mouth once daily.  . sucralfate (CARAFATE) 1 GM/10ML suspension SHAKE LIQUID AND TAKE 10 ML(1 GRAM) BY MOUTH FOUR TIMES DAILY  . potassium chloride SA (KLOR-CON M20) 20 MEQ tablet Take 2 tablets (40 mEq total) by mouth 2 (two) times daily for 1 day.  . [DISCONTINUED] divalproex (DEPAKOTE)  500 MG DR tablet Take 500 mg by mouth 2 (two) times daily.  . [DISCONTINUED] risperiDONE (RISPERDAL) 0.5 MG tablet Take 0.5 mg by mouth daily.  . [DISCONTINUED] risperiDONE (RISPERDAL) 0.5 MG tablet Take 1.5 mg by mouth at bedtime. Take 3 tablets to = 1.5 mg   Facility-Administered Encounter Medications as of 08/21/2020  Medication  . 0.9 %  sodium chloride infusion    Review of Systems  Constitutional: Negative for appetite change, chills and fatigue.  HENT: Negative for congestion, rhinorrhea, sinus pressure, sinus pain, sneezing and sore throat.   Eyes: Negative for discharge, redness and itching.  Respiratory: Negative for cough, chest tightness, shortness of breath and wheezing.   Cardiovascular: Negative for chest pain, palpitations and leg swelling.  Gastrointestinal: Negative for abdominal distention, abdominal pain, constipation, diarrhea, nausea and vomiting.  Genitourinary: Negative for difficulty urinating, dysuria, flank pain, frequency and urgency.  Musculoskeletal: Positive for arthralgias, back pain and gait problem. Negative for joint swelling, myalgias and neck pain.  Skin: Negative for color change, pallor and rash.  Neurological: Positive for numbness. Negative for dizziness,  speech difficulty, weakness and headaches.  Hematological: Does not bruise/bleed easily.  Psychiatric/Behavioral: Negative for agitation, behavioral problems, confusion and sleep disturbance. The patient is not nervous/anxious.     Immunization History  Administered Date(s) Administered  . Influenza Split 05/17/2011, 04/27/2012  . Influenza Whole 07/11/2007, 06/30/2008, 06/02/2009, 04/22/2010  . Influenza,inj,Quad PF,6+ Mos 04/16/2013, 05/02/2014, 04/26/2017, 02/23/2018, 04/11/2019  . PFIZER(Purple Top)SARS-COV-2 Vaccination 10/25/2019, 11/19/2019  . Pneumococcal Conjugate-13 04/26/2017  . Pneumococcal Polysaccharide-23 07/11/2007, 06/08/2011, 03/12/2020  . Td 08/04/1998  . Tdap 06/08/2011   Pertinent  Health Maintenance Due  Topic Date Due  . FOOT EXAM  04/26/2018  . OPHTHALMOLOGY EXAM  07/18/2020  . INFLUENZA VACCINE  10/01/2020 (Originally 02/02/2020)  . LIPID PANEL  11/23/2020  . HEMOGLOBIN A1C  11/23/2020  . URINE MICROALBUMIN  03/23/2021  . MAMMOGRAM  05/27/2022  . COLONOSCOPY (Pts 45-49yr Insurance coverage will need to be confirmed)  11/05/2022  . DEXA SCAN  Completed  . PNA vac Low Risk Adult  Completed   Fall Risk  08/21/2020 05/26/2020 04/28/2020 03/23/2020 03/12/2020  Falls in the past year? 0 0 1 0 1  Number falls in past yr: 0 0 0 0 0  Injury with Fall? 0 0 1 0 -  Follow up - - - - Falls evaluation completed   Functional Status Survey:    Vitals:   08/21/20 1502  BP: 114/68  Pulse: 78  Resp: 16  Temp: (!) 96.9 F (36.1 C)  SpO2: 97%  Weight: 149 lb 12.8 oz (67.9 kg)  Height: '5\' 5"'  (1.651 m)   Body mass index is 24.93 kg/m. Physical Exam Vitals reviewed.  Constitutional:      General: She is not in acute distress.    Appearance: She is normal weight. She is not ill-appearing.  HENT:     Head: Normocephalic.  Eyes:     General: No scleral icterus.       Right eye: No discharge.        Left eye: No discharge.     Extraocular Movements: Extraocular  movements intact.     Conjunctiva/sclera: Conjunctivae normal.     Pupils: Pupils are equal, round, and reactive to light.  Neck:     Vascular: No carotid bruit.  Cardiovascular:     Rate and Rhythm: Normal rate and regular rhythm.     Pulses: Normal pulses.     Heart  sounds: No murmur heard. Friction rub present. No gallop.   Pulmonary:     Effort: Pulmonary effort is normal. No respiratory distress.     Breath sounds: Normal breath sounds. No wheezing, rhonchi or rales.  Chest:     Chest wall: No tenderness.  Abdominal:     General: Bowel sounds are normal. There is no distension.     Palpations: Abdomen is soft. There is no mass.     Tenderness: There is no abdominal tenderness. There is no right CVA tenderness, left CVA tenderness, guarding or rebound.  Musculoskeletal:        General: No swelling or tenderness.     Right shoulder: Crepitus present. No swelling or effusion. Normal range of motion. Normal strength. Normal pulse.     Left shoulder: Crepitus present. No swelling or effusion. Normal range of motion. Normal strength. Normal pulse.     Cervical back: Normal range of motion. No rigidity or tenderness.     Right knee: Crepitus present. No swelling, effusion, erythema or ecchymosis. Normal range of motion. No tenderness.     Left knee: Crepitus present. No swelling, effusion, erythema or ecchymosis. Normal range of motion. No tenderness.     Right lower leg: No edema.     Left lower leg: No edema.  Lymphadenopathy:     Cervical: No cervical adenopathy.  Skin:    General: Skin is warm and dry.     Coloration: Skin is not pale.     Findings: No bruising, erythema or rash.  Neurological:     Mental Status: She is alert. Mental status is at baseline.     Cranial Nerves: No cranial nerve deficit.     Motor: No weakness.     Gait: Gait abnormal.  Psychiatric:        Mood and Affect: Mood normal.        Speech: Speech normal.        Behavior: Behavior normal.         Cognition and Memory: Memory is impaired.     Labs reviewed: Recent Labs    04/28/20 1509 04/30/20 1206 05/26/20 1553  NA 136 139 135  K 2.7* 3.3* 3.7  CL 96* 99 96*  CO2 31 32 34*  GLUCOSE 150* 122 141*  BUN '21 17 23  ' CREATININE 1.26* 1.16* 1.20*  CALCIUM 9.4 8.9 9.5   Recent Labs    03/04/20 0956 04/03/20 1821 04/28/20 1509 05/26/20 1553  AST '19 21 13 11  ' ALT '15 16 9 12  ' ALKPHOS 77 76  --   --   BILITOT 0.5 0.9 0.2 0.3  PROT 7.3 7.1 6.9 7.0  ALBUMIN 4.0 3.3*  --   --    Recent Labs    03/04/20 0956 03/09/20 0244 04/03/20 1821 04/28/20 1509 05/26/20 1553  WBC 8.9   < > 6.7 6.7 6.7  NEUTROABS 4.2  --   --  3,176 2,566  HGB 14.2   < > 12.2 10.7* 11.4*  HCT 43.0   < > 36.8 30.7* 34.1*  MCV 98.4   < > 93.4 95.3 97.7  PLT 213.0   < > 243 227 228   < > = values in this interval not displayed.   Lab Results  Component Value Date   TSH 1.51 05/26/2020   Lab Results  Component Value Date   HGBA1C 8.1 (H) 05/26/2020   Lab Results  Component Value Date   CHOL 178 05/26/2020   HDL 60  05/26/2020   LDLCALC 98 05/26/2020   LDLDIRECT 105 (H) 08/14/2012   TRIG 107 05/26/2020   CHOLHDL 3.0 05/26/2020    Significant Diagnostic Results in last 30 days:  No results found.  Assessment/Plan 1. Osteoarthritis of multiple joints, unspecified osteoarthritis type Bilateral knee crepitus noted.No erythema,swelling,effusion or tenderness. - continue on current pain regimen. - Ambulatory referral to [facility] for Nurse Aid to assist with her ADL's  -   2. Unsteady gait Remains high risk for falls. - Ambulatory referral to Home Health Nurse Aid to assist with getting in and out of the shower for fall precautions   3. Schizoaffective disorder, bipolar type ([place]) Continue current medication and follow up with Psychiatry.  - Ambulatory referral to [facility] as above   4. Dementia associated with other underlying disease with behavioral disturbance  ([place]) Forgetful.continue with supportive care - Ambulatory referral to [facility] for Nurse Aid to assist with ADL's   5. Type 2 diabetes mellitus with stage 3 chronic kidney disease, without long-term current use of insulin, unspecified whether stage 3a or 3b CKD ([place]) Lab Results  Component Value Date   HGBA1C 8.1 (H) 05/26/2020  No home CBG for review. - continue on metformin Request refill for Crestor.  - rosuvastatin (CRESTOR) 20 MG tablet; Take one tablet by mouth once daily.  Dispense: 90 tablet; Refill: 1    Family/ staff Communication: Reviewed plan of care with patient  Labs/tests ordered: None   Next Appointment: Has upcoming appointment in 11/24/2020   Sandrea Hughs, NP

## 2020-08-21 NOTE — Patient Instructions (Signed)
-   Home health ordered this visit.[facility] will call you for appointment

## 2020-08-21 NOTE — Progress Notes (Signed)
Office Visit Note   Patient: [name] Long           Date of Birth: [birth year]           MRN: [id number] Visit Date: 08/13/2020              Requested by: Sandrea Hughs, NP [address] [place],  [place] [address] PCP: Sandrea Hughs, NP  Chief Complaint  Patient presents with  . Left Knee - Pain    Bilateral monovisc injections purchased through [facility]   . Right Knee - Pain      HPI: Patient is a 70 year old woman who presents with 2 separate complaints she has had chronic pain in both knees and presents for a Monovisc injection for both knees she states that she also has right shoulder pain with reaching for items.  Patient states that people are beening made up to look like her and causing her problems.  Assessment & Plan: Visit Diagnoses:  1. Bilateral primary osteoarthritis of knee   2. Impingement syndrome of right shoulder     Plan: Both knees were injected and her right shoulder plan to follow-up as needed.  Follow-Up Instructions: Return if symptoms worsen or fail to improve.   Ortho Exam  Patient is alert, oriented, no adenopathy, well-dressed, normal affect, normal respiratory effort. Examination of both knees patient has pain with range of motion there is no effusion no cellulitis no signs of infection the right shoulder has abduction and flexion of 70 degrees she has pain with Neer and Hawkins impingement test.  Imaging: No results found. No images are attached to the encounter.  Labs: Lab Results  Component Value Date   HGBA1C 8.1 (H) 05/26/2020   HGBA1C 10.3 (A) 03/23/2020   HGBA1C 12.0 (A) 02/03/2020   LABORGA NO GROWTH 12/04/2014     Lab Results  Component Value Date   ALBUMIN 3.3 (L) 04/03/2020   ALBUMIN 4.0 03/04/2020   ALBUMIN 3.4 (L) 09/08/2018    Lab Results  Component Value Date   MG 1.7 12/25/2016   MG 1.8 06/10/2016   MG 1.8 02/02/2016   Lab Results  Component Value Date   VD25OH 43.0 02/23/2018   VD25OH 30 05/06/2015    VD25OH 12 (L) 12/18/2014    No results found for: PREALBUMIN CBC EXTENDED Latest Ref Rng & Units 05/26/2020 04/28/2020 04/03/2020  WBC 3.8 - 10.8 Thousand/uL 6.7 6.7 6.7  RBC 3.80 - 5.10 Million/uL 3.49(L) 3.22(L) 3.94  HGB 11.7 - 15.5 g/dL 11.4(L) 10.7(L) 12.2  HCT 35.0 - 45.0 % 34.1(L) 30.7(L) 36.8  PLT 140 - 400 Thousand/uL 228 227 243  NEUTROABS 1,500 - 7,800 cells/uL 2,566 3,176 -  LYMPHSABS 850 - 3,900 cells/uL 2,834 2,526 -     There is no height or weight on file to calculate BMI.  Orders:  No orders of the defined types were placed in this encounter.  Meds ordered this encounter  Medications  . ibuprofen (ADVIL) 800 MG tablet    Sig: Take 1 tablet (800 mg total) by mouth every 8 (eight) hours as needed.    Dispense:  30 tablet    Refill:  0     Procedures: Large Joint Inj: R subacromial bursa on 08/21/2020 4:30 PM Indications: diagnostic evaluation and pain Details: 22 G 1.5 in needle, posterior approach  Arthrogram: No  Medications: 5 mL lidocaine 1 %; 40 mg methylPREDNISolone acetate 40 MG/ML Outcome: tolerated well, no immediate complications Procedure, treatment alternatives, risks  and benefits explained, specific risks discussed. Consent was given by the patient. Immediately prior to procedure a time out was called to verify the correct patient, procedure, equipment, support staff and site/side marked as required. Patient was prepped and draped in the usual sterile fashion.   Large Joint Inj: bilateral knee on 08/21/2020 4:30 PM Indications: pain and diagnostic evaluation Details: 22 G 1.5 in needle, anteromedial approach  Arthrogram: No  Medications (Right): 88 mg Hyaluronan 88 MG/4ML; 1 mL lidocaine 1 % Medications (Left): 88 mg Hyaluronan 88 MG/4ML; 1 mL lidocaine 1 % Outcome: tolerated well, no immediate complications Procedure, treatment alternatives, risks and benefits explained, specific risks discussed. Consent was given by the patient.  Immediately prior to procedure a time out was called to verify the correct patient, procedure, equipment, support staff and site/side marked as required. Patient was prepped and draped in the usual sterile fashion.      Clinical Data: No additional findings.  ROS:  All other systems negative, except as noted in the HPI. Review of Systems  Objective: Vital Signs: There were no vitals taken for this visit.  Specialty Comments:  No specialty comments available.  PMFS History: Patient Active Problem List   Diagnosis Date Noted  . Bipolar I disorder, most recent episode depressed, severe w psychosis ([place])   . Dementia associated with other underlying disease with behavioral disturbance ([place]) 03/27/2020  . GERD (gastroesophageal reflux disease) 12/27/2019  . Post-menopausal atrophic vaginitis 12/13/2019  . Vagina, candidiasis 12/10/2019  . Vaginal itching 11/10/2019  . Dysphagia 07/02/2019  . Screening examination for STD (sexually transmitted disease) 03/25/2019  . 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 Other chronic pain 12/03/2018  . Dizziness of unknown cause 08/04/2018  . Tremor 07/15/2018  . Atrial flutter with rapid ventricular response ([place]) 07/10/2017  . Atrial fibrillation ([place]) 07/05/2017  . COPD (chronic obstructive pulmonary disease) ([place]) 01/22/2017  . Pedal edema   . Chronic bilateral low back pain without sciatica 08/15/2016  . 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 Schizoaffective disorder ([place])   . Hypokalemia 01/22/2015  . Osteoarthritis, multiple sites 06/08/2011  . Dysuria 02/14/2011  . CHRONIC KIDNEY DISEASE STAGE II (MILD) 09/14/2009  . Hypothyroidism 08/31/2006  . Type 2 diabetes mellitus ([place]) 08/31/2006  . HYPERCHOLESTEROLEMIA 08/31/2006  . Tobacco abuse 08/31/2006  . HYPERTENSION, BENIGN SYSTEMIC 08/31/2006   Past Medical History:  Diagnosis Date  . Abscessed tooth 02/09/2020  . Acute on chronic  congestive heart failure ([place])   . Adenomatous colon polyp   . Arthritis    "real bad; all over" (07/12/2017)  . Asthma   . Atrial fibrillation ([place])   . BIPOLAR DISORDER 08/31/2006   Qualifier: Diagnosis of  By: Dorathy Daft MD, Marjory Lies    . CHRONIC KIDNEY DISEASE STAGE II (MILD) 09/14/2009   Annotation: eGFR 64 Qualifier: Diagnosis of  By: Jess Barters MD, [name]    . Chronic lower back pain   . Congestive heart failure (CHF) ([place])   . COPD (chronic obstructive pulmonary disease) ([place]) 09/23/2010   Diagnosed at [facility] in 2008 (Dr. Annamaria Boots)   . DM (diabetes mellitus) type II controlled with renal manifestation ([place]) 08/31/2006   Qualifier: Diagnosis of  By: Dorathy Daft MD, Marjory Lies    . Dyspnea    "all my life; since 6th grade" (07/12/2017)  . Fatty liver   . HYPERCHOLESTEROLEMIA 08/31/2006   Intolerance to Lipitor OK on Crestor but medicaid no longer covering    . HYPERTENSION, BENIGN SYSTEMIC  08/31/2006   Qualifier: Diagnosis of  By: Dorathy Daft MD, Marjory Lies    . Hypokalemia   . HYPOTHYROIDISM, UNSPECIFIED 08/31/2006   Qualifier: Diagnosis of  By: Dorathy Daft MD, Marjory Lies    . Leg swelling 03/14/2018  . Pneumonia    "3 times" (07/12/2017)  . Pulmonary nodule   . Renal cyst   . Schizophrenia ([place])   . Scoliosis   . Stomach problems   . Thyroid disorder     Family History  Problem Relation Age of Onset  . Breast cancer Sister   . Heart disease Mother 106  . Rectal cancer Mother   . Diabetes Father 35  . Hypertension Father   . Heart disease Brother   . Asthma Daughter   . Asthma Son   . Breast cancer Sister   . Asthma Daughter   . Colon cancer Maternal Grandmother   . Stomach cancer Neg Hx   . Esophageal cancer Neg Hx   . Pancreatic cancer Neg Hx     Past Surgical History:  Procedure Laterality Date  . CESAREAN SECTION    . FOOT FRACTURE SURGERY Right    "steel plate in it"  . FOOT SURGERY     " born w/dislocated foot"  . FRACTURE SURGERY    . KNEE ARTHROSCOPY Right   . TOE SURGERY Bilateral     "both pinky toes"  . TONSILLECTOMY AND ADENOIDECTOMY     Social History   Occupational History  . Not on file  Tobacco Use  . Smoking status: Current Some Day Smoker    Packs/day: 0.25    Years: 45.00    Pack years: 11.25    Types: Cigarettes  . Smokeless tobacco: Never Used  . Tobacco comment: patient states she smokes 1 cigarette per month  Vaping Use  . Vaping Use: Never used  Substance and Sexual Activity  . Alcohol use: Not Currently    Comment: 07/12/2017 "stopped 2 yr ago; did have a drink over the holidays recently"  . Drug use: No  . Sexual activity: Not Currently

## 2020-08-25 ENCOUNTER — Other Ambulatory Visit: Payer: Self-pay | Admitting: *Deleted

## 2020-08-25 ENCOUNTER — Telehealth: Payer: Self-pay

## 2020-08-25 MED ORDER — GLUCOSE BLOOD VI STRP: ORAL_STRIP | 5 refills | Status: DC

## 2020-08-25 NOTE — Telephone Encounter (Signed)
Yes, this referral was faxed over on Friday 08/21/20.  Monday 08/24/20, received a call from [facility] verifying orders for Ms Creola & stated they would go out for evaluation. I also made sure that I emphasized pt asking for nursing aide & pt had requested them.  Update 08/26/20 with Sharyn Lull: PSA-personal care assist thru [facility] program out of [place] that does a phone evaluation then sends someone out to your home.  So I sent the referral to this fax on Friday & 0 response, which is where the [facility] location stepped in. I have refaxed the referral & clinicals to [facility] location again today for follow up 08/26/20  [phone] or [phone]  Thanks, Vilinda Blanks

## 2020-08-25 NOTE — Telephone Encounter (Signed)
Patient called stating Lattie Haw sent referral to the wrong place and she would like to speak directly with Lattie Haw. I referred patient to Lattie Haw to leave a voicemail. Patient called back and asked that I take a message and send it to [place] stating someone needs to follow the instructions she gave. Patient states she gave Webb Silversmith all the information needed to process her referral correctly.

## 2020-08-25 NOTE — Telephone Encounter (Signed)
She requested Nurse Assistant  From: [facility] [address],[place] [address] Tel # [phone] and Fax # [phone]  Thank you  [name] Ngetich FNP-C

## 2020-08-25 NOTE — Telephone Encounter (Signed)
Patient request [facility] for Nurse assistant to assist with her ADL's.Has had an Environmental consultant from same Agency.

## 2020-08-25 NOTE — Telephone Encounter (Signed)
Patient requested One Touch Ultra 2 Test Strips to be sent to her pharmacy.

## 2020-08-25 NOTE — Telephone Encounter (Signed)
Dottie with [facility] called to inform Webb Silversmith that this patient does not qualify for home health. Patient does not have a skilled need.

## 2020-08-27 ENCOUNTER — Other Ambulatory Visit: Payer: Self-pay | Admitting: Family

## 2020-08-27 ENCOUNTER — Other Ambulatory Visit: Payer: Self-pay | Admitting: Family Medicine

## 2020-08-27 DIAGNOSIS — I10 Essential (primary) hypertension: Secondary | ICD-10-CM

## 2020-08-31 ENCOUNTER — Telehealth: Payer: Self-pay | Admitting: *Deleted

## 2020-08-31 ENCOUNTER — Telehealth: Payer: Self-pay

## 2020-08-31 ENCOUNTER — Telehealth: Payer: Self-pay | Admitting: Internal Medicine

## 2020-08-31 DIAGNOSIS — F250 Schizoaffective disorder, bipolar type: Secondary | ICD-10-CM | POA: Diagnosis not present

## 2020-08-31 NOTE — Telephone Encounter (Signed)
There was no letter written during your last visit.Are you referring to Home health referral to requested home health Agency for a Nurse assistant to help you with your activity of daily Living?

## 2020-08-31 NOTE — Telephone Encounter (Signed)
Received fax from [facility] for Diabetic Detailed Written Order for Glucose Test Strips.  Testing Frequency twice daily Placed in [name] Long's folder to review and sign.  To be faxed back to [facility] Fax: [phone]

## 2020-08-31 NOTE — Telephone Encounter (Signed)
Pt states that she wants Dr. Hilarie Fredrickson to call her to discuss the scopes that he used for her EGD and Colon. Pt is convinced that her sisters have gotten one of his scopes and they are using them in [place]. States that they are using Voodoo and practicing witchcraft in [place] [address]. States they have machines that are "sucking the oxygen out of her head causing her to pass out." She feels they are doing all this to say that she is "crazy" in order to take her house. Also reports they have been forging her signature around town. Reassured pt that no one has taken any scopes from our endoscopy center, they are locked up. She stated that she just wanted to let Dr. Hilarie Fredrickson know.

## 2020-08-31 NOTE — Telephone Encounter (Signed)
Hi Linda, could you please call pt? She left a message with the answering service so I called her to see how we could help her but pt refused to speak with me stating: "I left a message for Dr. Hilarie Fredrickson to call me not you. I don't know you so I don't want to speak with you. I preferred to speak with his nurse Vaughan Basta."

## 2020-08-31 NOTE — Telephone Encounter (Signed)
Patient states that she needs the same letter from appt date 08/21/2020 sent also to [place] [employer] on [address] in [place]. I asked patient for the phone number of this facility and she states that she couldn't find it but she'd get her daughter to find it and call us back. Patient asked me if I was familiar with this facility and I stated that I was not. Patient then threatened me and stated that if I don't get her message to Marlowe Sax, NP that I would need help from "[employer]". Patient has made threats to me many times both in person and on the phone. Message routed to PCP Ngetich, Nelda Bucks, NP  Please advise with patient letter.

## 2020-09-01 NOTE — Telephone Encounter (Signed)
LMOM to return call.

## 2020-09-02 ENCOUNTER — Telehealth: Payer: Self-pay

## 2020-09-02 ENCOUNTER — Ambulatory Visit: Payer: Medicare Other | Admitting: Family

## 2020-09-02 ENCOUNTER — Encounter (HOSPITAL_COMMUNITY): Payer: Self-pay | Admitting: Emergency Medicine

## 2020-09-02 ENCOUNTER — Emergency Department (HOSPITAL_COMMUNITY)
Admission: EM | Admit: 2020-09-02 | Discharge: 2020-09-07 | Disposition: A | Discharge status: Other Acute Inpatient Hospital | Payer: Medicare Other | Attending: Emergency Medicine | Admitting: Emergency Medicine

## 2020-09-02 DIAGNOSIS — J45909 Unspecified asthma, uncomplicated: Secondary | ICD-10-CM | POA: Diagnosis not present

## 2020-09-02 DIAGNOSIS — I509 Heart failure, unspecified: Secondary | ICD-10-CM | POA: Diagnosis not present

## 2020-09-02 DIAGNOSIS — E1122 Type 2 diabetes mellitus with diabetic chronic kidney disease: Secondary | ICD-10-CM | POA: Diagnosis not present

## 2020-09-02 DIAGNOSIS — E039 Hypothyroidism, unspecified: Secondary | ICD-10-CM | POA: Insufficient documentation

## 2020-09-02 DIAGNOSIS — I130 Hypertensive heart and chronic kidney disease with heart failure and stage 1 through stage 4 chronic kidney disease, or unspecified chronic kidney disease: Secondary | ICD-10-CM | POA: Diagnosis not present

## 2020-09-02 DIAGNOSIS — F23 Brief psychotic disorder: Secondary | ICD-10-CM

## 2020-09-02 DIAGNOSIS — F060 Psychotic disorder with hallucinations due to known physiological condition: Secondary | ICD-10-CM | POA: Insufficient documentation

## 2020-09-02 DIAGNOSIS — Z79899 Other long term (current) drug therapy: Secondary | ICD-10-CM | POA: Diagnosis not present

## 2020-09-02 DIAGNOSIS — R259 Unspecified abnormal involuntary movements: Secondary | ICD-10-CM | POA: Diagnosis present

## 2020-09-02 DIAGNOSIS — R451 Restlessness and agitation: Secondary | ICD-10-CM | POA: Diagnosis not present

## 2020-09-02 DIAGNOSIS — Z20822 Contact with and (suspected) exposure to covid-19: Secondary | ICD-10-CM | POA: Diagnosis not present

## 2020-09-02 DIAGNOSIS — F17210 Nicotine dependence, cigarettes, uncomplicated: Secondary | ICD-10-CM | POA: Diagnosis not present

## 2020-09-02 DIAGNOSIS — Z7952 Long term (current) use of systemic steroids: Secondary | ICD-10-CM | POA: Diagnosis not present

## 2020-09-02 DIAGNOSIS — Z91040 Latex allergy status: Secondary | ICD-10-CM | POA: Diagnosis not present

## 2020-09-02 DIAGNOSIS — Z7984 Long term (current) use of oral hypoglycemic drugs: Secondary | ICD-10-CM | POA: Insufficient documentation

## 2020-09-02 DIAGNOSIS — N182 Chronic kidney disease, stage 2 (mild): Secondary | ICD-10-CM | POA: Insufficient documentation

## 2020-09-02 DIAGNOSIS — F22 Delusional disorders: Secondary | ICD-10-CM | POA: Insufficient documentation

## 2020-09-02 DIAGNOSIS — J449 Chronic obstructive pulmonary disease, unspecified: Secondary | ICD-10-CM | POA: Diagnosis not present

## 2020-09-02 LAB — COMPREHENSIVE METABOLIC PANEL
ALT: 18 U/L (ref 0–44)
AST: 19 U/L (ref 15–41)
Albumin: 3.2 g/dL — ABNORMAL LOW (ref 3.5–5.0)
Alkaline Phosphatase: 67 U/L (ref 38–126)
Anion gap: 11 (ref 5–15)
BUN: 38 mg/dL — ABNORMAL HIGH (ref 8–23)
CO2: 24 mmol/L (ref 22–32)
Calcium: 9.4 mg/dL (ref 8.9–10.3)
Chloride: 99 mmol/L (ref 98–111)
Creatinine, Ser: 1.99 mg/dL — ABNORMAL HIGH (ref 0.44–1.00)
GFR, Estimated: 27 mL/min — ABNORMAL LOW (ref >60)
Glucose, Bld: 180 mg/dL — ABNORMAL HIGH (ref 70–99)
Potassium: 2.5 mmol/L — CL (ref 3.5–5.1)
Sodium: 134 mmol/L — ABNORMAL LOW (ref 135–145)
Total Bilirubin: 0.6 mg/dL (ref 0.3–1.2)
Total Protein: 7.2 g/dL (ref 6.5–8.1)

## 2020-09-02 LAB — CBC
HCT: 35 % — ABNORMAL LOW (ref 36.0–46.0)
Hemoglobin: 11.8 g/dL — ABNORMAL LOW (ref 12.0–15.0)
MCH: 31.7 pg (ref 26.0–34.0)
MCHC: 33.7 g/dL (ref 30.0–36.0)
MCV: 94.1 fL (ref 80.0–100.0)
Platelets: 197 10*3/uL (ref 150–400)
RBC: 3.72 MIL/uL — ABNORMAL LOW (ref 3.87–5.11)
RDW: 14 % (ref 11.5–15.5)
WBC: 6 10*3/uL (ref 4.0–10.5)
nRBC: 0 % (ref 0.0–0.2)

## 2020-09-02 LAB — ACETAMINOPHEN LEVEL: Acetaminophen (Tylenol), Serum: <10 ug/mL — ABNORMAL LOW (ref 10–30)

## 2020-09-02 LAB — SALICYLATE LEVEL: Salicylate Lvl: <7 mg/dL — ABNORMAL LOW (ref 7.0–30.0)

## 2020-09-02 LAB — ETHANOL: Alcohol, Ethyl (B): <10 mg/dL (ref <10)

## 2020-09-02 MED ORDER — LORAZEPAM 0.5 MG PO TABS
0.5000 mg | ORAL_TABLET | Freq: Four times a day (QID) | ORAL | Status: DC | PRN
  Administered 2020-09-06: 0.5 mg via ORAL
  Filled 2020-09-02: qty 1

## 2020-09-02 MED ORDER — LINAGLIPTIN 5 MG PO TABS
5.0000 mg | ORAL_TABLET | Freq: Every day | ORAL | Status: DC
  Administered 2020-09-03 – 2020-09-07 (×2): 5 mg via ORAL
  Filled 2020-09-02 (×4): qty 1

## 2020-09-02 MED ORDER — POTASSIUM CHLORIDE IN NACL 20-0.9 MEQ/L-% IV SOLN
Freq: Once | INTRAVENOUS | Status: AC
  Filled 2020-09-02: qty 1000

## 2020-09-02 MED ORDER — PANTOPRAZOLE SODIUM 40 MG PO TBEC
40.0000 mg | DELAYED_RELEASE_TABLET | Freq: Every day | ORAL | Status: DC
  Administered 2020-09-04 – 2020-09-07 (×3): 40 mg via ORAL
  Filled 2020-09-02 (×5): qty 1

## 2020-09-02 MED ORDER — LEVOTHYROXINE SODIUM 25 MCG PO TABS
25.0000 ug | ORAL_TABLET | Freq: Every day | ORAL | Status: DC
  Administered 2020-09-03 – 2020-09-07 (×5): 25 ug via ORAL
  Filled 2020-09-02 (×5): qty 1

## 2020-09-02 MED ORDER — ROSUVASTATIN CALCIUM 5 MG PO TABS
20.0000 mg | ORAL_TABLET | Freq: Every day | ORAL | Status: DC
  Administered 2020-09-03: 20 mg via ORAL
  Administered 2020-09-04: 5 mg via ORAL
  Administered 2020-09-05 – 2020-09-07 (×3): 20 mg via ORAL
  Filled 2020-09-02 (×5): qty 4

## 2020-09-02 MED ORDER — HYDROCHLOROTHIAZIDE 25 MG PO TABS
25.0000 mg | ORAL_TABLET | Freq: Every day | ORAL | Status: DC
  Administered 2020-09-03 – 2020-09-07 (×5): 25 mg via ORAL
  Filled 2020-09-02 (×5): qty 1

## 2020-09-02 MED ORDER — AMLODIPINE BESYLATE 5 MG PO TABS
5.0000 mg | ORAL_TABLET | Freq: Every day | ORAL | Status: DC
  Administered 2020-09-03 – 2020-09-07 (×5): 5 mg via ORAL
  Filled 2020-09-02 (×5): qty 1

## 2020-09-02 MED ORDER — FLUTICASONE FUROATE-VILANTEROL 100-25 MCG/INH IN AEPB: 1.0000 | INHALATION_SPRAY | Freq: Every day | RESPIRATORY_TRACT | Status: DC

## 2020-09-02 MED ORDER — METFORMIN HCL 500 MG PO TABS
1000.0000 mg | ORAL_TABLET | Freq: Two times a day (BID) | ORAL | Status: DC
  Administered 2020-09-03 – 2020-09-07 (×9): 1000 mg via ORAL
  Filled 2020-09-02 (×10): qty 2

## 2020-09-02 MED ORDER — METOPROLOL SUCCINATE ER 25 MG PO TB24
50.0000 mg | ORAL_TABLET | Freq: Every day | ORAL | Status: DC
  Administered 2020-09-03 – 2020-09-07 (×5): 50 mg via ORAL
  Filled 2020-09-02 (×5): qty 2

## 2020-09-02 MED ORDER — FLUTICASONE-UMECLIDIN-VILANT 100-62.5-25 MCG/INH IN AEPB: 100.0000 ug | INHALATION_SPRAY | Freq: Every day | RESPIRATORY_TRACT | Status: DC

## 2020-09-02 MED ORDER — UMECLIDINIUM BROMIDE 62.5 MCG/INH IN AEPB
1.0000 | INHALATION_SPRAY | Freq: Every day | RESPIRATORY_TRACT | Status: DC
  Filled 2020-09-02: qty 7

## 2020-09-02 MED ORDER — POTASSIUM CHLORIDE CRYS ER 10 MEQ PO TBCR
10.0000 meq | EXTENDED_RELEASE_TABLET | Freq: Every day | ORAL | Status: DC
  Administered 2020-09-03: 10 meq via ORAL
  Filled 2020-09-02: qty 1

## 2020-09-02 NOTE — ED Notes (Signed)
Trace Cederberg, daughter, would like an update [phone]

## 2020-09-02 NOTE — ED Notes (Signed)
[name] Long (guardian) [phone] would like an update as soon as possible

## 2020-09-02 NOTE — ED Triage Notes (Signed)
Patient [name] Long under IVC for hallucinations. Patient uncooperative during triage, states that staff can call her doctor if there any questions.

## 2020-09-02 NOTE — ED Provider Notes (Signed)
[facility] EMERGENCY DEPARTMENT Provider Note   CSN: [id number] Arrival date & time: 09/02/20  1229     History Chief Complaint  Patient presents with  . Medical Clearance    [name] Long is a 70 y.o. female.  HPI Presents under involuntary commitment. Patient herself given a tangential account of how she is doing, whether anything hurts, her recent medication complaints, and her follow-up care. History is obtained by patient's daughter.  She notes that patient has had increasingly obvious hallucinations, paranoia, atypical behavior, medication noncompliance, nondirectability. Level 5 caveat secondary to psychiatric condition.    Past Medical History:  Diagnosis Date  . Abscessed tooth 02/09/2020  . Acute on chronic congestive heart failure ([place])   . Adenomatous colon polyp   . Arthritis    "real bad; all over" (07/12/2017)  . Asthma   . Atrial fibrillation ([place])   . BIPOLAR DISORDER 08/31/2006   Qualifier: Diagnosis of  By: Dorathy Daft MD, Marjory Lies    . CHRONIC KIDNEY DISEASE STAGE II (MILD) 09/14/2009   Annotation: eGFR 64 Qualifier: Diagnosis of  By: Jess Barters MD, [name]    . Chronic lower back pain   . Congestive heart failure (CHF) ([place])   . COPD (chronic obstructive pulmonary disease) ([place]) 09/23/2010   Diagnosed at [facility] in 2008 (Dr. Annamaria Boots)   . DM (diabetes mellitus) type II controlled with renal manifestation ([place]) 08/31/2006   Qualifier: Diagnosis of  By: Dorathy Daft MD, Marjory Lies    . Dyspnea    "all my life; since 6th grade" (07/12/2017)  . Fatty liver   . HYPERCHOLESTEROLEMIA 08/31/2006   Intolerance to Lipitor OK on Crestor but medicaid no longer covering    . HYPERTENSION, BENIGN SYSTEMIC 08/31/2006   Qualifier: Diagnosis of  By: Dorathy Daft MD, Marjory Lies    . Hypokalemia   . HYPOTHYROIDISM, UNSPECIFIED 08/31/2006   Qualifier: Diagnosis of  By: Dorathy Daft MD, Marjory Lies    . Leg swelling 03/14/2018  . Pneumonia    "3 times" (07/12/2017)  . Pulmonary nodule   .  Renal cyst   . Schizophrenia ([place])   . Scoliosis   . Stomach problems   . Thyroid disorder     Patient Active Problem List   Diagnosis Date Noted  . Bipolar I disorder, most recent episode depressed, severe w psychosis ([place])   . Dementia associated with other underlying disease with behavioral disturbance ([place]) 03/27/2020  . GERD (gastroesophageal reflux disease) 12/27/2019  . Post-menopausal atrophic vaginitis 12/13/2019  . Vagina, candidiasis 12/10/2019  . Vaginal itching 11/10/2019  . Dysphagia 07/02/2019  . Screening examination for STD (sexually transmitted disease) 03/25/2019  . 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 Other chronic pain 12/03/2018  . Dizziness of unknown cause 08/04/2018  . Tremor 07/15/2018  . Atrial flutter with rapid ventricular response ([place]) 07/10/2017  . Atrial fibrillation ([place]) 07/05/2017  . COPD (chronic obstructive pulmonary disease) ([place]) 01/22/2017  . Pedal edema   . Chronic bilateral low back pain without sciatica 08/15/2016  . 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 Schizoaffective disorder ([place])   . Hypokalemia 01/22/2015  . Osteoarthritis, multiple sites 06/08/2011  . Dysuria 02/14/2011  . CHRONIC KIDNEY DISEASE STAGE II (MILD) 09/14/2009  . Hypothyroidism 08/31/2006  . Type 2 diabetes mellitus ([place]) 08/31/2006  . HYPERCHOLESTEROLEMIA 08/31/2006  . Tobacco abuse 08/31/2006  . HYPERTENSION, BENIGN SYSTEMIC 08/31/2006    Past Surgical History:  Procedure Laterality Date  . CESAREAN SECTION    . FOOT  FRACTURE SURGERY Right    "steel plate in it"  . FOOT SURGERY     " born w/dislocated foot"  . FRACTURE SURGERY    . KNEE ARTHROSCOPY Right   . TOE SURGERY Bilateral    "both pinky toes"  . TONSILLECTOMY AND ADENOIDECTOMY       OB History   No obstetric history on file.     Family History  Problem Relation Age of Onset  . Breast cancer Sister   . Heart disease Mother 40  . Rectal  cancer Mother   . Diabetes Father 81  . Hypertension Father   . Heart disease Brother   . Asthma Daughter   . Asthma Son   . Breast cancer Sister   . Asthma Daughter   . Colon cancer Maternal Grandmother   . Stomach cancer Neg Hx   . Esophageal cancer Neg Hx   . Pancreatic cancer Neg Hx     Social History   Tobacco Use  . Smoking status: Current Some Day Smoker    Packs/day: 0.25    Years: 45.00    Pack years: 11.25    Types: Cigarettes  . Smokeless tobacco: Never Used  . Tobacco comment: patient states she smokes 1 cigarette per month  Vaping Use  . Vaping Use: Never used  Substance Use Topics  . Alcohol use: Not Currently    Comment: 07/12/2017 "stopped 2 yr ago; did have a drink over the holidays recently"  . Drug use: No    Home Medications Prior to Admission medications   Medication Sig Start Date End Date Taking? Authorizing Provider  acetaminophen (TYLENOL) 500 MG tablet Take 2 tablets (1,000 mg total) by mouth in the morning and at bedtime. And 500 mg tablet daily at 2 pm 04/28/20   Ngetich, [name] C, NP  amLODipine (NORVASC) 5 MG tablet Take 1 tablet (5 mg total) by mouth daily. 08/07/20   Ngetich, [name] C, NP  Blood Glucose Monitoring Suppl (ONETOUCH VERIO) w/Device KIT Use to test blood sugar once daily. Dx: E11.9 06/01/20   Ngetich, Nelda Bucks, NP  Blood Pressure Monitoring (BLOOD PRESSURE MONITOR/WRIST) KIT 1 Units by Does not apply route daily at 6 (six) AM. 05/26/20   Ngetich, [name] C, NP  estradiol (ESTRACE) 0.1 MG/GM vaginal cream Place 1 Applicatorful vaginally at bedtime. 05/26/20   Ngetich, [name] C, NP  Fluticasone-Umeclidin-Vilant 100-62.5-25 MCG/INH AEPB Inhale 100 mcg into the lungs daily. 08/07/20   Ngetich, [name] C, NP  glucose blood (ONETOUCH VERIO) test strip Use to test blood sugar once daily. Dx: E11.9 07/03/20   Ngetich, [name] C, NP  glucose blood test strip One Touch Ultra 2 Test Strips Use to test blood sugar once daily. Dx: E11.9 08/25/20   Ngetich,  [name] C, NP  hydrochlorothiazide (HYDRODIURIL) 25 MG tablet Take one tablet by mouth once daily. 08/07/20   Ngetich, [name] C, NP  HYDROcodone-acetaminophen (NORCO/VICODIN) 5-325 MG tablet Take 1 tablet by mouth every 4 (four) hours as needed for moderate pain. 06/11/20   Newt Minion, MD  ibuprofen (ADVIL) 800 MG tablet Take 1 tablet (800 mg total) by mouth every 8 (eight) hours as needed. 08/13/20   Newt Minion, MD  JANUVIA 25 MG tablet Take 1 tablet (25 mg total) by mouth daily. 08/07/20   Ngetich, [name] C, NP  Lancets (ONETOUCH ULTRASOFT) lancets Use to test blood sugar once daily. Dx: E11.9 07/03/20   Ngetich, Nelda Bucks, NP  levothyroxine (SYNTHROID) 25 MCG tablet TAKE  1 TABLET(25 MCG) BY MOUTH DAILY BEFORE BREAKFAST 08/27/20   Ngetich, [name] C, NP  LORazepam (ATIVAN) 0.5 MG tablet Take 0.5 mg by mouth daily as needed. 05/21/20   [provider]  metFORMIN (GLUCOPHAGE) 1000 MG tablet Take one tablet by mouth twice daily with a meal. 08/07/20   Ngetich, [name] C, NP  methylPREDNISolone (MEDROL) 4 MG tablet Take 0.5 tablets (2 mg total) by mouth daily. 07/25/20   Jessy Oto, MD  metoprolol succinate (TOPROL-XL) 50 MG 24 hr tablet Take 1 tablet (50 mg total) by mouth daily. Take with or immediately following a meal. 08/07/20   Ngetich, [name] C, NP  omeprazole (PRILOSEC) 40 MG capsule Take one capsule by mouth once daily. 08/13/20   Pyrtle, Lajuan Lines, MD  potassium chloride (KLOR-CON) 10 MEQ tablet Take one tablet by mouth once daily. 08/07/20   Ngetich, [name] C, NP  potassium chloride SA (KLOR-CON M20) 20 MEQ tablet Take 2 tablets (40 mEq total) by mouth 2 (two) times daily for 1 day. 04/29/20 04/30/20  Medina-Vargas, Monina C, NP  rosuvastatin (CRESTOR) 20 MG tablet Take one tablet by mouth once daily. 08/21/20   Ngetich, [name] C, NP  sucralfate (CARAFATE) 1 GM/10ML suspension SHAKE LIQUID AND TAKE 10 ML(1 GRAM) BY MOUTH FOUR TIMES DAILY 08/13/20   Pyrtle, Lajuan Lines, MD    Allergies    Citrus, Fish  allergy, Shellfish allergy, Adhesive [tape], Ibuprofen, Latex, Lipitor [atorvastatin calcium], Lisinopril, Other, Codeine, and Tramadol  Review of Systems   Review of Systems  Unable to perform ROS: Psychiatric disorder    Physical Exam Updated Vital Signs BP 127/75 (BP Location: Right Arm)   Pulse (!) 55   Temp 97.7 F (36.5 C) (Oral)   Resp 16   SpO2 100%   Physical Exam Vitals and nursing note reviewed.  Constitutional:      General: She is not in acute distress.    Appearance: She is well-developed and well-nourished.     Comments: Patient is cooperative, interactive, pleasant, smiling until being asked to perform additional elements of the medical clearance routine, including fluid provision given initially notable elevated creatinine.  HENT:     Head: Normocephalic and atraumatic.  Eyes:     Extraocular Movements: EOM normal.     Conjunctiva/sclera: Conjunctivae normal.  Cardiovascular:     Rate and Rhythm: Normal rate and regular rhythm.  Pulmonary:     Effort: Pulmonary effort is normal. No respiratory distress.     Breath sounds: Normal breath sounds. No stridor.  Abdominal:     General: There is no distension.  Musculoskeletal:        General: No edema.  Skin:    General: Skin is warm and dry.  Neurological:     Mental Status: She is alert and oriented to person, place, and time.     Cranial Nerves: No cranial nerve deficit.  Psychiatric:        Mood and Affect: Mood and affect normal.     ED Results / Procedures / Treatments   Labs (all labs ordered are listed, but only abnormal results are displayed) Labs Reviewed  COMPREHENSIVE METABOLIC PANEL - Abnormal; Notable for the following components:      Result Value   Sodium 134 (*)    Potassium 2.5 (*)    Glucose, Bld 180 (*)    BUN 38 (*)    Creatinine, Ser 1.99 (*)    Albumin 3.2 (*)    GFR, Estimated 27 (*)  All other components within normal limits  SALICYLATE LEVEL - Abnormal; Notable for the  following components:   Salicylate Lvl <4.6 (*)    All other components within normal limits  ACETAMINOPHEN LEVEL - Abnormal; Notable for the following components:   Acetaminophen (Tylenol), Serum <10 (*)    All other components within normal limits  CBC - Abnormal; Notable for the following components:   RBC 3.72 (*)    Hemoglobin 11.8 (*)    HCT 35.0 (*)    All other components within normal limits  ETHANOL  RAPID URINE DRUG SCREEN, HOSP PERFORMED  I-STAT CHEM 8, ED   Procedures Procedures   Medications Ordered in ED Medications  amLODipine (NORVASC) tablet 5 mg (5 mg Oral Not Given 09/02/20 2319)  hydrochlorothiazide (HYDRODIURIL) tablet 25 mg (has no administration in time range)  linagliptin (TRADJENTA) tablet 5 mg (5 mg Oral Not Given 09/02/20 2320)  levothyroxine (SYNTHROID) tablet 25 mcg (has no administration in time range)  LORazepam (ATIVAN) tablet 0.5 mg (has no administration in time range)  metFORMIN (GLUCOPHAGE) tablet 1,000 mg (1,000 mg Oral Not Given 09/02/20 2320)  metoprolol succinate (TOPROL-XL) 24 hr tablet 50 mg (50 mg Oral Not Given 09/02/20 2321)  pantoprazole (PROTONIX) EC tablet 40 mg (40 mg Oral Not Given 09/02/20 2321)  potassium chloride (KLOR-CON) CR tablet 10 mEq (10 mEq Oral Not Given 09/02/20 2322)  rosuvastatin (CRESTOR) tablet 20 mg (20 mg Oral Not Given 09/02/20 2321)  fluticasone furoate-vilanterol (BREO ELLIPTA) 100-25 MCG/INH 1 puff (has no administration in time range)    And  umeclidinium bromide (INCRUSE ELLIPTA) 62.5 MCG/INH 1 puff (has no administration in time range)  0.9 % NaCl with KCl 20 mEq/ L  infusion ( Intravenous Stopped 09/02/20 1830)    ED Course  I have reviewed the triage vital signs and the nursing notes.  Pertinent labs & imaging results that were available during my care of the patient were reviewed by me and considered in my medical decision making (see chart for details).      Elderly female presents from home under involuntary  commitment due to hallucinations, atypical behavior, possible medication noncompliance. Initial findings notable for worsening of her baseline renal dysfunction, as well as hyponatremia.  Line patient's labs otherwise reassuring, physical exam reassuring, vital signs reassuring.  Line patient will require fluid resuscitation, potassium repletion, may subsequently be cleared for behavioral evaluation.  Update:, Patient ambulatory, in no distress.  On she has received fluids with potassium repletion, has had ordering of her home meds. She did have some abnormalities of her electrolytes on arrival, but has had hypokalemia in the past, has no renal dysfunction, no today's notes were mildly worse.  On however, with fluid resuscitation, absence of distress, no hemodynamic instability, patient was medically cleared for behavioral health evaluation.  Final Clinical Impression(s) / ED Diagnoses Final diagnoses:  Paranoia ([place])  Agitation     [name], MD 09/02/20 2324

## 2020-09-02 NOTE — Telephone Encounter (Signed)
Pt would like a refill on her pain medication.  Pt also would like prednisone sent into the pharm due to the fact she didn't get the last rx for it that was sent in. Pt states somebody else picked it up

## 2020-09-02 NOTE — ED Notes (Signed)
IVC paperwork given to oncoming RN

## 2020-09-02 NOTE — Telephone Encounter (Signed)
FY  I called the pt and she states that if she does not get her rx by this evening that she is going to come up here and deal with me personally. Patient states " I am sick of your shit" and I can go F myself. Patient states that she wants Dr. Sharol Given to call her back personally and I advised that he is not able to do that he is in the OR. I also advised the pt that she has an appt sch for 09/10/20 and that she can discuss this with him at that time. The pt states that she is going to have my job for this and I should just shut up because she is tired of me and my shit. She states that she is going to make sure that I am fired and that she is going to get what she wants. I advised the patient that she can discuss all of this with Dr. Sharol Given when she comes in for her appt and disconnected the cal. The pt was on speaker and the PA was present for the conversation.

## 2020-09-02 NOTE — Telephone Encounter (Signed)
I tried to call pt no answer. Will hold and try again. Pt had appt today and cx. Per PA will not give rx for pain medication or prednisone.

## 2020-09-03 DIAGNOSIS — F23 Brief psychotic disorder: Secondary | ICD-10-CM | POA: Diagnosis not present

## 2020-09-03 DIAGNOSIS — R451 Restlessness and agitation: Secondary | ICD-10-CM | POA: Diagnosis not present

## 2020-09-03 DIAGNOSIS — F22 Delusional disorders: Secondary | ICD-10-CM | POA: Diagnosis not present

## 2020-09-03 LAB — I-STAT CHEM 8, ED
BUN: 29 mg/dL — ABNORMAL HIGH (ref 8–23)
Calcium, Ion: 1.21 mmol/L (ref 1.15–1.40)
Chloride: 101 mmol/L (ref 98–111)
Creatinine, Ser: 1.4 mg/dL — ABNORMAL HIGH (ref 0.44–1.00)
Glucose, Bld: 137 mg/dL — ABNORMAL HIGH (ref 70–99)
HCT: 35 % — ABNORMAL LOW (ref 36.0–46.0)
Hemoglobin: 11.9 g/dL — ABNORMAL LOW (ref 12.0–15.0)
Potassium: 2.7 mmol/L — CL (ref 3.5–5.1)
Sodium: 140 mmol/L (ref 135–145)
TCO2: 27 mmol/L (ref 22–32)

## 2020-09-03 MED ORDER — SUCRALFATE 1 GM/10ML PO SUSP
1.0000 g | Freq: Three times a day (TID) | ORAL | Status: DC
  Administered 2020-09-03: 1 g via ORAL
  Filled 2020-09-03 (×6): qty 10

## 2020-09-03 MED ORDER — SUCRALFATE 1 G PO TABS
1.0000 g | ORAL_TABLET | Freq: Three times a day (TID) | ORAL | Status: DC
  Filled 2020-09-03: qty 1

## 2020-09-03 MED ORDER — POTASSIUM CHLORIDE CRYS ER 20 MEQ PO TBCR
40.0000 meq | EXTENDED_RELEASE_TABLET | Freq: Once | ORAL | Status: AC
  Administered 2020-09-03: 40 meq via ORAL
  Filled 2020-09-03: qty 2

## 2020-09-03 MED ORDER — POTASSIUM CHLORIDE CRYS ER 20 MEQ PO TBCR
20.0000 meq | EXTENDED_RELEASE_TABLET | Freq: Two times a day (BID) | ORAL | Status: DC
  Administered 2020-09-03 – 2020-09-07 (×6): 20 meq via ORAL
  Filled 2020-09-03 (×7): qty 1

## 2020-09-03 NOTE — ED Notes (Signed)
TTS in process 

## 2020-09-03 NOTE — ED Notes (Signed)
Pt informed her BP was 106/72  and the plan is to recheck BP in several hrs . Pt refused to take any other meds because she was no getting her BP meds.

## 2020-09-03 NOTE — ED Notes (Signed)
Spoke with daughters and updated them on status. Pt spoke with daughters on phone. Pt calm and cooperative at this time. Bag lunch given.

## 2020-09-03 NOTE — ED Provider Notes (Addendum)
Emergency Medicine Observation Re-evaluation Note  [name] Long is a 70 y.o. female, seen on rounds today.  Pt initially presented to the ED for complaints of Hallucinations Currently, the patient is standing in the hall talking on the phone.  She has apparently been doing this repeatedly.  Physical Exam  BP 106/72 (BP Location: Left Arm)   Pulse 78   Temp 98.3 F (36.8 C) (Oral)   Resp 18   SpO2 100%  Physical Exam General: Calm Cardiac: Normal heart rate Lungs: Normal respiratory rate Psych: Mildly agitated and anxious.  No clear evidence for internal responsiveness.  ED Course / MDM  EKG:    I have reviewed the labs performed to date as well as medications administered while in observation.  Recent changes in the last 24 hours include she has remained calm and cooperative, pending TTS evaluation.  Blood pressure borderline, will hold morning blood pressure medicine and reassess periodically before initiating further treatment for hypertension.  Plan  Current plan is for psychiatric evaluation. Patient is under full IVC at this time.      Daleen Bo, MD 09/03/20 1018

## 2020-09-03 NOTE — ED Notes (Signed)
Pt reports she wants her PCP call for any orders because she does not like the hospital DR. Pt instructed PT her PCP could not be called for medication orders while she was in the hospital.

## 2020-09-03 NOTE — ED Notes (Signed)
Pt at desk to call [facility].

## 2020-09-03 NOTE — ED Notes (Signed)
Josette Shimabukuro is pts legal guardian. Please contact her after TTS for her input at [phone].

## 2020-09-03 NOTE — BH Assessment (Signed)
Clinician spoke to [facility], to set TTS cart for pt.  Clinician to call pt cart in 10 min's.

## 2020-09-04 DIAGNOSIS — F22 Delusional disorders: Secondary | ICD-10-CM | POA: Diagnosis not present

## 2020-09-04 DIAGNOSIS — F23 Brief psychotic disorder: Secondary | ICD-10-CM | POA: Diagnosis not present

## 2020-09-04 DIAGNOSIS — R451 Restlessness and agitation: Secondary | ICD-10-CM | POA: Diagnosis not present

## 2020-09-04 LAB — BASIC METABOLIC PANEL
Anion gap: 9 (ref 5–15)
BUN: 18 mg/dL (ref 8–23)
CO2: 26 mmol/L (ref 22–32)
Calcium: 9.5 mg/dL (ref 8.9–10.3)
Chloride: 97 mmol/L — ABNORMAL LOW (ref 98–111)
Creatinine, Ser: 1.27 mg/dL — ABNORMAL HIGH (ref 0.44–1.00)
GFR, Estimated: 46 mL/min — ABNORMAL LOW (ref >60)
Glucose, Bld: 118 mg/dL — ABNORMAL HIGH (ref 70–99)
Potassium: 3 mmol/L — ABNORMAL LOW (ref 3.5–5.1)
Sodium: 132 mmol/L — ABNORMAL LOW (ref 135–145)

## 2020-09-04 LAB — RESP PANEL BY RT-PCR (FLU A&B, COVID) ARPGX2
Influenza A by PCR: NEGATIVE
Influenza B by PCR: NEGATIVE
SARS Coronavirus 2 by RT PCR: NEGATIVE

## 2020-09-04 LAB — RAPID URINE DRUG SCREEN, HOSP PERFORMED
Amphetamines: NOT DETECTED
Barbiturates: NOT DETECTED
Benzodiazepines: NOT DETECTED
Cocaine: NOT DETECTED
Opiates: NOT DETECTED
Tetrahydrocannabinol: NOT DETECTED

## 2020-09-04 LAB — CBG MONITORING, ED: Glucose-Capillary: 130 mg/dL — ABNORMAL HIGH (ref 70–99)

## 2020-09-04 LAB — MAGNESIUM: Magnesium: 1.7 mg/dL (ref 1.7–2.4)

## 2020-09-04 MED ORDER — ACETAMINOPHEN 325 MG PO TABS
650.0000 mg | ORAL_TABLET | Freq: Four times a day (QID) | ORAL | Status: AC | PRN
  Administered 2020-09-04 – 2020-09-06 (×4): 650 mg via ORAL
  Filled 2020-09-04 (×4): qty 2

## 2020-09-04 NOTE — ED Notes (Signed)
Patient up to the nursing station to make first allowed phone call at 0800. When informed that she only gets 2 calls a day for 5 minutes each, she asked for a copy of the rules sheet. Copy provided and patient then made a second call at 0830. Patient told the person she called that she would "fuck them up" if they didn't get her out of here. Patient slammed down phone and went back to her room. Ambulating with steady gait.
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
## 2020-09-04 NOTE — ED Notes (Signed)
Lunch Tray Ordered @ 1034. 

## 2020-09-04 NOTE — ED Provider Notes (Signed)
Emergency Medicine Observation Re-evaluation Note  [name] Long is a 70 y.o. female, seen on rounds today.  Pt initially presented to the ED for complaints of Hallucinations Currently, the patient is resting comfortably without complaints.  Patient has noted hypokalemia of 2.7, increasing creatinine of 1.99 from 1.4.  Patient is currently taking 40 mEq daily of potassium, will repeat potassium and magnesium for further evaluation.  Patient is currently awaiting geriatric psych placement.  Patient is under IVC, home meds ordered  16:14 repeat BMP shows improvement of potassium 3 and improved creatinine of 1.2.  Magnesium 1.7.  Physical Exam  BP 123/77 (BP Location: Right Arm)   Pulse 64   Temp 97.9 F (36.6 C) (Oral)   Resp 17   SpO2 99%  Physical Exam General: Resting comfortably. Lungs: Nonlabored breathing Psych: Responding appropriately.  ED Course / MDM  EKG:    I have reviewed the labs performed to date as well as medications administered while in observation.  Recent changes in the last 24 hours include improvement of creatinine and potassium, normal magnesium.  Plan  Current plan is for bed placement at geriatric psych facility as patient meets in patient psych criteria. Patient is under full IVC at this time.   Marcello Fennel, PA-C 09/04/20 1617    Hayden Rasmussen, MD 09/04/20 [phone]

## 2020-09-04 NOTE — Care Management (Signed)
Writer referred patient to the following facilities for [place] psych placement:   [facility], [facility], [facility], [facility], [facility], [place], [facility], [place], [facility], [facility], [facility], [facility], [facility], [facility], [facility]

## 2020-09-04 NOTE — ED Notes (Signed)
Patient with great rapport with her sitter and RN. Patient smiling and laughing with sitter Pamala Duffel when she calls her Fritzi Mandes. Patient cooperated with Covid swab, phlebotomy collection, and vital sign collection with dancing party in her room and [facility] playing/singing. Patient in very good mood at this time. Noted improvement from the hostile mood from this morning. Will continue to assess and monitor.
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
## 2020-09-04 NOTE — BH Assessment (Incomplete)
Comprehensive Clinical Assessment (CCA) Note  09/04/2020 [name] Long [id number]  Chief Complaint:  Chief Complaint  Patient presents with  . 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  Pt's daughter, [name] Long [phone], participated in the assessment.  IVC reads " respondent is hearing voices and hallucinating.  She talks to herself and answers.  She stabbed the a/c unit while plugged in because the voices told her to do it. Respondent is very aggressive towards others.  She has threatened bodily harm", (unable to obtain copy of IVC). Pt reports that she has been feeling frustrated, angry, stressed and dealing with arthritis and neuropathy pain.  Pt daughter reported that she lost both her ex-husband and her father on the same day (Sept 2020); also, lost her best friend in 2021.  Pt reports that she is unable to sleep, unless she is taken pain medication; also reports that she have lost 13 lbs just recently.  Pt denies any recent manic symptoms.  Pt denies suicide ideation and prior attempts of suicidal.  Pt denies homicidal ideation.  Pt admits that she hear voices and sometimes she talks to herself "we all do that".  Pt reported that she has been taking care of five babies yesterday; also that [facility], and Denies all was after her, they wanted to change her life.  Pt denies paranoia.  Pt reports the last time she drank alchol was in June (summer) of last year 2021; also denies any use of substance. Pt reports that she does not smoke cigarettes.   Pt daughter identifies her primary stressors as transitioning to a new house and environment; also, the lost of family and friends.  Pt daughter reported during the lost of family and friends she showed no emotions "I feel that she was  numb".  Pt's daughter reports that she live in a house for many years, now she is having to relocate, which is causing sadness. Pt daughter admitted that their mother have dealth with mental illness for over thirty years, also her daughter denies the use of substance use in their family.  Pt daughter reports that she is not currently receiving weekly outpatient therapy; also, is not taking medication.  Pt daughter feel that someone provided misinformation about the medication, causing her to refused all medication.  Pt daughter reports previous inpatient psychiatric hospitalization in August 2021 in [place] for fifteen days.  Pt is dressed in scrubs, alert,oriented x 4 with normal speech and restless motor behavior.  Eye contact is good and Pt is tearful.  Pt mood is depressed and affect is sad.  Thought process is distorted.  Pt's insight is poor and judgement is fair.  Thee is no indication Pt is currently responding to internal stimuli or experiencing delusional thought content.  Pt was cooperative throughout assessment.             Disposition [name] Romp NP, patient meets in patientt criteria.  Pt will remain in ED and social worker will follow up with geriatric placement during the AM.  Disposition discussed with Monique RN via secure fchat in epic.      CCA Screening, Triage and Referral (STR)  Patient Reported Information How did you hear about [place]? Legal System  Referral name: No data recorded Referral phone number: No data recorded  Whom do  you see for routine medical problems? Primary Care  Practice/Facility Name: No data recorded Practice/Facility Phone Number: No data recorded Name of Contact: Dr. Vara Guardian Number: No data recorded Contact Fax Number: No data recorded Prescriber Name: No data recorded Prescriber Address (if known): No data recorded  What Is the Reason for Your Visit/Call Today? No data recorded How Long Has This Been Causing You Problems?  > than 6 months  What Do You Feel Would Help You the Most Today? -- (UTA)   Have You Recently Been in Any Inpatient Treatment (Hospital/Detox/Crisis Center/28-Day Program)? No (Pt daughter reports that she was at [facility] for 15 days in August 2021)  Name/Location of Program/Hospital:No data recorded How Long Were You There? No data recorded When Were You Discharged? No data recorded  Have You Ever Received Services From [facility] Before? Yes  Who Do You See at [facility]? 08/2019   Have You Recently Had Any Thoughts About Hurting Yourself? No  Are You Planning to Commit Suicide/Harm Yourself At This time? No   Have you Recently Had Thoughts About [place]? No  Explanation: No data recorded  Have You Used Any Alcohol or Drugs in the Past 24 Hours? No  How Long Ago Did You Use Drugs or Alcohol? No data recorded What Did You Use and How Much? No data recorded  Do You Currently Have a Therapist/Psychiatrist? Yes  Name of Therapist/Psychiatrist: [facility] Recently Discharged From Any Office Practice or Programs? No  Explanation of Discharge From Practice/Program: No data recorded    CCA Screening Triage Referral Assessment Type of Contact: Tele-Assessment  Is this Initial or Reassessment? Initial Assessment  Date Telepsych consult ordered in [facility]:  09/03/2020  Time Telepsych consult ordered in [facility]:  [place]   Patient Reported Information Reviewed? Yes  Patient Left Without Being Seen? No data recorded Reason for Not Completing Assessment: No data recorded  Collateral Involvement: [name] Long, daughter, [phone]   Does Patient Have a [place]? No data recorded Name and Contact of Legal Guardian: No data recorded If Minor and Not Living with Parent(s), Who has Custody? n/a  Is CPS involved or ever been involved? Never  Is APS involved or ever been involved? Currently   Patient  Determined To Be At Risk for Harm To Self or Others Based on Review of Patient Reported Information or Presenting Complaint? No (Pt daughter reported that she is not taking medication; also when she become angry she destroy objects in the house.)  Method: No data recorded Availability of Means: No data recorded Intent: No data recorded Notification Required: No data recorded Additional Information for Danger to Others Potential: No data recorded Additional Comments for Danger to Others Potential: No data recorded Are There Guns or Other Weapons in Your Home? No data recorded Types of Guns/Weapons: No data recorded Are These Weapons Safely Secured?                            No data recorded Who Could Verify You Are Able To Have These Secured: No data recorded Do You Have any Outstanding Charges, Pending Court Dates, Parole/Probation? No data recorded Contacted To Inform of Risk of Harm To Self or Others: Law Enforcement   Location of Assessment: [facility] ED   Does Patient Present under Involuntary Commitment? Yes  IVC Papers Initial File Date: 09/03/2020   [place] of Residence: [place]  Patient Currently Receiving the Following Services: Not Receiving Services   Determination of Need: No data recorded  Options For Referral: -- (UTA)     CCA Biopsychosocial Intake/Chief Complaint:  Hallucination  Current Symptoms/Problems: hearing voices   Patient Reported Schizophrenia/Schizoaffective Diagnosis in Past: Yes   Strengths: UTA  Preferences: UTA  Abilities: UTA   Type of Services Patient Feels are Needed: UTA   Initial Clinical Notes/Concerns: No data recorded  Mental Health Symptoms Depression:  Change in energy/activity; Fatigue   Duration of Depressive symptoms: No data recorded  Mania:  Irritability; Recklessness   Anxiety:   Restlessness; Sleep   Psychosis:  Other negative symptoms; Hallucinations   Duration of Psychotic symptoms: Greater than six  months   Trauma:  None   Obsessions:  None   Compulsions:  None   Inattention:  Disorganized; Forgetful; Does not seem to listen   Hyperactivity/Impulsivity:  Feeling of restlessness; Fidgets with hands/feet   Oppositional/Defiant Behaviors:  None   Emotional Irregularity:  Transient, stress-related paranoia/disassociation   Other Mood/Personality Symptoms:  No data recorded   Mental Status Exam Appearance and self-care  Stature:  Average   Weight:  Average weight   Clothing:  -- (Pt dressed in scrubs)   Grooming:  Normal   Cosmetic use:  Age appropriate   Posture/gait:  Normal   Motor activity:  Agitated; Restless   Sensorium  Attention:  Confused; Distractible   Concentration:  Preoccupied; Scattered   Orientation:  Object; Person; Place   Recall/memory:  Defective in Short-term; Defective in Immediate   Affect and Mood  Affect:  Not Congruent   Mood:  Negative   Relating  Eye contact:  None   Facial expression:  Sad   Attitude toward examiner:  Cooperative   Thought and Language  Speech flow: Slow   Thought content:  Ideas of Reference; Suspicious   Preoccupation:  Ruminations   Hallucinations:  Auditory (Pt daughter reports that sheis destroying items)   Organization:  No data recorded  [employer] of Knowledge:  Fair   Intelligence:  Needs investigation   Abstraction:  Normal   Judgement:  Fair   Art therapist:  Distorted   Insight:  Poor   Decision Making:  Confused   Social Functioning  Social Maturity:  Isolates   Social Judgement:  -- Special educational needs teacher)   Stress  Stressors:  Family conflict; Housing   Coping Ability:  Overwhelmed   Skill Deficits:  Self-care; Environmental health practitioner; Communication   Supports:  Friends/Service system     Religion: Religion/Spirituality Are You A Religious Person?:  (UTA) How Might This Affect Treatment?: UTA  Leisure/Recreation: Leisure / Recreation Do You Have Hobbies?:  Yes Leisure and Hobbies: Pt daughter reports that she enjoys listenig to music and dancing  Exercise/Diet: Exercise/Diet Do You Exercise?:  (UTA) Have You Gained or Lost A Significant Amount of Weight in the Past Six Months?: Yes-Lost Number of Pounds Lost?: 13 Do You Follow a Special Diet?:  (Pt reports that she has lost her appetite) Do You Have Any Trouble Sleeping?: Yes Explanation of Sleeping Difficulties: Pt reports "I have to take my pain medication, so I can sleep"   CCA Employment/Education Employment/Work Situation: Employment / Work Situation Employment situation: Unemployed Patient's job has been impacted by current illness: No What is the longest time patient has a held a job?: n/a Where was the patient employed at that time?: n/a Has patient ever been in the [employer]?: No  Education: Education Is Patient  Currently Attending School?: No Last Grade Completed: 12 Name of High School: [employer] Did You Graduate From [employer]?: Yes Did You Attend College?: No Did [place]?: No Did You Have An Individualized Education Program (IIEP):  (UTA) Did You Have Any Difficulty At School?:  (UTA) Patient's Education Has Been Impacted by Current Illness:  (UTA)   CCA Family/Childhood History Family and Relationship History: Family history Marital status: Widowed Widowed, when?: August 2021 Are you sexually active?:  (UTA) What is your sexual orientation?: UTA Has your sexual activity been affected by drugs, alcohol, medication, or emotional stress?: UTA Does patient have children?: Yes How many children?: 4 How is patient's relationship with their children?: close  Childhood History:     Child/Adolescent Assessment:     CCA Substance Use Alcohol/Drug Use: Alcohol / Drug Use Pain Medications: See MRA Prescriptions: See MRA Over the Counter: See MRA History of alcohol / drug use?: No history of alcohol / drug abuse                          ASAM's:  Six Dimensions of Multidimensional Assessment  Dimension 1:  Acute Intoxication and/or Withdrawal Potential:      Dimension 2:  Biomedical Conditions and Complications:      Dimension 3:  Emotional, Behavioral, or Cognitive Conditions and Complications:     Dimension 4:  Readiness to Change:     Dimension 5:  Relapse, Continued use, or Continued Problem Potential:     Dimension 6:  Recovery/Living Environment:     ASAM Severity Score:    ASAM Recommended Level of Treatment:     Substance use Disorder (SUD)    Recommendations for Services/Supports/Treatments:    DSM5 Diagnoses: Patient Active Problem List   Diagnosis Date Noted  . Bipolar I disorder, most recent episode depressed, severe w psychosis ([place])   . Dementia associated with other underlying disease with behavioral disturbance ([place]) 03/27/2020  . GERD (gastroesophageal reflux disease) 12/27/2019  . Post-menopausal atrophic vaginitis 12/13/2019  . Vagina, candidiasis 12/10/2019  . Vaginal itching 11/10/2019  . Dysphagia 07/02/2019  . Screening examination for STD (sexually transmitted disease) 03/25/2019  . 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 Other chronic pain 12/03/2018  . Dizziness of unknown cause 08/04/2018  . Tremor 07/15/2018  . Atrial flutter with rapid ventricular response ([place]) 07/10/2017  . Atrial fibrillation ([place]) 07/05/2017  . COPD (chronic obstructive pulmonary disease) ([place]) 01/22/2017  . Pedal edema   . Chronic bilateral low back pain without sciatica 08/15/2016  . 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 Schizoaffective disorder ([place])   . Hypokalemia 01/22/2015  . Osteoarthritis, multiple sites 06/08/2011  . Dysuria 02/14/2011  . CHRONIC KIDNEY DISEASE STAGE II (MILD) 09/14/2009  . Hypothyroidism 08/31/2006  . Type 2 diabetes mellitus ([place]) 08/31/2006  . HYPERCHOLESTEROLEMIA 08/31/2006  . Tobacco abuse 08/31/2006  .  HYPERTENSION, BENIGN SYSTEMIC 08/31/2006    Patient Centered Plan: Patient is on the following Treatment Plan(s):  {[facility] AMB BH OP Treatment Plans:[id number]}   Referrals to Alternative Service(s): Referred to Alternative Service(s):   Place:   Date:   Time:    Referred to Alternative Service(s):   Place:   Date:   Time:    Referred to Alternative Service(s):   Place:   Date:   Time:    Referred to Alternative Service(s):   Place:   Date:   Time:  Leonides Schanz, Social worker

## 2020-09-04 NOTE — ED Notes (Signed)
Patient up to the nursing station asking to have her blood sugar checked. CBG done. Patient also asking for her drops in her purse. [place] drops administered and returned to her purse in locker. Patient continues to be in good mood.

## 2020-09-05 DIAGNOSIS — F23 Brief psychotic disorder: Secondary | ICD-10-CM | POA: Diagnosis not present

## 2020-09-05 DIAGNOSIS — F22 Delusional disorders: Secondary | ICD-10-CM | POA: Diagnosis not present

## 2020-09-05 DIAGNOSIS — R451 Restlessness and agitation: Secondary | ICD-10-CM | POA: Diagnosis not present

## 2020-09-05 LAB — CBG MONITORING, ED: Glucose-Capillary: 147 mg/dL — ABNORMAL HIGH (ref 70–99)

## 2020-09-05 MED ORDER — METHOCARBAMOL 500 MG PO TABS
500.0000 mg | ORAL_TABLET | Freq: Every day | ORAL | Status: AC | PRN
  Administered 2020-09-05 – 2020-09-07 (×3): 500 mg via ORAL
  Filled 2020-09-05 (×4): qty 1

## 2020-09-05 MED ORDER — OXYCODONE-ACETAMINOPHEN 5-325 MG PO TABS
1.0000 | ORAL_TABLET | Freq: Once | ORAL | Status: AC
Start: 2020-09-05 — End: 2020-09-05
  Administered 2020-09-05: 1 via ORAL
  Filled 2020-09-05: qty 1

## 2020-09-05 NOTE — ED Notes (Signed)
  Patient came out to the desk stating that she would like to go home.  Patient claims that there is an endoscopy tube in her body being controlled by her sister and Dr. Hilarie Fredrickson.  Attempted to redirect patient and offered medication to help her rest, but she refused.  Patient upset but back in her room.

## 2020-09-05 NOTE — ED Notes (Signed)
Breakfast Ordered 

## 2020-09-05 NOTE — ED Notes (Signed)
Patient resting at this time.

## 2020-09-05 NOTE — Progress Notes (Signed)
Pt continues to meet inpatient criteria. CSW contacted [facility] who requested that referral be resent. Referrals have been re-faxed to the following facilities:   [facility]   TTS/Dispostioin CSW continuing to assess for disposition/placement needs.   Malanie Koloski S. Ouida Sills, MSW, LCSW Clinical Social Worker 09/05/2020 9:59 AM

## 2020-09-05 NOTE — BH Assessment (Signed)
BH Reassessment:  Per initial assessment: "[name] Long is a 70 years old female patient involuntarily to [place] via Event organiser and accompanied by herself. Pt's daughter, [name] [phone], participated in the assessment. IVC reads " respondent is hearing voices and hallucinating. She talks to herself and answers. She stabbed the a/c unit while plugged in because the voices told her to do it. Respondent is very aggressive towards others. She has threatened bodily harm", (unable to obtain copy of IVC).Pt reports that she has been feeling frustrated, angry, stressedand dealing with arthritis and neuropathy pain. Pt daughter reported that she lost both her ex-husband and her father on the same day (Sept 2020); also, lost her best friend in 2021."  Upon assessment today, patient is calm and cooperative.  She continues to have delusions about her sister and "my sister's voodoo gang" continuing to use equipment to "suck oxygen from my head." She believes this caused a fall with associated back pain.  She states she had an x-ray and is ready to be discharged home.  Patient continued to discuss the voodoo gang presence around her house.  She states she can see them at times, but mostly "I see them when I close my eyes."  She believes her sister and the voodoo gang have been trying to harm her, as her sister has wanted the house from my aunt's estate.    LPC spoke with patient's daughter, guardian, [name] Long [phone], regarding recommendations/concerns.  She agrees that inpatient is needed, as her mother has bee off of medications for the past few months.  She is concerned that [facility] stopped Lorazepam, however did not re-start risperdal.    Disposition: Patient continues to appear psychotic and continues to meet inpatient criteria.  Disposition SW to continue to pursue inpatient geriatric psych options.   **Note - Patient's daughter/legal guardian, [name] Long, would like  to be informed of inpatient options when beds become available.  She has requested that her mother not be referred to [facility].

## 2020-09-05 NOTE — ED Notes (Signed)
PT having TTS consult.

## 2020-09-05 NOTE — ED Notes (Signed)
Pt received breakfast tray 

## 2020-09-05 NOTE — ED Provider Notes (Signed)
Emergency Medicine Observation Re-evaluation Note  [name] Long is a 70 y.o. female, seen on rounds today.  Pt initially presented to the ED for complaints of Hallucinations Currently, the patient is resting comfortably in bed.  He is asking about ibuprofen for her chronic pain.  Physical Exam  BP (!) 146/87 (BP Location: Right Wrist)   Pulse 64   Temp 97.8 F (36.6 C) (Oral)   Resp 18   SpO2 100%    CONSTITUTIONAL:  well-appearing, NAD, watching TV from her bed NEURO:  Alert and oriented x 3, no focal deficits EYES:  pupils equal and reactive ENT/NECK:  trachea midline, no JVD CARDIO:  reg rate, reg rhythm, well-perfused PULM:  None labored breathing GI/GU:  Abdomen non-distended MSK/SPINE:  No gross deformities, no edema SKIN:  no rash obvious, atraumatic, no ecchymosis  PSYCH:  Appropriate speech and behavior, amicable  ED Course / MDM  EKG:    I have reviewed the labs performed to date as well as medications administered while in observation.  Recent changes in the last 24 hours include no recent changes.  Plan  Current plan is for geriatric psychiatric placement. Patient is under full IVC at this time. I have widened the time.  That her as needed Tylenol was prescribed for for the next 4 days.  Once daily Robaxin 500 mg as needed daily for the next 2 days ordered as well.   Tedd Sias, [place] 09/05/20 1131    Luna Fuse, MD 09/05/20 2017

## 2020-09-06 DIAGNOSIS — F23 Brief psychotic disorder: Secondary | ICD-10-CM | POA: Diagnosis not present

## 2020-09-06 DIAGNOSIS — R451 Restlessness and agitation: Secondary | ICD-10-CM | POA: Diagnosis not present

## 2020-09-06 DIAGNOSIS — F22 Delusional disorders: Secondary | ICD-10-CM | POA: Diagnosis not present

## 2020-09-06 MED ORDER — SUCRALFATE 1 GM/10ML PO SUSP
1.0000 g | Freq: Three times a day (TID) | ORAL | Status: DC
  Administered 2020-09-06 – 2020-09-07 (×4): 1 g via ORAL
  Filled 2020-09-06 (×9): qty 10

## 2020-09-06 NOTE — ED Notes (Signed)
Patient ambulated to nursing desk asking about facility where she will be going. Patient then started asking where she would go if they closed. This RN reassured patient same would not happen. Patient asked for her clothes to wear for when she goes, RN advised her she will not have her belongings returned to her prior to transport. Patient argued with nurse about same, RN reinforced that patient will continue to wear scrubs per policy. Patient called RN ignorant and walked back to room.

## 2020-09-06 NOTE — ED Notes (Signed)
Pt dtr/guardian Larey Seat is picking up pt belongings except cane and sweater.

## 2020-09-06 NOTE — ED Notes (Signed)
Pt sleeping soundly. Resp even and non-labored. Will continue to monitor.  

## 2020-09-06 NOTE — ED Notes (Signed)
Several more attempts to arrange transport attempted.

## 2020-09-06 NOTE — BH Assessment (Signed)
Recommend continued inpatient. Received a call from [facility] with [facility], patient accepted to [facility] by Dr. Camie Patience for admission today. Nurse report [phone]. Nursing to arrange sheriff transport. [facility] nursing Benjamine Mola, RN), provided updates.

## 2020-09-06 NOTE — ED Notes (Signed)
Pt making phone call. 

## 2020-09-06 NOTE — ED Notes (Signed)
Report attempted at [facility] - no answer

## 2020-09-06 NOTE — BHH Counselor (Signed)
Pt remains in the ED.  Per original assessment note, "[name] Long is a 70 years old female patient involuntarily to [place] via Event organiser and accompanied by herself. Pt's daughter, Salia Cangemi [phone], participated in the assessment. IVC reads " respondent is hearing voices and hallucinating. She talks to herself and answers. She stabbed the a/c unit while plugged in because the voices told her to do it. Respondent is very aggressive towards others. She has threatened bodily harm", (unable to obtain copy of IVC).Pt reports that she has been feeling frustrated, angry, stressedand dealing with arthritis and neuropathy pain. Pt daughter reported that she lost both her ex-husband and her father on the same day (Sept 2020); also, lost her best friend in 2021."  Pt was reassessed this AM.  Thought processes were tangential in nature.  Pt spoke at length and with loose association about voodoo spells cast on her, issues she is having with the probate of a will, and visions she has and speaks because she is writing a book.  Recommend continued inpatient.

## 2020-09-06 NOTE — Discharge Instructions (Addendum)
Transfer to [facility] unit.

## 2020-09-06 NOTE — ED Notes (Signed)
Spoke with each of pt daughters. Notified her guardian of the location for inpatient care.

## 2020-09-06 NOTE — ED Notes (Signed)
Report attempted. RN at [facility] would not take report unless transportation is here to pick up pt.

## 2020-09-06 NOTE — ED Notes (Signed)
Several attempts made to reach sheriff's office to arrange transport to [facility]. At this time unable to arrange transport.

## 2020-09-06 NOTE — ED Notes (Signed)
Spoke with patient's daughter, Kinaya Hilliker, to pick up belongings tomorrow around noon. Update provided on transport to assigned facility.

## 2020-09-06 NOTE — ED Notes (Signed)
Upon entering room patient sitting on side of bed, appears to be having hallucinations as she is grabbing at things in the air. Patient c/o muscle spasms, this RN offered Robaxin along with night time meds. She refused all meds and called this RN a Control and instrumentation engineer. Patient sitting on side of stretcher in room at this time. Sitter present.

## 2020-09-06 NOTE — ED Notes (Signed)
Dinner Tray Ordered @ 1654. 

## 2020-09-06 NOTE — ED Provider Notes (Signed)
Emergency Medicine Observation Re-evaluation Note  [name] Long is a 70 y.o. female, seen on rounds today.  Pt initially presented to the ED for complaints of Hallucinations Currently, the patient is sitting on the edge of the bed, waving her hands near having a conversation with a person who is not there.  Physical Exam  BP 127/83 (BP Location: Right Arm)   Pulse 60   Temp 97.6 F (36.4 C) (Oral)   Resp 15   SpO2 99%  Physical Exam General: no distress Lungs: Equal rise and fall to chest MSK: Moves all 4 extremities Psych: Calm, talking to someone who is not present in the room.  ED Course / MDM  EKG:    I have reviewed the labs performed to date as well as medications administered while in observation.  Recent changes in the last 24 hours include none.  No overnight events  Plan  Current plan is for placement Patient is under full IVC at this time.   Nettie Elm, PA-C 09/06/20 0815    Tegeler, Gwenyth Allegra, MD 09/06/20 [phone]

## 2020-09-06 NOTE — ED Notes (Signed)
Bed at [facility] will be available until 1700 on 09/07/20.

## 2020-09-06 NOTE — ED Provider Notes (Signed)
Pt accepted at [facility] (see [place] note below):  [facility] ([place]), patient accepted to [facility] by Dr. Camie Patience for admission today. Nurse report [phone]. Nursing to arrange sheriff transport. [facility] nursing Benjamine Mola, RN), provided updates.         Electronically signed by Darcus Pester, Counselor at 09/06/2020 2:38 PM   Pt alert, content, vitals normal. Pt appears comfortable and in nad. Pt currently appears stable for transfer.      Lajean Saver, MD 09/06/20 [phone]

## 2020-09-06 NOTE — ED Notes (Signed)
Pt tried to make her 1st phone call & no one answered.

## 2020-09-07 DIAGNOSIS — E039 Hypothyroidism, unspecified: Secondary | ICD-10-CM | POA: Diagnosis not present

## 2020-09-07 DIAGNOSIS — Z7984 Long term (current) use of oral hypoglycemic drugs: Secondary | ICD-10-CM | POA: Diagnosis not present

## 2020-09-07 DIAGNOSIS — N182 Chronic kidney disease, stage 2 (mild): Secondary | ICD-10-CM | POA: Diagnosis not present

## 2020-09-07 DIAGNOSIS — M19011 Primary osteoarthritis, right shoulder: Secondary | ICD-10-CM | POA: Diagnosis not present

## 2020-09-07 DIAGNOSIS — I130 Hypertensive heart and chronic kidney disease with heart failure and stage 1 through stage 4 chronic kidney disease, or unspecified chronic kidney disease: Secondary | ICD-10-CM | POA: Diagnosis not present

## 2020-09-07 DIAGNOSIS — F250 Schizoaffective disorder, bipolar type: Secondary | ICD-10-CM | POA: Diagnosis not present

## 2020-09-07 DIAGNOSIS — F332 Major depressive disorder, recurrent severe without psychotic features: Secondary | ICD-10-CM | POA: Diagnosis not present

## 2020-09-07 DIAGNOSIS — I509 Heart failure, unspecified: Secondary | ICD-10-CM | POA: Diagnosis not present

## 2020-09-07 DIAGNOSIS — I4891 Unspecified atrial fibrillation: Secondary | ICD-10-CM | POA: Diagnosis not present

## 2020-09-07 DIAGNOSIS — K219 Gastro-esophageal reflux disease without esophagitis: Secondary | ICD-10-CM | POA: Diagnosis not present

## 2020-09-07 DIAGNOSIS — M1712 Unilateral primary osteoarthritis, left knee: Secondary | ICD-10-CM | POA: Diagnosis not present

## 2020-09-07 DIAGNOSIS — I440 Atrioventricular block, first degree: Secondary | ICD-10-CM | POA: Diagnosis not present

## 2020-09-07 DIAGNOSIS — Z20822 Contact with and (suspected) exposure to covid-19: Secondary | ICD-10-CM | POA: Diagnosis not present

## 2020-09-07 DIAGNOSIS — M1990 Unspecified osteoarthritis, unspecified site: Secondary | ICD-10-CM | POA: Diagnosis not present

## 2020-09-07 DIAGNOSIS — E785 Hyperlipidemia, unspecified: Secondary | ICD-10-CM | POA: Diagnosis not present

## 2020-09-07 DIAGNOSIS — R451 Restlessness and agitation: Secondary | ICD-10-CM | POA: Diagnosis not present

## 2020-09-07 DIAGNOSIS — G8929 Other chronic pain: Secondary | ICD-10-CM | POA: Diagnosis not present

## 2020-09-07 DIAGNOSIS — M1711 Unilateral primary osteoarthritis, right knee: Secondary | ICD-10-CM | POA: Diagnosis not present

## 2020-09-07 DIAGNOSIS — E876 Hypokalemia: Secondary | ICD-10-CM | POA: Diagnosis not present

## 2020-09-07 DIAGNOSIS — Z9114 Patient's other noncompliance with medication regimen: Secondary | ICD-10-CM | POA: Diagnosis not present

## 2020-09-07 DIAGNOSIS — F251 Schizoaffective disorder, depressive type: Secondary | ICD-10-CM | POA: Diagnosis not present

## 2020-09-07 DIAGNOSIS — K760 Fatty (change of) liver, not elsewhere classified: Secondary | ICD-10-CM | POA: Diagnosis not present

## 2020-09-07 DIAGNOSIS — M419 Scoliosis, unspecified: Secondary | ICD-10-CM | POA: Diagnosis not present

## 2020-09-07 DIAGNOSIS — J449 Chronic obstructive pulmonary disease, unspecified: Secondary | ICD-10-CM | POA: Diagnosis not present

## 2020-09-07 DIAGNOSIS — F22 Delusional disorders: Secondary | ICD-10-CM | POA: Diagnosis not present

## 2020-09-07 DIAGNOSIS — J45909 Unspecified asthma, uncomplicated: Secondary | ICD-10-CM | POA: Diagnosis not present

## 2020-09-07 DIAGNOSIS — F23 Brief psychotic disorder: Secondary | ICD-10-CM | POA: Diagnosis not present

## 2020-09-07 DIAGNOSIS — E1122 Type 2 diabetes mellitus with diabetic chronic kidney disease: Secondary | ICD-10-CM | POA: Diagnosis not present

## 2020-09-07 DIAGNOSIS — F060 Psychotic disorder with hallucinations due to known physiological condition: Secondary | ICD-10-CM | POA: Diagnosis not present

## 2020-09-07 MED ORDER — DIVALPROEX SODIUM 250 MG PO DR TAB
250.0000 mg | DELAYED_RELEASE_TABLET | Freq: Two times a day (BID) | ORAL | Status: DC
  Filled 2020-09-07: qty 1

## 2020-09-07 MED ORDER — RISPERIDONE 0.5 MG PO TABS
0.5000 mg | ORAL_TABLET | Freq: Every day | ORAL | Status: DC
  Filled 2020-09-07: qty 1

## 2020-09-07 MED ORDER — RISPERIDONE 1 MG PO TABS: 1.5000 mg | ORAL_TABLET | Freq: Every day | ORAL | Status: DC

## 2020-09-07 NOTE — ED Notes (Signed)
Pt resting comfortably in chair in her room, watching TV Calm and cooperative at this time

## 2020-09-07 NOTE — ED Provider Notes (Signed)
Emergency Medicine Observation Re-evaluation Note  [name] Long is a 70 y.o. female, seen on rounds today.  Pt initially presented to the ED for complaints of Hallucinations Currently, the patient is awaiting inpt treatment.   Plan initially was for transfer to [facility] but called today that they do not have beds now.  Physical Exam  BP 102/77 (BP Location: Right Arm)   Pulse 68   Temp (!) 97.5 F (36.4 C) (Oral)   Resp 18   SpO2 99%  Physical Exam General: Calm, cooperative Cardiac: Regular rate and rhythm Lungs: Breathing easily, no crackles or wheezes Psych: Normal mood and affect  ED Course / MDM  EKG:    I have reviewed the labs performed to date as well as medications administered while in observation.  Recent changes in the last 24 hours include initially was close to transfer today but then the facility called and indicated they do not have beds.  Labs reviewed over several days and her hypokalemia has improved.  On March 4 it was up to 3.0.  Patient is on potassium supplementation  Plan  Current plan is for inpatient treatment. Patient is under full IVC at this time.   Dorie Rank, MD 09/07/20 1014

## 2020-09-07 NOTE — ED Notes (Signed)
Pt updated on plan of care, pt states she was told she would be going home tomorrow.

## 2020-09-07 NOTE — Progress Notes (Signed)
Disposition CSW received call from [facility] representative who reports, due to staffing issues, they are not able to take patient on this day (3/7). Admissions has advised to call report tomorrow (3/8) to follow up. CSW will pursue alternative options at this time.   Signed:  Durenda Hurt, MSW, [place], LCASA 09/07/2020 7:52 AM

## 2020-09-07 NOTE — ED Notes (Signed)
Pt ambulatory to restroom using cane.  Pt making verbal threats to staff as she walks by, writer able to redirect to to use bathroom and return to room without further inappropriate language

## 2020-09-07 NOTE — ED Notes (Signed)
Sheriff Transport called(Transport to [employer]) @ [place], RN called by Modesty Rudy-arrival within 20 mins or more.

## 2020-09-07 NOTE — ED Notes (Signed)
Patient in restroom cleaning up independently. Provided with washcloths and clean scrubs. Sitter remains with patient.

## 2020-09-07 NOTE — ED Notes (Signed)
Lunch Tray Ordered @ 1023. 

## 2020-09-07 NOTE — ED Notes (Signed)
Pt on phone call with daughter

## 2020-09-07 NOTE — BH Assessment (Signed)
Disposition:  Upon chart review, patient's acceptance to [facility] was put on hold 09/07/2020 due to their staffing issues. Disposition Counselor/LCSW was asked to follow up tomorrow to see if their facility would be able to accept patient. Also, seek alternative placement options.  Disposition Counselor however received a call from Intake Staff (Cedrick) with [facility]. States that patient's bed is ready at this time and request that she is transported today.  Disposition Counselor explained that nursing would reach out to the sheriff department to request transport. However, transport could be delayed until tomorrow depending on the [facility] department volume of transports for today. Cedrick expressed his understanding.   Accepting provider @ [facility] is Dr. Camie Patience. Nurse report [phone]. Nursing to arrange sheriff transport. [facility] nursing also asked to check all IVC and insure that it is correct before transporting patient to [facility].   Disposition Counselor provided nursing (Malachi Bonds, RN and [place], Therapist, sports) all updates regarding patient's disposition and acceptance to [facility] as noted above.

## 2020-09-07 NOTE — ED Notes (Signed)
Patient was Given [name], [employer], and [employer]/ [employer].

## 2020-09-07 NOTE — ED Notes (Signed)
Daughter updated on plan of care, informed her mother will not be moves to [facility] today due to staff at that facility

## 2020-09-07 NOTE — ED Notes (Signed)
Dinner Tray Ordered @ 1700. 

## 2020-09-10 ENCOUNTER — Ambulatory Visit: Payer: Medicare Other | Admitting: Orthopedic Surgery

## 2020-09-15 ENCOUNTER — Telehealth: Payer: Self-pay | Admitting: Family

## 2020-09-15 ENCOUNTER — Telehealth: Payer: Self-pay

## 2020-09-15 NOTE — Telephone Encounter (Signed)
Called pt and made an appt for Thursday at 3pm

## 2020-09-15 NOTE — Telephone Encounter (Signed)
On chart,review I see patient was in ED 09/02/2020 was supposed to be placed in a Psychiatry hospital.will need to verify if still in Psychiatry unit or has been discharged home so that will can verify her medication.

## 2020-09-15 NOTE — Telephone Encounter (Signed)
They are playing voodoo games on her.  She expressed that she was released from [facility] where they took away her clothes and her phone ect.  But she is home now.

## 2020-09-15 NOTE — Telephone Encounter (Signed)
Pt called the office requesting an apt ASAP.  I offered her the morning apt Thursday she said it was too early. She said she needs an xray due to the fact that she fell in the kitchen in her house.  And she needs to see Dr. Sharol Given to write a letter says she obtained her injury's in the house so she can renew her lease ?   FYI pt was speaking in circles and was accusing her family of messing with medical equipment before she comes into the office.

## 2020-09-15 NOTE — Telephone Encounter (Signed)
Left message on voicemail for patient to return call when available    Reason for call: Clarify exactly what patient is requesting

## 2020-09-15 NOTE — Telephone Encounter (Signed)
Patient went to [facility] and they didn't give her the thyroid medicine and she said it is 0.25MG  and she said it is supposed to be 50.Marland Kitchen   Patient is asking for risperidone 0.2MG  people in [place] gave her that for her fall.. and she says she is having stomach and back spasms.   She said she fell in the kitchen and hurt herself.

## 2020-09-16 NOTE — Telephone Encounter (Signed)
This patient is not in a Psychiatry hospital

## 2020-09-17 ENCOUNTER — Other Ambulatory Visit: Payer: Self-pay

## 2020-09-17 ENCOUNTER — Ambulatory Visit (INDEPENDENT_AMBULATORY_CARE_PROVIDER_SITE_OTHER): Payer: Medicare Other | Admitting: Orthopedic Surgery

## 2020-09-17 ENCOUNTER — Encounter: Payer: Self-pay | Admitting: Orthopedic Surgery

## 2020-09-17 ENCOUNTER — Ambulatory Visit: Payer: Medicare Other | Admitting: Orthopedic Surgery

## 2020-09-17 DIAGNOSIS — G8929 Other chronic pain: Secondary | ICD-10-CM

## 2020-09-17 DIAGNOSIS — M7541 Impingement syndrome of right shoulder: Secondary | ICD-10-CM

## 2020-09-17 DIAGNOSIS — M170 Bilateral primary osteoarthritis of knee: Secondary | ICD-10-CM

## 2020-09-17 DIAGNOSIS — M25512 Pain in left shoulder: Secondary | ICD-10-CM | POA: Diagnosis not present

## 2020-09-17 DIAGNOSIS — M545 Low back pain, unspecified: Secondary | ICD-10-CM | POA: Diagnosis not present

## 2020-09-17 MED ORDER — IBUPROFEN 800 MG PO TABS: 800.0000 mg | ORAL_TABLET | Freq: Three times a day (TID) | ORAL | 0 refills | Status: DC | PRN

## 2020-09-17 MED ORDER — CYCLOBENZAPRINE HCL 10 MG PO TABS: 10.0000 mg | ORAL_TABLET | Freq: Three times a day (TID) | ORAL | 0 refills | Status: DC | PRN

## 2020-09-17 NOTE — Progress Notes (Signed)
Office Visit Note   Patient: [name] Long           Date of Birth: 06-Apr-1951           MRN: [id number] Visit Date: 09/17/2020              Requested by: Sandrea Hughs, NP [address] [place],  [place] [address] PCP: Sandrea Hughs, NP  Chief Complaint  Patient presents with  . Left Knee - Pain  . Right Shoulder - Pain  . Left Shoulder - Pain      HPI: Patient is a 70 year old woman who is seen in follow-up complaining about shoulder pain bilateral knee pain.  She also complains of back pain without radicular symptoms.  Patient states she would like a steroid injection in both knees she just had hyaluronic injections in both knees 2 months ago.  She has had a right shoulder injection 3 months ago.  Patient states that the [facility] Department took her to the hospital and then she was in [place].  She states this was for comfort care.  Assessment & Plan: Visit Diagnoses:  1. Bilateral primary osteoarthritis of knee   2. Impingement syndrome of right shoulder   3. Chronic bilateral low back pain without sciatica   4. Chronic left shoulder pain     Plan: A prescription is called in for ibuprofen for her generalized arthritic pain she states that this helps her and she does not have side effects with this medication.  A prescription was called in for Flexeril for her back symptoms she was also given a prescription to go to a medical supply store for an elastic back brace.  Follow-Up Instructions: Return if symptoms worsen or fail to improve.   Ortho Exam  Patient is alert, oriented, no adenopathy, well-dressed, normal affect, normal respiratory effort. Examination patient has a normal gait she has full active range of motion of both shoulders.  Both knees have full range of motion without symptoms.  She does have venous swelling in both legs she is not wearing compression stockings.  Patient has no radicular symptoms she has forward flexion to with her fingers  within 1 foot from the floor.  She has no pain in this range of motion no radicular symptoms.  Imaging: No results found. No images are attached to the encounter.  Labs: Lab Results  Component Value Date   HGBA1C 8.1 (H) 05/26/2020   HGBA1C 10.3 (A) 03/23/2020   HGBA1C 12.0 (A) 02/03/2020   LABORGA NO GROWTH 12/04/2014     Lab Results  Component Value Date   ALBUMIN 3.2 (L) 09/02/2020   ALBUMIN 3.3 (L) 04/03/2020   ALBUMIN 4.0 03/04/2020    Lab Results  Component Value Date   MG 1.7 09/04/2020   MG 1.7 12/25/2016   MG 1.8 06/10/2016   Lab Results  Component Value Date   VD25OH 43.0 02/23/2018   VD25OH 30 05/06/2015   VD25OH 12 (L) 12/18/2014    No results found for: PREALBUMIN CBC EXTENDED Latest Ref Rng & Units 09/03/2020 09/02/2020 05/26/2020  WBC 4.0 - 10.5 K/uL - 6.0 6.7  RBC 3.87 - 5.11 MIL/uL - 3.72(L) 3.49(L)  HGB 12.0 - 15.0 g/dL 11.9(L) 11.8(L) 11.4(L)  HCT 36.0 - 46.0 % 35.0(L) 35.0(L) 34.1(L)  PLT 150 - 400 K/uL - 197 228  NEUTROABS 1,500 - 7,800 cells/uL - - 2,566  LYMPHSABS 850 - 3,900 cells/uL - - 2,834     There is  no height or weight on file to calculate BMI.  Orders:  No orders of the defined types were placed in this encounter.  Meds ordered this encounter  Medications  . cyclobenzaprine (FLEXERIL) 10 MG tablet    Sig: Take 1 tablet (10 mg total) by mouth 3 (three) times daily as needed for muscle spasms.    Dispense:  30 tablet    Refill:  0  . ibuprofen (ADVIL) 800 MG tablet    Sig: Take 1 tablet (800 mg total) by mouth every 8 (eight) hours as needed.    Dispense:  30 tablet    Refill:  0     Procedures: No procedures performed  Clinical Data: No additional findings.  ROS:  All other systems negative, except as noted in the HPI. Review of Systems  Objective: Vital Signs: There were no vitals taken for this visit.  Specialty Comments:  No specialty comments available.  PMFS History: Patient Active Problem List    Diagnosis Date Noted  . Bipolar I disorder, most recent episode depressed, severe w psychosis ([place])   . Dementia associated with other underlying disease with behavioral disturbance ([place]) 03/27/2020  . GERD (gastroesophageal reflux disease) 12/27/2019  . Post-menopausal atrophic vaginitis 12/13/2019  . Vagina, candidiasis 12/10/2019  . Vaginal itching 11/10/2019  . Dysphagia 07/02/2019  . Screening examination for STD (sexually transmitted disease) 03/25/2019  . 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 Other chronic pain 12/03/2018  . Dizziness of unknown cause 08/04/2018  . Tremor 07/15/2018  . Atrial flutter with rapid ventricular response ([place]) 07/10/2017  . Atrial fibrillation ([place]) 07/05/2017  . COPD (chronic obstructive pulmonary disease) ([place]) 01/22/2017  . Pedal edema   . Chronic bilateral low back pain without sciatica 08/15/2016  . 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 Schizoaffective disorder ([place])   . Hypokalemia 01/22/2015  . Osteoarthritis, multiple sites 06/08/2011  . Dysuria 02/14/2011  . CHRONIC KIDNEY DISEASE STAGE II (MILD) 09/14/2009  . Hypothyroidism 08/31/2006  . Type 2 diabetes mellitus ([place]) 08/31/2006  . HYPERCHOLESTEROLEMIA 08/31/2006  . Tobacco abuse 08/31/2006  . HYPERTENSION, BENIGN SYSTEMIC 08/31/2006   Past Medical History:  Diagnosis Date  . Abscessed tooth 02/09/2020  . Acute on chronic congestive heart failure ([place])   . Adenomatous colon polyp   . Arthritis    "real bad; all over" (07/12/2017)  . Asthma   . Atrial fibrillation ([place])   . BIPOLAR DISORDER 08/31/2006   Qualifier: Diagnosis of  By: Dorathy Daft MD, Marjory Lies    . CHRONIC KIDNEY DISEASE STAGE II (MILD) 09/14/2009   Annotation: eGFR 64 Qualifier: Diagnosis of  By: Jess Barters MD, [name]    . Chronic lower back pain   . Congestive heart failure (CHF) ([place])   . COPD (chronic obstructive pulmonary disease) ([place]) 09/23/2010   Diagnosed at [facility] in  2008 (Dr. Annamaria Boots)   . DM (diabetes mellitus) type II controlled with renal manifestation ([place]) 08/31/2006   Qualifier: Diagnosis of  By: Dorathy Daft MD, Marjory Lies    . Dyspnea    "all my life; since 6th grade" (07/12/2017)  . Fatty liver   . HYPERCHOLESTEROLEMIA 08/31/2006   Intolerance to Lipitor OK on Crestor but medicaid no longer covering    . HYPERTENSION, BENIGN SYSTEMIC 08/31/2006   Qualifier: Diagnosis of  By: Dorathy Daft MD, Marjory Lies    . Hypokalemia   . HYPOTHYROIDISM, UNSPECIFIED 08/31/2006   Qualifier: Diagnosis of  By: Dorathy Daft MD, Marjory Lies    . Leg swelling 03/14/2018  .  Pneumonia    "3 times" (07/12/2017)  . Pulmonary nodule   . Renal cyst   . Schizophrenia ([place])   . Scoliosis   . Stomach problems   . Thyroid disorder     Family History  Problem Relation Age of Onset  . Breast cancer Sister   . Heart disease Mother 16  . Rectal cancer Mother   . Diabetes Father 30  . Hypertension Father   . Heart disease Brother   . Asthma Daughter   . Asthma Son   . Breast cancer Sister   . Asthma Daughter   . Colon cancer Maternal Grandmother   . Stomach cancer Neg Hx   . Esophageal cancer Neg Hx   . Pancreatic cancer Neg Hx     Past Surgical History:  Procedure Laterality Date  . CESAREAN SECTION    . FOOT FRACTURE SURGERY Right    "steel plate in it"  . FOOT SURGERY     " born w/dislocated foot"  . FRACTURE SURGERY    . KNEE ARTHROSCOPY Right   . TOE SURGERY Bilateral    "both pinky toes"  . TONSILLECTOMY AND ADENOIDECTOMY     Social History   Occupational History  . Not on file  Tobacco Use  . Smoking status: Current Some Day Smoker    Packs/day: 0.25    Years: 45.00    Pack years: 11.25    Types: Cigarettes  . Smokeless tobacco: Never Used  . Tobacco comment: patient states she smokes 1 cigarette per month  Vaping Use  . Vaping Use: Never used  Substance and Sexual Activity  . Alcohol use: Not Currently    Comment: 07/12/2017 "stopped 2 yr ago; did have a drink over  the holidays recently"  . Drug use: No  . Sexual activity: Not Currently

## 2020-09-18 ENCOUNTER — Telehealth: Payer: Self-pay | Admitting: Orthopedic Surgery

## 2020-09-18 ENCOUNTER — Telehealth: Payer: Self-pay | Admitting: Adult Health

## 2020-09-18 DIAGNOSIS — H0100A Unspecified blepharitis right eye, upper and lower eyelids: Secondary | ICD-10-CM | POA: Diagnosis not present

## 2020-09-18 NOTE — Telephone Encounter (Signed)
Pt called to report that she is upset that the practice is "messing with her medications".  I let her know I am here as the oncall provider to answer acute concerns  but that she would need to call and make an apt at the office on Monday to address any med changes. She stated that I needed to lose my job and should be unemployed and hung up the phone.

## 2020-09-18 NOTE — Telephone Encounter (Signed)
Patient called asked if a Rx for the elastic back brace can be faxed to [facility]. The number to contact patient if needed is [phone]

## 2020-09-21 ENCOUNTER — Emergency Department (HOSPITAL_COMMUNITY)
Admission: EM | Admit: 2020-09-21 | Discharge: 2020-09-22 | Disposition: A | Discharge status: Home / Self Care | Payer: Medicare Other | Attending: Emergency Medicine | Admitting: Emergency Medicine

## 2020-09-21 ENCOUNTER — Encounter (HOSPITAL_COMMUNITY): Payer: Self-pay | Admitting: Emergency Medicine

## 2020-09-21 DIAGNOSIS — I130 Hypertensive heart and chronic kidney disease with heart failure and stage 1 through stage 4 chronic kidney disease, or unspecified chronic kidney disease: Secondary | ICD-10-CM | POA: Diagnosis not present

## 2020-09-21 DIAGNOSIS — E039 Hypothyroidism, unspecified: Secondary | ICD-10-CM | POA: Insufficient documentation

## 2020-09-21 DIAGNOSIS — F17210 Nicotine dependence, cigarettes, uncomplicated: Secondary | ICD-10-CM | POA: Diagnosis not present

## 2020-09-21 DIAGNOSIS — Z7984 Long term (current) use of oral hypoglycemic drugs: Secondary | ICD-10-CM | POA: Insufficient documentation

## 2020-09-21 DIAGNOSIS — Z91040 Latex allergy status: Secondary | ICD-10-CM | POA: Diagnosis not present

## 2020-09-21 DIAGNOSIS — N182 Chronic kidney disease, stage 2 (mild): Secondary | ICD-10-CM | POA: Insufficient documentation

## 2020-09-21 DIAGNOSIS — J449 Chronic obstructive pulmonary disease, unspecified: Secondary | ICD-10-CM | POA: Diagnosis not present

## 2020-09-21 DIAGNOSIS — F0281 Dementia in other diseases classified elsewhere with behavioral disturbance: Secondary | ICD-10-CM | POA: Diagnosis not present

## 2020-09-21 DIAGNOSIS — J45909 Unspecified asthma, uncomplicated: Secondary | ICD-10-CM | POA: Diagnosis not present

## 2020-09-21 DIAGNOSIS — R259 Unspecified abnormal involuntary movements: Secondary | ICD-10-CM | POA: Diagnosis not present

## 2020-09-21 DIAGNOSIS — I509 Heart failure, unspecified: Secondary | ICD-10-CM | POA: Diagnosis not present

## 2020-09-21 DIAGNOSIS — Z046 Encounter for general psychiatric examination, requested by authority: Secondary | ICD-10-CM | POA: Diagnosis not present

## 2020-09-21 DIAGNOSIS — Z79899 Other long term (current) drug therapy: Secondary | ICD-10-CM | POA: Diagnosis not present

## 2020-09-21 LAB — RAPID URINE DRUG SCREEN, HOSP PERFORMED
Amphetamines: NOT DETECTED
Barbiturates: NOT DETECTED
Benzodiazepines: NOT DETECTED
Cocaine: NOT DETECTED
Opiates: NOT DETECTED
Tetrahydrocannabinol: NOT DETECTED

## 2020-09-21 LAB — CBC
HCT: 31 % — ABNORMAL LOW (ref 36.0–46.0)
Hemoglobin: 10.5 g/dL — ABNORMAL LOW (ref 12.0–15.0)
MCH: 31.6 pg (ref 26.0–34.0)
MCHC: 33.9 g/dL (ref 30.0–36.0)
MCV: 93.4 fL (ref 80.0–100.0)
Platelets: 229 10*3/uL (ref 150–400)
RBC: 3.32 MIL/uL — ABNORMAL LOW (ref 3.87–5.11)
RDW: 14.6 % (ref 11.5–15.5)
WBC: 5 10*3/uL (ref 4.0–10.5)
nRBC: 0 % (ref 0.0–0.2)

## 2020-09-21 LAB — COMPREHENSIVE METABOLIC PANEL
ALT: 16 U/L (ref 0–44)
AST: 15 U/L (ref 15–41)
Albumin: 3.1 g/dL — ABNORMAL LOW (ref 3.5–5.0)
Alkaline Phosphatase: 82 U/L (ref 38–126)
Anion gap: 9 (ref 5–15)
BUN: 20 mg/dL (ref 8–23)
CO2: 25 mmol/L (ref 22–32)
Calcium: 9.1 mg/dL (ref 8.9–10.3)
Chloride: 101 mmol/L (ref 98–111)
Creatinine, Ser: 1.58 mg/dL — ABNORMAL HIGH (ref 0.44–1.00)
GFR, Estimated: 35 mL/min — ABNORMAL LOW (ref >60)
Glucose, Bld: 144 mg/dL — ABNORMAL HIGH (ref 70–99)
Potassium: 2.8 mmol/L — ABNORMAL LOW (ref 3.5–5.1)
Sodium: 135 mmol/L (ref 135–145)
Total Bilirubin: 0.4 mg/dL (ref 0.3–1.2)
Total Protein: 7.1 g/dL (ref 6.5–8.1)

## 2020-09-21 LAB — ETHANOL: Alcohol, Ethyl (B): <10 mg/dL (ref <10)

## 2020-09-21 LAB — SALICYLATE LEVEL: Salicylate Lvl: <7 mg/dL — ABNORMAL LOW (ref 7.0–30.0)

## 2020-09-21 LAB — ACETAMINOPHEN LEVEL: Acetaminophen (Tylenol), Serum: <10 ug/mL — ABNORMAL LOW (ref 10–30)

## 2020-09-21 MED ORDER — POTASSIUM CHLORIDE CRYS ER 20 MEQ PO TBCR
40.0000 meq | EXTENDED_RELEASE_TABLET | Freq: Once | ORAL | Status: DC
  Filled 2020-09-21: qty 2
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
## 2020-09-21 NOTE — Telephone Encounter (Signed)
Will call patient to schedule for follow up appointment to discuss  medication concerns

## 2020-09-21 NOTE — Telephone Encounter (Signed)
Patient is already scheduled for appointment 09/22/2020 at 2:45pm.

## 2020-09-21 NOTE — Telephone Encounter (Signed)
Noted  

## 2020-09-21 NOTE — Discharge Instructions (Addendum)
Please follow-up with your psychiatrist to discuss your ongoing psychiatric care.  Follow-up with your primary doctor to discuss your low potassium levels and ongoing medication management.  Continue your potassium supplements as previously prescribed.
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
## 2020-09-22 ENCOUNTER — Ambulatory Visit: Payer: Medicare Other | Admitting: Family

## 2020-09-22 NOTE — ED Notes (Signed)
Patient left before Potassium given and vitals.

## 2020-09-22 NOTE — ED Notes (Signed)
Resention of IVC sent to magistrate.

## 2020-09-24 ENCOUNTER — Encounter: Payer: Self-pay | Admitting: Family

## 2020-09-24 ENCOUNTER — Telehealth: Payer: Self-pay

## 2020-09-24 ENCOUNTER — Telehealth: Payer: Self-pay | Admitting: Orthopedic Surgery

## 2020-09-24 ENCOUNTER — Ambulatory Visit (INDEPENDENT_AMBULATORY_CARE_PROVIDER_SITE_OTHER): Payer: Medicare Other | Admitting: Family

## 2020-09-24 ENCOUNTER — Other Ambulatory Visit: Payer: Self-pay

## 2020-09-24 DIAGNOSIS — F250 Schizoaffective disorder, bipolar type: Secondary | ICD-10-CM

## 2020-09-24 NOTE — Telephone Encounter (Signed)
[name], [name] Long are scheduled for a virtual visit with your provider today.    Just as we do with appointments in the office, we must obtain your consent to participate.  Your consent will be active for this visit and any virtual visit you may have with one of our providers in the next 365 days.    If you have a MyChart account, I can also send a copy of this consent to you electronically.  All virtual visits are billed to your insurance company just like a traditional visit in the office.  As this is a virtual visit, video technology does not allow for your provider to perform a traditional examination.  This may limit your provider's ability to fully assess your condition.  If your provider identifies any concerns that need to be evaluated in person or the need to arrange testing such as labs, EKG, etc, we will make arrangements to do so.    Although advances in technology are sophisticated, we cannot ensure that it will always work on either your end or our end.  If the connection with a video visit is poor, we may have to switch to a telephone visit.  With either a video or telephone visit, we are not always able to ensure that we have a secure connection.   I need to obtain your verbal consent now.   Are you willing to proceed with your visit today?   [name] Long has provided verbal consent on 09/24/2020 for a virtual visit (video or telephone).   [place], [place] 09/24/2020  1:51 PM

## 2020-09-24 NOTE — Telephone Encounter (Signed)
error 
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
## 2020-09-29 ENCOUNTER — Other Ambulatory Visit: Payer: Self-pay | Admitting: Orthopedic Surgery

## 2020-09-29 ENCOUNTER — Other Ambulatory Visit: Payer: Self-pay | Admitting: Internal Medicine

## 2020-09-29 DIAGNOSIS — K219 Gastro-esophageal reflux disease without esophagitis: Secondary | ICD-10-CM

## 2020-09-30 NOTE — Telephone Encounter (Signed)
Dr Hilarie Fredrickson, do you want me to refill?

## 2020-10-01 ENCOUNTER — Ambulatory Visit (INDEPENDENT_AMBULATORY_CARE_PROVIDER_SITE_OTHER): Payer: Medicare Other | Admitting: Family

## 2020-10-01 ENCOUNTER — Other Ambulatory Visit: Payer: Self-pay

## 2020-10-01 ENCOUNTER — Other Ambulatory Visit: Payer: Medicare Other

## 2020-10-01 ENCOUNTER — Encounter: Payer: Self-pay | Admitting: Family

## 2020-10-01 VITALS — BP 110/70 | HR 71 | Temp 97.5°F | Resp 20 | Ht 65.0 in | Wt 152.4 lb

## 2020-10-01 DIAGNOSIS — N182 Chronic kidney disease, stage 2 (mild): Secondary | ICD-10-CM | POA: Diagnosis not present

## 2020-10-01 DIAGNOSIS — N952 Postmenopausal atrophic vaginitis: Secondary | ICD-10-CM | POA: Diagnosis not present

## 2020-10-01 DIAGNOSIS — E1142 Type 2 diabetes mellitus with diabetic polyneuropathy: Secondary | ICD-10-CM | POA: Diagnosis not present

## 2020-10-01 DIAGNOSIS — E1122 Type 2 diabetes mellitus with diabetic chronic kidney disease: Secondary | ICD-10-CM | POA: Diagnosis not present

## 2020-10-01 DIAGNOSIS — I480 Paroxysmal atrial fibrillation: Secondary | ICD-10-CM | POA: Diagnosis not present

## 2020-10-01 DIAGNOSIS — N183 Chronic kidney disease, stage 3 unspecified: Secondary | ICD-10-CM | POA: Diagnosis not present

## 2020-10-01 DIAGNOSIS — E538 Deficiency of other specified B group vitamins: Secondary | ICD-10-CM | POA: Diagnosis not present

## 2020-10-01 DIAGNOSIS — Z124 Encounter for screening for malignant neoplasm of cervix: Secondary | ICD-10-CM

## 2020-10-01 DIAGNOSIS — I129 Hypertensive chronic kidney disease with stage 1 through stage 4 chronic kidney disease, or unspecified chronic kidney disease: Secondary | ICD-10-CM | POA: Diagnosis not present

## 2020-10-01 DIAGNOSIS — E039 Hypothyroidism, unspecified: Secondary | ICD-10-CM | POA: Diagnosis not present

## 2020-10-01 DIAGNOSIS — R399 Unspecified symptoms and signs involving the genitourinary system: Secondary | ICD-10-CM

## 2020-10-01 MED ORDER — METFORMIN HCL 1000 MG PO TABS: 1000.0000 mg | ORAL_TABLET | Freq: Two times a day (BID) | ORAL | 1 refills | Status: DC

## 2020-10-01 NOTE — Progress Notes (Signed)
Provider: Marlowe Sax FNP-C  Kyiah Canepa, Nelda Bucks, NP  Patient Care Team: Yonael Tulloch, Nelda Bucks, NP as PCP - General (Family Medicine) Newt Minion, MD as Consulting Physician (Orthopedic Surgery) Dixie Dials, MD as Consulting Physician (Cardiology)  Extended Emergency Contact Information Primary Emergency Contact: Shelda Pal Address: [address]          [place], [place] [address] [name] of [place] Phone: [phone] Mobile Phone: [phone] Relation: Daughter Secondary Emergency Contact: [place] of [place] Phone: [phone] Relation: Daughter  Code Status: Full Code  Goals of care: Advanced Directive information Advanced Directives 10/01/2020  Does Patient Have a Medical Advance Directive? No  Type of Advance Directive -  Does patient want to make changes to medical advance directive? No - Patient declined  Copy of [place] in Chart? -  Would patient like information on creating a medical advance directive? -  Some encounter information is confidential and restricted. Go to Review Flowsheets activity to see all data.     Chief Complaint  Patient presents with  . Acute Visit    Pap Smear & Fasting Labs Patient wants to know if you can change her Metformin to glucophage    HPI:  Pt is a 70 y.o. female seen today for an acute visit for pap smear and fasting lab work today.she complains of vaginal itching.had some vaginal cream prescribed in the past by another provider so had applied last night with relief. She denies any vaginal discharge or bleeding but states has had some itching and burning sensation with urination.also has slight right lower quadrant tenderness.    Past Medical History:  Diagnosis Date  . Abscessed tooth 02/09/2020  . Acute on chronic congestive heart failure ([place])   . Adenomatous colon polyp   . Arthritis    "real bad; all over" (07/12/2017)  . Asthma   . Atrial fibrillation  ([place])   . BIPOLAR DISORDER 08/31/2006   Qualifier: Diagnosis of  By: Dorathy Daft MD, Marjory Lies    . CHRONIC KIDNEY DISEASE STAGE II (MILD) 09/14/2009   Annotation: eGFR 64 Qualifier: Diagnosis of  By: Jess Barters MD, [name]    . Chronic lower back pain   . Congestive heart failure (CHF) ([place])   . COPD (chronic obstructive pulmonary disease) ([place]) 09/23/2010   Diagnosed at [facility] in 2008 (Dr. Annamaria Boots)   . DM (diabetes mellitus) type II controlled with renal manifestation ([place]) 08/31/2006   Qualifier: Diagnosis of  By: Dorathy Daft MD, Marjory Lies    . Dyspnea    "all my life; since 6th grade" (07/12/2017)  . Fatty liver   . HYPERCHOLESTEROLEMIA 08/31/2006   Intolerance to Lipitor OK on Crestor but medicaid no longer covering    . HYPERTENSION, BENIGN SYSTEMIC 08/31/2006   Qualifier: Diagnosis of  By: Dorathy Daft MD, Marjory Lies    . Hypokalemia   . HYPOTHYROIDISM, UNSPECIFIED 08/31/2006   Qualifier: Diagnosis of  By: Dorathy Daft MD, Marjory Lies    . Leg swelling 03/14/2018  . Pneumonia    "3 times" (07/12/2017)  . Pulmonary nodule   . Renal cyst   . Schizophrenia ([place])   . Scoliosis   . Stomach problems   . Thyroid disorder    Past Surgical History:  Procedure Laterality Date  . CESAREAN SECTION    . FOOT FRACTURE SURGERY Right    "steel plate in it"  . FOOT SURGERY     " born w/dislocated foot"  . FRACTURE SURGERY    . KNEE  ARTHROSCOPY Right   . TOE SURGERY Bilateral    "both pinky toes"  . TONSILLECTOMY AND ADENOIDECTOMY      Allergies  Allergen Reactions  . Citrus Anaphylaxis and Itching  . Fish Allergy Anaphylaxis    Cod  . Shellfish Allergy Shortness Of Breath and Other (See Comments)    "Affects thyroid" also  . Adhesive [Tape] Other (See Comments)    Must have paper tape only  . Ibuprofen Swelling    Face swells  . Latex Dermatitis  . Lipitor [Atorvastatin Calcium] Other (See Comments)    Body aches  . Lisinopril Other (See Comments)    PER DR. Melvyn Novas (not recalled by patient)  . Other Nausea Only  and Other (See Comments)    Collards (gas, too)  . Codeine Rash  . Tramadol Palpitations    Outpatient Encounter Medications as of 10/01/2020  Medication Sig  . acetaminophen (TYLENOL) 500 MG tablet Take 2 tablets (1,000 mg total) by mouth in the morning and at bedtime. And 500 mg tablet daily at 2 pm  . albuterol (VENTOLIN HFA) 108 (90 Base) MCG/ACT inhaler Inhale 2 puffs into the lungs 4 (four) times daily as needed for wheezing or shortness of breath.  Marland Kitchen amLODipine (NORVASC) 5 MG tablet Take 1 tablet (5 mg total) by mouth daily.  . Blood Glucose Monitoring Suppl (ONETOUCH VERIO) w/Device KIT Use to test blood sugar once daily. Dx: E11.9  . Blood Pressure Monitoring (BLOOD PRESSURE MONITOR/WRIST) KIT 1 Units by Does not apply route daily at 6 (six) AM.  . glucose blood (ONETOUCH VERIO) test strip Use to test blood sugar once daily. Dx: E11.9  . glucose blood test strip One Touch Ultra 2 Test Strips Use to test blood sugar once daily. Dx: E11.9  . hydrochlorothiazide (HYDRODIURIL) 25 MG tablet Take one tablet by mouth once daily.  Marland Kitchen HYDROcodone-acetaminophen (NORCO/VICODIN) 5-325 MG tablet Take 1 tablet by mouth every 4 (four) hours as needed for moderate pain.  Marland Kitchen ibuprofen (ADVIL) 800 MG tablet Take 1 tablet (800 mg total) by mouth every 8 (eight) hours as needed.  Marland Kitchen JANUVIA 25 MG tablet Take 1 tablet (25 mg total) by mouth daily.  . Lancets (ONETOUCH ULTRASOFT) lancets Use to test blood sugar once daily. Dx: E11.9  . levothyroxine (SYNTHROID) 25 MCG tablet TAKE 1 TABLET(25 MCG) BY MOUTH DAILY BEFORE BREAKFAST  . metFORMIN (GLUCOPHAGE) 1000 MG tablet Take one tablet by mouth twice daily with a meal.  . metoprolol succinate (TOPROL-XL) 50 MG 24 hr tablet Take 1 tablet (50 mg total) by mouth daily. Take with or immediately following a meal.  . risperiDONE (RISPERDAL) 2 MG tablet Take 2 mg by mouth at bedtime.  . rosuvastatin (CRESTOR) 20 MG tablet Take one tablet by mouth once daily.  .  sucralfate (CARAFATE) 1 GM/10ML suspension SHAKE LIQUID AND TAKE 10 ML(1 GRAM) BY MOUTH FOUR TIMES DAILY  . [DISCONTINUED] cyclobenzaprine (FLEXERIL) 10 MG tablet Take 1 tablet (10 mg total) by mouth 3 (three) times daily as needed for muscle spasms.  . [DISCONTINUED] estradiol (ESTRACE) 0.1 MG/GM vaginal cream Place 1 Applicatorful vaginally at bedtime.  . [DISCONTINUED] ibuprofen (ADVIL) 800 MG tablet TAKE 1 TABLET(800 MG) BY MOUTH EVERY 8 HOURS AS NEEDED  . [DISCONTINUED] LORazepam (ATIVAN) 0.5 MG tablet Take 0.5 mg by mouth daily as needed for anxiety.  . [DISCONTINUED] omeprazole (PRILOSEC) 40 MG capsule Take one capsule by mouth once daily.  . [DISCONTINUED] potassium chloride (KLOR-CON) 10 MEQ tablet Take one  tablet by mouth once daily. (Patient taking differently: Take one tablet by mouth once daily.  Patient says she is taking it 2 time DAILY NOW)   Facility-Administered Encounter Medications as of 10/01/2020  Medication  . 0.9 %  sodium chloride infusion    Review of Systems  Constitutional: Negative for appetite change, chills, fatigue and fever.  Eyes: Positive for visual disturbance. Negative for pain, discharge, redness and itching.       Has eye drop states prescribed by eye doctor to remove some vodoo in the eye   Respiratory: Negative for cough, chest tightness, shortness of breath and wheezing.   Cardiovascular: Negative for chest pain, palpitations and leg swelling.  Gastrointestinal: Negative for abdominal distention, abdominal pain, constipation, diarrhea, nausea and vomiting.  Genitourinary: Positive for dysuria and frequency. Negative for decreased urine volume, difficulty urinating, pelvic pain, urgency, vaginal bleeding and vaginal discharge.       Vaginal itching   Musculoskeletal: Negative for arthralgias, back pain, gait problem and joint swelling.  Skin: Negative for color change, pallor and rash.  Psychiatric/Behavioral: Negative for sleep disturbance. The  patient is not nervous/anxious.        Continue to taking about vodoo in her eyes and some people messing up with her vagina with vodoo.    Immunization History  Administered Date(s) Administered  . Influenza Split 05/17/2011, 04/27/2012  . Influenza Whole 07/11/2007, 06/30/2008, 06/02/2009, 04/22/2010  . Influenza,inj,Quad PF,6+ Mos 04/16/2013, 05/02/2014, 04/26/2017, 02/23/2018, 04/11/2019  . PFIZER(Purple Top)SARS-COV-2 Vaccination 10/25/2019, 11/19/2019  . Pneumococcal Conjugate-13 04/26/2017  . Pneumococcal Polysaccharide-23 07/11/2007, 06/08/2011, 03/12/2020  . Td 08/04/1998  . Tdap 06/08/2011   Pertinent  Health Maintenance Due  Topic Date Due  . FOOT EXAM  04/26/2018  . OPHTHALMOLOGY EXAM  07/18/2020  . INFLUENZA VACCINE  10/01/2020 (Originally 02/02/2020)  . LIPID PANEL  11/23/2020  . HEMOGLOBIN A1C  11/23/2020  . URINE MICROALBUMIN  03/23/2021  . MAMMOGRAM  05/27/2022  . COLONOSCOPY (Pts 45-34yr Insurance coverage will need to be confirmed)  11/05/2022  . DEXA SCAN  Completed  . PNA vac Low Risk Adult  Completed   Fall Risk  09/24/2020 08/21/2020 05/26/2020 04/28/2020 03/23/2020  Falls in the past year? 0 0 0 1 0  Number falls in past yr: 0 0 0 0 0  Injury with Fall? 0 0 0 1 0  Follow up - - - - -   Functional Status Survey:    Vitals:   10/01/20 1046  BP: 110/70  Pulse: 71  Resp: 20  Temp: (!) 97.5 F (36.4 C)  TempSrc: Temporal  SpO2: 98%  Weight: 152 lb 6.4 oz (69.1 kg)  Height: '5\' 5"'  (1.651 m)   Body mass index is 25.36 kg/m. Physical Exam Vitals reviewed. Exam conducted with a chaperone present (Marisa Cyphers).  Constitutional:      General: She is not in acute distress.    Appearance: She is overweight. She is not ill-appearing.  Eyes:     General: No scleral icterus.       Right eye: No discharge.        Left eye: No discharge.     Conjunctiva/sclera: Conjunctivae normal.     Pupils: Pupils are equal, round, and reactive to light.   Cardiovascular:     Rate and Rhythm: Normal rate and regular rhythm.     Pulses: Normal pulses.     Heart sounds: Normal heart sounds. No murmur heard. No friction rub. No gallop.   Pulmonary:  Effort: Pulmonary effort is normal. No respiratory distress.     Breath sounds: Normal breath sounds. No wheezing, rhonchi or rales.  Chest:     Chest wall: No tenderness.  Abdominal:     General: Bowel sounds are normal. There is no distension.     Palpations: Abdomen is soft. There is no mass.     Tenderness: There is no right CVA tenderness, left CVA tenderness, guarding or rebound.     Hernia: There is no hernia in the left inguinal area or right inguinal area.     Comments: Right lower quadrant slight tenderness to palpation   Genitourinary:    Exam position: Lithotomy position.     Labia:        Right: No rash, tenderness, lesion or injury.        Left: No rash, tenderness, lesion or injury.      Urethra: No prolapse, urethral pain, urethral swelling or urethral lesion.     Vagina: Normal.     Cervix: Normal.     Adnexa: Right adnexa normal and left adnexa normal.     Rectum: Normal.     Comments: Cervix exam limited due to vaginal cream in place cleansed with cotton swab as much as possible.No bleeding.  Musculoskeletal:        General: No swelling or tenderness. Normal range of motion.     Right lower leg: No edema.     Left lower leg: No edema.  Lymphadenopathy:     Lower Body: No right inguinal adenopathy. No left inguinal adenopathy.  Skin:    General: Skin is warm and dry.     Coloration: Skin is not pale.     Findings: No bruising, erythema or rash.  Neurological:     Mental Status: She is alert. Mental status is at baseline.     Cranial Nerves: No cranial nerve deficit.     Motor: No weakness.     Gait: Gait normal.  Psychiatric:        Mood and Affect: Mood normal.        Speech: Speech normal.        Behavior: Behavior normal.        Thought Content: Thought  content is paranoid.     Labs reviewed: Recent Labs    09/02/20 1257 09/03/20 0833 09/04/20 1245 09/21/20 1917  NA 134* 140 132* 135  K 2.5* 2.7* 3.0* 2.8*  CL 99 101 97* 101  CO2 24  --  26 25  GLUCOSE 180* 137* 118* 144*  BUN 38* 29* 18 20  CREATININE 1.99* 1.40* 1.27* 1.58*  CALCIUM 9.4  --  9.5 9.1  MG  --   --  1.7  --    Recent Labs    04/03/20 1821 04/28/20 1509 05/26/20 1553 09/02/20 1257 09/21/20 1917  AST 21   < > '11 19 15  ' ALT 16   < > '12 18 16  ' ALKPHOS 76  --   --  67 82  BILITOT 0.9   < > 0.3 0.6 0.4  PROT 7.1   < > 7.0 7.2 7.1  ALBUMIN 3.3*  --   --  3.2* 3.1*   < > = values in this interval not displayed.   Recent Labs    03/04/20 0956 03/09/20 0244 04/28/20 1509 05/26/20 1553 09/02/20 1257 09/03/20 0833 09/21/20 1917  WBC 8.9   < > 6.7 6.7 6.0  --  5.0  NEUTROABS 4.2  --  3,176 2,566  --   --   --   HGB 14.2   < > 10.7* 11.4* 11.8* 11.9* 10.5*  HCT 43.0   < > 30.7* 34.1* 35.0* 35.0* 31.0*  MCV 98.4   < > 95.3 97.7 94.1  --  93.4  PLT 213.0   < > 227 228 197  --  229   < > = values in this interval not displayed.   Lab Results  Component Value Date   TSH 1.51 05/26/2020   Lab Results  Component Value Date   HGBA1C 8.1 (H) 05/26/2020   Lab Results  Component Value Date   CHOL 178 05/26/2020   HDL 60 05/26/2020   LDLCALC 98 05/26/2020   LDLDIRECT 105 (H) 08/14/2012   TRIG 107 05/26/2020   CHOLHDL 3.0 05/26/2020    Significant Diagnostic Results in last 30 days:  No results found.  Assessment/Plan 1. Encounter for Papanicolaou smear for cervical cancer screening Vagina exam normal though cervix limited due to presence of cream.area cleaned as much as possible no bleeding or discharge noted.   - PAP, Image Guided [[facility]]  2. Symptoms of urinary tract infection Afebrile Reports itching and burning with urination.urine send for culture aware results will be back in 3 days.  - Urine Culture   3. Type 2 diabetes  mellitus with stage 3 chronic kidney disease, without long-term current use of insulin, unspecified whether stage 3a or 3b CKD ([place]) Lab Results  Component Value Date   HGBA1C 8.1 (H) 05/26/2020  Request metformin to be switched to Glucophage.No home CBG for review.will check blood work today. Dietary modification advised and exercise.  - metFORMIN (GLUCOPHAGE) 1000 MG tablet; Take 1 tablet (1,000 mg total) by mouth 2 (two) times daily with a meal.  Dispense: 90 tablet; Refill: 1  4. Post-menopausal atrophic vaginitis Had vaginal itching and dryness.has used old vaginal cream that she had with much improvement.  - continue vaginal cream will notify provider name of the cream    Family/ staff Communication: Reviewed plan of care with patient verbalized understanding.  Labs/tests ordered:  - Urine Culture  Has fasting lab work in place   Next Appointment: As needed if symptoms worsen or fail to improve    Sandrea Hughs, NP

## 2020-10-01 NOTE — Patient Instructions (Signed)
Urine send for culture then will call you with results

## 2020-10-02 ENCOUNTER — Telehealth: Payer: Self-pay | Admitting: Family

## 2020-10-02 LAB — PAP IG (IMAGE GUIDED)

## 2020-10-02 NOTE — Telephone Encounter (Signed)
Ok to refill until the 30 day period expires resulting in dismissal JMP

## 2020-10-02 NOTE — Telephone Encounter (Signed)
Patient called and said dr.Pertle did a endoscope /colonoscopy he took 3 things out of stomach and rectum (Tags) using equipment. Saying he was using stuff after hours to use to mess with her sexual organs. Dr. Betsy Coder office knows as well, she cancelled appointment on Monday. 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 She also had the same reaction from a voo doo doll. She said that [facility] opthalmology did something to her eyedrops.  Tracy Gerken wants [name] to call her back.  She said that she is staying at [facility] in [place] [address] [address] [address] She said she already called the police and told them everything.

## 2020-10-02 NOTE — Telephone Encounter (Signed)
I recommend scheduling a visit with your Ophthalmologist to evaluation eyes.

## 2020-10-03 LAB — COMPLETE METABOLIC PANEL WITH GFR
AG Ratio: 1.2 (calc) (ref 1.0–2.5)
ALT: 12 U/L (ref 6–29)
AST: 15 U/L (ref 10–35)
Albumin: 3.9 g/dL (ref 3.6–5.1)
Alkaline phosphatase (APISO): 81 U/L (ref 37–153)
BUN/Creatinine Ratio: 17 (calc) (ref 6–22)
BUN: 20 mg/dL (ref 7–25)
CO2: 28 mmol/L (ref 20–32)
Calcium: 9.5 mg/dL (ref 8.6–10.4)
Chloride: 99 mmol/L (ref 98–110)
Creat: 1.18 mg/dL — ABNORMAL HIGH (ref 0.50–0.99)
GFR, Est African American: 54 mL/min/{1.73_m2} — ABNORMAL LOW (ref >=60)
GFR, Est Non African American: 47 mL/min/{1.73_m2} — ABNORMAL LOW (ref >=60)
Globulin: 3.3 g/dL (calc) (ref 1.9–3.7)
Glucose, Bld: 120 mg/dL — ABNORMAL HIGH (ref 65–99)
Potassium: 3.7 mmol/L (ref 3.5–5.3)
Sodium: 138 mmol/L (ref 135–146)
Total Bilirubin: 0.3 mg/dL (ref 0.2–1.2)
Total Protein: 7.2 g/dL (ref 6.1–8.1)

## 2020-10-03 LAB — URINE CULTURE
MICRO NUMBER:: 11720743
SPECIMEN QUALITY:: ADEQUATE

## 2020-10-03 LAB — CBC WITH DIFFERENTIAL/PLATELET
Absolute Monocytes: 825 cells/uL (ref 200–950)
Basophils Absolute: 19 cells/uL (ref 0–200)
Basophils Relative: 0.3 %
Eosinophils Absolute: 217 cells/uL (ref 15–500)
Eosinophils Relative: 3.5 %
HCT: 34.7 % — ABNORMAL LOW (ref 35.0–45.0)
Hemoglobin: 11.4 g/dL — ABNORMAL LOW (ref 11.7–15.5)
Lymphs Abs: 2108 cells/uL (ref 850–3900)
MCH: 31.5 pg (ref 27.0–33.0)
MCHC: 32.9 g/dL (ref 32.0–36.0)
MCV: 95.9 fL (ref 80.0–100.0)
MPV: 10.4 fL (ref 7.5–12.5)
Monocytes Relative: 13.3 %
Neutro Abs: 3032 cells/uL (ref 1500–7800)
Neutrophils Relative %: 48.9 %
Platelets: 242 10*3/uL (ref 140–400)
RBC: 3.62 10*6/uL — ABNORMAL LOW (ref 3.80–5.10)
RDW: 14.7 % (ref 11.0–15.0)
Total Lymphocyte: 34 %
WBC: 6.2 10*3/uL (ref 3.8–10.8)

## 2020-10-03 LAB — HEMOGLOBIN A1C
Hgb A1c MFr Bld: 8.6 % of total Hgb — ABNORMAL HIGH (ref <5.7)
Mean Plasma Glucose: 200 mg/dL
eAG (mmol/L): 11.1 mmol/L

## 2020-10-03 LAB — TSH: TSH: 1.53 mIU/L (ref 0.40–4.50)

## 2020-10-03 LAB — LIPID PANEL
Cholesterol: 259 mg/dL — ABNORMAL HIGH (ref <200)
HDL: 48 mg/dL — ABNORMAL LOW (ref >=50)
LDL Cholesterol (Calc): 180 mg/dL (calc) — ABNORMAL HIGH
Non-HDL Cholesterol (Calc): 211 mg/dL (calc) — ABNORMAL HIGH (ref <130)
Total CHOL/HDL Ratio: 5.4 (calc) — ABNORMAL HIGH (ref <5.0)
Triglycerides: 163 mg/dL — ABNORMAL HIGH (ref <150)

## 2020-10-05 ENCOUNTER — Ambulatory Visit: Payer: Self-pay

## 2020-10-05 ENCOUNTER — Telehealth: Payer: Self-pay

## 2020-10-05 ENCOUNTER — Other Ambulatory Visit: Payer: Self-pay

## 2020-10-05 ENCOUNTER — Encounter: Payer: Self-pay | Admitting: Orthopedic Surgery

## 2020-10-05 ENCOUNTER — Ambulatory Visit (INDEPENDENT_AMBULATORY_CARE_PROVIDER_SITE_OTHER): Payer: Medicare Other | Admitting: Orthopedic Surgery

## 2020-10-05 VITALS — Ht 65.0 in | Wt 152.0 lb

## 2020-10-05 DIAGNOSIS — M25562 Pain in left knee: Secondary | ICD-10-CM

## 2020-10-05 DIAGNOSIS — J4520 Mild intermittent asthma, uncomplicated: Secondary | ICD-10-CM | POA: Diagnosis not present

## 2020-10-05 DIAGNOSIS — I10 Essential (primary) hypertension: Secondary | ICD-10-CM | POA: Diagnosis not present

## 2020-10-05 DIAGNOSIS — M25552 Pain in left hip: Secondary | ICD-10-CM | POA: Diagnosis not present

## 2020-10-05 DIAGNOSIS — R072 Precordial pain: Secondary | ICD-10-CM | POA: Diagnosis not present

## 2020-10-05 DIAGNOSIS — F5101 Primary insomnia: Secondary | ICD-10-CM | POA: Diagnosis not present

## 2020-10-05 DIAGNOSIS — G8929 Other chronic pain: Secondary | ICD-10-CM | POA: Diagnosis not present

## 2020-10-05 NOTE — Telephone Encounter (Signed)
Patient called back upset and says she will not be returning to our office.

## 2020-10-05 NOTE — Telephone Encounter (Signed)
Patient didn't answer the phone. Voicemail was left for patient to return call to office.

## 2020-10-05 NOTE — Telephone Encounter (Signed)
Patient called stating that she needs penicillin, estrogen cream and potassium called in. Neither of these medications are showing on her medication list. She stated that penicillin was stolen. She states that she takes potassium 10 meq CL twice a day. Amlodipine was filled in Feb. With 3 refills. Please advise  Message routed to Marlowe Sax, NP

## 2020-10-05 NOTE — Telephone Encounter (Signed)
Has refill on amlodipine just need to call Pharmacy.Potassium level are normal does not need potassium supplement.

## 2020-10-05 NOTE — Telephone Encounter (Signed)
Recommend to provide a urine specimen if having burning to rule out urinary tract infection prior to prescribing estrogen cream.

## 2020-10-05 NOTE — Telephone Encounter (Signed)
Patient called stating she is still burning in vagina/rectum area. She need a new prescription for her estrogen if she should use it and amlodipine since "someone stole it". She also says she was to get Pinicillin called in at her apnt but it wasn't. She can be reached at [phone] and would prescriptions sent to [facility] [address] although she will be switching her pharmacy soon. She would prefer before 12 noon so son can pick it up since she has moved. To [employer]

## 2020-10-05 NOTE — Telephone Encounter (Signed)
Called patient and she states that she already has appointment with ophthalmologist so they're gonna fix it. She states that she doesn't know why I'm calling her. I tried to transfer patient to [name] to discuss other concerns. Patient hung the phone up on me. I called patient back on the phone and she agreed to speak to [name] about vaginal concerns.

## 2020-10-06 ENCOUNTER — Ambulatory Visit: Payer: Medicare Other | Admitting: Internal Medicine

## 2020-10-06 ENCOUNTER — Telehealth: Payer: Self-pay | Admitting: *Deleted

## 2020-10-06 NOTE — Telephone Encounter (Signed)
[name] dropped off a PCS Form on Clinical Intake Desk from [facility] [phone] Fax: [phone] Request for patient requesting PCS.  Medicaid ID [id number] P Form filled out and faxed back.  Copy sent for scanning.

## 2020-10-07 NOTE — Telephone Encounter (Signed)
Patient called wanting to know why her appointment for 10/06/20 was canceled and was wanting to reschedule.  Informed patient unable to schedule; demanded to speak to [facility].  Informed patient unable to transfer to nurse.  Patient stated will be suing Dr. Hilarie Fredrickson and the office then hung up the phone.

## 2020-10-08 ENCOUNTER — Telehealth: Payer: Self-pay | Admitting: *Deleted

## 2020-10-08 ENCOUNTER — Other Ambulatory Visit: Payer: Self-pay

## 2020-10-08 MED ORDER — SITAGLIPTIN PHOSPHATE 50 MG PO TABS: 50.0000 mg | ORAL_TABLET | Freq: Every day | ORAL | 1 refills | Status: DC

## 2020-10-08 MED ORDER — ROSUVASTATIN CALCIUM 40 MG PO TABS: 40.0000 mg | ORAL_TABLET | Freq: Every day | ORAL | 3 refills | Status: DC

## 2020-10-08 NOTE — Telephone Encounter (Signed)
Patient daughter, [name] Long, called and stated that she has HCPOA over patient and she would like to speak with you directly regarding patient.  All daughter would tell me is that she would like to be called first before any medication is called to the pharmacy for patient.  Daughter stated that she wants to speak with you personally regarding patient's medications and wants you to call her at [phone].

## 2020-10-08 NOTE — Telephone Encounter (Signed)
Spoke with patient' daughter [name] who is the HPOA request to be placed first contact person when medication are prescribed states patient was prescribed Ibuprofen unclear whether from the hospital which she is allergic. Will d/c ibuprofen from medication list.

## 2020-10-09 ENCOUNTER — Telehealth: Payer: Self-pay | Admitting: Orthopedic Surgery

## 2020-10-09 ENCOUNTER — Telehealth: Payer: Self-pay

## 2020-10-09 ENCOUNTER — Other Ambulatory Visit: Payer: Self-pay | Admitting: Physician Assistant

## 2020-10-09 MED ORDER — CYCLOBENZAPRINE HCL 10 MG PO TABS: 5.0000 mg | ORAL_TABLET | Freq: Three times a day (TID) | ORAL | 0 refills | Status: DC | PRN

## 2020-10-09 NOTE — Telephone Encounter (Signed)
Can you advise? 

## 2020-10-09 NOTE — Telephone Encounter (Signed)
Patient called she stated due to her being in pain she hasn't been able to sleep..  patient is requesting a rx that helps her sleep better patient is also requesting a call back when rx has been sent to the pharmacy call [phone]

## 2020-10-09 NOTE — Telephone Encounter (Signed)
I spoke with the patient's daughter [name] who is her legal guardian.  She wants it to be sure that ibuprofen is listed as an allergy on her chart and I will check this.  Also is agreeable to giving her mother some Flexeril.  She reiterated that if we prescribe anything she wants to be made aware.  She does have a voicemail on this number and is fine with [place] leaving her message as she is often at work please let patient know we will be calling in Flexeril

## 2020-10-09 NOTE — Telephone Encounter (Signed)
Pt called stating she needs her hydrocodone 325 called in because she's in a lot of pain. She states she spoke with Dr. Sharol Given and he said he would call it in for her. Pt would like a CB when this is done.    [phone]

## 2020-10-09 NOTE — Telephone Encounter (Signed)
Pls advise.  

## 2020-10-09 NOTE — Telephone Encounter (Signed)
I left voicemail advising. ?

## 2020-10-10 ENCOUNTER — Other Ambulatory Visit: Payer: Self-pay | Admitting: Family

## 2020-10-10 DIAGNOSIS — I10 Essential (primary) hypertension: Secondary | ICD-10-CM

## 2020-10-12 ENCOUNTER — Encounter: Payer: Self-pay | Admitting: Orthopedic Surgery

## 2020-10-12 NOTE — Progress Notes (Signed)
Office Visit Note   Patient: [name] Long           Date of Birth: 1951-02-07           MRN: [id number] Visit Date: 10/05/2020              Requested by: Sandrea Hughs, NP [address] [place],  [place] [address] PCP: Sandrea Hughs, NP  Chief Complaint  Patient presents with  . Left Leg - Pain  . Right Leg - Pain      HPI: Patient is a 70 year old woman who states she fell last month landing on her left side.  She states she has pain that wakes her up every 3 hours.  She states she has neck and back pain.  She states she did not get her prednisone filled that she is awaiting further information from her cardiologist.  Patient states she wants a shot in all of her joints.  She states she broke her left leg from her hip to her toes.  She is currently using Vicodin and ibuprofen status post a tooth extraction.  Assessment & Plan: Visit Diagnoses:  1. Pain in left hip   2. Chronic pain of left knee     Plan: Examination patient has abduction and flexion of both shoulders to 90 degrees.  There is no crepitation or pain with range of motion.  She can stand without pain.  Patient has no focal motor weakness in upper or lower extremities.  Pain 2 view radiographs of the left knee shows bone-on-bone contact of the medial joint line with subcondylar sclerosis  Follow-Up Instructions: No follow-ups on file.   Ortho Exam  Patient is alert, oriented, no adenopathy, well-dressed, normal affect, normal respiratory effort. Examination patient has pain with passive range of motion of all joints.  She has pain with range of motion of the left knee no focal medial or lateral joint line pain.  Patient has full range of motion of the left hip with a negative straight leg raise.  No focal motor weakness.  Imaging: No results found. No images are attached to the encounter.  Labs: Lab Results  Component Value Date   HGBA1C 8.6 (H) 10/01/2020   HGBA1C 8.1 (H) 05/26/2020   HGBA1C 10.3 (A)  03/23/2020   LABORGA NO GROWTH 12/04/2014     Lab Results  Component Value Date   ALBUMIN 3.1 (L) 09/21/2020   ALBUMIN 3.2 (L) 09/02/2020   ALBUMIN 3.3 (L) 04/03/2020    Lab Results  Component Value Date   MG 1.7 09/04/2020   MG 1.7 12/25/2016   MG 1.8 06/10/2016   Lab Results  Component Value Date   VD25OH 43.0 02/23/2018   VD25OH 30 05/06/2015   VD25OH 12 (L) 12/18/2014    No results found for: PREALBUMIN CBC EXTENDED Latest Ref Rng & Units 10/01/2020 09/21/2020 09/03/2020  WBC 3.8 - 10.8 Thousand/uL 6.2 5.0 -  RBC 3.80 - 5.10 Million/uL 3.62(L) 3.32(L) -  HGB 11.7 - 15.5 g/dL 11.4(L) 10.5(L) 11.9(L)  HCT 35.0 - 45.0 % 34.7(L) 31.0(L) 35.0(L)  PLT 140 - 400 Thousand/uL 242 229 -  NEUTROABS 1,500 - 7,800 cells/uL 3,032 - -  LYMPHSABS 850 - 3,900 cells/uL 2,108 - -     Body mass index is 25.29 kg/m.  Orders:  Orders Placed This Encounter  Procedures  . XR HIP UNILAT W OR W/O PELVIS 2-3 VIEWS LEFT  . XR Knee 1-2 Views Left   No orders  of the defined types were placed in this encounter.    Procedures: No procedures performed  Clinical Data: No additional findings.  ROS:  All other systems negative, except as noted in the HPI. Review of Systems  Objective: Vital Signs: Ht '5\' 5"'  (1.651 m)   Wt 152 lb (68.9 kg)   BMI 25.29 kg/m   Specialty Comments:  No specialty comments available.  PMFS History: Patient Active Problem List   Diagnosis Date Noted  . Bipolar I disorder, most recent episode depressed, severe w psychosis ([place])   . Dementia associated with other underlying disease with behavioral disturbance ([place]) 03/27/2020  . GERD (gastroesophageal reflux disease) 12/27/2019  . Post-menopausal atrophic vaginitis 12/13/2019  . Vagina, candidiasis 12/10/2019  . Vaginal itching 11/10/2019  . Dysphagia 07/02/2019  . Screening examination for STD (sexually transmitted disease) 03/25/2019  . 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 Other chronic pain 12/03/2018   . Dizziness of unknown cause 08/04/2018  . Tremor 07/15/2018  . Atrial flutter with rapid ventricular response ([place]) 07/10/2017  . Atrial fibrillation ([place]) 07/05/2017  . COPD (chronic obstructive pulmonary disease) ([place]) 01/22/2017  . Pedal edema   . Chronic bilateral low back pain without sciatica 08/15/2016  . 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 Schizoaffective disorder ([place])   . Hypokalemia 01/22/2015  . Osteoarthritis, multiple sites 06/08/2011  . Dysuria 02/14/2011  . CHRONIC KIDNEY DISEASE STAGE II (MILD) 09/14/2009  . Hypothyroidism 08/31/2006  . Type 2 diabetes mellitus ([place]) 08/31/2006  . HYPERCHOLESTEROLEMIA 08/31/2006  . Tobacco abuse 08/31/2006  . HYPERTENSION, BENIGN SYSTEMIC 08/31/2006   Past Medical History:  Diagnosis Date  . Abscessed tooth 02/09/2020  . Acute on chronic congestive heart failure ([place])   . Adenomatous colon polyp   . Arthritis    "real bad; all over" (07/12/2017)  . Asthma   . Atrial fibrillation ([place])   . BIPOLAR DISORDER 08/31/2006   Qualifier: Diagnosis of  By: Dorathy Daft MD, Marjory Lies    . CHRONIC KIDNEY DISEASE STAGE II (MILD) 09/14/2009   Annotation: eGFR 64 Qualifier: Diagnosis of  By: Jess Barters MD, [name]    . Chronic lower back pain   . Congestive heart failure (CHF) ([place])   . COPD (chronic obstructive pulmonary disease) ([place]) 09/23/2010   Diagnosed at [facility] in 2008 (Dr. Annamaria Boots)   . DM (diabetes mellitus) type II controlled with renal manifestation ([place]) 08/31/2006   Qualifier: Diagnosis of  By: Dorathy Daft MD, Marjory Lies    . Dyspnea    "all my life; since 6th grade" (07/12/2017)  . Fatty liver   . HYPERCHOLESTEROLEMIA 08/31/2006   Intolerance to Lipitor OK on Crestor but medicaid no longer covering    . HYPERTENSION, BENIGN SYSTEMIC 08/31/2006   Qualifier: Diagnosis of  By: Dorathy Daft MD, Marjory Lies    . Hypokalemia   . HYPOTHYROIDISM, UNSPECIFIED 08/31/2006   Qualifier: Diagnosis of  By:  Dorathy Daft MD, Marjory Lies    . Leg swelling 03/14/2018  . Pneumonia    "3 times" (07/12/2017)  . Pulmonary nodule   . Renal cyst   . Schizophrenia ([place])   . Scoliosis   . Stomach problems   . Thyroid disorder     Family History  Problem Relation Age of Onset  . Breast cancer Sister   . Heart disease Mother 68  . Rectal cancer Mother   . Diabetes Father 19  . Hypertension Father   . Heart disease Brother   . Asthma Daughter   . Asthma  Son   . Breast cancer Sister   . Asthma Daughter   . Colon cancer Maternal Grandmother   . Stomach cancer Neg Hx   . Esophageal cancer Neg Hx   . Pancreatic cancer Neg Hx     Past Surgical History:  Procedure Laterality Date  . CESAREAN SECTION    . FOOT FRACTURE SURGERY Right    "steel plate in it"  . FOOT SURGERY     " born w/dislocated foot"  . FRACTURE SURGERY    . KNEE ARTHROSCOPY Right   . TOE SURGERY Bilateral    "both pinky toes"  . TONSILLECTOMY AND ADENOIDECTOMY     Social History   Occupational History  . Not on file  Tobacco Use  . Smoking status: Former Smoker    Packs/day: 0.25    Years: 45.00    Pack years: 11.25    Types: Cigarettes  . Smokeless tobacco: Never Used  . Tobacco comment: patient states she smokes 1 cigarette per month  Vaping Use  . Vaping Use: Never used  Substance and Sexual Activity  . Alcohol use: Not Currently    Comment: 07/12/2017 "stopped 2 yr ago; did have a drink over the holidays recently"  . Drug use: No  . Sexual activity: Not Currently

## 2020-10-13 ENCOUNTER — Telehealth: Payer: Self-pay

## 2020-10-13 ENCOUNTER — Telehealth: Payer: Self-pay | Admitting: Family

## 2020-10-13 NOTE — Telephone Encounter (Signed)
Noted.Keep appointment for next week.

## 2020-10-13 NOTE — Telephone Encounter (Signed)
Call patient she is on a lot of medications that would interact with sleep medication.  She would need to request sleep medication from her primary care physician.
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
## 2020-10-13 NOTE — Telephone Encounter (Signed)
Recommend Extra Strength Tylenol due to your high kidney function need to avoid NSAID's medication which can worsen kidney function.Mobic is one of the antiinflammatory medication.

## 2020-10-13 NOTE — Telephone Encounter (Signed)
Pt called requesting rf for some of her medications like Movic and requesting antibiotics. She stated that her ankles are swollen and she is in pain. Pt was advised that we can no longer provide any medical advise and I offered to transfer her to speak with [facility] Director but she refused. She is aware that I was going to document her call on her chart. However, I did not promise that her call was going to be returned. She voiced understanding.

## 2020-10-13 NOTE — Telephone Encounter (Signed)
Called patient back "[name] Long" patient daughter answered the phone. She states that she will give message to patient.

## 2020-10-13 NOTE — Telephone Encounter (Signed)
Called patient back "Cassandra" patient daughter answered the phone. She states that she will give message to patient.

## 2020-10-13 NOTE — Telephone Encounter (Signed)
Patient called and states that she needs refill on her penicillins because she was told by PCP Ngetich, [name] C, NP that it would help with her vaginal issues. Message routed to PCP Ngetich, Nelda Bucks, NP . Please Advise.

## 2020-10-13 NOTE — Telephone Encounter (Signed)
I called pt and advised of message below. She was very upset and said that she is having pain in her back and that the flexeril that we gave on Friday is not helping and that she does not have a POA and that she does not want [place] to believe her daughter but to believe her that the pain is great and that she needs something stronger. Pt wanted appt moved unto next week this was sch for her for Monday 18th at 2:15 advised to use heating pad and to use the flexeril and see if this will help until her appt.

## 2020-10-13 NOTE — Telephone Encounter (Signed)
Patient called back and states that we didn't call her because her phone didn't ring. Patient was informed that I spoke to her POA daughter "[name]" because that's who's marked as primary contact. I told patient I would get front desk to change contact number for her. Also notified patient what was stated by PCP Ngetich, Nelda Bucks, NP . Patient states that she has appointments next week and can come into office on Thursday 10/22/2020 for examination and urine check. Message routed back to PCP Ngetich, Nelda Bucks, NP

## 2020-10-13 NOTE — Telephone Encounter (Signed)
If having any symptoms of urinary tract infection will need to come and provide urine specimen to check for infection prior to prescribing antibiotics.

## 2020-10-19 ENCOUNTER — Ambulatory Visit (INDEPENDENT_AMBULATORY_CARE_PROVIDER_SITE_OTHER): Payer: Medicare Other | Admitting: Orthopedic Surgery

## 2020-10-19 DIAGNOSIS — G894 Chronic pain syndrome: Secondary | ICD-10-CM

## 2020-10-19 MED ORDER — IBUPROFEN 800 MG PO TABS: 800.0000 mg | ORAL_TABLET | Freq: Three times a day (TID) | ORAL | 1 refills | Status: DC | PRN

## 2020-10-20 ENCOUNTER — Encounter: Payer: Self-pay | Admitting: Orthopedic Surgery

## 2020-10-20 ENCOUNTER — Other Ambulatory Visit: Payer: Self-pay | Admitting: *Deleted

## 2020-10-20 DIAGNOSIS — E1142 Type 2 diabetes mellitus with diabetic polyneuropathy: Secondary | ICD-10-CM

## 2020-10-20 MED ORDER — ONETOUCH VERIO VI STRP: ORAL_STRIP | 11 refills | Status: DC

## 2020-10-20 NOTE — Progress Notes (Signed)
Office Visit Note   Patient: [name] Long           Date of Birth: [birth year]           MRN: [id number] Visit Date: 10/19/2020              Requested by: Sandrea Hughs, NP [address] [place],  [place] [address] PCP: Sandrea Hughs, NP  Chief Complaint  Patient presents with  . Right Shoulder - Pain  . Left Shoulder - Pain  . Right Knee - Pain  . Left Knee - Pain      HPI: Patient is a 70 year old woman who presents complaining of pain in all of her joints from the top of her head to the bottom of her feet.  Patient states that ibuprofen wears off after 3 hours.  Patient requests a lumbar spine brace and a sling for the left arm and states that she would like an injection in most of her joints.  She is status post hyaluronic acid injections for both knees as well as recent injections for both shoulders.  Patient states she has a crack in her spine decreased range of motion of her neck and states that her left ankle feels broken.  Assessment & Plan: Visit Diagnoses:  1. Chronic pain syndrome     Plan: Reviewed recommendations for exercise she states she does exercise at home we will refill her prescription for ibuprofen.  Discussed possibility of referral to a pain clinic and patient states that she has been discharged from multiple pain clinics.  Patient states that her aide has been canceled and she is working on getting her a reinstated.  A prescription for ibuprofen was called in.  Follow-Up Instructions: Return if symptoms worsen or fail to improve.   Ortho Exam  Patient is alert, oriented, no adenopathy, well-dressed, normal affect, normal respiratory effort. Examination patient has no restrictions with range of motion of her neck shoulders and arms.  She has a negative straight leg raise bilaterally.  No focal motor weakness in either upper or lower extremities.  Recommended against further injections for her joints due to the multiple injections and recommend  against using a sling for the left arm.  Discussed that she could go to a medical supply store to obtain a back brace.  Discussed that this is generally not covered by insurance.  Imaging: No results found. No images are attached to the encounter.  Labs: Lab Results  Component Value Date   HGBA1C 8.6 (H) 10/01/2020   HGBA1C 8.1 (H) 05/26/2020   HGBA1C 10.3 (A) 03/23/2020   LABORGA NO GROWTH 12/04/2014     Lab Results  Component Value Date   ALBUMIN 3.1 (L) 09/21/2020   ALBUMIN 3.2 (L) 09/02/2020   ALBUMIN 3.3 (L) 04/03/2020    Lab Results  Component Value Date   MG 1.7 09/04/2020   MG 1.7 12/25/2016   MG 1.8 06/10/2016   Lab Results  Component Value Date   VD25OH 43.0 02/23/2018   VD25OH 30 05/06/2015   VD25OH 12 (L) 12/18/2014    No results found for: PREALBUMIN CBC EXTENDED Latest Ref Rng & Units 10/01/2020 09/21/2020 09/03/2020  WBC 3.8 - 10.8 Thousand/uL 6.2 5.0 -  RBC 3.80 - 5.10 Million/uL 3.62(L) 3.32(L) -  HGB 11.7 - 15.5 g/dL 11.4(L) 10.5(L) 11.9(L)  HCT 35.0 - 45.0 % 34.7(L) 31.0(L) 35.0(L)  PLT 140 - 400 Thousand/uL 242 229 -  NEUTROABS 1,500 - 7,800 cells/uL 3,032 - -  LYMPHSABS 850 - 3,900 cells/uL 2,108 - -     There is no height or weight on file to calculate BMI.  Orders:  No orders of the defined types were placed in this encounter.  Meds ordered this encounter  Medications  . ibuprofen (ADVIL) 800 MG tablet    Sig: Take 1 tablet (800 mg total) by mouth every 8 (eight) hours as needed.    Dispense:  30 tablet    Refill:  1     Procedures: No procedures performed  Clinical Data: No additional findings.  ROS:  All other systems negative, except as noted in the HPI. Review of Systems  Objective: Vital Signs: There were no vitals taken for this visit.  Specialty Comments:  No specialty comments available.  PMFS History: Patient Active Problem List   Diagnosis Date Noted  . Bipolar I disorder, most recent episode depressed,  severe w psychosis ([place])   . Dementia associated with other underlying disease with behavioral disturbance ([place]) 03/27/2020  . GERD (gastroesophageal reflux disease) 12/27/2019  . Post-menopausal atrophic vaginitis 12/13/2019  . Vagina, candidiasis 12/10/2019  . Vaginal itching 11/10/2019  . Dysphagia 07/02/2019  . Screening examination for STD (sexually transmitted disease) 03/25/2019  . 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 Other chronic pain 12/03/2018  . Dizziness of unknown cause 08/04/2018  . Tremor 07/15/2018  . Atrial flutter with rapid ventricular response ([place]) 07/10/2017  . Atrial fibrillation ([place]) 07/05/2017  . COPD (chronic obstructive pulmonary disease) ([place]) 01/22/2017  . Pedal edema   . Chronic bilateral low back pain without sciatica 08/15/2016  . 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 Schizoaffective disorder ([place])   . Hypokalemia 01/22/2015  . Osteoarthritis, multiple sites 06/08/2011  . Dysuria 02/14/2011  . CHRONIC KIDNEY DISEASE STAGE II ([place]) 09/14/2009  . Hypothyroidism 08/31/2006  . Type 2 diabetes mellitus ([place]) 08/31/2006  . HYPERCHOLESTEROLEMIA 08/31/2006  . Tobacco abuse 08/31/2006  . HYPERTENSION, BENIGN SYSTEMIC 08/31/2006   Past Medical History:  Diagnosis Date  . Abscessed tooth 02/09/2020  . Acute on chronic congestive heart failure ([place])   . Adenomatous colon polyp   . Arthritis    "real bad; all over" (07/12/2017)  . Asthma   . Atrial fibrillation ([place])   . BIPOLAR DISORDER 08/31/2006   Qualifier: Diagnosis of  By: Dorathy Daft MD, Marjory Lies    . CHRONIC KIDNEY DISEASE STAGE II (M[place]) 09/14/2009   Annotation: eGFR 64 Qualifier: Diagnosis of  By: Jess Barters MD, [name]    . Chronic lower back pain   . Congestive heart failure (CHF) ([place])   . COPD (chronic obstructive pulmonary disease) ([place]) 09/23/2010   Diagnosed at [facility] in 2008 (Dr. Annamaria Boots)   . DM (diabetes mellitus) type II controlled with renal  manifestation ([place]) 08/31/2006   Qualifier: Diagnosis of  By: Dorathy Daft MD, Marjory Lies    . Dyspnea    "all my life; since 6th grade" (07/12/2017)  . Fatty liver   . HYPERCHOLESTEROLEMIA 08/31/2006   Intolerance to Lipitor OK on Crestor but medicaid no longer covering    . HYPERTENSION, BENIGN SYSTEMIC 08/31/2006   Qualifier: Diagnosis of  By: Dorathy Daft MD, Marjory Lies    . Hypokalemia   . HYPOTHYROIDISM, UNSPECIFIED 08/31/2006   Qualifier: Diagnosis of  By: Dorathy Daft MD, Marjory Lies    . Leg swelling 03/14/2018  . Pneumonia    "3 times" (07/12/2017)  . Pulmonary nodule   . Renal cyst   . Schizophrenia ([place])   . Scoliosis   .  Stomach problems   . Thyroid disorder     Family History  Problem Relation Age of Onset  . Breast cancer Sister   . Heart disease Mother 61  . Rectal cancer Mother   . Diabetes Father 28  . Hypertension Father   . Heart disease Brother   . Asthma Daughter   . Asthma Son   . Breast cancer Sister   . Asthma Daughter   . Colon cancer Maternal Grandmother   . Stomach cancer Neg Hx   . Esophageal cancer Neg Hx   . Pancreatic cancer Neg Hx     Past Surgical History:  Procedure Laterality Date  . CESAREAN SECTION    . FOOT FRACTURE SURGERY Right    "steel plate in it"  . FOOT SURGERY     " born w/dislocated foot"  . FRACTURE SURGERY    . KNEE ARTHROSCOPY Right   . TOE SURGERY Bilateral    "both pinky toes"  . TONSILLECTOMY AND ADENOIDECTOMY     Social History   Occupational History  . Not on file  Tobacco Use  . Smoking status: Former Smoker    Packs/day: 0.25    Years: 45.00    Pack years: 11.25    Types: Cigarettes  . Smokeless tobacco: Never Used  . Tobacco comment: patient states she smokes 1 cigarette per month  Vaping Use  . Vaping Use: Never used  Substance and Sexual Activity  . Alcohol use: Not Currently    Comment: 07/12/2017 "stopped 2 yr ago; did have a drink over the holidays recently"  . Drug use: No  . Sexual activity: Not Currently

## 2020-10-20 NOTE — Telephone Encounter (Signed)
Pharmacy sent refill request stating that patient is testing 2-3 times daily and needs updated Rx.

## 2020-10-22 ENCOUNTER — Ambulatory Visit: Payer: Self-pay | Admitting: Family

## 2020-10-23 ENCOUNTER — Telehealth: Payer: Self-pay

## 2020-10-23 ENCOUNTER — Other Ambulatory Visit: Payer: Self-pay | Admitting: Family

## 2020-10-23 ENCOUNTER — Other Ambulatory Visit: Payer: Self-pay | Admitting: *Deleted

## 2020-10-23 MED ORDER — ALBUTEROL SULFATE HFA 108 (90 BASE) MCG/ACT IN AERS: 2.0000 | INHALATION_SPRAY | Freq: Four times a day (QID) | RESPIRATORY_TRACT | 1 refills | Status: DC | PRN

## 2020-10-23 MED ORDER — ALBUTEROL SULFATE 1.25 MG/3ML IN NEBU: 1.0000 | INHALATION_SOLUTION | Freq: Four times a day (QID) | RESPIRATORY_TRACT | 1 refills | Status: DC | PRN

## 2020-10-23 NOTE — Telephone Encounter (Signed)
Refill Albuterol as requested.No recent antibiotics ordered.she missed her appointment yesterday.will need to be seen in the office if having any symptoms.

## 2020-10-23 NOTE — Telephone Encounter (Signed)
Patient called she is requesting rx refill for hydrocodone patient stated she has been having severe pain in her chest due to not having any hydrocodone, patient stated she has been taking ibuprofen which isn't touching the pain so she has stopped taking them today patient also stated she doesn't want any rx to be prescribed that arent going to help. Patient stated Dr.Duda is being rude by not trying to her help and she stated hydrocodone needs to be listed as "necessary necessity" in her chart and also stated she has talked to the board.. patient is requesting a call back when rx has been sent to the pharmacy call [phone] ** patient specifically stated she doesn't not want a call back from [place].

## 2020-10-23 NOTE — Telephone Encounter (Signed)
Advise on #2?

## 2020-10-23 NOTE — Telephone Encounter (Signed)
I called the pt and advised that I work with Dr. Sharol Given and I will always be the one that calls her back. She was in the office last week and Dr. Sharol Given had advised tht he can not treat her with narcotic pain medication. The pt states that the state will fill her rx if we put medically necessary a the reason why she must have it and that she is hyperventilating because of the pain and that the pain is causing her to have pressure in her heart and lungs and I asked if she was having difficulty breathing ( she is not demonstrating any difficulty during our conversation) and she said that any one who works in medicine knows that when you are in pain that it sends a nerve to your heart and that's what causes her to have pain. Pt advised that she will not be coming back to our office if Dr. Sharol Given will not refill her pain medication and I advised if she changed her mind to call back and let us know and the pt disconnected the call.

## 2020-10-23 NOTE — Telephone Encounter (Signed)
Patient called requesting refill.   1. Wants her Albuterol Inhaler and Albuterol 2.5 Nebulizer Solution. The Nebulizer solution is NOT in her current medication list but she states that she has been taking it.   2. Patient had fell in [place] and stated that you know about it. Stated that she has been taking Hydrocodone to help the pain and help her sleep. Requesting a refill. Dr. Sharol Given has prescribed this and I informed her that she would need to call his office and she stated that she did but hasn't refilled it. Stated that it has to medically necessary.   3. Patient also stated that the Antibiotic you prescribed last for her was never sent to the pharmacy and she needs it sent.    Please Advise.

## 2020-10-26 ENCOUNTER — Other Ambulatory Visit: Payer: Self-pay | Admitting: *Deleted

## 2020-10-26 MED ORDER — ALBUTEROL SULFATE 1.25 MG/3ML IN NEBU: 1.0000 | INHALATION_SOLUTION | Freq: Four times a day (QID) | RESPIRATORY_TRACT | 1 refills | Status: DC | PRN

## 2020-10-26 NOTE — Telephone Encounter (Signed)
Pharmacy called and stated that the Nebulizer solution prescribed by [name] on Friday needs a Dx code for insurance to cover.

## 2020-10-27 ENCOUNTER — Telehealth: Payer: Medicare Other | Admitting: Family

## 2020-10-27 DIAGNOSIS — I129 Hypertensive chronic kidney disease with stage 1 through stage 4 chronic kidney disease, or unspecified chronic kidney disease: Secondary | ICD-10-CM

## 2020-10-27 DIAGNOSIS — N183 Chronic kidney disease, stage 3 unspecified: Secondary | ICD-10-CM

## 2020-10-27 MED ORDER — POTASSIUM CHLORIDE CRYS ER 10 MEQ PO TBCR: 10.0000 meq | EXTENDED_RELEASE_TABLET | Freq: Two times a day (BID) | ORAL | 3 refills | Status: DC

## 2020-10-27 MED ORDER — AMLODIPINE BESYLATE 5 MG PO TABS: 5.0000 mg | ORAL_TABLET | Freq: Every day | ORAL | 1 refills | Status: DC

## 2020-10-27 NOTE — Telephone Encounter (Signed)
Patient called and said she needed a refill on her blood pressure & potassium medicine.  She said she needs it to live.   [name] Long phone #  [phone]

## 2020-10-27 NOTE — Telephone Encounter (Signed)
Potassium chloride 10 meq tablet was not discontinued by provider looks like patient reported taking one by mouth twice daily. Restart Potassium chloride 10 meq tablet one by mouth twice daily.Script send to pharmacy.

## 2020-10-27 NOTE — Addendum Note (Signed)
Addended byMarlowe Sax C on: 10/27/2020 10:11 PM   Modules accepted: Orders

## 2020-10-27 NOTE — Addendum Note (Signed)
Addended by: Logan Bores on: 10/27/2020 01:18 PM   Modules accepted: Orders

## 2020-10-27 NOTE — Telephone Encounter (Signed)
I called patients daughter [name] to discuss request and to confirm pharmacy as patient has 3 on file. Potassium is no longer on patients medication list and Cassandra states she does not know why it was removed but she feels patient should be on potassium.  Potassium was removed on 10/01/2020 when patient seen [name]. Message will be forwarded to [place] to review and advise if patient to resume Potassium 10 meq, 1 by mouth daily   Please advise   Pharmacy confirmed as [facility]

## 2020-10-28 ENCOUNTER — Encounter: Payer: Self-pay | Admitting: *Deleted

## 2020-10-28 DIAGNOSIS — R072 Precordial pain: Secondary | ICD-10-CM | POA: Diagnosis not present

## 2020-10-28 DIAGNOSIS — E119 Type 2 diabetes mellitus without complications: Secondary | ICD-10-CM | POA: Diagnosis not present

## 2020-10-28 DIAGNOSIS — E1165 Type 2 diabetes mellitus with hyperglycemia: Secondary | ICD-10-CM | POA: Diagnosis not present

## 2020-10-28 DIAGNOSIS — H0100A Unspecified blepharitis right eye, upper and lower eyelids: Secondary | ICD-10-CM | POA: Diagnosis not present

## 2020-10-28 DIAGNOSIS — J4520 Mild intermittent asthma, uncomplicated: Secondary | ICD-10-CM | POA: Diagnosis not present

## 2020-10-28 DIAGNOSIS — H5213 Myopia, bilateral: Secondary | ICD-10-CM | POA: Diagnosis not present

## 2020-10-28 DIAGNOSIS — H2513 Age-related nuclear cataract, bilateral: Secondary | ICD-10-CM | POA: Diagnosis not present

## 2020-10-28 DIAGNOSIS — I10 Essential (primary) hypertension: Secondary | ICD-10-CM | POA: Diagnosis not present

## 2020-10-28 LAB — HM DIABETES EYE EXAM

## 2020-10-28 NOTE — Addendum Note (Signed)
Addended by: Logan Bores on: 10/28/2020 08:30 AM   Modules accepted: Orders

## 2020-10-28 NOTE — Telephone Encounter (Signed)
RX was sent to the pharmacy by the provider

## 2020-10-29 ENCOUNTER — Ambulatory Visit: Payer: Medicare Other | Admitting: Orthopedic Surgery

## 2020-11-02 ENCOUNTER — Other Ambulatory Visit: Payer: Self-pay | Admitting: *Deleted

## 2020-11-02 DIAGNOSIS — I129 Hypertensive chronic kidney disease with stage 1 through stage 4 chronic kidney disease, or unspecified chronic kidney disease: Secondary | ICD-10-CM

## 2020-11-02 DIAGNOSIS — N183 Chronic kidney disease, stage 3 unspecified: Secondary | ICD-10-CM

## 2020-11-02 MED ORDER — AMLODIPINE BESYLATE 5 MG PO TABS: 5.0000 mg | ORAL_TABLET | Freq: Every day | ORAL | 1 refills | Status: DC

## 2020-11-02 NOTE — Telephone Encounter (Signed)
Patient called and stated that the pharmacy never received the Rx for her Amlodipine and asks that we refax it.

## 2020-11-04 ENCOUNTER — Telehealth: Payer: Self-pay | Admitting: Orthopedic Surgery

## 2020-11-04 ENCOUNTER — Other Ambulatory Visit: Payer: Self-pay | Admitting: *Deleted

## 2020-11-04 DIAGNOSIS — I129 Hypertensive chronic kidney disease with stage 1 through stage 4 chronic kidney disease, or unspecified chronic kidney disease: Secondary | ICD-10-CM

## 2020-11-04 DIAGNOSIS — N183 Chronic kidney disease, stage 3 unspecified: Secondary | ICD-10-CM

## 2020-11-04 DIAGNOSIS — E1142 Type 2 diabetes mellitus with diabetic polyneuropathy: Secondary | ICD-10-CM

## 2020-11-04 MED ORDER — FREESTYLE LITE W/DEVICE KIT: 1.0000 | PACK | Freq: Every day | 0 refills | Status: DC

## 2020-11-04 MED ORDER — AMLODIPINE BESYLATE 5 MG PO TABS: 5.0000 mg | ORAL_TABLET | Freq: Every day | ORAL | 1 refills | Status: DC

## 2020-11-04 MED ORDER — FREESTYLE LANCETS MISC: 5 refills | Status: DC

## 2020-11-04 MED ORDER — FREESTYLE LITE TEST VI STRP: ORAL_STRIP | 5 refills | Status: DC

## 2020-11-04 NOTE — Telephone Encounter (Signed)
Patient called. She would like some pain medication called in.
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
## 2020-11-05 ENCOUNTER — Ambulatory Visit: Payer: Medicare Other | Admitting: Orthopedic Surgery

## 2020-11-05 NOTE — Telephone Encounter (Signed)
I tried to call pt no answer and mail box is full. Pt has an appt today will discuss at appt with Dr. Sharol Given.

## 2020-11-10 ENCOUNTER — Ambulatory Visit: Payer: Medicare Other | Admitting: Family

## 2020-11-19 ENCOUNTER — Other Ambulatory Visit: Payer: Medicare Other

## 2020-11-19 ENCOUNTER — Encounter: Payer: Self-pay | Admitting: Orthopedic Surgery

## 2020-11-19 ENCOUNTER — Ambulatory Visit (INDEPENDENT_AMBULATORY_CARE_PROVIDER_SITE_OTHER): Payer: Medicare Other | Admitting: Orthopedic Surgery

## 2020-11-19 ENCOUNTER — Ambulatory Visit: Payer: Medicare Other | Admitting: Orthopedic Surgery

## 2020-11-19 ENCOUNTER — Other Ambulatory Visit: Payer: Self-pay

## 2020-11-19 VITALS — BP 122/74 | HR 83 | Temp 96.6°F | Ht 65.0 in | Wt 156.0 lb

## 2020-11-19 DIAGNOSIS — Z5329 Procedure and treatment not carried out because of patient's decision for other reasons: Secondary | ICD-10-CM

## 2020-11-19 DIAGNOSIS — Z9119 Patient's noncompliance with other medical treatment and regimen: Secondary | ICD-10-CM

## 2020-11-19 DIAGNOSIS — Z91199 Patient's noncompliance with other medical treatment and regimen due to unspecified reason: Secondary | ICD-10-CM

## 2020-11-19 NOTE — Progress Notes (Signed)
Careteam: Patient Care Team: Ngetich, Nelda Bucks, NP as PCP - General (Family Medicine) Newt Minion, MD as Consulting Physician (Orthopedic Surgery) Dixie Dials, MD as Consulting Physician (Cardiology)  Seen by: Windell Moulding, AGNP-C  PLACE OF SERVICE:  [place] Directive information    Allergies  Allergen Reactions  . Citrus Anaphylaxis and Itching  . Fish Allergy Anaphylaxis    Cod  . Shellfish Allergy Shortness Of Breath and Other (See Comments)    "Affects thyroid" also  . Adhesive [Tape] Other (See Comments)    Must have paper tape only  . Ibuprofen Swelling    Face swells  . Latex Dermatitis  . Lipitor [Atorvastatin Calcium] Other (See Comments)    Body aches  . Lisinopril Other (See Comments)    PER DR. Melvyn Novas (not recalled by patient)  . Other Nausea Only and Other (See Comments)    Collards (gas, too)  . Codeine Rash  . Tramadol Palpitations    Chief Complaint  Patient presents with  . Acute Visit    Bronchitis and ongoing pain in various area's, requesting refill on hydrocodone. Patient would like to discuss issues concerning her womb.      HPI: Patient is a 70 y.o. female seen today for medical management of chronic conditions.   Followed by Dr. Sharol Given for chronic joint pain. Pain located in both shoulders and knees. She is using a cane to help her ambulate. No recent falls. She is taking aleve prn for pain. She also takes flexeril prn for tightness. Pain worse in the morning.   Cough- reports chronic bronchitis. She was without her nebulizer machine for 2 months. Just got her nebulizer machine yesterday. She has had one albuterol treatment since receiving machine.    Diabetes- she reports checking her sugar daily at random times. Sugars averaging 120-180's. Taking farixga, metformin and [place].   She was last seen by previous provider 03/31 for pelvic exam and pap smear due to reported vaginal discharge and bleeding. Pap results normal.  Today, she is requesting another pap smear and IUD placement. I explained to patient that IUD procedures are not provided at [facility]. I proceeded to ask her if she was having vaginal symptoms. She became very upset and began to use curse words. She stated " I hope you "blank" die." She began to describe other primary practices she had visited in the past and how they offered pap smears and IUD placement. I recommended referring her to a gynecologist to discuss pap smear and IUD in detail. She became more frustrated with me and said " just give me my "blank" medicine."  At this time she was waving her cane at me. I decided to end the encounter at that time. She left the exam room and waited for her transportation in lobby.    Review of Systems: unable to assess ROS  Past Medical History:  Diagnosis Date  . Abscessed tooth 02/09/2020  . Acute on chronic congestive heart failure ([place])   . Adenomatous colon polyp   . Arthritis    "real bad; all over" (07/12/2017)  . Asthma   . Atrial fibrillation ([place])   . BIPOLAR DISORDER 08/31/2006   Qualifier: Diagnosis of  By: Dorathy Daft MD, Marjory Lies    . CHRONIC KIDNEY DISEASE STAGE II (MILD) 09/14/2009   Annotation: eGFR 64 Qualifier: Diagnosis of  By: Jess Barters MD, [name]    . Chronic lower back pain   . Congestive heart failure (CHF) ([place])   .  COPD (chronic obstructive pulmonary disease) ([place]) 09/23/2010   Diagnosed at [facility] in 2008 (Dr. Annamaria Boots)   . DM (diabetes mellitus) type II controlled with renal manifestation ([place]) 08/31/2006   Qualifier: Diagnosis of  By: Dorathy Daft MD, Marjory Lies    . Dyspnea    "all my life; since 6th grade" (07/12/2017)  . Fatty liver   . HYPERCHOLESTEROLEMIA 08/31/2006   Intolerance to Lipitor OK on Crestor but medicaid no longer covering    . HYPERTENSION, BENIGN SYSTEMIC 08/31/2006   Qualifier: Diagnosis of  By: Dorathy Daft MD, Marjory Lies    . Hypokalemia   . HYPOTHYROIDISM, UNSPECIFIED 08/31/2006   Qualifier: Diagnosis of  By: Dorathy Daft MD, Marjory Lies     . Leg swelling 03/14/2018  . Pneumonia    "3 times" (07/12/2017)  . Pulmonary nodule   . Renal cyst   . Schizophrenia ([place])   . Scoliosis   . Stomach problems   . Thyroid disorder    Past Surgical History:  Procedure Laterality Date  . CESAREAN SECTION    . FOOT FRACTURE SURGERY Right    "steel plate in it"  . FOOT SURGERY     " born w/dislocated foot"  . FRACTURE SURGERY    . KNEE ARTHROSCOPY Right   . TOE SURGERY Bilateral    "both pinky toes"  . TONSILLECTOMY AND ADENOIDECTOMY     Social History:   reports that she has quit smoking. Her smoking use included cigarettes. She has a 11.25 pack-year smoking history. She has never used smokeless tobacco. She reports previous alcohol use. She reports that she does not use drugs.  Family History  Problem Relation Age of Onset  . Breast cancer Sister   . Heart disease Mother 65  . Rectal cancer Mother   . Diabetes Father 49  . Hypertension Father   . Heart disease Brother   . Asthma Daughter   . Asthma Son   . Breast cancer Sister   . Asthma Daughter   . Colon cancer Maternal Grandmother   . Stomach cancer Neg Hx   . Esophageal cancer Neg Hx   . Pancreatic cancer Neg Hx     Medications: Patient's Medications  New Prescriptions   No medications on file  Previous Medications   ALBUTEROL (ACCUNEB) 1.25 MG/3ML NEBULIZER SOLUTION    Take 3 mLs (1.25 mg total) by nebulization every 6 (six) hours as needed for wheezing. Dx: R06.2   ALBUTEROL (VENTOLIN HFA) 108 (90 BASE) MCG/ACT INHALER    Inhale 2 puffs into the lungs 4 (four) times daily as needed for wheezing or shortness of breath.   AMLODIPINE (NORVASC) 5 MG TABLET    Take 1 tablet (5 mg total) by mouth daily.   BLOOD GLUCOSE MONITORING SUPPL (FREESTYLE LITE) W/DEVICE KIT    1 each by Does not apply route daily. Dx:E11.9   BLOOD PRESSURE MONITORING (BLOOD PRESSURE MONITOR/WRIST) KIT    1 Units by Does not apply route daily at 6 (six) AM.   CYCLOBENZAPRINE (FLEXERIL) 10 MG  TABLET    Take 0.5 tablets (5 mg total) by mouth 3 (three) times daily as needed for muscle spasms.   DAPAGLIFLOZIN PROPANEDIOL (FARXIGA) 5 MG TABS TABLET    Take 5 mg by mouth daily.   GLUCOSE BLOOD (FREESTYLE LITE) TEST STRIP    Use to test blood sugar one daily. Dx: E11.9   GLUCOSE BLOOD (ONETOUCH VERIO) TEST STRIP    Use to test blood sugar 2-3 times daily. Dx: E11.9  GLUCOSE BLOOD TEST STRIP    One Touch Ultra 2 Test Strips Use to test blood sugar once daily. Dx: E11.9   HYDROCHLOROTHIAZIDE (HYDRODIURIL) 25 MG TABLET    TAKE 1 TABLET(25 MG) BY MOUTH DAILY   IBUPROFEN (ADVIL) 800 MG TABLET    Take 1 tablet (800 mg total) by mouth every 8 (eight) hours as needed.   LANCETS (FREESTYLE) LANCETS    Use to check blood sugar once daily. Dx: E11.9   LEVOTHYROXINE (SYNTHROID) 25 MCG TABLET    TAKE 1 TABLET(25 MCG) BY MOUTH DAILY BEFORE BREAKFAST   METFORMIN (GLUCOPHAGE) 1000 MG TABLET    Take 1 tablet (1,000 mg total) by mouth 2 (two) times daily with a meal.   METOPROLOL SUCCINATE (TOPROL-XL) 50 MG 24 HR TABLET    Take 1 tablet (50 mg total) by mouth daily. Take with or immediately following a meal.   OMEPRAZOLE (PRILOSEC) 40 MG CAPSULE    Take 40 mg by mouth 2 (two) times daily.   PENICILLIN V POTASSIUM (VEETID) 500 MG TABLET    Take 500 mg by mouth 4 (four) times daily.   POTASSIUM CHLORIDE (KLOR-CON) 10 MEQ TABLET    Take 1 tablet (10 mEq total) by mouth 2 (two) times daily.   ROSUVASTATIN (CRESTOR) 40 MG TABLET    Take 1 tablet (40 mg total) by mouth daily.   SITAGLIPTIN (JANUVIA) 50 MG TABLET    Take 1 tablet (50 mg total) by mouth daily.   SODIUM CHLORIDE 0.9 % INFUSION    Inject into the vein once. 10 ml/hr   SUCRALFATE (CARAFATE) 1 GM/10ML SUSPENSION    SHAKE LIQUID AND TAKE 10 ML(1 GRAM) BY MOUTH FOUR TIMES DAILY  Modified Medications   No medications on file  Discontinued Medications   ACETAMINOPHEN (TYLENOL) 500 MG TABLET    Take 2 tablets (1,000 mg total) by mouth in the morning and at  bedtime. And 500 mg tablet daily at 2 pm   HYDROCODONE-ACETAMINOPHEN (NORCO/VICODIN) 5-325 MG TABLET    Take 1 tablet by mouth every 4 (four) hours as needed for moderate pain.   IBUPROFEN (ADVIL) 800 MG TABLET    Take 1 tablet (800 mg total) by mouth every 8 (eight) hours as needed.   RISPERIDONE (RISPERDAL) 2 MG TABLET    Take 2 mg by mouth at bedtime.    Physical Exam: unable to assess  Vitals:   11/19/20 1423  BP: 122/74  Pulse: 83  Temp: (!) 96.6 F (35.9 C)  TempSrc: Temporal  SpO2: 96%  Weight: 156 lb (70.8 kg)  Height: '5\' 5"'  (1.651 m)   Body mass index is 25.96 kg/m. Wt Readings from Last 3 Encounters:  11/19/20 156 lb (70.8 kg)  10/05/20 152 lb (68.9 kg)  10/01/20 152 lb 6.4 oz (69.1 kg)    Physical Exam unable to perform  Labs reviewed: Basic Metabolic Panel: Recent Labs    05/26/20 1553 09/02/20 1257 09/04/20 1245 09/21/20 1917 10/01/20 1147  NA 135   < > 132* 135 138  K 3.7   < > 3.0* 2.8* 3.7  CL 96*   < > 97* 101 99  CO2 34*   < > '26 25 28  ' GLUCOSE 141*   < > 118* 144* 120*  BUN 23   < > '18 20 20  ' CREATININE 1.20*   < > 1.27* 1.58* 1.18*  CALCIUM 9.5   < > 9.5 9.1 9.5  MG  --   --  1.7  --   --  TSH 1.51  --   --   --  1.53   < > = values in this interval not displayed.   Liver Function Tests: Recent Labs    04/03/20 1821 04/28/20 1509 09/02/20 1257 09/21/20 1917 10/01/20 1147  AST 21   < > '19 15 15  ' ALT 16   < > '18 16 12  ' ALKPHOS 76  --  67 82  --   BILITOT 0.9   < > 0.6 0.4 0.3  PROT 7.1   < > 7.2 7.1 7.2  ALBUMIN 3.3*  --  3.2* 3.1*  --    < > = values in this interval not displayed.   No results for input(s): LIPASE, AMYLASE in the last 8760 hours. No results for input(s): AMMONIA in the last 8760 hours. CBC: Recent Labs    04/28/20 1509 05/26/20 1553 09/02/20 1257 09/03/20 0833 09/21/20 1917 10/01/20 1147  WBC 6.7 6.7 6.0  --  5.0 6.2  NEUTROABS 3,176 2,566  --   --   --  3,032  HGB 10.7* 11.4* 11.8* 11.9* 10.5* 11.4*   HCT 30.7* 34.1* 35.0* 35.0* 31.0* 34.7*  MCV 95.3 97.7 94.1  --  93.4 95.9  PLT 227 228 197  --  229 242   Lipid Panel: Recent Labs    03/12/20 1529 05/26/20 1553 10/01/20 1147  CHOL 148 178 259*  HDL 48 60 48*  LDLCALC 73 98 180*  TRIG 161* 107 163*  CHOLHDL 3.1 3.0 5.4*   TSH: Recent Labs    05/26/20 1553 10/01/20 1147  TSH 1.51 1.53   A1C: Lab Results  Component Value Date   HGBA1C 8.6 (H) 10/01/2020     Assessment/Plan There are no diagnoses linked to this encounter.  Next appt: N/A Fern Asmar [place], [facility] [phone]

## 2020-11-20 ENCOUNTER — Other Ambulatory Visit: Payer: Self-pay | Admitting: Orthopedic Surgery

## 2020-11-20 ENCOUNTER — Other Ambulatory Visit: Payer: Self-pay

## 2020-11-23 ENCOUNTER — Other Ambulatory Visit: Payer: Self-pay | Admitting: Physician Assistant

## 2020-11-23 ENCOUNTER — Telehealth: Payer: Self-pay | Admitting: Orthopedic Surgery

## 2020-11-23 ENCOUNTER — Other Ambulatory Visit: Payer: Self-pay | Admitting: Orthopedic Surgery
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
## 2020-11-23 NOTE — Telephone Encounter (Signed)
Patient called she is returning a voice mail that was left she stated she called her primary care doctor and they  told her that Dr.Duda has to be the one that prescribes her hydrocodone since she is continuing to see him and has been prescribing the rx in the past call back:[phone]

## 2020-11-23 NOTE — Progress Notes (Signed)
   This service is provided via telemedicine ° °No vital signs collected/recorded due to the encounter was a telemedicine visit.  ° °Location of patient (ex: home, work): Home. ° °Patient consents to a telephone visit: Yes. ° °Location of the provider (ex: office, home): [facility]. ° °Name of any referring provider: Ngetich, [name] C, NP  ° °Names of all persons participating in the telemedicine service and their role in the encounter: Patient, Avalee Castrellon, RMA, Ngetich, [name], NP.   ° °Time spent on call: 8 minutes spent on the phone with Medical Assistant.   °
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
## 2020-11-24 ENCOUNTER — Ambulatory Visit: Payer: Medicare (Managed Care) | Admitting: Family

## 2020-11-24 ENCOUNTER — Telehealth: Payer: Self-pay | Admitting: *Deleted

## 2020-11-24 NOTE — Telephone Encounter (Signed)
Received paperwork from [employer].  BNC#:[id number] Stating that patient is applying for direct payment o fher Social Security Benefits and in order to make a decision of patient's request, a capability determination must be made by a physician.   Placed paperwork in Amy Fargo's folder to review. ([name] out of office).

## 2020-11-26 ENCOUNTER — Other Ambulatory Visit: Payer: Self-pay | Admitting: Family

## 2020-11-26 DIAGNOSIS — N952 Postmenopausal atrophic vaginitis: Secondary | ICD-10-CM

## 2020-11-27 ENCOUNTER — Other Ambulatory Visit: Payer: Self-pay | Admitting: Orthopedic Surgery

## 2020-12-01 ENCOUNTER — Telehealth: Payer: Self-pay | Admitting: Orthopedic Surgery

## 2020-12-01 ENCOUNTER — Ambulatory Visit: Payer: Medicare Other | Admitting: Family

## 2020-12-01 NOTE — Telephone Encounter (Signed)
Pt called wanting to know when her appt time was for 12/01/20 but she was not with Dr. Sharol Given specifically, she cancelled the appt but is still needing prescription refill on 800 mg Ibuprofen Advil for pain in left side, has made an appt to see Dr. Sharol Given next week. The pharmacy on file is still the best one and the best call back number is [phone] if needed.

## 2020-12-01 NOTE — Telephone Encounter (Signed)
Front desk advised pt that she will have to discuss medication at her appt.

## 2020-12-01 NOTE — Telephone Encounter (Signed)
Patient called she stated she is having pain in her left hip and that she cant put her foot on the ground, patient wanted to know is she can be worked into Dr.Duda's schedule this week I advised patient per Autumn that Dr.Duda is not able to do any work ins this week. I advised the patient she can see Junie Panning tomorrow afternoon patient stated she needs to see Dr.Duda specifically, patient asked me to see if Dr.Duda can refill her rx for ibuprofen which is the only thing that seems to help her I advised the patient that she has to be seen in order for rx to be refilled patient began to cry she told me to tell Dr.Duda that she is never coming back and that she cant even put her clothes on then she hung up.

## 2020-12-01 NOTE — Telephone Encounter (Deleted)
800mg  advil

## 2020-12-07 ENCOUNTER — Ambulatory Visit: Payer: Medicare Other | Admitting: Orthopedic Surgery

## 2020-12-10 ENCOUNTER — Other Ambulatory Visit: Payer: Self-pay

## 2020-12-10 ENCOUNTER — Other Ambulatory Visit: Payer: Self-pay | Admitting: Orthopedic Surgery

## 2020-12-10 DIAGNOSIS — N183 Chronic kidney disease, stage 3 unspecified: Secondary | ICD-10-CM

## 2020-12-10 DIAGNOSIS — K219 Gastro-esophageal reflux disease without esophagitis: Secondary | ICD-10-CM

## 2020-12-10 DIAGNOSIS — I10 Essential (primary) hypertension: Secondary | ICD-10-CM

## 2020-12-10 DIAGNOSIS — I129 Hypertensive chronic kidney disease with stage 1 through stage 4 chronic kidney disease, or unspecified chronic kidney disease: Secondary | ICD-10-CM

## 2020-12-10 MED ORDER — METOPROLOL SUCCINATE ER 50 MG PO TB24: 50.0000 mg | ORAL_TABLET | Freq: Every day | ORAL | 3 refills | Status: DC

## 2020-12-10 MED ORDER — PENICILLIN V POTASSIUM 500 MG PO TABS: 500.0000 mg | ORAL_TABLET | Freq: Four times a day (QID) | ORAL | 0 refills | Status: DC

## 2020-12-10 MED ORDER — SUCRALFATE 1 GM/10ML PO SUSP: ORAL | 0 refills | Status: DC

## 2020-12-10 MED ORDER — LEVOTHYROXINE SODIUM 25 MCG PO TABS: ORAL_TABLET | ORAL | 3 refills | Status: DC

## 2020-12-10 MED ORDER — CYCLOBENZAPRINE HCL 10 MG PO TABS: 5.0000 mg | ORAL_TABLET | Freq: Three times a day (TID) | ORAL | 0 refills | Status: DC | PRN

## 2020-12-10 MED ORDER — DAPAGLIFLOZIN PROPANEDIOL 5 MG PO TABS: 5.0000 mg | ORAL_TABLET | Freq: Every day | ORAL | 1 refills | Status: DC

## 2020-12-10 MED ORDER — SITAGLIPTIN PHOSPHATE 50 MG PO TABS: 50.0000 mg | ORAL_TABLET | Freq: Every day | ORAL | 1 refills | Status: DC

## 2020-12-10 MED ORDER — OMEPRAZOLE 40 MG PO CPDR: 40.0000 mg | DELAYED_RELEASE_CAPSULE | Freq: Two times a day (BID) | ORAL | 1 refills | Status: DC

## 2020-12-10 MED ORDER — ROSUVASTATIN CALCIUM 40 MG PO TABS: 40.0000 mg | ORAL_TABLET | Freq: Every day | ORAL | 3 refills | Status: DC

## 2020-12-10 MED ORDER — METFORMIN HCL 1000 MG PO TABS: 1000.0000 mg | ORAL_TABLET | Freq: Two times a day (BID) | ORAL | 1 refills | Status: DC

## 2020-12-10 MED ORDER — HYDROCHLOROTHIAZIDE 25 MG PO TABS: ORAL_TABLET | ORAL | 3 refills | Status: DC

## 2020-12-10 MED ORDER — AMLODIPINE BESYLATE 5 MG PO TABS: 5.0000 mg | ORAL_TABLET | Freq: Every day | ORAL | 1 refills | Status: DC

## 2020-12-10 MED ORDER — IBUPROFEN 800 MG PO TABS: 800.0000 mg | ORAL_TABLET | Freq: Three times a day (TID) | ORAL | 1 refills | Status: DC | PRN
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
## 2020-12-10 NOTE — Telephone Encounter (Signed)
I am sorry to hear this. I will refill medications. I would also advise her to ask pharmacy if there is a "code word" she can set up with them to verify identity.

## 2020-12-11 ENCOUNTER — Telehealth: Payer: Self-pay | Admitting: Family

## 2020-12-11 NOTE — Telephone Encounter (Signed)
LMOM to return call.

## 2020-12-11 NOTE — Telephone Encounter (Signed)
Discussed with the pharmacy.

## 2020-12-11 NOTE — Telephone Encounter (Signed)
Ok to stop dayquil. Recommend mucinex (purchase over the counter) if she continues to cough up sputum, follow directions on the box.

## 2020-12-11 NOTE — Telephone Encounter (Signed)
Ms [name] Long is calling to "Thank" everyone that helped get her 14 meds together while she's been sick with  bronchial pneumonia.  She's needing someone to call & advise on when to stop taking Dayquil. Sereniti said she feels that it's not helping & she's been taking it over 7 days now?  Please advise Lattie Haw

## 2020-12-14 NOTE — Telephone Encounter (Signed)
LMOM to return call.

## 2020-12-15 ENCOUNTER — Telehealth: Payer: Self-pay | Admitting: Orthopedic Surgery

## 2020-12-15 NOTE — Telephone Encounter (Signed)
Patient called. She would like to know if she can get cortisone shot in her shoulder and knee on 6/20

## 2020-12-16 NOTE — Telephone Encounter (Signed)
Tried to reach pt no answer. Can discuss at appt on 12/22/20

## 2020-12-18 ENCOUNTER — Ambulatory Visit: Payer: Medicare Other | Admitting: Family

## 2020-12-21 ENCOUNTER — Ambulatory Visit (INDEPENDENT_AMBULATORY_CARE_PROVIDER_SITE_OTHER): Payer: Medicare Other | Admitting: Orthopedic Surgery

## 2020-12-21 ENCOUNTER — Encounter: Payer: Self-pay | Admitting: Orthopedic Surgery

## 2020-12-21 DIAGNOSIS — G8929 Other chronic pain: Secondary | ICD-10-CM

## 2020-12-21 DIAGNOSIS — M25512 Pain in left shoulder: Secondary | ICD-10-CM

## 2020-12-21 DIAGNOSIS — G894 Chronic pain syndrome: Secondary | ICD-10-CM | POA: Diagnosis not present

## 2020-12-21 DIAGNOSIS — M545 Low back pain, unspecified: Secondary | ICD-10-CM | POA: Diagnosis not present

## 2020-12-21 DIAGNOSIS — M542 Cervicalgia: Secondary | ICD-10-CM | POA: Diagnosis not present

## 2020-12-21 DIAGNOSIS — M25562 Pain in left knee: Secondary | ICD-10-CM | POA: Diagnosis not present

## 2020-12-21 MED ORDER — CELECOXIB 200 MG PO CAPS: 200.0000 mg | ORAL_CAPSULE | Freq: Two times a day (BID) | ORAL | 3 refills | Status: DC
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
## 2020-12-24 ENCOUNTER — Other Ambulatory Visit: Payer: Self-pay | Admitting: *Deleted

## 2020-12-24 ENCOUNTER — Telehealth: Payer: Self-pay | Admitting: *Deleted

## 2020-12-24 NOTE — Telephone Encounter (Signed)
-   restart her Estrogen 0.1 mg /GM vaginal cream one applicatorful vaginally at bedtime.

## 2020-12-24 NOTE — Telephone Encounter (Signed)
Patient called and stated that she needs a refill on her Estrogen Cream and a Vaginal Itch cream. Patient stated that you have prescribed this for her before. Stated that she is itching after taking antibiotics.   Please Advise.

## 2020-12-25 MED ORDER — ESTROGENS, CONJUGATED 0.625 MG/GM VA CREA: 1.0000 | TOPICAL_CREAM | Freq: Every day | VAGINAL | 3 refills | Status: DC

## 2020-12-25 NOTE — Telephone Encounter (Signed)
I am not getting Estrogen 0.1mg  to pull up in my medication list to choose.   And are you also going to prescribe something for her itching in the Vaginal area?  Please Advise.

## 2020-12-25 NOTE — Telephone Encounter (Signed)
Patient aware.

## 2020-12-25 NOTE — Telephone Encounter (Signed)
Estrogen 4.098 /gm one applicator daily at bedtime .try using cream might help with itchiness due to vaginal dryness.

## 2020-12-29 ENCOUNTER — Encounter: Payer: Self-pay | Admitting: Orthopedic Surgery

## 2020-12-29 ENCOUNTER — Other Ambulatory Visit: Payer: Self-pay | Admitting: Orthopedic Surgery

## 2020-12-29 ENCOUNTER — Telehealth: Payer: Self-pay | Admitting: Family

## 2020-12-29 ENCOUNTER — Telehealth: Payer: Self-pay | Admitting: Orthopedic Surgery

## 2020-12-29 NOTE — Telephone Encounter (Signed)
Daughter is POA and doesn't want her mom to have ibuprofen

## 2020-12-29 NOTE — Telephone Encounter (Signed)
Please discuss with Dr. Sharol Given

## 2020-12-29 NOTE — Progress Notes (Unsigned)
I called patient's daughter as per her daughter's request to state that the patient requested  narcotic pain medication.  Discussed that we could try gabapentin, Neurontin a nerve medicine.  Patient's daughter states that it would be good to try.  I called the patient back but she did not answer the phone I will call in the prescription when patient is available to discuss the treatment plan.

## 2020-12-29 NOTE — Telephone Encounter (Signed)
Social security administration supplemental security income paper work received to be filled will need visit to complete paper work.Will also need an MMSE done.

## 2020-12-29 NOTE — Telephone Encounter (Signed)
Pt called stating her pain rx isn't working and the pain is so great she can't sleep. Pt would like to have the hydrocodone called in and her primary Dr told her Dr. Sharol Given has to prescribe it because he's her ortho Dr. Abbott Pao asked that Dr. Sharol Given call her and no one else.   [phone]

## 2020-12-30 ENCOUNTER — Other Ambulatory Visit: Payer: Self-pay | Admitting: Orthopedic Surgery

## 2020-12-30 ENCOUNTER — Telehealth: Payer: Self-pay | Admitting: Orthopedic Surgery

## 2020-12-30 ENCOUNTER — Other Ambulatory Visit: Payer: Self-pay | Admitting: Family

## 2020-12-30 DIAGNOSIS — N952 Postmenopausal atrophic vaginitis: Secondary | ICD-10-CM

## 2020-12-30 MED ORDER — IBUPROFEN 800 MG PO TABS: 800.0000 mg | ORAL_TABLET | Freq: Three times a day (TID) | ORAL | 0 refills | Status: DC | PRN

## 2020-12-30 NOTE — Telephone Encounter (Signed)
Spoke with daughter, she stated she has started a new job and will see what her schedule looks like for next week and will call back to schedule an appointment to bring patient into office.

## 2020-12-30 NOTE — Telephone Encounter (Signed)
Pt called and is asking about her medication. I let her know Sharol Given is in surgery and will be giving her a call when he gets a chance.   CB [phone]

## 2020-12-30 NOTE — Telephone Encounter (Signed)
Please see below.

## 2021-01-01 NOTE — Telephone Encounter (Signed)
Noted  

## 2021-01-06 ENCOUNTER — Telehealth: Payer: Self-pay

## 2021-01-06 NOTE — Telephone Encounter (Signed)
FYI patient called she stated she will come pick up her cast either Friday or Monday call back:[phone]

## 2021-01-07 NOTE — Telephone Encounter (Signed)
Dr. Sharol Given advised pt that she could have a knee sleeve. Will hold this message and await pt to come in for fitting.

## 2021-01-14 ENCOUNTER — Ambulatory Visit: Payer: Medicare Other

## 2021-01-14 ENCOUNTER — Other Ambulatory Visit: Payer: Self-pay | Admitting: Physician Assistant

## 2021-01-14 ENCOUNTER — Telehealth: Payer: Self-pay | Admitting: Orthopedic Surgery

## 2021-01-14 ENCOUNTER — Telehealth: Payer: Self-pay | Admitting: Radiology

## 2021-01-14 NOTE — Telephone Encounter (Signed)
Pt called about her 800 mg ibuprofen, she said she is at the pharmacy and its not there.

## 2021-01-15 NOTE — Telephone Encounter (Signed)
Holding for you. Tried calling to advise. No answer.

## 2021-01-15 NOTE — Telephone Encounter (Signed)
error 

## 2021-01-15 NOTE — Telephone Encounter (Signed)
Patient was in the office yesterday and is aware of [facility] message

## 2021-01-15 NOTE — Telephone Encounter (Signed)
No She will have to waituntil Dr. Sharol Given returns. Her daughter who is her legal guardian has been specific not to give her ibuprofen. Dr. Sharol Given did give her some but he will need to decide to refill. ( She is allergic)

## 2021-01-21 ENCOUNTER — Ambulatory Visit (INDEPENDENT_AMBULATORY_CARE_PROVIDER_SITE_OTHER): Payer: Medicare Other | Admitting: Orthopedic Surgery

## 2021-01-21 ENCOUNTER — Ambulatory Visit (INDEPENDENT_AMBULATORY_CARE_PROVIDER_SITE_OTHER): Payer: Medicare Other

## 2021-01-21 DIAGNOSIS — M25512 Pain in left shoulder: Secondary | ICD-10-CM

## 2021-01-21 DIAGNOSIS — M170 Bilateral primary osteoarthritis of knee: Secondary | ICD-10-CM

## 2021-01-21 DIAGNOSIS — M542 Cervicalgia: Secondary | ICD-10-CM | POA: Diagnosis not present

## 2021-01-21 DIAGNOSIS — G8929 Other chronic pain: Secondary | ICD-10-CM | POA: Diagnosis not present

## 2021-01-21 DIAGNOSIS — G894 Chronic pain syndrome: Secondary | ICD-10-CM

## 2021-01-21 MED ORDER — IBUPROFEN 800 MG PO TABS: 800.0000 mg | ORAL_TABLET | Freq: Three times a day (TID) | ORAL | 0 refills | Status: DC | PRN

## 2021-01-22 ENCOUNTER — Other Ambulatory Visit: Payer: Self-pay

## 2021-01-22 ENCOUNTER — Telehealth: Payer: Self-pay | Admitting: Orthopedic Surgery

## 2021-01-22 ENCOUNTER — Ambulatory Visit: Payer: Medicare Other | Admitting: Family

## 2021-01-22 ENCOUNTER — Encounter: Payer: Self-pay | Admitting: Family

## 2021-01-22 ENCOUNTER — Ambulatory Visit (INDEPENDENT_AMBULATORY_CARE_PROVIDER_SITE_OTHER): Payer: Medicare Other | Admitting: Family

## 2021-01-22 VITALS — BP 142/80 | HR 78 | Temp 97.5°F | Resp 16 | Ht 65.0 in | Wt 151.8 lb

## 2021-01-22 DIAGNOSIS — N939 Abnormal uterine and vaginal bleeding, unspecified: Secondary | ICD-10-CM

## 2021-01-22 DIAGNOSIS — K219 Gastro-esophageal reflux disease without esophagitis: Secondary | ICD-10-CM

## 2021-01-22 DIAGNOSIS — J449 Chronic obstructive pulmonary disease, unspecified: Secondary | ICD-10-CM

## 2021-01-22 DIAGNOSIS — R059 Cough, unspecified: Secondary | ICD-10-CM | POA: Diagnosis not present

## 2021-01-22 DIAGNOSIS — N898 Other specified noninflammatory disorders of vagina: Secondary | ICD-10-CM | POA: Diagnosis not present

## 2021-01-22 LAB — CBC WITH DIFFERENTIAL/PLATELET
Absolute Monocytes: 1056 cells/uL — ABNORMAL HIGH (ref 200–950)
Basophils Absolute: 9 cells/uL (ref 0–200)
Basophils Relative: 0.1 %
Eosinophils Absolute: 0 cells/uL — ABNORMAL LOW (ref 15–500)
Eosinophils Relative: 0 %
HCT: 36.8 % (ref 35.0–45.0)
Hemoglobin: 12.2 g/dL (ref 11.7–15.5)
Lymphs Abs: 1674 cells/uL (ref 850–3900)
MCH: 30.7 pg (ref 27.0–33.0)
MCHC: 33.2 g/dL (ref 32.0–36.0)
MCV: 92.5 fL (ref 80.0–100.0)
MPV: 10.8 fL (ref 7.5–12.5)
Monocytes Relative: 11.6 %
Neutro Abs: 6361 cells/uL (ref 1500–7800)
Neutrophils Relative %: 69.9 %
Platelets: 263 10*3/uL (ref 140–400)
RBC: 3.98 10*6/uL (ref 3.80–5.10)
RDW: 14.5 % (ref 11.0–15.0)
Total Lymphocyte: 18.4 %
WBC: 9.1 10*3/uL (ref 3.8–10.8)

## 2021-01-22 LAB — BASIC METABOLIC PANEL WITH GFR
BUN/Creatinine Ratio: 17 (calc) (ref 6–22)
BUN: 41 mg/dL — ABNORMAL HIGH (ref 7–25)
CO2: 25 mmol/L (ref 20–32)
Calcium: 9.9 mg/dL (ref 8.6–10.4)
Chloride: 95 mmol/L — ABNORMAL LOW (ref 98–110)
Creat: 2.39 mg/dL — ABNORMAL HIGH (ref 0.50–1.05)
Glucose, Bld: 261 mg/dL — ABNORMAL HIGH (ref 65–139)
Potassium: 3 mmol/L — ABNORMAL LOW (ref 3.5–5.3)
Sodium: 133 mmol/L — ABNORMAL LOW (ref 135–146)
eGFR: 21 mL/min/{1.73_m2} — ABNORMAL LOW (ref >=60)

## 2021-01-22 MED ORDER — ESTROGENS, CONJUGATED 0.625 MG/GM VA CREA: 1.0000 | TOPICAL_CREAM | Freq: Every day | VAGINAL | 3 refills | Status: DC

## 2021-01-22 MED ORDER — SUCRALFATE 1 GM/10ML PO SUSP: ORAL | 0 refills | Status: DC

## 2021-01-22 MED ORDER — POTASSIUM CHLORIDE CRYS ER 10 MEQ PO TBCR: 10.0000 meq | EXTENDED_RELEASE_TABLET | Freq: Two times a day (BID) | ORAL | 3 refills | Status: DC

## 2021-01-22 MED ORDER — PENICILLIN V POTASSIUM 500 MG PO TABS: 500.0000 mg | ORAL_TABLET | Freq: Four times a day (QID) | ORAL | 0 refills | Status: DC

## 2021-01-22 NOTE — Patient Instructions (Signed)
Please get X-ray at [place] [facility] then will call you with results.
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
## 2021-01-22 NOTE — Telephone Encounter (Signed)
Please call Crystal D. For this message

## 2021-01-26 ENCOUNTER — Telehealth: Payer: Self-pay | Admitting: Orthopedic Surgery
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
## 2021-01-26 NOTE — Telephone Encounter (Signed)
Dr. Sharol Given is aware but also sending to you to make you aware. The pt is pleasant when she comes into the office there is never any upset feelings  or concerns expressed during her visits. It is after the patient has been seen in office that she calls with multiple complaints all of which are things that have not taken place. I am not really sure how to move forward with this other than to continue to document.

## 2021-01-29 ENCOUNTER — Other Ambulatory Visit: Payer: Self-pay

## 2021-01-29 DIAGNOSIS — E876 Hypokalemia: Secondary | ICD-10-CM

## 2021-01-29 MED ORDER — POTASSIUM CHLORIDE CRYS ER 20 MEQ PO TBCR: 40.0000 meq | EXTENDED_RELEASE_TABLET | Freq: Once | ORAL | 0 refills | Status: DC

## 2021-01-31 ENCOUNTER — Encounter: Payer: Self-pay | Admitting: Orthopedic Surgery
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
## 2021-02-05 ENCOUNTER — Telehealth: Payer: Self-pay | Admitting: Orthopedic Surgery

## 2021-02-05 NOTE — Telephone Encounter (Signed)
Pt would like to schedule a gel injection.   CB [phone]

## 2021-02-05 NOTE — Telephone Encounter (Signed)
Bilateral knee monovisc gel injections for bilateral knee OA last injections 08/05/20

## 2021-02-08 ENCOUNTER — Other Ambulatory Visit: Payer: Self-pay

## 2021-02-08 ENCOUNTER — Telehealth: Payer: Self-pay

## 2021-02-08 ENCOUNTER — Telehealth: Payer: Self-pay | Admitting: Orthopedic Surgery

## 2021-02-08 DIAGNOSIS — E876 Hypokalemia: Secondary | ICD-10-CM

## 2021-02-08 NOTE — Telephone Encounter (Signed)
Pt called wanting to make sure we will call her to let her know if she got approved or denied for the gel injections. Pt also wanted to know if there would be any paperwork she needs to fill out?  [phone]

## 2021-02-08 NOTE — Telephone Encounter (Signed)
I left a detailed message on voicemail for patients daughter informing her that her mother missed her 1:45 pm lab appointment today and to tell her that her mother needs to schedule an appointment to address any concerns that she has for Marlowe Sax, NP

## 2021-02-08 NOTE — Telephone Encounter (Signed)
Patient called the office stating she had lab work a few weeks ago and no one has contacted her about the results.   I informed patient that she and I spoke on 01/29/2021 and I attempted to tell her about labs, patient called me a liar and stated she has never spoken to me before or heard of me. Patient states I remind her of Marlowe Kays ( I do not know who Marlowe Kays is) and Marlowe Kays doesn't know what she is talking about half the time. I informed patient of [name]'s initial response to her labs dated 01/22/2021. I also informed patient that she has an appointment today to have her potassium rechecked. Patient asked who scheduled the appointment, I told her that her daughter (legal guardian), Cassandra scheduled the appointment.   Patient then stated I have some nerve scheduling an appointment with her daughter and what we can do is take her daughters blood since we had the nerve to call her about lab result,  as she would not be coming the lab appointment today. Patient stated we must of mixed her blood up with someone else as she has the best blood in [place], patient proceeded by saying my potassium was perfectly normal the last time and she had not made any changes to her medications. Patient ended the call by saying " Now you have a good day," and she hung up.   I spoke with Lattie Haw (administrative team member) and she stated that lab appointment was confirmed on Friday with  Cassandra (patients daughter)

## 2021-02-08 NOTE — Telephone Encounter (Addendum)
Patient called stating she needs doctor [name] to call her as soon as she can, because some girl at this office is confused (patient talked as if she did not realize that I am the girl she is referencing) about Cassandra having guardianship over her. Patient states Cassandra had guardianship x 4 years but that has been changed.   Patient states Orpah [place], [place] are all trying to steal her identity as they are jealous about some money that she will be receiving for being the beneficiary of her father. Patient mentioned there is a lot of people out her trying to steal her medical records and acting like peeping tom.  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 and he needs to stop using her as a model patient, using a machine that forces her to have bowel movements. Patient states she has reported all this to the police but they do not believe her as Orpah Clinton has made them think she is crazy. Patient stated she really needs to talk to [facility] about all this.

## 2021-02-08 NOTE — Telephone Encounter (Signed)
I recommend schedule in office visit some times this week to address concerns.

## 2021-02-09 ENCOUNTER — Telehealth: Payer: Self-pay

## 2021-02-09 NOTE — Telephone Encounter (Signed)
See previous message in chart.  

## 2021-02-09 NOTE — Telephone Encounter (Signed)
Talked with patient's guardian and advised her that gel injection has been submitted and that she will receive a call to schedule once approved.  Guardian voiced that she understands.

## 2021-02-09 NOTE — Telephone Encounter (Signed)
VOB submitted for Monovisc, bilateral knee. Pending BV. 

## 2021-02-10 ENCOUNTER — Telehealth: Payer: Self-pay

## 2021-02-10 ENCOUNTER — Ambulatory Visit: Payer: Medicare Other | Admitting: Obstetrics and Gynecology

## 2021-02-10 DIAGNOSIS — Z0289 Encounter for other administrative examinations: Secondary | ICD-10-CM

## 2021-02-10 NOTE — Telephone Encounter (Signed)
Approved for Monovisc, bilateral knee. [place] deductible has been met Patient will be responsible for 20% OOP. No Co-pay No PA required  Appt. 02/15/2021 with Dr. Sharol Given.

## 2021-02-11 ENCOUNTER — Telehealth: Payer: Self-pay | Admitting: Family

## 2021-02-11 DIAGNOSIS — Z1231 Encounter for screening mammogram for malignant neoplasm of breast: Secondary | ICD-10-CM

## 2021-02-11 DIAGNOSIS — Z780 Asymptomatic menopausal state: Secondary | ICD-10-CM

## 2021-02-11 NOTE — Telephone Encounter (Signed)
She said her bone dens paperwork is expired so she wanted to know if we could call GI at [facility] she needs [place] to fax it to [phone], she needs you to fax a form for her to have a mammogram and bone density test done.   Call her back to let her know a schedule  [phone]

## 2021-02-11 NOTE — Telephone Encounter (Signed)
Mammogram and Bone density is due in [place] will order then [place] will call you.

## 2021-02-11 NOTE — Addendum Note (Signed)
Addended byMarlowe Sax C on: 02/11/2021 05:08 PM   Modules accepted: Orders

## 2021-02-15 ENCOUNTER — Ambulatory Visit: Payer: Medicare Other | Admitting: Orthopedic Surgery

## 2021-02-16 ENCOUNTER — Other Ambulatory Visit: Payer: Self-pay

## 2021-02-16 ENCOUNTER — Ambulatory Visit (INDEPENDENT_AMBULATORY_CARE_PROVIDER_SITE_OTHER): Payer: Medicare Other | Admitting: Orthopedic Surgery

## 2021-02-16 DIAGNOSIS — M170 Bilateral primary osteoarthritis of knee: Secondary | ICD-10-CM

## 2021-02-16 MED ORDER — IBUPROFEN 800 MG PO TABS: 800.0000 mg | ORAL_TABLET | Freq: Three times a day (TID) | ORAL | 0 refills | Status: DC | PRN

## 2021-02-18 ENCOUNTER — Telehealth: Payer: Self-pay | Admitting: Orthopedic Surgery

## 2021-02-18 NOTE — Telephone Encounter (Signed)
Pt called and asked for a right knee brace same as left knee brace. Please call patient about this matter. Pt phone number [phone]. She always states she still think she need a brace for right arm.

## 2021-02-19 NOTE — Telephone Encounter (Signed)
Called pt to advise we can have her come in for brace. She states she can not come till next Friday and so this was scheduled for 2 pm nurse visit

## 2021-02-22 ENCOUNTER — Telehealth: Payer: Self-pay | Admitting: Family

## 2021-02-23 NOTE — Telephone Encounter (Signed)
Error- duplicate meesage

## 2021-02-24 ENCOUNTER — Telehealth: Payer: Self-pay | Admitting: Family

## 2021-02-24 ENCOUNTER — Encounter: Payer: Self-pay | Admitting: Orthopedic Surgery

## 2021-02-24 DIAGNOSIS — M170 Bilateral primary osteoarthritis of knee: Secondary | ICD-10-CM | POA: Diagnosis not present

## 2021-02-24 MED ORDER — LIDOCAINE HCL 1 % IJ SOLN
1.0000 mL | INTRAMUSCULAR | Status: AC | PRN
  Administered 2021-02-24: 1 mL

## 2021-02-24 MED ORDER — HYALURONAN 88 MG/4ML IX SOSY
88.0000 mg | PREFILLED_SYRINGE | INTRA_ARTICULAR | Status: AC | PRN
  Administered 2021-02-24: 88 mg via INTRA_ARTICULAR

## 2021-02-24 NOTE — Telephone Encounter (Signed)
[name] Long called and was checking to make sure you called GI about her diagnostic mammogram as long with the Bone Dens test being done at the same time and she needs a refill on her stomach medicine " sulfate solution " Succca-soulution that she takes 4 times a day. And also the green greyish capsuls that she takes 4 times a day "omerstaral " she said something like that she said for her stomach.

## 2021-02-24 NOTE — Telephone Encounter (Signed)
Both your orders for Mammogram and Bone density are in place.Imaging center was supposed to call you to make appointment.  Please spell the medication name from the bottle so that we can order the right medication for you. Did you mean Omeprazole for acid and Sucralfate ?

## 2021-02-24 NOTE — Progress Notes (Signed)
Office Visit Note   Patient: [name] Long           Date of Birth: 1951/01/03           MRN: [id number] Visit Date: 02/16/2021              Requested by: Sandrea Hughs, NP [address] [place],   [address] PCP: Sandrea Hughs, NP  Chief Complaint  Patient presents with   Right Knee - Follow-up    Monovisc buy and bill bilateral knee injections    Left Knee - Follow-up      HPI: Patient is a 70 year old woman who has had osteoarthritis bilateral knees she has had previous relief with steroid and hyaluronic acid injections and presents for repeat evaluation of both knees.  Patient also complains of chronic shoulder pain which has also had good relief from injections.  Assessment & Plan: Visit Diagnoses:  1. Bilateral primary osteoarthritis of knee     Plan: Both knees were injected with Monovisc.  Patient will follow-up as needed for evaluation of her shoulders.  Follow-Up Instructions: Return if symptoms worsen or fail to improve.   Ortho Exam  Patient is alert, oriented, no adenopathy, well-dressed, normal affect, normal respiratory effort. Examination patient has crepitation with range of motion of both knees there is no effusion no redness no cellulitis.  After informed consent both knees were injected with Monovisc.  Imaging: No results found. No images are attached to the encounter.  Labs: Lab Results  Component Value Date   HGBA1C 8.6 (H) 10/01/2020   HGBA1C 8.1 (H) 05/26/2020   HGBA1C 10.3 (A) 03/23/2020   LABORGA NO GROWTH 12/04/2014     Lab Results  Component Value Date   ALBUMIN 3.1 (L) 09/21/2020   ALBUMIN 3.2 (L) 09/02/2020   ALBUMIN 3.3 (L) 04/03/2020    Lab Results  Component Value Date   MG 1.7 09/04/2020   MG 1.7 12/25/2016   MG 1.8 06/10/2016   Lab Results  Component Value Date   VD25OH 43.0 02/23/2018   VD25OH 30 05/06/2015   VD25OH 12 (L) 12/18/2014    No results found for: PREALBUMIN CBC EXTENDED Latest Ref Rng &  Units 01/22/2021 10/01/2020 09/21/2020  WBC 3.8 - 10.8 Thousand/uL 9.1 6.2 5.0  RBC 3.80 - 5.10 Million/uL 3.98 3.62(L) 3.32(L)  HGB 11.7 - 15.5 g/dL 12.2 11.4(L) 10.5(L)  HCT 35.0 - 45.0 % 36.8 34.7(L) 31.0(L)  PLT 140 - 400 Thousand/uL 263 242 229  NEUTROABS 1,500 - 7,800 cells/uL 6,361 3,032 -  LYMPHSABS 850 - 3,900 cells/uL 1,674 2,108 -     There is no height or weight on file to calculate BMI.  Orders:  No orders of the defined types were placed in this encounter.  Meds ordered this encounter  Medications   ibuprofen (ADVIL) 800 MG tablet    Sig: Take 1 tablet (800 mg total) by mouth every 8 (eight) hours as needed.    Dispense:  30 tablet    Refill:  0     Procedures: Large Joint Inj: bilateral knee on 02/24/2021 4:44 PM Indications: pain and diagnostic evaluation Details: 22 G 1.5 in needle, anteromedial approach  Arthrogram: No  Medications (Right): 88 mg Hyaluronan 88 MG/4ML; 1 mL lidocaine 1 % Medications (Left): 88 mg Hyaluronan 88 MG/4ML; 1 mL lidocaine 1 % Outcome: tolerated well, no immediate complications Procedure, treatment alternatives, risks and benefits explained, specific risks discussed. Consent was given by the patient. Immediately prior  to procedure a time out was called to verify the correct patient, procedure, equipment, support staff and site/side marked as required. Patient was prepped and draped in the usual sterile fashion.     Clinical Data: No additional findings.  ROS:  All other systems negative, except as noted in the HPI. Review of Systems  Objective: Vital Signs: There were no vitals taken for this visit.  Specialty Comments:  No specialty comments available.  PMFS History: Patient Active Problem List   Diagnosis Date Noted   Bipolar I disorder, most recent episode depressed, severe w psychosis ([place])    Dementia associated with other underlying disease with behavioral disturbance ([place]) 03/27/2020   GERD (gastroesophageal  reflux disease) 12/27/2019   Post-menopausal atrophic vaginitis 12/13/2019   Vagina, candidiasis 12/10/2019   Vaginal itching 11/10/2019   Dysphagia 07/02/2019   Screening examination for STD (sexually transmitted disease) 03/25/2019   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   Other chronic pain 12/03/2018   Dizziness of unknown cause 08/04/2018   Tremor 07/15/2018   Atrial flutter with rapid ventricular response ([place]) 07/10/2017   Atrial fibrillation ([place]) 07/05/2017   COPD (chronic obstructive pulmonary disease) ([place]) 01/22/2017   Pedal edema    Chronic bilateral low back pain without sciatica 08/15/2016   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 08/15/2016   Schizoaffective disorder ([place])    Hypokalemia 01/22/2015   Osteoarthritis, multiple sites 06/08/2011   Dysuria 02/14/2011   CHRONIC KIDNEY DISEASE STAGE II (MILD) 09/14/2009   Hypothyroidism 08/31/2006   Type 2 diabetes mellitus ([place]) 08/31/2006   HYPERCHOLESTEROLEMIA 08/31/2006   Tobacco abuse 08/31/2006   HYPERTENSION, BENIGN SYSTEMIC 08/31/2006   Past Medical History:  Diagnosis Date   Abscessed tooth 02/09/2020   Acute on chronic congestive heart failure ([place])    Adenomatous colon polyp    Arthritis    "real bad; all over" (07/12/2017)   Asthma    Atrial fibrillation ([place])    BIPOLAR DISORDER 08/31/2006   Qualifier: Diagnosis of  By: Dorathy Daft MD, Marjory Lies     CHRONIC KIDNEY DISEASE STAGE II (MILD) 09/14/2009   Annotation: eGFR 63 Qualifier: Diagnosis of  By: Jess Barters MD, Erik     Chronic lower back pain    Congestive heart failure (CHF) ([place])    COPD (chronic obstructive pulmonary disease) ([place]) 09/23/2010   Diagnosed at [facility] in 2008 (Dr. Annamaria Boots)    DM (diabetes mellitus) type II controlled with renal manifestation ([place]) 08/31/2006   Qualifier: Diagnosis of  By: Dorathy Daft MD, Marjory Lies     Dyspnea    "all my life; since 6th grade" (07/12/2017)   Fatty liver    HYPERCHOLESTEROLEMIA 08/31/2006    Intolerance to Lipitor OK on Crestor but medicaid no longer covering     HYPERTENSION, BENIGN SYSTEMIC 08/31/2006   Qualifier: Diagnosis of  By: Dorathy Daft MD, Aaron     Hypokalemia    HYPOTHYROIDISM, UNSPECIFIED 08/31/2006   Qualifier: Diagnosis of  By: Dorathy Daft MD, Marjory Lies     Leg swelling 03/14/2018   Pneumonia    "3 times" (07/12/2017)   Pulmonary nodule    Renal cyst    Schizophrenia ([place])    Scoliosis    Stomach problems    Thyroid disorder     Family History  Problem Relation Age of Onset   Breast cancer Sister    Heart disease Mother 61   Rectal cancer Mother    Diabetes Father 78   Hypertension Father    Heart disease Brother  Asthma Daughter    Asthma Son    Breast cancer Sister    Asthma Daughter    Colon cancer Maternal Grandmother    Stomach cancer Neg Hx    Esophageal cancer Neg Hx    Pancreatic cancer Neg Hx     Past Surgical History:  Procedure Laterality Date   CESAREAN SECTION     FOOT FRACTURE SURGERY Right    "steel plate in it"   FOOT SURGERY     " born w/dislocated foot"   FRACTURE SURGERY     KNEE ARTHROSCOPY Right    TOE SURGERY Bilateral    "both pinky toes"   TONSILLECTOMY AND ADENOIDECTOMY     Social History   Occupational History   Not on file  Tobacco Use   Smoking status: Former    Packs/day: 0.25    Years: 45.00    Pack years: 11.25    Types: Cigarettes   Smokeless tobacco: Never   Tobacco comments:    patient states she smokes 1 cigarette per month  Vaping Use   Vaping Use: Never used  Substance and Sexual Activity   Alcohol use: Not Currently    Comment: 07/12/2017 "stopped 2 yr ago; did have a drink over the holidays recently"   Drug use: No   Sexual activity: Not Currently

## 2021-02-25 ENCOUNTER — Ambulatory Visit (INDEPENDENT_AMBULATORY_CARE_PROVIDER_SITE_OTHER): Payer: Medicare Other | Admitting: Orthopedic Surgery

## 2021-02-25 ENCOUNTER — Ambulatory Visit (INDEPENDENT_AMBULATORY_CARE_PROVIDER_SITE_OTHER): Payer: Medicare Other

## 2021-02-25 DIAGNOSIS — M545 Low back pain, unspecified: Secondary | ICD-10-CM

## 2021-02-25 DIAGNOSIS — M7541 Impingement syndrome of right shoulder: Secondary | ICD-10-CM

## 2021-02-25 DIAGNOSIS — G8929 Other chronic pain: Secondary | ICD-10-CM

## 2021-02-26 ENCOUNTER — Ambulatory Visit: Payer: Medicare Other

## 2021-02-26 ENCOUNTER — Encounter: Payer: Self-pay | Admitting: Orthopedic Surgery

## 2021-02-26 DIAGNOSIS — M7541 Impingement syndrome of right shoulder: Secondary | ICD-10-CM | POA: Diagnosis not present

## 2021-02-26 MED ORDER — LIDOCAINE HCL 1 % IJ SOLN
5.0000 mL | INTRAMUSCULAR | Status: AC | PRN
  Administered 2021-02-26: 5 mL

## 2021-02-26 MED ORDER — METHYLPREDNISOLONE ACETATE 40 MG/ML IJ SUSP
40.0000 mg | INTRAMUSCULAR | Status: AC | PRN
  Administered 2021-02-26: 40 mg via INTRA_ARTICULAR

## 2021-02-26 NOTE — Progress Notes (Signed)
Office Visit Note   Patient: [name] Long           Date of Birth: 1950/07/23           MRN: [id number] Visit Date: 02/25/2021              Requested by: Sandrea Hughs, NP [address] [place],  [place] [address] PCP: Sandrea Hughs, NP  Chief Complaint  Patient presents with   Right Shoulder - Pain   Lower Back - Pain      HPI: Patient patient is a 70 year old woman who presents in follow-up for impingement symptoms of the right shoulder she has had good relief with previous injections.  Patient states that her back pain feels worse.  She states she still has lumbar spine pain with bilateral radicular symptoms.  Assessment & Plan: Visit Diagnoses:  1. Low back pain, unspecified back pain laterality, unspecified chronicity, unspecified whether sciatica present   2. Chronic bilateral low back pain without sciatica   3. Impingement syndrome of right shoulder     Plan: Patient was given a sling to rest her arm she tolerated injection well in the right shoulder.  Follow-Up Instructions: Return if symptoms worsen or fail to improve.   Ortho Exam  Patient is alert, oriented, no adenopathy, well-dressed, normal affect, normal respiratory effort. Examination patient has abduction and flexion of the right shoulder to 90 degrees she has pain with Neer and Hawkins impingement test.  There is no redness no cellulitis no signs of infection.  Patient has no focal motor weakness in either lower extremity.  Imaging: No results found. No images are attached to the encounter.  Labs: Lab Results  Component Value Date   HGBA1C 8.6 (H) 10/01/2020   HGBA1C 8.1 (H) 05/26/2020   HGBA1C 10.3 (A) 03/23/2020   LABORGA NO GROWTH 12/04/2014     Lab Results  Component Value Date   ALBUMIN 3.1 (L) 09/21/2020   ALBUMIN 3.2 (L) 09/02/2020   ALBUMIN 3.3 (L) 04/03/2020    Lab Results  Component Value Date   MG 1.7 09/04/2020   MG 1.7 12/25/2016   MG 1.8 06/10/2016   Lab Results   Component Value Date   VD25OH 43.0 02/23/2018   VD25OH 30 05/06/2015   VD25OH 12 (L) 12/18/2014    No results found for: PREALBUMIN CBC EXTENDED Latest Ref Rng & Units 01/22/2021 10/01/2020 09/21/2020  WBC 3.8 - 10.8 Thousand/uL 9.1 6.2 5.0  RBC 3.80 - 5.10 Million/uL 3.98 3.62(L) 3.32(L)  HGB 11.7 - 15.5 g/dL 12.2 11.4(L) 10.5(L)  HCT 35.0 - 45.0 % 36.8 34.7(L) 31.0(L)  PLT 140 - 400 Thousand/uL 263 242 229  NEUTROABS 1,500 - 7,800 cells/uL 6,361 3,032 -  LYMPHSABS 850 - 3,900 cells/uL 1,674 2,108 -     There is no height or weight on file to calculate BMI.  Orders:  Orders Placed This Encounter  Procedures   XR Lumbar Spine 2-3 Views   No orders of the defined types were placed in this encounter.    Procedures: Large Joint Inj: R subacromial bursa on 02/26/2021 3:11 PM Indications: diagnostic evaluation and pain Details: 22 G 1.5 in needle, posterior approach  Arthrogram: No  Medications: 5 mL lidocaine 1 %; 40 mg methylPREDNISolone acetate 40 MG/ML Outcome: tolerated well, no immediate complications Procedure, treatment alternatives, risks and benefits explained, specific risks discussed. Consent was given by the patient. Immediately prior to procedure a time out was called to verify the correct  patient, procedure, equipment, support staff and site/side marked as required. Patient was prepped and draped in the usual sterile fashion.     Clinical Data: No additional findings.  ROS:  All other systems negative, except as noted in the HPI. Review of Systems  Objective: Vital Signs: There were no vitals taken for this visit.  Specialty Comments:  No specialty comments available.  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   Other chronic pain 12/03/2018   Dizziness of unknown cause 08/04/2018   Tremor 07/15/2018   Atrial flutter with rapid ventricular response ([place]) 07/10/2017   Atrial fibrillation ([place]) 07/05/2017   COPD (chronic obstructive pulmonary disease) ([place]) 01/22/2017   Pedal edema    Chronic bilateral low back pain without sciatica 08/15/2016   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 08/15/2016   Schizoaffective disorder ([place])    Hypokalemia 01/22/2015   Osteoarthritis, multiple sites 06/08/2011   Dysuria 02/14/2011   CHRONIC KIDNEY DISEASE STAGE II (MILD) 09/14/2009   Hypothyroidism 08/31/2006   Type 2 diabetes mellitus ([place]) 08/31/2006   HYPERCHOLESTEROLEMIA 08/31/2006   Tobacco abuse 08/31/2006   HYPERTENSION, BENIGN SYSTEMIC 08/31/2006   Past Medical History:  Diagnosis Date   Abscessed tooth 02/09/2020   Acute on chronic congestive heart failure ([place])    Adenomatous colon polyp    Arthritis    "real bad; all over" (07/12/2017)   Asthma    Atrial fibrillation ([place])    BIPOLAR DISORDER 08/31/2006   Qualifier: Diagnosis of  By: Dorathy Daft MD, Marjory Lies     CHRONIC KIDNEY DISEASE STAGE II (MILD) 09/14/2009   Annotation: eGFR 68 Qualifier: Diagnosis of  By: Jess Barters MD, Erik     Chronic lower back pain    Congestive heart failure (CHF) ([place])    COPD (chronic obstructive pulmonary disease) ([place]) 09/23/2010   Diagnosed at [facility] in 2008 (Dr. Annamaria Boots)    DM (diabetes mellitus) type II controlled with renal manifestation ([place]) 08/31/2006   Qualifier: Diagnosis of  By: Dorathy Daft MD, Marjory Lies     Dyspnea    "all my life; since 6th grade" (07/12/2017)   Fatty liver    HYPERCHOLESTEROLEMIA 08/31/2006   Intolerance to  Lipitor OK on Crestor but medicaid no longer covering     HYPERTENSION, BENIGN SYSTEMIC 08/31/2006   Qualifier: Diagnosis of  By: Dorathy Daft MD, Aaron     Hypokalemia    HYPOTHYROIDISM, UNSPECIFIED 08/31/2006   Qualifier: Diagnosis of  By: Dorathy Daft MD, Marjory Lies     Leg swelling 03/14/2018   Pneumonia    "3 times" (07/12/2017)   Pulmonary nodule    Renal cyst    Schizophrenia ([place])    Scoliosis    Stomach problems    Thyroid disorder     Family History  Problem Relation Age of Onset   Breast cancer Sister    Heart disease Mother 26   Rectal cancer Mother    Diabetes Father 65   Hypertension Father    Heart disease Brother    Asthma Daughter    Asthma Son  Breast cancer Sister    Asthma Daughter    Colon cancer Maternal Grandmother    Stomach cancer Neg Hx    Esophageal cancer Neg Hx    Pancreatic cancer Neg Hx     Past Surgical History:  Procedure Laterality Date   CESAREAN SECTION     FOOT FRACTURE SURGERY Right    "steel plate in it"   FOOT SURGERY     " born w/dislocated foot"   FRACTURE SURGERY     KNEE ARTHROSCOPY Right    TOE SURGERY Bilateral    "both pinky toes"   TONSILLECTOMY AND ADENOIDECTOMY     Social History   Occupational History   Not on file  Tobacco Use   Smoking status: Former    Packs/day: 0.25    Years: 45.00    Pack years: 11.25    Types: Cigarettes   Smokeless tobacco: Never   Tobacco comments:    patient states she smokes 1 cigarette per month  Vaping Use   Vaping Use: Never used  Substance and Sexual Activity   Alcohol use: Not Currently    Comment: 07/12/2017 "stopped 2 yr ago; did have a drink over the holidays recently"   Drug use: No   Sexual activity: Not Currently

## 2021-03-02 ENCOUNTER — Ambulatory Visit: Payer: Self-pay

## 2021-03-02 ENCOUNTER — Encounter: Payer: Self-pay | Admitting: Orthopedic Surgery

## 2021-03-02 ENCOUNTER — Ambulatory Visit (INDEPENDENT_AMBULATORY_CARE_PROVIDER_SITE_OTHER): Payer: Medicare Other | Admitting: Orthopedic Surgery

## 2021-03-02 ENCOUNTER — Ambulatory Visit: Payer: Medicare Other | Admitting: Orthopedic Surgery

## 2021-03-02 DIAGNOSIS — G894 Chronic pain syndrome: Secondary | ICD-10-CM

## 2021-03-02 DIAGNOSIS — M542 Cervicalgia: Secondary | ICD-10-CM | POA: Diagnosis not present

## 2021-03-02 MED ORDER — CYCLOBENZAPRINE HCL 10 MG PO TABS: 10.0000 mg | ORAL_TABLET | Freq: Three times a day (TID) | ORAL | 0 refills | Status: DC | PRN

## 2021-03-02 NOTE — Progress Notes (Signed)
Office Visit Note   Patient: [name] Long           Date of Birth: 1951/06/08           MRN: [id number] Visit Date: 03/02/2021              Requested by: Sandrea Hughs, NP [address] [place],  [place] [address] PCP: Sandrea Hughs, NP  Chief Complaint  Patient presents with   Neck - Pain      HPI: Patient is a 70 year old woman who states that she was putting clothes on a dryer when a partition fell against her right side she complains of neck pain and pain along the entire right side.  Assessment & Plan: Visit Diagnoses:  1. Neck pain   2. Chronic pain syndrome     Plan: We will call in a prescription for Flexeril 10 mg a day recommended heat for her cervical spine.  Follow-Up Instructions: Return if symptoms worsen or fail to improve.   Ortho Exam  Patient is alert, oriented, no adenopathy, well-dressed, normal affect, normal respiratory effort. Examination patient has decreased range of motion of her cervical spine this is unchanged she has tenderness to palpation globally involving the neck and right side.  There is no focal motor weakness in the upper or lower extremity.  Imaging: XR Cervical Spine 2 or 3 views  Result Date: 03/02/2021 2 view radiographs of the cervical spine shows chronic degenerative disc disease with no acute fractures and large osteophytic bone spurs anteriorly.  No images are attached to the encounter.  Labs: Lab Results  Component Value Date   HGBA1C 8.6 (H) 10/01/2020   HGBA1C 8.1 (H) 05/26/2020   HGBA1C 10.3 (A) 03/23/2020   LABORGA NO GROWTH 12/04/2014     Lab Results  Component Value Date   ALBUMIN 3.1 (L) 09/21/2020   ALBUMIN 3.2 (L) 09/02/2020   ALBUMIN 3.3 (L) 04/03/2020    Lab Results  Component Value Date   MG 1.7 09/04/2020   MG 1.7 12/25/2016   MG 1.8 06/10/2016   Lab Results  Component Value Date   VD25OH 43.0 02/23/2018   VD25OH 30 05/06/2015   VD25OH 12 (L) 12/18/2014    No results found for:  PREALBUMIN CBC EXTENDED Latest Ref Rng & Units 01/22/2021 10/01/2020 09/21/2020  WBC 3.8 - 10.8 Thousand/uL 9.1 6.2 5.0  RBC 3.80 - 5.10 Million/uL 3.98 3.62(L) 3.32(L)  HGB 11.7 - 15.5 g/dL 12.2 11.4(L) 10.5(L)  HCT 35.0 - 45.0 % 36.8 34.7(L) 31.0(L)  PLT 140 - 400 Thousand/uL 263 242 229  NEUTROABS 1,500 - 7,800 cells/uL 6,361 3,032 -  LYMPHSABS 850 - 3,900 cells/uL 1,674 2,108 -     There is no height or weight on file to calculate BMI.  Orders:  Orders Placed This Encounter  Procedures   XR Cervical Spine 2 or 3 views   No orders of the defined types were placed in this encounter.    Procedures: No procedures performed  Clinical Data: No additional findings.  ROS:  All other systems negative, except as noted in the HPI. Review of Systems  Objective: Vital Signs: There were no vitals taken for this visit.  Specialty Comments:  No specialty comments available.  PMFS History: Patient Active Problem List   Diagnosis Date Noted   Bipolar I disorder, most recent episode depressed, severe w psychosis ([place])    Dementia associated with other underlying disease with behavioral disturbance ([place]) 03/27/2020   GERD (  gastroesophageal reflux disease) 12/27/2019   Post-menopausal atrophic vaginitis 12/13/2019   Vagina, candidiasis 12/10/2019   Vaginal itching 11/10/2019   Dysphagia 07/02/2019   Screening examination for STD (sexually transmitted disease) 03/25/2019   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   Other chronic pain 12/03/2018   Dizziness of unknown cause 08/04/2018   Tremor 07/15/2018   Atrial flutter with rapid ventricular response ([place]) 07/10/2017   Atrial fibrillation ([place]) 07/05/2017   COPD (chronic obstructive pulmonary disease) ([place]) 01/22/2017   Pedal edema    Chronic bilateral low back pain without sciatica 08/15/2016   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 08/15/2016   Schizoaffective disorder ([place])    Hypokalemia  01/22/2015   Osteoarthritis, multiple sites 06/08/2011   Dysuria 02/14/2011   CHRONIC KIDNEY DISEASE STAGE II (MILD) 09/14/2009   Hypothyroidism 08/31/2006   Type 2 diabetes mellitus ([place]) 08/31/2006   HYPERCHOLESTEROLEMIA 08/31/2006   Tobacco abuse 08/31/2006   HYPERTENSION, BENIGN SYSTEMIC 08/31/2006   Past Medical History:  Diagnosis Date   Abscessed tooth 02/09/2020   Acute on chronic congestive heart failure ([place])    Adenomatous colon polyp    Arthritis    "real bad; all over" (07/12/2017)   Asthma    Atrial fibrillation ([place])    BIPOLAR DISORDER 08/31/2006   Qualifier: Diagnosis of  By: Dorathy Daft MD, Marjory Lies     CHRONIC KIDNEY DISEASE STAGE II (MILD) 09/14/2009   Annotation: eGFR 26 Qualifier: Diagnosis of  By: Jess Barters MD, Erik     Chronic lower back pain    Congestive heart failure (CHF) ([place])    COPD (chronic obstructive pulmonary disease) ([place]) 09/23/2010   Diagnosed at [facility] in [facility] (Dr. Annamaria Boots)    DM (diabetes mellitus) type II controlled with renal manifestation ([place]) 08/31/2006   Qualifier: Diagnosis of  By: Dorathy Daft MD, Marjory Lies     Dyspnea    "all my life; since 6th grade" (07/12/2017)   Fatty liver    HYPERCHOLESTEROLEMIA 08/31/2006   Intolerance to Lipitor OK on Crestor but medicaid no longer covering     HYPERTENSION, BENIGN SYSTEMIC 08/31/2006   Qualifier: Diagnosis of  By: Dorathy Daft MD, Aaron     Hypokalemia    HYPOTHYROIDISM, UNSPECIFIED 08/31/2006   Qualifier: Diagnosis of  By: Dorathy Daft MD, Marjory Lies     Leg swelling 03/14/2018   Pneumonia    "3 times" (07/12/2017)   Pulmonary nodule    Renal cyst    Schizophrenia ([place])    Scoliosis    Stomach problems    Thyroid disorder     Family History  Problem Relation Age of Onset   Breast cancer Sister    Heart disease Mother 64   Rectal cancer Mother    Diabetes Father 62   Hypertension Father    Heart disease Brother    Asthma Daughter    Asthma Son    Breast cancer Sister    Asthma Daughter    Colon cancer  Maternal Grandmother    Stomach cancer Neg Hx    Esophageal cancer Neg Hx    Pancreatic cancer Neg Hx     Past Surgical History:  Procedure Laterality Date   CESAREAN SECTION     FOOT FRACTURE SURGERY Right    "steel plate in it"   FOOT SURGERY     " born w/dislocated foot"   FRACTURE SURGERY     KNEE ARTHROSCOPY Right    TOE SURGERY Bilateral    "both pinky toes"   TONSILLECTOMY AND ADENOIDECTOMY  Social History   Occupational History   Not on file  Tobacco Use   Smoking status: Former    Packs/day: 0.25    Years: 45.00    Pack years: 11.25    Types: Cigarettes   Smokeless tobacco: Never   Tobacco comments:    patient states she smokes 1 cigarette per month  Vaping Use   Vaping Use: Never used  Substance and Sexual Activity   Alcohol use: Not Currently    Comment: 07/12/2017 "stopped 2 yr ago; did have a drink over the holidays recently"   Drug use: No   Sexual activity: Not Currently

## 2021-03-03 ENCOUNTER — Telehealth: Payer: Self-pay | Admitting: Orthopedic Surgery

## 2021-03-03 ENCOUNTER — Telehealth: Payer: Self-pay | Admitting: *Deleted

## 2021-03-03 DIAGNOSIS — Z91199 Patient's noncompliance with other medical treatment and regimen due to unspecified reason: Secondary | ICD-10-CM

## 2021-03-03 DIAGNOSIS — Z9119 Patient's noncompliance with other medical treatment and regimen: Secondary | ICD-10-CM

## 2021-03-03 DIAGNOSIS — F250 Schizoaffective disorder, bipolar type: Secondary | ICD-10-CM

## 2021-03-03 NOTE — Telephone Encounter (Signed)
Pt called stating she had an appt yesterday and she told him she didn't need a rx for any ibuprofen but she's having some pain and would like to have some called in. Pt would like to notified when this has been done.   [phone]

## 2021-03-03 NOTE — Telephone Encounter (Signed)
Please forward to dr duda as her daughter has asked we not give her this

## 2021-03-03 NOTE — Telephone Encounter (Signed)
Referral made 

## 2021-03-03 NOTE — Telephone Encounter (Signed)
I have called the patient multiple times, no answer and her voicemail is not setup

## 2021-03-03 NOTE — Telephone Encounter (Signed)
Patient daughter, [name] Long, called and stated that she is patient's Legal Guardian.   Requesting a Referral to be placed for Geriatric Psychotherapy for patient's Mental Health.  Stated that Webb Silversmith knows patient.   Requesting referral.

## 2021-03-05 ENCOUNTER — Other Ambulatory Visit: Payer: Self-pay | Admitting: Family

## 2021-03-05 MED ORDER — METHOCARBAMOL 500 MG PO TABS: 500.0000 mg | ORAL_TABLET | Freq: Four times a day (QID) | ORAL | 0 refills | Status: DC | PRN

## 2021-03-09 ENCOUNTER — Ambulatory Visit: Payer: Medicare Other | Admitting: Orthopedic Surgery

## 2021-03-11 ENCOUNTER — Telehealth: Payer: Self-pay | Admitting: Family
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
## 2021-03-12 NOTE — Telephone Encounter (Signed)
Patient daughter called and stated that the ER will accept her but they will not keep her.   Daughter is wanting to speak with you regarding her Options for Geriatric Psych. Care.   Wants you to call her [phone] states she does not know what to do.

## 2021-03-12 NOTE — Telephone Encounter (Signed)
Appointment scheduled for Monday with Daughter.

## 2021-03-12 NOTE — Telephone Encounter (Signed)
Called patient daughter "Tyleah Wride" and no answer. Voicemail was left with office call back number.

## 2021-03-12 NOTE — Telephone Encounter (Signed)
Send to ED if behaviors are worsening.

## 2021-03-12 NOTE — Telephone Encounter (Signed)
Schedule visit with patient and daughter to evaluate

## 2021-03-15 ENCOUNTER — Telehealth (INDEPENDENT_AMBULATORY_CARE_PROVIDER_SITE_OTHER): Payer: Medicare Other | Admitting: Family

## 2021-03-15 ENCOUNTER — Other Ambulatory Visit: Payer: Self-pay

## 2021-03-15 ENCOUNTER — Encounter: Payer: Self-pay | Admitting: Family

## 2021-03-15 DIAGNOSIS — F250 Schizoaffective disorder, bipolar type: Secondary | ICD-10-CM

## 2021-03-15 DIAGNOSIS — R451 Restlessness and agitation: Secondary | ICD-10-CM | POA: Diagnosis not present
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
## 2021-03-15 NOTE — Patient Instructions (Signed)
Please pick up telephone numbers for [facility] area Psychiatry then call to schedule appointment

## 2021-03-16 ENCOUNTER — Telehealth: Payer: Self-pay | Admitting: Family

## 2021-03-16 ENCOUNTER — Other Ambulatory Visit: Payer: Self-pay | Admitting: Family

## 2021-03-16 ENCOUNTER — Other Ambulatory Visit: Payer: Self-pay

## 2021-03-16 DIAGNOSIS — K219 Gastro-esophageal reflux disease without esophagitis: Secondary | ICD-10-CM

## 2021-03-16 DIAGNOSIS — N183 Chronic kidney disease, stage 3 unspecified: Secondary | ICD-10-CM

## 2021-03-16 MED ORDER — SUCRALFATE 1 GM/10ML PO SUSP: ORAL | 0 refills | Status: DC

## 2021-03-16 MED ORDER — PENICILLIN V POTASSIUM 500 MG PO TABS: 500.0000 mg | ORAL_TABLET | Freq: Four times a day (QID) | ORAL | 0 refills | Status: DC

## 2021-03-16 MED ORDER — DAPAGLIFLOZIN PROPANEDIOL 5 MG PO TABS: 5.0000 mg | ORAL_TABLET | Freq: Every day | ORAL | 1 refills | Status: DC

## 2021-03-16 MED ORDER — LEVOTHYROXINE SODIUM 25 MCG PO TABS: ORAL_TABLET | ORAL | 3 refills | Status: DC

## 2021-03-16 MED ORDER — SITAGLIPTIN PHOSPHATE 50 MG PO TABS: 50.0000 mg | ORAL_TABLET | Freq: Every day | ORAL | 1 refills | Status: DC

## 2021-03-16 MED ORDER — OMEPRAZOLE 40 MG PO CPDR: 40.0000 mg | DELAYED_RELEASE_CAPSULE | Freq: Two times a day (BID) | ORAL | 1 refills | Status: DC

## 2021-03-16 NOTE — Telephone Encounter (Signed)
Omeprazole recommend as once daily taking it more frequent can be prevent other absorption of vitamins in the body.If symptoms are worsening would recommend adding Famotidine daily at bedtime

## 2021-03-16 NOTE — Telephone Encounter (Signed)
Need office visit to evaluate ears problems.

## 2021-03-16 NOTE — Telephone Encounter (Signed)
Patient called wanting an antibiotic eardrop, her ears are hurting her glands and its affecting her sinus's and causing a lot of drainage

## 2021-03-16 NOTE — Telephone Encounter (Signed)
Patient is in need of refills. Patient states she is not able to reach her other doctor and would like for [place] to prescribe them. She also states the omeprazole should be changed to four times a day and need more quantity. To [name] to confirm prescriptions and change in directions on the omeprazole.

## 2021-03-18 NOTE — Telephone Encounter (Signed)
Noted  

## 2021-03-19 ENCOUNTER — Ambulatory Visit: Payer: Self-pay

## 2021-03-19 ENCOUNTER — Telehealth: Payer: Self-pay

## 2021-03-19 ENCOUNTER — Other Ambulatory Visit: Payer: Self-pay

## 2021-03-19 ENCOUNTER — Ambulatory Visit (INDEPENDENT_AMBULATORY_CARE_PROVIDER_SITE_OTHER): Payer: Medicare Other | Admitting: Physician Assistant

## 2021-03-19 ENCOUNTER — Other Ambulatory Visit: Payer: Self-pay | Admitting: Physician Assistant

## 2021-03-19 DIAGNOSIS — G8929 Other chronic pain: Secondary | ICD-10-CM

## 2021-03-19 DIAGNOSIS — M25512 Pain in left shoulder: Secondary | ICD-10-CM

## 2021-03-19 DIAGNOSIS — M545 Low back pain, unspecified: Secondary | ICD-10-CM

## 2021-03-19 MED ORDER — IBUPROFEN 800 MG PO TABS: 800.0000 mg | ORAL_TABLET | Freq: Three times a day (TID) | ORAL | 0 refills | Status: DC | PRN

## 2021-03-19 MED ORDER — METHOCARBAMOL 500 MG PO TABS: 500.0000 mg | ORAL_TABLET | Freq: Four times a day (QID) | ORAL | 0 refills | Status: DC | PRN
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
## 2021-03-19 NOTE — Telephone Encounter (Signed)
Discussed with the patient.
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
## 2021-03-19 NOTE — Telephone Encounter (Signed)
Unable to reach patient and unable to leave voicemail.

## 2021-03-24 ENCOUNTER — Telehealth: Payer: Self-pay | Admitting: Orthopedic Surgery

## 2021-03-24 NOTE — Telephone Encounter (Signed)
02/16/21 bilateral monovisc gel injections would not be eligible until 08/19/20

## 2021-03-24 NOTE — Telephone Encounter (Signed)
Pt called wanting to get an authorization sent to [facility] for bilateral knee gel inj.   [phone]

## 2021-03-25 ENCOUNTER — Ambulatory Visit: Payer: Medicare Other | Admitting: Family

## 2021-03-25 NOTE — Telephone Encounter (Signed)
Correct

## 2021-03-25 NOTE — Telephone Encounter (Signed)
After 08/19/2021.

## 2021-03-26 ENCOUNTER — Telehealth: Payer: Self-pay

## 2021-03-26 NOTE — Telephone Encounter (Signed)
Tried calling patient to inform her that her insurance would not cover Januvia and that prescription will be changed to Tradjenta 5mg  tablet one daily. Change request faxed to [facility]. Voicemail not set up on patient phone

## 2021-03-27 ENCOUNTER — Other Ambulatory Visit: Payer: Self-pay | Admitting: Family

## 2021-04-01 ENCOUNTER — Telehealth: Payer: Self-pay | Admitting: Orthopedic Surgery

## 2021-04-01 NOTE — Telephone Encounter (Signed)
Pt stated she is on the sch for tomorrow to receive injections but she wanted to verify what she was eligible for tomorrow. She mentioned knees injs but appt notes have it listed as a return office visit only. Pt wanted a call back before close today to know what she is coming for tomorrow. The best call back number for the pt is [phone].

## 2021-04-02 ENCOUNTER — Ambulatory Visit: Payer: Medicare Other | Admitting: Physician Assistant

## 2021-04-02 NOTE — Telephone Encounter (Signed)
I have tried to call patient multiple times. She can not get visco  until early next year and just had steroid shots in her knees and shoulder about a month ago so is not sue for any injections

## 2021-04-04 ENCOUNTER — Other Ambulatory Visit: Payer: Self-pay | Admitting: Orthopedic Surgery

## 2021-04-04 ENCOUNTER — Other Ambulatory Visit: Payer: Self-pay | Admitting: Physician Assistant

## 2021-04-05 ENCOUNTER — Ambulatory Visit: Payer: Medicare Other | Admitting: Family

## 2021-04-05 ENCOUNTER — Telehealth: Payer: Self-pay | Admitting: Family

## 2021-04-05 ENCOUNTER — Telehealth: Payer: Self-pay

## 2021-04-05 ENCOUNTER — Telehealth: Payer: Self-pay | Admitting: Physician Assistant
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
## 2021-04-05 NOTE — Telephone Encounter (Signed)
Pt called again to check on getting a refill for both her rxs. Pt would like a CB as she states her back disc is slipping.

## 2021-04-05 NOTE — Telephone Encounter (Signed)
ERROR

## 2021-04-05 NOTE — Telephone Encounter (Signed)
Please verify with patient's daughter then may refill meds as requested.

## 2021-04-05 NOTE — Telephone Encounter (Signed)
Pt would like a refill of her ibuprofen 800 mg and her robaxin rxs; and would like a CB when that's done. Pt has some questions about the waiting period in between injections and may want to talk about that.   [phone]

## 2021-04-06 ENCOUNTER — Ambulatory Visit: Payer: Medicare Other | Admitting: Obstetrics and Gynecology

## 2021-04-06 NOTE — Telephone Encounter (Signed)
Per Dr. Sharol Given I have already spoke with this pt this morning. The Ibuprofen rx was filled and this single rx should be available for pick up. I had advised the pt that Dr. Sharol Given denied the rx for the muscle relaxer. The pt last had gel injections on 02/16/21 bilateral monovisc gel injections and per insurance will not be eligible to have again until after 08/19/20 this has been discussed while in office

## 2021-04-06 NOTE — Telephone Encounter (Signed)
I called pt and advised that per Dr. Sharol Given ok to refill the Ibuprofen but not able to refill  the robaxin at this time.

## 2021-04-06 NOTE — Telephone Encounter (Signed)
Pt called back stating no one called her this morning and she needs Dr.Duda to send in her robaxin as well as her hydrocodone rxs; she states Dr.Duda was given a letter from the state that says he's supposed to help her out with anything she needs. Pt states if Dr.Duda continues to refuse her she will call the city on him. Pt also states our office gave her injections to a Doran Clay, Johnathan Hausen, and another pt. Pt states she didn't receive a gel injection on 02/16/21 it was a cortisone inj that the PA with a pony tail gave her. Pt is requesting a CB to discuss further.

## 2021-04-06 NOTE — Telephone Encounter (Signed)
Pt called very upset stating when she went to the pharmacy there was only 1 rx for ibuprofen and she states she's supposed to have 4 rxs of ibuprofen and 4 rxs of the robaxin. Pt states we have been giving out her medicine and injections to other people and she's going to sue Dr.Duda. Pt is also upset about the time frame of her injections she states she remembers doing them earlier in the year ans it should be time to get them again. Pt would like to talk to Dr.Duda or Audrea Muscat but not Aut[facility].  [phone]

## 2021-04-06 NOTE — Telephone Encounter (Signed)
I called pt and voicemail box has not been set up wet. We are happy to see her in the office if she would like to discuss medication with Dr. Sharol Given. Pt is unwilling to accept that Dr. Sharol Given denied the rx for robaxin and that she has already had her monovisc injections in August.

## 2021-04-07 ENCOUNTER — Other Ambulatory Visit: Payer: Self-pay

## 2021-04-07 DIAGNOSIS — N183 Chronic kidney disease, stage 3 unspecified: Secondary | ICD-10-CM

## 2021-04-07 DIAGNOSIS — I10 Essential (primary) hypertension: Secondary | ICD-10-CM

## 2021-04-07 DIAGNOSIS — I129 Hypertensive chronic kidney disease with stage 1 through stage 4 chronic kidney disease, or unspecified chronic kidney disease: Secondary | ICD-10-CM

## 2021-04-07 MED ORDER — GLUCOSE BLOOD VI STRP: ORAL_STRIP | 12 refills | Status: DC

## 2021-04-07 MED ORDER — PENICILLIN V POTASSIUM 500 MG PO TABS: 500.0000 mg | ORAL_TABLET | Freq: Four times a day (QID) | ORAL | 0 refills | Status: DC

## 2021-04-07 MED ORDER — ALBUTEROL SULFATE 1.25 MG/3ML IN NEBU: INHALATION_SOLUTION | RESPIRATORY_TRACT | 1 refills | Status: DC

## 2021-04-07 MED ORDER — AMLODIPINE BESYLATE 5 MG PO TABS: 5.0000 mg | ORAL_TABLET | Freq: Every day | ORAL | 1 refills | Status: DC

## 2021-04-07 MED ORDER — LEVOTHYROXINE SODIUM 25 MCG PO TABS: ORAL_TABLET | ORAL | 3 refills | Status: DC

## 2021-04-07 MED ORDER — METFORMIN HCL 1000 MG PO TABS: 1000.0000 mg | ORAL_TABLET | Freq: Two times a day (BID) | ORAL | 1 refills | Status: DC

## 2021-04-07 MED ORDER — SITAGLIPTIN PHOSPHATE 50 MG PO TABS: 50.0000 mg | ORAL_TABLET | Freq: Every day | ORAL | 1 refills | Status: DC

## 2021-04-07 MED ORDER — ROSUVASTATIN CALCIUM 40 MG PO TABS: 40.0000 mg | ORAL_TABLET | Freq: Every day | ORAL | 3 refills | Status: DC

## 2021-04-07 MED ORDER — HYDROCHLOROTHIAZIDE 25 MG PO TABS: ORAL_TABLET | ORAL | 3 refills | Status: DC
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
## 2021-04-07 NOTE — Telephone Encounter (Signed)
Refill medication per patient's request

## 2021-04-07 NOTE — Telephone Encounter (Signed)
Patient request refill on medications. Ok to fill per Marlowe Sax, NP. Crestor came up with very high warning when trying to fill.   Medication pended and went to Marlowe Sax, NP for approval.

## 2021-04-09 NOTE — Telephone Encounter (Signed)
Pt called into the office stating that she is going to have Dr. Sharol Given arrested since he will not return her phone calls. She stated that she hasn't received her Gel injections and that Caren Griffins came in to our office and used her name and got her injections and then hung up the phone.

## 2021-04-09 NOTE — Telephone Encounter (Signed)
noted 

## 2021-04-12 ENCOUNTER — Telehealth: Payer: Self-pay | Admitting: Family

## 2021-04-12 NOTE — Telephone Encounter (Signed)
[name] called wanting potassium & other meds filled. 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  When she called today(10/10), she has decided that she wants to re-instate her relationship as a pt here at [facility].  I offered an appt for 10/11 at 1130a to see [name] Long & refill meds that are needed. Ms [name] Long stated that she doesn't need an appt, all she needs is a 3 way call from [place] to her & her daughter([name] Long) POA, for permission to get the meds that have been requested. Also offered for Ms [name] Long to speak with triage & she refused as well  Please advise, Vilinda Blanks

## 2021-04-13 NOTE — Telephone Encounter (Signed)
Will need a virtual visit with daughter and patient.

## 2021-04-16 DIAGNOSIS — H2513 Age-related nuclear cataract, bilateral: Secondary | ICD-10-CM | POA: Diagnosis not present

## 2021-04-17 ENCOUNTER — Emergency Department (HOSPITAL_COMMUNITY)
Admission: EM | Admit: 2021-04-17 | Discharge: 2021-04-19 | Disposition: A | Discharge status: Other Acute Inpatient Hospital | Payer: Medicare Other | Attending: Emergency Medicine | Admitting: Emergency Medicine

## 2021-04-17 ENCOUNTER — Other Ambulatory Visit: Payer: Self-pay

## 2021-04-17 ENCOUNTER — Encounter (HOSPITAL_COMMUNITY): Payer: Self-pay | Admitting: *Deleted

## 2021-04-17 DIAGNOSIS — E876 Hypokalemia: Secondary | ICD-10-CM

## 2021-04-17 DIAGNOSIS — J45909 Unspecified asthma, uncomplicated: Secondary | ICD-10-CM | POA: Insufficient documentation

## 2021-04-17 DIAGNOSIS — F250 Schizoaffective disorder, bipolar type: Secondary | ICD-10-CM | POA: Diagnosis not present

## 2021-04-17 DIAGNOSIS — J449 Chronic obstructive pulmonary disease, unspecified: Secondary | ICD-10-CM | POA: Insufficient documentation

## 2021-04-17 DIAGNOSIS — I130 Hypertensive heart and chronic kidney disease with heart failure and stage 1 through stage 4 chronic kidney disease, or unspecified chronic kidney disease: Secondary | ICD-10-CM | POA: Diagnosis not present

## 2021-04-17 DIAGNOSIS — Z046 Encounter for general psychiatric examination, requested by authority: Secondary | ICD-10-CM | POA: Insufficient documentation

## 2021-04-17 DIAGNOSIS — E039 Hypothyroidism, unspecified: Secondary | ICD-10-CM | POA: Diagnosis not present

## 2021-04-17 DIAGNOSIS — Z20822 Contact with and (suspected) exposure to covid-19: Secondary | ICD-10-CM | POA: Diagnosis not present

## 2021-04-17 DIAGNOSIS — I4891 Unspecified atrial fibrillation: Secondary | ICD-10-CM | POA: Insufficient documentation

## 2021-04-17 DIAGNOSIS — I509 Heart failure, unspecified: Secondary | ICD-10-CM | POA: Diagnosis not present

## 2021-04-17 DIAGNOSIS — N182 Chronic kidney disease, stage 2 (mild): Secondary | ICD-10-CM | POA: Diagnosis not present

## 2021-04-17 DIAGNOSIS — R443 Hallucinations, unspecified: Secondary | ICD-10-CM | POA: Diagnosis not present

## 2021-04-17 DIAGNOSIS — Z87891 Personal history of nicotine dependence: Secondary | ICD-10-CM | POA: Diagnosis not present

## 2021-04-17 DIAGNOSIS — E1122 Type 2 diabetes mellitus with diabetic chronic kidney disease: Secondary | ICD-10-CM | POA: Insufficient documentation

## 2021-04-17 DIAGNOSIS — Y900 Blood alcohol level of less than 20 mg/100 ml: Secondary | ICD-10-CM | POA: Diagnosis not present

## 2021-04-17 LAB — COMPREHENSIVE METABOLIC PANEL
ALT: 16 U/L (ref 0–44)
AST: 19 U/L (ref 15–41)
Albumin: 3.4 g/dL — ABNORMAL LOW (ref 3.5–5.0)
Alkaline Phosphatase: 94 U/L (ref 38–126)
Anion gap: 10 (ref 5–15)
BUN: 25 mg/dL — ABNORMAL HIGH (ref 8–23)
CO2: 28 mmol/L (ref 22–32)
Calcium: 9 mg/dL (ref 8.9–10.3)
Chloride: 96 mmol/L — ABNORMAL LOW (ref 98–111)
Creatinine, Ser: 1.64 mg/dL — ABNORMAL HIGH (ref 0.44–1.00)
GFR, Estimated: 34 mL/min — ABNORMAL LOW (ref >60)
Glucose, Bld: 176 mg/dL — ABNORMAL HIGH (ref 70–99)
Potassium: 2.4 mmol/L — CL (ref 3.5–5.1)
Sodium: 134 mmol/L — ABNORMAL LOW (ref 135–145)
Total Bilirubin: 0.5 mg/dL (ref 0.3–1.2)
Total Protein: 7.6 g/dL (ref 6.5–8.1)

## 2021-04-17 LAB — RAPID URINE DRUG SCREEN, HOSP PERFORMED
Amphetamines: NOT DETECTED
Barbiturates: NOT DETECTED
Benzodiazepines: NOT DETECTED
Cocaine: NOT DETECTED
Opiates: NOT DETECTED
Tetrahydrocannabinol: NOT DETECTED

## 2021-04-17 LAB — CBC WITH DIFFERENTIAL/PLATELET
Abs Immature Granulocytes: 0.01 10*3/uL (ref 0.00–0.07)
Basophils Absolute: 0 10*3/uL (ref 0.0–0.1)
Basophils Relative: 1 %
Eosinophils Absolute: 0.3 10*3/uL (ref 0.0–0.5)
Eosinophils Relative: 5 %
HCT: 34.2 % — ABNORMAL LOW (ref 36.0–46.0)
Hemoglobin: 11.2 g/dL — ABNORMAL LOW (ref 12.0–15.0)
Immature Granulocytes: 0 %
Lymphocytes Relative: 42 %
Lymphs Abs: 2.7 10*3/uL (ref 0.7–4.0)
MCH: 31.7 pg (ref 26.0–34.0)
MCHC: 32.7 g/dL (ref 30.0–36.0)
MCV: 96.9 fL (ref 80.0–100.0)
Monocytes Absolute: 0.8 10*3/uL (ref 0.1–1.0)
Monocytes Relative: 13 %
Neutro Abs: 2.4 10*3/uL (ref 1.7–7.7)
Neutrophils Relative %: 39 %
Platelets: 249 10*3/uL (ref 150–400)
RBC: 3.53 MIL/uL — ABNORMAL LOW (ref 3.87–5.11)
RDW: 16.6 % — ABNORMAL HIGH (ref 11.5–15.5)
WBC: 6.3 10*3/uL (ref 4.0–10.5)
nRBC: 0 % (ref 0.0–0.2)

## 2021-04-17 LAB — ETHANOL: Alcohol, Ethyl (B): <10 mg/dL (ref <10)

## 2021-04-17 MED ORDER — POTASSIUM CHLORIDE CRYS ER 20 MEQ PO TBCR
40.0000 meq | EXTENDED_RELEASE_TABLET | Freq: Once | ORAL | Status: AC
  Administered 2021-04-17: 40 meq via ORAL
  Filled 2021-04-17 (×2): qty 2
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
## 2021-04-17 NOTE — ED Notes (Signed)
Guardian Karmen [phone] would like an update asap
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
## 2021-04-18 LAB — BASIC METABOLIC PANEL
Anion gap: 8 (ref 5–15)
BUN: 21 mg/dL (ref 8–23)
CO2: 28 mmol/L (ref 22–32)
Calcium: 9.2 mg/dL (ref 8.9–10.3)
Chloride: 99 mmol/L (ref 98–111)
Creatinine, Ser: 1.42 mg/dL — ABNORMAL HIGH (ref 0.44–1.00)
GFR, Estimated: 40 mL/min — ABNORMAL LOW (ref >60)
Glucose, Bld: 179 mg/dL — ABNORMAL HIGH (ref 70–99)
Potassium: 3 mmol/L — ABNORMAL LOW (ref 3.5–5.1)
Sodium: 135 mmol/L (ref 135–145)

## 2021-04-18 LAB — RESP PANEL BY RT-PCR (FLU A&B, COVID) ARPGX2
Influenza A by PCR: NEGATIVE
Influenza B by PCR: NEGATIVE
SARS Coronavirus 2 by RT PCR: NEGATIVE

## 2021-04-18 MED ORDER — METFORMIN HCL 500 MG PO TABS
1000.0000 mg | ORAL_TABLET | Freq: Two times a day (BID) | ORAL | Status: DC
  Administered 2021-04-19: 1000 mg via ORAL
  Filled 2021-04-18: qty 2

## 2021-04-18 MED ORDER — LORAZEPAM 1 MG PO TABS: 1.0000 mg | ORAL_TABLET | ORAL | Status: DC | PRN

## 2021-04-18 MED ORDER — METOPROLOL SUCCINATE ER 25 MG PO TB24
50.0000 mg | ORAL_TABLET | Freq: Every day | ORAL | Status: DC
  Administered 2021-04-18 – 2021-04-19 (×2): 50 mg via ORAL
  Filled 2021-04-18 (×2): qty 2

## 2021-04-18 MED ORDER — LEVOTHYROXINE SODIUM 25 MCG PO TABS
25.0000 ug | ORAL_TABLET | Freq: Every day | ORAL | Status: DC
  Administered 2021-04-19: 25 ug via ORAL
  Filled 2021-04-18: qty 1

## 2021-04-18 MED ORDER — IBUPROFEN 800 MG PO TABS
800.0000 mg | ORAL_TABLET | Freq: Once | ORAL | Status: AC
  Administered 2021-04-18: 800 mg via ORAL
  Filled 2021-04-18: qty 1

## 2021-04-18 MED ORDER — ALBUTEROL SULFATE HFA 108 (90 BASE) MCG/ACT IN AERS: 2.0000 | INHALATION_SPRAY | Freq: Four times a day (QID) | RESPIRATORY_TRACT | Status: DC | PRN

## 2021-04-18 MED ORDER — ZIPRASIDONE HCL 20 MG PO CAPS
20.0000 mg | ORAL_CAPSULE | Freq: Two times a day (BID) | ORAL | Status: DC
  Filled 2021-04-18 (×4): qty 1

## 2021-04-18 MED ORDER — POTASSIUM CHLORIDE CRYS ER 20 MEQ PO TBCR: 40.0000 meq | EXTENDED_RELEASE_TABLET | Freq: Once | ORAL | Status: DC

## 2021-04-18 MED ORDER — AMLODIPINE BESYLATE 5 MG PO TABS
5.0000 mg | ORAL_TABLET | Freq: Every day | ORAL | Status: DC
  Administered 2021-04-18 – 2021-04-19 (×2): 5 mg via ORAL
  Filled 2021-04-18 (×2): qty 1

## 2021-04-18 MED ORDER — HYDROCHLOROTHIAZIDE 25 MG PO TABS
25.0000 mg | ORAL_TABLET | Freq: Every day | ORAL | Status: DC
  Administered 2021-04-18 – 2021-04-19 (×2): 25 mg via ORAL
  Filled 2021-04-18 (×2): qty 1

## 2021-04-18 MED ORDER — ZIPRASIDONE MESYLATE 20 MG IM SOLR
20.0000 mg | INTRAMUSCULAR | Status: DC | PRN
  Filled 2021-04-18: qty 20

## 2021-04-18 MED ORDER — RISPERIDONE 1 MG PO TBDP
2.0000 mg | ORAL_TABLET | Freq: Three times a day (TID) | ORAL | Status: DC | PRN
  Filled 2021-04-18: qty 2

## 2021-04-18 MED ORDER — POTASSIUM CHLORIDE CRYS ER 10 MEQ PO TBCR
10.0000 meq | EXTENDED_RELEASE_TABLET | Freq: Two times a day (BID) | ORAL | Status: DC
  Administered 2021-04-18 – 2021-04-19 (×3): 10 meq via ORAL
  Filled 2021-04-18 (×3): qty 1

## 2021-04-18 MED ORDER — ACETAMINOPHEN 325 MG PO TABS
650.0000 mg | ORAL_TABLET | ORAL | Status: DC | PRN
  Administered 2021-04-19 (×2): 650 mg via ORAL
  Filled 2021-04-18 (×2): qty 2

## 2021-04-18 MED ORDER — POTASSIUM CHLORIDE CRYS ER 20 MEQ PO TBCR
40.0000 meq | EXTENDED_RELEASE_TABLET | Freq: Once | ORAL | Status: AC
  Administered 2021-04-18: 40 meq via ORAL
  Filled 2021-04-18: qty 2

## 2021-04-18 MED ORDER — PANTOPRAZOLE SODIUM 40 MG PO TBEC
40.0000 mg | DELAYED_RELEASE_TABLET | Freq: Every day | ORAL | Status: DC
  Administered 2021-04-18 – 2021-04-19 (×2): 40 mg via ORAL
  Filled 2021-04-18 (×2): qty 1

## 2021-04-18 MED ORDER — LINAGLIPTIN 5 MG PO TABS
5.0000 mg | ORAL_TABLET | Freq: Every day | ORAL | Status: DC
  Administered 2021-04-18 – 2021-04-19 (×2): 5 mg via ORAL
  Filled 2021-04-18: qty 1

## 2021-04-18 NOTE — ED Provider Notes (Signed)
70 yo ho schizophrenia with worsening delusional thoughts recommended for geriatric psych Physical Exam  BP 128/78   Pulse 98   Temp 97.8 F (36.6 C) (Oral)   Resp 16   SpO2 98%   Physical Exam  ED Course/Procedures     Procedures  MDM  Patient with significant hypokalemia pending repeat Taking hctz, and bp here currently normal. Will recheck potassium  Recheck potassium improved to 3.0 and another dose of potassium ordered.  Patient appears stable for [facility] psych disposition.  Will place hold orders that include daily potassium.       Pattricia Boss, MD 04/18/21 [phone]

## 2021-04-18 NOTE — BH Assessment (Signed)
Comprehensive Clinical Assessment (CCA) Note  04/18/2021 [name] [id number]  DISPOSITION: Gave clinical report to [name] Reasoner, NP who determined Pt meets criteria for inpatient geriatric-psychiatry. Appropriate facilities will contacted for placement. Notified Dr. Gerlene [facility] and [name] Prince, RN of recommendation via secure message. Notified Pt's daughter/legal guardian [name] of recommendation.  [name] requests that Pt be hospitalized at a facility that is not out of the area, that a long drive would cause a hardship for family members.  The patient demonstrates the following risk factors for suicide: Chronic risk factors for suicide include: psychiatric disorder of schizoaffective disorder . Acute risk factors for suicide include: N/A. Protective factors for this patient include: positive social support, responsibility to others (children, family), and hope for the future. Considering these factors, the overall suicide risk at this point appears to be low. Patient is not appropriate for outpatient follow up.  [facility] from 04/17/2021 in [facility] from 09/02/2020 in [facility] from 04/03/2020 in [place] [facility] No Risk Error: Q3, 4, or 5 should not be populated when Q2 is No No Risk      Pt is a 70 year old divorced female who [facility]accompanied [name] via law enforcement after being petitioned for involuntary commitment by her daughter/legal guardian [name] [phone]. Affidavit and petition states: "Respondent suffers from pschoaffective, and bipolar. She is hearing voices and carrying around a knife threatening neighbors and the people attempting to assist her in her home. Respondent will not take her medication and is becoming a danger to herself as well as not being able to attend for her  mental health or medical needs."  Pt's medical record indicates Pt is diagnosed with schizoaffective disorder, bipolar type and dementia. She says she came to the ED because she is "allergic to everything" and there are things flying around her house that hit her eyes and make them red. Pt says they are also bothering her ears, causing ringing. Pt denies depressive symptoms and describes her mood as good. She says she is fearful at times. She says her sleep is interrupted. She describes her appetite as good. She says she recently broke her collar bone. When asked how she injured it, Pt says someone change a table from plastic to metal and "I was set up." Pt states someone access her medical record, learned where her organs are located, and created a voodoo doll to cause her pain and make her age too fast. She says that someone is using voodoo on her family and she worries about her children. Pt says someone has "crossed the wires" on her cell phone and there are people talking on the line, so she wants a flip phone. Pt denies current suicidal ideation or history of suicide attempts. She denies thoughts of harming others but says if someone threatens her she will defends herself. She denies auditory or visual hallucinations. She denies use of alcohol or other substances.  Pt states she lives alone. She says she wants to move to a different part of town and live with family. She says she has four children and has a good relationship with them. She say she no longer takes psychiatric medications because the doctor at [name] said the mediation she was taking was not good for people over age 1. Pt acknowledges she has been psychiatrically hospitalized before but that people told lies and  she really did not need to be there. She denies history of abuse. She denies legal problems. She denies access to firearms.  TTS contacted Pt's daughter/legal guardian [name]. She confirmed the information in the  affidavit. She says Pt's mental health problems have been an ongoing problem that has worsened over the past several weeks. She says Pt needs to be on psychiatric medication.  Pt is dressed in hospital scrubs, sweater, and cap. She is alert and oriented to person and place, not date or situation. Pt speaks in a clear tone, at moderate volume and normal pace. Motor behavior appears normal. Eye contact is good. Pt's mood is euthymic and affect is congruent with mood. Thought process is coherent but often tangential with delusional content. Pt's insight is poor and judgment is impaired. She was calm and cooperative throughout assessment. She says she would like some eyedrops.   Chief Complaint:  Chief Complaint  Patient presents with   Hallucinations   Visit Diagnosis: F25.0 Schizoaffective disorder, Bipolar type   CCA Screening, Triage and Referral (STR)  Patient Reported Information How did you hear about [place]? Legal System  What Is the Reason for Your Visit/Call Today? Pt has diagnosis of schizoaffetive disorder, bipolar type and dementia. She was petitioned by her legal guardian who states Pt is having auditory hallucinations and has been threatening neighbors and home aides with a knife. Pt reports she is not taking psychiatric medications. She appears delusional and believes people have access her medical record and made a voodoo doll to cause her pain and make her age too fast.  How Long Has This Been Causing You Problems? > than 6 months  What Do You Feel Would Help You the Most Today? Treatment for Depression or other mood problem; Medication(s)   Have You Recently Had Any Thoughts About Hurting Yourself? No  Are You Planning to Commit Suicide/Harm Yourself At This time? No   Have you Recently Had Thoughts About [place]? No  Are You Planning to Harm Someone at This Time? No  Explanation: No data recorded  Have You Used Any Alcohol or Drugs in the Past 24 Hours?  No  How Long Ago Did You Use Drugs or Alcohol? No data recorded What Did You Use and How Much? No data recorded  Do You Currently Have a Therapist/Psychiatrist? Yes  Name of Therapist/Psychiatrist: [facility]   Have You Been Recently Discharged From Any Office Practice or Programs? No  Explanation of Discharge From Practice/Program: No data recorded    CCA Screening Triage Referral Assessment Type of Contact: Tele-Assessment  Telemedicine Service Delivery: Telemedicine service delivery: This service was provided via telemedicine using a 2-way, interactive audio and video technology  Is this Initial or Reassessment? Initial Assessment  Date Telepsych consult ordered in [facility]:  04/18/21  Time Telepsych consult ordered in [facility]:  0346  Location of Assessment: [facility]  Provider Location: [facility]   Collateral Involvement: [name], daughter/legal guardian, [phone]   Does Patient Have a Court Appointed Legal Guardian? No data recorded Name and Contact of Legal Guardian: No data recorded If Minor and Not Living with Parent(s), Who has Custody? n/a  Is CPS involved or ever been involved? Never  Is APS involved or ever been involved? In the past   Patient Determined To Be At Risk for Harm To Self or Others Based on Review of Patient Reported Information or Presenting Complaint? Yes, for Self-Harm  Method: No data recorded Availability of Means: No  data recorded Intent: No data recorded Notification Required: No data recorded Additional Information for Danger to Others Potential: No data recorded Additional Comments for Danger to Others Potential: No data recorded Are There Guns or Other Weapons in Your Home? No data recorded Types of Guns/Weapons: No data recorded Are These Weapons Safely Secured?                            No data recorded Who Could Verify You Are Able To Have These Secured: No data recorded Do You Have any Outstanding Charges,  Pending Court Dates, Parole/Probation? No data recorded Contacted To Inform of Risk of Harm To Self or Others: Family/Significant Other:; Guardian/MH POA:    Does Patient Present under Involuntary Commitment? Yes  IVC Papers Initial File Date: 04/18/21   [place] of Residence: [place]   Patient Currently Receiving the Following Services: Medication Management   Determination of Need: Emergent (2 hours)   Options For Referral: Inpatient Hospitalization     CCA Biopsychosocial Patient Reported Schizophrenia/Schizoaffective Diagnosis in Past: Yes   Strengths: Pt likes talking to people.   Mental Health Symptoms Depression:   Change in energy/activity; Difficulty Concentrating; Fatigue; Irritability   Duration of Depressive symptoms:  Duration of Depressive Symptoms: Greater than two weeks   Mania:   Change in energy/activity; Irritability; Recklessness   Anxiety:    Difficulty concentrating; Fatigue; Irritability; Restlessness; Sleep; Tension; Worrying   Psychosis:   Delusions; Hallucinations   Duration of Psychotic symptoms:  Duration of Psychotic Symptoms: Greater than six months   Trauma:   None   Obsessions:   None   Compulsions:   None   Inattention:   Disorganized; Forgetful; Does not seem to listen   Hyperactivity/Impulsivity:   Feeling of restlessness; Fidgets with hands/feet   Oppositional/Defiant Behaviors:   None   Emotional Irregularity:   Transient, stress-related paranoia/disassociation   Other Mood/Personality Symptoms:   NA    Mental Status Exam Appearance and self-care  Stature:   Average   Weight:   Average weight   Clothing:   -- (Scrubs, hat, sweater)   Grooming:   Normal   Cosmetic use:   Age appropriate   Posture/gait:   Normal   Motor activity:   Not Remarkable   Sensorium  Attention:   Normal   Concentration:   Variable   Orientation:   Object; Person; Place   Recall/memory:   Defective in  Short-term; Defective in Immediate   Affect and Mood  Affect:   Appropriate   Mood:   Euthymic   Relating  Eye contact:   Normal   Facial expression:   Responsive   Attitude toward examiner:   Cooperative   Thought and Language  Speech flow:  Normal   Thought content:   Delusions; Persecutions   Preoccupation:   Ruminations   Hallucinations:   Auditory   Organization:  No data recorded  [employer] of Knowledge:   Fair   Intelligence:   Average   Abstraction:   Normal   Judgement:   Poor   Reality Testing:   Distorted   Insight:   Poor   Decision Making:   Vacilates   Social Functioning  Social Maturity:   Isolates; Impulsive   Social Judgement:   Normal   Stress  Stressors:   Illness   Coping Ability:   Overwhelmed; Exhausted   Skill Deficits:   Self-care; Decision making; Communication   Supports:  Family     Religion: Religion/Spirituality Are You A Religious Person?: Yes What is Your Religious Affiliation?: Christian How Might This Affect Treatment?: NA  Leisure/Recreation: Leisure / Recreation Do You Have Hobbies?: Yes Leisure and Hobbies: Watching baseball  Exercise/Diet: Exercise/Diet Do You Exercise?: No Have You Gained or Lost A Significant Amount of Weight in the Past Six Months?: No Do You Follow a Special Diet?: Yes Type of Diet: Diabetic Do You Have Any Trouble Sleeping?: Yes Explanation of Sleeping Difficulties: Pt reports frequent waking   CCA Employment/Education Employment/Work Situation: Employment / Work Situation Employment Situation: On disability Why is Patient on Disability: Mental health How Long has Patient Been on Disability: Since 1992 Patient's Job has Been Impacted by Current Illness: No Has Patient ever Been in the [employer]?: No  Education: Education Is Patient Currently Attending School?: No Last Grade Completed: 12 Did You Attend College?: No Did You Have An  Individualized Education Program (IIEP): No Did You Have Any Difficulty At School?: No Patient's Education Has Been Impacted by Current Illness: No   CCA Family/Childhood History Family and Relationship History: Family history Marital status: Divorced Divorced, when?: Since 1991 What types of issues is patient dealing with in the relationship?: Pt did not answer Additional relationship information: n/a Does patient have children?: Yes How many children?: 4 How is patient's relationship with their children?: Close relationship with children  Childhood History:  Childhood History By whom was/is the patient raised?: Both parents Did patient suffer any verbal/emotional/physical/sexual abuse as a child?: No Did patient suffer from severe childhood neglect?: No Has patient ever been sexually abused/assaulted/raped as an adolescent or adult?: No Was the patient ever a victim of a crime or a disaster?: No Witnessed domestic violence?: No Has patient been affected by domestic violence as an adult?: No  Child/Adolescent Assessment:     CCA Substance Use Alcohol/Drug Use: Alcohol / Drug Use Pain Medications: See MRA Prescriptions: See MRA Over the Counter: See MRA History of alcohol / drug use?: No history of alcohol / drug abuse                         ASAM's:  Six Dimensions of Multidimensional Assessment  Dimension 1:  Acute Intoxication and/or Withdrawal Potential:      Dimension 2:  Biomedical Conditions and Complications:      Dimension 3:  Emotional, Behavioral, or Cognitive Conditions and Complications:     Dimension 4:  Readiness to Change:     Dimension 5:  Relapse, Continued use, or Continued Problem Potential:     Dimension 6:  Recovery/Living Environment:     ASAM Severity Score:    ASAM Recommended Level of Treatment:     Substance use Disorder (SUD)    Recommendations for Services/Supports/Treatments:    Discharge Disposition: Discharge  Disposition Medical Exam completed: Yes  DSM5 Diagnoses: Patient Active Problem List   Diagnosis Date Noted   Bipolar I disorder, most recent episode depressed, severe w psychosis ([place])    Dementia associated with other underlying disease with behavioral disturbance 03/27/2020   GERD (gastroesophageal reflux disease) 12/27/2019   Post-menopausal atrophic vaginitis 12/13/2019   Vagina, candidiasis 12/10/2019   Vaginal itching 11/10/2019   Dysphagia 07/02/2019   Screening examination for STD (sexually transmitted disease) 03/25/2019   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   Other chronic pain 12/03/2018   Dizziness of unknown cause 08/04/2018   Tremor 07/15/2018   Atrial flutter with rapid ventricular response ([place]) 07/10/2017  Atrial fibrillation ([place]) 07/05/2017   COPD (chronic obstructive pulmonary disease) ([place]) 01/22/2017   Pedal edema    Chronic bilateral low back pain without sciatica 08/15/2016   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 08/15/2016   Schizoaffective disorder ([place])    Hypokalemia 01/22/2015   Osteoarthritis, multiple sites 06/08/2011   Dysuria 02/14/2011   CHRONIC KIDNEY DISEASE STAGE II (MILD) 09/14/2009   Hypothyroidism 08/31/2006   Type 2 diabetes mellitus ([place]) 08/31/2006   HYPERCHOLESTEROLEMIA 08/31/2006   Tobacco abuse 08/31/2006   HYPERTENSION, BENIGN SYSTEMIC 08/31/2006     Referrals to Alternative Service(s): Referred to Alternative Service(s):   Place:   Date:   Time:    Referred to Alternative Service(s):   Place:   Date:   Time:    Referred to Alternative Service(s):   Place:   Date:   Time:    Referred to Alternative Service(s):   Place:   Date:   Time:     Evelena Peat, [facility]

## 2021-04-18 NOTE — ED Notes (Signed)
Patient provided with decaf coffee per her request.

## 2021-04-18 NOTE — ED Notes (Signed)
Patient moved to private room for TTS. TTS set up. Sitter remains outside of patients room.

## 2021-04-18 NOTE — ED Notes (Signed)
Pts daughter given pt valuables

## 2021-04-18 NOTE — ED Notes (Signed)
Patients legal guardian called and updated by this RN.

## 2021-04-18 NOTE — Progress Notes (Signed)
Per Clovis Riley, patient meets criteria for inpatient treatment. There are no available or appropriate beds at [facility] today. CSW faxed referrals to the following facilities for review:  [facility]  Pending - No Request Sent N/A [address], [place] [address] [phone] [facility] [address], [place] [address] [phone] --  [place] [address] [place] [address] [phone] --  [facility]  Pending - No Request Sent N/A [address], [place] [address] [phone] --  [facility]  Pending - No Request Sent N/A [address] [place], [place] [address] [phone] --  [facility]  Pending - No Request Sent N/A [address] [place] [address] [phone] --  [facility]  Pending - No Request Sent N/A [place] [place]  [address] [phone] --  [facility]  Pending - No Request Sent N/A [address], [place] [address] [phone] --  [facility]  Pending - No Request Sent N/A [address], [place] [address] [phone] --    TTS will continue to seek bed placement.  [place]ie [place], MSW, [place], LCAS-A Phone: [phone] Disposition/TOC

## 2021-04-18 NOTE — BH Assessment (Signed)
Pt's daughter/legal guardian Billijo Dilling [phone] says she wants to be informed of any updates regarding Pt's placement or changes in clinical recommendations.   Evelena Peat, [facility], [facility] Triage Specialist [phone]

## 2021-04-18 NOTE — ED Provider Notes (Signed)
[facility] Emergency Department Provider Note MRN:  [id number]  Arrival date & time: 04/18/21     Chief Complaint   Hallucinations   History of Present Illness   [name] Long is a 70 y.o. year-old female with a history of schizophrenia, hypertension, diabetes, COPD presenting to the ED with chief complaint of hallucinations.  Patient reportedly hallucinating that her upstairs neighbor is putting tasers in her ears and told the police that someone tased her vagina.  She was also found to be holding a knife to protect herself.  IVC paperwork initiated by daughter.  Patient denies any hallucinations, denies knowing any reason why she would be here in the emergency department.  She is alert and oriented and denies any homicidal or suicidal ideation, no hallucinations, no drugs or alcohol.  Endorsing chronic pain to her hands and feet which she attributes to her known neuropathy.  No other complaints.  Review of Systems  A complete 10 system review of systems was obtained and all systems are negative except as noted in the HPI and PMH.   Patient's Health History    Past Medical History:  Diagnosis Date   Abscessed tooth 02/09/2020   Acute on chronic congestive heart failure ([place])    Adenomatous colon polyp    Arthritis    "real bad; all over" (07/12/2017)   Asthma    Atrial fibrillation ([place])    BIPOLAR DISORDER 08/31/2006   Qualifier: Diagnosis of  By: Dorathy Daft MD, Marjory Lies     CHRONIC KIDNEY DISEASE STAGE II (MILD) 09/14/2009   Annotation: eGFR 35 Qualifier: Diagnosis of  By: Jess Barters MD, [name]     Chronic lower back pain    Congestive heart failure (CHF) (HCC)    COPD (chronic obstructive pulmonary disease) ([place]) 09/23/2010   Diagnosed at [facility] in 2008 (Dr. Annamaria Boots)    DM (diabetes mellitus) type II controlled with renal manifestation ([place]) 08/31/2006   Qualifier: Diagnosis of  By: Dorathy Daft MD, Marjory Lies     Dyspnea    "all my life; since 6th grade"  (07/12/2017)   Fatty liver    HYPERCHOLESTEROLEMIA 08/31/2006   Intolerance to Lipitor OK on Crestor but medicaid no longer covering     HYPERTENSION, BENIGN SYSTEMIC 08/31/2006   Qualifier: Diagnosis of  By: Dorathy Daft MD, Aaron     Hypokalemia    HYPOTHYROIDISM, UNSPECIFIED 08/31/2006   Qualifier: Diagnosis of  By: Dorathy Daft MD, Marjory Lies     Leg swelling 03/14/2018   Pneumonia    "3 times" (07/12/2017)   Pulmonary nodule    Renal cyst    Schizophrenia ([place])    Scoliosis    Stomach problems    Thyroid disorder     Past Surgical History:  Procedure Laterality Date   CESAREAN SECTION     FOOT FRACTURE SURGERY Right    "steel plate in it"   FOOT SURGERY     " born w/dislocated foot"   FRACTURE SURGERY     KNEE ARTHROSCOPY Right    TOE SURGERY Bilateral    "both pinky toes"   TONSILLECTOMY AND ADENOIDECTOMY      Family History  Problem Relation Age of Onset   Breast cancer Sister    Heart disease Mother 83   Rectal cancer Mother    Diabetes Father 67   Hypertension Father    Heart disease Brother    Asthma Daughter    Asthma Son    Breast cancer Sister    Asthma Daughter  Colon cancer Maternal Grandmother    Stomach cancer Neg Hx    Esophageal cancer Neg Hx    Pancreatic cancer Neg Hx     Social History   Socioeconomic History   Marital status: Divorced    Spouse name: Not on file   Number of children: 4   Years of education: 12   Highest education level: High school graduate  Occupational History   Not on file  Tobacco Use   Smoking status: Former    Packs/day: 0.25    Years: 45.00    Pack years: 11.25    Types: Cigarettes   Smokeless tobacco: Never   Tobacco comments:    patient states she smokes 1 cigarette per month  Vaping Use   Vaping Use: Never used  Substance and Sexual Activity   Alcohol use: Not Currently    Comment: 07/12/2017 "stopped 2 yr ago; did have a drink over the holidays recently"   Drug use: No   Sexual activity: Not Currently  Other  Topics Concern   Not on file  Social History Narrative   Patient lives alone in [place].    Patient does not drive, she uses insurance transportation or her children.   Patient enjoys twirling her paton, listening to music, dancing, and spending time with family.    Patient enjoys celebrating all holidays and birthdays of loved ones.   Social Determinants of Health   Financial Resource Strain: Not on file  Food Insecurity: Not on file  Transportation Needs: Not on file  Physical Activity: Not on file  Stress: Not on file  Social Connections: Not on file  Intimate Partner Violence: Not on file     Physical Exam   Vitals:   04/18/21 0333  BP: 128/78  Pulse: 98  Resp: 16  Temp: 97.8 F (36.6 C)  SpO2: 98%    CONSTITUTIONAL: Well-appearing, NAD NEURO:  Alert and oriented x 3, no focal deficits EYES:  eyes equal and reactive ENT/NECK:  no LAD, no JVD CARDIO: Regular rate, well-perfused, normal S1 and S2 PULM:  CTAB no wheezing or rhonchi GI/GU:  normal bowel sounds, non-distended, non-tender MSK/SPINE:  No gross deformities, no edema SKIN:  no rash, atraumatic PSYCH: Occasional tangential speech, mildly anxious behavior  *Additional and/or pertinent findings included in MDM below  Diagnostic and Interventional Summary    EKG Interpretation  Date/Time:    Ventricular Rate:    PR Interval:    QRS Duration:   QT Interval:    QTC Calculation:   R Axis:     Text Interpretation:         Labs Reviewed  COMPREHENSIVE METABOLIC PANEL - Abnormal; Notable for the following components:      Result Value   Sodium 134 (*)    Potassium 2.4 (*)    Chloride 96 (*)    Glucose, Bld 176 (*)    BUN 25 (*)    Creatinine, Ser 1.64 (*)    Albumin 3.4 (*)    GFR, Estimated 34 (*)    All other components within normal limits  CBC WITH DIFFERENTIAL/PLATELET - Abnormal; Notable for the following components:   RBC 3.53 (*)    Hemoglobin 11.2 (*)    HCT 34.2 (*)    RDW 16.6  (*)    All other components within normal limits  RESP PANEL BY RT-PCR (FLU A&B, COVID) ARPGX2  ETHANOL  RAPID URINE DRUG SCREEN, HOSP PERFORMED  BASIC METABOLIC PANEL    No orders  to display    Medications  potassium chloride SA (KLOR-CON) CR tablet 40 mEq (40 mEq Oral Given 04/17/21 2142)  potassium chloride SA (KLOR-CON) CR tablet 40 mEq (40 mEq Oral Given 04/18/21 0408)     Procedures  /  Critical Care Procedures  ED Course and Medical Decision Making  I have reviewed the triage vital signs, the nursing notes, and pertinent available records from the EMR.  Listed above are laboratory and imaging tests that I personally ordered, reviewed, and interpreted and then considered in my medical decision making (see below for details).  Reported hallucinations, suspect instability of patient's known schizophrenia.  Patient denies of this.  She is fully alert and oriented, she does have some tangential speech and makes a lot of claims that may not be true.  IVC paperwork is established by daughter, well complete first examination paperwork and await TTS recommendations  Screening labs have revealed a potassium of 2.4.  Awaiting EKG.  Will provide second dose of potassium, recheck BMP later in the morning to see if we can obtain medical clearance regarding this incidental finding.     Signed out to oncoming provider at shift change.  TTS recommending inpatient.  Awaiting medical clearance.  Barth Kirks. Sedonia Small, [place] [email or web address]  Final Clinical Impressions(s) / ED Diagnoses     ICD-10-CM   1. Hallucinations  R44.3     2. Hypokalemia  E87.6       ED Discharge Orders     None        Discharge Instructions Discussed with and Provided to Patient:   Discharge Instructions   None       Maudie Flakes, MD 04/18/21 [phone]

## 2021-04-18 NOTE — ED Notes (Signed)
Daughter picked up belongings from locker

## 2021-04-18 NOTE — ED Notes (Addendum)
PT belongings placed in locker 2 Valuables with security

## 2021-04-18 NOTE — ED Notes (Signed)
Pt up at nursing station asking about her belongings and wanting to take ibuprofen. Pt does not have current order for ibuprofen and refuses to take tylenol. Pt also refusing to take PO Geodon. Pt daughter called and states that she has pts cane. Pt on phone with daughter at this time.

## 2021-04-18 NOTE — Progress Notes (Signed)
CSW followed-up with [facility] admission who advised that there are currently no available beds at the facility and to call back tomorrow afternoon for possible discharges.    Glennie [place], MSW, [place], LCAS-A Phone: [phone] Disposition/TOC

## 2021-04-18 NOTE — ED Notes (Signed)
Pt continues to talk to the wall. Pt getting increasing agitated at the nurses station asking for her clothes and purse so she can go home. EDP made aware.

## 2021-04-19 ENCOUNTER — Inpatient Hospital Stay
Admission: AD | Admit: 2021-04-19 | Discharge: 2021-05-06 | DRG: 885 | Disposition: A | Discharge status: Home / Self Care | Payer: Medicare Other | Source: Intra-hospital | Attending: Psychiatry | Admitting: Psychiatry

## 2021-04-19 ENCOUNTER — Encounter: Payer: Self-pay | Admitting: Psychiatry

## 2021-04-19 ENCOUNTER — Encounter (HOSPITAL_COMMUNITY): Payer: Self-pay | Admitting: Registered Nurse

## 2021-04-19 ENCOUNTER — Telehealth: Payer: Self-pay

## 2021-04-19 ENCOUNTER — Other Ambulatory Visit: Payer: Self-pay

## 2021-04-19 DIAGNOSIS — Z20822 Contact with and (suspected) exposure to covid-19: Secondary | ICD-10-CM | POA: Diagnosis present

## 2021-04-19 DIAGNOSIS — E1122 Type 2 diabetes mellitus with diabetic chronic kidney disease: Secondary | ICD-10-CM | POA: Diagnosis present

## 2021-04-19 DIAGNOSIS — Z7982 Long term (current) use of aspirin: Secondary | ICD-10-CM

## 2021-04-19 DIAGNOSIS — E876 Hypokalemia: Secondary | ICD-10-CM | POA: Diagnosis present

## 2021-04-19 DIAGNOSIS — N183 Chronic kidney disease, stage 3 unspecified: Secondary | ICD-10-CM

## 2021-04-19 DIAGNOSIS — I10 Essential (primary) hypertension: Secondary | ICD-10-CM | POA: Diagnosis present

## 2021-04-19 DIAGNOSIS — E7800 Pure hypercholesterolemia, unspecified: Secondary | ICD-10-CM | POA: Diagnosis present

## 2021-04-19 DIAGNOSIS — N182 Chronic kidney disease, stage 2 (mild): Secondary | ICD-10-CM | POA: Diagnosis present

## 2021-04-19 DIAGNOSIS — Z79899 Other long term (current) drug therapy: Secondary | ICD-10-CM

## 2021-04-19 DIAGNOSIS — E119 Type 2 diabetes mellitus without complications: Secondary | ICD-10-CM

## 2021-04-19 DIAGNOSIS — F259 Schizoaffective disorder, unspecified: Principal | ICD-10-CM | POA: Diagnosis present

## 2021-04-19 DIAGNOSIS — I130 Hypertensive heart and chronic kidney disease with heart failure and stage 1 through stage 4 chronic kidney disease, or unspecified chronic kidney disease: Secondary | ICD-10-CM | POA: Diagnosis present

## 2021-04-19 DIAGNOSIS — Z7984 Long term (current) use of oral hypoglycemic drugs: Secondary | ICD-10-CM | POA: Diagnosis not present

## 2021-04-19 DIAGNOSIS — E039 Hypothyroidism, unspecified: Secondary | ICD-10-CM | POA: Diagnosis present

## 2021-04-19 DIAGNOSIS — I4891 Unspecified atrial fibrillation: Secondary | ICD-10-CM | POA: Diagnosis present

## 2021-04-19 DIAGNOSIS — F250 Schizoaffective disorder, bipolar type: Secondary | ICD-10-CM | POA: Diagnosis not present

## 2021-04-19 DIAGNOSIS — Z87891 Personal history of nicotine dependence: Secondary | ICD-10-CM

## 2021-04-19 DIAGNOSIS — J449 Chronic obstructive pulmonary disease, unspecified: Secondary | ICD-10-CM | POA: Diagnosis present

## 2021-04-19 DIAGNOSIS — I129 Hypertensive chronic kidney disease with stage 1 through stage 4 chronic kidney disease, or unspecified chronic kidney disease: Secondary | ICD-10-CM

## 2021-04-19 DIAGNOSIS — I480 Paroxysmal atrial fibrillation: Secondary | ICD-10-CM | POA: Diagnosis present

## 2021-04-19 LAB — BASIC METABOLIC PANEL
Anion gap: 10 (ref 5–15)
BUN: 28 mg/dL — ABNORMAL HIGH (ref 8–23)
CO2: 27 mmol/L (ref 22–32)
Calcium: 9.8 mg/dL (ref 8.9–10.3)
Chloride: 97 mmol/L — ABNORMAL LOW (ref 98–111)
Creatinine, Ser: 1.62 mg/dL — ABNORMAL HIGH (ref 0.44–1.00)
GFR, Estimated: 34 mL/min — ABNORMAL LOW (ref >60)
Glucose, Bld: 149 mg/dL — ABNORMAL HIGH (ref 70–99)
Potassium: 3.3 mmol/L — ABNORMAL LOW (ref 3.5–5.1)
Sodium: 134 mmol/L — ABNORMAL LOW (ref 135–145)

## 2021-04-19 LAB — LIPID PANEL
Cholesterol: 166 mg/dL (ref 0–200)
HDL: 43 mg/dL (ref >40)
LDL Cholesterol: 80 mg/dL (ref 0–99)
Total CHOL/HDL Ratio: 3.9 RATIO
Triglycerides: 213 mg/dL — ABNORMAL HIGH (ref <150)
VLDL: 43 mg/dL — ABNORMAL HIGH (ref 0–40)

## 2021-04-19 LAB — TSH: TSH: 1.615 u[IU]/mL (ref 0.350–4.500)

## 2021-04-19 MED ORDER — HYDROCHLOROTHIAZIDE 25 MG PO TABS
25.0000 mg | ORAL_TABLET | Freq: Every day | ORAL | Status: DC
  Administered 2021-04-20 – 2021-05-06 (×16): 25 mg via ORAL
  Filled 2021-04-19 (×17): qty 1

## 2021-04-19 MED ORDER — POTASSIUM CHLORIDE CRYS ER 10 MEQ PO TBCR
10.0000 meq | EXTENDED_RELEASE_TABLET | Freq: Two times a day (BID) | ORAL | Status: DC
  Administered 2021-04-19 – 2021-05-06 (×32): 10 meq via ORAL
  Filled 2021-04-19 (×33): qty 1

## 2021-04-19 MED ORDER — LEVOTHYROXINE SODIUM 25 MCG PO TABS
25.0000 ug | ORAL_TABLET | Freq: Every day | ORAL | Status: DC
  Administered 2021-04-20 – 2021-05-06 (×17): 25 ug via ORAL
  Filled 2021-04-19 (×21): qty 1

## 2021-04-19 MED ORDER — METOPROLOL SUCCINATE ER 25 MG PO TB24
50.0000 mg | ORAL_TABLET | Freq: Every day | ORAL | Status: DC
  Administered 2021-04-20 – 2021-05-06 (×16): 50 mg via ORAL
  Filled 2021-04-19 (×17): qty 2

## 2021-04-19 MED ORDER — METFORMIN HCL 500 MG PO TABS
1000.0000 mg | ORAL_TABLET | Freq: Two times a day (BID) | ORAL | Status: DC
  Administered 2021-04-19 – 2021-05-06 (×34): 1000 mg via ORAL
  Filled 2021-04-19 (×37): qty 2

## 2021-04-19 MED ORDER — LINAGLIPTIN 5 MG PO TABS
5.0000 mg | ORAL_TABLET | Freq: Every day | ORAL | Status: DC
  Administered 2021-04-20 – 2021-04-25 (×6): 5 mg via ORAL
  Filled 2021-04-19 (×8): qty 1

## 2021-04-19 MED ORDER — ACETAMINOPHEN 325 MG PO TABS
650.0000 mg | ORAL_TABLET | ORAL | Status: DC | PRN
  Administered 2021-04-24: 650 mg via ORAL
  Filled 2021-04-19: qty 2

## 2021-04-19 MED ORDER — STERILE WATER FOR INJECTION IJ SOLN
INTRAMUSCULAR | Status: AC
  Filled 2021-04-19: qty 10

## 2021-04-19 MED ORDER — ALBUTEROL SULFATE HFA 108 (90 BASE) MCG/ACT IN AERS
2.0000 | INHALATION_SPRAY | Freq: Four times a day (QID) | RESPIRATORY_TRACT | Status: DC | PRN
  Administered 2021-04-26 – 2021-04-27 (×3): 2 via RESPIRATORY_TRACT
  Filled 2021-04-19 (×2): qty 6.7

## 2021-04-19 MED ORDER — ACETAMINOPHEN 325 MG PO TABS
650.0000 mg | ORAL_TABLET | Freq: Four times a day (QID) | ORAL | Status: DC | PRN
  Administered 2021-04-19 – 2021-05-05 (×6): 650 mg via ORAL
  Filled 2021-04-19 (×8): qty 2

## 2021-04-19 MED ORDER — PANTOPRAZOLE SODIUM 40 MG PO TBEC
40.0000 mg | DELAYED_RELEASE_TABLET | Freq: Every day | ORAL | Status: DC
  Administered 2021-04-20 – 2021-05-06 (×17): 40 mg via ORAL
  Filled 2021-04-19 (×17): qty 1

## 2021-04-19 MED ORDER — ALUM & MAG HYDROXIDE-SIMETH 200-200-20 MG/5ML PO SUSP
30.0000 mL | ORAL | Status: DC | PRN
  Administered 2021-04-26 – 2021-05-05 (×4): 30 mL via ORAL
  Filled 2021-04-19 (×4): qty 30

## 2021-04-19 MED ORDER — ZIPRASIDONE HCL 20 MG PO CAPS
20.0000 mg | ORAL_CAPSULE | Freq: Two times a day (BID) | ORAL | Status: DC
Start: 2021-04-19 — End: 2021-04-20
  Filled 2021-04-19 (×2): qty 1

## 2021-04-19 MED ORDER — MAGNESIUM HYDROXIDE 400 MG/5ML PO SUSP
30.0000 mL | Freq: Every day | ORAL | Status: DC | PRN
  Filled 2021-04-19: qty 30

## 2021-04-19 MED ORDER — AMLODIPINE BESYLATE 5 MG PO TABS
5.0000 mg | ORAL_TABLET | Freq: Every day | ORAL | Status: DC
  Administered 2021-04-20 – 2021-05-06 (×16): 5 mg via ORAL
  Filled 2021-04-19 (×17): qty 1

## 2021-04-19 NOTE — ED Notes (Addendum)
Pt is sleeping at this time - will obtain vitals when pt is awake

## 2021-04-19 NOTE — ED Notes (Signed)
Pt educated that the tylenol is the substitute for ibuprofen due to the allergy

## 2021-04-19 NOTE — Progress Notes (Signed)
Recreation Therapy Notes  INPATIENT RECREATION THERAPY ASSESSMENT  Patient Details Name: [name] Long MRN: [id number] DOB: [birth year] Today's Date: 04/19/2021       Information Obtained From:  Patient refused expressed that she is too busy right now and to come back later.   Able to Participate in Assessment/Interview:    Patient Presentation:    Reason for Admission (Per Patient):    Patient Stressors:    Coping Skills:      Leisure Interests (2+):     Frequency of Recreation/Participation:    Awareness of Community Resources:     [employer]:     Current Use:    If no, Barriers?:    Expressed Interest in [place] of Residence:     Patient Main Form of Transportation:    Patient Strengths:     Patient Identified Areas of Improvement:     Patient Goal for Hospitalization:     Current SI (including self-harm):     Current HI:     Current AVH:    Staff Intervention Plan:    Consent to Intern Participation:    [name] 04/19/2021, 4:06 PM

## 2021-04-19 NOTE — Progress Notes (Signed)
Patient information has been sent to [facility] via secure chat to review for potential admission. Patient meets inpatient criteria per [name], NP.   Situation ongoing, CSW will continue to monitor progress.    Signed:  Mariea Clonts, MSW, LCSW-A  04/19/2021 9:51 AM

## 2021-04-19 NOTE — Progress Notes (Signed)
Pt accepted to [facility] Unit L26      Patient meets inpatient criteria per [name], NP  The attending provider will be Dr. Lance Morin, RN @ [facility] ED notified.     Pt scheduled  to arrive at [facility] today after safety issue is resolved on the unit.    Mariea Clonts, MSW, LCSW-A  11:01 AM 04/19/2021

## 2021-04-19 NOTE — ED Notes (Signed)
Per night shift RN pt's daughter [name] took all belongings and valuables home prior to this RN's shift.  RN called report to Vinnie Level RN @ [facility] geri-psych unit. They will accept pt but apparently having a problem with a door lock on their unit so will accept after that has been repaired. Deferred calling sheriff for transport until confirmed with Vinnie Level that the door was repaired.  Pt was amenable to morning meds except the geodon; states "I don't have any psychiatric issues, I'm not taking that!" Pt was also agreeable to checking BMP for potassium recheck, RN drew blood without complication. Afterwards RN updated pt on plan of care and impending transfer to [place]; pt became very agitated, stated all of the ED staff were going to jail and that she was being held against her will. RN attempted to communicate IVC status and plan of care but pt became increasingly agitated so RN ended conversation there. Pt remains paranoid and delusional, guarded and distrustful of staff. Sitter at bedside, no distress noted, will continue to monitor.

## 2021-04-19 NOTE — Tx Team (Signed)
Initial Treatment Plan 04/19/2021 5:34 PM [name] Long FEO:[id number]    PATIENT STRESSORS: Loss of Father and husband 1 year ago     PATIENT STRENGTHS: Supportive family/friends    PATIENT IDENTIFIED PROBLEMS: Pt believes people are tasing her.                       DISCHARGE CRITERIA:  Ability to meet basic life and health needs Improved stabilization in mood, thinking, and/or behavior Motivation to continue treatment in a less acute level of care  PRELIMINARY DISCHARGE PLAN: Return to previous living arrangement  PATIENT/FAMILY INVOLVEMENT: This treatment plan has been presented to and reviewed with the patient, [name] Long, and/or family member, [name] Long.  The patient and family have been given the opportunity to ask questions and make suggestions.  Conard Novak, RN 04/19/2021, 5:34 PM

## 2021-04-19 NOTE — BH Assessment (Signed)
Patient is to be admitted to [facility] Unit today 04/19/21 by Dr. Weber Cooks.  Attending Physician will be Dr.  Weber Cooks .   Patient has been assigned to room L26, by [facility] Charge Nurse, Vinnie Level.   Call report to: [phone]  ETA is unknown at this time due to safety issues.

## 2021-04-19 NOTE — Telephone Encounter (Signed)
Please call patient POA to verify requested medication.

## 2021-04-19 NOTE — H&P (Signed)
Psychiatric Admission Assessment Adult  Patient Identification: [name] Long MRN:  [id number] Date of Evaluation:  04/19/2021 Chief Complaint:  Schizoaffective disorder ([place]) [F25.9] Principal Diagnosis: Schizoaffective disorder ([place]) Diagnosis:  Principal Problem:   Schizoaffective disorder ([place]) Active Problems:   Hypothyroidism   Type 2 diabetes mellitus ([place])   HYPERCHOLESTEROLEMIA   HYPERTENSION, BENIGN SYSTEMIC   Hypokalemia   Atrial fibrillation (HCC)  History of Present Illness: A 70 year old woman transferred from [place].  Brought to the hospital with paranoia and reports by her family that she had been threatening to family members had been off her medicine and had not been taking care of herself.  Patient has poor insight.  On interview she is disorganized and rambling.  Frequent paranoid comments about her family.  When left by herself in a room she was talking loudly to herself and appeared to be responding to internal stimuli and talking to hallucinations.  Patient denies suicidal or homicidal ideation.  Her insight is poor.  She admits that she has been off her psychiatric medicine.  She claims it has been 7 years and that she stopped them because her doctor told them they were no good for older people.  She thinks she has no reason to be back on them. Associated Signs/Symptoms: Depression Symptoms:  difficulty concentrating, anxiety, Duration of Depression Symptoms: Greater than two weeks  (Hypo) Manic Symptoms:  Distractibility, Elevated Mood, Impulsivity, Irritable Mood, Labiality of Mood, Anxiety Symptoms:  Excessive Worry, Psychotic Symptoms:  Delusions, Hallucinations: Auditory Ideas of Reference, Paranoia, PTSD Symptoms: Negative Total Time spent with patient: 1 hour  Past Psychiatric History: Patient has a past history of schizoaffective disorder.  Last hospitalization here was a few years ago.  She was on Depakote and Risperdal at that time.  These  seem to have been her chronic medications for the most part although she does have some familiarity with Haldol and other antipsychotics.  Also has been on Zyprexa in the past.  No history of suicide attempts at all reported.  Patient does have a history of some agitation but not serious violence.  Is the patient at risk to self? Yes.    Has the patient been a risk to self in the past 6 months? Yes.    Has the patient been a risk to self within the distant past? Yes.    Is the patient a risk to others? Yes.    Has the patient been a risk to others in the past 6 months? Yes.    Has the patient been a risk to others within the distant past? Yes.     Prior Inpatient Therapy:   Prior Outpatient Therapy:    Alcohol Screening: Patient refused Alcohol Screening Tool: Yes 1. How often do you have a drink containing alcohol?: Never 2. How many drinks containing alcohol do you have on a typical day when you are drinking?: 1 or 2 3. How often do you have six or more drinks on one occasion?: Never AUDIT-C Score: 0 Substance Abuse History in the last 12 months:  No. Consequences of Substance Abuse: Negative Previous Psychotropic Medications: Yes  Psychological Evaluations: Yes  Past Medical History:  Past Medical History:  Diagnosis Date   Abscessed tooth 02/09/2020   Acute on chronic congestive heart failure ([place])    Adenomatous colon polyp    Arthritis    "real bad; all over" (07/12/2017)   Asthma    Atrial fibrillation ([place])    BIPOLAR DISORDER 08/31/2006   Qualifier:  Diagnosis of  By: Dorathy Daft MD, Marjory Lies     CHRONIC KIDNEY DISEASE STAGE II (MILD) 09/14/2009   Annotation: eGFR 86 Qualifier: Diagnosis of  By: Jess Barters MD, [name]     Chronic lower back pain    Congestive heart failure (CHF) (HCC)    COPD (chronic obstructive pulmonary disease) ([place]) 09/23/2010   Diagn[facility] (Dr. Annamaria Boots)    DM (diabetes mellitus) type II controlled with renal manifestation ([place]) 08/31/2006    Qualifier: Diagnosis of  By: Dorathy Daft MD, Marjory Lies     Dyspnea    "all my life; since 6th grade" (07/12/2017)   Fatty liver    HYPERCHOLESTEROLEMIA 08/31/2006   Intolerance to Lipitor OK on Crestor but medicaid no longer covering     HYPERTENSION, BENIGN SYSTEMIC 08/31/2006   Qualifier: Diagnosis of  By: Dorathy Daft MD, Aaron     Hypokalemia    HYPOTHYROIDISM, UNSPECIFIED 08/31/2006   Qualifier: Diagnosis of  By: Dorathy Daft MD, Marjory Lies     Leg swelling 03/14/2018   Pneumonia    "3 times" (07/12/2017)   Pulmonary nodule    Renal cyst    Schizophrenia ([place])    Scoliosis    Stomach problems    Thyroid disorder     Past Surgical History:  Procedure Laterality Date   CESAREAN SECTION     FOOT FRACTURE SURGERY Right    "steel plate in it"   FOOT SURGERY     " born w/dislocated foot"   FRACTURE SURGERY     KNEE ARTHROSCOPY Right    TOE SURGERY Bilateral    "both pinky toes"   TONSILLECTOMY AND ADENOIDECTOMY     Family History:  Family History  Problem Relation Age of Onset   Breast cancer Sister    Heart disease Mother 15   Rectal cancer Mother    Diabetes Father 73   Hypertension Father    Heart disease Brother    Asthma Daughter    Asthma Son    Breast cancer Sister    Asthma Daughter    Colon cancer Maternal Grandmother    Stomach cancer Neg Hx    Esophageal cancer Neg Hx    Pancreatic cancer Neg Hx    Family Psychiatric  History: Denies knowing of any Tobacco Screening:   Social History:  Social History   Substance and Sexual Activity  Alcohol Use Not Currently   Comment: 07/12/2017 "stopped 2 yr ago; did have a drink over the holidays recently"     Social History   Substance and Sexual Activity  Drug Use No    Additional Social History:                           Allergies:   Allergies  Allergen Reactions   Citrus Anaphylaxis and Itching   Fish Allergy Anaphylaxis    Cod   Shellfish Allergy Shortness Of Breath and Other (See Comments)    "Affects  thyroid" also   Adhesive [Tape] Other (See Comments)    Must have paper tape only   Ibuprofen Swelling    Face swells   Latex Dermatitis   Lipitor [Atorvastatin Calcium] Other (See Comments)    Body aches   Lisinopril Other (See Comments)    PER DR. Melvyn Novas (not recalled by patient)   Other Nausea Only and Other (See Comments)    Collards (gas, too)   Codeine Rash   Tramadol Palpitations   Lab Results:  Results  for orders placed or performed during the hospital encounter of 04/17/21 (from the past 48 hour(s))  Urine rapid drug screen (hosp performed)     Status: None   Collection Time: 04/17/21  7:30 PM  Result Value Ref Range   Opiates NONE DETECTED NONE DETECTED   Cocaine NONE DETECTED NONE DETECTED   Benzodiazepines NONE DETECTED NONE DETECTED   Amphetamines NONE DETECTED NONE DETECTED   Tetrahydrocannabinol NONE DETECTED NONE DETECTED   Barbiturates NONE DETECTED NONE DETECTED    Comment: (NOTE) DRUG SCREEN FOR MEDICAL PURPOSES ONLY.  IF CONFIRMATION IS NEEDED FOR ANY PURPOSE, NOTIFY LAB WITHIN 5 DAYS.  LOWEST DETECTABLE LIMITS FOR URINE DRUG SCREEN Drug Class                     Cutoff (ng/mL) Amphetamine and metabolites    1000 Barbiturate and metabolites    200 Benzodiazepine                 017 Tricyclics and metabolites     300 Opiates and metabolites        300 Cocaine and metabolites        300 THC                            50 Performed at [facility], [place] [address] [place], [place] [address]   Comprehensive metabolic panel     Status: Abnormal   Collection Time: 04/17/21  7:38 PM  Result Value Ref Range   Sodium 134 (L) 135 - 145 mmol/L   Potassium 2.4 (LL) 3.5 - 5.1 mmol/L    Comment: CRITICAL RESULT CALLED TO, READ BACK BY AND VERIFIED WITH: BRENDA MICHAELSON RN 04/17/21 2050 Wiliam Ke    Chloride 96 (L) 98 - 111 mmol/L   CO2 28 22 - 32 mmol/L   Glucose, Bld 176 (H) 70 - 99 mg/dL    Comment: Glucose reference range applies only to samples  taken after fasting for at least 8 hours.   BUN 25 (H) 8 - 23 mg/dL   Creatinine, Ser 1.64 (H) 0.44 - 1.00 mg/dL   Calcium 9.0 8.9 - 10.3 mg/dL   Total Protein 7.6 6.5 - 8.1 g/dL   Albumin 3.4 (L) 3.5 - 5.0 g/dL   AST 19 15 - 41 U/L   ALT 16 0 - 44 U/L   Alkaline Phosphatase 94 38 - 126 U/L   Total Bilirubin 0.5 0.3 - 1.2 mg/dL   GFR, Estimated 34 (L) >60 mL/min    Comment: (NOTE) Calculated using the CKD-EPI Creatinine Equation (2021)    Anion gap 10 5 - 15    Comment: Performed at [facility] [address], [place], [place] [address]  Ethanol     Status: None   Collection Time: 04/17/21  7:38 PM  Result Value Ref Range   Alcohol, Ethyl (B) <10 <10 mg/dL    Comment: (NOTE) Lowest detectable limit for serum alcohol is 10 mg/dL.  For medical purposes only. Performed at [facility], [place] [address], [place], [place] [address]   CBC with Diff     Status: Abnormal   Collection Time: 04/17/21  7:38 PM  Result Value Ref Range   WBC 6.3 4.0 - 10.5 K/uL   RBC 3.53 (L) 3.87 - 5.11 MIL/uL   Hemoglobin 11.2 (L) 12.0 - 15.0 g/dL   HCT 34.2 (L) 36.0 - 46.0 %  MCV 96.9 80.0 - 100.0 fL   MCH 31.7 26.0 - 34.0 pg   MCHC 32.7 30.0 - 36.0 g/dL   RDW 16.6 (H) 11.5 - 15.5 %   Platelets 249 150 - 400 K/uL   nRBC 0.0 0.0 - 0.2 %   Neutrophils Relative % 39 %   Neutro Abs 2.4 1.7 - 7.7 K/uL   Lymphocytes Relative 42 %   Lymphs Abs 2.7 0.7 - 4.0 K/uL   Monocytes Relative 13 %   Monocytes Absolute 0.8 0.1 - 1.0 K/uL   Eosinophils Relative 5 %   Eosinophils Absolute 0.3 0.0 - 0.5 K/uL   Basophils Relative 1 %   Basophils Absolute 0.0 0.0 - 0.1 K/uL   Immature Granulocytes 0 %   Abs Immature Granulocytes 0.01 0.00 - 0.07 K/uL    Comment: Performed at [facility] [address] [place], [place] [address]  Resp Panel by RT-PCR (Flu A&B, Covid) Nasopharyngeal Swab     Status: None   Collection Time: 04/18/21  4:01 AM   Specimen: Nasopharyngeal Swab; Nasopharyngeal(NP)  swabs in vial transport medium  Result Value Ref Range   SARS Coronavirus 2 by RT PCR NEGATIVE NEGATIVE    Comment: (NOTE) SARS-CoV-2 target nucleic acids are NOT DETECTED.  The SARS-CoV-2 RNA is generally detectable in upper respiratory specimens during the acute phase of infection. The lowest concentration of SARS-CoV-2 viral copies this assay can detect is 138 copies/mL. A negative result does not preclude SARS-Cov-2 infection and should not be used as the sole basis for treatment or other patient management decisions. A negative result may occur with  improper specimen collection/handling, submission of specimen other than nasopharyngeal swab, presence of viral mutation(s) within the areas targeted by this assay, and inadequate number of viral copies(<138 copies/mL). A negative result must be combined with clinical observations, patient history, and epidemiological information. The expected result is Negative.  Fact Sheet for Patients:  EntrepreneurPulse.com.au  Fact Sheet for Healthcare Providers:  IncredibleEmployment.be  This test is no t yet approved or cleared by the [place] FDA and  has been authorized for detection and/or diagnosis of SARS-CoV-2 by FDA under an Emergency Use Authorization (EUA). This EUA will remain  in effect (meaning this test can be used) for the duration of the COVID-19 declaration under Section 564(b)(1) of the Act, 21 U.S.C.section 360bbb-3(b)(1), unless the authorization is terminated  or revoked sooner.       Influenza A by PCR NEGATIVE NEGATIVE   Influenza B by PCR NEGATIVE NEGATIVE    Comment: (NOTE) The Xpert Xpress SARS-CoV-2/FLU/RSV plus assay is intended as an aid in the diagnosis of influenza from Nasopharyngeal swab specimens and should not be used as a sole basis for treatment. Nasal washings and aspirates are unacceptable for Xpert Xpress SARS-CoV-2/FLU/RSV testing.  Fact Sheet for  Patients: EntrepreneurPulse.com.au  Fact Sheet for Healthcare Providers: IncredibleEmployment.be  This test is not yet approved or cleared by the [place] FDA and has been authorized for detection and/or diagnosis of SARS-CoV-2 by FDA under an Emergency Use Authorization (EUA). This EUA will remain in effect (meaning this test can be used) for the duration of the COVID-19 declaration under Section 564(b)(1) of the Act, 21 U.S.C. section 360bbb-3(b)(1), unless the authorization is terminated or revoked.  Performed at [facility], [place] [address], [place], [place] [address]   Basic metabolic panel     Status: Abnormal   Collection Time: 04/18/21  7:00 AM  Result Value  Ref Range   Sodium 135 135 - 145 mmol/L   Potassium 3.0 (L) 3.5 - 5.1 mmol/L   Chloride 99 98 - 111 mmol/L   CO2 28 22 - 32 mmol/L   Glucose, Bld 179 (H) 70 - 99 mg/dL    Comment: Glucose reference range applies only to samples taken after fasting for at least 8 hours.   BUN 21 8 - 23 mg/dL   Creatinine, Ser 1.42 (H) 0.44 - 1.00 mg/dL   Calcium 9.2 8.9 - 10.3 mg/dL   GFR, Estimated 40 (L) >60 mL/min    Comment: (NOTE) Calculated using the CKD-EPI Creatinine Equation (2021)    Anion gap 8 5 - 15    Comment: Performed at [facility] [address] [place], [place] [address]  Basic metabolic panel     Status: Abnormal   Collection Time: 04/19/21 10:09 AM  Result Value Ref Range   Sodium 134 (L) 135 - 145 mmol/L   Potassium 3.3 (L) 3.5 - 5.1 mmol/L   Chloride 97 (L) 98 - 111 mmol/L   CO2 27 22 - 32 mmol/L   Glucose, Bld 149 (H) 70 - 99 mg/dL    Comment: Glucose reference range applies only to samples taken after fasting for at least 8 hours.   BUN 28 (H) 8 - 23 mg/dL   Creatinine, Ser 1.62 (H) 0.44 - 1.00 mg/dL   Calcium 9.8 8.9 - 10.3 mg/dL   GFR, Estimated 34 (L) >60 mL/min    Comment: (NOTE) Calculated using the CKD-EPI Creatinine Equation (2021)     Anion gap 10 5 - 15    Comment: Performed at [facility] [address] [place], [place] [address]   *Note: Due to a large number of results and/or encounters for the requested time period, some results have not been displayed. A complete set of results can be found in Results Review.    Blood Alcohol level:  Lab Results  Component Value Date   ETH <10 04/17/2021   ETH <10 05/19/5207    Metabolic Disorder Labs:  Lab Results  Component Value Date   HGBA1C 8.6 (H) 10/01/2020   MPG 200 10/01/2020   MPG 186 05/26/2020   No results found for: PROLACTIN Lab Results  Component Value Date   CHOL 259 (H) 10/01/2020   TRIG 163 (H) 10/01/2020   HDL 48 (L) 10/01/2020   CHOLHDL 5.4 (H) 10/01/2020   VLDL 26 02/02/2016   LDLCALC 180 (H) 10/01/2020   LDLCALC 98 05/26/2020    Current Medications: Current Facility-Administered Medications  Medication Dose Route Frequency Provider Last Rate Last Admin   acetaminophen (TYLENOL) tablet 650 mg  650 mg Oral Q4H PRN Kirk Sampley, Madie Reno, MD       acetaminophen (TYLENOL) tablet 650 mg  650 mg Oral Q6H PRN Zakhia Seres T, MD       albuterol (VENTOLIN HFA) 108 (90 Base) MCG/ACT inhaler 2 puff  2 puff Inhalation QID PRN Eupha Lobb, Madie Reno, MD       alum & mag hydroxide-simeth (MAALOX/MYLANTA) 200-200-20 MG/5ML suspension 30 mL  30 mL Oral Q4H PRN Kimbree Casanas, Madie Reno, MD       [START ON 04/20/2021] amLODipine (NORVASC) tablet 5 mg  5 mg Oral Daily Annalea Alguire, Madie Reno, MD       Derrill Memo ON 04/20/2021] hydrochlorothiazide (HYDRODIURIL) tablet 25 mg  25 mg Oral Daily Mulan Adan, Madie Reno, MD       Derrill Memo ON 04/20/2021] levothyroxine (SYNTHROID)  tablet 25 mcg  25 mcg Oral QAC breakfast Camree Wigington, Madie Reno, MD       [START ON 04/20/2021] linagliptin (TRADJENTA) tablet 5 mg  5 mg Oral Daily Sheddrick Lattanzio T, MD       magnesium hydroxide (MILK OF MAGNESIA) suspension 30 mL  30 mL Oral Daily PRN Saide Lanuza, Madie Reno, MD       metFORMIN (GLUCOPHAGE) tablet 1,000 mg  1,000 mg Oral  BID WC Aven Cegielski, Madie Reno, MD       Derrill Memo ON 04/20/2021] metoprolol succinate (TOPROL-XL) 24 hr tablet 50 mg  50 mg Oral Daily Jazz Biddy, Madie Reno, MD       Derrill Memo ON 04/20/2021] pantoprazole (PROTONIX) EC tablet 40 mg  40 mg Oral Daily Kaia Depaolis T, MD       potassium chloride (KLOR-CON) CR tablet 10 mEq  10 mEq Oral BID Sherria Riemann T, MD       ziprasidone (GEODON) capsule 20 mg  20 mg Oral BID WC Citlali Gautney, Madie Reno, MD       PTA Medications: Medications Prior to Admission  Medication Sig Dispense Refill Last Dose   albuterol (ACCUNEB) 1.25 MG/3ML nebulizer solution USE 1 VIAL VIA NEBULIZER EVERY 6 HOURS AS NEEDED FOR WHEEZING (Patient taking differently: Take 1 ampule by nebulization every 6 (six) hours as needed for shortness of breath or wheezing.) 75 mL 1    albuterol (VENTOLIN HFA) 108 (90 Base) MCG/ACT inhaler Inhale 2 puffs into the lungs 4 (four) times daily as needed for wheezing or shortness of breath. 18 g 1    amLODipine (NORVASC) 5 MG tablet Take 1 tablet (5 mg total) by mouth daily. 90 tablet 1    Blood Glucose Monitoring Suppl (FREESTYLE LITE) w/Device KIT 1 each by Does not apply route daily. Dx:E11.9 1 kit 0    Blood Pressure Monitoring (BLOOD PRESSURE MONITOR/WRIST) KIT 1 Units by Does not apply route daily at 6 (six) AM. 1 kit 0    conjugated estrogens (PREMARIN) vaginal cream Place 1 Applicatorful vaginally at bedtime. 42.5 g 3    dapagliflozin propanediol (FARXIGA) 5 MG TABS tablet Take 1 tablet (5 mg total) by mouth daily. (Patient taking differently: Take 5 mg by mouth daily. Daughter states she is no longer taking this) 30 tablet 1    Divalproex Sodium (DEPAKOTE PO) Take 750 mg by mouth at bedtime.   More than a month   glucose blood (ACCU-CHEK GUIDE) test strip USE TO TEST BLOOD SUGAR ONCE DAILY 100 strip 1    glucose blood (FREESTYLE LITE) test strip Use to test blood sugar one daily. Dx: E11.9 100 each 5    glucose blood (ONETOUCH VERIO) test strip Use to test blood sugar  2-3 times daily. Dx: E11.9 100 each 11    glucose blood test strip One Touch Ultra 2 Test Strips Use to test blood sugar once daily. Dx: E11.9 100 each 5    glucose blood test strip Use 2-3 times daily 100 each 12    hydrochlorothiazide (HYDRODIURIL) 25 MG tablet TAKE 1 TABLET(25 MG) BY MOUTH DAILY (Patient taking differently: Take 25 mg by mouth daily.) 30 tablet 3    ibuprofen (ADVIL) 800 MG tablet TAKE 1 TABLET(800 MG) BY MOUTH EVERY 8 HOURS AS NEEDED (Patient taking differently: Take 800 mg by mouth every 8 (eight) hours as needed for headache or moderate pain.) 30 tablet 0    Lancets (FREESTYLE) lancets Use to check blood sugar once daily. Dx: E11.9 100 each 5  levothyroxine (SYNTHROID) 25 MCG tablet TAKE 1 TABLET(25 MCG) BY MOUTH DAILY BEFORE BREAKFAST (Patient taking differently: Take 25 mcg by mouth daily before breakfast.) 30 tablet 3    LORazepam (ATIVAN) 0.5 MG tablet Take 0.5 mg by mouth at bedtime.   More than a month   metFORMIN (GLUCOPHAGE) 1000 MG tablet Take 1 tablet (1,000 mg total) by mouth 2 (two) times daily with a meal. 90 tablet 1    methocarbamol (ROBAXIN) 500 MG tablet Take 1 tablet (500 mg total) by mouth every 6 (six) hours as needed for muscle spasms. 30 tablet 0    metoprolol succinate (TOPROL-XL) 50 MG 24 hr tablet Take 1 tablet (50 mg total) by mouth daily. Take with or immediately following a meal. 30 tablet 3    omeprazole (PRILOSEC) 40 MG capsule Take 1 capsule (40 mg total) by mouth 2 (two) times daily. 60 capsule 1    penicillin v potassium (VEETID) 500 MG tablet Take 1 tablet (500 mg total) by mouth 4 (four) times daily. 120 tablet 0    potassium chloride (KLOR-CON) 10 MEQ tablet Take 1 tablet (10 mEq total) by mouth 2 (two) times daily. 60 tablet 3    potassium chloride SA (KLOR-CON) 20 MEQ tablet Take 2 tablets (40 mEq total) by mouth once for 1 dose. 2 tablet 0    rosuvastatin (CRESTOR) 40 MG tablet Take 1 tablet (40 mg total) by mouth daily. 90 tablet 3     sitaGLIPtin (JANUVIA) 50 MG tablet Take 1 tablet (50 mg total) by mouth daily. 90 tablet 1    sodium chloride 0.9 % infusion Inject into the vein once. 10 ml/hr      sucralfate (CARAFATE) 1 GM/10ML suspension SHAKE LIQUID AND TAKE 10 ML(1 GRAM) BY MOUTH FOUR TIMES DAILY (Patient taking differently: Take 1 g by mouth 4 (four) times daily.) 1200 mL 0     Musculoskeletal: Strength & Muscle Tone: within normal limits Gait & Station: normal Patient leans: N/A            Psychiatric Specialty Exam:  Presentation  General Appearance:  No data recorded Eye Contact: No data recorded Speech: No data recorded Speech Volume: No data recorded Handedness: No data recorded  Mood and Affect  Mood: No data recorded Affect: No data recorded  Thought Process  Thought Processes: No data recorded Duration of Psychotic Symptoms: Greater than six months  Past Diagnosis of Schizophrenia or Psychoactive disorder: Yes  Descriptions of Associations:No data recorded Orientation:No data recorded Thought Content:No data recorded Hallucinations:No data recorded Ideas of Reference:No data recorded Suicidal Thoughts:No data recorded Homicidal Thoughts:No data recorded  Sensorium  Memory: No data recorded Judgment: No data recorded Insight: No data recorded  Executive Functions  Concentration: No data recorded Attention Span: No data recorded Recall: No data recorded Fund of Knowledge: No data recorded Language: No data recorded  Psychomotor Activity  Psychomotor Activity: No data recorded  Assets  Assets: No data recorded  Sleep  Sleep: No data recorded   Physical Exam: Physical Exam Vitals and nursing note reviewed.  Constitutional:      Appearance: Normal appearance.  HENT:     Head: Normocephalic and atraumatic.     Mouth/Throat:     Pharynx: Oropharynx is clear.  Eyes:     Pupils: Pupils are equal, round, and reactive to light.  Cardiovascular:      Rate and Rhythm: Normal rate and regular rhythm.  Pulmonary:     Effort: Pulmonary effort is normal.  Breath sounds: Normal breath sounds.  Abdominal:     General: Abdomen is flat.     Palpations: Abdomen is soft.  Musculoskeletal:        General: Normal range of motion.  Skin:    General: Skin is warm and dry.  Neurological:     General: No focal deficit present.     Mental Status: She is alert. Mental status is at baseline.  Psychiatric:        Attention and Perception: She is inattentive.        Mood and Affect: Mood normal. Affect is blunt and inappropriate.        Speech: Speech is tangential.        Behavior: Behavior is agitated. Behavior is not aggressive.        Thought Content: Thought content is paranoid and delusional.        Cognition and Memory: Memory is impaired.        Judgment: Judgment is impulsive and inappropriate.   Review of Systems  Constitutional: Negative.   HENT: Negative.    Eyes: Negative.   Respiratory: Negative.    Cardiovascular: Negative.   Gastrointestinal: Negative.   Musculoskeletal: Negative.   Skin: Negative.   Neurological: Negative.   Psychiatric/Behavioral: Negative.    Blood pressure 125/69, temperature (!) 97.3 F (36.3 C), temperature source Oral, resp. rate 16. There is no height or weight on file to calculate BMI.  Treatment Plan Summary: Daily contact with patient to assess and evaluate symptoms and progress in treatment, Medication management, and Plan tried to get the patient to engage in some conversation about what medication she would agree to.  Listed multiple medicines.  Suggested starting at very low doses.  She was adamant that she would not take any psychiatric medicine.  I made it clear to her that I think that she has an illness and she is not going to be able to function outside the hospital without treatment but she disagrees with that.  Patient will remain on 15-minute checks.  To the extent that the geriatric  ward is getting organized we will engage her in the therapeutic programming we have.  I will contact her daughter tomorrow.  We will possibly need to resort to forced medicines if the patient remains adamant about not taking anything.  I have ordered additional lab tests including TSH hemoglobin A1c and lipid panel.  Observation Level/Precautions:  15 minute checks  Laboratory:  HbAIC  Psychotherapy:    Medications:    Consultations:    Discharge Concerns:    Estimated LOS:  Other:     Physician Treatment Plan for Primary Diagnosis: Schizoaffective disorder ([place]) Long Term Goal(s): Improvement in symptoms so as ready for discharge  Short Term Goals: Ability to demonstrate self-control will improve and Ability to identify and develop effective coping behaviors will improve  Physician Treatment Plan for Secondary Diagnosis: Principal Problem:   Schizoaffective disorder ([place]) Active Problems:   Hypothyroidism   Type 2 diabetes mellitus ([place])   HYPERCHOLESTEROLEMIA   HYPERTENSION, BENIGN SYSTEMIC   Hypokalemia   Atrial fibrillation ([place])  Long Term Goal(s): Improvement in symptoms so as ready for discharge  Short Term Goals: Compliance with prescribed medications will improve  I certify that inpatient services furnished can reasonably be expected to improve the patient's condition.    Alethia Berthold, MD 10/17/20226:56 PM

## 2021-04-19 NOTE — ED Notes (Signed)
Sheriff arrives for transport to [facility], pt very agitated cursing at staff but did walk out with [employer] without issues. Pt's daughter has all belongings. RN called daughter [name] to inform her of transfer as daughter is pt guardian.

## 2021-04-19 NOTE — ED Notes (Signed)
Pt sitting up in chair at bedside in hallway. Sitter at bedside. Pt ambulated to bathroom and back without difficulty. Body language is guarded, conversation is limited and defensive to RN. Pt is suspicious and becomes irritable when asked any questions, but remains cooperative and noncombative. To continue to monitor.

## 2021-04-19 NOTE — ED Notes (Signed)
Sheriff called to transport patient

## 2021-04-19 NOTE — ED Notes (Signed)
[name] Long daughter [phone] requesting an update

## 2021-04-19 NOTE — Progress Notes (Signed)
Patient is calm and cooperative. Pt is observed speaking to her children on the phone. Pt is observed interacting appropriately with others on the unit. Pt is medication compliant. Pt stated she had left shoulder pain rated 10/10. Pt denies SI, HI, and AVH at this time. Pt is on a carb-modified diet and is a type 2 diabetic but insists to have many snacks and sugary drinks. Limits set with what snacks she can have. Pt remains safe on the unit at this time.

## 2021-04-19 NOTE — Consult Note (Signed)
  Unable to complete psychiatric reassessment.  Patient refused to participate in assessment.   Psychotropics have been started.  Continue to recommend inpatient psychiatric treatment.     Slevin Gunby B. Euphemia Lingerfelt, NP
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
## 2021-04-19 NOTE — Plan of Care (Signed)
Patient admitted to [facility] IVC from [facility] ED due to non-compliance with prescribed medications for schizophrenia diagnosis. Patient presents to unit alert and ambulatory stating, "I don't know what you all want with me..just leave me alone." Refused skin check, weight, assessment and uncooperative with most of admission questions. Patient's affect is agitated and thoughts are disorganized.  Patient currently denies suicidal ideations, homicidal ideations, audio or visual hallucinations and verbally contracts for safety on unit.  Patient states she lives alone and that daughter is main support system. Patient denies smoking or ETOH use.   Patient oriented to room, unit and call light.  Patient denies needs and remains safe on the unit at this time.  Phone call to daughter, [name], to update on pt status and complete admission assessment.  Will continue to monitor patient Q 15 min for safety checks.  Problem: Education: Goal: Knowledge of General Education information will improve Description: Including pain rating scale, medication(s)/side effects and non-pharmacologic comfort measures Outcome: Not Progressing   Problem: Health Behavior/Discharge Planning: Goal: Ability to manage health-related needs will improve Outcome: Not Progressing   Problem: Activity: Goal: Risk for activity intolerance will decrease Outcome: Not Progressing   Problem: Coping: Goal: Level of anxiety will decrease Outcome: Not Progressing   Problem: Pain Managment: Goal: General experience of comfort will improve Outcome: Not Progressing   Problem: Safety: Goal: Ability to remain free from injury will improve Outcome: Not Progressing   Problem: Skin Integrity: Goal: Risk for impaired skin integrity will decrease Outcome: Not Progressing

## 2021-04-19 NOTE — ED Notes (Signed)
Pt requesting ibuprofen and this RN educated the pt that there is a listed allergy to ibuprofen in the chart that is associated with  face swelling and we would not be able to give it to her. Pt told this rn to "worry about your self and I will worry about me" This RN let the pt know that we substituted the ibuprofen with the ordered tylenol. Pt continued to be agitated and kept repeating to talk to her regular MD. Pt was educated that the chart should be available to her PCP with her permission.

## 2021-04-19 NOTE — BHH Suicide Risk Assessment (Signed)
[facility] Admission Suicide Risk Assessment   Nursing information obtained from:    Demographic factors:    Current Mental Status:    Loss Factors:    Historical Factors:    Risk Reduction Factors:     Total Time spent with patient: 1 hour Principal Problem: Schizoaffective disorder ([place]) Diagnosis:  Principal Problem:   Schizoaffective disorder ([place]) Active Problems:   Hypothyroidism   Type 2 diabetes mellitus (HCC)   HYPERCHOLESTEROLEMIA   HYPERTENSION, BENIGN SYSTEMIC   Hypokalemia   Atrial fibrillation (HCC)  Subjective Data: Patient seen and chart reviewed.  70 year old woman with a history of schizoaffective disorder.  Patient transferred from [facility] where she had been in the emergency room stabilizing medical problems as well as ongoing psychotic symptoms.  On interview the patient is clearly disorganized in her thinking.  Responding to internal stimuli.  Appears to be talking as though she were on the telephone when by herself.  Paranoid rambling speech.  Denies any suicidal or homicidal thought.  No report of any self-injury recently.  Continued Clinical Symptoms:    The "Alcohol Use Disorders Identification Test", Guidelines for Use in Primary Care, Second Edition.  World [facility]). Score between 0-7:  no or low risk or alcohol related problems. Score between 8-15:  moderate risk of alcohol related problems. Score between 16-19:  high risk of alcohol related problems. Score 20 or above:  warrants further diagnostic evaluation for alcohol dependence and treatment.   CLINICAL FACTORS:   Bipolar Disorder:   Mixed State Schizophrenia:   Paranoid or undifferentiated type   Musculoskeletal: Strength & Muscle Tone: within normal limits Gait & Station: normal Patient leans: N/A  Psychiatric Specialty Exam:  Presentation  General Appearance:  No data recorded Eye Contact: No data recorded Speech: No data recorded Speech Volume: No data  recorded Handedness: No data recorded  Mood and Affect  Mood: No data recorded Affect: No data recorded  Thought Process  Thought Processes: No data recorded Descriptions of Associations:No data recorded Orientation:No data recorded Thought Content:No data recorded History of Schizophrenia/Schizoaffective disorder:Yes  Duration of Psychotic Symptoms:Greater than six months  Hallucinations:No data recorded Ideas of Reference:No data recorded Suicidal Thoughts:No data recorded Homicidal Thoughts:No data recorded  Sensorium  Memory: No data recorded Judgment: No data recorded Insight: No data recorded  Executive Functions  Concentration: No data recorded Attention Span: No data recorded Recall: No data recorded Fund of Knowledge: No data recorded Language: No data recorded  Psychomotor Activity  Psychomotor Activity: No data recorded  Assets  Assets: No data recorded  Sleep  Sleep: No data recorded   Physical Exam: Physical Exam Vitals and nursing note reviewed.  Constitutional:      Appearance: Normal appearance.  HENT:     Head: Normocephalic and atraumatic.     Mouth/Throat:     Pharynx: Oropharynx is clear.  Eyes:     Pupils: Pupils are equal, round, and reactive to light.  Cardiovascular:     Rate and Rhythm: Normal rate and regular rhythm.  Pulmonary:     Effort: Pulmonary effort is normal.     Breath sounds: Normal breath sounds.  Abdominal:     General: Abdomen is flat.     Palpations: Abdomen is soft.  Musculoskeletal:        General: Normal range of motion.  Skin:    General: Skin is warm and dry.  Neurological:     General: No focal deficit present.     Mental Status:  She is alert. Mental status is at baseline.  Psychiatric:        Attention and Perception: She is inattentive. She perceives auditory hallucinations.        Mood and Affect: Mood normal. Affect is inappropriate.        Speech: Speech is tangential.         Behavior: Behavior is agitated. Behavior is not aggressive.        Thought Content: Thought content is paranoid and delusional. Thought content does not include homicidal or suicidal ideation.        Cognition and Memory: Memory is impaired.        Judgment: Judgment is inappropriate.   Review of Systems  Constitutional: Negative.   HENT: Negative.    Eyes: Negative.   Respiratory: Negative.    Cardiovascular: Negative.   Gastrointestinal: Negative.   Musculoskeletal: Negative.   Skin: Negative.   Neurological: Negative.   Psychiatric/Behavioral: Negative.    Blood pressure 125/69, temperature (!) 97.3 F (36.3 C), temperature source Oral, resp. rate 16. There is no height or weight on file to calculate BMI.   COGNITIVE FEATURES THAT CONTRIBUTE TO RISK:  Closed-mindedness    SUICIDE RISK:   Minimal: No identifiable suicidal ideation.  Patients presenting with no risk factors but with morbid ruminations; may be classified as minimal risk based on the severity of the depressive symptoms  PLAN OF CARE: Patient will be put back on her usual medical medications.  Labs reviewed.  Engaged in individual and group therapy on the milieu.  Continue 15-minute checks.  Try to improve rapport get her onto some medication.  Ongoing reassessment of dangerousness prior to discharge  I certify that inpatient services furnished can reasonably be expected to improve the patient's condition.   Alethia Berthold, MD 04/19/2021, 6:53 PM
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
## 2021-04-20 ENCOUNTER — Telehealth: Payer: Self-pay | Admitting: Orthopedic Surgery

## 2021-04-20 MED ORDER — RISPERIDONE 0.5 MG PO TBDP
1.0000 mg | ORAL_TABLET | Freq: Two times a day (BID) | ORAL | Status: DC
  Administered 2021-04-21 – 2021-04-26 (×11): 1 mg via ORAL
  Filled 2021-04-20 (×2): qty 1
  Filled 2021-04-20: qty 2
  Filled 2021-04-20: qty 1
  Filled 2021-04-20 (×2): qty 2
  Filled 2021-04-20: qty 1
  Filled 2021-04-20 (×2): qty 2
  Filled 2021-04-20 (×3): qty 1
  Filled 2021-04-20: qty 2
  Filled 2021-04-20 (×2): qty 1
  Filled 2021-04-20: qty 2

## 2021-04-20 MED ORDER — DIVALPROEX SODIUM 250 MG PO DR TAB
250.0000 mg | DELAYED_RELEASE_TABLET | Freq: Two times a day (BID) | ORAL | Status: DC
  Administered 2021-04-21 – 2021-04-23 (×5): 250 mg via ORAL
  Filled 2021-04-20 (×8): qty 1

## 2021-04-20 MED ORDER — OLANZAPINE 10 MG IM SOLR
5.0000 mg | Freq: Two times a day (BID) | INTRAMUSCULAR | Status: DC | PRN
  Administered 2021-04-20: 5 mg via INTRAMUSCULAR
  Filled 2021-04-20 (×2): qty 10

## 2021-04-20 NOTE — Telephone Encounter (Signed)
Pt called and states that she was seen at [facility] and then she got taken to a mental health place in [place].She said maybe its called [facility]  She states she is the first person that has ever been there and that the nurses and refusing to give her her medications. She wants Dr.Duda to call her whenever he gets a chance. She doesn't have a call back number, she said to just look it up.
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
## 2021-04-20 NOTE — BHH Suicide Risk Assessment (Addendum)
[place] INPATIENT:  Family/Significant Other Suicide Prevention Education Suicide Prevention Education:  Education Completed; [name] Long,daughter/guardian  (name of family member/significant other) has been identified by the patient as the family member/significant other with whom the patient will be residing, and identified as the person(s) who will aid the patient in the event of a mental health crisis (suicidal ideations/suicide attempt).  With written consent from the patient, the family member/significant other has been provided the following suicide prevention education, prior to the and/or following the discharge of the patient.  The suicide prevention education provided includes the following: Suicide risk factors Suicide prevention and interventions National Suicide Hotline telephone number [facility] assessment telephone number [facility] Emergency Assistance [facility] and/or Residential Mobile Crisis Unit telephone number  Request made of family/significant other to: Remove weapons (e.g., guns, rifles, knives), all items previously/currently identified as safety concern.   Remove drugs/medications (over-the-counter, prescriptions, illicit drugs), all items previously/currently identified as a safety concern.  The family member/significant other verbalizes understanding of the suicide prevention education information provided.  The family member/significant other agrees to remove the items of safety concern listed above.    [name] Long, MSW, LCSW-A 10/20/202211:45 AM

## 2021-04-20 NOTE — Telephone Encounter (Signed)
I see patient went to the ED.

## 2021-04-20 NOTE — Plan of Care (Signed)
Pt continues to deny need for inpatient stay.  [name] Long refused her a.m. medications.  Staff contacted her guardian in an attempt to verbally reinforce the need to take her medications.  Pt became verbally aggressive with family on the phone.  Staff was able to verbally redirect patient w/o the need for PRN medication.  Staff has continued to educate patient on medication necessity and compliance. [name] Long will take her medical medicines w/o complaint however stated to staff "I'm not taking those other pills, y'all the ones that need a psychiatrists"  Cont to monitor for the remainder of this shift for any additional changes in behaviors and for continued safety

## 2021-04-20 NOTE — Progress Notes (Signed)
Patient appeared agitated when I first saw her. I asked pt to take her medications for the night and she refused. Pt was suspicious of other staff members and told them to get out of her room. Pt agreed to taking the Zyprexa IM 5 mg injection which was administered without incident. Pt wants to speak with the doctor about going home. At times it is difficult to understand what pt is saying. Pt speech is tangential and disorganized at times. Pt denies SI, HI, and AVH at this time. Pt was given clothing brought from home that is appropriate on the unit. Pt's son brought belongings during his visit tonight.   Pt stated she is seeing shadows of people who are not in the room with her after taking the Zyprexa injection.   Pt remains safe on the unit at this time.
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
## 2021-04-20 NOTE — Telephone Encounter (Signed)
All patient's medications sent in on 04/07/2021 with refills

## 2021-04-20 NOTE — Progress Notes (Signed)
[facility] Second Physician Opinion Progress Note for Medication Administration to Non-consenting Patients (For Involuntarily Committed Patients)  Patient: [name] Long Date of Birth: [id number] MRN: [id number]  Reason for the Medication: The patient, without the benefit of the specific treatment measure, is incapable of participating in any available treatment plan that will give the patient a realistic opportunity of improving the patient's condition.  Consideration of Side Effects: Consideration of the side effects related to the medication plan has been given.  Rationale for Medication Administration: [name] Long has a long standing history of schizoaffective disorder, and is currently psychotic. She is unlikely to improve without appropriate medications. Without treatment she is also a danger to others.     [name] Scarlet, MD 04/20/21  2:24 PM   This documentation is good for (7) seven days from the date of the MD signature. New documentation must be completed every seven (7) days with detailed justification in the medical record if the patient requires continued non-emergent administration of psychotropic medications.

## 2021-04-20 NOTE — Telephone Encounter (Signed)
Tried calling Cassandra (POA) no answer and mailbox full unable to leave message.

## 2021-04-20 NOTE — Progress Notes (Signed)
Pt guardian called and spoke to staff.  She stated that her mother has been inpatient prior and has a hx of non-medication compliance.  Guardian stated that she as well has her siblings will speak to her mother to encourage PO medications.  Will cont to monitor pt

## 2021-04-20 NOTE — Progress Notes (Signed)
Staff attempted to call pt guardian Cassandra, per prior note guardian requested to be contacted about any new or any medication changes.  No answer, unable to L.M. as voice mailbox is full.  Will attempt again later in the day

## 2021-04-20 NOTE — Progress Notes (Signed)
[facility] MD Progress Note  04/20/2021 2:03 PM [name] Long  MRN:  [id number] Subjective: Follow-up for this 70 year old woman with a history of schizoaffective disorder and several medical problems.  Spoke with the patient's daughter today confirming that the patient has been off medicine at home and has been getting increasingly agitated paranoid and at times violently threatening towards family members.  Patient on the unit has refused to take psychiatric medicine.  Will only take medicine for other medical problems.  Spoke with her today.  Her affect was irritable throughout increasingly angry as we talked.  Speech was rapid.  Patient was disorganized and tangential and has been documented by nursing staff to continue to be grandiose paranoid and showed no insight.  Patient is complaining to me that she has pain "everywhere" in her body.  She says this is a longstanding issue and that she normally takes ibuprofen for it. Principal Problem: Schizoaffective disorder ([place]) Diagnosis: Principal Problem:   Schizoaffective disorder ([place]) Active Problems:   Hypothyroidism   Type 2 diabetes mellitus (HCC)   HYPERCHOLESTEROLEMIA   HYPERTENSION, BENIGN SYSTEMIC   Hypokalemia   Atrial fibrillation (HCC)  Total Time spent with patient: 30 minutes  Past Psychiatric History: History of schizoaffective disorder with documented prior hospitalizations here and at other facilities.  Speaking to daughter it sounds like not too long ago she had a hospitalization at another facility and was discharged improved but declined afterwards because of refusal to take medicine.  Daughter is a little bit confused saying that she was under the impression they wanted her to give her mother a shot of Depakote.  I suggested it might be more likely that this was a long-acting antipsychotic instead but she did not have the jars with her to check.  In any case patient consistently refused medicine.  Past Medical History:  Past  Medical History:  Diagnosis Date   Abscessed tooth 02/09/2020   Acute on chronic congestive heart failure ([place])    Adenomatous colon polyp    Arthritis    "real bad; all over" (07/12/2017)   Asthma    Atrial fibrillation ([place])    BIPOLAR DISORDER 08/31/2006   Qualifier: Diagnosis of  By: Dorathy Daft MD, Marjory Lies     CHRONIC KIDNEY DISEASE STAGE II (MILD) 09/14/2009   Annotation: eGFR 89 Qualifier: Diagnosis of  By: Jess Barters MD, [name]     Chronic lower back pain    Congestive heart failure (CHF) ([place])    COPD (chronic obstructive pulmonary disease) ([place]) 09/23/2010   Diagnosed at [facility] in 2008 (Dr. Annamaria Boots)    DM (diabetes mellitus) type II controlled with renal manifestation ([place]) 08/31/2006   Qualifier: Diagnosis of  By: Dorathy Daft MD, Marjory Lies     Dyspnea    "all my life; since 6th grade" (07/12/2017)   Fatty liver    HYPERCHOLESTEROLEMIA 08/31/2006   Intolerance to Lipitor OK on Crestor but medicaid no longer covering     HYPERTENSION, BENIGN SYSTEMIC 08/31/2006   Qualifier: Diagnosis of  By: Dorathy Daft MD, Aaron     Hypokalemia    HYPOTHYROIDISM, UNSPECIFIED 08/31/2006   Qualifier: Diagnosis of  By: Dorathy Daft MD, Marjory Lies     Leg swelling 03/14/2018   Pneumonia    "3 times" (07/12/2017)   Pulmonary nodule    Renal cyst    Schizophrenia ([place])    Scoliosis    Stomach problems    Thyroid disorder     Past Surgical History:  Procedure Laterality Date   CESAREAN SECTION  FOOT FRACTURE SURGERY Right    "steel plate in it"   FOOT SURGERY     " born w/dislocated foot"   FRACTURE SURGERY     KNEE ARTHROSCOPY Right    TOE SURGERY Bilateral    "both pinky toes"   TONSILLECTOMY AND ADENOIDECTOMY     Family History:  Family History  Problem Relation Age of Onset   Breast cancer Sister    Heart disease Mother 30   Rectal cancer Mother    Diabetes Father 63   Hypertension Father    Heart disease Brother    Asthma Daughter    Asthma Son    Breast cancer Sister    Asthma Daughter    Colon  cancer Maternal Grandmother    Stomach cancer Neg Hx    Esophageal cancer Neg Hx    Pancreatic cancer Neg Hx    Family Psychiatric  History: See previous Social History:  Social History   Substance and Sexual Activity  Alcohol Use Not Currently   Comment: 07/12/2017 "stopped 2 yr ago; did have a drink over the holidays recently"     Social History   Substance and Sexual Activity  Drug Use No    Social History   Socioeconomic History   Marital status: Divorced    Spouse name: Not on file   Number of children: 4   Years of education: 12   Highest education level: High school graduate  Occupational History   Not on file  Tobacco Use   Smoking status: Former    Packs/day: 0.25    Years: 45.00    Pack years: 11.25    Types: Cigarettes   Smokeless tobacco: Never   Tobacco comments:    patient states she smokes 1 cigarette per month  Vaping Use   Vaping Use: Never used  Substance and Sexual Activity   Alcohol use: Not Currently    Comment: 07/12/2017 "stopped 2 yr ago; did have a drink over the holidays recently"   Drug use: No   Sexual activity: Not Currently  Other Topics Concern   Not on file  Social History Narrative   Patient lives alone in [place].    Patient does not drive, she uses insurance transportation or her children.   Patient enjoys twirling her paton, listening to music, dancing, and spending time with family.    Patient enjoys celebrating all holidays and birthdays of loved ones.   Social Determinants of Health   Financial Resource Strain: Not on file  Food Insecurity: Not on file  Transportation Needs: Not on file  Physical Activity: Not on file  Stress: Not on file  Social Connections: Not on file   Additional Social History:                         Sleep: Fair  Appetite:  Fair  Current Medications: Current Facility-Administered Medications  Medication Dose Route Frequency Provider Last Rate Last Admin   acetaminophen  (TYLENOL) tablet 650 mg  650 mg Oral Q4H PRN Ebonye Reade, Madie Reno, MD       acetaminophen (TYLENOL) tablet 650 mg  650 mg Oral Q6H PRN Kharter Sestak T, MD   650 mg at 04/20/21 0630   albuterol (VENTOLIN HFA) 108 (90 Base) MCG/ACT inhaler 2 puff  2 puff Inhalation QID PRN Burnis Kaser, Madie Reno, MD       alum & mag hydroxide-simeth (MAALOX/MYLANTA) 200-200-20 MG/5ML suspension 30 mL  30 mL Oral Q4H PRN  Ashland Osmer, Madie Reno, MD       amLODipine (NORVASC) tablet 5 mg  5 mg Oral Daily Letticia Bhattacharyya T, MD   5 mg at 04/20/21 4580   divalproex (DEPAKOTE) DR tablet 250 mg  250 mg Oral Q12H Rai Severns T, MD       hydrochlorothiazide (HYDRODIURIL) tablet 25 mg  25 mg Oral Daily Adam Demary, Madie Reno, MD   25 mg at 04/20/21 9983   levothyroxine (SYNTHROID) tablet 25 mcg  25 mcg Oral QAC breakfast Ashaya Raftery T, MD   25 mcg at 04/20/21 0631   linagliptin (TRADJENTA) tablet 5 mg  5 mg Oral Daily Tashonna Descoteaux T, MD   5 mg at 04/20/21 3825   magnesium hydroxide (MILK OF MAGNESIA) suspension 30 mL  30 mL Oral Daily PRN Dominyk Law T, MD       metFORMIN (GLUCOPHAGE) tablet 1,000 mg  1,000 mg Oral BID WC Florene Brill T, MD   1,000 mg at 04/20/21 0539   metoprolol succinate (TOPROL-XL) 24 hr tablet 50 mg  50 mg Oral Daily Shaune Malacara T, MD   50 mg at 04/20/21 7673   pantoprazole (PROTONIX) EC tablet 40 mg  40 mg Oral Daily Samari Gorby T, MD   40 mg at 04/20/21 4193   potassium chloride (KLOR-CON) CR tablet 10 mEq  10 mEq Oral BID Tandra Rosado T, MD   10 mEq at 04/20/21 7902   risperiDONE (RISPERDAL M-TABS) disintegrating tablet 1 mg  1 mg Oral BID Makinsey Pepitone, Madie Reno, MD        Lab Results:  Results for orders placed or performed during the hospital encounter of 04/19/21 (from the past 48 hour(s))  TSH     Status: None   Collection Time: 04/19/21  7:33 PM  Result Value Ref Range   TSH 1.615 0.350 - 4.500 uIU/mL    Comment: Performed by a 3rd Generation assay with a functional sensitivity of <=0.01 uIU/mL. Performed at  [facility], [address] [place], [place] [address]   Lipid panel     Status: Abnormal   Collection Time: 04/19/21  7:33 PM  Result Value Ref Range   Cholesterol 166 0 - 200 mg/dL   Triglycerides 213 (H) <150 mg/dL   HDL 43 >40 mg/dL   Total CHOL/HDL Ratio 3.9 RATIO   VLDL 43 (H) 0 - 40 mg/dL   LDL Cholesterol 80 0 - 99 mg/dL    Comment:        Total Cholesterol/HDL:CHD Risk Coronary Heart Disease Risk Table                     Men   Women  1/2 Average Risk   3.4   3.3  Average Risk       5.0   4.4  2 X Average Risk   9.6   7.1  3 X Average Risk  23.4   11.0        Use the calculated Patient Ratio above and the CHD Risk Table to determine the patient's CHD Risk.        ATP III CLASSIFICATION (LDL):  <100     mg/dL   Optimal  100-129  mg/dL   Near or Above                    Optimal  130-159  mg/dL   Borderline  160-189  mg/dL   High  >190     mg/dL  Very High Performed at [facility], [address] [place], [place] [address]    *Note: Due to a large number of results and/or encounters for the requested time period, some results have not been displayed. A complete set of results can be found in Results Review.    Blood Alcohol level:  Lab Results  Component Value Date   ETH <10 04/17/2021   ETH <10 81/82/9937    Metabolic Disorder Labs: Lab Results  Component Value Date   HGBA1C 8.6 (H) 10/01/2020   MPG 200 10/01/2020   MPG 186 05/26/2020   No results found for: PROLACTIN Lab Results  Component Value Date   CHOL 166 04/19/2021   TRIG 213 (H) 04/19/2021   HDL 43 04/19/2021   CHOLHDL 3.9 04/19/2021   VLDL 43 (H) 04/19/2021   LDLCALC 80 04/19/2021   LDLCALC 180 (H) 10/01/2020    Physical Findings: AIMS:  , ,  ,  ,    CIWA:    COWS:     Musculoskeletal: Strength & Muscle Tone: within normal limits Gait & Station: normal Patient leans: N/A  Psychiatric Specialty Exam:  Presentation  General Appearance:  No data  recorded Eye Contact: No data recorded Speech: No data recorded Speech Volume: No data recorded Handedness: No data recorded  Mood and Affect  Mood: No data recorded Affect: No data recorded  Thought Process  Thought Processes: No data recorded Descriptions of Associations:No data recorded Orientation:No data recorded Thought Content:No data recorded History of Schizophrenia/Schizoaffective disorder:Yes  Duration of Psychotic Symptoms:Greater than six months  Hallucinations:No data recorded Ideas of Reference:No data recorded Suicidal Thoughts:No data recorded Homicidal Thoughts:No data recorded  Sensorium  Memory: No data recorded Judgment: No data recorded Insight: No data recorded  Executive Functions  Concentration: No data recorded Attention Span: No data recorded Recall: No data recorded Fund of Knowledge: No data recorded Language: No data recorded  Psychomotor Activity  Psychomotor Activity: No data recorded  Assets  Assets: No data recorded  Sleep  Sleep: No data recorded   Physical Exam: Physical Exam Vitals and nursing note reviewed.  Constitutional:      Appearance: Normal appearance.  HENT:     Head: Normocephalic and atraumatic.     Mouth/Throat:     Pharynx: Oropharynx is clear.  Eyes:     Pupils: Pupils are equal, round, and reactive to light.  Cardiovascular:     Rate and Rhythm: Normal rate and regular rhythm.  Pulmonary:     Effort: Pulmonary effort is normal.     Breath sounds: Normal breath sounds.  Abdominal:     General: Abdomen is flat.     Palpations: Abdomen is soft.  Musculoskeletal:        General: Normal range of motion.  Skin:    General: Skin is warm and dry.  Neurological:     General: No focal deficit present.     Mental Status: She is alert. Mental status is at baseline.  Psychiatric:        Attention and Perception: She is inattentive.        Mood and Affect: Mood normal. Affect is  inappropriate.        Speech: Speech is rapid and pressured and tangential.        Behavior: Behavior is agitated. Behavior is not aggressive.        Thought Content: Thought content is paranoid and delusional. Thought content does not include homicidal or suicidal ideation.  Cognition and Memory: Cognition is impaired.        Judgment: Judgment is inappropriate.   Review of Systems  Constitutional: Negative.   HENT: Negative.    Eyes: Negative.   Respiratory: Negative.    Cardiovascular: Negative.   Gastrointestinal: Negative.   Musculoskeletal:  Positive for myalgias.  Skin: Negative.   Neurological: Negative.   Psychiatric/Behavioral: Negative.    Blood pressure 129/84, pulse 66, temperature 97.7 F (36.5 C), temperature source Oral, resp. rate 17, SpO2 99 %. There is no height or weight on file to calculate BMI.   Treatment Plan Summary: Medication management and Plan 1.as far as blood pressure she is showing great improvement and is in the normal range.  No change to medication.  2.blood sugars have been in the mid 100s the last time they were checked.  She is on metformin.  We have ordered a hemoglobin A1c but I do not see a result yet.  We are not doing regular fingersticks at this point.  3.hypokalemia.  Last checked yesterday morning potassium was 3.3.  I will try rechecking it again tomorrow but she is now back on her potassium supplement.  4.schizoaffective disorder.  Patient remains agitated disorganized and paranoid with poor insight.  Consistently refusing medicine despite everything that I offered.  Patient meets criteria for forced medicine because of the impossibility of her improving to where she could be discharged without them.  We will begin the process to initiate forced medicines which will start with IM olanzapine if she refuses oral Depakote and Risperdal.  Alethia Berthold, MD 04/20/2021, 2:03 PM

## 2021-04-20 NOTE — Progress Notes (Signed)
Recreation Therapy Notes  INPATIENT RECREATION TR PLAN  Patient Details Name: [name] Long MRN: [id number] DOB: 10/29/1950 Today's Date: 04/20/2021  Rec Therapy Plan Is patient appropriate for Therapeutic Recreation?: Yes Treatment times per week: at least 3 Estimated Length of Stay: 5-7 days TR Treatment/Interventions: Group participation (Comment)  Discharge Criteria Pt will be discharged from therapy if:: Discharged Treatment plan/goals/alternatives discussed and agreed upon by:: Patient/family  Discharge Summary     Anureet Bruington 04/20/2021, 1:19 PM

## 2021-04-21 LAB — HEMOGLOBIN A1C
Hgb A1c MFr Bld: 8.3 % — ABNORMAL HIGH (ref 4.8–5.6)
Mean Plasma Glucose: 192 mg/dL

## 2021-04-21 NOTE — BH IP Treatment Plan (Addendum)
Interdisciplinary Treatment and Diagnostic Plan Update  04/21/2021 Time of Session: 9:30AM [name] Long MRN: [id number]  Principal Diagnosis: Schizoaffective disorder [facility])  Secondary Diagnoses: Principal Problem:   Schizoaffective disorder ([place]) Active Problems:   Hypothyroidism   Type 2 diabetes mellitus ([place])   HYPERCHOLESTEROLEMIA   HYPERTENSION, BENIGN SYSTEMIC   Hypokalemia   Atrial fibrillation (HCC)   Current Medications:  Current Facility-Administered Medications  Medication Dose Route Frequency Provider Last Rate Last Admin   acetaminophen (TYLENOL) tablet 650 mg  650 mg Oral Q4H PRN Clapacs, Madie Reno, MD       acetaminophen (TYLENOL) tablet 650 mg  650 mg Oral Q6H PRN Clapacs, Madie Reno, MD   650 mg at 04/20/21 0630   albuterol (VENTOLIN HFA) 108 (90 Base) MCG/ACT inhaler 2 puff  2 puff Inhalation QID PRN Clapacs, Madie Reno, MD       alum & mag hydroxide-simeth (MAALOX/MYLANTA) 200-200-20 MG/5ML suspension 30 mL  30 mL Oral Q4H PRN Clapacs, Madie Reno, MD       amLODipine (NORVASC) tablet 5 mg  5 mg Oral Daily Clapacs, Madie Reno, MD   5 mg at 04/21/21 0904   divalproex (DEPAKOTE) DR tablet 250 mg  250 mg Oral Q12H Clapacs, John T, MD   250 mg at 04/21/21 4656   hydrochlorothiazide (HYDRODIURIL) tablet 25 mg  25 mg Oral Daily Clapacs, Madie Reno, MD   25 mg at 04/21/21 8127   levothyroxine (SYNTHROID) tablet 25 mcg  25 mcg Oral QAC breakfast Clapacs, Madie Reno, MD   25 mcg at 04/21/21 5170   linagliptin (TRADJENTA) tablet 5 mg  5 mg Oral Daily Clapacs, Madie Reno, MD   5 mg at 04/21/21 0174   magnesium hydroxide (MILK OF MAGNESIA) suspension 30 mL  30 mL Oral Daily PRN Clapacs, Madie Reno, MD       metFORMIN (GLUCOPHAGE) tablet 1,000 mg  1,000 mg Oral BID WC Clapacs, John T, MD   1,000 mg at 04/21/21 9449   metoprolol succinate (TOPROL-XL) 24 hr tablet 50 mg  50 mg Oral Daily Clapacs, Madie Reno, MD   50 mg at 04/21/21 0903   OLANZapine (ZYPREXA) injection 5 mg  5 mg Intramuscular BID PRN Clapacs, Madie Reno, MD   5 mg at 04/20/21 1930   pantoprazole (PROTONIX) EC tablet 40 mg  40 mg Oral Daily Clapacs, Madie Reno, MD   40 mg at 04/21/21 6759   potassium chloride (KLOR-CON) CR tablet 10 mEq  10 mEq Oral BID Clapacs, Madie Reno, MD   10 mEq at 04/21/21 1638   risperiDONE (RISPERDAL M-TABS) disintegrating tablet 1 mg  1 mg Oral BID Clapacs, Madie Reno, MD   1 mg at 04/21/21 4665   PTA Medications: Medications Prior to Admission  Medication Sig Dispense Refill Last Dose   albuterol (ACCUNEB) 1.25 MG/3ML nebulizer solution USE 1 VIAL VIA NEBULIZER EVERY 6 HOURS AS NEEDED FOR WHEEZING (Patient taking differently: Take 1 ampule by nebulization every 6 (six) hours as needed for shortness of breath or wheezing.) 75 mL 1    albuterol (VENTOLIN HFA) 108 (90 Base) MCG/ACT inhaler Inhale 2 puffs into the lungs 4 (four) times daily as needed for wheezing or shortness of breath. 18 g 1    amLODipine (NORVASC) 5 MG tablet Take 1 tablet (5 mg total) by mouth daily. 90 tablet 1    Blood Glucose Monitoring Suppl (FREESTYLE LITE) w/Device KIT 1 each by Does not apply route daily. Dx:E11.9 1 kit 0  Blood Pressure Monitoring (BLOOD PRESSURE MONITOR/WRIST) KIT 1 Units by Does not apply route daily at 6 (six) AM. 1 kit 0    conjugated estrogens (PREMARIN) vaginal cream Place 1 Applicatorful vaginally at bedtime. 42.5 g 3    dapagliflozin propanediol (FARXIGA) 5 MG TABS tablet Take 1 tablet (5 mg total) by mouth daily. (Patient taking differently: Take 5 mg by mouth daily. Daughter states she is no longer taking this) 30 tablet 1    Divalproex Sodium (DEPAKOTE PO) Take 750 mg by mouth at bedtime.   More than a month   glucose blood (ACCU-CHEK GUIDE) test strip USE TO TEST BLOOD SUGAR ONCE DAILY 100 strip 1    glucose blood (FREESTYLE LITE) test strip Use to test blood sugar one daily. Dx: E11.9 100 each 5    glucose blood (ONETOUCH VERIO) test strip Use to test blood sugar 2-3 times daily. Dx: E11.9 100 each 11    glucose blood test  strip One Touch Ultra 2 Test Strips Use to test blood sugar once daily. Dx: E11.9 100 each 5    glucose blood test strip Use 2-3 times daily 100 each 12    hydrochlorothiazide (HYDRODIURIL) 25 MG tablet TAKE 1 TABLET(25 MG) BY MOUTH DAILY (Patient taking differently: Take 25 mg by mouth daily.) 30 tablet 3    ibuprofen (ADVIL) 800 MG tablet TAKE 1 TABLET(800 MG) BY MOUTH EVERY 8 HOURS AS NEEDED (Patient taking differently: Take 800 mg by mouth every 8 (eight) hours as needed for headache or moderate pain.) 30 tablet 0    Lancets (FREESTYLE) lancets Use to check blood sugar once daily. Dx: E11.9 100 each 5    levothyroxine (SYNTHROID) 25 MCG tablet TAKE 1 TABLET(25 MCG) BY MOUTH DAILY BEFORE BREAKFAST (Patient taking differently: Take 25 mcg by mouth daily before breakfast.) 30 tablet 3    LORazepam (ATIVAN) 0.5 MG tablet Take 0.5 mg by mouth at bedtime.   More than a month   metFORMIN (GLUCOPHAGE) 1000 MG tablet Take 1 tablet (1,000 mg total) by mouth 2 (two) times daily with a meal. 90 tablet 1    methocarbamol (ROBAXIN) 500 MG tablet Take 1 tablet (500 mg total) by mouth every 6 (six) hours as needed for muscle spasms. 30 tablet 0    metoprolol succinate (TOPROL-XL) 50 MG 24 hr tablet Take 1 tablet (50 mg total) by mouth daily. Take with or immediately following a meal. 30 tablet 3    omeprazole (PRILOSEC) 40 MG capsule Take 1 capsule (40 mg total) by mouth 2 (two) times daily. 60 capsule 1    penicillin v potassium (VEETID) 500 MG tablet Take 1 tablet (500 mg total) by mouth 4 (four) times daily. 120 tablet 0    potassium chloride (KLOR-CON) 10 MEQ tablet Take 1 tablet (10 mEq total) by mouth 2 (two) times daily. 60 tablet 3    potassium chloride SA (KLOR-CON) 20 MEQ tablet Take 2 tablets (40 mEq total) by mouth once for 1 dose. 2 tablet 0    rosuvastatin (CRESTOR) 40 MG tablet Take 1 tablet (40 mg total) by mouth daily. 90 tablet 3    sitaGLIPtin (JANUVIA) 50 MG tablet Take 1 tablet (50 mg total)  by mouth daily. 90 tablet 1    sodium chloride 0.9 % infusion Inject into the vein once. 10 ml/hr      sucralfate (CARAFATE) 1 GM/10ML suspension SHAKE LIQUID AND TAKE 10 ML(1 GRAM) BY MOUTH FOUR TIMES DAILY (Patient taking differently: Take 1  g by mouth 4 (four) times daily.) 1200 mL 0     Patient Stressors: Loss of Father and husband 1 year ago    Patient Strengths: Supportive family/friends   Treatment Modalities: Medication Management, Group therapy, Case management,  1 to 1 session with clinician, Psychoeducation, Recreational therapy.   Physician Treatment Plan for Primary Diagnosis: Schizoaffective disorder ([place]) Long Term Goal(s): Improvement in symptoms so as ready for discharge   Short Term Goals: Compliance with prescribed medications will improve Ability to demonstrate self-control will improve Ability to identify and develop effective coping behaviors will improve  Medication Management: Evaluate patient's response, side effects, and tolerance of medication regimen.  Therapeutic Interventions: 1 to 1 sessions, Unit Group sessions and Medication administration.  Evaluation of Outcomes: Not Met  Physician Treatment Plan for Secondary Diagnosis: Principal Problem:   Schizoaffective disorder ([place]) Active Problems:   Hypothyroidism   Type 2 diabetes mellitus ([place])   HYPERCHOLESTEROLEMIA   HYPERTENSION, BENIGN SYSTEMIC   Hypokalemia   Atrial fibrillation (HCC)  Long Term Goal(s): Improvement in symptoms so as ready for discharge   Short Term Goals: Compliance with prescribed medications will improve Ability to demonstrate self-control will improve Ability to identify and develop effective coping behaviors will improve     Medication Management: Evaluate patient's response, side effects, and tolerance of medication regimen.  Therapeutic Interventions: 1 to 1 sessions, Unit Group sessions and Medication administration.  Evaluation of Outcomes: Not Met   RN  Treatment Plan for Primary Diagnosis: Schizoaffective disorder ([place]) Long Term Goal(s): Knowledge of disease and therapeutic regimen to maintain health will improve  Short Term Goals: Ability to remain free from injury will improve, Ability to verbalize frustration and anger appropriately will improve, Ability to demonstrate self-control, Ability to participate in decision making will improve, Ability to verbalize feelings will improve, Ability to identify and develop effective coping behaviors will improve, and Compliance with prescribed medications will improve  Medication Management: RN will administer medications as ordered by provider, will assess and evaluate patient's response and provide education to patient for prescribed medication. RN will report any adverse and/or side effects to prescribing provider.  Therapeutic Interventions: 1 on 1 counseling sessions, Psychoeducation, Medication administration, Evaluate responses to treatment, Monitor vital signs and CBGs as ordered, Perform/monitor CIWA, COWS, AIMS and Fall Risk screenings as ordered, Perform wound care treatments as ordered.  Evaluation of Outcomes: Not Met   LCSW Treatment Plan for Primary Diagnosis: Schizoaffective disorder ([place]) Long Term Goal(s): Safe transition to appropriate next level of care at discharge, Engage patient in therapeutic group addressing interpersonal concerns.  Short Term Goals: Engage patient in aftercare planning with referrals and resources, Increase social support, Increase ability to appropriately verbalize feelings, Increase emotional regulation, Facilitate acceptance of mental health diagnosis and concerns, Identify triggers associated with mental health/substance abuse issues, and Increase skills for wellness and recovery  Therapeutic Interventions: Assess for all discharge needs, 1 to 1 time with Social worker, Explore available resources and support systems, Assess for adequacy in community support  network, Educate family and significant other(s) on suicide prevention, Complete Psychosocial Assessment, Interpersonal group therapy.  Evaluation of Outcomes: Not Met   Progress in Treatment: Attending groups: No. Participating in groups: No. Taking medication as prescribed: No. Toleration medication: Yes. Family/Significant other contact made: Family/Significant other contact made: No, will contact:  pt's guardian/daughter [name] Long  Patient understands diagnosis: No. Discussing patient identified problems/goals with staff: No. Medical problems stabilized or resolved: Yes. Denies suicidal/homicidal ideation: Yes. Issues/concerns per patient  self-inventory: No. Other: None  New problem(s) identified: No, Describe:  None  New Short Term/Long Term Goal(s):  elimination of symptoms of psychosis, medication management for mood stabilization; elimination of SI thoughts; development of comprehensive mental wellness plan.   Patient Goals:  "took the Risperdal shots, now I'm ready to go"  Discharge Plan or Barriers: CSW will assist pt with development of appropriate discharge/aftercare plan.   Reason for Continuation of Hospitalization: Delusions  Medication stabilization  Estimated Length of Stay: 1-7 days   Scribe for Treatment Team: Obelia Bonello A [name], Latanya Presser 04/21/2021 9:35 AM

## 2021-04-21 NOTE — Group Note (Signed)
[place] LCSW Group Therapy Note   Group Date: 04/21/2021 Start Time: 1445 End Time: 1500   Type of Therapy/Topic:  Group Therapy:  Emotion Regulation  Participation Level:  Did Not Attend    Description of Group:    The purpose of this group is to assist patients in learning to regulate negative emotions and experience positive emotions. Patients will be guided to discuss ways in which they have been vulnerable to their negative emotions. These vulnerabilities will be juxtaposed with experiences of positive emotions or situations, and patients challenged to use positive emotions to combat negative ones. Special emphasis will be placed on coping with negative emotions in conflict situations, and patients will process healthy conflict resolution skills.  Therapeutic Goals: Patient will identify two positive emotions or experiences to reflect on in order to balance out negative emotions:  Patient will label two or more emotions that they find the most difficult to experience:  Patient will be able to demonstrate positive conflict resolution skills through discussion or role plays:   Summary of Patient Progress:  X    Therapeutic Modalities:   Cognitive Behavioral Therapy Feelings Identification Dialectical Behavioral Therapy   Doria Fern A [name], LCSWA

## 2021-04-21 NOTE — Progress Notes (Signed)
Patient had an uneventful visitation with her daughter. Patient refused to answer any assessment questions.Patient states " I don't need to be here. I don't need any medicine." But patient took Risperdal and Potassium. For the Depakote patients states " that's not my night medicine."  Patient noted talking to herself with gestures. Patient in & out of her room. Talking to peer appropriately in the day room. No aggressive behaviors noted. Patient remains safe in the unit.

## 2021-04-21 NOTE — Progress Notes (Signed)
Recreation Therapy Notes   Date: 04/21/2021  Time: 1:15pm    Location: Craft room    Behavioral response: N/A   Intervention Topic: Goals    Discussion/Intervention: Patient did not attend group.   Clinical Observations/Feedback:  Patient invited to group but refused to attend she explained because she was lied to about being discharged she would not be cooperating in anything.    Maheen Cwikla LRT/CTRS        Katilyn Miltenberger 04/21/2021 2:21 PM

## 2021-04-21 NOTE — Progress Notes (Signed)
[facility] MD Progress Note  04/21/2021 2:46 PM KENYANA HUSAK  MRN:  [id number] Subjective: Follow-up for patient with schizoaffective disorder started on forced meds yesterday.  Patient seen today and attended treatment team.  Patient had no complaints other than disagreeing with ongoing hospitalization.  Showed no insight regarding illness or condition.  Continues to be paranoid and disorganized in her thought verbally hostile but not threatening and not physically violent.  Patient did take the injection but this morning appears to have cooperated with oral medication.  No new physical complaints.  Vitals stable.  No sign of medical problems.  She is neatly dressed and groomed and taking care of her ADLs. Principal Problem: Schizoaffective disorder ([place]) Diagnosis: Principal Problem:   Schizoaffective disorder ([place]) Active Problems:   Hypothyroidism   Type 2 diabetes mellitus (HCC)   HYPERCHOLESTEROLEMIA   HYPERTENSION, BENIGN SYSTEMIC   Hypokalemia   Atrial fibrillation (HCC)  Total Time spent with patient: 30 minutes  Past Psychiatric History: Past history of recurrent episodes of psychosis with a diagnosis of schizoaffective disorder  Past Medical History:  Past Medical History:  Diagnosis Date   Abscessed tooth 02/09/2020   Acute on chronic congestive heart failure ([place])    Adenomatous colon polyp    Arthritis    "real bad; all over" (07/12/2017)   Asthma    Atrial fibrillation ([place])    BIPOLAR DISORDER 08/31/2006   Qualifier: Diagnosis of  By: Dorathy Daft MD, Marjory Lies     CHRONIC KIDNEY DISEASE STAGE II (MILD) 09/14/2009   Annotation: eGFR 34 Qualifier: Diagnosis of  By: Jess Barters MD, Erik     Chronic lower back pain    Congestive heart failure (CHF) (HCC)    COPD (chronic obstructive pulmonary disease) ([place]) 09/23/2010   Diagnosed at [facility] in [facility] (Dr. Annamaria Boots)    DM (diabetes mellitus) type II controlled with renal manifestation ([place]) 08/31/2006   Qualifier: Diagnosis of  By:  Dorathy Daft MD, Marjory Lies     Dyspnea    "all my life; since 6th grade" (07/12/2017)   Fatty liver    HYPERCHOLESTEROLEMIA 08/31/2006   Intolerance to Lipitor OK on Crestor but medicaid no longer covering     HYPERTENSION, BENIGN SYSTEMIC 08/31/2006   Qualifier: Diagnosis of  By: Dorathy Daft MD, Aaron     Hypokalemia    HYPOTHYROIDISM, UNSPECIFIED 08/31/2006   Qualifier: Diagnosis of  By: Dorathy Daft MD, Marjory Lies     Leg swelling 03/14/2018   Pneumonia    "3 times" (07/12/2017)   Pulmonary nodule    Renal cyst    Schizophrenia ([place])    Scoliosis    Stomach problems    Thyroid disorder     Past Surgical History:  Procedure Laterality Date   CESAREAN SECTION     FOOT FRACTURE SURGERY Right    "steel plate in it"   FOOT SURGERY     " born w/dislocated foot"   FRACTURE SURGERY     KNEE ARTHROSCOPY Right    TOE SURGERY Bilateral    "both pinky toes"   TONSILLECTOMY AND ADENOIDECTOMY     Family History:  Family History  Problem Relation Age of Onset   Breast cancer Sister    Heart disease Mother 50   Rectal cancer Mother    Diabetes Father 38   Hypertension Father    Heart disease Brother    Asthma Daughter    Asthma Son    Breast cancer Sister    Asthma Daughter    Colon cancer Maternal  Grandmother    Stomach cancer Neg Hx    Esophageal cancer Neg Hx    Pancreatic cancer Neg Hx    Family Psychiatric  History: See previous Social History:  Social History   Substance and Sexual Activity  Alcohol Use Not Currently   Comment: 07/12/2017 "stopped 2 yr ago; did have a drink over the holidays recently"     Social History   Substance and Sexual Activity  Drug Use No    Social History   Socioeconomic History   Marital status: Divorced    Spouse name: Not on file   Number of children: 4   Years of education: 12   Highest education level: High school graduate  Occupational History   Not on file  Tobacco Use   Smoking status: Former    Packs/day: 0.25    Years: 45.00    Pack  years: 11.25    Types: Cigarettes   Smokeless tobacco: Never   Tobacco comments:    patient states she smokes 1 cigarette per month  Vaping Use   Vaping Use: Never used  Substance and Sexual Activity   Alcohol use: Not Currently    Comment: 07/12/2017 "stopped 2 yr ago; did have a drink over the holidays recently"   Drug use: No   Sexual activity: Not Currently  Other Topics Concern   Not on file  Social History Narrative   Patient lives alone in [place].    Patient does not drive, she uses insurance transportation or her children.   Patient enjoys twirling her paton, listening to music, dancing, and spending time with family.    Patient enjoys celebrating all holidays and birthdays of loved ones.   Social Determinants of Health   Financial Resource Strain: Not on file  Food Insecurity: Not on file  Transportation Needs: Not on file  Physical Activity: Not on file  Stress: Not on file  Social Connections: Not on file   Additional Social History:                         Sleep: Fair  Appetite:  Fair  Current Medications: Current Facility-Administered Medications  Medication Dose Route Frequency Provider Last Rate Last Admin   acetaminophen (TYLENOL) tablet 650 mg  650 mg Oral Q4H PRN Vallery Mcdade, Madie Reno, MD       acetaminophen (TYLENOL) tablet 650 mg  650 mg Oral Q6H PRN Kaydince Towles T, MD   650 mg at 04/20/21 0630   albuterol (VENTOLIN HFA) 108 (90 Base) MCG/ACT inhaler 2 puff  2 puff Inhalation QID PRN Skylar Flynt, Madie Reno, MD       alum & mag hydroxide-simeth (MAALOX/MYLANTA) 200-200-20 MG/5ML suspension 30 mL  30 mL Oral Q4H PRN Ieesha Abbasi T, MD       amLODipine (NORVASC) tablet 5 mg  5 mg Oral Daily Red Mandt T, MD   5 mg at 04/21/21 0904   divalproex (DEPAKOTE) DR tablet 250 mg  250 mg Oral Q12H Wretha Laris T, MD   250 mg at 04/21/21 1694   hydrochlorothiazide (HYDRODIURIL) tablet 25 mg  25 mg Oral Daily Takeria Marquina, Madie Reno, MD   25 mg at 04/21/21 5038    levothyroxine (SYNTHROID) tablet 25 mcg  25 mcg Oral QAC breakfast Johanne Mcglade, Madie Reno, MD   25 mcg at 04/21/21 0614   linagliptin (TRADJENTA) tablet 5 mg  5 mg Oral Daily Zabian Swayne, Madie Reno, MD   5 mg at 04/21/21 [id number]  magnesium hydroxide (MILK OF MAGNESIA) suspension 30 mL  30 mL Oral Daily PRN Xai Frerking T, MD       metFORMIN (GLUCOPHAGE) tablet 1,000 mg  1,000 mg Oral BID WC Maureena Dabbs T, MD   1,000 mg at 04/21/21 0903   metoprolol succinate (TOPROL-XL) 24 hr tablet 50 mg  50 mg Oral Daily Crucita Lacorte T, MD   50 mg at 04/21/21 0903   OLANZapine (ZYPREXA) injection 5 mg  5 mg Intramuscular BID PRN Kenzy Campoverde T, MD   5 mg at 04/20/21 1930   pantoprazole (PROTONIX) EC tablet 40 mg  40 mg Oral Daily Deasia Chiu T, MD   40 mg at 04/21/21 7001   potassium chloride (KLOR-CON) CR tablet 10 mEq  10 mEq Oral BID Dariella Gillihan T, MD   10 mEq at 04/21/21 7494   risperiDONE (RISPERDAL M-TABS) disintegrating tablet 1 mg  1 mg Oral BID Macayla Ekdahl, Madie Reno, MD   1 mg at 04/21/21 4967    Lab Results:  Results for orders placed or performed during the hospital encounter of 04/19/21 (from the past 48 hour(s))  TSH     Status: None   Collection Time: 04/19/21  7:33 PM  Result Value Ref Range   TSH 1.615 0.350 - 4.500 uIU/mL    Comment: Performed by a 3rd Generation assay with a functional sensitivity of <=0.01 uIU/mL. Performed at [facility], [address] [place], [place] [address]   Hemoglobin A1c     Status: Abnormal   Collection Time: 04/19/21  7:33 PM  Result Value Ref Range   Hgb A1c MFr Bld 8.3 (H) 4.8 - 5.6 %    Comment: (NOTE)         Prediabetes: 5.7 - 6.4         Diabetes: >6.4         Glycemic control for adults with diabetes: <7.0    Mean Plasma Glucose 192 mg/dL    Comment: (NOTE) Performed At: [facility] [address] [place], [place] [id number] Rush Farmer MD [id number]   Lipid panel     Status: Abnormal   Collection Time: 04/19/21  7:33 PM   Result Value Ref Range   Cholesterol 166 0 - 200 mg/dL   Triglycerides 213 (H) <150 mg/dL   HDL 43 >40 mg/dL   Total CHOL/HDL Ratio 3.9 RATIO   VLDL 43 (H) 0 - 40 mg/dL   LDL Cholesterol 80 0 - 99 mg/dL    Comment:        Total Cholesterol/HDL:CHD Risk Coronary Heart Disease Risk Table                     Men   Women  1/2 Average Risk   3.4   3.3  Average Risk       5.0   4.4  2 X Average Risk   9.6   7.1  3 X Average Risk  23.4   11.0        Use the calculated Patient Ratio above and the CHD Risk Table to determine the patient's CHD Risk.        ATP III CLASSIFICATION (LDL):  <100     mg/dL   Optimal  100-129  mg/dL   Near or Above                    Optimal  130-159  mg/dL   Borderline  160-189  mg/dL  High  >190     mg/dL   Very High Performed at [facility], [address] [place], [place] [address]    *Note: Due to a large number of results and/or encounters for the requested time period, some results have not been displayed. A complete set of results can be found in Results Review.    Blood Alcohol level:  Lab Results  Component Value Date   ETH <10 04/17/2021   ETH <10 07/06/7251    Metabolic Disorder Labs: Lab Results  Component Value Date   HGBA1C 8.3 (H) 04/19/2021   MPG 192 04/19/2021   MPG 200 10/01/2020   No results found for: PROLACTIN Lab Results  Component Value Date   CHOL 166 04/19/2021   TRIG 213 (H) 04/19/2021   HDL 43 04/19/2021   CHOLHDL 3.9 04/19/2021   VLDL 43 (H) 04/19/2021   LDLCALC 80 04/19/2021   LDLCALC 180 (H) 10/01/2020    Physical Findings: AIMS:  , ,  ,  ,    CIWA:    COWS:     Musculoskeletal: Strength & Muscle Tone: within normal limits Gait & Station: normal Patient leans: N/A  Psychiatric Specialty Exam:  Presentation  General Appearance:  No data recorded Eye Contact: No data recorded Speech: No data recorded Speech Volume: No data recorded Handedness: No data recorded  Mood and  Affect  Mood: No data recorded Affect: No data recorded  Thought Process  Thought Processes: No data recorded Descriptions of Associations:No data recorded Orientation:No data recorded Thought Content:No data recorded History of Schizophrenia/Schizoaffective disorder:Yes  Duration of Psychotic Symptoms:Greater than six months  Hallucinations:No data recorded Ideas of Reference:No data recorded Suicidal Thoughts:No data recorded Homicidal Thoughts:No data recorded  Sensorium  Memory: No data recorded Judgment: No data recorded Insight: No data recorded  Executive Functions  Concentration: No data recorded Attention Span: No data recorded Recall: No data recorded Fund of Knowledge: No data recorded Language: No data recorded  Psychomotor Activity  Psychomotor Activity: No data recorded  Assets  Assets: No data recorded  Sleep  Sleep: No data recorded   Physical Exam: Physical Exam Vitals and nursing note reviewed.  Constitutional:      Appearance: Normal appearance.  HENT:     Head: Normocephalic and atraumatic.     Mouth/Throat:     Pharynx: Oropharynx is clear.  Eyes:     Pupils: Pupils are equal, round, and reactive to light.  Cardiovascular:     Rate and Rhythm: Normal rate and regular rhythm.  Pulmonary:     Effort: Pulmonary effort is normal.     Breath sounds: Normal breath sounds.  Abdominal:     General: Abdomen is flat.     Palpations: Abdomen is soft.  Musculoskeletal:        General: Normal range of motion.  Skin:    General: Skin is warm and dry.  Neurological:     General: No focal deficit present.     Mental Status: She is alert. Mental status is at baseline.  Psychiatric:        Attention and Perception: Attention normal.        Mood and Affect: Mood normal. Affect is angry and inappropriate.        Speech: Speech is rapid and pressured and tangential.        Behavior: Behavior is agitated. Behavior is not aggressive.         Thought Content: Thought content is paranoid.  Cognition and Memory: Cognition is impaired.        Judgment: Judgment is impulsive.   Review of Systems  Constitutional: Negative.   HENT: Negative.    Eyes: Negative.   Respiratory: Negative.    Cardiovascular: Negative.   Gastrointestinal: Negative.   Musculoskeletal: Negative.   Skin: Negative.   Neurological: Negative.   Psychiatric/Behavioral:  Negative for depression, hallucinations, substance abuse and suicidal ideas. The patient is nervous/anxious.   Blood pressure 119/68, pulse 65, temperature 97.8 F (36.6 C), temperature source Oral, resp. rate 17, SpO2 100 %. There is no height or weight on file to calculate BMI.   Treatment Plan Summary: Medication management and Plan patient was started on oral medication that low dose including Depakote 250 mg twice a day just to see if we can get her started taking it.  If she continues we will increase Depakote dose to a more therapeutic level while continuing risperidone.  Daily reassessment of thought patterns and agitation.  Hope for eventual plan for discharge back home.  Alethia Berthold, MD 04/21/2021, 2:46 PM

## 2021-04-21 NOTE — Telephone Encounter (Signed)
Spoke with leadership yesterday and have made Dr. Sharol Given aware that this pt has been involuntarily committed and receiving services. If a consult is needed the hospital will request this.  Will sign off on this message.

## 2021-04-21 NOTE — Progress Notes (Signed)
Patient mood is labile. She refuses to answer most assessment questions and instead stares at this Probation officer. She also begins talking about "Dellis Filbert" and how "Dellis Filbert should not be fired". When asked who Dellis Filbert was, patient says that she is talking to herself. Patient also says that she is not taking any psych medications, but proceeded to take all her medications.  She is suspicious of staff members and says that she remembers them from [place]. Patient also comes to staff multiple times, stating that she should get discharged because she "took a shot last night". When told she could not discharge today,  she states that she is going to put out a warrant to arrest staff and curses at staff.   Patient remains safe on the unit at this time.

## 2021-04-22 LAB — BASIC METABOLIC PANEL
Anion gap: 9 (ref 5–15)
BUN: 27 mg/dL — ABNORMAL HIGH (ref 8–23)
CO2: 26 mmol/L (ref 22–32)
Calcium: 9.7 mg/dL (ref 8.9–10.3)
Chloride: 98 mmol/L (ref 98–111)
Creatinine, Ser: 1.14 mg/dL — ABNORMAL HIGH (ref 0.44–1.00)
GFR, Estimated: 52 mL/min — ABNORMAL LOW (ref >60)
Glucose, Bld: 173 mg/dL — ABNORMAL HIGH (ref 70–99)
Potassium: 3.5 mmol/L (ref 3.5–5.1)
Sodium: 133 mmol/L — ABNORMAL LOW (ref 135–145)

## 2021-04-22 NOTE — [facility] Suicide Risk Assessment (Signed)
[place] INPATIENT:  Family/Significant Other Suicide Prevention Education  Suicide Prevention Education:  Contact Attempts: Cassandra Frese, Horticulturist, commercial (name of family member/significant other) has been identified by the patient as the family member/significant other with whom the patient will be residing, and identified as the person(s) who will aid the patient in the event of a mental health crisis.  With written consent from the patient, two attempts were made to provide suicide prevention education, prior to and/or following the patient's discharge.  We were unsuccessful in providing suicide prevention education.  A suicide education pamphlet was given to the patient to share with family/significant other.  Date and time of first attempt:04/20/21/ 3:45pm [name] Long 04/20/2021, 4:12 PM

## 2021-04-22 NOTE — Progress Notes (Addendum)
GROUP INTERACTION   10A-10:15A Question Ball  Pt attended , interacted w/ peers   10:15A-10:45A Therapy Dog- [place] Pt was alert, friendly, interactive

## 2021-04-22 NOTE — Progress Notes (Signed)
[facility] MD Progress Note  04/22/2021 4:13 PM STEPH CHEADLE  MRN:  [id number] Subjective: Patient seen and chart reviewed.  70 year old woman with schizoaffective disorder.  Neatly dressed and groomed.  Blood pressure under better control.  New labs today show stable findings in terms of her electrolytes and kidney function.  Patient herself initially was pleasant during the interview but when I told her that I still was not planning on discharge today she became abruptly hostile and angry.  Cursed at me multiple times made several irrational threats walked out of the room. Principal Problem: Schizoaffective disorder ([place]) Diagnosis: Principal Problem:   Schizoaffective disorder ([place]) Active Problems:   Hypothyroidism   Type 2 diabetes mellitus ([place])   HYPERCHOLESTEROLEMIA   HYPERTENSION, BENIGN SYSTEMIC   Hypokalemia   Atrial fibrillation ([place])  Total Time spent with patient: 30 minutes  Past Psychiatric History: Past history of schizoaffective disorder  Past Medical History:  Past Medical History:  Diagnosis Date   Abscessed tooth 02/09/2020   Acute on chronic congestive heart failure ([place])    Adenomatous colon polyp    Arthritis    "real bad; all over" (07/12/2017)   Asthma    Atrial fibrillation ([place])    BIPOLAR DISORDER 08/31/2006   Qualifier: Diagnosis of  By: Dorathy Daft MD, Marjory Lies     CHRONIC KIDNEY DISEASE STAGE II (MILD) 09/14/2009   Annotation: eGFR 85 Qualifier: Diagnosis of  By: Jess Barters MD, Erik     Chronic lower back pain    Congestive heart failure (CHF) (HCC)    COPD (chronic obstructive pulmonary disease) ([place]) 09/23/2010   Diagnosed at [facility] in 2008 (Dr. Annamaria Boots)    DM (diabetes mellitus) type II controlled with renal manifestation ([place]) 08/31/2006   Qualifier: Diagnosis of  By: Dorathy Daft MD, Marjory Lies     Dyspnea    "all my life; since 6th grade" (07/12/2017)   Fatty liver    HYPERCHOLESTEROLEMIA 08/31/2006   Intolerance to Lipitor OK on Crestor but medicaid no longer  covering     HYPERTENSION, BENIGN SYSTEMIC 08/31/2006   Qualifier: Diagnosis of  By: Dorathy Daft MD, Aaron     Hypokalemia    HYPOTHYROIDISM, UNSPECIFIED 08/31/2006   Qualifier: Diagnosis of  By: Dorathy Daft MD, Marjory Lies     Leg swelling 03/14/2018   Pneumonia    "3 times" (07/12/2017)   Pulmonary nodule    Renal cyst    Schizophrenia ([place])    Scoliosis    Stomach problems    Thyroid disorder     Past Surgical History:  Procedure Laterality Date   CESAREAN SECTION     FOOT FRACTURE SURGERY Right    "steel plate in it"   FOOT SURGERY     " born w/dislocated foot"   FRACTURE SURGERY     KNEE ARTHROSCOPY Right    TOE SURGERY Bilateral    "both pinky toes"   TONSILLECTOMY AND ADENOIDECTOMY     Family History:  Family History  Problem Relation Age of Onset   Breast cancer Sister    Heart disease Mother 72   Rectal cancer Mother    Diabetes Father 11   Hypertension Father    Heart disease Brother    Asthma Daughter    Asthma Son    Breast cancer Sister    Asthma Daughter    Colon cancer Maternal Grandmother    Stomach cancer Neg Hx    Esophageal cancer Neg Hx    Pancreatic cancer Neg Hx    Family Psychiatric  History: See previous Social History:  Social History   Substance and Sexual Activity  Alcohol Use Not Currently   Comment: 07/12/2017 "stopped 2 yr ago; did have a drink over the holidays recently"     Social History   Substance and Sexual Activity  Drug Use No    Social History   Socioeconomic History   Marital status: Divorced    Spouse name: Not on file   Number of children: 4   Years of education: 12   Highest education level: High school graduate  Occupational History   Not on file  Tobacco Use   Smoking status: Former    Packs/day: 0.25    Years: 45.00    Pack years: 11.25    Types: Cigarettes   Smokeless tobacco: Never   Tobacco comments:    patient states she smokes 1 cigarette per month  Vaping Use   Vaping Use: Never used  Substance and  Sexual Activity   Alcohol use: Not Currently    Comment: 07/12/2017 "stopped 2 yr ago; did have a drink over the holidays recently"   Drug use: No   Sexual activity: Not Currently  Other Topics Concern   Not on file  Social History Narrative   Patient lives alone in [place].    Patient does not drive, she uses insurance transportation or her children.   Patient enjoys twirling her paton, listening to music, dancing, and spending time with family.    Patient enjoys celebrating all holidays and birthdays of loved ones.   Social Determinants of Health   Financial Resource Strain: Not on file  Food Insecurity: Not on file  Transportation Needs: Not on file  Physical Activity: Not on file  Stress: Not on file  Social Connections: Not on file   Additional Social History:                         Sleep: Poor  Appetite:  Poor  Current Medications: Current Facility-Administered Medications  Medication Dose Route Frequency Provider Last Rate Last Admin   acetaminophen (TYLENOL) tablet 650 mg  650 mg Oral Q4H PRN Paisyn Guercio, Madie Reno, MD       acetaminophen (TYLENOL) tablet 650 mg  650 mg Oral Q6H PRN Demontrae Gilbert T, MD   650 mg at 04/20/21 0630   albuterol (VENTOLIN HFA) 108 (90 Base) MCG/ACT inhaler 2 puff  2 puff Inhalation QID PRN Manaal Mandala, Madie Reno, MD       alum & mag hydroxide-simeth (MAALOX/MYLANTA) 200-200-20 MG/5ML suspension 30 mL  30 mL Oral Q4H PRN Katheline Brendlinger T, MD       amLODipine (NORVASC) tablet 5 mg  5 mg Oral Daily Hermon Zea T, MD   5 mg at 04/22/21 0904   divalproex (DEPAKOTE) DR tablet 250 mg  250 mg Oral Q12H Finneas Mathe T, MD   250 mg at 04/22/21 0175   hydrochlorothiazide (HYDRODIURIL) tablet 25 mg  25 mg Oral Daily Manning Luna, Madie Reno, MD   25 mg at 04/22/21 1025   levothyroxine (SYNTHROID) tablet 25 mcg  25 mcg Oral QAC breakfast Kimberely Mccannon, Madie Reno, MD   25 mcg at 04/22/21 8527   linagliptin (TRADJENTA) tablet 5 mg  5 mg Oral Daily Nakayla Rorabaugh T, MD   5 mg  at 04/22/21 7824   magnesium hydroxide (MILK OF MAGNESIA) suspension 30 mL  30 mL Oral Daily PRN Antonis Lor, Madie Reno, MD       metFORMIN (GLUCOPHAGE) tablet  1,000 mg  1,000 mg Oral BID WC Mozel Burdett, Madie Reno, MD   1,000 mg at 04/22/21 0904   metoprolol succinate (TOPROL-XL) 24 hr tablet 50 mg  50 mg Oral Daily Alyzza Andringa, Madie Reno, MD   50 mg at 04/22/21 0901   OLANZapine (ZYPREXA) injection 5 mg  5 mg Intramuscular BID PRN Maebel Marasco, Madie Reno, MD   5 mg at 04/20/21 1930   pantoprazole (PROTONIX) EC tablet 40 mg  40 mg Oral Daily Raiyah Speakman, Madie Reno, MD   40 mg at 04/22/21 0902   potassium chloride (KLOR-CON) CR tablet 10 mEq  10 mEq Oral BID Maisen Klingler, Madie Reno, MD   10 mEq at 04/22/21 1829   risperiDONE (RISPERDAL M-TABS) disintegrating tablet 1 mg  1 mg Oral BID Shantell Belongia, Madie Reno, MD   1 mg at 04/22/21 9371    Lab Results:  Results for orders placed or performed during the hospital encounter of 04/19/21 (from the past 48 hour(s))  Basic metabolic panel     Status: Abnormal   Collection Time: 04/22/21 10:26 AM  Result Value Ref Range   Sodium 133 (L) 135 - 145 mmol/L   Potassium 3.5 3.5 - 5.1 mmol/L   Chloride 98 98 - 111 mmol/L   CO2 26 22 - 32 mmol/L   Glucose, Bld 173 (H) 70 - 99 mg/dL    Comment: Glucose reference range applies only to samples taken after fasting for at least 8 hours.   BUN 27 (H) 8 - 23 mg/dL   Creatinine, Ser 1.14 (H) 0.44 - 1.00 mg/dL   Calcium 9.7 8.9 - 10.3 mg/dL   GFR, Estimated 52 (L) >60 mL/min    Comment: (NOTE) Calculated using the CKD-EPI Creatinine Equation (2021)    Anion gap 9 5 - 15    Comment: Performed at [facility], [address] [place], [place] [address]   *Note: Due to a large number of results and/or encounters for the requested time period, some results have not been displayed. A complete set of results can be found in Results Review.    Blood Alcohol level:  Lab Results  Component Value Date   ETH <10 04/17/2021   ETH <10 93/81/0175     Metabolic Disorder Labs: Lab Results  Component Value Date   HGBA1C 8.3 (H) 04/19/2021   MPG 192 04/19/2021   MPG 200 10/01/2020   No results found for: PROLACTIN Lab Results  Component Value Date   CHOL 166 04/19/2021   TRIG 213 (H) 04/19/2021   HDL 43 04/19/2021   CHOLHDL 3.9 04/19/2021   VLDL 43 (H) 04/19/2021   LDLCALC 80 04/19/2021   LDLCALC 180 (H) 10/01/2020    Physical Findings: AIMS:  , ,  ,  ,    CIWA:    COWS:     Musculoskeletal: Strength & Muscle Tone: within normal limits Gait & Station: normal Patient leans: N/A  Psychiatric Specialty Exam:  Presentation  General Appearance:  No data recorded Eye Contact: No data recorded Speech: No data recorded Speech Volume: No data recorded Handedness: No data recorded  Mood and Affect  Mood: No data recorded Affect: No data recorded  Thought Process  Thought Processes: No data recorded Descriptions of Associations:No data recorded Orientation:No data recorded Thought Content:No data recorded History of Schizophrenia/Schizoaffective disorder:Yes  Duration of Psychotic Symptoms:Greater than six months  Hallucinations:No data recorded Ideas of Reference:No data recorded Suicidal Thoughts:No data recorded Homicidal Thoughts:No data recorded  Sensorium  Memory: No data recorded  Judgment: No data recorded Insight: No data recorded  Executive Functions  Concentration: No data recorded Attention Span: No data recorded Recall: No data recorded Fund of Knowledge: No data recorded Language: No data recorded  Psychomotor Activity  Psychomotor Activity: No data recorded  Assets  Assets: No data recorded  Sleep  Sleep: No data recorded   Physical Exam: Physical Exam Vitals and nursing note reviewed.  Constitutional:      Appearance: Normal appearance.  HENT:     Head: Normocephalic and atraumatic.     Mouth/Throat:     Pharynx: Oropharynx is clear.  Eyes:      Pupils: Pupils are equal, round, and reactive to light.  Cardiovascular:     Rate and Rhythm: Normal rate and regular rhythm.  Pulmonary:     Effort: Pulmonary effort is normal.     Breath sounds: Normal breath sounds.  Abdominal:     General: Abdomen is flat.     Palpations: Abdomen is soft.  Musculoskeletal:        General: Normal range of motion.  Skin:    General: Skin is warm and dry.  Neurological:     General: No focal deficit present.     Mental Status: She is alert. Mental status is at baseline.  Psychiatric:        Attention and Perception: Attention normal.        Mood and Affect: Mood normal. Affect is angry and inappropriate.        Speech: Speech is rapid and pressured and tangential.        Behavior: Behavior is agitated.        Thought Content: Thought content is paranoid. Thought content does not include homicidal or suicidal ideation.        Cognition and Memory: Memory is impaired.        Judgment: Judgment is impulsive.   Review of Systems  Constitutional: Negative.   HENT: Negative.    Eyes: Negative.   Respiratory: Negative.    Cardiovascular: Negative.   Gastrointestinal: Negative.   Musculoskeletal: Negative.   Skin: Negative.   Neurological: Negative.   Psychiatric/Behavioral:  Negative for depression, hallucinations, substance abuse and suicidal ideas. The patient has insomnia. The patient is not nervous/anxious.   Blood pressure 113/65, pulse 97, temperature 98.2 F (36.8 C), temperature source Oral, resp. rate 18, SpO2 100 %. There is no height or weight on file to calculate BMI.   Treatment Plan Summary: Medication management and Plan patient showed me her journal today in which she had quite a few pages of handwritten notes stream of thought.  She told me she stays up at 2 in the morning and works on it.  Current presentation still manic and psychotic.  Patient did not understand that discharge was not predicated on a single cooperative dose with  medicine but clinical improvement.  Insight poor.  Continue going up on dose of Depakote.  Alethia Berthold, MD 04/22/2021, 4:13 PM

## 2021-04-22 NOTE — [facility] Suicide Risk Assessment (Signed)
[name] Long INPATIENT:  Family/Significant Other Suicide Prevention Education  Suicide Prevention Education:  Contact Attempts: [name] Long, Horticulturist, commercial (name of family member/significant other) has been identified by the patient as the family member/significant other with whom the patient will be residing, and identified as the person(s) who will aid the patient in the event of a mental health crisis.  With written consent from the patient, two attempts were made to provide suicide prevention education, prior to and/or following the patient's discharge.  We were unsuccessful in providing suicide prevention education.  A suicide education pamphlet was given to the patient to share with family/significant other.  Date and time of second attempt:04/21/21/12:30 pm   [name] Long A [name] 04/22/2021, 8:25 AM

## 2021-04-22 NOTE — B[facility] Counselor (Signed)
CSW spoke with Deb Loudin [phone], who stated that they are trying to find her mother new housing since her lease ends in December. She stated that pt can move in with her temporarily but requested any housing resources like assisted living for her mother.   Joannie Medine [name], MSW, LCSW-A 10/20/202211:48 AM

## 2021-04-22 NOTE — Progress Notes (Signed)
Patient is calm and cooperative with assessment. She denies SI and HI. Patient is observed to be responding to internal stimuli and is talking to herself and making hand gestures. Patient does not appear to be paranoid of staff this morning and is in a pleasant mood. When given medications, patient looks at pill packages and counts all her pills. She says that she does not take Tradjenta at home. However, patient was educated on her medications and proceeded to take all morning medications. Patient is observed to be interacting appropriately with staff and other patients. She is less labile than previous encounters and no hostility was observed. She is agreeable to attending morning group. Patient remains safe on the unit at this time.

## 2021-04-22 NOTE — Progress Notes (Signed)
Recreation Therapy Notes  Date: 04/22/2021  Time: 10:15 am  Location: Day room    Behavioral response: Appropriate   Intervention Topic: Animal Assisted Therapy   Discussion/Intervention:  Animal Assisted Therapy took place today during group.  Animal Assisted Therapy is the planned inclusion of an animal in a patient's treatment plan. The patients were able to engage in therapy with an animal during group. Participants were educated on what a service dog is and the different between a support dog and a service dog. Patient were informed on how animal needs are like a person needs. Individuals were enlightened on the process to get a service animal or support animal. Patients got the opportunity to pet the animal and were offered emotional support from the animal and staff.  Clinical Observations/Feedback:  Patient came to group and was on topic and was focused on what peers and staff had to say. Participant shared their experiences and history with animals. Individual was social with peers, staff and animal while participating in group.  [name] Long LRT/CTRS            Said Rueb 04/22/2021 12:50 PM

## 2021-04-22 NOTE — Progress Notes (Signed)
Patient came to nurses station states " give me that medicine." Patient took Depakote with no issues and went back to sleep.

## 2021-04-22 NOTE — Group Note (Signed)
[facility] LCSW Group Therapy Note   Group Date: 04/22/2021 Start Time: 1430 End Time: 1515   Type of Therapy/Topic:  Group Therapy:  Balance in Life  Participation Level:  Did Not Attend   Description of Group:    This group will address the concept of balance and how it feels and looks when one is unbalanced. Patients will be encouraged to process areas in their lives that are out of balance, and identify reasons for remaining unbalanced. Facilitators will guide patients utilizing problem- solving interventions to address and correct the stressor making their life unbalanced. Understanding and applying boundaries will be explored and addressed for obtaining  and maintaining a balanced life. Patients will be encouraged to explore ways to assertively make their unbalanced needs known to significant others in their lives, using other group members and facilitator for support and feedback.  Therapeutic Goals: Patient will identify two or more emotions or situations they have that consume much of in their lives. Patient will identify signs/triggers that life has become out of balance:  Patient will identify two ways to set boundaries in order to achieve balance in their lives:  Patient will demonstrate ability to communicate their needs through discussion and/or role plays  Summary of Patient Progress:  Patient stated that she wanted to talk with the doctor so she did not want to miss him. Patient's affect was pleasant and mood non disruptive as well.     Therapeutic Modalities:   Cognitive Behavioral Therapy Solution-Focused Therapy Assertiveness Training   [place] A [name], LCSWA

## 2021-04-23 MED ORDER — DIVALPROEX SODIUM 500 MG PO DR TAB
500.0000 mg | DELAYED_RELEASE_TABLET | Freq: Two times a day (BID) | ORAL | Status: DC
  Administered 2021-04-23 – 2021-04-29 (×12): 500 mg via ORAL
  Filled 2021-04-23: qty 1
  Filled 2021-04-23: qty 2
  Filled 2021-04-23 (×10): qty 1
  Filled 2021-04-23: qty 2
  Filled 2021-04-23 (×3): qty 1

## 2021-04-23 NOTE — Group Note (Signed)
[facility] LCSW Group Therapy Note   Group Date: 04/23/2021 Start Time: 1430 End Time: 1500  Type of Therapy and Topic:  Group Therapy:  Feelings around Relapse and Recovery  Participation Level:  Did Not Attend     Description of Group:    Patients in this group will discuss emotions they experience before and after a relapse. They will process how experiencing these feelings, or avoidance of experiencing them, relates to having a relapse. Facilitator will guide patients to explore emotions they have related to recovery. Patients will be encouraged to process which emotions are more powerful. They will be guided to discuss the emotional reaction significant others in their lives may have to patients' relapse or recovery. Patients will be assisted in exploring ways to respond to the emotions of others without this contributing to a relapse.  Therapeutic Goals: Patient will identify two or more emotions that lead to relapse for them:  Patient will identify two emotions that result when they relapse:  Patient will identify two emotions related to recovery:  Patient will demonstrate ability to communicate their needs through discussion and/or role plays.   Summary of Patient Progress:  Pt stated that she did not want anything to do with me today. Pt declined to participate.   Therapeutic Modalities:   Cognitive Behavioral Therapy Solution-Focused Therapy Assertiveness Training Relapse Prevention Therapy   Korbyn Vanes A [name], LCSWA

## 2021-04-23 NOTE — Plan of Care (Signed)
Patient refused HS vital signs.  Has been in the milieu, cooperative but irritable and guarded at times. Stayed in the dayroom watching TV. Was observed actively talking to self with bizarre gestures.  Had a snack and took medications per encouragements. She appears to be improving in mood.  Encouragements and support provided. Safety precautions maintained.

## 2021-04-23 NOTE — Plan of Care (Signed)
Patient alert and oriented on unit and denies SI and HI. Patient labile and unpleasant at times during shift.  Patient continues to present paranoid "I don't need to be here I know the doctor is taking pictures of my organs and that lady is pretending to be me stealing my clothes" Patient is redirected and Probation officer offered emotional support during shift.  Patient did participate in therapeutic milieu. Patient did comply with scheduled medications as ordered per MD.  Patient did not verbalize any adverse effects from medications.  Patient denies Anxiety or Depression and pain.  Will continue to monitor pt with q 15 minute checks during shift.    Problem: Activity: Goal: Risk for activity intolerance will decrease Outcome: Progressing   Problem: Education: Goal: Knowledge of General Education information will improve Description: Including pain rating scale, medication(s)/side effects and non-pharmacologic comfort measures Outcome: Not Progressing   Problem: Health Behavior/Discharge Planning: Goal: Ability to manage health-related needs will improve Outcome: Not Progressing
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
## 2021-04-23 NOTE — Progress Notes (Signed)
Recreation Therapy Notes    Date: 04/23/2021  Time: 1:30pm   Location: Craft room   Behavioral response: Appropriate  Intervention Topic: Self-care  Discussion/Intervention:  Group content today was focused on Self-Care. The group defined self-care and some positive ways they care for themselves. Individuals expressed ways and reasons why they neglected any self-care in the past. Patients described ways to improve self-care in the future. The group explained what could happen if they did not do any self-care activities at all. The group participated in the intervention "self-care assessment" where they had a chance to discover some of their weaknesses and strengths in self- care. Patient came up with a self-care plan to improve themselves in the future.  Clinical Observations/Feedback: Patient came to group and was focused on what peer staff had to say about self-care. She explained that she could improve her self-care by being more active, exercising and dying her hair. Participant explained that she would like to learn to get in and out of the tub safely. Individual was social with staff while participating in the intervention.  [name] Long LRT/CTRS          Samra Pesch 04/23/2021 2:38 PM

## 2021-04-23 NOTE — Progress Notes (Signed)
Patient is calm and cooperative. Patient is medication compliant. Pt denies SI, HI, and AVH at this time. Pt is observed talking to herself in her room. Pt can be irritable and agitated at times. Pt remains safe on the unit at this time.

## 2021-04-24 MED ORDER — METHOCARBAMOL 500 MG PO TABS
500.0000 mg | ORAL_TABLET | Freq: Once | ORAL | Status: AC
  Administered 2021-04-24: 500 mg via ORAL
  Filled 2021-04-24: qty 1

## 2021-04-24 MED ORDER — METHOCARBAMOL 500 MG PO TABS
500.0000 mg | ORAL_TABLET | Freq: Three times a day (TID) | ORAL | Status: DC | PRN
  Administered 2021-04-24 – 2021-05-05 (×7): 500 mg via ORAL
  Filled 2021-04-24 (×10): qty 1

## 2021-04-24 NOTE — Progress Notes (Signed)
Patient  woke up requesting pain medication. Reported that she was having pain "allover" but was vague about it. Requested Tylenol stating "that is not even gonna work for me...but I will take it". Patient received Tylenol 650 mg po for pain rated at 10/10.
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
## 2021-04-24 NOTE — Plan of Care (Signed)
?  Problem: Coping: ?Goal: Level of anxiety will decrease ?Outcome: Progressing ?  ?Problem: Safety: ?Goal: Ability to remain free from injury will improve ?Outcome: Progressing ?  ?

## 2021-04-25 LAB — GLUCOSE, CAPILLARY: Glucose-Capillary: 141 mg/dL — ABNORMAL HIGH (ref 70–99)

## 2021-04-25 NOTE — Progress Notes (Signed)
Chaplain Maggie made initial visit with pt in the common area. She was receptive and attentive during the entire visit. Pt suggested turning off the TV to allow focus on the conversation. At one point she retrieved a journal from her room to share. Chaplain noted pt has beautiful cursive handwriting. She spoke of her parents and other family members. The conversation was inconsistent and seemed random, but pleasant. She shared her favorite scripture passage is psalm 23. Pt expressed appreciation for the visit. Continued spiritual care available per on call chaplain.

## 2021-04-25 NOTE — Plan of Care (Signed)
?  Problem: Coping: ?Goal: Level of anxiety will decrease ?Outcome: Progressing ?  ?Problem: Safety: ?Goal: Ability to remain free from injury will improve ?Outcome: Progressing ?  ?

## 2021-04-25 NOTE — Group Note (Deleted)
LCSW Group Therapy Note  Group Date: 04/25/2021 Start Time: 1300 End Time: 1400   Type of Therapy and Topic:  Group Therapy - How To Cope with Nervousness about Discharge   Participation Level:  {BHH PARTICIPATION IHWTU:88280}   Description of Group This process group involved identification of patients' feelings about discharge. Some of them are scheduled to be discharged soon, while others are new admissions, but each of them was asked to share thoughts and feelings surrounding discharge from the hospital. One common theme was that they are excited at the prospect of going home, while another was that many of them are apprehensive about sharing why they were hospitalized. Patients were given the opportunity to discuss these feelings with their peers in preparation for discharge.  Therapeutic Goals  1. Patient will identify their overall feelings about pending discharge. 2. Patient will think about how they might proactively address issues that they believe will once again arise once they get home (i.e. with parents). 3. Patients will participate in discussion about having hope for change.   Summary of Patient Progress:  *** was very active throughout the session. *** demonstrated *** insight into the subject matter, and proved open to input from peers and feedback from [place]. *** was respectful of peers and participated throughout the entire session.   Therapeutic Modalities Cognitive Behavioral Therapy   Sherilyn Dacosta 04/25/2021  12:57 PM
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
## 2021-04-25 NOTE — Progress Notes (Signed)
Patient refused synthroid this am. States the medication writer gave her on last pm made her dizzy and she would rather take it from someone she trusts. Vet up and down throughout the night. Currently in dayroom watching tv.

## 2021-04-25 NOTE — Plan of Care (Signed)
Patient alert and participates in therapeutic milieu during shift.  Denies SI and contracts fir safety. Patient irritable and verbally aggressive at times during shift. Patient easily redirected and given emotional support. Patient continues to present paranoid about scheduled medications and refused medications several times through out shift.  Patient in no current distress will continue q 15 minute checks during shift.   Problem: Activity: Goal: Risk for activity intolerance will decrease Outcome: Progressing   Problem: Education: Goal: Knowledge of General Education information will improve Description: Including pain rating scale, medication(s)/side effects and non-pharmacologic comfort measures Outcome: Not Progressing   Problem: Health Behavior/Discharge Planning: Goal: Ability to manage health-related needs will improve 04/25/2021 1354 by Theodosia Quay, RN Outcome: Not Progressing 04/25/2021 1354 by Theodosia Quay, RN Outcome: Progressing

## 2021-04-25 NOTE — Progress Notes (Signed)
Patient pleasant thus far this shift. Visible in milieu most of shift. Denies any SI, HI, AVH although noted responding to internal stimuli. Patient continues to be paranoid, did take all medications and requested prns. Encouragement and support provided. Safety checks maintained. Medications given as prescribed. Pt receptive and remains safe on unit with q 15 min checks.

## 2021-04-26 MED ORDER — IBUPROFEN 800 MG PO TABS
800.0000 mg | ORAL_TABLET | Freq: Three times a day (TID) | ORAL | Status: DC | PRN
  Administered 2021-04-26 – 2021-05-05 (×21): 800 mg via ORAL
  Filled 2021-04-26 (×23): qty 1

## 2021-04-26 MED ORDER — RISPERIDONE 0.5 MG PO TBDP
2.0000 mg | ORAL_TABLET | Freq: Two times a day (BID) | ORAL | Status: DC
  Administered 2021-04-26 – 2021-05-04 (×13): 2 mg via ORAL
  Filled 2021-04-26 (×3): qty 4
  Filled 2021-04-26 (×2): qty 2
  Filled 2021-04-26: qty 4
  Filled 2021-04-26: qty 2
  Filled 2021-04-26 (×9): qty 4
  Filled 2021-04-26: qty 2
  Filled 2021-04-26 (×2): qty 4

## 2021-04-26 NOTE — Progress Notes (Signed)
Patient c/o dizziness while standing at telephone for extended period of time.  Assisted patient to chair.  Orthostatic BP checked. Sitting 110/69, P 82, standing 113/74, P 88.  Patient states, "It's gone now. I feel better."  Will continue to monitor.

## 2021-04-26 NOTE — BH IP Treatment Plan (Signed)
Interdisciplinary Treatment and Diagnostic Plan Update  04/26/2021 Time of Session: 8:30AM [name] Long MRN: [id number]  Principal Diagnosis: Schizoaffective disorder [facility])  Secondary Diagnoses: Principal Problem:   Schizoaffective disorder ([place]) Active Problems:   Hypothyroidism   Type 2 diabetes mellitus ([place])   HYPERCHOLESTEROLEMIA   HYPERTENSION, BENIGN SYSTEMIC   Hypokalemia   Atrial fibrillation ([place])   Current Medications:  Current Facility-Administered Medications  Medication Dose Route Frequency Provider Last Rate Last Admin   acetaminophen (TYLENOL) tablet 650 mg  650 mg Oral Q4H PRN Clapacs, Madie Reno, MD   650 mg at 04/24/21 0214   acetaminophen (TYLENOL) tablet 650 mg  650 mg Oral Q6H PRN Clapacs, Madie Reno, MD   650 mg at 04/25/21 2055   albuterol (VENTOLIN HFA) 108 (90 Base) MCG/ACT inhaler 2 puff  2 puff Inhalation QID PRN Clapacs, Madie Reno, MD       alum & mag hydroxide-simeth (MAALOX/MYLANTA) 200-200-20 MG/5ML suspension 30 mL  30 mL Oral Q4H PRN Clapacs, Madie Reno, MD       amLODipine (NORVASC) tablet 5 mg  5 mg Oral Daily Clapacs, Madie Reno, MD   5 mg at 04/26/21 0816   divalproex (DEPAKOTE) DR tablet 500 mg  500 mg Oral Q12H Clapacs, Madie Reno, MD   500 mg at 04/26/21 0093   hydrochlorothiazide (HYDRODIURIL) tablet 25 mg  25 mg Oral Daily Clapacs, Madie Reno, MD   25 mg at 04/26/21 8182   levothyroxine (SYNTHROID) tablet 25 mcg  25 mcg Oral QAC breakfast Clapacs, Madie Reno, MD   25 mcg at 04/26/21 0818   linagliptin (TRADJENTA) tablet 5 mg  5 mg Oral Daily Clapacs, Madie Reno, MD   5 mg at 04/25/21 1117   magnesium hydroxide (MILK OF MAGNESIA) suspension 30 mL  30 mL Oral Daily PRN Clapacs, Madie Reno, MD       metFORMIN (GLUCOPHAGE) tablet 1,000 mg  1,000 mg Oral BID WC Clapacs, John T, MD   1,000 mg at 04/26/21 9937   methocarbamol (ROBAXIN) tablet 500 mg  500 mg Oral TID PRN Rulon Sera, MD   500 mg at 04/24/21 2057   metoprolol succinate (TOPROL-XL) 24 hr tablet 50 mg  50 mg Oral  Daily Clapacs, Madie Reno, MD   50 mg at 04/26/21 0815   OLANZapine (ZYPREXA) injection 5 mg  5 mg Intramuscular BID PRN Clapacs, Madie Reno, MD   5 mg at 04/20/21 1930   pantoprazole (PROTONIX) EC tablet 40 mg  40 mg Oral Daily Clapacs, Madie Reno, MD   40 mg at 04/26/21 0815   potassium chloride (KLOR-CON) CR tablet 10 mEq  10 mEq Oral BID Clapacs, Madie Reno, MD   10 mEq at 04/26/21 0815   risperiDONE (RISPERDAL M-TABS) disintegrating tablet 1 mg  1 mg Oral BID Clapacs, Madie Reno, MD   1 mg at 04/26/21 0902   PTA Medications: Medications Prior to Admission  Medication Sig Dispense Refill Last Dose   albuterol (ACCUNEB) 1.25 MG/3ML nebulizer solution USE 1 VIAL VIA NEBULIZER EVERY 6 HOURS AS NEEDED FOR WHEEZING (Patient taking differently: Take 1 ampule by nebulization every 6 (six) hours as needed for shortness of breath or wheezing.) 75 mL 1    albuterol (VENTOLIN HFA) 108 (90 Base) MCG/ACT inhaler Inhale 2 puffs into the lungs 4 (four) times daily as needed for wheezing or shortness of breath. 18 g 1    amLODipine (NORVASC) 5 MG tablet Take 1 tablet (5 mg total) by mouth daily. [place]  tablet 1    Blood Glucose Monitoring Suppl (FREESTYLE LITE) w/Device KIT 1 each by Does not apply route daily. Dx:E11.9 1 kit 0    Blood Pressure Monitoring (BLOOD PRESSURE MONITOR/WRIST) KIT 1 Units by Does not apply route daily at 6 (six) AM. 1 kit 0    conjugated estrogens (PREMARIN) vaginal cream Place 1 Applicatorful vaginally at bedtime. 42.5 g 3    dapagliflozin propanediol (FARXIGA) 5 MG TABS tablet Take 1 tablet (5 mg total) by mouth daily. (Patient taking differently: Take 5 mg by mouth daily. Daughter states she is no longer taking this) 30 tablet 1    Divalproex Sodium (DEPAKOTE PO) Take 750 mg by mouth at bedtime.   More than a month   glucose blood (ACCU-CHEK GUIDE) test strip USE TO TEST BLOOD SUGAR ONCE DAILY 100 strip 1    glucose blood (FREESTYLE LITE) test strip Use to test blood sugar one daily. Dx: E11.9 100 each 5     glucose blood (ONETOUCH VERIO) test strip Use to test blood sugar 2-3 times daily. Dx: E11.9 100 each 11    glucose blood test strip One Touch Ultra 2 Test Strips Use to test blood sugar once daily. Dx: E11.9 100 each 5    glucose blood test strip Use 2-3 times daily 100 each 12    hydrochlorothiazide (HYDRODIURIL) 25 MG tablet TAKE 1 TABLET(25 MG) BY MOUTH DAILY (Patient taking differently: Take 25 mg by mouth daily.) 30 tablet 3    ibuprofen (ADVIL) 800 MG tablet TAKE 1 TABLET(800 MG) BY MOUTH EVERY 8 HOURS AS NEEDED (Patient taking differently: Take 800 mg by mouth every 8 (eight) hours as needed for headache or moderate pain.) 30 tablet 0    Lancets (FREESTYLE) lancets Use to check blood sugar once daily. Dx: E11.9 100 each 5    levothyroxine (SYNTHROID) 25 MCG tablet TAKE 1 TABLET(25 MCG) BY MOUTH DAILY BEFORE BREAKFAST (Patient taking differently: Take 25 mcg by mouth daily before breakfast.) 30 tablet 3    LORazepam (ATIVAN) 0.5 MG tablet Take 0.5 mg by mouth at bedtime.   More than a month   metFORMIN (GLUCOPHAGE) 1000 MG tablet Take 1 tablet (1,000 mg total) by mouth 2 (two) times daily with a meal. 90 tablet 1    methocarbamol (ROBAXIN) 500 MG tablet Take 1 tablet (500 mg total) by mouth every 6 (six) hours as needed for muscle spasms. 30 tablet 0    metoprolol succinate (TOPROL-XL) 50 MG 24 hr tablet Take 1 tablet (50 mg total) by mouth daily. Take with or immediately following a meal. 30 tablet 3    omeprazole (PRILOSEC) 40 MG capsule Take 1 capsule (40 mg total) by mouth 2 (two) times daily. 60 capsule 1    penicillin v potassium (VEETID) 500 MG tablet Take 1 tablet (500 mg total) by mouth 4 (four) times daily. 120 tablet 0    potassium chloride (KLOR-CON) 10 MEQ tablet Take 1 tablet (10 mEq total) by mouth 2 (two) times daily. 60 tablet 3    potassium chloride SA (KLOR-CON) 20 MEQ tablet Take 2 tablets (40 mEq total) by mouth once for 1 dose. 2 tablet 0    rosuvastatin (CRESTOR) 40  MG tablet Take 1 tablet (40 mg total) by mouth daily. 90 tablet 3    sitaGLIPtin (JANUVIA) 50 MG tablet Take 1 tablet (50 mg total) by mouth daily. 90 tablet 1    sodium chloride 0.9 % infusion Inject into the vein once. 10  ml/hr      sucralfate (CARAFATE) 1 GM/10ML suspension SHAKE LIQUID AND TAKE 10 ML(1 GRAM) BY MOUTH FOUR TIMES DAILY (Patient taking differently: Take 1 g by mouth 4 (four) times daily.) 1200 mL 0     Patient Stressors: Loss of Father and husband 1 year ago    Patient Strengths: Supportive family/friends   Treatment Modalities: Medication Management, Group therapy, Case management,  1 to 1 session with clinician, Psychoeducation, Recreational therapy.   Physician Treatment Plan for Primary Diagnosis: Schizoaffective disorder ([place]) Long Term Goal(s): Improvement in symptoms so as ready for discharge   Short Term Goals: Compliance with prescribed medications will improve Ability to demonstrate self-control will improve Ability to identify and develop effective coping behaviors will improve  Medication Management: Evaluate patient's response, side effects, and tolerance of medication regimen.  Therapeutic Interventions: 1 to 1 sessions, Unit Group sessions and Medication administration.  Evaluation of Outcomes: Progressing  Physician Treatment Plan for Secondary Diagnosis: Principal Problem:   Schizoaffective disorder ([place]) Active Problems:   Hypothyroidism   Type 2 diabetes mellitus ([employer])   HYPERCHOLESTEROLEMIA   HYPERTENSION, BENIGN SYSTEMIC   Hypokalemia   Atrial fibrillation (H[facility])  Long Term Goal(s): Improvement in symptoms so as ready for discharge   Short Term Goals: Compliance with prescribed medications will improve Ability to demonstrate self-control will improve Ability to identify and develop effective coping behaviors will improve     Medication Management: Evaluate patient's response, side effects, and tolerance of medication  regimen.  Therapeutic Interventions: 1 to 1 sessions, Unit Group sessions and Medication administration.  Evaluation of Outcomes: Progressing   RN Treatment Plan for Primary Diagnosis: Schizoaffective disorder ([place]) Long Term Goal(s): Knowledge of disease and therapeutic regimen to maintain health will improve  Short Term Goals: Ability to remain free from injury will improve, Ability to verbalize frustration and anger appropriately will improve, Ability to demonstrate self-control, Ability to participate in decision making will improve, Ability to verbalize feelings will improve, Ability to identify and develop effective coping behaviors will improve, and Compliance with prescribed medications will improve  Medication Management: RN will administer medications as ordered by provider, will assess and evaluate patient's response and provide education to patient for prescribed medication. RN will report any adverse and/or side effects to prescribing provider.  Therapeutic Interventions: 1 on 1 counseling sessions, Psychoeducation, Medication administration, Evaluate responses to treatment, Monitor vital signs and CBGs as ordered, Perform/monitor CIWA, COWS, AIMS and Fall Risk screenings as ordered, Perform wound care treatments as ordered.  Evaluation of Outcomes: Progressing   LCSW Treatment Plan for Primary Diagnosis: Schizoaffective disorder ([place]) Long Term Goal(s): Safe transition to appropriate next level of care at discharge, Engage patient in therapeutic group addressing interpersonal concerns.  Short Term Goals: Engage patient in aftercare planning with referrals and resources, Increase social support, Increase ability to appropriately verbalize feelings, Increase emotional regulation, Facilitate acceptance of mental health diagnosis and concerns, Identify triggers associated with mental health/substance abuse issues, and Increase skills for wellness and recovery  Therapeutic  Interventions: Assess for all discharge needs, 1 to 1 time with Social worker, Explore available resources and support systems, Assess for adequacy in community support network, Educate family and significant other(s) on suicide prevention, Complete Psychosocial Assessment, Interpersonal group therapy.  Evaluation of Outcomes: Progressing   Progress in Treatment: Attending groups: No. Participating in groups: No. Taking medication as prescribed: Yes. Toleration medication: Yes. Family/Significant other contact made: Yes, individual(s) contacted:  pt's daughter/guardian Shelda Pal Patient understands diagnosis: No. Discussing  patient identified problems/goals with staff: No. Medical problems stabilized or resolved: Yes. Denies suicidal/homicidal ideation: Yes. Issues/concerns per patient self-inventory: No. Other: None  New problem(s) identified: No, Describe:  None  New Short Term/Long Term Goal(s):  elimination of symptoms of psychosis, medication management for mood stabilization; elimination of SI thoughts; development of comprehensive mental wellness plan. Update 04/26/21: No changes at this time.     Patient Goals:  "took the Risperdal shots, now I'm ready to go"  Update 04/26/21: No changes at this time.     Discharge Plan or Barriers: CSW will assist pt with development of appropriate discharge/aftercare plan. Update 04/26/21: CSW spoke with pt's guardian Swetha Rayle who stated that pt can return to her home, however pt's lease is up in December and will need a change is housing. CSW suggested guardian reach out to [facility] for any housing alternative referrals.   Reason for Continuation of Hospitalization: Aggression Delusions  Medication stabilization  Estimated Length of Stay: TBD   Scribe for Treatment Team: Sostenes Kauffmann A [name], [place] 04/26/2021 10:03 AM

## 2021-04-26 NOTE — Progress Notes (Signed)
[facility] MD Progress Note  04/26/2021 4:15 PM [name] Long  MRN:  [id number] Subjective: Follow-up 70 year old woman with history of schizoaffective disorder.  Patient seen and chart reviewed.  Patient immediately began cursing at me and talking in a disorganized manner about people trying to do things to her body.  Disorganized thinking.  Very paranoid and appeared to be psychotic.  Patient has been for the most part taking medicines although sometimes still will idiosyncratically skipped them.  She has not been physically aggressive on the ward. Principal Problem: Schizoaffective disorder ([place]) Diagnosis: Principal Problem:   Schizoaffective disorder ([place]) Active Problems:   Hypothyroidism   Type 2 diabetes mellitus (HCC)   HYPERCHOLESTEROLEMIA   HYPERTENSION, BENIGN SYSTEMIC   Hypokalemia   Atrial fibrillation ([facility])  Total Time spent with patient: 30 minutes  Past Psychiatric History: Past history of chronic psychotic disorder with multiple flareups and hospitalizations  Past Medical History:  Past Medical History:  Diagnosis Date   Abscessed tooth 02/09/2020   Acute on chronic congestive heart failure ([place])    Adenomatous colon polyp    Arthritis    "real bad; all over" (07/12/2017)   Asthma    Atrial fibrillation ([place])    BIPOLAR DISORDER 08/31/2006   Qualifier: Diagnosis of  By: Dorathy Daft MD, Marjory Lies     CHRONIC KIDNEY DISEASE STAGE II (MILD) 09/14/2009   Annotation: eGFR 96 Qualifier: Diagnosis of  By: Jess Barters MD, Erik     Chronic lower back pain    Congestive heart failure (CHF) (HCC)    COPD (chronic obstructive pulmonary disease) ([place]) 09/23/2010   Diagnosed at [facility] in 2008 (Dr. Annamaria Boots)    DM (diabetes mellitus) type II controlled with renal manifestation ([place]) 08/31/2006   Qualifier: Diagnosis of  By: Dorathy Daft MD, Marjory Lies     Dyspnea    "all my life; since 6th grade" (07/12/2017)   Fatty liver    HYPERCHOLESTEROLEMIA 08/31/2006   Intolerance to Lipitor OK on Crestor but  medicaid no longer covering     HYPERTENSION, BENIGN SYSTEMIC 08/31/2006   Qualifier: Diagnosis of  By: Dorathy Daft MD, Aaron     Hypokalemia    HYPOTHYROIDISM, UNSPECIFIED 08/31/2006   Qualifier: Diagnosis of  By: Dorathy Daft MD, Marjory Lies     Leg swelling 03/14/2018   Pneumonia    "3 times" (07/12/2017)   Pulmonary nodule    Renal cyst    Schizophrenia ([place])    Scoliosis    Stomach problems    Thyroid disorder     Past Surgical History:  Procedure Laterality Date   CESAREAN SECTION     FOOT FRACTURE SURGERY Right    "steel plate in it"   FOOT SURGERY     " born w/dislocated foot"   FRACTURE SURGERY     KNEE ARTHROSCOPY Right    TOE SURGERY Bilateral    "both pinky toes"   TONSILLECTOMY AND ADENOIDECTOMY     Family History:  Family History  Problem Relation Age of Onset   Breast cancer Sister    Heart disease Mother 67   Rectal cancer Mother    Diabetes Father 41   Hypertension Father    Heart disease Brother    Asthma Daughter    Asthma Son    Breast cancer Sister    Asthma Daughter    Colon cancer Maternal Grandmother    Stomach cancer Neg Hx    Esophageal cancer Neg Hx    Pancreatic cancer Neg Hx    Family Psychiatric  History: See previous Social History:  Social History   Substance and Sexual Activity  Alcohol Use Not Currently   Comment: 07/12/2017 "stopped 2 yr ago; did have a drink over the holidays recently"     Social History   Substance and Sexual Activity  Drug Use No    Social History   Socioeconomic History   Marital status: Divorced    Spouse name: Not on file   Number of children: 4   Years of education: 12   Highest education level: High school graduate  Occupational History   Not on file  Tobacco Use   Smoking status: Former    Packs/day: 0.25    Years: 45.00    Pack years: 11.25    Types: Cigarettes   Smokeless tobacco: Never   Tobacco comments:    patient states she smokes 1 cigarette per month  Vaping Use   Vaping Use: Never used   Substance and Sexual Activity   Alcohol use: Not Currently    Comment: 07/12/2017 "stopped 2 yr ago; did have a drink over the holidays recently"   Drug use: No   Sexual activity: Not Currently  Other Topics Concern   Not on file  Social History Narrative   Patient lives alone in [place].    Patient does not drive, she uses insurance transportation or her children.   Patient enjoys twirling her paton, listening to music, dancing, and spending time with family.    Patient enjoys celebrating all holidays and birthdays of loved ones.   Social Determinants of Health   Financial Resource Strain: Not on file  Food Insecurity: Not on file  Transportation Needs: Not on file  Physical Activity: Not on file  Stress: Not on file  Social Connections: Not on file   Additional Social History:                         Sleep: Fair  Appetite:  Fair  Current Medications: Current Facility-Administered Medications  Medication Dose Route Frequency Provider Last Rate Last Admin   acetaminophen (TYLENOL) tablet 650 mg  650 mg Oral Q4H PRN ,  T, MD   650 mg at 04/24/21 0214   acetaminophen (TYLENOL) tablet 650 mg  650 mg Oral Q6H PRN ,  T, MD   650 mg at 04/25/21 2055   albuterol (VENTOLIN HFA) 108 (90 Base) MCG/ACT inhaler 2 puff  2 puff Inhalation QID PRN , Madie Reno, MD       alum & mag hydroxide-simeth (MAALOX/MYLANTA) 200-200-20 MG/5ML suspension 30 mL  30 mL Oral Q4H PRN ,  T, MD   30 mL at 04/26/21 1331   amLODipine (NORVASC) tablet 5 mg  5 mg Oral Daily ,  T, MD   5 mg at 04/26/21 0816   divalproex (DEPAKOTE) DR tablet 500 mg  500 mg Oral Q12H ,  T, MD   500 mg at 04/26/21 0454   hydrochlorothiazide (HYDRODIURIL) tablet 25 mg  25 mg Oral Daily ,  T, MD   25 mg at 04/26/21 0816   ibuprofen (ADVIL) tablet 800 mg  800 mg Oral Q8H PRN ,  T, MD   800 mg at 04/26/21 1328   levothyroxine (SYNTHROID) tablet  25 mcg  25 mcg Oral QAC breakfast , Madie Reno, MD   25 mcg at 04/26/21 0818   linagliptin (TRADJENTA) tablet 5 mg  5 mg Oral Daily , Madie Reno, MD   5 mg at 04/25/21  1117   magnesium hydroxide (MILK OF MAGNESIA) suspension 30 mL  30 mL Oral Daily PRN ,  T, MD       metFORMIN (GLUCOPHAGE) tablet 1,000 mg  1,000 mg Oral BID WC ,  T, MD   1,000 mg at 04/26/21 2951   methocarbamol (ROBAXIN) tablet 500 mg  500 mg Oral TID PRN Rulon Sera, MD   500 mg at 04/24/21 2057   metoprolol succinate (TOPROL-XL) 24 hr tablet 50 mg  50 mg Oral Daily ,  T, MD   50 mg at 04/26/21 0815   OLANZapine (ZYPREXA) injection 5 mg  5 mg Intramuscular BID PRN ,  T, MD   5 mg at 04/20/21 1930   pantoprazole (PROTONIX) EC tablet 40 mg  40 mg Oral Daily ,  T, MD   40 mg at 04/26/21 0815   potassium chloride (KLOR-CON) CR tablet 10 mEq  10 mEq Oral BID ,  T, MD   10 mEq at 04/26/21 0815   risperiDONE (RISPERDAL M-TABS) disintegrating tablet 2 mg  2 mg Oral BID , Madie Reno, MD        Lab Results: No results found. However, due to the size of the patient record, not all encounters were searched. Please check Results Review for a complete set of results.  Blood Alcohol level:  Lab Results  Component Value Date   ETH <10 04/17/2021   ETH <10 88/41/6606    Metabolic Disorder Labs: Lab Results  Component Value Date   HGBA1C 8.3 (H) 04/19/2021   MPG 192 04/19/2021   MPG 200 10/01/2020   No results found for: PROLACTIN Lab Results  Component Value Date   CHOL 166 04/19/2021   TRIG 213 (H) 04/19/2021   HDL 43 04/19/2021   CHOLHDL 3.9 04/19/2021   VLDL 43 (H) 04/19/2021   LDLCALC 80 04/19/2021   LDLCALC 180 (H) 10/01/2020    Physical Findings: AIMS:  , ,  ,  ,    CIWA:    COWS:     Musculoskeletal: Strength & Muscle Tone: within normal limits Gait & Station: normal Patient leans: N/A  Psychiatric Specialty Exam:  Presentation   General Appearance: No data recorded Eye Contact:No data recorded Speech:No data recorded Speech Volume:No data recorded Handedness:No data recorded  Mood and Affect  Mood:No data recorded Affect:No data recorded  Thought Process  Thought Processes:No data recorded Descriptions of Associations:No data recorded Orientation:No data recorded Thought Content:No data recorded History of Schizophrenia/Schizoaffective disorder:Yes  Duration of Psychotic Symptoms:Greater than six months  Hallucinations:No data recorded Ideas of Reference:No data recorded Suicidal Thoughts:No data recorded Homicidal Thoughts:No data recorded  Sensorium  Memory:No data recorded Judgment:No data recorded Insight:No data recorded  Executive Functions  Concentration:No data recorded Attention Span:No data recorded Recall:No data recorded Fund of Knowledge:No data recorded Language:No data recorded  Psychomotor Activity  Psychomotor Activity:No data recorded  Assets  Assets:No data recorded  Sleep  Sleep:No data recorded   Physical Exam: Physical Exam Vitals and nursing note reviewed.  Constitutional:      Appearance: Normal appearance.  HENT:     Head: Normocephalic and atraumatic.     Mouth/Throat:     Pharynx: Oropharynx is clear.  Eyes:     Pupils: Pupils are equal, round, and reactive to light.  Cardiovascular:     Rate and Rhythm: Normal rate and regular rhythm.  Pulmonary:     Effort: Pulmonary effort is normal.     Breath sounds: Normal breath sounds.  Abdominal:     General: Abdomen is flat.     Palpations: Abdomen is soft.  Musculoskeletal:        General: Normal range of motion.  Skin:    General: Skin is warm and dry.  Neurological:     General: No focal deficit present.     Mental Status: She is alert. Mental status is at baseline.  Psychiatric:        Attention and Perception: She is inattentive.        Mood and Affect: Mood normal. Affect is inappropriate.         Speech: Speech is tangential.        Behavior: Behavior is agitated.        Thought Content: Thought content is paranoid and delusional.        Cognition and Memory: Cognition is impaired. Memory is impaired.        Judgment: Judgment is inappropriate.   Review of Systems  Constitutional: Negative.   HENT: Negative.    Eyes: Negative.   Respiratory: Negative.    Cardiovascular: Negative.   Gastrointestinal: Negative.   Musculoskeletal: Negative.   Skin: Negative.   Neurological: Negative.   Psychiatric/Behavioral: Negative.    Blood pressure 113/74, pulse 86, temperature 98 F (36.7 C), temperature source Oral, resp. rate 16, SpO2 100 %. There is no height or weight on file to calculate BMI.   Treatment Plan Summary: Medication management and Plan patient remains psychotic difficult to talk to agitated.  With other peers she is behaving herself and she takes care of her ADLs adequately.  Medically stabilizing.  Nevertheless still very disorganized.  I am increasing the dose of her Risperdal to 2 mg twice a day today.  I will try and contact family to update them and get any information from them about discharge planning.  Alethia Berthold, MD 04/26/2021, 4:15 PM
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
## 2021-04-26 NOTE — Progress Notes (Signed)
Observed pt in dining room responding to internal stimuli talking in an irritated manner and gesturing with her hands. When addressed patient her affect changes and she is pleasant and compliant with evening meds.  Denies needs or complaints at this time.  Will continue Q 15 min safety checks.
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
## 2021-04-27 ENCOUNTER — Telehealth: Payer: Self-pay | Admitting: Orthopedic Surgery

## 2021-04-27 LAB — GLUCOSE, CAPILLARY: Glucose-Capillary: 190 mg/dL — ABNORMAL HIGH (ref 70–99)

## 2021-04-27 NOTE — Progress Notes (Signed)
Pt seen on initial a.m. assessment. Pt in day room watching tv, still complains of L shoulder pain 6/10 PRN given. Breakfast tray served to patient.
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
## 2021-04-27 NOTE — Plan of Care (Signed)
  Problem: Education: Goal: Knowledge of General Education information will improve Description: Including pain rating scale, medication(s)/side effects and non-pharmacologic comfort measures Outcome: Progressing   Problem: Coping: Goal: Level of anxiety will decrease Outcome: Progressing   Problem: Safety: Goal: Ability to remain free from injury will improve Outcome: Progressing   

## 2021-04-27 NOTE — Telephone Encounter (Signed)
Multiple phone calls from pt. She is currently admitted to behavioral health and calls several times a week asking for Dr. Sharol Given to " get her out" I was instructed to note that the message was received and if there was an orthopedic issue a consult would be called. Just wanting to make sure that this protocol should continue. Thanks

## 2021-04-27 NOTE — Progress Notes (Signed)
Hospital meal provided, pt tolerated w/o complaints.  Waste discarded appropriately.  

## 2021-04-27 NOTE — Progress Notes (Signed)
Recreation Therapy Notes  Date: 04/27/2021  Time: 1:30 pm  Location: Craft room    Behavioral response: N/A   Intervention Topic: Creative expressions  Discussion/Intervention: Patient did not attend group.   Clinical Observations/Feedback:  Patient invited to group; patient declined   Seiji Wiswell LRT/CTRS         Idrees Quam 04/27/2021 2:59 PM

## 2021-04-27 NOTE — Group Note (Signed)
[facility] LCSW Group Therapy Note   Group Date: 04/27/2021 Start Time: 5366 End Time: 1400  Type of Therapy/Topic:  Group Therapy:  Feelings about Diagnosis  Participation Level:  Did Not Attend      Description of Group:    This group will allow patients to explore their thoughts and feelings about diagnoses they have received. Patients will be guided to explore their level of understanding and acceptance of these diagnoses. Facilitator will encourage patients to process their thoughts and feelings about the reactions of others to their diagnosis, and will guide patients in identifying ways to discuss their diagnosis with significant others in their lives. This group will be process-oriented, with patients participating in exploration of their own experiences as well as giving and receiving support and challenge from other group members.   Therapeutic Goals: 1. Patient will demonstrate understanding of diagnosis as evidence by identifying two or more symptoms of the disorder:  2. Patient will be able to express two feelings regarding the diagnosis 3. Patient will demonstrate ability to communicate their needs through discussion and/or role plays  Summary of Patient Progress:  X    Therapeutic Modalities:   Cognitive Behavioral Therapy Brief Therapy Feelings Identification    [name] Long, LCSWA

## 2021-04-27 NOTE — Progress Notes (Signed)
Pt received another dose of albuterol inhaler at about 0441 for shortness of breath, effective on follow up. She has been in the the day area intermittently overnight. She is watching TV at this time, being monitored as ordered.
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
## 2021-04-27 NOTE — Plan of Care (Signed)
  Problem: Activity: Goal: Risk for activity intolerance will decrease Outcome: Progressing   Problem: Coping: Goal: Level of anxiety will decrease Outcome: Progressing   Problem: Safety: Goal: Ability to remain free from injury will improve Outcome: Progressing   

## 2021-04-27 NOTE — Progress Notes (Signed)
   04/27/21 1545  Clinical Encounter Type  Visited With Patient  Visit Type Initial;Spiritual support;Social support  Referral From Other (Comment) (rounding)  Chaplain Burris met Pt while rounding on unit. Chaplain Burris provided active listening and compassionate presence. Visit seemed to be welcomed and Pt very talkative and alert. Will continue to follow as needed.
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
## 2021-04-27 NOTE — Telephone Encounter (Signed)
Received call from patient advised she is in [facility]) and want to know if Dr. Sharol Given can get her out. Patient said she do not have any mental health issues. Patient asked if Dr Sharol Given would contact the doctor there and let him know she is ok.  Patient said she can use the phone again at 11:30am and will call back.

## 2021-04-27 NOTE — Progress Notes (Signed)
[facility] MD Progress Note  04/27/2021 2:48 PM [name] Long  MRN:  [id number] Subjective: Follow-up 70 year old woman with schizoaffective disorder.  Patient seen and chart reviewed.  Patient continues to be disorganized and paranoid.  She will ramble on at indefinite length with the topic changing every couple seconds.  Talks about various members of her family who she thinks are against her in ways.  All of it comes across as very bizarre.  Contradicts herself frequently.  Patient has not been physically aggressive or violent on the unit.  Takes care of her ADLs well.  I spoke with her daughter yesterday who confirmed that the patient's psychosis had gotten out of hand and is prohibitive in terms of her living alone. Principal Problem: Schizoaffective disorder ([place]) Diagnosis: Principal Problem:   Schizoaffective disorder ([place]) Active Problems:   Hypothyroidism   Type 2 diabetes mellitus ([place])   HYPERCHOLESTEROLEMIA   HYPERTENSION, BENIGN SYSTEMIC   Hypokalemia   Atrial fibrillation (HCC)  Total Time spent with patient: 30 minutes  Past Psychiatric History: Past history of schizoaffective disorder multiple prior hospitalizations  Past Medical History:  Past Medical History:  Diagnosis Date   Abscessed tooth 02/09/2020   Acute on chronic congestive heart failure ([place])    Adenomatous colon polyp    Arthritis    "real bad; all over" (07/12/2017)   Asthma    Atrial fibrillation ([place])    BIPOLAR DISORDER 08/31/2006   Qualifier: Diagnosis of  By: Dorathy Daft MD, Marjory Lies     CHRONIC KIDNEY DISEASE STAGE II (MILD) 09/14/2009   Annotation: eGFR 20 Qualifier: Diagnosis of  By: Jess Barters MD, Erik     Chronic lower back pain    Congestive heart failure (CHF) (HCC)    COPD (chronic obstructive pulmonary disease) ([place]) 09/23/2010   Diagnosed at [facility] in 2008 (Dr. Annamaria Boots)    DM (diabetes mellitus) type II controlled with renal manifestation ([place]) 08/31/2006   Qualifier: Diagnosis of  By: Dorathy Daft MD,  Marjory Lies     Dyspnea    "all my life; since 6th grade" (07/12/2017)   Fatty liver    HYPERCHOLESTEROLEMIA 08/31/2006   Intolerance to Lipitor OK on Crestor but medicaid no longer covering     HYPERTENSION, BENIGN SYSTEMIC 08/31/2006   Qualifier: Diagnosis of  By: Dorathy Daft MD, Aaron     Hypokalemia    HYPOTHYROIDISM, UNSPECIFIED 08/31/2006   Qualifier: Diagnosis of  By: Dorathy Daft MD, Marjory Lies     Leg swelling 03/14/2018   Pneumonia    "3 times" (07/12/2017)   Pulmonary nodule    Renal cyst    Schizophrenia ([place])    Scoliosis    Stomach problems    Thyroid disorder     Past Surgical History:  Procedure Laterality Date   CESAREAN SECTION     FOOT FRACTURE SURGERY Right    "steel plate in it"   FOOT SURGERY     " born w/dislocated foot"   FRACTURE SURGERY     KNEE ARTHROSCOPY Right    TOE SURGERY Bilateral    "both pinky toes"   TONSILLECTOMY AND ADENOIDECTOMY     Family History:  Family History  Problem Relation Age of Onset   Breast cancer Sister    Heart disease Mother 71   Rectal cancer Mother    Diabetes Father 59   Hypertension Father    Heart disease Brother    Asthma Daughter    Asthma Son    Breast cancer Sister    Asthma Daughter  Colon cancer Maternal Grandmother    Stomach cancer Neg Hx    Esophageal cancer Neg Hx    Pancreatic cancer Neg Hx    Family Psychiatric  History: See previous Social History:  Social History   Substance and Sexual Activity  Alcohol Use Not Currently   Comment: 07/12/2017 "stopped 2 yr ago; did have a drink over the holidays recently"     Social History   Substance and Sexual Activity  Drug Use No    Social History   Socioeconomic History   Marital status: Divorced    Spouse name: Not on file   Number of children: 4   Years of education: 12   Highest education level: High school graduate  Occupational History   Not on file  Tobacco Use   Smoking status: Former    Packs/day: 0.25    Years: 45.00    Pack years: 11.25     Types: Cigarettes   Smokeless tobacco: Never   Tobacco comments:    patient states she smokes 1 cigarette per month  Vaping Use   Vaping Use: Never used  Substance and Sexual Activity   Alcohol use: Not Currently    Comment: 07/12/2017 "stopped 2 yr ago; did have a drink over the holidays recently"   Drug use: No   Sexual activity: Not Currently  Other Topics Concern   Not on file  Social History Narrative   Patient lives alone in [place].    Patient does not drive, she uses insurance transportation or her children.   Patient enjoys twirling her paton, listening to music, dancing, and spending time with family.    Patient enjoys celebrating all holidays and birthdays of loved ones.   Social Determinants of Health   Financial Resource Strain: Not on file  Food Insecurity: Not on file  Transportation Needs: Not on file  Physical Activity: Not on file  Stress: Not on file  Social Connections: Not on file   Additional Social History:                         Sleep: Fair  Appetite:  Fair  Current Medications: Current Facility-Administered Medications  Medication Dose Route Frequency Provider Last Rate Last Admin   acetaminophen (TYLENOL) tablet 650 mg  650 mg Oral Q4H PRN Toryn Mcclinton T, MD   650 mg at 04/24/21 0214   acetaminophen (TYLENOL) tablet 650 mg  650 mg Oral Q6H PRN Bradford Cazier T, MD   650 mg at 04/25/21 2055   albuterol (VENTOLIN HFA) 108 (90 Base) MCG/ACT inhaler 2 puff  2 puff Inhalation QID PRN Shannen Vernon T, MD   2 puff at 04/27/21 0441   alum & mag hydroxide-simeth (MAALOX/MYLANTA) 200-200-20 MG/5ML suspension 30 mL  30 mL Oral Q4H PRN Ayelen Sciortino T, MD   30 mL at 04/26/21 1331   amLODipine (NORVASC) tablet 5 mg  5 mg Oral Daily Draxton Luu T, MD   5 mg at 04/27/21 0902   divalproex (DEPAKOTE) DR tablet 500 mg  500 mg Oral Q12H Darwin Guastella T, MD   500 mg at 04/27/21 0902   hydrochlorothiazide (HYDRODIURIL) tablet 25 mg  25 mg Oral Daily  Rilie Glanz T, MD   25 mg at 04/27/21 0902   ibuprofen (ADVIL) tablet 800 mg  800 mg Oral Q8H PRN Jamesina Gaugh T, MD   800 mg at 04/27/21 0902   levothyroxine (SYNTHROID) tablet 25 mcg  25 mcg Oral QAC  breakfast Dior Dominik, Madie Reno, MD   25 mcg at 04/27/21 8032   magnesium hydroxide (MILK OF MAGNESIA) suspension 30 mL  30 mL Oral Daily PRN Aileene Lanum T, MD       metFORMIN (GLUCOPHAGE) tablet 1,000 mg  1,000 mg Oral BID WC Orabelle Rylee T, MD   1,000 mg at 04/27/21 1224   methocarbamol (ROBAXIN) tablet 500 mg  500 mg Oral TID PRN Rulon Sera, MD   500 mg at 04/27/21 1408   metoprolol succinate (TOPROL-XL) 24 hr tablet 50 mg  50 mg Oral Daily Aneta Hendershott T, MD   50 mg at 04/27/21 0903   OLANZapine (ZYPREXA) injection 5 mg  5 mg Intramuscular BID PRN Kasmira Cacioppo T, MD   5 mg at 04/20/21 1930   pantoprazole (PROTONIX) EC tablet 40 mg  40 mg Oral Daily Ravi Tuccillo T, MD   40 mg at 04/27/21 0902   potassium chloride (KLOR-CON) CR tablet 10 mEq  10 mEq Oral BID Cimberly Stoffel T, MD   10 mEq at 04/27/21 0902   risperiDONE (RISPERDAL M-TABS) disintegrating tablet 2 mg  2 mg Oral BID Kasiah Manka, Madie Reno, MD   2 mg at 04/27/21 0902    Lab Results: No results found. However, due to the size of the patient record, not all encounters were searched. Please check Results Review for a complete set of results.  Blood Alcohol level:  Lab Results  Component Value Date   ETH <10 04/17/2021   ETH <10 82/50/0370    Metabolic Disorder Labs: Lab Results  Component Value Date   HGBA1C 8.3 (H) 04/19/2021   MPG 192 04/19/2021   MPG 200 10/01/2020   No results found for: PROLACTIN Lab Results  Component Value Date   CHOL 166 04/19/2021   TRIG 213 (H) 04/19/2021   HDL 43 04/19/2021   CHOLHDL 3.9 04/19/2021   VLDL 43 (H) 04/19/2021   LDLCALC 80 04/19/2021   LDLCALC 180 (H) 10/01/2020    Physical Findings: AIMS:  , ,  ,  ,    CIWA:    COWS:     Musculoskeletal: Strength & Muscle Tone: within  normal limits Gait & Station: normal Patient leans: N/A  Psychiatric Specialty Exam:  Presentation  General Appearance: No data recorded Eye Contact:No data recorded Speech:No data recorded Speech Volume:No data recorded Handedness:No data recorded  Mood and Affect  Mood:No data recorded Affect:No data recorded  Thought Process  Thought Processes:No data recorded Descriptions of Associations:No data recorded Orientation:No data recorded Thought Content:No data recorded History of Schizophrenia/Schizoaffective disorder:Yes  Duration of Psychotic Symptoms:Greater than six months  Hallucinations:No data recorded Ideas of Reference:No data recorded Suicidal Thoughts:No data recorded Homicidal Thoughts:No data recorded  Sensorium  Memory:No data recorded Judgment:No data recorded Insight:No data recorded  Executive Functions  Concentration:No data recorded Attention Span:No data recorded Recall:No data recorded Fund of Knowledge:No data recorded Language:No data recorded  Psychomotor Activity  Psychomotor Activity:No data recorded  Assets  Assets:No data recorded  Sleep  Sleep:No data recorded   Physical Exam: Physical Exam Vitals and nursing note reviewed.  Constitutional:      Appearance: Normal appearance.  HENT:     Head: Normocephalic and atraumatic.     Mouth/Throat:     Pharynx: Oropharynx is clear.  Eyes:     Pupils: Pupils are equal, round, and reactive to light.  Cardiovascular:     Rate and Rhythm: Normal rate and regular rhythm.  Pulmonary:     Effort:  Pulmonary effort is normal.     Breath sounds: Normal breath sounds.  Abdominal:     General: Abdomen is flat.     Palpations: Abdomen is soft.  Musculoskeletal:        General: Normal range of motion.  Skin:    General: Skin is warm and dry.  Neurological:     General: No focal deficit present.     Mental Status: She is alert. Mental status is at baseline.  Psychiatric:         Attention and Perception: She is inattentive.        Mood and Affect: Mood normal. Affect is inappropriate.        Speech: Speech is rapid and pressured and tangential.        Behavior: Behavior is agitated. Behavior is not aggressive.        Thought Content: Thought content is paranoid and delusional.        Cognition and Memory: Cognition is impaired.   Review of Systems  Constitutional: Negative.   HENT: Negative.    Eyes: Negative.   Respiratory: Negative.    Cardiovascular: Negative.   Gastrointestinal: Negative.   Musculoskeletal:  Positive for joint pain.  Skin: Negative.   Neurological: Negative.   Psychiatric/Behavioral: Negative.    Blood pressure 124/71, pulse 79, temperature (!) 97.5 F (36.4 C), temperature source Oral, resp. rate 18, SpO2 100 %. There is no height or weight on file to calculate BMI.   Treatment Plan Summary: Medication management and Plan I suggested to the patient that we change her antipsychotic to [facility] in hopes that she would be able to transfer to the long-acting injectable.  No surprise but the patient is very paranoid about any changes to medicine and declined that.  She is only somewhat grudgingly taking the Risperdal anyway.  We will continue with it and the dose is increased to 2 mg twice a day.  Probably another day or 2 to check a Depakote level.  Patient still has no insight at all and cannot really be reasoned with about mental health issues.  Additionally the patient is paranoid about taking Tradjenta.  Explained to the patient and her daughter that it is the hospital substitute for Januvia.  Nevertheless because the patient is unsettled by it we will stop it.  Her blood sugars have been reasonably controlled.  Alethia Berthold, MD 04/27/2021, 2:48 PM

## 2021-04-27 NOTE — Progress Notes (Signed)
Pt on phone with her daughter who is also her guardian; Ms Shartzer stated that she "wants to get out of here, this is a mental hospital and I (she) don't have anything wrong with my mind."  Pt  became agitated then hung up the phone on daughter.  Pt went back into dayroom and resumed eating breakfast.

## 2021-04-28 ENCOUNTER — Telehealth: Payer: Self-pay

## 2021-04-28 DIAGNOSIS — F250 Schizoaffective disorder, bipolar type: Secondary | ICD-10-CM

## 2021-04-28 MED ORDER — PALIPERIDONE PALMITATE ER 234 MG/1.5ML IM SUSY
234.0000 mg | PREFILLED_SYRINGE | Freq: Once | INTRAMUSCULAR | Status: AC
  Administered 2021-04-28: 234 mg via INTRAMUSCULAR
  Filled 2021-04-28: qty 1.5

## 2021-04-28 NOTE — Progress Notes (Signed)
Pt standing at nurses' station talking with staff. Pt states "I feel great. At first when they gave me that shot, my organs that I had surgery on when numb." Pt denies pain, then states "I'm starting to hurt in the pit of my back because they're making me clean up after myself." She rubbed her low back but stated "in my pelvis" when asked where the pit of her back is; she rates pain 5/10 on 0-10 pain scale. Pt declined offer for pain medication. Pt denies SI/HI/AVH at this time. Pt describes her appetite as "alright" and states that she has "slight" diarrhea from drinking "regular" milk this morning. Pt reports that she slept "all night" last night and says that "they" claimed she did not sleep that much last night; she said that "they" were "haters". Pt denies depression and anxiety at this time. No acute distress noted.

## 2021-04-28 NOTE — Progress Notes (Signed)
[facility] MD Progress Note  04/28/2021 10:47 AM [name] Long  MRN:  [id number]  CC "I will leave by police order today."  Subjective:  : A 70 year old woman transferred from [place].  Brought to the hospital with paranoia and reports by her family that she had been threatening to family members had been off her medicine and had not been taking care of herself. No acute events overnight, compliant with medications though suspicious, ADLs intact. Patient seen this morning. She refuses to speak in a private one-on-one area. She feels that the hospital is conspiring against her to steal her belongings, not feeding her well, and trying to take photos of her naked body. She asks for me to call her "real doctor" to sign her out, otherwise she would be leavings with [facility] police. She is showing fair recent memory. She is able to recall discussion with Dr. Weber Cooks about [name] Bender injection, and asks for this by name today. She denies SI/HI/AH/VH. However, is clearly psychotic and delusional on exam.   Principal Problem: Schizoaffective disorder ([place]) Diagnosis: Principal Problem:   Schizoaffective disorder ([place]) Active Problems:   Hypothyroidism   Type 2 diabetes mellitus ([place])   HYPERCHOLESTEROLEMIA   HYPERTENSION, BENIGN SYSTEMIC   Hypokalemia   Atrial fibrillation ([place])  Total Time spent with patient: 30 minutes  Past Psychiatric History: See H&P  Past Medical History:  Past Medical History:  Diagnosis Date   Abscessed tooth 02/09/2020   Acute on chronic congestive heart failure ([place])    Adenomatous colon polyp    Arthritis    "real bad; all over" (07/12/2017)   Asthma    Atrial fibrillation ([place])    BIPOLAR DISORDER 08/31/2006   Qualifier: Diagnosis of  By: Dorathy Daft MD, Marjory Lies     CHRONIC KIDNEY DISEASE STAGE II (MILD) 09/14/2009   Annotation: eGFR 9 Qualifier: Diagnosis of  By: Jess Barters MD, Erik     Chronic lower back pain    Congestive heart failure (CHF) (HCC)    COPD (chronic  obstructive pulmonary disease) ([place]) 09/23/2010   Diagnosed at [facility] in 2008 (Dr. Annamaria Boots)    DM (diabetes mellitus) type II controlled with renal manifestation ([place]) 08/31/2006   Qualifier: Diagnosis of  By: Dorathy Daft MD, Marjory Lies     Dyspnea    "all my life; since 6th grade" (07/12/2017)   Fatty liver    HYPERCHOLESTEROLEMIA 08/31/2006   Intolerance to Lipitor OK on Crestor but medicaid no longer covering     HYPERTENSION, BENIGN SYSTEMIC 08/31/2006   Qualifier: Diagnosis of  By: Dorathy Daft MD, Aaron     Hypokalemia    HYPOTHYROIDISM, UNSPECIFIED 08/31/2006   Qualifier: Diagnosis of  By: Dorathy Daft MD, Marjory Lies     Leg swelling 03/14/2018   Pneumonia    "3 times" (07/12/2017)   Pulmonary nodule    Renal cyst    Schizophrenia ([place])    Scoliosis    Stomach problems    Thyroid disorder     Past Surgical History:  Procedure Laterality Date   CESAREAN SECTION     FOOT FRACTURE SURGERY Right    "steel plate in it"   FOOT SURGERY     " born w/dislocated foot"   FRACTURE SURGERY     KNEE ARTHROSCOPY Right    TOE SURGERY Bilateral    "both pinky toes"   TONSILLECTOMY AND ADENOIDECTOMY     Family History:  Family History  Problem Relation Age of Onset   Breast cancer Sister    Heart disease Mother  68   Rectal cancer Mother    Diabetes Father 65   Hypertension Father    Heart disease Brother    Asthma Daughter    Asthma Son    Breast cancer Sister    Asthma Daughter    Colon cancer Maternal Grandmother    Stomach cancer Neg Hx    Esophageal cancer Neg Hx    Pancreatic cancer Neg Hx    Family Psychiatric  History: See H&P Social History:  Social History   Substance and Sexual Activity  Alcohol Use Not Currently   Comment: 07/12/2017 "stopped 2 yr ago; did have a drink over the holidays recently"     Social History   Substance and Sexual Activity  Drug Use No    Social History   Socioeconomic History   Marital status: Divorced    Spouse name: Not on file   Number of  children: 4   Years of education: 12   Highest education level: High school graduate  Occupational History   Not on file  Tobacco Use   Smoking status: Former    Packs/day: 0.25    Years: 45.00    Pack years: 11.25    Types: Cigarettes   Smokeless tobacco: Never   Tobacco comments:    patient states she smokes 1 cigarette per month  Vaping Use   Vaping Use: Never used  Substance and Sexual Activity   Alcohol use: Not Currently    Comment: 07/12/2017 "stopped 2 yr ago; did have a drink over the holidays recently"   Drug use: No   Sexual activity: Not Currently  Other Topics Concern   Not on file  Social History Narrative   Patient lives alone in [place].    Patient does not drive, she uses insurance transportation or her children.   Patient enjoys twirling her paton, listening to music, dancing, and spending time with family.    Patient enjoys celebrating all holidays and birthdays of loved ones.   Social Determinants of Health   Financial Resource Strain: Not on file  Food Insecurity: Not on file  Transportation Needs: Not on file  Physical Activity: Not on file  Stress: Not on file  Social Connections: Not on file   Additional Social History:                         Sleep: Poor  Appetite:  Fair  Current Medications: Current Facility-Administered Medications  Medication Dose Route Frequency Provider Last Rate Last Admin   acetaminophen (TYLENOL) tablet 650 mg  650 mg Oral Q4H PRN Clapacs, John T, MD   650 mg at 04/24/21 0214   acetaminophen (TYLENOL) tablet 650 mg  650 mg Oral Q6H PRN Clapacs, John T, MD   650 mg at 04/25/21 2055   albuterol (VENTOLIN HFA) 108 (90 Base) MCG/ACT inhaler 2 puff  2 puff Inhalation QID PRN Clapacs, John T, MD   2 puff at 04/27/21 2101   alum & mag hydroxide-simeth (MAALOX/MYLANTA) 200-200-20 MG/5ML suspension 30 mL  30 mL Oral Q4H PRN Clapacs, John T, MD   30 mL at 04/26/21 1331   amLODipine (NORVASC) tablet 5 mg  5 mg Oral  Daily Clapacs, John T, MD   5 mg at 04/27/21 0902   divalproex (DEPAKOTE) DR tablet 500 mg  500 mg Oral Q12H Clapacs, John T, MD   500 mg at 04/27/21 2100   hydrochlorothiazide (HYDRODIURIL) tablet 25 mg  25 mg Oral Daily Clapacs,  Madie Reno, MD   25 mg at 04/27/21 1638   ibuprofen (ADVIL) tablet 800 mg  800 mg Oral Q8H PRN Clapacs, Madie Reno, MD   800 mg at 04/28/21 0126   levothyroxine (SYNTHROID) tablet 25 mcg  25 mcg Oral QAC breakfast Clapacs, Madie Reno, MD   25 mcg at 04/28/21 0543   magnesium hydroxide (MILK OF MAGNESIA) suspension 30 mL  30 mL Oral Daily PRN Clapacs, Madie Reno, MD       metFORMIN (GLUCOPHAGE) tablet 1,000 mg  1,000 mg Oral BID WC Clapacs, John T, MD   1,000 mg at 04/28/21 0731   methocarbamol (ROBAXIN) tablet 500 mg  500 mg Oral TID PRN Rulon Sera, MD   500 mg at 04/27/21 1408   metoprolol succinate (TOPROL-XL) 24 hr tablet 50 mg  50 mg Oral Daily Clapacs, Madie Reno, MD   50 mg at 04/27/21 0903   OLANZapine (ZYPREXA) injection 5 mg  5 mg Intramuscular BID PRN Clapacs, Madie Reno, MD   5 mg at 04/20/21 1930   pantoprazole (PROTONIX) EC tablet 40 mg  40 mg Oral Daily Clapacs, Madie Reno, MD   40 mg at 04/28/21 0731   potassium chloride (KLOR-CON) CR tablet 10 mEq  10 mEq Oral BID Clapacs, Madie Reno, MD   10 mEq at 04/27/21 2100   risperiDONE (RISPERDAL M-TABS) disintegrating tablet 2 mg  2 mg Oral BID Clapacs, Madie Reno, MD   2 mg at 04/27/21 2100    Lab Results:  Results for orders placed or performed during the hospital encounter of 04/19/21 (from the past 48 hour(s))  Glucose, capillary     Status: Abnormal   Collection Time: 04/27/21  4:35 PM  Result Value Ref Range   Glucose-Capillary 190 (H) 70 - 99 mg/dL    Comment: Glucose reference range applies only to samples taken after fasting for at least 8 hours.   *Note: Due to a large number of results and/or encounters for the requested time period, some results have not been displayed. A complete set of results can be found in Results Review.     Blood Alcohol level:  Lab Results  Component Value Date   ETH <10 04/17/2021   ETH <10 45/36/4680    Metabolic Disorder Labs: Lab Results  Component Value Date   HGBA1C 8.3 (H) 04/19/2021   MPG 192 04/19/2021   MPG 200 10/01/2020   No results found for: PROLACTIN Lab Results  Component Value Date   CHOL 166 04/19/2021   TRIG 213 (H) 04/19/2021   HDL 43 04/19/2021   CHOLHDL 3.9 04/19/2021   VLDL 43 (H) 04/19/2021   LDLCALC 80 04/19/2021   LDLCALC 180 (H) 10/01/2020    Physical Findings: AIMS:  , ,  ,  ,    CIWA:    COWS:     Musculoskeletal: Strength & Muscle Tone: within normal limits Gait & Station: normal Patient leans: N/A  Psychiatric Specialty Exam:  Presentation  General Appearance: Casual Eye Contact:Fair Speech:Pressured Speech Volume:Increased Handedness:Right  Mood and Affect  Mood:Anxious; Irritable Affect:Congruent  Thought Process  Thought Processes:Goal Directed Descriptions of Associations:Intact Orientation:Full (Time, Place and Person) Thought Content:Paranoid Ideation; Perseveration; Illogical History of Schizophrenia/Schizoaffective disorder:Yes  Duration of Psychotic Symptoms:Greater than six months  Hallucinations:Hallucinations: Auditory Ideas of Reference:Paranoia; Percusatory Suicidal Thoughts:Suicidal Thoughts: No Homicidal Thoughts:Homicidal Thoughts: No  Sensorium  Memory:Immediate Fair; Recent Fair; Remote Poor Judgment:Impaired Insight:Shallow  Executive Functions  Concentration:Fair Attention Span:Fair [place]  Psychomotor Activity  Psychomotor Activity:Psychomotor Activity: Normal  Assets  Assets:Desire for Improvement; Financial Resources/Insurance  Sleep  Sleep:Sleep: Poor Number of Hours of Sleep: 2   Physical Exam: Physical Exam ROS Blood pressure 129/80, pulse 79, temperature (!) 97.5 F (36.4 C), temperature source Oral, resp. rate 17, SpO2 99  %. There is no height or weight on file to calculate BMI.   Treatment Plan Summary: Daily contact with patient to assess and evaluate symptoms and progress in treatment and Medication management 70-year-female presenting for paranoia and acute psychosis. Continue Depakote 500 mg BID for mood stabilization, and Risperdal 2 mg BID. Today she is agreeable to [place]. Will administer loading dose Invega 234 mg IM injection today. Continue Norvasc 5 mg daily, HCTZ 25 mg daily, and toprol XL 50 mg daily for HTN. Continue synthroid 25 mg daily for hypothyrodism. Metformin 1000 mg Bid for diabetes, protonix 40 mg daily for GERD, and robaxin 500 mg TID PRN for muscle spasms. Potassium 10 meq BID for supplementation for diuretic wasting.   Salley Scarlet, MD 04/28/2021, 10:47 AM

## 2021-04-28 NOTE — Progress Notes (Signed)
Assumed care. Pt report received from [place], [place].

## 2021-04-28 NOTE — Telephone Encounter (Signed)
Pt called stating she wanted [place] to check on her Wynetta Emery and Wynetta Emery injections she is having really bad spasms.  She also stated that the nurses at the facility that she is in are giving her meds every 8 hours and it should be every 3 - 4 hours.  She stated that her sister [name] and her son have been convinced by [facility] that something is wrong with her mentally and that is not true. She stated that someone has used voodoo and witchcraft on her and using her as a pattern.  She stated that she wanted to get out of this facility today because her sisters and Kennon Rounds are the ones that are behind all of this talking to the doctors and truing them into devil worshipers.

## 2021-04-28 NOTE — Telephone Encounter (Signed)
Dr. Sharol Given is aware of the pt's hospitalization and if a consult is needed they will call and request.

## 2021-04-28 NOTE — Progress Notes (Signed)
Recreation Therapy Notes   Date: 04/28/2021  Time: 1:30 pm  Location: Craft room    Behavioral response: N/A   Intervention Topic: Decision Making   Discussion/Intervention: Patient did not attend group.   Clinical Observations/Feedback:  Patient invited to group; patient declined   Millissa Deese LRT/CTRS         Chardai Gangemi 04/28/2021 4:14 PM

## 2021-04-28 NOTE — Progress Notes (Signed)
Hospital meal provided, pt tolerated w/o complaints.  Waste discarded appropriately.  

## 2021-04-28 NOTE — Progress Notes (Signed)
Pt on phone with multiple of her children. She is becoming agitated requesting that they bring her some new clothing and money.  Pt states to family that our facility "is not feeding her enough" and that she needs money to buy food.  Staff has explained to [name] Long that at any time if she requires a snack even outside snack times to please ask.  She is provided and eats 100% of all 3 meals daily.  [name] Long continues to share her paranoid thoughts and feelings regarding her family and staff members.  She has been seen responding to internal stimuli by talking to self as well as using hand gestures while alone as if to dismiss persons.

## 2021-04-28 NOTE — Group Note (Signed)
[place] LCSW Group Therapy Note   Group Date: 04/28/2021 Start Time: 1400 End Time: 1405   Type of Therapy/Topic:  Group Therapy:  Emotion Regulation  Participation Level:  Did Not Attend     Description of Group:    The purpose of this group is to assist patients in learning to regulate negative emotions and experience positive emotions. Patients will be guided to discuss ways in which they have been vulnerable to their negative emotions. These vulnerabilities will be juxtaposed with experiences of positive emotions or situations, and patients challenged to use positive emotions to combat negative ones. Special emphasis will be placed on coping with negative emotions in conflict situations, and patients will process healthy conflict resolution skills.  Therapeutic Goals: Patient will identify two positive emotions or experiences to reflect on in order to balance out negative emotions:  Patient will label two or more emotions that they find the most difficult to experience:  Patient will be able to demonstrate positive conflict resolution skills through discussion or role plays:   Summary of Patient Progress:   X    Therapeutic Modalities:   Cognitive Behavioral Therapy Feelings Identification Dialectical Behavioral Therapy   Nakhi Choi A [name], LCSWA

## 2021-04-28 NOTE — Progress Notes (Addendum)
Pt given paliperidone (INVEGA SUSTENNA) injection 234 mg in R deltoid.  Tolerated well w/o complaints.  Pt now believes she can d/c since she has taken the injection.  Nursing staff informed [name] Long that her D/C is to be discussed with her Dr and that nursing does not determine this.  She also showed nursing staff that her ankles were slightly swollen.  Nursing advised pt to elevate feet while resting as she has been standing at the telephone for several hours today.  Pt continues to contact the orthopedic clinic in [place], she is verbalizing her paranoid thoughts/delusions to this office.  Staff will cont to monitor pt for any additional changes in behaviors and for continued safety

## 2021-04-29 ENCOUNTER — Telehealth: Payer: Self-pay

## 2021-04-29 MED ORDER — DIVALPROEX SODIUM 250 MG PO DR TAB
750.0000 mg | DELAYED_RELEASE_TABLET | Freq: Two times a day (BID) | ORAL | Status: DC
  Administered 2021-04-29 – 2021-04-30 (×2): 500 mg via ORAL
  Administered 2021-05-01 – 2021-05-06 (×11): 750 mg via ORAL
  Filled 2021-04-29: qty 6
  Filled 2021-04-29 (×7): qty 3
  Filled 2021-04-29: qty 6
  Filled 2021-04-29 (×5): qty 3

## 2021-04-29 NOTE — Progress Notes (Signed)
Recreation Therapy Notes  Date: 04/29/2021  Time: 1:30pm    Location: Day room   Behavioral response: Appropriate  Intervention Topic: Leisure   Discussion/Intervention:  Group content today was focused on leisure. The group defined what leisure is and some positive leisure activities they participate in. Individuals identified the difference between good and bad leisure. Participants expressed how they feel after participating in the leisure of their choice. The group discussed how they go about picking a leisure activity and if others are involved in their leisure activities. The patient stated how many leisure activities they have to choose from and reasons why it is important to have leisure time. Individuals participated in the intervention "Exploration of Leisure" where they had a chance to identify new leisure activities as well as benefits of leisure. Clinical Observations/Feedback: Patient came to group and was focused on what peers and staff had to say about leisure. She identified singing and music and leisure activities she enjoys. Participant also expressed she like to shop at store like [employer], [place] and [place] when she goes out.Individual was social with peers and staff while participating in the intervention. Patient responded to internal stimuli on and off during group as well as hitting at people who are not present. Participant became agitated and verbally aggressive at the end of group because she was not allowed to have the remote.    [name] Long LRT/CTRS         [name] Long 04/29/2021 2:47 PM

## 2021-04-29 NOTE — Progress Notes (Signed)
Assumed care. Pt report received from [place], [place].

## 2021-04-29 NOTE — Progress Notes (Signed)
Pt standing at nurses' station; calm, cooperative. Pt states "I'm alright." Pt denies pain and SI/HI/AVH at this time. Pt reports "I slept great" and describes appetite as "alright". Pt denies anxiety and depression at this time. Pt states "I felt annoyed because the girl that works here said she doesn't do taxes and I know she did mine." No acute distress noted.

## 2021-04-29 NOTE — Telephone Encounter (Signed)
A Nurse from [facility] called regarding patient stating that she is being held hostage.  And that she needs call back due to an emergency about her legs  CB # [phone]

## 2021-04-29 NOTE — Group Note (Signed)
[facility] LCSW Group Therapy Note   Group Date: 04/29/2021 Start Time: 1400 End Time: 1430   Type of Therapy/Topic:  Group Therapy:  Balance in Life  Participation Level:  Active   Description of Group:    This group will address the concept of balance and how it feels and looks when one is unbalanced. Patients will be encouraged to process areas in their lives that are out of balance, and identify reasons for remaining unbalanced. Facilitators will guide patients utilizing problem- solving interventions to address and correct the stressor making their life unbalanced. Understanding and applying boundaries will be explored and addressed for obtaining  and maintaining a balanced life. Patients will be encouraged to explore ways to assertively make their unbalanced needs known to significant others in their lives, using other group members and facilitator for support and feedback.  Therapeutic Goals: Patient will identify two or more emotions or situations they have that consume much of in their lives. Patient will identify signs/triggers that life has become out of balance:  Patient will identify two ways to set boundaries in order to achieve balance in their lives:  Patient will demonstrate ability to communicate their needs through discussion and/or role plays  Summary of Patient Progress:  Patient was present for the entirety of the group session. Patient participated in the topic of discussion and added nuance to topic of conversation. Patient exhibited a pleasant demeanor and had enjoyed the painting activity. CSW asked participants questions while they did the activity. She did appear to be responding to stimuli throughout the session. She stated that listening to music helped her stay calm when she feels really badly.    Therapeutic Modalities:   Cognitive Behavioral Therapy Solution-Focused Therapy Assertiveness Training   [place] A [name], LCSWA

## 2021-04-29 NOTE — Progress Notes (Signed)
[facility] MD Progress Note  04/29/2021 5:31 PM [name] Long  MRN:  [id number] Subjective: Follow-up patient with schizoaffective disorder.  Patient seen and chart reviewed.  Patient continues to be disorganized in her thinking.  Paranoid.  Hostile at times but not physically aggressive.  Threatened to sue me several times today because I told her I did not think she was ready to be discharged.  Most of the time however she is interacting with the rest of the staff and her peers without any difficulty.  Physically seems stable.  Takes care of her ADLs without any difficulty.  Tolerated [place]ve[place] shot. Principal Problem: Schizoaffective disorder ([place]) Diagnosis: Principal Problem:   Schizoaffective disorder ([place]) Active Problems:   Hypothyroidism   Type 2 diabetes mellitus ([place])   HYPERCHOLESTEROLEMIA   HYPERTENSION, BENIGN SYSTEMIC   Hypokalemia   Atrial fibrillation (HCC)  Total Time spent with patient: 30 minutes  Past Psychiatric History: History of schizoaffective disorder with recurrent manic symptoms  Past Medical History:  Past Medical History:  Diagnosis Date   Abscessed tooth 02/09/2020   Acute on chronic congestive heart failure ([place])    Adenomatous colon polyp    Arthritis    "real bad; all over" (07/12/2017)   Asthma    Atrial fibrillation ([place])    BIPOLAR DISORDER 08/31/2006   Qualifier: Diagnosis of  By: Dorathy Daft MD, Marjory Lies     CHRONIC KIDNEY DISEASE STAGE II (MILD) 09/14/2009   Annotation: eGFR 55 Qualifier: Diagnosis of  By: Jess Barters MD, Erik     Chronic lower back pain    Congestive heart failure (CHF) (HCC)    COPD (chronic obstructive pulmonary disease) ([place]) 09/23/2010   Diagnosed at [facility] in 2008 (Dr. Annamaria Boots)    DM (diabetes mellitus) type II controlled with renal manifestation ([place]) 08/31/2006   Qualifier: Diagnosis of  By: Dorathy Daft MD, Marjory Lies     Dyspnea    "all my life; since 6th grade" (07/12/2017)   Fatty liver    HYPERCHOLESTEROLEMIA 08/31/2006   Intolerance  to Lipitor OK on Crestor but medicaid no longer covering     HYPERTENSION, BENIGN SYSTEMIC 08/31/2006   Qualifier: Diagnosis of  By: Dorathy Daft MD, Aaron     Hypokalemia    HYPOTHYROIDISM, UNSPECIFIED 08/31/2006   Qualifier: Diagnosis of  By: Dorathy Daft MD, Marjory Lies     Leg swelling 03/14/2018   Pneumonia    "3 times" (07/12/2017)   Pulmonary nodule    Renal cyst    Schizophrenia ([place])    Scoliosis    Stomach problems    Thyroid disorder     Past Surgical History:  Procedure Laterality Date   CESAREAN SECTION     FOOT FRACTURE SURGERY Right    "steel plate in it"   FOOT SURGERY     " born w/dislocated foot"   FRACTURE SURGERY     KNEE ARTHROSCOPY Right    TOE SURGERY Bilateral    "both pinky toes"   TONSILLECTOMY AND ADENOIDECTOMY     Family History:  Family History  Problem Relation Age of Onset   Breast cancer Sister    Heart disease Mother 2   Rectal cancer Mother    Diabetes Father 60   Hypertension Father    Heart disease Brother    Asthma Daughter    Asthma Son    Breast cancer Sister    Asthma Daughter    Colon cancer Maternal Grandmother    Stomach cancer Neg Hx    Esophageal cancer Neg Hx  Pancreatic cancer Neg Hx    Family Psychiatric  History: See previous Social History:  Social History   Substance and Sexual Activity  Alcohol Use Not Currently   Comment: 07/12/2017 "stopped 2 yr ago; did have a drink over the holidays recently"     Social History   Substance and Sexual Activity  Drug Use No    Social History   Socioeconomic History   Marital status: Divorced    Spouse name: Not on file   Number of children: 4   Years of education: 12   Highest education level: High school graduate  Occupational History   Not on file  Tobacco Use   Smoking status: Former    Packs/day: 0.25    Years: 45.00    Pack years: 11.25    Types: Cigarettes   Smokeless tobacco: Never   Tobacco comments:    patient states she smokes 1 cigarette per month  Vaping  Use   Vaping Use: Never used  Substance and Sexual Activity   Alcohol use: Not Currently    Comment: 07/12/2017 "stopped 2 yr ago; did have a drink over the holidays recently"   Drug use: No   Sexual activity: Not Currently  Other Topics Concern   Not on file  Social History Narrative   Patient lives alone in [place].    Patient does not drive, she uses insurance transportation or her children.   Patient enjoys twirling her paton, listening to music, dancing, and spending time with family.    Patient enjoys celebrating all holidays and birthdays of loved ones.   Social Determinants of Health   Financial Resource Strain: Not on file  Food Insecurity: Not on file  Transportation Needs: Not on file  Physical Activity: Not on file  Stress: Not on file  Social Connections: Not on file   Additional Social History:                         Sleep: Fair  Appetite:  Fair  Current Medications: Current Facility-Administered Medications  Medication Dose Route Frequency Provider Last Rate Last Admin   acetaminophen (TYLENOL) tablet 650 mg  650 mg Oral Q4H PRN Abi Shoults T, MD   650 mg at 04/24/21 0214   acetaminophen (TYLENOL) tablet 650 mg  650 mg Oral Q6H PRN Deiontae Rabel T, MD   650 mg at 04/25/21 2055   albuterol (VENTOLIN HFA) 108 (90 Base) MCG/ACT inhaler 2 puff  2 puff Inhalation QID PRN Angellynn Kimberlin T, MD   2 puff at 04/27/21 2101   alum & mag hydroxide-simeth (MAALOX/MYLANTA) 200-200-20 MG/5ML suspension 30 mL  30 mL Oral Q4H PRN Lachanda Buczek T, MD   30 mL at 04/28/21 1942   amLODipine (NORVASC) tablet 5 mg  5 mg Oral Daily Zyliah Schier T, MD   5 mg at 04/29/21 0809   divalproex (DEPAKOTE) DR tablet 750 mg  750 mg Oral Q12H Cintia Gleed T, MD       hydrochlorothiazide (HYDRODIURIL) tablet 25 mg  25 mg Oral Daily Eual Lindstrom T, MD   25 mg at 04/29/21 4132   ibuprofen (ADVIL) tablet 800 mg  800 mg Oral Q8H PRN Courtney Bellizzi T, MD   800 mg at 04/29/21 4401    levothyroxine (SYNTHROID) tablet 25 mcg  25 mcg Oral QAC breakfast Hollin Crewe T, MD   25 mcg at 04/29/21 0601   magnesium hydroxide (MILK OF MAGNESIA) suspension 30 mL  30  mL Oral Daily PRN [place] Name, Madie Reno, MD       metFORMIN (GLUCOPHAGE) tablet 1,000 mg  1,000 mg Oral BID WC Kaylina Cahue T, MD   1,000 mg at 04/29/21 1617   methocarbamol (ROBAXIN) tablet 500 mg  500 mg Oral TID PRN Rulon Sera, MD   500 mg at 04/27/21 1408   metoprolol succinate (TOPROL-XL) 24 hr tablet 50 mg  50 mg Oral Daily Natalie Mceuen T, MD   50 mg at 04/29/21 0808   OLANZapine (ZYPREXA) injection 5 mg  5 mg Intramuscular BID PRN Haidyn Kilburg T, MD   5 mg at 04/20/21 1930   pantoprazole (PROTONIX) EC tablet 40 mg  40 mg Oral Daily Michalle Rademaker T, MD   40 mg at 04/29/21 4174   potassium chloride (KLOR-CON) CR tablet 10 mEq  10 mEq Oral BID Taylr Meuth T, MD   10 mEq at 04/29/21 0809   risperiDONE (RISPERDAL M-TABS) disintegrating tablet 2 mg  2 mg Oral BID Keontay Vora, Madie Reno, MD   2 mg at 04/28/21 1100    Lab Results: No results found. However, due to the size of the patient record, not all encounters were searched. Please check Results Review for a complete set of results.  Blood Alcohol level:  Lab Results  Component Value Date   ETH <10 04/17/2021   ETH <10 02/14/4817    Metabolic Disorder Labs: Lab Results  Component Value Date   HGBA1C 8.3 (H) 04/19/2021   MPG 192 04/19/2021   MPG 200 10/01/2020   No results found for: PROLACTIN Lab Results  Component Value Date   CHOL 166 04/19/2021   TRIG 213 (H) 04/19/2021   HDL 43 04/19/2021   CHOLHDL 3.9 04/19/2021   VLDL 43 (H) 04/19/2021   LDLCALC 80 04/19/2021   LDLCALC 180 (H) 10/01/2020    Physical Findings: AIMS:  , ,  ,  ,    CIWA:    COWS:     Musculoskeletal: Strength & Muscle Tone: within normal limits Gait & Station: normal Patient leans: N/A  Psychiatric Specialty Exam:  Presentation  General Appearance: Casual  Eye  Contact:Fair  Speech:Pressured  Speech Volume:Increased  Handedness:Right   Mood and Affect  Mood:Anxious; Irritable  Affect:Congruent   Thought Process  Thought Processes:Goal Directed  Descriptions of Associations:Intact  Orientation:Full (Time, Place and Person)  Thought Content:Paranoid Ideation; Perseveration; Illogical  History of Schizophrenia/Schizoaffective disorder:Yes  Duration of Psychotic Symptoms:Greater than six months  Hallucinations:Hallucinations: Auditory  Ideas of Reference:Paranoia; Percusatory  Suicidal Thoughts:Suicidal Thoughts: No  Homicidal Thoughts:Homicidal Thoughts: No   Sensorium  Memory:Immediate Fair; Recent Fair; Remote Poor  Judgment:Impaired  Insight:Shallow   Executive Functions  Concentration:Fair  Attention Span:Fair  [place]   Psychomotor Activity  Psychomotor Activity:Psychomotor Activity: Normal   Assets  Assets:Desire for Improvement; Financial Resources/Insurance   Sleep  Sleep:Sleep: Poor Number of Hours of Sleep: 2    Physical Exam: Physical Exam Vitals and nursing note reviewed.  Constitutional:      Appearance: Normal appearance.  HENT:     Head: Normocephalic and atraumatic.     Mouth/Throat:     Pharynx: Oropharynx is clear.  Eyes:     Pupils: Pupils are equal, round, and reactive to light.  Cardiovascular:     Rate and Rhythm: Normal rate and regular rhythm.  Pulmonary:     Effort: Pulmonary effort is normal.     Breath sounds: Normal breath sounds.  Abdominal:  General: Abdomen is flat.     Palpations: Abdomen is soft.  Musculoskeletal:        General: Normal range of motion.  Skin:    General: Skin is warm and dry.  Neurological:     General: No focal deficit present.     Mental Status: She is alert. Mental status is at baseline.  Psychiatric:        Attention and Perception: Attention normal.        Mood and Affect: Mood  normal. Affect is labile.        Speech: Speech is tangential.        Behavior: Behavior is agitated.        Thought Content: Thought content normal.        Cognition and Memory: Memory is impaired.        Judgment: Judgment is impulsive.   Review of Systems  Constitutional: Negative.   HENT: Negative.    Eyes: Negative.   Respiratory: Negative.    Cardiovascular: Negative.   Gastrointestinal: Negative.   Musculoskeletal: Negative.   Skin: Negative.   Neurological: Negative.   Psychiatric/Behavioral:  Negative for depression, hallucinations, substance abuse and suicidal ideas.   Blood pressure 128/70, pulse 75, temperature 98.7 F (37.1 C), temperature source Oral, resp. rate 16, SpO2 97 %. There is no height or weight on file to calculate BMI.   Treatment Plan Summary: Medication management and Plan increase Depakote dose to 750 mg twice a day.  Patient is still on oral Risperdal but is refusing it but at least she has the [place] shot.  Next injection will be another week.  Hopefully she will gradually continue to improve between now and then.  No other change to treatment plan.  Alethia Berthold, MD 04/29/2021, 5:31 PM

## 2021-04-29 NOTE — Progress Notes (Signed)
Pt heard "fussing" while in room alone. When asked who she was talking with; pt stated "I'm talking to the Naplate, baby." Pt refused risperidone 2 mg and refused to take depakote 750 mg. Pt stated that she will only take 2/3 depakote 250 mg tabs because "Every time the doctor goes up on my medicine, I have to stay here longer." Pt educated on the importance of taking medications as prescribed; however, she continued to express desire not to take risperidone because she has taken a "shot" and does not need it and not to take more than 2 depakote tablets so that she will not have to stay here any longer than she has to.

## 2021-04-29 NOTE — Telephone Encounter (Signed)
I called and sw nurse advised that Dr. Sharol Given has been made aware of her messages and if there is an orthopedic issue he is happy to help. Nurse states that she will reiterate to the pt when its is necessary to call and they will clal if the pt has any otho needs.

## 2021-04-29 NOTE — Progress Notes (Addendum)
Patient affect is pleasant, calm and cooperative. Denies SI / HI or AVH, but patient is clearly responding to internal stimuli with gesturing and "batting away those things" with her hands. Speech content is appropriate when initially engaging but then begins rambling about unrelated and delusional topics such as people impersonating her and the "reason I'm stuck here" as well as paranoid statements of how her family is "out to get me" Observed pt rambling in dayroom including, "Leave me alone. I told you to get out." Pt frequently on phone making calls.  Reports sleeping good with no request for medication, appetite good and energy level normal.  Reports lower back pain 7/10. Ibuprofen given as ordered.    Patient compliant with all scheduled meds except Resperidone stating, "I took the shot so I don't have to take that."  MD made aware.     Reviewed POC with patient. Questions answered and understanding verbalized. Ongoing Q15 minute safety check rounds per unit protocol.

## 2021-04-30 MED ORDER — FUROSEMIDE 20 MG PO TABS
20.0000 mg | ORAL_TABLET | Freq: Once | ORAL | Status: AC
  Administered 2021-04-30: 20 mg via ORAL
  Filled 2021-04-30: qty 1

## 2021-04-30 MED ORDER — ASPIRIN EC 81 MG PO TBEC
81.0000 mg | DELAYED_RELEASE_TABLET | Freq: Every day | ORAL | Status: DC
  Administered 2021-05-01 – 2021-05-06 (×6): 81 mg via ORAL
  Filled 2021-04-30 (×6): qty 1

## 2021-04-30 NOTE — Progress Notes (Addendum)
Recreation Therapy Notes  Date: 04/30/2021  Time: 1:15 pm    Location: Courtyard    Behavioral response: Appropriate  Intervention Topic: Social Skills      Discussion/Intervention:  Group content on today was focused on Education officer, community. The group defined social skills and identified ways they use social skills. Patients expressed what obstacles they face when trying to be social. Participants described the importance of social skills. The group listed ways to improve social skills and reasons to improve social skills. Individuals had an opportunity to learn new and improve social skills as well as identify their weaknesses. Clinical Observations/Feedback: Patient came to group and was focused on what peers and staff had to say about social skills. Participant stated she is normally social with family. She expressed that she likes to talk about music and sing. Patient shared a memory about her siblings and her going downtown [place] to buy records. Individual was social with peers and staff while participating in the intervention.  Zettie Gootee LRT/CTRS         Marylee Belzer 04/30/2021 2:57 PM

## 2021-04-30 NOTE — Group Note (Signed)
[place] LCSW Group Therapy Note   Group Date: 04/30/2021 Start Time: 1500 End Time: 1530  Type of Therapy and Topic:  Group Therapy:  Feelings around Relapse and Recovery  Participation Level:  Minimal    Description of Group:    Patients in this group will discuss emotions they experience before and after a relapse. They will process how experiencing these feelings, or avoidance of experiencing them, relates to having a relapse. Facilitator will guide patients to explore emotions they have related to recovery. Patients will be encouraged to process which emotions are more powerful. They will be guided to discuss the emotional reaction significant others in their lives may have to patients' relapse or recovery. Patients will be assisted in exploring ways to respond to the emotions of others without this contributing to a relapse.  Therapeutic Goals: Patient will identify two or more emotions that lead to relapse for them:  Patient will identify two emotions that result when they relapse:  Patient will identify two emotions related to recovery:  Patient will demonstrate ability to communicate their needs through discussion and/or role plays.   Summary of Patient Progress:  Patient was present during the group session. Patient did discuss her children and how she is proud that they are very independent and  "have their own lives." She was somewhat irritable since the television was not on during session. Patient continues to exhibit signs of responding to internal stimuli.    Therapeutic Modalities:   Cognitive Behavioral Therapy Solution-Focused Therapy Assertiveness Training Relapse Prevention Therapy   Josefita Weissmann A [name], LCSWA
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
## 2021-04-30 NOTE — Progress Notes (Signed)
[facility] MD Progress Note  04/30/2021 5:24 PM [name] Long  MRN:  [id number] Subjective: Patient seen and chart reviewed.  Patient continues to be delusional and paranoid but actually was a little bit less irritable with me today.  Still threatening to sue me and showing no insight but for the most part getting along okay on the ward.  Has been refusing to take the full dose of Depakote.  She does now have the first Invega injection at least.  No new complaints Principal Problem: Schizoaffective disorder ([place]) Diagnosis: Principal Problem:   Schizoaffective disorder ([place]) Active Problems:   Hypothyroidism   Type 2 diabetes mellitus ([place])   HYPERCHOLESTEROLEMIA   HYPERTENSION, BENIGN SYSTEMIC   Hypokalemia   Atrial fibrillation ([place])  Total Time spent with patient: 30 minutes  Past Psychiatric History: Past history of schizoaffective disorder multiple prior hospitalizations  Past Medical History:  Past Medical History:  Diagnosis Date   Abscessed tooth 02/09/2020   Acute on chronic congestive heart failure ([place])    Adenomatous colon polyp    Arthritis    "real bad; all over" (07/12/2017)   Asthma    Atrial fibrillation ([place])    BIPOLAR DISORDER 08/31/2006   Qualifier: Diagnosis of  By: Dorathy Daft MD, Marjory Lies     CHRONIC KIDNEY DISEASE STAGE II (MILD) 09/14/2009   Annotation: eGFR 82 Qualifier: Diagnosis of  By: Jess Barters MD, Erik     Chronic lower back pain    Congestive heart failure (CHF) (HCC)    COPD (chronic obstructive pulmonary disease) ([place]) 09/23/2010   Diagnosed at [facility] in 2008 (Dr. Annamaria Boots)    DM (diabetes mellitus) type II controlled with renal manifestation ([employer]) 08/31/2006   Qualifier: Diagnosis of  By: Dorathy Daft MD, Marjory Lies     Dyspnea    "all my life; since 6th grade" (07/12/2017)   Fatty liver    HYPERCHOLESTEROLEMIA 08/31/2006   Intolerance to Lipitor OK on Crestor but medicaid no longer covering     HYPERTENSION, BENIGN SYSTEMIC 08/31/2006   Qualifier: Diagnosis of   By: Dorathy Daft MD, Aaron     Hypokalemia    HYPOTHYROIDISM, UNSPECIFIED 08/31/2006   Qualifier: Diagnosis of  By: Dorathy Daft MD, Marjory Lies     Leg swelling 03/14/2018   Pneumonia    "3 times" (07/12/2017)   Pulmonary nodule    Renal cyst    Schizophrenia ([place])    Scoliosis    Stomach problems    Thyroid disorder     Past Surgical History:  Procedure Laterality Date   CESAREAN SECTION     FOOT FRACTURE SURGERY Right    "steel plate in it"   FOOT SURGERY     " born w/dislocated foot"   FRACTURE SURGERY     KNEE ARTHROSCOPY Right    TOE SURGERY Bilateral    "both pinky toes"   TONSILLECTOMY AND ADENOIDECTOMY     Family History:  Family History  Problem Relation Age of Onset   Breast cancer Sister    Heart disease Mother 48   Rectal cancer Mother    Diabetes Father 60   Hypertension Father    Heart disease Brother    Asthma Daughter    Asthma Son    Breast cancer Sister    Asthma Daughter    Colon cancer Maternal Grandmother    Stomach cancer Neg Hx    Esophageal cancer Neg Hx    Pancreatic cancer Neg Hx    Family Psychiatric  History: See previous Social History:  Social History   Substance and Sexual Activity  Alcohol Use Not Currently   Comment: 07/12/2017 "stopped 2 yr ago; did have a drink over the holidays recently"     Social History   Substance and Sexual Activity  Drug Use No    Social History   Socioeconomic History   Marital status: Divorced    Spouse name: Not on file   Number of children: 4   Years of education: 12   Highest education level: High school graduate  Occupational History   Not on file  Tobacco Use   Smoking status: Former    Packs/day: 0.25    Years: 45.00    Pack years: 11.25    Types: Cigarettes   Smokeless tobacco: Never   Tobacco comments:    patient states she smokes 1 cigarette per month  Vaping Use   Vaping Use: Never used  Substance and Sexual Activity   Alcohol use: Not Currently    Comment: 07/12/2017 "stopped 2 yr  ago; did have a drink over the holidays recently"   Drug use: No   Sexual activity: Not Currently  Other Topics Concern   Not on file  Social History Narrative   Patient lives alone in [place].    Patient does not drive, she uses insurance transportation or her children.   Patient enjoys twirling her paton, listening to music, dancing, and spending time with family.    Patient enjoys celebrating all holidays and birthdays of loved ones.   Social Determinants of Health   Financial Resource Strain: Not on file  Food Insecurity: Not on file  Transportation Needs: Not on file  Physical Activity: Not on file  Stress: Not on file  Social Connections: Not on file   Additional Social History:                         Sleep: Fair  Appetite:  Fair  Current Medications: Current Facility-Administered Medications  Medication Dose Route Frequency Provider Last Rate Last Admin   acetaminophen (TYLENOL) tablet 650 mg  650 mg Oral Q4H PRN Kryssa Risenhoover T, MD   650 mg at 04/24/21 0214   acetaminophen (TYLENOL) tablet 650 mg  650 mg Oral Q6H PRN Jessicalynn Deshong T, MD   650 mg at 04/30/21 0046   albuterol (VENTOLIN HFA) 108 (90 Base) MCG/ACT inhaler 2 puff  2 puff Inhalation QID PRN Chijioke Lasser T, MD   2 puff at 04/27/21 2101   alum & mag hydroxide-simeth (MAALOX/MYLANTA) 200-200-20 MG/5ML suspension 30 mL  30 mL Oral Q4H PRN Jerremy Maione T, MD   30 mL at 04/28/21 1942   amLODipine (NORVASC) tablet 5 mg  5 mg Oral Daily Lama Narayanan T, MD   5 mg at 04/30/21 0918   divalproex (DEPAKOTE) DR tablet 750 mg  750 mg Oral Q12H Sharelle Burditt T, MD   500 mg at 04/30/21 4827   hydrochlorothiazide (HYDRODIURIL) tablet 25 mg  25 mg Oral Daily Amire Leazer T, MD   25 mg at 04/30/21 0786   ibuprofen (ADVIL) tablet 800 mg  800 mg Oral Q8H PRN Anabelle Bungert T, MD   800 mg at 04/30/21 0143   levothyroxine (SYNTHROID) tablet 25 mcg  25 mcg Oral QAC breakfast Denym Rahimi, Madie Reno, MD   25 mcg at 04/30/21  0750   magnesium hydroxide (MILK OF MAGNESIA) suspension 30 mL  30 mL Oral Daily PRN Gaila Engebretsen, Madie Reno, MD  metFORMIN (GLUCOPHAGE) tablet 1,000 mg  1,000 mg Oral BID WC Jenkins Risdon T, MD   1,000 mg at 04/30/21 1704   methocarbamol (ROBAXIN) tablet 500 mg  500 mg Oral TID PRN Rulon Sera, MD   500 mg at 04/30/21 0046   metoprolol succinate (TOPROL-XL) 24 hr tablet 50 mg  50 mg Oral Daily Eliaz Fout T, MD   50 mg at 04/30/21 0916   OLANZapine (ZYPREXA) injection 5 mg  5 mg Intramuscular BID PRN Clayton Jarmon T, MD   5 mg at 04/20/21 1930   pantoprazole (PROTONIX) EC tablet 40 mg  40 mg Oral Daily Pearley Baranek T, MD   40 mg at 04/30/21 0751   potassium chloride (KLOR-CON) CR tablet 10 mEq  10 mEq Oral BID Norval Slaven T, MD   10 mEq at 04/30/21 0916   risperiDONE (RISPERDAL M-TABS) disintegrating tablet 2 mg  2 mg Oral BID Brenten Janney, Madie Reno, MD   2 mg at 04/28/21 1100    Lab Results: No results found. However, due to the size of the patient record, not all encounters were searched. Please check Results Review for a complete set of results.  Blood Alcohol level:  Lab Results  Component Value Date   ETH <10 04/17/2021   ETH <10 42/59/5638    Metabolic Disorder Labs: Lab Results  Component Value Date   HGBA1C 8.3 (H) 04/19/2021   MPG 192 04/19/2021   MPG 200 10/01/2020   No results found for: PROLACTIN Lab Results  Component Value Date   CHOL 166 04/19/2021   TRIG 213 (H) 04/19/2021   HDL 43 04/19/2021   CHOLHDL 3.9 04/19/2021   VLDL 43 (H) 04/19/2021   LDLCALC 80 04/19/2021   LDLCALC 180 (H) 10/01/2020    Physical Findings: AIMS:  , ,  ,  ,    CIWA:    COWS:     Musculoskeletal: Strength & Muscle Tone: within normal limits Gait & Station: normal Patient leans: N/A  Psychiatric Specialty Exam:  Presentation  General Appearance: Casual  Eye Contact:Fair  Speech:Pressured  Speech Volume:Increased  Handedness:Right   Mood and Affect  Mood:Anxious;  Irritable  Affect:Congruent   Thought Process  Thought Processes:Goal Directed  Descriptions of Associations:Intact  Orientation:Full (Time, Place and Person)  Thought Content:Paranoid Ideation; Perseveration; Illogical  History of Schizophrenia/Schizoaffective disorder:Yes  Duration of Psychotic Symptoms:Greater than six months  Hallucinations:No data recorded Ideas of Reference:Paranoia; Percusatory  Suicidal Thoughts:No data recorded Homicidal Thoughts:No data recorded  Sensorium  Memory:Immediate Fair; Recent Fair; Remote Poor  Judgment:Impaired  Insight:Shallow   Executive Functions  Concentration:Fair  Attention Span:Fair  [place]   Psychomotor Activity  Psychomotor Activity:No data recorded  Assets  Assets:Desire for Improvement; Financial Resources/Insurance   Sleep  Sleep:No data recorded   Physical Exam: Physical Exam Vitals and nursing note reviewed.  Constitutional:      Appearance: Normal appearance.  HENT:     Head: Normocephalic and atraumatic.     Mouth/Throat:     Pharynx: Oropharynx is clear.  Eyes:     Pupils: Pupils are equal, round, and reactive to light.  Cardiovascular:     Rate and Rhythm: Normal rate and regular rhythm.  Pulmonary:     Effort: Pulmonary effort is normal.     Breath sounds: Normal breath sounds.  Abdominal:     General: Abdomen is flat.     Palpations: Abdomen is soft.  Musculoskeletal:  General: Normal range of motion.  Skin:    General: Skin is warm and dry.  Neurological:     General: No focal deficit present.     Mental Status: She is alert. Mental status is at baseline.  Psychiatric:        Attention and Perception: Attention normal.        Mood and Affect: Mood normal.        Speech: Speech is tangential.        Behavior: Behavior is cooperative.        Thought Content: Thought content is paranoid and delusional.        Cognition and  Memory: Cognition is impaired.   Review of Systems  Constitutional: Negative.   HENT: Negative.    Eyes: Negative.   Respiratory: Negative.    Cardiovascular: Negative.   Gastrointestinal: Negative.   Musculoskeletal: Negative.   Skin: Negative.   Neurological: Negative.   Psychiatric/Behavioral: Negative.    Blood pressure 116/72, pulse 85, temperature 98.1 F (36.7 C), temperature source Axillary, resp. rate 20, SpO2 100 %. There is no height or weight on file to calculate BMI.   Treatment Plan Summary: Plan tried to reason with the patient today to encourage her to take all of her medicine.  Patient of course still is argumentative and has poor insight.  I am hoping that she is starting to show some improvement however.  Some of her phone calls to other people seem less irritated as well.  She is having more swelling in her feet and may need a little bit of Lasix and to just make sure her potassium is staying stable.  Alethia Berthold, MD 04/30/2021, 5:24 PM

## 2021-04-30 NOTE — Progress Notes (Signed)
Patient in dayroom most of early shift, interacting with peers and watching television. Patient had loud episode responding. Looked at chair next to her seat and yelled "who the hell you talking too?' Patient continued to talk briefly to chair. Patient pleasant thus far this shift.  Encouragement and support provided. Safety checks maintained. Medications given as prescribed. Pt receptive and remains safe on unit with q15 checks.

## 2021-04-30 NOTE — Plan of Care (Signed)
  Problem: Safety: Goal: Ability to remain free from injury will improve Outcome: Progressing   

## 2021-05-01 DIAGNOSIS — F259 Schizoaffective disorder, unspecified: Principal | ICD-10-CM

## 2021-05-01 LAB — BASIC METABOLIC PANEL
Anion gap: 8 (ref 5–15)
BUN: 21 mg/dL (ref 8–23)
CO2: 29 mmol/L (ref 22–32)
Calcium: 8.9 mg/dL (ref 8.9–10.3)
Chloride: 93 mmol/L — ABNORMAL LOW (ref 98–111)
Creatinine, Ser: 1.23 mg/dL — ABNORMAL HIGH (ref 0.44–1.00)
GFR, Estimated: 48 mL/min — ABNORMAL LOW (ref >60)
Glucose, Bld: 114 mg/dL — ABNORMAL HIGH (ref 70–99)
Potassium: 4.2 mmol/L (ref 3.5–5.1)
Sodium: 130 mmol/L — ABNORMAL LOW (ref 135–145)

## 2021-05-01 NOTE — Group Note (Signed)
LCSW Group Therapy Note  Group Date: 05/01/2021 Start Time: 3500 End Time: 1610   Type of Therapy and Topic:  Group Therapy - Healthy vs Unhealthy Coping Skills  Participation Level:  Active   Description of Group The focus of this group was to determine what unhealthy coping techniques typically are used by group members and what healthy coping techniques would be helpful in coping with various problems. Patients were guided in becoming aware of the differences between healthy and unhealthy coping techniques. Patients were asked to identify 2-3 healthy coping skills they would like to learn to use more effectively.  Therapeutic Goals Patients learned that coping is what human beings do all day long to deal with various situations in their lives Patients defined and discussed healthy vs unhealthy coping techniques Patients identified their preferred coping techniques and identified whether these were healthy or unhealthy Patients determined 2-3 healthy coping skills they would like to become more familiar with and use more often. Patients provided support and ideas to each other   Summary of Patient Progress: Patient was present for the entirety of the group session. Patient presented as cooperative and participated in group discussion, but often strayed from the topic and was at times difficult to redirect. Patient shared that she talks to herself which makes her family think that she is "crazy". Patient expressed how she does not agree with her diagnosis of Schizophrenia. Patient expressed paranoia towards her family members, sharing that her family members are "trying to keep her down" and were "worshiping the devil'. Patient shared that she recently began attending church again. Patient additionally shared that meeting with the Chaplain while hospitalized has been helpful for her.    Therapeutic Modalities Cognitive Behavioral Therapy Motivational Interviewing  Berniece Salines,  Latanya Presser 05/01/2021  5:27 PM
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
## 2021-05-01 NOTE — Progress Notes (Signed)
[facility] MD Progress Note  05/01/2021 2:57 PM [name] Long  MRN:  [id number] Subjective and interval hx:  Im ok" Chart reviwed and pt seen.   70 year old woman transferred from [place].  Brought to the hospital with paranoia and reports by her family that she had been threatening to family members had been off her medicine and had not been taking care of herself.  Patient has poor insight.   she is disorganized and rambling.  Frequent paranoid comments about her family.  When left by herself in a room she was talking loudly to herself and appeared to be responding to internal stimuli and talking to hallucinations. Nursing reports - pt refused hs depakote and suspicious, delusional. Patient in dayroom most of early shift, interacting with peers and watching television. Patient had loud episode responding. Looked at chair next to her seat and yelled "who the hell you talking too?' Patient continued to talk briefly to chair. Patient pleasant thus far this shift.  Encouragement and support provided. Safety checks maintained. Medications given as prescribed. Pt receptive and remains safe on unit with q15 checks.  Patient continues to be delusional and paranoid , guarded with me.   Has been refusing to take the full dose of Depakote.  She does now have the first Invega injection at least.  No new complaints. Denies SI/HI.  Principal Problem: Schizoaffective disorder ([place]) Diagnosis: Principal Problem:   Schizoaffective disorder ([place]) Active Problems:   Hypothyroidism   Type 2 diabetes mellitus ([place])   HYPERCHOLESTEROLEMIA   HYPERTENSION, BENIGN SYSTEMIC   Hypokalemia   Atrial fibrillation ([place])  Total Time spent with patient: 30 minutes  Past Psychiatric History: Past history of schizoaffective disorder multiple prior hospitalizations  Past Medical History:  Past Medical History:  Diagnosis Date   Abscessed tooth 02/09/2020   Acute on chronic congestive heart failure ([place])    Adenomatous  colon polyp    Arthritis    "real bad; all over" (07/12/2017)   Asthma    Atrial fibrillation ([place])    BIPOLAR DISORDER 08/31/2006   Qualifier: Diagnosis of  By: Dorathy Daft MD, Marjory Lies     CHRONIC KIDNEY DISEASE STAGE II (MILD) 09/14/2009   Annotation: eGFR 31 Qualifier: Diagnosis of  By: Jess Barters MD, Erik     Chronic lower back pain    Congestive heart failure (CHF) ([place])    COPD (chronic obstructive pulmonary disease) ([place]) 09/23/2010   Diagnosed at [facility] in 2008 (Dr. Annamaria Boots)    DM (diabetes mellitus) type II controlled with renal manifestation ([place]) 08/31/2006   Qualifier: Diagnosis of  By: Dorathy Daft MD, Marjory Lies     Dyspnea    "all my life; since 6th grade" (07/12/2017)   Fatty liver    HYPERCHOLESTEROLEMIA 08/31/2006   Intolerance to Lipitor OK on Crestor but medicaid no longer covering     HYPERTENSION, BENIGN SYSTEMIC 08/31/2006   Qualifier: Diagnosis of  By: Dorathy Daft MD, Aaron     Hypokalemia    HYPOTHYROIDISM, UNSPECIFIED 08/31/2006   Qualifier: Diagnosis of  By: Dorathy Daft MD, Marjory Lies     Leg swelling 03/14/2018   Pneumonia    "3 times" (07/12/2017)   Pulmonary nodule    Renal cyst    Schizophrenia ([place])    Scoliosis    Stomach problems    Thyroid disorder     Past Surgical History:  Procedure Laterality Date   CESAREAN SECTION     FOOT FRACTURE SURGERY Right    "steel plate in it"   FOOT SURGERY     "  born w/dislocated foot"   FRACTURE SURGERY     KNEE ARTHROSCOPY Right    TOE SURGERY Bilateral    "both pinky toes"   TONSILLECTOMY AND ADENOIDECTOMY     Family History:  Family History  Problem Relation Age of Onset   Breast cancer Sister    Heart disease Mother 6   Rectal cancer Mother    Diabetes Father 39   Hypertension Father    Heart disease Brother    Asthma Daughter    Asthma Son    Breast cancer Sister    Asthma Daughter    Colon cancer Maternal Grandmother    Stomach cancer Neg Hx    Esophageal cancer Neg Hx    Pancreatic cancer Neg Hx    Family  Psychiatric  History: See previous Social History:  Social History   Substance and Sexual Activity  Alcohol Use Not Currently   Comment: 07/12/2017 "stopped 2 yr ago; did have a drink over the holidays recently"     Social History   Substance and Sexual Activity  Drug Use No    Social History   Socioeconomic History   Marital status: Divorced    Spouse name: Not on file   Number of children: 4   Years of education: 12   Highest education level: High school graduate  Occupational History   Not on file  Tobacco Use   Smoking status: Former    Packs/day: 0.25    Years: 45.00    Pack years: 11.25    Types: Cigarettes   Smokeless tobacco: Never   Tobacco comments:    patient states she smokes 1 cigarette per month  Vaping Use   Vaping Use: Never used  Substance and Sexual Activity   Alcohol use: Not Currently    Comment: 07/12/2017 "stopped 2 yr ago; did have a drink over the holidays recently"   Drug use: No   Sexual activity: Not Currently  Other Topics Concern   Not on file  Social History Narrative   Patient lives alone in [place].    Patient does not drive, she uses insurance transportation or her children.   Patient enjoys twirling her paton, listening to music, dancing, and spending time with family.    Patient enjoys celebrating all holidays and birthdays of loved ones.   Social Determinants of Health   Financial Resource Strain: Not on file  Food Insecurity: Not on file  Transportation Needs: Not on file  Physical Activity: Not on file  Stress: Not on file  Social Connections: Not on file   Additional Social History:                         Sleep: Fair  Appetite:  Fair  Current Medications: Current Facility-Administered Medications  Medication Dose Route Frequency Provider Last Rate Last Admin   acetaminophen (TYLENOL) tablet 650 mg  650 mg Oral Q4H PRN Clapacs, John T, MD   650 mg at 04/24/21 0214   acetaminophen (TYLENOL) tablet 650 mg   650 mg Oral Q6H PRN Clapacs, John T, MD   650 mg at 04/30/21 0046   albuterol (VENTOLIN HFA) 108 (90 Base) MCG/ACT inhaler 2 puff  2 puff Inhalation QID PRN Clapacs, John T, MD   2 puff at 04/27/21 2101   alum & mag hydroxide-simeth (MAALOX/MYLANTA) 200-200-20 MG/5ML suspension 30 mL  30 mL Oral Q4H PRN Clapacs, Madie Reno, MD   30 mL at 04/28/21 1942   amLODipine (  NORVASC) tablet 5 mg  5 mg Oral Daily Clapacs, John T, MD   5 mg at 05/01/21 1009   aspirin EC tablet 81 mg  81 mg Oral Daily Clapacs, Madie Reno, MD   81 mg at 05/01/21 0932   divalproex (DEPAKOTE) DR tablet 750 mg  750 mg Oral Q12H Clapacs, Madie Reno, MD   750 mg at 05/01/21 2751   hydrochlorothiazide (HYDRODIURIL) tablet 25 mg  25 mg Oral Daily Clapacs, Madie Reno, MD   25 mg at 05/01/21 1009   ibuprofen (ADVIL) tablet 800 mg  800 mg Oral Q8H PRN Clapacs, Madie Reno, MD   800 mg at 05/01/21 1317   levothyroxine (SYNTHROID) tablet 25 mcg  25 mcg Oral QAC breakfast Clapacs, Madie Reno, MD   25 mcg at 05/01/21 7001   magnesium hydroxide (MILK OF MAGNESIA) suspension 30 mL  30 mL Oral Daily PRN Clapacs, Madie Reno, MD       metFORMIN (GLUCOPHAGE) tablet 1,000 mg  1,000 mg Oral BID WC Clapacs, John T, MD   1,000 mg at 05/01/21 0854   methocarbamol (ROBAXIN) tablet 500 mg  500 mg Oral TID PRN Rulon Sera, MD   500 mg at 04/30/21 0046   metoprolol succinate (TOPROL-XL) 24 hr tablet 50 mg  50 mg Oral Daily Clapacs, Madie Reno, MD   50 mg at 05/01/21 1008   OLANZapine (ZYPREXA) injection 5 mg  5 mg Intramuscular BID PRN Clapacs, Madie Reno, MD   5 mg at 04/20/21 1930   pantoprazole (PROTONIX) EC tablet 40 mg  40 mg Oral Daily Clapacs, Madie Reno, MD   40 mg at 05/01/21 0854   potassium chloride (KLOR-CON) CR tablet 10 mEq  10 mEq Oral BID Clapacs, Madie Reno, MD   10 mEq at 05/01/21 0931   risperiDONE (RISPERDAL M-TABS) disintegrating tablet 2 mg  2 mg Oral BID Clapacs, Madie Reno, MD   2 mg at 05/01/21 1004    Lab Results:  Results for orders placed or performed during the hospital  encounter of 04/19/21 (from the past 48 hour(s))  Basic metabolic panel     Status: Abnormal   Collection Time: 05/01/21  5:45 AM  Result Value Ref Range   Sodium 130 (L) 135 - 145 mmol/L   Potassium 4.2 3.5 - 5.1 mmol/L   Chloride 93 (L) 98 - 111 mmol/L   CO2 29 22 - 32 mmol/L   Glucose, Bld 114 (H) 70 - 99 mg/dL    Comment: Glucose reference range applies only to samples taken after fasting for at least 8 hours.   BUN 21 8 - 23 mg/dL   Creatinine, Ser 1.23 (H) 0.44 - 1.00 mg/dL   Calcium 8.9 8.9 - 10.3 mg/dL   GFR, Estimated 48 (L) >60 mL/min    Comment: (NOTE) Calculated using the CKD-EPI Creatinine Equation (2021)    Anion gap 8 5 - 15    Comment: Performed at [facility], [address] [place], [place] [address]   *Note: Due to a large number of results and/or encounters for the requested time period, some results have not been displayed. A complete set of results can be found in Results Review.    Blood Alcohol level:  Lab Results  Component Value Date   ETH <10 04/17/2021   ETH <10 96/75/9163    Metabolic Disorder Labs: Lab Results  Component Value Date   HGBA1C 8.3 (H) 04/19/2021   MPG 192 04/19/2021   MPG 200 10/01/2020  No results found for: PROLACTIN Lab Results  Component Value Date   CHOL 166 04/19/2021   TRIG 213 (H) 04/19/2021   HDL 43 04/19/2021   CHOLHDL 3.9 04/19/2021   VLDL 43 (H) 04/19/2021   LDLCALC 80 04/19/2021   LDLCALC 180 (H) 10/01/2020    Physical Findings: AIMS:  , ,  ,  ,    CIWA:    COWS:     Musculoskeletal: Strength & Muscle Tone: within normal limits Gait & Station: normal Patient leans: N/A  Psychiatric Specialty Exam:  Presentation  General Appearance: Casual  Eye Contact:Fair  Speech:Pressured  Speech Volume:Increased  Handedness:Right   Mood and Affect  Mood:Anxious; Irritable  Affect:Congruent   Thought Process  Thought Processes:Goal Directed  Descriptions of  Associations:Intact  Orientation:Full (Time, Place and Person)  Thought Content:Paranoid Ideation; Perseveration; Illogical  History of Schizophrenia/Schizoaffective disorder:Yes  Duration of Psychotic Symptoms:Greater than six months  Hallucinations:AH, responding to unseen at times Ideas of Reference:Paranoia; Percusatory  Suicidal Thoughts:No data recorded Homicidal Thoughts:No data recorded  Sensorium  Memory:Immediate Fair; Recent Fair; Remote Poor  Judgment:Impaired  Insight:Shallow   Executive Functions  Concentration:Fair  Attention Span:Fair  [place]   Psychomotor Activity  Psychomotor Activity:No data recorded  Assets  Assets:Desire for Improvement; Financial Resources/Insurance   Sleep  Sleep:No data recorded   Physical Exam: Physical Exam Vitals and nursing note reviewed.  Constitutional:      Appearance: Normal appearance.  HENT:     Head: Normocephalic and atraumatic.     Mouth/Throat:     Pharynx: Oropharynx is clear.  Cardiovascular:     Rate and Rhythm: Normal rate and regular rhythm.  Pulmonary:     Effort: Pulmonary effort is normal.  Musculoskeletal:        General: Normal range of motion.  Neurological:     General: No focal deficit present.     Mental Status: She is alert. Mental status is at baseline.  Psychiatric:        Attention and Perception: Attention normal.        Speech: Speech is tangential.        Behavior: Behavior is cooperative.        Thought Content: Thought content is paranoid and delusional.        Cognition and Memory: Cognition is impaired.   Review of Systems  Constitutional: Negative.   HENT: Negative.    Eyes: Negative.   Respiratory: Negative.    Cardiovascular: Negative.   Gastrointestinal: Negative.   Musculoskeletal: Negative.   Skin: Negative.   Neurological: Negative.   Psychiatric/Behavioral: Negative.    Blood pressure 113/67, pulse 82,  temperature 97.6 F (36.4 C), temperature source Oral, resp. rate 18, SpO2 100 %. There is no height or weight on file to calculate BMI.   Treatment Plan Summary: Pt continue to be psychotic, paranoid, responding to unseen, refused hs  depakote.  Plan-  Cont risperdal for psychosis/mood. Received invea sustenna 234 mg IM on 10/26.  Encourage to take depakote for mood.  Will address medical issues as appropriate.  Group an dmilex tx as tolerated.   Lenward Chancellor, MD 05/01/2021, 2:57 PM

## 2021-05-01 NOTE — BH IP Treatment Plan (Addendum)
Interdisciplinary Treatment and Diagnostic Plan Update  05/01/2021 Time of Session: 10:30AM [name] Long MRN: [id number]  Principal Diagnosis: Schizoaffective disorder [facility])  Secondary Diagnoses: Principal Problem:   Schizoaffective disorder ([place]) Active Problems:   Hypothyroidism   Type 2 diabetes mellitus ([place])   HYPERCHOLESTEROLEMIA   HYPERTENSION, BENIGN SYSTEMIC   Hypokalemia   Atrial fibrillation ([place])   Current Medications:  Current Facility-Administered Medications  Medication Dose Route Frequency Provider Last Rate Last Admin   acetaminophen (TYLENOL) tablet 650 mg  650 mg Oral Q4H PRN Clapacs, Madie Reno, MD   650 mg at 04/24/21 0214   acetaminophen (TYLENOL) tablet 650 mg  650 mg Oral Q6H PRN Clapacs, Madie Reno, MD   650 mg at 04/30/21 0046   albuterol (VENTOLIN HFA) 108 (90 Base) MCG/ACT inhaler 2 puff  2 puff Inhalation QID PRN Clapacs, Madie Reno, MD   2 puff at 04/27/21 2101   alum & mag hydroxide-simeth (MAALOX/MYLANTA) 200-200-20 MG/5ML suspension 30 mL  30 mL Oral Q4H PRN Clapacs, John T, MD   30 mL at 04/28/21 1942   amLODipine (NORVASC) tablet 5 mg  5 mg Oral Daily Clapacs, Madie Reno, MD   5 mg at 05/01/21 1009   aspirin EC tablet 81 mg  81 mg Oral Daily Clapacs, Madie Reno, MD   81 mg at 05/01/21 0932   divalproex (DEPAKOTE) DR tablet 750 mg  750 mg Oral Q12H Clapacs, John T, MD   750 mg at 05/01/21 0931   hydrochlorothiazide (HYDRODIURIL) tablet 25 mg  25 mg Oral Daily Clapacs, John T, MD   25 mg at 05/01/21 1009   ibuprofen (ADVIL) tablet 800 mg  800 mg Oral Q8H PRN Clapacs, Madie Reno, MD   800 mg at 05/01/21 0124   levothyroxine (SYNTHROID) tablet 25 mcg  25 mcg Oral QAC breakfast Clapacs, Madie Reno, MD   25 mcg at 05/01/21 5638   magnesium hydroxide (MILK OF MAGNESIA) suspension 30 mL  30 mL Oral Daily PRN Clapacs, Madie Reno, MD       metFORMIN (GLUCOPHAGE) tablet 1,000 mg  1,000 mg Oral BID WC Clapacs, John T, MD   1,000 mg at 05/01/21 0854   methocarbamol (ROBAXIN) tablet 500 mg   500 mg Oral TID PRN Rulon Sera, MD   500 mg at 04/30/21 0046   metoprolol succinate (TOPROL-XL) 24 hr tablet 50 mg  50 mg Oral Daily Clapacs, Madie Reno, MD   50 mg at 05/01/21 1008   OLANZapine (ZYPREXA) injection 5 mg  5 mg Intramuscular BID PRN Clapacs, Madie Reno, MD   5 mg at 04/20/21 1930   pantoprazole (PROTONIX) EC tablet 40 mg  40 mg Oral Daily Clapacs, Madie Reno, MD   40 mg at 05/01/21 0854   potassium chloride (KLOR-CON) CR tablet 10 mEq  10 mEq Oral BID Clapacs, Madie Reno, MD   10 mEq at 05/01/21 0931   risperiDONE (RISPERDAL M-TABS) disintegrating tablet 2 mg  2 mg Oral BID Clapacs, Madie Reno, MD   2 mg at 05/01/21 1004   PTA Medications: Medications Prior to Admission  Medication Sig Dispense Refill Last Dose   albuterol (ACCUNEB) 1.25 MG/3ML nebulizer solution USE 1 VIAL VIA NEBULIZER EVERY 6 HOURS AS NEEDED FOR WHEEZING (Patient taking differently: Take 1 ampule by nebulization every 6 (six) hours as needed for shortness of breath or wheezing.) 75 mL 1    albuterol (VENTOLIN HFA) 108 (90 Base) MCG/ACT inhaler Inhale 2 puffs into the lungs 4 (four) times  daily as needed for wheezing or shortness of breath. 18 g 1    amLODipine (NORVASC) 5 MG tablet Take 1 tablet (5 mg total) by mouth daily. 90 tablet 1    Blood Glucose Monitoring Suppl (FREESTYLE LITE) w/Device KIT 1 each by Does not apply route daily. Dx:E11.9 1 kit 0    Blood Pressure Monitoring (BLOOD PRESSURE MONITOR/WRIST) KIT 1 Units by Does not apply route daily at 6 (six) AM. 1 kit 0    conjugated estrogens (PREMARIN) vaginal cream Place 1 Applicatorful vaginally at bedtime. 42.5 g 3    dapagliflozin propanediol (FARXIGA) 5 MG TABS tablet Take 1 tablet (5 mg total) by mouth daily. (Patient taking differently: Take 5 mg by mouth daily. Daughter states she is no longer taking this) 30 tablet 1    Divalproex Sodium (DEPAKOTE PO) Take 750 mg by mouth at bedtime.   More than a month   glucose blood (ACCU-CHEK GUIDE) test strip USE TO TEST BLOOD  SUGAR ONCE DAILY 100 strip 1    glucose blood (FREESTYLE LITE) test strip Use to test blood sugar one daily. Dx: E11.9 100 each 5    glucose blood (ONETOUCH VERIO) test strip Use to test blood sugar 2-3 times daily. Dx: E11.9 100 each 11    glucose blood test strip One Touch Ultra 2 Test Strips Use to test blood sugar once daily. Dx: E11.9 100 each 5    glucose blood test strip Use 2-3 times daily 100 each 12    hydrochlorothiazide (HYDRODIURIL) 25 MG tablet TAKE 1 TABLET(25 MG) BY MOUTH DAILY (Patient taking differently: Take 25 mg by mouth daily.) 30 tablet 3    ibuprofen (ADVIL) 800 MG tablet TAKE 1 TABLET(800 MG) BY MOUTH EVERY 8 HOURS AS NEEDED (Patient taking differently: Take 800 mg by mouth every 8 (eight) hours as needed for headache or moderate pain.) 30 tablet 0    Lancets (FREESTYLE) lancets Use to check blood sugar once daily. Dx: E11.9 100 each 5    levothyroxine (SYNTHROID) 25 MCG tablet TAKE 1 TABLET(25 MCG) BY MOUTH DAILY BEFORE BREAKFAST (Patient taking differently: Take 25 mcg by mouth daily before breakfast.) 30 tablet 3    LORazepam (ATIVAN) 0.5 MG tablet Take 0.5 mg by mouth at bedtime.   More than a month   metFORMIN (GLUCOPHAGE) 1000 MG tablet Take 1 tablet (1,000 mg total) by mouth 2 (two) times daily with a meal. 90 tablet 1    methocarbamol (ROBAXIN) 500 MG tablet Take 1 tablet (500 mg total) by mouth every 6 (six) hours as needed for muscle spasms. 30 tablet 0    metoprolol succinate (TOPROL-XL) 50 MG 24 hr tablet Take 1 tablet (50 mg total) by mouth daily. Take with or immediately following a meal. 30 tablet 3    omeprazole (PRILOSEC) 40 MG capsule Take 1 capsule (40 mg total) by mouth 2 (two) times daily. 60 capsule 1    penicillin v potassium (VEETID) 500 MG tablet Take 1 tablet (500 mg total) by mouth 4 (four) times daily. 120 tablet 0    potassium chloride (KLOR-CON) 10 MEQ tablet Take 1 tablet (10 mEq total) by mouth 2 (two) times daily. 60 tablet 3    potassium  chloride SA (KLOR-CON) 20 MEQ tablet Take 2 tablets (40 mEq total) by mouth once for 1 dose. 2 tablet 0    rosuvastatin (CRESTOR) 40 MG tablet Take 1 tablet (40 mg total) by mouth daily. 90 tablet 3    sitaGLIPtin (  JANUVIA) 50 MG tablet Take 1 tablet (50 mg total) by mouth daily. 90 tablet 1    sodium chloride 0.9 % infusion Inject into the vein once. 10 ml/hr      sucralfate (CARAFATE) 1 GM/10ML suspension SHAKE LIQUID AND TAKE 10 ML(1 GRAM) BY MOUTH FOUR TIMES DAILY (Patient taking differently: Take 1 g by mouth 4 (four) times daily.) 1200 mL 0     Patient Stressors: Loss of Father and husband 1 year ago    Patient Strengths: Supportive family/friends   Treatment Modalities: Medication Management, Group therapy, Case management,  1 to 1 session with clinician, Psychoeducation, Recreational therapy.   Physician Treatment Plan for Primary Diagnosis: Schizoaffective disorder ([place]) Long Term Goal(s): Improvement in symptoms so as ready for discharge   Short Term Goals: Compliance with prescribed medications will improve Ability to demonstrate self-control will improve Ability to identify and develop effective coping behaviors will improve  Medication Management: Evaluate patient's response, side effects, and tolerance of medication regimen.  Therapeutic Interventions: 1 to 1 sessions, Unit Group sessions and Medication administration.  Evaluation of Outcomes: Progressing  Physician Treatment Plan for Secondary Diagnosis: Principal Problem:   Schizoaffective disorder ([place]) Active Problems:   Hypothyroidism   Type 2 diabetes mellitus ([place])   HYPERCHOLESTEROLEMIA   HYPERTENSION, BENIGN SYSTEMIC   Hypokalemia   Atrial fibrillation ([facility])  Long Term Goal(s): Improvement in symptoms so as ready for discharge   Short Term Goals: Compliance with prescribed medications will improve Ability to demonstrate self-control will improve Ability to identify and develop effective coping  behaviors will improve     Medication Management: Evaluate patient's response, side effects, and tolerance of medication regimen.  Therapeutic Interventions: 1 to 1 sessions, Unit Group sessions and Medication administration.  Evaluation of Outcomes: Progressing   RN Treatment Plan for Primary Diagnosis: Schizoaffective disorder ([place]) Long Term Goal(s): Knowledge of disease and therapeutic regimen to maintain health will improve  Short Term Goals: Ability to remain free from injury will improve, Ability to verbalize frustration and anger appropriately will improve, Ability to demonstrate self-control, Ability to participate in decision making will improve, Ability to verbalize feelings will improve, Ability to identify and develop effective coping behaviors will improve, and Compliance with prescribed medications will improve  Medication Management: RN will administer medications as ordered by provider, will assess and evaluate patient's response and provide education to patient for prescribed medication. RN will report any adverse and/or side effects to prescribing provider.  Therapeutic Interventions: 1 on 1 counseling sessions, Psychoeducation, Medication administration, Evaluate responses to treatment, Monitor vital signs and CBGs as ordered, Perform/monitor CIWA, COWS, AIMS and Fall Risk screenings as ordered, Perform wound care treatments as ordered.  Evaluation of Outcomes: Progressing   LCSW Treatment Plan for Primary Diagnosis: Schizoaffective disorder ([place]) Long Term Goal(s): Safe transition to appropriate next level of care at discharge, Engage patient in therapeutic group addressing interpersonal concerns.  Short Term Goals: Engage patient in aftercare planning with referrals and resources, Increase social support, Increase ability to appropriately verbalize feelings, Increase emotional regulation, Facilitate acceptance of mental health diagnosis and concerns, Identify triggers  associated with mental health/substance abuse issues, and Increase skills for wellness and recovery  Therapeutic Interventions: Assess for all discharge needs, 1 to 1 time with Social worker, Explore available resources and support systems, Assess for adequacy in community support network, Educate family and significant other(s) on suicide prevention, Complete Psychosocial Assessment, Interpersonal group therapy.  Evaluation of Outcomes: Progressing   Progress in Treatment: Attending groups:  Yes. Participating in groups: Yes. Taking medication as prescribed: No. and As evidenced by:  Per chart review, patient has been refusing to take the full dose of Depakote. Toleration medication: Yes. Family/Significant other contact made: Yes, individual(s) contacted:  pt's daughter/guardian [name] Long Patient understands diagnosis: No. Discussing patient identified problems/goals with staff: No. Medical problems stabilized or resolved: Yes. Denies suicidal/homicidal ideation: Yes. Issues/concerns per patient self-inventory: No. Other: None.  New problem(s) identified: No, Describe:  none.  New Short Term/Long Term Goal(s): elimination of symptoms of psychosis, medication management for mood stabilization; elimination of SI thoughts; development of comprehensive mental wellness plan. Update 04/26/21: No changes at this time.  Update 05/01/2021: No changes at this time.   Patient Goals:  "took the Risperdal shots, now I'm ready to go"  Update 04/26/21: No changes at this time.  Update 05/01/2021: No changes at this time.   Discharge Plan or Barriers: CSW will assist pt with development of appropriate discharge/aftercare plan. Update 04/26/21: CSW spoke with pt's guardian [name] Long who stated that pt can return to her home, however pt's lease is up in December and will need a change is housing. CSW suggested guardian reach out to [facility] for  any housing alternative referrals. Update 05/01/2021: No changes at this time.   Reason for Continuation of Hospitalization: Aggression Delusions  Medication stabilization  Estimated Length of Stay: TBD   Scribe for Treatment Team: Sherilyn Dacosta 05/01/2021 11:55 AM

## 2021-05-01 NOTE — BH IP Treatment Plan (Unsigned)
Interdisciplinary Treatment and Diagnostic Plan Update  05/01/2021 Time of Session: 10:30AM CECIL BIXBY MRN: [id number]  Principal Diagnosis: Schizoaffective disorder [facility])  Secondary Diagnoses: Principal Problem:   Schizoaffective disorder ([place]) Active Problems:   Hypothyroidism   Type 2 diabetes mellitus ([place])   HYPERCHOLESTEROLEMIA   HYPERTENSION, BENIGN SYSTEMIC   Hypokalemia   Atrial fibrillation ([place])   Current Medications:  Current Facility-Administered Medications  Medication Dose Route Frequency Provider Last Rate Last Admin   acetaminophen (TYLENOL) tablet 650 mg  650 mg Oral Q4H PRN Clapacs, Madie Reno, MD   650 mg at 04/24/21 0214   acetaminophen (TYLENOL) tablet 650 mg  650 mg Oral Q6H PRN Clapacs, Madie Reno, MD   650 mg at 04/30/21 0046   albuterol (VENTOLIN HFA) 108 (90 Base) MCG/ACT inhaler 2 puff  2 puff Inhalation QID PRN Clapacs, Madie Reno, MD   2 puff at 04/27/21 2101   alum & mag hydroxide-simeth (MAALOX/MYLANTA) 200-200-20 MG/5ML suspension 30 mL  30 mL Oral Q4H PRN Clapacs, John T, MD   30 mL at 04/28/21 1942   amLODipine (NORVASC) tablet 5 mg  5 mg Oral Daily Clapacs, Madie Reno, MD   5 mg at 05/01/21 1009   aspirin EC tablet 81 mg  81 mg Oral Daily Clapacs, Madie Reno, MD   81 mg at 05/01/21 0932   divalproex (DEPAKOTE) DR tablet 750 mg  750 mg Oral Q12H Clapacs, John T, MD   750 mg at 05/01/21 0931   hydrochlorothiazide (HYDRODIURIL) tablet 25 mg  25 mg Oral Daily Clapacs, John T, MD   25 mg at 05/01/21 1009   ibuprofen (ADVIL) tablet 800 mg  800 mg Oral Q8H PRN Clapacs, Madie Reno, MD   800 mg at 05/01/21 0124   levothyroxine (SYNTHROID) tablet 25 mcg  25 mcg Oral QAC breakfast Clapacs, Madie Reno, MD   25 mcg at 05/01/21 5638   magnesium hydroxide (MILK OF MAGNESIA) suspension 30 mL  30 mL Oral Daily PRN Clapacs, Madie Reno, MD       metFORMIN (GLUCOPHAGE) tablet 1,000 mg  1,000 mg Oral BID WC Clapacs, John T, MD   1,000 mg at 05/01/21 0854   methocarbamol (ROBAXIN) tablet 500 mg   500 mg Oral TID PRN Rulon Sera, MD   500 mg at 04/30/21 0046   metoprolol succinate (TOPROL-XL) 24 hr tablet 50 mg  50 mg Oral Daily Clapacs, Madie Reno, MD   50 mg at 05/01/21 1008   OLANZapine (ZYPREXA) injection 5 mg  5 mg Intramuscular BID PRN Clapacs, Madie Reno, MD   5 mg at 04/20/21 1930   pantoprazole (PROTONIX) EC tablet 40 mg  40 mg Oral Daily Clapacs, Madie Reno, MD   40 mg at 05/01/21 0854   potassium chloride (KLOR-CON) CR tablet 10 mEq  10 mEq Oral BID Clapacs, Madie Reno, MD   10 mEq at 05/01/21 0931   risperiDONE (RISPERDAL M-TABS) disintegrating tablet 2 mg  2 mg Oral BID Clapacs, Madie Reno, MD   2 mg at 05/01/21 1004   PTA Medications: Medications Prior to Admission  Medication Sig Dispense Refill Last Dose   albuterol (ACCUNEB) 1.25 MG/3ML nebulizer solution USE 1 VIAL VIA NEBULIZER EVERY 6 HOURS AS NEEDED FOR WHEEZING (Patient taking differently: Take 1 ampule by nebulization every 6 (six) hours as needed for shortness of breath or wheezing.) 75 mL 1    albuterol (VENTOLIN HFA) 108 (90 Base) MCG/ACT inhaler Inhale 2 puffs into the lungs 4 (four) times  daily as needed for wheezing or shortness of breath. 18 g 1    amLODipine (NORVASC) 5 MG tablet Take 1 tablet (5 mg total) by mouth daily. 90 tablet 1    Blood Glucose Monitoring Suppl (FREESTYLE LITE) w/Device KIT 1 each by Does not apply route daily. Dx:E11.9 1 kit 0    Blood Pressure Monitoring (BLOOD PRESSURE MONITOR/WRIST) KIT 1 Units by Does not apply route daily at 6 (six) AM. 1 kit 0    conjugated estrogens (PREMARIN) vaginal cream Place 1 Applicatorful vaginally at bedtime. 42.5 g 3    dapagliflozin propanediol (FARXIGA) 5 MG TABS tablet Take 1 tablet (5 mg total) by mouth daily. (Patient taking differently: Take 5 mg by mouth daily. Daughter states she is no longer taking this) 30 tablet 1    Divalproex Sodium (DEPAKOTE PO) Take 750 mg by mouth at bedtime.   More than a month   glucose blood (ACCU-CHEK GUIDE) test strip USE TO TEST BLOOD  SUGAR ONCE DAILY 100 strip 1    glucose blood (FREESTYLE LITE) test strip Use to test blood sugar one daily. Dx: E11.9 100 each 5    glucose blood (ONETOUCH VERIO) test strip Use to test blood sugar 2-3 times daily. Dx: E11.9 100 each 11    glucose blood test strip One Touch Ultra 2 Test Strips Use to test blood sugar once daily. Dx: E11.9 100 each 5    glucose blood test strip Use 2-3 times daily 100 each 12    hydrochlorothiazide (HYDRODIURIL) 25 MG tablet TAKE 1 TABLET(25 MG) BY MOUTH DAILY (Patient taking differently: Take 25 mg by mouth daily.) 30 tablet 3    ibuprofen (ADVIL) 800 MG tablet TAKE 1 TABLET(800 MG) BY MOUTH EVERY 8 HOURS AS NEEDED (Patient taking differently: Take 800 mg by mouth every 8 (eight) hours as needed for headache or moderate pain.) 30 tablet 0    Lancets (FREESTYLE) lancets Use to check blood sugar once daily. Dx: E11.9 100 each 5    levothyroxine (SYNTHROID) 25 MCG tablet TAKE 1 TABLET(25 MCG) BY MOUTH DAILY BEFORE BREAKFAST (Patient taking differently: Take 25 mcg by mouth daily before breakfast.) 30 tablet 3    LORazepam (ATIVAN) 0.5 MG tablet Take 0.5 mg by mouth at bedtime.   More than a month   metFORMIN (GLUCOPHAGE) 1000 MG tablet Take 1 tablet (1,000 mg total) by mouth 2 (two) times daily with a meal. 90 tablet 1    methocarbamol (ROBAXIN) 500 MG tablet Take 1 tablet (500 mg total) by mouth every 6 (six) hours as needed for muscle spasms. 30 tablet 0    metoprolol succinate (TOPROL-XL) 50 MG 24 hr tablet Take 1 tablet (50 mg total) by mouth daily. Take with or immediately following a meal. 30 tablet 3    omeprazole (PRILOSEC) 40 MG capsule Take 1 capsule (40 mg total) by mouth 2 (two) times daily. 60 capsule 1    penicillin v potassium (VEETID) 500 MG tablet Take 1 tablet (500 mg total) by mouth 4 (four) times daily. 120 tablet 0    potassium chloride (KLOR-CON) 10 MEQ tablet Take 1 tablet (10 mEq total) by mouth 2 (two) times daily. 60 tablet 3    potassium  chloride SA (KLOR-CON) 20 MEQ tablet Take 2 tablets (40 mEq total) by mouth once for 1 dose. 2 tablet 0    rosuvastatin (CRESTOR) 40 MG tablet Take 1 tablet (40 mg total) by mouth daily. 90 tablet 3    sitaGLIPtin (  JANUVIA) 50 MG tablet Take 1 tablet (50 mg total) by mouth daily. 90 tablet 1    sodium chloride 0.9 % infusion Inject into the vein once. 10 ml/hr      sucralfate (CARAFATE) 1 GM/10ML suspension SHAKE LIQUID AND TAKE 10 ML(1 GRAM) BY MOUTH FOUR TIMES DAILY (Patient taking differently: Take 1 g by mouth 4 (four) times daily.) 1200 mL 0     Patient Stressors: Loss of Father and husband 1 year ago    Patient Strengths: Supportive family/friends   Treatment Modalities: Medication Management, Group therapy, Case management,  1 to 1 session with clinician, Psychoeducation, Recreational therapy.   Physician Treatment Plan for Primary Diagnosis: Schizoaffective disorder ([place]) Long Term Goal(s): Improvement in symptoms so as ready for discharge   Short Term Goals: Compliance with prescribed medications will improve Ability to demonstrate self-control will improve Ability to identify and develop effective coping behaviors will improve  Medication Management: Evaluate patient's response, side effects, and tolerance of medication regimen.  Therapeutic Interventions: 1 to 1 sessions, Unit Group sessions and Medication administration.  Evaluation of Outcomes: Progressing  Physician Treatment Plan for Secondary Diagnosis: Principal Problem:   Schizoaffective disorder ([place]) Active Problems:   Hypothyroidism   Type 2 diabetes mellitus ([place])   HYPERCHOLESTEROLEMIA   HYPERTENSION, BENIGN SYSTEMIC   Hypokalemia   Atrial fibrillation ([facility])  Long Term Goal(s): Improvement in symptoms so as ready for discharge   Short Term Goals: Compliance with prescribed medications will improve Ability to demonstrate self-control will improve Ability to identify and develop effective coping  behaviors will improve     Medication Management: Evaluate patient's response, side effects, and tolerance of medication regimen.  Therapeutic Interventions: 1 to 1 sessions, Unit Group sessions and Medication administration.  Evaluation of Outcomes: Progressing   RN Treatment Plan for Primary Diagnosis: Schizoaffective disorder ([place]) Long Term Goal(s): Knowledge of disease and therapeutic regimen to maintain health will improve  Short Term Goals: Ability to remain free from injury will improve, Ability to verbalize frustration and anger appropriately will improve, Ability to demonstrate self-control, Ability to participate in decision making will improve, Ability to verbalize feelings will improve, Ability to identify and develop effective coping behaviors will improve, and Compliance with prescribed medications will improve  Medication Management: RN will administer medications as ordered by provider, will assess and evaluate patient's response and provide education to patient for prescribed medication. RN will report any adverse and/or side effects to prescribing provider.  Therapeutic Interventions: 1 on 1 counseling sessions, Psychoeducation, Medication administration, Evaluate responses to treatment, Monitor vital signs and CBGs as ordered, Perform/monitor CIWA, COWS, AIMS and Fall Risk screenings as ordered, Perform wound care treatments as ordered.  Evaluation of Outcomes: Progressing   LCSW Treatment Plan for Primary Diagnosis: Schizoaffective disorder ([place]) Long Term Goal(s): Safe transition to appropriate next level of care at discharge, Engage patient in therapeutic group addressing interpersonal concerns.  Short Term Goals: Engage patient in aftercare planning with referrals and resources, Increase social support, Increase ability to appropriately verbalize feelings, Increase emotional regulation, Facilitate acceptance of mental health diagnosis and concerns, Identify triggers  associated with mental health/substance abuse issues, and Increase skills for wellness and recovery  Therapeutic Interventions: Assess for all discharge needs, 1 to 1 time with Social worker, Explore available resources and support systems, Assess for adequacy in community support network, Educate family and significant other(s) on suicide prevention, Complete Psychosocial Assessment, Interpersonal group therapy.  Evaluation of Outcomes: Progressing   Progress in Treatment: Attending groups:  No. Participating in groups: No. Taking medication as prescribed: Yes. Toleration medication: Yes. Family/Significant other contact made: Yes, individual(s) contacted:  pt's daughter/guardian Shelda Pal Patient understands diagnosis: No. Discussing patient identified problems/goals with staff: No. Medical problems stabilized or resolved: Yes. Denies suicidal/homicidal ideation: Yes. Issues/concerns per patient self-inventory: No. Other: None.  New problem(s) identified: No, Describe:  None.  New Short Term/Long Term Goal(s):   Patient Goals:    Discharge Plan or Barriers:   Reason for Continuation of Hospitalization: {[facility] Reasons for continued hospitalization:22604}  Estimated Length of Stay:   Scribe for Treatment Team: Sherilyn Dacosta 05/01/2021 10:51 AM

## 2021-05-02 NOTE — Progress Notes (Signed)
[facility] MD Progress Note  05/02/2021 12:53 PM ONI DIETZMAN  MRN:  [id number] Subjective and interval hx:  Im ok" Chart reviwed and pt seen.   70 year old woman transferred from [place].  Brought to the hospital with paranoia and reports by her family that she had been threatening to family members had been off her medicine and had not been taking care of herself.  Patient has poor insight.   she is disorganized and rambling.  Frequent paranoid comments about her family.  When left by herself in a room she was talking loudly to herself and appeared to be responding to internal stimuli and talking to hallucinations. Nursing reports - Patient continues with sundowning behaviors, agitated, irritable, yelling at staff members, threatening. Family member concerned with patient not showing any improvements. Patient did take medications after great hesitation. Noted Chiropractor names and threatening, Careers information officer told her daughter she could not bring her any clothes up here to hospital. Patient continues to be delusional, preoccupied and responding.  Encouragement and support provided. Safety checks maintained. Medications given as prescribed. Pt continues to be agitated on unit but remains safe with q 15 min checks.Patient noted responding to internal stimuli this am., pt refused  depakote and suspicious, delusional.   Patient in dayroom, suspicious, guarded, states she does not need a "mood stabilizer " like depakote because she is not bipolar, continues to be delusional and paranoid , guarded with me.   She does now have the first Invega injection at least.  No new complaints. Denies SI/HI.  Principal Problem: Schizoaffective disorder ([place]) Diagnosis: Principal Problem:   Schizoaffective disorder ([place]) Active Problems:   Hypothyroidism   Type 2 diabetes mellitus ([place])   HYPERCHOLESTEROLEMIA   HYPERTENSION, BENIGN SYSTEMIC   Hypokalemia   Atrial fibrillation ([place])  Total Time spent with  patient: 30 minutes  Past Psychiatric History: Past history of schizoaffective disorder multiple prior hospitalizations  Past Medical History:  Past Medical History:  Diagnosis Date   Abscessed tooth 02/09/2020   Acute on chronic congestive heart failure ([place])    Adenomatous colon polyp    Arthritis    "real bad; all over" (07/12/2017)   Asthma    Atrial fibrillation ([place])    BIPOLAR DISORDER 08/31/2006   Qualifier: Diagnosis of  By: Dorathy Daft MD, Marjory Lies     CHRONIC KIDNEY DISEASE STAGE II (MILD) 09/14/2009   Annotation: eGFR 90 Qualifier: Diagnosis of  By: Jess Barters MD, Erik     Chronic lower back pain    Congestive heart failure (CHF) ([place])    COPD (chronic obstructive pulmonary disease) ([place]) 09/23/2010   Diagnosed at [facility] (Dr. Annamaria Boots)    DM (diabetes mellitus) type II controlled with renal manifestation ([place]) 08/31/2006   Qualifier: Diagnosis of  By: Dorathy Daft MD, Marjory Lies     Dyspnea    "all my life; since 6th grade" (07/12/2017)   Fatty liver    HYPERCHOLESTEROLEMIA 08/31/2006   Intolerance to Lipitor OK on Crestor but medicaid no longer covering     HYPERTENSION, BENIGN SYSTEMIC 08/31/2006   Qualifier: Diagnosis of  By: Dorathy Daft MD, Aaron     Hypokalemia    HYPOTHYROIDISM, UNSPECIFIED 08/31/2006   Qualifier: Diagnosis of  By: Dorathy Daft MD, Marjory Lies     Leg swelling 03/14/2018   Pneumonia    "3 times" (07/12/2017)   Pulmonary nodule    Renal cyst    Schizophrenia ([place])    Scoliosis    Stomach problems    Thyroid disorder  Past Surgical History:  Procedure Laterality Date   CESAREAN SECTION     FOOT FRACTURE SURGERY Right    "steel plate in it"   FOOT SURGERY     " born w/dislocated foot"   FRACTURE SURGERY     KNEE ARTHROSCOPY Right    TOE SURGERY Bilateral    "both pinky toes"   TONSILLECTOMY AND ADENOIDECTOMY     Family History:  Family History  Problem Relation Age of Onset   Breast cancer Sister    Heart disease Mother 70   Rectal cancer Mother     Diabetes Father 45   Hypertension Father    Heart disease Brother    Asthma Daughter    Asthma Son    Breast cancer Sister    Asthma Daughter    Colon cancer Maternal Grandmother    Stomach cancer Neg Hx    Esophageal cancer Neg Hx    Pancreatic cancer Neg Hx    Family Psychiatric  History: See previous Social History:  Social History   Substance and Sexual Activity  Alcohol Use Not Currently   Comment: 07/12/2017 "stopped 2 yr ago; did have a drink over the holidays recently"     Social History   Substance and Sexual Activity  Drug Use No    Social History   Socioeconomic History   Marital status: Divorced    Spouse name: Not on file   Number of children: 4   Years of education: 12   Highest education level: High school graduate  Occupational History   Not on file  Tobacco Use   Smoking status: Former    Packs/day: 0.25    Years: 45.00    Pack years: 11.25    Types: Cigarettes   Smokeless tobacco: Never   Tobacco comments:    patient states she smokes 1 cigarette per month  Vaping Use   Vaping Use: Never used  Substance and Sexual Activity   Alcohol use: Not Currently    Comment: 07/12/2017 "stopped 2 yr ago; did have a drink over the holidays recently"   Drug use: No   Sexual activity: Not Currently  Other Topics Concern   Not on file  Social History Narrative   Patient lives alone in [place].    Patient does not drive, she uses insurance transportation or her children.   Patient enjoys twirling her paton, listening to music, dancing, and spending time with family.    Patient enjoys celebrating all holidays and birthdays of loved ones.   Social Determinants of Health   Financial Resource Strain: Not on file  Food Insecurity: Not on file  Transportation Needs: Not on file  Physical Activity: Not on file  Stress: Not on file  Social Connections: Not on file   Additional Social History:                         Sleep: Fair  Appetite:   Fair  Current Medications: Current Facility-Administered Medications  Medication Dose Route Frequency Provider Last Rate Last Admin   acetaminophen (TYLENOL) tablet 650 mg  650 mg Oral Q4H PRN Clapacs, John T, MD   650 mg at 04/24/21 0214   acetaminophen (TYLENOL) tablet 650 mg  650 mg Oral Q6H PRN Clapacs, John T, MD   650 mg at 04/30/21 0046   albuterol (VENTOLIN HFA) 108 (90 Base) MCG/ACT inhaler 2 puff  2 puff Inhalation QID PRN Clapacs, Madie Reno, MD   2 puff  at 04/27/21 2101   alum & mag hydroxide-simeth (MAALOX/MYLANTA) 200-200-20 MG/5ML suspension 30 mL  30 mL Oral Q4H PRN Clapacs, Madie Reno, MD   30 mL at 04/28/21 1942   amLODipine (NORVASC) tablet 5 mg  5 mg Oral Daily Clapacs, Madie Reno, MD   5 mg at 05/01/21 1009   aspirin EC tablet 81 mg  81 mg Oral Daily Clapacs, Madie Reno, MD   81 mg at 05/01/21 0932   divalproex (DEPAKOTE) DR tablet 750 mg  750 mg Oral Q12H Clapacs, John T, MD   750 mg at 05/01/21 2007   hydrochlorothiazide (HYDRODIURIL) tablet 25 mg  25 mg Oral Daily Clapacs, Madie Reno, MD   25 mg at 05/01/21 1009   ibuprofen (ADVIL) tablet 800 mg  800 mg Oral Q8H PRN Clapacs, Madie Reno, MD   800 mg at 05/02/21 1234   levothyroxine (SYNTHROID) tablet 25 mcg  25 mcg Oral QAC breakfast Clapacs, Madie Reno, MD   25 mcg at 05/02/21 2025   magnesium hydroxide (MILK OF MAGNESIA) suspension 30 mL  30 mL Oral Daily PRN Clapacs, Madie Reno, MD       metFORMIN (GLUCOPHAGE) tablet 1,000 mg  1,000 mg Oral BID WC Clapacs, John T, MD   1,000 mg at 05/02/21 4270   methocarbamol (ROBAXIN) tablet 500 mg  500 mg Oral TID PRN Rulon Sera, MD   500 mg at 04/30/21 0046   metoprolol succinate (TOPROL-XL) 24 hr tablet 50 mg  50 mg Oral Daily Clapacs, Madie Reno, MD   50 mg at 05/01/21 1008   OLANZapine (ZYPREXA) injection 5 mg  5 mg Intramuscular BID PRN Clapacs, Madie Reno, MD   5 mg at 04/20/21 1930   pantoprazole (PROTONIX) EC tablet 40 mg  40 mg Oral Daily Clapacs, Madie Reno, MD   40 mg at 05/02/21 6237   potassium chloride  (KLOR-CON) CR tablet 10 mEq  10 mEq Oral BID Clapacs, Madie Reno, MD   10 mEq at 05/01/21 2021   risperiDONE (RISPERDAL M-TABS) disintegrating tablet 2 mg  2 mg Oral BID Clapacs, Madie Reno, MD   2 mg at 05/01/21 2008    Lab Results:  Results for orders placed or performed during the hospital encounter of 04/19/21 (from the past 48 hour(s))  Basic metabolic panel     Status: Abnormal   Collection Time: 05/01/21  5:45 AM  Result Value Ref Range   Sodium 130 (L) 135 - 145 mmol/L   Potassium 4.2 3.5 - 5.1 mmol/L   Chloride 93 (L) 98 - 111 mmol/L   CO2 29 22 - 32 mmol/L   Glucose, Bld 114 (H) 70 - 99 mg/dL    Comment: Glucose reference range applies only to samples taken after fasting for at least 8 hours.   BUN 21 8 - 23 mg/dL   Creatinine, Ser 1.23 (H) 0.44 - 1.00 mg/dL   Calcium 8.9 8.9 - 10.3 mg/dL   GFR, Estimated 48 (L) >60 mL/min    Comment: (NOTE) Calculated using the CKD-EPI Creatinine Equation (2021)    Anion gap 8 5 - 15    Comment: Performed at [facility], [address] [place], [place] [address]   *Note: Due to a large number of results and/or encounters for the requested time period, some results have not been displayed. A complete set of results can be found in Results Review.    Blood Alcohol level:  Lab Results  Component Value Date   ETH <10  04/17/2021   ETH <10 37/85/8850    Metabolic Disorder Labs: Lab Results  Component Value Date   HGBA1C 8.3 (H) 04/19/2021   MPG 192 04/19/2021   MPG 200 10/01/2020   No results found for: PROLACTIN Lab Results  Component Value Date   CHOL 166 04/19/2021   TRIG 213 (H) 04/19/2021   HDL 43 04/19/2021   CHOLHDL 3.9 04/19/2021   VLDL 43 (H) 04/19/2021   LDLCALC 80 04/19/2021   LDLCALC 180 (H) 10/01/2020    Physical Findings: AIMS:  , ,  ,  ,    CIWA:    COWS:     Musculoskeletal: Strength & Muscle Tone: within normal limits Gait & Station: normal Patient leans: N/A  Psychiatric Specialty  Exam:  Presentation  General Appearance: Casual  Eye Contact:Fair  Speech:Pressured  Speech Volume:Increased  Handedness:Right   Mood and Affect  Mood:Anxious; Irritable  Affect:Congruent   Thought Process  Thought Processes:Goal Directed  Descriptions of Associations:Intact  Orientation:Full (Time, Place and Person)  Thought Content:Paranoid Ideation; Perseveration; Illogical  History of Schizophrenia/Schizoaffective disorder:Yes  Duration of Psychotic Symptoms:Greater than six months  Hallucinations:AH, responding to unseen at times Ideas of Reference:Paranoia; Percusatory  Suicidal Thoughts:No data recorded Homicidal Thoughts:No data recorded  Sensorium  Memory:Immediate Fair; Recent Fair; Remote Poor  Judgment:Impaired  Insight:Shallow   Executive Functions  Concentration:Fair  Attention Span:Fair  [place]   Psychomotor Activity  Psychomotor Activity:No data recorded  Assets  Assets:Desire for Improvement; Financial Resources/Insurance   Sleep  Sleep:No data recorded   Physical Exam: Physical Exam Vitals and nursing note reviewed.  Constitutional:      Appearance: Normal appearance.  HENT:     Head: Normocephalic and atraumatic.     Mouth/Throat:     Pharynx: Oropharynx is clear.  Cardiovascular:     Rate and Rhythm: Normal rate and regular rhythm.  Pulmonary:     Effort: Pulmonary effort is normal.  Musculoskeletal:        General: Normal range of motion.  Neurological:     General: No focal deficit present.     Mental Status: She is alert.  Psychiatric:        Attention and Perception: Attention normal.        Speech: Speech is tangential.        Behavior: Behavior is cooperative.        Thought Content: Thought content is paranoid and delusional.        Cognition and Memory: Cognition is impaired.   Review of Systems  Constitutional: Negative.   HENT: Negative.    Eyes:  Negative.   Respiratory: Negative.    Cardiovascular: Negative.   Gastrointestinal: Negative.   Musculoskeletal: Negative.   Skin: Negative.   Neurological: Negative.   Psychiatric/Behavioral: Negative.    Blood pressure 127/72, pulse 94, temperature 97.6 F (36.4 C), temperature source Oral, resp. rate 18, SpO2 100 %. There is no height or weight on file to calculate BMI.   Treatment Plan Summary: Pt continue to be psychotic, paranoid, labile, responding to unseen, refused  depakote.  Plan-  Cont risperdal for psychosis/mood. Received invea sustenna 234 mg IM on 10/26.  Encourage to take depakote for mood.  Will address medical issues as appropriate.  Group an dmilex tx as tolerated.   Lenward Chancellor, MD 05/02/2021, 12:53 PM

## 2021-05-02 NOTE — Progress Notes (Signed)
Patient noted responding to internal stimuli this am.

## 2021-05-02 NOTE — Progress Notes (Signed)
Patient is calm and pleasant on approach. She asks for the head of her bed to be adjusted in her room and is appreciative, saying it will help her sleep better. Patient speech is tangential and she is suspicious of her family. She also continues to ask if family members will bring in additional clothes and her pocketbook.   Patient was compliant with all scheduled medications. However, when given medication education, patient stated, "I don't understand why I am taking all these medications. I used to take Depakote for depression, but I am not schizophrenic. I also took the shot already so why do I need to take these?" She also complained of back pain, which she rated as a 7/10. Ibuprofen PRN was given.   Patient's ankles were assessed. There is some non-pitting edema in both the right and left lower extremities. Patient denies any pain in this area.
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
## 2021-05-02 NOTE — Plan of Care (Signed)
  Problem: Coping: Goal: Level of anxiety will decrease Outcome: Not Progressing   Problem: Safety: Goal: Ability to remain free from injury will improve Outcome: Progressing   

## 2021-05-02 NOTE — Progress Notes (Signed)
Patient has been heard on several occassions, in her room talking to herself. She just came up to the nurse's station asking for a stack of papers, "that's it right there, I'm still writing my book, that's why you hear me talking". This Probation officer gave patient her papers back, and she walked back to her room.

## 2021-05-02 NOTE — Progress Notes (Signed)
Patient continues with sundowning behaviors, agitated, irritable, yelling at staff members, threatening. Family member concerned with patient not showing any improvements. Patient did take medications after great hesitation. Noted Chiropractor names and threatening, Careers information officer told her daughter she could not bring her any clothes up here to hospital. Patient continues to be delusional, preoccupied and responding.  Encouragement and support provided. Safety checks maintained. Medications given as prescribed. Pt continues to be agitated on unit but remains safe with q 15 min checks.

## 2021-05-02 NOTE — Progress Notes (Signed)
Patient came to this writer asking if she could have her cane because "my knees are weak". Patient also reports that she brought her cane with her from [facility], however, when this writer went to check the patient storage room, the only belongings in patient's locker were her shoe strings.

## 2021-05-02 NOTE — Progress Notes (Signed)
Patient came to the nurses station asking if the doctor changed her medicine. This writer explained to patient that there have not been any new orders placed for her. Patient then states "I'll take what you have for me, instead of that Risperidone, because I got the shot, and that's too much medicine for me". This writer went to get patient's morning medication and she took them all, and tolerated them well, without any issues. There have been issues with patient and her meal trays. This Probation officer explained to patient that the menu that she fills out in the mornings are for the following day, not present day. This Probation officer also explained that whatever diet the doctor has her on, the menu will correspond, and that she will not be getting any food that she is not supposed to have. Patient continues to need reinforcement on these issues.

## 2021-05-02 NOTE — Progress Notes (Signed)
Patient stated that she is not taking any medication until she gets all of her medication. Patient states that she does not have her "Latex and Geneva". This Probation officer assumes that patient is referring to Lasix and Januvia. Patient stated that she has been taking these medications for "forty years, I didn't get it yesterday or today and I'm not taking any medication, until I get all of my medication. I'm a nurse too". This Probation officer will notify MD.

## 2021-05-02 NOTE — Group Note (Signed)
LCSW Group Therapy Note  Group Date: 05/02/2021 Start Time: 2633 End Time: 1730   Type of Therapy and Topic:  Group Therapy - How To Cope with Nervousness about Discharge   Participation Level:  Active   Description of Group This process group involved identification of patients' feelings about discharge. Some of them are scheduled to be discharged soon, while others are new admissions, but each of them was asked to share thoughts and feelings surrounding discharge from the hospital. One common theme was that they are excited at the prospect of going home, while another was that many of them are apprehensive about sharing why they were hospitalized. Patients were given the opportunity to discuss these feelings with their peers in preparation for discharge.  Therapeutic Goals  Patient will identify their overall feelings about pending discharge. Patient will think about how they might proactively address issues that they believe will once again arise once they get home (i.e. with parents). Patients will participate in discussion about having hope for change.   Summary of Patient Progress: Patient was present for the first half of group. Patient left group early and placed a phone call. Patient stated that she will be evicted from her home at the end of this month. Patient states she plans to move to a hotel and put her belongings in storage. Patient stated that she is looking forward to moving because she wants to be by herself until she feels better. Patient handed CSW about 10 pieces of notebook paper that patient had written dated journal entries on. Patient shared that the paper explained what had led up to her current hospitalization. CSW was unable to make sense of what patient had written. During group, patient was observed pointing and making motions with her hands. Patient appeared to be responding to internal stimuli.    Therapeutic Modalities Cognitive Behavioral  Therapy   Berniece Salines, Latanya Presser 05/02/2021  5:55 PM

## 2021-05-03 NOTE — Plan of Care (Signed)
Patient is active in the milieu. Pleasant and cooperative. Ate breakfast and received AM medications. BP medications held.

## 2021-05-03 NOTE — Progress Notes (Addendum)
Patient is calm and cooperative. Pt denies SI and HI at this time. Pt is still responding to stimuli and is observed having audible conversations with herself in the room. Pt is medication complaint. Pt voiced a complaint of pain on her left shoulder rated 7/10 (see MAR). Pt is monitored q 15 minutes for safety.

## 2021-05-03 NOTE — Progress Notes (Signed)
Patient out of bed and active in the milieu. Frequently using the phone. Pleasant and cooperative. Currently eating breakfast with no sign of distress.  Staff continue to provide support and encouragements. Safety maintained.

## 2021-05-03 NOTE — Group Note (Signed)
[facility] LCSW Group Therapy Note    Group Date: 05/03/2021 Start Time: 1430 End Time: 1500  Type of Therapy and Topic:  Group Therapy:  Overcoming Obstacles  Participation Level:  [facility] PARTICIPATION LEVEL: Minimal    Description of Group:   In this group patients will be encouraged to explore what they see as obstacles to their own wellness and recovery. They will be guided to discuss their thoughts, feelings, and behaviors related to these obstacles. The group will process together ways to cope with barriers, with attention given to specific choices patients can make. Each patient will be challenged to identify changes they are motivated to make in order to overcome their obstacles. This group will be process-oriented, with patients participating in exploration of their own experiences as well as giving and receiving support and challenge from other group members.  Therapeutic Goals: 1. Patient will identify personal and current obstacles as they relate to admission. 2. Patient will identify barriers that currently interfere with their wellness or overcoming obstacles.  3. Patient will identify feelings, thought process and behaviors related to these barriers. 4. Patient will identify two changes they are willing to make to overcome these obstacles:    Summary of Patient Progress   Patient was present for the entirety of group. Patient had a upbeat demeanor and intermittently participated in the topic of discussion. She did not state any specifics regarding feelings or changes she wants to make. However she did state she is attempting to change her housing situation because she has to move in the upcoming months.    Therapeutic Modalities:   Cognitive Behavioral Therapy Solution Focused Therapy Motivational Interviewing Relapse Prevention Therapy   Kavi Almquist A [name], LCSWA

## 2021-05-03 NOTE — Progress Notes (Signed)
[facility] MD Progress Note  05/03/2021 3:05 PM [name] Long  MRN:  [id number] Subjective: Follow-up for 70 year old woman with schizoaffective disorder.  Patient seen and chart reviewed.  Patient and I had a chat this morning and as long as we stay on topics other than her discharge plan she remains pleasant and pretty calm.  She talked with me about her father's job in the past.  When it veered into some of her history and started to sound possibly delusional but not bizarre and not threatening.  She still mentions things about how there is someone impersonating her somewhere and how she needs to go secure her money but in general she is pretty calm.  When I informed her that we still were not planning for discharge today she once again informed me that I would be arrested and sued but at least she did not shout at me.  She does seem to be compliant with her medicine regularly.  [name] Bender Sustenna shot was given 5 days ago Principal Problem: Schizoaffective disorder ([place]) Diagnosis: Principal Problem:   Schizoaffective disorder ([place]) Active Problems:   Hypothyroidism   Type 2 diabetes mellitus ([place])   HYPERCHOLESTEROLEMIA   HYPERTENSION, BENIGN SYSTEMIC   Hypokalemia   Atrial fibrillation (HCC)  Total Time spent with patient: 30 minutes  Past Psychiatric History: Long history of schizoaffective disorder  Past Medical History:  Past Medical History:  Diagnosis Date   Abscessed tooth 02/09/2020   Acute on chronic congestive heart failure ([place])    Adenomatous colon polyp    Arthritis    "real bad; all over" (07/12/2017)   Asthma    Atrial fibrillation ([place])    BIPOLAR DISORDER 08/31/2006   Qualifier: Diagnosis of  By: Dorathy Daft MD, Marjory Lies     CHRONIC KIDNEY DISEASE STAGE II (MILD) 09/14/2009   Annotation: eGFR 49 Qualifier: Diagnosis of  By: Jess Barters MD, Erik     Chronic lower back pain    Congestive heart failure (CHF) (HCC)    COPD (chronic obstructive pulmonary disease) ([place]) 09/23/2010    Diagnosed at [facility] in 2008 (Dr. Annamaria Boots)    DM (diabetes mellitus) type II controlled with renal manifestation ([place]) 08/31/2006   Qualifier: Diagnosis of  By: Dorathy Daft MD, Marjory Lies     Dyspnea    "all my life; since 6th grade" (07/12/2017)   Fatty liver    HYPERCHOLESTEROLEMIA 08/31/2006   Intolerance to Lipitor OK on Crestor but medicaid no longer covering     HYPERTENSION, BENIGN SYSTEMIC 08/31/2006   Qualifier: Diagnosis of  By: Dorathy Daft MD, Aaron     Hypokalemia    HYPOTHYROIDISM, UNSPECIFIED 08/31/2006   Qualifier: Diagnosis of  By: Dorathy Daft MD, Marjory Lies     Leg swelling 03/14/2018   Pneumonia    "3 times" (07/12/2017)   Pulmonary nodule    Renal cyst    Schizophrenia ([place])    Scoliosis    Stomach problems    Thyroid disorder     Past Surgical History:  Procedure Laterality Date   CESAREAN SECTION     FOOT FRACTURE SURGERY Right    "steel plate in it"   FOOT SURGERY     " born w/dislocated foot"   FRACTURE SURGERY     KNEE ARTHROSCOPY Right    TOE SURGERY Bilateral    "both pinky toes"   TONSILLECTOMY AND ADENOIDECTOMY     Family History:  Family History  Problem Relation Age of Onset   Breast cancer Sister    Heart disease  Mother 34   Rectal cancer Mother    Diabetes Father 84   Hypertension Father    Heart disease Brother    Asthma Daughter    Asthma Son    Breast cancer Sister    Asthma Daughter    Colon cancer Maternal Grandmother    Stomach cancer Neg Hx    Esophageal cancer Neg Hx    Pancreatic cancer Neg Hx    Family Psychiatric  History: See previous Social History:  Social History   Substance and Sexual Activity  Alcohol Use Not Currently   Comment: 07/12/2017 "stopped 2 yr ago; did have a drink over the holidays recently"     Social History   Substance and Sexual Activity  Drug Use No    Social History   Socioeconomic History   Marital status: Divorced    Spouse name: Not on file   Number of children: 4   Years of education: 12   Highest  education level: High school graduate  Occupational History   Not on file  Tobacco Use   Smoking status: Former    Packs/day: 0.25    Years: 45.00    Pack years: 11.25    Types: Cigarettes   Smokeless tobacco: Never   Tobacco comments:    patient states she smokes 1 cigarette per month  Vaping Use   Vaping Use: Never used  Substance and Sexual Activity   Alcohol use: Not Currently    Comment: 07/12/2017 "stopped 2 yr ago; did have a drink over the holidays recently"   Drug use: No   Sexual activity: Not Currently  Other Topics Concern   Not on file  Social History Narrative   Patient lives alone in [place].    Patient does not drive, she uses insurance transportation or her children.   Patient enjoys twirling her paton, listening to music, dancing, and spending time with family.    Patient enjoys celebrating all holidays and birthdays of loved ones.   Social Determinants of Health   Financial Resource Strain: Not on file  Food Insecurity: Not on file  Transportation Needs: Not on file  Physical Activity: Not on file  Stress: Not on file  Social Connections: Not on file   Additional Social History:                         Sleep: Fair  Appetite:  Fair  Current Medications: Current Facility-Administered Medications  Medication Dose Route Frequency Provider Last Rate Last Admin   acetaminophen (TYLENOL) tablet 650 mg  650 mg Oral Q4H PRN Sheela Mcculley T, MD   650 mg at 04/24/21 0214   acetaminophen (TYLENOL) tablet 650 mg  650 mg Oral Q6H PRN Natalija Mavis T, MD   650 mg at 04/30/21 0046   albuterol (VENTOLIN HFA) 108 (90 Base) MCG/ACT inhaler 2 puff  2 puff Inhalation QID PRN Gaynor Ferreras T, MD   2 puff at 04/27/21 2101   alum & mag hydroxide-simeth (MAALOX/MYLANTA) 200-200-20 MG/5ML suspension 30 mL  30 mL Oral Q4H PRN Elda Dunkerson T, MD   30 mL at 05/03/21 1328   amLODipine (NORVASC) tablet 5 mg  5 mg Oral Daily Benecio Kluger T, MD   5 mg at 05/02/21 1645    aspirin EC tablet 81 mg  81 mg Oral Daily Ermel Verne T, MD   81 mg at 05/03/21 1042   divalproex (DEPAKOTE) DR tablet 750 mg  750 mg Oral Q12H  Nevah Dalal, Madie Reno, MD   750 mg at 05/03/21 1041   hydrochlorothiazide (HYDRODIURIL) tablet 25 mg  25 mg Oral Daily Esiah Bazinet, Madie Reno, MD   25 mg at 05/02/21 1646   ibuprofen (ADVIL) tablet 800 mg  800 mg Oral Q8H PRN Stellarose Cerny T, MD   800 mg at 05/03/21 1433   levothyroxine (SYNTHROID) tablet 25 mcg  25 mcg Oral QAC breakfast Mohan Erven, Madie Reno, MD   25 mcg at 05/03/21 7517   magnesium hydroxide (MILK OF MAGNESIA) suspension 30 mL  30 mL Oral Daily PRN Jeromiah Ohalloran T, MD       metFORMIN (GLUCOPHAGE) tablet 1,000 mg  1,000 mg Oral BID WC Malonie Tatum T, MD   1,000 mg at 05/03/21 0732   methocarbamol (ROBAXIN) tablet 500 mg  500 mg Oral TID PRN Rulon Sera, MD   500 mg at 05/03/21 0017   metoprolol succinate (TOPROL-XL) 24 hr tablet 50 mg  50 mg Oral Daily Denita Lun T, MD   50 mg at 05/02/21 1645   OLANZapine (ZYPREXA) injection 5 mg  5 mg Intramuscular BID PRN Shellia Hartl T, MD   5 mg at 04/20/21 1930   pantoprazole (PROTONIX) EC tablet 40 mg  40 mg Oral Daily Kymora Sciara T, MD   40 mg at 05/03/21 0732   potassium chloride (KLOR-CON) CR tablet 10 mEq  10 mEq Oral BID Yakir Wenke T, MD   10 mEq at 05/03/21 1042   risperiDONE (RISPERDAL M-TABS) disintegrating tablet 2 mg  2 mg Oral BID Dhaval Woo, Madie Reno, MD   2 mg at 05/03/21 1042    Lab Results: No results found. However, due to the size of the patient record, not all encounters were searched. Please check Results Review for a complete set of results.  Blood Alcohol level:  Lab Results  Component Value Date   ETH <10 04/17/2021   ETH <10 49/44/9675    Metabolic Disorder Labs: Lab Results  Component Value Date   HGBA1C 8.3 (H) 04/19/2021   MPG 192 04/19/2021   MPG 200 10/01/2020   No results found for: PROLACTIN Lab Results  Component Value Date   CHOL 166 04/19/2021   TRIG 213  (H) 04/19/2021   HDL 43 04/19/2021   CHOLHDL 3.9 04/19/2021   VLDL 43 (H) 04/19/2021   LDLCALC 80 04/19/2021   LDLCALC 180 (H) 10/01/2020    Physical Findings: AIMS:  , ,  ,  ,    CIWA:    COWS:     Musculoskeletal: Strength & Muscle Tone: within normal limits Gait & Station: normal Patient leans: N/A  Psychiatric Specialty Exam:  Presentation  General Appearance: Casual  Eye Contact:Fair  Speech:Pressured  Speech Volume:Increased  Handedness:Right   Mood and Affect  Mood:Anxious; Irritable  Affect:Congruent   Thought Process  Thought Processes:Goal Directed  Descriptions of Associations:Intact  Orientation:Full (Time, Place and Person)  Thought Content:Paranoid Ideation; Perseveration; Illogical  History of Schizophrenia/Schizoaffective disorder:Yes  Duration of Psychotic Symptoms:Greater than six months  Hallucinations:No data recorded Ideas of Reference:Paranoia; Percusatory  Suicidal Thoughts:No data recorded Homicidal Thoughts:No data recorded  Sensorium  Memory:Immediate Fair; Recent Fair; Remote Poor  Judgment:Impaired  Insight:Shallow   Executive Functions  Concentration:Fair  Attention Span:Fair  [place]   Psychomotor Activity  Psychomotor Activity:No data recorded  Assets  Assets:Desire for Improvement; Financial Resources/Insurance   Sleep  Sleep:No data recorded   Physical Exam: Physical Exam Vitals and nursing note reviewed.  Constitutional:      Appearance: Normal appearance.  HENT:     Head: Normocephalic and atraumatic.     Mouth/Throat:     Pharynx: Oropharynx is clear.  Eyes:     Pupils: Pupils are equal, round, and reactive to light.  Cardiovascular:     Rate and Rhythm: Normal rate and regular rhythm.  Pulmonary:     Effort: Pulmonary effort is normal.     Breath sounds: Normal breath sounds.  Abdominal:     General: Abdomen is flat.     Palpations:  Abdomen is soft.  Musculoskeletal:        General: Normal range of motion.  Skin:    General: Skin is warm and dry.  Neurological:     General: No focal deficit present.     Mental Status: She is alert. Mental status is at baseline.  Psychiatric:        Attention and Perception: Attention normal.        Mood and Affect: Mood normal.        Speech: Speech normal.        Behavior: Behavior normal.        Thought Content: Thought content is delusional.        Cognition and Memory: Memory is impaired.        Judgment: Judgment is inappropriate.   Review of Systems  Constitutional: Negative.   HENT: Negative.    Eyes: Negative.   Respiratory: Negative.    Cardiovascular: Negative.   Gastrointestinal: Negative.   Musculoskeletal: Negative.   Skin: Negative.   Neurological: Negative.   Psychiatric/Behavioral: Negative.    Blood pressure 96/71, pulse 78, temperature 97.6 F (36.4 C), temperature source Oral, resp. rate 20, SpO2 100 %. There is no height or weight on file to calculate BMI.   Treatment Plan Summary: Medication management and Plan patient is slightly improved.  I am not yet sure whether she would be safe going home in her current condition.  She seems a lot less emotionally agitated which is good.  She is taking the full dose of Depakote that I had ordered now and I will order a Depakote level tomorrow.  She will be due for a second Invega shot in 2 days.  Possibly ready for discharge this week.  Continue medicine for now.  She was irritated today because nursing held her amlodipine.  We pointed out to her that they did it for a very good reason that her blood pressure was too low this morning.  Alethia Berthold, MD 05/03/2021, 3:05 PM

## 2021-05-03 NOTE — Progress Notes (Signed)
Recreation Therapy Notes    Date: 05/03/2021  Time: 1:30 pm  Location: Day room   Behavioral response: Appropriate  Intervention Topic: Happiness     Discussion/Intervention:  Group content today was focused on Happiness. The group defined happiness and described where happiness comes from. Individuals identified what makes them happy and how they go about making others happy. Patients expressed things that stop them from being happy and ways they can improve their happiness. The group stated reasons why it is important to be happy. The group participated in the intervention "My Happiness", where they had a chance to identify and express things that make them happy. Clinical Observations/Feedback: Patient came to group and was focused on what peers and staff had to say about happiness. She identified cooking with her family and talking are things that make her happy. 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 Patient started responding to internal stimuli towards the end of group.  Hubbert Landrigan LRT/CTRS         Macey Wurtz 05/03/2021 3:49 PM

## 2021-05-04 LAB — VALPROIC ACID LEVEL: Valproic Acid Lvl: 66 ug/mL (ref 50.0–100.0)

## 2021-05-04 NOTE — Progress Notes (Signed)
MD with patient

## 2021-05-04 NOTE — Progress Notes (Signed)
Pt has has been in dayroom for the majority of the day.  She has made several phone calls to her family.  She states that [place] (her Guardian) only wants to keep her in here to take her money and her house.  Ms Crute insists that [name] is not her guardian and that we need to "review the paperwork."  Social work was made aware of this earlier and is attempting to obtain copies of this paperwork for pt file.  Ms Huhta has been noted several times during the day responding to internal stimuli, when staff asked she became very agitated.  Cont to monitor pt as ordered

## 2021-05-04 NOTE — Group Note (Signed)
[facility] LCSW Group Therapy Note   Group Date: 05/04/2021 Start Time: 1430 End Time: 1500   Type of Therapy/Topic:  Group Therapy:  Emotion Regulation  Participation Level:  Active     Description of Group:    The purpose of this group is to assist patients in learning to regulate negative emotions and experience positive emotions. Patients will be guided to discuss ways in which they have been vulnerable to their negative emotions. These vulnerabilities will be juxtaposed with experiences of positive emotions or situations, and patients challenged to use positive emotions to combat negative ones. Special emphasis will be placed on coping with negative emotions in conflict situations, and patients will process healthy conflict resolution skills.  Therapeutic Goals: Patient will identify two positive emotions or experiences to reflect on in order to balance out negative emotions:  Patient will label two or more emotions that they find the most difficult to experience:  Patient will be able to demonstrate positive conflict resolution skills through discussion or role plays:   Summary of Patient Progress:   Patient was present for half of the group session. Patient was an active listener and participated in the topic of discussion, and added nuance to topic of conversation. Patient had a pleasant mood for activity and was appropriately engaged. She stated that she learned the hard way that her past friend was not reliable when she really needed his help. She said that in situations that make her uncomfortable, she has learned that walking away and leaving is the best way for her to stay emotionally regulated.      Therapeutic Modalities:   Cognitive Behavioral Therapy Feelings Identification Dialectical Behavioral Therapy   Weldon Nouri A [name], LCSWA

## 2021-05-04 NOTE — Progress Notes (Addendum)
On initial round after report Pt is sitting in dayroom watching TV w/o s/s of distress.  Pt is currently interacting internal stimuli.  Pt is talking aloud to self as well as making hand gestures. Will continue to monitor throughout shift as ordered for any changes in behaviors and for continued safety.

## 2021-05-04 NOTE — Progress Notes (Signed)
Pt stated to staff that her pain is "8/10 in my hyperthyroidism".  Staff attempted to identify pain as possible sore throat or maybe a stiff/crick in the neck, however [name] Long insists that the pain is in her hyperthyroidism and becomes agitated if staff attempts to clarify this statement.

## 2021-05-04 NOTE — Progress Notes (Signed)
Recreation Therapy Notes   Date: 05/04/2021  Time: 1:30 pm  Location: Court yard   Behavioral response: N/A   Intervention Topic: Relaxation    Discussion/Intervention: Patient did not attend group.   Clinical Observations/Feedback:  Patient did not attend group.   Juaquin Ludington LRT/CTRS        Srihan Brutus 05/04/2021 4:16 PM

## 2021-05-04 NOTE — Progress Notes (Signed)
Pt took a.m. medications w/o incident.  Pt continues to request to d/c.  Staff to pass on patient concerns to MD

## 2021-05-04 NOTE — Progress Notes (Signed)
Pt is continuly on the phone.  She becomes increasingly agitated the more she speaks or attempts to speak with her children or family.  Pt continues to make delusional statements.

## 2021-05-04 NOTE — BHH Counselor (Signed)
CSW spoke with pt's guardian [name] Long requesting guardianship and consent paperwork for pt.   She stated that she sent guardianship paperwork during pt's last admission and said it should be in the medical record.  CSW requested she resend as it does not appear to be in the record.  She stated she would also send pt's ROI and consent to release information form to CSW when she is able.   CSW informed guardian that pt was improving and would potentially discharge by end of week. CSW was informed that [name] Long is the requested service provider for pt.   CSW will coordinate appointment and aftercare plan  in preparation for discharge.   [name] Long, [name] Long, [name] Long 11/1/20223:43 PM

## 2021-05-04 NOTE — Progress Notes (Signed)
Patient is observed interacting appropriately with others in the dayroom. Pt is medication compliant. Pt is focused on discharging to home. Pt is preoccupied and observed responding to internal stimuli. Pt denies SI/ HI at this time. Pt remains on q 15 minute safety checks.

## 2021-05-04 NOTE — Progress Notes (Signed)
[facility] An MD Progress Note  05/04/2021 6:33 PM [name] Long  MRN:  [id number] Subjective:  Mood stable but complaining of fatigue Principal Problem: Schizoaffective disorder ([place]) Diagnosis: Principal Problem:   Schizoaffective disorder ([place]) Active Problems:   Hypothyroidism   Type 2 diabetes mellitus ([place])   HYPERCHOLESTEROLEMIA   HYPERTENSION, BENIGN SYSTEMIC   Hypokalemia   Atrial fibrillation ([place])  Total Time spent with patient: 30 minutes  Past Psychiatric History: schizoaffective disorder  Past Medical History:  Past Medical History:  Diagnosis Date   Abscessed tooth 02/09/2020   Acute on chronic congestive heart failure ([place])    Adenomatous colon polyp    Arthritis    "real bad; all over" (07/12/2017)   Asthma    Atrial fibrillation ([place])    BIPOLAR DISORDER 08/31/2006   Qualifier: Diagnosis of  By: Dorathy Daft MD, Marjory Lies     CHRONIC KIDNEY DISEASE STAGE II (MILD) 09/14/2009   Annotation: eGFR 87 Qualifier: Diagnosis of  By: Jess Barters MD, Erik     Chronic lower back pain    Congestive heart failure (CHF) (HCC)    COPD (chronic obstructive pulmonary disease) ([place]) 09/23/2010   Diagnosed at [facility] in 2008 (Dr. Annamaria Boots)    DM (diabetes mellitus) type II controlled with renal manifestation ([place]) 08/31/2006   Qualifier: Diagnosis of  By: Dorathy Daft MD, Marjory Lies     Dyspnea    "all my life; since 6th grade" (07/12/2017)   Fatty liver    HYPERCHOLESTEROLEMIA 08/31/2006   Intolerance to Lipitor OK on Crestor but medicaid no longer covering     HYPERTENSION, BENIGN SYSTEMIC 08/31/2006   Qualifier: Diagnosis of  By: Dorathy Daft MD, Aaron     Hypokalemia    HYPOTHYROIDISM, UNSPECIFIED 08/31/2006   Qualifier: Diagnosis of  By: Dorathy Daft MD, Marjory Lies     Leg swelling 03/14/2018   Pneumonia    "3 times" (07/12/2017)   Pulmonary nodule    Renal cyst    Schizophrenia ([place])    Scoliosis    Stomach problems    Thyroid disorder     Past Surgical History:  Procedure Laterality Date   CESAREAN  SECTION     FOOT FRACTURE SURGERY Right    "steel plate in it"   FOOT SURGERY     " born w/dislocated foot"   FRACTURE SURGERY     KNEE ARTHROSCOPY Right    TOE SURGERY Bilateral    "both pinky toes"   TONSILLECTOMY AND ADENOIDECTOMY     Family History:  Family History  Problem Relation Age of Onset   Breast cancer Sister    Heart disease Mother 81   Rectal cancer Mother    Diabetes Father 32   Hypertension Father    Heart disease Brother    Asthma Daughter    Asthma Son    Breast cancer Sister    Asthma Daughter    Colon cancer Maternal Grandmother    Stomach cancer Neg Hx    Esophageal cancer Neg Hx    Pancreatic cancer Neg Hx    Family Psychiatric  History: see previous Social History:  Social History   Substance and Sexual Activity  Alcohol Use Not Currently   Comment: 07/12/2017 "stopped 2 yr ago; did have a drink over the holidays recently"     Social History   Substance and Sexual Activity  Drug Use No    Social History   Socioeconomic History   Marital status: Divorced    Spouse name: Not on file   Number  of children: 4   Years of education: 12   Highest education level: High school graduate  Occupational History   Not on file  Tobacco Use   Smoking status: Former    Packs/day: 0.25    Years: 45.00    Pack years: 11.25    Types: Cigarettes   Smokeless tobacco: Never   Tobacco comments:    patient states she smokes 1 cigarette per month  Vaping Use   Vaping Use: Never used  Substance and Sexual Activity   Alcohol use: Not Currently    Comment: 07/12/2017 "stopped 2 yr ago; did have a drink over the holidays recently"   Drug use: No   Sexual activity: Not Currently  Other Topics Concern   Not on file  Social History Narrative   Patient lives alone in [place].    Patient does not drive, she uses insurance transportation or her children.   Patient enjoys twirling her paton, listening to music, dancing, and spending time with family.     Patient enjoys celebrating all holidays and birthdays of loved ones.   Social Determinants of Health   Financial Resource Strain: Not on file  Food Insecurity: Not on file  Transportation Needs: Not on file  Physical Activity: Not on file  Stress: Not on file  Social Connections: Not on file   Additional Social History:                         Sleep: Fair  Appetite:  Fair  Current Medications: Current Facility-Administered Medications  Medication Dose Route Frequency Provider Last Rate Last Admin   acetaminophen (TYLENOL) tablet 650 mg  650 mg Oral Q4H PRN Marica Trentham T, MD   650 mg at 04/24/21 0214   acetaminophen (TYLENOL) tablet 650 mg  650 mg Oral Q6H PRN Quest Tavenner T, MD   650 mg at 04/30/21 0046   albuterol (VENTOLIN HFA) 108 (90 Base) MCG/ACT inhaler 2 puff  2 puff Inhalation QID PRN Zaylin Runco T, MD   2 puff at 04/27/21 2101   alum & mag hydroxide-simeth (MAALOX/MYLANTA) 200-200-20 MG/5ML suspension 30 mL  30 mL Oral Q4H PRN Helaina Stefano T, MD   30 mL at 05/03/21 1328   amLODipine (NORVASC) tablet 5 mg  5 mg Oral Daily Rylin Seavey, Madie Reno, MD   5 mg at 05/04/21 0932   aspirin EC tablet 81 mg  81 mg Oral Daily Julee Stoll T, MD   81 mg at 05/04/21 0932   divalproex (DEPAKOTE) DR tablet 750 mg  750 mg Oral Q12H Louine Tenpenny T, MD   750 mg at 05/04/21 0932   hydrochlorothiazide (HYDRODIURIL) tablet 25 mg  25 mg Oral Daily Hena Ewalt T, MD   25 mg at 05/04/21 0932   ibuprofen (ADVIL) tablet 800 mg  800 mg Oral Q8H PRN Donovon Micheletti T, MD   800 mg at 05/04/21 1131   levothyroxine (SYNTHROID) tablet 25 mcg  25 mcg Oral QAC breakfast Florrie Ramires T, MD   25 mcg at 05/04/21 0383   magnesium hydroxide (MILK OF MAGNESIA) suspension 30 mL  30 mL Oral Daily PRN Wenceslaus Gist T, MD       metFORMIN (GLUCOPHAGE) tablet 1,000 mg  1,000 mg Oral BID WC Danyah Guastella T, MD   1,000 mg at 05/04/21 1630   methocarbamol (ROBAXIN) tablet 500 mg  500 mg Oral TID PRN Rulon Sera, MD   500 mg at 05/04/21 1520  metoprolol succinate (TOPROL-XL) 24 hr tablet 50 mg  50 mg Oral Daily Kelechi Orgeron T, MD   50 mg at 05/04/21 0931   OLANZapine (ZYPREXA) injection 5 mg  5 mg Intramuscular BID PRN Kindell Strada, Madie Reno, MD   5 mg at 04/20/21 1930   pantoprazole (PROTONIX) EC tablet 40 mg  40 mg Oral Daily Tadan Shill, Madie Reno, MD   40 mg at 05/04/21 0744   potassium chloride (KLOR-CON) CR tablet 10 mEq  10 mEq Oral BID Demetrice Combes, Madie Reno, MD   10 mEq at 05/04/21 0931   risperiDONE (RISPERDAL M-TABS) disintegrating tablet 2 mg  2 mg Oral BID Jonice Cerra, Madie Reno, MD   2 mg at 05/04/21 3710    Lab Results:  Results for orders placed or performed during the hospital encounter of 04/19/21 (from the past 48 hour(s))  Valproic acid level     Status: None   Collection Time: 05/04/21  7:26 AM  Result Value Ref Range   Valproic Acid Lvl 66 50.0 - 100.0 ug/mL    Comment: Performed at [facility], [address], [place], [place] [address]   *Note: Due to a large number of results and/or encounters for the requested time period, some results have not been displayed. A complete set of results can be found in Results Review.    Blood Alcohol level:  Lab Results  Component Value Date   ETH <10 04/17/2021   ETH <10 85/46/2703    Metabolic Disorder Labs: Lab Results  Component Value Date   HGBA1C 8.3 (H) 04/19/2021   MPG 192 04/19/2021   MPG 200 10/01/2020   No results found for: PROLACTIN Lab Results  Component Value Date   CHOL 166 04/19/2021   TRIG 213 (H) 04/19/2021   HDL 43 04/19/2021   CHOLHDL 3.9 04/19/2021   VLDL 43 (H) 04/19/2021   LDLCALC 80 04/19/2021   LDLCALC 180 (H) 10/01/2020    Physical Findings: AIMS:  , ,  ,  ,    CIWA:    COWS:     Musculoskeletal: Strength & Muscle Tone: within normal limits Gait & Station: normal Patient leans: N/A  Psychiatric Specialty Exam:  Presentation  General Appearance: Casual  Eye  Contact:Fair  Speech:Pressured  Speech Volume:Increased  Handedness:Right   Mood and Affect  Mood:Anxious; Irritable  Affect:Congruent   Thought Process  Thought Processes:Goal Directed  Descriptions of Associations:Intact  Orientation:Full (Time, Place and Person)  Thought Content:Paranoid Ideation; Perseveration; Illogical  History of Schizophrenia/Schizoaffective disorder:Yes  Duration of Psychotic Symptoms:Greater than six months  Hallucinations:No data recorded Ideas of Reference:Paranoia; Percusatory  Suicidal Thoughts:No data recorded Homicidal Thoughts:No data recorded  Sensorium  Memory:Immediate Fair; Recent Fair; Remote Poor  Judgment:Impaired  Insight:Shallow   Executive Functions  Concentration:Fair  Attention Span:Fair  [place]   Psychomotor Activity  Psychomotor Activity:No data recorded  Assets  Assets:Desire for Improvement; Financial Resources/Insurance   Sleep  Sleep:No data recorded   Physical Exam: Physical Exam Vitals and nursing note reviewed.  Constitutional:      Appearance: Normal appearance.  HENT:     Head: Normocephalic and atraumatic.     Mouth/Throat:     Pharynx: Oropharynx is clear.  Eyes:     Pupils: Pupils are equal, round, and reactive to light.  Cardiovascular:     Rate and Rhythm: Normal rate and regular rhythm.  Pulmonary:     Effort: Pulmonary effort is normal.     Breath sounds: Normal  breath sounds.  Abdominal:     General: Abdomen is flat.     Palpations: Abdomen is soft.  Musculoskeletal:        General: Normal range of motion.  Skin:    General: Skin is warm and dry.  Neurological:     General: No focal deficit present.     Mental Status: She is alert. Mental status is at baseline.  Psychiatric:        Attention and Perception: Attention normal.        Mood and Affect: Mood normal.        Speech: Speech is tangential.        Behavior:  Behavior is cooperative.        Thought Content: Thought content is paranoid.        Cognition and Memory: Memory is impaired.   Review of Systems  Constitutional: Negative.   HENT: Negative.    Eyes: Negative.   Respiratory: Negative.    Cardiovascular: Negative.   Gastrointestinal: Negative.   Musculoskeletal: Negative.   Skin: Negative.   Neurological: Negative.   Psychiatric/Behavioral: Negative.    Blood pressure 105/75, pulse 77, temperature (!) 97.5 F (36.4 C), temperature source Oral, resp. rate 16, SpO2 100 %. There is no height or weight on file to calculate BMI.   Treatment Plan Summary: Medication management and Plan check depakote level. Invega shot tomorrow. DC risperdal after shot  Alethia Berthold, MD 05/04/2021, 6:33 PM

## 2021-05-05 LAB — VALPROIC ACID LEVEL: Valproic Acid Lvl: 85 ug/mL (ref 50.0–100.0)

## 2021-05-05 MED ORDER — PALIPERIDONE PALMITATE ER 156 MG/ML IM SUSY
156.0000 mg | PREFILLED_SYRINGE | INTRAMUSCULAR | Status: DC
  Administered 2021-05-05: 156 mg via INTRAMUSCULAR
  Filled 2021-05-05: qty 1

## 2021-05-05 NOTE — Progress Notes (Signed)
Pt received IM long term injectable in R deltoid w/o complications. Cont to monitor

## 2021-05-05 NOTE — BHH Counselor (Signed)
[name] Long has failed to provide guardianship paper work to hospital. Accordingly, CSW has reached out to clerk of court to confirm guardianship.  CSW has confirmed with [place] that [name] Long has been appointed as guardian over patient.   Signed:  Durenda Hurt, MSW, LCSWA, LCASA 05/05/2021 9:20 AM

## 2021-05-05 NOTE — BHH Group Notes (Signed)
10:15AM  Group  Pt participated in group coloring

## 2021-05-05 NOTE — Progress Notes (Signed)
Recreation Therapy Notes   Date: 05/05/2021  Time: 1:15pm   Location: Craft room    Behavioral response: Appropriate  Intervention Topic: Goals   Discussion/Intervention:  Group content on today was focused on goals. Patients described what goals are and how they define goals. Individuals expressed how they go about setting goals and reaching them. The group identified how important goals are and if they make short term goals to reach long term goals. Patients described how many goals they work on at a time and what affects them not reaching their goal. Individuals described how much time they put into planning and obtaining their goals. The group participated in the intervention "My Goal Board" and made personal goal boards to help them achieve their goal. Clinical Observations/Feedback: Patient came to group late and was focused on what peers and staff had to say about goals. Individual was social with peers and staff while participating in the intervention.  [name] Long LRT/CTRS         [name] 05/05/2021 3:54 PM

## 2021-05-05 NOTE — BHH Group Notes (Signed)
9:45AM  Pt participated in group Question Ball throw  Pt was very engaged and pleasant

## 2021-05-05 NOTE — ED Notes (Signed)
Pt had Lab to draw Depakote level this a.m. pt tolerated well however pt verbalized paranoid thoughts stated " my sister is the one getting all my blood and mixing saline in with it to keep me here longer"  Pt was reassured by staff that her laboratory samples were secure and would not be shared with anyone.

## 2021-05-05 NOTE — Progress Notes (Signed)
Patient is observed responding to internal stimuli. Pt denies SI, and HI. Pt complained of neck pain radiating downwards (See MAR). Pt is monitored q 15 minutes for safety.

## 2021-05-05 NOTE — Group Note (Signed)
[facility] LCSW Group Therapy Note   Group Date: 05/05/2021 Start Time: 1430 End Time: 1500  Type of Therapy/Topic:  Group Therapy:  Feelings about Diagnosis  Participation Level:  Active      Description of Group:    This group will allow patients to explore their thoughts and feelings about diagnoses they have received. Patients will be guided to explore their level of understanding and acceptance of these diagnoses. Facilitator will encourage patients to process their thoughts and feelings about the reactions of others to their diagnosis, and will guide patients in identifying ways to discuss their diagnosis with significant others in their lives. This group will be process-oriented, with patients participating in exploration of their own experiences as well as giving and receiving support and challenge from other group members.   Therapeutic Goals: 1. Patient will demonstrate understanding of diagnosis as evidence by identifying two or more symptoms of the disorder:  2. Patient will be able to express two feelings regarding the diagnosis 3. Patient will demonstrate ability to communicate their needs through discussion and/or role plays  Summary of Patient Progress:    Patient was present for the entirety of the group session. Patient was an active listener and participated in the topic of discussion. CSW met with patient individually regarding topic of discussion. She stated she was looking forward to going home and taking a long bath. She said she got all of her medications to make her strong so she is ready to go. She requested CSW for food bank resources. She continues to appear to have delusions regarding her stolen identity, mood was pleasant.      Therapeutic Modalities:   Cognitive Behavioral Therapy Brief Therapy Feelings Identification    Treysean Petruzzi A [name], LCSWA

## 2021-05-05 NOTE — Plan of Care (Signed)
°  Problem: Group Participation °Goal: STG - Patient will engage in groups without prompting or encouragement from LRT x3 group sessions within 5 recreation therapy group sessions °Description: STG - Patient will engage in groups without prompting or encouragement from LRT x3 group sessions within 5 recreation therapy group sessions °Outcome: Progressing °  °

## 2021-05-05 NOTE — Progress Notes (Signed)
[facility] MD Progress Note  05/05/2021 4:58 PM [name] Long  MRN:  [id number] Subjective: Patient seen for follow-up.  70 year old woman with schizoaffective disorder.  Patient today was mostly appropriate in conversation.  She was polite and was not cursing me or making grandiose threats.  She was able to fairly reasonably discuss discharge.  When talking at length it is clear that she still has some paranoid ideas about her family but is not talking about acting on them.  Indeed she was agreeable to a plan for follow-up medication management.  Patient got her second Invega injection today and has now been taken off of Risperdal. Principal Problem: Schizoaffective disorder ([place]) Diagnosis: Principal Problem:   Schizoaffective disorder ([place]) Active Problems:   Hypothyroidism   Type 2 diabetes mellitus ([place])   HYPERCHOLESTEROLEMIA   HYPERTENSION, BENIGN SYSTEMIC   Hypokalemia   Atrial fibrillation ([place])  Total Time spent with patient: 30 minutes  Past Psychiatric History: Past history of schizoaffective disorder chronic mental illness multiple hospitalizations  Past Medical History:  Past Medical History:  Diagnosis Date   Abscessed tooth 02/09/2020   Acute on chronic congestive heart failure ([place])    Adenomatous colon polyp    Arthritis    "real bad; all over" (07/12/2017)   Asthma    Atrial fibrillation ([place])    BIPOLAR DISORDER 08/31/2006   Qualifier: Diagnosis of  By: Dorathy Daft MD, Marjory Lies     CHRONIC KIDNEY DISEASE STAGE II (MILD) 09/14/2009   Annotation: eGFR 35 Qualifier: Diagnosis of  By: Jess Barters MD, Erik     Chronic lower back pain    Congestive heart failure (CHF) (HCC)    COPD (chronic obstructive pulmonary disease) ([place]) 09/23/2010   Diagnosed at [facility] in 2008 (Dr. Annamaria Boots)    DM (diabetes mellitus) type II controlled with renal manifestation ([place]) 08/31/2006   Qualifier: Diagnosis of  By: Dorathy Daft MD, Marjory Lies     Dyspnea    "all my life; since 6th grade" (07/12/2017)   Fatty  liver    HYPERCHOLESTEROLEMIA 08/31/2006   Intolerance to Lipitor OK on Crestor but medicaid no longer covering     HYPERTENSION, BENIGN SYSTEMIC 08/31/2006   Qualifier: Diagnosis of  By: Dorathy Daft MD, Aaron     Hypokalemia    HYPOTHYROIDISM, UNSPECIFIED 08/31/2006   Qualifier: Diagnosis of  By: Dorathy Daft MD, Marjory Lies     Leg swelling 03/14/2018   Pneumonia    "3 times" (07/12/2017)   Pulmonary nodule    Renal cyst    Schizophrenia ([place])    Scoliosis    Stomach problems    Thyroid disorder     Past Surgical History:  Procedure Laterality Date   CESAREAN SECTION     FOOT FRACTURE SURGERY Right    "steel plate in it"   FOOT SURGERY     " born w/dislocated foot"   FRACTURE SURGERY     KNEE ARTHROSCOPY Right    TOE SURGERY Bilateral    "both pinky toes"   TONSILLECTOMY AND ADENOIDECTOMY     Family History:  Family History  Problem Relation Age of Onset   Breast cancer Sister    Heart disease Mother 60   Rectal cancer Mother    Diabetes Father 70   Hypertension Father    Heart disease Brother    Asthma Daughter    Asthma Son    Breast cancer Sister    Asthma Daughter    Colon cancer Maternal Grandmother    Stomach cancer Neg Hx  Esophageal cancer Neg Hx    Pancreatic cancer Neg Hx    Family Psychiatric  History: See previous Social History:  Social History   Substance and Sexual Activity  Alcohol Use Not Currently   Comment: 07/12/2017 "stopped 2 yr ago; did have a drink over the holidays recently"     Social History   Substance and Sexual Activity  Drug Use No    Social History   Socioeconomic History   Marital status: Divorced    Spouse name: Not on file   Number of children: 4   Years of education: 12   Highest education level: High school graduate  Occupational History   Not on file  Tobacco Use   Smoking status: Former    Packs/day: 0.25    Years: 45.00    Pack years: 11.25    Types: Cigarettes   Smokeless tobacco: Never   Tobacco comments:     patient states she smokes 1 cigarette per month  Vaping Use   Vaping Use: Never used  Substance and Sexual Activity   Alcohol use: Not Currently    Comment: 07/12/2017 "stopped 2 yr ago; did have a drink over the holidays recently"   Drug use: No   Sexual activity: Not Currently  Other Topics Concern   Not on file  Social History Narrative   Patient lives alone in [place].    Patient does not drive, she uses insurance transportation or her children.   Patient enjoys twirling her paton, listening to music, dancing, and spending time with family.    Patient enjoys celebrating all holidays and birthdays of loved ones.   Social Determinants of Health   Financial Resource Strain: Not on file  Food Insecurity: Not on file  Transportation Needs: Not on file  Physical Activity: Not on file  Stress: Not on file  Social Connections: Not on file   Additional Social History:                         Sleep: Fair  Appetite:  Good  Current Medications: Current Facility-Administered Medications  Medication Dose Route Frequency Provider Last Rate Last Admin   acetaminophen (TYLENOL) tablet 650 mg  650 mg Oral Q4H PRN Evelisse Szalkowski T, MD   650 mg at 04/24/21 0214   acetaminophen (TYLENOL) tablet 650 mg  650 mg Oral Q6H PRN Dominion Kathan T, MD   650 mg at 05/05/21 0149   albuterol (VENTOLIN HFA) 108 (90 Base) MCG/ACT inhaler 2 puff  2 puff Inhalation QID PRN Luisdavid Hamblin T, MD   2 puff at 04/27/21 2101   alum & mag hydroxide-simeth (MAALOX/MYLANTA) 200-200-20 MG/5ML suspension 30 mL  30 mL Oral Q4H PRN Rosalea Withrow T, MD   30 mL at 05/05/21 0413   amLODipine (NORVASC) tablet 5 mg  5 mg Oral Daily Christee Mervine T, MD   5 mg at 05/05/21 1031   aspirin EC tablet 81 mg  81 mg Oral Daily Jalyssa Fleisher T, MD   81 mg at 05/05/21 1031   divalproex (DEPAKOTE) DR tablet 750 mg  750 mg Oral Q12H Park Beck T, MD   750 mg at 05/05/21 1031   hydrochlorothiazide (HYDRODIURIL) tablet 25 mg  25  mg Oral Daily Annalia Metzger T, MD   25 mg at 05/05/21 1031   ibuprofen (ADVIL) tablet 800 mg  800 mg Oral Q8H PRN Tayllor Breitenstein, Madie Reno, MD   800 mg at 05/05/21 1224   levothyroxine (  SYNTHROID) tablet 25 mcg  25 mcg Oral QAC breakfast Cleotis Sparr T, MD   25 mcg at 05/05/21 0932   magnesium hydroxide (MILK OF MAGNESIA) suspension 30 mL  30 mL Oral Daily PRN Matisha Termine T, MD       metFORMIN (GLUCOPHAGE) tablet 1,000 mg  1,000 mg Oral BID WC Synia Douglass T, MD   1,000 mg at 05/05/21 1647   methocarbamol (ROBAXIN) tablet 500 mg  500 mg Oral TID PRN Rulon Sera, MD   500 mg at 05/05/21 0149   metoprolol succinate (TOPROL-XL) 24 hr tablet 50 mg  50 mg Oral Daily Myliyah Rebuck T, MD   50 mg at 05/05/21 1031   OLANZapine (ZYPREXA) injection 5 mg  5 mg Intramuscular BID PRN Timberlyn Pickford T, MD   5 mg at 04/20/21 1930   paliperidone (INVEGA SUSTENNA) injection 156 mg  156 mg Intramuscular Q28 days Ferol Laiche T, MD   156 mg at 05/05/21 1011   pantoprazole (PROTONIX) EC tablet 40 mg  40 mg Oral Daily Lendy Dittrich T, MD   40 mg at 05/05/21 6712   potassium chloride (KLOR-CON) CR tablet 10 mEq  10 mEq Oral BID Nainika Newlun, Madie Reno, MD   10 mEq at 05/05/21 1031    Lab Results:  Results for orders placed or performed during the hospital encounter of 04/19/21 (from the past 48 hour(s))  Valproic acid level     Status: None   Collection Time: 05/04/21  7:26 AM  Result Value Ref Range   Valproic Acid Lvl 66 50.0 - 100.0 ug/mL    Comment: Performed at [facility], [address] [place], [place] [address]  Valproic acid level     Status: None   Collection Time: 05/05/21  7:41 AM  Result Value Ref Range   Valproic Acid Lvl 85 50.0 - 100.0 ug/mL    Comment: Performed at [facility], [address] [place], [place] [address]   *Note: Due to a large number of results and/or encounters for the requested time period, some results have not been displayed. A complete set of results can be found  in Results Review.    Blood Alcohol level:  Lab Results  Component Value Date   ETH <10 04/17/2021   ETH <10 25/11/3974    Metabolic Disorder Labs: Lab Results  Component Value Date   HGBA1C 8.3 (H) 04/19/2021   MPG 192 04/19/2021   MPG 200 10/01/2020   No results found for: PROLACTIN Lab Results  Component Value Date   CHOL 166 04/19/2021   TRIG 213 (H) 04/19/2021   HDL 43 04/19/2021   CHOLHDL 3.9 04/19/2021   VLDL 43 (H) 04/19/2021   LDLCALC 80 04/19/2021   LDLCALC 180 (H) 10/01/2020    Physical Findings: AIMS:  , ,  ,  ,    CIWA:    COWS:     Musculoskeletal: Strength & Muscle Tone: within normal limits Gait & Station: normal Patient leans: N/A  Psychiatric Specialty Exam:  Presentation  General Appearance: Casual  Eye Contact:Fair  Speech:Pressured  Speech Volume:Increased  Handedness:Right   Mood and Affect  Mood:Anxious; Irritable  Affect:Congruent   Thought Process  Thought Processes:Goal Directed  Descriptions of Associations:Intact  Orientation:Full (Time, Place and Person)  Thought Content:Paranoid Ideation; Perseveration; Illogical  History of Schizophrenia/Schizoaffective disorder:Yes  Duration of Psychotic Symptoms:Greater than six months  Hallucinations:No data recorded Ideas of Reference:Paranoia; Percusatory  Suicidal Thoughts:No data recorded Homicidal Thoughts:No data recorded  Sensorium  Memory:Immediate Fair; Recent Fair; Remote Poor  Judgment:Impaired  Insight:Shallow   Executive Functions  Concentration:Fair  Attention Span:Fair  [place]   Psychomotor Activity  Psychomotor Activity:No data recorded  Assets  Assets:Desire for Improvement; Financial Resources/Insurance   Sleep  Sleep:No data recorded   Physical Exam: Physical Exam Vitals and nursing note reviewed.  Constitutional:      Appearance: Normal appearance.  HENT:     Head:  Normocephalic and atraumatic.     Mouth/Throat:     Pharynx: Oropharynx is clear.  Eyes:     Pupils: Pupils are equal, round, and reactive to light.  Cardiovascular:     Rate and Rhythm: Normal rate and regular rhythm.  Pulmonary:     Effort: Pulmonary effort is normal.     Breath sounds: Normal breath sounds.  Abdominal:     General: Abdomen is flat.     Palpations: Abdomen is soft.  Musculoskeletal:        General: Normal range of motion.  Skin:    General: Skin is warm and dry.  Neurological:     General: No focal deficit present.     Mental Status: She is alert. Mental status is at baseline.  Psychiatric:        Attention and Perception: Attention normal.        Mood and Affect: Mood normal.        Speech: Speech is tangential.        Behavior: Behavior is cooperative.        Thought Content: Thought content is paranoid.        Cognition and Memory: Memory is impaired.   Review of Systems  Constitutional: Negative.   HENT: Negative.    Eyes: Negative.   Respiratory: Negative.    Cardiovascular: Negative.   Gastrointestinal: Negative.   Musculoskeletal: Negative.   Skin: Negative.   Neurological: Negative.   Psychiatric/Behavioral: Negative.    Blood pressure 126/82, pulse 77, temperature (!) 97.4 F (36.3 C), temperature source Oral, resp. rate 18, SpO2 100 %. There is no height or weight on file to calculate BMI.   Treatment Plan Summary: Plan patient has gotten much better.  Depakote level normal.  Transitioning now to long-acting injectable medicine.  Plan is for discharge tomorrow.  Treatment team reviewed all of this.  Daughter has requested that the patient follow up with [facility] for outpatient mental health treatment.  Prescriptions will be prepared tomorrow for discharge.  Alethia Berthold, MD 05/05/2021, 4:58 PM

## 2021-05-06 MED ORDER — METHOCARBAMOL 500 MG PO TABS: 500.0000 mg | ORAL_TABLET | Freq: Three times a day (TID) | ORAL | 2 refills | Status: DC | PRN

## 2021-05-06 MED ORDER — DIVALPROEX SODIUM 250 MG PO DR TAB
750.0000 mg | DELAYED_RELEASE_TABLET | Freq: Two times a day (BID) | ORAL | 2 refills | Status: DC
Start: 2021-05-06 — End: 2022-03-07

## 2021-05-06 MED ORDER — ASPIRIN 81 MG PO TBEC: 81.0000 mg | DELAYED_RELEASE_TABLET | Freq: Every day | ORAL | 11 refills | Status: DC

## 2021-05-06 MED ORDER — METOPROLOL SUCCINATE ER 50 MG PO TB24: 50.0000 mg | ORAL_TABLET | Freq: Every day | ORAL | 2 refills | Status: DC

## 2021-05-06 MED ORDER — SITAGLIPTIN PHOSPHATE 50 MG PO TABS: 50.0000 mg | ORAL_TABLET | Freq: Every day | ORAL | 2 refills | Status: DC

## 2021-05-06 MED ORDER — METFORMIN HCL 1000 MG PO TABS: 1000.0000 mg | ORAL_TABLET | Freq: Two times a day (BID) | ORAL | 2 refills | Status: DC

## 2021-05-06 MED ORDER — IBUPROFEN 800 MG PO TABS: 800.0000 mg | ORAL_TABLET | Freq: Three times a day (TID) | ORAL | 0 refills | Status: DC | PRN

## 2021-05-06 MED ORDER — ROSUVASTATIN CALCIUM 40 MG PO TABS: 40.0000 mg | ORAL_TABLET | Freq: Every day | ORAL | 2 refills | Status: DC

## 2021-05-06 MED ORDER — PANTOPRAZOLE SODIUM 40 MG PO TBEC: 40.0000 mg | DELAYED_RELEASE_TABLET | Freq: Every day | ORAL | 2 refills | Status: DC

## 2021-05-06 MED ORDER — PALIPERIDONE PALMITATE ER 156 MG/ML IM SUSY: 156.0000 mg | PREFILLED_SYRINGE | INTRAMUSCULAR | 2 refills | Status: DC

## 2021-05-06 MED ORDER — POTASSIUM CHLORIDE CRYS ER 10 MEQ PO TBCR: 10.0000 meq | EXTENDED_RELEASE_TABLET | Freq: Two times a day (BID) | ORAL | 2 refills | Status: DC

## 2021-05-06 MED ORDER — LEVOTHYROXINE SODIUM 25 MCG PO TABS: 25.0000 ug | ORAL_TABLET | Freq: Every day | ORAL | 2 refills | Status: DC

## 2021-05-06 MED ORDER — HYDROCHLOROTHIAZIDE 25 MG PO TABS: 25.0000 mg | ORAL_TABLET | Freq: Every day | ORAL | 2 refills | Status: DC

## 2021-05-06 MED ORDER — ALBUTEROL SULFATE HFA 108 (90 BASE) MCG/ACT IN AERS: 2.0000 | INHALATION_SPRAY | Freq: Four times a day (QID) | RESPIRATORY_TRACT | 2 refills | Status: DC | PRN

## 2021-05-06 MED ORDER — AMLODIPINE BESYLATE 5 MG PO TABS: 5.0000 mg | ORAL_TABLET | Freq: Every day | ORAL | 2 refills | Status: DC

## 2021-05-06 NOTE — Care Management Important Message (Signed)
Important Message  Patient Details  Name: [name] Long MRN: [id number] Date of Birth: 13-Aug-1950   Medicare Important Message Given:  Yes (IM message provided with verbal consent to pt's guardian, Meiko Ives)     Loras Grieshop A [name], [place] 05/06/2021, 8:35 AM

## 2021-05-06 NOTE — Progress Notes (Signed)
Recreation Therapy Notes  INPATIENT RECREATION TR PLAN  Patient Details Name: [name] Long MRN: [id number] DOB: February 06, 1951 Today's Date: 05/06/2021  Rec Therapy Plan Is patient appropriate for Therapeutic Recreation?: Yes Treatment times per week: at least 3 Estimated Length of Stay: 5-7 days TR Treatment/Interventions: Group participation (Comment)  Discharge Criteria Pt will be discharged from therapy if:: Discharged Treatment plan/goals/alternatives discussed and agreed upon by:: Patient/family  Discharge Summary Short term goals set: Patient will engage in groups without prompting or encouragement from LRT x3 group sessions within 5 recreation therapy group sessions Short term goals met: Complete Progress toward goals comments: Groups attended Which groups?: Goal setting, Social skills, Wellness, Leisure education, AAA/T, Other (Comment) (Self-care, Happiness) Reason goals not met: N/A Therapeutic equipment acquired: N/A Reason patient discharged from therapy: Discharge from hospital Pt/family agrees with progress & goals achieved: Yes Date patient discharged from therapy: 05/06/21   Uday Jantz 05/06/2021, 3:09 PM
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
## 2021-05-06 NOTE — Progress Notes (Addendum)
  [facility] Adult Case Management Discharge Plan :  Will you be returning to the same living situation after discharge:  Yes,  pt will be returning home At discharge, do you have transportation home?: Yes,  pt's family member will be providing transportation Do you have the ability to pay for your medications: Yes,  Medicare Part A and B  Release of information consent forms completed and in the chart;  Patient's signature needed at discharge.  Patient to Follow up at:  Follow-up [facility] Call.   Why: An initial referral has been started. Please fill out "Request First Visit" on [facility] website to make your first appointment. They are accepting new patients. Thanks! Contact information: [place] [address] [place], [place], [place]   [phone] [place] [address]     Next Steps: Follow up Appointment:  Instructions: Here is another referral for psychiatric care/medication management. Appointments not available until January. Please call to schedule an appointmnet if ever needed. Thanks!     Next level of care provider has access to [place] and Suicide Prevention discussed: Yes,  completed with pt's guardian     Has patient been referred to the Quitline?: Patient refused referral  Patient has been referred for addiction treatment: Pt. refused referral  Shanequa Whitenight A [name], [place] 05/06/2021, 9:17 AM

## 2021-05-06 NOTE — BHH Suicide Risk Assessment (Signed)
[facility] Discharge Suicide Risk Assessment   Principal Problem: Schizoaffective disorder [facility]) Discharge Diagnoses: Principal Problem:   Schizoaffective disorder ([place]) Active Problems:   Hypothyroidism   Type 2 diabetes mellitus ([place])   HYPERCHOLESTEROLEMIA   HYPERTENSION, BENIGN SYSTEMIC   Hypokalemia   Atrial fibrillation (HCC)   Total Time spent with patient: 45 minutes  Musculoskeletal: Strength & Muscle Tone: within normal limits Gait & Station: normal Patient leans: N/A  Psychiatric Specialty Exam  Presentation  General Appearance: Casual  Eye Contact:Fair  Speech:Pressured  Speech Volume:Increased  Handedness:Right   Mood and Affect  Mood:Anxious; Irritable  Duration of Depression Symptoms: Greater than two weeks  Affect:Congruent   Thought Process  Thought Processes:Goal Directed  Descriptions of Associations:Intact  Orientation:Full (Time, Place and Person)  Thought Content:Paranoid Ideation; Perseveration; Illogical  History of Schizophrenia/Schizoaffective disorder:Yes  Duration of Psychotic Symptoms:Greater than six months  Hallucinations:No data recorded Ideas of Reference:Paranoia; Percusatory  Suicidal Thoughts:No data recorded Homicidal Thoughts:No data recorded  Sensorium  Memory:Immediate Fair; Recent Fair; Remote Poor  Judgment:Impaired  Insight:Shallow   Executive Functions  Concentration:Fair  Attention Span:Fair  [place]   Psychomotor Activity  Psychomotor Activity:No data recorded  Assets  Assets:Desire for Improvement; Financial Resources/Insurance   Sleep  Sleep:No data recorded  Physical Exam: Physical Exam Vitals and nursing note reviewed.  Constitutional:      Appearance: Normal appearance.  HENT:     Head: Normocephalic and atraumatic.     Mouth/Throat:     Pharynx: Oropharynx is clear.  Eyes:     Pupils: Pupils are equal, round, and reactive to  light.  Cardiovascular:     Rate and Rhythm: Normal rate and regular rhythm.  Pulmonary:     Effort: Pulmonary effort is normal.     Breath sounds: Normal breath sounds.  Abdominal:     General: Abdomen is flat.     Palpations: Abdomen is soft.  Musculoskeletal:        General: Normal range of motion.  Skin:    General: Skin is warm and dry.  Neurological:     General: No focal deficit present.     Mental Status: She is alert. Mental status is at baseline.  Psychiatric:        Mood and Affect: Mood normal.        Thought Content: Thought content normal.   Review of Systems  Constitutional: Negative.   HENT: Negative.    Eyes: Negative.   Respiratory: Negative.    Cardiovascular: Negative.   Gastrointestinal: Negative.   Musculoskeletal: Negative.   Skin: Negative.   Neurological: Negative.   Psychiatric/Behavioral: Negative.    Blood pressure 121/90, pulse 72, temperature 97.9 F (36.6 C), temperature source Oral, resp. rate 20, SpO2 100 %. There is no height or weight on file to calculate BMI.  Mental Status Per Nursing Assessment::   On Admission:  NA  Demographic Factors:  Age 13 or older and Living alone  Loss Factors: Decline in physical health  Historical Factors: NA  Risk Reduction Factors:   Sense of responsibility to family, Positive social support, Positive therapeutic relationship, and Positive coping skills or problem solving skills  Continued Clinical Symptoms:  Bipolar Disorder:   Mixed State Schizophrenia:   Paranoid or undifferentiated type  Cognitive Features That Contribute To Risk:  None    Suicide Risk:  Minimal: No identifiable suicidal ideation.  Patients presenting with no risk factors but with morbid ruminations; may be classified as minimal  risk based on the severity of the depressive symptoms   [facility] Call.   Why: An initial referral has been started. Please fill out "Request First  Visit" on [facility] website to make your first appointment. They are accepting new patients. Thanks! Contact information: [place] [address] [phone]         [facility] Follow up.   Why: Here is another referral for psychiatric care/medication management. Appointments not available until January. Please call to schedule an appointmnet if ever needed. Thanks! Contact information: [address] [place] [address] [phone]                 Plan Of Care/Follow-up recommendations:  Patient will be discharged today with current medications sent as electronic prescriptions to her pharmacy.  She will be instructed to follow-up with [facility] for outpatient treatment.  Reviewed plan with patient who is agreeable.  Patient's affect is upbeat positive and forward looking and has no expression of suicidal thoughts at all.  Alethia Berthold, MD 05/06/2021, 9:40 AM

## 2021-05-06 NOTE — BHH Counselor (Addendum)
CSW contacted Shelda Pal, pt's daughter/guardian.  [phone] Guardian provided verbal permission for CSW to make appointment for follow up care. CSW faxed pt information to the following for initial referral:  [place], [place] [address]  PHONE [phone] Fax [phone]  EMAIL [email or web address]   Satia Winger [name], MSW, [place] 11/3/20221:08 PM

## 2021-05-06 NOTE — Plan of Care (Signed)
  Problem: Group Participation Goal: STG - Patient will engage in groups without prompting or encouragement from LRT x3 group sessions within 5 recreation therapy group sessions Description: STG - Patient will engage in groups without prompting or encouragement from LRT x3 group sessions within 5 recreation therapy group sessions Outcome: Completed/Met

## 2021-05-06 NOTE — BH IP Treatment Plan (Signed)
Interdisciplinary Treatment and Diagnostic Plan Update  05/06/2021 Time of Session: 8:30AM [name] Long MRN: [id number]  Principal Diagnosis: Schizoaffective disorder [facility])  Secondary Diagnoses: Principal Problem:   Schizoaffective disorder ([place]) Active Problems:   Hypothyroidism   Type 2 diabetes mellitus ([place])   HYPERCHOLESTEROLEMIA   HYPERTENSION, BENIGN SYSTEMIC   Hypokalemia   Atrial fibrillation ([place])   Current Medications:  Current Facility-Administered Medications  Medication Dose Route Frequency Provider Last Rate Last Admin   acetaminophen (TYLENOL) tablet 650 mg  650 mg Oral Q4H PRN Clapacs, John T, MD   650 mg at 04/24/21 0214   acetaminophen (TYLENOL) tablet 650 mg  650 mg Oral Q6H PRN Clapacs, John T, MD   650 mg at 05/05/21 0149   albuterol (VENTOLIN HFA) 108 (90 Base) MCG/ACT inhaler 2 puff  2 puff Inhalation QID PRN Clapacs, John T, MD   2 puff at 04/27/21 2101   alum & mag hydroxide-simeth (MAALOX/MYLANTA) 200-200-20 MG/5ML suspension 30 mL  30 mL Oral Q4H PRN Clapacs, John T, MD   30 mL at 05/05/21 0413   amLODipine (NORVASC) tablet 5 mg  5 mg Oral Daily Clapacs, Madie Reno, MD   5 mg at 05/06/21 1740   aspirin EC tablet 81 mg  81 mg Oral Daily Clapacs, Madie Reno, MD   81 mg at 05/06/21 0914   divalproex (DEPAKOTE) DR tablet 750 mg  750 mg Oral Q12H Clapacs, John T, MD   750 mg at 05/06/21 0913   hydrochlorothiazide (HYDRODIURIL) tablet 25 mg  25 mg Oral Daily Clapacs, John T, MD   25 mg at 05/06/21 0914   ibuprofen (ADVIL) tablet 800 mg  800 mg Oral Q8H PRN Clapacs, John T, MD   800 mg at 05/05/21 2300   levothyroxine (SYNTHROID) tablet 25 mcg  25 mcg Oral QAC breakfast Clapacs, John T, MD   25 mcg at 05/06/21 0553   magnesium hydroxide (MILK OF MAGNESIA) suspension 30 mL  30 mL Oral Daily PRN Clapacs, John T, MD       metFORMIN (GLUCOPHAGE) tablet 1,000 mg  1,000 mg Oral BID WC Clapacs, John T, MD   1,000 mg at 05/06/21 0750   methocarbamol (ROBAXIN) tablet 500 mg   500 mg Oral TID PRN Rulon Sera, MD   500 mg at 05/05/21 2039   metoprolol succinate (TOPROL-XL) 24 hr tablet 50 mg  50 mg Oral Daily Clapacs, John T, MD   50 mg at 05/06/21 0914   OLANZapine (ZYPREXA) injection 5 mg  5 mg Intramuscular BID PRN Clapacs, John T, MD   5 mg at 04/20/21 1930   paliperidone (INVEGA SUSTENNA) injection 156 mg  156 mg Intramuscular Q28 days Clapacs, John T, MD   156 mg at 05/05/21 1011   pantoprazole (PROTONIX) EC tablet 40 mg  40 mg Oral Daily Clapacs, John T, MD   40 mg at 05/06/21 0750   potassium chloride (KLOR-CON) CR tablet 10 mEq  10 mEq Oral BID Clapacs, John T, MD   10 mEq at 05/06/21 8144   Current Outpatient Medications  Medication Sig Dispense Refill   albuterol (VENTOLIN HFA) 108 (90 Base) MCG/ACT inhaler Inhale 2 puffs into the lungs 4 (four) times daily as needed for wheezing or shortness of breath. 18 g 2   amLODipine (NORVASC) 5 MG tablet Take 1 tablet (5 mg total) by mouth daily. 30 tablet 2   [START ON 05/07/2021] aspirin EC 81 MG EC tablet Take 1 tablet (81 mg total) by  mouth daily. Swallow whole. 30 tablet 11   Blood Glucose Monitoring Suppl (FREESTYLE LITE) w/Device KIT 1 each by Does not apply route daily. Dx:E11.9 1 kit 0   Blood Pressure Monitoring (BLOOD PRESSURE MONITOR/WRIST) KIT 1 Units by Does not apply route daily at 6 (six) AM. 1 kit 0   divalproex (DEPAKOTE) 250 MG DR tablet Take 3 tablets (750 mg total) by mouth every 12 (twelve) hours. 180 tablet 2   glucose blood (ACCU-CHEK GUIDE) test strip USE TO TEST BLOOD SUGAR ONCE DAILY 100 strip 1   glucose blood (FREESTYLE LITE) test strip Use to test blood sugar one daily. Dx: E11.9 100 each 5   glucose blood (ONETOUCH VERIO) test strip Use to test blood sugar 2-3 times daily. Dx: E11.9 100 each 11   glucose blood test strip One Touch Ultra 2 Test Strips Use to test blood sugar once daily. Dx: E11.9 100 each 5   glucose blood test strip Use 2-3 times daily 100 each 12   [START ON 05/07/2021]  hydrochlorothiazide (HYDRODIURIL) 25 MG tablet Take 1 tablet (25 mg total) by mouth daily. 30 tablet 2   ibuprofen (ADVIL) 800 MG tablet Take 1 tablet (800 mg total) by mouth every 8 (eight) hours as needed for moderate pain. 60 tablet 0   Lancets (FREESTYLE) lancets Use to check blood sugar once daily. Dx: E11.9 100 each 5   [START ON 05/07/2021] levothyroxine (SYNTHROID) 25 MCG tablet Take 1 tablet (25 mcg total) by mouth daily before breakfast. 30 tablet 2   metFORMIN (GLUCOPHAGE) 1000 MG tablet Take 1 tablet (1,000 mg total) by mouth 2 (two) times daily with a meal. 60 tablet 2   methocarbamol (ROBAXIN) 500 MG tablet Take 1 tablet (500 mg total) by mouth 3 (three) times daily as needed for muscle spasms. 60 tablet 2   [START ON 05/07/2021] metoprolol succinate (TOPROL-XL) 50 MG 24 hr tablet Take 1 tablet (50 mg total) by mouth daily. 30 tablet 2   [START ON 06/02/2021] paliperidone (INVEGA SUSTENNA) 156 MG/ML SUSY injection Inject 1 mL (156 mg total) into the muscle every 28 (twenty-eight) days. 1.2 mL 2   [START ON 05/07/2021] pantoprazole (PROTONIX) 40 MG tablet Take 1 tablet (40 mg total) by mouth daily. 30 tablet 2   potassium chloride (KLOR-CON) 10 MEQ tablet Take 1 tablet (10 mEq total) by mouth 2 (two) times daily. 60 tablet 2   rosuvastatin (CRESTOR) 40 MG tablet Take 1 tablet (40 mg total) by mouth daily. 30 tablet 2   sitaGLIPtin (JANUVIA) 50 MG tablet Take 1 tablet (50 mg total) by mouth daily. 30 tablet 2   PTA Medications: No medications prior to admission.    Patient Stressors: Loss of Father and husband 1 year ago    Patient Strengths: Supportive family/friends   Treatment Modalities: Medication Management, Group therapy, Case management,  1 to 1 session with clinician, Psychoeducation, Recreational therapy.   Physician Treatment Plan for Primary Diagnosis: Schizoaffective disorder ([place]) Long Term Goal(s): Improvement in symptoms so as ready for discharge   Short Term  Goals: Compliance with prescribed medications will improve Ability to demonstrate self-control will improve Ability to identify and develop effective coping behaviors will improve  Medication Management: Evaluate patient's response, side effects, and tolerance of medication regimen.  Therapeutic Interventions: 1 to 1 sessions, Unit Group sessions and Medication administration.  Evaluation of Outcomes: Adequate for Discharge  Physician Treatment Plan for Secondary Diagnosis: Principal Problem:   Schizoaffective disorder ([place]) Active Problems:  Hypothyroidism   Type 2 diabetes mellitus ([facility])   HYPERCHOLESTEROLEMIA   HYPERTENSION, BENIGN SYSTEMIC   Hypokalemia   Atrial fibrillation ([place])  Long Term Goal(s): Improvement in symptoms so as ready for discharge   Short Term Goals: Compliance with prescribed medications will improve Ability to demonstrate self-control will improve Ability to identify and develop effective coping behaviors will improve     Medication Management: Evaluate patient's response, side effects, and tolerance of medication regimen.  Therapeutic Interventions: 1 to 1 sessions, Unit Group sessions and Medication administration.  Evaluation of Outcomes: Adequate for Discharge   RN Treatment Plan for Primary Diagnosis: Schizoaffective disorder ([place]) Long Term Goal(s): Knowledge of disease and therapeutic regimen to maintain health will improve  Short Term Goals: Ability to remain free from injury will improve, Ability to verbalize frustration and anger appropriately will improve, Ability to demonstrate self-control, Ability to participate in decision making will improve, Ability to verbalize feelings will improve, Ability to identify and develop effective coping behaviors will improve, and Compliance with prescribed medications will improve  Medication Management: RN will administer medications as ordered by provider, will assess and evaluate patient's response and  provide education to patient for prescribed medication. RN will report any adverse and/or side effects to prescribing provider.  Therapeutic Interventions: 1 on 1 counseling sessions, Psychoeducation, Medication administration, Evaluate responses to treatment, Monitor vital signs and CBGs as ordered, Perform/monitor CIWA, COWS, AIMS and Fall Risk screenings as ordered, Perform wound care treatments as ordered.  Evaluation of Outcomes: Adequate for Discharge   LCSW Treatment Plan for Primary Diagnosis: Schizoaffective disorder [facility]) Long Term Goal(s): Safe transition to appropriate next level of care at discharge, Engage patient in therapeutic group addressing interpersonal concerns.  Short Term Goals: Engage patient in aftercare planning with referrals and resources, Increase social support, Increase ability to appropriately verbalize feelings, Increase emotional regulation, Facilitate acceptance of mental health diagnosis and concerns, and Increase skills for wellness and recovery  Therapeutic Interventions: Assess for all discharge needs, 1 to 1 time with Social worker, Explore available resources and support systems, Assess for adequacy in community support network, Educate family and significant other(s) on suicide prevention, Complete Psychosocial Assessment, Interpersonal group therapy.  Evaluation of Outcomes: Adequate for Discharge   Progress in Treatment: Attending groups: Yes. Participating in groups: Yes. Taking medication as prescribed: Yes. Toleration medication: Yes. Family/Significant other contact made: Yes, individual(s) contacted:  pt's guardian/daughter Patient understands diagnosis: No. Discussing patient identified problems/goals with staff: No. Medical problems stabilized or resolved: Yes. Denies suicidal/homicidal ideation: Yes. Issues/concerns per patient self-inventory: No. Other: None  New problem(s) identified: No, Describe:  None  New Short Term/Long Term  Goal(s): elimination of symptoms of psychosis, medication management for mood stabilization; elimination of SI thoughts; development of comprehensive mental wellness plan. Update 04/26/21: No changes at this time.  Update 05/01/2021: No changes at this time. Update 05/06/2021: No changes at this time.    Patient Goals:  "took the Risperdal shots, now I'm ready to go"  Update 04/26/21: No changes at this time.  Update 05/01/2021: No changes at this time. Update 05/06/2021: No changes at this time.    Discharge Plan or Barriers: CSW will assist pt with development of appropriate discharge/aftercare plan. Update 04/26/21: CSW spoke with pt's guardian Scotlyn Mccranie who stated that pt can return to her home, however pt's lease is up in December and will need a change is housing. CSW suggested guardian reach out to [place] for any  housing alternative referrals. Update 05/01/2021: No changes at this time. Update 05/06/2021: No changes at this time.    Reason for Continuation of Hospitalization: Aggression Delusions  Medication stabilization   Estimated Length of Stay: TBD   Scribe for Treatment Team:  A , LCSWA 05/06/2021 11:01 AM 

## 2021-05-06 NOTE — Progress Notes (Signed)
Patient is alert and cooperative. Denies SI and HI. Compliant with medication. Observed responding to internal stimuli. Ate breakfast in the dayroom among staff and peers with good appetite. Denies pain.  Discharge instruction provided to patient on route and time of medication and patient verbalize understanding. Personal belonging return. Patient discharge in the care of daughter in no apparent distress.

## 2021-05-07 ENCOUNTER — Telehealth: Payer: Self-pay | Admitting: *Deleted

## 2021-05-07 ENCOUNTER — Emergency Department (HOSPITAL_COMMUNITY)
Admission: EM | Admit: 2021-05-07 | Discharge: 2021-05-07 | Disposition: A | Discharge status: Home / Self Care | Payer: Medicare Other | Attending: Emergency Medicine | Admitting: Emergency Medicine

## 2021-05-07 ENCOUNTER — Emergency Department (HOSPITAL_COMMUNITY): Payer: Medicare Other

## 2021-05-07 ENCOUNTER — Encounter (HOSPITAL_COMMUNITY): Payer: Self-pay | Admitting: Emergency Medicine

## 2021-05-07 DIAGNOSIS — R7309 Other abnormal glucose: Secondary | ICD-10-CM | POA: Diagnosis not present

## 2021-05-07 DIAGNOSIS — Z5321 Procedure and treatment not carried out due to patient leaving prior to being seen by health care provider: Secondary | ICD-10-CM | POA: Diagnosis not present

## 2021-05-07 DIAGNOSIS — M542 Cervicalgia: Secondary | ICD-10-CM | POA: Insufficient documentation

## 2021-05-07 DIAGNOSIS — I639 Cerebral infarction, unspecified: Secondary | ICD-10-CM | POA: Insufficient documentation

## 2021-05-07 DIAGNOSIS — R739 Hyperglycemia, unspecified: Secondary | ICD-10-CM | POA: Diagnosis not present

## 2021-05-07 DIAGNOSIS — F29 Unspecified psychosis not due to a substance or known physiological condition: Secondary | ICD-10-CM | POA: Diagnosis not present

## 2021-05-07 DIAGNOSIS — R0789 Other chest pain: Secondary | ICD-10-CM | POA: Diagnosis not present

## 2021-05-07 DIAGNOSIS — I672 Cerebral atherosclerosis: Secondary | ICD-10-CM | POA: Diagnosis not present

## 2021-05-07 DIAGNOSIS — R29818 Other symptoms and signs involving the nervous system: Secondary | ICD-10-CM | POA: Diagnosis not present

## 2021-05-07 DIAGNOSIS — R531 Weakness: Secondary | ICD-10-CM | POA: Diagnosis not present

## 2021-05-07 LAB — CBC WITH DIFFERENTIAL/PLATELET
Abs Immature Granulocytes: 0.01 10*3/uL (ref 0.00–0.07)
Basophils Absolute: 0 10*3/uL (ref 0.0–0.1)
Basophils Relative: 0 %
Eosinophils Absolute: 0.1 10*3/uL (ref 0.0–0.5)
Eosinophils Relative: 2 %
HCT: 29.7 % — ABNORMAL LOW (ref 36.0–46.0)
Hemoglobin: 9.6 g/dL — ABNORMAL LOW (ref 12.0–15.0)
Immature Granulocytes: 0 %
Lymphocytes Relative: 22 %
Lymphs Abs: 1.1 10*3/uL (ref 0.7–4.0)
MCH: 31.1 pg (ref 26.0–34.0)
MCHC: 32.3 g/dL (ref 30.0–36.0)
MCV: 96.1 fL (ref 80.0–100.0)
Monocytes Absolute: 0.8 10*3/uL (ref 0.1–1.0)
Monocytes Relative: 16 %
Neutro Abs: 3.1 10*3/uL (ref 1.7–7.7)
Neutrophils Relative %: 60 %
Platelets: 158 10*3/uL (ref 150–400)
RBC: 3.09 MIL/uL — ABNORMAL LOW (ref 3.87–5.11)
RDW: 15.1 % (ref 11.5–15.5)
WBC: 5.1 10*3/uL (ref 4.0–10.5)
nRBC: 0 % (ref 0.0–0.2)

## 2021-05-07 LAB — COMPREHENSIVE METABOLIC PANEL
ALT: 12 U/L (ref 0–44)
AST: 16 U/L (ref 15–41)
Albumin: 2.8 g/dL — ABNORMAL LOW (ref 3.5–5.0)
Alkaline Phosphatase: 92 U/L (ref 38–126)
Anion gap: 9 (ref 5–15)
BUN: 22 mg/dL (ref 8–23)
CO2: 26 mmol/L (ref 22–32)
Calcium: 8.7 mg/dL — ABNORMAL LOW (ref 8.9–10.3)
Chloride: 92 mmol/L — ABNORMAL LOW (ref 98–111)
Creatinine, Ser: 1.49 mg/dL — ABNORMAL HIGH (ref 0.44–1.00)
GFR, Estimated: 38 mL/min — ABNORMAL LOW (ref >60)
Glucose, Bld: 263 mg/dL — ABNORMAL HIGH (ref 70–99)
Potassium: 3.8 mmol/L (ref 3.5–5.1)
Sodium: 127 mmol/L — ABNORMAL LOW (ref 135–145)
Total Bilirubin: 0.3 mg/dL (ref 0.3–1.2)
Total Protein: 6.4 g/dL — ABNORMAL LOW (ref 6.5–8.1)

## 2021-05-07 LAB — APTT: aPTT: 26 seconds (ref 24–36)

## 2021-05-07 LAB — I-STAT CHEM 8, ED
BUN: 22 mg/dL (ref 8–23)
Calcium, Ion: 1.17 mmol/L (ref 1.15–1.40)
Chloride: 93 mmol/L — ABNORMAL LOW (ref 98–111)
Creatinine, Ser: 1.5 mg/dL — ABNORMAL HIGH (ref 0.44–1.00)
Glucose, Bld: 267 mg/dL — ABNORMAL HIGH (ref 70–99)
HCT: 31 % — ABNORMAL LOW (ref 36.0–46.0)
Hemoglobin: 10.5 g/dL — ABNORMAL LOW (ref 12.0–15.0)
Potassium: 3.8 mmol/L (ref 3.5–5.1)
Sodium: 130 mmol/L — ABNORMAL LOW (ref 135–145)
TCO2: 26 mmol/L (ref 22–32)

## 2021-05-07 LAB — ETHANOL: Alcohol, Ethyl (B): <10 mg/dL (ref <10)

## 2021-05-07 LAB — PROTIME-INR
INR: 0.9 (ref 0.8–1.2)
Prothrombin Time: 11.6 seconds (ref 11.4–15.2)

## 2021-05-07 NOTE — ED Notes (Signed)
Pt did not respond to vital check

## 2021-05-07 NOTE — ED Provider Notes (Signed)
Emergency Medicine Provider Triage Evaluation Note  [name] Long , a 70 y.o. female  was evaluated in triage.  Pt coming in with multiple complaints, including concerns for possible stroke.  She states today she felt like her right side of her face was not moving as well.  Sometimes she reports pain, other times reports weakness.  She also reports left leg numbness.  She also is concerned about a possible kidney infection, and is also reporting chest pain.  Daughter states patient is here because of shortness of breath.  Review of Systems  Positive: R sided face weakness/pain, L leg pain, cp, sob Negative: fever  Physical Exam  There were no vitals taken for this visit. Gen:   Awake, no distress   Resp:  Normal effort  MSK:   Moves extremities without difficulty  Other:  No sensory deficits of the face.  Strength of upper and lower extremities equal bilaterally.  Negative pronator drift.  CN intact  Medical Decision Making  Medically screening exam initiated at 1:49 PM.  Appropriate orders placed.  [name] Long was informed that the remainder of the evaluation will be completed by another provider, this initial triage assessment does not replace that evaluation, and the importance of remaining in the ED until their evaluation is complete.  Labs, ct head, ua/uds   Franchot Heidelberg, PA-C 05/07/21 1407    Daleen Bo, MD 05/08/21 1736

## 2021-05-07 NOTE — ED Triage Notes (Signed)
Pt transported from home by [facility] after requesting stroke evaluation after being yelled at by her sister. BH history, has not taken meds today. HR 100, 138/90, CBG 301. Pt cooperative with EMS.

## 2021-05-07 NOTE — Telephone Encounter (Signed)
Transition Care Management Follow-up Telephone Call Date of discharge and from where: 05/06/2021 Behavioral health How have you been since you were released from the hospital? Better Any questions or concerns? Yes Having Shoulder pain. Wanted to know if Webb Silversmith would prescribe her pain medication.   Items Reviewed: Did the pt receive and understand the discharge instructions provided? Yes  Medications obtained and verified? Yes Tried to obtain. Patient kept speaking of a pain mediation for her shoulder Other? No  Any new allergies since your discharge? No  Dietary orders reviewed? Yes Do you have support at home? Yes   Home Care and Equipment/Supplies: Were home health services ordered? no If so, what is the name of the agency? na  Has the agency set up a time to come to the patient's home? not applicable Were any new equipment or medical supplies ordered?  No What is the name of the medical supply agency? na Were you able to get the supplies/equipment? not applicable Do you have any questions related to the use of the equipment or supplies? No  Functional Questionnaire: (I = Independent and D = Dependent) ADLs: I  Bathing/Dressing- I  Meal Prep- I  Eating- I  Maintaining continence- I  Transferring/Ambulation- I  Managing Meds- I  Follow up appointments reviewed:  PCP Hospital f/u appt confirmed? Yes  Patient did not want to schedule an appointment because she is wanting pain medication for her Shoulder. Stated that she is going to go see her Orthopaedic instead. Stated that she will call us back to schedule an appointment.  [facility] f/u appt confirmed? No   Are transportation arrangements needed? No  If their condition worsens, is the pt aware to call PCP or go to the Emergency Dept.? Yes Was the patient provided with contact information for the PCP's office or ED? Yes Was to pt encouraged to call back with questions or concerns? Yes

## 2021-05-11 ENCOUNTER — Telehealth: Payer: Self-pay | Admitting: Orthopedic Surgery

## 2021-05-11 NOTE — Telephone Encounter (Signed)
I tried to call pt and message states she has a vm box that has not been set up yet. I will hold message and try again later and offer Dr. Jess Barters next available opening.

## 2021-05-11 NOTE — Telephone Encounter (Signed)
Pt called asking if johnson and Wynetta Emery got back to [place] about her gel injs? She states she got cortisone at her appt on 02/16/21 because if it was the gel injs she wouldn't be in this much pain. Pt would also like to know if we would be able to work her in today for cortisone injections, she states her knees are rubbing and it's making it hard for her to walk. Pt would like a CB.   [phone]

## 2021-05-11 NOTE — Telephone Encounter (Signed)
I offered pt Dr. Jess Barters next available on the 14th she stated she would call back to see if she can get transportation first.

## 2021-05-11 NOTE — Telephone Encounter (Signed)
Appt sch with Dr. Sharol Given for 05/17/21

## 2021-05-12 ENCOUNTER — Telehealth: Payer: Self-pay

## 2021-05-12 NOTE — Telephone Encounter (Signed)
Pt called the triage phone  After telling me her name and DOB pt began to tell me that she was checking on her gel injection. That someone else got her shot upstairs and then switched the conversation to being watched and how she feels cameras are set up everywhere watching her. Then she asked if Dr. Sharol Given was in clinic today. I told her he wasn't. She wanted to be worked in for an inj but as per Dr. Gavin Potters team Monday was the first available. Pt was very upset by this and proceeded to cuss me out stating she wanted to get all her records and wont be back to see Dr. Sharol Given then hung up

## 2021-05-12 NOTE — Telephone Encounter (Signed)
Pt came into facility today stating she wanted to cancel her appt for this upcoming Monday. Pt stated there are other people using her ins card and this is not allowing her to get the injs she wants. Pt not longer wants to come to this facility and stated she will see Dr. Sharol Given and Autumn in court/have them arrested. Pt then stated her cab was waiting and left.

## 2021-05-12 NOTE — Telephone Encounter (Signed)
Please see the message below. When I arrived to the front desk to talk to the pt she was leaving and said that she wants to have me arrested. Our patient was walking towards the clinic area and stated "that lady is crazy she was yelling and screaming out here" See the previous  message from this morning taken by triage when pt was denied a work in appt today because Dr. Sharol Given is not here and she will not see anyone but him. Pt was offered the next available appt for her knee pain which was Monday. She was abusive to triage and this is a common occurrence with almost every interaction with the pt. Please advise.

## 2021-05-14 ENCOUNTER — Ambulatory Visit: Payer: Medicare Other | Admitting: Family

## 2021-05-17 ENCOUNTER — Telehealth: Payer: Self-pay | Admitting: Family

## 2021-05-17 ENCOUNTER — Telehealth: Payer: Self-pay | Admitting: Radiology

## 2021-05-17 ENCOUNTER — Telehealth: Payer: Self-pay

## 2021-05-17 ENCOUNTER — Ambulatory Visit: Payer: Medicare Other | Admitting: Orthopedic Surgery

## 2021-05-17 DIAGNOSIS — F250 Schizoaffective disorder, bipolar type: Secondary | ICD-10-CM

## 2021-05-17 MED ORDER — LINAGLIPTIN 5 MG PO TABS: 5.0000 mg | ORAL_TABLET | Freq: Every day | ORAL | 3 refills | Status: DC

## 2021-05-17 NOTE — Telephone Encounter (Signed)
Error

## 2021-05-17 NOTE — Telephone Encounter (Signed)
Patient called and said her [name] Long medication will no longer be covered starting in May she will need another medication that she can take in place of that one. She also stated that she needs more medicine for her Thyroid she said she is out and she needs more.Marland KitchenMarland KitchenMarland Kitchen

## 2021-05-17 NOTE — Telephone Encounter (Addendum)
Patient notified of the Change and agreed.   Patient asked about her medication Kirt Boys injection and wanted to make sure no one tampered with it.   The medication was handed to me on Friday by Hollie Beach had taken medication from patient when she walked into office stating that she had an appointment. Patient did not have an appointment but one was made on 11/11 but [name] called out so the appointment was canceled.   [name] stated that she was not going to give the injection in office, she is to go to the behavorial Dr. For them to get the injection.   I called patient on Friday and told her that we would not be able to give the injection and that it would need to be picked up. She was going to have her daughter pick it up but called back and stated that she was unable to. Medication placed in locked drug closet with a note stating patient to pick back up. I also called and spoke with Daughter [name].   Patient asked about it again today and I told her it was locked up but she would need to pick it up and she agreed and stated that she would make arrangements.

## 2021-05-17 NOTE — Telephone Encounter (Addendum)
Incoming call received form patients daughter stating that she has called and left several messages today about someone (whoever is covering for [employer]) administering patients Invega Injection which is a psych medication that patient needs (located in our refrigerator).  I informed Sherrita that I was unable to locate a telephone encounter documenting where she had spoken or left a message about this medication and in my professional experience of 17 years I have not known for primary care to administer a psych injection to a patient, as we are not trained or have knowledge about this medication, yet I will forward message to Sherrie Mustache, NP to advise.  I asked Sherrita if patient was referred to a psych provider once medication was initiated by the emergency room physician and Sherrita stated she was referred and refused referral at the time as she does not like going to providers she does not now. Sherrita would like it if referral could be reinstated   Sherrita stated she would like for someone to call [name] (her sister and patients daughter) with Jessica's response.  After call was completed I spoke with Evie another medical assistant to inquire about how medication ended up in the [facility] refrigerator and to her knowledge patient bought it up here last Thursday.   Please advise and route response to Clinical Intake

## 2021-05-17 NOTE — Telephone Encounter (Signed)
Spoke with [name] Long and she stated that she is aware that we cannot give the injection and that she needs to pick up the medication but she stated that she has to work 8-5 and unable to get it until Friday.  Stated that she is off this Friday and will pick it up then.   I informed her that a referral was placed for psych and she agreed.

## 2021-05-17 NOTE — Telephone Encounter (Signed)
Tradjenta 5 mg by mouth daily sent to pharmacy in place of januvia

## 2021-05-17 NOTE — Telephone Encounter (Signed)
I have spoke with patient and Cassandra regarding the medication and told both that it would need to be picked up on Friday after the medication was given to me and I was informed that Webb Silversmith would not administer the medication in our office by her CMA and that she would need to get it from the behavioral Dr.  Loni Muse late entry was placed today after speaking with patient regarding the medication again. She wanted to be sure it was not tampered with and to let us know that she has not forgotten about it.    Both patient and [name] are aware that we cannot give this medication in office and they were made aware on Friday and to the patient again today.  Medication was locked up and note placed on it that patient would be picking up.    I have not received any messages from the daughter Chrystine Oiler today but I have spoke with patient several times.

## 2021-05-17 NOTE — Telephone Encounter (Signed)
We do not administer these injections in office. Psych referral place

## 2021-05-17 NOTE — Telephone Encounter (Signed)
Patient called and stated that Insurance will not cover her Januvia and she cannot afford it.   Wants to know if this can be switched to something different.   Please Advise. (Forwarded to [place] due to [employer] out of office)

## 2021-05-17 NOTE — Addendum Note (Signed)
Addended by: Lauree Chandler on: 05/17/2021 02:29 PM   Modules accepted: Orders

## 2021-05-18 ENCOUNTER — Ambulatory Visit: Payer: Medicare Other | Admitting: Family

## 2021-05-18 ENCOUNTER — Ambulatory Visit (INDEPENDENT_AMBULATORY_CARE_PROVIDER_SITE_OTHER): Payer: Medicare Other | Admitting: Family

## 2021-05-18 ENCOUNTER — Other Ambulatory Visit: Payer: Self-pay

## 2021-05-18 DIAGNOSIS — M25562 Pain in left knee: Secondary | ICD-10-CM | POA: Diagnosis not present

## 2021-05-18 DIAGNOSIS — M1712 Unilateral primary osteoarthritis, left knee: Secondary | ICD-10-CM

## 2021-05-18 DIAGNOSIS — G8929 Other chronic pain: Secondary | ICD-10-CM

## 2021-05-18 DIAGNOSIS — M25512 Pain in left shoulder: Secondary | ICD-10-CM | POA: Diagnosis not present

## 2021-05-18 DIAGNOSIS — M1711 Unilateral primary osteoarthritis, right knee: Secondary | ICD-10-CM

## 2021-05-19 DIAGNOSIS — M1712 Unilateral primary osteoarthritis, left knee: Secondary | ICD-10-CM

## 2021-05-19 DIAGNOSIS — G8929 Other chronic pain: Secondary | ICD-10-CM | POA: Diagnosis not present

## 2021-05-19 DIAGNOSIS — M1711 Unilateral primary osteoarthritis, right knee: Secondary | ICD-10-CM | POA: Diagnosis not present

## 2021-05-19 DIAGNOSIS — M25512 Pain in left shoulder: Secondary | ICD-10-CM

## 2021-05-19 MED ORDER — LIDOCAINE HCL 1 % IJ SOLN
5.0000 mL | INTRAMUSCULAR | Status: AC | PRN
Start: 2021-05-19 — End: 2021-05-19
  Administered 2021-05-19: 5 mL

## 2021-05-19 MED ORDER — METHYLPREDNISOLONE ACETATE 40 MG/ML IJ SUSP
40.0000 mg | INTRAMUSCULAR | Status: AC | PRN
  Administered 2021-05-19: 40 mg via INTRA_ARTICULAR

## 2021-05-19 MED ORDER — LIDOCAINE HCL 1 % IJ SOLN
5.0000 mL | INTRAMUSCULAR | Status: AC | PRN
  Administered 2021-05-19: 5 mL

## 2021-05-19 NOTE — Progress Notes (Signed)
Office Visit Note   Patient: [name] Long           Date of Birth: 11/08/1950           MRN: [id number] Visit Date: 05/18/2021              Requested by: Sandrea Hughs, NP [address] [place],  [place] [address] PCP: Sandrea Hughs, NP  Chief Complaint  Patient presents with  . Left Shoulder - Pain  . Left Knee - Pain      HPI: The patient is a 70 year old woman who presents today with concern for severe left shoulder pain this is a chronic issue for her has been going on for many years she is also seen for evaluation of bilateral knee pain.  Has a history of bilateral osteoarthritis  She is requesting Depo-Medrol injection of her left shoulder as well as gel injections of her knees.  It was discussed with the patient that she is not due for supplemental injection of her knees until this coming February  She was recently hospitalized at [facility] and is requesting anything we can do to help her with her knees to get by  She has been having difficulty doing her activities of daily living due to her multijoint pain.  Left shoulder painful with range of motion actively.  Impingement symptoms have returned.  Knees pain with weightbearing pain with crepitation.  There is no erythema no warmth no swelling associated with this start up pain.  Assessment & Plan: Visit Diagnoses:  1. Chronic left shoulder pain   2. Unilateral primary osteoarthritis, right knee   3. Unilateral primary osteoarthritis, left knee     Plan: Depo-Medrol injection of her left shoulder bilateral knees patient tolerated well she will follow-up in February for supplemental injection of her knees.  Follow-Up Instructions: Return if symptoms worsen or fail to improve.   Right Knee Exam   Muscle Strength  The patient has normal right knee strength.  Tenderness  The patient is experiencing tenderness in the medial joint line.  Range of Motion  The patient has normal right knee  ROM.  Other  Erythema: absent Swelling: none   Left Knee Exam   Muscle Strength  The patient has normal left knee strength.  Tenderness  The patient is experiencing tenderness in the medial joint line.  Range of Motion  The patient has normal left knee ROM.  Other  Erythema: absent Swelling: none   Left Shoulder Exam   Tenderness  Left shoulder tenderness location: diffusely.  Muscle Strength  The patient has normal left shoulder strength.  Tests  Impingement: positive Drop arm: negative  Other  Sensation: normal      Patient is alert, oriented, no adenopathy, well-dressed, normal affect, normal respiratory effort.   Imaging: No results found. No images are attached to the encounter.  Labs: Lab Results  Component Value Date   HGBA1C 8.3 (H) 04/19/2021   HGBA1C 8.6 (H) 10/01/2020   HGBA1C 8.1 (H) 05/26/2020   LABORGA NO GROWTH 12/04/2014     Lab Results  Component Value Date   ALBUMIN 2.8 (L) 05/07/2021   ALBUMIN 3.4 (L) 04/17/2021   ALBUMIN 3.1 (L) 09/21/2020    Lab Results  Component Value Date   MG 1.7 09/04/2020   MG 1.7 12/25/2016   MG 1.8 06/10/2016   Lab Results  Component Value Date   VD25OH 43.0 02/23/2018   VD25OH 30 05/06/2015   VD25OH 12 (  L) 12/18/2014    No results found for: PREALBUMIN CBC EXTENDED Latest Ref Rng & Units 05/07/2021 05/07/2021 04/17/2021  WBC 4.0 - 10.5 K/uL - 5.1 6.3  RBC 3.87 - 5.11 MIL/uL - 3.09(L) 3.53(L)  HGB 12.0 - 15.0 g/dL 10.5(L) 9.6(L) 11.2(L)  HCT 36.0 - 46.0 % 31.0(L) 29.7(L) 34.2(L)  PLT 150 - 400 K/uL - 158 249  NEUTROABS 1.7 - 7.7 K/uL - 3.1 2.4  LYMPHSABS 0.7 - 4.0 K/uL - 1.1 2.7     There is no height or weight on file to calculate BMI.  Orders:  No orders of the defined types were placed in this encounter.  No orders of the defined types were placed in this encounter.    Procedures: Large Joint Inj: bilateral knee on 05/19/2021 9:24 AM Indications: pain Details: 18 G 1.5  in needle, anteromedial approach Medications (Right): 5 mL lidocaine 1 %; 40 mg methylPREDNISolone acetate 40 MG/ML Medications (Left): 5 mL lidocaine 1 %; 40 mg methylPREDNISolone acetate 40 MG/ML Consent was given by the patient.    Large Joint Inj: L subacromial bursa on 05/19/2021 9:24 AM Indications: pain Details: 22 G 1.5 in needle Medications: 5 mL lidocaine 1 %; 40 mg methylPREDNISolone acetate 40 MG/ML Consent was given by the patient.     Clinical Data: No additional findings.  ROS:  All other systems negative, except as noted in the HPI. Review of Systems  Constitutional:  Negative for chills and fever.  Musculoskeletal:  Positive for arthralgias, gait problem and myalgias.   Objective: Vital Signs: There were no vitals taken for this visit.  Specialty Comments:  No specialty comments available.  PMFS History: Patient Active Problem List   Diagnosis Date Noted  . Bipolar I disorder, most recent episode depressed, severe w psychosis ([place])   . Dementia associated with other underlying disease with behavioral disturbance 03/27/2020  . GERD (gastroesophageal reflux disease) 12/27/2019  . Post-menopausal atrophic vaginitis 12/13/2019  . Vagina, candidiasis 12/10/2019  . Vaginal itching 11/10/2019  . Dysphagia 07/02/2019  . Screening examination for STD (sexually transmitted disease) 03/25/2019  . 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 Other chronic pain 12/03/2018  . Dizziness of unknown cause 08/04/2018  . Tremor 07/15/2018  . Atrial flutter with rapid ventricular response ([place]) 07/10/2017  . Atrial fibrillation ([place]) 07/05/2017  . COPD (chronic obstructive pulmonary disease) ([place]) 01/22/2017  . Pedal edema   . Chronic bilateral low back pain without sciatica 08/15/2016  . 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 Schizoaffective disorder ([place])   . Hypokalemia 01/22/2015  . Osteoarthritis, multiple sites  06/08/2011  . Dysuria 02/14/2011  . CHRONIC KIDNEY DISEASE STAGE II (MILD) 09/14/2009  . Hypothyroidism 08/31/2006  . Type 2 diabetes mellitus ([place]) 08/31/2006  . HYPERCHOLESTEROLEMIA 08/31/2006  . Tobacco abuse 08/31/2006  . HYPERTENSION, BENIGN SYSTEMIC 08/31/2006   Past Medical History:  Diagnosis Date  . Abscessed tooth 02/09/2020  . Acute on chronic congestive heart failure ([place])   . Adenomatous colon polyp   . Arthritis    "real bad; all over" (07/12/2017)  . Asthma   . Atrial fibrillation ([place])   . BIPOLAR DISORDER 08/31/2006   Qualifier: Diagnosis of  By: Dorathy Daft MD, Marjory Lies    . CHRONIC KIDNEY DISEASE STAGE II (MILD) 09/14/2009   Annotation: eGFR 64 Qualifier: Diagnosis of  By: Jess Barters MD, [name]    . Chronic lower back pain   . Congestive heart failure (CHF) ([place])   . COPD (chronic  obstructive pulmonary disease) ([place]) 09/23/2010   Diagnosed at [facility] in 2008 (Dr. Annamaria Boots)   . DM (diabetes mellitus) type II controlled with renal manifestation ([place]) 08/31/2006   Qualifier: Diagnosis of  By: Dorathy Daft MD, Marjory Lies    . Dyspnea    "all my life; since 6th grade" (07/12/2017)  . Fatty liver   . HYPERCHOLESTEROLEMIA 08/31/2006   Intolerance to Lipitor OK on Crestor but medicaid no longer covering    . HYPERTENSION, BENIGN SYSTEMIC 08/31/2006   Qualifier: Diagnosis of  By: Dorathy Daft MD, Marjory Lies    . Hypokalemia   . HYPOTHYROIDISM, UNSPECIFIED 08/31/2006   Qualifier: Diagnosis of  By: Dorathy Daft MD, Marjory Lies    . Leg swelling 03/14/2018  . Pneumonia    "3 times" (07/12/2017)  . Pulmonary nodule   . Renal cyst   . Schizophrenia ([place])   . Scoliosis   . Stomach problems   . Thyroid disorder     Family History  Problem Relation Age of Onset  . Breast cancer Sister   . Heart disease Mother 78  . Rectal cancer Mother   . Diabetes Father 75  . Hypertension Father   . Heart disease Brother   . Asthma Daughter   . Asthma Son   . Breast cancer Sister   . Asthma Daughter   . Colon cancer  Maternal Grandmother   . Stomach cancer Neg Hx   . Esophageal cancer Neg Hx   . Pancreatic cancer Neg Hx     Past Surgical History:  Procedure Laterality Date  . CESAREAN SECTION    . FOOT FRACTURE SURGERY Right    "steel plate in it"  . FOOT SURGERY     " born w/dislocated foot"  . FRACTURE SURGERY    . KNEE ARTHROSCOPY Right   . TOE SURGERY Bilateral    "both pinky toes"  . TONSILLECTOMY AND ADENOIDECTOMY     Social History   Occupational History  . Not on file  Tobacco Use  . Smoking status: Former    Packs/day: 0.25    Years: 45.00    Pack years: 11.25    Types: Cigarettes  . Smokeless tobacco: Never  . Tobacco comments:    patient states she smokes 1 cigarette per month  Vaping Use  . Vaping Use: Never used  Substance and Sexual Activity  . Alcohol use: Not Currently    Comment: 07/12/2017 "stopped 2 yr ago; did have a drink over the holidays recently"  . Drug use: No  . Sexual activity: Not Currently

## 2021-05-20 ENCOUNTER — Telehealth: Payer: Self-pay | Admitting: Orthopedic Surgery

## 2021-05-20 NOTE — Telephone Encounter (Signed)
Front desk will call to offer appt with Dr. Sharol Given next available date. Not able to reimburse cab fair

## 2021-05-20 NOTE — Telephone Encounter (Signed)
Pt called requesting a call back for appt. Please call pt at [phone].

## 2021-05-20 NOTE — Telephone Encounter (Signed)
FYI pt states she will be asking Dr. Sharol Given to reimbursement her for her cab fare monies spent for her appts here.

## 2021-05-20 NOTE — Telephone Encounter (Signed)
Can you please call pt and make appt with Dr. Sharol Given only at his next available appt please.

## 2021-05-21 ENCOUNTER — Other Ambulatory Visit: Payer: Self-pay | Admitting: Orthopedic Surgery

## 2021-05-22 ENCOUNTER — Other Ambulatory Visit: Payer: Self-pay | Admitting: Surgical

## 2021-05-22 MED ORDER — IBUPROFEN 800 MG PO TABS
800.0000 mg | ORAL_TABLET | Freq: Three times a day (TID) | ORAL | 0 refills | Status: DC | PRN
Start: 2021-05-22 — End: 2021-06-02

## 2021-05-22 MED ORDER — METHOCARBAMOL 500 MG PO TABS: 500.0000 mg | ORAL_TABLET | Freq: Three times a day (TID) | ORAL | 2 refills | Status: DC | PRN

## 2021-05-25 ENCOUNTER — Telehealth: Payer: Self-pay | Admitting: Orthopedic Surgery

## 2021-05-25 ENCOUNTER — Telehealth: Payer: Self-pay

## 2021-05-25 NOTE — Telephone Encounter (Signed)
Ms. Buccellato came into the office today stating that she had an appointment, that Dr. Sharol Given himself called her and told her to come in.  The medical assistant verified with Dr. Sharol Given that this call did not occur.  When she called in last week she was given an appointment to come in on Tuesday 06/01/21.  The front desk advised Ms. Lindenbaum that her appointment was next week.  She sat down in the waiting room and said that she was going to be seen because she had multiple injuries that Dr. Sharol Given needed to see.  After speaking with Ms. Eads privately (along with the other manager, Laurann Montana) it was determined that she was requesting an injection in her knee which can be done at her scheduled appointment for 06/01/21. Once we advised her that she would not be worked in to the schedule today, she asked to "sign the form to never come back here".  Determined this was a release for medial records that the patient requested be mailed to her home address.

## 2021-05-25 NOTE — Telephone Encounter (Signed)
Patient walked into the office today stating that she had an appt with Dr. Sharol Given ( she is not scheduled until next Tuesday) advised the front desk that we can not work the pt in today and we can see her at her appt on Tuesday. I walked to the lobby to call a pt and the is pt was waving me over to her. I asked if I could help and she states that Dr. Sharol Given called her Saturday night and advised her to come in the office for an appt today. That she has had a concussion and a fractured clavicle and that she has a bent rod in her ankle and needs a brace and that she also needs an injection in her shoulder. I advised the pt that we could not see her today but happy to see her Tuesday and she states that I need to make this right that Dr. Sharol Given told her to come in and that she needs to be seen. I advised front desk and clinic manager and they advised that they will speak with the pt.

## 2021-05-26 ENCOUNTER — Telehealth: Payer: Self-pay | Admitting: Orthopedic Surgery

## 2021-05-26 NOTE — Telephone Encounter (Signed)
Patient called wanting Dr. Sharol Given to know that she was kicked out of the office the day she came and was sitting in the lobby. Patient asked if Dr. Sharol Given would call her when he get a chance. Patient said she was upset about the way she was mistreated. The number to contact patient is [phone]

## 2021-05-26 NOTE — Telephone Encounter (Signed)
Called pt back no answer. Let her know I would send Dr. Sharol Given her concerns but he will not be in office until Monday. FYI.

## 2021-05-26 NOTE — Telephone Encounter (Signed)
Dr. Sharol Given is out of the office today. Next time in office will  be Monday 05/31/21. She has an appt with him in office on 06/01/21

## 2021-05-31 ENCOUNTER — Other Ambulatory Visit: Payer: Self-pay

## 2021-05-31 ENCOUNTER — Ambulatory Visit
Admission: RE | Admit: 2021-05-31 | Discharge: 2021-05-31 | Disposition: A | Discharge status: Home / Self Care | Payer: Medicare Other | Source: Ambulatory Visit | Attending: Family | Admitting: Family

## 2021-05-31 ENCOUNTER — Telehealth: Payer: Self-pay | Admitting: Orthopedic Surgery

## 2021-05-31 DIAGNOSIS — Z1231 Encounter for screening mammogram for malignant neoplasm of breast: Secondary | ICD-10-CM | POA: Diagnosis not present

## 2021-05-31 NOTE — Telephone Encounter (Signed)
Pt called wanting to see if she could be worked in for 06/03/21 so she can have transportation bring her? Pt also stated that she's going to want to talk to Dr.Duda about her being kicked out of the office. Pt states she was so embarrassed she almost didn't want to come back to the office ever again. Pt also wanted to say thank you to Autumn for still trying to get her seen when the other two ladies was throwing her out.   [phone]

## 2021-06-01 ENCOUNTER — Ambulatory Visit: Payer: Medicare Other | Admitting: Orthopedic Surgery

## 2021-06-01 ENCOUNTER — Other Ambulatory Visit: Payer: Self-pay | Admitting: Family

## 2021-06-01 DIAGNOSIS — F17290 Nicotine dependence, other tobacco product, uncomplicated: Secondary | ICD-10-CM | POA: Diagnosis not present

## 2021-06-01 DIAGNOSIS — K219 Gastro-esophageal reflux disease without esophagitis: Secondary | ICD-10-CM

## 2021-06-01 DIAGNOSIS — I2510 Atherosclerotic heart disease of native coronary artery without angina pectoris: Secondary | ICD-10-CM | POA: Diagnosis not present

## 2021-06-01 DIAGNOSIS — I10 Essential (primary) hypertension: Secondary | ICD-10-CM | POA: Diagnosis not present

## 2021-06-01 DIAGNOSIS — E785 Hyperlipidemia, unspecified: Secondary | ICD-10-CM | POA: Diagnosis not present

## 2021-06-01 NOTE — Telephone Encounter (Signed)
Done. Thank you.

## 2021-06-01 NOTE — Telephone Encounter (Signed)
OK so she just called me back and transportation needs 4 days now so she would like to come 06/07/21 @ 2

## 2021-06-01 NOTE — Telephone Encounter (Signed)
Can I get a 2 pm slot opened please?

## 2021-06-01 NOTE — Telephone Encounter (Signed)
Ok this is open

## 2021-06-01 NOTE — Progress Notes (Signed)
There are no findings suspicious for breast malignanc. Recommended to repeat mammogram in 1 year.

## 2021-06-01 NOTE — Telephone Encounter (Signed)
Can you call pt and find out what time she wants to come on 06/03/21 and let me know and I will open a time. Thanks!!

## 2021-06-02 ENCOUNTER — Other Ambulatory Visit: Payer: Self-pay

## 2021-06-02 ENCOUNTER — Telehealth: Payer: Self-pay | Admitting: Orthopedic Surgery

## 2021-06-02 ENCOUNTER — Other Ambulatory Visit: Payer: Self-pay | Admitting: Adult Health

## 2021-06-02 MED ORDER — IBUPROFEN 800 MG PO TABS: 800.0000 mg | ORAL_TABLET | Freq: Three times a day (TID) | ORAL | 0 refills | Status: DC | PRN

## 2021-06-02 NOTE — Telephone Encounter (Addendum)
Called patient. She will call pharmacy to get the refill on the Pantoprazole.   Stated that she will wait till she see's [name] on 12/6 for any Medication changes. So she is holding off on the Hydrocodone for now.   Patient stated that she is feeling much better.   Patient is requesting a refill on her Ibuprofen instead for now.   Please Advise. (Forwarded to [place] due to Bell out of office)

## 2021-06-02 NOTE — Addendum Note (Signed)
Addended by: Rafael Bihari A on: 06/02/2021 12:37 PM   Modules accepted: Orders

## 2021-06-02 NOTE — Telephone Encounter (Signed)
Incoming call received from patient stating she needs [name] Long to reinstate a rx for hydrocodone, HCTZ, and Sucralfate (liquid formulation). Patient states she needs all of the listed medications as someone stopped them because they are trying to mess with her health.  Please advise

## 2021-06-02 NOTE — Telephone Encounter (Signed)
I called pt to advise that we can not give narcotic pain medication but she can discuss her s/s with Dr. Sharol Given and come up with a treatment plan that work for her. Pt states that she has had rx pain medication in the past and that she needs to sleep and that she is taking lots of Ibuprofen and that she needs something to help her go to sleep. I advised again that she can discuss with Dr. Sharol Given at her appt Monday. Pt said to cx her appt that she will not be back and that she will never call us again. I will leave her appt scheduled in the event that pt changes her mind.

## 2021-06-02 NOTE — Telephone Encounter (Signed)
She still has  refill on her HCTZ. Hydrocodone has been discontinued since 11/19/20 Risperidone is not on her list of medications. Sucralfate has been discontinued since 12/20/20

## 2021-06-02 NOTE — Telephone Encounter (Signed)
Pt called stating her ibuprofen 800 mg rx isn't helping with her pain and it's causing her to have to take multiple tablets and then she still loses sleep. Pt would like to know if she can get hydrocodone 5-325 mg called in instead that way she can take just one tablet and it'll ease her pain and help her sleep. Pt would like a CB with an update please.   [phone]

## 2021-06-02 NOTE — Telephone Encounter (Signed)
Called back to state that the MD at hospital put her on panozole in place of sulcrafalte   In place of hydrocodone, she wants  Rispidone   Pencillin is not working, not getting infection out.  Please advise Lattie Haw

## 2021-06-07 ENCOUNTER — Encounter: Payer: Self-pay | Admitting: Orthopedic Surgery

## 2021-06-07 ENCOUNTER — Ambulatory Visit (INDEPENDENT_AMBULATORY_CARE_PROVIDER_SITE_OTHER): Payer: Medicare Other | Admitting: Orthopedic Surgery

## 2021-06-07 DIAGNOSIS — M25512 Pain in left shoulder: Secondary | ICD-10-CM | POA: Diagnosis not present

## 2021-06-07 DIAGNOSIS — M170 Bilateral primary osteoarthritis of knee: Secondary | ICD-10-CM

## 2021-06-07 DIAGNOSIS — G8929 Other chronic pain: Secondary | ICD-10-CM

## 2021-06-07 MED ORDER — IBUPROFEN 800 MG PO TABS: 800.0000 mg | ORAL_TABLET | Freq: Three times a day (TID) | ORAL | 0 refills | Status: DC | PRN

## 2021-06-07 NOTE — Progress Notes (Signed)
Office Visit Note   Patient: [name] Long           Date of Birth: [birth year]           MRN: [id number] Visit Date: 06/07/2021              Requested by: Sandrea Hughs, NP [address] [place],  [place] [address] PCP: Sandrea Hughs, NP  Chief Complaint  Patient presents with   Right Knee - Pain, Follow-up   Left Knee - Pain, Follow-up   Left Shoulder - Pain, Follow-up      HPI: Patient is a 70 year old woman who presents with multiple medical complaints.  Patient states she still has knee pain she is status post hyaluronic acid injections in August.  She states that her left shoulder is actually feeling better.  Patient states that she is having difficulty sleeping she cannot focus she feels like her eyes are wandering and she feels like she has fluid on the brain.  Assessment & Plan: Visit Diagnoses:  1. Chronic left shoulder pain   2. Bilateral primary osteoarthritis of knee     Plan: Recommended that she go to the emergency room for evaluation of the feeling of fluid on her brain.  Discussed the importance of not taking the ibuprofen any sooner than every 8 hours discussed risk of cardiac and kidney damage from taking the ibuprofen every hour.  We will refill her prescription.  Discussed that we can apply for a new hyaluronic acid injection in February.  Follow-Up Instructions: Return if symptoms worsen or fail to improve.   Ortho Exam  Patient is alert, oriented, no adenopathy, well-dressed, normal affect, normal respiratory effort. Examination patient ambulates with a cane.  She has no new symptoms in either knee.  Her shoulder is feeling better.  She has no acute findings of her knees.  Her shoulder is asymptomatic.Primarily complains of feeling like there is fluid on her brain and her eyes not being able to focus.  Patient does not have an antalgic or wide-based gait.  Imaging: No results found. No images are attached to the encounter.  Labs: Lab Results   Component Value Date   HGBA1C 8.3 (H) 04/19/2021   HGBA1C 8.6 (H) 10/01/2020   HGBA1C 8.1 (H) 05/26/2020   LABORGA NO GROWTH 12/04/2014     Lab Results  Component Value Date   ALBUMIN 2.8 (L) 05/07/2021   ALBUMIN 3.4 (L) 04/17/2021   ALBUMIN 3.1 (L) 09/21/2020    Lab Results  Component Value Date   MG 1.7 09/04/2020   MG 1.7 12/25/2016   MG 1.8 06/10/2016   Lab Results  Component Value Date   VD25OH 43.0 02/23/2018   VD25OH 30 05/06/2015   VD25OH 12 (L) 12/18/2014    No results found for: PREALBUMIN CBC EXTENDED Latest Ref Rng & Units 05/07/2021 05/07/2021 04/17/2021  WBC 4.0 - 10.5 K/uL - 5.1 6.3  RBC 3.87 - 5.11 MIL/uL - 3.09(L) 3.53(L)  HGB 12.0 - 15.0 g/dL 10.5(L) 9.6(L) 11.2(L)  HCT 36.0 - 46.0 % 31.0(L) 29.7(L) 34.2(L)  PLT 150 - 400 K/uL - 158 249  NEUTROABS 1.7 - 7.7 K/uL - 3.1 2.4  LYMPHSABS 0.7 - 4.0 K/uL - 1.1 2.7     There is no height or weight on file to calculate BMI.  Orders:  No orders of the defined types were placed in this encounter.  Meds ordered this encounter  Medications   ibuprofen (ADVIL) 800 MG tablet  Sig: Take 1 tablet (800 mg total) by mouth every 8 (eight) hours as needed for moderate pain.    Dispense:  60 tablet    Refill:  0     Procedures: No procedures performed  Clinical Data: No additional findings.  ROS:  All other systems negative, except as noted in the HPI. Review of Systems  Objective: Vital Signs: There were no vitals taken for this visit.  Specialty Comments:  No specialty comments available.  PMFS History: Patient Active Problem List   Diagnosis Date Noted   Bipolar I disorder, most recent episode depressed, severe w psychosis ([place])    Dementia associated with other underlying disease with behavioral disturbance 03/27/2020   GERD (gastroesophageal reflux disease) 12/27/2019   Post-menopausal atrophic vaginitis 12/13/2019   Vagina, candidiasis 12/10/2019   Vaginal itching 11/10/2019    Dysphagia 07/02/2019   Screening examination for STD (sexually transmitted disease) 03/25/2019   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   Other chronic pain 12/03/2018   Dizziness of unknown cause 08/04/2018   Tremor 07/15/2018   Atrial flutter with rapid ventricular response ([place]) 07/10/2017   Atrial fibrillation ([place]) 07/05/2017   COPD (chronic obstructive pulmonary disease) ([place]) 01/22/2017   Pedal edema    Chronic bilateral low back pain without sciatica 08/15/2016   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 08/15/2016   Schizoaffective disorder ([place])    Hypokalemia 01/22/2015   Osteoarthritis, multiple sites 06/08/2011   Dysuria 02/14/2011   CHRONIC KIDNEY DISEASE STAGE II (MILD) 09/14/2009   Hypothyroidism 08/31/2006   Type 2 diabetes mellitus ([place]) 08/31/2006   HYPERCHOLESTEROLEMIA 08/31/2006   Tobacco abuse 08/31/2006   HYPERTENSION, BENIGN SYSTEMIC 08/31/2006   Past Medical History:  Diagnosis Date   Abscessed tooth 02/09/2020   Acute on chronic congestive heart failure ([place])    Adenomatous colon polyp    Arthritis    "real bad; all over" (07/12/2017)   Asthma    Atrial fibrillation ([place])    BIPOLAR DISORDER 08/31/2006   Qualifier: Diagnosis of  By: Dorathy Daft MD, Marjory Lies     CHRONIC KIDNEY DISEASE STAGE II (MILD) 09/14/2009   Annotation: eGFR 29 Qualifier: Diagnosis of  By: Jess Barters MD, Erik     Chronic lower back pain    Congestive heart failure (CHF) ([place])    COPD (chronic obstructive pulmonary disease) ([place]) 09/23/2010   Diagnosed at [facility] in 2008 (Dr. Annamaria Boots)    DM (diabetes mellitus) type II controlled with renal manifestation ([place]) 08/31/2006   Qualifier: Diagnosis of  By: Dorathy Daft MD, Marjory Lies     Dyspnea    "all my life; since 6th grade" (07/12/2017)   Fatty liver    HYPERCHOLESTEROLEMIA 08/31/2006   Intolerance to Lipitor OK on Crestor but medicaid no longer covering     HYPERTENSION, BENIGN SYSTEMIC 08/31/2006   Qualifier: Diagnosis of   By: Dorathy Daft MD, [place]     Hypokalemia    HYPOTHYROIDISM, UNSPECIFIED 08/31/2006   Qualifier: Diagnosis of  By: Dorathy Daft MD, Marjory Lies     Leg swelling 03/14/2018   Pneumonia    "3 times" (07/12/2017)   Pulmonary nodule    Renal cyst    Schizophrenia ([place])    Scoliosis    Stomach problems    Thyroid disorder     Family History  Problem Relation Age of Onset   Breast cancer Sister    Heart disease Mother 9   Rectal cancer Mother    Diabetes Father 94   Hypertension Father  Heart disease Brother    Asthma Daughter    Asthma Son    Breast cancer Sister    Asthma Daughter    Colon cancer Maternal Grandmother    Stomach cancer Neg Hx    Esophageal cancer Neg Hx    Pancreatic cancer Neg Hx     Past Surgical History:  Procedure Laterality Date   CESAREAN SECTION     FOOT FRACTURE SURGERY Right    "steel plate in it"   FOOT SURGERY     " born w/dislocated foot"   FRACTURE SURGERY     KNEE ARTHROSCOPY Right    TOE SURGERY Bilateral    "both pinky toes"   TONSILLECTOMY AND ADENOIDECTOMY     Social History   Occupational History   Not on file  Tobacco Use   Smoking status: Former    Packs/day: 0.25    Years: 45.00    Pack years: 11.25    Types: Cigarettes   Smokeless tobacco: Never   Tobacco comments:    patient states she smokes 1 cigarette per month  Vaping Use   Vaping Use: Never used  Substance and Sexual Activity   Alcohol use: Not Currently    Comment: 07/12/2017 "stopped 2 yr ago; did have a drink over the holidays recently"   Drug use: No   Sexual activity: Not Currently

## 2021-06-08 ENCOUNTER — Ambulatory Visit
Admission: RE | Admit: 2021-06-08 | Discharge: 2021-06-08 | Disposition: A | Discharge status: Home / Self Care | Payer: Medicare Other | Source: Ambulatory Visit | Attending: Family | Admitting: Family

## 2021-06-08 ENCOUNTER — Encounter: Payer: Self-pay | Admitting: Family

## 2021-06-08 ENCOUNTER — Other Ambulatory Visit: Payer: Self-pay

## 2021-06-08 ENCOUNTER — Ambulatory Visit (INDEPENDENT_AMBULATORY_CARE_PROVIDER_SITE_OTHER): Payer: Medicare Other | Admitting: Family

## 2021-06-08 VITALS — BP 124/70 | HR 77 | Temp 97.1°F | Resp 16 | Ht 65.0 in | Wt 169.6 lb

## 2021-06-08 DIAGNOSIS — R0602 Shortness of breath: Secondary | ICD-10-CM

## 2021-06-08 DIAGNOSIS — E1142 Type 2 diabetes mellitus with diabetic polyneuropathy: Secondary | ICD-10-CM

## 2021-06-08 DIAGNOSIS — I480 Paroxysmal atrial fibrillation: Secondary | ICD-10-CM

## 2021-06-08 DIAGNOSIS — R059 Cough, unspecified: Secondary | ICD-10-CM | POA: Diagnosis not present

## 2021-06-08 DIAGNOSIS — M159 Polyosteoarthritis, unspecified: Secondary | ICD-10-CM | POA: Diagnosis not present

## 2021-06-08 DIAGNOSIS — F17290 Nicotine dependence, other tobacco product, uncomplicated: Secondary | ICD-10-CM | POA: Diagnosis not present

## 2021-06-08 DIAGNOSIS — J449 Chronic obstructive pulmonary disease, unspecified: Secondary | ICD-10-CM

## 2021-06-08 DIAGNOSIS — F02818 Dementia in other diseases classified elsewhere, unspecified severity, with other behavioral disturbance: Secondary | ICD-10-CM | POA: Diagnosis not present

## 2021-06-08 DIAGNOSIS — E785 Hyperlipidemia, unspecified: Secondary | ICD-10-CM | POA: Diagnosis not present

## 2021-06-08 DIAGNOSIS — F250 Schizoaffective disorder, bipolar type: Secondary | ICD-10-CM | POA: Diagnosis not present

## 2021-06-08 DIAGNOSIS — R2681 Unsteadiness on feet: Secondary | ICD-10-CM | POA: Diagnosis not present

## 2021-06-08 DIAGNOSIS — I2510 Atherosclerotic heart disease of native coronary artery without angina pectoris: Secondary | ICD-10-CM | POA: Diagnosis not present

## 2021-06-08 DIAGNOSIS — I10 Essential (primary) hypertension: Secondary | ICD-10-CM | POA: Diagnosis not present

## 2021-06-08 MED ORDER — ONETOUCH VERIO VI STRP: ORAL_STRIP | 11 refills | Status: DC

## 2021-06-08 MED ORDER — ALBUTEROL SULFATE HFA 108 (90 BASE) MCG/ACT IN AERS: 2.0000 | INHALATION_SPRAY | Freq: Four times a day (QID) | RESPIRATORY_TRACT | 0 refills | Status: DC | PRN

## 2021-06-08 NOTE — Patient Instructions (Signed)
-   Please get chest X-ray at [facility] at [address] then will call you with results. ? ?

## 2021-06-08 NOTE — Progress Notes (Signed)
Provider: Marlowe Sax FNP-C   Kasyn Stouffer, Nelda Bucks, NP  Patient Care Team: Chaselyn Nanney, Nelda Bucks, NP as PCP - General (Family Medicine) Newt Minion, MD as Consulting Physician (Orthopedic Surgery) Dixie Dials, MD as Consulting Physician (Cardiology)  Extended Emergency Contact Information Primary Emergency Contact: Shelda Pal Address: [address]          [place], [place] [address] [name] of [place] Phone: [phone] Mobile Phone: [phone] Relation: Daughter Secondary Emergency Contact: [place] of [place] Phone: [phone] Relation: Daughter  Code Status:  Full Code  Goals of care: Advanced Directive information Advanced Directives 06/08/2021  Does Patient Have a Medical Advance Directive? Yes  Type of Advance Directive -  Does patient want to make changes to medical advance directive? No - Guardian declined  Copy of [place] in Chart? -  Would patient like information on creating a medical advance directive? -     Chief Complaint  Patient presents with   Medical Management of Chronic Issues    5 month follow up.   Health Maintenance    Discuss the need for Urine Microalbumin, and Foot exam.   Immunizations    Discuss the need for Covid Booster, Influenza vaccine, Tetanus vaccine, and Shingrix vaccine.    HPI:  Pt is a 70 y.o. 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  She was admitted at [facility] 04/19/2021 - 05/06/2021 for worsening manic symptoms agitation  and threats of violence at home.she was started on Invega 156 mg injection monthly but daughter states she threw away medication into the thrush.she was advised to follow up with outpatient Psychiatry daughter [name] states has not scheduled appointment for patient  but will be calling to make appointment.  Patient continues to be paranoid states daughter and Others as doing some " Voodo" on her and trying to take her money and clothes.  No aggressive behaviors reported. Argue with her daughter during visit over her condition.tells daughter that she is the one that has depression and anxiety.Told daughter to get out of the exam room but provider was able to calm her down.  Daughter states patient need a personal care assistant to help patient with her daily activities of living due to patient's mental condition,lower back pain and shortness of breath limiting her ability to do her own ADL's.       Past Medical History:  Diagnosis Date   Abscessed tooth 02/09/2020   Acute on chronic congestive heart failure (HCC)    Adenomatous colon polyp    Arthritis    "real bad; all over" (07/12/2017)   Asthma    Atrial fibrillation ([place])    BIPOLAR DISORDER 08/31/2006   Qualifier: Diagnosis of  By: Dorathy Daft MD, Marjory Lies     CHRONIC KIDNEY DISEASE STAGE II (MILD) 09/14/2009   Annotation: eGFR 14 Qualifier: Diagnosis of  By: Jess Barters MD, [name]     Chronic lower back pain    Congestive heart failure (CHF) (HCC)    COPD (chronic obstructive pulmonary disease) ([place]) 09/23/2010   Diagnosed at [facility] in 2008 (Dr. Annamaria Boots)    DM (diabetes mellitus) type II controlled with renal manifestation ([place]) 08/31/2006   Qualifier: Diagnosis  of  By: Dorathy Daft MD, Marjory Lies     Dyspnea    "all my life; since 6th grade" (07/12/2017)   Fatty liver    HYPERCHOLESTEROLEMIA 08/31/2006   Intolerance to Lipitor OK on Crestor but medicaid no longer covering     HYPERTENSION, BENIGN SYSTEMIC 08/31/2006   Qualifier: Diagnosis of  By:  Dorathy Daft MD, Marjory Lies     Hypokalemia    HYPOTHYROIDISM, UNSPECIFIED 08/31/2006   Qualifier: Diagnosis of  By: Dorathy Daft MD, Marjory Lies     Leg swelling 03/14/2018   Pneumonia    "3 times" (07/12/2017)   Pulmonary nodule    Renal cyst    Schizophrenia ([place])    Scoliosis    Stomach problems    Thyroid disorder    Past Surgical History:  Procedure Laterality Date   CESAREAN SECTION     FOOT FRACTURE SURGERY Right    "steel plate in it"   FOOT SURGERY     " born w/dislocated foot"   FRACTURE SURGERY     KNEE ARTHROSCOPY Right    TOE SURGERY Bilateral    "both pinky toes"   TONSILLECTOMY AND ADENOIDECTOMY      Allergies  Allergen Reactions   Citrus Anaphylaxis and Itching   Fish Allergy Anaphylaxis    Cod   Shellfish Allergy Shortness Of Breath and Other (See Comments)    "Affects thyroid" also   Adhesive [Tape] Other (See Comments)    Must have paper tape only   Atorvastatin Other (See Comments)   Ibuprofen Swelling    Face swells   Latex Dermatitis   Lipitor [Atorvastatin Calcium] Other (See Comments)    Body aches   Lisinopril Other (See Comments)    PER DR. Melvyn Novas (not recalled by patient)   Other Nausea Only and Other (See Comments)    Collards (gas, too)   Codeine Rash   Tramadol Palpitations    Allergies as of 06/08/2021       Reactions   Citrus Anaphylaxis, Itching   Fish Allergy Anaphylaxis   Cod   Shellfish Allergy Shortness Of Breath, Other (See Comments)   "Affects thyroid" also   Adhesive [tape] Other (See Comments)   Must have paper tape only   Atorvastatin Other (See Comments)   Ibuprofen Swelling   Face swells   Latex Dermatitis   Lipitor [atorvastatin Calcium] Other (See Comments)   Body aches   Lisinopril Other (See Comments)   PER DR. Melvyn Novas (not recalled by patient)   Other Nausea Only, Other (See Comments)   Collards (gas, too)   Codeine Rash   Tramadol Palpitations        Medication List        Accurate as of June 08, 2021 11:59  PM. If you have any questions, ask your nurse or doctor.          STOP taking these medications    Farxiga 5 MG Tabs tablet Generic drug: dapagliflozin propanediol Stopped by: Sandrea Hughs, NP   paliperidone 156 MG/ML Susy injection Commonly known as: INVEGA SUSTENNA Stopped by: Sandrea Hughs, NP       TAKE these medications    albuterol 108 (90 Base) MCG/ACT inhaler Commonly known as: VENTOLIN HFA Inhale 2 puffs into the lungs 4 (four) times daily as needed for wheezing or shortness of breath.   amLODipine 5 MG tablet Commonly known as: NORVASC Take 1 tablet (5 mg total) by mouth daily.   aspirin 81 MG EC tablet Take 1 tablet (81  mg total) by mouth daily. Swallow whole.   Blood Pressure Monitor/Wrist Kit 1 Units by Does not apply route daily at 6 (six) AM.   divalproex 250 MG DR tablet Commonly known as: DEPAKOTE Take 3 tablets (750 mg total) by mouth every 12 (twelve) hours.   freestyle lancets Use to check blood sugar once daily. Dx: E11.9   FreeStyle Lite w/Device Kit 1 each by Does not apply route daily. Dx:E11.9   glucose blood test strip One Touch Ultra 2 Test Strips Use to test blood sugar once daily. Dx: E11.9   FREESTYLE LITE test strip Generic drug: glucose blood Use to test blood sugar one daily. Dx: E11.9   Accu-Chek Guide test strip Generic drug: glucose blood USE TO TEST BLOOD SUGAR ONCE DAILY   glucose blood test strip Use 2-3 times daily   OneTouch Verio test strip Generic drug: glucose blood Use to test blood sugar 2-3 times daily. Dx: E11.9   hydrochlorothiazide 25 MG tablet Commonly known as: HYDRODIURIL Take 1 tablet (25 mg total) by mouth daily.   ibuprofen 800 MG tablet Commonly known as: ADVIL Take 1 tablet (800 mg total) by mouth every 8 (eight) hours as needed for moderate pain.   levothyroxine 25 MCG tablet Commonly known as: SYNTHROID Take 1 tablet (25 mcg total) by mouth daily before breakfast.   linagliptin  5 MG Tabs tablet Commonly known as: Tradjenta Take 1 tablet (5 mg total) by mouth daily.   metFORMIN 1000 MG tablet Commonly known as: Glucophage Take 1 tablet (1,000 mg total) by mouth 2 (two) times daily with a meal.   methocarbamol 500 MG tablet Commonly known as: ROBAXIN Take 1 tablet (500 mg total) by mouth 3 (three) times daily as needed for muscle spasms.   metoprolol succinate 50 MG 24 hr tablet Commonly known as: TOPROL-XL Take 1 tablet (50 mg total) by mouth daily.   nitroGLYCERIN 0.4 MG SL tablet Commonly known as: NITROSTAT Place under the tongue as needed.   pantoprazole 40 MG tablet Commonly known as: PROTONIX Take 1 tablet (40 mg total) by mouth daily.   potassium chloride 10 MEQ tablet Commonly known as: KLOR-CON M Take 1 tablet (10 mEq total) by mouth 2 (two) times daily.   rosuvastatin 40 MG tablet Commonly known as: CRESTOR Take 1 tablet (40 mg total) by mouth daily.        Review of Systems  Constitutional:  Negative for appetite change, chills, fatigue, fever and unexpected weight change.  HENT:  Negative for congestion, dental problem, ear discharge, ear pain, facial swelling, hearing loss, nosebleeds, postnasal drip, rhinorrhea, sinus pressure, sinus pain, sneezing, sore throat, tinnitus and trouble swallowing.   Eyes:  Negative for pain, discharge, redness, itching and visual disturbance.  Respiratory:  Negative for chest tightness and wheezing.        COPD  Cardiovascular:  Negative for chest pain, palpitations and leg swelling.  Gastrointestinal:  Negative for abdominal distention, abdominal pain, blood in stool, constipation, diarrhea, nausea and vomiting.  Endocrine: Negative for cold intolerance, heat intolerance, polydipsia, polyphagia and polyuria.  Genitourinary:  Negative for difficulty urinating, dysuria, flank pain, frequency and urgency.  Musculoskeletal:  Positive for arthralgias, back pain and gait problem. Negative for joint  swelling, myalgias, neck pain and neck stiffness.  Skin:  Negative for color change, pallor, rash and wound.  Neurological:  Negative for dizziness, syncope, speech difficulty, weakness, light-headedness, numbness and headaches.  Hematological:  Does not bruise/bleed easily.  Psychiatric/Behavioral:  Positive  for agitation and behavioral problems. Negative for confusion, hallucinations, self-injury, sleep disturbance and suicidal ideas. The patient is not nervous/anxious.    Immunization History  Administered Date(s) Administered   Influenza Split 05/17/2011, 04/27/2012   Influenza Whole 07/11/2007, 06/30/2008, 06/02/2009, 04/22/2010   Influenza,inj,Quad PF,6+ Mos 04/16/2013, 05/02/2014, 04/26/2017, 02/23/2018, 04/11/2019   PFIZER(Purple Top)SARS-COV-2 Vaccination 10/25/2019, 11/19/2019   Pneumococcal Conjugate-13 04/26/2017   Pneumococcal Polysaccharide-23 07/11/2007, 06/08/2011, 03/12/2020   Td 08/04/1998   Tdap 06/08/2011   Pertinent  Health Maintenance Due  Topic Date Due   FOOT EXAM  04/26/2018   INFLUENZA VACCINE  02/01/2021   URINE MICROALBUMIN  03/23/2021   LIPID PANEL  10/18/2021   HEMOGLOBIN A1C  10/18/2021   OPHTHALMOLOGY EXAM  10/28/2021   COLONOSCOPY (Pts 45-46yr Insurance coverage will need to be confirmed)  11/05/2022   MAMMOGRAM  06/01/2023   DEXA SCAN  Completed   Fall Risk 05/04/2021 05/05/2021 05/05/2021 05/06/2021 06/08/2021  Falls in the past year? - - - - 1  Was there an injury with Fall? - - - - 0  Fall Risk Category Calculator - - - - 1  Fall Risk Category - - - - Low  Patient Fall Risk Level Low fall risk Low fall risk Low fall risk Low fall risk Low fall risk  Patient at Risk for Falls Due to - - - - No Fall Risks  Fall risk Follow up - - - - Falls evaluation completed   Functional Status Survey:    Vitals:   06/08/21 1454  BP: 124/70  Pulse: 77  Resp: 16  Temp: (!) 97.1 F (36.2 C)  SpO2: 96%  Weight: 169 lb 9.6 oz (76.9 kg)  Height: '5\' 5"'   (1.651 m)   Body mass index is 28.22 kg/m. Physical Exam Vitals reviewed.  Constitutional:      General: She is not in acute distress.    Appearance: Normal appearance. She is overweight. She is not ill-appearing or diaphoretic.  HENT:     Head: Normocephalic.     Right Ear: Tympanic membrane, ear canal and external ear normal. There is no impacted cerumen.     Left Ear: Tympanic membrane, ear canal and external ear normal. There is no impacted cerumen.     Nose: Nose normal. No congestion or rhinorrhea.     Mouth/Throat:     Mouth: Mucous membranes are moist.     Pharynx: Oropharynx is clear. No oropharyngeal exudate or posterior oropharyngeal erythema.  Eyes:     General: No scleral icterus.       Right eye: No discharge.        Left eye: No discharge.     Extraocular Movements: Extraocular movements intact.     Conjunctiva/sclera: Conjunctivae normal.     Pupils: Pupils are equal, round, and reactive to light.  Neck:     Vascular: No carotid bruit.  Cardiovascular:     Rate and Rhythm: Normal rate and regular rhythm.     Pulses: Normal pulses.     Heart sounds: Normal heart sounds. No murmur heard.   No friction rub. No gallop.  Pulmonary:     Effort: Pulmonary effort is normal. No respiratory distress.     Breath sounds: Normal breath sounds. No wheezing, rhonchi or rales.  Chest:     Chest wall: No tenderness.  Abdominal:     General: Bowel sounds are normal. There is no distension.     Palpations: Abdomen is soft. There is no mass.  Tenderness: There is no abdominal tenderness. There is no right CVA tenderness, left CVA tenderness, guarding or rebound.  Musculoskeletal:        General: No swelling or tenderness. Normal range of motion.     Cervical back: Normal range of motion. No rigidity or tenderness.     Right lower leg: No edema.     Left lower leg: No edema.  Lymphadenopathy:     Cervical: No cervical adenopathy.  Skin:    General: Skin is warm and dry.      Coloration: Skin is not pale.     Findings: No bruising, erythema, lesion or rash.  Neurological:     Mental Status: She is alert and oriented to person, place, and time.     Cranial Nerves: No cranial nerve deficit.     Sensory: No sensory deficit.     Motor: No weakness.     Coordination: Coordination normal.     Gait: Gait normal.  Psychiatric:        Mood and Affect: Mood is elated.        Behavior: Behavior normal.        Thought Content: Thought content is paranoid.        Judgment: Judgment normal.     Comments: Speech loud     Labs reviewed: Recent Labs    09/04/20 1245 09/21/20 1917 04/22/21 1026 05/01/21 0545 05/07/21 1415 05/07/21 1426  NA 132*   < > 133* 130* 127* 130*  K 3.0*   < > 3.5 4.2 3.8 3.8  CL 97*   < > 98 93* 92* 93*  CO2 26   < > '26 29 26  ' --   GLUCOSE 118*   < > 173* 114* 263* 267*  BUN 18   < > 27* '21 22 22  ' CREATININE 1.27*   < > 1.14* 1.23* 1.49* 1.50*  CALCIUM 9.5   < > 9.7 8.9 8.7*  --   MG 1.7  --   --   --   --   --    < > = values in this interval not displayed.   Recent Labs    09/21/20 1917 10/01/20 1147 04/17/21 1938 05/07/21 1415  AST '15 15 19 16  ' ALT '16 12 16 12  ' ALKPHOS 82  --  94 92  BILITOT 0.4 0.3 0.5 0.3  PROT 7.1 7.2 7.6 6.4*  ALBUMIN 3.1*  --  3.4* 2.8*   Recent Labs    01/22/21 1531 04/17/21 1938 05/07/21 1415 05/07/21 1426  WBC 9.1 6.3 5.1  --   NEUTROABS 6,361 2.4 3.1  --   HGB 12.2 11.2* 9.6* 10.5*  HCT 36.8 34.2* 29.7* 31.0*  MCV 92.5 96.9 96.1  --   PLT 263 249 158  --    Lab Results  Component Value Date   TSH 1.615 04/19/2021   Lab Results  Component Value Date   HGBA1C 8.3 (H) 04/19/2021   Lab Results  Component Value Date   CHOL 166 04/19/2021   HDL 43 04/19/2021   LDLCALC 80 04/19/2021   LDLDIRECT 105 (H) 08/14/2012   TRIG 213 (H) 04/19/2021   CHOLHDL 3.9 04/19/2021    Significant Diagnostic Results in last 30 days:  DG Chest 2 View  Result Date: 06/09/2021 CLINICAL DATA:   70 year old female with cough and shortness of breath. EXAM: CHEST - 2 VIEW COMPARISON:  12/24/2018 FINDINGS: The mediastinal contours are within normal limits. No cardiomegaly. Similar appearing trace cicatricial  atelectasis in the right middle lobe. The lungs are clear bilaterally without evidence of focal consolidation, pleural effusion, or pneumothorax. No acute osseous abnormality. Similar appearing sigmoid curvature of the thoracolumbar spine. IMPRESSION: No acute cardiopulmonary process. Electronically Signed   By: Ruthann Cancer M.D.   On: 06/09/2021 09:54   MM 3D SCREEN BREAST BILATERAL  Result Date: 06/01/2021 CLINICAL DATA:  Screening. EXAM: DIGITAL SCREENING BILATERAL MAMMOGRAM WITH TOMOSYNTHESIS AND CAD TECHNIQUE: Bilateral screening digital craniocaudal and mediolateral oblique mammograms were obtained. Bilateral screening digital breast tomosynthesis was performed. The images were evaluated with computer-aided detection. COMPARISON:  Previous exam(s). ACR Breast Density Category b: There are scattered areas of fibroglandular density. FINDINGS: There are no findings suspicious for malignancy. IMPRESSION: No mammographic evidence of malignancy. A result letter of this screening mammogram will be mailed directly to the patient. RECOMMENDATION: Screening mammogram in one year. (Code:[id number]-B-01Y) BI-RADS CATEGORY  1: Negative. Electronically Signed   By: Fidela Salisbury M.D.   On: 06/01/2021 08:58    Assessment/Plan  1. DM type 2 with diabetic peripheral neuropathy (HCC) No CBG for evaluation  Request refill for strips  - continue current meds - glucose blood (ONETOUCH VERIO) test strip; Use to test blood sugar 2-3 times daily. Dx: E11.9  Dispense: 100 each; Refill: 11  2. Chronic obstructive pulmonary disease, unspecified COPD type ([place]) Shortness of breath reported  Will obtain CXR to rule out other abnormalities  - albuterol (VENTOLIN HFA) 108 (90 Base) MCG/ACT inhaler; Inhale 2 puffs  into the lungs 4 (four) times daily as needed for wheezing or shortness of breath.  Dispense: 18 g; Refill: 0  3. Shortness of breath Bilateral lung wheezes noted  - continue on Albuterol CXR  - DG Chest 2 View; Future - Please get chest X-ray at [place] at [facility] then will call you with results.  4. Schizoaffective disorder, bipolar type ([place]) Status post hospital as above  Threw away her monthly injection Invega  Daughter will make outpatient Psychiatry follow up appointment as soon as possible  - continue on divalproex  Continues to be paranoid   5. Paroxysmal atrial fibrillation (HCC) HR controlled  Continue on metoprolol   6. Osteoarthritis of multiple joints, unspecified osteoarthritis type Continue to follow up with Orthopedic Dr.Duda fro cortisol injection  - continue current pain regimen   7. Unsteady gait Worsening due to arthritis  - fall and safety precaution advised   8. Dementia associated with other underlying disease with behavioral disturbance Requires assistance with her ADL's  Personal Care assistance forms completed given to Anita,CMA to fax to [facility] health as requested.   Family/ staff Communication: Reviewed plan of care with patient and daughters verbalized understanding   Labs/tests ordered: - DG Chest 2 View; Future  Next Appointment : 4 months for medical management of chronic issues.   Sandrea Hughs, NP

## 2021-06-09 ENCOUNTER — Telehealth: Payer: Self-pay | Admitting: Family

## 2021-06-09 NOTE — Telephone Encounter (Signed)
[name] Long called, and stated she wanted to be removed as a patient and threatened to shut the practice down.

## 2021-06-11 ENCOUNTER — Telehealth: Payer: Self-pay | Admitting: Family

## 2021-06-11 NOTE — Telephone Encounter (Signed)
I faxed over the [place] form to [facility] healthcare 06/11/21 and it successfully went through

## 2021-06-15 ENCOUNTER — Ambulatory Visit (INDEPENDENT_AMBULATORY_CARE_PROVIDER_SITE_OTHER): Payer: Medicare Other | Admitting: Orthopedic Surgery

## 2021-06-15 DIAGNOSIS — M170 Bilateral primary osteoarthritis of knee: Secondary | ICD-10-CM

## 2021-06-16 ENCOUNTER — Other Ambulatory Visit: Payer: Self-pay | Admitting: Family

## 2021-06-17 ENCOUNTER — Encounter: Payer: Self-pay | Admitting: Orthopedic Surgery

## 2021-06-17 ENCOUNTER — Telehealth: Payer: Self-pay | Admitting: Internal Medicine

## 2021-06-17 NOTE — Progress Notes (Signed)
Patient presented requesting hyaluronic acid injections in both knees.  We discussed that she would be eligible for repeat injection in February.  Patient then requested steroid injections for both knees.  Discussed that she has had a steroid injections in her knees a month ago and it would not be appropriate to repeat a steroid injection for her knees.  Patient states that her chronic left shoulder pain is better.  She states she now wants an injection in her right shoulder.  Patient states she also wants a back brace.  When I return to the exam room to discuss injection for the right shoulder patient left the office.

## 2021-06-17 NOTE — Telephone Encounter (Signed)
Patient dismissed from [facility] by Zenovia Jarred, MD, effective 06/15/21. Dismissal Letter sent out by 1st class mail. [employer]

## 2021-07-02 ENCOUNTER — Other Ambulatory Visit: Payer: Self-pay | Admitting: Orthopaedic Surgery

## 2021-07-02 ENCOUNTER — Telehealth: Payer: Self-pay | Admitting: Orthopedic Surgery

## 2021-07-02 MED ORDER — IBUPROFEN 800 MG PO TABS: 800.0000 mg | ORAL_TABLET | Freq: Three times a day (TID) | ORAL | 0 refills | Status: DC | PRN

## 2021-07-02 NOTE — Telephone Encounter (Signed)
Pt would like a refill on 800 mg ibuprofen   [facility] on [address]

## 2021-07-02 NOTE — Telephone Encounter (Signed)
Can you please advise since Dr. Sharol Given is out of the office?

## 2021-07-02 NOTE — Telephone Encounter (Signed)
I sent to [facility] on [address] per patient request.  Patient aware.

## 2021-07-03 ENCOUNTER — Other Ambulatory Visit: Payer: Self-pay | Admitting: Orthopedic Surgery

## 2021-07-03 DIAGNOSIS — K219 Gastro-esophageal reflux disease without esophagitis: Secondary | ICD-10-CM

## 2021-07-06 ENCOUNTER — Ambulatory Visit: Payer: Medicare Other | Admitting: Internal Medicine

## 2021-07-21 ENCOUNTER — Other Ambulatory Visit: Payer: Self-pay | Admitting: Family

## 2021-07-21 DIAGNOSIS — K219 Gastro-esophageal reflux disease without esophagitis: Secondary | ICD-10-CM

## 2021-07-22 ENCOUNTER — Telehealth: Payer: Self-pay | Admitting: Surgical

## 2021-07-22 ENCOUNTER — Telehealth: Payer: Self-pay | Admitting: Orthopedic Surgery

## 2021-07-22 NOTE — Telephone Encounter (Signed)
I called and lm on vm for pt to follow up with PCP to let them know and to call with any questions.

## 2021-07-22 NOTE — Telephone Encounter (Signed)
Pt called back asking to speak to dr. Sharol Given and then Autumn F. I read the message and told pt she need to follow up with PCP as instructed. Pt stated Dr. Sharol Given been treating her for her head concussion and need to speak to Autumn F. Please call pt at [phone].

## 2021-07-22 NOTE — Telephone Encounter (Signed)
Pt called and states her head is hurting her/ throbbing pain.   CB [phone]

## 2021-07-22 NOTE — Telephone Encounter (Signed)
Patient called office on-call number with complaint of concussion/headache.  I gave her a call back but was not able to get a hold of her.  Left voicemail recommending that she follow-up with her PCP if she wants further management of a concussion or if the pain is severe enough that is causing a unrelenting headache that is keeping her from functioning, I recommended she go to the emergency department for further evaluation to make sure this is not life-threatening.  She was advised to call the office on-call number back if she has any further questions.

## 2021-07-23 NOTE — Telephone Encounter (Signed)
I called pt and she states that her shoulder is painful and wants an appt advised can make an appt 07/28/21 at 1 pm with erin.

## 2021-07-26 ENCOUNTER — Telehealth: Payer: Self-pay | Admitting: *Deleted

## 2021-07-26 ENCOUNTER — Telehealth: Payer: Self-pay

## 2021-07-26 NOTE — Telephone Encounter (Signed)
Patient called and left message in triage voicemail requesting to start on depo provera or IUD. I called patient back and patient reports she would like to be sexually active with her "female friend" reports she has not been sexually active in years. I explained to patient postmenopausal and what this means. Patient reports she wants to feel protected and doesn't trust her "female friend" will keep the condom on ( reports men in past have removed condom during intercourse) patient said she just doesn't feel comfortable having intercourse without female contraception due to the fear of pregnancy. I discussed STD's with patient and the importance of condoms. Patient said she would like to have a GYN exam here, last GYN exam 3 years ago. However she was told she owes the office a $50 no show bill from 10/22. Patient said she did not schedule this reports her sister scheduled this. I asked patient did she want to speak with appointments regarding this matter and patient said no. Patient said she will call another office, she does not want to pay the $50 no show fee because she did not schedule this.   Encounter closed.

## 2021-07-26 NOTE — Telephone Encounter (Signed)
Penicillin is usually give for few days.No refill required.

## 2021-07-26 NOTE — Telephone Encounter (Signed)
Patient called and wanted to know if she suppose to be taking Penicillin and if so can she get another refill. She said that the bottle had Marlowe Sax, NP name on it. Also she would like to inform [name] Ngetich,NP she would like a pap for her appointment on Friday, 07/30/21.

## 2021-07-28 ENCOUNTER — Ambulatory Visit: Payer: Medicare Other | Admitting: Family

## 2021-07-30 ENCOUNTER — Encounter: Payer: Self-pay | Admitting: Family

## 2021-07-30 ENCOUNTER — Other Ambulatory Visit: Payer: Self-pay

## 2021-07-30 ENCOUNTER — Ambulatory Visit (INDEPENDENT_AMBULATORY_CARE_PROVIDER_SITE_OTHER): Payer: Medicare Other | Admitting: Family

## 2021-07-30 VITALS — BP 110/62 | HR 78 | Temp 97.0°F | Resp 16 | Ht 65.0 in | Wt 161.2 lb

## 2021-07-30 DIAGNOSIS — J449 Chronic obstructive pulmonary disease, unspecified: Secondary | ICD-10-CM

## 2021-07-30 DIAGNOSIS — R0981 Nasal congestion: Secondary | ICD-10-CM | POA: Diagnosis not present

## 2021-07-30 DIAGNOSIS — R0602 Shortness of breath: Secondary | ICD-10-CM

## 2021-07-30 DIAGNOSIS — B3731 Acute candidiasis of vulva and vagina: Secondary | ICD-10-CM | POA: Diagnosis not present

## 2021-07-30 MED ORDER — DOXYCYCLINE HYCLATE 100 MG PO TABS: 100.0000 mg | ORAL_TABLET | Freq: Two times a day (BID) | ORAL | 0 refills | Status: AC

## 2021-07-30 MED ORDER — FLUCONAZOLE 150 MG PO TABS: ORAL_TABLET | ORAL | 0 refills | Status: DC

## 2021-07-30 MED ORDER — FLUTICASONE PROPIONATE 50 MCG/ACT NA SUSP: 2.0000 | Freq: Every day | NASAL | 6 refills | Status: DC
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
## 2021-07-30 NOTE — Telephone Encounter (Signed)
Patient came in office for appointment tiday, 07/30/2021.

## 2021-07-30 NOTE — Patient Instructions (Addendum)
-   Notify provider if symptoms worsen or fail to improve  °

## 2021-08-01 ENCOUNTER — Other Ambulatory Visit: Payer: Self-pay | Admitting: Family

## 2021-08-01 DIAGNOSIS — K219 Gastro-esophageal reflux disease without esophagitis: Secondary | ICD-10-CM

## 2021-08-02 ENCOUNTER — Other Ambulatory Visit: Payer: Self-pay | Admitting: Family

## 2021-08-02 DIAGNOSIS — K219 Gastro-esophageal reflux disease without esophagitis: Secondary | ICD-10-CM

## 2021-08-04 ENCOUNTER — Telehealth: Payer: Self-pay

## 2021-08-04 NOTE — Telephone Encounter (Signed)
May refill Sucralfate  # 3

## 2021-08-04 NOTE — Telephone Encounter (Signed)
FYI I called pt and lm on mv to advise that we had to cx appt on 08/19/21 and resch for 08/23/21 and advised that I confirmed we will have gel injection available on that day.

## 2021-08-04 NOTE — Telephone Encounter (Signed)
Noted  

## 2021-08-04 NOTE — Telephone Encounter (Signed)
Patient is able to receive bilateral gel injections after 08/19/2021 due to receiving last gel injections on 02/16/2021. Will submit for Monovisc, bilateral knee.

## 2021-08-04 NOTE — Telephone Encounter (Signed)
Daughter called requesting refill on patient's Sucralfate 1gm/22ml   Stated that patient's takes this medication and needs it. Stated that she takes the Liquid four times daily and it helps when she has to eat.   Not in Current medication list.  Can we add and Refill.  Please Advise.
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
## 2021-08-04 NOTE — Telephone Encounter (Signed)
VOB submitted for Monovisc, bilateral knee  

## 2021-08-05 MED ORDER — SUCRALFATE 1 GM/10ML PO SUSP: 1.0000 g | Freq: Three times a day (TID) | ORAL | 3 refills | Status: DC

## 2021-08-05 NOTE — Telephone Encounter (Signed)
Medication list updated and Rx sent to Pharmacy.

## 2021-08-05 NOTE — Addendum Note (Signed)
Addended by: Rafael Bihari A on: 08/05/2021 09:22 AM   Modules accepted: Orders

## 2021-08-06 ENCOUNTER — Telehealth: Payer: Self-pay

## 2021-08-06 NOTE — Telephone Encounter (Signed)
Approved for Monovisc, bilateral knee. Buy & Bill Must meet Medicare deductible Patient will be responsible for 20% OOP. No Co-pay No PA required  Appt. 08/23/2021 with Dr. Sharol Given

## 2021-08-18 ENCOUNTER — Other Ambulatory Visit: Payer: Medicare Other

## 2021-08-19 ENCOUNTER — Ambulatory Visit (INDEPENDENT_AMBULATORY_CARE_PROVIDER_SITE_OTHER): Payer: Medicare Other | Admitting: Orthopedic Surgery

## 2021-08-19 ENCOUNTER — Ambulatory Visit: Payer: Medicare Other | Admitting: Orthopedic Surgery

## 2021-08-19 ENCOUNTER — Other Ambulatory Visit: Payer: Self-pay

## 2021-08-19 DIAGNOSIS — M170 Bilateral primary osteoarthritis of knee: Secondary | ICD-10-CM

## 2021-08-19 DIAGNOSIS — M7542 Impingement syndrome of left shoulder: Secondary | ICD-10-CM

## 2021-08-19 DIAGNOSIS — M25571 Pain in right ankle and joints of right foot: Secondary | ICD-10-CM

## 2021-08-19 DIAGNOSIS — M7541 Impingement syndrome of right shoulder: Secondary | ICD-10-CM

## 2021-08-23 ENCOUNTER — Ambulatory Visit: Payer: Medicare Other | Admitting: Orthopedic Surgery

## 2021-08-24 ENCOUNTER — Other Ambulatory Visit: Payer: Self-pay | Admitting: Orthopedic Surgery

## 2021-08-31 ENCOUNTER — Encounter: Payer: Self-pay | Admitting: Orthopedic Surgery

## 2021-08-31 DIAGNOSIS — M7541 Impingement syndrome of right shoulder: Secondary | ICD-10-CM | POA: Diagnosis not present

## 2021-08-31 DIAGNOSIS — M7542 Impingement syndrome of left shoulder: Secondary | ICD-10-CM | POA: Diagnosis not present

## 2021-08-31 NOTE — Progress Notes (Signed)
Office Visit Note   Patient: [name] Long           Date of Birth: September 16, 1950           MRN: [id number] Visit Date: 08/19/2021              Requested by: Sandrea Hughs, NP [address] [place],  [place] [address] PCP: Sandrea Hughs, NP  Chief Complaint  Patient presents with   Left Shoulder - Pain   Right Shoulder - Pain   Right Foot - Pain      HPI: Patient is a 71 year old woman who presents with multiple medical issues.  She has been having recurrent impingement symptoms in both shoulders as well as pain with ambulation in the right foot and right ankle.  Still has chronic bilateral knee pain.  Assessment & Plan: Visit Diagnoses:  1. Bilateral primary osteoarthritis of knee   2. Impingement syndrome of right shoulder   3. Impingement syndrome of left shoulder   4. Pain in right ankle and joints of right foot     Plan: Both shoulders were injected she tolerated this well we will give her a short fracture boot to provide stability for the foot and ankle.  Follow-Up Instructions: Return if symptoms worsen or fail to improve.   Ortho Exam  Patient is alert, oriented, no adenopathy, well-dressed, normal affect, normal respiratory effort. Examination patient has abduction and flexion to 90 degrees of both shoulders.  She has pain with Neer and Hawkins impingement test bilaterally she has pain to palpation of the biceps tendon bilaterally.  There is no effusion of either knee.  She has global tenderness to palpation around the right foot and ankle.  Imaging: No results found. No images are attached to the encounter.  Labs: Lab Results  Component Value Date   HGBA1C 8.3 (H) 04/19/2021   HGBA1C 8.6 (H) 10/01/2020   HGBA1C 8.1 (H) 05/26/2020   LABORGA NO GROWTH 12/04/2014     Lab Results  Component Value Date   ALBUMIN 2.8 (L) 05/07/2021   ALBUMIN 3.4 (L) 04/17/2021   ALBUMIN 3.1 (L) 09/21/2020    Lab Results  Component Value Date   MG 1.7 09/04/2020    MG 1.7 12/25/2016   MG 1.8 06/10/2016   Lab Results  Component Value Date   VD25OH 43.0 02/23/2018   VD25OH 30 05/06/2015   VD25OH 12 (L) 12/18/2014    No results found for: PREALBUMIN CBC EXTENDED Latest Ref Rng & Units 05/07/2021 05/07/2021 04/17/2021  WBC 4.0 - 10.5 K/uL - 5.1 6.3  RBC 3.87 - 5.11 MIL/uL - 3.09(L) 3.53(L)  HGB 12.0 - 15.0 g/dL 10.5(L) 9.6(L) 11.2(L)  HCT 36.0 - 46.0 % 31.0(L) 29.7(L) 34.2(L)  PLT 150 - 400 K/uL - 158 249  NEUTROABS 1.7 - 7.7 K/uL - 3.1 2.4  LYMPHSABS 0.7 - 4.0 K/uL - 1.1 2.7     There is no height or weight on file to calculate BMI.  Orders:  No orders of the defined types were placed in this encounter.  No orders of the defined types were placed in this encounter.    Procedures: Large Joint Inj: bilateral subacromial bursa on 08/31/2021 1:33 PM Indications: diagnostic evaluation and pain Details: 22 G 1.5 in needle, posterior approach  Arthrogram: No  Outcome: tolerated well, no immediate complications Procedure, treatment alternatives, risks and benefits explained, specific risks discussed. Consent was given by the patient. Immediately prior to procedure a time out was  called to verify the correct patient, procedure, equipment, support staff and site/side marked as required. Patient was prepped and draped in the usual sterile fashion.     Clinical Data: No additional findings.  ROS:  All other systems negative, except as noted in the HPI. Review of Systems  Objective: Vital Signs: There were no vitals taken for this visit.  Specialty Comments:  No specialty comments available.  PMFS History: Patient Active Problem List   Diagnosis Date Noted   Bipolar I disorder, most recent episode depressed, severe w psychosis ([place])    Dementia associated with other underlying disease with behavioral disturbance 03/27/2020   GERD (gastroesophageal reflux disease) 12/27/2019   Post-menopausal atrophic vaginitis 12/13/2019   Vagina,  candidiasis 12/10/2019   Vaginal itching 11/10/2019   Dysphagia 07/02/2019   Screening examination for STD (sexually transmitted disease) 03/25/2019   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   Other chronic pain 12/03/2018   Dizziness of unknown cause 08/04/2018   Tremor 07/15/2018   Atrial flutter with rapid ventricular response ([place]) 07/10/2017   Atrial fibrillation ([place]) 07/05/2017   COPD (chronic obstructive pulmonary disease) ([place]) 01/22/2017   Pedal edema    Congestive heart failure (CHF) ([place])    Chronic bilateral low back pain without sciatica 08/15/2016   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 08/15/2016   Schizoaffective disorder ([place])    Hypokalemia 01/22/2015   Osteoarthritis, multiple sites 06/08/2011   Dysuria 02/14/2011   CHRONIC KIDNEY DISEASE STAGE II (MILD) 09/14/2009   Hypothyroidism 08/31/2006   Type 2 diabetes mellitus ([place]) 08/31/2006   HYPERCHOLESTEROLEMIA 08/31/2006   Tobacco abuse 08/31/2006   HYPERTENSION, BENIGN SYSTEMIC 08/31/2006   Past Medical History:  Diagnosis Date   Abscessed tooth 02/09/2020   Acute on chronic congestive heart failure ([place])    Adenomatous colon polyp    Arthritis    "real bad; all over" (07/12/2017)   Asthma    Atrial fibrillation ([place])    BIPOLAR DISORDER 08/31/2006   Qualifier: Diagnosis of  By: Dorathy Daft MD, Marjory Lies     CHRONIC KIDNEY DISEASE STAGE II (MILD) 09/14/2009   Annotation: eGFR 36 Qualifier: Diagnosis of  By: Jess Barters MD, Erik     Chronic lower back pain    Congestive heart failure (CHF) ([place])    COPD (chronic obstructive pulmonary disease) ([place]) 09/23/2010   Diagnosed at [facility] in [facility] (Dr. Annamaria Boots)    DM (diabetes mellitus) type II controlled with renal manifestation ([place]) 08/31/2006   Qualifier: Diagnosis of  By: Dorathy Daft MD, Marjory Lies     Dyspnea    "all my life; since 6th grade" (07/12/2017)   Fatty liver    HYPERCHOLESTEROLEMIA 08/31/2006   Intolerance to Lipitor OK on Crestor but  medicaid no longer covering     HYPERTENSION, BENIGN SYSTEMIC 08/31/2006   Qualifier: Diagnosis of  By: Dorathy Daft MD, Aaron     Hypokalemia    HYPOTHYROIDISM, UNSPECIFIED 08/31/2006   Qualifier: Diagnosis of  By: Dorathy Daft MD, Marjory Lies     Leg swelling 03/14/2018   Pneumonia    "3 times" (07/12/2017)   Pulmonary nodule    Renal cyst    Schizophrenia ([place])    Scoliosis    Stomach problems    Thyroid disorder     Family History  Problem Relation Age of Onset   Breast cancer Sister    Heart disease Mother 56   Rectal cancer Mother    Diabetes Father 9   Hypertension Father    Heart disease Brother  Asthma Daughter    Asthma Son    Breast cancer Sister    Asthma Daughter    Colon cancer Maternal Grandmother    Stomach cancer Neg Hx    Esophageal cancer Neg Hx    Pancreatic cancer Neg Hx     Past Surgical History:  Procedure Laterality Date   CESAREAN SECTION     FOOT FRACTURE SURGERY Right    "steel plate in it"   FOOT SURGERY     " born w/dislocated foot"   FRACTURE SURGERY     KNEE ARTHROSCOPY Right    TOE SURGERY Bilateral    "both pinky toes"   TONSILLECTOMY AND ADENOIDECTOMY     Social History   Occupational History   Not on file  Tobacco Use   Smoking status: Former    Packs/day: 0.25    Years: 45.00    Pack years: 11.25    Types: Cigarettes   Smokeless tobacco: Never   Tobacco comments:    patient states she smokes 1 cigarette per month  Vaping Use   Vaping Use: Never used  Substance and Sexual Activity   Alcohol use: Not Currently    Comment: 07/12/2017 "stopped 2 yr ago; did have a drink over the holidays recently"   Drug use: No   Sexual activity: Not Currently

## 2021-09-09 ENCOUNTER — Ambulatory Visit (INDEPENDENT_AMBULATORY_CARE_PROVIDER_SITE_OTHER): Payer: Medicare Other | Admitting: Orthopedic Surgery

## 2021-09-09 ENCOUNTER — Other Ambulatory Visit: Payer: Self-pay

## 2021-09-09 DIAGNOSIS — M170 Bilateral primary osteoarthritis of knee: Secondary | ICD-10-CM

## 2021-09-09 DIAGNOSIS — M25531 Pain in right wrist: Secondary | ICD-10-CM

## 2021-09-10 ENCOUNTER — Telehealth: Payer: Self-pay

## 2021-09-10 ENCOUNTER — Other Ambulatory Visit: Payer: Self-pay | Admitting: Family

## 2021-09-10 DIAGNOSIS — J449 Chronic obstructive pulmonary disease, unspecified: Secondary | ICD-10-CM

## 2021-09-10 MED ORDER — ALBUTEROL SULFATE HFA 108 (90 BASE) MCG/ACT IN AERS: 2.0000 | INHALATION_SPRAY | Freq: Four times a day (QID) | RESPIRATORY_TRACT | 1 refills | Status: DC | PRN

## 2021-09-10 MED ORDER — METOPROLOL SUCCINATE ER 50 MG PO TB24: 50.0000 mg | ORAL_TABLET | Freq: Every day | ORAL | 11 refills | Status: DC

## 2021-09-10 NOTE — Telephone Encounter (Signed)
Due to frequent confusion around medications I will send this request to Ngetich, [place], NP to review and approve pended rx's if warranted  ?

## 2021-09-10 NOTE — Telephone Encounter (Signed)
-----   Message from Willette Brace sent at 09/10/2021  2:46 PM EST ----- ?Regarding: MED REFILL ?[name] Long called today requesting her Toprol be filled. She said her inhaler needed to be refilled if we could call it in ? ? ?

## 2021-09-10 NOTE — Telephone Encounter (Signed)
Medication refilled

## 2021-09-12 ENCOUNTER — Encounter: Payer: Self-pay | Admitting: Orthopedic Surgery

## 2021-09-12 DIAGNOSIS — M170 Bilateral primary osteoarthritis of knee: Secondary | ICD-10-CM | POA: Diagnosis not present

## 2021-09-12 MED ORDER — HYALURONAN 88 MG/4ML IX SOSY
88.0000 mg | PREFILLED_SYRINGE | INTRA_ARTICULAR | Status: AC | PRN
  Administered 2021-09-12: 88 mg via INTRA_ARTICULAR

## 2021-09-12 NOTE — Progress Notes (Signed)
? ?Office Visit Note ?  ?Patient: [name] Long           ?Date of Birth: [birth year]           ?MRN: [id number] ?Visit Date: 09/09/2021 ?             ?Requested by: Sandrea Hughs, NP ?[address] ?[place],  [place] [address] ?PCP: Ngetich, Nelda Bucks, NP ? ?Chief Complaint  ?Patient presents with  ? Right Knee - Follow-up  ?  monovisc  ? Left Knee - Follow-up  ?  monovisc  ? ? ? ? ?HPI: ?Patient is a 71 year old woman who presents for 2 separate issues.  Patient states she has acute pain in the right wrist.  Patient also presents for Monovisc injections for both knees. ? ?Patient states she had a heart attack after she was seen in the office last visit. ? ?Assessment & Plan: ?Visit Diagnoses:  ?1. Bilateral primary osteoarthritis of knee   ?2. Pain in right wrist   ? ? ?Plan: Both knees were injected the examination of the right wrist is normal. ? ?Follow-Up Instructions: Return if symptoms worsen or fail to improve.  ? ?Ortho Exam ? ?Patient is alert, oriented, no adenopathy, well-dressed, normal affect, normal respiratory effort. ?Examination of the right wrist she has no pain to palpation over the scaphoid scapholunate or TFCC.  Ulnar grind is negative first dorsal extensor compartment is not tender.  She has no focal motor or sensory weakness.  Examination of both knees she has chronic pain in both knees and she underwent a Monovisc injection bilaterally. ? ?Imaging: ?No results found. ?No images are attached to the encounter. ? ?Labs: ?Lab Results  ?Component Value Date  ? HGBA1C 8.3 (H) 04/19/2021  ? HGBA1C 8.6 (H) 10/01/2020  ? HGBA1C 8.1 (H) 05/26/2020  ? LABORGA NO GROWTH 12/04/2014  ? ? ? ?Lab Results  ?Component Value Date  ? ALBUMIN 2.8 (L) 05/07/2021  ? ALBUMIN 3.4 (L) 04/17/2021  ? ALBUMIN 3.1 (L) 09/21/2020  ? ? ?Lab Results  ?Component Value Date  ? MG 1.7 09/04/2020  ? MG 1.7 12/25/2016  ? MG 1.8 06/10/2016  ? ?Lab Results  ?Component Value Date  ? VD25OH 43.0 02/23/2018  ? VD25OH 30 05/06/2015  ?  VD25OH 12 (L) 12/18/2014  ? ? ?No results found for: PREALBUMIN ?CBC EXTENDED Latest Ref Rng & Units 05/07/2021 05/07/2021 04/17/2021  ?WBC 4.0 - 10.5 K/uL - 5.1 6.3  ?RBC 3.87 - 5.11 MIL/uL - 3.09(L) 3.53(L)  ?HGB 12.0 - 15.0 g/dL 10.5(L) 9.6(L) 11.2(L)  ?HCT 36.0 - 46.0 % 31.0(L) 29.7(L) 34.2(L)  ?PLT 150 - 400 K/uL - 158 249  ?NEUTROABS 1.7 - 7.7 K/uL - 3.1 2.4  ?LYMPHSABS 0.7 - 4.0 K/uL - 1.1 2.7  ? ? ? ?There is no height or weight on file to calculate BMI. ? ?Orders:  ?No orders of the defined types were placed in this encounter. ? ?No orders of the defined types were placed in this encounter. ? ? ? Procedures: ?Large Joint Inj: bilateral knee on 09/12/2021 11:01 AM ?Indications: pain and diagnostic evaluation ?Details: 22 G 1.5 in needle, anteromedial approach ? ?Arthrogram: No ? ?Medications (Right): 88 mg Hyaluronan 88 MG/4ML ?Medications (Left): 88 mg Hyaluronan 88 MG/4ML ?Outcome: tolerated well, no immediate complications ?Procedure, treatment alternatives, risks and benefits explained, specific risks discussed. Consent was given by the patient. Immediately prior to procedure a time out was called to verify the correct patient, procedure, equipment,  support staff and site/side marked as required. Patient was prepped and draped in the usual sterile fashion.  ? ? ? ?Clinical Data: ?No additional findings. ? ?ROS: ? ?All other systems negative, except as noted in the HPI. ?Review of Systems ? ?Objective: ?Vital Signs: There were no vitals taken for this visit. ? ?Specialty Comments:  ?No specialty comments available. ? ?PMFS History: ?Patient Active Problem List  ? Diagnosis Date Noted  ? Bipolar I disorder, most recent episode depressed, severe w psychosis ([place])   ? Dementia associated with other underlying disease with behavioral disturbance 03/27/2020  ? GERD (gastroesophageal reflux disease) 12/27/2019  ? Post-menopausal atrophic vaginitis 12/13/2019  ? Vagina, candidiasis 12/10/2019  ? Vaginal itching  11/10/2019  ? Dysphagia 07/02/2019  ? Screening examination for STD (sexually transmitted disease) 03/25/2019  ? 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 Other chronic pain 12/03/2018  ? Dizziness of unknown cause 08/04/2018  ? Tremor 07/15/2018  ? Atrial flutter with rapid ventricular response ([place]) 07/10/2017  ? Atrial fibrillation ([place]) 07/05/2017  ? COPD (chronic obstructive pulmonary disease) ([place]) 01/22/2017  ? Pedal edema   ? Congestive heart failure (CHF) ([place])   ? Chronic bilateral low back pain without sciatica 08/15/2016  ? 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 08/15/2016  ? Schizoaffective disorder ([place])   ? Hypokalemia 01/22/2015  ? Osteoarthritis, multiple sites 06/08/2011  ? Dysuria 02/14/2011  ? CHRONIC KIDNEY DISEASE STAGE II (MILD) 09/14/2009  ? Hypothyroidism 08/31/2006  ? Type 2 diabetes mellitus ([place]) 08/31/2006  ? HYPERCHOLESTEROLEMIA 08/31/2006  ? Tobacco abuse 08/31/2006  ? HYPERTENSION, BENIGN SYSTEMIC 08/31/2006  ? ?Past Medical History:  ?Diagnosis Date  ? Abscessed tooth 02/09/2020  ? Acute on chronic congestive heart failure ([place])   ? Adenomatous colon polyp   ? Arthritis   ? "real bad; all over" (07/12/2017)  ? Asthma   ? Atrial fibrillation ([place])   ? BIPOLAR DISORDER 08/31/2006  ? Qualifier: Diagnosis of  By: Dorathy Daft MD, Marjory Lies    ? CHRONIC KIDNEY DISEASE STAGE II (MILD) 09/14/2009  ? Annotation: eGFR 64 Qualifier: Diagnosis of  By: Jess Barters MD, [name]    ? Chronic lower back pain   ? Congestive heart failure (CHF) ([place])   ? COPD (chronic obstructive pulmonary disease) ([place]) 09/23/2010  ? Diagnosed at [facility] in 2008 (Dr. Annamaria Boots)   ? DM (diabetes mellitus) type II controlled with renal manifestation ([place]) 08/31/2006  ? Qualifier: Diagnosis of  By: Dorathy Daft MD, Marjory Lies    ? Dyspnea   ? "all my life; since 6th grade" (07/12/2017)  ? Fatty liver   ? HYPERCHOLESTEROLEMIA 08/31/2006  ? Intolerance to Lipitor OK on Crestor but medicaid no longer covering    ?  HYPERTENSION, BENIGN SYSTEMIC 08/31/2006  ? Qualifier: Diagnosis of  By: Dorathy Daft MD, Marjory Lies    ? Hypokalemia   ? HYPOTHYROIDISM, UNSPECIFIED 08/31/2006  ? Qualifier: Diagnosis of  By: Dorathy Daft MD, Marjory Lies    ? Leg swelling 03/14/2018  ? Pneumonia   ? "3 times" (07/12/2017)  ? Pulmonary nodule   ? Renal cyst   ? Schizophrenia ([place])   ? Scoliosis   ? Stomach problems   ? Thyroid disorder   ?  ?Family History  ?Problem Relation Age of Onset  ? Breast cancer Sister   ? Heart disease Mother 47  ? Rectal cancer Mother   ? Diabetes Father 63  ? Hypertension Father   ? Heart disease Brother   ? Asthma Daughter   ?  Asthma Son   ? Breast cancer Sister   ? Asthma Daughter   ? Colon cancer Maternal Grandmother   ? Stomach cancer Neg Hx   ? Esophageal cancer Neg Hx   ? Pancreatic cancer Neg Hx   ?  ?Past Surgical History:  ?Procedure Laterality Date  ? CESAREAN SECTION    ? FOOT FRACTURE SURGERY Right   ? "steel plate in it"  ? FOOT SURGERY    ? " born w/dislocated foot"  ? FRACTURE SURGERY    ? KNEE ARTHROSCOPY Right   ? TOE SURGERY Bilateral   ? "both pinky toes"  ? TONSILLECTOMY AND ADENOIDECTOMY    ? ?Social History  ? ?Occupational History  ? Not on file  ?Tobacco Use  ? Smoking status: Former  ?  Packs/day: 0.25  ?  Years: 45.00  ?  Pack years: 11.25  ?  Types: Cigarettes  ? Smokeless tobacco: Never  ? Tobacco comments:  ?  patient states she smokes 1 cigarette per month  ?Vaping Use  ? Vaping Use: Never used  ?Substance and Sexual Activity  ? Alcohol use: Not Currently  ?  Comment: 07/12/2017 "stopped 2 yr ago; did have a drink over the holidays recently"  ? Drug use: No  ? Sexual activity: Not Currently  ? ? ? ? ? ?

## 2021-09-14 ENCOUNTER — Telehealth: Payer: Self-pay

## 2021-09-14 NOTE — Telephone Encounter (Signed)
Patient called into the office stating that she is in pain and that the gel injection are not suppose to make her hurt like she is. She would like Dr. Sharol Given to call her back personally. She stated that she is still having trouble out of her sister and that she finally got her new insurance cards in as well  ?

## 2021-09-19 ENCOUNTER — Other Ambulatory Visit: Payer: Self-pay | Admitting: Orthopedic Surgery

## 2021-09-19 DIAGNOSIS — R0602 Shortness of breath: Secondary | ICD-10-CM

## 2021-09-19 MED ORDER — PREDNISONE 10 MG PO TABS: ORAL_TABLET | ORAL | 0 refills | Status: AC

## 2021-09-19 MED ORDER — MONTELUKAST SODIUM 10 MG PO TABS: 10.0000 mg | ORAL_TABLET | Freq: Every day | ORAL | 3 refills | Status: DC

## 2021-09-19 NOTE — Progress Notes (Signed)
Patient called requesting inhaler for increased shortness of breath. Cannot recall name of medication. She reports taking additional inhaler 2 years ago. She reports already taking albuterol, using 3-4 times daily. She asked her pharmacy about her inhaler refill and states it was denied by provider. Albuterol was refilled 03/10. No other inhaler is listed on medication list. Prednisone and Singulair prescription sent into pharmacy. Advised to report to ED of sob persists. I also advised her to call PCP office tomorrow and schedule to be seen.  ?

## 2021-09-24 ENCOUNTER — Ambulatory Visit: Payer: Medicare Other | Admitting: Family

## 2021-09-29 ENCOUNTER — Telehealth: Payer: Self-pay | Admitting: Family

## 2021-09-29 NOTE — Telephone Encounter (Signed)
Pt called on 09/28/21 and stated she wanted her  appointment for 09/30/21 canceled ?

## 2021-09-30 ENCOUNTER — Ambulatory Visit: Payer: Medicare Other | Admitting: Family

## 2021-10-04 ENCOUNTER — Other Ambulatory Visit: Payer: Self-pay | Admitting: Family

## 2021-10-12 ENCOUNTER — Ambulatory Visit: Payer: Medicare Other | Admitting: Family

## 2021-10-13 ENCOUNTER — Telehealth: Payer: Self-pay | Admitting: Orthopedic Surgery

## 2021-10-13 NOTE — Telephone Encounter (Signed)
I called pt and advised that Dr. Sharol Given can not write rx for sleep medication. Pt states that she never asked for that.  ?

## 2021-10-13 NOTE — Telephone Encounter (Signed)
Patient called asked if she can get something to help her sleep and something a little stronger for the pain. Patient said she is only sleeping 2 to 3 hours. Patient said she can hardly walk. The number to contact patient is [phone] ?

## 2021-10-18 ENCOUNTER — Other Ambulatory Visit: Payer: Self-pay | Admitting: Nurse Practitioner

## 2021-10-18 ENCOUNTER — Encounter (HOSPITAL_COMMUNITY): Payer: Self-pay | Admitting: Emergency Medicine

## 2021-10-18 ENCOUNTER — Ambulatory Visit (HOSPITAL_COMMUNITY)
Admission: EM | Admit: 2021-10-18 | Discharge: 2021-10-18 | Disposition: A | Discharge status: Home / Self Care | Payer: Medicare Other | Attending: Nurse Practitioner | Admitting: Nurse Practitioner

## 2021-10-18 ENCOUNTER — Other Ambulatory Visit: Payer: Self-pay

## 2021-10-18 DIAGNOSIS — R0982 Postnasal drip: Secondary | ICD-10-CM | POA: Insufficient documentation

## 2021-10-18 DIAGNOSIS — R0981 Nasal congestion: Secondary | ICD-10-CM | POA: Diagnosis not present

## 2021-10-18 DIAGNOSIS — R1013 Epigastric pain: Secondary | ICD-10-CM | POA: Insufficient documentation

## 2021-10-18 LAB — POCT URINALYSIS DIPSTICK, ED / UC
Bilirubin Urine: NEGATIVE
Glucose, UA: >=1000 mg/dL — AB
Ketones, ur: NEGATIVE mg/dL
Leukocytes,Ua: NEGATIVE
Nitrite: NEGATIVE
Protein, ur: NEGATIVE mg/dL
Specific Gravity, Urine: 1.01 (ref 1.005–1.030)
Urobilinogen, UA: 0.2 mg/dL (ref 0.0–1.0)
pH: 5.5 (ref 5.0–8.0)

## 2021-10-18 MED ORDER — LIDOCAINE VISCOUS HCL 2 % MT SOLN
15.0000 mL | Freq: Once | OROMUCOSAL | Status: AC
  Administered 2021-10-18: 15 mL via ORAL

## 2021-10-18 MED ORDER — FLUTICASONE PROPIONATE 50 MCG/ACT NA SUSP: 2.0000 | Freq: Every day | NASAL | 0 refills | Status: DC

## 2021-10-18 MED ORDER — ALUM & MAG HYDROXIDE-SIMETH 200-200-20 MG/5ML PO SUSP
ORAL | Status: AC
  Filled 2021-10-18: qty 30

## 2021-10-18 MED ORDER — LIDOCAINE VISCOUS HCL 2 % MT SOLN
OROMUCOSAL | Status: AC
  Filled 2021-10-18: qty 15

## 2021-10-18 MED ORDER — ALUM & MAG HYDROXIDE-SIMETH 200-200-20 MG/5ML PO SUSP
30.0000 mL | Freq: Once | ORAL | Status: AC
  Administered 2021-10-18: 30 mL via ORAL

## 2021-10-18 NOTE — Discharge Instructions (Addendum)
-   We have given you Maalox/Mylanta and lidocaine today to help with your epigastric abdominal pain ?-Please follow-up with a stomach provider as soon as possible for evaluation of your epigastric abdominal pain. ?- if your symptoms worsen, you needed to the emergency room. ?-I have sent a refill of your Flonase to the pharmacy to help with the postnasal drainage ?

## 2021-10-18 NOTE — ED Notes (Signed)
Urine in the lab  

## 2021-10-18 NOTE — ED Triage Notes (Addendum)
Patient reports episodes of abdominal pain since January.  Reports center, upper abdominal pain.  Denies vomiting.  Reports she is being treated for uri.  Patient denies pain with urination, but genitalia feels "raw" ?

## 2021-10-18 NOTE — ED Provider Notes (Signed)
?[facility] ? ? ? ?CSN: [id number] ?Arrival date & time: 10/18/21  1536 ? ? ?  ? ?History   ?Chief Complaint ?Chief Complaint  ?Patient presents with  ? Abdominal Pain  ? ? ?HPI ?[name] Long is a 71 y.o. female.  ? ?Patient presents today for sharp, severe, upper abdominal pain that has been waxing and waning on and off for the past few months.  Denies fevers, nausea/vomiting blood, blood in her stool.  No decreased appetite or unexplained weight loss. Reports she has been taking omeprazole and Carafate as prescribed.  Has been taking Pepto Bismol which helps temporarily.  Reports history of ulcer and hiatal hernia years ago.  No longer follows with gastroenterologist.  ? ?Also reports urinary irritation since taking fluconazole pills a couple of days ago.  Denies fevers, nausea/vomiting.  Denies blood in her urine.  She is requesting estrace cream and "hormone pills," reports her PCP stopped prescribing them.  She is looking for a new PCP. ? ? ?Past Medical History:  ?Diagnosis Date  ? Abscessed tooth 02/09/2020  ? Acute on chronic congestive heart failure ([place])   ? Adenomatous colon polyp   ? Arthritis   ? "real bad; all over" (07/12/2017)  ? Asthma   ? Atrial fibrillation ([place])   ? BIPOLAR DISORDER 08/31/2006  ? Qualifier: Diagnosis of  By: Dorathy Daft MD, Marjory Lies    ? CHRONIC KIDNEY DISEASE STAGE II (MILD) 09/14/2009  ? Annotation: eGFR 64 Qualifier: Diagnosis of  By: Jess Barters MD, [name]    ? Chronic lower back pain   ? Congestive heart failure (CHF) ([place])   ? COPD (chronic obstructive pulmonary disease) ([place]) 09/23/2010  ? Diagnosed at [facility] in 2008 (Dr. Annamaria Boots)   ? DM (diabetes mellitus) type II controlled with renal manifestation ([place]) 08/31/2006  ? Qualifier: Diagnosis of  By: Dorathy Daft MD, Marjory Lies    ? Dyspnea   ? "all my life; since 6th grade" (07/12/2017)  ? Fatty liver   ? HYPERCHOLESTEROLEMIA 08/31/2006  ? Intolerance to Lipitor OK on Crestor but medicaid no longer covering    ? HYPERTENSION, BENIGN  SYSTEMIC 08/31/2006  ? Qualifier: Diagnosis of  By: Dorathy Daft MD, Marjory Lies    ? Hypokalemia   ? HYPOTHYROIDISM, UNSPECIFIED 08/31/2006  ? Qualifier: Diagnosis of  By: Dorathy Daft MD, Marjory Lies    ? Leg swelling 03/14/2018  ? Pneumonia   ? "3 times" (07/12/2017)  ? Pulmonary nodule   ? Renal cyst   ? Schizophrenia ([place])   ? Scoliosis   ? Stomach problems   ? Thyroid disorder   ? ? ?Patient Active Problem List  ? Diagnosis Date Noted  ? Bipolar I disorder, most recent episode depressed, severe w psychosis ([place])   ? Dementia associated with other underlying disease with behavioral disturbance ([place]) 03/27/2020  ? GERD (gastroesophageal reflux disease) 12/27/2019  ? Post-menopausal atrophic vaginitis 12/13/2019  ? Vagina, candidiasis 12/10/2019  ? Vaginal itching 11/10/2019  ? Dysphagia 07/02/2019  ? Screening examination for STD (sexually transmitted disease) 03/25/2019  ? 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 Other chronic pain 12/03/2018  ? Dizziness of unknown cause 08/04/2018  ? Tremor 07/15/2018  ? Atrial flutter with rapid ventricular response ([place]) 07/10/2017  ? Atrial fibrillation ([place]) 07/05/2017  ? COPD (chronic obstructive pulmonary disease) ([place]) 01/22/2017  ? Pedal edema   ? Congestive heart failure (CHF) ([place])   ? Chronic bilateral low back pain without sciatica 08/15/2016  ? 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 Schizoaffective disorder ([place])   ? Hypokalemia 01/22/2015  ? Osteoarthritis, multiple sites 06/08/2011  ? Dysuria 02/14/2011  ? CHRONIC KIDNEY DISEASE STAGE II (MILD) 09/14/2009  ? Hypothyroidism 08/31/2006  ? Type 2 diabetes mellitus ([place]) 08/31/2006  ? HYPERCHOLESTEROLEMIA 08/31/2006  ? Tobacco abuse 08/31/2006  ? HYPERTENSION, BENIGN SYSTEMIC 08/31/2006  ? ? ?Past Surgical History:  ?Procedure Laterality Date  ? CESAREAN SECTION    ? FOOT FRACTURE SURGERY Right   ? "steel plate in it"  ? FOOT SURGERY    ? " born w/dislocated foot"  ? FRACTURE SURGERY    ? KNEE  ARTHROSCOPY Right   ? TOE SURGERY Bilateral   ? "both pinky toes"  ? TONSILLECTOMY AND ADENOIDECTOMY    ? ? ?OB History   ?No obstetric history on file. ?  ? ? ? ?Home Medications   ? ?Prior to Admission medications   ?Medication Sig Start Date End Date Taking? Authorizing Provider  ?sucralfate (CARAFATE) 1 GM/10ML suspension SHAKE LIQUID AND TAKE 10 ML(1 GRAM) BY MOUTH FOUR TIMES DAILY WITH MEALS AND AT BEDTIME 10/04/21   Ngetich, [name] C, NP  ?albuterol (VENTOLIN HFA) 108 (90 Base) MCG/ACT inhaler Inhale 2 puffs into the lungs 4 (four) times daily as needed for wheezing or shortness of breath. 09/10/21 12/09/21  Ngetich, [name] C, NP  ?amLODipine (NORVASC) 5 MG tablet Take 1 tablet (5 mg total) by mouth daily. 05/06/21   Clapacs, Madie Reno, MD  ?aspirin EC 81 MG EC tablet Take 1 tablet (81 mg total) by mouth daily. Swallow whole. 05/07/21   Clapacs, Madie Reno, MD  ?Blood Glucose Monitoring Suppl (FREESTYLE LITE) w/Device KIT 1 each by Does not apply route daily. Dx:E11.9 11/04/20   Ngetich, Nelda Bucks, NP  ?Blood Pressure Monitoring (BLOOD PRESSURE MONITOR/WRIST) KIT 1 Units by Does not apply route daily at 6 (six) AM. 05/26/20   Ngetich, [name] C, NP  ?divalproex (DEPAKOTE) 250 MG DR tablet Take 3 tablets (750 mg total) by mouth every 12 (twelve) hours. ?Patient not taking: Reported on 10/18/2021 05/06/21   Clapacs, Madie Reno, MD  ?fluconazole (DIFLUCAN) 150 MG tablet Take one tablet by mouth  x 1 dose then repeat x 1 dose in one week 07/30/21   Ngetich, [name] C, NP  ?fluticasone (FLONASE) 50 MCG/ACT nasal spray Place 2 sprays into both nostrils daily. 10/18/21   Eulogio Bear, NP  ?glucose blood (ACCU-CHEK GUIDE) test strip USE TO TEST BLOOD SUGAR ONCE DAILY 03/29/21   Ngetich, [name] C, NP  ?glucose blood (FREESTYLE LITE) test strip Use to test blood sugar one daily. Dx: E11.9 11/04/20   Ngetich, [name] C, NP  ?glucose blood (ONETOUCH VERIO) test strip Use to test blood sugar 2-3 times daily. Dx: E11.9 06/08/21   Ngetich, [name] C, NP   ?glucose blood test strip One Touch Ultra 2 Test Strips Use to test blood sugar once daily. Dx: E11.9 08/25/20   Ngetich, [name] C, NP  ?glucose blood test strip Use 2-3 times daily 04/07/21   Ngetich, [name] C, NP  ?hydrochlorothiazide (HYDRODIURIL) 25 MG tablet Take 1 tablet (25 mg total) by mouth daily. 05/07/21   Clapacs, Madie Reno, MD  ?ibuprofen (ADVIL) 800 MG tablet TAKE 1 TABLET(800 MG) BY MOUTH EVERY 8 HOURS AS NEEDED FOR MODERATE PAIN 08/25/21   Suzan Slick, NP  ?Lancets (FREESTYLE) lancets Use to check blood sugar once daily. Dx: E11.9 11/04/20   Ngetich, [name] C, NP  ?levothyroxine (SYNTHROID) 25 MCG tablet  Take 1 tablet (25 mcg total) by mouth daily before breakfast. 05/07/21   Clapacs, Madie Reno, MD  ?linagliptin (TRADJENTA) 5 MG TABS tablet Take 1 tablet (5 mg total) by mouth daily. 05/17/21   Lauree Chandler, NP  ?metFORMIN (GLUCOPHAGE) 1000 MG tablet Take 1 tablet (1,000 mg total) by mouth 2 (two) times daily with a meal. 05/06/21   Clapacs, Madie Reno, MD  ?methocarbamol (ROBAXIN) 500 MG tablet Take 1 tablet (500 mg total) by mouth 3 (three) times daily as needed for muscle spasms. 05/22/21   Magnant, Charles L, PA-C  ?metoprolol succinate (TOPROL-XL) 50 MG 24 hr tablet Take 1 tablet (50 mg total) by mouth daily. 09/10/21   Ngetich, [name] C, NP  ?montelukast (SINGULAIR) 10 MG tablet Take 1 tablet (10 mg total) by mouth at bedtime. 09/19/21   Fargo, Amy E, NP  ?nitrofurantoin (MACRODANTIN) 50 MG capsule Take 50 mg by mouth daily. 10/12/21   [provider]  ?nitroGLYCERIN (NITROSTAT) 0.4 MG SL tablet Place under the tongue as needed. 06/01/21   [provider]  ?pantoprazole (PROTONIX) 40 MG tablet Take 1 tablet (40 mg total) by mouth daily. 05/07/21   Clapacs, Madie Reno, MD  ?potassium chloride (KLOR-CON) 10 MEQ tablet Take 1 tablet (10 mEq total) by mouth 2 (two) times daily. 05/06/21   Clapacs, Madie Reno, MD  ?rosuvastatin (CRESTOR) 40 MG tablet Take 1 tablet (40 mg total) by mouth daily. 05/06/21    Clapacs, Madie Reno, MD  ? ? ?Family History ?Family History  ?Problem Relation Age of Onset  ? Breast cancer Sister   ? Heart disease Mother 62  ? Rectal cancer Mother   ? Diabetes Father 65  ? Hypertension [place]

## 2021-10-20 LAB — URINE CULTURE: Culture: <10000 — AB

## 2021-10-21 ENCOUNTER — Telehealth: Payer: Self-pay | Admitting: Internal Medicine

## 2021-10-26 ENCOUNTER — Other Ambulatory Visit: Payer: Self-pay | Admitting: Orthopedic Surgery

## 2021-10-27 ENCOUNTER — Telehealth: Payer: Self-pay

## 2021-10-27 ENCOUNTER — Other Ambulatory Visit: Payer: Self-pay | Admitting: Orthopedic Surgery

## 2021-10-27 MED ORDER — PREDNISONE 10 MG PO TABS: 10.0000 mg | ORAL_TABLET | Freq: Every day | ORAL | 0 refills | Status: DC

## 2021-10-27 MED ORDER — METHOCARBAMOL 500 MG PO TABS: 500.0000 mg | ORAL_TABLET | Freq: Three times a day (TID) | ORAL | 2 refills | Status: DC | PRN

## 2021-10-27 NOTE — Telephone Encounter (Signed)
Please advise 

## 2021-10-27 NOTE — Telephone Encounter (Signed)
Patient called into the office stating that she would like some prednisone and robaxin sent in. She stated that she has a touch of phenomena and that is why she cant come into the office   ?

## 2021-10-27 NOTE — Telephone Encounter (Signed)
Noted  

## 2021-10-28 ENCOUNTER — Ambulatory Visit: Payer: Medicare Other | Admitting: Family

## 2021-11-01 ENCOUNTER — Emergency Department (HOSPITAL_COMMUNITY)
Admission: EM | Admit: 2021-11-01 | Discharge: 2021-11-01 | Disposition: A | Discharge status: Home / Self Care | Payer: Medicare Other | Attending: Emergency Medicine | Admitting: Emergency Medicine

## 2021-11-01 DIAGNOSIS — X58XXXA Exposure to other specified factors, initial encounter: Secondary | ICD-10-CM | POA: Diagnosis not present

## 2021-11-01 DIAGNOSIS — S0990XA Unspecified injury of head, initial encounter: Secondary | ICD-10-CM | POA: Diagnosis present

## 2021-11-01 DIAGNOSIS — Z5321 Procedure and treatment not carried out due to patient leaving prior to being seen by health care provider: Secondary | ICD-10-CM | POA: Insufficient documentation

## 2021-11-01 DIAGNOSIS — S060X0A Concussion without loss of consciousness, initial encounter: Secondary | ICD-10-CM | POA: Insufficient documentation

## 2021-11-01 DIAGNOSIS — R1013 Epigastric pain: Secondary | ICD-10-CM | POA: Diagnosis not present

## 2021-11-01 NOTE — ED Notes (Signed)
Called patient several times for a room in the back and patient didn't answer times 4  ?
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
## 2021-11-01 NOTE — ED Provider Triage Note (Signed)
Emergency Medicine Provider Triage Evaluation Note ? ?[name] Long , a 71 y.o. female  was evaluated in triage.  Pt complains of not being able to get her cortisone shots for the last 3 years from her doctor.  Patient states that she is currently seeing Dr. Sharol Given, an orthopedic surgeon, for her concussions.  The patient states that for the last 3 years Dr. Sharol Given has refused to give her cortisone injections because his "nurses are letting other people use my insurance and giving them my cortisone shots".  The patient tells a long winding story about how her doctor sent her over here to receive a CAT scan and ultrasound.  Patient reports she had a concussion in July.  Patient also states that she needs an ultrasound of her stomach and a CAT scan of her hiatal hernia.  The patient is unable to tell me why exactly she is here and what her goal is.  The patient states that she "just needs a CAT scan, do not waste my time". ? ?Review of Systems  ?Positive:  ?Negative:  ? ?Physical Exam  ?BP (!) 156/82 (BP Location: Left Arm)   Pulse 86   Temp 98 ?F (36.7 ?C)   Resp 17   SpO2 100%  ?Gen:   Awake, no distress   ?Resp:  Normal effort  ?MSK:   Moves extremities without difficulty  ?Other:  Patient alert and orient x4.  Vital signs reassuring.  ? ?Medical Decision Making  ?Medically screening exam initiated at 12:34 PM.  Appropriate orders placed.  [name] Long was informed that the remainder of the evaluation will be completed by another provider, this initial triage assessment does not replace that evaluation, and the importance of remaining in the ED until their evaluation is complete. ? ? ?  ?Azucena Cecil, PA-C ?11/01/21 1236 ? ?

## 2021-11-02 ENCOUNTER — Other Ambulatory Visit: Payer: Self-pay | Admitting: Cardiovascular Disease

## 2021-11-02 ENCOUNTER — Ambulatory Visit
Admission: RE | Admit: 2021-11-02 | Discharge: 2021-11-02 | Disposition: A | Discharge status: Home / Self Care | Payer: Medicare Other | Source: Ambulatory Visit | Attending: Cardiovascular Disease | Admitting: Cardiovascular Disease

## 2021-11-02 DIAGNOSIS — R059 Cough, unspecified: Secondary | ICD-10-CM | POA: Diagnosis not present

## 2021-11-04 ENCOUNTER — Ambulatory Visit: Payer: Medicare Other | Admitting: Family

## 2021-11-05 NOTE — Telephone Encounter (Signed)
Patient called in and would like and injection in her knees being they are giving out and would like it sent in to the insurance company to see if they will approve it. ?

## 2021-11-05 NOTE — Telephone Encounter (Signed)
Yes, that is correct.  Her next available gel injection would be after 03/12/2022. ?

## 2021-11-05 NOTE — Telephone Encounter (Signed)
Pt is stating that the information I am giving her about her last gel injections from 09/09/21, which is documented by Dr. Sharol Given, is wrong. She didn't get these shots. She said she was kicked out of the office because she didn't have an appt that day. She said that she wanted [place] to call Wynetta Emery and Wynetta Emery so we are aware that she is due for gel injections. I see approval letter from 07/15/21 but doesn't state any specific dates. Also that documented in chart that she was approved and can have done after 09/06/21 ?

## 2021-11-05 NOTE — Telephone Encounter (Signed)
Pt is referring to her monovisc injections. Her last one on bilateral knees was on 09/09/21. She has to wait 6 mos for approval? ?

## 2021-11-11 ENCOUNTER — Encounter: Payer: Self-pay | Admitting: Orthopedic Surgery

## 2021-11-11 ENCOUNTER — Ambulatory Visit (INDEPENDENT_AMBULATORY_CARE_PROVIDER_SITE_OTHER): Payer: Medicare Other | Admitting: Orthopedic Surgery

## 2021-11-11 ENCOUNTER — Telehealth: Payer: Self-pay

## 2021-11-11 DIAGNOSIS — M170 Bilateral primary osteoarthritis of knee: Secondary | ICD-10-CM

## 2021-11-11 DIAGNOSIS — G8929 Other chronic pain: Secondary | ICD-10-CM

## 2021-11-11 DIAGNOSIS — M545 Low back pain, unspecified: Secondary | ICD-10-CM

## 2021-11-11 MED ORDER — METHOCARBAMOL 500 MG PO TABS: 500.0000 mg | ORAL_TABLET | Freq: Three times a day (TID) | ORAL | 0 refills | Status: DC | PRN

## 2021-11-11 NOTE — Telephone Encounter (Signed)
This pt will be eligable  for monovisc injections in September bilateral knees for OA.and wanted me to let you now she would like to have them at that time.  ?

## 2021-11-11 NOTE — Telephone Encounter (Signed)
Noted  

## 2021-11-11 NOTE — Progress Notes (Signed)
? ?Office Visit Note ?  ?Patient: [name] Long           ?Date of Birth: 1951/06/22           ?MRN: [id number] ?Visit Date: 11/11/2021 ?             ?Requested by: Sandrea Hughs, NP ?[address] ?[place],  [place] [address] ?PCP: Ngetich, Nelda Bucks, NP ? ?Chief Complaint  ?Patient presents with  ? Middle Back - Pain  ? Lower Back - Pain  ? ? ? ? ?HPI: ?Patient is a 71 year old woman who presents complaining of chronic neck thoracic and lumbar spine pain.  Patient states she has had no recent falls.  Patient states the pain is primarily on the left side of her back.  She has used nonsteroidals.  She also has chronic knee pain and swelling.  Patient states she has difficulty getting out of bed. ? ?Assessment & Plan: ?Visit Diagnoses:  ?1. Bilateral primary osteoarthritis of knee   ?2. Chronic bilateral low back pain without sciatica   ? ? ?Plan: Patient is sent a prescription for Robaxin to help with her muscle spasms.  She will continue with ibuprofen for pain.  Patient would like further hyaluronic acid injections in her knees when time is available. ? ?Follow-Up Instructions: Return if symptoms worsen or fail to improve.  ? ?Ortho Exam ? ?Patient is alert, oriented, no adenopathy, well-dressed, normal affect, normal respiratory effort. ?Examination patient has no focal motor weakness in either lower extremity.  Negative straight leg raise.  There is no effusion of either knee.  She has no venous stasis ulcers. ? ?Imaging: ?No results found. ?No images are attached to the encounter. ? ?Labs: ?Lab Results  ?Component Value Date  ? HGBA1C 8.3 (H) 04/19/2021  ? HGBA1C 8.6 (H) 10/01/2020  ? HGBA1C 8.1 (H) 05/26/2020  ? REPTSTATUS 10/20/2021 FINAL 10/18/2021  ? CULT (A) 10/18/2021  ?  <10,000 COLONIES/mL INSIGNIFICANT GROWTH ?Performed at [facility], [place] [address], [place], [place] [address] ?  ? LABORGA NO GROWTH 12/04/2014  ? ? ? ?Lab Results  ?Component Value Date  ? ALBUMIN 2.8 (L) 05/07/2021  ? ALBUMIN 3.4  (L) 04/17/2021  ? ALBUMIN 3.1 (L) 09/21/2020  ? ? ?Lab Results  ?Component Value Date  ? MG 1.7 09/04/2020  ? MG 1.7 12/25/2016  ? MG 1.8 06/10/2016  ? ?Lab Results  ?Component Value Date  ? VD25OH 43.0 02/23/2018  ? VD25OH 30 05/06/2015  ? VD25OH 12 (L) 12/18/2014  ? ? ?No results found for: PREALBUMIN ? ?  Latest Ref Rng & Units 05/07/2021  ?  2:26 PM 05/07/2021  ?  2:15 PM 04/17/2021  ?  7:38 PM  ?CBC EXTENDED  ?WBC 4.0 - 10.5 K/uL  5.1   6.3    ?RBC 3.87 - 5.11 MIL/uL  3.09   3.53    ?Hemoglobin 12.0 - 15.0 g/dL 10.5   9.6   11.2    ?HCT 36.0 - 46.0 % 31.0   29.7   34.2    ?Platelets 150 - 400 K/uL  158   249    ?NEUT# 1.7 - 7.7 K/uL  3.1   2.4    ?Lymph# 0.7 - 4.0 K/uL  1.1   2.7    ? ? ? ?There is no height or weight on file to calculate BMI. ? ?Orders:  ?No orders of the defined types were placed in this encounter. ? ?Meds ordered this  encounter  ?Medications  ? methocarbamol (ROBAXIN) 500 MG tablet  ?  Sig: Take 1 tablet (500 mg total) by mouth 3 (three) times daily as needed for muscle spasms.  ?  Dispense:  30 tablet  ?  Refill:  0  ? ? ? Procedures: ?No procedures performed ? ?Clinical Data: ?No additional findings. ? ?ROS: ? ?All other systems negative, except as noted in the HPI. ?Review of Systems ? ?Objective: ?Vital Signs: There were no vitals taken for this visit. ? ?Specialty Comments:  ?No specialty comments available. ? ?PMFS History: ?Patient Active Problem List  ? Diagnosis Date Noted  ? Bipolar I disorder, most recent episode depressed, severe w psychosis ([place])   ? Dementia associated with other underlying disease with behavioral disturbance ([place]) 03/27/2020  ? GERD (gastroesophageal reflux disease) 12/27/2019  ? Post-menopausal atrophic vaginitis 12/13/2019  ? Vagina, candidiasis 12/10/2019  ? Vaginal itching 11/10/2019  ? Dysphagia 07/02/2019  ? Screening examination for STD (sexually transmitted disease) 03/25/2019  ? 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 Other chronic pain 12/03/2018  ? Dizziness of  unknown cause 08/04/2018  ? Tremor 07/15/2018  ? Atrial flutter with rapid ventricular response ([place]) 07/10/2017  ? Atrial fibrillation ([place]) 07/05/2017  ? COPD (chronic obstructive pulmonary disease) ([place]) 01/22/2017  ? Pedal edema   ? Congestive heart failure (CHF) ([place])   ? Chronic bilateral low back pain without sciatica 08/15/2016  ? 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 08/15/2016  ? Schizoaffective disorder ([place])   ? Hypokalemia 01/22/2015  ? Osteoarthritis, multiple sites 06/08/2011  ? Dysuria 02/14/2011  ? CHRONIC KIDNEY DISEASE STAGE II (MILD) 09/14/2009  ? Hypothyroidism 08/31/2006  ? Type 2 diabetes mellitus ([place]) 08/31/2006  ? HYPERCHOLESTEROLEMIA 08/31/2006  ? Tobacco abuse 08/31/2006  ? HYPERTENSION, BENIGN SYSTEMIC 08/31/2006  ? ?Past Medical History:  ?Diagnosis Date  ? Abscessed tooth 02/09/2020  ? Acute on chronic congestive heart failure ([place])   ? Adenomatous colon polyp   ? Arthritis   ? "real bad; all over" (07/12/2017)  ? Asthma   ? Atrial fibrillation ([place])   ? BIPOLAR DISORDER 08/31/2006  ? Qualifier: Diagnosis of  By: Dorathy Daft MD, Marjory Lies    ? CHRONIC KIDNEY DISEASE STAGE II (MILD) 09/14/2009  ? Annotation: eGFR 64 Qualifier: Diagnosis of  By: Jess Barters MD, [name]    ? Chronic lower back pain   ? Congestive heart failure (CHF) ([place])   ? COPD (chronic obstructive pulmonary disease) ([place]) 09/23/2010  ? Diagnosed at [facility] in 2008 (Dr. Annamaria Boots)   ? DM (diabetes mellitus) type II controlled with renal manifestation ([place]) 08/31/2006  ? Qualifier: Diagnosis of  By: Dorathy Daft MD, Marjory Lies    ? Dyspnea   ? "all my life; since 6th grade" (07/12/2017)  ? Fatty liver   ? HYPERCHOLESTEROLEMIA 08/31/2006  ? Intolerance to Lipitor OK on Crestor but medicaid no longer covering    ? HYPERTENSION, BENIGN SYSTEMIC 08/31/2006  ? Qualifier: Diagnosis of  By: Dorathy Daft MD, Marjory Lies    ? Hypokalemia   ? HYPOTHYROIDISM, UNSPECIFIED 08/31/2006  ? Qualifier: Diagnosis of  By: Dorathy Daft MD,  Marjory Lies    ? Leg swelling 03/14/2018  ? Pneumonia   ? "3 times" (07/12/2017)  ? Pulmonary nodule   ? Renal cyst   ? Schizophrenia ([place])   ? Scoliosis   ? Stomach problems   ? Thyroid disorder   ?  ?Family History  ?Problem Relation Age of Onset  ? Breast cancer Sister   ?  Heart disease Mother 43  ? Rectal cancer Mother   ? Diabetes Father 21  ? Hypertension Father   ? Heart disease Brother   ? Asthma Daughter   ? Asthma Son   ? Breast cancer Sister   ? Asthma Daughter   ? Colon cancer Maternal Grandmother   ? Stomach cancer Neg Hx   ? Esophageal cancer Neg Hx   ? Pancreatic cancer Neg Hx   ?  ?Past Surgical History:  ?Procedure Laterality Date  ? CESAREAN SECTION    ? FOOT FRACTURE SURGERY Right   ? "steel plate in it"  ? FOOT SURGERY    ? " born w/dislocated foot"  ? FRACTURE SURGERY    ? KNEE ARTHROSCOPY Right   ? TOE SURGERY Bilateral   ? "both pinky toes"  ? TONSILLECTOMY AND ADENOIDECTOMY    ? ?Social History  ? ?Occupational History  ? Not on file  ?Tobacco Use  ? Smoking status: Every Day  ?  Packs/day: 0.25  ?  Years: 45.00  ?  Pack years: 11.25  ?  Types: Cigarettes  ? Smokeless tobacco: Never  ? Tobacco comments:  ?  patient states she smokes 1 cigarette per month  ?Vaping Use  ? Vaping Use: Never used  ?Substance and Sexual Activity  ? Alcohol use: Not Currently  ?  Comment: 07/12/2017 "stopped 2 yr ago; did have a drink over the holidays recently"  ? Drug use: No  ? Sexual activity: Not Currently  ? ? ? ? ? ?

## 2021-11-12 ENCOUNTER — Telehealth: Payer: Self-pay | Admitting: Orthopedic Surgery

## 2021-11-12 ENCOUNTER — Ambulatory Visit: Payer: Medicare Other | Admitting: Family
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
## 2021-11-12 NOTE — Telephone Encounter (Signed)
Please see below.

## 2021-11-12 NOTE — Telephone Encounter (Signed)
I have tried to call patient to discuss. No answer. LMVM for her to call me back should she wish to further discuss her concerns  ?

## 2021-11-12 NOTE — Telephone Encounter (Signed)
Will submit in August, 2023 for Monovisc, bilateral knee. ?

## 2021-11-20 ENCOUNTER — Telehealth: Payer: Self-pay | Admitting: Family Medicine

## 2021-11-20 ENCOUNTER — Other Ambulatory Visit: Payer: Self-pay | Admitting: Family Medicine

## 2021-11-20 DIAGNOSIS — E1142 Type 2 diabetes mellitus with diabetic polyneuropathy: Secondary | ICD-10-CM

## 2021-11-20 MED ORDER — ONETOUCH VERIO VI STRP: ORAL_STRIP | 11 refills | Status: DC

## 2021-11-20 NOTE — Telephone Encounter (Signed)
Received call from OOH line regarding refill for glucose test strips. Patient reports that she has run out of her glucose test strips and her morning fasting CBG 500 at 5am which have been concerning her.  Her CBGs are usually labile and varying between 90-300s.  Per chart review her last A1c 8.3 7 months ago.  Of note patient has not been seen at [facility] clinic since 2021 due to a prolonged illness which we did not discuss today, however she has a follow-up at [facility] in 2 days.  Last night she had chicken and mashed potatoes. She is on a diabetic diet and reports chronic GI issues. Pt reports feeling dizzy due to not eating breakfast. Denies nausea, vomiting. Does have chronic GI issues and blurred vision.  She reports compliance with her diabetic medications which include include Metformin, farxiga , tradjenta (she has been waiting for this medication from the pharmacy for the last 3-4 weeks).  Patient lives with her 2 young grandchildren, her son lives nearby.  She will get a cab to get her test strips today she does not have transportation.  Provided patient refill for glucose test strips.  Recommended that if her blood sugars are persistently over 500 today she should come to the ED for further evaluation if she will need IV insulin and IV fluids.  Safety precautions provided to patient who expressed understanding. Will forward to Dr Harrie Foreman who will be seeing patient on Monday, 22nd May.  Patient will need an A1c at this time and BMP.  Lattie Haw MD PGY-3, Family Medicine

## 2021-11-20 NOTE — Telephone Encounter (Signed)
Pt calls OOH line again as wrong prescription was sent in. Will call her pharmacy and place verbal orders.

## 2021-11-22 ENCOUNTER — Telehealth: Payer: Self-pay | Admitting: *Deleted

## 2021-11-22 ENCOUNTER — Encounter: Payer: Self-pay | Admitting: Student

## 2021-11-22 ENCOUNTER — Ambulatory Visit: Payer: Medicare Other | Admitting: Student

## 2021-11-22 NOTE — Telephone Encounter (Signed)
Patient arrived for New patient appointment today ( was patient previously in 2021, but seems she chose to go elsewhere but wanted to return to [facility]).  Patient has legal guardian ( Cassandra - see media from 12/12/16) who did not accompany her to appt today.  When advised that she could not be seen without her Guardian pt became upset, but did give me the number to reach her daughter/guardian ( Dr. Gwendlyn Deutscher was witness to this conversation).  Spoke with [name], she is unable to come today but can make an appt on Thursday.  We made new patient appt on Thursday @ 2:45.  Advised pt of plan which made her even more upset.  She wants to know if she can get Cassandra to come can she be seen.  She attempted to reach Cassandra until 11:45am ( 30 minutes past appt) but was unable to reach her.  When I advised that that provider would not be able to see her, she because even more angry.  She did state that she would call the board and have me "fired and would take all your money".  Although disrespectful, no other threats were made.  She was overheard telling an unverified person on the phone that I "told her to get the H### out".  Otherwise waited until her ride came and left the building.  Upon review of chart, there had been 2 incidents of her inappropriate behavior (threatening staff / disrespectful behavior) . See Phone notes from 03/13/20 and 04/16/20. Attempts to reach her by phone to discuss behavior were unsuccessful, but patient left shortly ( of own will) after that, so no discussion was had.  If she and Cassandra come to appt on Thursday Dr. Gwendlyn Deutscher and I will have a conversation with patient about the expectations about behavior in the office.  If she threatens or Disrespect another staff, it will lead to dismissal from practice.  Christen Bame, CMA

## 2021-11-22 NOTE — Progress Notes (Deleted)
Subjective:  Patient ID: [name] Long, female    DOB: [birth year], 71 y.o.   MRN: [id number]  CC: New Patient  HPI:  [name] Long is a very pleasant 71 y.o. female who presents today to establish care. She is a returning patient.    PMHx: Past Medical History:  Diagnosis Date   Abscessed tooth 02/09/2020   Acute on chronic congestive heart failure ([place])    Adenomatous colon polyp    Arthritis    "real bad; all over" (07/12/2017)   Asthma    Atrial fibrillation ([place])    BIPOLAR DISORDER 08/31/2006   Qualifier: Diagnosis of  By: Dorathy Daft MD, Marjory Lies     CHRONIC KIDNEY DISEASE STAGE II (MILD) 09/14/2009   Annotation: eGFR 69 Qualifier: Diagnosis of  By: Jess Barters MD, [name]     Chronic lower back pain    Congestive heart failure (CHF) (HCC)    COPD (chronic obstructive pulmonary disease) ([place]) 09/23/2010   Diagnosed at [facility] in 2008 (Dr. Annamaria Boots)    DM (diabetes mellitus) type II controlled with renal manifestation ([place]) 08/31/2006   Qualifier: Diagnosis of  By: Dorathy Daft MD, Marjory Lies     Dyspnea    "all my life; since 6th grade" (07/12/2017)   Fatty liver    HYPERCHOLESTEROLEMIA 08/31/2006   Intolerance to Lipitor OK on Crestor but medicaid no longer covering     HYPERTENSION, BENIGN SYSTEMIC 08/31/2006   Qualifier: Diagnosis of  By: Dorathy Daft MD, Aaron     Hypokalemia    HYPOTHYROIDISM, UNSPECIFIED 08/31/2006   Qualifier: Diagnosis of  By: Dorathy Daft MD, Marjory Lies     Leg swelling 03/14/2018   Pneumonia    "3 times" (07/12/2017)   Pulmonary nodule    Renal cyst    Schizophrenia ([place])    Scoliosis    Stomach problems    Thyroid disorder     Surgical Hx: Past Surgical History:  Procedure Laterality Date   CESAREAN SECTION     FOOT FRACTURE SURGERY Right    "steel plate in it"   FOOT SURGERY     " born w/dislocated foot"   FRACTURE SURGERY     KNEE ARTHROSCOPY Right    TOE SURGERY Bilateral    "both pinky toes"   TONSILLECTOMY AND ADENOIDECTOMY      Family Hx: Family  History  Problem Relation Age of Onset   Breast cancer Sister    Heart disease Mother 76   Rectal cancer Mother    Diabetes Father 59   Hypertension Father    Heart disease Brother    Asthma Daughter    Asthma Son    Breast cancer Sister    Asthma Daughter    Colon cancer Maternal Grandmother    Stomach cancer Neg Hx    Esophageal cancer Neg Hx    Pancreatic cancer Neg Hx     Social Hx: Current Social History   (Please include date ( .td) when updating information )  Who lives at home: self 11/22/2021  Who would speak for you about health care matters: guardian/Cassandra Odeh 11/22/2021  Transportation: guardian/daughter [name] [place]) 11/22/2021 Important Relationships & Pets: *** 11/22/2021  Current Stressors: *** 11/22/2021 Work / Education:  *** 11/22/2021 Religious / Personal Beliefs: N/A 11/22/2021 Interests / Fun: listen to music 11/22/2021 Other: *** 11/22/2021   Medications:   ROS: Woman:  Patient reports no  vision/ hearing changes,anorexia, weight change, fever ,adenopathy, persistant / recurrent hoarseness, swallowing issues, chest pain, edema,persistant / recurrent cough, hemoptysis,  dyspnea(rest, exertional, paroxysmal nocturnal), gastrointestinal  bleeding (melena, rectal bleeding), abdominal pain, excessive heart burn, GU symptoms(dysuria, hematuria, pyuria, voiding/incontinence  Issues) syncope, focal weakness, severe memory loss, concerning skin lesions, depression, anxiety, abnormal bruising/bleeding, major joint swelling, breast masses or abnormal vaginal bleeding.    Man:  Patient reports no  vision/ hearing changes,anorexia, weight change, fever ,adenopathy, persistant / recurrent hoarseness, swallowing issues, chest pain, edema,persistant / recurrent cough, hemoptysis, dyspnea(rest, exertional, paroxysmal nocturnal), gastrointestinal  bleeding (melena, rectal bleeding), abdominal pain, excessive heart burn, GU symptoms(dysuria, hematuria, pyuria,  voiding/incontinence  Issues) syncope, focal weakness, severe memory loss, concerning skin lesions, depression, anxiety, abnormal bruising/bleeding, major joint swelling.    Preventative Screening Colonoscopy: year*** results *** Mammogram: year*** results *** Pap test: year*** results *** PSA: year*** results *** DEXA: year*** results *** Tetanus vaccine: year*** results *** Pneumonia vaccine: year*** results *** Shingles vaccine: year*** results *** Heart stress test: year*** results *** Echocardiogram: year*** results *** Xrays: year*** results *** CT/MRI: year*** results ***  Smoking status reviewed  ROS: pertinent noted in the HPI    Objective:  There were no vitals taken for this visit. Vitals and nursing note reviewed  General: NAD, pleasant, able to participate in exam HEENT: normocephalic, TM's visualized bilaterally, no scleral icterus or conjunctival pallor, no nasal discharge, moist mucous membranes, good dentition without erythema or discharge noted in posterior oropharynx Neck: supple, non-tender, without lymphadenopathy Cardiac: RRR, S1 S2 present. normal heart sounds, no murmurs. Respiratory: CTAB, normal effort, No wheezes, rales or rhonchi Abdomen: Normoactive bowel sounds, non-tender, non-distended, no hepatosplenomegaly Extremities: no edema or cyanosis. Skin: warm and dry, no rashes noted Neuro: alert, no obvious focal deficits Psych: Normal affect and mood  Assessment & Plan:  No problem-specific Assessment & Plan notes found for this encounter.   No orders of the defined types were placed in this encounter.  No orders of the defined types were placed in this encounter.  No follow-ups on file. Fenton Malling Nicole Kindred) [place], DO 11/22/2021, 10:22 AM PGY-***, [place]

## 2021-11-25 ENCOUNTER — Ambulatory Visit: Payer: Medicare Other | Admitting: Student

## 2021-11-25 NOTE — Progress Notes (Unsigned)
 Subjective:  Patient ID: [name] Long, female    DOB: 09/25/1950, 71 y.o.   MRN: [id number]  CC: New Patient  HPI:  [name] Long is a very pleasant 71 y.o. female who presents today to establish care.   PMHx: Past Medical History:  Diagnosis Date   Abscessed tooth 02/09/2020   Acute on chronic congestive heart failure (HCC)    Adenomatous colon polyp    Arthritis    "real bad; all over" (07/12/2017)   Asthma    Atrial fibrillation (HCC)    BIPOLAR DISORDER 08/31/2006   Qualifier: Diagnosis of  By: Leininger MD, Aaron     CHRONIC KIDNEY DISEASE STAGE II (MILD) 09/14/2009   Annotation: eGFR 64 Qualifier: Diagnosis of  By: Ritch MD, Erik     Chronic lower back pain    Congestive heart failure (CHF) (HCC)    COPD (chronic obstructive pulmonary disease) (HCC) 09/23/2010   Diagnosed at [facility] in 2008 (Dr. Young)    DM (diabetes mellitus) type II controlled with renal manifestation (HCC) 08/31/2006   Qualifier: Diagnosis of  By: Leininger MD, Aaron     Dyspnea    "all my life; since 6th grade" (07/12/2017)   Fatty liver    HYPERCHOLESTEROLEMIA 08/31/2006   Intolerance to Lipitor OK on Crestor but medicaid no longer covering     HYPERTENSION, BENIGN SYSTEMIC 08/31/2006   Qualifier: Diagnosis of  By: Leininger MD, Aaron     Hypokalemia    HYPOTHYROIDISM, UNSPECIFIED 08/31/2006   Qualifier: Diagnosis of  By: Leininger MD, Aaron     Leg swelling 03/14/2018   Pneumonia    "3 times" (07/12/2017)   Pulmonary nodule    Renal cyst    Schizophrenia (HCC)    Scoliosis    Stomach problems    Thyroid disorder     Surgical Hx: Past Surgical History:  Procedure Laterality Date   CESAREAN SECTION     FOOT FRACTURE SURGERY Right    "steel plate in it"   FOOT SURGERY     " born w/dislocated foot"   FRACTURE SURGERY     KNEE ARTHROSCOPY Right    TOE SURGERY Bilateral    "both pinky toes"   TONSILLECTOMY AND ADENOIDECTOMY      Family Hx: Family History  Problem Relation Age of  Onset   Heart disease Mother 72   Rectal cancer Mother    Diabetes Mother    High Cholesterol Mother    Hypertension Mother    Stroke Mother    Diabetes Father 91   Hypertension Father    Breast cancer Sister    High Cholesterol Sister    Hypertension Sister    Breast cancer Sister    Heart disease Brother    High Cholesterol Brother    Hypertension Brother    Colon cancer Maternal Grandmother    Asthma Daughter    Asthma Daughter    Asthma Son    Stomach cancer Neg Hx    Esophageal cancer Neg Hx    Pancreatic cancer Neg Hx     Social Hx: Current Social History   (Please include date ( .td) when updating information )  Who lives at home: *** 11/25/2021  Who would speak for you about health care matters: *** 11/25/2021  Transportation: *** 11/25/2021 Important Relationships & Pets: *** 11/25/2021  Current Stressors: *** 11/25/2021 Work / Education:  *** 11/25/2021 Religious / Personal Beliefs: *** 11/25/2021 Interests / Fun: *** 11/25/2021 Other: *** 11/25/2021     Medications:   ROS: Woman:  Patient reports no  vision/ hearing changes,anorexia, weight change, fever ,adenopathy, persistant / recurrent hoarseness, swallowing issues, chest pain, edema,persistant / recurrent cough, hemoptysis, dyspnea(rest, exertional, paroxysmal nocturnal), gastrointestinal  bleeding (melena, rectal bleeding), abdominal pain, excessive heart burn, GU symptoms(dysuria, hematuria, pyuria, voiding/incontinence  Issues) syncope, focal weakness, severe memory loss, concerning skin lesions, depression, anxiety, abnormal bruising/bleeding, major joint swelling, breast masses or abnormal vaginal bleeding.    Man:  Patient reports no  vision/ hearing changes,anorexia, weight change, fever ,adenopathy, persistant / recurrent hoarseness, swallowing issues, chest pain, edema,persistant / recurrent cough, hemoptysis, dyspnea(rest, exertional, paroxysmal nocturnal), gastrointestinal  bleeding (melena, rectal  bleeding), abdominal pain, excessive heart burn, GU symptoms(dysuria, hematuria, pyuria, voiding/incontinence  Issues) syncope, focal weakness, severe memory loss, concerning skin lesions, depression, anxiety, abnormal bruising/bleeding, major joint swelling.    Preventative Screening Colonoscopy: year*** results *** Mammogram: year*** results *** Pap test: year*** results *** PSA: year*** results *** DEXA: year*** results *** Tetanus vaccine: year*** results *** Pneumonia vaccine: year*** results *** Shingles vaccine: year*** results *** Heart stress test: year*** results *** Echocardiogram: year*** results *** Xrays: year*** results *** CT/MRI: year*** results ***  Smoking status reviewed  ROS: pertinent noted in the HPI    Objective:  There were no vitals taken for this visit. Vitals and nursing note reviewed  General: NAD, pleasant, able to participate in exam HEENT: normocephalic, TM's visualized bilaterally, no scleral icterus or conjunctival pallor, no nasal discharge, moist mucous membranes, good dentition without erythema or discharge noted in posterior oropharynx Neck: supple, non-tender, without lymphadenopathy Cardiac: RRR, S1 S2 present. normal heart sounds, no murmurs. Respiratory: CTAB, normal effort, No wheezes, rales or rhonchi Abdomen: Normoactive bowel sounds, non-tender, non-distended, no hepatosplenomegaly Extremities: no edema or cyanosis. Skin: warm and dry, no rashes noted Neuro: alert, no obvious focal deficits Psych: Normal affect and mood  Assessment & Plan:  No problem-specific Assessment & Plan notes found for this encounter.   No orders of the defined types were placed in this encounter.  No orders of the defined types were placed in this encounter.  No follow-ups on file.  , DO 11/25/2021, 1:35 PM PGY-***, [facility] 

## 2021-12-01 ENCOUNTER — Telehealth: Payer: Self-pay | Admitting: Orthopedic Surgery

## 2021-12-01 NOTE — Telephone Encounter (Signed)
Pt called and was wondering if you can send a letter to the social security office that "she is capable of managing her own check. "  Pt Social: [phone] FAX [phone] in [place].N.B  Also she would like to know if she can have a refill on 800 mg Ib

## 2021-12-02 ENCOUNTER — Other Ambulatory Visit: Payer: Self-pay | Admitting: Nurse Practitioner

## 2021-12-02 NOTE — Telephone Encounter (Signed)
This should come from PCP not ortho

## 2021-12-08 ENCOUNTER — Encounter (HOSPITAL_COMMUNITY): Payer: Self-pay | Admitting: Emergency Medicine

## 2021-12-08 ENCOUNTER — Ambulatory Visit (HOSPITAL_COMMUNITY)
Admission: EM | Admit: 2021-12-08 | Discharge: 2021-12-08 | Disposition: A | Discharge status: Home / Self Care | Payer: Medicare Other | Attending: Internal Medicine | Admitting: Internal Medicine

## 2021-12-08 DIAGNOSIS — N898 Other specified noninflammatory disorders of vagina: Secondary | ICD-10-CM | POA: Insufficient documentation

## 2021-12-08 DIAGNOSIS — Z113 Encounter for screening for infections with a predominantly sexual mode of transmission: Secondary | ICD-10-CM | POA: Diagnosis not present

## 2021-12-08 LAB — HIV ANTIBODY (ROUTINE TESTING W REFLEX): HIV Screen 4th Generation wRfx: NONREACTIVE

## 2021-12-08 NOTE — ED Provider Notes (Signed)
[place]    CSN: [id number] Arrival date & time: 12/08/21  0831      History   Chief Complaint Chief Complaint  Patient presents with   Vaginal Itching    HPI [name] Long is a 71 y.o. female.   Vaginal Discharge Discharge started few days ago Discharge appears thicker, but when used q-tip for swab, saw a little blood She endorses pruritis She denies abnormal vaginal bleeding, dysuria, hematuria, pelvic pain, nausea, vomiting, fevers She would like to be tested for STDs No LMP recorded. Patient is postmenopausal.  She does not douche. Desires HIV/RPR: yes     Past Medical History:  Diagnosis Date   Abscessed tooth 02/09/2020   Acute on chronic congestive heart failure ([place])    Adenomatous colon polyp    Arthritis    "real bad; all over" (07/12/2017)   Asthma    Atrial fibrillation ([place])    BIPOLAR DISORDER 08/31/2006   Qualifier: Diagnosis of  By: Dorathy Daft MD, Marjory Lies     CHRONIC KIDNEY DISEASE STAGE II (MILD) 09/14/2009   Annotation: eGFR 68 Qualifier: Diagnosis of  By: Jess Barters MD, [name]     Chronic lower back pain    Congestive heart failure (CHF) ([place])    COPD (chronic obstructive pulmonary disease) ([place]) 09/23/2010   Diagnosed at [facility] in 2008 (Dr. Annamaria Boots)    DM (diabetes mellitus) type II controlled with renal manifestation ([place]) 08/31/2006   Qualifier: Diagnosis of  By: Dorathy Daft MD, Marjory Lies     Dyspnea    "all my life; since 6th grade" (07/12/2017)   Fatty liver    HYPERCHOLESTEROLEMIA 08/31/2006   Intolerance to Lipitor OK on Crestor but medicaid no longer covering     HYPERTENSION, BENIGN SYSTEMIC 08/31/2006   Qualifier: Diagnosis of  By: Dorathy Daft MD, Aaron     Hypokalemia    HYPOTHYROIDISM, UNSPECIFIED 08/31/2006   Qualifier: Diagnosis of  By: Dorathy Daft MD, Marjory Lies     Leg swelling 03/14/2018   Pneumonia    "3 times" (07/12/2017)   Pulmonary nodule    Renal cyst    Schizophrenia ([place])    Scoliosis    Stomach problems    Thyroid disorder      Patient Active Problem List   Diagnosis Date Noted   Bipolar I disorder, most recent episode depressed, severe w psychosis ([place])    Dementia associated with other underlying disease with behavioral disturbance ([place]) 03/27/2020   GERD (gastroesophageal reflux disease) 12/27/2019   Post-menopausal atrophic vaginitis 12/13/2019   Vagina, candidiasis 12/10/2019   Vaginal itching 11/10/2019   Dysphagia 07/02/2019   Screening examination for STD (sexually transmitted disease) 03/25/2019   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   Other chronic pain 12/03/2018   Dizziness of unknown cause 08/04/2018   Tremor 07/15/2018   Atrial flutter with rapid ventricular response ([place]) 07/10/2017   Atrial fibrillation ([place]) 07/05/2017   COPD (chronic obstructive pulmonary disease) ([place]) 01/22/2017   Pedal edema    Congestive heart failure (CHF) ([place])    Chronic bilateral low back pain without sciatica 08/15/2016   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 08/15/2016   Schizoaffective disorder ([place])    Hypokalemia 01/22/2015   Osteoarthritis, multiple sites 06/08/2011   Dysuria 02/14/2011   CHRONIC KIDNEY DISEASE STAGE II (MILD) 09/14/2009   Hypothyroidism 08/31/2006   Type 2 diabetes mellitus ([place]) 08/31/2006   HYPERCHOLESTEROLEMIA 08/31/2006   Tobacco abuse 08/31/2006   HYPERTENSION, BENIGN SYSTEMIC 08/31/2006    Past Surgical History:  Procedure Laterality Date   CESAREAN SECTION     FOOT FRACTURE SURGERY Right    "steel plate in it"   FOOT SURGERY     " born w/dislocated foot"   FRACTURE SURGERY     KNEE ARTHROSCOPY Right    TOE SURGERY Bilateral    "both pinky toes"   TONSILLECTOMY AND ADENOIDECTOMY      OB History   No obstetric history on file.      Home Medications    Prior to Admission medications   Medication Sig Start Date End Date Taking? Authorizing Provider  sucralfate (CARAFATE) 1 GM/10ML suspension SHAKE LIQUID AND TAKE 10 ML(1 GRAM)  BY MOUTH FOUR TIMES DAILY WITH MEALS AND AT BEDTIME 10/04/21   Ngetich, [name] C, NP  albuterol (VENTOLIN HFA) 108 (90 Base) MCG/ACT inhaler Inhale 2 puffs into the lungs 4 (four) times daily as needed for wheezing or shortness of breath. 09/10/21 12/09/21  Ngetich, [name] C, NP  amLODipine (NORVASC) 5 MG tablet Take 1 tablet (5 mg total) by mouth daily. 05/06/21   Clapacs, Madie Reno, MD  aspirin EC 81 MG EC tablet Take 1 tablet (81 mg total) by mouth daily. Swallow whole. 05/07/21   Clapacs, Madie Reno, MD  Blood Glucose Monitoring Suppl (FREESTYLE LITE) w/Device KIT 1 each by Does not apply route daily. Dx:E11.9 11/04/20   Ngetich, [name] C, NP  Blood Pressure Monitoring (BLOOD PRESSURE MONITOR/WRIST) KIT 1 Units by Does not apply route daily at 6 (six) AM. 05/26/20   Ngetich, [name] C, NP  divalproex (DEPAKOTE) 250 MG DR tablet Take 3 tablets (750 mg total) by mouth every 12 (twelve) hours. Patient not taking: Reported on 10/18/2021 05/06/21   Clapacs, Madie Reno, MD  fluconazole (DIFLUCAN) 150 MG tablet Take one tablet by mouth  x 1 dose then repeat x 1 dose in one week 07/30/21   Ngetich, [name] C, NP  fluticasone (FLONASE) 50 MCG/ACT nasal spray Place 2 sprays into both nostrils daily. 10/18/21   Eulogio Bear, NP  hydrochlorothiazide (HYDRODIURIL) 25 MG tablet Take 1 tablet (25 mg total) by mouth daily. 05/07/21   Clapacs, Madie Reno, MD  ibuprofen (ADVIL) 800 MG tablet TAKE 1 TABLET(800 MG) BY MOUTH EVERY 8 HOURS AS NEEDED FOR MODERATE PAIN 10/26/21   Suzan Slick, NP  Lancets (FREESTYLE) lancets Use to check blood sugar once daily. Dx: E11.9 11/04/20   Ngetich, Nelda Bucks, NP  levothyroxine (SYNTHROID) 25 MCG tablet Take 1 tablet (25 mcg total) by mouth daily before breakfast. 05/07/21   Clapacs, Madie Reno, MD  metFORMIN (GLUCOPHAGE) 1000 MG tablet Take 1 tablet (1,000 mg total) by mouth 2 (two) times daily with a meal. 05/06/21   Clapacs, Madie Reno, MD  methocarbamol (ROBAXIN) 500 MG tablet Take 1 tablet (500 mg total) by mouth 3  (three) times daily as needed for muscle spasms. 11/11/21   Newt Minion, MD  metoprolol succinate (TOPROL-XL) 50 MG 24 hr tablet Take 1 tablet (50 mg total) by mouth daily. 09/10/21   Ngetich, [name] C, NP  montelukast (SINGULAIR) 10 MG tablet Take 1 tablet (10 mg total) by mouth at bedtime. 09/19/21   Fargo, Amy E, NP  nitrofurantoin (MACRODANTIN) 50 MG capsule Take 50 mg by mouth daily. 10/12/21   [provider]  nitroGLYCERIN (NITROSTAT) 0.4 MG SL tablet Place under the tongue as needed. 06/01/21   [provider]  pantoprazole (PROTONIX) 40 MG tablet Take 1 tablet (40 mg total) by mouth daily.  05/07/21   Clapacs, Madie Reno, MD  potassium chloride (KLOR-CON) 10 MEQ tablet Take 1 tablet (10 mEq total) by mouth 2 (two) times daily. 05/06/21   Clapacs, Madie Reno, MD  predniSONE (DELTASONE) 10 MG tablet Take 1 tablet (10 mg total) by mouth daily with breakfast. 10/27/21   Newt Minion, MD  rosuvastatin (CRESTOR) 40 MG tablet Take 1 tablet (40 mg total) by mouth daily. 05/06/21   Clapacs, Madie Reno, MD  TRADJENTA 5 MG TABS tablet TAKE 1 TABLET(5 MG) BY MOUTH DAILY 12/03/21   Ngetich, Nelda Bucks, NP    Family History Family History  Problem Relation Age of Onset   Heart disease Mother 59   Rectal cancer Mother    Diabetes Mother    High Cholesterol Mother    Hypertension Mother    Stroke Mother    Diabetes Father 28   Hypertension Father    Breast cancer Sister    High Cholesterol Sister    Hypertension Sister    Breast cancer Sister    Heart disease Brother    High Cholesterol Brother    Hypertension Brother    Colon cancer Maternal Grandmother    Asthma Daughter    Asthma Daughter    Asthma Son    Stomach cancer Neg Hx    Esophageal cancer Neg Hx    Pancreatic cancer Neg Hx     Social History Social History   Tobacco Use   Smoking status: Every Day    Packs/day: 0.25    Years: 45.00    Pack years: 11.25    Types: Cigarettes   Smokeless tobacco: Never   Tobacco  comments:    patient states she smokes 1 cigarette per month  Vaping Use   Vaping Use: Never used  Substance Use Topics   Alcohol use: Not Currently    Comment: 07/12/2017 "stopped 2 yr ago; did have a drink over the holidays recently"   Drug use: No     Allergies   Citrus, Fish allergy, Shellfish allergy, Adhesive [tape], Atorvastatin, Ibuprofen, Latex, Lipitor [atorvastatin calcium], Lisinopril, Other, Codeine, and Tramadol   Review of Systems Review of Systems  All other systems reviewed and are negative. Per HPI  Physical Exam Triage Vital Signs ED Triage Vitals  Enc Vitals Group     BP      Pulse      Resp      Temp      Temp src      SpO2      Weight      Height      Head Circumference      Peak Flow      Pain Score      Pain Loc      Pain Edu?      Excl. in [place]?    No data found.  Updated Vital Signs BP (!) 162/88 (BP Location: Left Arm)   Pulse 72   Temp 97.9 F (36.6 C) (Oral)   Resp 20   Ht '5\' 5"'  (1.651 m)   Wt 161 lb 2.5 oz (73.1 kg)   SpO2 97%   BMI 26.82 kg/m   Visual Acuity Right Eye Distance:   Left Eye Distance:   Bilateral Distance:    Right Eye Near:   Left Eye Near:    Bilateral Near:     Physical Exam Constitutional:      General: She is not in acute distress.    Appearance: Normal appearance.  She is not ill-appearing.  HENT:     Head: Normocephalic and atraumatic.  Eyes:     Conjunctiva/sclera: Conjunctivae normal.  Cardiovascular:     Rate and Rhythm: Normal rate.  Pulmonary:     Effort: Pulmonary effort is normal. No respiratory distress.  Musculoskeletal:     Cervical back: Normal range of motion.  Skin:    General: Skin is warm and dry.  Neurological:     Mental Status: She is alert and oriented to person, place, and time.  Psychiatric:        Mood and Affect: Mood normal.        Behavior: Behavior normal.     UC Treatments / Results  Labs (all labs ordered are listed, but only abnormal results are  displayed) Labs Reviewed  HIV ANTIBODY (ROUTINE TESTING W REFLEX)  RPR  CERVICOVAGINAL ANCILLARY ONLY    EKG   Radiology No results found.  Procedures Procedures (including critical care time)  Medications Ordered in UC Medications - No data to display  Initial Impression / Assessment and Plan / UC Course  I have reviewed the triage vital signs and the nursing notes.  Pertinent labs & imaging results that were available during my care of the patient were reviewed by me and considered in my medical decision making (see chart for details).     Vaginal swab performed an HIV/RPR also performed at patient request.  We will treat based off of results.  Given that she did mention some vaginal bleeding in the setting of being postmenopausal, recommended follow-up with gynecology.  She was given the number for [facility] to schedule this.   Final Clinical Impressions(s) / UC Diagnoses   Final diagnoses:  Vaginal discharge  Screen for sexually transmitted diseases     Discharge Instructions      You have been tested for STDs as well as bacterial vaginosis and a yeast infection.  We will contact you with results over the next few days and call in a prescription if needed based on these results.  No prescription was called in today, as we will wait to see what is the most appropriate treatment for you.  I do recommend that you follow-up with a primary care provider and a gynecologist, you can call [facility] at [phone] to schedule an appointment.     ED Prescriptions   None    PDMP not reviewed this encounter.   [place], Bernita Raisin, DO 12/08/21 [phone]

## 2021-12-08 NOTE — ED Triage Notes (Signed)
Pt reports vaginal itching since April. States feels like normal yeast infection she frequently gets. Also requesting blood work for STDs. States she has suspicion people have been "messing" on her cough and in her bed when she leaves the house.

## 2021-12-08 NOTE — Discharge Instructions (Signed)
You have been tested for STDs as well as bacterial vaginosis and a yeast infection.  We will contact you with results over the next few days and call in a prescription if needed based on these results.  No prescription was called in today, as we will wait to see what is the most appropriate treatment for you.  I do recommend that you follow-up with a primary care provider and a gynecologist, you can call [facility] at [phone] to schedule an appointment.

## 2021-12-09 ENCOUNTER — Telehealth (HOSPITAL_COMMUNITY): Payer: Self-pay | Admitting: Emergency Medicine

## 2021-12-09 LAB — CERVICOVAGINAL ANCILLARY ONLY
Bacterial Vaginitis (gardnerella): POSITIVE — AB
Candida Glabrata: POSITIVE — AB
Candida Vaginitis: POSITIVE — AB
Chlamydia: NEGATIVE
Comment: NEGATIVE
Comment: NEGATIVE
Comment: NEGATIVE
Comment: NEGATIVE
Comment: NEGATIVE
Comment: NORMAL
Neisseria Gonorrhea: NEGATIVE
Trichomonas: NEGATIVE

## 2021-12-09 LAB — RPR: RPR Ser Ql: NONREACTIVE

## 2021-12-09 MED ORDER — METRONIDAZOLE 500 MG PO TABS: 500.0000 mg | ORAL_TABLET | Freq: Two times a day (BID) | ORAL | 0 refills | Status: DC

## 2021-12-09 MED ORDER — FLUCONAZOLE 150 MG PO TABS: 150.0000 mg | ORAL_TABLET | Freq: Once | ORAL | 0 refills | Status: AC

## 2021-12-24 ENCOUNTER — Telehealth: Payer: Self-pay

## 2021-12-24 ENCOUNTER — Ambulatory Visit: Payer: Medicare Other | Admitting: Family

## 2021-12-27 ENCOUNTER — Ambulatory Visit: Payer: Medicare Other | Admitting: Orthopedic Surgery

## 2021-12-27 ENCOUNTER — Emergency Department (HOSPITAL_COMMUNITY)
Admission: EM | Admit: 2021-12-27 | Discharge: 2021-12-27 | Discharge status: Against Medical Advice | Payer: Medicare Other

## 2021-12-27 DIAGNOSIS — I10 Essential (primary) hypertension: Secondary | ICD-10-CM | POA: Diagnosis not present

## 2021-12-27 DIAGNOSIS — M79603 Pain in arm, unspecified: Secondary | ICD-10-CM | POA: Diagnosis not present

## 2021-12-30 NOTE — Telephone Encounter (Signed)
Noted, Thanks

## 2021-12-30 NOTE — Telephone Encounter (Signed)
Has [name] Long followed up with response? Message routed back to [place].K Engineer, building services, and Team Lead [email or web address].

## 2021-12-30 NOTE — Telephone Encounter (Signed)
[name] Long and I had a verbal conversation about this today and she plans to meet with [name] to discuss how to proceed with this situation.

## 2021-12-31 NOTE — Telephone Encounter (Signed)
Noted  

## 2022-01-12 ENCOUNTER — Telehealth: Payer: Self-pay | Admitting: Orthopedic Surgery

## 2022-01-12 NOTE — Telephone Encounter (Signed)
Please resubmit in Aug for pt's monovisc gel injections for Dr Sharol Given

## 2022-01-13 NOTE — Telephone Encounter (Signed)
Noted  

## 2022-01-24 ENCOUNTER — Other Ambulatory Visit: Payer: Self-pay | Admitting: Orthopaedic Surgery

## 2022-01-24 ENCOUNTER — Telehealth: Payer: Self-pay | Admitting: Orthopedic Surgery

## 2022-01-24 NOTE — Telephone Encounter (Signed)
Patient called asked if she can get the gel injection in both of her knees?  The number to contact patient is [phone]

## 2022-01-24 NOTE — Telephone Encounter (Signed)
She had bilateral knee monovisc injections on 09/09/21. She is not due until September. It has to be 6 months apart from last injections per insurance.

## 2022-01-24 NOTE — Telephone Encounter (Signed)
Also there is an open note to April J to resubmit for monovisc next month.

## 2022-01-24 NOTE — Telephone Encounter (Signed)
Pt called and states that

## 2022-01-27 ENCOUNTER — Other Ambulatory Visit: Payer: Self-pay | Admitting: Adult Health

## 2022-01-27 ENCOUNTER — Ambulatory Visit: Payer: Medicare Other | Admitting: Family

## 2022-01-27 ENCOUNTER — Encounter: Payer: Self-pay | Admitting: Adult Health

## 2022-01-27 ENCOUNTER — Ambulatory Visit (INDEPENDENT_AMBULATORY_CARE_PROVIDER_SITE_OTHER): Payer: Medicare Other | Admitting: Adult Health

## 2022-01-27 VITALS — BP 146/88 | HR 52 | Temp 96.7°F | Ht 65.0 in | Wt 155.4 lb

## 2022-01-27 DIAGNOSIS — J309 Allergic rhinitis, unspecified: Secondary | ICD-10-CM

## 2022-01-27 DIAGNOSIS — N898 Other specified noninflammatory disorders of vagina: Secondary | ICD-10-CM | POA: Diagnosis not present

## 2022-01-27 DIAGNOSIS — K219 Gastro-esophageal reflux disease without esophagitis: Secondary | ICD-10-CM | POA: Diagnosis not present

## 2022-01-27 DIAGNOSIS — R0981 Nasal congestion: Secondary | ICD-10-CM

## 2022-01-27 MED ORDER — NYSTATIN 100000 UNIT/GM EX CREA: 1.0000 | TOPICAL_CREAM | Freq: Two times a day (BID) | CUTANEOUS | 0 refills | Status: DC

## 2022-01-27 MED ORDER — SUCRALFATE 1 GM/10ML PO SUSP: ORAL | 11 refills | Status: DC

## 2022-01-27 MED ORDER — FLUTICASONE PROPIONATE 50 MCG/ACT NA SUSP: 2.0000 | Freq: Every day | NASAL | 0 refills | Status: DC

## 2022-01-27 NOTE — Progress Notes (Incomplete)
[facility] clinic  Provider:  Durenda Age DNP  Code Status:  Full Code  Goals of Care:     07/30/2021   11:27 AM  Advanced Directives  Does Patient Have a Medical Advance Directive? Yes  Does patient want to make changes to medical advance directive? No - Guardian declined     Chief Complaint  Patient presents with  . Medical Management of Chronic Issues    Patient returns to the clinic for vaginal recheck.     HPI: Patient is a 71 y.o. female seen today for an acute visit for vaginal check. She complains of vaginal itch. She has been putting the cream for jock itch that she bought from the [employer]. She does not recall the name. Granddaughter was with her during the clinic visit.No noted vaginal discharge. [place]  Past Medical History:  Diagnosis Date  . Abscessed tooth 02/09/2020  . Acute on chronic congestive heart failure ([place])   . Adenomatous colon polyp   . Arthritis    "real bad; all over" (07/12/2017)  . Asthma   . Atrial fibrillation ([place])   . BIPOLAR DISORDER 08/31/2006   Qualifier: Diagnosis of  By: Dorathy Daft MD, Marjory Lies    . CHRONIC KIDNEY DISEASE STAGE II (MILD) 09/14/2009   Annotation: eGFR 64 Qualifier: Diagnosis of  By: Jess Barters MD, [name]    . Chronic lower back pain   . Congestive heart failure (CHF) ([place])   . COPD (chronic obstructive pulmonary disease) ([place]) 09/23/2010   Diagnosed at [facility] in 2008 (Dr. Annamaria Boots)   . DM (diabetes mellitus) type II controlled with renal manifestation ([place]) 08/31/2006   Qualifier: Diagnosis of  By: Dorathy Daft MD, Marjory Lies    . Dyspnea    "all my life; since 6th grade" (07/12/2017)  . Fatty liver   . HYPERCHOLESTEROLEMIA 08/31/2006   Intolerance to Lipitor OK on Crestor but medicaid no longer covering    . HYPERTENSION, BENIGN SYSTEMIC 08/31/2006   Qualifier: Diagnosis of  By: Dorathy Daft MD, Marjory Lies    . Hypokalemia   . HYPOTHYROIDISM, UNSPECIFIED 08/31/2006   Qualifier: Diagnosis of  By: Dorathy Daft MD, Marjory Lies    . Leg swelling  03/14/2018  . Pneumonia    "3 times" (07/12/2017)  . Pulmonary nodule   . Renal cyst   . Schizophrenia ([place])   . Scoliosis   . Stomach problems   . Thyroid disorder     Past Surgical History:  Procedure Laterality Date  . CESAREAN SECTION    . FOOT FRACTURE SURGERY Right    "steel plate in it"  . FOOT SURGERY     " born w/dislocated foot"  . FRACTURE SURGERY    . KNEE ARTHROSCOPY Right   . TOE SURGERY Bilateral    "both pinky toes"  . TONSILLECTOMY AND ADENOIDECTOMY      Allergies  Allergen Reactions  . Citrus Anaphylaxis and Itching  . Fish Allergy Anaphylaxis    Cod  . Shellfish Allergy Shortness Of Breath and Other (See Comments)    "Affects thyroid" also  . Adhesive [Tape] Other (See Comments)    Must have paper tape only  . Atorvastatin Other (See Comments)  . Ibuprofen Swelling    Face swells  . Latex Dermatitis  . Lipitor [Atorvastatin Calcium] Other (See Comments)    Body aches  . Lisinopril Other (See Comments)    PER DR. Melvyn Novas (not recalled by patient)  . Other Nausea Only and Other (See Comments)    Collards (gas, too)  .  Codeine Rash  . Tramadol Palpitations    Outpatient Encounter Medications as of 01/27/2022  Medication Sig  . albuterol (VENTOLIN HFA) 108 (90 Base) MCG/ACT inhaler Inhale 2 puffs into the lungs 4 (four) times daily as needed for wheezing or shortness of breath.  Marland Kitchen amLODipine (NORVASC) 5 MG tablet Take 1 tablet (5 mg total) by mouth daily.  Marland Kitchen aspirin EC 81 MG EC tablet Take 1 tablet (81 mg total) by mouth daily. Swallow whole.  . Blood Glucose Monitoring Suppl (FREESTYLE LITE) w/Device KIT 1 each by Does not apply route daily. Dx:E11.9  . Blood Pressure Monitoring (BLOOD PRESSURE MONITOR/WRIST) KIT 1 Units by Does not apply route daily at 6 (six) AM.  . fluticasone (FLONASE) 50 MCG/ACT nasal spray Place 2 sprays into both nostrils daily.  . hydrochlorothiazide (HYDRODIURIL) 25 MG tablet Take 1 tablet (25 mg total) by mouth daily.  Marland Kitchen  ibuprofen (ADVIL) 800 MG tablet TAKE 1 TABLET(800 MG) BY MOUTH EVERY 8 HOURS AS NEEDED FOR MODERATE PAIN  . Lancets (FREESTYLE) lancets Use to check blood sugar once daily. Dx: E11.9  . levothyroxine (SYNTHROID) 25 MCG tablet Take 1 tablet (25 mcg total) by mouth daily before breakfast.  . metFORMIN (GLUCOPHAGE) 1000 MG tablet Take 1 tablet (1,000 mg total) by mouth 2 (two) times daily with a meal.  . methocarbamol (ROBAXIN) 500 MG tablet Take 1 tablet (500 mg total) by mouth 3 (three) times daily as needed for muscle spasms.  . metoprolol succinate (TOPROL-XL) 50 MG 24 hr tablet Take 1 tablet (50 mg total) by mouth daily.  . montelukast (SINGULAIR) 10 MG tablet Take 1 tablet (10 mg total) by mouth at bedtime.  . nitroGLYCERIN (NITROSTAT) 0.4 MG SL tablet Place under the tongue as needed.  . pantoprazole (PROTONIX) 40 MG tablet Take 1 tablet (40 mg total) by mouth daily.  . potassium chloride (KLOR-CON) 10 MEQ tablet Take 1 tablet (10 mEq total) by mouth 2 (two) times daily.  . predniSONE (DELTASONE) 10 MG tablet Take 1 tablet (10 mg total) by mouth daily with breakfast.  . rosuvastatin (CRESTOR) 40 MG tablet Take 1 tablet (40 mg total) by mouth daily.  . sucralfate (CARAFATE) 1 GM/10ML suspension SHAKE LIQUID AND TAKE 10 ML(1 GRAM) BY MOUTH FOUR TIMES DAILY WITH MEALS AND AT BEDTIME  . TRADJENTA 5 MG TABS tablet TAKE 1 TABLET(5 MG) BY MOUTH DAILY  . divalproex (DEPAKOTE) 250 MG DR tablet Take 3 tablets (750 mg total) by mouth every 12 (twelve) hours. (Patient not taking: Reported on 10/18/2021)  . metroNIDAZOLE (FLAGYL) 500 MG tablet Take 1 tablet (500 mg total) by mouth 2 (two) times daily.  . nitrofurantoin (MACRODANTIN) 50 MG capsule Take 50 mg by mouth daily. (Patient not taking: Reported on 01/27/2022)   No facility-administered encounter medications on file as of 01/27/2022.    Review of Systems:  Review of Systems  Health Maintenance  Topic Date Due  . Zoster Vaccines- Shingrix (1 of  2) Never done  . FOOT EXAM  04/26/2018  . COVID-19 Vaccine (3 - Pfizer series) 01/14/2020  . URINE MICROALBUMIN  03/23/2021  . TETANUS/TDAP  06/07/2021  . LIPID PANEL  10/18/2021  . HEMOGLOBIN A1C  10/18/2021  . OPHTHALMOLOGY EXAM  10/28/2021  . INFLUENZA VACCINE  02/01/2022  . COLONOSCOPY (Pts 45-49yr Insurance coverage will need to be confirmed)  11/05/2022  . MAMMOGRAM  06/01/2023  . Pneumonia Vaccine 70 Years old  Completed  . DEXA SCAN  Completed  . Hepatitis  C Screening  Completed  . HPV VACCINES  Aged Out    Physical Exam: Vitals:   01/27/22 1438  BP: (!) 146/88  Pulse: (!) 52  Temp: (!) 96.7 F (35.9 C)  SpO2: 99%  Weight: 155 lb 6.4 oz (70.5 kg)  Height: _0  (1.651 m)   Body mass index is 25.86 kg/m. Physical Exam  Labs reviewed: Basic Metabolic Panel: Recent Labs    04/19/21 1933 04/22/21 1026 05/01/21 0545 05/07/21 1415 05/07/21 1426  NA  --  133* 130* 127* 130*  K  --  3.5 4.2 3.8 3.8  CL  --  98 93* 92* 93*  CO2  --  _1 --   GLUCOSE  --  173* 114* 263* 267*  BUN  --  27* _2 CREATININE  --  1.14* 1.23* 1.49* 1.50*  CALCIUM  --  9.7 8.9 8.7*  --   TSH 1.615  --   --   --   --    Liver Function Tests: Recent Labs    04/17/21 1938 05/07/21 1415  AST 19 16  ALT 16 12  ALKPHOS 94 92  BILITOT 0.5 0.3  PROT 7.6 6.4*  ALBUMIN 3.4* 2.8*   No results for input(s): "LIPASE", "AMYLASE" in the last 8760 hours. No results for input(s): "AMMONIA" in the last 8760 hours. CBC: Recent Labs    04/17/21 1938 05/07/21 1415 05/07/21 1426  WBC 6.3 5.1  --   NEUTROABS 2.4 3.1  --   HGB 11.2* 9.6* 10.5*  HCT 34.2* 29.7* 31.0*  MCV 96.9 96.1  --   PLT 249 158  --    Lipid Panel: Recent Labs    04/19/21 1933  CHOL 166  HDL 43  LDLCALC 80  TRIG 213*  CHOLHDL 3.9   Lab Results  Component Value Date   HGBA1C 8.3 (H) 04/19/2021    Procedures since last visit: No results found.  Assessment/Plan     Labs/tests ordered:   None  Next appt:  as needed

## 2022-01-27 NOTE — Patient Instructions (Signed)
Vaginal Yeast Infection, Adult  Vaginal yeast infection is a condition that causes vaginal discharge as well as soreness, swelling, and redness (inflammation) of the vagina. This is a common condition. Some women get this infection frequently. What are the causes? This condition is caused by a change in the normal balance of the yeast (Candida) and normal bacteria that live in the vagina. This change causes an overgrowth of yeast, which causes the inflammation. What increases the risk? The condition is more likely to develop in women who: Take antibiotic medicines. Have diabetes. Take birth control pills. Are pregnant. Douche often. Have a weak body defense system (immune system). Have been taking steroid medicines for a long time. Frequently wear tight clothing. What are the signs or symptoms? Symptoms of this condition include: White, thick, creamy vaginal discharge. Swelling, itching, redness, and irritation of the vagina. The lips of the vagina (labia) may be affected as well. Pain or a burning feeling while urinating. Pain during sex. How is this diagnosed? This condition is diagnosed based on: Your medical history. A physical exam. A pelvic exam. Your health care provider will examine a sample of your vaginal discharge under a microscope. Your health care provider may send this sample for testing to confirm the diagnosis. How is this treated? This condition is treated with medicine. Medicines may be over-the-counter or prescription. You may be told to use one or more of the following: Medicine that is taken by mouth (orally). Medicine that is applied as a cream (topically). Medicine that is inserted directly into the vagina (suppository). Follow these instructions at home: Take or apply over-the-counter and prescription medicines only as told by your health care provider. Do not use tampons until your health care provider approves. Do not have sex until your infection has  cleared. Sex can prolong or worsen your symptoms of infection. Ask your health care provider when it is safe to resume sexual activity. Keep all follow-up visits. This is important. How is this prevented?  Do not wear tight clothes, such as pantyhose or tight pants. Wear breathable cotton underwear. Do not use douches, perfumed soap, creams, or powders. Wipe from front to back after using the toilet. If you have diabetes, keep your blood sugar levels under control. Ask your health care provider for other ways to prevent yeast infections. Contact a health care provider if: You have a fever. Your symptoms go away and then return. Your symptoms do not get better with treatment. Your symptoms get worse. You have new symptoms. You develop blisters in or around your vagina. You have blood coming from your vagina and it is not your menstrual period. You develop pain in your abdomen. Summary Vaginal yeast infection is a condition that causes discharge as well as soreness, swelling, and redness (inflammation) of the vagina. This condition is treated with medicine. Medicines may be over-the-counter or prescription. Take or apply over-the-counter and prescription medicines only as told by your health care provider. Do not douche. Resume sexual activity or use of tampons as instructed by your health care provider. Contact a health care provider if your symptoms do not get better with treatment or your symptoms go away and then return. This information is not intended to replace advice given to you by your health care provider. Make sure you discuss any questions you have with your health care provider. Document Revised: 09/07/2020 Document Reviewed: 09/07/2020 Elsevier Patient Education  2023 Elsevier Inc.  

## 2022-01-27 NOTE — Progress Notes (Signed)
[facility] clinic  Provider:  Durenda Age DNP  Code Status:  Full Code  Goals of Care:     07/30/2021   11:27 AM  Advanced Directives  Does Patient Have a Medical Advance Directive? Yes  Does patient want to make changes to medical advance directive? No - Guardian declined     Chief Complaint  Patient presents with   Medical Management of Chronic Issues    Patient returns to the clinic for vaginal recheck.     HPI: Patient is a 71 y.o. female seen today for an acute visit for vaginal check. She complains of vaginal itch. She has been putting the cream for jock itch that she bought from the [employer]. She does not recall the name. Granddaughter was with her during the clinic visit.No noted vaginal discharge. Slight erythematous rashes noted on bilateral labia majora.  CMA was in the room during the vaginal examination. She requested for a refill on her sucralfate and Flonase.    Past Medical History:  Diagnosis Date   Abscessed tooth 02/09/2020   Acute on chronic congestive heart failure ([place])    Adenomatous colon polyp    Arthritis    "real bad; all over" (07/12/2017)   Asthma    Atrial fibrillation ([place])    BIPOLAR DISORDER 08/31/2006   Qualifier: Diagnosis of  By: Dorathy Daft MD, Marjory Lies     CHRONIC KIDNEY DISEASE STAGE II (MILD) 09/14/2009   Annotation: eGFR 41 Qualifier: Diagnosis of  By: Jess Barters MD, [name]     Chronic lower back pain    Congestive heart failure (CHF) ([place])    COPD (chronic obstructive pulmonary disease) ([place]) 09/23/2010   Diagnosed at [facility] in 2008 (Dr. Annamaria Boots)    DM (diabetes mellitus) type II controlled with renal manifestation ([place]) 08/31/2006   Qualifier: Diagnosis of  By: Dorathy Daft MD, Marjory Lies     Dyspnea    "all my life; since 6th grade" (07/12/2017)   Fatty liver    HYPERCHOLESTEROLEMIA 08/31/2006   Intolerance to Lipitor OK on Crestor but medicaid no longer covering     HYPERTENSION, BENIGN SYSTEMIC 08/31/2006   Qualifier: Diagnosis of  By:  Dorathy Daft MD, Aaron     Hypokalemia    HYPOTHYROIDISM, UNSPECIFIED 08/31/2006   Qualifier: Diagnosis of  By: Dorathy Daft MD, Marjory Lies     Leg swelling 03/14/2018   Pneumonia    "3 times" (07/12/2017)   Pulmonary nodule    Renal cyst    Schizophrenia ([place])    Scoliosis    Stomach problems    Thyroid disorder     Past Surgical History:  Procedure Laterality Date   CESAREAN SECTION     FOOT FRACTURE SURGERY Right    "steel plate in it"   FOOT SURGERY     " born w/dislocated foot"   FRACTURE SURGERY     KNEE ARTHROSCOPY Right    TOE SURGERY Bilateral    "both pinky toes"   TONSILLECTOMY AND ADENOIDECTOMY      Allergies  Allergen Reactions   Citrus Anaphylaxis and Itching   Fish Allergy Anaphylaxis    Cod   Shellfish Allergy Shortness Of Breath and Other (See Comments)    "Affects thyroid" also   Adhesive [Tape] Other (See Comments)    Must have paper tape only   Atorvastatin Other (See Comments)   Ibuprofen Swelling    Face swells   Latex Dermatitis   Lipitor [Atorvastatin Calcium] Other (See Comments)    Body aches  Lisinopril Other (See Comments)    PER DR. Melvyn Novas (not recalled by patient)   Other Nausea Only and Other (See Comments)    Collards (gas, too)   Codeine Rash   Tramadol Palpitations    Outpatient Encounter Medications as of 01/27/2022  Medication Sig   albuterol (VENTOLIN HFA) 108 (90 Base) MCG/ACT inhaler Inhale 2 puffs into the lungs 4 (four) times daily as needed for wheezing or shortness of breath.   amLODipine (NORVASC) 5 MG tablet Take 1 tablet (5 mg total) by mouth daily.   aspirin EC 81 MG EC tablet Take 1 tablet (81 mg total) by mouth daily. Swallow whole.   Blood Glucose Monitoring Suppl (FREESTYLE LITE) w/Device KIT 1 each by Does not apply route daily. Dx:E11.9   Blood Pressure Monitoring (BLOOD PRESSURE MONITOR/WRIST) KIT 1 Units by Does not apply route daily at 6 (six) AM.   fluticasone (FLONASE) 50 MCG/ACT nasal spray Place 2 sprays into both  nostrils daily.   hydrochlorothiazide (HYDRODIURIL) 25 MG tablet Take 1 tablet (25 mg total) by mouth daily.   ibuprofen (ADVIL) 800 MG tablet TAKE 1 TABLET(800 MG) BY MOUTH EVERY 8 HOURS AS NEEDED FOR MODERATE PAIN   Lancets (FREESTYLE) lancets Use to check blood sugar once daily. Dx: E11.9   levothyroxine (SYNTHROID) 25 MCG tablet Take 1 tablet (25 mcg total) by mouth daily before breakfast.   metFORMIN (GLUCOPHAGE) 1000 MG tablet Take 1 tablet (1,000 mg total) by mouth 2 (two) times daily with a meal.   methocarbamol (ROBAXIN) 500 MG tablet Take 1 tablet (500 mg total) by mouth 3 (three) times daily as needed for muscle spasms.   metoprolol succinate (TOPROL-XL) 50 MG 24 hr tablet Take 1 tablet (50 mg total) by mouth daily.   montelukast (SINGULAIR) 10 MG tablet Take 1 tablet (10 mg total) by mouth at bedtime.   nitroGLYCERIN (NITROSTAT) 0.4 MG SL tablet Place under the tongue as needed.   pantoprazole (PROTONIX) 40 MG tablet Take 1 tablet (40 mg total) by mouth daily.   potassium chloride (KLOR-CON) 10 MEQ tablet Take 1 tablet (10 mEq total) by mouth 2 (two) times daily.   predniSONE (DELTASONE) 10 MG tablet Take 1 tablet (10 mg total) by mouth daily with breakfast.   rosuvastatin (CRESTOR) 40 MG tablet Take 1 tablet (40 mg total) by mouth daily.   sucralfate (CARAFATE) 1 GM/10ML suspension SHAKE LIQUID AND TAKE 10 ML(1 GRAM) BY MOUTH FOUR TIMES DAILY WITH MEALS AND AT BEDTIME   TRADJENTA 5 MG TABS tablet TAKE 1 TABLET(5 MG) BY MOUTH DAILY   divalproex (DEPAKOTE) 250 MG DR tablet Take 3 tablets (750 mg total) by mouth every 12 (twelve) hours. (Patient not taking: Reported on 10/18/2021)   metroNIDAZOLE (FLAGYL) 500 MG tablet Take 1 tablet (500 mg total) by mouth 2 (two) times daily.   nitrofurantoin (MACRODANTIN) 50 MG capsule Take 50 mg by mouth daily. (Patient not taking: Reported on 01/27/2022)   No facility-administered encounter medications on file as of 01/27/2022.    Review of Systems:   Review of Systems  Constitutional:  Negative for appetite change, chills, fatigue and fever.  HENT:  Negative for congestion, hearing loss, rhinorrhea and sore throat.   Eyes: Negative.   Respiratory:  Negative for cough, shortness of breath and wheezing.   Cardiovascular:  Negative for chest pain, palpitations and leg swelling.  Gastrointestinal:  Negative for abdominal pain, constipation, diarrhea, nausea and vomiting.  Genitourinary:  Negative for dysuria.  Complains of vaginal itch  Musculoskeletal:  Negative for arthralgias, back pain and myalgias.  Skin:  Negative for color change, rash and wound.  Neurological:  Negative for dizziness, weakness and headaches.  Psychiatric/Behavioral:  Negative for behavioral problems. The patient is not nervous/anxious.     Health Maintenance  Topic Date Due   Zoster Vaccines- Shingrix (1 of 2) Never done   FOOT EXAM  04/26/2018   COVID-19 Vaccine (3 - Pfizer series) 01/14/2020   URINE MICROALBUMIN  03/23/2021   TETANUS/TDAP  06/07/2021   LIPID PANEL  10/18/2021   HEMOGLOBIN A1C  10/18/2021   OPHTHALMOLOGY EXAM  10/28/2021   INFLUENZA VACCINE  02/01/2022   COLONOSCOPY (Pts 45-5yr Insurance coverage will need to be confirmed)  11/05/2022   MAMMOGRAM  06/01/2023   Pneumonia Vaccine 70 Years old  Completed   DEXA SCAN  Completed   Hepatitis C Screening  Completed   HPV VACCINES  Aged Out    Physical Exam: Vitals:   01/27/22 1438  BP: (!) 146/88  Pulse: (!) 52  Temp: (!) 96.7 F (35.9 C)  SpO2: 99%  Weight: 155 lb 6.4 oz (70.5 kg)  Height: '5\' 5"'  (1.651 m)   Body mass index is 25.86 kg/m. Physical Exam Constitutional:      General: She is not in acute distress.    Appearance: Normal appearance.  HENT:     Head: Normocephalic and atraumatic.     Nose: Nose normal.     Mouth/Throat:     Mouth: Mucous membranes are moist.  Eyes:     Conjunctiva/sclera: Conjunctivae normal.  Cardiovascular:     Rate and Rhythm:  Normal rate and regular rhythm.  Pulmonary:     Effort: Pulmonary effort is normal.     Breath sounds: Normal breath sounds.  Abdominal:     General: Bowel sounds are normal.     Palpations: Abdomen is soft.  Genitourinary:    Vagina: No vaginal discharge.     Comments: Has slight erythema to bilateral labia majora Musculoskeletal:        General: Normal range of motion.     Cervical back: Normal range of motion.  Skin:    General: Skin is warm and dry.  Neurological:     General: No focal deficit present.     Mental Status: She is alert and oriented to person, place, and time.  Psychiatric:        Mood and Affect: Mood normal.     Labs reviewed: Basic Metabolic Panel: Recent Labs    04/19/21 1933 04/22/21 1026 05/01/21 0545 05/07/21 1415 05/07/21 1426  NA  --  133* 130* 127* 130*  K  --  3.5 4.2 3.8 3.8  CL  --  98 93* 92* 93*  CO2  --  '26 29 26  ' --   GLUCOSE  --  173* 114* 263* 267*  BUN  --  27* '21 22 22  ' CREATININE  --  1.14* 1.23* 1.49* 1.50*  CALCIUM  --  9.7 8.9 8.7*  --   TSH 1.615  --   --   --   --    Liver Function Tests: Recent Labs    04/17/21 1938 05/07/21 1415  AST 19 16  ALT 16 12  ALKPHOS 94 92  BILITOT 0.5 0.3  PROT 7.6 6.4*  ALBUMIN 3.4* 2.8*   No results for input(s): "LIPASE", "AMYLASE" in the last 8760 hours. No results for input(s): "AMMONIA" in the last 8760 hours.  CBC: Recent Labs    04/17/21 1938 05/07/21 1415 05/07/21 1426  WBC 6.3 5.1  --   NEUTROABS 2.4 3.1  --   HGB 11.2* 9.6* 10.5*  HCT 34.2* 29.7* 31.0*  MCV 96.9 96.1  --   PLT 249 158  --    Lipid Panel: Recent Labs    04/19/21 1933  CHOL 166  HDL 43  LDLCALC 80  TRIG 213*  CHOLHDL 3.9   Lab Results  Component Value Date   HGBA1C 8.3 (H) 04/19/2021    Procedures since last visit: No results found.  Assessment/Plan  1. Vaginal itching -  keep labia majora area dry as possible/avoid moisture - nystatin cream (MYCOSTATIN); Apply 1 Application  topically 2 (two) times daily.  Dispense: 30 g; Refill: 0 -  continue Nystatin cream X 2 weeks and notify clinic if itch does not resolve in 2 weeks  2. Allergic rhinitis, unspecified seasonality, unspecified trigger - fluticasone (FLONASE) 50 MCG/ACT nasal spray; Place 2 sprays into both nostrils daily.  Dispense: 16 g; Refill: 0  3. Gastroesophageal reflux disease, unspecified whether esophagitis present - sucralfate (CARAFATE) 1 GM/10ML suspension; SHAKE LIQUID AND TAKE 10 ML(1 GRAM) BY MOUTH FOUR TIMES DAILY WITH MEALS AND AT BEDTIME  Dispense: 420 mL; Refill: 11    Labs/tests ordered:  None  Next appt:  as needed

## 2022-02-07 ENCOUNTER — Ambulatory Visit: Payer: Medicare Other | Admitting: Orthopedic Surgery

## 2022-02-07 NOTE — Telephone Encounter (Signed)
ERROR

## 2022-02-16 ENCOUNTER — Other Ambulatory Visit: Payer: Self-pay | Admitting: Orthopedic Surgery

## 2022-03-07 ENCOUNTER — Encounter (HOSPITAL_COMMUNITY): Payer: Self-pay | Admitting: Emergency Medicine

## 2022-03-07 ENCOUNTER — Other Ambulatory Visit: Payer: Self-pay

## 2022-03-07 ENCOUNTER — Inpatient Hospital Stay (HOSPITAL_COMMUNITY)
Admission: EM | Admit: 2022-03-07 | Discharge: 2022-03-21 | DRG: 281 | Disposition: A | Discharge status: Other Acute Inpatient Hospital | Payer: Medicare Other | Attending: Internal Medicine | Admitting: Internal Medicine

## 2022-03-07 ENCOUNTER — Emergency Department (HOSPITAL_COMMUNITY): Payer: Medicare Other

## 2022-03-07 DIAGNOSIS — E119 Type 2 diabetes mellitus without complications: Secondary | ICD-10-CM

## 2022-03-07 DIAGNOSIS — Z885 Allergy status to narcotic agent status: Secondary | ICD-10-CM | POA: Diagnosis not present

## 2022-03-07 DIAGNOSIS — K219 Gastro-esophageal reflux disease without esophagitis: Secondary | ICD-10-CM | POA: Diagnosis present

## 2022-03-07 DIAGNOSIS — Z20822 Contact with and (suspected) exposure to covid-19: Secondary | ICD-10-CM | POA: Diagnosis not present

## 2022-03-07 DIAGNOSIS — Z91013 Allergy to seafood: Secondary | ICD-10-CM

## 2022-03-07 DIAGNOSIS — Z86010 Personal history of colonic polyps: Secondary | ICD-10-CM | POA: Diagnosis not present

## 2022-03-07 DIAGNOSIS — E876 Hypokalemia: Secondary | ICD-10-CM | POA: Diagnosis present

## 2022-03-07 DIAGNOSIS — E039 Hypothyroidism, unspecified: Secondary | ICD-10-CM | POA: Diagnosis not present

## 2022-03-07 DIAGNOSIS — I5020 Unspecified systolic (congestive) heart failure: Secondary | ICD-10-CM | POA: Diagnosis not present

## 2022-03-07 DIAGNOSIS — F250 Schizoaffective disorder, bipolar type: Secondary | ICD-10-CM | POA: Diagnosis present

## 2022-03-07 DIAGNOSIS — M25561 Pain in right knee: Secondary | ICD-10-CM | POA: Diagnosis present

## 2022-03-07 DIAGNOSIS — Z825 Family history of asthma and other chronic lower respiratory diseases: Secondary | ICD-10-CM

## 2022-03-07 DIAGNOSIS — E785 Hyperlipidemia, unspecified: Secondary | ICD-10-CM | POA: Diagnosis not present

## 2022-03-07 DIAGNOSIS — Z91040 Latex allergy status: Secondary | ICD-10-CM | POA: Diagnosis not present

## 2022-03-07 DIAGNOSIS — Z803 Family history of malignant neoplasm of breast: Secondary | ICD-10-CM | POA: Diagnosis not present

## 2022-03-07 DIAGNOSIS — I214 Non-ST elevation (NSTEMI) myocardial infarction: Secondary | ICD-10-CM | POA: Diagnosis not present

## 2022-03-07 DIAGNOSIS — Z91148 Patient's other noncompliance with medication regimen for other reason: Secondary | ICD-10-CM

## 2022-03-07 DIAGNOSIS — I5032 Chronic diastolic (congestive) heart failure: Secondary | ICD-10-CM | POA: Diagnosis not present

## 2022-03-07 DIAGNOSIS — J9811 Atelectasis: Secondary | ICD-10-CM | POA: Diagnosis not present

## 2022-03-07 DIAGNOSIS — Z800 Family history of malignant neoplasm of digestive organs: Secondary | ICD-10-CM

## 2022-03-07 DIAGNOSIS — E7800 Pure hypercholesterolemia, unspecified: Secondary | ICD-10-CM | POA: Diagnosis present

## 2022-03-07 DIAGNOSIS — J449 Chronic obstructive pulmonary disease, unspecified: Secondary | ICD-10-CM | POA: Diagnosis not present

## 2022-03-07 DIAGNOSIS — Z888 Allergy status to other drugs, medicaments and biological substances status: Secondary | ICD-10-CM

## 2022-03-07 DIAGNOSIS — E1122 Type 2 diabetes mellitus with diabetic chronic kidney disease: Secondary | ICD-10-CM | POA: Diagnosis not present

## 2022-03-07 DIAGNOSIS — R079 Chest pain, unspecified: Secondary | ICD-10-CM | POA: Diagnosis not present

## 2022-03-07 DIAGNOSIS — Z79899 Other long term (current) drug therapy: Secondary | ICD-10-CM | POA: Diagnosis not present

## 2022-03-07 DIAGNOSIS — I130 Hypertensive heart and chronic kidney disease with heart failure and stage 1 through stage 4 chronic kidney disease, or unspecified chronic kidney disease: Secondary | ICD-10-CM | POA: Diagnosis present

## 2022-03-07 DIAGNOSIS — N1832 Chronic kidney disease, stage 3b: Secondary | ICD-10-CM | POA: Diagnosis present

## 2022-03-07 DIAGNOSIS — Z046 Encounter for general psychiatric examination, requested by authority: Secondary | ICD-10-CM

## 2022-03-07 DIAGNOSIS — N183 Chronic kidney disease, stage 3 unspecified: Secondary | ICD-10-CM

## 2022-03-07 DIAGNOSIS — Z886 Allergy status to analgesic agent status: Secondary | ICD-10-CM

## 2022-03-07 DIAGNOSIS — Z7982 Long term (current) use of aspirin: Secondary | ICD-10-CM | POA: Diagnosis not present

## 2022-03-07 DIAGNOSIS — F259 Schizoaffective disorder, unspecified: Secondary | ICD-10-CM | POA: Diagnosis present

## 2022-03-07 DIAGNOSIS — I5042 Chronic combined systolic (congestive) and diastolic (congestive) heart failure: Secondary | ICD-10-CM | POA: Diagnosis present

## 2022-03-07 DIAGNOSIS — N182 Chronic kidney disease, stage 2 (mild): Secondary | ICD-10-CM | POA: Diagnosis present

## 2022-03-07 DIAGNOSIS — F17210 Nicotine dependence, cigarettes, uncomplicated: Secondary | ICD-10-CM | POA: Diagnosis present

## 2022-03-07 DIAGNOSIS — K760 Fatty (change of) liver, not elsewhere classified: Secondary | ICD-10-CM | POA: Diagnosis present

## 2022-03-07 DIAGNOSIS — I480 Paroxysmal atrial fibrillation: Secondary | ICD-10-CM | POA: Diagnosis not present

## 2022-03-07 DIAGNOSIS — I4891 Unspecified atrial fibrillation: Secondary | ICD-10-CM | POA: Diagnosis present

## 2022-03-07 DIAGNOSIS — Z823 Family history of stroke: Secondary | ICD-10-CM

## 2022-03-07 DIAGNOSIS — Z7952 Long term (current) use of systemic steroids: Secondary | ICD-10-CM

## 2022-03-07 DIAGNOSIS — Z8249 Family history of ischemic heart disease and other diseases of the circulatory system: Secondary | ICD-10-CM

## 2022-03-07 DIAGNOSIS — J811 Chronic pulmonary edema: Secondary | ICD-10-CM | POA: Diagnosis not present

## 2022-03-07 DIAGNOSIS — Z79890 Hormone replacement therapy (postmenopausal): Secondary | ICD-10-CM

## 2022-03-07 DIAGNOSIS — Z833 Family history of diabetes mellitus: Secondary | ICD-10-CM

## 2022-03-07 DIAGNOSIS — Z7984 Long term (current) use of oral hypoglycemic drugs: Secondary | ICD-10-CM

## 2022-03-07 DIAGNOSIS — J4489 Other specified chronic obstructive pulmonary disease: Secondary | ICD-10-CM | POA: Diagnosis present

## 2022-03-07 DIAGNOSIS — I440 Atrioventricular block, first degree: Secondary | ICD-10-CM | POA: Diagnosis present

## 2022-03-07 DIAGNOSIS — Z7901 Long term (current) use of anticoagulants: Secondary | ICD-10-CM | POA: Diagnosis not present

## 2022-03-07 DIAGNOSIS — Z720 Tobacco use: Secondary | ICD-10-CM | POA: Diagnosis present

## 2022-03-07 DIAGNOSIS — I10 Essential (primary) hypertension: Secondary | ICD-10-CM | POA: Diagnosis present

## 2022-03-07 DIAGNOSIS — E1165 Type 2 diabetes mellitus with hyperglycemia: Secondary | ICD-10-CM | POA: Diagnosis present

## 2022-03-07 DIAGNOSIS — Z794 Long term (current) use of insulin: Secondary | ICD-10-CM | POA: Diagnosis not present

## 2022-03-07 LAB — CK: Total CK: 251 U/L — ABNORMAL HIGH (ref 38–234)

## 2022-03-07 LAB — CBC WITH DIFFERENTIAL/PLATELET
Abs Immature Granulocytes: 0.01 10*3/uL (ref 0.00–0.07)
Basophils Absolute: 0 10*3/uL (ref 0.0–0.1)
Basophils Relative: 0 %
Eosinophils Absolute: 0 10*3/uL (ref 0.0–0.5)
Eosinophils Relative: 0 %
HCT: 36.3 % (ref 36.0–46.0)
Hemoglobin: 11.7 g/dL — ABNORMAL LOW (ref 12.0–15.0)
Immature Granulocytes: 0 %
Lymphocytes Relative: 14 %
Lymphs Abs: 0.7 10*3/uL (ref 0.7–4.0)
MCH: 31.6 pg (ref 26.0–34.0)
MCHC: 32.2 g/dL (ref 30.0–36.0)
MCV: 98.1 fL (ref 80.0–100.0)
Monocytes Absolute: 0.2 10*3/uL (ref 0.1–1.0)
Monocytes Relative: 3 %
Neutro Abs: 4.2 10*3/uL (ref 1.7–7.7)
Neutrophils Relative %: 83 %
Platelets: 213 10*3/uL (ref 150–400)
RBC: 3.7 MIL/uL — ABNORMAL LOW (ref 3.87–5.11)
RDW: 15 % (ref 11.5–15.5)
WBC: 5.1 10*3/uL (ref 4.0–10.5)
nRBC: 0 % (ref 0.0–0.2)

## 2022-03-07 LAB — COMPREHENSIVE METABOLIC PANEL
ALT: 15 U/L (ref 0–44)
AST: 18 U/L (ref 15–41)
Albumin: 3.4 g/dL — ABNORMAL LOW (ref 3.5–5.0)
Alkaline Phosphatase: 72 U/L (ref 38–126)
Anion gap: 11 (ref 5–15)
BUN: 15 mg/dL (ref 8–23)
CO2: 18 mmol/L — ABNORMAL LOW (ref 22–32)
Calcium: 9.4 mg/dL (ref 8.9–10.3)
Chloride: 109 mmol/L (ref 98–111)
Creatinine, Ser: 1.37 mg/dL — ABNORMAL HIGH (ref 0.44–1.00)
GFR, Estimated: 42 mL/min — ABNORMAL LOW (ref >60)
Glucose, Bld: 258 mg/dL — ABNORMAL HIGH (ref 70–99)
Potassium: 3.3 mmol/L — ABNORMAL LOW (ref 3.5–5.1)
Sodium: 138 mmol/L (ref 135–145)
Total Bilirubin: 0.3 mg/dL (ref 0.3–1.2)
Total Protein: 7.3 g/dL (ref 6.5–8.1)

## 2022-03-07 LAB — HEMOGLOBIN A1C
Hgb A1c MFr Bld: 9.7 % — ABNORMAL HIGH (ref 4.8–5.6)
Mean Plasma Glucose: 231.69 mg/dL

## 2022-03-07 LAB — TROPONIN I (HIGH SENSITIVITY)
Troponin I (High Sensitivity): 615 ng/L (ref <18)
Troponin I (High Sensitivity): 1305 ng/L (ref <18)
Troponin I (High Sensitivity): 2475 ng/L (ref <18)

## 2022-03-07 LAB — RAPID URINE DRUG SCREEN, HOSP PERFORMED
Amphetamines: NOT DETECTED
Barbiturates: NOT DETECTED
Benzodiazepines: NOT DETECTED
Cocaine: NOT DETECTED
Opiates: NOT DETECTED
Tetrahydrocannabinol: NOT DETECTED

## 2022-03-07 LAB — RESP PANEL BY RT-PCR (FLU A&B, COVID) ARPGX2
Influenza A by PCR: NEGATIVE
Influenza B by PCR: NEGATIVE
SARS Coronavirus 2 by RT PCR: NEGATIVE

## 2022-03-07 LAB — MAGNESIUM: Magnesium: 2 mg/dL (ref 1.7–2.4)

## 2022-03-07 LAB — TSH: TSH: 0.471 u[IU]/mL (ref 0.350–4.500)

## 2022-03-07 LAB — HEPARIN LEVEL (UNFRACTIONATED): Heparin Unfractionated: 0.29 IU/mL — ABNORMAL LOW (ref 0.30–0.70)

## 2022-03-07 LAB — BRAIN NATRIURETIC PEPTIDE: B Natriuretic Peptide: 163.1 pg/mL — ABNORMAL HIGH (ref 0.0–100.0)

## 2022-03-07 LAB — CBG MONITORING, ED: Glucose-Capillary: 146 mg/dL — ABNORMAL HIGH (ref 70–99)

## 2022-03-07 LAB — ETHANOL: Alcohol, Ethyl (B): <10 mg/dL (ref <10)

## 2022-03-07 MED ORDER — STERILE WATER FOR INJECTION IJ SOLN
INTRAMUSCULAR | Status: AC
  Filled 2022-03-07: qty 10

## 2022-03-07 MED ORDER — NITROGLYCERIN 0.4 MG SL SUBL: 0.4000 mg | SUBLINGUAL_TABLET | SUBLINGUAL | Status: DC | PRN

## 2022-03-07 MED ORDER — ASPIRIN 81 MG PO CHEW
324.0000 mg | CHEWABLE_TABLET | ORAL | Status: AC
  Administered 2022-03-07: 324 mg via ORAL
  Filled 2022-03-07 (×2): qty 4

## 2022-03-07 MED ORDER — HEPARIN BOLUS VIA INFUSION
4000.0000 [IU] | Freq: Once | INTRAVENOUS | Status: AC
  Administered 2022-03-07: 4000 [IU] via INTRAVENOUS
  Filled 2022-03-07: qty 4000

## 2022-03-07 MED ORDER — INSULIN ASPART 100 UNIT/ML IJ SOLN
0.0000 [IU] | Freq: Three times a day (TID) | INTRAMUSCULAR | Status: DC
  Administered 2022-03-07: 1 [IU] via SUBCUTANEOUS
  Administered 2022-03-08 – 2022-03-09 (×3): 2 [IU] via SUBCUTANEOUS
  Administered 2022-03-09: 3 [IU] via SUBCUTANEOUS
  Administered 2022-03-09 – 2022-03-11 (×4): 2 [IU] via SUBCUTANEOUS
  Administered 2022-03-11: 3 [IU] via SUBCUTANEOUS
  Administered 2022-03-12 (×2): 2 [IU] via SUBCUTANEOUS
  Administered 2022-03-13 – 2022-03-14 (×3): 3 [IU] via SUBCUTANEOUS
  Administered 2022-03-14: 1 [IU] via SUBCUTANEOUS
  Administered 2022-03-14 – 2022-03-21 (×3): 2 [IU] via SUBCUTANEOUS

## 2022-03-07 MED ORDER — LORAZEPAM 2 MG/ML IJ SOLN
2.0000 mg | Freq: Once | INTRAMUSCULAR | Status: AC
  Administered 2022-03-07: 2 mg via INTRAMUSCULAR
  Filled 2022-03-07: qty 1

## 2022-03-07 MED ORDER — ROSUVASTATIN CALCIUM 20 MG PO TABS
40.0000 mg | ORAL_TABLET | Freq: Every day | ORAL | Status: DC
  Administered 2022-03-07 – 2022-03-21 (×14): 40 mg via ORAL
  Filled 2022-03-07 (×14): qty 2

## 2022-03-07 MED ORDER — OLANZAPINE 10 MG IM SOLR
10.0000 mg | Freq: Once | INTRAMUSCULAR | Status: AC
  Administered 2022-03-07: 10 mg via INTRAMUSCULAR
  Filled 2022-03-07 (×2): qty 10

## 2022-03-07 MED ORDER — ACETAMINOPHEN 325 MG PO TABS
650.0000 mg | ORAL_TABLET | ORAL | Status: DC | PRN
  Administered 2022-03-08: 650 mg via ORAL
  Filled 2022-03-07 (×2): qty 2

## 2022-03-07 MED ORDER — POTASSIUM CHLORIDE CRYS ER 20 MEQ PO TBCR
30.0000 meq | EXTENDED_RELEASE_TABLET | Freq: Once | ORAL | Status: AC
  Administered 2022-03-07: 30 meq via ORAL
  Filled 2022-03-07: qty 1

## 2022-03-07 MED ORDER — HEPARIN (PORCINE) 25000 UT/250ML-% IV SOLN
1000.0000 [IU]/h | INTRAVENOUS | Status: DC
Start: 2022-03-07 — End: 2022-03-08
  Administered 2022-03-07: 850 [IU]/h via INTRAVENOUS
  Administered 2022-03-08: 1000 [IU]/h via INTRAVENOUS
  Filled 2022-03-07 (×2): qty 250

## 2022-03-07 MED ORDER — METOPROLOL SUCCINATE ER 50 MG PO TB24
50.0000 mg | ORAL_TABLET | Freq: Every day | ORAL | Status: DC
  Administered 2022-03-07 – 2022-03-21 (×14): 50 mg via ORAL
  Filled 2022-03-07 (×2): qty 1
  Filled 2022-03-07: qty 2
  Filled 2022-03-07 (×11): qty 1

## 2022-03-07 MED ORDER — ASPIRIN 81 MG PO TBEC
81.0000 mg | DELAYED_RELEASE_TABLET | Freq: Every day | ORAL | Status: DC
  Administered 2022-03-09 – 2022-03-21 (×13): 81 mg via ORAL
  Filled 2022-03-07 (×13): qty 1

## 2022-03-07 MED ORDER — ASPIRIN 300 MG RE SUPP
300.0000 mg | RECTAL | Status: AC
  Filled 2022-03-07: qty 1

## 2022-03-07 MED ORDER — ONDANSETRON HCL 4 MG/2ML IJ SOLN
4.0000 mg | Freq: Four times a day (QID) | INTRAMUSCULAR | Status: DC | PRN
  Administered 2022-03-08: 4 mg via INTRAVENOUS
  Filled 2022-03-07: qty 2

## 2022-03-07 NOTE — ED Notes (Signed)
Pt calm and cooperative at this time.

## 2022-03-07 NOTE — ED Notes (Signed)
All 4 copies of IVC paperwork given to Rose, NS.

## 2022-03-07 NOTE — ED Notes (Signed)
Updated pt's legal guardian.

## 2022-03-07 NOTE — Assessment & Plan Note (Signed)
Baseline appears to be around 1.4-1.5, stable Continue to monitor

## 2022-03-07 NOTE — ED Notes (Signed)
Notified phlebotomy of need for blood draw

## 2022-03-07 NOTE — ED Notes (Addendum)
Belongings inventoried. Valuables sent to security. See patient belongings assessment. Belongings placed in locker #6.

## 2022-03-07 NOTE — Assessment & Plan Note (Signed)
States she only smokes a few cigarettes, declines patch

## 2022-03-07 NOTE — Assessment & Plan Note (Signed)
No signs of exacerbation  Continue SABA prn

## 2022-03-07 NOTE — ED Notes (Signed)
Pt making 1st phone call. Pt was given the paper explaining visitation hours, snacks, meals, phone calls, etc.

## 2022-03-07 NOTE — ED Notes (Signed)
when you get a chance the daug. would like to speak to you  [id number]  [phone]

## 2022-03-07 NOTE — ED Notes (Signed)
Pt verbally aggressive with this RN saying she doesn't want me touching her stuff because she doesn't want to catch any diseases. Pt said "you probably have the gonorrhea and all that stuff". Pt also calling this RN names and saying RN was a "dike" because this RN had to get close to her to give her the zyprexa.

## 2022-03-07 NOTE — Assessment & Plan Note (Addendum)
TSH in 04/2021 wnl Not taking any medication, check TSH

## 2022-03-07 NOTE — Assessment & Plan Note (Addendum)
Psychiatry consult
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
## 2022-03-07 NOTE — Assessment & Plan Note (Signed)
Lipid panel from 04/2021 showed LDL of 80 Repeat fasting lipid panel in AM Continue crestor '40mg'$ 

## 2022-03-07 NOTE — Assessment & Plan Note (Addendum)
Blood pressure have been normotensive Continue toprol for now Not taking any other medication and unsure if taking her toprol

## 2022-03-07 NOTE — ED Notes (Signed)
03/07/22:  Exam and Rec completed by Dr. Maryan Rued, copy faxed to [facility], copy sent to medical records, Original placed in folder for Magistrate, all 3 sets in Chart held in file stand-up at secretary desk in [facility]; informed RN

## 2022-03-07 NOTE — ED Notes (Signed)
Called staffing for sitter d/t pt IVC. Per staffing, "I will try to see if I can get someone. More than likely I will."
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
## 2022-03-07 NOTE — Assessment & Plan Note (Addendum)
71 year old female presenting to ED for IVC found to have elevated troponin from 615>1305 with no chest pain at this time. Complained of chest pain this morning and had called EMS, but refused to go to the hospital.  -obs to progressive -cardiology consulted  -started on heparin gtt -check echo -give ASA now and then '81mg'$  daily  -continue crestor '40mg'$  -continue toprol-xl '50mg'$  daily  -trend troponin

## 2022-03-07 NOTE — Assessment & Plan Note (Addendum)
A1C of 8.2 in 04/2021, repeat pending Hold farxiga SSI and accuchecks qac/hs

## 2022-03-07 NOTE — Progress Notes (Signed)
ANTICOAGULATION CONSULT NOTE - Initial Consult  Pharmacy Consult for Heparin Indication: chest pain/ACS  Allergies  Allergen Reactions   Citrus Anaphylaxis and Itching   Fish Allergy Anaphylaxis    Cod   Shellfish Allergy Shortness Of Breath and Other (See Comments)    "Affects thyroid" also   Adhesive [Tape] Other (See Comments)    Must have paper tape only   Atorvastatin Other (See Comments)   Ibuprofen Swelling    Face swells   Latex Dermatitis   Lipitor [Atorvastatin Calcium] Other (See Comments)    Body aches   Lisinopril Other (See Comments)    PER DR. Melvyn Novas (not recalled by patient)   Other Nausea Only and Other (See Comments)    Collards (gas, too)   Codeine Rash   Tramadol Palpitations    Patient Measurements:   Heparin Dosing Weight: 71.3kg   Vital Signs: Temp: 98.8 F (37.1 C) (09/04 1018) Temp Source: Oral (09/04 1018) BP: 115/77 (09/04 1400) Pulse Rate: 65 (09/04 1400)  Labs: Recent Labs    03/07/22 1047 03/07/22 1251  HGB 11.7*  --   HCT 36.3  --   PLT 213  --   CREATININE 1.37*  --   CKTOTAL  --  251*  TROPONINIHS 615* 1,305*    CrCl cannot be calculated (Unknown ideal weight.).   Medical History: Past Medical History:  Diagnosis Date   Abscessed tooth 02/09/2020   Acute on chronic congestive heart failure ([place])    Adenomatous colon polyp    Arthritis    "real bad; all over" (07/12/2017)   Asthma    Atrial fibrillation ([place])    BIPOLAR DISORDER 08/31/2006   Qualifier: Diagnosis of  By: Dorathy Daft MD, Marjory Lies     CHRONIC KIDNEY DISEASE STAGE II (MILD) 09/14/2009   Annotation: eGFR 38 Qualifier: Diagnosis of  By: Jess Barters MD, [name]     Chronic lower back pain    Congestive heart failure (CHF) ([place])    COPD (chronic obstructive pulmonary disease) ([place]) 09/23/2010   Diagnosed at [facility] in 2008 (Dr. Annamaria Boots)    DM (diabetes mellitus) type II controlled with renal manifestation ([place]) 08/31/2006   Qualifier: Diagnosis of  By: Dorathy Daft MD, Marjory Lies      Dyspnea    "all my life; since 6th grade" (07/12/2017)   Fatty liver    HYPERCHOLESTEROLEMIA 08/31/2006   Intolerance to Lipitor OK on Crestor but medicaid no longer covering     HYPERTENSION, BENIGN SYSTEMIC 08/31/2006   Qualifier: Diagnosis of  By: Dorathy Daft MD, Aaron     Hypokalemia    HYPOTHYROIDISM, UNSPECIFIED 08/31/2006   Qualifier: Diagnosis of  By: Dorathy Daft MD, Marjory Lies     Leg swelling 03/14/2018   Pneumonia    "3 times" (07/12/2017)   Pulmonary nodule    Renal cyst    Schizophrenia ([place])    Scoliosis    Stomach problems    Thyroid disorder     Assessment: 71 yo female presented on 03/07/22. Pharmacy consulted for heparin for ACS. No anticoagulant prior to admission.    Goal of Therapy:  Heparin level 0.3-0.7 units/ml Monitor platelets by anticoagulation protocol: Yes   Plan:  Heparin 4000 unit bolus followed by heparin 850 units/hr  Check 8 hr heparin level  Monitor heparin level, CBC and s/s of bleeding daily   Cristela Felt, PharmD, BCPS Clinical Pharmacist 03/07/2022 2:18 PM

## 2022-03-07 NOTE — Assessment & Plan Note (Signed)
Check magnesium Replete and trend
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
## 2022-03-07 NOTE — Assessment & Plan Note (Addendum)
Echo in 2019: grade 1 DD. EF 50-55%  No sign of volume overload Strict I/O F/u with echo
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
## 2022-03-07 NOTE — ED Notes (Signed)
Updated pt's daughter, [name], on plan of care.
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
## 2022-03-08 ENCOUNTER — Telehealth: Payer: Self-pay

## 2022-03-08 ENCOUNTER — Observation Stay (HOSPITAL_COMMUNITY): Payer: Medicare Other

## 2022-03-08 DIAGNOSIS — Z7984 Long term (current) use of oral hypoglycemic drugs: Secondary | ICD-10-CM | POA: Diagnosis not present

## 2022-03-08 DIAGNOSIS — Z7982 Long term (current) use of aspirin: Secondary | ICD-10-CM | POA: Diagnosis not present

## 2022-03-08 DIAGNOSIS — Z79899 Other long term (current) drug therapy: Secondary | ICD-10-CM | POA: Diagnosis not present

## 2022-03-08 DIAGNOSIS — Z7901 Long term (current) use of anticoagulants: Secondary | ICD-10-CM | POA: Diagnosis not present

## 2022-03-08 DIAGNOSIS — I5020 Unspecified systolic (congestive) heart failure: Secondary | ICD-10-CM | POA: Diagnosis not present

## 2022-03-08 DIAGNOSIS — Z888 Allergy status to other drugs, medicaments and biological substances status: Secondary | ICD-10-CM | POA: Diagnosis not present

## 2022-03-08 DIAGNOSIS — Z800 Family history of malignant neoplasm of digestive organs: Secondary | ICD-10-CM | POA: Diagnosis not present

## 2022-03-08 DIAGNOSIS — Z794 Long term (current) use of insulin: Secondary | ICD-10-CM | POA: Diagnosis not present

## 2022-03-08 DIAGNOSIS — Z886 Allergy status to analgesic agent status: Secondary | ICD-10-CM | POA: Diagnosis not present

## 2022-03-08 DIAGNOSIS — I480 Paroxysmal atrial fibrillation: Secondary | ICD-10-CM | POA: Diagnosis present

## 2022-03-08 DIAGNOSIS — E1122 Type 2 diabetes mellitus with diabetic chronic kidney disease: Secondary | ICD-10-CM | POA: Diagnosis present

## 2022-03-08 DIAGNOSIS — Z803 Family history of malignant neoplasm of breast: Secondary | ICD-10-CM | POA: Diagnosis not present

## 2022-03-08 DIAGNOSIS — J449 Chronic obstructive pulmonary disease, unspecified: Secondary | ICD-10-CM | POA: Diagnosis present

## 2022-03-08 DIAGNOSIS — E039 Hypothyroidism, unspecified: Secondary | ICD-10-CM | POA: Diagnosis present

## 2022-03-08 DIAGNOSIS — I214 Non-ST elevation (NSTEMI) myocardial infarction: Secondary | ICD-10-CM | POA: Diagnosis present

## 2022-03-08 DIAGNOSIS — I10 Essential (primary) hypertension: Secondary | ICD-10-CM | POA: Diagnosis not present

## 2022-03-08 DIAGNOSIS — E1165 Type 2 diabetes mellitus with hyperglycemia: Secondary | ICD-10-CM | POA: Diagnosis present

## 2022-03-08 DIAGNOSIS — N182 Chronic kidney disease, stage 2 (mild): Secondary | ICD-10-CM | POA: Diagnosis present

## 2022-03-08 DIAGNOSIS — Z86010 Personal history of colonic polyps: Secondary | ICD-10-CM | POA: Diagnosis not present

## 2022-03-08 DIAGNOSIS — N1832 Chronic kidney disease, stage 3b: Secondary | ICD-10-CM

## 2022-03-08 DIAGNOSIS — J4489 Other specified chronic obstructive pulmonary disease: Secondary | ICD-10-CM | POA: Diagnosis present

## 2022-03-08 DIAGNOSIS — E785 Hyperlipidemia, unspecified: Secondary | ICD-10-CM | POA: Diagnosis not present

## 2022-03-08 DIAGNOSIS — Z885 Allergy status to narcotic agent status: Secondary | ICD-10-CM | POA: Diagnosis not present

## 2022-03-08 DIAGNOSIS — I4891 Unspecified atrial fibrillation: Secondary | ICD-10-CM | POA: Diagnosis present

## 2022-03-08 DIAGNOSIS — I130 Hypertensive heart and chronic kidney disease with heart failure and stage 1 through stage 4 chronic kidney disease, or unspecified chronic kidney disease: Secondary | ICD-10-CM | POA: Diagnosis present

## 2022-03-08 DIAGNOSIS — Z20822 Contact with and (suspected) exposure to covid-19: Secondary | ICD-10-CM | POA: Diagnosis present

## 2022-03-08 DIAGNOSIS — I5032 Chronic diastolic (congestive) heart failure: Secondary | ICD-10-CM | POA: Diagnosis not present

## 2022-03-08 DIAGNOSIS — F17210 Nicotine dependence, cigarettes, uncomplicated: Secondary | ICD-10-CM | POA: Diagnosis present

## 2022-03-08 DIAGNOSIS — E7800 Pure hypercholesterolemia, unspecified: Secondary | ICD-10-CM | POA: Diagnosis present

## 2022-03-08 DIAGNOSIS — K760 Fatty (change of) liver, not elsewhere classified: Secondary | ICD-10-CM | POA: Diagnosis present

## 2022-03-08 DIAGNOSIS — K219 Gastro-esophageal reflux disease without esophagitis: Secondary | ICD-10-CM | POA: Diagnosis present

## 2022-03-08 DIAGNOSIS — F250 Schizoaffective disorder, bipolar type: Secondary | ICD-10-CM | POA: Diagnosis present

## 2022-03-08 DIAGNOSIS — E876 Hypokalemia: Secondary | ICD-10-CM | POA: Diagnosis present

## 2022-03-08 DIAGNOSIS — E119 Type 2 diabetes mellitus without complications: Secondary | ICD-10-CM | POA: Diagnosis not present

## 2022-03-08 DIAGNOSIS — Z91013 Allergy to seafood: Secondary | ICD-10-CM | POA: Diagnosis not present

## 2022-03-08 DIAGNOSIS — Z91040 Latex allergy status: Secondary | ICD-10-CM | POA: Diagnosis not present

## 2022-03-08 DIAGNOSIS — I5042 Chronic combined systolic (congestive) and diastolic (congestive) heart failure: Secondary | ICD-10-CM | POA: Diagnosis present

## 2022-03-08 LAB — LIPID PANEL
Cholesterol: 215 mg/dL — ABNORMAL HIGH (ref 0–200)
HDL: 43 mg/dL (ref >40)
LDL Cholesterol: 144 mg/dL — ABNORMAL HIGH (ref 0–99)
Total CHOL/HDL Ratio: 5 RATIO
Triglycerides: 139 mg/dL (ref <150)
VLDL: 28 mg/dL (ref 0–40)

## 2022-03-08 LAB — COMPREHENSIVE METABOLIC PANEL
ALT: 14 U/L (ref 0–44)
AST: 21 U/L (ref 15–41)
Albumin: 3.1 g/dL — ABNORMAL LOW (ref 3.5–5.0)
Alkaline Phosphatase: 76 U/L (ref 38–126)
Anion gap: 8 (ref 5–15)
BUN: 19 mg/dL (ref 8–23)
CO2: 19 mmol/L — ABNORMAL LOW (ref 22–32)
Calcium: 8.9 mg/dL (ref 8.9–10.3)
Chloride: 112 mmol/L — ABNORMAL HIGH (ref 98–111)
Creatinine, Ser: 1.45 mg/dL — ABNORMAL HIGH (ref 0.44–1.00)
GFR, Estimated: 39 mL/min — ABNORMAL LOW (ref >60)
Glucose, Bld: 164 mg/dL — ABNORMAL HIGH (ref 70–99)
Potassium: 3.6 mmol/L (ref 3.5–5.1)
Sodium: 139 mmol/L (ref 135–145)
Total Bilirubin: <0.1 mg/dL — ABNORMAL LOW (ref 0.3–1.2)
Total Protein: 6.7 g/dL (ref 6.5–8.1)

## 2022-03-08 LAB — CBC
HCT: 35.5 % — ABNORMAL LOW (ref 36.0–46.0)
Hemoglobin: 11.4 g/dL — ABNORMAL LOW (ref 12.0–15.0)
MCH: 32 pg (ref 26.0–34.0)
MCHC: 32.1 g/dL (ref 30.0–36.0)
MCV: 99.7 fL (ref 80.0–100.0)
Platelets: 222 10*3/uL (ref 150–400)
RBC: 3.56 MIL/uL — ABNORMAL LOW (ref 3.87–5.11)
RDW: 15.3 % (ref 11.5–15.5)
WBC: 7 10*3/uL (ref 4.0–10.5)
nRBC: 0 % (ref 0.0–0.2)

## 2022-03-08 LAB — ECHOCARDIOGRAM COMPLETE
Area-P 1/2: 3.08 cm2
Height: 65 in
MV M vel: 3.19 m/s
MV Peak grad: 40.7 mmHg
S' Lateral: 2.6 cm
Weight: 2515.01 oz

## 2022-03-08 LAB — SURGICAL PCR SCREEN
MRSA, PCR: NEGATIVE
Staphylococcus aureus: NEGATIVE

## 2022-03-08 LAB — GLUCOSE, CAPILLARY
Glucose-Capillary: 96 mg/dL (ref 70–99)
Glucose-Capillary: 189 mg/dL — ABNORMAL HIGH (ref 70–99)

## 2022-03-08 LAB — CBG MONITORING, ED: Glucose-Capillary: 151 mg/dL — ABNORMAL HIGH (ref 70–99)

## 2022-03-08 LAB — HEPARIN LEVEL (UNFRACTIONATED)
Heparin Unfractionated: 1.08 IU/mL — ABNORMAL HIGH (ref 0.30–0.70)
Heparin Unfractionated: 0.17 IU/mL — ABNORMAL LOW (ref 0.30–0.70)

## 2022-03-08 LAB — VITAMIN B12: Vitamin B-12: 319 pg/mL (ref 180–914)

## 2022-03-08 MED ORDER — HALOPERIDOL LACTATE 5 MG/ML IJ SOLN
4.0000 mg | Freq: Four times a day (QID) | INTRAMUSCULAR | Status: DC | PRN
  Administered 2022-03-08 – 2022-03-09 (×3): 4 mg via INTRAVENOUS
  Filled 2022-03-08 (×5): qty 1

## 2022-03-08 MED ORDER — HEPARIN (PORCINE) 25000 UT/250ML-% IV SOLN
1050.0000 [IU]/h | INTRAVENOUS | Status: DC
  Administered 2022-03-08: 900 [IU]/h via INTRAVENOUS

## 2022-03-08 MED ORDER — LORAZEPAM 1 MG PO TABS: 1.0000 mg | ORAL_TABLET | Freq: Four times a day (QID) | ORAL | Status: DC | PRN

## 2022-03-08 MED ORDER — LORAZEPAM 2 MG/ML IJ SOLN: 1.0000 mg | Freq: Four times a day (QID) | INTRAMUSCULAR | Status: DC | PRN

## 2022-03-08 MED ORDER — POTASSIUM CHLORIDE CRYS ER 20 MEQ PO TBCR: 20.0000 meq | EXTENDED_RELEASE_TABLET | Freq: Once | ORAL | Status: DC

## 2022-03-08 MED ORDER — LORAZEPAM 1 MG PO TABS
1.0000 mg | ORAL_TABLET | Freq: Two times a day (BID) | ORAL | Status: DC
  Filled 2022-03-08: qty 1

## 2022-03-08 MED ORDER — LORAZEPAM 2 MG/ML IJ SOLN
1.0000 mg | Freq: Four times a day (QID) | INTRAMUSCULAR | Status: DC | PRN
  Administered 2022-03-08: 1 mg via INTRAVENOUS
  Filled 2022-03-08: qty 1

## 2022-03-08 NOTE — Telephone Encounter (Signed)
VOB submitted for Monovisc, bilateral knee  
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
## 2022-03-08 NOTE — ED Notes (Signed)
Provider at bedside

## 2022-03-08 NOTE — Progress Notes (Signed)
[facility] for Heparin Indication: chest pain/ACS  Allergies  Allergen Reactions   Citrus Anaphylaxis and Itching   Fish Allergy Anaphylaxis    Cod   Shellfish Allergy Shortness Of Breath and Other (See Comments)    "Affects thyroid" also   Adhesive [Tape] Other (See Comments)    Must have paper tape only   Atorvastatin Other (See Comments)    Body aches   Ibuprofen Swelling    Face swells   Latex Dermatitis   Lipitor [Atorvastatin Calcium] Other (See Comments)    Body aches   Lisinopril Other (See Comments)    PER DR. Melvyn Novas (not recalled by patient)   Other Nausea Only and Other (See Comments)    Collards (gas, too)   Codeine Rash   Tramadol Palpitations    Patient Measurements: Height: '5\' 5"'  (165.1 cm) Weight: 71.3 kg (157 lb 3 oz) IBW/kg (Calculated) : 57 Heparin Dosing Weight: 71.3kg   Vital Signs: Temp: 97.2 F (36.2 C) (09/04 2324) Temp Source: Temporal (09/04 2324) BP: 121/60 (09/04 2245) Pulse Rate: 58 (09/04 2245)  Labs: Recent Labs    03/07/22 1047 03/07/22 1251 03/07/22 1630 03/07/22 2322  HGB 11.7*  --   --   --   HCT 36.3  --   --   --   PLT 213  --   --   --   HEPARINUNFRC  --   --   --  0.29*  CREATININE 1.37*  --   --   --   CKTOTAL  --  251*  --   --   TROPONINIHS 615* 1,305* 2,475*  --      Estimated Creatinine Clearance: 37.8 mL/min (A) (by C-G formula based on SCr of 1.37 mg/dL (H)).   Medical History: Past Medical History:  Diagnosis Date   Abscessed tooth 02/09/2020   Acute on chronic congestive heart failure ([place])    Adenomatous colon polyp    Arthritis    "real bad; all over" (07/12/2017)   Asthma    Atrial fibrillation ([place])    BIPOLAR DISORDER 08/31/2006   Qualifier: Diagnosis of  By: Dorathy Daft MD, Marjory Lies     CHRONIC KIDNEY DISEASE STAGE II (MILD) 09/14/2009   Annotation: eGFR 64 Qualifier: Diagnosis of  By: Jess Barters MD, [name]     Chronic lower back pain    Congestive heart failure (CHF)  ([place])    COPD (chronic obstructive pulmonary disease) ([place]) 09/23/2010   Diagnosed at [facility] in 2008 (Dr. Annamaria Boots)    DM (diabetes mellitus) type II controlled with renal manifestation ([place]) 08/31/2006   Qualifier: Diagnosis of  By: Dorathy Daft MD, Marjory Lies     Dyspnea    "all my life; since 6th grade" (07/12/2017)   Fatty liver    HYPERCHOLESTEROLEMIA 08/31/2006   Intolerance to Lipitor OK on Crestor but medicaid no longer covering     HYPERTENSION, BENIGN SYSTEMIC 08/31/2006   Qualifier: Diagnosis of  By: Dorathy Daft MD, Aaron     Hypokalemia    HYPOTHYROIDISM, UNSPECIFIED 08/31/2006   Qualifier: Diagnosis of  By: Dorathy Daft MD, Marjory Lies     Leg swelling 03/14/2018   Pneumonia    "3 times" (07/12/2017)   Pulmonary nodule    Renal cyst    Schizophrenia ([place])    Scoliosis    Stomach problems    Thyroid disorder     Assessment: 71 yo female presented on 03/07/22. Pharmacy consulted for heparin for ACS. No anticoagulant prior to admission.  Initial heparin level slightly low: 0.29 on 850 units/hr, RN reports patient briefly lost IV access ~3 hours ago or overt s/sx of bleeding.   Goal of Therapy:  Heparin level 0.3-0.7 units/ml Monitor platelets by anticoagulation protocol: Yes   Plan:  Increase heparin gtt to 1000 units/hr  Check 8 hr heparin level  Monitor heparin level, CBC and s/s of bleeding daily   Georga Bora, PharmD Clinical Pharmacist 03/08/2022 12:01 AM Please check AMION for all [place] numbers
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
## 2022-03-08 NOTE — Progress Notes (Signed)
ANTICOAGULATION CONSULT NOTE - Follow Up Consult  Pharmacy Consult for Heparin Indication: chest pain/ACS  Allergies  Allergen Reactions   Citrus Anaphylaxis and Itching   Fish Allergy Anaphylaxis    Cod   Shellfish Allergy Shortness Of Breath and Other (See Comments)    "Affects thyroid" also   Adhesive [Tape] Other (See Comments)    Must have paper tape only   Atorvastatin Other (See Comments)    Body aches   Ibuprofen Swelling    Face swells   Latex Dermatitis   Lipitor [Atorvastatin Calcium] Other (See Comments)    Body aches   Lisinopril Other (See Comments)    PER DR. Melvyn Novas (not recalled by patient)   Other Nausea Only and Other (See Comments)    Collards (gas, too)   Codeine Rash   Tramadol Palpitations    Patient Measurements: Height: _0  (165.1 cm) Weight: 71.3 kg (157 lb 3 oz) IBW/kg (Calculated) : 57 Heparin Dosing Weight: 71.3kg   Vital Signs: Temp: 98.2 F (36.8 C) (09/05 0724) Temp Source: Axillary (09/05 0724) BP: 110/94 (09/05 0900) Pulse Rate: 68 (09/05 0900)  Labs: Recent Labs    03/07/22 1047 03/07/22 1251 03/07/22 1630 03/07/22 2322 03/08/22 0915 03/08/22 0917  HGB 11.7*  --   --   --  11.4*  --   HCT 36.3  --   --   --  35.5*  --   PLT 213  --   --   --  222  --   HEPARINUNFRC  --   --   --  0.29*  --  1.08*  CREATININE 1.37*  --   --   --   --   --   CKTOTAL  --  251*  --   --   --   --   TROPONINIHS 615* 1,305* 2,475*  --   --   --      Estimated Creatinine Clearance: 37.8 mL/min (A) (by C-G formula based on SCr of 1.37 mg/dL (H)).   Medical History: Past Medical History:  Diagnosis Date   Abscessed tooth 02/09/2020   Acute on chronic congestive heart failure ([place])    Adenomatous colon polyp    Arthritis    "real bad; all over" (07/12/2017)   Asthma    Atrial fibrillation ([place])    BIPOLAR DISORDER 08/31/2006   Qualifier: Diagnosis of  By: Dorathy Daft MD, Marjory Lies     CHRONIC KIDNEY DISEASE STAGE II (MILD) 09/14/2009    Annotation: eGFR 54 Qualifier: Diagnosis of  By: Jess Barters MD, [name]     Chronic lower back pain    Congestive heart failure (CHF) ([place])    COPD (chronic obstructive pulmonary disease) ([place]) 09/23/2010   Diagnosed at [facility] in 2008 (Dr. Annamaria Boots)    DM (diabetes mellitus) type II controlled with renal manifestation ([place]) 08/31/2006   Qualifier: Diagnosis of  By: Dorathy Daft MD, Marjory Lies     Dyspnea    "all my life; since 6th grade" (07/12/2017)   Fatty liver    HYPERCHOLESTEROLEMIA 08/31/2006   Intolerance to Lipitor OK on Crestor but medicaid no longer covering     HYPERTENSION, BENIGN SYSTEMIC 08/31/2006   Qualifier: Diagnosis of  By: Dorathy Daft MD, Aaron     Hypokalemia    HYPOTHYROIDISM, UNSPECIFIED 08/31/2006   Qualifier: Diagnosis of  By: Dorathy Daft MD, Marjory Lies     Leg swelling 03/14/2018   Pneumonia    "3 times" (07/12/2017)   Pulmonary nodule    Renal cyst  Schizophrenia ([place])    Scoliosis    Stomach problems    Thyroid disorder     Assessment: 71 yo female presented on 03/07/22. Pharmacy consulted for heparin for ACS. No anticoagulant prior to admission.   Heparin level of 1.08 is supratherapeutic on heparin 1000 units/hr. Level drawn appropriately per RN. No bleeding noted. Hgb 11.4. Plts wnl.    Goal of Therapy:  Heparin level 0.3-0.7 units/ml Monitor platelets by anticoagulation protocol: Yes   Plan:  Hold heparin infusion for 1 hr  Resume heparin at 900 units/hr Monitor heparin level, CBC and s/s of bleeding daily   Cristela Felt, PharmD, BCPS Clinical Pharmacist 03/08/2022 9:51 AM
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
## 2022-03-08 NOTE — Progress Notes (Signed)
ANTICOAGULATION CONSULT NOTE - Follow Up Consult  Pharmacy Consult for Heparin Indication: chest pain/ACS  Allergies  Allergen Reactions   Citrus Anaphylaxis and Itching   Fish Allergy Anaphylaxis    Cod   Shellfish Allergy Shortness Of Breath and Other (See Comments)    "Affects thyroid" also   Adhesive [Tape] Other (See Comments)    Must have paper tape only   Atorvastatin Other (See Comments)    Body aches   Ibuprofen Swelling    Face swells   Latex Dermatitis   Lipitor [Atorvastatin Calcium] Other (See Comments)    Body aches   Lisinopril Other (See Comments)    PER DR. Melvyn Novas (not recalled by patient)   Other Nausea Only and Other (See Comments)    Collards (gas, too)   Codeine Rash   Tramadol Palpitations    Patient Measurements: Height: _0  (165.1 cm) Weight: 71.3 kg (157 lb 3 oz) IBW/kg (Calculated) : 57 Heparin Dosing Weight: 71.3kg   Vital Signs: Temp: 97.8 F (36.6 C) (09/05 1952) Temp Source: Oral (09/05 1952) BP: 143/69 (09/05 1952) Pulse Rate: 60 (09/05 1952)  Labs: Recent Labs    03/07/22 1047 03/07/22 1251 03/07/22 1630 03/07/22 2322 03/08/22 0915 03/08/22 0917 03/08/22 1923  HGB 11.7*  --   --   --  11.4*  --   --   HCT 36.3  --   --   --  35.5*  --   --   PLT 213  --   --   --  222  --   --   HEPARINUNFRC  --   --   --  0.29*  --  1.08* 0.17*  CREATININE 1.37*  --   --   --  1.45*  --   --   CKTOTAL  --  251*  --   --   --   --   --   TROPONINIHS 615* 1,305* 2,475*  --   --   --   --      Estimated Creatinine Clearance: 35.7 mL/min (A) (by C-G formula based on SCr of 1.45 mg/dL (H)).   Medical History: Past Medical History:  Diagnosis Date   Abscessed tooth 02/09/2020   Acute on chronic congestive heart failure ([place])    Adenomatous colon polyp    Arthritis    "real bad; all over" (07/12/2017)   Asthma    Atrial fibrillation ([place])    BIPOLAR DISORDER 08/31/2006   Qualifier: Diagnosis of  By: Dorathy Daft MD, Marjory Lies     CHRONIC  KIDNEY DISEASE STAGE II (MILD) 09/14/2009   Annotation: eGFR 64 Qualifier: Diagnosis of  By: Jess Barters MD, [name]     Chronic lower back pain    Congestive heart failure (CHF) ([place])    COPD (chronic obstructive pulmonary disease) ([place]) 09/23/2010   Diagnosed at [facility] in [facility] (Dr. Annamaria Boots)    DM (diabetes mellitus) type II controlled with renal manifestation ([place]) 08/31/2006   Qualifier: Diagnosis of  By: Dorathy Daft MD, Marjory Lies     Dyspnea    "all my life; since 6th grade" (07/12/2017)   Fatty liver    HYPERCHOLESTEROLEMIA 08/31/2006   Intolerance to Lipitor OK on Crestor but medicaid no longer covering     HYPERTENSION, BENIGN SYSTEMIC 08/31/2006   Qualifier: Diagnosis of  By: Dorathy Daft MD, Aaron     Hypokalemia    HYPOTHYROIDISM, UNSPECIFIED 08/31/2006   Qualifier: Diagnosis of  By: Dorathy Daft MD, Marjory Lies     Leg swelling 03/14/2018  Pneumonia    "3 times" (07/12/2017)   Pulmonary nodule    Renal cyst    Schizophrenia ([place])    Scoliosis    Stomach problems    Thyroid disorder     Assessment: 71 yo female presented on 03/07/22. Pharmacy consulted for heparin for ACS. No anticoagulant prior to admission.   Heparin level came back subtherapeutic tonight. Elevated level earlier today may not have been accurate. We will increase rate and check level in AM.  Goal of Therapy:  Heparin level 0.3-0.7 units/ml Monitor platelets by anticoagulation protocol: Yes   Plan:  Increase heparin to 1050 units/hr HL in AM Monitor heparin level, CBC and s/s of bleeding daily   Onnie Boer, PharmD, [place], AAHIVP, CPP Infectious Disease Pharmacist 03/08/2022 8:23 PM

## 2022-03-08 NOTE — ED Notes (Signed)
Pt back in bed.

## 2022-03-08 NOTE — ED Notes (Signed)
ED TO INPATIENT HANDOFF REPORT  ED Nurse Name and Phone #: Eartha Inch [phone]  S Name/Age/Gender [name] Long 71 y.o. female Room/Bed: TRACC/TRACC  Code Status   Code Status: Full Code  Home/SNF/Other Pt is an IVC Patient oriented to: self, place, and time Is this baseline? No   Triage Complete: Triage complete  Chief Complaint NSTEMI (non-ST elevated myocardial infarction) [facility]) [I21.4]  Triage Note Patient escorted by [employer] as she was IVC'ed by her daughter. Patient with hx of schizophrenia and is not compliant with caring for herself. Patient was evaluated by EMS today but refused to be transported to hospital for evaluation of medical problems and under these circumstances daughter IVC'd patient.   Daughter is Kyoko Elsea who is her guardian: number is [phone]    Allergies Allergies  Allergen Reactions   Citrus Anaphylaxis and Itching   Fish Allergy Anaphylaxis    Cod   Shellfish Allergy Shortness Of Breath and Other (See Comments)    "Affects thyroid" also   Adhesive [Tape] Other (See Comments)    Must have paper tape only   Atorvastatin Other (See Comments)    Body aches   Ibuprofen Swelling    Face swells   Latex Dermatitis   Lipitor [Atorvastatin Calcium] Other (See Comments)    Body aches   Lisinopril Other (See Comments)    PER DR. Melvyn Novas (not recalled by patient)   Other Nausea Only and Other (See Comments)    Collards (gas, too)   Codeine Rash   Tramadol Palpitations    Level of Care/Admitting Diagnosis ED Disposition     ED Disposition  Admit   Condition  --   Parks: [place] [[id number]]  Level of Care: Progressive [102]  Admit to Progressive based on following criteria: CARDIOVASCULAR & THORACIC of moderate stability with acute coronary syndrome symptoms/low risk myocardial infarction/hypertensive urgency/arrhythmias/heart failure potentially compromising stability and stable post cardiovascular  intervention patients.  May place patient in observation at [facility] or [place] if equivalent level of care is available:: No  Covid Evaluation: Asymptomatic - no recent exposure (last 10 days) testing not required  Diagnosis: NSTEMI (non-ST elevated myocardial infarction) [facility]) [[id number]]  Admitting Physician: Orma Flaming [[id number]]  Attending Physician: Orma Flaming [[id number]]          B Medical/Surgery History Past Medical History:  Diagnosis Date   Abscessed tooth 02/09/2020   Acute on chronic congestive heart failure ([place])    Adenomatous colon polyp    Arthritis    "real bad; all over" (07/12/2017)   Asthma    Atrial fibrillation ([place])    BIPOLAR DISORDER 08/31/2006   Qualifier: Diagnosis of  By: Dorathy Daft MD, Marjory Lies     CHRONIC KIDNEY DISEASE STAGE II (MILD) 09/14/2009   Annotation: eGFR 20 Qualifier: Diagnosis of  By: Jess Barters MD, Erik     Chronic lower back pain    Congestive heart failure (CHF) (HCC)    COPD (chronic obstructive pulmonary disease) ([place]) 09/23/2010   Diagnosed at [facility] in 2008 (Dr. Annamaria Boots)    DM (diabetes mellitus) type II controlled with renal manifestation ([place]) 08/31/2006   Qualifier: Diagnosis of  By: Dorathy Daft MD, Marjory Lies     Dyspnea    "all my life; since 6th grade" (07/12/2017)   Fatty liver    HYPERCHOLESTEROLEMIA 08/31/2006   Intolerance to Lipitor OK on Crestor but medicaid no longer covering     HYPERTENSION, BENIGN SYSTEMIC 08/31/2006   Qualifier: Diagnosis of  By: Dorathy Daft MD, Aaron     Hypokalemia    HYPOTHYROIDISM, UNSPECIFIED 08/31/2006   Qualifier: Diagnosis of  By: Dorathy Daft MD, Marjory Lies     Leg swelling 03/14/2018   Pneumonia    "3 times" (07/12/2017)   Pulmonary nodule    Renal cyst    Schizophrenia ([place])    Scoliosis    Stomach problems    Thyroid disorder    Past Surgical History:  Procedure Laterality Date   CESAREAN SECTION     FOOT FRACTURE SURGERY Right    "steel plate in it"   FOOT SURGERY     " born w/dislocated  foot"   FRACTURE SURGERY     KNEE ARTHROSCOPY Right    TOE SURGERY Bilateral    "both pinky toes"   TONSILLECTOMY AND ADENOIDECTOMY       A IV Location/Drains/Wounds Patient Lines/Drains/Airways Status     Active Line/Drains/Airways     Name Placement date Placement time Site Days   Peripheral IV 03/07/22 22 G 1" Right;Posterior Forearm 03/07/22  2105  Forearm  1            Intake/Output Last 24 hours  Intake/Output Summary (Last 24 hours) at 03/08/2022 1003 Last data filed at 03/08/2022 1001 Gross per 24 hour  Intake 443.63 ml  Output 3 ml  Net 440.63 ml    Labs/Imaging Results for orders placed or performed during the hospital encounter of 03/07/22 (from the past 48 hour(s))  Comprehensive metabolic panel     Status: Abnormal   Collection Time: 03/07/22 10:47 AM  Result Value Ref Range   Sodium 138 135 - 145 mmol/L   Potassium 3.3 (L) 3.5 - 5.1 mmol/L   Chloride 109 98 - 111 mmol/L   CO2 18 (L) 22 - 32 mmol/L   Glucose, Bld 258 (H) 70 - 99 mg/dL    Comment: Glucose reference range applies only to samples taken after fasting for at least 8 hours.   BUN 15 8 - 23 mg/dL   Creatinine, Ser 1.37 (H) 0.44 - 1.00 mg/dL   Calcium 9.4 8.9 - 10.3 mg/dL   Total Protein 7.3 6.5 - 8.1 g/dL   Albumin 3.4 (L) 3.5 - 5.0 g/dL   AST 18 15 - 41 U/L   ALT 15 0 - 44 U/L   Alkaline Phosphatase 72 38 - 126 U/L   Total Bilirubin 0.3 0.3 - 1.2 mg/dL   GFR, Estimated 42 (L) >60 mL/min    Comment: (NOTE) Calculated using the CKD-EPI Creatinine Equation (2021)    Anion gap 11 5 - 15    Comment: Performed at [facility] [address], [place], [place] [address]  Ethanol     Status: None   Collection Time: 03/07/22 10:47 AM  Result Value Ref Range   Alcohol, Ethyl (B) <10 <10 mg/dL    Comment: (NOTE) Lowest detectable limit for serum alcohol is 10 mg/dL.  For medical purposes only. Performed at [facility], [place] [address], [place], [place] [address]   CBC with  Diff     Status: Abnormal   Collection Time: 03/07/22 10:47 AM  Result Value Ref Range   WBC 5.1 4.0 - 10.5 K/uL   RBC 3.70 (L) 3.87 - 5.11 MIL/uL   Hemoglobin 11.7 (L) 12.0 - 15.0 g/dL   HCT 36.3 36.0 - 46.0 %   MCV 98.1 80.0 - 100.0 fL   MCH 31.6 26.0 - 34.0 pg   MCHC 32.2 30.0 -  36.0 g/dL   RDW 15.0 11.5 - 15.5 %   Platelets 213 150 - 400 K/uL   nRBC 0.0 0.0 - 0.2 %   Neutrophils Relative % 83 %   Neutro Abs 4.2 1.7 - 7.7 K/uL   Lymphocytes Relative 14 %   Lymphs Abs 0.7 0.7 - 4.0 K/uL   Monocytes Relative 3 %   Monocytes Absolute 0.2 0.1 - 1.0 K/uL   Eosinophils Relative 0 %   Eosinophils Absolute 0.0 0.0 - 0.5 K/uL   Basophils Relative 0 %   Basophils Absolute 0.0 0.0 - 0.1 K/uL   Immature Granulocytes 0 %   Abs Immature Granulocytes 0.01 0.00 - 0.07 K/uL    Comment: Performed at [facility] [address] [place], [place] [address]  Troponin I (High Sensitivity)     Status: Abnormal   Collection Time: 03/07/22 10:47 AM  Result Value Ref Range   Troponin I (High Sensitivity) 615 (HH) <18 ng/L    Comment: CRITICAL RESULT CALLED TO, READ BACK BY AND VERIFIED WITH E. DIXON RN 03/07/22 '@1204'  BY JW (NOTE) Elevated high sensitivity troponin I (hsTnI) values and significant  changes across serial measurements may suggest ACS but many other  chronic and acute conditions are known to elevate hsTnI results.  Refer to the "Links" section for chest pain algorithms and additional  guidance. Performed at [facility], [place] [address] [place], [place] [address]   Urine rapid drug screen (hosp performed)     Status: None   Collection Time: 03/07/22 12:01 PM  Result Value Ref Range   Opiates NONE DETECTED NONE DETECTED   Cocaine NONE DETECTED NONE DETECTED   Benzodiazepines NONE DETECTED NONE DETECTED   Amphetamines NONE DETECTED NONE DETECTED   Tetrahydrocannabinol NONE DETECTED NONE DETECTED   Barbiturates NONE DETECTED NONE DETECTED    Comment: (NOTE) DRUG SCREEN  FOR MEDICAL PURPOSES ONLY.  IF CONFIRMATION IS NEEDED FOR ANY PURPOSE, NOTIFY LAB WITHIN 5 DAYS.  LOWEST DETECTABLE LIMITS FOR URINE DRUG SCREEN Drug Class                     Cutoff (ng/mL) Amphetamine and metabolites    1000 Barbiturate and metabolites    200 Benzodiazepine                 195 Tricyclics and metabolites     300 Opiates and metabolites        300 Cocaine and metabolites        300 THC                            50 Performed at [facility], [place] [address] [place], [place] [address]   Resp Panel by RT-PCR (Flu A&B, Covid) Anterior Nasal Swab     Status: None   Collection Time: 03/07/22 12:51 PM   Specimen: Anterior Nasal Swab  Result Value Ref Range   SARS Coronavirus 2 by RT PCR NEGATIVE NEGATIVE    Comment: (NOTE) SARS-CoV-2 target nucleic acids are NOT DETECTED.  The SARS-CoV-2 RNA is generally detectable in upper respiratory specimens during the acute phase of infection. The lowest concentration of SARS-CoV-2 viral copies this assay can detect is 138 copies/mL. A negative result does not preclude SARS-Cov-2 infection and should not be used as the sole basis for treatment or other patient management decisions. A negative result may occur with  improper specimen collection/handling,  submission of specimen other than nasopharyngeal swab, presence of viral mutation(s) within the areas targeted by this assay, and inadequate number of viral copies(<138 copies/mL). A negative result must be combined with clinical observations, patient history, and epidemiological information. The expected result is Negative.  Fact Sheet for Patients:  EntrepreneurPulse.com.au  Fact Sheet for Healthcare Providers:  IncredibleEmployment.be  This test is no t yet approved or cleared by the [place] FDA and  has been authorized for detection and/or diagnosis of SARS-CoV-2 by FDA under an Emergency Use Authorization (EUA). This EUA  will remain  in effect (meaning this test can be used) for the duration of the COVID-19 declaration under Section 564(b)(1) of the Act, 21 U.S.C.section 360bbb-3(b)(1), unless the authorization is terminated  or revoked sooner.       Influenza A by PCR NEGATIVE NEGATIVE   Influenza B by PCR NEGATIVE NEGATIVE    Comment: (NOTE) The Xpert Xpress SARS-CoV-2/FLU/RSV plus assay is intended as an aid in the diagnosis of influenza from Nasopharyngeal swab specimens and should not be used as a sole basis for treatment. Nasal washings and aspirates are unacceptable for Xpert Xpress SARS-CoV-2/FLU/RSV testing.  Fact Sheet for Patients: EntrepreneurPulse.com.au  Fact Sheet for Healthcare Providers: IncredibleEmployment.be  This test is not yet approved or cleared by the [place] FDA and has been authorized for detection and/or diagnosis of SARS-CoV-2 by FDA under an Emergency Use Authorization (EUA). This EUA will remain in effect (meaning this test can be used) for the duration of the COVID-19 declaration under Section 564(b)(1) of the Act, 21 U.S.C. section 360bbb-3(b)(1), unless the authorization is terminated or revoked.  Performed at [facility], [place] [address] [place], [place] [address]   Troponin I (High Sensitivity)     Status: Abnormal   Collection Time: 03/07/22 12:51 PM  Result Value Ref Range   Troponin I (High Sensitivity) 1,305 (HH) <18 ng/L    Comment: CRITICAL RESULT CALLED TO, READ BACK BY AND VERIFIED WITH JOANNA SOTO RN.'@1410'  ON 9.4.23 BY TCALDWELL MT. (NOTE) Elevated high sensitivity troponin I (hsTnI) values and significant  changes across serial measurements may suggest ACS but many other  chronic and acute conditions are known to elevate hsTnI results.  Refer to the "Links" section for chest pain algorithms and additional  guidance. Performed at [facility], [place] [address], [place], [place] [address]    CK     Status: Abnormal   Collection Time: 03/07/22 12:51 PM  Result Value Ref Range   Total CK 251 (H) 38 - 234 U/L    Comment: Performed at [facility], [place] [address] [place], [place] [address]  Magnesium     Status: None   Collection Time: 03/07/22  4:30 PM  Result Value Ref Range   Magnesium 2.0 1.7 - 2.4 mg/dL    Comment: Performed at [facility] [address] [place], [place] [address]  Brain natriuretic peptide     Status: Abnormal   Collection Time: 03/07/22  4:30 PM  Result Value Ref Range   B Natriuretic Peptide 163.1 (H) 0.0 - 100.0 pg/mL    Comment: Performed at [facility] [address], [place], [place] [address]  Hemoglobin A1c     Status: Abnormal   Collection Time: 03/07/22  4:30 PM  Result Value Ref Range   Hgb A1c MFr Bld 9.7 (H) 4.8 - 5.6 %    Comment: (NOTE) Pre diabetes:  5.7%-6.4%  Diabetes:              >6.4%  Glycemic control for   <7.0% adults with diabetes    Mean Plasma Glucose 231.69 mg/dL    Comment: Performed at [facility] [address] [place], [place] [address]  Troponin I (High Sensitivity)     Status: Abnormal   Collection Time: 03/07/22  4:30 PM  Result Value Ref Range   Troponin I (High Sensitivity) 2,475 (HH) <18 ng/L    Comment: DELTA CHECK NOTED CRITICAL VALUE NOTED. VALUE IS CONSISTENT WITH PREVIOUSLY REPORTED/CALLED VALUE (NOTE) Elevated high sensitivity troponin I (hsTnI) values and significant  changes across serial measurements may suggest ACS but many other  chronic and acute conditions are known to elevate hsTnI results.  Refer to the "Links" section for chest pain algorithms and additional  guidance. Performed at [facility], [place] [address], [place], [place] [address]   TSH     Status: None   Collection Time: 03/07/22  4:30 PM  Result Value Ref Range   TSH 0.471 0.350 - 4.500 uIU/mL    Comment: Performed by a 3rd Generation assay with a functional sensitivity of  <=0.01 uIU/mL. Performed at [facility], [place] [address], [place], [place] [address]   CBG monitoring, ED     Status: Abnormal   Collection Time: 03/07/22  7:38 PM  Result Value Ref Range   Glucose-Capillary 146 (H) 70 - 99 mg/dL    Comment: Glucose reference range applies only to samples taken after fasting for at least 8 hours.  Heparin level (unfractionated)     Status: Abnormal   Collection Time: 03/07/22 11:22 PM  Result Value Ref Range   Heparin Unfractionated 0.29 (L) 0.30 - 0.70 IU/mL    Comment: (NOTE) The clinical reportable range upper limit is being lowered to >1.10 to align with the FDA approved guidance for the current laboratory assay.  If heparin results are below expected values, and patient dosage has  been confirmed, suggest follow up testing of antithrombin III levels. Performed at [facility], [place] [address], [place], [place] [address]   Lipid panel     Status: Abnormal   Collection Time: 03/08/22 12:14 AM  Result Value Ref Range   Cholesterol 215 (H) 0 - 200 mg/dL   Triglycerides 139 <150 mg/dL   HDL 43 >40 mg/dL   Total CHOL/HDL Ratio 5.0 RATIO   VLDL 28 0 - 40 mg/dL   LDL Cholesterol 144 (H) 0 - 99 mg/dL    Comment:        Total Cholesterol/HDL:CHD Risk Coronary Heart Disease Risk Table                     Men   Women  1/2 Average Risk   3.4   3.3  Average Risk       5.0   4.4  2 X Average Risk   9.6   7.1  3 X Average Risk  23.4   11.0        Use the calculated Patient Ratio above and the CHD Risk Table to determine the patient's CHD Risk.        ATP III CLASSIFICATION (LDL):  <100     mg/dL   Optimal  100-129  mg/dL   Near or Above                    Optimal  130-159  mg/dL   Borderline  160-189  mg/dL   High  >190     mg/dL   Very High Performed at [facility] [address] [place], [place] [address]   Vitamin B12     Status: None   Collection Time: 03/08/22 12:14 AM  Result Value Ref Range   Vitamin B-12 319  180 - 914 pg/mL    Comment: (NOTE) This assay is not validated for testing neonatal or myeloproliferative syndrome specimens for Vitamin B12 levels. Performed at [facility], [place] [address], [place], [place] [address]   CBG monitoring, ED     Status: Abnormal   Collection Time: 03/08/22  7:13 AM  Result Value Ref Range   Glucose-Capillary 151 (H) 70 - 99 mg/dL    Comment: Glucose reference range applies only to samples taken after fasting for at least 8 hours.  CBC     Status: Abnormal   Collection Time: 03/08/22  9:15 AM  Result Value Ref Range   WBC 7.0 4.0 - 10.5 K/uL   RBC 3.56 (L) 3.87 - 5.11 MIL/uL   Hemoglobin 11.4 (L) 12.0 - 15.0 g/dL   HCT 35.5 (L) 36.0 - 46.0 %   MCV 99.7 80.0 - 100.0 fL   MCH 32.0 26.0 - 34.0 pg   MCHC 32.1 30.0 - 36.0 g/dL   RDW 15.3 11.5 - 15.5 %   Platelets 222 150 - 400 K/uL   nRBC 0.0 0.0 - 0.2 %    Comment: Performed at [facility], [place] [address] [place], [place] [address]  Comprehensive metabolic panel     Status: Abnormal   Collection Time: 03/08/22  9:15 AM  Result Value Ref Range   Sodium 139 135 - 145 mmol/L   Potassium 3.6 3.5 - 5.1 mmol/L   Chloride 112 (H) 98 - 111 mmol/L   CO2 19 (L) 22 - 32 mmol/L   Glucose, Bld 164 (H) 70 - 99 mg/dL    Comment: Glucose reference range applies only to samples taken after fasting for at least 8 hours.   BUN 19 8 - 23 mg/dL   Creatinine, Ser 1.45 (H) 0.44 - 1.00 mg/dL   Calcium 8.9 8.9 - 10.3 mg/dL   Total Protein 6.7 6.5 - 8.1 g/dL   Albumin 3.1 (L) 3.5 - 5.0 g/dL   AST 21 15 - 41 U/L   ALT 14 0 - 44 U/L   Alkaline Phosphatase 76 38 - 126 U/L   Total Bilirubin <0.1 (L) 0.3 - 1.2 mg/dL   GFR, Estimated 39 (L) >60 mL/min    Comment: (NOTE) Calculated using the CKD-EPI Creatinine Equation (2021)    Anion gap 8 5 - 15    Comment: Performed at [facility] [address], [place], [place] [address]  Heparin level (unfractionated)     Status: Abnormal   Collection Time:  03/08/22  9:17 AM  Result Value Ref Range   Heparin Unfractionated 1.08 (H) 0.30 - 0.70 IU/mL    Comment: (NOTE) The clinical reportable range upper limit is being lowered to >1.10 to align with the FDA approved guidance for the current laboratory assay.  If heparin results are below expected values, and patient dosage has  been confirmed, suggest follow up testing of antithrombin III levels. Performed at [facility], [place] [address], [place], [place] [address]    *Note: Due to a large number of results and/or encounters for the  requested time period, some results have not been displayed. A complete set of results can be found in Results Review.   DG Chest 1 View  Result Date: 03/07/2022 CLINICAL DATA:  Chest pain EXAM: CHEST  1 VIEW COMPARISON:  11/02/2021. FINDINGS: Cardiopericardial silhouette is at upper limits of normal for size. There is pulmonary vascular congestion without overt pulmonary edema. Subsegmental atelectasis noted in the lower lungs bilaterally, left greater than right. The visualized bony structures of the thorax are unremarkable. Thoracolumbar scoliosis evident. IMPRESSION: Cardiomegaly with vascular congestion and basilar atelectasis. Electronically Signed   By: Misty Stanley M.D.   On: 03/07/2022 12:17    Pending Labs Unresulted Labs (From admission, onward)     Start     Ordered   03/09/22 0500  Heparin level (unfractionated)  Daily at 5am,   R      03/08/22 0006   03/08/22 1900  Heparin level (unfractionated)  Once-Timed,   TIMED        03/08/22 1000   03/08/22 0500  CBC  Daily at 5am,   R      03/07/22 1423   03/08/22 0500  Lipoprotein A (LPA)  Tomorrow morning,   R        03/07/22 1645   03/08/22 0030  CBC  Once,   R        03/08/22 0030   03/07/22 2338  Vitamin B1  Once,   R        03/07/22 2338            Vitals/Pain Today's Vitals   03/08/22 0645 03/08/22 0715 03/08/22 0724 03/08/22 0900  BP: (!) 110/55 (!) 110/57  (!) 110/94  Pulse:  (!) 57 63  68  Resp: 16 (!) 21  16  Temp:   98.2 F (36.8 C)   TempSrc:   Axillary   SpO2: 100% 100%  98%  Weight:      Height:      PainSc:        Isolation Precautions No active isolations  Medications Medications  aspirin EC tablet 81 mg (81 mg Oral Not Given 03/08/22 1000)  nitroGLYCERIN (NITROSTAT) SL tablet 0.4 mg (has no administration in time range)  acetaminophen (TYLENOL) tablet 650 mg (650 mg Oral Given 03/08/22 0141)  ondansetron (ZOFRAN) injection 4 mg (4 mg Intravenous Given 03/08/22 0142)  insulin aspart (novoLOG) injection 0-9 Units (2 Units Subcutaneous Given 03/08/22 0724)  metoprolol succinate (TOPROL-XL) 24 hr tablet 50 mg (50 mg Oral Not Given 03/08/22 1000)  rosuvastatin (CRESTOR) tablet 40 mg (40 mg Oral Not Given 03/08/22 1000)  haloperidol lactate (HALDOL) injection 4 mg (4 mg Intravenous Given 03/08/22 0827)  LORazepam (ATIVAN) tablet 1 mg (1 mg Oral Not Given 03/08/22 1002)  potassium chloride SA (KLOR-CON M) CR tablet 20 mEq (20 mEq Oral Not Given 03/08/22 0941)  heparin ADULT infusion 100 units/mL (25000 units/270m) (has no administration in time range)  OLANZapine (ZYPREXA) injection 10 mg (10 mg Intramuscular Given 03/07/22 1100)  sterile water (preservative free) injection (  Given 03/07/22 1100)  LORazepam (ATIVAN) injection 2 mg (2 mg Intramuscular Given 03/07/22 1221)  heparin bolus via infusion 4,000 Units (4,000 Units Intravenous Bolus from Bag 03/07/22 1458)  aspirin chewable tablet 324 mg (324 mg Oral Given 03/07/22 2103)    Or  aspirin suppository 300 mg ( Rectal See Alternative 03/07/22 2103)  potassium chloride (KLOR-CON M) CR tablet 30 mEq (30 mEq Oral Given 03/07/22 2053)    Mobility  walks with person assist High fall risk   Focused Assessments Neuro Assessment Handoff:  Swallow screen pass? Yes  Cardiac Rhythm: Normal sinus rhythm       Neuro Assessment: Exceptions to WDL Neuro Checks:      Last Documented NIHSS Modified Score:   Has TPA been given?  No If patient is a Neuro Trauma and patient is going to OR before floor call report to [place] nurse: [phone] or [phone]   R Recommendations: See Admitting Provider Note  Report given to:   Additional Notes:   Pt is an IVC, Heparin level was just high, restart at 11 AM.

## 2022-03-08 NOTE — Consult Note (Signed)
Ref: Ngetich, Nelda Bucks, NP   Subjective:  Patient not quite ready for cardiac catheterization and prefers medical therapy. Daughter, [name] made aware of patient's desire to treat medically and risk of not undergoing cardiac catheterization with possible stenting of blockage/blockages with 1 % risk of the procedure. VS stable. Awaiting echocardiogram and CMET.  Objective:  Vital Signs in the last 24 hours: Temp:  [96.8 F (36 C)-98.8 F (37.1 C)] 98.2 F (36.8 C) (09/05 0724) Pulse Rate:  [51-106] 68 (09/05 0900) Cardiac Rhythm: Normal sinus rhythm (09/04 2221) Resp:  [12-25] 16 (09/05 0900) BP: (102-165)/(55-96) 110/94 (09/05 0900) SpO2:  [93 %-100 %] 98 % (09/05 0900) Weight:  [71.3 kg] 71.3 kg (09/04 1406)  Physical Exam: BP Readings from Last 1 Encounters:  03/08/22 (!) 110/94     Wt Readings from Last 1 Encounters:  03/07/22 71.3 kg    Weight change:  Body mass index is 26.16 kg/m. HEENT: [place]/AT, Eyes-Brown, Conjunctiva-Pink, Sclera-Non-icteric Neck: No JVD, No bruit, Trachea midline. Lungs:  Clear, Bilateral. Cardiac:  Regular rhythm, normal S1 and S2, no S3. II/VI systolic murmur. Abdomen:  Soft, non-tender. BS present. Extremities:  Trace edema present. No cyanosis. No clubbing. CNS: AxOx3, Cranial nerves grossly intact, moves all 4 extremities.  Skin: Warm and dry.   Intake/Output from previous day: 09/04 0701 - 09/05 0700 In: 364.2 [P.O.:240; I.V.:124.2] Out: 3 [Urine:3]    Lab Results: BMET    Component Value Date/Time   NA 138 03/07/2022 1047   NA 130 (L) 05/07/2021 1426   NA 127 (L) 05/07/2021 1415   NA 137 03/23/2020 1433   NA 141 03/12/2020 1529   NA 139 01/11/2019 1419   K 3.3 (L) 03/07/2022 1047   K 3.8 05/07/2021 1426   K 3.8 05/07/2021 1415   CL 109 03/07/2022 1047   CL 93 (L) 05/07/2021 1426   CL 92 (L) 05/07/2021 1415   CO2 18 (L) 03/07/2022 1047   CO2 26 05/07/2021 1415   CO2 29 05/01/2021 0545   GLUCOSE 258 (H) 03/07/2022 1047    GLUCOSE 267 (H) 05/07/2021 1426   GLUCOSE 263 (H) 05/07/2021 1415   BUN 15 03/07/2022 1047   BUN 22 05/07/2021 1426   BUN 22 05/07/2021 1415   BUN 25 03/23/2020 1433   BUN 16 03/12/2020 1529   BUN 17 01/11/2019 1419   CREATININE 1.37 (H) 03/07/2022 1047   CREATININE 1.50 (H) 05/07/2021 1426   CREATININE 1.49 (H) 05/07/2021 1415   CREATININE 2.39 (H) 01/22/2021 1531   CREATININE 1.18 (H) 10/01/2020 1147   CREATININE 1.20 (H) 05/26/2020 1553   CALCIUM 9.4 03/07/2022 1047   CALCIUM 8.7 (L) 05/07/2021 1415   CALCIUM 8.9 05/01/2021 0545   GFRNONAA 42 (L) 03/07/2022 1047   GFRNONAA 38 (L) 05/07/2021 1415   GFRNONAA 48 (L) 05/01/2021 0545   GFRNONAA 47 (L) 10/01/2020 1147   GFRNONAA 46 (L) 05/26/2020 1553   GFRNONAA 48 (L) 04/30/2020 1206   GFRAA 54 (L) 10/01/2020 1147   GFRAA 54 (L) 05/26/2020 1553   GFRAA 56 (L) 04/30/2020 1206   CBC    Component Value Date/Time   WBC 7.0 03/08/2022 0915   RBC 3.56 (L) 03/08/2022 0915   HGB 11.4 (L) 03/08/2022 0915   HCT 35.5 (L) 03/08/2022 0915   PLT 222 03/08/2022 0915   MCV 99.7 03/08/2022 0915   MCH 32.0 03/08/2022 0915   MCHC 32.1 03/08/2022 0915   RDW 15.3 03/08/2022 0915   LYMPHSABS 0.7 03/07/2022 1047  MONOABS 0.2 03/07/2022 1047   EOSABS 0.0 03/07/2022 1047   BASOSABS 0.0 03/07/2022 1047   HEPATIC Function Panel Recent Labs    04/17/21 1938 05/07/21 1415 03/07/22 1047  PROT 7.6 6.4* 7.3  ALBUMIN 3.4* 2.8* 3.4*  AST '19 16 18  '$ ALT '16 12 15  '$ ALKPHOS 94 92 72   HEMOGLOBIN A1C Lab Results  Component Value Date   MPG 231.69 03/07/2022   CARDIAC ENZYMES Lab Results  Component Value Date   CKTOTAL 251 (H) 03/07/2022   TROPONINI <0.03 12/26/2016   TROPONINI <0.03 12/25/2016   BNP No results for input(s): "PROBNP" in the last 8760 hours. TSH Recent Labs    04/19/21 1933 03/07/22 1630  TSH 1.615 0.471   CHOLESTEROL Recent Labs    04/19/21 1933 03/08/22 0014  CHOL 166 215*    Scheduled Meds:  aspirin  EC  81 mg Oral Daily   insulin aspart  0-9 Units Subcutaneous TID WC   LORazepam  1 mg Oral BID   metoprolol succinate  50 mg Oral Daily   potassium chloride  20 mEq Oral Once   rosuvastatin  40 mg Oral Daily   Continuous Infusions:  heparin 1,000 Units/hr (03/08/22 0753)   PRN Meds:.acetaminophen, haloperidol lactate, nitroGLYCERIN, ondansetron (ZOFRAN) IV  Assessment/Plan:  NSTEMI Type 2 DM HTN HLD Hypothyroidism Schizophrenia GERD COPD  Plan: Check echocardiogram when done. Continue B-blocker and Crestor. Add Lorazepam and offer cardiac cath today v/s in future when patient is ready.   LOS: 0 days   Time spent including chart review, lab review, examination, discussion with patient :  min   Dixie Dials  MD  03/08/2022, 9:23 AM

## 2022-03-08 NOTE — ED Notes (Signed)
Pt ambulated to bathroom with a standby assist.

## 2022-03-08 NOTE — Progress Notes (Signed)
  Echocardiogram 2D Echocardiogram has been performed.  [name] Long 03/08/2022, 11:02 AM

## 2022-03-08 NOTE — Progress Notes (Signed)
Patient unhooked IV while in the room with the safety sitter. Patient started getting hostile and verbally aggressive with the nurse tech while being assisted to the bathroom. When the daughter [name] called and asked about visitation the patient started to get agitated.

## 2022-03-08 NOTE — ED Notes (Signed)
Pt placed on hospital bed, walked to bathroom with standby assist, no other complaints at this time.

## 2022-03-08 NOTE — ED Notes (Signed)
Cardiologist speaking with daughter now in regards to cardiac catheterization.

## 2022-03-08 NOTE — ED Notes (Signed)
Echo at bedside

## 2022-03-08 NOTE — ED Notes (Signed)
Pt making a phone call to her daughter at this time.

## 2022-03-09 DIAGNOSIS — E039 Hypothyroidism, unspecified: Secondary | ICD-10-CM | POA: Diagnosis not present

## 2022-03-09 DIAGNOSIS — I10 Essential (primary) hypertension: Secondary | ICD-10-CM | POA: Diagnosis not present

## 2022-03-09 DIAGNOSIS — I5032 Chronic diastolic (congestive) heart failure: Secondary | ICD-10-CM | POA: Diagnosis not present

## 2022-03-09 DIAGNOSIS — I214 Non-ST elevation (NSTEMI) myocardial infarction: Secondary | ICD-10-CM | POA: Diagnosis not present

## 2022-03-09 LAB — GLUCOSE, CAPILLARY
Glucose-Capillary: 223 mg/dL — ABNORMAL HIGH (ref 70–99)
Glucose-Capillary: 168 mg/dL — ABNORMAL HIGH (ref 70–99)
Glucose-Capillary: 167 mg/dL — ABNORMAL HIGH (ref 70–99)

## 2022-03-09 LAB — CBC
HCT: 32.1 % — ABNORMAL LOW (ref 36.0–46.0)
Hemoglobin: 10.6 g/dL — ABNORMAL LOW (ref 12.0–15.0)
MCH: 32.4 pg (ref 26.0–34.0)
MCHC: 33 g/dL (ref 30.0–36.0)
MCV: 98.2 fL (ref 80.0–100.0)
Platelets: 194 10*3/uL (ref 150–400)
RBC: 3.27 MIL/uL — ABNORMAL LOW (ref 3.87–5.11)
RDW: 15.4 % (ref 11.5–15.5)
WBC: 5.4 10*3/uL (ref 4.0–10.5)
nRBC: 0 % (ref 0.0–0.2)

## 2022-03-09 LAB — BASIC METABOLIC PANEL
Anion gap: 5 (ref 5–15)
BUN: 18 mg/dL (ref 8–23)
CO2: 22 mmol/L (ref 22–32)
Calcium: 8.8 mg/dL — ABNORMAL LOW (ref 8.9–10.3)
Chloride: 113 mmol/L — ABNORMAL HIGH (ref 98–111)
Creatinine, Ser: 1.39 mg/dL — ABNORMAL HIGH (ref 0.44–1.00)
GFR, Estimated: 41 mL/min — ABNORMAL LOW (ref >60)
Glucose, Bld: 126 mg/dL — ABNORMAL HIGH (ref 70–99)
Potassium: 3.7 mmol/L (ref 3.5–5.1)
Sodium: 140 mmol/L (ref 135–145)

## 2022-03-09 LAB — HEPARIN LEVEL (UNFRACTIONATED): Heparin Unfractionated: 0.84 IU/mL — ABNORMAL HIGH (ref 0.30–0.70)

## 2022-03-09 LAB — LIPOPROTEIN A (LPA): Lipoprotein (a): 296.7 nmol/L — ABNORMAL HIGH (ref <75.0)

## 2022-03-09 MED ORDER — LORAZEPAM 0.5 MG PO TABS
0.5000 mg | ORAL_TABLET | Freq: Two times a day (BID) | ORAL | Status: DC
  Administered 2022-03-09 – 2022-03-10 (×3): 0.5 mg via ORAL
  Filled 2022-03-09 (×3): qty 1

## 2022-03-09 MED ORDER — ALBUTEROL SULFATE (2.5 MG/3ML) 0.083% IN NEBU: 2.5000 mg | INHALATION_SOLUTION | Freq: Four times a day (QID) | RESPIRATORY_TRACT | Status: DC | PRN

## 2022-03-09 MED ORDER — PALIPERIDONE ER 6 MG PO TB24
6.0000 mg | ORAL_TABLET | Freq: Every day | ORAL | Status: DC
  Administered 2022-03-09 – 2022-03-15 (×5): 6 mg via ORAL
  Filled 2022-03-09 (×8): qty 1

## 2022-03-09 MED ORDER — DIVALPROEX SODIUM 250 MG PO DR TAB
250.0000 mg | DELAYED_RELEASE_TABLET | Freq: Two times a day (BID) | ORAL | Status: DC
  Administered 2022-03-09: 250 mg via ORAL
  Filled 2022-03-09 (×4): qty 1

## 2022-03-09 MED ORDER — ACETAMINOPHEN 325 MG PO TABS
650.0000 mg | ORAL_TABLET | Freq: Four times a day (QID) | ORAL | Status: DC | PRN
  Administered 2022-03-10 – 2022-03-21 (×28): 650 mg via ORAL
  Filled 2022-03-09 (×30): qty 2

## 2022-03-09 MED ORDER — HEPARIN SODIUM (PORCINE) 5000 UNIT/ML IJ SOLN
5000.0000 [IU] | Freq: Three times a day (TID) | INTRAMUSCULAR | Status: DC
  Administered 2022-03-09 – 2022-03-11 (×8): 5000 [IU] via SUBCUTANEOUS
  Filled 2022-03-09 (×9): qty 1

## 2022-03-09 MED ORDER — CLOPIDOGREL BISULFATE 75 MG PO TABS
75.0000 mg | ORAL_TABLET | Freq: Every day | ORAL | Status: DC
  Administered 2022-03-09 – 2022-03-12 (×4): 75 mg via ORAL
  Filled 2022-03-09 (×4): qty 1

## 2022-03-09 MED ORDER — ALBUTEROL SULFATE (2.5 MG/3ML) 0.083% IN NEBU
INHALATION_SOLUTION | RESPIRATORY_TRACT | Status: AC
  Administered 2022-03-09: 2.5 mg via RESPIRATORY_TRACT
  Filled 2022-03-09: qty 3

## 2022-03-09 NOTE — Progress Notes (Signed)
Inpatient Behavioral Health Placement  Pt meets inpatient criteria per Rosezetta Schlatter, MD. Referral was sent to the following facilities;    Destination Service Provider Address Phone Fax  [address] [place] [address] [phone]  [facility]  [address] [place] [address] [phone] [facility]  [address] [place] [address] [phone]  [facility] [address]  [address] [place] [address] [place]  [facility]  [place], [place] [address] [phone] [address] [place] [address] [phone]  [name]  [place], [place] [address] [phone]  [facility]  [place], [place] [address] [phone]  [place] [place] [address] [place]  [facility]  [address] [place] [address] [phone]  [facility]  [address] [place] [address] [phone]  [place] [address] [place] [address] [phone]  [facility]  [address], [place] [address] [phone]  [facility]  [address], [place] [address] [phone]  [facility]  [address] [place] [address] [phone]  [facility]  [address], [place] [address] [phone]  [facility]  [address] [place] [address] [phone] [facility]  [place], [place] [address] [phone] [facility]  [address] [place] [address] [phone]   Situation ongoing,  CSW will follow up.   Benjaman Kindler, MSW, LCSWA 03/09/2022  @ 3:28 PM

## 2022-03-09 NOTE — Social Work (Signed)
Pt IVC was done on September 4th and is good for 7days. IVC will need to be renewed by Monday September 11th in order for IVC to be continued.
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
## 2022-03-09 NOTE — Progress Notes (Signed)
Pt is confused, agitated, and refusing to take any medication. Also becoming verbally aggressive with staff. Pt is speaking to imaginary people that are not physically present. Paged MD and was ordered to give Ativan 0.39m IV to help with sxs. Ativan given at 2043.
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
## 2022-03-09 NOTE — Consult Note (Signed)
Ref: Ngetich, Nelda Bucks, NP   Subjective:  Awakens easily. VS stable. Monitor. Sinus rhythm  Objective:  Vital Signs in the last 24 hours: Temp:  [97.7 F (36.5 C)-98.1 F (36.7 C)] 98 F (36.7 C) (09/06 0729) Pulse Rate:  [53-75] 75 (09/06 0729) Cardiac Rhythm: Heart block (09/06 0749) Resp:  [14-23] 18 (09/06 0729) BP: (114-145)/(68-104) 138/72 (09/06 0729) SpO2:  [95 %-100 %] 95 % (09/06 0729)  Physical Exam: BP Readings from Last 1 Encounters:  03/09/22 138/72     Wt Readings from Last 1 Encounters:  03/07/22 71.3 kg    Weight change:  Body mass index is 26.16 kg/m. HEENT: [place]/AT, Eyes-Brown, Conjunctiva-Pink, Sclera-Non-icteric Neck: No JVD, No bruit, Trachea midline. Lungs:  Clear, Bilateral. Cardiac:  Regular rhythm, normal S1 and S2, no S3. II/VI systolic murmur. Abdomen:  Soft, non-tender. BS present. Extremities:  No edema present. No cyanosis. No clubbing. CNS: AxOx2, Cranial nerves grossly intact, moves all 4 extremities.  Skin: Warm and dry.   Intake/Output from previous day: 09/05 0701 - 09/06 0700 In: 610.9 [P.O.:480; I.V.:130.9] Out: -     Lab Results: BMET    Component Value Date/Time   NA 140 03/09/2022 0559   NA 139 03/08/2022 0915   NA 138 03/07/2022 1047   NA 137 03/23/2020 1433   NA 141 03/12/2020 1529   NA 139 01/11/2019 1419   K 3.7 03/09/2022 0559   K 3.6 03/08/2022 0915   K 3.3 (L) 03/07/2022 1047   CL 113 (H) 03/09/2022 0559   CL 112 (H) 03/08/2022 0915   CL 109 03/07/2022 1047   CO2 22 03/09/2022 0559   CO2 19 (L) 03/08/2022 0915   CO2 18 (L) 03/07/2022 1047   GLUCOSE 126 (H) 03/09/2022 0559   GLUCOSE 164 (H) 03/08/2022 0915   GLUCOSE 258 (H) 03/07/2022 1047   BUN 18 03/09/2022 0559   BUN 19 03/08/2022 0915   BUN 15 03/07/2022 1047   BUN 25 03/23/2020 1433   BUN 16 03/12/2020 1529   BUN 17 01/11/2019 1419   CREATININE 1.39 (H) 03/09/2022 0559   CREATININE 1.45 (H) 03/08/2022 0915   CREATININE 1.37 (H) 03/07/2022 1047    CREATININE 2.39 (H) 01/22/2021 1531   CREATININE 1.18 (H) 10/01/2020 1147   CREATININE 1.20 (H) 05/26/2020 1553   CALCIUM 8.8 (L) 03/09/2022 0559   CALCIUM 8.9 03/08/2022 0915   CALCIUM 9.4 03/07/2022 1047   GFRNONAA 41 (L) 03/09/2022 0559   GFRNONAA 39 (L) 03/08/2022 0915   GFRNONAA 42 (L) 03/07/2022 1047   GFRNONAA 47 (L) 10/01/2020 1147   GFRNONAA 46 (L) 05/26/2020 1553   GFRNONAA 48 (L) 04/30/2020 1206   GFRAA 54 (L) 10/01/2020 1147   GFRAA 54 (L) 05/26/2020 1553   GFRAA 56 (L) 04/30/2020 1206   CBC    Component Value Date/Time   WBC 5.4 03/09/2022 0559   RBC 3.27 (L) 03/09/2022 0559   HGB 10.6 (L) 03/09/2022 0559   HCT 32.1 (L) 03/09/2022 0559   PLT 194 03/09/2022 0559   MCV 98.2 03/09/2022 0559   MCH 32.4 03/09/2022 0559   MCHC 33.0 03/09/2022 0559   RDW 15.4 03/09/2022 0559   LYMPHSABS 0.7 03/07/2022 1047   MONOABS 0.2 03/07/2022 1047   EOSABS 0.0 03/07/2022 1047   BASOSABS 0.0 03/07/2022 1047   HEPATIC Function Panel Recent Labs    05/07/21 1415 03/07/22 1047 03/08/22 0915  PROT 6.4* 7.3 6.7  ALBUMIN 2.8* 3.4* 3.1*  AST 16 18 21  ALT '12 15 14  '$ ALKPHOS 92 72 76   HEMOGLOBIN A1C Lab Results  Component Value Date   MPG 231.69 03/07/2022   CARDIAC ENZYMES Lab Results  Component Value Date   CKTOTAL 251 (H) 03/07/2022   TROPONINI <0.03 12/26/2016   TROPONINI <0.03 12/25/2016   BNP No results for input(s): "PROBNP" in the last 8760 hours. TSH Recent Labs    04/19/21 1933 03/07/22 1630  TSH 1.615 0.471   CHOLESTEROL Recent Labs    04/19/21 1933 03/08/22 0014  CHOL 166 215*    Scheduled Meds:  aspirin EC  81 mg Oral Daily   clopidogrel  75 mg Oral Daily   divalproex  250 mg Oral Q12H   heparin  5,000 Units Subcutaneous Q8H   insulin aspart  0-9 Units Subcutaneous TID WC   LORazepam  0.5 mg Oral BID   metoprolol succinate  50 mg Oral Daily   paliperidone  6 mg Oral QHS   potassium chloride  20 mEq Oral Once   rosuvastatin  40 mg  Oral Daily   Continuous Infusions:  PRN Meds:.acetaminophen, haloperidol lactate, nitroGLYCERIN, ondansetron (ZOFRAN) IV  Assessment/Plan:  NSTEMI HTN HLD Type 2 DM Hypothyroidism Schizophrenia COPD GERD  Plan: Continue medical treatment. Add Clopidogrel. Increase activity. Decrease Lorazepam by 50 %. F/U by me in 1-2 weeks post discharge by psych unit.   LOS: 1 day   Time spent including chart review, lab review, examination, discussion with patient : 30 min   Dixie Dials  MD  03/09/2022, 9:32 AM

## 2022-03-09 NOTE — Plan of Care (Signed)
  Problem: Fluid Volume: Goal: Ability to maintain a balanced intake and output will improve Outcome: Progressing   Problem: Clinical Measurements: Goal: Will remain free from infection Outcome: Progressing Goal: Respiratory complications will improve Outcome: Progressing Goal: Cardiovascular complication will be avoided Outcome: Progressing   Problem: Activity: Goal: Risk for activity intolerance will decrease Outcome: Progressing   Problem: Pain Managment: Goal: General experience of comfort will improve Outcome: Progressing   Problem: Coping: Goal: Level of anxiety will decrease Outcome: Not Progressing

## 2022-03-09 NOTE — Progress Notes (Signed)
Pt is calm and currently sleeping in bed. 1:1 sitter at bedside.

## 2022-03-09 NOTE — Progress Notes (Signed)
Mobility Specialist Progress Note    03/09/22 1123  Mobility  Activity Ambulated with assistance in hallway  Level of Assistance Contact guard assist, steadying assist  Assistive Device Front wheel walker  Distance Ambulated (ft) 420 ft  Activity Response Tolerated well  $Mobility charge 1 Mobility   Pre-Mobility: 80 HR During Mobility: 110 HR Post-Mobility: 97 HR  Pt received coming out of BR and agreeable. No complaints on walk. Returned to chair with call bell in reach.    [name] Mobility Specialist
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
## 2022-03-10 ENCOUNTER — Telehealth: Payer: Self-pay | Admitting: Orthopedic Surgery

## 2022-03-10 ENCOUNTER — Other Ambulatory Visit: Payer: Self-pay

## 2022-03-10 DIAGNOSIS — I10 Essential (primary) hypertension: Secondary | ICD-10-CM | POA: Diagnosis not present

## 2022-03-10 DIAGNOSIS — I5032 Chronic diastolic (congestive) heart failure: Secondary | ICD-10-CM | POA: Diagnosis not present

## 2022-03-10 DIAGNOSIS — E039 Hypothyroidism, unspecified: Secondary | ICD-10-CM | POA: Diagnosis not present

## 2022-03-10 DIAGNOSIS — I214 Non-ST elevation (NSTEMI) myocardial infarction: Secondary | ICD-10-CM | POA: Diagnosis not present

## 2022-03-10 LAB — VITAMIN B1: Vitamin B1 (Thiamine): 99.5 nmol/L (ref 66.5–200.0)

## 2022-03-10 LAB — GLUCOSE, CAPILLARY
Glucose-Capillary: 133 mg/dL — ABNORMAL HIGH (ref 70–99)
Glucose-Capillary: 163 mg/dL — ABNORMAL HIGH (ref 70–99)
Glucose-Capillary: 197 mg/dL — ABNORMAL HIGH (ref 70–99)

## 2022-03-10 MED ORDER — DIVALPROEX SODIUM 125 MG PO CSDR
250.0000 mg | DELAYED_RELEASE_CAPSULE | Freq: Two times a day (BID) | ORAL | Status: DC
Start: 2022-03-10 — End: 2022-03-16
  Administered 2022-03-10 – 2022-03-15 (×2): 250 mg via ORAL
  Filled 2022-03-10 (×12): qty 2

## 2022-03-10 MED ORDER — IBUPROFEN 800 MG PO TABS: 800.0000 mg | ORAL_TABLET | Freq: Every day | ORAL | 0 refills | Status: DC | PRN

## 2022-03-10 NOTE — Progress Notes (Signed)
Mobility Specialist Progress Note    03/10/22 1546  Mobility  Activity Ambulated with assistance in hallway  Level of Assistance Contact guard assist, steadying assist  Assistive Device Cane  Distance Ambulated (ft) 420 ft  Activity Response Tolerated well  $Mobility charge 1 Mobility   Pre-Mobility: 80 HR During Mobility: 95 HR Post-Mobility: 88 HR  Pt received in bed and agreeable. Took x2 standing rest breaks to catch her breath. Returned to chair with call bell in reach.    [name] Mobility Specialist

## 2022-03-10 NOTE — Plan of Care (Signed)
  Problem: Cardiac: Goal: Ability to achieve and maintain adequate cardiovascular perfusion will improve Outcome: Progressing   Problem: Fluid Volume: Goal: Ability to maintain a balanced intake and output will improve Outcome: Progressing   Problem: Clinical Measurements: Goal: Respiratory complications will improve Outcome: Progressing Goal: Cardiovascular complication will be avoided Outcome: Progressing   Problem: Coping: Goal: Level of anxiety will decrease Outcome: Not Progressing

## 2022-03-10 NOTE — Progress Notes (Signed)
The patient's medication order was for 1 mg Ativan to be given with her morning medications. By the time I pulled the medication for the patient and took it to the bedside, the order had changed from 1 mg to 0.5 mg of Ativan. I pulled a 0.5 mg Ativan tablet for the patient.  When I was able to have another nurse witness the return of the 1 mg Ativan tablet I was not able to find it on the list of removed medications. I spoke to [facility], my Director, who told me to call the pharmacy for advice. I was told (by a female, I believe it may have been Legrand Como at [phone]) to have a nurse witness the waste of the 1 mg Ativan tablet and write a progress note for the chart.  April Cooper, RN witnessed to waste of the 1 mg. Ativan tablet.

## 2022-03-10 NOTE — Telephone Encounter (Signed)
Rx has been sent to pharmacy ok per Dr. Sharol Given.

## 2022-03-10 NOTE — Telephone Encounter (Signed)
This is a duplicate message signing off on this and will address the first.

## 2022-03-10 NOTE — Telephone Encounter (Signed)
Pt called requesting a refill ibuprofen. Please send to pharmacy. Pt asked for a call back at [phone].
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
## 2022-03-10 NOTE — ED Notes (Addendum)
Notified primary RN about pt belongings still being in the ED and valuables locked up w/ security. Security taking belongings to [employer]. Per [place] charge RN, leave valuables locked up w/ security in ED.

## 2022-03-10 NOTE — Telephone Encounter (Signed)
Patient called asked if she can get Rx refilled Ibuprofen 800 mg and Robaxin.  Patient asked if the Rx could be sent to [place].    Patient said she is in [place] on the 2nd floor. Patient said her oldest daughter and niece had her committed to the hospital. Patient said the doctors there is against her.   Patient said  Dr. Sharol Given not th  The number to contact patient is [phone] or call the front desk.

## 2022-03-10 NOTE — Consult Note (Signed)
Ref: Nget[place], Nelda Bucks, NP   Subjective:  Awake. She has sinus rhythm with 1st degree AV block. Discussing care with other physicians and care workers. She is aware of heart attack with small myocardial injury and has coronary artery lesions. She is not quite ready to undergo cardiac catheterization but has some interest in the procedure now.  Objective:  Vital Signs in the last 24 hours: Temp:  [97.8 F (36.6 C)-98.7 F (37.1 C)] 97.8 F (36.6 C) (09/07 0753) Pulse Rate:  [72] 72 (09/07 0753) Cardiac Rhythm: Heart block (09/07 0840) Resp:  [19-22] 19 (09/07 0753) BP: (115-160)/(67-82) 144/72 (09/07 0753) SpO2:  [97 %-99 %] 98 % (09/07 0753)  Physical Exam: BP Readings from Last 1 Encounters:  03/10/22 (!) 144/72     Wt Readings from Last 1 Encounters:  03/07/22 71.3 kg    Weight change:  Body mass index is 26.16 kg/m. HEENT: [place]/AT, Eyes-Brown, Conjunctiva-Pink, Sclera-Non-icteric Neck: No JVD, No bruit, Trachea midline. Lungs:  Clear, Bilateral. Cardiac:  Regular rhythm, normal S1 and S2, no S3. II/VI systolic murmur. Abdomen:  Soft, non-tender. BS present. Extremities:  No edema present. No cyanosis. No clubbing. CNS: AxOx3, Cranial nerves grossly intact, moves all 4 extremities.  Skin: Warm and dry.   Intake/Output from previous day: 09/06 0701 - 09/07 0700 In: 1214 [P.O.:1060; I.V.:154] Out: 5 [Urine:4; Stool:1]    Lab Results: BMET    Component Value Date/Time   NA 140 03/09/2022 0559   NA 139 03/08/2022 0915   NA 138 03/07/2022 1047   NA 137 03/23/2020 1433   NA 141 03/12/2020 1529   NA 139 01/11/2019 1419   K 3.7 03/09/2022 0559   K 3.6 03/08/2022 0915   K 3.3 (L) 03/07/2022 1047   CL 113 (H) 03/09/2022 0559   CL 112 (H) 03/08/2022 0915   CL 109 03/07/2022 1047   CO2 22 03/09/2022 0559   CO2 19 (L) 03/08/2022 0915   CO2 18 (L) 03/07/2022 1047   GLUCOSE 126 (H) 03/09/2022 0559   GLUCOSE 164 (H) 03/08/2022 0915   GLUCOSE 258 (H) 03/07/2022 1047    BUN 18 03/09/2022 0559   BUN 19 03/08/2022 0915   BUN 15 03/07/2022 1047   BUN 25 03/23/2020 1433   BUN 16 03/12/2020 1529   BUN 17 01/11/2019 1419   CREATININE 1.39 (H) 03/09/2022 0559   CREATININE 1.45 (H) 03/08/2022 0915   CREATININE 1.37 (H) 03/07/2022 1047   CREATININE 2.39 (H) 01/22/2021 1531   CREATININE 1.18 (H) 10/01/2020 1147   CREATININE 1.20 (H) 05/26/2020 1553   CALCIUM 8.8 (L) 03/09/2022 0559   CALCIUM 8.9 03/08/2022 0915   CALCIUM 9.4 03/07/2022 1047   GFRNONAA 41 (L) 03/09/2022 0559   GFRNONAA 39 (L) 03/08/2022 0915   GFRNONAA 42 (L) 03/07/2022 1047   GFRNONAA 47 (L) 10/01/2020 1147   GFRNONAA 46 (L) 05/26/2020 1553   GFRNONAA 48 (L) 04/30/2020 1206   GFRAA 54 (L) 10/01/2020 1147   GFRAA 54 (L) 05/26/2020 1553   GFRAA 56 (L) 04/30/2020 1206   CBC    Component Value Date/Time   WBC 5.4 03/09/2022 0559   RBC 3.27 (L) 03/09/2022 0559   HGB 10.6 (L) 03/09/2022 0559   HCT 32.1 (L) 03/09/2022 0559   PLT 194 03/09/2022 0559   MCV 98.2 03/09/2022 0559   MCH 32.4 03/09/2022 0559   MCHC 33.0 03/09/2022 0559   RDW 15.4 03/09/2022 0559   LYMPHSABS 0.7 03/07/2022 1047   MONOABS 0.2 03/07/2022  1047   EOSABS 0.0 03/07/2022 1047   BASOSABS 0.0 03/07/2022 1047   HEPATIC Function Panel Recent Labs    05/07/21 1415 03/07/22 1047 03/08/22 0915  PROT 6.4* 7.3 6.7  ALBUMIN 2.8* 3.4* 3.1*  AST '16 18 21  '$ ALT '12 15 14  '$ ALKPHOS 92 72 76   HEMOGLOBIN A1C Lab Results  Component Value Date   MPG 231.69 03/07/2022   CARDIAC ENZYMES Lab Results  Component Value Date   CKTOTAL 251 (H) 03/07/2022   TROPONINI <0.03 12/26/2016   TROPONINI <0.03 12/25/2016   BNP No results for input(s): "PROBNP" in the last 8760 hours. TSH Recent Labs    04/19/21 1933 03/07/22 1630  TSH 1.615 0.471   CHOLESTEROL Recent Labs    04/19/21 1933 03/08/22 0014  CHOL 166 215*    Scheduled Meds:  aspirin EC  81 mg Oral Daily   clopidogrel  75 mg Oral Daily   divalproex   250 mg Oral Q12H   heparin  5,000 Units Subcutaneous Q8H   insulin aspart  0-9 Units Subcutaneous TID WC   LORazepam  0.5 mg Oral BID   metoprolol succinate  50 mg Oral Daily   paliperidone  6 mg Oral QHS   potassium chloride  20 mEq Oral Once   rosuvastatin  40 mg Oral Daily   Continuous Infusions:  PRN Meds:.acetaminophen, albuterol, haloperidol lactate, nitroGLYCERIN, ondansetron (ZOFRAN) IV  Assessment/Plan:  NSTEMI HTN HLD Type 2 DM Hypothyroidism COPD GERD Schizophrenia with manic episode  Plan: Continue medical treatment. Continue postponing cardiac catheterization for now.   LOS: 2 days   Time spent including chart review, lab review, examination, discussion with patient/Daughter/Nurse : 30 min   Dixie Dials  MD  03/10/2022, 11:25 AM
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
## 2022-03-10 NOTE — Telephone Encounter (Signed)
Pt called again

## 2022-03-11 DIAGNOSIS — E039 Hypothyroidism, unspecified: Secondary | ICD-10-CM | POA: Diagnosis not present

## 2022-03-11 DIAGNOSIS — I10 Essential (primary) hypertension: Secondary | ICD-10-CM | POA: Diagnosis not present

## 2022-03-11 DIAGNOSIS — I5032 Chronic diastolic (congestive) heart failure: Secondary | ICD-10-CM | POA: Diagnosis not present

## 2022-03-11 DIAGNOSIS — I214 Non-ST elevation (NSTEMI) myocardial infarction: Secondary | ICD-10-CM | POA: Diagnosis not present

## 2022-03-11 LAB — GLUCOSE, CAPILLARY
Glucose-Capillary: 177 mg/dL — ABNORMAL HIGH (ref 70–99)
Glucose-Capillary: 200 mg/dL — ABNORMAL HIGH (ref 70–99)
Glucose-Capillary: 152 mg/dL — ABNORMAL HIGH (ref 70–99)
Glucose-Capillary: 118 mg/dL — ABNORMAL HIGH (ref 70–99)

## 2022-03-11 MED ORDER — ORAL CARE MOUTH RINSE: 15.0000 mL | OROMUCOSAL | Status: DC | PRN

## 2022-03-11 MED ORDER — BENZONATATE 100 MG PO CAPS
200.0000 mg | ORAL_CAPSULE | Freq: Three times a day (TID) | ORAL | Status: DC | PRN
Start: 2022-03-11 — End: 2022-03-21
  Administered 2022-03-11: 200 mg via ORAL
  Filled 2022-03-11: qty 2

## 2022-03-11 MED ORDER — ISOSORBIDE MONONITRATE ER 30 MG PO TB24
15.0000 mg | ORAL_TABLET | Freq: Every day | ORAL | Status: DC
  Administered 2022-03-11 – 2022-03-21 (×9): 15 mg via ORAL
  Filled 2022-03-11 (×11): qty 1

## 2022-03-11 NOTE — Plan of Care (Signed)
  Problem: Education: Goal: Understanding of cardiac disease, CV risk reduction, and recovery process will improve Outcome: Progressing   Problem: Cardiac: Goal: Ability to achieve and maintain adequate cardiovascular perfusion will improve Outcome: Progressing   Problem: Education: Goal: Knowledge of General Education information will improve Description: Including pain rating scale, medication(s)/side effects and non-pharmacologic comfort measures Outcome: Progressing   Problem: Health Behavior/Discharge Planning: Goal: Ability to manage health-related needs will improve Outcome: Progressing   Problem: Clinical Measurements: Goal: Cardiovascular complication will be avoided Outcome: Progressing

## 2022-03-11 NOTE — Consult Note (Signed)
Ref: Ngetich, Nelda Bucks, NP   Subjective:  Awake.  She had sinus rhythm yesterday. Monitor has some irregularities of rhythm. Patient awaiting transfer to psych unit. Her cardiac catheterization is postponed for 1-2 weeks when patient is mentally stable and ready. She has knee pain and is made aware that ibuprofen use is not recommended due to MI.  Objective:  Vital Signs in the last 24 hours: Temp:  [97.6 F (36.4 C)-98.2 F (36.8 C)] 97.6 F (36.4 C) (09/08 0550) Pulse Rate:  [60-77] 68 (09/08 0550) Cardiac Rhythm: Normal sinus rhythm (09/08 0700) Resp:  [15-27] 15 (09/08 0550) BP: (143-158)/(60-91) 158/75 (09/08 0900) SpO2:  [98 %-99 %] 98 % (09/08 0550)  Physical Exam: BP Readings from Last 1 Encounters:  03/11/22 (!) 158/75     Wt Readings from Last 1 Encounters:  03/07/22 71.3 kg    Weight change:  Body mass index is 26.16 kg/m. HEENT: [place]/AT, Eyes-Brown, Conjunctiva-Pink, Sclera-Non-icteric Neck: No JVD, No bruit, Trachea midline. Lungs:  Clear, Bilateral. Cardiac:  Regular rhythm, normal S1 and S2, no S3. II/VI systolic murmur. Abdomen:  Soft, non-tender. BS present. Extremities:  No edema present. No cyanosis. No clubbing. CNS: AxOx3, Cranial nerves grossly intact, moves all 4 extremities.  Skin: Warm and dry.   Intake/Output from previous day: 09/07 0701 - 09/08 0700 In: 1240 [P.O.:1240] Out: -     Lab Results: BMET    Component Value Date/Time   NA 140 03/09/2022 0559   NA 139 03/08/2022 0915   NA 138 03/07/2022 1047   NA 137 03/23/2020 1433   NA 141 03/12/2020 1529   NA 139 01/11/2019 1419   K 3.7 03/09/2022 0559   K 3.6 03/08/2022 0915   K 3.3 (L) 03/07/2022 1047   CL 113 (H) 03/09/2022 0559   CL 112 (H) 03/08/2022 0915   CL 109 03/07/2022 1047   CO2 22 03/09/2022 0559   CO2 19 (L) 03/08/2022 0915   CO2 18 (L) 03/07/2022 1047   GLUCOSE 126 (H) 03/09/2022 0559   GLUCOSE 164 (H) 03/08/2022 0915   GLUCOSE 258 (H) 03/07/2022 1047   BUN 18  03/09/2022 0559   BUN 19 03/08/2022 0915   BUN 15 03/07/2022 1047   BUN 25 03/23/2020 1433   BUN 16 03/12/2020 1529   BUN 17 01/11/2019 1419   CREATININE 1.39 (H) 03/09/2022 0559   CREATININE 1.45 (H) 03/08/2022 0915   CREATININE 1.37 (H) 03/07/2022 1047   CREATININE 2.39 (H) 01/22/2021 1531   CREATININE 1.18 (H) 10/01/2020 1147   CREATININE 1.20 (H) 05/26/2020 1553   CALCIUM 8.8 (L) 03/09/2022 0559   CALCIUM 8.9 03/08/2022 0915   CALCIUM 9.4 03/07/2022 1047   GFRNONAA 41 (L) 03/09/2022 0559   GFRNONAA 39 (L) 03/08/2022 0915   GFRNONAA 42 (L) 03/07/2022 1047   GFRNONAA 47 (L) 10/01/2020 1147   GFRNONAA 46 (L) 05/26/2020 1553   GFRNONAA 48 (L) 04/30/2020 1206   GFRAA 54 (L) 10/01/2020 1147   GFRAA 54 (L) 05/26/2020 1553   GFRAA 56 (L) 04/30/2020 1206   CBC    Component Value Date/Time   WBC 5.4 03/09/2022 0559   RBC 3.27 (L) 03/09/2022 0559   HGB 10.6 (L) 03/09/2022 0559   HCT 32.1 (L) 03/09/2022 0559   PLT 194 03/09/2022 0559   MCV 98.2 03/09/2022 0559   MCH 32.4 03/09/2022 0559   MCHC 33.0 03/09/2022 0559   RDW 15.4 03/09/2022 0559   LYMPHSABS 0.7 03/07/2022 1047   MONOABS 0.2 03/07/2022  1047   EOSABS 0.0 03/07/2022 1047   BASOSABS 0.0 03/07/2022 1047   HEPATIC Function Panel Recent Labs    05/07/21 1415 03/07/22 1047 03/08/22 0915  PROT 6.4* 7.3 6.7  ALBUMIN 2.8* 3.4* 3.1*  AST '16 18 21  '$ ALT '12 15 14  '$ ALKPHOS 92 72 76   HEMOGLOBIN A1C Lab Results  Component Value Date   MPG 231.69 03/07/2022   CARDIAC ENZYMES Lab Results  Component Value Date   CKTOTAL 251 (H) 03/07/2022   TROPONINI <0.03 12/26/2016   TROPONINI <0.03 12/25/2016   BNP No results for input(s): "PROBNP" in the last 8760 hours. TSH Recent Labs    04/19/21 1933 03/07/22 1630  TSH 1.615 0.471   CHOLESTEROL Recent Labs    04/19/21 1933 03/08/22 0014  CHOL 166 215*    Scheduled Meds:  aspirin EC  81 mg Oral Daily   clopidogrel  75 mg Oral Daily   divalproex  250 mg Oral  Q12H   heparin  5,000 Units Subcutaneous Q8H   insulin aspart  0-9 Units Subcutaneous TID WC   metoprolol succinate  50 mg Oral Daily   paliperidone  6 mg Oral QHS   potassium chloride  20 mEq Oral Once   rosuvastatin  40 mg Oral Daily   Continuous Infusions:  PRN Meds:.acetaminophen, albuterol, haloperidol lactate, nitroGLYCERIN, ondansetron (ZOFRAN) IV  Assessment/Plan:  NSTEMI HTN HLD Type 2 Dm Hypothyroidism Bipolar disorder, Schizoaffective COPD GERD Right knee pain CKD, II  Plan: May use Tylenol or diclofenac gel 1 % over affected joint. May transfer to psych unit. Cardiac cath on OP basis post discharged form Psych unit. Continue aspirin and Plavix with metoprolol and rosuvastatin. Add low dose isosorbide mononitrate.   LOS: 3 days   Time spent including chart review, lab review, examination, discussion with patient/Nurse/Family : 30 min   Dixie Dials  MD  03/11/2022, 10:33 AM
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
## 2022-03-11 NOTE — Progress Notes (Signed)
Mobility Specialist Progress Note    03/11/22 1253  Mobility  Activity Ambulated with assistance in hallway  Level of Assistance Contact guard assist, steadying assist  Assistive Device Front wheel walker  Distance Ambulated (ft) 420 ft  Activity Response Tolerated well  $Mobility charge 1 Mobility   During Mobility: 92 HR Post-Mobility: 75 HR  Pt received in chair and agreeable. No complaints on walk. Returned to chair with call bell in reach.    [name] Mobility Specialist

## 2022-03-11 NOTE — TOC Progression Note (Addendum)
Transition of Care [facility]) - Progression Note    Patient Details  Name: [name] Long MRN: [id number] Date of Birth: 03-May-1951  Transition [facility]) CM/SW Contact  Reece Agar, [place] Phone Number: 03/11/2022, 12:24 PM  Clinical Narrative:    CSW contacted [facility] to inquire about pt referral. No answer or VM option, CSW will follow up at a later time.   CSW contacted [facility] to follow up on referral, no answer, CSW left a VM.  CSW attempted to contact [facility], [place] spoke with admissions. They stated they did not receive the referral, CSW will refax pt. They do have beds available if pt meets the criteria.    CSW contacted [facility] to follow up on referral, CSW was told they did not receive the referral.CSW refax and will follow up again.    Expected Discharge Plan: Psychiatric Hospital Barriers to Discharge: Psych Bed not available  Expected Discharge Plan and Services Expected Discharge Plan: [facility] In-house Referral: Clinical Social Work     Living arrangements for the past 2 months: [place]                                       Social Determinants of Health (SDOH) Interventions    Readmission Risk Interventions     No data to display

## 2022-03-12 DIAGNOSIS — I214 Non-ST elevation (NSTEMI) myocardial infarction: Secondary | ICD-10-CM | POA: Diagnosis not present

## 2022-03-12 LAB — CBC
HCT: 32.6 % — ABNORMAL LOW (ref 36.0–46.0)
Hemoglobin: 10.8 g/dL — ABNORMAL LOW (ref 12.0–15.0)
MCH: 32 pg (ref 26.0–34.0)
MCHC: 33.1 g/dL (ref 30.0–36.0)
MCV: 96.7 fL (ref 80.0–100.0)
Platelets: 184 10*3/uL (ref 150–400)
RBC: 3.37 MIL/uL — ABNORMAL LOW (ref 3.87–5.11)
RDW: 14.9 % (ref 11.5–15.5)
WBC: 5.8 10*3/uL (ref 4.0–10.5)
nRBC: 0 % (ref 0.0–0.2)

## 2022-03-12 LAB — BASIC METABOLIC PANEL
Anion gap: 4 — ABNORMAL LOW (ref 5–15)
BUN: 14 mg/dL (ref 8–23)
CO2: 24 mmol/L (ref 22–32)
Calcium: 9 mg/dL (ref 8.9–10.3)
Chloride: 109 mmol/L (ref 98–111)
Creatinine, Ser: 1.24 mg/dL — ABNORMAL HIGH (ref 0.44–1.00)
GFR, Estimated: 47 mL/min — ABNORMAL LOW (ref >60)
Glucose, Bld: 210 mg/dL — ABNORMAL HIGH (ref 70–99)
Potassium: 4.3 mmol/L (ref 3.5–5.1)
Sodium: 137 mmol/L (ref 135–145)

## 2022-03-12 LAB — MAGNESIUM: Magnesium: 2 mg/dL (ref 1.7–2.4)

## 2022-03-12 LAB — GLUCOSE, CAPILLARY
Glucose-Capillary: 171 mg/dL — ABNORMAL HIGH (ref 70–99)
Glucose-Capillary: 198 mg/dL — ABNORMAL HIGH (ref 70–99)
Glucose-Capillary: 177 mg/dL — ABNORMAL HIGH (ref 70–99)

## 2022-03-12 MED ORDER — APIXABAN 5 MG PO TABS
5.0000 mg | ORAL_TABLET | Freq: Two times a day (BID) | ORAL | Status: DC
  Administered 2022-03-12 – 2022-03-21 (×19): 5 mg via ORAL
  Filled 2022-03-12 (×20): qty 1

## 2022-03-12 MED ORDER — OXYCODONE HCL 5 MG PO TABS
5.0000 mg | ORAL_TABLET | Freq: Once | ORAL | Status: DC
  Filled 2022-03-12: qty 1

## 2022-03-12 NOTE — Progress Notes (Signed)
Patient asking for Ativan and says this should be on her MAR here. Writer noticed it was discontinued yesterday. MD paged.

## 2022-03-12 NOTE — Progress Notes (Signed)
Mobility Specialist Progress Note    03/12/22 1150  Mobility  Activity Ambulated with assistance in hallway  Level of Assistance Standby assist, set-up cues, supervision of patient - no hands on  Assistive Device Front wheel walker  Distance Ambulated (ft) 420 ft  Activity Response Tolerated well  $Mobility charge 1 Mobility   Pre-Mobility: 76 HR During Mobility: 97 HR Post-Mobility: 92 HR  Pt received in chair and agreeable. Had BM in BR. No complaints on walk. Returned to chair with call bell in reach.    [name] Mobility Specialist

## 2022-03-12 NOTE — Progress Notes (Signed)
Patient refusing heparin shot. Patient educated about being at increased risk for blood clots while not receiving shot, and she still refuses, stating, she can't take anymore shots. She said she wishes to ask her MD for a pill form rather than a shot. MD notified.
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
## 2022-03-12 NOTE — Progress Notes (Signed)
Orthopedic Tech Progress Note Patient Details:  [name] Long 04/29/1951 [id number]  Ortho Devices Type of Ortho Device: Arm sling Ortho Device/Splint Location: Right arm Ortho Device/Splint Interventions: Application   Post Interventions Patient Tolerated: Well  [name] Long 03/12/2022, 4:13 PM

## 2022-03-12 NOTE — Progress Notes (Signed)
Patient has decided to refuse every medicine, including insulin coverage because this is not what she normally takes at home--I've tried to encourage her to continue with, at least, insulin coverage this pm meal to keep her sugar regulated.  She states she knows how to control her sugar and just drinks water to help with that.  Dr Candiss Norse has been advised

## 2022-03-12 NOTE — Progress Notes (Signed)
Subjective:  Patient denies any chest pain or shortness of breath   Walking in the room without any problems.  Had episode of paroxysmal atrial fibrillation yesterday converted back to sinus rhythm and remains in sinus rhythm reviewing old records patient had history of A-fib with RVR in the past also and was on Eliquis in the past.  Objective:  Vital Signs in the last 24 hours: Temp:  [97.9 F (36.6 C)] 97.9 F (36.6 C) (09/09 0737) Pulse Rate:  [67-87] 69 (09/09 0737) Resp:  [17-20] 20 (09/09 0737) BP: (110-145)/(64-84) 145/76 (09/09 0737) SpO2:  [96 %-99 %] 96 % (09/09 0737)  Intake/Output from previous day: 09/08 0701 - 09/09 0700 In: 720 [P.O.:720] Out: -  Intake/Output from this shift: Total I/O In: 240 [P.O.:240] Out: -   Physical Exam: Neck: no adenopathy, no carotid bruit, no JVD, and supple, symmetrical, trachea midline Lungs: clear to auscultation bilaterally Heart: regular rate and rhythm, S1, S2 normal, and 2/6 systolic murmur noted Abdomen: soft, non-tender; bowel sounds normal; no masses,  no organomegaly Extremities: extremities normal, atraumatic, no cyanosis or edema  Lab Results: Recent Labs    03/12/22 0705  WBC 5.8  HGB 10.8*  PLT 184   Recent Labs    03/12/22 0705  NA 137  K 4.3  CL 109  CO2 24  GLUCOSE 210*  BUN 14  CREATININE 1.24*   No results for input(s): "TROPONINI" in the last 72 hours.  Invalid input(s): "CK", "MB" Hepatic Function Panel No results for input(s): "PROT", "ALBUMIN", "AST", "ALT", "ALKPHOS", "BILITOT", "BILIDIR", "IBILI" in the last 72 hours. No results for input(s): "CHOL" in the last 72 hours. No results for input(s): "PROTIME" in the last 72 hours.  Imaging: Imaging results have been reviewed and No results found.  Cardiac Studies:  Assessment/Plan:  Status post small non-Q wave myocardial infarction Compensated congestive heart failure secondary to preserved LV systolic function Paroxysmal atrial  fibrillation CHA2DS2-VASc score of 5 Hypertension Type 2 diabetes mellitus Hyperlipidemia Chronic kidney disease. History of hypothyroidism COPD History of schizoaffective disorder/bipolar disorder Plan DC clopidogrel Start Eliquis 5 mg twice daily Continue beta-blockers May need to add low-dose amiodarone if further episodes of atrial fibrillation. DC subcu heparin    LOS: 4 days    [name] Long 03/12/2022, 11:39 AM

## 2022-03-12 NOTE — Progress Notes (Signed)
ANTICOAGULATION CONSULT NOTE - Initial Consult  Pharmacy Consult for Eliquis Indication: atrial fibrillation  Allergies  Allergen Reactions   Citrus Anaphylaxis and Itching   Fish Allergy Anaphylaxis    Cod   Shellfish Allergy Shortness Of Breath and Other (See Comments)    "Affects thyroid" also   Adhesive [Tape] Other (See Comments)    Must have paper tape only   Atorvastatin Other (See Comments)    Body aches   Ibuprofen Swelling    Face swells   Latex Dermatitis   Lipitor [Atorvastatin Calcium] Other (See Comments)    Body aches   Lisinopril Other (See Comments)    PER DR. Melvyn Novas (not recalled by patient)   Other Nausea Only and Other (See Comments)    Collards (gas, too)   Codeine Rash   Tramadol Palpitations    Patient Measurements: Height: _0  (165.1 cm) Weight: 71.3 kg (157 lb 3 oz) IBW/kg (Calculated) : 57  Vital Signs: Temp: 97.8 F (36.6 C) (09/09 1230) Temp Source: Oral (09/09 1230) BP: 139/75 (09/09 1230) Pulse Rate: 77 (09/09 1230)  Labs: Recent Labs    03/12/22 0705  HGB 10.8*  HCT 32.6*  PLT 184  CREATININE 1.24*    Estimated Creatinine Clearance: 41.8 mL/min (A) (by C-G formula based on SCr of 1.24 mg/dL (H)).   Medical History: Past Medical History:  Diagnosis Date   Abscessed tooth 02/09/2020   Acute on chronic congestive heart failure ([place])    Adenomatous colon polyp    Arthritis    "real bad; all over" (07/12/2017)   Asthma    Atrial fibrillation ([place])    BIPOLAR DISORDER 08/31/2006   Qualifier: Diagnosis of  By: Dorathy Daft MD, Marjory Lies     CHRONIC KIDNEY DISEASE STAGE II (MILD) 09/14/2009   Annotation: eGFR 62 Qualifier: Diagnosis of  By: Jess Barters MD, [name]     Chronic lower back pain    Congestive heart failure (CHF) (HCC)    COPD (chronic obstructive pulmonary disease) ([place]) 09/23/2010   Diagnosed at [facility] [place] in 2008 (Dr. Annamaria Boots)    DM (diabetes mellitus) type II controlled with renal manifestation ([place]) 08/31/2006   Qualifier:  Diagnosis of  By: Dorathy Daft MD, Marjory Lies     Dyspnea    "all my life; since 6th grade" (07/12/2017)   Fatty liver    HYPERCHOLESTEROLEMIA 08/31/2006   Intolerance to Lipitor OK on Crestor but medicaid no longer covering     HYPERTENSION, BENIGN SYSTEMIC 08/31/2006   Qualifier: Diagnosis of  By: Dorathy Daft MD, [place]     Hypokalemia    HYPOTHYROIDISM, UNSPECIFIED 08/31/2006   Qualifier: Diagnosis of  By: Dorathy Daft MD, Marjory Lies     Leg swelling 03/14/2018   Pneumonia    "3 times" (07/12/2017)   Pulmonary nodule    Renal cyst    Schizophrenia ([place])    Scoliosis    Stomach problems    Thyroid disorder      Assessment: 71 yo female with hx afib; pharmacy asked to start anticoagulation with po apixaban.  Goal of Therapy:  Monitor platelets by anticoagulation protocol: Yes   Plan:  Start apixaban 5 mg BID.  [place] Crane, Roylene Reason, BCCP Clinical Pharmacist  03/12/2022 1:33 PM   [facility] pharmacy phone numbers are listed on [email or web address]

## 2022-03-13 DIAGNOSIS — I214 Non-ST elevation (NSTEMI) myocardial infarction: Secondary | ICD-10-CM | POA: Diagnosis not present

## 2022-03-13 DIAGNOSIS — F250 Schizoaffective disorder, bipolar type: Secondary | ICD-10-CM

## 2022-03-13 LAB — GLUCOSE, CAPILLARY
Glucose-Capillary: 228 mg/dL — ABNORMAL HIGH (ref 70–99)
Glucose-Capillary: 225 mg/dL — ABNORMAL HIGH (ref 70–99)

## 2022-03-13 MED ORDER — HYDRALAZINE HCL 20 MG/ML IJ SOLN
10.0000 mg | Freq: Four times a day (QID) | INTRAMUSCULAR | Status: DC | PRN
  Administered 2022-03-13: 10 mg via INTRAVENOUS
  Filled 2022-03-13: qty 1

## 2022-03-13 MED ORDER — KETOROLAC TROMETHAMINE 15 MG/ML IJ SOLN
15.0000 mg | Freq: Once | INTRAMUSCULAR | Status: AC
  Administered 2022-03-13: 15 mg via INTRAVENOUS
  Filled 2022-03-13: qty 1

## 2022-03-13 MED ORDER — BISMUTH SUBSALICYLATE 262 MG/15ML PO SUSP
30.0000 mL | Freq: Three times a day (TID) | ORAL | Status: DC | PRN
  Administered 2022-03-13: 30 mL via ORAL
  Filled 2022-03-13: qty 236
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
## 2022-03-13 NOTE — Progress Notes (Signed)
Per Rosezetta Schlatter, MD, patient meets criteria for inpatient treatment. There are no available beds at [facility] today. CSW faxed referrals to the following facilities for review:  [facility] [address] [place] [address] [phone] --  [place] [address], [place] [address] [phone] --  [address] [place] [address] [phone] --  [facility] [place]  Pending - Request Sent N/A [address], [place] [address] [place] --  [facility]  Pending - Request Sent N/A [place], [place] [address] [phone] --  [facility]  Pending - Request Sent N/A [address] [place] [address] [phone] --  [facility]  Pending - Request Sent N/A [place], [name] [address] [phone] --  [facility]  Pending - Request Sent N/A [address] [place] [address] [phone] --  [facility]  Pending - Request Sent N/A [address] [place] [address] [phone] --  [facility]  Pending - Request Sent N/A [address], [place] [address] [phone] --  [facility]  Pending - Request Sent N/A [address] [place] [address] [phone] --  [facility]  Pending - Request Sent N/A [place] [address], [place] [address] [phone] --  [facility]  Pending - Request Sent N/A [address] [place] [address] [phone] --  [place] N/A [address] [place]  [address] [phone] --  [facility]  Pending - Request Sent N/A [address] [place] [address] [phone] --  [facility]  Pending - Request Sent N/A [address], [place] [address] [phone] --  [facility]  Pending - Request Sent N/A [address] [place] [address] [phone] --  [facility]  Pending - Request Sent N/A [place], [place] [address] [phone] --  [facility]  Pending - Request Sent N/A [address], [place] [address] [phone] --  [facility]  Pending - No Request Sent N/A [place], [place]  [address] [phone] --  [facility]  Pending - No Request Sent N/A [address], [place]  [address] [phone] --   TTS will continue to seek bed placement.  Glennie [place], MSW, [place] [place] Phone: [phone] Disposition/TOC

## 2022-03-13 NOTE — Progress Notes (Signed)
About 2315 patient put her call light on to ask for Tylenol. She had just been given Tylenol two hours previously. Explained to patient and alerted her to next dose available. She stated she was still have 10/10 pain, generalized all over body. MD paged and a one-time dose of oxy ordered.  Patient refused the oxy, saying it was too strong and in the intermit her pain had subsided quite a bit. She felt she could wait until next time Tylenol could be given. Continue to monitor.

## 2022-03-13 NOTE — Plan of Care (Signed)
  Problem: Activity: Goal: Ability to tolerate increased activity will improve Outcome: Progressing   Problem: Cardiac: Goal: Ability to achieve and maintain adequate cardiovascular perfusion will improve Outcome: Progressing   Problem: Health Behavior/Discharge Planning: Goal: Ability to safely manage health-related needs after discharge will improve Outcome: Progressing   Problem: Coping: Goal: Level of anxiety will decrease Outcome: Progressing   Problem: Pain Managment: Goal: General experience of comfort will improve Outcome: Progressing

## 2022-03-13 NOTE — Progress Notes (Signed)
Subjective:  Patient denies any chest pain or shortness of breath remains in sinus rhythm eager to go home  Objective:  Vital Signs in the last 24 hours: Temp:  [97.8 F (36.6 C)-98.2 F (36.8 C)] 97.9 F (36.6 C) (09/10 1106) Pulse Rate:  [70-81] 74 (09/10 1106) Resp:  [14-25] 20 (09/10 1106) BP: (111-169)/(63-94) 146/88 (09/10 1106) SpO2:  [96 %-98 %] 97 % (09/10 1106)  Intake/Output from previous day: 09/09 0701 - 09/10 0700 In: 1320 [P.O.:1320] Out: -  Intake/Output from this shift: Total I/O In: 480 [P.O.:480] Out: -   Physical Exam: Exam unchanged  Lab Results: Recent Labs    03/12/22 0705  WBC 5.8  HGB 10.8*  PLT 184   Recent Labs    03/12/22 0705  NA 137  K 4.3  CL 109  CO2 24  GLUCOSE 210*  BUN 14  CREATININE 1.24*   No results for input(s): "TROPONINI" in the last 72 hours.  Invalid input(s): "CK", "MB" Hepatic Function Panel No results for input(s): "PROT", "ALBUMIN", "AST", "ALT", "ALKPHOS", "BILITOT", "BILIDIR", "IBILI" in the last 72 hours. No results for input(s): "CHOL" in the last 72 hours. No results for input(s): "PROTIME" in the last 72 hours.  Imaging: Imaging results have been reviewed and No results found.  Cardiac Studies:  Assessment/Plan:  Status post small non-Q wave myocardial infarction Compensated congestive heart failure secondary to preserved LV systolic function Paroxysmal atrial fibrillation CHA2DS2-VASc score of 5 Hypertension Type 2 diabetes mellitus Hyperlipidemia Chronic kidney disease. History of hypothyroidism COPD History of schizoaffective disorder/bipolar disorder Plan Continue present management Awaiting inpatient psych bed. Patient will discuss with Dr. Doylene Canard as outpatient regarding various options of treatment  LOS: 5 days    [name] Long 03/13/2022, 1:04 PM

## 2022-03-13 NOTE — TOC Progression Note (Addendum)
Transition of Care [facility]) - Progression Note    Patient Details  Name: [name] Long MRN: [id number] Date of Birth: 08-06-1950  Transition of Care [facility]) CM/SW [place], LCSW Phone Number: 03/13/2022, 11:20 AM  Clinical Narrative:     CSW called [facility] to follow up on referral being received. Intake staff informed CSW that referral had been denied due to the congestive heart failure.  CSW reached out to [facility] for any bed availability. No beds today at [facility]. Referrals will be re-faxed out again per [place].  TOC team will continue to assist with discharge planning needs.   Expected Discharge Plan: Psychiatric Hospital Barriers to Discharge: Psych Bed not available  Expected Discharge Plan and Services Expected Discharge Plan: [facility] In-house Referral: Clinical Social Work     Living arrangements for the past 2 months: [place]                                       Social Determinants of Health (SDOH) Interventions    Readmission Risk Interventions     No data to display
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
## 2022-03-13 NOTE — Progress Notes (Signed)
Mobility Specialist Progress Note    03/13/22 1640  Mobility  Activity Ambulated with assistance in hallway  Level of Assistance Standby assist, set-up cues, supervision of patient - no hands on  Assistive Device Front wheel walker  Distance Ambulated (ft) 420 ft  Activity Response Tolerated well  $Mobility charge 1 Mobility   Pre-Mobility: 76 HR  Pt received in chair and agreeable. No complaints on walk. Returned to chair with call bell in reach.    [name] Mobility Specialist

## 2022-03-14 DIAGNOSIS — I214 Non-ST elevation (NSTEMI) myocardial infarction: Secondary | ICD-10-CM | POA: Diagnosis not present

## 2022-03-14 LAB — GLUCOSE, CAPILLARY
Glucose-Capillary: 247 mg/dL — ABNORMAL HIGH (ref 70–99)
Glucose-Capillary: 226 mg/dL — ABNORMAL HIGH (ref 70–99)
Glucose-Capillary: 193 mg/dL — ABNORMAL HIGH (ref 70–99)
Glucose-Capillary: 239 mg/dL — ABNORMAL HIGH (ref 70–99)
Glucose-Capillary: 219 mg/dL — ABNORMAL HIGH (ref 70–99)
Glucose-Capillary: 125 mg/dL — ABNORMAL HIGH (ref 70–99)
Glucose-Capillary: 202 mg/dL — ABNORMAL HIGH (ref 70–99)
Glucose-Capillary: 159 mg/dL — ABNORMAL HIGH (ref 70–99)
Glucose-Capillary: 135 mg/dL — ABNORMAL HIGH (ref 70–99)
Glucose-Capillary: 144 mg/dL — ABNORMAL HIGH (ref 70–99)

## 2022-03-14 MED ORDER — DAPAGLIFLOZIN PROPANEDIOL 5 MG PO TABS
5.0000 mg | ORAL_TABLET | Freq: Every day | ORAL | Status: DC
  Administered 2022-03-14 – 2022-03-21 (×8): 5 mg via ORAL
  Filled 2022-03-14 (×8): qty 1

## 2022-03-14 MED ORDER — METFORMIN HCL 500 MG PO TABS
1000.0000 mg | ORAL_TABLET | Freq: Two times a day (BID) | ORAL | Status: DC
  Administered 2022-03-15 – 2022-03-21 (×11): 1000 mg via ORAL
  Filled 2022-03-14 (×12): qty 2
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
## 2022-03-14 NOTE — Care Management Important Message (Signed)
Important Message  Patient Details  Name: [name] Long MRN: [id number] Date of Birth: October 05, 1950   Medicare Important Message Given:  Yes   IM given on 9/08   Orbie Pyo 03/14/2022, 12:19 PM
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
## 2022-03-14 NOTE — Progress Notes (Signed)
Mobility Specialist Progress Note    03/14/22 1222  Mobility  Activity Ambulated with assistance in hallway  Level of Assistance Standby assist, set-up cues, supervision of patient - no hands on  Assistive Device Front wheel walker  Distance Ambulated (ft) 420 ft  Activity Response Tolerated well  $Mobility charge 1 Mobility   Pt received in chair and agreeable. No complaints on walk. Returned to chair with call bell in reach.    [name] Mobility Specialist

## 2022-03-14 NOTE — TOC Progression Note (Addendum)
Transition of Care [facility]) - Progression Note    Patient Details  Name: JAQUELYNN WANAMAKER MRN: [id number] Date of Birth: [birth year]  Transition of Care [facility]) CM/SW [place], [place] Phone Number: 03/14/2022, 12:19 PM  Clinical Narrative:     [facility] and [facility] have no appropriate beds available will fax updated referrals to external facilities.   Referrals faxed again to: [facility]- called, no referral was received yesterday. They did receive referral from today but no bed availability [facility]- called, informed they did not receive a referral yesterday.  [facility]- called, left message on [facility] intake line  requesting return call [facility]- no answer, no voicemail available [facility] [place]-called left message on [facility] intake line  requesting return call [facility]- called left message on [facility] intake line  requesting return call   Expected Discharge Plan: Psychiatric Hospital Barriers to Discharge: Psych Bed not available  Expected Discharge Plan and Services Expected Discharge Plan: [facility] In-house Referral: Clinical Social Work     Living arrangements for the past 2 months: [place]                                       Social Determinants of Health (SDOH) Interventions    Readmission Risk Interventions     No data to display

## 2022-03-14 NOTE — Consult Note (Signed)
Ref: Ngetich, Nelda Bucks, NP   Subjective:  Awake. Wanting to go home. Off some of the home medications including metformin.  Objective:  Vital Signs in the last 24 hours: Temp:  [97.6 F (36.4 C)-98.3 F (36.8 C)] 97.8 F (36.6 C) (09/11 1520) Pulse Rate:  [63-74] 74 (09/11 1520) Cardiac Rhythm: Normal sinus rhythm (09/10 2030) Resp:  [17-20] 18 (09/11 1520) BP: (110-148)/(65-85) 115/66 (09/11 1520) SpO2:  [98 %-99 %] 99 % (09/11 1520)  Physical Exam: BP Readings from Last 1 Encounters:  03/14/22 115/66     Wt Readings from Last 1 Encounters:  03/07/22 71.3 kg    Weight change:  Body mass index is 26.16 kg/m. HEENT: Gray/AT, Eyes-Brown, Conjunctiva-Pink, Sclera-Non-icteric Neck: No JVD, No bruit, Trachea midline. Lungs:  Clear, Bilateral. Cardiac:  Regular rhythm, normal S1 and S2, no S3. II/VI systolic murmur. Abdomen:  Soft, non-tender. BS present. Extremities:  Trace edema present. No cyanosis. No clubbing. CNS: AxOx3, Cranial nerves grossly intact, moves all 4 extremities.  Skin: Warm and dry.   Intake/Output from previous day: 09/10 0701 - 09/11 0700 In: 837 [P.O.:837] Out: 2 [Urine:2]    Lab Results: BMET    Component Value Date/Time   NA 137 03/12/2022 0705   NA 140 03/09/2022 0559   NA 139 03/08/2022 0915   NA 137 03/23/2020 1433   NA 141 03/12/2020 1529   NA 139 01/11/2019 1419   K 4.3 03/12/2022 0705   K 3.7 03/09/2022 0559   K 3.6 03/08/2022 0915   CL 109 03/12/2022 0705   CL 113 (H) 03/09/2022 0559   CL 112 (H) 03/08/2022 0915   CO2 24 03/12/2022 0705   CO2 22 03/09/2022 0559   CO2 19 (L) 03/08/2022 0915   GLUCOSE 210 (H) 03/12/2022 0705   GLUCOSE 126 (H) 03/09/2022 0559   GLUCOSE 164 (H) 03/08/2022 0915   BUN 14 03/12/2022 0705   BUN 18 03/09/2022 0559   BUN 19 03/08/2022 0915   BUN 25 03/23/2020 1433   BUN 16 03/12/2020 1529   BUN 17 01/11/2019 1419   CREATININE 1.24 (H) 03/12/2022 0705   CREATININE 1.39 (H) 03/09/2022 0559    CREATININE 1.45 (H) 03/08/2022 0915   CREATININE 2.39 (H) 01/22/2021 1531   CREATININE 1.18 (H) 10/01/2020 1147   CREATININE 1.20 (H) 05/26/2020 1553   CALCIUM 9.0 03/12/2022 0705   CALCIUM 8.8 (L) 03/09/2022 0559   CALCIUM 8.9 03/08/2022 0915   GFRNONAA 47 (L) 03/12/2022 0705   GFRNONAA 41 (L) 03/09/2022 0559   GFRNONAA 39 (L) 03/08/2022 0915   GFRNONAA 47 (L) 10/01/2020 1147   GFRNONAA 46 (L) 05/26/2020 1553   GFRNONAA 48 (L) 04/30/2020 1206   GFRAA 54 (L) 10/01/2020 1147   GFRAA 54 (L) 05/26/2020 1553   GFRAA 56 (L) 04/30/2020 1206   CBC    Component Value Date/Time   WBC 5.8 03/12/2022 0705   RBC 3.37 (L) 03/12/2022 0705   HGB 10.8 (L) 03/12/2022 0705   HCT 32.6 (L) 03/12/2022 0705   PLT 184 03/12/2022 0705   MCV 96.7 03/12/2022 0705   MCH 32.0 03/12/2022 0705   MCHC 33.1 03/12/2022 0705   RDW 14.9 03/12/2022 0705   LYMPHSABS 0.7 03/07/2022 1047   MONOABS 0.2 03/07/2022 1047   EOSABS 0.0 03/07/2022 1047   BASOSABS 0.0 03/07/2022 1047   HEPATIC Function Panel Recent Labs    05/07/21 1415 03/07/22 1047 03/08/22 0915  PROT 6.4* 7.3 6.7  ALBUMIN 2.8* 3.4* 3.1*  AST  $'16 18 21  'N$ ALT '12 15 14  '$ ALKPHOS 92 72 76   HEMOGLOBIN A1C Lab Results  Component Value Date   MPG 231.69 03/07/2022   CARDIAC ENZYMES Lab Results  Component Value Date   CKTOTAL 251 (H) 03/07/2022   TROPONINI <0.03 12/26/2016   TROPONINI <0.03 12/25/2016   BNP No results for input(s): "PROBNP" in the last 8760 hours. TSH Recent Labs    04/19/21 1933 03/07/22 1630  TSH 1.615 0.471   CHOLESTEROL Recent Labs    04/19/21 1933 03/08/22 0014  CHOL 166 215*    Scheduled Meds:  apixaban  5 mg Oral BID   aspirin EC  81 mg Oral Daily   dapagliflozin propanediol  5 mg Oral Daily   divalproex  250 mg Oral Q12H   insulin aspart  0-9 Units Subcutaneous TID WC   isosorbide mononitrate  15 mg Oral Daily   [START ON 03/15/2022] metFORMIN  1,000 mg Oral BID WC   metoprolol succinate  50 mg  Oral Daily   paliperidone  6 mg Oral QHS   potassium chloride  20 mEq Oral Once   rosuvastatin  40 mg Oral Daily   Continuous Infusions:  PRN Meds:.acetaminophen, albuterol, benzonatate, bismuth subsalicylate, haloperidol lactate, hydrALAZINE, nitroGLYCERIN, ondansetron (ZOFRAN) IV, mouth rinse  Assessment/Plan:  Small non-Q wave MI Chronic diastolic left heart failure Paroxysmal atrial fibrillation Type 2 DM HLD HTN COPD Bipolar disorder  Plan: Consider psych admission or close follow up. Resume Metformin and Farxiga. Discussed importance of taking heart, diabetes and psych medications.    LOS: 6 days   Time spent including chart review, lab review, examination, discussion with patient/Family : 30 min   Dixie Dials  MD  03/14/2022, 5:42 PM

## 2022-03-14 NOTE — Inpatient Diabetes Management (Signed)
Inpatient Diabetes Program Recommendations  AACE/ADA: New Consensus Statement on Inpatient Glycemic Control (2015)  Target Ranges:  Prepandial:   less than 140 mg/dL      Peak postprandial:   less than 180 mg/dL (1-2 hours)      Critically ill patients:  140 - 180 mg/dL   Lab Results  Component Value Date   GLUCAP 202 (H) 03/14/2022   HGBA1C 9.7 (H) 03/07/2022    Review of Glycemic Control  Latest Reference Range & Units 03/13/22 15:48 03/13/22 21:01 03/14/22 06:33 03/14/22 08:24  Glucose-Capillary 70 - 99 mg/dL 225 (H) 219 (H) 125 (H) 202 (H)  (H): Data is abnormally high Diabetes history: Type 2 DM Outpatient Diabetes medications: Farxiga 5 mg QD, Metformin 1000 mg BID ( NT) Current orders for Inpatient glycemic control: Novolog 0-9 units TID  Inpatient Diabetes Program Recommendations:    Consider adding Levemir 6 units QD and Novolog 0-5 units QHS.  Thanks, Bronson Curb, MSN, RNC-OB Diabetes Coordinator [phone] (8a-5p)

## 2022-03-14 NOTE — Plan of Care (Signed)
  Problem: Activity: Goal: Ability to tolerate increased activity will improve Outcome: Progressing   Problem: Cardiac: Goal: Ability to achieve and maintain adequate cardiovascular perfusion will improve Outcome: Progressing   Problem: Fluid Volume: Goal: Ability to maintain a balanced intake and output will improve Outcome: Progressing   Problem: Nutritional: Goal: Maintenance of adequate nutrition will improve Outcome: Progressing

## 2022-03-15 ENCOUNTER — Other Ambulatory Visit: Payer: Self-pay

## 2022-03-15 DIAGNOSIS — I214 Non-ST elevation (NSTEMI) myocardial infarction: Secondary | ICD-10-CM | POA: Diagnosis not present

## 2022-03-15 DIAGNOSIS — M170 Bilateral primary osteoarthritis of knee: Secondary | ICD-10-CM

## 2022-03-15 LAB — BASIC METABOLIC PANEL
Anion gap: 7 (ref 5–15)
BUN: 18 mg/dL (ref 8–23)
CO2: 26 mmol/L (ref 22–32)
Calcium: 9.3 mg/dL (ref 8.9–10.3)
Chloride: 104 mmol/L (ref 98–111)
Creatinine, Ser: 1.38 mg/dL — ABNORMAL HIGH (ref 0.44–1.00)
GFR, Estimated: 41 mL/min — ABNORMAL LOW (ref >60)
Glucose, Bld: 178 mg/dL — ABNORMAL HIGH (ref 70–99)
Potassium: 4.5 mmol/L (ref 3.5–5.1)
Sodium: 137 mmol/L (ref 135–145)

## 2022-03-15 LAB — GLUCOSE, CAPILLARY
Glucose-Capillary: 141 mg/dL — ABNORMAL HIGH (ref 70–99)
Glucose-Capillary: 190 mg/dL — ABNORMAL HIGH (ref 70–99)
Glucose-Capillary: 130 mg/dL — ABNORMAL HIGH (ref 70–99)
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
## 2022-03-15 NOTE — TOC Progression Note (Addendum)
Transition of Care [facility]) - Progression Note    Patient Details  Name: [name] Long MRN: [id number] Date of Birth: [birth year]  Transition [facility] Care [facility]) CM/SW [place], [place] Phone Number: 03/15/2022, 1:26 PM  Clinical Narrative:       [facility] beds [facility]  [facility]- called and left message with secretary requesting call back from intake.  [facility]- called again, no answer, no voicemail available though they do not have [employer]. [facility] and left message requesting return call.   CSW sent referral to 3 [facility] facilities yesterdayMikel Cella, [place], [place]). Their referral line specifically states that if they have beds they will return call to number on faxed referral. CSW already left a message on their referral line anyways on 03/14/22. Historically, this CSW has never received a call back from faxed referrals or messages left of referral line.   [place] transferred to [employer] in their intake. no answer; left message requesting return call. [id number]: received call back; [facility] has no geri beds available.   CSW faxed additional referrals to the following:  [facility]          Expected Discharge Plan: Psychiatric [facility] Barriers to Discharge: Psych Bed not available  Expected Discharge Plan and Services Expected Discharge Plan: [facility] In-house Referral: Clinical Social Work     Living arrangements for the past 2 months: [place]                                       Social Determinants of Health (SDOH) Interventions    Readmission Risk Interventions     No data to display

## 2022-03-16 DIAGNOSIS — I214 Non-ST elevation (NSTEMI) myocardial infarction: Secondary | ICD-10-CM | POA: Diagnosis not present

## 2022-03-16 LAB — VALPROIC ACID LEVEL: Valproic Acid Lvl: <10 ug/mL — ABNORMAL LOW (ref 50.0–100.0)

## 2022-03-16 LAB — GLUCOSE, CAPILLARY
Glucose-Capillary: 133 mg/dL — ABNORMAL HIGH (ref 70–99)
Glucose-Capillary: 155 mg/dL — ABNORMAL HIGH (ref 70–99)
Glucose-Capillary: 153 mg/dL — ABNORMAL HIGH (ref 70–99)
Glucose-Capillary: 125 mg/dL — ABNORMAL HIGH (ref 70–99)
Glucose-Capillary: 171 mg/dL — ABNORMAL HIGH (ref 70–99)

## 2022-03-16 MED ORDER — PALIPERIDONE PALMITATE ER 156 MG/ML IM SUSY
156.0000 mg | PREFILLED_SYRINGE | Freq: Once | INTRAMUSCULAR | Status: DC
  Filled 2022-03-16 (×2): qty 1

## 2022-03-16 MED ORDER — PALIPERIDONE ER 6 MG PO TB24
6.0000 mg | ORAL_TABLET | Freq: Every day | ORAL | Status: DC
  Administered 2022-03-16 – 2022-03-20 (×5): 6 mg via ORAL
  Filled 2022-03-16 (×6): qty 1
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
## 2022-03-16 NOTE — Progress Notes (Signed)
Patient transferring to lower 2 west for a bed with lower level of care. Initially, patient was argumentative about the transfer. After some talking, she was agreeable. Transferred in wheelchair to [place] in no distress.

## 2022-03-16 NOTE — TOC Progression Note (Signed)
Transition of Care [facility]) - Progression Note    Patient Details  Name: [name] Long MRN: [id number] Date of Birth: Jan 31, 1951  Transition of Care [facility]) CM/SW [place], [place] Phone Number: 03/16/2022, 11:12 AM  Clinical Narrative:     Assist psych in renewing IVC. IVC documents faxed to magistrate and signed IVC orders returned by fax from magistrate. CSW placed 4 copies on chart and called law enforcement to serve papers. Charge nurse notified. IVC will expire 03/22/22 at 524pm  Expected Discharge Plan: Psychiatric Hospital Barriers to Discharge: Psych Bed not available  Expected Discharge Plan and Services Expected Discharge Plan: [facility] In-house Referral: Clinical Social Work     Living arrangements for the past 2 months: [place]                                       Social Determinants of Health (SDOH) Interventions    Readmission Risk Interventions     No data to display

## 2022-03-16 NOTE — Progress Notes (Signed)
Mobility Specialist Progress Note    03/16/22 1003  Mobility  Activity Ambulated with assistance in hallway  Level of Assistance Standby assist, set-up cues, supervision of patient - no hands on  Assistive Device Front wheel walker  Distance Ambulated (ft) 420 ft  Activity Response Tolerated well  $Mobility charge 1 Mobility   Pt received in chair and agreeable. Pt more SOB with exertion than usual. C/o 8/10 throat pain that is moving to her vertebrae. Pt stated it is because she has not been getting her hypothyroidism medicine because the african night doctor erased it out her chart so she would stay longer in the hospital because he likes her. Took a few short standing rest breaks throughout to catch her breath. Returned to chair with call bell in reach.   [name] Mobility Specialist
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
## 2022-03-16 NOTE — Consult Note (Deleted)
Psychiatry brief consult note: Patient seen at bedside, and initially speaking aloud to herself.  She endorses that she was thinking aloud, but sitter reports that patient has been responding to internal stimuli, telling the voices to "be quiet" when another person began to speak to her.  Today, patient denies SI, HI, and AVH despite appearing to respond to internal stimuli.  Overall, patient continues to be less paranoid and more logical, although she does derail into paranoid statements throughout the assessment.  At the advising of her nurse, we discuss starting the [place].  Patient had questions about its indication, follow-up, and who would administer the injection, which were answered.  She was advised that after the first 2 loading doses, it would be a monthly medication, and in lieu of further pills.  She was then amenable to receiving the medication.  When her nurse attempted to give her the [place] a second time, patient declined stating that she was afraid of the needle.  Collateral:   Daughter, [name] [phone]):   Cassandra visited with her mom yesterday, feels as though she is coming back around, but was still endorsing some paranoia. Daughter had concerns about patient's noncompliance with Depakote and it not being given without her knowledge. Informed daughter that it   Spoke to daughter about patient's renal function and starting a lower dose of the [place] loading dose in light of decreased CrCl. Informed her that patient previously tolerated the increased dose of medication without renal dosing and had no subsequent decompensation. She was agreeable to proceeding with giving the LAI at the decreased dose. All of her questions were answered, and she was appreciative of the call.  Recommendations: -- START Invega Sustenna 156 mg/ml, followed by 117 mg/ml in 3-7 days in light of decreased CrCl. Patient has previously tolerated increased dosages of the medication  with a decreased CrCl without decompensation.   - As patient has continued to refuse, will continue Invega 6 mg p.o. until initiation of LAI. -- DISCONTINUE Depakote sprinkles 250 mg q 12 hours, as patient suspicious of RN staff placing it in her food. -Patient is currently being reviewed by [facility], as she is still recommended for inpatient geropsych due to lack of medication compliance and need for safe disposition planning. -- SW to kindly assist with establishing ACTT services.    Rosezetta Schlatter, MD PGY-2 03/16/2022  8:28 AM [place] Department of Psychiatry

## 2022-03-16 NOTE — Plan of Care (Signed)

## 2022-03-16 NOTE — Progress Notes (Signed)
Patient's IV was painful, and night nurse removed IV. Patient did not want another IV in. Okay to leave IV out per Dr. Candiss Norse.

## 2022-03-16 NOTE — Plan of Care (Signed)
  Problem: Activity: Goal: Ability to tolerate increased activity will improve Outcome: Progressing   Problem: Cardiac: Goal: Ability to achieve and maintain adequate cardiovascular perfusion will improve Outcome: Progressing   Problem: Coping: Goal: Ability to adjust to condition or change in health will improve Outcome: Progressing   Problem: Fluid Volume: Goal: Ability to maintain a balanced intake and output will improve Outcome: Progressing
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
## 2022-03-17 DIAGNOSIS — I214 Non-ST elevation (NSTEMI) myocardial infarction: Secondary | ICD-10-CM | POA: Diagnosis not present

## 2022-03-17 LAB — GLUCOSE, CAPILLARY: Glucose-Capillary: 175 mg/dL — ABNORMAL HIGH (ref 70–99)

## 2022-03-17 MED ORDER — HALOPERIDOL 5 MG PO TABS
5.0000 mg | ORAL_TABLET | Freq: Once | ORAL | Status: AC
  Administered 2022-03-17: 5 mg via ORAL
  Filled 2022-03-17 (×2): qty 1

## 2022-03-17 NOTE — TOC Progression Note (Addendum)
Transition of Care [facility]) - Progression Note    Patient Details  Name: [name] Long MRN: [id number] Date of Birth: May 30, 1951  [facility]) CM/SW [place], RN Phone Number: 03/17/2022, 9:39 AM  Clinical Narrative:    CM placed IVC paperwork in the patient's chart - IVC paperwork in date through 03/22/2022.  I called [facility] and they will follow up with me regarding bed availability.  Sitter remains at the bedside for safety.  03/17/2022 1043- I called and spoke with Kerry Dory, CM at [facility] and they do not have available beds today.  Attending MD, Dr. Candiss Norse was notified and CM will follow up at [facility] in the morning to check on bed availability.   Expected Discharge Plan: Psychiatric Hospital Barriers to Discharge: Psych Bed not available  Expected Discharge Plan and Services Expected Discharge Plan: [facility] In-house Referral: Clinical Social Work     Living arrangements for the past 2 months: [place]                                       Social Determinants of Health (SDOH) Interventions    Readmission Risk Interventions     No data to display
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
## 2022-03-17 NOTE — Progress Notes (Signed)
Pt has been getting agitated and being under the assumption that she is going home. Writer redirected pt several times, but pt wanted to go to the Nurse's station and leave a note. Note given to secretary then passed to RN. RN and Network engineer made pt aware that the note will be passed to MD. Pt happy with this and walked down the hallway and back into her room.   Writer has attempted most of day to talk, soothe, offer emotional support and even distract pt with music to clear their mind but pt is determined in leaving.

## 2022-03-18 DIAGNOSIS — I214 Non-ST elevation (NSTEMI) myocardial infarction: Secondary | ICD-10-CM | POA: Diagnosis not present

## 2022-03-18 LAB — CBC
HCT: 35.9 % — ABNORMAL LOW (ref 36.0–46.0)
Hemoglobin: 11.6 g/dL — ABNORMAL LOW (ref 12.0–15.0)
MCH: 31.6 pg (ref 26.0–34.0)
MCHC: 32.3 g/dL (ref 30.0–36.0)
MCV: 97.8 fL (ref 80.0–100.0)
Platelets: 230 10*3/uL (ref 150–400)
RBC: 3.67 MIL/uL — ABNORMAL LOW (ref 3.87–5.11)
RDW: 14.5 % (ref 11.5–15.5)
WBC: 5.9 10*3/uL (ref 4.0–10.5)
nRBC: 0 % (ref 0.0–0.2)

## 2022-03-18 LAB — BASIC METABOLIC PANEL
Anion gap: 9 (ref 5–15)
BUN: 17 mg/dL (ref 8–23)
CO2: 25 mmol/L (ref 22–32)
Calcium: 9.4 mg/dL (ref 8.9–10.3)
Chloride: 101 mmol/L (ref 98–111)
Creatinine, Ser: 1.36 mg/dL — ABNORMAL HIGH (ref 0.44–1.00)
GFR, Estimated: 42 mL/min — ABNORMAL LOW (ref >60)
Glucose, Bld: 143 mg/dL — ABNORMAL HIGH (ref 70–99)
Potassium: 4.2 mmol/L (ref 3.5–5.1)
Sodium: 135 mmol/L (ref 135–145)

## 2022-03-18 LAB — GLUCOSE, CAPILLARY
Glucose-Capillary: 138 mg/dL — ABNORMAL HIGH (ref 70–99)
Glucose-Capillary: 166 mg/dL — ABNORMAL HIGH (ref 70–99)

## 2022-03-18 LAB — MAGNESIUM: Magnesium: 2 mg/dL (ref 1.7–2.4)
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
## 2022-03-18 NOTE — Progress Notes (Signed)
CSW spoke with Estill Bamberg, RN at [facility] who states contact must be made with patient's family to confirm post hospitalization discharge plan. MD will reconsider this patient on Monday for admission.  CSW faxed patient's clinicals to [place], [place], [place] for review.  Madilyn Fireman, MSW, LCSW Transitions of Care  Clinical Social Worker II [phone]
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
## 2022-03-18 NOTE — TOC Progression Note (Signed)
Transition of Care [facility]) - Progression Note    Patient Details  Name: [name] Long MRN: [id number] Date of Birth: 04-04-1951  Transition of Care [facility] CM/SW [place], RN Phone Number: 03/18/2022, 11:33 AM  Clinical Narrative:    CM called and spoke with the patient's daughter, Syenna Nazir (Legal guardian) on the phone and the patient was living by herself in the home with family staying with her intermittently at the apartment.  The patient's niece, Berthe Oley was staying with the patient when she was admitted to the hospital for care - Shikara Mcauliffe does not have a phone to contact but the Legal guardian can be reached by phone.  The patient's daughter, Maizy Davanzo, would like the patient admitted to an inpatient psychiatric facility for care but if she is cleared by psychiatry at a later date - the patient will be safe to return to her apartment listed on the facesheet.  The patient lives alone and occasionally uses a cane or rolling walker for mobility as needed.  Transportation - the patient uses Medicaid transportation or cab for medical transportation or appointments.  No Inpatient psychiatry bed is available at this time but CM and MSW with [facility] Team will continue to follow the patient for Inpatient Geriatric psychiatry bed placement.   Expected Discharge Plan: Psychiatric Hospital Barriers to Discharge: Continued Medical Work up  Expected Discharge Plan and Services Expected Discharge Plan: [facility] In-house Referral: Clinical Social Work Discharge Planning Services: CM Consult   Living arrangements for the past 2 months: Apartment (Living alone in her apartment with family staying with her intermittantly)                                       Social Determinants of Health (SDOH) Interventions    Readmission Risk Interventions     No data to display

## 2022-03-19 DIAGNOSIS — I214 Non-ST elevation (NSTEMI) myocardial infarction: Secondary | ICD-10-CM | POA: Diagnosis not present

## 2022-03-19 LAB — GLUCOSE, CAPILLARY: Glucose-Capillary: 148 mg/dL — ABNORMAL HIGH (ref 70–99)

## 2022-03-19 MED ORDER — AMLODIPINE BESYLATE 5 MG PO TABS
5.0000 mg | ORAL_TABLET | Freq: Every day | ORAL | Status: DC
  Administered 2022-03-19 – 2022-03-21 (×3): 5 mg via ORAL
  Filled 2022-03-19 (×3): qty 1
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
## 2022-03-20 DIAGNOSIS — F250 Schizoaffective disorder, bipolar type: Secondary | ICD-10-CM | POA: Diagnosis not present

## 2022-03-20 DIAGNOSIS — I214 Non-ST elevation (NSTEMI) myocardial infarction: Secondary | ICD-10-CM | POA: Diagnosis not present

## 2022-03-20 LAB — GLUCOSE, CAPILLARY
Glucose-Capillary: 181 mg/dL — ABNORMAL HIGH (ref 70–99)
Glucose-Capillary: 207 mg/dL — ABNORMAL HIGH (ref 70–99)
Glucose-Capillary: 134 mg/dL — ABNORMAL HIGH (ref 70–99)

## 2022-03-20 MED ORDER — DIVALPROEX SODIUM 125 MG PO CSDR
250.0000 mg | DELAYED_RELEASE_CAPSULE | Freq: Two times a day (BID) | ORAL | Status: DC
  Administered 2022-03-20 – 2022-03-21 (×2): 250 mg via ORAL
  Filled 2022-03-20 (×4): qty 2

## 2022-03-20 MED ORDER — ZIPRASIDONE MESYLATE 20 MG IM SOLR: 10.0000 mg | Freq: Four times a day (QID) | INTRAMUSCULAR | Status: DC | PRN
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
## 2022-03-20 NOTE — Progress Notes (Signed)
Patient's IVC expires 03/22/2022.   Patient has not received any bed offers for inpatient psychiatric treatment.  [facility] to reassess on Monday for possible admission.  Madilyn Fireman, MSW, LCSW Transitions of Care  Clinical Social Worker II [phone]

## 2022-03-21 ENCOUNTER — Inpatient Hospital Stay
Admission: RE | Admit: 2022-03-21 | Discharge: 2022-04-04 | DRG: 885 | Disposition: A | Discharge status: Home / Self Care | Payer: Medicare Other | Source: Intra-hospital | Attending: Psychiatry | Admitting: Psychiatry

## 2022-03-21 ENCOUNTER — Telehealth: Payer: Self-pay | Admitting: Radiology

## 2022-03-21 ENCOUNTER — Ambulatory Visit: Payer: Medicare Other | Admitting: Orthopedic Surgery

## 2022-03-21 ENCOUNTER — Inpatient Hospital Stay: Admission: RE | Admit: 2022-03-21 | Payer: Medicare Other | Source: Intra-hospital | Admitting: Psychiatry

## 2022-03-21 ENCOUNTER — Other Ambulatory Visit: Payer: Self-pay

## 2022-03-21 ENCOUNTER — Encounter: Payer: Self-pay | Admitting: Psychiatry

## 2022-03-21 DIAGNOSIS — E7800 Pure hypercholesterolemia, unspecified: Secondary | ICD-10-CM | POA: Diagnosis present

## 2022-03-21 DIAGNOSIS — N182 Chronic kidney disease, stage 2 (mild): Secondary | ICD-10-CM | POA: Diagnosis present

## 2022-03-21 DIAGNOSIS — I130 Hypertensive heart and chronic kidney disease with heart failure and stage 1 through stage 4 chronic kidney disease, or unspecified chronic kidney disease: Secondary | ICD-10-CM | POA: Diagnosis present

## 2022-03-21 DIAGNOSIS — R4182 Altered mental status, unspecified: Secondary | ICD-10-CM | POA: Diagnosis not present

## 2022-03-21 DIAGNOSIS — E1122 Type 2 diabetes mellitus with diabetic chronic kidney disease: Secondary | ICD-10-CM | POA: Diagnosis present

## 2022-03-21 DIAGNOSIS — Z7982 Long term (current) use of aspirin: Secondary | ICD-10-CM | POA: Diagnosis not present

## 2022-03-21 DIAGNOSIS — Z794 Long term (current) use of insulin: Secondary | ICD-10-CM | POA: Diagnosis not present

## 2022-03-21 DIAGNOSIS — I214 Non-ST elevation (NSTEMI) myocardial infarction: Secondary | ICD-10-CM | POA: Diagnosis not present

## 2022-03-21 DIAGNOSIS — K219 Gastro-esophageal reflux disease without esophagitis: Secondary | ICD-10-CM | POA: Diagnosis present

## 2022-03-21 DIAGNOSIS — N183 Chronic kidney disease, stage 3 unspecified: Secondary | ICD-10-CM

## 2022-03-21 DIAGNOSIS — Z7901 Long term (current) use of anticoagulants: Secondary | ICD-10-CM | POA: Diagnosis not present

## 2022-03-21 DIAGNOSIS — J4489 Other specified chronic obstructive pulmonary disease: Secondary | ICD-10-CM | POA: Diagnosis present

## 2022-03-21 DIAGNOSIS — I4891 Unspecified atrial fibrillation: Secondary | ICD-10-CM | POA: Diagnosis present

## 2022-03-21 DIAGNOSIS — E039 Hypothyroidism, unspecified: Secondary | ICD-10-CM | POA: Diagnosis present

## 2022-03-21 DIAGNOSIS — Z7984 Long term (current) use of oral hypoglycemic drugs: Secondary | ICD-10-CM | POA: Diagnosis not present

## 2022-03-21 DIAGNOSIS — F250 Schizoaffective disorder, bipolar type: Principal | ICD-10-CM | POA: Diagnosis present

## 2022-03-21 DIAGNOSIS — Z86010 Personal history of colonic polyps: Secondary | ICD-10-CM

## 2022-03-21 DIAGNOSIS — F17210 Nicotine dependence, cigarettes, uncomplicated: Secondary | ICD-10-CM | POA: Diagnosis present

## 2022-03-21 LAB — GLUCOSE, CAPILLARY
Glucose-Capillary: 108 mg/dL — ABNORMAL HIGH (ref 70–99)
Glucose-Capillary: 159 mg/dL — ABNORMAL HIGH (ref 70–99)
Glucose-Capillary: 106 mg/dL — ABNORMAL HIGH (ref 70–99)

## 2022-03-21 LAB — SARS CORONAVIRUS 2 BY RT PCR: SARS Coronavirus 2 by RT PCR: NEGATIVE

## 2022-03-21 MED ORDER — ROSUVASTATIN CALCIUM 20 MG PO TABS
40.0000 mg | ORAL_TABLET | Freq: Every day | ORAL | Status: DC
  Administered 2022-03-22 – 2022-04-04 (×14): 40 mg via ORAL
  Filled 2022-03-21 (×14): qty 2

## 2022-03-21 MED ORDER — APIXABAN 5 MG PO TABS: 5.0000 mg | ORAL_TABLET | Freq: Two times a day (BID) | ORAL | Status: DC

## 2022-03-21 MED ORDER — BISMUTH SUBSALICYLATE 262 MG/15ML PO SUSP
30.0000 mL | Freq: Three times a day (TID) | ORAL | Status: DC | PRN
  Administered 2022-03-24 – 2022-04-01 (×11): 30 mL via ORAL
  Filled 2022-03-21 (×3): qty 118

## 2022-03-21 MED ORDER — NITROGLYCERIN 0.4 MG SL SUBL: 0.4000 mg | SUBLINGUAL_TABLET | SUBLINGUAL | Status: DC | PRN

## 2022-03-21 MED ORDER — DIVALPROEX SODIUM 125 MG PO CSDR: 250.0000 mg | DELAYED_RELEASE_CAPSULE | Freq: Two times a day (BID) | ORAL | Status: DC

## 2022-03-21 MED ORDER — AMLODIPINE BESYLATE 5 MG PO TABS
5.0000 mg | ORAL_TABLET | Freq: Every day | ORAL | Status: DC
  Administered 2022-03-22 – 2022-04-04 (×14): 5 mg via ORAL
  Filled 2022-03-21 (×15): qty 1

## 2022-03-21 MED ORDER — APIXABAN 5 MG PO TABS
5.0000 mg | ORAL_TABLET | Freq: Two times a day (BID) | ORAL | Status: DC
  Administered 2022-03-21 – 2022-04-04 (×27): 5 mg via ORAL
  Filled 2022-03-21 (×29): qty 1

## 2022-03-21 MED ORDER — PALIPERIDONE ER 6 MG PO TB24: 6.0000 mg | ORAL_TABLET | Freq: Every day | ORAL | Status: DC

## 2022-03-21 MED ORDER — METFORMIN HCL 1000 MG PO TABS: 1000.0000 mg | ORAL_TABLET | Freq: Two times a day (BID) | ORAL | 2 refills | Status: DC

## 2022-03-21 MED ORDER — PANTOPRAZOLE SODIUM 40 MG PO TBEC: 40.0000 mg | DELAYED_RELEASE_TABLET | Freq: Every day | ORAL | 2 refills | Status: DC

## 2022-03-21 MED ORDER — PALIPERIDONE ER 3 MG PO TB24
6.0000 mg | ORAL_TABLET | Freq: Every day | ORAL | Status: DC
  Administered 2022-03-21 – 2022-03-22 (×2): 6 mg via ORAL
  Filled 2022-03-21 (×2): qty 2

## 2022-03-21 MED ORDER — ISOSORBIDE MONONITRATE ER 30 MG PO TB24
15.0000 mg | ORAL_TABLET | Freq: Every day | ORAL | Status: DC
  Administered 2022-03-22 – 2022-04-04 (×14): 15 mg via ORAL
  Filled 2022-03-21 (×14): qty 1

## 2022-03-21 MED ORDER — ZIPRASIDONE MESYLATE 20 MG IM SOLR: 10.0000 mg | Freq: Four times a day (QID) | INTRAMUSCULAR | Status: DC | PRN

## 2022-03-21 MED ORDER — METFORMIN HCL 500 MG PO TABS
1000.0000 mg | ORAL_TABLET | Freq: Two times a day (BID) | ORAL | Status: DC
  Administered 2022-03-21 – 2022-04-04 (×28): 1000 mg via ORAL
  Filled 2022-03-21 (×29): qty 2

## 2022-03-21 MED ORDER — ALUM & MAG HYDROXIDE-SIMETH 200-200-20 MG/5ML PO SUSP
30.0000 mL | ORAL | Status: DC | PRN
  Administered 2022-04-01: 30 mL via ORAL
  Filled 2022-03-21: qty 30

## 2022-03-21 MED ORDER — MAGNESIUM HYDROXIDE 400 MG/5ML PO SUSP: 30.0000 mL | Freq: Every day | ORAL | Status: DC | PRN

## 2022-03-21 MED ORDER — DAPAGLIFLOZIN PROPANEDIOL 5 MG PO TABS
5.0000 mg | ORAL_TABLET | Freq: Every day | ORAL | Status: DC
  Administered 2022-03-22 – 2022-04-04 (×14): 5 mg via ORAL
  Filled 2022-03-21 (×14): qty 1

## 2022-03-21 MED ORDER — INSULIN ASPART 100 UNIT/ML FLEXPEN: PEN_INJECTOR | SUBCUTANEOUS | 0 refills | Status: DC

## 2022-03-21 MED ORDER — ALBUTEROL SULFATE (2.5 MG/3ML) 0.083% IN NEBU
2.5000 mg | INHALATION_SOLUTION | Freq: Four times a day (QID) | RESPIRATORY_TRACT | Status: DC | PRN
  Filled 2022-03-21: qty 3

## 2022-03-21 MED ORDER — ROSUVASTATIN CALCIUM 40 MG PO TABS: 40.0000 mg | ORAL_TABLET | Freq: Every day | ORAL | 2 refills | Status: DC

## 2022-03-21 MED ORDER — METOPROLOL SUCCINATE ER 25 MG PO TB24
50.0000 mg | ORAL_TABLET | Freq: Every day | ORAL | Status: DC
  Administered 2022-03-22 – 2022-04-04 (×14): 50 mg via ORAL
  Filled 2022-03-21 (×15): qty 2

## 2022-03-21 MED ORDER — ACETAMINOPHEN 325 MG PO TABS
650.0000 mg | ORAL_TABLET | Freq: Four times a day (QID) | ORAL | Status: DC | PRN
  Administered 2022-03-22 (×2): 650 mg via ORAL

## 2022-03-21 MED ORDER — INSULIN ASPART 100 UNIT/ML IJ SOLN
0.0000 [IU] | Freq: Three times a day (TID) | INTRAMUSCULAR | Status: DC
  Administered 2022-03-22 (×2): 2 [IU] via SUBCUTANEOUS
  Administered 2022-03-23 (×2): 1 [IU] via SUBCUTANEOUS
  Administered 2022-03-24: 2 [IU] via SUBCUTANEOUS
  Administered 2022-03-25: 3 [IU] via SUBCUTANEOUS
  Administered 2022-03-25: 2 [IU] via SUBCUTANEOUS
  Administered 2022-03-27: 1 [IU] via SUBCUTANEOUS
  Administered 2022-03-27 (×2): 2 [IU] via SUBCUTANEOUS
  Administered 2022-03-28: 3 [IU] via SUBCUTANEOUS
  Administered 2022-03-29 (×2): 1 [IU] via SUBCUTANEOUS
  Administered 2022-03-29: 2 [IU] via SUBCUTANEOUS
  Administered 2022-03-30: 1 [IU] via SUBCUTANEOUS
  Administered 2022-03-30 – 2022-03-31 (×2): 2 [IU] via SUBCUTANEOUS
  Administered 2022-04-01: 1 [IU] via SUBCUTANEOUS
  Administered 2022-04-01: 2 [IU] via SUBCUTANEOUS
  Administered 2022-04-02 (×2): 1 [IU] via SUBCUTANEOUS
  Administered 2022-04-03 – 2022-04-04 (×3): 2 [IU] via SUBCUTANEOUS
  Filled 2022-03-21 (×23): qty 1

## 2022-03-21 MED ORDER — ACETAMINOPHEN 325 MG PO TABS
650.0000 mg | ORAL_TABLET | Freq: Four times a day (QID) | ORAL | Status: DC | PRN
  Administered 2022-03-22 – 2022-04-04 (×27): 650 mg via ORAL
  Filled 2022-03-21 (×32): qty 2

## 2022-03-21 MED ORDER — ALBUTEROL SULFATE HFA 108 (90 BASE) MCG/ACT IN AERS
2.0000 | INHALATION_SPRAY | Freq: Four times a day (QID) | RESPIRATORY_TRACT | Status: DC | PRN
  Administered 2022-03-21 – 2022-04-03 (×14): 2 via RESPIRATORY_TRACT
  Filled 2022-03-21: qty 6.7

## 2022-03-21 MED ORDER — AMLODIPINE BESYLATE 5 MG PO TABS: 5.0000 mg | ORAL_TABLET | Freq: Every day | ORAL | Status: DC

## 2022-03-21 MED ORDER — ASPIRIN 81 MG PO TBEC
81.0000 mg | DELAYED_RELEASE_TABLET | Freq: Every day | ORAL | Status: DC
  Administered 2022-03-22 – 2022-04-04 (×13): 81 mg via ORAL
  Filled 2022-03-21 (×15): qty 1

## 2022-03-21 MED ORDER — ORAL CARE MOUTH RINSE: 15.0000 mL | OROMUCOSAL | Status: DC | PRN

## 2022-03-21 MED ORDER — BENZONATATE 100 MG PO CAPS: 200.0000 mg | ORAL_CAPSULE | Freq: Three times a day (TID) | ORAL | Status: DC | PRN

## 2022-03-21 MED ORDER — DIVALPROEX SODIUM 125 MG PO CSDR
250.0000 mg | DELAYED_RELEASE_CAPSULE | Freq: Two times a day (BID) | ORAL | Status: DC
  Administered 2022-03-21 – 2022-03-23 (×4): 250 mg via ORAL
  Filled 2022-03-21 (×4): qty 2

## 2022-03-21 MED ORDER — ISOSORBIDE MONONITRATE ER 30 MG PO TB24: 15.0000 mg | ORAL_TABLET | Freq: Every day | ORAL | Status: DC

## 2022-03-21 NOTE — Telephone Encounter (Signed)
Noted. Will hold for Autumn to review.

## 2022-03-21 NOTE — Group Note (Deleted)
[facility] LCSW Group Therapy Note    Group Date: 03/21/2022 Start Time:  1:00 PM End Time:  2:00 PM  Type of Therapy and Topic:  Group Therapy:  Overcoming Obstacles  Participation Level:  {[facility] PARTICIPATION LEVEL:22264:::1}  Mood:  Description of Group:   In this group patients will be encouraged to explore what they see as obstacles to their own wellness and recovery. They will be guided to discuss their thoughts, feelings, and behaviors related to these obstacles. The group will process together ways to cope with barriers, with attention given to specific choices patients can make. Each patient will be challenged to identify changes they are motivated to make in order to overcome their obstacles. This group will be process-oriented, with patients participating in exploration of their own experiences as well as giving and receiving support and challenge from other group members.  Therapeutic Goals: 1. Patient will identify personal and current obstacles as they relate to admission. 2. Patient will identify barriers that currently interfere with their wellness or overcoming obstacles.  3. Patient will identify feelings, thought process and behaviors related to these barriers. 4. Patient will identify two changes they are willing to make to overcome these obstacles:    Summary of Patient Progress   ***   Therapeutic Modalities:   Cognitive Behavioral Therapy Solution Focused Therapy Motivational Interviewing Relapse Prevention Therapy   Rozann Lesches, LCSW

## 2022-03-21 NOTE — Tx Team (Signed)
Initial Treatment Plan 03/21/2022 3:28 PM [name] EZB:[id number]    PATIENT STRESSORS: Health problems   Medication change or noncompliance     PATIENT STRENGTHS: Ability for insight  Active sense of humor  Average or above average intelligence  Capable of independent living    PATIENT IDENTIFIED PROBLEMS: Get my meds right ''     Mood lability                  DISCHARGE CRITERIA:  Ability to meet basic life and health needs Adequate post-discharge living arrangements Improved stabilization in mood, thinking, and/or behavior Medical problems require only outpatient monitoring  PRELIMINARY DISCHARGE PLAN: Attend PHP/IOP Outpatient therapy  PATIENT/FAMILY INVOLVEMENT: This treatment plan has been presented to and reviewed with the patient, [name] Long, and/or family member, .  The patient and family have been given the opportunity to ask questions and make suggestions.  Leonia Reader, RN 03/21/2022, 3:28 PM

## 2022-03-21 NOTE — Discharge Instructions (Signed)
Get CBC, CMP, Magnesium -  checked next visit within 2-3 days by the rounding MD   Activity: As tolerated with Full fall precautions use walker/cane & assistance as needed  Disposition psych facility  Diet: Heart Healthy  & Low Carb diet, check CBGs q. ACH S.  Special Instructions: If you have smoked or chewed Tobacco  in the last 2 yrs please stop smoking, stop any regular Alcohol  and or any Recreational drug use.  On your next visit with your primary care physician please Get Medicines reviewed and adjusted.  Please request your Prim.MD to go over all Hospital Tests and Procedure/Radiological results at the follow up, please get all Hospital records sent to your Prim MD by signing hospital release before you go home.  If you experience worsening of your admission symptoms, develop shortness of breath, life threatening emergency, suicidal or homicidal thoughts you must seek medical attention immediately by calling 911 or calling your MD immediately  if symptoms less severe.  You Must read complete instructions/literature along with all the possible adverse reactions/side effects for all the Medicines you take and that have been prescribed to you. Take any new Medicines after you have completely understood and accpet all the possible adverse reactions/side effects.
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
## 2022-03-21 NOTE — Progress Notes (Addendum)
Please call dgt Cassandra at this number when pt has arrived. [phone]   Pt discharged to [place] count police custody to transport to geri-psy. 3 bags of belongings sent with officers. Belongings from security obtained and placed in belongings bag that were taken by police. Cassandra called at updated that the patient was being transferred from [facility].
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
## 2022-03-21 NOTE — Telephone Encounter (Signed)
Answering service called. States that the patient is in the hospital and that they are trying to transfer her to [facility], an she wants our office to stop it. I spoke with Dr. Sharol Given and he wanted me to advise that she needs to get the help that she needs and that we can not intervene in care being provider by another provider, however she can follow up in the office.  I tried to call but no answer- they tried to transfer from operator. The number that was left was for the hospital [phone].

## 2022-03-21 NOTE — Progress Notes (Addendum)
12:30pm: CSW called for transportation to [facility] via [facility].  11:40am: CSW spoke with patient's son and daughter to inform them of discharge plan and they are agreeable.  10am: Patient has been accepted at [facility].   Patient will be transported via sheriff.  Secretary will fax IVC to [facility].  RN to obtain swab for COVID test and after it results, transport will be called. RN to call report to [phone].  8:45am: CSW spoke with Herschel Senegal at [facility] to request patient be reassessed for possible admission.  Madilyn Fireman, MSW, LCSW Transitions of Care  Clinical Social Worker II [phone]
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
## 2022-03-22 ENCOUNTER — Encounter: Payer: Self-pay | Admitting: Psychiatry

## 2022-03-22 LAB — GLUCOSE, CAPILLARY
Glucose-Capillary: 114 mg/dL — ABNORMAL HIGH (ref 70–99)
Glucose-Capillary: 152 mg/dL — ABNORMAL HIGH (ref 70–99)
Glucose-Capillary: 83 mg/dL (ref 70–99)
Glucose-Capillary: 184 mg/dL — ABNORMAL HIGH (ref 70–99)
Glucose-Capillary: 87 mg/dL (ref 70–99)
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
## 2022-03-22 NOTE — Progress Notes (Signed)
Patient approached this nurse stating she was ready to take her morning medications. This nurse was able to go back and give metformin, amlodipine, aspirin, eliquis, and metoprolol.

## 2022-03-22 NOTE — Progress Notes (Signed)
   03/22/22 2300  Psych Admission Type (Psych Patients Only)  Admission Status Involuntary  Psychosocial Assessment  Patient Complaints Agitation  Eye Contact Fair  Facial Expression Animated  Affect Euphoric;Irritable  Speech Pressured  Interaction Demanding  Motor Activity Slow  Appearance/Hygiene Unremarkable  Behavior Characteristics Agitated  Mood Labile  Thought Process  Coherency Flight of ideas;Disorganized  Content Delusions  Delusions Paranoid  Perception WDL  Hallucination None reported or observed  Judgment Impaired  Confusion Mild  Danger to Self  Current suicidal ideation? Denies  Danger to Others  Danger to Others None reported or observed
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
## 2022-03-22 NOTE — Plan of Care (Signed)
  Problem: Education: Goal: Knowledge of General Education information will improve Description: Including pain rating scale, medication(s)/side effects and non-pharmacologic comfort measures Outcome: Progressing   Problem: Health Behavior/Discharge Planning: Goal: Ability to manage health-related needs will improve Outcome: Progressing   Problem: Clinical Measurements: Goal: Ability to maintain clinical measurements within normal limits will improve Outcome: Progressing Goal: Will remain free from infection Outcome: Progressing Goal: Diagnostic test results will improve Outcome: Progressing Goal: Respiratory complications will improve Outcome: Progressing Goal: Cardiovascular complication will be avoided Outcome: Progressing   Problem: Pain Managment: Goal: General experience of comfort will improve Outcome: Progressing   Problem: Safety: Goal: Ability to remain free from injury will improve Outcome: Progressing   Problem: Skin Integrity: Goal: Risk for impaired skin integrity will decrease Outcome: Progressing   Problem: Education: Goal: Understanding of cardiac disease, CV risk reduction, and recovery process will improve Outcome: Progressing Goal: Individualized Educational Video(s) Outcome: Progressing   Problem: Activity: Goal: Ability to tolerate increased activity will improve Outcome: Progressing   Problem: Cardiac: Goal: Ability to achieve and maintain adequate cardiovascular perfusion will improve Outcome: Progressing   Problem: Health Behavior/Discharge Planning: Goal: Ability to safely manage health-related needs after discharge will improve Outcome: Progressing

## 2022-03-22 NOTE — B[facility] Suicide Risk Assessment (Signed)
[facility] Admission Suicide Risk Assessment   Nursing information obtained from:  Patient, Review of record Demographic factors:  Age 71 or older, Low socioeconomic status Current Mental Status:  NA Loss Factors:  Decline in physical health Historical Factors:  Prior suicide attempts, Impulsivity Risk Reduction Factors:  Living with another person, especially a relative  Total Time spent with patient: 1 hour Principal Problem: Schizoaffective disorder, bipolar type ([place]) Diagnosis:  Principal Problem:   Schizoaffective disorder, bipolar type ([place])  Subjective Data: client stopped taking her medications and became unstable, continues to be hyperverbal and tangential.  Continued Clinical Symptoms:  hypomania symptoms   The "Alcohol Use Disorders Identification Test", Guidelines for Use in Primary Care, Second Edition.  World Pharmacologist [facility]). Score between 0-7:  no or low risk or alcohol related problems. Score between 8-15:  moderate risk of alcohol related problems. Score between 16-19:  high risk of alcohol related problems. Score 20 or above:  warrants further diagnostic evaluation for alcohol dependence and treatment.   CLINICAL FACTORS:   Hypomania  Physical Exam Vitals and nursing note reviewed.  Constitutional:      Appearance: Normal appearance.  HENT:     Nose: Nose normal.  Pulmonary:     Effort: Pulmonary effort is normal.  Musculoskeletal:        General: Normal range of motion.     Cervical back: Normal range of motion.  Neurological:     General: No focal deficit present.     Mental Status: She is alert and oriented to person, place, and time.  Psychiatric:        Attention and Perception: She is inattentive.        Mood and Affect: Mood is anxious.        Cognition and Memory: Cognition and memory normal.        Judgment: Judgment normal.   Physical Exam: Physical Exam Vitals and nursing note reviewed.  Constitutional:      Appearance: Normal  appearance.  HENT:     Head: Normocephalic.     Nose: Nose normal.  Pulmonary:     Effort: Pulmonary effort is normal.  Musculoskeletal:        General: Normal range of motion.     Cervical back: Normal range of motion.  Neurological:     General: No focal deficit present.     Mental Status: She is alert and oriented to person, place, and time.  Psychiatric:        Attention and Perception: She is inattentive.        Mood and Affect: Mood is anxious and elated.        Speech: Speech is tangential.        Behavior: Behavior normal. Behavior is cooperative.        Thought Content: Thought content normal.        Cognition and Memory: Memory is impaired.  Review of Systems  Psychiatric/Behavioral:  The patient is nervous/anxious.   All other systems reviewed and are negative. Blood pressure 135/77, pulse 69, temperature 97.7 F (36.5 C), temperature source Oral, resp. rate 18, height '5\' 5"'$  (1.651 m), weight 69.9 kg, SpO2 100 %. Body mass index is 25.63 kg/m.   COGNITIVE FEATURES THAT CONTRIBUTE TO RISK:  None  SUICIDE RISK:   Minimal: No identifiable suicidal ideation.  Patients presenting with no risk factors but with morbid ruminations; may be classified as minimal risk based on the severity of the depressive symptoms  PLAN OF  CARE:  Schizoaffective disorder, bipolar type: Invega 6 mg daily Depakote 939 mg BID  I certify that inpatient services furnished can reasonably be expected to improve the patient's condition.   Waylan Boga, NP 03/22/2022, 3:28 PM
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
## 2022-03-22 NOTE — B[facility] Suicide Risk Assessment (Signed)
[facility] INPATIENT:  Family/Significant Other Suicide Prevention Education  Suicide Prevention Education:  Education Completed; [name] Long, legal guardian/daughter, [phone] has been identified by the patient as the family member/significant other with whom the patient will be residing, and identified as the person(s) who will aid the patient in the event of a mental health crisis (suicidal ideations/suicide attempt).  With written consent from the patient, the family member/significant other has been provided the following suicide prevention education, prior to the and/or following the discharge of the patient.  The suicide prevention education provided includes the following: Suicide risk factors Suicide prevention and interventions National Suicide Hotline telephone number [facility] assessment telephone number [facility] Emergency Assistance [place] and/or Residential Mobile Crisis Unit telephone number  Request made of family/significant other to: Remove weapons (e.g., guns, rifles, knives), all items previously/currently identified as safety concern.   Remove drugs/medications (over-the-counter, prescriptions, illicit drugs), all items previously/currently identified as a safety concern.  The family member/significant other verbalizes understanding of the suicide prevention education information provided.  The family member/significant other agrees to remove the items of safety concern listed above.  Daughter reports "she was agitated, combative and not taking her medications from the last time".  Daughter reports that "she was hitting at things that weren't there, just delusional".  She reports that "she was acting like she was fighting and harming the people that weren't there".  She reports no weapons in the home. She reports that patient lives on her own "but I come around the clock to make sure she is fine".  She reports that patient can return to her  home.  She reports that patient is not a danger to self but "to others at the current moment".  She reports that her mother is better when she is not "fighting the air or fussing".       [name] Long 03/22/2022, 11:38 AM

## 2022-03-22 NOTE — Progress Notes (Signed)
Patient is alert and oriented times 4. Mood and affect labile and assertive. Patient rates pain as 5/10 to lower back. She denies SI, HI, and AVH. Also denies feelings of anxiety and depression at this time. States she slept good last night. Morning meds given whole by mouth. Ate breakfast in day room- appetite good. Patient remains on unit with Q15 minute checks in place.

## 2022-03-22 NOTE — Group Note (Signed)
[facility] LCSW Group Therapy Note   Group Date: 03/22/2022 Start Time: 1330 End Time: 1430  Type of Therapy/Topic:  Group Therapy:  Feelings about Diagnosis  Participation Level:  None   Description of Group:    This group will allow patients to explore their thoughts and feelings about diagnoses they have received. Patients will be guided to explore their level of understanding and acceptance of these diagnoses. Facilitator will encourage patients to process their thoughts and feelings about the reactions of others to their diagnosis, and will guide patients in identifying ways to discuss their diagnosis with significant others in their lives. This group will be process-oriented, with patients participating in exploration of their own experiences as well as giving and receiving support and challenge from other group members.   Therapeutic Goals: 1. Patient will demonstrate understanding of diagnosis as evidence by identifying two or more symptoms of the disorder:  2. Patient will be able to express two feelings regarding the diagnosis 3. Patient will demonstrate ability to communicate their needs through discussion and/or role plays  Summary of Patient Progress: Patient arrived late to group. Patient did not engage in group discussion.  CSW observed the patient sitting with her eyes closed and making motions that she was stabbing something in the air.  Therapeutic Modalities:   Cognitive Behavioral Therapy Brief Therapy Feelings Identification    Rozann Lesches, LCSW

## 2022-03-23 ENCOUNTER — Telehealth: Payer: Self-pay | Admitting: Orthopedic Surgery

## 2022-03-23 LAB — GLUCOSE, CAPILLARY
Glucose-Capillary: 104 mg/dL — ABNORMAL HIGH (ref 70–99)
Glucose-Capillary: 113 mg/dL — ABNORMAL HIGH (ref 70–99)
Glucose-Capillary: 123 mg/dL — ABNORMAL HIGH (ref 70–99)
Glucose-Capillary: 94 mg/dL (ref 70–99)
Glucose-Capillary: 140 mg/dL — ABNORMAL HIGH (ref 70–99)
Glucose-Capillary: 91 mg/dL (ref 70–99)

## 2022-03-23 MED ORDER — IBUPROFEN 200 MG PO TABS
200.0000 mg | ORAL_TABLET | Freq: Once | ORAL | Status: AC
  Administered 2022-03-24: 200 mg via ORAL
  Filled 2022-03-23: qty 1

## 2022-03-23 MED ORDER — DIVALPROEX SODIUM 250 MG PO DR TAB
500.0000 mg | DELAYED_RELEASE_TABLET | Freq: Two times a day (BID) | ORAL | Status: DC
  Administered 2022-03-23 – 2022-04-04 (×24): 500 mg via ORAL
  Filled 2022-03-23 (×24): qty 2

## 2022-03-23 MED ORDER — IBUPROFEN 200 MG PO TABS
200.0000 mg | ORAL_TABLET | Freq: Once | ORAL | Status: AC
  Administered 2022-03-23: 200 mg via ORAL
  Filled 2022-03-23: qty 1

## 2022-03-23 MED ORDER — PALIPERIDONE ER 3 MG PO TB24
9.0000 mg | ORAL_TABLET | Freq: Every day | ORAL | Status: DC
  Administered 2022-03-23 – 2022-03-29 (×7): 9 mg via ORAL
  Filled 2022-03-23 (×7): qty 3

## 2022-03-23 NOTE — Progress Notes (Signed)
Patient complaining of pain and requesting Ibuprofen. Patient informed that previous records state she is allergic to Ibuprofen. Patient denies being allergic. Waylan Boga, NP notified. Received orders to give '200mg'$  Ibprofen one time and monitor for reaction.

## 2022-03-23 NOTE — Telephone Encounter (Signed)
Pt called again advising that she is in the hospital for mental health reasons. She does not want to be there and would like to know if Sharol Given could call her and speak with her about the neck pain she is having.   CB [phone] code [phone]

## 2022-03-23 NOTE — BH IP Treatment Plan (Signed)
Interdisciplinary Treatment and Diagnostic Plan Update  03/23/2022 Time of Session: 10:30AM [name] Long MRN: [id number]  Principal Diagnosis: Schizoaffective disorder, bipolar type ([place])  Secondary Diagnoses: Principal Problem:   Schizoaffective disorder, bipolar type ([place])   Current Medications:  Current Facility-Administered Medications  Medication Dose Route Frequency Provider Last Rate Last Admin   acetaminophen (TYLENOL) tablet 650 mg  650 mg Oral Q6H PRN Patrecia Pour, NP   650 mg at 03/23/22 0507   albuterol (VENTOLIN HFA) 108 (90 Base) MCG/ACT inhaler 2 puff  2 puff Inhalation Q6H PRN Noralee Space, RPH   2 puff at 03/23/22 0929   alum & mag hydroxide-simeth (MAALOX/MYLANTA) 200-200-20 MG/5ML suspension 30 mL  30 mL Oral Q4H PRN Patrecia Pour, NP       amLODipine (NORVASC) tablet 5 mg  5 mg Oral Daily Patrecia Pour, NP   5 mg at 03/23/22 0857   apixaban (ELIQUIS) tablet 5 mg  5 mg Oral BID Patrecia Pour, NP   5 mg at 03/23/22 7124   aspirin EC tablet 81 mg  81 mg Oral Daily Patrecia Pour, NP   81 mg at 03/23/22 0856   benzonatate (TESSALON) capsule 200 mg  200 mg Oral TID PRN Patrecia Pour, NP       bismuth subsalicylate (PEPTO BISMOL) 262 MG/15ML suspension 30 mL  30 mL Oral Q8H PRN Patrecia Pour, NP       dapagliflozin propanediol (FARXIGA) tablet 5 mg  5 mg Oral Daily Patrecia Pour, NP   5 mg at 03/23/22 0856   divalproex (DEPAKOTE) DR tablet 500 mg  500 mg Oral Q12H Patrecia Pour, NP       insulin aspart (novoLOG) injection 0-9 Units  0-9 Units Subcutaneous TID WC Patrecia Pour, NP   1 Units at 03/23/22 0750   isosorbide mononitrate (IMDUR) 24 hr tablet 15 mg  15 mg Oral Daily Patrecia Pour, NP   15 mg at 03/23/22 0856   magnesium hydroxide (MILK OF MAGNESIA) suspension 30 mL  30 mL Oral Daily PRN Patrecia Pour, NP       metFORMIN (GLUCOPHAGE) tablet 1,000 mg  1,000 mg Oral BID WC Patrecia Pour, NP   1,000 mg at 03/23/22 0750   metoprolol  succinate (TOPROL-XL) 24 hr tablet 50 mg  50 mg Oral Daily Patrecia Pour, NP   50 mg at 03/23/22 0856   nitroGLYCERIN (NITROSTAT) SL tablet 0.4 mg  0.4 mg Sublingual Q5 Min x 3 PRN Patrecia Pour, NP       Oral care mouth rinse  15 mL Mouth Rinse PRN Patrecia Pour, NP       paliperidone (INVEGA) 24 hr tablet 9 mg  9 mg Oral QHS Patrecia Pour, NP       rosuvastatin (CRESTOR) tablet 40 mg  40 mg Oral Daily Patrecia Pour, NP   40 mg at 03/23/22 0856   ziprasidone (GEODON) injection 10 mg  10 mg Intramuscular Q6H PRN Patrecia Pour, NP       PTA Medications: Medications Prior to Admission  Medication Sig Dispense Refill Last Dose   albuterol (VENTOLIN HFA) 108 (90 Base) MCG/ACT inhaler Inhale 2 puffs into the lungs 4 (four) times daily as needed for wheezing or shortness of breath. 18 g 1    amLODipine (NORVASC) 5 MG tablet Take 1 tablet (5 mg total) by mouth daily.      apixaban (  ELIQUIS) 5 MG TABS tablet Take 1 tablet (5 mg total) by mouth 2 (two) times daily. 60 tablet     aspirin EC 81 MG EC tablet Take 1 tablet (81 mg total) by mouth daily. Swallow whole. 30 tablet 11    Blood Glucose Monitoring Suppl (FREESTYLE LITE) w/Device KIT 1 each by Does not apply route daily. Dx:E11.9 1 kit 0    Blood Pressure Monitoring (BLOOD PRESSURE MONITOR/WRIST) KIT 1 Units by Does not apply route daily at 6 (six) AM. 1 kit 0    divalproex (DEPAKOTE SPRINKLE) 125 MG capsule Take 2 capsules (250 mg total) by mouth every 12 (twelve) hours.      FARXIGA 5 MG TABS tablet Take 5 mg by mouth daily.      fluticasone (FLONASE) 50 MCG/ACT nasal spray Place 2 sprays into both nostrils daily. 16 g 0    insulin aspart (NOVOLOG) 100 UNIT/ML FlexPen Before each meal 3 times a day, 140-199 - 2 units, 200-250 - 4 units, 251-299 - 6 units,  300-349 - 8 units,  350 or above 10 units. Insulin PEN if approved, provide syringes and needles if needed.Please switch to any approved short acting Insulin if needed. 15 mL 0     isosorbide mononitrate (IMDUR) 30 MG 24 hr tablet Take 0.5 tablets (15 mg total) by mouth daily.      Lancets (FREESTYLE) lancets Use to check blood sugar once daily. Dx: E11.9 100 each 5    metFORMIN (GLUCOPHAGE) 1000 MG tablet Take 1 tablet (1,000 mg total) by mouth 2 (two) times daily with a meal. 60 tablet 2    metoprolol succinate (TOPROL-XL) 50 MG 24 hr tablet Take 1 tablet (50 mg total) by mouth daily. 30 tablet 11    nitroGLYCERIN (NITROSTAT) 0.4 MG SL tablet Place under the tongue as needed.      paliperidone (INVEGA) 6 MG 24 hr tablet Take 1 tablet (6 mg total) by mouth at bedtime.      pantoprazole (PROTONIX) 40 MG tablet Take 1 tablet (40 mg total) by mouth daily. 30 tablet 2    rosuvastatin (CRESTOR) 40 MG tablet Take 1 tablet (40 mg total) by mouth daily. 30 tablet 2     Patient Stressors: Health problems   Medication change or noncompliance    Patient Strengths: Ability for insight  Active sense of humor  Average or above average intelligence  Capable of independent living   Treatment Modalities: Medication Management, Group therapy, Case management,  1 to 1 session with clinician, Psychoeducation, Recreational therapy.   Physician Treatment Plan for Primary Diagnosis: Schizoaffective disorder, bipolar type ([place]) Long Term Goal(s): Improvement in symptoms so as ready for discharge   Short Term Goals: Ability to identify changes in lifestyle to reduce recurrence of condition will improve Ability to verbalize feelings will improve Ability to disclose and discuss suicidal ideas Ability to demonstrate self-control will improve Ability to identify and develop effective coping behaviors will improve Ability to maintain clinical measurements within normal limits will improve Compliance with prescribed medications will improve Ability to identify triggers associated with substance abuse/mental health issues will improve  Medication Management: Evaluate patient's response,  side effects, and tolerance of medication regimen.  Therapeutic Interventions: 1 to 1 sessions, Unit Group sessions and Medication administration.  Evaluation of Outcomes: Not Met  Physician Treatment Plan for Secondary Diagnosis: Principal Problem:   Schizoaffective disorder, bipolar type ([place])  Long Term Goal(s): Improvement in symptoms so as ready for discharge   Short  Term Goals: Ability to identify changes in lifestyle to reduce recurrence of condition will improve Ability to verbalize feelings will improve Ability to disclose and discuss suicidal ideas Ability to demonstrate self-control will improve Ability to identify and develop effective coping behaviors will improve Ability to maintain clinical measurements within normal limits will improve Compliance with prescribed medications will improve Ability to identify triggers associated with substance abuse/mental health issues will improve     Medication Management: Evaluate patient's response, side effects, and tolerance of medication regimen.  Therapeutic Interventions: 1 to 1 sessions, Unit Group sessions and Medication administration.  Evaluation of Outcomes: Not Met   RN Treatment Plan for Primary Diagnosis: Schizoaffective disorder, bipolar type ([place]) Long Term Goal(s): Knowledge of disease and therapeutic regimen to maintain health will improve  Short Term Goals: Ability to demonstrate self-control, Ability to participate in decision making will improve, Ability to verbalize feelings will improve, Ability to identify and develop effective coping behaviors will improve, and Compliance with prescribed medications will improve  Medication Management: RN will administer medications as ordered by provider, will assess and evaluate patient's response and provide education to patient for prescribed medication. RN will report any adverse and/or side effects to prescribing provider.  Therapeutic Interventions: 1 on 1 counseling  sessions, Psychoeducation, Medication administration, Evaluate responses to treatment, Monitor vital signs and CBGs as ordered, Perform/monitor CIWA, COWS, AIMS and Fall Risk screenings as ordered, Perform wound care treatments as ordered.  Evaluation of Outcomes: Not Met   LCSW Treatment Plan for Primary Diagnosis: Schizoaffective disorder, bipolar type ([place]) Long Term Goal(s): Safe transition to appropriate next level of care at discharge, Engage patient in therapeutic group addressing interpersonal concerns.  Short Term Goals: Engage patient in aftercare planning with referrals and resources, Increase social support, Increase ability to appropriately verbalize feelings, Increase emotional regulation, Facilitate acceptance of mental health diagnosis and concerns, and Increase skills for wellness and recovery  Therapeutic Interventions: Assess for all discharge needs, 1 to 1 time with Social worker, Explore available resources and support systems, Assess for adequacy in community support network, Educate family and significant other(s) on suicide prevention, Complete Psychosocial Assessment, Interpersonal group therapy.  Evaluation of Outcomes: Not Met   Progress in Treatment: Attending groups: Yes. Participating in groups: Yes. Taking medication as prescribed: Yes. Toleration medication: Yes. Family/Significant other contact made: Yes, individual(s) contacted:  SPE completed with the patient's legal guardian/daughter Patient understands diagnosis: Yes. Discussing patient identified problems/goals with staff: Yes. Medical problems stabilized or resolved: Yes. Denies suicidal/homicidal ideation: Yes. Issues/concerns per patient self-inventory: No. Other: none  New problem(s) identified: No, Describe:  none  New Short Term/Long Term Goal(s): elimination of symptoms of psychosis, medication management for mood stabilization; elimination of SI thoughts; development of comprehensive mental  wellness plan.   Patient Goals:  "get an x-ray and a CAT scan"  Discharge Plan or Barriers: Patient reports plans to return to her home.  Patient reports that she is willing to begin ACTT services with [facility] at discharge.  CSW to follow up with guardian and continue to assist in development of appropriate discharge plans.   Reason for Continuation of Hospitalization: Anxiety Delusions  Depression Hallucinations Medication stabilization  Estimated Length of Stay:  1-7 days  Last 3 [place] Suicide Severity Risk Score: Flowsheet Row Admission (Current) from 03/21/2022 in [place] ED to Hosp-Admission (Discharged) from 03/07/2022 in [place] [facility] ED from 12/08/2021 in [facility] [place] No  Risk No Risk No Risk       Last PHQ 2/9 Scores:    03/27/2020    2:37 PM 03/23/2020    2:02 PM 03/12/2020    2:36 PM  Depression screen PHQ 2/9  Decreased Interest 0 0 3  Down, Depressed, Hopeless 0 0 0  PHQ - 2 Score 0 0 3  Altered sleeping 0 3 1  Tired, decreased energy 0 0 0  Change in appetite 0 0 0  Feeling bad or failure about yourself  0 0 0  Trouble concentrating 0 3 1  Moving slowly or fidgety/restless 0 0 0  Suicidal thoughts 0 0 0  PHQ-9 Score 0 6 5    Scribe for Treatment Team: Rozann Lesches, LCSW 03/23/2022 10:41 AM

## 2022-03-23 NOTE — Progress Notes (Signed)
Patient denies SI, HI, AVH. Patient's chief complaint is of pain in her neck, collarbone, and shoulder. Patient is disorganized and tangential. She is compliant with scheduled medications. Patient remains safe on the unit at this time.

## 2022-03-23 NOTE — Progress Notes (Signed)
[facility] MD Progress Note  03/23/2022 9:50 AM [name] Long  MRN:  [id number]  Subjective:  Pt is a 71 yo female her with principal problem of Schitzoaffective disorder, bipolar type. She is pleasant during the interview, but appears tangential and mildly paranoid.   When asked how she is, she immediately repots having "visions" from a young age and connects this to religious gifts in a mildly incoherent way. She also appears delusional, stating she has a multitude of degrees (social work, Building surveyor, pHD, Designer, jewellery, Masters, etc.). She also appears paranoid, stating employees are "planting" themselves and she recognizes them from her  care previously. She also voices concerns about friends making "crack" in her house without her permission. She also feels people are watching her to the point she states she washes with clothes on.   She verbally denies any depression, anxiety, SI, HI, hallucinations. She reports good appetite. However, in group yesterday she was stabbing at the air, experiencing visual hallucinations.  She reports continued pain throughout most of her body which began on July 4th when blinds fell on her and was exacerbated when encountering the police earlier. She reports gasping during this time as well.  She is concerned about missing a cardiac appointment in [place] she states was scheduled for today and she states she was supposed to have a "cath."   Principal Problem: Schizoaffective disorder, bipolar type ([place]) Diagnosis: Principal Problem:   Schizoaffective disorder, bipolar type ([place])  Total Time spent with patient: 30 minutes  Past Psychiatric History: schizoaffective disorder, bipolar type  Past Medical History:  Past Medical History:  Diagnosis Date   Abscessed tooth 02/09/2020   Acute on chronic congestive heart failure ([place])    Adenomatous colon polyp    Arthritis    "real bad; all over" (07/12/2017)   Asthma    Atrial fibrillation ([place])    BIPOLAR DISORDER  08/31/2006   Qualifier: Diagnosis of  By: Dorathy Daft MD, Marjory Lies     CHRONIC KIDNEY DISEASE STAGE II (MILD) 09/14/2009   Annotation: eGFR 66 Qualifier: Diagnosis of  By: Jess Barters MD, Erik     Chronic lower back pain    Congestive heart failure (CHF) ([place])    COPD (chronic obstructive pulmonary disease) ([place]) 09/23/2010   Diagnosed at [facility] in 2008 (Dr. Annamaria Boots)    DM (diabetes mellitus) type II controlled with renal manifestation ([place]) 08/31/2006   Qualifier: Diagnosis of  By: Dorathy Daft MD, Marjory Lies     Dyspnea    "all my life; since 6th grade" (07/12/2017)   Fatty liver    HYPERCHOLESTEROLEMIA 08/31/2006   Intolerance to Lipitor OK on Crestor but medicaid no longer covering     HYPERTENSION, BENIGN SYSTEMIC 08/31/2006   Qualifier: Diagnosis of  By: Dorathy Daft MD, Aaron     Hypokalemia    HYPOTHYROIDISM, UNSPECIFIED 08/31/2006   Qualifier: Diagnosis of  By: Dorathy Daft MD, Marjory Lies     Leg swelling 03/14/2018   Pneumonia    "3 times" (07/12/2017)   Pulmonary nodule    Renal cyst    Schizophrenia ([place])    Scoliosis    Stomach problems    Thyroid disorder     Past Surgical History:  Procedure Laterality Date   CESAREAN SECTION     FOOT FRACTURE SURGERY Right    "steel plate in it"   FOOT SURGERY     " born w/dislocated foot"   FRACTURE SURGERY     KNEE ARTHROSCOPY Right    TOE SURGERY Bilateral    "  both pinky toes"   TONSILLECTOMY AND ADENOIDECTOMY     Family History:  Family History  Problem Relation Age of Onset   Heart disease Mother 65   Rectal cancer Mother    Diabetes Mother    High Cholesterol Mother    Hypertension Mother    Stroke Mother    Diabetes Father 24   Hypertension Father    Breast cancer Sister    High Cholesterol Sister    Hypertension Sister    Breast cancer Sister    Heart disease Brother    High Cholesterol Brother    Hypertension Brother    Colon cancer Maternal Grandmother    Asthma Daughter    Asthma Daughter    Asthma Son    Stomach cancer Neg Hx     Esophageal cancer Neg Hx    Pancreatic cancer Neg Hx    Family Psychiatric  History: see above Social History:  Social History   Substance and Sexual Activity  Alcohol Use Not Currently   Comment: 07/12/2017 "stopped 2 yr ago; did have a drink over the holidays recently"     Social History   Substance and Sexual Activity  Drug Use No    Social History   Socioeconomic History   Marital status: Divorced    Spouse name: Not on file   Number of children: 4   Years of education: 12   Highest education level: High school graduate  Occupational History   Not on file  Tobacco Use   Smoking status: Every Day    Packs/day: 0.25    Years: 45.00    Total pack years: 11.25    Types: Cigarettes   Smokeless tobacco: Never   Tobacco comments:    patient states she smokes 1 cigarette per month  Vaping Use   Vaping Use: Never used  Substance and Sexual Activity   Alcohol use: Not Currently    Comment: 07/12/2017 "stopped 2 yr ago; did have a drink over the holidays recently"   Drug use: No   Sexual activity: Not Currently  Other Topics Concern   Not on file  Social History Narrative   Patient lives alone in [place].    Patient does not drive, she uses insurance transportation or her children.   Patient enjoys twirling her paton, listening to music, dancing, and spending time with family.    Patient enjoys celebrating all holidays and birthdays of loved ones.   Social Determinants of Health   Financial Resource Strain: Low Risk  (07/18/2019)   Overall Financial Resource Strain (CARDIA)    Difficulty of Paying Living Expenses: Not very hard  Food Insecurity: No Food Insecurity (03/21/2022)   Hunger Vital Sign    Worried About Running Out of Food in the Last Year: Never true    Ran Out of Food in the Last Year: Never true  Transportation Needs: No Transportation Needs (03/21/2022)   PRAPARE - Hydrologist (Medical): No    Lack of Transportation  (Non-Medical): No  Physical Activity: Inactive (07/18/2019)   Exercise Vital Sign    Days of Exercise per Week: 0 days    Minutes of Exercise per Session: 0 min  Stress: No Stress Concern Present (07/18/2019)   [place]    Feeling of Stress : Only a little  Social Connections: Moderately Integrated (07/18/2019)   Social Connection and Isolation Panel [NHANES]    Frequency of Communication with Friends  and Family: More than three times a week    Frequency of Social Gatherings with Friends and Family: More than three times a week    Attends Religious Services: More than 4 times per year    Active Member of [employer] or Organizations: Yes    Attends Archivist Meetings: 1 to 4 times per year    Marital Status: Divorced   Additional Social History: lives with family    Sleep: appears well rested   Appetite:  Good  Current Medications: Current Facility-Administered Medications  Medication Dose Route Frequency Provider Last Rate Last Admin   acetaminophen (TYLENOL) tablet 650 mg  650 mg Oral Q6H PRN Patrecia Pour, NP   650 mg at 03/23/22 0507   albuterol (VENTOLIN HFA) 108 (90 Base) MCG/ACT inhaler 2 puff  2 puff Inhalation Q6H PRN Noralee Space, RPH   2 puff at 03/23/22 0929   alum & mag hydroxide-simeth (MAALOX/MYLANTA) 200-200-20 MG/5ML suspension 30 mL  30 mL Oral Q4H PRN Patrecia Pour, NP       amLODipine (NORVASC) tablet 5 mg  5 mg Oral Daily Patrecia Pour, NP   5 mg at 03/23/22 0857   apixaban (ELIQUIS) tablet 5 mg  5 mg Oral BID Patrecia Pour, NP   5 mg at 03/23/22 1610   aspirin EC tablet 81 mg  81 mg Oral Daily Patrecia Pour, NP   81 mg at 03/23/22 0856   benzonatate (TESSALON) capsule 200 mg  200 mg Oral TID PRN Patrecia Pour, NP       bismuth subsalicylate (PEPTO BISMOL) 262 MG/15ML suspension 30 mL  30 mL Oral Q8H PRN Patrecia Pour, NP       dapagliflozin propanediol (FARXIGA) tablet 5  mg  5 mg Oral Daily Patrecia Pour, NP   5 mg at 03/23/22 0856   divalproex (DEPAKOTE SPRINKLE) capsule 250 mg  250 mg Oral Q12H Patrecia Pour, NP   250 mg at 03/23/22 0856   insulin aspart (novoLOG) injection 0-9 Units  0-9 Units Subcutaneous TID WC Patrecia Pour, NP   1 Units at 03/23/22 0750   isosorbide mononitrate (IMDUR) 24 hr tablet 15 mg  15 mg Oral Daily Patrecia Pour, NP   15 mg at 03/23/22 0856   magnesium hydroxide (MILK OF MAGNESIA) suspension 30 mL  30 mL Oral Daily PRN Patrecia Pour, NP       metFORMIN (GLUCOPHAGE) tablet 1,000 mg  1,000 mg Oral BID WC Patrecia Pour, NP   1,000 mg at 03/23/22 0750   metoprolol succinate (TOPROL-XL) 24 hr tablet 50 mg  50 mg Oral Daily Patrecia Pour, NP   50 mg at 03/23/22 0856   nitroGLYCERIN (NITROSTAT) SL tablet 0.4 mg  0.4 mg Sublingual Q5 Min x 3 PRN Patrecia Pour, NP       Oral care mouth rinse  15 mL Mouth Rinse PRN Patrecia Pour, NP       paliperidone (INVEGA) 24 hr tablet 6 mg  6 mg Oral QHS Patrecia Pour, NP   6 mg at 03/22/22 2025   rosuvastatin (CRESTOR) tablet 40 mg  40 mg Oral Daily Patrecia Pour, NP   40 mg at 03/23/22 0856   ziprasidone (GEODON) injection 10 mg  10 mg Intramuscular Q6H PRN Patrecia Pour, NP        Lab Results:  Results for orders placed or performed during the hospital  encounter of 03/21/22 (from the past 48 hour(s))  Glucose, capillary     Status: Abnormal   Collection Time: 03/21/22  8:38 PM  Result Value Ref Range   Glucose-Capillary 106 (H) 70 - 99 mg/dL    Comment: Glucose reference range applies only to samples taken after fasting for at least 8 hours.   Comment 1 Notify RN   Glucose, capillary     Status: Abnormal   Collection Time: 03/22/22  7:57 AM  Result Value Ref Range   Glucose-Capillary 114 (H) 70 - 99 mg/dL    Comment: Glucose reference range applies only to samples taken after fasting for at least 8 hours.  Glucose, capillary     Status: Abnormal   Collection Time:  03/22/22 11:35 AM  Result Value Ref Range   Glucose-Capillary 152 (H) 70 - 99 mg/dL    Comment: Glucose reference range applies only to samples taken after fasting for at least 8 hours.  Glucose, capillary     Status: None   Collection Time: 03/22/22  2:43 PM  Result Value Ref Range   Glucose-Capillary 83 70 - 99 mg/dL    Comment: Glucose reference range applies only to samples taken after fasting for at least 8 hours.  Glucose, capillary     Status: Abnormal   Collection Time: 03/22/22  4:28 PM  Result Value Ref Range   Glucose-Capillary 184 (H) 70 - 99 mg/dL    Comment: Glucose reference range applies only to samples taken after fasting for at least 8 hours.  Glucose, capillary     Status: None   Collection Time: 03/22/22  8:32 PM  Result Value Ref Range   Glucose-Capillary 87 70 - 99 mg/dL    Comment: Glucose reference range applies only to samples taken after fasting for at least 8 hours.  Glucose, capillary     Status: Abnormal   Collection Time: 03/23/22  7:28 AM  Result Value Ref Range   Glucose-Capillary 123 (H) 70 - 99 mg/dL    Comment: Glucose reference range applies only to samples taken after fasting for at least 8 hours.   *Note: Due to a large number of results and/or encounters for the requested time period, some results have not been displayed. A complete set of results can be found in Results Review.    Blood Alcohol level:  Lab Results  Component Value Date   ETH <10 03/07/2022   ETH <10 49/67/5916    Metabolic Disorder Labs: Lab Results  Component Value Date   HGBA1C 9.7 (H) 03/07/2022   MPG 231.69 03/07/2022   MPG 192 04/19/2021   No results found for: "PROLACTIN" Lab Results  Component Value Date   CHOL 215 (H) 03/08/2022   TRIG 139 03/08/2022   HDL 43 03/08/2022   CHOLHDL 5.0 03/08/2022   VLDL 28 03/08/2022   LDLCALC 144 (H) 03/08/2022   LDLCALC 80 04/19/2021    Physical Findings: AIMS:  , ,  ,  ,    CIWA:    COWS:      Musculoskeletal: Gait & Station: uses walker    Psychiatric Specialty Exam: Physical Exam Vitals and nursing note reviewed.  Constitutional:      Appearance: Normal appearance.  HENT:     Head: Normocephalic.     Nose: Nose normal.  Pulmonary:     Effort: Pulmonary effort is normal.  Musculoskeletal:        General: Normal range of motion.     Cervical back: Normal  range of motion.  Neurological:     General: No focal deficit present.     Mental Status: She is alert and oriented to person, place, and time.  Psychiatric:        Attention and Perception: She is inattentive. She perceives visual hallucinations.        Mood and Affect: Mood is anxious.        Speech: Speech is tangential.        Behavior: Behavior is cooperative.        Thought Content: Thought content is delusional.        Cognition and Memory: Cognition and memory normal.        Judgment: Judgment normal.     Review of Systems  Psychiatric/Behavioral:  Positive for hallucinations. The patient is nervous/anxious.   All other systems reviewed and are negative.   Blood pressure 125/67, pulse 72, temperature 98 F (36.7 C), temperature source Oral, resp. rate 18, height _0  (1.651 m), weight 69.9 kg, SpO2 100 %.Body mass index is 25.63 kg/m.  General Appearance: Casual  Eye Contact:  Good  Speech:   tangental   Volume:  Normal  Mood:  Euthymic  Affect:  Appropriate  Thought Process:  Tangential  Orientation:  appears Full (Time, Place, and Person)  Thought Content:  Delusions, Hallucinations: Visual, Paranoid Ideation, and Tangential  Suicidal Thoughts:  No  Homicidal Thoughts:  No  Memory:  Recent;   Good  Judgement:  Fair  Insight:  Fair  Psychomotor Activity:  Normal  Concentration:  Concentration: Fair and Attention Span: Fair  Recall:  NA  Fund of Knowledge:  Fair  Language:  Good  Akathisia:  No  Handed:  Left  AIMS (if indicated):     Assets:  Resilience  ADL's:  Intact  Cognition:   WNL  Sleep:         Physical Exam: Physical Exam Vitals and nursing note reviewed.  Constitutional:      Appearance: Normal appearance.  HENT:     Head: Normocephalic.     Nose: Nose normal.  Pulmonary:     Effort: Pulmonary effort is normal.  Musculoskeletal:        General: Normal range of motion.     Cervical back: Normal range of motion.  Neurological:     General: No focal deficit present.     Mental Status: She is alert and oriented to person, place, and time.  Psychiatric:        Attention and Perception: She is inattentive. She perceives visual hallucinations.        Mood and Affect: Mood is anxious.        Speech: Speech is tangential.        Behavior: Behavior is cooperative.        Thought Content: Thought content is delusional.        Cognition and Memory: Cognition and memory normal.        Judgment: Judgment normal.    Review of Systems  Psychiatric/Behavioral:  Positive for hallucinations. The patient is nervous/anxious.   All other systems reviewed and are negative.  Blood pressure 125/67, pulse 72, temperature 98 F (36.7 C), temperature source Oral, resp. rate 18, height _1  (1.651 m), weight 69.9 kg, SpO2 100 %. Body mass index is 25.63 kg/m.  Treatment Plan Summary: Daily contact with patient to assess and evaluate symptoms and progress in treatment, Medication management, and Plan : Schizoaffective disorder, bipolar type: Increased Depakote 250 mg  BID to 500 mg BID Increased Invega 6 mg to 9 mg daily  Waylan Boga, NP 03/23/2022, 9:50 AM

## 2022-03-23 NOTE — Group Note (Signed)
[facility] LCSW Group Therapy Note   Group Date: 03/23/2022 Start Time: 1330 End Time: 1430   Type of Therapy/Topic:  Group Therapy:  Emotion Regulation  Participation Level:  Minimal   Mood:  Description of Group:    The purpose of this group is to assist patients in learning to regulate negative emotions and experience positive emotions. Patients will be guided to discuss ways in which they have been vulnerable to their negative emotions. These vulnerabilities will be juxtaposed with experiences of positive emotions or situations, and patients challenged to use positive emotions to combat negative ones. Special emphasis will be placed on coping with negative emotions in conflict situations, and patients will process healthy conflict resolution skills.  Therapeutic Goals: Patient will identify two positive emotions or experiences to reflect on in order to balance out negative emotions:  Patient will label two or more emotions that they find the most difficult to experience:  Patient will be able to demonstrate positive conflict resolution skills through discussion or role plays:   Summary of Patient Progress: Patient was present in group. Patient arrived to group late.  Once arrived to group, patient would often become distracted and ask questions that were unrelated to the discussion topic.  Though the questions were not related to the topic, questions were therapeutic in nature.  Therapeutic Modalities:   Cognitive Behavioral Therapy Feelings Identification Dialectical Behavioral Therapy   Rozann Lesches, LCSW

## 2022-03-23 NOTE — BH Assessment (Signed)
Received patient sitting in the day room alert and oriented x 4. She is walking up to the nurse's station greeting everyone. No confusion noted at this time. Will continue to monitor patient for safety.  2030 Patient is currently denying SI/HI, A/V hallucinations. Remains alert and oriented and appropriately responding to interaction form fellow patients.   2114 Patient was given Tylenol 650 mg for upper and lower, right shoulder, and a headache all with a pain level of an 8.  2150 Patient remains medication compliant and is knowledgeable of most of her medication. She is using her walker consistently safe.   0020 Patient received Advil 200 mg  PO for right shoulder and generalize body pain with a pain level of 8. Will continue to monitor patient for safety and pain relief.  0545 Patient at nurses desk requesting hair personal grooming solution. She is alert and oriented x4 and she is pleasant.  0600 Patient is complaining of back and left knee pain; requested Tylenol and received Tylenol 650 mg PO for pain level of 6.  [phone] Patient is confused and expressing persecutory accusations towards family members and a non family. She mention some high powered person coming in to [place] to scan her with the help of her family. She warned to be care that helping her could bring home to this Probation officer. Will continue to monitor patient for safety.

## 2022-03-23 NOTE — Telephone Encounter (Signed)
Pt has ben IVC'd and will be happy to see her once she has d/c from the hospital per Dr. Sharol Given.

## 2022-03-24 LAB — GLUCOSE, CAPILLARY
Glucose-Capillary: 107 mg/dL — ABNORMAL HIGH (ref 70–99)
Glucose-Capillary: 90 mg/dL (ref 70–99)
Glucose-Capillary: 86 mg/dL (ref 70–99)
Glucose-Capillary: 168 mg/dL — ABNORMAL HIGH (ref 70–99)
Glucose-Capillary: 114 mg/dL — ABNORMAL HIGH (ref 70–99)

## 2022-03-24 NOTE — Group Note (Signed)
[facility] LCSW Group Therapy Note   Group Date: 03/24/2022 Start Time: 1310 End Time: 1410   Type of Therapy/Topic:  Group Therapy:  Balance in Life  Participation Level:  Minimal   Description of Group:    This group will address the concept of balance and how it feels and looks when one is unbalanced. Patients will be encouraged to process areas in their lives that are out of balance, and identify reasons for remaining unbalanced. Facilitators will guide patients utilizing problem- solving interventions to address and correct the stressor making their life unbalanced. Understanding and applying boundaries will be explored and addressed for obtaining  and maintaining a balanced life. Patients will be encouraged to explore ways to assertively make their unbalanced needs known to significant others in their lives, using other group members and facilitator for support and feedback.  Therapeutic Goals: Patient will identify two or more emotions or situations they have that consume much of in their lives. Patient will identify signs/triggers that life has become out of balance:  Patient will identify two ways to set boundaries in order to achieve balance in their lives:  Patient will demonstrate ability to communicate their needs through discussion and/or role plays  Summary of Patient Progress: Patient arrived to group later.  Patient was off topic for majority of group.  However, patient was able to establish loose connections with the subject matter at hand.  Patient entered group thinking of balance as literal. She reports that something that can know her off balance is "a blow upside the head".  She provided an example of when something fell off the washer and dryer onto her.  Patient reports that a time that she successfully regained balance "when I fell in love again".  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  CSW  notes that patient wanted it to be understood that she "is not off balance".    Therapeutic Modalities:   Cognitive Behavioral Therapy Solution-Focused Therapy Assertiveness Training   Rozann Lesches, LCSW

## 2022-03-24 NOTE — Progress Notes (Signed)
Patient is A+O x 3. She denies SI/HI/AVH. She denies depression and anxiety. Patient is very preoccupied with wanting staff to speak with her daughter stating, "someone is using my Medicare and Medicaid." Patient is disorganized at times. She received PRN Tylenol for 10/10 generalized pain and Pepto Bismol for indigestion. Q15 unit checks in place.

## 2022-03-24 NOTE — Progress Notes (Addendum)
[facility] MD Progress Note  03/24/2022 9:02 AM SHERILYN WINDHORST  MRN:  [id number]  Subjective:  Pt is a 71 yo female her with principal problem of Schitzoaffective disorder, bipolar type. She is pleasant during the interview and more focused today, not tangential.  Prior to assessment, she was relaxing in bed after breakfast.  Reports she slept "very well" and appetite is "good".  Not responding to internal stimuli but was having a conversation in her room yesterday prior to group with unseen people.  Today, she states the techs and nurses were very kind to her and put warm packs on her shoulder and back which she appreciated.  No depression, anxious about wanting to discharge.  Denies side effects from her medications and appearing to returning to her baseline.  Principal Problem: Schizoaffective disorder, bipolar type ([place]) Diagnosis: Principal Problem:   Schizoaffective disorder, bipolar type ([place])  Total Time spent with patient: 30 minutes  Past Psychiatric History: schizoaffective disorder, bipolar type  Past Medical History:  Past Medical History:  Diagnosis Date   Abscessed tooth 02/09/2020   Acute on chronic congestive heart failure ([place])    Adenomatous colon polyp    Arthritis    "real bad; all over" (07/12/2017)   Asthma    Atrial fibrillation ([place])    BIPOLAR DISORDER 08/31/2006   Qualifier: Diagnosis of  By: Dorathy Daft MD, Marjory Lies     CHRONIC KIDNEY DISEASE STAGE II (MILD) 09/14/2009   Annotation: eGFR 21 Qualifier: Diagnosis of  By: Jess Barters MD, Erik     Chronic lower back pain    Congestive heart failure (CHF) (HCC)    COPD (chronic obstructive pulmonary disease) ([place]) 09/23/2010   Diagnosed at [facility] in 2008 (Dr. Annamaria Boots)    DM (diabetes mellitus) type II controlled with renal manifestation ([place]) 08/31/2006   Qualifier: Diagnosis of  By: Dorathy Daft MD, Marjory Lies     Dyspnea    "all my life; since 6th grade" (07/12/2017)   Fatty liver    HYPERCHOLESTEROLEMIA 08/31/2006   Intolerance to  Lipitor OK on Crestor but medicaid no longer covering     HYPERTENSION, BENIGN SYSTEMIC 08/31/2006   Qualifier: Diagnosis of  By: Dorathy Daft MD, Aaron     Hypokalemia    HYPOTHYROIDISM, UNSPECIFIED 08/31/2006   Qualifier: Diagnosis of  By: Dorathy Daft MD, Marjory Lies     Leg swelling 03/14/2018   Pneumonia    "3 times" (07/12/2017)   Pulmonary nodule    Renal cyst    Schizophrenia ([place])    Scoliosis    Stomach problems    Thyroid disorder     Past Surgical History:  Procedure Laterality Date   CESAREAN SECTION     FOOT FRACTURE SURGERY Right    "steel plate in it"   FOOT SURGERY     " born w/dislocated foot"   FRACTURE SURGERY     KNEE ARTHROSCOPY Right    TOE SURGERY Bilateral    "both pinky toes"   TONSILLECTOMY AND ADENOIDECTOMY     Family History:  Family History  Problem Relation Age of Onset   Heart disease Mother 3   Rectal cancer Mother    Diabetes Mother    High Cholesterol Mother    Hypertension Mother    Stroke Mother    Diabetes Father 25   Hypertension Father    Breast cancer Sister    High Cholesterol Sister    Hypertension Sister    Breast cancer Sister    Heart disease Brother  High Cholesterol Brother    Hypertension Brother    Colon cancer Maternal Grandmother    Asthma Daughter    Asthma Daughter    Asthma Son    Stomach cancer Neg Hx    Esophageal cancer Neg Hx    Pancreatic cancer Neg Hx    Family Psychiatric  History: see above Social History:  Social History   Substance and Sexual Activity  Alcohol Use Not Currently   Comment: 07/12/2017 "stopped 2 yr ago; did have a drink over the holidays recently"     Social History   Substance and Sexual Activity  Drug Use No    Social History   Socioeconomic History   Marital status: Divorced    Spouse name: Not on file   Number of children: 4   Years of education: 12   Highest education level: High school graduate  Occupational History   Not on file  Tobacco Use   Smoking status: Every Day     Packs/day: 0.25    Years: 45.00    Total pack years: 11.25    Types: Cigarettes   Smokeless tobacco: Never   Tobacco comments:    patient states she smokes 1 cigarette per month  Vaping Use   Vaping Use: Never used  Substance and Sexual Activity   Alcohol use: Not Currently    Comment: 07/12/2017 "stopped 2 yr ago; did have a drink over the holidays recently"   Drug use: No   Sexual activity: Not Currently  Other Topics Concern   Not on file  Social History Narrative   Patient lives alone in [place].    Patient does not drive, she uses insurance transportation or her children.   Patient enjoys twirling her paton, listening to music, dancing, and spending time with family.    Patient enjoys celebrating all holidays and birthdays of loved ones.   Social Determinants of Health   Financial Resource Strain: Low Risk  (07/18/2019)   Overall Financial Resource Strain (CARDIA)    Difficulty of Paying Living Expenses: Not very hard  Food Insecurity: No Food Insecurity (03/21/2022)   Hunger Vital Sign    Worried About Running Out of Food in the Last Year: Never true    Ran Out of Food in the Last Year: Never true  Transportation Needs: No Transportation Needs (03/21/2022)   PRAPARE - Hydrologist (Medical): No    Lack of Transportation (Non-Medical): No  Physical Activity: Inactive (07/18/2019)   Exercise Vital Sign    Days of Exercise per Week: 0 days    Minutes of Exercise per Session: 0 min  Stress: No Stress Concern Present (07/18/2019)   [place]    Feeling of Stress : Only a little  Social Connections: Moderately Integrated (07/18/2019)   Social Connection and Isolation Panel [NHANES]    Frequency of Communication with Friends and Family: More than three times a week    Frequency of Social Gatherings with Friends and Family: More than three times a week    Attends Religious  Services: More than 4 times per year    Active Member of [employer] or Organizations: Yes    Attends Archivist Meetings: 1 to 4 times per year    Marital Status: Divorced   Additional Social History: lives with family    Sleep: appears well rested   Appetite:  Good  Current Medications: Current Facility-Administered Medications  Medication Dose Route  Frequency Provider Last Rate Last Admin   acetaminophen (TYLENOL) tablet 650 mg  650 mg Oral Q6H PRN Patrecia Pour, NP   650 mg at 03/24/22 0611   albuterol (VENTOLIN HFA) 108 (90 Base) MCG/ACT inhaler 2 puff  2 puff Inhalation Q6H PRN Noralee Space, RPH   2 puff at 03/24/22 0612   alum & mag hydroxide-simeth (MAALOX/MYLANTA) 200-200-20 MG/5ML suspension 30 mL  30 mL Oral Q4H PRN Patrecia Pour, NP       amLODipine (NORVASC) tablet 5 mg  5 mg Oral Daily Patrecia Pour, NP   5 mg at 03/23/22 0857   apixaban (ELIQUIS) tablet 5 mg  5 mg Oral BID Patrecia Pour, NP   5 mg at 03/23/22 2115   aspirin EC tablet 81 mg  81 mg Oral Daily Patrecia Pour, NP   81 mg at 03/23/22 8850   benzonatate (TESSALON) capsule 200 mg  200 mg Oral TID PRN Patrecia Pour, NP       bismuth subsalicylate (PEPTO BISMOL) 262 MG/15ML suspension 30 mL  30 mL Oral Q8H PRN Patrecia Pour, NP       dapagliflozin propanediol (FARXIGA) tablet 5 mg  5 mg Oral Daily Patrecia Pour, NP   5 mg at 03/23/22 0856   divalproex (DEPAKOTE) DR tablet 500 mg  500 mg Oral Q12H Patrecia Pour, NP   500 mg at 03/23/22 2115   insulin aspart (novoLOG) injection 0-9 Units  0-9 Units Subcutaneous TID WC Patrecia Pour, NP   1 Units at 03/23/22 1634   isosorbide mononitrate (IMDUR) 24 hr tablet 15 mg  15 mg Oral Daily Patrecia Pour, NP   15 mg at 03/23/22 0856   magnesium hydroxide (MILK OF MAGNESIA) suspension 30 mL  30 mL Oral Daily PRN Patrecia Pour, NP       metFORMIN (GLUCOPHAGE) tablet 1,000 mg  1,000 mg Oral BID WC Patrecia Pour, NP   1,000 mg at 03/24/22 2774    metoprolol succinate (TOPROL-XL) 24 hr tablet 50 mg  50 mg Oral Daily Patrecia Pour, NP   50 mg at 03/23/22 0856   nitroGLYCERIN (NITROSTAT) SL tablet 0.4 mg  0.4 mg Sublingual Q5 Min x 3 PRN Patrecia Pour, NP       Oral care mouth rinse  15 mL Mouth Rinse PRN Patrecia Pour, NP       paliperidone (INVEGA) 24 hr tablet 9 mg  9 mg Oral QHS Patrecia Pour, NP   9 mg at 03/23/22 2100   rosuvastatin (CRESTOR) tablet 40 mg  40 mg Oral Daily Patrecia Pour, NP   40 mg at 03/23/22 0856   ziprasidone (GEODON) injection 10 mg  10 mg Intramuscular Q6H PRN Patrecia Pour, NP        Lab Results:  Results for orders placed or performed during the hospital encounter of 03/21/22 (from the past 48 hour(s))  Glucose, capillary     Status: Abnormal   Collection Time: 03/22/22 11:35 AM  Result Value Ref Range   Glucose-Capillary 152 (H) 70 - 99 mg/dL    Comment: Glucose reference range applies only to samples taken after fasting for at least 8 hours.  Glucose, capillary     Status: None   Collection Time: 03/22/22  2:43 PM  Result Value Ref Range   Glucose-Capillary 83 70 - 99 mg/dL    Comment: Glucose reference range applies only to  samples taken after fasting for at least 8 hours.  Glucose, capillary     Status: Abnormal   Collection Time: 03/22/22  4:28 PM  Result Value Ref Range   Glucose-Capillary 184 (H) 70 - 99 mg/dL    Comment: Glucose reference range applies only to samples taken after fasting for at least 8 hours.  Glucose, capillary     Status: None   Collection Time: 03/22/22  8:32 PM  Result Value Ref Range   Glucose-Capillary 87 70 - 99 mg/dL    Comment: Glucose reference range applies only to samples taken after fasting for at least 8 hours.  Glucose, capillary     Status: Abnormal   Collection Time: 03/23/22  7:28 AM  Result Value Ref Range   Glucose-Capillary 123 (H) 70 - 99 mg/dL    Comment: Glucose reference range applies only to samples taken after fasting for at least 8  hours.  Glucose, capillary     Status: None   Collection Time: 03/23/22 11:28 AM  Result Value Ref Range   Glucose-Capillary 94 70 - 99 mg/dL    Comment: Glucose reference range applies only to samples taken after fasting for at least 8 hours.  Glucose, capillary     Status: Abnormal   Collection Time: 03/23/22  4:24 PM  Result Value Ref Range   Glucose-Capillary 140 (H) 70 - 99 mg/dL    Comment: Glucose reference range applies only to samples taken after fasting for at least 8 hours.  Glucose, capillary     Status: None   Collection Time: 03/23/22  8:08 PM  Result Value Ref Range   Glucose-Capillary 91 70 - 99 mg/dL    Comment: Glucose reference range applies only to samples taken after fasting for at least 8 hours.   Comment 1 Notify RN   Glucose, capillary     Status: Abnormal   Collection Time: 03/24/22  7:55 AM  Result Value Ref Range   Glucose-Capillary 107 (H) 70 - 99 mg/dL    Comment: Glucose reference range applies only to samples taken after fasting for at least 8 hours.   *Note: Due to a large number of results and/or encounters for the requested time period, some results have not been displayed. A complete set of results can be found in Results Review.    Blood Alcohol level:  Lab Results  Component Value Date   ETH <10 03/07/2022   ETH <10 35/36/1443    Metabolic Disorder Labs: Lab Results  Component Value Date   HGBA1C 9.7 (H) 03/07/2022   MPG 231.69 03/07/2022   MPG 192 04/19/2021   No results found for: "PROLACTIN" Lab Results  Component Value Date   CHOL 215 (H) 03/08/2022   TRIG 139 03/08/2022   HDL 43 03/08/2022   CHOLHDL 5.0 03/08/2022   VLDL 28 03/08/2022   LDLCALC 144 (H) 03/08/2022   LDLCALC 80 04/19/2021      Musculoskeletal: Gait & Station: uses walker    Psychiatric Specialty Exam: Physical Exam Vitals and nursing note reviewed.  Constitutional:      Appearance: Normal appearance.  HENT:     Head: Normocephalic.     Nose: Nose  normal.  Pulmonary:     Effort: Pulmonary effort is normal.  Musculoskeletal:        General: Normal range of motion.     Cervical back: Normal range of motion.  Neurological:     General: No focal deficit present.     Mental Status:  She is alert and oriented to person, place, and time.  Psychiatric:        Attention and Perception: Attention and perception normal.        Mood and Affect: Mood is anxious.        Speech: Speech normal. Speech is not tangential.        Behavior: Behavior is cooperative.        Thought Content: Thought content normal.        Cognition and Memory: Cognition and memory normal.        Judgment: Judgment normal.     Review of Systems  Psychiatric/Behavioral:  The patient is nervous/anxious.   All other systems reviewed and are negative.   Blood pressure 124/73, pulse 89, temperature 97.6 F (36.4 C), temperature source Oral, resp. rate 18, height '5\' 5"'  (1.651 m), weight 69.9 kg, SpO2 100 %.Body mass index is 25.63 kg/m.  General Appearance: Casual  Eye Contact:  Good  Speech:   tangental   Volume:  Normal  Mood:  Euthymic  Affect:  Appropriate  Thought Process:  Tangential  Orientation:  appears Full (Time, Place, and Person)  Thought Content:  Delusions, Hallucinations: Visual, Paranoid Ideation, and Tangential  Suicidal Thoughts:  No  Homicidal Thoughts:  No  Memory:  Recent;   Good  Judgement:  Fair  Insight:  Fair  Psychomotor Activity:  Normal  Concentration:  Concentration: Fair and Attention Span: Fair  Recall:  NA  Fund of Knowledge:  Fair  Language:  Good  Akathisia:  No  Handed:  Left  AIMS (if indicated):     Assets:  Resilience  ADL's:  Intact  Cognition:  WNL  Sleep:         Physical Exam: Physical Exam Vitals and nursing note reviewed.  Constitutional:      Appearance: Normal appearance.  HENT:     Head: Normocephalic.     Nose: Nose normal.  Pulmonary:     Effort: Pulmonary effort is normal.  Musculoskeletal:         General: Normal range of motion.     Cervical back: Normal range of motion.  Neurological:     General: No focal deficit present.     Mental Status: She is alert and oriented to person, place, and time.  Psychiatric:        Attention and Perception: Attention and perception normal.        Mood and Affect: Mood is anxious.        Speech: Speech normal. Speech is not tangential.        Behavior: Behavior is cooperative.        Thought Content: Thought content normal.        Cognition and Memory: Cognition and memory normal.        Judgment: Judgment normal.    Review of Systems  Psychiatric/Behavioral:  The patient is nervous/anxious.   All other systems reviewed and are negative.  Blood pressure 124/73, pulse 89, temperature 97.6 F (36.4 C), temperature source Oral, resp. rate 18, height '5\' 5"'  (1.651 m), weight 69.9 kg, SpO2 100 %. Body mass index is 25.63 kg/m.  Treatment Plan Summary: Daily contact with patient to assess and evaluate symptoms and progress in treatment, Medication management, and Plan : Schizoaffective disorder, bipolar type: Depakote 500 mg BID Invega 9 mg daily  Waylan Boga, NP 03/24/2022, 9:02 AM

## 2022-03-24 NOTE — BHH Group Notes (Signed)
Patient attended group therapy.

## 2022-03-25 LAB — GLUCOSE, CAPILLARY
Glucose-Capillary: 127 mg/dL — ABNORMAL HIGH (ref 70–99)
Glucose-Capillary: 229 mg/dL — ABNORMAL HIGH (ref 70–99)
Glucose-Capillary: 151 mg/dL — ABNORMAL HIGH (ref 70–99)
Glucose-Capillary: 119 mg/dL — ABNORMAL HIGH (ref 70–99)
Glucose-Capillary: 59 mg/dL — ABNORMAL LOW (ref 70–99)
Glucose-Capillary: 99 mg/dL (ref 70–99)

## 2022-03-25 NOTE — Progress Notes (Signed)
Patient is A+O x 3 sometimes 4. She denies SI/HI/AVH. When walking towards patient to provide care, this nurse observed the patient leaning to the side having a full blown conversation to an empty area. 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 I do not have hallucinations like other people." Patient then proceeded to bring me the novel that she is working on. She denies depression and anxiety and is otherwise pleasant. Patient received PRN Tylenol, Pepto Bismol, and 2 puffs of an Albuterol inhaler. All tolerated well. Patient has a good appetite. Q15 minute unit checks remain in place.

## 2022-03-25 NOTE — [facility] Group Notes (Signed)
Patient attended group therapy today.

## 2022-03-25 NOTE — Group Note (Signed)
LCSW Group Therapy Note       Date/Time:  Mood: Type of Therapy and Topic: Group Therapy: Avoiding Self-Sabotaging and Enabling Behaviors Participation Level: Active Description of Group:  In this group, patients will learn how to identify obstacles, self-sabotaging and enabling behaviors, as well as: what are they, why do we do them and what needs these behaviors meet. Discuss unhealthy relationships and how to have positive healthy boundaries with those that sabotage and enable. Explore aspects of self-sabotage and enabling in yourself and how to limit these self-destructive behaviors in everyday life.     Therapeutic Goals: 1. Patient will identify one obstacle that relates to self-sabotage and enabling behaviors 2. Patient will identify one personal self-sabotaging or enabling behavior they did prior to admission 3. Patient will state a plan to change the above identified behavior 4. Patient will demonstrate ability to communicate their needs through discussion and/or role play.      Summary of Patient Progress: Patient was present in group and engaged in gorup discussion.  Patient was often inappropriate and off topic.  Patient displayed loose connections.  At one point patient stated "You know, one thing in life I was never able to do was be a whore, you know a prostitute?".  This was stated at random.  Patient was observed to sit with her eyes closed and make hand gestures as if making the cross on herself or snatching things from others.      Therapeutic Modalities:  Cognitive Behavioral Therapy Person-Centered Therapy Motivational Interviewing  Rozann Lesches, [place] 03/25/2022  3:00 PM

## 2022-03-25 NOTE — Progress Notes (Addendum)
Called the daughter, Mahrosh Donnell, her POA to provide an update.  No answer or ability to leave a message.  This provider was able to catch up with a little before 4 pm and discussed her care.  She is very pleasant and wants to continue to be in contact to best care for her mother.  Waylan Boga, PMHNP

## 2022-03-25 NOTE — Plan of Care (Signed)
  Problem: Education: Goal: Knowledge of General Education information will improve Description: Including pain rating scale, medication(s)/side effects and non-pharmacologic comfort measures 03/25/2022 1541 by Ileene Musa, RN Outcome: Progressing 03/25/2022 1241 by Ileene Musa, RN Outcome: Progressing   Problem: Health Behavior/Discharge Planning: Goal: Ability to manage health-related needs will improve 03/25/2022 1541 by Ileene Musa, RN Outcome: Progressing 03/25/2022 1241 by Ileene Musa, RN Outcome: Progressing   Problem: Clinical Measurements: Goal: Ability to maintain clinical measurements within normal limits will improve 03/25/2022 1541 by Ileene Musa, RN Outcome: Progressing 03/25/2022 1241 by Ileene Musa, RN Outcome: Progressing Goal: Will remain free from infection Outcome: Progressing Goal: Diagnostic test results will improve 03/25/2022 1541 by Ileene Musa, RN Outcome: Progressing 03/25/2022 1241 by Ileene Musa, RN Outcome: Progressing Goal: Respiratory complications will improve 03/25/2022 1541 by Ileene Musa, RN Outcome: Progressing 03/25/2022 1241 by Ileene Musa, RN Outcome: Progressing Goal: Cardiovascular complication will be avoided 03/25/2022 1541 by Ileene Musa, RN Outcome: Progressing 03/25/2022 1241 by Ileene Musa, RN Outcome: Progressing   Problem: Activity: Goal: Risk for activity intolerance will decrease 03/25/2022 1541 by Ileene Musa, RN Outcome: Progressing 03/25/2022 1241 by Ileene Musa, RN Outcome: Progressing   Problem: Nutrition: Goal: Adequate nutrition will be maintained 03/25/2022 1541 by Ileene Musa, RN Outcome: Progressing 03/25/2022 1241 by Ileene Musa, RN Outcome: Progressing   Problem: Coping: Goal: Level of anxiety will decrease 03/25/2022 1541 by Ileene Musa, RN Outcome: Progressing 03/25/2022 1241 by Ileene Musa, RN Outcome: Progressing   Problem: Elimination: Goal: Will not experience complications related to bowel motility 03/25/2022 1541 by Ileene Musa, RN Outcome: Progressing 03/25/2022 1241 by Ileene Musa, RN Outcome: Progressing Goal: Will not experience complications related to urinary retention 03/25/2022 1541 by Ileene Musa, RN Outcome: Progressing 03/25/2022 1610 by Ileene Musa, RN Outcome: Progressing   Problem: Pain Managment: Goal: General experience of comfort will improve 03/25/2022 1541 by Ileene Musa, RN Outcome: Progressing 03/25/2022 1241 by Ileene Musa, RN Outcome: Progressing   Problem: Safety: Goal: Ability to remain free from injury will improve 03/25/2022 1541 by Ileene Musa, RN Outcome: Progressing 03/25/2022 1241 by Ileene Musa, RN Outcome: Progressing   Problem: Skin Integrity: Goal: Risk for impaired skin integrity will decrease 03/25/2022 1541 by Ileene Musa, RN Outcome: Progressing 03/25/2022 1241 by Ileene Musa, RN Outcome: Progressing   Problem: Education: Goal: Understanding of cardiac disease, CV risk reduction, and recovery process will improve Outcome: Progressing Goal: Individualized Educational Video(s) Outcome: Progressing   Problem: Activity: Goal: Ability to tolerate increased activity will improve 03/25/2022 1541 by Ileene Musa, RN Outcome: Progressing 03/25/2022 1241 by Ileene Musa, RN Outcome: Progressing   Problem: Cardiac: Goal: Ability to achieve and maintain adequate cardiovascular perfusion will improve Outcome: Progressing   Problem: Health Behavior/Discharge Planning: Goal: Ability to safely manage health-related needs after discharge will improve 03/25/2022 1541 by Ileene Musa, RN Outcome: Progressing 03/25/2022 1241 by Ileene Musa, RN Outcome: Progressing

## 2022-03-25 NOTE — Progress Notes (Signed)
Patient denies SI, HI & AVH. She reports just needing medication adjustment. She was up off and on through out most of the night. No new behavioral issues to report on shift at this time.

## 2022-03-25 NOTE — B[facility] Counselor (Signed)
CSW attempted to contact [name] Long, legal guardian/daughter, [phone], at the daughters and nurses request.   CSW unable to speak with the daughter and left a HIPAA compliant voicemail.  [name], MSW, LCSW 03/25/2022 3:05 PM

## 2022-03-25 NOTE — Progress Notes (Cosign Needed Addendum)
[facility] MD Progress Note  03/25/2022 10:42 AM [name] Long  MRN:  [id number]  Subjective:  Pt is a 71 yo female her with principal problem of Schitzoaffective disorder, bipolar type. She is pleasant during the interview. She appears somewhat delusional during the interview. She provides "love notes" she is writing. She is unsure of the name of her love interest is, states it might be "Kip" or "Marya Amsler." She states they met when she asked a man on the "street" to help her find a house. She is tearful when stating his is married and they cannot be together. She later states he is separated.  She denies depression, anxiety, SI, HI, AVH.  She is concerned about her "concussion" and is requesting X rays. She reports she "blanks out"/"dozes off" during the day, and reports if she sleeps too much her body "locks up." But she reports she slept well last night.  She reports her appetite is well but has trouble eating when she dozes off. She also reports heart burn earlier this morning that ceased with Peptobismal.   Principal Problem: Schizoaffective disorder, bipolar type ([place]) Diagnosis: Principal Problem:   Schizoaffective disorder, bipolar type ([place])  Total Time spent with patient: 30 minutes  Past Psychiatric History: schizoaffective disorder, bipolar type  Past Medical History:  Past Medical History:  Diagnosis Date   Abscessed tooth 02/09/2020   Acute on chronic congestive heart failure ([place])    Adenomatous colon polyp    Arthritis    "real bad; all over" (07/12/2017)   Asthma    Atrial fibrillation ([place])    BIPOLAR DISORDER 08/31/2006   Qualifier: Diagnosis of  By: Dorathy Daft MD, Marjory Lies     CHRONIC KIDNEY DISEASE STAGE II (MILD) 09/14/2009   Annotation: eGFR 46 Qualifier: Diagnosis of  By: Jess Barters MD, Erik     Chronic lower back pain    Congestive heart failure (CHF) (HCC)    COPD (chronic obstructive pulmonary disease) ([place]) 09/23/2010   Diagnosed at [facility] in 2008 (Dr. Annamaria Boots)    DM  (diabetes mellitus) type II controlled with renal manifestation ([place]) 08/31/2006   Qualifier: Diagnosis of  By: Dorathy Daft MD, Marjory Lies     Dyspnea    "all my life; since 6th grade" (07/12/2017)   Fatty liver    HYPERCHOLESTEROLEMIA 08/31/2006   Intolerance to Lipitor OK on Crestor but medicaid no longer covering     HYPERTENSION, BENIGN SYSTEMIC 08/31/2006   Qualifier: Diagnosis of  By: Dorathy Daft MD, Aaron     Hypokalemia    HYPOTHYROIDISM, UNSPECIFIED 08/31/2006   Qualifier: Diagnosis of  By: Dorathy Daft MD, Marjory Lies     Leg swelling 03/14/2018   Pneumonia    "3 times" (07/12/2017)   Pulmonary nodule    Renal cyst    Schizophrenia ([place])    Scoliosis    Stomach problems    Thyroid disorder     Past Surgical History:  Procedure Laterality Date   CESAREAN SECTION     FOOT FRACTURE SURGERY Right    "steel plate in it"   FOOT SURGERY     " born w/dislocated foot"   FRACTURE SURGERY     KNEE ARTHROSCOPY Right    TOE SURGERY Bilateral    "both pinky toes"   TONSILLECTOMY AND ADENOIDECTOMY     Family History:  Family History  Problem Relation Age of Onset   Heart disease Mother 75   Rectal cancer Mother    Diabetes Mother    High Cholesterol Mother  Hypertension Mother    Stroke Mother    Diabetes Father 71   Hypertension Father    Breast cancer Sister    High Cholesterol Sister    Hypertension Sister    Breast cancer Sister    Heart disease Brother    High Cholesterol Brother    Hypertension Brother    Colon cancer Maternal Grandmother    Asthma Daughter    Asthma Daughter    Asthma Son    Stomach cancer Neg Hx    Esophageal cancer Neg Hx    Pancreatic cancer Neg Hx    Family Psychiatric  History: see above Social History:  Social History   Substance and Sexual Activity  Alcohol Use Not Currently   Comment: 07/12/2017 "stopped 2 yr ago; did have a drink over the holidays recently"     Social History   Substance and Sexual Activity  Drug Use No    Social History    Socioeconomic History   Marital status: Divorced    Spouse name: Not on file   Number of children: 4   Years of education: 12   Highest education level: High school graduate  Occupational History   Not on file  Tobacco Use   Smoking status: Every Day    Packs/day: 0.25    Years: 45.00    Total pack years: 11.25    Types: Cigarettes   Smokeless tobacco: Never   Tobacco comments:    patient states she smokes 1 cigarette per month  Vaping Use   Vaping Use: Never used  Substance and Sexual Activity   Alcohol use: Not Currently    Comment: 07/12/2017 "stopped 2 yr ago; did have a drink over the holidays recently"   Drug use: No   Sexual activity: Not Currently  Other Topics Concern   Not on file  Social History Narrative   Patient lives alone in [place].    Patient does not drive, she uses insurance transportation or her children.   Patient enjoys twirling her paton, listening to music, dancing, and spending time with family.    Patient enjoys celebrating all holidays and birthdays of loved ones.   Social Determinants of Health   Financial Resource Strain: Low Risk  (07/18/2019)   Overall Financial Resource Strain (CARDIA)    Difficulty of Paying Living Expenses: Not very hard  Food Insecurity: No Food Insecurity (03/21/2022)   Hunger Vital Sign    Worried About Running Out of Food in the Last Year: Never true    Ran Out of Food in the Last Year: Never true  Transportation Needs: No Transportation Needs (03/21/2022)   PRAPARE - Hydrologist (Medical): No    Lack of Transportation (Non-Medical): No  Physical Activity: Inactive (07/18/2019)   Exercise Vital Sign    Days of Exercise per Week: 0 days    Minutes of Exercise per Session: 0 min  Stress: No Stress Concern Present (07/18/2019)   [place]    Feeling of Stress : Only a little  Social Connections: Moderately Integrated  (07/18/2019)   Social Connection and Isolation Panel [NHANES]    Frequency of Communication with Friends and Family: More than three times a week    Frequency of Social Gatherings with Friends and Family: More than three times a week    Attends Religious Services: More than 4 times per year    Active Member of [employer] or Organizations: Yes  Attends Archivist Meetings: 1 to 4 times per year    Marital Status: Divorced   Additional Social History: lives with family    Sleep: slept well per pt report   Appetite:  Good  Current Medications: Current Facility-Administered Medications  Medication Dose Route Frequency Provider Last Rate Last Admin   acetaminophen (TYLENOL) tablet 650 mg  650 mg Oral Q6H PRN Patrecia Pour, NP   650 mg at 03/25/22 1003   albuterol (VENTOLIN HFA) 108 (90 Base) MCG/ACT inhaler 2 puff  2 puff Inhalation Q6H PRN Noralee Space, RPH   2 puff at 03/25/22 1002   alum & mag hydroxide-simeth (MAALOX/MYLANTA) 200-200-20 MG/5ML suspension 30 mL  30 mL Oral Q4H PRN Patrecia Pour, NP       amLODipine (NORVASC) tablet 5 mg  5 mg Oral Daily Patrecia Pour, NP   5 mg at 03/25/22 1005   apixaban (ELIQUIS) tablet 5 mg  5 mg Oral BID Patrecia Pour, NP   5 mg at 03/25/22 1005   aspirin EC tablet 81 mg  81 mg Oral Daily Patrecia Pour, NP   81 mg at 03/25/22 1004   benzonatate (TESSALON) capsule 200 mg  200 mg Oral TID PRN Patrecia Pour, NP       bismuth subsalicylate (PEPTO BISMOL) 262 MG/15ML suspension 30 mL  30 mL Oral Q8H PRN Patrecia Pour, NP   30 mL at 03/25/22 1000   dapagliflozin propanediol (FARXIGA) tablet 5 mg  5 mg Oral Daily Patrecia Pour, NP   5 mg at 03/25/22 1004   divalproex (DEPAKOTE) DR tablet 500 mg  500 mg Oral Q12H Patrecia Pour, NP   500 mg at 03/25/22 1005   insulin aspart (novoLOG) injection 0-9 Units  0-9 Units Subcutaneous TID WC Patrecia Pour, NP   3 Units at 03/25/22 0754   isosorbide mononitrate (IMDUR) 24 hr tablet 15 mg   15 mg Oral Daily Patrecia Pour, NP   15 mg at 03/25/22 1004   magnesium hydroxide (MILK OF MAGNESIA) suspension 30 mL  30 mL Oral Daily PRN Patrecia Pour, NP       metFORMIN (GLUCOPHAGE) tablet 1,000 mg  1,000 mg Oral BID WC Patrecia Pour, NP   1,000 mg at 03/25/22 0755   metoprolol succinate (TOPROL-XL) 24 hr tablet 50 mg  50 mg Oral Daily Patrecia Pour, NP   50 mg at 03/25/22 1005   nitroGLYCERIN (NITROSTAT) SL tablet 0.4 mg  0.4 mg Sublingual Q5 Min x 3 PRN Patrecia Pour, NP       Oral care mouth rinse  15 mL Mouth Rinse PRN Patrecia Pour, NP       paliperidone (INVEGA) 24 hr tablet 9 mg  9 mg Oral QHS Patrecia Pour, NP   9 mg at 03/24/22 2100   rosuvastatin (CRESTOR) tablet 40 mg  40 mg Oral Daily Patrecia Pour, NP   40 mg at 03/25/22 1004   ziprasidone (GEODON) injection 10 mg  10 mg Intramuscular Q6H PRN Patrecia Pour, NP        Lab Results:  Results for orders placed or performed during the hospital encounter of 03/21/22 (from the past 48 hour(s))  Glucose, capillary     Status: None   Collection Time: 03/23/22 11:28 AM  Result Value Ref Range   Glucose-Capillary 94 70 - 99 mg/dL    Comment: Glucose reference range applies  only to samples taken after fasting for at least 8 hours.  Glucose, capillary     Status: Abnormal   Collection Time: 03/23/22  4:24 PM  Result Value Ref Range   Glucose-Capillary 140 (H) 70 - 99 mg/dL    Comment: Glucose reference range applies only to samples taken after fasting for at least 8 hours.  Glucose, capillary     Status: None   Collection Time: 03/23/22  8:08 PM  Result Value Ref Range   Glucose-Capillary 91 70 - 99 mg/dL    Comment: Glucose reference range applies only to samples taken after fasting for at least 8 hours.   Comment 1 Notify RN   Glucose, capillary     Status: Abnormal   Collection Time: 03/24/22  7:55 AM  Result Value Ref Range   Glucose-Capillary 107 (H) 70 - 99 mg/dL    Comment: Glucose reference range  applies only to samples taken after fasting for at least 8 hours.  Glucose, capillary     Status: None   Collection Time: 03/24/22 11:09 AM  Result Value Ref Range   Glucose-Capillary 90 70 - 99 mg/dL    Comment: Glucose reference range applies only to samples taken after fasting for at least 8 hours.  Glucose, capillary     Status: None   Collection Time: 03/24/22 11:33 AM  Result Value Ref Range   Glucose-Capillary 86 70 - 99 mg/dL    Comment: Glucose reference range applies only to samples taken after fasting for at least 8 hours.  Glucose, capillary     Status: Abnormal   Collection Time: 03/24/22  4:15 PM  Result Value Ref Range   Glucose-Capillary 168 (H) 70 - 99 mg/dL    Comment: Glucose reference range applies only to samples taken after fasting for at least 8 hours.  Glucose, capillary     Status: Abnormal   Collection Time: 03/24/22  8:13 PM  Result Value Ref Range   Glucose-Capillary 114 (H) 70 - 99 mg/dL    Comment: Glucose reference range applies only to samples taken after fasting for at least 8 hours.   Comment 1 Notify RN   Glucose, capillary     Status: Abnormal   Collection Time: 03/25/22  6:31 AM  Result Value Ref Range   Glucose-Capillary 127 (H) 70 - 99 mg/dL    Comment: Glucose reference range applies only to samples taken after fasting for at least 8 hours.  Glucose, capillary     Status: Abnormal   Collection Time: 03/25/22  7:21 AM  Result Value Ref Range   Glucose-Capillary 229 (H) 70 - 99 mg/dL    Comment: Glucose reference range applies only to samples taken after fasting for at least 8 hours.   *Note: Due to a large number of results and/or encounters for the requested time period, some results have not been displayed. A complete set of results can be found in Results Review.    Blood Alcohol level:  Lab Results  Component Value Date   ETH <10 03/07/2022   ETH <10 24/58/0998    Metabolic Disorder Labs: Lab Results  Component Value Date    HGBA1C 9.7 (H) 03/07/2022   MPG 231.69 03/07/2022   MPG 192 04/19/2021   No results found for: "PROLACTIN" Lab Results  Component Value Date   CHOL 215 (H) 03/08/2022   TRIG 139 03/08/2022   HDL 43 03/08/2022   CHOLHDL 5.0 03/08/2022   VLDL 28 03/08/2022   LDLCALC 144 (  H) 03/08/2022   [place] 80 04/19/2021      Musculoskeletal: Gait & Station: uses walker    Psychiatric Specialty Exam: Physical Exam Vitals and nursing note reviewed.  Constitutional:      Appearance: Normal appearance.  HENT:     Head: Normocephalic.     Nose: Nose normal.  Pulmonary:     Effort: Pulmonary effort is normal.  Musculoskeletal:        General: Normal range of motion.     Cervical back: Normal range of motion.  Neurological:     General: No focal deficit present.     Mental Status: She is alert and oriented to person, place, and time.  Psychiatric:        Attention and Perception: Attention and perception normal.        Mood and Affect: Mood is anxious.        Speech: Speech normal. Speech is not tangential.        Behavior: Behavior is cooperative.        Thought Content: Thought content normal.        Cognition and Memory: Cognition and memory normal.        Judgment: Judgment normal.     Review of Systems  Psychiatric/Behavioral:  The patient is nervous/anxious.   All other systems reviewed and are negative.   Blood pressure 106/71, pulse 78, temperature 98.4 F (36.9 C), temperature source Oral, resp. rate 16, height _0  (1.651 m), weight 69.9 kg, SpO2 100 %.Body mass index is 25.63 kg/m.  General Appearance: Casual  Eye Contact:  Good  Speech: normal   Volume:  Normal  Mood:  Euthymic, tearful when discussing her love interest   Affect:  Appropriate  Thought Process:  slightly delusional   Orientation:  appears Full (Time, Place, and Person)  Thought Content:  delusional   Suicidal Thoughts:  No  Homicidal Thoughts:  No  Memory:  Recent;   Good  Judgement:  Fair   Insight:  Fair  Psychomotor Activity:  Normal, uses walker   Concentration:  Concentration: Fair and Attention Span: Fair  Recall:  NA  Fund of Knowledge:  Fair  Language:  Good  Akathisia:  No  Handed:  Left  AIMS (if indicated):     Assets:  Resilience  ADL's:  Intact  Cognition:  WNL  Sleep:         Physical Exam: Physical Exam Vitals and nursing note reviewed.  Constitutional:      Appearance: Normal appearance.  HENT:     Head: Normocephalic.     Nose: Nose normal.  Pulmonary:     Effort: Pulmonary effort is normal.  Musculoskeletal:        General: Normal range of motion.     Cervical back: Normal range of motion.  Neurological:     General: No focal deficit present.     Mental Status: She is alert and oriented to person, place, and time.  Psychiatric:        Attention and Perception: Attention and perception normal.        Mood and Affect: Mood is anxious.        Speech: Speech normal. Speech is not tangential.        Behavior: Behavior is cooperative.        Thought Content: Thought content normal.        Cognition and Memory: Cognition and memory normal.        Judgment: Judgment normal.  Review of Systems  Psychiatric/Behavioral:  The patient is nervous/anxious.   All other systems reviewed and are negative.  Blood pressure 106/71, pulse 78, temperature 98.4 F (36.9 C), temperature source Oral, resp. rate 16, height _0  (1.651 m), weight 69.9 kg, SpO2 100 %. Body mass index is 25.63 kg/m.  Treatment Plan Summary: Daily contact with patient to assess and evaluate symptoms and progress in treatment, Medication management, and Plan : Schizoaffective disorder, bipolar type: Depakote 500 mg BID Invega 9 mg daily  Waylan Boga, NP 03/25/2022, 10:42 AM

## 2022-03-26 LAB — GLUCOSE, CAPILLARY
Glucose-Capillary: 110 mg/dL — ABNORMAL HIGH (ref 70–99)
Glucose-Capillary: 115 mg/dL — ABNORMAL HIGH (ref 70–99)
Glucose-Capillary: 117 mg/dL — ABNORMAL HIGH (ref 70–99)
Glucose-Capillary: 119 mg/dL — ABNORMAL HIGH (ref 70–99)
Glucose-Capillary: 150 mg/dL — ABNORMAL HIGH (ref 70–99)
Glucose-Capillary: 179 mg/dL — ABNORMAL HIGH (ref 70–99)

## 2022-03-26 LAB — CREATININE, SERUM
Creatinine, Ser: 1.24 mg/dL — ABNORMAL HIGH (ref 0.44–1.00)
GFR, Estimated: 47 mL/min — ABNORMAL LOW (ref >60)

## 2022-03-26 NOTE — Progress Notes (Signed)
Patient is alert and oriented times 4. Mood and affect appropriate. Patient rates pain as 7/10. She denies SI, HI, and AVH. Patient does endorse feelings of anxiety and depression at this time. Patient states she slept well last night. Evening medicines administered whole by mouth without difficulty. Patient ate snack in day room; appetite was fair. Patient remains on unit with Q15 minute checks in place.

## 2022-03-26 NOTE — Plan of Care (Signed)

## 2022-03-26 NOTE — Progress Notes (Signed)
[facility] MD Progress Note  03/26/2022 3:13 PM [name] Long  MRN:  [id number] Subjective: Patient seen and chart reviewed.  Neatly dressed and groomed.  Taking care of her ADLs well.  Patient today was not so focused on her delusions.  Wants to be discharged but was not demanding or intrusive about it.  Generally pleasant.  Complains of her chronic back pain but nothing more than that. Principal Problem: Schizoaffective disorder, bipolar type ([place]) Diagnosis: Principal Problem:   Schizoaffective disorder, bipolar type ([place])  Total Time spent with patient: 30 minutes  Past Psychiatric History: Patient with past history of psychotic symptoms intermittently diagnosis of schizoaffective  Past Medical History:  Past Medical History:  Diagnosis Date   Abscessed tooth 02/09/2020   Acute on chronic congestive heart failure ([place])    Adenomatous colon polyp    Arthritis    "real bad; all over" (07/12/2017)   Asthma    Atrial fibrillation ([place])    BIPOLAR DISORDER 08/31/2006   Qualifier: Diagnosis of  By: Dorathy Daft MD, Marjory Lies     CHRONIC KIDNEY DISEASE STAGE II (MILD) 09/14/2009   Annotation: eGFR 65 Qualifier: Diagnosis of  By: Jess Barters MD, Erik     Chronic lower back pain    Congestive heart failure (CHF) ([place])    COPD (chronic obstructive pulmonary disease) ([place]) 09/23/2010   Diagnosed at [facility] in 2008 (Dr. Annamaria Boots)    DM (diabetes mellitus) type II controlled with renal manifestation ([place]) 08/31/2006   Qualifier: Diagnosis of  By: Dorathy Daft MD, Marjory Lies     Dyspnea    "all my life; since 6th grade" (07/12/2017)   Fatty liver    HYPERCHOLESTEROLEMIA 08/31/2006   Intolerance to Lipitor OK on Crestor but medicaid no longer covering     HYPERTENSION, BENIGN SYSTEMIC 08/31/2006   Qualifier: Diagnosis of  By: Dorathy Daft MD, Aaron     Hypokalemia    HYPOTHYROIDISM, UNSPECIFIED 08/31/2006   Qualifier: Diagnosis of  By: Dorathy Daft MD, Marjory Lies     Leg swelling 03/14/2018   Pneumonia    "3 times" (07/12/2017)    Pulmonary nodule    Renal cyst    Schizophrenia ([place])    Scoliosis    Stomach problems    Thyroid disorder     Past Surgical History:  Procedure Laterality Date   CESAREAN SECTION     FOOT FRACTURE SURGERY Right    "steel plate in it"   FOOT SURGERY     " born w/dislocated foot"   FRACTURE SURGERY     KNEE ARTHROSCOPY Right    TOE SURGERY Bilateral    "both pinky toes"   TONSILLECTOMY AND ADENOIDECTOMY     Family History:  Family History  Problem Relation Age of Onset   Heart disease Mother 56   Rectal cancer Mother    Diabetes Mother    High Cholesterol Mother    Hypertension Mother    Stroke Mother    Diabetes Father 74   Hypertension Father    Breast cancer Sister    High Cholesterol Sister    Hypertension Sister    Breast cancer Sister    Heart disease Brother    High Cholesterol Brother    Hypertension Brother    Colon cancer Maternal Grandmother    Asthma Daughter    Asthma Daughter    Asthma Son    Stomach cancer Neg Hx    Esophageal cancer Neg Hx    Pancreatic cancer Neg Hx    Family Psychiatric  History: See previous Social History:  Social History   Substance and Sexual Activity  Alcohol Use Not Currently   Comment: 07/12/2017 "stopped 2 yr ago; did have a drink over the holidays recently"     Social History   Substance and Sexual Activity  Drug Use No    Social History   Socioeconomic History   Marital status: Divorced    Spouse name: Not on file   Number of children: 4   Years of education: 12   Highest education level: High school graduate  Occupational History   Not on file  Tobacco Use   Smoking status: Every Day    Packs/day: 0.25    Years: 45.00    Total pack years: 11.25    Types: Cigarettes   Smokeless tobacco: Never   Tobacco comments:    patient states she smokes 1 cigarette per month  Vaping Use   Vaping Use: Never used  Substance and Sexual Activity   Alcohol use: Not Currently    Comment: 07/12/2017 "stopped 2 yr  ago; did have a drink over the holidays recently"   Drug use: No   Sexual activity: Not Currently  Other Topics Concern   Not on file  Social History Narrative   Patient lives alone in [place].    Patient does not drive, she uses insurance transportation or her children.   Patient enjoys twirling her paton, listening to music, dancing, and spending time with family.    Patient enjoys celebrating all holidays and birthdays of loved ones.   Social Determinants of Health   Financial Resource Strain: Low Risk  (07/18/2019)   Overall Financial Resource Strain (CARDIA)    Difficulty of Paying Living Expenses: Not very hard  Food Insecurity: No Food Insecurity (03/21/2022)   Hunger Vital Sign    Worried About Running Out of Food in the Last Year: Never true    Ran Out of Food in the Last Year: Never true  Transportation Needs: No Transportation Needs (03/21/2022)   PRAPARE - Hydrologist (Medical): No    Lack of Transportation (Non-Medical): No  Physical Activity: Inactive (07/18/2019)   Exercise Vital Sign    Days of Exercise per Week: 0 days    Minutes of Exercise per Session: 0 min  Stress: No Stress Concern Present (07/18/2019)   [place]    Feeling of Stress : Only a little  Social Connections: Moderately Integrated (07/18/2019)   Social Connection and Isolation Panel [NHANES]    Frequency of Communication with Friends and Family: More than three times a week    Frequency of Social Gatherings with Friends and Family: More than three times a week    Attends Religious Services: More than 4 times per year    Active Member of [employer] or Organizations: Yes    Attends Archivist Meetings: 1 to 4 times per year    Marital Status: Divorced   Additional Social History:                         Sleep: Fair  Appetite:  Fair  Current Medications: Current  Facility-Administered Medications  Medication Dose Route Frequency Provider Last Rate Last Admin   acetaminophen (TYLENOL) tablet 650 mg  650 mg Oral Q6H PRN Patrecia Pour, NP   650 mg at 03/26/22 0822   albuterol (VENTOLIN HFA) 108 (90 Base) MCG/ACT inhaler 2 puff  2 puff Inhalation Q6H PRN Noralee Space, RPH   2 puff at 03/26/22 0824   alum & mag hydroxide-simeth (MAALOX/MYLANTA) 200-200-20 MG/5ML suspension 30 mL  30 mL Oral Q4H PRN Patrecia Pour, NP       amLODipine (NORVASC) tablet 5 mg  5 mg Oral Daily Patrecia Pour, NP   5 mg at 03/26/22 6979   apixaban (ELIQUIS) tablet 5 mg  5 mg Oral BID Patrecia Pour, NP   5 mg at 03/26/22 0915   aspirin EC tablet 81 mg  81 mg Oral Daily Patrecia Pour, NP   81 mg at 03/26/22 0915   benzonatate (TESSALON) capsule 200 mg  200 mg Oral TID PRN Patrecia Pour, NP       bismuth subsalicylate (PEPTO BISMOL) 262 MG/15ML suspension 30 mL  30 mL Oral Q8H PRN Patrecia Pour, NP   30 mL at 03/26/22 0824   dapagliflozin propanediol (FARXIGA) tablet 5 mg  5 mg Oral Daily Patrecia Pour, NP   5 mg at 03/26/22 0917   divalproex (DEPAKOTE) DR tablet 500 mg  500 mg Oral Q12H Patrecia Pour, NP   500 mg at 03/26/22 0917   insulin aspart (novoLOG) injection 0-9 Units  0-9 Units Subcutaneous TID WC Patrecia Pour, NP   2 Units at 03/25/22 1142   isosorbide mononitrate (IMDUR) 24 hr tablet 15 mg  15 mg Oral Daily Patrecia Pour, NP   15 mg at 03/26/22 4801   magnesium hydroxide (MILK OF MAGNESIA) suspension 30 mL  30 mL Oral Daily PRN Patrecia Pour, NP       metFORMIN (GLUCOPHAGE) tablet 1,000 mg  1,000 mg Oral BID WC Patrecia Pour, NP   1,000 mg at 03/26/22 6553   metoprolol succinate (TOPROL-XL) 24 hr tablet 50 mg  50 mg Oral Daily Patrecia Pour, NP   50 mg at 03/26/22 0915   nitroGLYCERIN (NITROSTAT) SL tablet 0.4 mg  0.4 mg Sublingual Q5 Min x 3 PRN Patrecia Pour, NP       Oral care mouth rinse  15 mL Mouth Rinse PRN Patrecia Pour, NP        paliperidone (INVEGA) 24 hr tablet 9 mg  9 mg Oral QHS Patrecia Pour, NP   9 mg at 03/25/22 2116   rosuvastatin (CRESTOR) tablet 40 mg  40 mg Oral Daily Patrecia Pour, NP   40 mg at 03/26/22 7482   ziprasidone (GEODON) injection 10 mg  10 mg Intramuscular Q6H PRN Patrecia Pour, NP        Lab Results:  Results for orders placed or performed during the hospital encounter of 03/21/22 (from the past 48 hour(s))  Glucose, capillary     Status: Abnormal   Collection Time: 03/24/22  4:15 PM  Result Value Ref Range   Glucose-Capillary 168 (H) 70 - 99 mg/dL    Comment: Glucose reference range applies only to samples taken after fasting for at least 8 hours.  Glucose, capillary     Status: Abnormal   Collection Time: 03/24/22  8:13 PM  Result Value Ref Range   Glucose-Capillary 114 (H) 70 - 99 mg/dL    Comment: Glucose reference range applies only to samples taken after fasting for at least 8 hours.   Comment 1 Notify RN   Glucose, capillary     Status: Abnormal   Collection Time: 03/25/22  6:31 AM  Result Value Ref Range  Glucose-Capillary 127 (H) 70 - 99 mg/dL    Comment: Glucose reference range applies only to samples taken after fasting for at least 8 hours.  Glucose, capillary     Status: Abnormal   Collection Time: 03/25/22  7:21 AM  Result Value Ref Range   Glucose-Capillary 229 (H) 70 - 99 mg/dL    Comment: Glucose reference range applies only to samples taken after fasting for at least 8 hours.  Glucose, capillary     Status: Abnormal   Collection Time: 03/25/22 11:34 AM  Result Value Ref Range   Glucose-Capillary 151 (H) 70 - 99 mg/dL    Comment: Glucose reference range applies only to samples taken after fasting for at least 8 hours.  Glucose, capillary     Status: Abnormal   Collection Time: 03/25/22  4:16 PM  Result Value Ref Range   Glucose-Capillary 119 (H) 70 - 99 mg/dL    Comment: Glucose reference range applies only to samples taken after fasting for at least 8  hours.  Glucose, capillary     Status: Abnormal   Collection Time: 03/25/22  7:22 PM  Result Value Ref Range   Glucose-Capillary 59 (L) 70 - 99 mg/dL    Comment: Glucose reference range applies only to samples taken after fasting for at least 8 hours.  Glucose, capillary     Status: None   Collection Time: 03/25/22  9:50 PM  Result Value Ref Range   Glucose-Capillary 99 70 - 99 mg/dL    Comment: Glucose reference range applies only to samples taken after fasting for at least 8 hours.  Glucose, capillary     Status: Abnormal   Collection Time: 03/26/22 12:02 AM  Result Value Ref Range   Glucose-Capillary 150 (H) 70 - 99 mg/dL    Comment: Glucose reference range applies only to samples taken after fasting for at least 8 hours.  Glucose, capillary     Status: Abnormal   Collection Time: 03/26/22  7:34 AM  Result Value Ref Range   Glucose-Capillary 110 (H) 70 - 99 mg/dL    Comment: Glucose reference range applies only to samples taken after fasting for at least 8 hours.  Creatinine, serum     Status: Abnormal   Collection Time: 03/26/22  7:37 AM  Result Value Ref Range   Creatinine, Ser 1.24 (H) 0.44 - 1.00 mg/dL   GFR, Estimated 47 (L) >60 mL/min    Comment: (NOTE) Calculated using the CKD-EPI Creatinine Equation (2021) Performed at [facility], [address] [place], [place] [address]   Glucose, capillary     Status: Abnormal   Collection Time: 03/26/22 11:46 AM  Result Value Ref Range   Glucose-Capillary 115 (H) 70 - 99 mg/dL    Comment: Glucose reference range applies only to samples taken after fasting for at least 8 hours.   *Note: Due to a large number of results and/or encounters for the requested time period, some results have not been displayed. A complete set of results can be found in Results Review.    Blood Alcohol level:  Lab Results  Component Value Date   ETH <10 03/07/2022   ETH <10 12/25/7626    Metabolic Disorder Labs: Lab Results   Component Value Date   HGBA1C 9.7 (H) 03/07/2022   MPG 231.69 03/07/2022   MPG 192 04/19/2021   No results found for: "PROLACTIN" Lab Results  Component Value Date   CHOL 215 (H) 03/08/2022   TRIG 139 03/08/2022  HDL 43 03/08/2022   CHOLHDL 5.0 03/08/2022   VLDL 28 03/08/2022   LDLCALC 144 (H) 03/08/2022   LDLCALC 80 04/19/2021    Physical Findings: AIMS:  , ,  ,  ,    CIWA:    COWS:     Musculoskeletal: Strength & Muscle Tone: within normal limits Gait & Station: normal Patient leans: N/A  Psychiatric Specialty Exam:  Presentation  General Appearance: Appropriate for Environment; Casual  Eye Contact:Good  Speech:Normal Rate  Speech Volume:Normal  Handedness:Right   Mood and Affect  Mood:Euthymic  Affect:Congruent; Appropriate   Thought Process  Thought Processes:Goal Directed; Disorganized  Descriptions of Associations:Tangential  Orientation:Full (Time, Place and Person)  Thought Content:Illogical; Paranoid Ideation; Perseveration  History of Schizophrenia/Schizoaffective disorder:Yes  Duration of Psychotic Symptoms:Greater than six months  Hallucinations:No data recorded Ideas of Reference:Paranoia  Suicidal Thoughts:No data recorded Homicidal Thoughts:No data recorded  Sensorium  Memory:Immediate Fair; Recent Fair  Judgment:Impaired  Insight:Lacking; Shallow   Executive Functions  Concentration:Fair  Attention Span:Poor  [place]   Psychomotor Activity  Psychomotor Activity:No data recorded  Assets  Assets:Financial Resources/Insurance; Desire for Improvement; Housing; Social Support   Sleep  Sleep:No data recorded   Physical Exam: Physical Exam Vitals and nursing note reviewed.  Constitutional:      Appearance: Normal appearance.  HENT:     Head: Normocephalic and atraumatic.     Mouth/Throat:     Pharynx: Oropharynx is clear.  Eyes:     Pupils: Pupils are  equal, round, and reactive to light.  Cardiovascular:     Rate and Rhythm: Normal rate and regular rhythm.  Pulmonary:     Effort: Pulmonary effort is normal.     Breath sounds: Normal breath sounds.  Abdominal:     General: Abdomen is flat.     Palpations: Abdomen is soft.  Musculoskeletal:        General: Normal range of motion.  Skin:    General: Skin is warm and dry.  Neurological:     General: No focal deficit present.     Mental Status: She is alert. Mental status is at baseline.  Psychiatric:        Attention and Perception: Attention normal.        Mood and Affect: Mood normal.        Speech: Speech normal.        Behavior: Behavior normal.        Thought Content: Thought content normal.        Cognition and Memory: Cognition normal.        Judgment: Judgment is impulsive.    Review of Systems  Constitutional: Negative.   HENT: Negative.    Eyes: Negative.   Respiratory: Negative.    Cardiovascular: Negative.   Gastrointestinal: Negative.   Musculoskeletal: Negative.   Skin: Negative.   Neurological: Negative.   Psychiatric/Behavioral: Negative.     Blood pressure (!) 141/69, pulse 79, temperature 97.8 F (36.6 C), temperature source Oral, resp. rate 16, height '5\' 5"'  (1.651 m), weight 69.9 kg, SpO2 100 %. Body mass index is 25.63 kg/m.   Treatment Plan Summary: Medication management and Plan no change to medication management.  Continue current antipsychotics.  Alethia Berthold, MD 03/26/2022, 3:13 PM

## 2022-03-26 NOTE — Progress Notes (Signed)
Patient is alert and oriented times 2. Mood and affect appropriate. Patient rates pain as 8/10. She denies SI, HI, and AVH. Patient does endorse feelings of anxiety at this time. Patient states she slept well last night. Evening medicines administered whole by mouth without difficulty. Patient ate snack in day room; appetite was fair. Patient was given extra soda and graham crackers since her CBG was 59.  CBG rechecked after snack and it was noted to be 99.  Patient remains on unit with Q15 minute checks in place.     03/26/2022 0015--patient used to the call bell. When this RN went to the room to check on the patient, the patient was sitting on the side of the bed stating she has 10/10 heartburn pain and requested Pepto and Tylenol.  Patient was administered the requested medicines.  Patient also stated that she felt her blood sugar felt like it was low.  CBG checked and noted to be 150.  Patient asked for a diet soda, patient was offered water as an alternative given the time of the request, and in the nursing judgment of this RN, caffeine contained in the soda being contraindicated to getting rest at night time.

## 2022-03-26 NOTE — Progress Notes (Signed)
Patient is A+O x 3. She denies SI/HI/AVH. Patient presents with anxious mood and appropriate affect. Pain 5/10 and was medicated with PRN Tylenol. Decreased to 2/10 upon follow up. Other PRN meds include an Albuterol inhaler and Pepto Bismol. Patient played bingo with peers and interacted well with staff. Q15 minute unit checks remain in place.

## 2022-03-26 NOTE — Progress Notes (Signed)
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

## 2022-03-27 LAB — GLUCOSE, CAPILLARY
Glucose-Capillary: 122 mg/dL — ABNORMAL HIGH (ref 70–99)
Glucose-Capillary: 181 mg/dL — ABNORMAL HIGH (ref 70–99)
Glucose-Capillary: 179 mg/dL — ABNORMAL HIGH (ref 70–99)
Glucose-Capillary: 118 mg/dL — ABNORMAL HIGH (ref 70–99)

## 2022-03-27 NOTE — Progress Notes (Signed)
Pt presents with pleasant mood, affect labile. [name] Long states that she is doing better , voices that she '' I would like to get out of here, so I can follow up with the orthopedic doctor, I've got an appointment. '' Patient speech is tangential . Pt also observed to be paranoid of staff if they are unable to comply with her requests. Pt requesting hair spray this am and informed none available on the unit and she states '' I don't like that one girl over there. You can't trust her, she'll probably try to keep me in here. ''  Patient compliant with am medications. She is eating well on the unit and ambulatory with no acute issues. Able to make her needs known. Pt denies any SI or HI or A/V Hallucinations.

## 2022-03-27 NOTE — Progress Notes (Signed)
[facility] MD Progress Note  03/27/2022 12:14 PM [name] Long  MRN:  [id number] Subjective: Follow-up patient with schizoaffective disorder.  Patient was pleasant this morning.  Several minor physical request but nothing extreme.  Stable with her current behavior.  Denies suicidal or homicidal ideation.  Taking care of her ADLs pretty well Principal Problem: Schizoaffective disorder, bipolar type ([place]) Diagnosis: Principal Problem:   Schizoaffective disorder, bipolar type ([place])  Total Time spent with patient: 20 minutes  Past Psychiatric History: Past history of schizoaffective disorder  Past Medical History:  Past Medical History:  Diagnosis Date   Abscessed tooth 02/09/2020   Acute on chronic congestive heart failure ([place])    Adenomatous colon polyp    Arthritis    "real bad; all over" (07/12/2017)   Asthma    Atrial fibrillation ([place])    BIPOLAR DISORDER 08/31/2006   Qualifier: Diagnosis of  By: Dorathy Daft MD, Marjory Lies     CHRONIC KIDNEY DISEASE STAGE II (MILD) 09/14/2009   Annotation: eGFR 75 Qualifier: Diagnosis of  By: Jess Barters MD, Erik     Chronic lower back pain    Congestive heart failure (CHF) ([place])    COPD (chronic obstructive pulmonary disease) ([place]) 09/23/2010   Diagnosed at [facility] in 2008 (Dr. Annamaria Boots)    DM (diabetes mellitus) type II controlled with renal manifestation ([place]) 08/31/2006   Qualifier: Diagnosis of  By: Dorathy Daft MD, Marjory Lies     Dyspnea    "all my life; since 6th grade" (07/12/2017)   Fatty liver    HYPERCHOLESTEROLEMIA 08/31/2006   Intolerance to Lipitor OK on Crestor but medicaid no longer covering     HYPERTENSION, BENIGN SYSTEMIC 08/31/2006   Qualifier: Diagnosis of  By: Dorathy Daft MD, Aaron     Hypokalemia    HYPOTHYROIDISM, UNSPECIFIED 08/31/2006   Qualifier: Diagnosis of  By: Dorathy Daft MD, Marjory Lies     Leg swelling 03/14/2018   Pneumonia    "3 times" (07/12/2017)   Pulmonary nodule    Renal cyst    Schizophrenia ([place])    Scoliosis    Stomach problems     Thyroid disorder     Past Surgical History:  Procedure Laterality Date   CESAREAN SECTION     FOOT FRACTURE SURGERY Right    "steel plate in it"   FOOT SURGERY     " born w/dislocated foot"   FRACTURE SURGERY     KNEE ARTHROSCOPY Right    TOE SURGERY Bilateral    "both pinky toes"   TONSILLECTOMY AND ADENOIDECTOMY     Family History:  Family History  Problem Relation Age of Onset   Heart disease Mother 45   Rectal cancer Mother    Diabetes Mother    High Cholesterol Mother    Hypertension Mother    Stroke Mother    Diabetes Father 31   Hypertension Father    Breast cancer Sister    High Cholesterol Sister    Hypertension Sister    Breast cancer Sister    Heart disease Brother    High Cholesterol Brother    Hypertension Brother    Colon cancer Maternal Grandmother    Asthma Daughter    Asthma Daughter    Asthma Son    Stomach cancer Neg Hx    Esophageal cancer Neg Hx    Pancreatic cancer Neg Hx    Family Psychiatric  History: See previous Social History:  Social History   Substance and Sexual Activity  Alcohol Use Not Currently   Comment:  07/12/2017 "stopped 2 yr ago; did have a drink over the holidays recently"     Social History   Substance and Sexual Activity  Drug Use No    Social History   Socioeconomic History   Marital status: Divorced    Spouse name: Not on file   Number of children: 4   Years of education: 12   Highest education level: High school graduate  Occupational History   Not on file  Tobacco Use   Smoking status: Every Day    Packs/day: 0.25    Years: 45.00    Total pack years: 11.25    Types: Cigarettes   Smokeless tobacco: Never   Tobacco comments:    patient states she smokes 1 cigarette per month  Vaping Use   Vaping Use: Never used  Substance and Sexual Activity   Alcohol use: Not Currently    Comment: 07/12/2017 "stopped 2 yr ago; did have a drink over the holidays recently"   Drug use: No   Sexual activity: Not  Currently  Other Topics Concern   Not on file  Social History Narrative   Patient lives alone in [place].    Patient does not drive, she uses insurance transportation or her children.   Patient enjoys twirling her paton, listening to music, dancing, and spending time with family.    Patient enjoys celebrating all holidays and birthdays of loved ones.   Social Determinants of Health   Financial Resource Strain: Low Risk  (07/18/2019)   Overall Financial Resource Strain (CARDIA)    Difficulty of Paying Living Expenses: Not very hard  Food Insecurity: No Food Insecurity (03/21/2022)   Hunger Vital Sign    Worried About Running Out of Food in the Last Year: Never true    Ran Out of Food in the Last Year: Never true  Transportation Needs: No Transportation Needs (03/21/2022)   PRAPARE - Hydrologist (Medical): No    Lack of Transportation (Non-Medical): No  Physical Activity: Inactive (07/18/2019)   Exercise Vital Sign    Days of Exercise per Week: 0 days    Minutes of Exercise per Session: 0 min  Stress: No Stress Concern Present (07/18/2019)   [place]    Feeling of Stress : Only a little  Social Connections: Moderately Integrated (07/18/2019)   Social Connection and Isolation Panel [NHANES]    Frequency of Communication with Friends and Family: More than three times a week    Frequency of Social Gatherings with Friends and Family: More than three times a week    Attends Religious Services: More than 4 times per year    Active Member of [employer] or Organizations: Yes    Attends Archivist Meetings: 1 to 4 times per year    Marital Status: Divorced   Additional Social History:                         Sleep: Fair  Appetite:  Fair  Current Medications: Current Facility-Administered Medications  Medication Dose Route Frequency Provider Last Rate Last Admin    acetaminophen (TYLENOL) tablet 650 mg  650 mg Oral Q6H PRN Patrecia Pour, NP   650 mg at 03/27/22 0652   albuterol (VENTOLIN HFA) 108 (90 Base) MCG/ACT inhaler 2 puff  2 puff Inhalation Q6H PRN Noralee Space, RPH   2 puff at 03/26/22 2309   alum & mag  hydroxide-simeth (MAALOX/MYLANTA) 200-200-20 MG/5ML suspension 30 mL  30 mL Oral Q4H PRN Patrecia Pour, NP       amLODipine (NORVASC) tablet 5 mg  5 mg Oral Daily Patrecia Pour, NP   5 mg at 03/27/22 0809   apixaban (ELIQUIS) tablet 5 mg  5 mg Oral BID Patrecia Pour, NP   5 mg at 03/27/22 3662   aspirin EC tablet 81 mg  81 mg Oral Daily Patrecia Pour, NP   81 mg at 03/27/22 9476   benzonatate (TESSALON) capsule 200 mg  200 mg Oral TID PRN Patrecia Pour, NP       bismuth subsalicylate (PEPTO BISMOL) 262 MG/15ML suspension 30 mL  30 mL Oral Q8H PRN Patrecia Pour, NP   30 mL at 03/26/22 2309   dapagliflozin propanediol (FARXIGA) tablet 5 mg  5 mg Oral Daily Patrecia Pour, NP   5 mg at 03/27/22 5465   divalproex (DEPAKOTE) DR tablet 500 mg  500 mg Oral Q12H Patrecia Pour, NP   500 mg at 03/27/22 0354   insulin aspart (novoLOG) injection 0-9 Units  0-9 Units Subcutaneous TID WC Patrecia Pour, NP   2 Units at 03/27/22 1113   isosorbide mononitrate (IMDUR) 24 hr tablet 15 mg  15 mg Oral Daily Patrecia Pour, NP   15 mg at 03/27/22 6568   magnesium hydroxide (MILK OF MAGNESIA) suspension 30 mL  30 mL Oral Daily PRN Patrecia Pour, NP       metFORMIN (GLUCOPHAGE) tablet 1,000 mg  1,000 mg Oral BID WC Patrecia Pour, NP   1,000 mg at 03/27/22 1275   metoprolol succinate (TOPROL-XL) 24 hr tablet 50 mg  50 mg Oral Daily Patrecia Pour, NP   50 mg at 03/27/22 0809   nitroGLYCERIN (NITROSTAT) SL tablet 0.4 mg  0.4 mg Sublingual Q5 Min x 3 PRN Patrecia Pour, NP       Oral care mouth rinse  15 mL Mouth Rinse PRN Patrecia Pour, NP       paliperidone (INVEGA) 24 hr tablet 9 mg  9 mg Oral QHS Patrecia Pour, NP   9 mg at 03/26/22 2017    rosuvastatin (CRESTOR) tablet 40 mg  40 mg Oral Daily Patrecia Pour, NP   40 mg at 03/27/22 1700   ziprasidone (GEODON) injection 10 mg  10 mg Intramuscular Q6H PRN Patrecia Pour, NP        Lab Results:  Results for orders placed or performed during the hospital encounter of 03/21/22 (from the past 48 hour(s))  Glucose, capillary     Status: Abnormal   Collection Time: 03/25/22  4:16 PM  Result Value Ref Range   Glucose-Capillary 119 (H) 70 - 99 mg/dL    Comment: Glucose reference range applies only to samples taken after fasting for at least 8 hours.  Glucose, capillary     Status: Abnormal   Collection Time: 03/25/22  7:22 PM  Result Value Ref Range   Glucose-Capillary 59 (L) 70 - 99 mg/dL    Comment: Glucose reference range applies only to samples taken after fasting for at least 8 hours.  Glucose, capillary     Status: None   Collection Time: 03/25/22  9:50 PM  Result Value Ref Range   Glucose-Capillary 99 70 - 99 mg/dL    Comment: Glucose reference range applies only to samples taken after fasting for at least 8 hours.  Glucose, capillary     Status: Abnormal   Collection Time: 03/26/22 12:02 AM  Result Value Ref Range   Glucose-Capillary 150 (H) 70 - 99 mg/dL    Comment: Glucose reference range applies only to samples taken after fasting for at least 8 hours.  Glucose, capillary     Status: Abnormal   Collection Time: 03/26/22  7:34 AM  Result Value Ref Range   Glucose-Capillary 110 (H) 70 - 99 mg/dL    Comment: Glucose reference range applies only to samples taken after fasting for at least 8 hours.  Creatinine, serum     Status: Abnormal   Collection Time: 03/26/22  7:37 AM  Result Value Ref Range   Creatinine, Ser 1.24 (H) 0.44 - 1.00 mg/dL   GFR, Estimated 47 (L) >60 mL/min    Comment: (NOTE) Calculated using the CKD-EPI Creatinine Equation (2021) Performed at [facility], [address] [place],  [address]   Glucose, capillary     Status:  Abnormal   Collection Time: 03/26/22 11:46 AM  Result Value Ref Range   Glucose-Capillary 115 (H) 70 - 99 mg/dL    Comment: Glucose reference range applies only to samples taken after fasting for at least 8 hours.  Glucose, capillary     Status: Abnormal   Collection Time: 03/26/22  3:55 PM  Result Value Ref Range   Glucose-Capillary 117 (H) 70 - 99 mg/dL    Comment: Glucose reference range applies only to samples taken after fasting for at least 8 hours.  Glucose, capillary     Status: Abnormal   Collection Time: 03/26/22  8:06 PM  Result Value Ref Range   Glucose-Capillary 119 (H) 70 - 99 mg/dL    Comment: Glucose reference range applies only to samples taken after fasting for at least 8 hours.  Glucose, capillary     Status: Abnormal   Collection Time: 03/26/22 11:08 PM  Result Value Ref Range   Glucose-Capillary 179 (H) 70 - 99 mg/dL    Comment: Glucose reference range applies only to samples taken after fasting for at least 8 hours.  Glucose, capillary     Status: Abnormal   Collection Time: 03/27/22  7:16 AM  Result Value Ref Range   Glucose-Capillary 122 (H) 70 - 99 mg/dL    Comment: Glucose reference range applies only to samples taken after fasting for at least 8 hours.  Glucose, capillary     Status: Abnormal   Collection Time: 03/27/22 11:04 AM  Result Value Ref Range   Glucose-Capillary 181 (H) 70 - 99 mg/dL    Comment: Glucose reference range applies only to samples taken after fasting for at least 8 hours.   *Note: Due to a large number of results and/or encounters for the requested time period, some results have not been displayed. A complete set of results can be found in Results Review.    Blood Alcohol level:  Lab Results  Component Value Date   ETH <10 03/07/2022   ETH <10 82/50/5397    Metabolic Disorder Labs: Lab Results  Component Value Date   HGBA1C 9.7 (H) 03/07/2022   MPG 231.69 03/07/2022   MPG 192 04/19/2021   No results found for:  "PROLACTIN" Lab Results  Component Value Date   CHOL 215 (H) 03/08/2022   TRIG 139 03/08/2022   HDL 43 03/08/2022   CHOLHDL 5.0 03/08/2022   VLDL 28 03/08/2022   LDLCALC 144 (H) 03/08/2022   LDLCALC 80 04/19/2021  Physical Findings: AIMS:  , ,  ,  ,    CIWA:    COWS:     Musculoskeletal: Strength & Muscle Tone: within normal limits Gait & Station: normal Patient leans: N/A  Psychiatric Specialty Exam:  Presentation  General Appearance: Appropriate for Environment; Casual  Eye Contact:Good  Speech:Normal Rate  Speech Volume:Normal  Handedness:Right   Mood and Affect  Mood:Euthymic  Affect:Congruent; Appropriate   Thought Process  Thought Processes:Goal Directed; Disorganized  Descriptions of Associations:Tangential  Orientation:Full (Time, Place and Person)  Thought Content:Illogical; Paranoid Ideation; Perseveration  History of Schizophrenia/Schizoaffective disorder:Yes  Duration of Psychotic Symptoms:Greater than six months  Hallucinations:No data recorded Ideas of Reference:Paranoia  Suicidal Thoughts:No data recorded Homicidal Thoughts:No data recorded  Sensorium  Memory:Immediate Fair; Recent Fair  Judgment:Impaired  Insight:Lacking; Shallow   Executive Functions  Concentration:Fair  Attention Span:Poor  [place]   Psychomotor Activity  Psychomotor Activity:No data recorded  Assets  Assets:Financial Resources/Insurance; Desire for Improvement; Housing; Social Support   Sleep  Sleep:No data recorded   Physical Exam: Physical Exam Vitals and nursing note reviewed.  Constitutional:      Appearance: Normal appearance.  HENT:     Head: Normocephalic and atraumatic.     Mouth/Throat:     Pharynx: Oropharynx is clear.  Eyes:     Pupils: Pupils are equal, round, and reactive to light.  Cardiovascular:     Rate and Rhythm: Normal rate and regular rhythm.  Pulmonary:      Effort: Pulmonary effort is normal.     Breath sounds: Normal breath sounds.  Abdominal:     General: Abdomen is flat.     Palpations: Abdomen is soft.  Musculoskeletal:        General: Normal range of motion.  Skin:    General: Skin is warm and dry.  Neurological:     General: No focal deficit present.     Mental Status: She is alert. Mental status is at baseline.  Psychiatric:        Mood and Affect: Mood normal.        Thought Content: Thought content normal.    Review of Systems  Constitutional: Negative.   HENT: Negative.    Eyes: Negative.   Respiratory: Negative.    Cardiovascular: Negative.   Gastrointestinal: Negative.   Musculoskeletal: Negative.   Skin: Negative.   Neurological: Negative.   Psychiatric/Behavioral: Negative.     Blood pressure 123/87, pulse 92, temperature 97.7 F (36.5 C), temperature source Oral, resp. rate 18, height '5\' 5"'  (1.651 m), weight 69.9 kg, SpO2 100 %. Body mass index is 25.63 kg/m.   Treatment Plan Summary: Plan no change to medication.  Supportive therapy and encouragement to be out of bed moving around to get her limbs feeling stronger.  May be ready for discharge relatively soon  Alethia Berthold, MD 03/27/2022, 12:14 PM

## 2022-03-27 NOTE — [facility] Group Notes (Signed)
PsychoEducational Group- Patients were given two poems to read. One titled the [place], the second '' there's a hole in my sidewalk ''  Patients were asked to reminisce on times in their life in which they had a ''sharpening experience '' similar to the pencil , and what coping skills they learned and developed..  Pt shared and was appropriate.

## 2022-03-27 NOTE — [facility] Group Notes (Signed)
Pt attended recreation and music group outside in the courtyard. She danced and sang and participated.

## 2022-03-27 NOTE — Group Note (Signed)
[facility] LCSW Group Therapy Note   Group Date: 03/27/2022 Start Time: 1330 End Time: 1430   Type of Therapy and Topic: Group Therapy: Avoiding Self-Sabotaging and Enabling Behaviors  Participation Level: Did Not Attend  Mood:  Description of Group:  In this group, patients will learn how to identify obstacles, self-sabotaging and enabling behaviors, as well as: what are they, why do we do them and what needs these behaviors meet. Discuss unhealthy relationships and how to have positive healthy boundaries with those that sabotage and enable. Explore aspects of self-sabotage and enabling in yourself and how to limit these self-destructive behaviors in everyday life.   Therapeutic Goals: 1. Patient will identify one obstacle that relates to self-sabotage and enabling behaviors 2. Patient will identify one personal self-sabotaging or enabling behavior they did prior to admission 3. Patient will state a plan to change the above identified behavior 4. Patient will demonstrate ability to communicate their needs through discussion and/or role play.    Summary of Patient Progress: Patient did not engage in group, though was present during group discussion.    Therapeutic Modalities:  Cognitive Behavioral Therapy Person-Centered Therapy Motivational Interviewing    Rozann Lesches, LCSW

## 2022-03-27 NOTE — Plan of Care (Signed)

## 2022-03-28 DIAGNOSIS — F250 Schizoaffective disorder, bipolar type: Principal | ICD-10-CM

## 2022-03-28 LAB — GLUCOSE, CAPILLARY
Glucose-Capillary: 97 mg/dL (ref 70–99)
Glucose-Capillary: 99 mg/dL (ref 70–99)
Glucose-Capillary: 212 mg/dL — ABNORMAL HIGH (ref 70–99)
Glucose-Capillary: 125 mg/dL — ABNORMAL HIGH (ref 70–99)

## 2022-03-28 MED ORDER — DONEPEZIL HCL 5 MG PO TABS
5.0000 mg | ORAL_TABLET | Freq: Every day | ORAL | Status: DC
  Administered 2022-03-29 – 2022-04-03 (×5): 5 mg via ORAL
  Filled 2022-03-28 (×7): qty 1

## 2022-03-28 NOTE — Group Note (Signed)
[facility] LCSW Group Therapy Note    Group Date: 03/28/2022 Start Time: 1310 End Time: 1410  Type of Therapy and Topic:  Group Therapy:  Overcoming Obstacles  Participation Level:  [facility] PARTICIPATION LEVEL: None  Mood:  Description of Group:   In this group patients will be encouraged to explore what they see as obstacles to their own wellness and recovery. They will be guided to discuss their thoughts, feelings, and behaviors related to these obstacles. The group will process together ways to cope with barriers, with attention given to specific choices patients can make. Each patient will be challenged to identify changes they are motivated to make in order to overcome their obstacles. This group will be process-oriented, with patients participating in exploration of their own experiences as well as giving and receiving support and challenge from other group members.  Therapeutic Goals: 1. Patient will identify personal and current obstacles as they relate to admission. 2. Patient will identify barriers that currently interfere with their wellness or overcoming obstacles.  3. Patient will identify feelings, thought process and behaviors related to these barriers. 4. Patient will identify two changes they are willing to make to overcome these obstacles:    Summary of Patient Progress   Patient was present in group.  Patient was off topic in group.  Patient required a lot of redirections.  Patient was observed to be responding to internal stimuli, however, only when her eyes was closed.  Patient was swinging her arms as if fighting.   Therapeutic Modalities:   Cognitive Behavioral Therapy Solution Focused Therapy Motivational Interviewing Relapse Prevention Therapy   Rozann Lesches, LCSW

## 2022-03-28 NOTE — Progress Notes (Signed)
Patient is A+O x 3, at times patient will exhibit an orientation of 4. She denies SI/HI/AVH. Pain 0/10. She denies depression and anxiety. Patient worries about not being able to discharge. "I am taking my meds and I feel fine. Why can't you put in for me to go?" Patient is pleasant and medication compliant. Appetite good. Patient agrees to contract for safety. Q15 minute unit checks in place.

## 2022-03-28 NOTE — BH IP Treatment Plan (Signed)
Interdisciplinary Treatment and Diagnostic Plan Update  03/28/2022 Time of Session: 8:30AM [name] Long MRN: [id number]  Principal Diagnosis: Schizoaffective disorder, bipolar type ([place])  Secondary Diagnoses: Principal Problem:   Schizoaffective disorder, bipolar type ([place])   Current Medications:  Current Facility-Administered Medications  Medication Dose Route Frequency Provider Last Rate Last Admin   acetaminophen (TYLENOL) tablet 650 mg  650 mg Oral Q6H PRN Patrecia Pour, NP   650 mg at 03/27/22 2359   albuterol (VENTOLIN HFA) 108 (90 Base) MCG/ACT inhaler 2 puff  2 puff Inhalation Q6H PRN Noralee Space, RPH   2 puff at 03/26/22 2309   alum & mag hydroxide-simeth (MAALOX/MYLANTA) 200-200-20 MG/5ML suspension 30 mL  30 mL Oral Q4H PRN Patrecia Pour, NP       amLODipine (NORVASC) tablet 5 mg  5 mg Oral Daily Patrecia Pour, NP   5 mg at 03/28/22 1003   apixaban (ELIQUIS) tablet 5 mg  5 mg Oral BID Patrecia Pour, NP   5 mg at 03/28/22 1003   aspirin EC tablet 81 mg  81 mg Oral Daily Patrecia Pour, NP   81 mg at 03/28/22 1002   benzonatate (TESSALON) capsule 200 mg  200 mg Oral TID PRN Patrecia Pour, NP       bismuth subsalicylate (PEPTO BISMOL) 262 MG/15ML suspension 30 mL  30 mL Oral Q8H PRN Patrecia Pour, NP   30 mL at 03/27/22 2359   dapagliflozin propanediol (FARXIGA) tablet 5 mg  5 mg Oral Daily Patrecia Pour, NP   5 mg at 03/28/22 1003   divalproex (DEPAKOTE) DR tablet 500 mg  500 mg Oral Q12H Patrecia Pour, NP   500 mg at 03/28/22 1002   insulin aspart (novoLOG) injection 0-9 Units  0-9 Units Subcutaneous TID WC Patrecia Pour, NP   2 Units at 03/27/22 1617   isosorbide mononitrate (IMDUR) 24 hr tablet 15 mg  15 mg Oral Daily Patrecia Pour, NP   15 mg at 03/28/22 1003   magnesium hydroxide (MILK OF MAGNESIA) suspension 30 mL  30 mL Oral Daily PRN Patrecia Pour, NP       metFORMIN (GLUCOPHAGE) tablet 1,000 mg  1,000 mg Oral BID WC Patrecia Pour, NP    1,000 mg at 03/28/22 0756   metoprolol succinate (TOPROL-XL) 24 hr tablet 50 mg  50 mg Oral Daily Patrecia Pour, NP   50 mg at 03/28/22 1002   nitroGLYCERIN (NITROSTAT) SL tablet 0.4 mg  0.4 mg Sublingual Q5 Min x 3 PRN Patrecia Pour, NP       Oral care mouth rinse  15 mL Mouth Rinse PRN Patrecia Pour, NP       paliperidone (INVEGA) 24 hr tablet 9 mg  9 mg Oral QHS Patrecia Pour, NP   9 mg at 03/27/22 2137   rosuvastatin (CRESTOR) tablet 40 mg  40 mg Oral Daily Patrecia Pour, NP   40 mg at 03/28/22 1002   ziprasidone (GEODON) injection 10 mg  10 mg Intramuscular Q6H PRN Patrecia Pour, NP       PTA Medications: Medications Prior to Admission  Medication Sig Dispense Refill Last Dose   albuterol (VENTOLIN HFA) 108 (90 Base) MCG/ACT inhaler Inhale 2 puffs into the lungs 4 (four) times daily as needed for wheezing or shortness of breath. 18 g 1    amLODipine (NORVASC) 5 MG tablet Take 1 tablet (5 mg total)  by mouth daily.      apixaban (ELIQUIS) 5 MG TABS tablet Take 1 tablet (5 mg total) by mouth 2 (two) times daily. 60 tablet     aspirin EC 81 MG EC tablet Take 1 tablet (81 mg total) by mouth daily. Swallow whole. 30 tablet 11    Blood Glucose Monitoring Suppl (FREESTYLE LITE) w/Device KIT 1 each by Does not apply route daily. Dx:E11.9 1 kit 0    Blood Pressure Monitoring (BLOOD PRESSURE MONITOR/WRIST) KIT 1 Units by Does not apply route daily at 6 (six) AM. 1 kit 0    divalproex (DEPAKOTE SPRINKLE) 125 MG capsule Take 2 capsules (250 mg total) by mouth every 12 (twelve) hours.      FARXIGA 5 MG TABS tablet Take 5 mg by mouth daily.      fluticasone (FLONASE) 50 MCG/ACT nasal spray Place 2 sprays into both nostrils daily. 16 g 0    insulin aspart (NOVOLOG) 100 UNIT/ML FlexPen Before each meal 3 times a day, 140-199 - 2 units, 200-250 - 4 units, 251-299 - 6 units,  300-349 - 8 units,  350 or above 10 units. Insulin PEN if approved, provide syringes and needles if needed.Please switch to  any approved short acting Insulin if needed. 15 mL 0    isosorbide mononitrate (IMDUR) 30 MG 24 hr tablet Take 0.5 tablets (15 mg total) by mouth daily.      Lancets (FREESTYLE) lancets Use to check blood sugar once daily. Dx: E11.9 100 each 5    metFORMIN (GLUCOPHAGE) 1000 MG tablet Take 1 tablet (1,000 mg total) by mouth 2 (two) times daily with a meal. 60 tablet 2    metoprolol succinate (TOPROL-XL) 50 MG 24 hr tablet Take 1 tablet (50 mg total) by mouth daily. 30 tablet 11    nitroGLYCERIN (NITROSTAT) 0.4 MG SL tablet Place under the tongue as needed.      paliperidone (INVEGA) 6 MG 24 hr tablet Take 1 tablet (6 mg total) by mouth at bedtime.      pantoprazole (PROTONIX) 40 MG tablet Take 1 tablet (40 mg total) by mouth daily. 30 tablet 2    rosuvastatin (CRESTOR) 40 MG tablet Take 1 tablet (40 mg total) by mouth daily. 30 tablet 2     Patient Stressors: Health problems   Medication change or noncompliance    Patient Strengths: Ability for insight  Active sense of humor  Average or above average intelligence  Capable of independent living   Treatment Modalities: Medication Management, Group therapy, Case management,  1 to 1 session with clinician, Psychoeducation, Recreational therapy.   Physician Treatment Plan for Primary Diagnosis: Schizoaffective disorder, bipolar type ([place]) Long Term Goal(s): Improvement in symptoms so as ready for discharge   Short Term Goals: Ability to identify changes in lifestyle to reduce recurrence of condition will improve Ability to verbalize feelings will improve Ability to disclose and discuss suicidal ideas Ability to demonstrate self-control will improve Ability to identify and develop effective coping behaviors will improve Ability to maintain clinical measurements within normal limits will improve Compliance with prescribed medications will improve Ability to identify triggers associated with substance abuse/mental health issues will  improve  Medication Management: Evaluate patient's response, side effects, and tolerance of medication regimen.  Therapeutic Interventions: 1 to 1 sessions, Unit Group sessions and Medication administration.  Evaluation of Outcomes: Progressing  Physician Treatment Plan for Secondary Diagnosis: Principal Problem:   Schizoaffective disorder, bipolar type ([place])  Long Term Goal(s): Improvement in symptoms  so as ready for discharge   Short Term Goals: Ability to identify changes in lifestyle to reduce recurrence of condition will improve Ability to verbalize feelings will improve Ability to disclose and discuss suicidal ideas Ability to demonstrate self-control will improve Ability to identify and develop effective coping behaviors will improve Ability to maintain clinical measurements within normal limits will improve Compliance with prescribed medications will improve Ability to identify triggers associated with substance abuse/mental health issues will improve     Medication Management: Evaluate patient's response, side effects, and tolerance of medication regimen.  Therapeutic Interventions: 1 to 1 sessions, Unit Group sessions and Medication administration.  Evaluation of Outcomes: Progressing   RN Treatment Plan for Primary Diagnosis: Schizoaffective disorder, bipolar type ([place]) Long Term Goal(s): Knowledge of disease and therapeutic regimen to maintain health will improve  Short Term Goals: Ability to demonstrate self-control, Ability to participate in decision making will improve, Ability to verbalize feelings will improve, Ability to identify and develop effective coping behaviors will improve, and Compliance with prescribed medications will improve  Medication Management: RN will administer medications as ordered by provider, will assess and evaluate patient's response and provide education to patient for prescribed medication. RN will report any adverse and/or side effects to  prescribing provider.  Therapeutic Interventions: 1 on 1 counseling sessions, Psychoeducation, Medication administration, Evaluate responses to treatment, Monitor vital signs and CBGs as ordered, Perform/monitor CIWA, COWS, AIMS and Fall Risk screenings as ordered, Perform wound care treatments as ordered.  Evaluation of Outcomes: Progressing   LCSW Treatment Plan for Primary Diagnosis: Schizoaffective disorder, bipolar type ([place]) Long Term Goal(s): Safe transition to appropriate next level of care at discharge, Engage patient in therapeutic group addressing interpersonal concerns.  Short Term Goals: Engage patient in aftercare planning with referrals and resources, Increase social support, Increase ability to appropriately verbalize feelings, Increase emotional regulation, Facilitate acceptance of mental health diagnosis and concerns, and Increase skills for wellness and recovery  Therapeutic Interventions: Assess for all discharge needs, 1 to 1 time with Social worker, Explore available resources and support systems, Assess for adequacy in community support network, Educate family and significant other(s) on suicide prevention, Complete Psychosocial Assessment, Interpersonal group therapy.  Evaluation of Outcomes: Progressing   Progress in Treatment: Attending groups: Yes. Participating in groups: Yes. Taking medication as prescribed: Yes. Toleration medication: Yes. Family/Significant other contact made: Yes, individual(s) contacted:  SPE completed with the patient's daughter. Patient understands diagnosis: Yes. Discussing patient identified problems/goals with staff: Yes. Medical problems stabilized or resolved: Yes. Denies suicidal/homicidal ideation: Yes. Issues/concerns per patient self-inventory: No. Other: none  New problem(s) identified: No, Describe:  none Update 03/28/2022:  No changes at this time.   New Short Term/Long Term Goal(s): elimination of symptoms of psychosis,  medication management for mood stabilization; elimination of SI thoughts; development of comprehensive mental wellness plan. Update 03/28/2022:  No changes at this time.   Patient Goals:  "get an x-ray and a CAT scan"Update 03/28/2022:  No changes at this time.   Discharge Plan or Barriers: Patient reports plans to return to her home.  Patient reports that she is willing to begin ACTT services with [facility] at discharge.  CSW to follow up with guardian and continue to assist in development of appropriate discharge plans. Update 03/28/2022:  CSW has contacted [facility] to begin discharge planning.  CSW is awaiting a call back.   Reason for Continuation of Hospitalization: Anxiety Delusions  Depression Hallucinations Medication stabilization   Estimated Length of Stay:  1-7  days Update 03/28/2022:  TBD  Last 3 [place] Suicide Severity Risk Score: Flowsheet Row Admission (Current) from 03/21/2022 in [facility] to Hosp-Admission (Discharged) from 03/07/2022 in [facility] from 12/08/2021 in [facility] [place] No Risk No Risk No Risk       Last PHQ 2/9 Scores:    03/27/2020    2:37 PM 03/23/2020    2:02 PM 03/12/2020    2:36 PM  Depression screen PHQ 2/9  Decreased Interest 0 0 3  Down, Depressed, Hopeless 0 0 0  PHQ - 2 Score 0 0 3  Altered sleeping 0 3 1  Tired, decreased energy 0 0 0  Change in appetite 0 0 0  Feeling bad or failure about yourself  0 0 0  Trouble concentrating 0 3 1  Moving slowly or fidgety/restless 0 0 0  Suicidal thoughts 0 0 0  PHQ-9 Score 0 6 5    Scribe for Treatment Team: Rozann Lesches, LCSW 03/28/2022 10:26 AM

## 2022-03-28 NOTE — [facility] Counselor (Signed)
CSW contacted [facility] in an effort to set up aftercare.  CSW left HIPAA compliant voicemail and waiting a return call.  [name], MSW, LCSW 03/28/2022 10:25 AM
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
## 2022-03-28 NOTE — Progress Notes (Signed)
   03/28/22 0400  Psych Admission Type (Psych Patients Only)  Admission Status Involuntary  Psychosocial Assessment  Patient Complaints Anxiety  Eye Contact Fair  Facial Expression Animated  Affect Appropriate to circumstance;Labile  Speech Tangential  Interaction Attention-seeking;Dominating  Motor Activity Restless  Appearance/Hygiene Improved  Behavior Characteristics Cooperative;Appropriate to situation  Mood Labile;Suspicious  Thought Process  Coherency Disorganized;Circumstantial  Content Paranoia  Delusions None reported or observed  Perception UTA  Hallucination None reported or observed  Judgment Poor  Confusion Mild  Danger to Self  Current suicidal ideation? Denies  Agreement Not to Harm Self Yes  Description of Agreement verbal  Danger to Others  Danger to Others None reported or observed

## 2022-03-28 NOTE — Plan of Care (Signed)
  Problem: Education: Goal: Knowledge of General Education information will improve Description: Including pain rating scale, medication(s)/side effects and non-pharmacologic comfort measures Outcome: Progressing   Problem: Health Behavior/Discharge Planning: Goal: Ability to manage health-related needs will improve Outcome: Progressing   Problem: Clinical Measurements: Goal: Respiratory complications will improve Outcome: Progressing Goal: Cardiovascular complication will be avoided Outcome: Progressing   Problem: Coping: Goal: Level of anxiety will decrease Outcome: Progressing   Problem: Safety: Goal: Ability to remain free from injury will improve Outcome: Progressing   Problem: Cardiac: Goal: Ability to achieve and maintain adequate cardiovascular perfusion will improve Outcome: Progressing

## 2022-03-28 NOTE — B[facility] Group Notes (Signed)
Patient attended group therapy in the afternoon but was unable to attend recreational therapy held in the courtyard in the evening.

## 2022-03-29 LAB — GLUCOSE, CAPILLARY
Glucose-Capillary: 156 mg/dL — ABNORMAL HIGH (ref 70–99)
Glucose-Capillary: 135 mg/dL — ABNORMAL HIGH (ref 70–99)
Glucose-Capillary: 145 mg/dL — ABNORMAL HIGH (ref 70–99)
Glucose-Capillary: 154 mg/dL — ABNORMAL HIGH (ref 70–99)

## 2022-03-29 NOTE — Group Note (Signed)
[facility] LCSW Group Therapy Note   Group Date: 03/29/2022 Start Time: 1330 End Time: 1430  Type of Therapy/Topic:  Group Therapy:  Feelings about Diagnosis  Participation Level:  Minimal   Description of Group:    This group will allow patients to explore their thoughts and feelings about diagnoses they have received. Patients will be guided to explore their level of understanding and acceptance of these diagnoses. Facilitator will encourage patients to process their thoughts and feelings about the reactions of others to their diagnosis, and will guide patients in identifying ways to discuss their diagnosis with significant others in their lives. This group will be process-oriented, with patients participating in exploration of their own experiences as well as giving and receiving support and challenge from other group members.   Therapeutic Goals: 1. Patient will demonstrate understanding of diagnosis as evidence by identifying two or more symptoms of the disorder:  2. Patient will be able to express two feelings regarding the diagnosis 3. Patient will demonstrate ability to communicate their needs through discussion and/or role plays  Summary of Patient Progress: Patient was present in group.  Patient was distracted and off topic, however, could be redirected.   Therapeutic Modalities:   Cognitive Behavioral Therapy Brief Therapy Feelings Identification    Rozann Lesches, LCSW

## 2022-03-29 NOTE — BH Assessment (Signed)
0730 Patient alert and oriented x 3 she is irritable this am. Denying SI/HI, hallucinations and depression. Will continue to monitor patient for safety.   1000 Patient has a lot of question about her medications she states that she does not need any medication for her mind. She is sociable despite being irritable. Will continue to monitor patient for safety.

## 2022-03-29 NOTE — Progress Notes (Signed)
Patient is anxious and cooperative. VS are stable. Compliant to medications. C/o headache and Tylenol was administered as prescribed. Pain resolved. Denies SI/ HI/AVH.[name] Long Communicates her needs very well. Speech is clear. Patient is able to walk independently. Patient ate her snacks,took her medications, and went to bed. Patient slept on her bed most of the night with eyes closed. Q 15 minutes check were maintained throughput the shift. No other issues.
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
## 2022-03-30 LAB — GLUCOSE, CAPILLARY
Glucose-Capillary: 99 mg/dL (ref 70–99)
Glucose-Capillary: 132 mg/dL — ABNORMAL HIGH (ref 70–99)
Glucose-Capillary: 193 mg/dL — ABNORMAL HIGH (ref 70–99)
Glucose-Capillary: 83 mg/dL (ref 70–99)

## 2022-03-30 MED ORDER — PALIPERIDONE PALMITATE ER 156 MG/ML IM SUSY
156.0000 mg | PREFILLED_SYRINGE | Freq: Once | INTRAMUSCULAR | Status: AC
  Administered 2022-03-30: 156 mg via INTRAMUSCULAR
  Filled 2022-03-30: qty 1

## 2022-03-30 MED ORDER — PALIPERIDONE ER 3 MG PO TB24
6.0000 mg | ORAL_TABLET | Freq: Every day | ORAL | Status: DC
  Administered 2022-03-30 – 2022-04-03 (×5): 6 mg via ORAL
  Filled 2022-03-30 (×5): qty 2

## 2022-03-30 MED ORDER — GABAPENTIN 100 MG PO CAPS
200.0000 mg | ORAL_CAPSULE | Freq: Three times a day (TID) | ORAL | Status: DC
  Administered 2022-03-30 – 2022-04-01 (×9): 200 mg via ORAL
  Filled 2022-03-30 (×10): qty 2

## 2022-03-30 NOTE — Progress Notes (Signed)
Patient presents with pleasant mood, tangential speech at times but improved from previous shifts with this patient. [name] Long reports she feels she is doing better, states '' my mind is better now. '' Patient states her goal is to '' go home today '' Patient states '' my daughter is talking about moving me in to her place, or maybe I can move her into a room at my house. I just know I am ready to go. And my back and knees are acting up because of the rain today . ''  Patient given prn for pain, and also reported indigestion for which pepto bismol was given with good results.  Patient denies any SI or HI or A/V Hallucinations. Pt has been attending programming and meals and appropriate with peers. Pt compliant with am medications. Above discussed in treatment team meeting with provider. Pt is safe, will con't to monitor.

## 2022-03-30 NOTE — [facility] Counselor (Signed)
CSW spoke with the patient's guardian Kiele Heavrin.  She provided the following as her new number [phone].  She reports that she is aligned with a 04/04/2022 discharge.  She requests that physician stress to her that she must make her appointments after discharge.   She clarifies that patient lives in her own home and she checks on her regularly.    [name], MSW, LCSW 03/30/2022 2:17 PM

## 2022-03-30 NOTE — Progress Notes (Signed)
Pt is Aox4. Denies SI/HI/AVH. Vital signs are stable. Denies pain. Compliant to medications. Patient requested Pepto and took for indigestion. Medication was effective on reassessment. Calm and cooperative. Lethargic. Petite is good Pt ate snacks. Communicates well with peers and staff very well.Patient went to bed after she took her night medications and slept most of the night on her bed with eyes closed.  Able to do ADL independently. No other issues.
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
## 2022-03-30 NOTE — Group Note (Signed)
[facility] LCSW Group Therapy Note   Group Date: 03/30/2022 Start Time: 1300 End Time: 1400   Type of Therapy/Topic:  Group Therapy:  Emotion Regulation  Participation Level:  Did Not Attend   Mood:  Description of Group:    The purpose of this group is to assist patients in learning to regulate negative emotions and experience positive emotions. Patients will be guided to discuss ways in which they have been vulnerable to their negative emotions. These vulnerabilities will be juxtaposed with experiences of positive emotions or situations, and patients challenged to use positive emotions to combat negative ones. Special emphasis will be placed on coping with negative emotions in conflict situations, and patients will process healthy conflict resolution skills.  Therapeutic Goals: Patient will identify two positive emotions or experiences to reflect on in order to balance out negative emotions:  Patient will label two or more emotions that they find the most difficult to experience:  Patient will be able to demonstrate positive conflict resolution skills through discussion or role plays:   Summary of Patient Progress: Patient was present in group.  Patient attempted to engage in group discussions.  However, patient displayed loose connections and was unable to engage appropriately.  Patient was off topic and discussing how her sisters are allegedly using substances, and how she has a robot that looks like her.  Therapeutic Modalities:   Cognitive Behavioral Therapy Feelings Identification Dialectical Behavioral Therapy   Rozann Lesches, LCSW

## 2022-03-30 NOTE — Plan of Care (Signed)

## 2022-03-30 NOTE — BHH Counselor (Signed)
CSW again attempted to call [facility] and schedule appointment.    Daughter is aligned with a regular outpatient appointment, following no communication from the [facility] team.  [name], MSW, LCSW 03/30/2022 2:18 PM

## 2022-03-30 NOTE — BHH Counselor (Signed)
CSW has attempted to contact [facility] again for aftercare appointment.  [name] Long is the requested provider from both patient and legal guardian.  CSW attempted to contact [name] Long, legal guardian/daughter, [phone], [phone], [phone].  CSW was unable to speak with the daughter and left HIPAA compliant voicemail requesting a return call.   [name], MSW, LCSW 03/30/2022 8:58 AM

## 2022-03-31 LAB — GLUCOSE, CAPILLARY
Glucose-Capillary: 97 mg/dL (ref 70–99)
Glucose-Capillary: 116 mg/dL — ABNORMAL HIGH (ref 70–99)
Glucose-Capillary: 186 mg/dL — ABNORMAL HIGH (ref 70–99)
Glucose-Capillary: 135 mg/dL — ABNORMAL HIGH (ref 70–99)
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
## 2022-03-31 NOTE — BHH Counselor (Signed)
CSW has sent email to [facility] in an effort to make referral for an ACTT team.  CSW still has not received any communication back about how to make a referral for ACTT services.  [name], MSW, LCSW 03/31/2022 10:10 AM

## 2022-03-31 NOTE — Progress Notes (Signed)
Patient presents with pleasant mood, tangential speech at times but improved from previous shifts with this patient. Dandria reports she feels she is doing fine, and ready to go home, she states '' man I hjad to go to bed early last night, I was just tired, but I've woken up ready this morning. And my sugar is doing much better so I'm happy I'll be able to drink a soda at breakfast today. '' Patient continues to report chronic pain, however states she feels it has improved with start of new medication. Patient also reported indigestion at times, but requested ginger-ale for it.  Patient denies any SI or HI or A/V Hallucinations. Pt has been attending programming and meals and appropriate with peers. Pt compliant with am medications. Above discussed in treatment team meeting with provider. Pt is safe, will continue to monitor.

## 2022-03-31 NOTE — BHH Group Notes (Signed)
Pt attended recreation group and karaoke. Patient danced and attended appropriately.

## 2022-03-31 NOTE — Group Note (Signed)
[facility] LCSW Group Therapy Note   Group Date: 03/31/2022 Start Time: 1310 End Time: 1400   Type of Therapy/Topic:  Group Therapy:  Balance in Life  Participation Level:  Minimal   Description of Group:    This group will address the concept of balance and how it feels and looks when one is unbalanced. Patients will be encouraged to process areas in their lives that are out of balance, and identify reasons for remaining unbalanced. Facilitators will guide patients utilizing problem- solving interventions to address and correct the stressor making their life unbalanced. Understanding and applying boundaries will be explored and addressed for obtaining  and maintaining a balanced life. Patients will be encouraged to explore ways to assertively make their unbalanced needs known to significant others in their lives, using other group members and facilitator for support and feedback.  Therapeutic Goals: Patient will identify two or more emotions or situations they have that consume much of in their lives. Patient will identify signs/triggers that life has become out of balance:  Patient will identify two ways to set boundaries in order to achieve balance in their lives:  Patient will demonstrate ability to communicate their needs through discussion and/or role plays  Summary of Patient Progress:    Patient was present in group.  Patient often participated in group discussion though was off topic.  CSW observed patient to close her eyes and then began to swat at the air as if she is hitting something or someone, but there was nothing there.  Therapeutic Modalities:   Cognitive Behavioral Therapy Solution-Focused Therapy Assertiveness Training   Rozann Lesches, LCSW

## 2022-03-31 NOTE — Progress Notes (Signed)
Pt reports she has had loose stools yesterday and today, reports she feels pepto minimally effective. Prn pepto given at this time and message sent to provider to update.

## 2022-04-01 ENCOUNTER — Other Ambulatory Visit: Payer: Self-pay

## 2022-04-01 LAB — GLUCOSE, CAPILLARY
Glucose-Capillary: 109 mg/dL — ABNORMAL HIGH (ref 70–99)
Glucose-Capillary: 135 mg/dL — ABNORMAL HIGH (ref 70–99)
Glucose-Capillary: 173 mg/dL — ABNORMAL HIGH (ref 70–99)
Glucose-Capillary: 111 mg/dL — ABNORMAL HIGH (ref 70–99)

## 2022-04-01 NOTE — BHH Counselor (Signed)
CSW has faxed the patient's [facility] ACTT team referral.  [name], MSW, LCSW 04/01/2022 12:42 PM

## 2022-04-01 NOTE — Progress Notes (Signed)
Consulted by Dr. Louis Meckel regarding [name]. Patient experienced a brief episode of chest pain earlier. Chest pain has since resolved. Dr. Louis Meckel suspects likely costochondritis. EKG obtained at the time demonstrated atrial fibrillation with rate of 67. No ST or T wave changes. Recent admission for NSTEMI from 9/4 - 03/21/2022; evaluated by Dr.Kadakia and medical management recommended.   Given CP has resolved and EKG is re-assuring, will hold off on consulting at this time. Dr. Louis Meckel expressed agreement with plan. If chest pain should re-occur, recommend EKG with troponin. Please re-consult if needed.   Signed,  Dr. Jose Persia [facility]   04/01/2022, 2:36 PM

## 2022-04-01 NOTE — BHH Group Notes (Signed)
Patient attended group today.

## 2022-04-01 NOTE — Progress Notes (Signed)
Patient was cooperative with treatment, she denies SI, HI & AVH. She was visible in the milieu during the evening. She was up and down through out the night.

## 2022-04-01 NOTE — Plan of Care (Signed)
  Problem: Education: Goal: Knowledge of General Education information will improve Description: Including pain rating scale, medication(s)/side effects and non-pharmacologic comfort measures Outcome: Progressing   Problem: Health Behavior/Discharge Planning: Goal: Ability to manage health-related needs will improve Outcome: Progressing   Problem: Clinical Measurements: Goal: Cardiovascular complication will be avoided Outcome: Progressing   Problem: Nutrition: Goal: Adequate nutrition will be maintained Outcome: Progressing   Problem: Coping: Goal: Level of anxiety will decrease Outcome: Progressing   Problem: Elimination: Goal: Will not experience complications related to bowel motility Outcome: Progressing   Problem: Pain Managment: Goal: General experience of comfort will improve Outcome: Progressing   Problem: Safety: Goal: Ability to remain free from injury will improve Outcome: Progressing

## 2022-04-01 NOTE — Group Note (Signed)
[facility] LCSW Group Therapy Note   Group Date: 04/01/2022 Start Time: 1310 End Time: 1400   Type of Therapy and Topic: Group Therapy: Avoiding Self-Sabotaging and Enabling Behaviors  Participation Level: None  Mood:  Description of Group:  In this group, patients will learn how to identify obstacles, self-sabotaging and enabling behaviors, as well as: what are they, why do we do them and what needs these behaviors meet. Discuss unhealthy relationships and how to have positive healthy boundaries with those that sabotage and enable. Explore aspects of self-sabotage and enabling in yourself and how to limit these self-destructive behaviors in everyday life.   Therapeutic Goals: 1. Patient will identify one obstacle that relates to self-sabotage and enabling behaviors 2. Patient will identify one personal self-sabotaging or enabling behavior they did prior to admission 3. Patient will state a plan to change the above identified behavior 4. Patient will demonstrate ability to communicate their needs through discussion and/or role play.    Summary of Patient Progress: Patient arrived to group late stating that she was late because "I was having a hear attack".  Patient participated in discussion, though was off topic in group and wanted to discuss her medications.   Therapeutic Modalities:  Cognitive Behavioral Therapy Person-Centered Therapy Motivational Interviewing    Rozann Lesches, LCSW
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
## 2022-04-01 NOTE — Progress Notes (Signed)
Patient is A+O x 3. She denies SI/HI/AVH. She denies anxiety and depression. Appetite good. Pain 9/10 for left shoulder pain. PRN Tylenol received. PRN Maalox adm for indigestion. Both PRN's tolerated well. Q15 minute unit checks in place.  At approx 1215, patient complained of chest pain. Vitals WNL. EKG ordered and performed. Please refer to the Dr. Noni Saupe note on the findings. Patient has an order for a follow up EKG and lab work tomorrow.

## 2022-04-02 LAB — GLUCOSE, CAPILLARY
Glucose-Capillary: 113 mg/dL — ABNORMAL HIGH (ref 70–99)
Glucose-Capillary: 124 mg/dL — ABNORMAL HIGH (ref 70–99)
Glucose-Capillary: 147 mg/dL — ABNORMAL HIGH (ref 70–99)
Glucose-Capillary: 102 mg/dL — ABNORMAL HIGH (ref 70–99)

## 2022-04-02 LAB — CKMB (ARMC ONLY): CK, MB: 1.6 ng/mL (ref 0.5–5.0)

## 2022-04-02 MED ORDER — IBUPROFEN 200 MG PO TABS
400.0000 mg | ORAL_TABLET | Freq: Four times a day (QID) | ORAL | Status: DC | PRN
  Administered 2022-04-02 – 2022-04-04 (×4): 400 mg via ORAL
  Filled 2022-04-02 (×4): qty 2

## 2022-04-02 NOTE — BH IP Treatment Plan (Signed)
Interdisciplinary Treatment and Diagnostic Plan Update  04/02/2022 Time of Session: 10:05am [name] Long MRN: [id number]  Principal Diagnosis: Schizoaffective disorder, bipolar type [facility])  Secondary Diagnoses: Principal Problem:   Schizoaffective disorder, bipolar type ([place])   Current Medications:  Current Facility-Administered Medications  Medication Dose Route Frequency Provider Last Rate Last Admin   acetaminophen (TYLENOL) tablet 650 mg  650 mg Oral Q6H PRN Patrecia Pour, NP   650 mg at 04/01/22 2355   albuterol (VENTOLIN HFA) 108 (90 Base) MCG/ACT inhaler 2 puff  2 puff Inhalation Q6H PRN Noralee Space, RPH   2 puff at 04/02/22 0936   alum & mag hydroxide-simeth (MAALOX/MYLANTA) 200-200-20 MG/5ML suspension 30 mL  30 mL Oral Q4H PRN Patrecia Pour, NP   30 mL at 04/01/22 1143   amLODipine (NORVASC) tablet 5 mg  5 mg Oral Daily Patrecia Pour, NP   5 mg at 04/02/22 0920   apixaban (ELIQUIS) tablet 5 mg  5 mg Oral BID Patrecia Pour, NP   5 mg at 04/02/22 0919   aspirin EC tablet 81 mg  81 mg Oral Daily Patrecia Pour, NP   81 mg at 04/02/22 0920   benzonatate (TESSALON) capsule 200 mg  200 mg Oral TID PRN Patrecia Pour, NP       bismuth subsalicylate (PEPTO BISMOL) 262 MG/15ML suspension 30 mL  30 mL Oral Q8H PRN Patrecia Pour, NP   30 mL at 04/01/22 0603   dapagliflozin propanediol (FARXIGA) tablet 5 mg  5 mg Oral Daily Patrecia Pour, NP   5 mg at 04/02/22 0919   divalproex (DEPAKOTE) DR tablet 500 mg  500 mg Oral Q12H Patrecia Pour, NP   500 mg at 04/02/22 0919   donepezil (ARICEPT) tablet 5 mg  5 mg Oral QHS Parks Ranger, DO   5 mg at 04/01/22 2129   gabapentin (NEURONTIN) capsule 200 mg  200 mg Oral TID Parks Ranger, DO   200 mg at 04/01/22 2129   insulin aspart (novoLOG) injection 0-9 Units  0-9 Units Subcutaneous TID WC Patrecia Pour, NP   2 Units at 04/01/22 1655   isosorbide mononitrate (IMDUR) 24 hr tablet 15 mg  15 mg Oral Daily  Patrecia Pour, NP   15 mg at 04/02/22 6387   magnesium hydroxide (MILK OF MAGNESIA) suspension 30 mL  30 mL Oral Daily PRN Patrecia Pour, NP       metFORMIN (GLUCOPHAGE) tablet 1,000 mg  1,000 mg Oral BID WC Patrecia Pour, NP   1,000 mg at 04/02/22 0820   metoprolol succinate (TOPROL-XL) 24 hr tablet 50 mg  50 mg Oral Daily Patrecia Pour, NP   50 mg at 04/02/22 0920   nitroGLYCERIN (NITROSTAT) SL tablet 0.4 mg  0.4 mg Sublingual Q5 Min x 3 PRN Patrecia Pour, NP       Oral care mouth rinse  15 mL Mouth Rinse PRN Patrecia Pour, NP       paliperidone (INVEGA) 24 hr tablet 6 mg  6 mg Oral QHS Parks Ranger, DO   6 mg at 04/01/22 2129   rosuvastatin (CRESTOR) tablet 40 mg  40 mg Oral Daily Patrecia Pour, NP   40 mg at 04/02/22 0919   ziprasidone (GEODON) injection 10 mg  10 mg Intramuscular Q6H PRN Patrecia Pour, NP       PTA Medications: Medications Prior to Admission  Medication Sig  Dispense Refill Last Dose   albuterol (VENTOLIN HFA) 108 (90 Base) MCG/ACT inhaler Inhale 2 puffs into the lungs 4 (four) times daily as needed for wheezing or shortness of breath. 18 g 1    amLODipine (NORVASC) 5 MG tablet Take 1 tablet (5 mg total) by mouth daily.      apixaban (ELIQUIS) 5 MG TABS tablet Take 1 tablet (5 mg total) by mouth 2 (two) times daily. 60 tablet     aspirin EC 81 MG EC tablet Take 1 tablet (81 mg total) by mouth daily. Swallow whole. 30 tablet 11    Blood Glucose Monitoring Suppl (FREESTYLE LITE) w/Device KIT 1 each by Does not apply route daily. Dx:E11.9 1 kit 0    Blood Pressure Monitoring (BLOOD PRESSURE MONITOR/WRIST) KIT 1 Units by Does not apply route daily at 6 (six) AM. 1 kit 0    divalproex (DEPAKOTE SPRINKLE) 125 MG capsule Take 2 capsules (250 mg total) by mouth every 12 (twelve) hours.      FARXIGA 5 MG TABS tablet Take 5 mg by mouth daily.      fluticasone (FLONASE) 50 MCG/ACT nasal spray Place 2 sprays into both nostrils daily. 16 g 0    insulin aspart  (NOVOLOG) 100 UNIT/ML FlexPen Before each meal 3 times a day, 140-199 - 2 units, 200-250 - 4 units, 251-299 - 6 units,  300-349 - 8 units,  350 or above 10 units. Insulin PEN if approved, provide syringes and needles if needed.Please switch to any approved short acting Insulin if needed. 15 mL 0    isosorbide mononitrate (IMDUR) 30 MG 24 hr tablet Take 0.5 tablets (15 mg total) by mouth daily.      Lancets (FREESTYLE) lancets Use to check blood sugar once daily. Dx: E11.9 100 each 5    metFORMIN (GLUCOPHAGE) 1000 MG tablet Take 1 tablet (1,000 mg total) by mouth 2 (two) times daily with a meal. 60 tablet 2    metoprolol succinate (TOPROL-XL) 50 MG 24 hr tablet Take 1 tablet (50 mg total) by mouth daily. 30 tablet 11    nitroGLYCERIN (NITROSTAT) 0.4 MG SL tablet Place under the tongue as needed.      paliperidone (INVEGA) 6 MG 24 hr tablet Take 1 tablet (6 mg total) by mouth at bedtime.      pantoprazole (PROTONIX) 40 MG tablet Take 1 tablet (40 mg total) by mouth daily. 30 tablet 2    rosuvastatin (CRESTOR) 40 MG tablet Take 1 tablet (40 mg total) by mouth daily. 30 tablet 2     Patient Stressors: Health problems   Medication change or noncompliance    Patient Strengths: Ability for insight  Active sense of humor  Average or above average intelligence  Capable of independent living   Treatment Modalities: Medication Management, Group therapy, Case management,  1 to 1 session with clinician, Psychoeducation, Recreational therapy.   Physician Treatment Plan for Primary Diagnosis: Schizoaffective disorder, bipolar type ([place]) Long Term Goal(s): Improvement in symptoms so as ready for discharge   Short Term Goals: Ability to identify changes in lifestyle to reduce recurrence of condition will improve Ability to verbalize feelings will improve Ability to disclose and discuss suicidal ideas Ability to demonstrate self-control will improve Ability to identify and develop effective coping  behaviors will improve Ability to maintain clinical measurements within normal limits will improve Compliance with prescribed medications will improve Ability to identify triggers associated with substance abuse/mental health issues will improve  Medication Management: Evaluate patient's  response, side effects, and tolerance of medication regimen.  Therapeutic Interventions: 1 to 1 sessions, Unit Group sessions and Medication administration.  Evaluation of Outcomes: Progressing  Physician Treatment Plan for Secondary Diagnosis: Principal Problem:   Schizoaffective disorder, bipolar type ([place])  Long Term Goal(s): Improvement in symptoms so as ready for discharge   Short Term Goals: Ability to identify changes in lifestyle to reduce recurrence of condition will improve Ability to verbalize feelings will improve Ability to disclose and discuss suicidal ideas Ability to demonstrate self-control will improve Ability to identify and develop effective coping behaviors will improve Ability to maintain clinical measurements within normal limits will improve Compliance with prescribed medications will improve Ability to identify triggers associated with substance abuse/mental health issues will improve     Medication Management: Evaluate patient's response, side effects, and tolerance of medication regimen.  Therapeutic Interventions: 1 to 1 sessions, Unit Group sessions and Medication administration.  Evaluation of Outcomes: Progressing   RN Treatment Plan for Primary Diagnosis: Schizoaffective disorder, bipolar type ([place]) Long Term Goal(s): Knowledge of disease and therapeutic regimen to maintain health will improve  Short Term Goals: Ability to remain free from injury will improve, Ability to verbalize frustration and anger appropriately will improve, Ability to demonstrate self-control, Ability to participate in decision making will improve, Ability to verbalize feelings will improve,  Ability to identify and develop effective coping behaviors will improve, and Compliance with prescribed medications will improve  Medication Management: RN will administer medications as ordered by provider, will assess and evaluate patient's response and provide education to patient for prescribed medication. RN will report any adverse and/or side effects to prescribing provider.  Therapeutic Interventions: 1 on 1 counseling sessions, Psychoeducation, Medication administration, Evaluate responses to treatment, Monitor vital signs and CBGs as ordered, Perform/monitor CIWA, COWS, AIMS and Fall Risk screenings as ordered, Perform wound care treatments as ordered.  Evaluation of Outcomes: Progressing   LCSW Treatment Plan for Primary Diagnosis: Schizoaffective disorder, bipolar type ([place]) Long Term Goal(s): Safe transition to appropriate next level of care at discharge, Engage patient in therapeutic group addressing interpersonal concerns.  Short Term Goals: Engage patient in aftercare planning with referrals and resources, Increase social support, Increase ability to appropriately verbalize feelings, Increase emotional regulation, and Increase skills for wellness and recovery  Therapeutic Interventions: Assess for all discharge needs, 1 to 1 time with Social worker, Explore available resources and support systems, Assess for adequacy in community support network, Educate family and significant other(s) on suicide prevention, Complete Psychosocial Assessment, Interpersonal group therapy.  Evaluation of Outcomes: Progressing   Progress in Treatment: Attending groups: Yes. Participating in groups: Yes. Taking medication as prescribed: Yes. Toleration medication: Yes. Family/Significant other contact made: Yes, individual(s) contacted:  SPE completed with the patient's daughter. Patient understands diagnosis: Yes. Discussing patient identified problems/goals with staff: Yes. Medical problems  stabilized or resolved: Yes. Denies suicidal/homicidal ideation: Yes. Issues/concerns per patient self-inventory: No. Other: none   New problem(s) identified: No, Describe:  none Update 03/28/2022:  No changes at this time. Update 04/02/2022: No changes at this time.    New Short Term/Long Term Goal(s): elimination of symptoms of psychosis, medication management for mood stabilization; elimination of SI thoughts; development of comprehensive mental wellness plan. Update 03/28/2022:  No changes at this time. Update 04/02/2022: No changes at this time.    Patient Goals:  "get an x-ray and a CAT scan"Update 03/28/2022:  No changes at this time. Update 04/02/2022: No changes at this time.    Discharge  Plan or Barriers: Patient reports plans to return to her home.  Patient reports that she is willing to begin ACTT services with [facility] at discharge.  CSW to follow up with guardian and continue to assist in development of appropriate discharge plans. Update 03/28/2022:  CSW has contacted [facility] to begin discharge planning.  CSW is awaiting a call back. Update 04/02/2022: Has an appointment with [facility] for ACTT intake scheduled on 10/9. Patients guardian is aware and agreeable. Plan for patient to discharge to legal guardian on Monday.    Reason for Continuation of Hospitalization: Anxiety Delusions  Depression Hallucinations Medication stabilization   Estimated Length of Stay:  1-7 days Update 03/28/2022:  TBD  Update 04/02/2022: 1-3 days  Last 3 [place] Suicide Severity Risk Score: Flowsheet Row Admission (Current) from 03/21/2022 in [place] ED to Hosp-Admission (Discharged) from 03/07/2022 in [facility] from 12/08/2021 in [facility] [place] No Risk No Risk No Risk       Last PHQ 2/9 Scores:    03/27/2020    2:37 PM 03/23/2020    2:02 PM 03/12/2020    2:36 PM  Depression screen PHQ 2/9  Decreased Interest 0 0 3   Down, Depressed, Hopeless 0 0 0  PHQ - 2 Score 0 0 3  Altered sleeping 0 3 1  Tired, decreased energy 0 0 0  Change in appetite 0 0 0  Feeling bad or failure about yourself  0 0 0  Trouble concentrating 0 3 1  Moving slowly or fidgety/restless 0 0 0  Suicidal thoughts 0 0 0  PHQ-9 Score 0 6 5    Scribe for Treatment Team: Vassie Moselle, LCSW 04/02/2022 10:09 AM
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
## 2022-04-02 NOTE — Progress Notes (Signed)
Patient is A+O x 3. She presents with mild anxiety about her discharge date. She denies SI/HI/AVH and depression. Around 1400, patient stated she had 8/10 generalized pain. Patient medicated with new prn Ibuprofen 400 mg. "The Ibuprofen I take at home are capsules." Patient was shown packaging and educated. She verbalized understanding. PRN Albuterol neb administered. During dinner time patient complained of 8/10 headache pain. PRN Tylenol adm. Q15 minute unit checks in place.

## 2022-04-02 NOTE — Progress Notes (Signed)
EKG performed as per MD order.

## 2022-04-02 NOTE — BH Assessment (Signed)
1910 Received patient sitting in the day room with fellow patients. She is alert and oriented x 3, denying that she is hearing voices or seeing things that is not seen by others. She is still in denial regarding her cognitive decline. She responds no to depress but reports that she is worried that her sisters will take the remainder of her inheritance.   1950 Patient at the nurses station asking for her clothes out of storage It was explained to patient that her that she can have on the unit was given to her on admission. She seemed to be puzzled about this answer and it was explained to patient again as a safety precaution and she appeared to have accepted the information somewhat better. Will continue t to monitor patient for safety.  2345 Patient complained of bil knee pain. It was noted that the patient had very swollen legs and that her safety socks were to tight. Safety socks removed to promote better circulation in the lower extremity. Lower extremities elevated on a pillow and warmth applied to both knees. She also received Tylenol 650 mg for pain of 7.  0045 re-assessed pain patient noted Tylenol during very little, but the warmth applied is making her knees feel better. Will continue to monitor patient for pain.  0300  Patient ups sitting on the bed. She is slowly waking up. In the day room listening to music. Will continue to monitor patient for safety.

## 2022-04-02 NOTE — BH Assessment (Signed)
1945  Patient states her daughter is coming to bring her some clothes, and the items that is in the locker she wants to send home with her daughter. She is alert and oriented x 3, denying SI/HI, A/V hallucinations, depression. She is reporting pain  a pain level of 6/10. She is not able to be medicated at this time. Will provide comfort measures until she can received pain medication.  2000 Patient is requesting that her daughter take home all of her belongings that was in the locker. New items received were listed and placed on patient's belongings list and given to her. Items in locker was given to patient's daughter to take home.  2135 Patient received Ibuprofen 400 mg for knee pain with a pain level of 7.   0145 Patient up requesting ice which she received and return to her room.  0400 Patient remains resting in bed with eyes closed.  3817  Patient up  at 0450 sitting in the chair next to the telephone.  She requested and received Tylenol 650 mg for knee pain with a pain level of 6.   0624 Patient sitting in the day room watching TV. Will continue to monitor patient for safety.

## 2022-04-03 DIAGNOSIS — F250 Schizoaffective disorder, bipolar type: Principal | ICD-10-CM

## 2022-04-03 LAB — CREATININE, SERUM
Creatinine, Ser: 1.17 mg/dL — ABNORMAL HIGH (ref 0.44–1.00)
GFR, Estimated: 50 mL/min — ABNORMAL LOW (ref >60)

## 2022-04-03 LAB — CBC
HCT: 32.5 % — ABNORMAL LOW (ref 36.0–46.0)
Hemoglobin: 10.3 g/dL — ABNORMAL LOW (ref 12.0–15.0)
MCH: 30.7 pg (ref 26.0–34.0)
MCHC: 31.7 g/dL (ref 30.0–36.0)
MCV: 97 fL (ref 80.0–100.0)
Platelets: 166 10*3/uL (ref 150–400)
RBC: 3.35 MIL/uL — ABNORMAL LOW (ref 3.87–5.11)
RDW: 14.1 % (ref 11.5–15.5)
WBC: 5 10*3/uL (ref 4.0–10.5)
nRBC: 0 % (ref 0.0–0.2)

## 2022-04-03 LAB — GLUCOSE, CAPILLARY
Glucose-Capillary: 97 mg/dL (ref 70–99)
Glucose-Capillary: 159 mg/dL — ABNORMAL HIGH (ref 70–99)
Glucose-Capillary: 174 mg/dL — ABNORMAL HIGH (ref 70–99)
Glucose-Capillary: 117 mg/dL — ABNORMAL HIGH (ref 70–99)
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
## 2022-04-03 NOTE — Progress Notes (Signed)
Abnormal EKG obtained. Paged via chat Provider on call and made aware. No orders at this time.

## 2022-04-03 NOTE — B[facility] Group Notes (Signed)
LCSW Wellness Group Note   04/03/2022 2:00pm  Type of Group and Topic: Psychoeducational Group:  Wellness  Participation Level: active   Description of Group  Wellness group introduces the topic and its focus on developing healthy habits across the spectrum and its relationship to a decrease in hospital admissions.  Six areas of wellness are discussed: physical, social spiritual, intellectual, occupational, and emotional.  Patients are asked to consider their current wellness habits and to identify areas of wellness where they are interested and able to focus on improvements.    Therapeutic Goals Patients will understand components of wellness and how they can positively impact overall health.  Patients will identify areas of wellness where they have developed good habits. Patients will identify areas of wellness where they would like to make improvements.    Summary of Patient Progress: pt friendly and attentive during group but had trouble staying up with the conversation, often repetative.  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  Pt identified social as a wellness area that needs improvement.       Therapeutic Modalities: Cognitive Behavioral Therapy Psychoeducation    Joanne Chars, LCSW
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
## 2022-04-04 LAB — GLUCOSE, CAPILLARY
Glucose-Capillary: 156 mg/dL — ABNORMAL HIGH (ref 70–99)
Glucose-Capillary: 78 mg/dL (ref 70–99)

## 2022-04-04 MED ORDER — PALIPERIDONE PALMITATE ER 156 MG/ML IM SUSY: 156.0000 mg | PREFILLED_SYRINGE | INTRAMUSCULAR | 5 refills | Status: DC

## 2022-04-04 MED ORDER — PALIPERIDONE PALMITATE ER 156 MG/ML IM SUSY: 156.0000 mg | PREFILLED_SYRINGE | INTRAMUSCULAR | Status: DC

## 2022-04-04 MED ORDER — ROSUVASTATIN CALCIUM 40 MG PO TABS: 40.0000 mg | ORAL_TABLET | Freq: Every day | ORAL | 2 refills | Status: DC

## 2022-04-04 MED ORDER — DONEPEZIL HCL 5 MG PO TABS: 5.0000 mg | ORAL_TABLET | Freq: Every day | ORAL | 3 refills | Status: DC

## 2022-04-04 MED ORDER — PANTOPRAZOLE SODIUM 40 MG PO TBEC: 40.0000 mg | DELAYED_RELEASE_TABLET | Freq: Every day | ORAL | 2 refills | Status: DC

## 2022-04-04 MED ORDER — METFORMIN HCL 1000 MG PO TABS: 1000.0000 mg | ORAL_TABLET | Freq: Two times a day (BID) | ORAL | 2 refills | Status: DC

## 2022-04-04 MED ORDER — AMLODIPINE BESYLATE 5 MG PO TABS: 5.0000 mg | ORAL_TABLET | Freq: Every day | ORAL | 3 refills | Status: DC

## 2022-04-04 MED ORDER — PALIPERIDONE ER 6 MG PO TB24: 6.0000 mg | ORAL_TABLET | Freq: Every day | ORAL | 3 refills | Status: DC

## 2022-04-04 MED ORDER — NITROGLYCERIN 0.4 MG SL SUBL: 0.4000 mg | SUBLINGUAL_TABLET | SUBLINGUAL | 12 refills | Status: DC | PRN

## 2022-04-04 MED ORDER — DIVALPROEX SODIUM 500 MG PO DR TAB: 500.0000 mg | DELAYED_RELEASE_TABLET | Freq: Two times a day (BID) | ORAL | 3 refills | Status: DC

## 2022-04-04 MED ORDER — METOPROLOL SUCCINATE ER 50 MG PO TB24: 50.0000 mg | ORAL_TABLET | Freq: Every day | ORAL | 11 refills | Status: DC

## 2022-04-04 NOTE — Progress Notes (Signed)

## 2022-04-04 NOTE — Care Management Important Message (Signed)
Important Message  Patient Details  Name: [name] Long MRN: [id number] Date of Birth: 1951/05/21   Medicare Important Message Given:  Yes   CSW spoke with the guardian and provided education on [facility] appeals.  Rozann Lesches, LCSW 04/04/2022, 10:54 AM

## 2022-04-04 NOTE — Group Note (Signed)
[facility] LCSW Group Therapy Note    Group Date: 04/04/2022 Start Time: 1330 End Time: 1415  Type of Therapy and Topic:  Group Therapy:  Overcoming Obstacles  Participation Level:  [facility] PARTICIPATION LEVEL: Did Not Attend  Mood:  Description of Group:   In this group patients will be encouraged to explore what they see as obstacles to their own wellness and recovery. They will be guided to discuss their thoughts, feelings, and behaviors related to these obstacles. The group will process together ways to cope with barriers, with attention given to specific choices patients can make. Each patient will be challenged to identify changes they are motivated to make in order to overcome their obstacles. This group will be process-oriented, with patients participating in exploration of their own experiences as well as giving and receiving support and challenge from other group members.  Therapeutic Goals: 1. Patient will identify personal and current obstacles as they relate to admission. 2. Patient will identify barriers that currently interfere with their wellness or overcoming obstacles.  3. Patient will identify feelings, thought process and behaviors related to these barriers. 4. Patient will identify two changes they are willing to make to overcome these obstacles:    Summary of Patient Progress Patient was present in group. Patient was preoccupied with discussing her medications as well.  Patient was not easily redirected. CSW observed the patient to sit with her eyes closed and making movements with her hands as if fighting someone.  Therapeutic Modalities:   Cognitive Behavioral Therapy Solution Focused Therapy Motivational Interviewing Relapse Prevention Therapy   Rozann Lesches, LCSW

## 2022-04-04 NOTE — Plan of Care (Signed)
D- Patient alert and oriented. Patient alert to place and time during 0829 assessment. Patient in pleasant mood and flat affect. Patient denies SI, HI, AVH, and endorses pain of a 6. Patient states she wants to discharge today and go home.  Patient is expected to discharge this afternoon.   A- Scheduled medications administered to patient, per MD orders. Support and encouragement provided.  Routine safety checks conducted every 15 minutes.  Patient informed to notify staff with problems or concerns.  R- No adverse drug reactions noted. Patient contracts for safety at this time. Patient compliant with medications and treatment plan. Patient receptive, calm, and cooperative. Patient interacts well with others on the unit.  Patient remains safe at this time.   Problem: Nutrition: Goal: Adequate nutrition will be maintained Outcome: Progressing   Problem: Coping: Goal: Level of anxiety will decrease Outcome: Progressing   Problem: Pain Managment: Goal: General experience of comfort will improve Outcome: Progressing   Problem: Safety: Goal: Ability to remain free from injury will improve Outcome: Progressing   Problem: Health Behavior/Discharge Planning: Goal: Ability to safely manage health-related needs after discharge will improve Outcome: Progressing
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
## 2022-04-04 NOTE — Progress Notes (Signed)
  [facility] Adult Case Management Discharge Plan :  Will you be returning to the same living situation after discharge:  Yes,  pt reports that she is retuning home.  At discharge, do you have transportation home?: Yes,  daughter to provide transportation. Do you have the ability to pay for your medications: MEDICARE / MEDICARE PART A AND B  Release of information consent forms completed and in the chart;  Patient's signature needed at discharge.  Patient to Follow up at:  Follow-up Information     [facility] Follow up.   Why: Appointment is scheduled for 04/11/2022 at 8:30AM.  This will be via phone call to [phone], [place] phone.  ACTT team referral has been submitted. Contact information: [place]  [place] [address] [phone]                 Next level of care provider has access to [place] and Suicide Prevention discussed: Yes,  SPE completed with the guardian.     Has patient been referred to the Quitline?: Patient refused referral  Patient has been referred for addiction treatment: Pt. refused referral  Rozann Lesches, LCSW 04/04/2022, 10:55 AM

## 2022-04-04 NOTE — BHH Counselor (Signed)
CSW spoke with the patient's daughter/legal guardian, Dariella Gillihan, [phone].  CSW reviewed patient's scheduled discharge  CSW reviewed appointments.  CSW completed Medicare IM.   Daughter asked that medications be sent to [facility] on [address] in [place].  She requests that medication be in bubble packs.  CSW has made physician aware.  Daughter reports that she will pick the patient up after 5pm.  CSW provided instructions on coming to the [place].  [name], MSW, LCSW 04/04/2022 11:06 AM
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
## 2022-04-04 NOTE — BHH Suicide Risk Assessment (Signed)
[facility] Discharge Suicide Risk Assessment   Principal Problem: Schizoaffective disorder, bipolar type ([place]) Discharge Diagnoses: Principal Problem:   Schizoaffective disorder, bipolar type ([place])   Total Time spent with patient: 1 hour  Musculoskeletal: Strength & Muscle Tone: within normal limits Gait & Station: normal Patient leans: N/A  Psychiatric Specialty Exam  Presentation  General Appearance:  Appropriate for Environment; Casual  Eye Contact: Good  Speech: Normal Rate  Speech Volume: Normal  Handedness: Right   Mood and Affect  Mood: Euthymic  Duration of Depression Symptoms: Greater than two weeks  Affect: Congruent; Appropriate   Thought Process  Thought Processes: Goal Directed; Disorganized  Descriptions of Associations:Tangential  Orientation:Full (Time, Place and Person)  Thought Content:Illogical; Paranoid Ideation; Perseveration  History of Schizophrenia/Schizoaffective disorder:Yes  Duration of Psychotic Symptoms:Greater than six months  Hallucinations:No data recorded Ideas of Reference:Paranoia  Suicidal Thoughts:No data recorded Homicidal Thoughts:No data recorded  Sensorium  Memory: Immediate Fair; Recent Fair  Judgment: Impaired  Insight: Lacking; Shallow   Executive Functions  Concentration: Fair  Attention Span: Poor  Recall: Poor  Fund of Knowledge: Fair  Language: Fair   Engineer, water Activity:No data recorded  Assets  Assets: Financial Resources/Insurance; Desire for Improvement; Housing; Social Support   Sleep  Sleep:No data recorded   Blood pressure 106/66, pulse 75, temperature 98.3 F (36.8 C), temperature source Oral, resp. rate 19, height '5\' 5"'$  (1.651 m), weight 69.9 kg, SpO2 99 %. Body mass index is 25.63 kg/m.  Mental Status Per Nursing Assessment::   On Admission:  NA  Demographic Factors:  Age 56 or older and Living alone  Loss Factors: Decline in  physical health  Historical Factors: NA  Risk Reduction Factors:   Positive social support, Positive therapeutic relationship, and Positive coping skills or problem solving skills  Continued Clinical Symptoms:  Schizophrenia:   Paranoid or undifferentiated type  Cognitive Features That Contribute To Risk:  None    Suicide Risk:  Minimal: No identifiable suicidal ideation.  Patients presenting with no risk factors but with morbid ruminations; may be classified as minimal risk based on the severity of the depressive symptoms   Follow-up Information     [facility] Follow up.   Why: Appointment is scheduled for 04/11/2022 at 8:30AM.  This will be via phone call to [phone], [place] phone. Contact information: [address]  [place] [address] [phone]                 Plan Of Care/Follow-up recommendations: Jefm Bryant, DO 04/04/2022, 9:36 AM

## 2022-04-04 NOTE — Progress Notes (Signed)
Patient in the dayroom speaking out loud to herself. Writer assessing patient and administering mid morning medications. Patient is presenting with flight of ideas during Probation officer interaction with patient.   04/04/22 1010  Thought Process  Coherency Flight of ideas  Content Preoccupation  Delusions None reported or observed  Perception WDL  Hallucination Auditory  Judgment WDL  Confusion Mild  Danger to Self  Current suicidal ideation? Denies  Agreement Not to Harm Self Yes  Description of Agreement verbal  Danger to Others  Danger to Others None reported or observed

## 2022-04-05 ENCOUNTER — Telehealth: Payer: Self-pay

## 2022-04-05 NOTE — Telephone Encounter (Signed)
Transition Care Management Follow-up Telephone Call Date of discharge and from where: 04/04/2022, [facility] How have you been since you were released from the hospital? normal Any questions or concerns? No  Items Reviewed: Did the pt receive and understand the discharge instructions provided? Yes  Medications obtained and verified? Yes  Other? No  Any new allergies since your discharge? No  Dietary orders reviewed? Yes Do you have support at home? Yes   Home Care and Equipment/Supplies: Were home health services ordered? not applicable If so, what is the name of the agency? N/A  Has the agency set up a time to come to the patient's home? no Were any new equipment or medical supplies ordered?  No What is the name of the medical supply agency? N/A Were you able to get the supplies/equipment? not applicable Do you have any questions related to the use of the equipment or supplies? No  Functional Questionnaire: (I = Independent and D = Dependent) ADLs: I  Bathing/Dressing- I  Meal Prep- I  Eating- I  Maintaining continence- I  Transferring/Ambulation- I  Managing Meds- I  Follow up appointments reviewed:  PCP Hospital f/u appt confirmed? Yes  Scheduled to see Durenda Age, NP on 04/07/2022 @ [facility] f/u appt confirmed? Yes  Scheduled to see [facility], on 04/11/2022 @ n/a. Are transportation arrangements needed? No  If their condition worsens, is the pt aware to call PCP or go to the Emergency Dept.? Yes Was the patient provided with contact information for the PCP's office or ED? Yes Was to pt encouraged to call back with questions or concerns? Yes

## 2022-04-05 NOTE — [facility] Counselor (Signed)
CSW received call from patient's guardian at 9:23AM.  Guardian requested information on why patient was prescribed "Donepezil".   CSW reports that she will reach out to the physician and make request and call family back to follow up.  CSW provided contact information to physician and requested that he contact the family.  Physician reports that he will follow up.  CSW sent secure chat and discussed with physician between 9:25AM and 9:40AM.  CSW followed up with guardian at 12:06PM.  Guardian reports that physician has not called. CSW provided update as written in secure chat about the medication and informed that she will reiterate that family would like a call back.  CSW has sent another secure chat.  [name], MSW, LCSW 04/05/2022 12:12 PM

## 2022-04-06 ENCOUNTER — Ambulatory Visit: Payer: Medicare Other | Admitting: Family

## 2022-04-07 ENCOUNTER — Telehealth: Payer: Self-pay

## 2022-04-07 ENCOUNTER — Ambulatory Visit (INDEPENDENT_AMBULATORY_CARE_PROVIDER_SITE_OTHER): Payer: Medicare Other | Admitting: Adult Health

## 2022-04-07 ENCOUNTER — Encounter: Payer: Self-pay | Admitting: Adult Health

## 2022-04-07 VITALS — BP 154/92 | HR 75 | Temp 98.8°F | Ht 65.0 in | Wt 162.4 lb

## 2022-04-07 DIAGNOSIS — K219 Gastro-esophageal reflux disease without esophagitis: Secondary | ICD-10-CM

## 2022-04-07 DIAGNOSIS — N183 Chronic kidney disease, stage 3 unspecified: Secondary | ICD-10-CM | POA: Diagnosis not present

## 2022-04-07 DIAGNOSIS — Z79899 Other long term (current) drug therapy: Secondary | ICD-10-CM | POA: Diagnosis not present

## 2022-04-07 DIAGNOSIS — F250 Schizoaffective disorder, bipolar type: Secondary | ICD-10-CM

## 2022-04-07 DIAGNOSIS — E039 Hypothyroidism, unspecified: Secondary | ICD-10-CM | POA: Diagnosis not present

## 2022-04-07 DIAGNOSIS — D649 Anemia, unspecified: Secondary | ICD-10-CM

## 2022-04-07 DIAGNOSIS — E1122 Type 2 diabetes mellitus with diabetic chronic kidney disease: Secondary | ICD-10-CM | POA: Diagnosis not present

## 2022-04-07 DIAGNOSIS — E876 Hypokalemia: Secondary | ICD-10-CM | POA: Diagnosis not present

## 2022-04-07 DIAGNOSIS — I214 Non-ST elevation (NSTEMI) myocardial infarction: Secondary | ICD-10-CM

## 2022-04-07 DIAGNOSIS — E785 Hyperlipidemia, unspecified: Secondary | ICD-10-CM

## 2022-04-07 MED ORDER — SUCRALFATE 1 GM/10ML PO SUSP: 1.0000 g | Freq: Three times a day (TID) | ORAL | 0 refills | Status: DC

## 2022-04-07 MED ORDER — PANTOPRAZOLE SODIUM 40 MG PO TBEC: 40.0000 mg | DELAYED_RELEASE_TABLET | Freq: Every day | ORAL | 2 refills | Status: DC

## 2022-04-07 MED ORDER — DAPAGLIFLOZIN PROPANEDIOL 10 MG PO TABS: 10.0000 mg | ORAL_TABLET | Freq: Every day | ORAL | 3 refills | Status: DC

## 2022-04-07 MED ORDER — FREESTYLE LITE TEST VI STRP: ORAL_STRIP | 5 refills | Status: DC

## 2022-04-07 MED ORDER — METFORMIN HCL 500 MG PO TABS: 500.0000 mg | ORAL_TABLET | Freq: Two times a day (BID) | ORAL | 3 refills | Status: DC

## 2022-04-07 MED ORDER — FREESTYLE LANCETS MISC: 5 refills | Status: DC

## 2022-04-07 NOTE — Patient Instructions (Signed)

## 2022-04-07 NOTE — Telephone Encounter (Signed)
Patient requested a refill for test strips and lancets and they was send into pharmacy for pt.
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
## 2022-04-08 ENCOUNTER — Ambulatory Visit: Payer: Self-pay

## 2022-04-08 ENCOUNTER — Ambulatory Visit (INDEPENDENT_AMBULATORY_CARE_PROVIDER_SITE_OTHER): Payer: Medicare Other | Admitting: Family

## 2022-04-08 DIAGNOSIS — S99912A Unspecified injury of left ankle, initial encounter: Secondary | ICD-10-CM

## 2022-04-08 DIAGNOSIS — M170 Bilateral primary osteoarthritis of knee: Secondary | ICD-10-CM

## 2022-04-08 DIAGNOSIS — W108XXA Fall (on) (from) other stairs and steps, initial encounter: Secondary | ICD-10-CM

## 2022-04-08 LAB — HEPATIC FUNCTION PANEL
AG Ratio: 1.2 (calc) (ref 1.0–2.5)
ALT: 9 U/L (ref 6–29)
AST: 9 U/L — ABNORMAL LOW (ref 10–35)
Albumin: 3.6 g/dL (ref 3.6–5.1)
Alkaline phosphatase (APISO): 84 U/L (ref 37–153)
Bilirubin, Direct: 0 mg/dL (ref 0.0–0.2)
Globulin: 3 g/dL (calc) (ref 1.9–3.7)
Indirect Bilirubin: 0.1 mg/dL (calc) — ABNORMAL LOW (ref 0.2–1.2)
Total Bilirubin: 0.1 mg/dL — ABNORMAL LOW (ref 0.2–1.2)
Total Protein: 6.6 g/dL (ref 6.1–8.1)

## 2022-04-08 LAB — CBC WITH DIFFERENTIAL/PLATELET
Absolute Monocytes: 799 cells/uL (ref 200–950)
Basophils Absolute: 10 cells/uL (ref 0–200)
Basophils Relative: 0.2 %
Eosinophils Absolute: 142 cells/uL (ref 15–500)
Eosinophils Relative: 2.9 %
HCT: 33 % — ABNORMAL LOW (ref 35.0–45.0)
Hemoglobin: 10.7 g/dL — ABNORMAL LOW (ref 11.7–15.5)
Lymphs Abs: 1779 cells/uL (ref 850–3900)
MCH: 31.7 pg (ref 27.0–33.0)
MCHC: 32.4 g/dL (ref 32.0–36.0)
MCV: 97.6 fL (ref 80.0–100.0)
MPV: 11.6 fL (ref 7.5–12.5)
Monocytes Relative: 16.3 %
Neutro Abs: 2171 cells/uL (ref 1500–7800)
Neutrophils Relative %: 44.3 %
Platelets: 162 10*3/uL (ref 140–400)
RBC: 3.38 10*6/uL — ABNORMAL LOW (ref 3.80–5.10)
RDW: 13.9 % (ref 11.0–15.0)
Total Lymphocyte: 36.3 %
WBC: 4.9 10*3/uL (ref 3.8–10.8)

## 2022-04-08 LAB — BASIC METABOLIC PANEL
BUN/Creatinine Ratio: 11 (calc) (ref 6–22)
BUN: 15 mg/dL (ref 7–25)
CO2: 28 mmol/L (ref 20–32)
Calcium: 8.7 mg/dL (ref 8.6–10.4)
Chloride: 105 mmol/L (ref 98–110)
Creat: 1.37 mg/dL — ABNORMAL HIGH (ref 0.60–1.00)
Glucose, Bld: 106 mg/dL — ABNORMAL HIGH (ref 65–99)
Potassium: 4.1 mmol/L (ref 3.5–5.3)
Sodium: 140 mmol/L (ref 135–146)

## 2022-04-08 LAB — LIPID PANEL
Cholesterol: 147 mg/dL (ref <200)
HDL: 50 mg/dL (ref >=50)
LDL Cholesterol (Calc): 79 mg/dL (calc)
Non-HDL Cholesterol (Calc): 97 mg/dL (calc) (ref <130)
Total CHOL/HDL Ratio: 2.9 (calc) (ref <5.0)
Triglycerides: 92 mg/dL (ref <150)

## 2022-04-08 LAB — TSH: TSH: 1.56 mIU/L (ref 0.40–4.50)

## 2022-04-08 LAB — HEMOGLOBIN A1C
Hgb A1c MFr Bld: 8.9 % of total Hgb — ABNORMAL HIGH (ref <5.7)
Mean Plasma Glucose: 209 mg/dL
eAG (mmol/L): 11.6 mmol/L

## 2022-04-08 NOTE — Progress Notes (Signed)
Office Visit Note   Patient: [name] Long           Date of Birth: 04/30/1951           MRN: [id number] Visit Date: 04/08/2022              Requested by: Sandrea Hughs, NP [address] [place],  [place] [address] PCP: Ngetich, Nelda Bucks, NP  No chief complaint on file.     HPI: The patient is a 71 year old woman who presents today complaining of left lower leg and ankle pain after climbing her stairs to get into bed and slipping off of the steps twisted her leg and ankle. She is concerned for fracture.  Complaining of some chronic knee pain as well this is acutely worsened since the fall points to the anterior knee.  Has been previously seen for her osteoarthritis bilateral knees and is awaiting monovisc injection bilaterally.  Assessment & Plan: Visit Diagnoses:  1. Fall (on) (from) other stairs and steps, initial encounter   2. Left ankle injury, initial encounter     Plan: Bilateral knees injected with Monovisc.  Patient tolerated well.  Reassurance of the ankle.  Advance weightbearing as tolerated.  Set up with an ASO  Monovisc's for bilateral knees Lot #[id number] expiration 12-31-2024  Follow-Up Instructions: No follow-ups on file.   Left Ankle Exam   Tenderness  The patient is experiencing tenderness in the ATF.  Swelling: mild  Range of Motion  The patient has normal left ankle ROM.   Tests  Anterior drawer: negative   Right Knee Exam   Tenderness  The patient is experiencing tenderness in the medial joint line.  Range of Motion  The patient has normal right knee ROM.  Tests  Varus: negative Valgus: negative  Other  Effusion: no effusion present   Left Knee Exam   Tenderness  The patient is experiencing tenderness in the medial joint line.  Range of Motion  The patient has normal left knee ROM.  Tests  Varus: negative Valgus: negative  Other  Effusion: no effusion present      Patient is alert, oriented, no adenopathy,  well-dressed, normal affect, normal respiratory effort.   Imaging: No results found. No images are attached to the encounter.  Labs: Lab Results  Component Value Date   HGBA1C 8.9 (H) 04/07/2022   HGBA1C 9.7 (H) 03/07/2022   HGBA1C 8.3 (H) 04/19/2021   REPTSTATUS 10/20/2021 FINAL 10/18/2021   CULT (A) 10/18/2021    <10,000 COLONIES/mL INSIGNIFICANT GROWTH Performed at [facility], [place] [address] [place], [place] [address]    LABORGA NO GROWTH 12/04/2014     Lab Results  Component Value Date   ALBUMIN 3.1 (L) 03/08/2022   ALBUMIN 3.4 (L) 03/07/2022   ALBUMIN 2.8 (L) 05/07/2021    Lab Results  Component Value Date   MG 2.0 03/18/2022   MG 2.0 03/12/2022   MG 2.0 03/07/2022   Lab Results  Component Value Date   VD25OH 43.0 02/23/2018   VD25OH 30 05/06/2015   VD25OH 12 (L) 12/18/2014    No results found for: "PREALBUMIN"    Latest Ref Rng & Units 04/07/2022   12:00 PM 04/03/2022    9:52 AM 03/18/2022    8:19 AM  CBC EXTENDED  WBC 3.8 - 10.8 Thousand/uL 4.9  5.0  5.9   RBC 3.80 - 5.10 Million/uL 3.38  3.35  3.67   Hemoglobin 11.7 - 15.5 g/dL 10.7  10.3  11.6   HCT 35.0 - 45.0 % 33.0  32.5  35.9   Platelets 140 - 400 Thousand/uL 162  166  230   NEUT# 1,500 - 7,800 cells/uL 2,171     Lymph# 850 - 3,900 cells/uL 1,779        There is no height or weight on file to calculate BMI.  Orders:  Orders Placed This Encounter  Procedures   XR Ankle Complete Left   No orders of the defined types were placed in this encounter.    Procedures: Large Joint Inj: bilateral knee on 05/03/2022 8:49 AM Indications: pain Details: 18 G 1.5 in needle, anteromedial approach Medications (Right): 1 mL lidocaine 1 %; 88 mg Hyaluronan 88 MG/4ML Medications (Left): 1 mL lidocaine 1 %; 88 mg Hyaluronan 88 MG/4ML Consent was given by the patient.      Clinical Data: No additional findings.  ROS:  All other systems negative, except as noted in the HPI. Review of  Systems  Constitutional: Negative.   Musculoskeletal:  Positive for arthralgias and joint swelling.  Neurological:  Negative for weakness and numbness.    Objective: Vital Signs: There were no vitals taken for this visit.  Specialty Comments:  No specialty comments available.  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   Atrial flutter with rapid ventricular response ([place]) 07/10/2017   Atrial fibrillation ([place]) 07/05/2017   COPD (chronic obstructive pulmonary disease) ([place]) 01/22/2017   Congestive heart failure (CHF) ([place])    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 08/15/2016   Osteoarthritis, multiple sites 06/08/2011   CHRONIC KIDNEY DISEASE STAGE II (MILD) 09/14/2009   Hypothyroidism 08/31/2006   HYPERTENSION, BENIGN SYSTEMIC 08/31/2006   Past Medical History:  Diagnosis Date   Abscessed tooth 02/09/2020   Acute on chronic congestive heart failure ([place])    Adenomatous colon polyp    Arthritis    "real bad; all over" (07/12/2017)   Asthma    Atrial fibrillation ([place])    BIPOLAR DISORDER 08/31/2006   Qualifier: Diagnosis of  By: Dorathy Daft MD, Marjory Lies     CHRONIC KIDNEY DISEASE STAGE II (MILD) 09/14/2009   Annotation: eGFR 88 Qualifier: Diagnosis of  By: Jess Barters MD, Erik     Chronic lower back pain    Congestive heart failure (CHF) ([place])    COPD (chronic obstructive pulmonary disease) ([place]) 09/23/2010   Diagnosed at [facility] in 2008 (Dr. Annamaria Boots)    DM (diabetes mellitus) type II controlled with renal manifestation ([place]) 08/31/2006    Qualifier: Diagnosis of  By: Dorathy Daft MD, Marjory Lies     Dyspnea    "all my life; since 6th grade" (07/12/2017)   Fatty liver    HYPERCHOLESTEROLEMIA 08/31/2006   Intolerance to Lipitor OK on Crestor but medicaid no longer covering     HYPERTENSION, BENIGN SYSTEMIC 08/31/2006   Qualifier: Diagnosis of  By: Dorathy Daft MD, Aaron     Hypokalemia    HYPOTHYROIDISM, UNSPECIFIED 08/31/2006   Qualifier: Diagnosis of  By: Dorathy Daft MD, Marjory Lies     Leg swelling 03/14/2018   Pneumonia    "3 times" (07/12/2017)   Pulmonary nodule  Renal cyst    Schizophrenia ([place])    Scoliosis    Stomach problems    Thyroid disorder     Family History  Problem Relation Age of Onset   Heart disease Mother 49   Rectal cancer Mother    Diabetes Mother    High Cholesterol Mother    Hypertension Mother    Stroke Mother    Diabetes Father 32   Hypertension Father    Breast cancer Sister    High Cholesterol Sister    Hypertension Sister    Breast cancer Sister    Heart disease Brother    High Cholesterol Brother    Hypertension Brother    Colon cancer Maternal Grandmother    Asthma Daughter    Asthma Daughter    Asthma Son    Stomach cancer Neg Hx    Esophageal cancer Neg Hx    Pancreatic cancer Neg Hx     Past Surgical History:  Procedure Laterality Date   CESAREAN SECTION     FOOT FRACTURE SURGERY Right    "steel plate in it"   FOOT SURGERY     " born w/dislocated foot"   FRACTURE SURGERY     KNEE ARTHROSCOPY Right    TOE SURGERY Bilateral    "both pinky toes"   TONSILLECTOMY AND ADENOIDECTOMY     Social History   Occupational History   Not on file  Tobacco Use   Smoking status: Every Day    Packs/day: 0.25    Years: 45.00    Total pack years: 11.25    Types: Cigarettes   Smokeless tobacco: Never   Tobacco comments:    patient states she smokes 1 cigarette per month  Vaping Use   Vaping Use: Never used  Substance and Sexual Activity   Alcohol use: Not Currently    Comment: 07/12/2017  "stopped 2 yr ago; did have a drink over the holidays recently"   Drug use: No   Sexual activity: Not Currently

## 2022-04-11 ENCOUNTER — Other Ambulatory Visit: Payer: Self-pay | Admitting: Adult Health

## 2022-04-11 DIAGNOSIS — F209 Schizophrenia, unspecified: Secondary | ICD-10-CM | POA: Diagnosis not present

## 2022-04-11 NOTE — Progress Notes (Signed)
-    Hgb 10,9, has been same from previous labs, anemia is stable. Lipid panel and tsh normal  hepatic function -  stable  No new orders

## 2022-04-12 NOTE — Telephone Encounter (Signed)
Okay if patient was already seen by Lamona Curl

## 2022-04-12 NOTE — Progress Notes (Signed)
BMP (kidney function) was done as well, and they are good!

## 2022-04-13 ENCOUNTER — Telehealth: Payer: Self-pay | Admitting: Orthopedic Surgery

## 2022-04-13 NOTE — Telephone Encounter (Signed)
Pt called asking for a an immediate call back. Pt states her head is hurting to the point of maybe brain aneurism. Please call pt at [phone].
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
## 2022-04-14 ENCOUNTER — Ambulatory Visit: Payer: Medicare Other | Admitting: Orthopedic Surgery

## 2022-04-14 NOTE — Telephone Encounter (Signed)
At this time, I do not see where these have been given to her in office or an Rx order sent to outside facility.

## 2022-04-14 NOTE — Telephone Encounter (Signed)
Patient called in stating she needs 2 new knee braces and she needs a adjustable one size 2x and a back brace  2X being hers is too small and does not fit her anymore please fax over order to [phone]

## 2022-04-18 ENCOUNTER — Telehealth: Payer: Self-pay | Admitting: Orthopedic Surgery

## 2022-04-18 NOTE — Telephone Encounter (Signed)
See previous message in regards to new knee braces. I have been out of office and will work on this request.

## 2022-04-18 NOTE — Telephone Encounter (Signed)
Patient called. She would like 2 knee braces. Her call back number is [phone]

## 2022-04-19 NOTE — Telephone Encounter (Signed)
In process for knee brace through [employer] online.

## 2022-04-20 NOTE — Telephone Encounter (Signed)
[employer] does not supply knee braces through [employer] [facility]. I have filled out Rx and pending MD signature. Once complete will fax to [employer] at number provided below.

## 2022-04-21 NOTE — Telephone Encounter (Signed)
Faxed

## 2022-05-02 NOTE — Telephone Encounter (Signed)
Patient would like a refill on Methocarbamol and pain killers, she would also like her knee Brace sent to her home being it is hard for her to get around

## 2022-05-02 NOTE — Telephone Encounter (Signed)
Pt requesting robaxin, last filled 11/11/21 which looks like hospital d/c on 03/07/22 after her last hospital stay. Ibuprofen '800mg'$  requested as well, last filled 03/10/22 but this looks like d/c at discharge from hospital at same time. Please advise  (I will call her to tell her to contact [employer] on her knee brace to be delivered.)

## 2022-05-03 ENCOUNTER — Encounter: Payer: Self-pay | Admitting: Family

## 2022-05-03 ENCOUNTER — Telehealth: Payer: Self-pay | Admitting: Orthopedic Surgery

## 2022-05-03 DIAGNOSIS — M170 Bilateral primary osteoarthritis of knee: Secondary | ICD-10-CM

## 2022-05-03 MED ORDER — HYALURONAN 88 MG/4ML IX SOSY
88.0000 mg | PREFILLED_SYRINGE | INTRA_ARTICULAR | Status: AC | PRN
  Administered 2022-05-03: 88 mg via INTRA_ARTICULAR

## 2022-05-03 MED ORDER — LIDOCAINE HCL 1 % IJ SOLN
1.0000 mL | INTRAMUSCULAR | Status: AC | PRN
  Administered 2022-05-03: 1 mL

## 2022-05-03 NOTE — Telephone Encounter (Signed)
Patient needs med called into pharmacy for pain she is not sure of the name of meds. Best contact number [phone]

## 2022-05-03 NOTE — Telephone Encounter (Signed)
Pt called 5 minutes ago stating she was calling about her med refills. I called her 15 minutes ago and just now and still no answer. LMTCB x2. Asked her to call back and ask for me personally so we are not calling over again and missing each others calls.

## 2022-05-03 NOTE — Telephone Encounter (Signed)
Duplicate message. 

## 2022-05-03 NOTE — Telephone Encounter (Signed)
I just called patient back prior to her calling. She did not answer.

## 2022-05-03 NOTE — Telephone Encounter (Signed)
lmtcb

## 2022-05-03 NOTE — Telephone Encounter (Signed)
Pt called again about an update about her refill. Please call pt at [phone]. Pt states she is in severe pain

## 2022-05-04 NOTE — Telephone Encounter (Signed)
lmtcb

## 2022-05-05 NOTE — Telephone Encounter (Signed)
Pt has not returned my calls. I have made 3-4 attempts to reach her. I will close out this message.

## 2022-05-06 DIAGNOSIS — F17210 Nicotine dependence, cigarettes, uncomplicated: Secondary | ICD-10-CM | POA: Diagnosis not present

## 2022-05-06 DIAGNOSIS — F209 Schizophrenia, unspecified: Secondary | ICD-10-CM | POA: Diagnosis not present

## 2022-05-09 ENCOUNTER — Ambulatory Visit: Payer: Medicare Other | Admitting: Orthopedic Surgery

## 2022-05-09 NOTE — Telephone Encounter (Signed)
Pt states she is unable to come to appt. She is waiting on EMS to take her to hospital for poss. Pneumonia. Pt states she still needs a refill on her pain medication and other meds.

## 2022-05-12 ENCOUNTER — Telehealth: Payer: Self-pay | Admitting: Orthopedic Surgery

## 2022-05-12 ENCOUNTER — Ambulatory Visit: Payer: Medicare Other | Admitting: Orthopedic Surgery

## 2022-05-12 NOTE — Telephone Encounter (Signed)
FYI Spoke with patient she wanted to cancel her appointment due to transportation problems. I advised patient Dr. Sharol Given will discuss her medication when she can schedule and come to her appointment. Patient said she do not know when she can come.  The number to contact patient if needed is [phone]

## 2022-05-12 NOTE — Telephone Encounter (Signed)
noted 

## 2022-05-18 ENCOUNTER — Other Ambulatory Visit: Payer: Self-pay

## 2022-05-18 DIAGNOSIS — J449 Chronic obstructive pulmonary disease, unspecified: Secondary | ICD-10-CM

## 2022-05-18 MED ORDER — ALBUTEROL SULFATE HFA 108 (90 BASE) MCG/ACT IN AERS: 2.0000 | INHALATION_SPRAY | Freq: Four times a day (QID) | RESPIRATORY_TRACT | 1 refills | Status: DC | PRN

## 2022-05-19 ENCOUNTER — Telehealth: Payer: Self-pay | Admitting: Orthopedic Surgery

## 2022-05-19 NOTE — Telephone Encounter (Signed)
Noted  

## 2022-05-19 NOTE — Telephone Encounter (Signed)
Patient called advised she need her medication and can not come into the office. I advised patient that Dr. Sharol Given need to see her in the office. Patient said she is going to get an arrest warrant served on Dr. Sharol Given like she did the last time.  Patient disconnected call. The number to contact patient is [phone]

## 2022-05-20 ENCOUNTER — Other Ambulatory Visit: Payer: Self-pay | Admitting: Orthopedic Surgery

## 2022-05-20 MED ORDER — IBUPROFEN 800 MG PO TABS: 800.0000 mg | ORAL_TABLET | Freq: Three times a day (TID) | ORAL | 0 refills | Status: DC | PRN

## 2022-05-25 ENCOUNTER — Telehealth: Payer: Self-pay

## 2022-05-25 NOTE — Telephone Encounter (Signed)
Patient called and states that we are trafficking her medications and using her insurance to file false claims. She states that she is going to sue the office for using her identity and that if we would only give her the oxycodone and other medications that she is requesting that she would not need to file charges and in fact she would drop the charges that she has pending if we would give her the medication she wants. Patient states that we are hiding Dr. Sharol Given and that she knows he is here giving out her medication to all the drug dealers she sees on the street in her neighborhood. Pt ended call.
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
## 2022-05-25 NOTE — Telephone Encounter (Signed)
Recommend office visit for evaluation of symptoms.sorry to hear that but provider if not involved in any sort of Gang with her sister.

## 2022-05-25 NOTE — Telephone Encounter (Signed)
Patient states she is calling the police cause Dr. Sharol Given, wont fill her medication and that Autumn F, is steeling her medication and working with [place] W. to traffic her medication to the gangs in [place]. And if I will get Dr. Sharol Given, to call in her Oxycodone or hydrocodone in to her pharmacy she will drop the charges. I advised Autumn F of the incident

## 2022-06-03 ENCOUNTER — Ambulatory Visit (INDEPENDENT_AMBULATORY_CARE_PROVIDER_SITE_OTHER): Payer: Medicare Other | Admitting: Adult Health

## 2022-06-03 ENCOUNTER — Encounter: Payer: Self-pay | Admitting: Adult Health

## 2022-06-03 VITALS — BP 118/70 | HR 78 | Temp 97.8°F | Resp 18 | Ht 65.0 in | Wt 152.0 lb

## 2022-06-03 DIAGNOSIS — R531 Weakness: Secondary | ICD-10-CM | POA: Diagnosis not present

## 2022-06-03 DIAGNOSIS — R300 Dysuria: Secondary | ICD-10-CM

## 2022-06-03 DIAGNOSIS — R059 Cough, unspecified: Secondary | ICD-10-CM | POA: Diagnosis not present

## 2022-06-03 LAB — POCT URINALYSIS DIPSTICK
Appearance: NORMAL
Blood, UA: NEGATIVE
Glucose, UA: POSITIVE — AB
Ketones, UA: NEGATIVE
Leukocytes, UA: NEGATIVE
Nitrite, UA: NEGATIVE
Protein, UA: NEGATIVE
Spec Grav, UA: >=1.03 — AB (ref 1.010–1.025)
Urobilinogen, UA: 0.2 E.U./dL
pH, UA: 5 (ref 5.0–8.0)

## 2022-06-03 MED ORDER — BENZONATATE 100 MG PO CAPS: 100.0000 mg | ORAL_CAPSULE | Freq: Three times a day (TID) | ORAL | 0 refills | Status: DC | PRN

## 2022-06-03 NOTE — Patient Instructions (Signed)
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
## 2022-06-04 LAB — URINE CULTURE
MICRO NUMBER:: 14259627
Result:: NO GROWTH
SPECIMEN QUALITY:: ADEQUATE

## 2022-06-04 LAB — CBC WITH DIFFERENTIAL/PLATELET
Absolute Monocytes: 754 cells/uL (ref 200–950)
Basophils Absolute: 22 cells/uL (ref 0–200)
Basophils Relative: 0.4 %
Eosinophils Absolute: 149 cells/uL (ref 15–500)
Eosinophils Relative: 2.7 %
HCT: 35 % (ref 35.0–45.0)
Hemoglobin: 11.9 g/dL (ref 11.7–15.5)
Lymphs Abs: 2178 cells/uL (ref 850–3900)
MCH: 31.6 pg (ref 27.0–33.0)
MCHC: 34 g/dL (ref 32.0–36.0)
MCV: 93.1 fL (ref 80.0–100.0)
MPV: 11 fL (ref 7.5–12.5)
Monocytes Relative: 13.7 %
Neutro Abs: 2398 cells/uL (ref 1500–7800)
Neutrophils Relative %: 43.6 %
Platelets: 233 10*3/uL (ref 140–400)
RBC: 3.76 10*6/uL — ABNORMAL LOW (ref 3.80–5.10)
RDW: 15.1 % — ABNORMAL HIGH (ref 11.0–15.0)
Total Lymphocyte: 39.6 %
WBC: 5.5 10*3/uL (ref 3.8–10.8)

## 2022-06-04 LAB — BASIC METABOLIC PANEL WITH GFR
BUN/Creatinine Ratio: 15 (calc) (ref 6–22)
BUN: 20 mg/dL (ref 7–25)
CO2: 26 mmol/L (ref 20–32)
Calcium: 9.5 mg/dL (ref 8.6–10.4)
Chloride: 101 mmol/L (ref 98–110)
Creat: 1.35 mg/dL — ABNORMAL HIGH (ref 0.60–1.00)
Glucose, Bld: 114 mg/dL (ref 65–139)
Potassium: 3.8 mmol/L (ref 3.5–5.3)
Sodium: 136 mmol/L (ref 135–146)
eGFR: 42 mL/min/{1.73_m2} — ABNORMAL LOW (ref >=60)

## 2022-06-07 NOTE — Progress Notes (Signed)
CBC showed no anemia nor infectio BMP is same as previous, stable Urine culture showed no growth, no infection

## 2022-06-10 DIAGNOSIS — H04123 Dry eye syndrome of bilateral lacrimal glands: Secondary | ICD-10-CM | POA: Diagnosis not present

## 2022-07-06 ENCOUNTER — Other Ambulatory Visit: Payer: Self-pay | Admitting: Orthopedic Surgery

## 2022-07-06 MED ORDER — IBUPROFEN 800 MG PO TABS: 800.0000 mg | ORAL_TABLET | Freq: Three times a day (TID) | ORAL | 0 refills | Status: DC | PRN

## 2022-07-08 ENCOUNTER — Ambulatory Visit: Payer: Medicare Other | Admitting: Adult Health

## 2022-08-02 ENCOUNTER — Telehealth: Payer: Self-pay

## 2022-08-02 ENCOUNTER — Ambulatory Visit (INDEPENDENT_AMBULATORY_CARE_PROVIDER_SITE_OTHER): Payer: Medicare Other

## 2022-08-02 ENCOUNTER — Other Ambulatory Visit: Payer: Medicare Other

## 2022-08-02 ENCOUNTER — Ambulatory Visit (INDEPENDENT_AMBULATORY_CARE_PROVIDER_SITE_OTHER): Payer: Medicare Other | Admitting: Family

## 2022-08-02 ENCOUNTER — Other Ambulatory Visit: Payer: Self-pay

## 2022-08-02 DIAGNOSIS — G8929 Other chronic pain: Secondary | ICD-10-CM

## 2022-08-02 DIAGNOSIS — D649 Anemia, unspecified: Secondary | ICD-10-CM | POA: Diagnosis not present

## 2022-08-02 DIAGNOSIS — Z79899 Other long term (current) drug therapy: Secondary | ICD-10-CM | POA: Diagnosis not present

## 2022-08-02 DIAGNOSIS — E1122 Type 2 diabetes mellitus with diabetic chronic kidney disease: Secondary | ICD-10-CM | POA: Diagnosis not present

## 2022-08-02 DIAGNOSIS — N183 Chronic kidney disease, stage 3 unspecified: Secondary | ICD-10-CM

## 2022-08-02 DIAGNOSIS — E876 Hypokalemia: Secondary | ICD-10-CM

## 2022-08-02 DIAGNOSIS — S59911A Unspecified injury of right forearm, initial encounter: Secondary | ICD-10-CM | POA: Diagnosis not present

## 2022-08-02 DIAGNOSIS — M79671 Pain in right foot: Secondary | ICD-10-CM

## 2022-08-02 NOTE — Progress Notes (Signed)
Office Visit Note   Patient: [name] Long           Date of Birth: August 19, 1950           MRN: [id number] Visit Date: 08/02/2022              Requested by: Nickola Major, NP [address],  [place] [address] PCP: Nickola Major, NP  Chief Complaint  Patient presents with  . Right Arm - Pain  . Right Foot - Pain      HPI: The patient is a 72 year old woman who presents today complaining of 3 separate issues.  She complains of an injury to her right forearm as well as her right third and fourth fingers at the base.  Complaining of an CP joint pain and stiffness she is unable to characterize the injury that occurred but is concerned for fracture complaining of tenderness swelling and pain moving her arm and hand freely  She is also concerned about pain in her right foot which is chronic.  She has been having pain over the third metatarsal this is a chronic issue for her she is concerned that there is an issue with the implanted hardware in her right foot  Assessment & Plan: Visit Diagnoses:  1. Injury of lower arm, right, initial encounter   2. Chronic foot pain, right     Plan: Radiographs of her hand forearm and right foot performed today.  Follow-Up Instructions: No follow-ups on file.   Right Hand Exam   Tenderness  The patient is experiencing tenderness in the palmar area.  Muscle Strength  Grip: 3/5   Other  Sensation: none Pulse: present  Comments:  No bony tenderness of the hand or fingers.     Patient is alert, oriented, no adenopathy, well-dressed, normal affect, normal respiratory effort. Diffuse tenderness of the upper arm, full active range of motion of the elbow and wrist.  Stiffness in the fingers  On examination of right foot mild pitting edema to RLE. Tenderness diffusely along dorsum, over 3rd MT. Well healed surgical scar. No erythema, warmth or ecchymosis Imaging: No results found. No images are attached to the  encounter.  Labs: Lab Results  Component Value Date   HGBA1C 8.9 (H) 04/07/2022   HGBA1C 9.7 (H) 03/07/2022   HGBA1C 8.3 (H) 04/19/2021   REPTSTATUS 10/20/2021 FINAL 10/18/2021   CULT (A) 10/18/2021    <10,000 COLONIES/mL INSIGNIFICANT GROWTH Performed at [facility], [place] [address] [place], [place] [address]    LABORGA NO GROWTH 12/04/2014     Lab Results  Component Value Date   ALBUMIN 3.1 (L) 03/08/2022   ALBUMIN 3.4 (L) 03/07/2022   ALBUMIN 2.8 (L) 05/07/2021    Lab Results  Component Value Date   MG 2.0 03/18/2022   MG 2.0 03/12/2022   MG 2.0 03/07/2022   Lab Results  Component Value Date   VD25OH 43.0 02/23/2018   VD25OH 30 05/06/2015   VD25OH 12 (L) 12/18/2014    No results found for: "PREALBUMIN"    Latest Ref Rng & Units 06/03/2022    3:28 PM 04/07/2022   12:00 PM 04/03/2022    9:52 AM  CBC EXTENDED  WBC 3.8 - 10.8 Thousand/uL 5.5  4.9  5.0   RBC 3.80 - 5.10 Million/uL 3.76  3.38  3.35   Hemoglobin 11.7 - 15.5 g/dL 11.9  10.7  10.3   HCT 35.0 - 45.0 % 35.0  33.0  32.5  Platelets 140 - 400 Thousand/uL 233  162  166   NEUT# 1,500 - 7,800 cells/uL 2,398  2,171    Lymph# 850 - 3,900 cells/uL 2,178  1,779       There is no height or weight on file to calculate BMI.  Orders:  Orders Placed This Encounter  Procedures  . XR Foot 2 Views Right  . XR Hand Complete Right  . XR Forearm Right   No orders of the defined types were placed in this encounter.    Procedures: No procedures performed  Clinical Data: No additional findings.  ROS:  All other systems negative, except as noted in the HPI. Review of Systems  Objective: Vital Signs: There were no vitals taken for this visit.  Specialty Comments:  No specialty comments available.  PMFS History: Patient Active Problem List   Diagnosis Date Noted  . Schizoaffective disorder, bipolar type ([place]) 03/21/2022  . NSTEMI (non-ST elevated myocardial infarction) ([place]) 03/07/2022  . CKD  (chronic kidney disease) stage 3, GFR 30-59 ml/min ([facility]) 03/07/2022  . Chronic diastolic CHF (congestive heart failure) ([place]) 03/07/2022  . Dementia associated with other underlying disease with behavioral disturbance ([place]) 03/27/2020  . 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 Atrial flutter with rapid ventricular response ([place]) 07/10/2017  . Atrial fibrillation ([place]) 07/05/2017  . COPD (chronic obstructive pulmonary disease) ([place]) 01/22/2017  . Congestive heart failure (CHF) ([place])   . 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 Osteoarthritis, multiple sites 06/08/2011  . CHRONIC KIDNEY DISEASE STAGE II (MILD) 09/14/2009  . Hypothyroidism 08/31/2006  . HYPERTENSION, BENIGN SYSTEMIC 08/31/2006   Past Medical History:  Diagnosis Date  . Abscessed tooth 02/09/2020  . Acute on chronic congestive heart failure ([place])   . Adenomatous colon polyp   . Arthritis    "real bad; all over" (07/12/2017)  . Asthma   . Atrial fibrillation ([place])   . BIPOLAR DISORDER 08/31/2006   Qualifier: Diagnosis of  By: Dorathy Daft MD, Marjory Lies    . CHRONIC KIDNEY DISEASE STAGE II (MILD) 09/14/2009   Annotation: eGFR 64 Qualifier: Diagnosis of  By: Jess Barters MD, [name]    . Chronic lower back pain   . Congestive heart failure (CHF) ([place])   . COPD (chronic obstructive pulmonary disease) ([place]) 09/23/2010   Diagnosed at [facility] in 2008 (Dr. Annamaria Boots)   . DM (diabetes mellitus) type II controlled with renal manifestation ([place]) 08/31/2006   Qualifier: Diagnosis of  By: Dorathy Daft MD, Marjory Lies    . Dyspnea    "all my life; since 6th grade" (07/12/2017)  . Fatty liver   . HYPERCHOLESTEROLEMIA 08/31/2006   Intolerance to Lipitor OK on Crestor but medicaid no longer covering    . HYPERTENSION, BENIGN SYSTEMIC 08/31/2006   Qualifier: Diagnosis of  By: Dorathy Daft MD, Marjory Lies    . Hypokalemia   . HYPOTHYROIDISM, UNSPECIFIED 08/31/2006   Qualifier: Diagnosis of  By: Dorathy Daft MD, Marjory Lies    . Leg swelling  03/14/2018  . Pneumonia    "3 times" (07/12/2017)  . Pulmonary nodule   . Renal cyst   . Schizophrenia ([place])   . Scoliosis   . Stomach problems   . Thyroid disorder     Family History  Problem Relation Age of Onset  . Heart disease Mother 85  . Rectal cancer Mother   . Diabetes Mother   . High Cholesterol Mother   . Hypertension Mother   . Stroke Mother   . Diabetes Father 70  .  Hypertension Father   . Breast cancer Sister   . High Cholesterol Sister   . Hypertension Sister   . Breast cancer Sister   . Heart disease Brother   . High Cholesterol Brother   . Hypertension Brother   . Colon cancer Maternal Grandmother   . Asthma Daughter   . Asthma Daughter   . Asthma Son   . Stomach cancer Neg Hx   . Esophageal cancer Neg Hx   . Pancreatic cancer Neg Hx     Past Surgical History:  Procedure Laterality Date  . CESAREAN SECTION    . FOOT FRACTURE SURGERY Right    "steel plate in it"  . FOOT SURGERY     " born w/dislocated foot"  . FRACTURE SURGERY    . KNEE ARTHROSCOPY Right   . TOE SURGERY Bilateral    "both pinky toes"  . TONSILLECTOMY AND ADENOIDECTOMY     Social History   Occupational History  . Not on file  Tobacco Use  . Smoking status: Every Day    Packs/day: 1.00    Years: 45.00    Total pack years: 45.00    Types: Cigarettes  . Smokeless tobacco: Never  . Tobacco comments:    patient states she smokes 1 cigarette per month  Vaping Use  . Vaping Use: Never used  Substance and Sexual Activity  . Alcohol use: Not Currently    Comment: 07/12/2017 "stopped 2 yr ago; did have a drink over the holidays recently"  . Drug use: No  . Sexual activity: Not Currently
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
## 2022-08-03 ENCOUNTER — Encounter: Payer: Self-pay | Admitting: Family

## 2022-08-03 ENCOUNTER — Other Ambulatory Visit: Payer: Self-pay | Admitting: Adult Health

## 2022-08-03 ENCOUNTER — Telehealth: Payer: Self-pay

## 2022-08-03 DIAGNOSIS — E876 Hypokalemia: Secondary | ICD-10-CM

## 2022-08-03 LAB — COMPLETE METABOLIC PANEL WITH GFR
AG Ratio: 1.1 (calc) (ref 1.0–2.5)
ALT: 9 U/L (ref 6–29)
AST: 13 U/L (ref 10–35)
Albumin: 3.6 g/dL (ref 3.6–5.1)
Alkaline phosphatase (APISO): 68 U/L (ref 37–153)
BUN/Creatinine Ratio: 16 (calc) (ref 6–22)
BUN: 25 mg/dL (ref 7–25)
CO2: 19 mmol/L — ABNORMAL LOW (ref 20–32)
Calcium: 9.1 mg/dL (ref 8.6–10.4)
Chloride: 110 mmol/L (ref 98–110)
Creat: 1.52 mg/dL — ABNORMAL HIGH (ref 0.60–1.00)
Globulin: 3.4 g/dL (calc) (ref 1.9–3.7)
Glucose, Bld: 81 mg/dL (ref 65–139)
Potassium: 3 mmol/L — ABNORMAL LOW (ref 3.5–5.3)
Sodium: 140 mmol/L (ref 135–146)
Total Bilirubin: 0.2 mg/dL (ref 0.2–1.2)
Total Protein: 7 g/dL (ref 6.1–8.1)
eGFR: 36 mL/min/{1.73_m2} — ABNORMAL LOW (ref >=60)

## 2022-08-03 LAB — CBC WITH DIFFERENTIAL/PLATELET
Absolute Monocytes: 542 cells/uL (ref 200–950)
Basophils Absolute: 23 cells/uL (ref 0–200)
Basophils Relative: 0.4 %
Eosinophils Absolute: 120 cells/uL (ref 15–500)
Eosinophils Relative: 2.1 %
HCT: 33.6 % — ABNORMAL LOW (ref 35.0–45.0)
Hemoglobin: 11.5 g/dL — ABNORMAL LOW (ref 11.7–15.5)
Lymphs Abs: 1932 cells/uL (ref 850–3900)
MCH: 31.7 pg (ref 27.0–33.0)
MCHC: 34.2 g/dL (ref 32.0–36.0)
MCV: 92.6 fL (ref 80.0–100.0)
MPV: 11.2 fL (ref 7.5–12.5)
Monocytes Relative: 9.5 %
Neutro Abs: 3084 cells/uL (ref 1500–7800)
Neutrophils Relative %: 54.1 %
Platelets: 214 10*3/uL (ref 140–400)
RBC: 3.63 10*6/uL — ABNORMAL LOW (ref 3.80–5.10)
RDW: 17.4 % — ABNORMAL HIGH (ref 11.0–15.0)
Total Lymphocyte: 33.9 %
WBC: 5.7 10*3/uL (ref 3.8–10.8)

## 2022-08-03 LAB — HEMOGLOBIN A1C
Hgb A1c MFr Bld: 8.3 % of total Hgb — ABNORMAL HIGH (ref <5.7)
Mean Plasma Glucose: 192 mg/dL
eAG (mmol/L): 10.6 mmol/L

## 2022-08-03 MED ORDER — POTASSIUM CHLORIDE CRYS ER 20 MEQ PO TBCR: 40.0000 meq | EXTENDED_RELEASE_TABLET | Freq: Two times a day (BID) | ORAL | 0 refills | Status: DC

## 2022-08-03 NOTE — Telephone Encounter (Signed)
Below is Medina-Vargas, Monina C, NP response:   Medina-Vargas, Monina C, NP  You31 minutes ago (2:44 PM)    Noted and given orders.

## 2022-08-03 NOTE — Progress Notes (Signed)
K 3.0, low (normal 3.5 - 4.5) -  will need to take KCL ER 20 mew take 2 tabs = 40 meq X 2 doses only and re-check BMP on Friday, 08/05/22. -  A1c 8.3, improved from 8.9 (04/07/22) -  CBC is same as 3 months ago

## 2022-08-03 NOTE — Telephone Encounter (Signed)
Please review patient's lab results and send to [facility] clinical pool

## 2022-08-05 ENCOUNTER — Encounter: Payer: Medicare Other | Admitting: Adult Health

## 2022-08-05 NOTE — Progress Notes (Signed)
This encounter was created in error - please disregard.

## 2022-08-16 ENCOUNTER — Other Ambulatory Visit: Payer: Self-pay | Admitting: Family

## 2022-08-16 DIAGNOSIS — I10 Essential (primary) hypertension: Secondary | ICD-10-CM

## 2022-08-22 ENCOUNTER — Telehealth: Payer: Self-pay | Admitting: Orthopedic Surgery

## 2022-08-22 NOTE — Telephone Encounter (Signed)
Called pt to see what medication she is asking for. Someone answered the phone, then hung it up.

## 2022-08-22 NOTE — Telephone Encounter (Signed)
Patient states she need her medication called in patient wants it called in before 5 pm..please and thank you

## 2022-08-23 NOTE — Telephone Encounter (Signed)
Tried to call pt to find out what medication she is requesting and the line is busy. Will hold and try again.

## 2022-08-24 NOTE — Telephone Encounter (Signed)
Unable to reach pt line busy.

## 2022-08-25 ENCOUNTER — Telehealth: Payer: Self-pay

## 2022-08-25 NOTE — Telephone Encounter (Signed)
-----   Message from Billie Lade sent at 08/25/2022  8:45 AM EST ----- I just comfirmed with patient that she will not be coming back.  ----- Message ----- From: Logan Bores, CMA Sent: 08/10/2022  10:13 AM EST To: Billie Lade  Thanks  ----- Message ----- From: Billie Lade Sent: 08/10/2022   9:26 AM EST To: Logan Bores, CMA  I left voice mail for Ms. Swiatek to call me back using her alternate phone number. I will also send a message through my chart. Pat ----- Message ----- From: Logan Bores, Clyde: 08/10/2022   8:51 AM EST To: Jodie Echevaria; Billie Lade  This patient no-showed last week and needs to be rescheduled. I am not sure if you all have already made attempts to reach her to reschedule.  Thanks,  ~ CB ~

## 2022-08-26 ENCOUNTER — Telehealth: Payer: Self-pay | Admitting: Orthopedic Surgery

## 2022-08-26 MED ORDER — IBUPROFEN 800 MG PO TABS: 800.0000 mg | ORAL_TABLET | Freq: Three times a day (TID) | ORAL | 0 refills | Status: DC | PRN

## 2022-08-26 NOTE — Telephone Encounter (Signed)
Me and Autumn have called patient 3 times over two days to get in touch with her about this. Previous message only stated "needs medication refilled", we didn't know which one she needed.

## 2022-08-26 NOTE — Telephone Encounter (Signed)
Patient is having issues with pain and medication she would like a call from Dr. Sharol Given personally call [phone]

## 2022-08-26 NOTE — Telephone Encounter (Signed)
Pt wants a refill for advil '800mg'$ . Last filled 07/06/22, pt seen in office recently for arm pain

## 2022-09-22 ENCOUNTER — Other Ambulatory Visit: Payer: Self-pay | Admitting: Family

## 2022-10-04 ENCOUNTER — Other Ambulatory Visit: Payer: Self-pay

## 2022-10-04 ENCOUNTER — Emergency Department (HOSPITAL_COMMUNITY)
Admission: EM | Admit: 2022-10-04 | Discharge: 2022-10-05 | Disposition: A | Discharge status: Home / Self Care | Payer: Medicare Other | Attending: Emergency Medicine | Admitting: Emergency Medicine

## 2022-10-04 ENCOUNTER — Ambulatory Visit (INDEPENDENT_AMBULATORY_CARE_PROVIDER_SITE_OTHER)
Admission: EM | Admit: 2022-10-04 | Discharge: 2022-10-04 | Disposition: A | Discharge status: Other Acute Inpatient Hospital | Payer: Medicare Other | Source: Home / Self Care

## 2022-10-04 ENCOUNTER — Encounter (HOSPITAL_COMMUNITY): Payer: Self-pay

## 2022-10-04 DIAGNOSIS — Z91148 Patient's other noncompliance with medication regimen for other reason: Secondary | ICD-10-CM | POA: Insufficient documentation

## 2022-10-04 DIAGNOSIS — Z1152 Encounter for screening for COVID-19: Secondary | ICD-10-CM | POA: Diagnosis not present

## 2022-10-04 DIAGNOSIS — I252 Old myocardial infarction: Secondary | ICD-10-CM | POA: Insufficient documentation

## 2022-10-04 DIAGNOSIS — I129 Hypertensive chronic kidney disease with stage 1 through stage 4 chronic kidney disease, or unspecified chronic kidney disease: Secondary | ICD-10-CM | POA: Insufficient documentation

## 2022-10-04 DIAGNOSIS — F29 Unspecified psychosis not due to a substance or known physiological condition: Secondary | ICD-10-CM | POA: Diagnosis not present

## 2022-10-04 DIAGNOSIS — E1122 Type 2 diabetes mellitus with diabetic chronic kidney disease: Secondary | ICD-10-CM | POA: Insufficient documentation

## 2022-10-04 DIAGNOSIS — I10 Essential (primary) hypertension: Secondary | ICD-10-CM | POA: Diagnosis not present

## 2022-10-04 DIAGNOSIS — F209 Schizophrenia, unspecified: Secondary | ICD-10-CM

## 2022-10-04 DIAGNOSIS — J189 Pneumonia, unspecified organism: Secondary | ICD-10-CM | POA: Diagnosis not present

## 2022-10-04 DIAGNOSIS — F250 Schizoaffective disorder, bipolar type: Secondary | ICD-10-CM | POA: Insufficient documentation

## 2022-10-04 DIAGNOSIS — N189 Chronic kidney disease, unspecified: Secondary | ICD-10-CM | POA: Insufficient documentation

## 2022-10-04 DIAGNOSIS — R0602 Shortness of breath: Secondary | ICD-10-CM | POA: Diagnosis present

## 2022-10-04 DIAGNOSIS — Z91040 Latex allergy status: Secondary | ICD-10-CM | POA: Diagnosis not present

## 2022-10-04 LAB — CBC WITH DIFFERENTIAL/PLATELET
Abs Immature Granulocytes: 0 10*3/uL (ref 0.00–0.07)
Basophils Absolute: 0 10*3/uL (ref 0.0–0.1)
Basophils Relative: 1 %
Eosinophils Absolute: 0.1 10*3/uL (ref 0.0–0.5)
Eosinophils Relative: 3 %
HCT: 36.3 % (ref 36.0–46.0)
Hemoglobin: 12 g/dL (ref 12.0–15.0)
Immature Granulocytes: 0 %
Lymphocytes Relative: 40 %
Lymphs Abs: 1.9 10*3/uL (ref 0.7–4.0)
MCH: 32.5 pg (ref 26.0–34.0)
MCHC: 33.1 g/dL (ref 30.0–36.0)
MCV: 98.4 fL (ref 80.0–100.0)
Monocytes Absolute: 0.6 10*3/uL (ref 0.1–1.0)
Monocytes Relative: 13 %
Neutro Abs: 2.1 10*3/uL (ref 1.7–7.7)
Neutrophils Relative %: 43 %
Platelets: 235 10*3/uL (ref 150–400)
RBC: 3.69 MIL/uL — ABNORMAL LOW (ref 3.87–5.11)
RDW: 17 % — ABNORMAL HIGH (ref 11.5–15.5)
WBC: 4.8 10*3/uL (ref 4.0–10.5)
nRBC: 0 % (ref 0.0–0.2)

## 2022-10-04 LAB — COMPREHENSIVE METABOLIC PANEL
ALT: 13 U/L (ref 0–44)
AST: 20 U/L (ref 15–41)
Albumin: 3.3 g/dL — ABNORMAL LOW (ref 3.5–5.0)
Alkaline Phosphatase: 70 U/L (ref 38–126)
Anion gap: 12 (ref 5–15)
BUN: 16 mg/dL (ref 8–23)
CO2: 24 mmol/L (ref 22–32)
Calcium: 9.8 mg/dL (ref 8.9–10.3)
Chloride: 101 mmol/L (ref 98–111)
Creatinine, Ser: 1.46 mg/dL — ABNORMAL HIGH (ref 0.44–1.00)
GFR, Estimated: 38 mL/min — ABNORMAL LOW (ref >60)
Glucose, Bld: 89 mg/dL (ref 70–99)
Potassium: 3.6 mmol/L (ref 3.5–5.1)
Sodium: 137 mmol/L (ref 135–145)
Total Bilirubin: 0.3 mg/dL (ref 0.3–1.2)
Total Protein: 7.4 g/dL (ref 6.5–8.1)

## 2022-10-04 LAB — ETHANOL: Alcohol, Ethyl (B): <10 mg/dL (ref <10)

## 2022-10-04 LAB — SARS CORONAVIRUS 2 BY RT PCR: SARS Coronavirus 2 by RT PCR: NEGATIVE

## 2022-10-04 LAB — SALICYLATE LEVEL: Salicylate Lvl: <7 mg/dL — ABNORMAL LOW (ref 7.0–30.0)

## 2022-10-04 LAB — ACETAMINOPHEN LEVEL: Acetaminophen (Tylenol), Serum: <10 ug/mL — ABNORMAL LOW (ref 10–30)

## 2022-10-04 LAB — TSH: TSH: 0.987 u[IU]/mL (ref 0.350–4.500)

## 2022-10-04 MED ORDER — INSULIN ASPART 100 UNIT/ML IJ SOLN: 0.0000 [IU] | Freq: Three times a day (TID) | INTRAMUSCULAR | Status: DC
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
## 2022-10-04 NOTE — ED Notes (Signed)
Discharge instructions provided and Pt stated understanding. Pt alert, orient and ambulatory prior to d/c from facility. Personal belongings returned from the pink locker. [employer] arrived to provide transportation to [facility]. Report previously called to [facility]. Safety maintained.
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
## 2022-10-04 NOTE — ED Notes (Signed)
Patient refusing to get changed into burgundy scrubs. Security called for assistance
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
## 2022-10-04 NOTE — ED Notes (Signed)
Report called to [place] at [facility]. [employer] called for transportation services. Three copies of IVC paperwork and emtala placed in envelope for transfer.

## 2022-10-04 NOTE — Discharge Instructions (Signed)
Transfer to [facility] for medical Clearance. Patient is recommended for IP admission. Daugher Shelda Pal LEGAL GUARDIAN does not want patient to be held at [facility]. She request that patient be held in ed until a psychiatric bed becomes available.

## 2022-10-04 NOTE — ED Notes (Signed)
Patient changed into burgandy scrubs with the assistance of security.

## 2022-10-04 NOTE — Progress Notes (Addendum)
Pt was accepted to [place] 10/04/22; [place] PENDING Labs/ Pt will need to be COVID negative.  Pt meets inpatient criteria per Thomes Lolling, NP.   Attending Physician will be Dr. Leta Jungling  Report can be called [phone]-Pager number, please leave a returned phone number to receive a phone call back.   Pt can arrive after: BED IS READY NOW  Care Team Notified: Day [facility] AC Danika Marcelyn Bruins, NP, [address] LCSWA, Shelah Lewandowsky, RN  [place], [place] 10/04/2022 @ 5:54 PM

## 2022-10-04 NOTE — ED Notes (Signed)
1st Exam completed, original filed in [employer] with copies of Affidavit and Order; copy filed in medical records; 3 copies attached to clipboard in orange zone, copy faxed to [place]

## 2022-10-04 NOTE — ED Triage Notes (Signed)
Patient bib [employer] from [place]. [place] sent her to the ED for a medical clearance. She is reported to have renal problems and hypertension. [place] states patient was in a state of psychosis and talking out of her head. On arrival to the ED she states she is here for a physical and states she does her own physicals. Patient also stated it is October. A&Ox1.

## 2022-10-04 NOTE — Progress Notes (Signed)
LCSW Progress Note  [id number]   [name] Long  10/04/2022  4:25 PM  Description:   Inpatient Psychiatric Referral  Patient was recommended gero psych inpatient per Thomes Lolling, NP. There are no available beds at [facility]. Patient was referred to the following facilities:   Destination  Service Provider Address Phone Fax  [place]  [address] [place] [address] [place]  [facility]  [address] [place] [address] [phone]  [place]  [address] [place] [phone] [place]  [facility]  [address] [place] [address] [phone] [facility], [place] [address] [phone]  [facility]  [id number] [place] [address] [phone] [facility]  [address] [place], [place] [address] [phone] [place] [address] [phone] [facility]  [address] [place] [address] [phone]  [facility]  [address] [place] [address] [phone] [place]  [address] [place] [address] [phone] [place]  [address] [place] [address] [phone] [facility]  [address] [place] [address] [phone]  [facility]  [address] [place] [address] [phone] [facility]  [address], [place] [address] [phone] [facility]  [place], [place] [address] [place]  [facility]  [address], [place] [address] [phone] [place]  [address] [place] [address] [phone]  [facility]  [address], [place] [address] [phone] [facility]  [address] [place] [address] [phone] [facility]  [address] [place] [address] [phone] [facility]  [address], [place] [address] [phone]    Situation ongoing, CSW to continue following and update chart as more information becomes available.      Denna Haggard, Latanya Presser  10/04/2022 4:25 PM

## 2022-10-05 ENCOUNTER — Telehealth: Payer: Self-pay | Admitting: Surgery

## 2022-10-05 DIAGNOSIS — Z91148 Patient's other noncompliance with medication regimen for other reason: Secondary | ICD-10-CM | POA: Diagnosis not present

## 2022-10-05 DIAGNOSIS — K219 Gastro-esophageal reflux disease without esophagitis: Secondary | ICD-10-CM | POA: Diagnosis present

## 2022-10-05 DIAGNOSIS — I130 Hypertensive heart and chronic kidney disease with heart failure and stage 1 through stage 4 chronic kidney disease, or unspecified chronic kidney disease: Secondary | ICD-10-CM | POA: Diagnosis present

## 2022-10-05 DIAGNOSIS — N189 Chronic kidney disease, unspecified: Secondary | ICD-10-CM | POA: Diagnosis present

## 2022-10-05 DIAGNOSIS — I509 Heart failure, unspecified: Secondary | ICD-10-CM | POA: Diagnosis present

## 2022-10-05 DIAGNOSIS — I129 Hypertensive chronic kidney disease with stage 1 through stage 4 chronic kidney disease, or unspecified chronic kidney disease: Secondary | ICD-10-CM | POA: Diagnosis present

## 2022-10-05 DIAGNOSIS — J4489 Other specified chronic obstructive pulmonary disease: Secondary | ICD-10-CM | POA: Diagnosis present

## 2022-10-05 DIAGNOSIS — E119 Type 2 diabetes mellitus without complications: Secondary | ICD-10-CM | POA: Diagnosis not present

## 2022-10-05 DIAGNOSIS — R262 Difficulty in walking, not elsewhere classified: Secondary | ICD-10-CM | POA: Diagnosis present

## 2022-10-05 DIAGNOSIS — I4891 Unspecified atrial fibrillation: Secondary | ICD-10-CM | POA: Diagnosis present

## 2022-10-05 DIAGNOSIS — Z781 Physical restraint status: Secondary | ICD-10-CM | POA: Diagnosis not present

## 2022-10-05 DIAGNOSIS — E876 Hypokalemia: Secondary | ICD-10-CM | POA: Diagnosis present

## 2022-10-05 DIAGNOSIS — F17210 Nicotine dependence, cigarettes, uncomplicated: Secondary | ICD-10-CM | POA: Diagnosis present

## 2022-10-05 DIAGNOSIS — I2510 Atherosclerotic heart disease of native coronary artery without angina pectoris: Secondary | ICD-10-CM | POA: Diagnosis present

## 2022-10-05 DIAGNOSIS — Z794 Long term (current) use of insulin: Secondary | ICD-10-CM | POA: Diagnosis not present

## 2022-10-05 DIAGNOSIS — E782 Mixed hyperlipidemia: Secondary | ICD-10-CM | POA: Diagnosis present

## 2022-10-05 DIAGNOSIS — G3184 Mild cognitive impairment, so stated: Secondary | ICD-10-CM | POA: Diagnosis present

## 2022-10-05 DIAGNOSIS — I10 Essential (primary) hypertension: Secondary | ICD-10-CM | POA: Diagnosis not present

## 2022-10-05 DIAGNOSIS — F209 Schizophrenia, unspecified: Secondary | ICD-10-CM | POA: Diagnosis not present

## 2022-10-05 DIAGNOSIS — E1122 Type 2 diabetes mellitus with diabetic chronic kidney disease: Secondary | ICD-10-CM | POA: Diagnosis present

## 2022-10-05 DIAGNOSIS — Z638 Other specified problems related to primary support group: Secondary | ICD-10-CM | POA: Diagnosis not present

## 2022-10-05 DIAGNOSIS — F250 Schizoaffective disorder, bipolar type: Secondary | ICD-10-CM | POA: Diagnosis present

## 2022-10-05 DIAGNOSIS — E039 Hypothyroidism, unspecified: Secondary | ICD-10-CM | POA: Diagnosis present

## 2022-10-05 LAB — RAPID URINE DRUG SCREEN, HOSP PERFORMED
Amphetamines: NOT DETECTED
Barbiturates: NOT DETECTED
Benzodiazepines: NOT DETECTED
Cocaine: NOT DETECTED
Opiates: NOT DETECTED
Tetrahydrocannabinol: NOT DETECTED

## 2022-10-05 LAB — URINALYSIS, ROUTINE W REFLEX MICROSCOPIC
Bacteria, UA: NONE SEEN
Bilirubin Urine: NEGATIVE
Glucose, UA: 50 mg/dL — AB
Hgb urine dipstick: NEGATIVE
Ketones, ur: NEGATIVE mg/dL
Leukocytes,Ua: NEGATIVE
Nitrite: NEGATIVE
Protein, ur: 30 mg/dL — AB
Specific Gravity, Urine: 1.016 (ref 1.005–1.030)
pH: 5 (ref 5.0–8.0)

## 2022-10-05 LAB — CBG MONITORING, ED
Glucose-Capillary: 109 mg/dL — ABNORMAL HIGH (ref 70–99)
Glucose-Capillary: 119 mg/dL — ABNORMAL HIGH (ref 70–99)

## 2022-10-05 NOTE — ED Notes (Signed)
IVC'd 10/04/2022, exp 10/11/2022; docs in [place]

## 2022-10-05 NOTE — Progress Notes (Signed)
Pt Accepted to [facility] Once COVID-19 results are Confirmed   This CSW sent [place] negative COVID-19 results via EPIC and communicated with care team.  Benjaman Kindler, MSW, LCSWA 10/05/2022 1:07 AM

## 2022-10-05 NOTE — Telephone Encounter (Signed)
Addendum 10-05-22 1645 Received call from Shelda Pal patient's daughter and Legal Guardian of the patient. Daughter is concerned because she was not updated about the patient being discharged and transported to [facility] under IVC.  She has requested to speak with ED leadership.  Lamar Sprinkles ED Director has been updated.   Marland Kitchen

## 2022-10-05 NOTE — ED Provider Notes (Signed)
Emergency Medicine Observation Re-evaluation Note  [name] Long is a 72 y.o. female, seen on rounds today.  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  Physical Exam  BP 129/76 (BP Location: Right Arm)   Pulse 73   Temp 98.1 F (36.7 C) (Oral)   Resp 17   Ht 5\' 5"  (1.651 m)   Wt 68.9 kg   SpO2 100%   BMI 25.28 kg/m  Physical Exam General: No distress Cardiac: Regular rate and rhythm Lungs: No increased work of breathing Psych: Delusional  ED Course / MDM  EKG:EKG Interpretation  Date/Time:  Tuesday October 04 2022 18:45:21 EDT Ventricular Rate:  69 PR Interval:    QRS Duration: 104 QT Interval:  376 QTC Calculation: 402 R Axis:   63 Text Interpretation: Atrial fibrillation with premature ventricular or aberrantly conducted complexes Anteroseptal infarct , age undetermined ST & T wave abnormality, consider inferior ischemia Abnormal ECG When compared with ECG of 03-Apr-2022 16:11,  aflutter has replaced sinus Confirmed by Davonna Belling [id number]) on 10/04/2022 9:33:52 PM  I have reviewed the labs performed to date as well as medications administered while in observation.  Recent changes in the last 24 hours include none.  Plan  Current plan is for transfer to [facility].    [name], MD 10/05/22 [phone]

## 2022-10-06 DIAGNOSIS — E039 Hypothyroidism, unspecified: Secondary | ICD-10-CM | POA: Diagnosis not present

## 2022-10-06 DIAGNOSIS — E782 Mixed hyperlipidemia: Secondary | ICD-10-CM | POA: Diagnosis not present

## 2022-10-06 DIAGNOSIS — I10 Essential (primary) hypertension: Secondary | ICD-10-CM | POA: Diagnosis not present

## 2022-10-06 DIAGNOSIS — E119 Type 2 diabetes mellitus without complications: Secondary | ICD-10-CM | POA: Diagnosis not present

## 2022-10-07 DIAGNOSIS — E782 Mixed hyperlipidemia: Secondary | ICD-10-CM | POA: Diagnosis not present

## 2022-10-07 DIAGNOSIS — E119 Type 2 diabetes mellitus without complications: Secondary | ICD-10-CM | POA: Diagnosis not present

## 2022-10-07 DIAGNOSIS — F209 Schizophrenia, unspecified: Secondary | ICD-10-CM | POA: Diagnosis not present

## 2022-10-07 DIAGNOSIS — E039 Hypothyroidism, unspecified: Secondary | ICD-10-CM | POA: Diagnosis not present

## 2022-10-07 DIAGNOSIS — I10 Essential (primary) hypertension: Secondary | ICD-10-CM | POA: Diagnosis not present

## 2022-10-09 DIAGNOSIS — E119 Type 2 diabetes mellitus without complications: Secondary | ICD-10-CM | POA: Diagnosis not present

## 2022-10-10 ENCOUNTER — Telehealth: Payer: Self-pay

## 2022-10-10 NOTE — Telephone Encounter (Signed)
        Patient  visited [facility] on 4/3     Telephone encounter attempt :  1st  A HIPAA compliant voice message was left requesting a return call.  Instructed patient to call back     Ophia Shamoon [facility] Care Guide, [place] [phone] [address], [place], [place] [address] Phone: [phone] Email: [email or web address]       

## 2022-10-10 NOTE — Telephone Encounter (Signed)
        Patient  visited [place] on 4/3    Telephone encounter attempt :  2nd Unable to [employer]

## 2022-10-16 DIAGNOSIS — F209 Schizophrenia, unspecified: Secondary | ICD-10-CM | POA: Diagnosis not present

## 2022-10-22 DIAGNOSIS — F209 Schizophrenia, unspecified: Secondary | ICD-10-CM | POA: Diagnosis not present

## 2022-10-22 DIAGNOSIS — E119 Type 2 diabetes mellitus without complications: Secondary | ICD-10-CM | POA: Diagnosis not present

## 2022-10-23 DIAGNOSIS — F209 Schizophrenia, unspecified: Secondary | ICD-10-CM | POA: Diagnosis not present

## 2022-10-25 ENCOUNTER — Other Ambulatory Visit: Payer: Self-pay | Admitting: Family

## 2022-10-28 ENCOUNTER — Other Ambulatory Visit: Payer: Self-pay | Admitting: Family

## 2022-10-28 ENCOUNTER — Encounter: Payer: Medicare Other | Admitting: Adult Health

## 2022-10-28 ENCOUNTER — Other Ambulatory Visit: Payer: Self-pay | Admitting: Orthopedic Surgery

## 2022-11-02 ENCOUNTER — Encounter (HOSPITAL_COMMUNITY): Payer: Self-pay | Admitting: Emergency Medicine

## 2022-11-02 ENCOUNTER — Inpatient Hospital Stay (HOSPITAL_COMMUNITY)
Admission: EM | Admit: 2022-11-02 | Discharge: 2022-11-07 | DRG: 280 | Disposition: A | Discharge status: Home / Self Care | Payer: Medicare Other | Attending: Cardiovascular Disease | Admitting: Cardiovascular Disease

## 2022-11-02 ENCOUNTER — Other Ambulatory Visit: Payer: Self-pay

## 2022-11-02 ENCOUNTER — Emergency Department (HOSPITAL_COMMUNITY): Payer: Medicare Other

## 2022-11-02 ENCOUNTER — Ambulatory Visit (INDEPENDENT_AMBULATORY_CARE_PROVIDER_SITE_OTHER): Payer: Medicare Other | Admitting: Adult Health

## 2022-11-02 ENCOUNTER — Encounter: Payer: Self-pay | Admitting: Adult Health

## 2022-11-02 ENCOUNTER — Encounter: Payer: Medicare Other | Admitting: Adult Health

## 2022-11-02 VITALS — BP 122/88 | HR 73 | Temp 97.7°F | Resp 20 | Ht 65.0 in | Wt 147.4 lb

## 2022-11-02 DIAGNOSIS — E039 Hypothyroidism, unspecified: Secondary | ICD-10-CM

## 2022-11-02 DIAGNOSIS — R911 Solitary pulmonary nodule: Secondary | ICD-10-CM | POA: Diagnosis present

## 2022-11-02 DIAGNOSIS — Z91199 Patient's noncompliance with other medical treatment and regimen due to unspecified reason: Secondary | ICD-10-CM

## 2022-11-02 DIAGNOSIS — Z91148 Patient's other noncompliance with medication regimen for other reason: Secondary | ICD-10-CM

## 2022-11-02 DIAGNOSIS — N183 Chronic kidney disease, stage 3 unspecified: Secondary | ICD-10-CM | POA: Diagnosis not present

## 2022-11-02 DIAGNOSIS — F250 Schizoaffective disorder, bipolar type: Secondary | ICD-10-CM | POA: Diagnosis not present

## 2022-11-02 DIAGNOSIS — E1165 Type 2 diabetes mellitus with hyperglycemia: Secondary | ICD-10-CM | POA: Diagnosis present

## 2022-11-02 DIAGNOSIS — F02818 Dementia in other diseases classified elsewhere, unspecified severity, with other behavioral disturbance: Secondary | ICD-10-CM | POA: Diagnosis not present

## 2022-11-02 DIAGNOSIS — Z634 Disappearance and death of family member: Secondary | ICD-10-CM

## 2022-11-02 DIAGNOSIS — K219 Gastro-esophageal reflux disease without esophagitis: Secondary | ICD-10-CM | POA: Diagnosis present

## 2022-11-02 DIAGNOSIS — Z91013 Allergy to seafood: Secondary | ICD-10-CM

## 2022-11-02 DIAGNOSIS — I214 Non-ST elevation (NSTEMI) myocardial infarction: Secondary | ICD-10-CM | POA: Diagnosis not present

## 2022-11-02 DIAGNOSIS — R451 Restlessness and agitation: Secondary | ICD-10-CM | POA: Diagnosis not present

## 2022-11-02 DIAGNOSIS — I482 Chronic atrial fibrillation, unspecified: Secondary | ICD-10-CM | POA: Diagnosis not present

## 2022-11-02 DIAGNOSIS — R0603 Acute respiratory distress: Secondary | ICD-10-CM | POA: Diagnosis not present

## 2022-11-02 DIAGNOSIS — Z888 Allergy status to other drugs, medicaments and biological substances status: Secondary | ICD-10-CM

## 2022-11-02 DIAGNOSIS — Z886 Allergy status to analgesic agent status: Secondary | ICD-10-CM

## 2022-11-02 DIAGNOSIS — F17210 Nicotine dependence, cigarettes, uncomplicated: Secondary | ICD-10-CM | POA: Diagnosis present

## 2022-11-02 DIAGNOSIS — M545 Low back pain, unspecified: Secondary | ICD-10-CM | POA: Diagnosis present

## 2022-11-02 DIAGNOSIS — R143 Flatulence: Secondary | ICD-10-CM | POA: Diagnosis not present

## 2022-11-02 DIAGNOSIS — I5032 Chronic diastolic (congestive) heart failure: Secondary | ICD-10-CM

## 2022-11-02 DIAGNOSIS — I10 Essential (primary) hypertension: Secondary | ICD-10-CM

## 2022-11-02 DIAGNOSIS — Z83438 Family history of other disorder of lipoprotein metabolism and other lipidemia: Secondary | ICD-10-CM

## 2022-11-02 DIAGNOSIS — Z789 Other specified health status: Secondary | ICD-10-CM | POA: Diagnosis not present

## 2022-11-02 DIAGNOSIS — Z91040 Latex allergy status: Secondary | ICD-10-CM

## 2022-11-02 DIAGNOSIS — K760 Fatty (change of) liver, not elsewhere classified: Secondary | ICD-10-CM | POA: Diagnosis not present

## 2022-11-02 DIAGNOSIS — I249 Acute ischemic heart disease, unspecified: Secondary | ICD-10-CM | POA: Diagnosis not present

## 2022-11-02 DIAGNOSIS — E1122 Type 2 diabetes mellitus with diabetic chronic kidney disease: Secondary | ICD-10-CM

## 2022-11-02 DIAGNOSIS — M1990 Unspecified osteoarthritis, unspecified site: Secondary | ICD-10-CM | POA: Diagnosis present

## 2022-11-02 DIAGNOSIS — F0393 Unspecified dementia, unspecified severity, with mood disturbance: Secondary | ICD-10-CM | POA: Diagnosis not present

## 2022-11-02 DIAGNOSIS — E876 Hypokalemia: Secondary | ICD-10-CM | POA: Diagnosis present

## 2022-11-02 DIAGNOSIS — I130 Hypertensive heart and chronic kidney disease with heart failure and stage 1 through stage 4 chronic kidney disease, or unspecified chronic kidney disease: Secondary | ICD-10-CM | POA: Diagnosis not present

## 2022-11-02 DIAGNOSIS — N184 Chronic kidney disease, stage 4 (severe): Secondary | ICD-10-CM | POA: Diagnosis present

## 2022-11-02 DIAGNOSIS — R413 Other amnesia: Secondary | ICD-10-CM | POA: Diagnosis not present

## 2022-11-02 DIAGNOSIS — G8929 Other chronic pain: Secondary | ICD-10-CM | POA: Diagnosis present

## 2022-11-02 DIAGNOSIS — R4182 Altered mental status, unspecified: Secondary | ICD-10-CM | POA: Diagnosis not present

## 2022-11-02 DIAGNOSIS — E782 Mixed hyperlipidemia: Secondary | ICD-10-CM | POA: Diagnosis not present

## 2022-11-02 DIAGNOSIS — I5033 Acute on chronic diastolic (congestive) heart failure: Secondary | ICD-10-CM | POA: Diagnosis not present

## 2022-11-02 DIAGNOSIS — R0602 Shortness of breath: Secondary | ICD-10-CM | POA: Diagnosis not present

## 2022-11-02 DIAGNOSIS — Z91048 Other nonmedicinal substance allergy status: Secondary | ICD-10-CM

## 2022-11-02 DIAGNOSIS — E1140 Type 2 diabetes mellitus with diabetic neuropathy, unspecified: Secondary | ICD-10-CM | POA: Diagnosis not present

## 2022-11-02 DIAGNOSIS — M170 Bilateral primary osteoarthritis of knee: Secondary | ICD-10-CM

## 2022-11-02 DIAGNOSIS — Z885 Allergy status to narcotic agent status: Secondary | ICD-10-CM

## 2022-11-02 DIAGNOSIS — N1832 Chronic kidney disease, stage 3b: Secondary | ICD-10-CM | POA: Diagnosis not present

## 2022-11-02 DIAGNOSIS — E119 Type 2 diabetes mellitus without complications: Secondary | ICD-10-CM | POA: Diagnosis not present

## 2022-11-02 DIAGNOSIS — Z743 Need for continuous supervision: Secondary | ICD-10-CM | POA: Diagnosis not present

## 2022-11-02 DIAGNOSIS — Z91018 Allergy to other foods: Secondary | ICD-10-CM

## 2022-11-02 DIAGNOSIS — J4489 Other specified chronic obstructive pulmonary disease: Secondary | ICD-10-CM | POA: Diagnosis not present

## 2022-11-02 DIAGNOSIS — I5021 Acute systolic (congestive) heart failure: Secondary | ICD-10-CM | POA: Diagnosis not present

## 2022-11-02 DIAGNOSIS — J9811 Atelectasis: Secondary | ICD-10-CM | POA: Diagnosis not present

## 2022-11-02 DIAGNOSIS — R569 Unspecified convulsions: Secondary | ICD-10-CM | POA: Diagnosis not present

## 2022-11-02 DIAGNOSIS — I499 Cardiac arrhythmia, unspecified: Secondary | ICD-10-CM | POA: Diagnosis not present

## 2022-11-02 DIAGNOSIS — E7800 Pure hypercholesterolemia, unspecified: Secondary | ICD-10-CM | POA: Diagnosis present

## 2022-11-02 DIAGNOSIS — R0689 Other abnormalities of breathing: Secondary | ICD-10-CM | POA: Diagnosis not present

## 2022-11-02 DIAGNOSIS — I700 Atherosclerosis of aorta: Secondary | ICD-10-CM | POA: Diagnosis not present

## 2022-11-02 DIAGNOSIS — R000 Tachycardia, unspecified: Secondary | ICD-10-CM | POA: Diagnosis not present

## 2022-11-02 DIAGNOSIS — R059 Cough, unspecified: Secondary | ICD-10-CM | POA: Diagnosis not present

## 2022-11-02 DIAGNOSIS — Z8249 Family history of ischemic heart disease and other diseases of the circulatory system: Secondary | ICD-10-CM

## 2022-11-02 DIAGNOSIS — Z833 Family history of diabetes mellitus: Secondary | ICD-10-CM

## 2022-11-02 DIAGNOSIS — Z85118 Personal history of other malignant neoplasm of bronchus and lung: Secondary | ICD-10-CM

## 2022-11-02 DIAGNOSIS — Z602 Problems related to living alone: Secondary | ICD-10-CM | POA: Diagnosis present

## 2022-11-02 LAB — I-STAT CHEM 8, ED
BUN: 15 mg/dL (ref 8–23)
Calcium, Ion: 1.09 mmol/L — ABNORMAL LOW (ref 1.15–1.40)
Chloride: 104 mmol/L (ref 98–111)
Creatinine, Ser: 1.5 mg/dL — ABNORMAL HIGH (ref 0.44–1.00)
Glucose, Bld: 174 mg/dL — ABNORMAL HIGH (ref 70–99)
HCT: 34 % — ABNORMAL LOW (ref 36.0–46.0)
Hemoglobin: 11.6 g/dL — ABNORMAL LOW (ref 12.0–15.0)
Potassium: 3.2 mmol/L — ABNORMAL LOW (ref 3.5–5.1)
Sodium: 135 mmol/L (ref 135–145)
TCO2: 21 mmol/L — ABNORMAL LOW (ref 22–32)

## 2022-11-02 LAB — CBC WITH DIFFERENTIAL/PLATELET
Abs Immature Granulocytes: 0.04 10*3/uL (ref 0.00–0.07)
Basophils Absolute: 0 10*3/uL (ref 0.0–0.1)
Basophils Relative: 0 %
Eosinophils Absolute: 0 10*3/uL (ref 0.0–0.5)
Eosinophils Relative: 0 %
HCT: 29.7 % — ABNORMAL LOW (ref 36.0–46.0)
Hemoglobin: 10.1 g/dL — ABNORMAL LOW (ref 12.0–15.0)
Immature Granulocytes: 0 %
Lymphocytes Relative: 18 %
Lymphs Abs: 1.8 10*3/uL (ref 0.7–4.0)
MCH: 33.3 pg (ref 26.0–34.0)
MCHC: 34 g/dL (ref 30.0–36.0)
MCV: 98 fL (ref 80.0–100.0)
Monocytes Absolute: 1.8 10*3/uL — ABNORMAL HIGH (ref 0.1–1.0)
Monocytes Relative: 18 %
Neutro Abs: 6.5 10*3/uL (ref 1.7–7.7)
Neutrophils Relative %: 64 %
Platelets: 228 10*3/uL (ref 150–400)
RBC: 3.03 MIL/uL — ABNORMAL LOW (ref 3.87–5.11)
RDW: 17.2 % — ABNORMAL HIGH (ref 11.5–15.5)
WBC: 10.2 10*3/uL (ref 4.0–10.5)
nRBC: 0 % (ref 0.0–0.2)

## 2022-11-02 MED ORDER — VALPROIC ACID 250 MG PO CAPS: 250.0000 mg | ORAL_CAPSULE | Freq: Two times a day (BID) | ORAL | 3 refills | Status: DC

## 2022-11-02 MED ORDER — METOPROLOL SUCCINATE ER 50 MG PO TB24: 50.0000 mg | ORAL_TABLET | Freq: Every day | ORAL | 11 refills | Status: DC

## 2022-11-02 MED ORDER — OLANZAPINE 20 MG PO TABS: 20.0000 mg | ORAL_TABLET | Freq: Every day | ORAL | 3 refills | Status: DC

## 2022-11-02 NOTE — ED Triage Notes (Signed)
Patient BIB [facility] from home w/ [facility] that started today. Patient w/ hx of lung cancer/ stage 4 kidney disease,not on dialysis. Just recently started smoking again.Patient inspiratory and expiratory wheezes. Started on duoneb x2, 125 solumedrol given, and 2 grams of mag. Patient currently on neb treatment on arrival.   150/91 108 -120 HR  100% SpO2 RA

## 2022-11-02 NOTE — Progress Notes (Signed)
[facility] clinic  Provider:   Kenard Gower DNP  Code Status:  Full Code  Goals of Care:     11/02/2022   10:56 PM  Advanced Directives  Does Patient Have a Medical Advance Directive? No  Would patient like information on creating a medical advance directive? No - Patient declined     Chief Complaint  Patient presents with   Transitions Of Care    Hospital Follow-up from begin discharged from [facility] on 10/27/22     HPI: Patient is a 72 y.o. female seen today for follow up hospitalization. She was accompanied today by her daughter, Roque Lias. She was seen at [facility] urgent care on 10/04/22 and was brought to [facility] ED for health clearance. She had been hallucinating, not taking care of self. She had IVC by daughter due to schizophrenia. She was then transferred to [facility]. Patient called son while at [facility]. She was discharged back to home accompanied by a sheriff. Daughter stated that they did not send her any papers nor prescriptions. She has not had any psychotropic medications since she was discharged. She complained of having bilateral knee pain. She walks without an assistive device. Per daughter, she has a cane at home. She smokes a pack in 2 days.  Son, Tinnie Gens, was called by daughter and was on speaker during the visit. They wanted to know if patient has dementia. MMSE was done  in the clinic and scored 30/30. She is currently on Donepezil 5 mg daily which was started on October 27, 2022. Family is requesting neurology consult to know if she has dementia.                                                                                                                                                                                       Past Medical History:  Diagnosis Date   Abscessed tooth 02/09/2020   Acute on chronic congestive heart failure (HCC)    Adenomatous colon polyp    Arthritis    "real bad; all over" (07/12/2017)   Asthma    Atrial  fibrillation (HCC)    BIPOLAR DISORDER 08/31/2006   Qualifier: Diagnosis of  By: Irving Burton MD, Clifton Custard     CHRONIC KIDNEY DISEASE STAGE II (MILD) 09/14/2009   Annotation: eGFR 64 Qualifier: Diagnosis of  By: Louanne Belton MD, Rolm Gala     Chronic lower back pain    Congestive heart failure (CHF) (HCC)    COPD (chronic obstructive pulmonary disease) (HCC) 09/23/2010   Diagnosed at [facility] in 2008 (Dr. Maple Hudson)    DM (diabetes mellitus) type II controlled with renal manifestation (HCC) 08/31/2006  Qualifier: Diagnosis of  By: Irving Burton MD, Clifton Custard     Dyspnea    "all my life; since 6th grade" (07/12/2017)   Fatty liver    HYPERCHOLESTEROLEMIA 08/31/2006   Intolerance to Lipitor OK on Crestor but medicaid no longer covering     HYPERTENSION, BENIGN SYSTEMIC 08/31/2006   Qualifier: Diagnosis of  By: Irving Burton MD, Clifton Custard     Hypokalemia    HYPOTHYROIDISM, UNSPECIFIED 08/31/2006   Qualifier: Diagnosis of  By: Irving Burton MD, Clifton Custard     Leg swelling 03/14/2018   Pneumonia    "3 times" (07/12/2017)   Pulmonary nodule    Renal cyst    Schizophrenia (HCC)    Scoliosis    Stomach problems    Thyroid disorder     Past Surgical History:  Procedure Laterality Date   CESAREAN SECTION     FOOT FRACTURE SURGERY Right    "steel plate in it"   FOOT SURGERY     " born w/dislocated foot"   FRACTURE SURGERY     KNEE ARTHROSCOPY Right    TOE SURGERY Bilateral    "both pinky toes"   TONSILLECTOMY AND ADENOIDECTOMY      Allergies  Allergen Reactions   Citrus Anaphylaxis and Itching   Fish Allergy Anaphylaxis and Other (See Comments)   Shellfish Allergy Shortness Of Breath and Other (See Comments)    "Affects thyroid" also   Adhesive [Tape] Other (See Comments)    Must have paper tape only   Ibuprofen Swelling and Other (See Comments)    Face swells   Latex Dermatitis   Lipitor [Atorvastatin Calcium] Other (See Comments)    Body aches   Lisinopril Other (See Comments)    PER DR. Sherene Sires (not recalled by patient)    Other Nausea Only and Other (See Comments)    Collards (gas, too)   Codeine Rash   Tramadol Palpitations    Outpatient Encounter Medications as of 11/02/2022  Medication Sig   albuterol (VENTOLIN HFA) 108 (90 Base) MCG/ACT inhaler Inhale 2 puffs into the lungs 4 (four) times daily as needed for wheezing or shortness of breath. DX J44.9   amLODipine (NORVASC) 2.5 MG tablet Take 2.5 mg by mouth daily.   Blood Glucose Monitoring Suppl (FREESTYLE LITE) w/Device KIT 1 each by Does not apply route daily. Dx:E11.9   Blood Pressure Monitoring (BLOOD PRESSURE MONITOR/WRIST) KIT 1 Units by Does not apply route daily at 6 (six) AM.   dapagliflozin propanediol (FARXIGA) 10 MG TABS tablet Take 1 tablet (10 mg total) by mouth daily before breakfast.   donepezil (ARICEPT) 5 MG tablet Take 5 mg by mouth at bedtime.   glucose blood (FREESTYLE LITE) test strip Use as instructed   Lancets (FREESTYLE) lancets Use to check blood sugar once daily. Dx: E11.9   levothyroxine (SYNTHROID) 50 MCG tablet Take 50 mcg by mouth daily.   methocarbamol (ROBAXIN) 500 MG tablet TAKE 1 TABLET(500 MG) BY MOUTH THREE TIMES DAILY AS NEEDED FOR MUSCLE SPASMS   metoprolol succinate (TOPROL-XL) 50 MG 24 hr tablet Take 1 tablet (50 mg total) by mouth daily.   nitroGLYCERIN (NITROSTAT) 0.4 MG SL tablet Place 1 tablet (0.4 mg total) under the tongue as needed.   OLANZapine (ZYPREXA) 20 MG tablet Take 1 tablet (20 mg total) by mouth at bedtime.   omeprazole (PRILOSEC) 40 MG capsule Take 40 mg by mouth 2 (two) times daily.   potassium chloride (KLOR-CON) 10 MEQ tablet Take 10 mEq by mouth 2 (two) times daily.  potassium chloride (MICRO-K) 10 MEQ CR capsule Take 10 mEq by mouth every other day.   PRESCRIPTION MEDICATION Take 1 Dose by nebulization. Unknown nebulizer solution   rosuvastatin (CRESTOR) 40 MG tablet Take 1 tablet (40 mg total) by mouth daily.   sucralfate (CARAFATE) 1 GM/10ML suspension Take 1 g by mouth. Daughter stated  patient is taking this med but unsure of how she is taking it   TRADJENTA 5 MG TABS tablet Take 5 mg by mouth daily.   [DISCONTINUED] ibuprofen (ADVIL) 800 MG tablet TAKE 1 TABLET(800 MG) BY MOUTH EVERY 8 HOURS AS NEEDED   [DISCONTINUED] metoprolol succinate (TOPROL-XL) 50 MG 24 hr tablet Take 1 tablet (50 mg total) by mouth daily.   [DISCONTINUED] OLANZapine (ZYPREXA) 20 MG tablet Take 20 mg by mouth at bedtime.   [DISCONTINUED] valproic acid (DEPAKENE) 250 MG capsule Take 250 mg by mouth 2 (two) times daily.   valproic acid (DEPAKENE) 250 MG capsule Take 1 capsule (250 mg total) by mouth 2 (two) times daily.   No facility-administered encounter medications on file as of 11/02/2022.    Review of Systems:  Review of Systems  Constitutional:  Negative for appetite change, chills, fatigue and fever.  HENT:  Negative for congestion, hearing loss, rhinorrhea and sore throat.   Eyes: Negative.   Respiratory:  Negative for cough, shortness of breath and wheezing.   Cardiovascular:  Positive for leg swelling. Negative for chest pain and palpitations.  Gastrointestinal:  Negative for abdominal pain, constipation, diarrhea, nausea and vomiting.  Genitourinary:  Negative for dysuria.  Musculoskeletal:  Positive for arthralgias. Negative for back pain and myalgias.  Skin:  Negative for color change, rash and wound.  Neurological:  Negative for dizziness, weakness and headaches.  Psychiatric/Behavioral:  Positive for agitation, behavioral problems, decreased concentration, dysphoric mood and hallucinations. Negative for sleep disturbance. The patient is not nervous/anxious.     Health Maintenance  Topic Date Due   Zoster Vaccines- Shingrix (1 of 2) Never done   Lung Cancer Screening  Never done   FOOT EXAM  04/26/2018   COVID-19 Vaccine (3 - Pfizer risk series) 12/17/2019   Medicare Annual Wellness (AWV)  07/17/2020   Diabetic kidney evaluation - Urine ACR  03/23/2021   DTaP/Tdap/Td (3 - Td or  Tdap) 06/07/2021   OPHTHALMOLOGY EXAM  10/28/2021   LIPID PANEL  10/07/2022   COLONOSCOPY (Pts 45-54yrs Insurance coverage will need to be confirmed)  11/05/2022   HEMOGLOBIN A1C  01/31/2023   INFLUENZA VACCINE  02/02/2023   MAMMOGRAM  06/01/2023   Diabetic kidney evaluation - eGFR measurement  10/04/2023   Pneumonia Vaccine 54+ Years old  Completed   DEXA SCAN  Completed   Hepatitis C Screening  Completed   HPV VACCINES  Aged Out    Physical Exam: Vitals:   11/02/22 1443  BP: 122/88  Pulse: 73  Resp: 20  Temp: 97.7 F (36.5 C)  SpO2: 95%  Weight: 147 lb 6 oz (66.8 kg)  Height: 5\' 5"  (1.651 m)   Body mass index is 24.52 kg/m. Physical Exam Constitutional:      General: She is not in acute distress.    Appearance: Normal appearance.  HENT:     Head: Normocephalic and atraumatic.     Nose: Nose normal. No congestion.     Mouth/Throat:     Mouth: Mucous membranes are moist.  Eyes:     Conjunctiva/sclera: Conjunctivae normal.  Cardiovascular:     Rate and Rhythm: Normal rate  and regular rhythm.  Pulmonary:     Breath sounds: Rales present. No wheezing.  Abdominal:     General: Bowel sounds are normal.     Palpations: Abdomen is soft.  Musculoskeletal:        General: Tenderness present.     Cervical back: Normal range of motion.     Right lower leg: Edema present.     Left lower leg: Edema present.     Comments: Bilateral knee tenderness BLE 1+edema  Skin:    General: Skin is warm and dry.  Neurological:     Mental Status: She is alert and oriented to person, place, and time.  Psychiatric:     Comments: Conversing to herself      Labs reviewed: Basic Metabolic Panel: Recent Labs    03/07/22 1630 03/08/22 0915 03/12/22 0705 03/15/22 1014 03/18/22 0819 03/26/22 0737 04/07/22 1200 06/03/22 1528 08/02/22 1449 10/04/22 1733 10/04/22 1800  NA  --    < > 137   < > 135  --  140 136 140 137  --   K  --    < > 4.3   < > 4.2  --  4.1 3.8 3.0* 3.6  --    CL  --    < > 109   < > 101  --  105 101 110 101  --   CO2  --    < > 24   < > 25  --  28 26 19* 24  --   GLUCOSE  --    < > 210*   < > 143*  --  106* 114 81 89  --   BUN  --    < > 14   < > 17  --  15 20 25 16   --   CREATININE  --    < > 1.24*   < > 1.36*   < > 1.37* 1.35* 1.52* 1.46*  --   CALCIUM  --    < > 9.0   < > 9.4  --  8.7 9.5 9.1 9.8  --   MG 2.0  --  2.0  --  2.0  --   --   --   --   --   --   TSH 0.471  --   --   --   --   --  1.56  --   --   --  0.987   < > = values in this interval not displayed.   Liver Function Tests: Recent Labs    03/07/22 1047 03/08/22 0915 04/07/22 1200 08/02/22 1449 10/04/22 1733  AST 18 21 9* 13 20  ALT 15 14 9 9 13   ALKPHOS 72 76  --   --  70  BILITOT 0.3 <0.1* 0.1* 0.2 0.3  PROT 7.3 6.7 6.6 7.0 7.4  ALBUMIN 3.4* 3.1*  --   --  3.3*   No results for input(s): "LIPASE", "AMYLASE" in the last 8760 hours. No results for input(s): "AMMONIA" in the last 8760 hours. CBC: Recent Labs    08/02/22 1449 10/04/22 1733 11/02/22 1524  WBC 5.7 4.8 7.4  NEUTROABS 3,084 2.1 4,092  HGB 11.5* 12.0 11.0*  HCT 33.6* 36.3 33.5*  MCV 92.6 98.4 98.8  PLT 214 235 265   Lipid Panel: Recent Labs    03/08/22 0014 04/07/22 1200  CHOL 215* 147  HDL 43 50  LDLCALC 144* 79  TRIG 139 92  CHOLHDL 5.0 2.9  Lab Results  Component Value Date   HGBA1C 8.3 (H) 08/02/2022    Procedures since last visit: DG Chest Port 1 View  Result Date: 11/02/2022 CLINICAL DATA:  Cough.  Shortness of breath. EXAM: PORTABLE CHEST 1 VIEW COMPARISON:  Chest radiograph dated 03/07/2022. FINDINGS: Bibasilar atelectasis. No focal consolidation, pleural effusion, or pneumothorax. Stable cardiomegaly. Atherosclerotic calcification of the aortic arch. No acute osseous pathology. Degenerative changes of the spine and scoliosis. IMPRESSION: Bibasilar atelectasis. No focal consolidation. Electronically Signed   By: Elgie Collard M.D.   On: 11/02/2022 23:18     Assessment/Plan  1. Schizoaffective disorder, bipolar type (HCC) -  conversing to herself during the clinic visit - OLANZapine (ZYPREXA) 20 MG tablet; Take 1 tablet (20 mg total) by mouth at bedtime.  Dispense: 90 tablet; Refill: 3 - Ambulatory referral to Psychiatry - valproic acid (DEPAKENE) 250 MG capsule; Take 1 capsule (250 mg total) by mouth 2 (two) times daily.  Dispense: 60 capsule; Refill: 3  2. 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 - Ambulatory referral to Neurology  3. Chronic diastolic CHF (congestive heart failure) (HCC) -  referred to cardiology - potassium chloride (MICRO-K) 10 MEQ CR capsule; Take 10 mEq by mouth every other day. - metoprolol succinate (TOPROL-XL) 50 MG 24 hr tablet; Take 1 tablet (50 mg total) by mouth daily.  Dispense: 30 tablet; Refill: 11 - Complete Metabolic Panel with eGFR  4. Type 2 diabetes mellitus with stage 3 chronic kidney disease, without long-term current use of insulin, unspecified whether stage 3a or 3b CKD (HCC) Lab Results  Component Value Date   HGBA1C 8.3 (H) 08/02/2022   -  continue Tradjenta and Farxiga -  check CBG daily, log and bring to next appointment   5. Bilateral primary osteoarthritis of knee -  instructed to apply heating pad - Ambulatory referral to Orthopedics  6. Primary hypertension -  BP 122/88, stable - amLODipine (NORVASC) 2.5 MG tablet; Take 2.5 mg by mouth daily. - metoprolol succinate (TOPROL-XL) 50 MG 24 hr tablet; Take 1 tablet (50 mg total) by mouth daily.  Dispense: 30 tablet; Refill: 11  7. Hypothyroidism, unspecified type Lab Results  Component Value Date   TSH 0.987 10/04/2022   -  continue Levothyroxine  8. Stage 3b chronic kidney disease (HCC) Lab Results  Component Value Date   NA 135 11/02/2022   K 3.2 (L) 11/02/2022   CO2 24 10/04/2022    GLUCOSE 174 (H) 11/02/2022   BUN 15 11/02/2022   CREATININE 1.50 (H) 11/02/2022   CALCIUM 9.8 10/04/2022   EGFR 36 (L) 08/02/2022   GFRNONAA 38 (L) 10/04/2022   -  avoid nephrotoxics     Labs/tests ordered:  CBC, CMP and tsh   Next appt:  Visit date not found

## 2022-11-02 NOTE — ED Provider Notes (Signed)
[facility]-EMERGENCY DEPT [facility] Emergency Department Provider Note MRN:  [id number]  Arrival date & time: 11/03/22     Chief Complaint   Shortness of Breath   History of Present Illness   [name] Long is a 72 y.o. year-old female presents to the ED with chief complaint of SOB and cough.  Reports associated wheezing.  Hx of lung cancer and stage 4 CKD.  Reports subjective fevers today.  Has received 2 duonebs from EMS 2 grams mag and 125mg  of solumedrol.  History provided by patient.   Review of Systems  Pertinent positive and negative review of systems noted in HPI.    Physical Exam   Vitals:   11/02/22 2300 11/03/22 0100  BP: 106/64 108/71  Pulse: (!) 113 90  Resp: (!) 24 (!) 26  Temp:    SpO2: 100% 100%    CONSTITUTIONAL:  non toxic-appearing, NAD NEURO:  Alert and oriented x 3, CN 3-12 grossly intact EYES:  eyes equal and reactive ENT/NECK:  Supple, no stridor  CARDIO:  tachycardic, regular rhythm, appears well-perfused  PULM:  No respiratory distress, diffuse wheezing GI/GU:  non-distended, non tender MSK/SPINE:  No gross deformities, no edema, moves all extremities  SKIN:  no rash, atraumatic   *Additional and/or pertinent findings included in MDM below  Diagnostic and Interventional Summary    EKG Interpretation  Date/Time:  Thursday Nov 03 2022 00:24:14 EDT Ventricular Rate:  100 PR Interval:  124 QRS Duration: 88 QT Interval:  317 QTC Calculation: 409 R Axis:   53 Text Interpretation: Atrial fibrillation Anteroseptal infarct, old Repol abnrm, severe global ischemia (LM/MVD) Confirmed by Tilden Fossa [phone]) on 11/03/2022 12:26:49 AM       Labs Reviewed  CBC WITH DIFFERENTIAL/PLATELET - Abnormal; Notable for the following components:      Result Value   RBC 3.03 (*)    Hemoglobin 10.1 (*)    HCT 29.7 (*)    RDW 17.2 (*)    Monocytes Absolute 1.8 (*)    All other components within normal limits  COMPREHENSIVE METABOLIC PANEL -  Abnormal; Notable for the following components:   Sodium 134 (*)    Potassium 3.2 (*)    CO2 21 (*)    Glucose, Bld 181 (*)    Creatinine, Ser 1.41 (*)    Calcium 8.4 (*)    Albumin 2.8 (*)    GFR, Estimated 40 (*)    All other components within normal limits  BRAIN NATRIURETIC PEPTIDE - Abnormal; Notable for the following components:   B Natriuretic Peptide 583.7 (*)    All other components within normal limits  I-STAT CHEM 8, ED - Abnormal; Notable for the following components:   Potassium 3.2 (*)    Creatinine, Ser 1.50 (*)    Glucose, Bld 174 (*)    Calcium, Ion 1.09 (*)    TCO2 21 (*)    Hemoglobin 11.6 (*)    HCT 34.0 (*)    All other components within normal limits  TROPONIN I (HIGH SENSITIVITY) - Abnormal; Notable for the following components:   Troponin I (High Sensitivity) 1,113 (*)    All other components within normal limits  LIPID PANEL  BASIC METABOLIC PANEL  CBC  PROTIME-INR  HEPARIN LEVEL (UNFRACTIONATED)  I-STAT VENOUS BLOOD GAS, ED  TROPONIN I (HIGH SENSITIVITY)    DG Chest Port 1 View  Final Result      Medications  nitroGLYCERIN (NITROSTAT) SL tablet 0.4 mg (has no administration in time range)  acetaminophen (TYLENOL) tablet 650 mg (has no administration in time range)  ondansetron (ZOFRAN) injection 4 mg (has no administration in time range)  albuterol (VENTOLIN HFA) 108 (90 Base) MCG/ACT inhaler 2 puff (has no administration in time range)  amLODipine (NORVASC) tablet 2.5 mg (has no administration in time range)  dapagliflozin propanediol (FARXIGA) tablet 10 mg (has no administration in time range)  donepezil (ARICEPT) tablet 5 mg (has no administration in time range)  levothyroxine (SYNTHROID) tablet 50 mcg (has no administration in time range)  OLANZapine (ZYPREXA) tablet 20 mg (has no administration in time range)  pantoprazole (PROTONIX) EC tablet 40 mg (has no administration in time range)  potassium chloride (KLOR-CON) CR tablet 10 mEq (has  no administration in time range)  rosuvastatin (CRESTOR) tablet 40 mg (has no administration in time range)  sucralfate (CARAFATE) 1 GM/10ML suspension 1 g (has no administration in time range)  linagliptin (TRADJENTA) tablet 5 mg (has no administration in time range)  valproic acid (DEPAKENE) 250 MG capsule 250 mg (has no administration in time range)  heparin bolus via infusion 3,000 Units (has no administration in time range)  heparin ADULT infusion 100 units/mL (25000 units/235mL) (has no administration in time range)  LORazepam (ATIVAN) injection 1 mg (1 mg Intravenous Given 11/03/22 0034)  ibuprofen (ADVIL) tablet 400 mg (400 mg Oral Given 11/03/22 0042)  LORazepam (ATIVAN) injection 1 mg (1 mg Intravenous Given 11/03/22 0042)     Procedures  /  Critical Care .Critical Care  Performed by: Roxy Horseman, PA-C Authorized by: Roxy Horseman, PA-C   Critical care provider statement:    Critical care time (minutes):  77   Critical care was necessary to treat or prevent imminent or life-threatening deterioration of the following conditions:  Circulatory failure and respiratory failure   Critical care was time spent personally by me on the following activities:  Development of treatment plan with patient or surrogate, discussions with consultants, evaluation of patient's response to treatment, examination of patient, ordering and review of laboratory studies, ordering and review of radiographic studies, ordering and performing treatments and interventions, pulse oximetry, re-evaluation of patient's condition and review of old charts   ED Course and Medical Decision Making  I have reviewed the triage vital signs, the nursing notes, and pertinent available records from the EMR.  Social Determinants Affecting Complexity of Care: Patient has problems living alone.   ED Course:    Medical Decision Making Patient here with SOB and wheezing.  Received nebs, mag, and solumedrol from EMS.   Still wheezy here.  Will give additional neb.    Labs and imaging discussed below.  Patient became aggressive and was attempting to leave.  Patient's daughter, who is guardian, agrees with admission and given ativan IV to help calm her down.    Amount and/or Complexity of Data Reviewed Labs: ordered.    Details: Troponin 1113, I consulted with [name], who will manage BNP 583, but CXR looks ok Radiology: ordered and independent interpretation performed.    Details: cardiomegaly  Risk Prescription drug management. Decision regarding hospitalization.     Consultants: I consulted with [name], who recommends admission.  Appreciate him for admitting.   Treatment and Plan: Patient's exam and diagnostic results are concerning for ACS.  Feel that patient will need admission to the hospital for further treatment and evaluation.  Patient seen by and discussed with attending physician, Dr. Madilyn Hook, who agrees with plan.  Final Clinical Impressions(s) / ED Diagnoses  ICD-10-CM   1. ACS (acute coronary syndrome) [facility])  I24.9       ED Discharge Orders     None         Discharge Instructions Discussed with and Provided to Patient:   Discharge Instructions   None      Roxy Horseman, PA-C 11/03/22 0140    Tilden Fossa, MD 11/04/22 2246

## 2022-11-03 ENCOUNTER — Inpatient Hospital Stay (HOSPITAL_COMMUNITY): Payer: Medicare Other

## 2022-11-03 ENCOUNTER — Encounter (HOSPITAL_COMMUNITY): Payer: Self-pay | Admitting: Cardiovascular Disease

## 2022-11-03 DIAGNOSIS — E119 Type 2 diabetes mellitus without complications: Secondary | ICD-10-CM | POA: Diagnosis not present

## 2022-11-03 DIAGNOSIS — Z886 Allergy status to analgesic agent status: Secondary | ICD-10-CM | POA: Diagnosis not present

## 2022-11-03 DIAGNOSIS — R413 Other amnesia: Secondary | ICD-10-CM | POA: Diagnosis not present

## 2022-11-03 DIAGNOSIS — M545 Low back pain, unspecified: Secondary | ICD-10-CM | POA: Diagnosis present

## 2022-11-03 DIAGNOSIS — G8929 Other chronic pain: Secondary | ICD-10-CM | POA: Diagnosis present

## 2022-11-03 DIAGNOSIS — E7800 Pure hypercholesterolemia, unspecified: Secondary | ICD-10-CM | POA: Diagnosis present

## 2022-11-03 DIAGNOSIS — E1140 Type 2 diabetes mellitus with diabetic neuropathy, unspecified: Secondary | ICD-10-CM | POA: Diagnosis present

## 2022-11-03 DIAGNOSIS — I214 Non-ST elevation (NSTEMI) myocardial infarction: Secondary | ICD-10-CM | POA: Diagnosis present

## 2022-11-03 DIAGNOSIS — R451 Restlessness and agitation: Secondary | ICD-10-CM | POA: Diagnosis not present

## 2022-11-03 DIAGNOSIS — R911 Solitary pulmonary nodule: Secondary | ICD-10-CM | POA: Diagnosis present

## 2022-11-03 DIAGNOSIS — I130 Hypertensive heart and chronic kidney disease with heart failure and stage 1 through stage 4 chronic kidney disease, or unspecified chronic kidney disease: Secondary | ICD-10-CM | POA: Diagnosis present

## 2022-11-03 DIAGNOSIS — E876 Hypokalemia: Secondary | ICD-10-CM | POA: Diagnosis present

## 2022-11-03 DIAGNOSIS — I5021 Acute systolic (congestive) heart failure: Secondary | ICD-10-CM | POA: Diagnosis not present

## 2022-11-03 DIAGNOSIS — R143 Flatulence: Secondary | ICD-10-CM | POA: Diagnosis not present

## 2022-11-03 DIAGNOSIS — F0393 Unspecified dementia, unspecified severity, with mood disturbance: Secondary | ICD-10-CM | POA: Diagnosis present

## 2022-11-03 DIAGNOSIS — R4182 Altered mental status, unspecified: Secondary | ICD-10-CM | POA: Diagnosis not present

## 2022-11-03 DIAGNOSIS — I249 Acute ischemic heart disease, unspecified: Secondary | ICD-10-CM | POA: Diagnosis present

## 2022-11-03 DIAGNOSIS — I5033 Acute on chronic diastolic (congestive) heart failure: Secondary | ICD-10-CM | POA: Diagnosis present

## 2022-11-03 DIAGNOSIS — E039 Hypothyroidism, unspecified: Secondary | ICD-10-CM | POA: Diagnosis present

## 2022-11-03 DIAGNOSIS — N184 Chronic kidney disease, stage 4 (severe): Secondary | ICD-10-CM | POA: Diagnosis present

## 2022-11-03 DIAGNOSIS — K219 Gastro-esophageal reflux disease without esophagitis: Secondary | ICD-10-CM | POA: Diagnosis present

## 2022-11-03 DIAGNOSIS — F250 Schizoaffective disorder, bipolar type: Secondary | ICD-10-CM | POA: Diagnosis present

## 2022-11-03 DIAGNOSIS — E1122 Type 2 diabetes mellitus with diabetic chronic kidney disease: Secondary | ICD-10-CM | POA: Diagnosis present

## 2022-11-03 DIAGNOSIS — E782 Mixed hyperlipidemia: Secondary | ICD-10-CM | POA: Diagnosis not present

## 2022-11-03 DIAGNOSIS — E1165 Type 2 diabetes mellitus with hyperglycemia: Secondary | ICD-10-CM | POA: Diagnosis present

## 2022-11-03 DIAGNOSIS — F17210 Nicotine dependence, cigarettes, uncomplicated: Secondary | ICD-10-CM | POA: Diagnosis present

## 2022-11-03 DIAGNOSIS — K760 Fatty (change of) liver, not elsewhere classified: Secondary | ICD-10-CM | POA: Diagnosis present

## 2022-11-03 DIAGNOSIS — R0603 Acute respiratory distress: Secondary | ICD-10-CM | POA: Diagnosis not present

## 2022-11-03 DIAGNOSIS — J4489 Other specified chronic obstructive pulmonary disease: Secondary | ICD-10-CM | POA: Diagnosis present

## 2022-11-03 DIAGNOSIS — M1990 Unspecified osteoarthritis, unspecified site: Secondary | ICD-10-CM | POA: Diagnosis present

## 2022-11-03 DIAGNOSIS — I482 Chronic atrial fibrillation, unspecified: Secondary | ICD-10-CM | POA: Diagnosis present

## 2022-11-03 DIAGNOSIS — R569 Unspecified convulsions: Secondary | ICD-10-CM | POA: Diagnosis not present

## 2022-11-03 LAB — CBC
HCT: 30.9 % — ABNORMAL LOW (ref 36.0–46.0)
Hemoglobin: 10.5 g/dL — ABNORMAL LOW (ref 12.0–15.0)
MCH: 33 pg (ref 26.0–34.0)
MCHC: 34 g/dL (ref 30.0–36.0)
MCV: 97.2 fL (ref 80.0–100.0)
Platelets: 220 10*3/uL (ref 150–400)
RBC: 3.18 MIL/uL — ABNORMAL LOW (ref 3.87–5.11)
RDW: 17.1 % — ABNORMAL HIGH (ref 11.5–15.5)
WBC: 10 10*3/uL (ref 4.0–10.5)
nRBC: 0 % (ref 0.0–0.2)

## 2022-11-03 LAB — I-STAT VENOUS BLOOD GAS, ED
Acid-base deficit: 4 mmol/L — ABNORMAL HIGH (ref 0.0–2.0)
Bicarbonate: 23 mmol/L (ref 20.0–28.0)
Calcium, Ion: 1.2 mmol/L (ref 1.15–1.40)
HCT: 36 % (ref 36.0–46.0)
Hemoglobin: 12.2 g/dL (ref 12.0–15.0)
O2 Saturation: 51 %
Potassium: 3.5 mmol/L (ref 3.5–5.1)
Sodium: 136 mmol/L (ref 135–145)
TCO2: 24 mmol/L (ref 22–32)
pCO2, Ven: 48 mmHg (ref 44–60)
pH, Ven: 7.288 (ref 7.25–7.43)
pO2, Ven: 31 mmHg — CL (ref 32–45)

## 2022-11-03 LAB — ECHOCARDIOGRAM COMPLETE
Height: 65 in
S' Lateral: 3 cm
Weight: 2328.06 oz

## 2022-11-03 LAB — COMPLETE METABOLIC PANEL WITH GFR
AG Ratio: 1 (calc) (ref 1.0–2.5)
ALT: 26 U/L (ref 6–29)
AST: 18 U/L (ref 10–35)
Albumin: 3.4 g/dL — ABNORMAL LOW (ref 3.6–5.1)
Alkaline phosphatase (APISO): 95 U/L (ref 37–153)
BUN/Creatinine Ratio: 13 (calc) (ref 6–22)
BUN: 16 mg/dL (ref 7–25)
CO2: 23 mmol/L (ref 20–32)
Calcium: 8.9 mg/dL (ref 8.6–10.4)
Chloride: 104 mmol/L (ref 98–110)
Creat: 1.2 mg/dL — ABNORMAL HIGH (ref 0.60–1.00)
Globulin: 3.4 g/dL (calc) (ref 1.9–3.7)
Glucose, Bld: 95 mg/dL (ref 65–99)
Potassium: 3.6 mmol/L (ref 3.5–5.3)
Sodium: 136 mmol/L (ref 135–146)
Total Bilirubin: 0.2 mg/dL (ref 0.2–1.2)
Total Protein: 6.8 g/dL (ref 6.1–8.1)
eGFR: 48 mL/min/{1.73_m2} — ABNORMAL LOW (ref >=60)

## 2022-11-03 LAB — COMPREHENSIVE METABOLIC PANEL
ALT: 29 U/L (ref 0–44)
AST: 21 U/L (ref 15–41)
Albumin: 2.8 g/dL — ABNORMAL LOW (ref 3.5–5.0)
Alkaline Phosphatase: 76 U/L (ref 38–126)
Anion gap: 10 (ref 5–15)
BUN: 15 mg/dL (ref 8–23)
CO2: 21 mmol/L — ABNORMAL LOW (ref 22–32)
Calcium: 8.4 mg/dL — ABNORMAL LOW (ref 8.9–10.3)
Chloride: 103 mmol/L (ref 98–111)
Creatinine, Ser: 1.41 mg/dL — ABNORMAL HIGH (ref 0.44–1.00)
GFR, Estimated: 40 mL/min — ABNORMAL LOW (ref >60)
Glucose, Bld: 181 mg/dL — ABNORMAL HIGH (ref 70–99)
Potassium: 3.2 mmol/L — ABNORMAL LOW (ref 3.5–5.1)
Sodium: 134 mmol/L — ABNORMAL LOW (ref 135–145)
Total Bilirubin: 0.4 mg/dL (ref 0.3–1.2)
Total Protein: 6.7 g/dL (ref 6.5–8.1)

## 2022-11-03 LAB — BASIC METABOLIC PANEL
Anion gap: 11 (ref 5–15)
BUN: 15 mg/dL (ref 8–23)
CO2: 20 mmol/L — ABNORMAL LOW (ref 22–32)
Calcium: 8.3 mg/dL — ABNORMAL LOW (ref 8.9–10.3)
Chloride: 103 mmol/L (ref 98–111)
Creatinine, Ser: 1.37 mg/dL — ABNORMAL HIGH (ref 0.44–1.00)
GFR, Estimated: 41 mL/min — ABNORMAL LOW (ref >60)
Glucose, Bld: 243 mg/dL — ABNORMAL HIGH (ref 70–99)
Potassium: 3.7 mmol/L (ref 3.5–5.1)
Sodium: 134 mmol/L — ABNORMAL LOW (ref 135–145)

## 2022-11-03 LAB — CBC WITH DIFFERENTIAL/PLATELET
Absolute Monocytes: 1473 cells/uL — ABNORMAL HIGH (ref 200–950)
Basophils Absolute: 22 cells/uL (ref 0–200)
Basophils Relative: 0.3 %
Eosinophils Absolute: 104 cells/uL (ref 15–500)
Eosinophils Relative: 1.4 %
HCT: 33.5 % — ABNORMAL LOW (ref 35.0–45.0)
Hemoglobin: 11 g/dL — ABNORMAL LOW (ref 11.7–15.5)
Lymphs Abs: 1709 cells/uL (ref 850–3900)
MCH: 32.4 pg (ref 27.0–33.0)
MCHC: 32.8 g/dL (ref 32.0–36.0)
MCV: 98.8 fL (ref 80.0–100.0)
MPV: 11.4 fL (ref 7.5–12.5)
Monocytes Relative: 19.9 %
Neutro Abs: 4092 cells/uL (ref 1500–7800)
Neutrophils Relative %: 55.3 %
Platelets: 265 10*3/uL (ref 140–400)
RBC: 3.39 10*6/uL — ABNORMAL LOW (ref 3.80–5.10)
RDW: 15.8 % — ABNORMAL HIGH (ref 11.0–15.0)
Total Lymphocyte: 23.1 %
WBC: 7.4 10*3/uL (ref 3.8–10.8)

## 2022-11-03 LAB — PROTIME-INR
INR: 1 (ref 0.8–1.2)
Prothrombin Time: 13.4 seconds (ref 11.4–15.2)

## 2022-11-03 LAB — LIPID PANEL
Cholesterol: 121 mg/dL (ref 0–200)
HDL: 43 mg/dL (ref >40)
LDL Cholesterol: 69 mg/dL (ref 0–99)
Total CHOL/HDL Ratio: 2.8 RATIO
Triglycerides: 43 mg/dL (ref <150)
VLDL: 9 mg/dL (ref 0–40)

## 2022-11-03 LAB — TROPONIN I (HIGH SENSITIVITY)
Troponin I (High Sensitivity): 1047 ng/L (ref <18)
Troponin I (High Sensitivity): 1113 ng/L (ref <18)

## 2022-11-03 LAB — TSH: TSH: 1.02 mIU/L (ref 0.40–4.50)

## 2022-11-03 LAB — BRAIN NATRIURETIC PEPTIDE: B Natriuretic Peptide: 583.7 pg/mL — ABNORMAL HIGH (ref 0.0–100.0)

## 2022-11-03 MED ORDER — AMLODIPINE BESYLATE 2.5 MG PO TABS
2.5000 mg | ORAL_TABLET | Freq: Every day | ORAL | Status: DC
  Administered 2022-11-04 – 2022-11-07 (×4): 2.5 mg via ORAL
  Filled 2022-11-03 (×4): qty 1

## 2022-11-03 MED ORDER — ENOXAPARIN SODIUM 80 MG/0.8ML IJ SOSY
70.0000 mg | PREFILLED_SYRINGE | INTRAMUSCULAR | Status: AC
  Administered 2022-11-03: 70 mg via SUBCUTANEOUS
  Filled 2022-11-03: qty 0.8

## 2022-11-03 MED ORDER — SUCRALFATE 1 GM/10ML PO SUSP
1.0000 g | Freq: Two times a day (BID) | ORAL | Status: DC
  Administered 2022-11-03 – 2022-11-07 (×8): 1 g via ORAL
  Filled 2022-11-03 (×8): qty 10

## 2022-11-03 MED ORDER — FUROSEMIDE 10 MG/ML IJ SOLN
80.0000 mg | Freq: Once | INTRAMUSCULAR | Status: AC
  Administered 2022-11-03: 80 mg via INTRAVENOUS
  Filled 2022-11-03: qty 8

## 2022-11-03 MED ORDER — HEPARIN (PORCINE) 25000 UT/250ML-% IV SOLN
800.0000 [IU]/h | INTRAVENOUS | Status: DC
  Administered 2022-11-03: 800 [IU]/h via INTRAVENOUS
  Filled 2022-11-03: qty 250

## 2022-11-03 MED ORDER — LINAGLIPTIN 5 MG PO TABS
5.0000 mg | ORAL_TABLET | Freq: Every day | ORAL | Status: DC
  Administered 2022-11-04 – 2022-11-07 (×4): 5 mg via ORAL
  Filled 2022-11-03 (×4): qty 1

## 2022-11-03 MED ORDER — LORAZEPAM 2 MG/ML IJ SOLN
1.0000 mg | Freq: Once | INTRAMUSCULAR | Status: AC
  Administered 2022-11-03: 1 mg via INTRAVENOUS

## 2022-11-03 MED ORDER — FENTANYL CITRATE PF 50 MCG/ML IJ SOSY: 25.0000 ug | PREFILLED_SYRINGE | Freq: Once | INTRAMUSCULAR | Status: DC

## 2022-11-03 MED ORDER — IBUPROFEN 400 MG PO TABS
400.0000 mg | ORAL_TABLET | Freq: Once | ORAL | Status: AC
  Administered 2022-11-03: 400 mg via ORAL
  Filled 2022-11-03: qty 1

## 2022-11-03 MED ORDER — PANTOPRAZOLE SODIUM 40 MG PO TBEC
40.0000 mg | DELAYED_RELEASE_TABLET | Freq: Every day | ORAL | Status: DC
  Administered 2022-11-04 – 2022-11-07 (×4): 40 mg via ORAL
  Filled 2022-11-03 (×4): qty 1

## 2022-11-03 MED ORDER — ONDANSETRON HCL 4 MG/2ML IJ SOLN: 4.0000 mg | Freq: Four times a day (QID) | INTRAMUSCULAR | Status: DC | PRN

## 2022-11-03 MED ORDER — OLANZAPINE 10 MG PO TABS
20.0000 mg | ORAL_TABLET | Freq: Every day | ORAL | Status: DC
  Administered 2022-11-03: 20 mg via ORAL
  Filled 2022-11-03 (×3): qty 2

## 2022-11-03 MED ORDER — ALBUTEROL SULFATE (2.5 MG/3ML) 0.083% IN NEBU
3.0000 mL | INHALATION_SOLUTION | Freq: Four times a day (QID) | RESPIRATORY_TRACT | Status: DC | PRN
  Administered 2022-11-03 – 2022-11-07 (×9): 3 mL via RESPIRATORY_TRACT
  Filled 2022-11-03 (×11): qty 3

## 2022-11-03 MED ORDER — DONEPEZIL HCL 5 MG PO TABS
5.0000 mg | ORAL_TABLET | Freq: Every day | ORAL | Status: DC
  Administered 2022-11-03: 5 mg via ORAL
  Filled 2022-11-03 (×2): qty 1

## 2022-11-03 MED ORDER — LORAZEPAM 2 MG/ML IJ SOLN: 1.0000 mg | Freq: Once | INTRAMUSCULAR | Status: DC

## 2022-11-03 MED ORDER — HALOPERIDOL LACTATE 5 MG/ML IJ SOLN
INTRAMUSCULAR | Status: AC
  Administered 2022-11-03: 2 mg via INTRAMUSCULAR
  Filled 2022-11-03: qty 1

## 2022-11-03 MED ORDER — HALOPERIDOL LACTATE 5 MG/ML IJ SOLN: 2.0000 mg | Freq: Once | INTRAMUSCULAR | Status: AC

## 2022-11-03 MED ORDER — IPRATROPIUM-ALBUTEROL 0.5-2.5 (3) MG/3ML IN SOLN
3.0000 mL | Freq: Once | RESPIRATORY_TRACT | Status: AC
  Administered 2022-11-03: 3 mL via RESPIRATORY_TRACT
  Filled 2022-11-03: qty 3

## 2022-11-03 MED ORDER — NITROGLYCERIN 0.4 MG SL SUBL: 0.4000 mg | SUBLINGUAL_TABLET | SUBLINGUAL | Status: DC | PRN

## 2022-11-03 MED ORDER — VALPROIC ACID 250 MG PO CAPS
250.0000 mg | ORAL_CAPSULE | Freq: Two times a day (BID) | ORAL | Status: DC
  Administered 2022-11-03 – 2022-11-07 (×7): 250 mg via ORAL
  Filled 2022-11-03 (×10): qty 1

## 2022-11-03 MED ORDER — LORAZEPAM 2 MG/ML IJ SOLN
1.0000 mg | Freq: Once | INTRAMUSCULAR | Status: AC
  Administered 2022-11-03: 1 mg via INTRAVENOUS
  Filled 2022-11-03: qty 1

## 2022-11-03 MED ORDER — POTASSIUM CHLORIDE CRYS ER 10 MEQ PO TBCR
10.0000 meq | EXTENDED_RELEASE_TABLET | Freq: Three times a day (TID) | ORAL | Status: DC
  Administered 2022-11-03 – 2022-11-07 (×12): 10 meq via ORAL
  Filled 2022-11-03 (×13): qty 1

## 2022-11-03 MED ORDER — DAPAGLIFLOZIN PROPANEDIOL 5 MG PO TABS
5.0000 mg | ORAL_TABLET | Freq: Every day | ORAL | Status: DC
  Administered 2022-11-04 – 2022-11-07 (×3): 5 mg via ORAL
  Filled 2022-11-03 (×6): qty 1

## 2022-11-03 MED ORDER — ACETAMINOPHEN 325 MG PO TABS
650.0000 mg | ORAL_TABLET | ORAL | Status: DC | PRN
  Administered 2022-11-03 – 2022-11-04 (×5): 650 mg via ORAL
  Filled 2022-11-03 (×5): qty 2

## 2022-11-03 MED ORDER — ROSUVASTATIN CALCIUM 20 MG PO TABS
40.0000 mg | ORAL_TABLET | Freq: Every day | ORAL | Status: DC
  Administered 2022-11-04 – 2022-11-07 (×4): 40 mg via ORAL
  Filled 2022-11-03 (×4): qty 2

## 2022-11-03 MED ORDER — POTASSIUM CHLORIDE 10 MEQ/100ML IV SOLN
10.0000 meq | Freq: Once | INTRAVENOUS | Status: AC
  Administered 2022-11-03: 10 meq via INTRAVENOUS
  Filled 2022-11-03: qty 100

## 2022-11-03 MED ORDER — LEVOTHYROXINE SODIUM 50 MCG PO TABS
50.0000 ug | ORAL_TABLET | Freq: Every day | ORAL | Status: DC
  Administered 2022-11-04 – 2022-11-07 (×4): 50 ug via ORAL
  Filled 2022-11-03 (×4): qty 1

## 2022-11-03 MED ORDER — LORAZEPAM 0.5 MG PO TABS
0.5000 mg | ORAL_TABLET | Freq: Three times a day (TID) | ORAL | Status: DC
  Administered 2022-11-03 – 2022-11-07 (×8): 0.5 mg via ORAL
  Filled 2022-11-03 (×10): qty 1

## 2022-11-03 MED ORDER — HEPARIN BOLUS VIA INFUSION
3000.0000 [IU] | Freq: Once | INTRAVENOUS | Status: AC
  Administered 2022-11-03: 3000 [IU] via INTRAVENOUS
  Filled 2022-11-03: qty 3000

## 2022-11-03 NOTE — ED Notes (Addendum)
Patient became agitated, ripped off BiPap mask and attempting to get out of bed, yelling and not able to follow direction. Family requested time to try to calm patient down before administering IM Haldol. Family attempts to calm patient down unsuccessful. This RN administered IM Haldol. RN changed patients brief and linens due to incontinence.  Family remains at bedside.

## 2022-11-03 NOTE — ED Notes (Signed)
ED TO INPATIENT HANDOFF REPORT  ED Nurse Name and Phone #:   S Name/Age/Gender [name] Long 72 y.o. female Room/Bed: 018C/018C  Code Status   Code Status: Full Code  Home/SNF/Other Home Patient oriented to: self, place, time, and situation Is this baseline? Yes   Triage Complete: Triage complete  Chief Complaint Acute coronary syndrome [facility]) [I24.9]  Triage Note Patient BIB [facility] from home w/ [facility] that started today. Patient w/ hx of lung cancer/ stage 4 kidney disease,not on dialysis. Just recently started smoking again.Patient inspiratory and expiratory wheezes. Started on duoneb x2, 125 solumedrol given, and 2 grams of mag. Patient currently on neb treatment on arrival.   150/91 108 -120 HR  100% SpO2 RA   Allergies Allergies  Allergen Reactions   Citrus Anaphylaxis and Itching   Fish Allergy Anaphylaxis and Other (See Comments)   Shellfish Allergy Shortness Of Breath and Other (See Comments)    "Affects thyroid" also   Adhesive [Tape] Other (See Comments)    Must have paper tape only   Ibuprofen Swelling and Other (See Comments)    Face swells   Latex Dermatitis   Lipitor [Atorvastatin Calcium] Other (See Comments)    Body aches   Lisinopril Other (See Comments)    PER DR. Sherene Sires (not recalled by patient)   Other Nausea Only and Other (See Comments)    Collards (gas, too)   Codeine Rash   Tramadol Palpitations    Level of Care/Admitting Diagnosis ED Disposition     ED Disposition  Admit   Condition  --   Comment  Hospital Area: [facility] [[id number]]  Level of Care: Progressive [102]  Admit to Progressive based on following criteria: CARDIOVASCULAR & THORACIC of moderate stability with acute coronary syndrome symptoms/low risk myocardial infarction/hypertensive urgency/arrhythmias/heart failure potentially compromising stability and stable post cardiovascular intervention patients.  May admit patient to [facility] or [facility] if  equivalent level of care is available:: No  Covid Evaluation: Asymptomatic - no recent exposure (last 10 days) testing not required  Diagnosis: Acute coronary syndrome [facility]) [[id number]]  Admitting Physician: Orpah Cobb [[id number]]  Attending Physician: Orpah Cobb [[id number]]  Certification:: I certify this patient will need inpatient services for at least 2 midnights  Estimated Length of Stay: 3          B Medical/Surgery History Past Medical History:  Diagnosis Date   Abscessed tooth 02/09/2020   Acute on chronic congestive heart failure (HCC)    Adenomatous colon polyp    Arthritis    "real bad; all over" (07/12/2017)   Asthma    Atrial fibrillation (HCC)    BIPOLAR DISORDER 08/31/2006   Qualifier: Diagnosis of  By: Irving Burton MD, Clifton Custard     CHRONIC KIDNEY DISEASE STAGE II (MILD) 09/14/2009   Annotation: eGFR 64 Qualifier: Diagnosis of  By: Louanne Belton MD, Erik     Chronic lower back pain    Congestive heart failure (CHF) (HCC)    COPD (chronic obstructive pulmonary disease) (HCC) 09/23/2010   Diagnosed at [facility] in 2008 (Dr. Maple Hudson)    DM (diabetes mellitus) type II controlled with renal manifestation (HCC) 08/31/2006   Qualifier: Diagnosis of  By: Irving Burton MD, Clifton Custard     Dyspnea    "all my life; since 6th grade" (07/12/2017)   Fatty liver    HYPERCHOLESTEROLEMIA 08/31/2006   Intolerance to Lipitor OK on Crestor but medicaid no longer covering     HYPERTENSION, BENIGN SYSTEMIC 08/31/2006   Qualifier:  Diagnosis of  By: Irving Burton MD, Clifton Custard     Hypokalemia    HYPOTHYROIDISM, UNSPECIFIED 08/31/2006   Qualifier: Diagnosis of  By: Irving Burton MD, Clifton Custard     Leg swelling 03/14/2018   Pneumonia    "3 times" (07/12/2017)   Pulmonary nodule    Renal cyst    Schizophrenia (HCC)    Scoliosis    Stomach problems    Thyroid disorder    Past Surgical History:  Procedure Laterality Date   CESAREAN SECTION     FOOT FRACTURE SURGERY Right    "steel plate in it"   FOOT SURGERY     " born w/dislocated  foot"   FRACTURE SURGERY     KNEE ARTHROSCOPY Right    TOE SURGERY Bilateral    "both pinky toes"   TONSILLECTOMY AND ADENOIDECTOMY       A IV Location/Drains/Wounds Patient Lines/Drains/Airways Status     Active Line/Drains/Airways     Name Placement date Placement time Site Days   Peripheral IV 11/02/22 18 G Anterior;Left;Proximal Forearm 11/02/22  2257  Forearm  1   Peripheral IV 11/03/22 20 G Right Antecubital 11/03/22  0300  Antecubital  less than 1   External Urinary Catheter 11/03/22  0924  --  less than 1            Intake/Output Last 24 hours  Intake/Output Summary (Last 24 hours) at 11/03/2022 1130 Last data filed at 11/03/2022 0526 Gross per 24 hour  Intake 98.47 ml  Output --  Net 98.47 ml    Labs/Imaging Results for orders placed or performed during the hospital encounter of 11/02/22 (from the past 48 hour(s))  CBC with Differential     Status: Abnormal   Collection Time: 11/02/22 11:29 PM  Result Value Ref Range   WBC 10.2 4.0 - 10.5 K/uL   RBC 3.03 (L) 3.87 - 5.11 MIL/uL   Hemoglobin 10.1 (L) 12.0 - 15.0 g/dL   HCT 96.0 (L) 45.4 - 09.8 %   MCV 98.0 80.0 - 100.0 fL   MCH 33.3 26.0 - 34.0 pg   MCHC 34.0 30.0 - 36.0 g/dL   RDW 11.9 (H) 14.7 - 82.9 %   Platelets 228 150 - 400 K/uL   nRBC 0.0 0.0 - 0.2 %   Neutrophils Relative % 64 %   Neutro Abs 6.5 1.7 - 7.7 K/uL   Lymphocytes Relative 18 %   Lymphs Abs 1.8 0.7 - 4.0 K/uL   Monocytes Relative 18 %   Monocytes Absolute 1.8 (H) 0.1 - 1.0 K/uL   Eosinophils Relative 0 %   Eosinophils Absolute 0.0 0.0 - 0.5 K/uL   Basophils Relative 0 %   Basophils Absolute 0.0 0.0 - 0.1 K/uL   Immature Granulocytes 0 %   Abs Immature Granulocytes 0.04 0.00 - 0.07 K/uL    Comment: Performed at [facility], [address], [place], [place] [address]  Comprehensive metabolic panel     Status: Abnormal   Collection Time: 11/02/22 11:29 PM  Result Value Ref Range   Sodium 134 (L) 135 - 145 mmol/L   Potassium  3.2 (L) 3.5 - 5.1 mmol/L   Chloride 103 98 - 111 mmol/L   CO2 21 (L) 22 - 32 mmol/L   Glucose, Bld 181 (H) 70 - 99 mg/dL    Comment: Glucose reference range applies only to samples taken after fasting for at least 8 hours.   BUN 15 8 - 23 mg/dL   Creatinine, Ser  1.41 (H) 0.44 - 1.00 mg/dL   Calcium 8.4 (L) 8.9 - 10.3 mg/dL   Total Protein 6.7 6.5 - 8.1 g/dL   Albumin 2.8 (L) 3.5 - 5.0 g/dL   AST 21 15 - 41 U/L   ALT 29 0 - 44 U/L   Alkaline Phosphatase 76 38 - 126 U/L   Total Bilirubin 0.4 0.3 - 1.2 mg/dL   GFR, Estimated 40 (L) >60 mL/min    Comment: (NOTE) Calculated using the CKD-EPI Creatinine Equation (2021)    Anion gap 10 5 - 15    Comment: Performed at [facility], [address], [place], [place] [address]  Brain natriuretic peptide     Status: Abnormal   Collection Time: 11/02/22 11:29 PM  Result Value Ref Range   B Natriuretic Peptide 583.7 (H) 0.0 - 100.0 pg/mL    Comment: Performed at [facility], [address] [place], [place] [address]  Troponin I (High Sensitivity)     Status: Abnormal   Collection Time: 11/02/22 11:29 PM  Result Value Ref Range   Troponin I (High Sensitivity) 1,113 (HH) <18 ng/L    Comment: CRITICAL RESULT CALLED TO, READ BACK BY AND VERIFIED WITH Ronnell Guadalajara RN 11/03/22 0015 AMIEHSANI F (NOTE) Elevated high sensitivity troponin I (hsTnI) values and significant  changes across serial measurements may suggest ACS but many other  chronic and acute conditions are known to elevate hsTnI results.  Refer to the Links section for chest pain algorithms and additional  guidance. Performed at [facility], [address], [place], [place] [address]   I-stat chem 8, ED (not at [facility], [facility] or [facility])     Status: Abnormal   Collection Time: 11/02/22 11:34 PM  Result Value Ref Range   Sodium 135 135 - 145 mmol/L   Potassium 3.2 (L) 3.5 - 5.1 mmol/L   Chloride 104 98 - 111 mmol/L   BUN 15 8 - 23 mg/dL   Creatinine, Ser 4.40 (H) 0.44 - 1.00  mg/dL   Glucose, Bld 347 (H) 70 - 99 mg/dL    Comment: Glucose reference range applies only to samples taken after fasting for at least 8 hours.   Calcium, Ion 1.09 (L) 1.15 - 1.40 mmol/L   TCO2 21 (L) 22 - 32 mmol/L   Hemoglobin 11.6 (L) 12.0 - 15.0 g/dL   HCT 42.5 (L) 95.6 - 38.7 %  Troponin I (High Sensitivity)     Status: Abnormal   Collection Time: 11/03/22 12:55 AM  Result Value Ref Range   Troponin I (High Sensitivity) 1,047 (HH) <18 ng/L    Comment: CRITICAL VALUE NOTED. VALUE IS CONSISTENT WITH PREVIOUSLY REPORTED/CALLED VALUE (NOTE) Elevated high sensitivity troponin I (hsTnI) values and significant  changes across serial measurements may suggest ACS but many other  chronic and acute conditions are known to elevate hsTnI results.  Refer to the "Links" section for chest pain algorithms and additional  guidance. Performed at [facility], [address], [place], [place] [address]   I-Stat venous blood gas, [facility] ED, [facility], [facility])     Status: Abnormal   Collection Time: 11/03/22  3:03 AM  Result Value Ref Range   pH, Ven 7.288 7.25 - 7.43   pCO2, Ven 48.0 44 - 60 mmHg   pO2, Ven 31 (LL) 32 - 45 mmHg   Bicarbonate 23.0 20.0 - 28.0 mmol/L   TCO2 24 22 - 32 mmol/L   O2 Saturation 51 %  Acid-base deficit 4.0 (H) 0.0 - 2.0 mmol/L   Sodium 136 135 - 145 mmol/L   Potassium 3.5 3.5 - 5.1 mmol/L   Calcium, Ion 1.20 1.15 - 1.40 mmol/L   HCT 36.0 36.0 - 46.0 %   Hemoglobin 12.2 12.0 - 15.0 g/dL   Sample type VENOUS    Comment NOTIFIED PHYSICIAN   Basic metabolic panel     Status: Abnormal   Collection Time: 11/03/22  5:46 AM  Result Value Ref Range   Sodium 134 (L) 135 - 145 mmol/L   Potassium 3.7 3.5 - 5.1 mmol/L   Chloride 103 98 - 111 mmol/L   CO2 20 (L) 22 - 32 mmol/L   Glucose, Bld 243 (H) 70 - 99 mg/dL    Comment: Glucose reference range applies only to samples taken after fasting for at least 8 hours.   BUN 15 8 - 23 mg/dL   Creatinine, Ser 1.61 (H) 0.44 - 1.00  mg/dL   Calcium 8.3 (L) 8.9 - 10.3 mg/dL   GFR, Estimated 41 (L) >60 mL/min    Comment: (NOTE) Calculated using the CKD-EPI Creatinine Equation (2021)    Anion gap 11 5 - 15    Comment: Performed at [facility], [address], [place], [place] [address]  CBC     Status: Abnormal   Collection Time: 11/03/22  5:46 AM  Result Value Ref Range   WBC 10.0 4.0 - 10.5 K/uL   RBC 3.18 (L) 3.87 - 5.11 MIL/uL   Hemoglobin 10.5 (L) 12.0 - 15.0 g/dL   HCT 54.0 (L) 98.1 - 19.1 %   MCV 97.2 80.0 - 100.0 fL   MCH 33.0 26.0 - 34.0 pg   MCHC 34.0 30.0 - 36.0 g/dL   RDW 47.8 (H) 29.5 - 62.1 %   Platelets 220 150 - 400 K/uL   nRBC 0.0 0.0 - 0.2 %    Comment: Performed at [facility], [address], [place], [place] [address]  Protime-INR     Status: None   Collection Time: 11/03/22  5:46 AM  Result Value Ref Range   Prothrombin Time 13.4 11.4 - 15.2 seconds   INR 1.0 0.8 - 1.2    Comment: (NOTE) INR goal varies based on device and disease states. Performed at [facility], [address], [place], [place] [address]   Lipid panel     Status: None   Collection Time: 11/03/22  5:46 AM  Result Value Ref Range   Cholesterol 121 0 - 200 mg/dL   Triglycerides 43 <629 mg/dL   HDL 43 >52 mg/dL   Total CHOL/HDL Ratio 2.8 RATIO   VLDL 9 0 - 40 mg/dL   LDL Cholesterol 69 0 - 99 mg/dL    Comment:        Total Cholesterol/HDL:CHD Risk Coronary Heart Disease Risk Table                     Men   Women  1/2 Average Risk   3.4   3.3  Average Risk       5.0   4.4  2 X Average Risk   9.6   7.1  3 X Average Risk  23.4   11.0        Use the calculated Patient Ratio above and the CHD Risk Table to determine the patient's CHD Risk.        ATP III CLASSIFICATION (LDL):  <100  mg/dL   Optimal  161-096  mg/dL   Near or Above                    Optimal  130-159  mg/dL   Borderline  045-409  mg/dL   High  >811     mg/dL   Very High Performed at [facility], [address] [place], [place] [address]    *Note: Due to a large number of results and/or encounters for the requested time period, some results have not been displayed. A complete set of results can be found in Results Review.   DG Chest Port 1 View  Result Date: 11/02/2022 CLINICAL DATA:  Cough.  Shortness of breath. EXAM: PORTABLE CHEST 1 VIEW COMPARISON:  Chest radiograph dated 03/07/2022. FINDINGS: Bibasilar atelectasis. No focal consolidation, pleural effusion, or pneumothorax. Stable cardiomegaly. Atherosclerotic calcification of the aortic arch. No acute osseous pathology. Degenerative changes of the spine and scoliosis. IMPRESSION: Bibasilar atelectasis. No focal consolidation. Electronically Signed   By: Elgie Collard M.D.   On: 11/02/2022 23:18    Pending Labs Unresulted Labs (From admission, onward)     Start     Ordered   11/04/22 0500  Heparin level (unfractionated)  Daily,   R      11/03/22 0136   11/04/22 0500  CBC  Daily,   R      11/03/22 0136   11/03/22 1000  Heparin level (unfractionated)  Once-Timed,   TIMED        11/03/22 0136            Vitals/Pain Today's Vitals   11/03/22 0800 11/03/22 0830 11/03/22 0900 11/03/22 0933  BP: 110/66 125/64 (!) 114/55   Pulse: 92 83 88   Resp: (!) 22 (!) 21 (!) 22   Temp:      TempSrc:      SpO2: 100% 100% 100%   Weight:      Height:      PainSc: Asleep   Asleep    Isolation Precautions No active isolations  Medications Medications  nitroGLYCERIN (NITROSTAT) SL tablet 0.4 mg (has no administration in time range)  acetaminophen (TYLENOL) tablet 650 mg (has no administration in time range)  ondansetron (ZOFRAN) injection 4 mg (has no administration in time range)  albuterol (PROVENTIL) (2.5 MG/3ML) 0.083% nebulizer solution 3 mL (has no administration in time range)  amLODipine (NORVASC) tablet 2.5 mg (2.5 mg Oral Not Given 11/03/22 0930)  dapagliflozin propanediol (FARXIGA) tablet 5 mg (5 mg Oral Not Given 11/03/22 0750)   donepezil (ARICEPT) tablet 5 mg (5 mg Oral Not Given 11/03/22 0312)  levothyroxine (SYNTHROID) tablet 50 mcg (50 mcg Oral Not Given 11/03/22 0711)  OLANZapine (ZYPREXA) tablet 20 mg (20 mg Oral Not Given 11/03/22 0312)  pantoprazole (PROTONIX) EC tablet 40 mg (40 mg Oral Not Given 11/03/22 0930)  potassium chloride (KLOR-CON M) CR tablet 10 mEq (10 mEq Oral Not Given 11/03/22 0931)  rosuvastatin (CRESTOR) tablet 40 mg (40 mg Oral Not Given 11/03/22 0932)  sucralfate (CARAFATE) 1 GM/10ML suspension 1 g (1 g Oral Not Given 11/03/22 0750)  linagliptin (TRADJENTA) tablet 5 mg (5 mg Oral Not Given 11/03/22 0932)  valproic acid (DEPAKENE) 250 MG capsule 250 mg (250 mg Oral Not Given 11/03/22 0933)  heparin ADULT infusion 100 units/mL (25000 units/222mL) (800 Units/hr Intravenous New Bag/Given 11/03/22 0154)  LORazepam (ATIVAN) injection 1 mg (1 mg Intravenous Given 11/03/22 0034)  ibuprofen (ADVIL) tablet 400 mg (400  mg Oral Given 11/03/22 0042)  LORazepam (ATIVAN) injection 1 mg (1 mg Intravenous Given 11/03/22 0042)  heparin bolus via infusion 3,000 Units (3,000 Units Intravenous Bolus from Bag 11/03/22 0154)  furosemide (LASIX) injection 80 mg (80 mg Intravenous Given 11/03/22 0539)  potassium chloride 10 mEq in 100 mL IVPB (0 mEq Intravenous Stopped 11/03/22 0526)  ipratropium-albuterol (DUONEB) 0.5-2.5 (3) MG/3ML nebulizer solution 3 mL (3 mLs Nebulization Given 11/03/22 0312)  haloperidol lactate (HALDOL) injection 2 mg (2 mg Intramuscular Given 11/03/22 0512)    Mobility walks     Focused Assessments Cardiac Assessment Handoff:    Lab Results  Component Value Date   CKTOTAL 251 (H) 03/07/2022   CKMB 1.6 04/02/2022   TROPONINI <0.03 12/26/2016   No results found for: "DDIMER" Does the Patient currently have chest pain? No    R Recommendations: See Admitting Provider Note  Report given to:   Additional Notes:

## 2022-11-03 NOTE — Progress Notes (Signed)
RT arrived at bedside for routineBiPAP assessment. When RT arrived pt was aggravated and ripped off BiPAP mask. When RT attempted to place pt on [place] pt began to yell and refused to be placed on oxygen. Pt became combative, aggravated and pt stated she wanted to leave. RT informed nurse of pt status, vitals stable, SpO2 100%, RT will monitor as needed.    11/03/22 1230  Therapy Vitals  Pulse Rate 89  Resp 20  BP (!) 89/62  MEWS Score/Color  MEWS Score 1  MEWS Score Color Green  Respiratory Assessment  Assessment Type Assess only  Respiratory Pattern Regular;Unlabored  Chest Assessment Chest expansion symmetrical  Cough None  Bilateral Breath Sounds Diminished  Oxygen Therapy/Pulse Ox  O2 Device (S)  Room Air (Done by pt. pt refusing O2)  O2 Therapy Room air  SpO2 100 %

## 2022-11-03 NOTE — Progress Notes (Signed)
Pt has PRN bipap oders, no distress noted at this time.

## 2022-11-03 NOTE — Care Management (Signed)
  Transition of Care [facility]) Screening Note   Patient Details  Name: [name] Long Date of Birth: 24-Jan-1951   Transition of Care [facility]) CM/SW Contact:    Gala Lewandowsky, RN Phone Number: 11/03/2022, 4:40 PM    Transition of Care Department [facility]) has reviewed the patient. Patient presented for shortness of breath and chest pain. PTA patient was from home with daughter [name] Long @ [phone]. Case Manager received notification via secure chat from the CSW that the daughter [name] Long had questions in regards to disposition. Case Manager attempted to see if [name] Long was in the room and son Trey Paula is at the bedside and he has concerns regarding her diagnosis of schizoaffective disorder. Son states the patient was recently discharged from the ED on 10-04-22 to [facility] and she stayed there for 2 weeks. Son feels that the patient did not receive adequate treatment and the ACT team will not follow the patient in the community. Case Manager relayed the information to the CSW and she will reach out to daughter [name] Long. Patient will benefit from a psych consult. We will continue to monitor patient advancement through interdisciplinary progression rounds. If new patient transition needs arise, please place a TOC consult.

## 2022-11-03 NOTE — ED Notes (Signed)
ED TO INPATIENT HANDOFF REPORT  ED Nurse Name and Phone #: Venita Sheffield RN [phone]  S Name/Age/Gender [name] Long 72 y.o. female Room/Bed: 018C/018C  Code Status   Code Status: Full Code  Home/SNF/Other Home Patient oriented to: self and place Is this baseline? Yes   Triage Complete: Triage complete  Chief Complaint Acute coronary syndrome [facility]) [I24.9]  Triage Note Patient BIB [facility] from home w/ [facility] that started today. Patient w/ hx of lung cancer/ stage 4 kidney disease,not on dialysis. Just recently started smoking again.Patient inspiratory and expiratory wheezes. Started on duoneb x2, 125 solumedrol given, and 2 grams of mag. Patient currently on neb treatment on arrival.   150/91 108 -120 HR  100% SpO2 RA   Allergies Allergies  Allergen Reactions   Citrus Anaphylaxis and Itching   Fish Allergy Anaphylaxis and Other (See Comments)   Shellfish Allergy Shortness Of Breath and Other (See Comments)    "Affects thyroid" also   Adhesive [Tape] Other (See Comments)    Must have paper tape only   Ibuprofen Swelling and Other (See Comments)    Face swells   Latex Dermatitis   Lipitor [Atorvastatin Calcium] Other (See Comments)    Body aches   Lisinopril Other (See Comments)    PER DR. Sherene Sires (not recalled by patient)   Other Nausea Only and Other (See Comments)    Collards (gas, too)   Codeine Rash   Tramadol Palpitations    Level of Care/Admitting Diagnosis ED Disposition     ED Disposition  Admit   Condition  --   Comment  Hospital Area: [facility] [[id number]]  Level of Care: Progressive [102]  Admit to Progressive based on following criteria: CARDIOVASCULAR & THORACIC of moderate stability with acute coronary syndrome symptoms/low risk myocardial infarction/hypertensive urgency/arrhythmias/heart failure potentially compromising stability and stable post cardiovascular intervention patients.  May admit patient to [facility] or [facility] if equivalent level of care is available:: No  Covid Evaluation: Asymptomatic - no recent exposure (last 10 days) testing not required  Diagnosis: Acute coronary syndrome [facility]) [[id number]]  Admitting Physician: Orpah Cobb [[id number]]  Attending Physician: Orpah Cobb [[id number]]  Certification:: I certify this patient will need inpatient services for at least 2 midnights  Estimated Length of Stay: 3          B Medical/Surgery History Past Medical History:  Diagnosis Date   Abscessed tooth 02/09/2020   Acute on chronic congestive heart failure (HCC)    Adenomatous colon polyp    Arthritis    "real bad; all over" (07/12/2017)   Asthma    Atrial fibrillation (HCC)    BIPOLAR DISORDER 08/31/2006   Qualifier: Diagnosis of  By: Irving Burton MD, Clifton Custard     CHRONIC KIDNEY DISEASE STAGE II (MILD) 09/14/2009   Annotation: eGFR 64 Qualifier: Diagnosis of  By: Louanne Belton MD, Erik     Chronic lower back pain    Congestive heart failure (CHF) (HCC)    COPD (chronic obstructive pulmonary disease) (HCC) 09/23/2010   Diagnosed at [facility] in 2008 (Dr. Maple Hudson)    DM (diabetes mellitus) type II controlled with renal manifestation (HCC) 08/31/2006   Qualifier: Diagnosis of  By: Irving Burton MD, Clifton Custard     Dyspnea    "all my life; since 6th grade" (07/12/2017)   Fatty liver    HYPERCHOLESTEROLEMIA 08/31/2006   Intolerance to Lipitor OK on Crestor but medicaid no longer covering     HYPERTENSION, BENIGN SYSTEMIC 08/31/2006  Qualifier: Diagnosis of  By: Irving Burton MD, Aaron     Hypokalemia    HYPOTHYROIDISM, UNSPECIFIED 08/31/2006   Qualifier: Diagnosis of  By: Irving Burton MD, Clifton Custard     Leg swelling 03/14/2018   Pneumonia    "3 times" (07/12/2017)   Pulmonary nodule    Renal cyst    Schizophrenia (HCC)    Scoliosis    Stomach problems    Thyroid disorder    Past Surgical History:  Procedure Laterality Date   CESAREAN SECTION     FOOT FRACTURE SURGERY Right    "steel plate in it"   FOOT SURGERY     " born  w/dislocated foot"   FRACTURE SURGERY     KNEE ARTHROSCOPY Right    TOE SURGERY Bilateral    "both pinky toes"   TONSILLECTOMY AND ADENOIDECTOMY       A IV Location/Drains/Wounds Patient Lines/Drains/Airways Status     Active Line/Drains/Airways     Name Placement date Placement time Site Days   Peripheral IV 11/02/22 18 G Anterior;Left;Proximal Forearm 11/02/22  2257  Forearm  1            Intake/Output Last 24 hours No intake or output data in the 24 hours ending 11/03/22 0212  Labs/Imaging Results for orders placed or performed during the hospital encounter of 11/02/22 (from the past 48 hour(s))  CBC with Differential     Status: Abnormal   Collection Time: 11/02/22 11:29 PM  Result Value Ref Range   WBC 10.2 4.0 - 10.5 K/uL   RBC 3.03 (L) 3.87 - 5.11 MIL/uL   Hemoglobin 10.1 (L) 12.0 - 15.0 g/dL   HCT 40.9 (L) 81.1 - 91.4 %   MCV 98.0 80.0 - 100.0 fL   MCH 33.3 26.0 - 34.0 pg   MCHC 34.0 30.0 - 36.0 g/dL   RDW 78.2 (H) 95.6 - 21.3 %   Platelets 228 150 - 400 K/uL   nRBC 0.0 0.0 - 0.2 %   Neutrophils Relative % 64 %   Neutro Abs 6.5 1.7 - 7.7 K/uL   Lymphocytes Relative 18 %   Lymphs Abs 1.8 0.7 - 4.0 K/uL   Monocytes Relative 18 %   Monocytes Absolute 1.8 (H) 0.1 - 1.0 K/uL   Eosinophils Relative 0 %   Eosinophils Absolute 0.0 0.0 - 0.5 K/uL   Basophils Relative 0 %   Basophils Absolute 0.0 0.0 - 0.1 K/uL   Immature Granulocytes 0 %   Abs Immature Granulocytes 0.04 0.00 - 0.07 K/uL    Comment: Performed at [facility], [address] [place], [place] [address]  Comprehensive metabolic panel     Status: Abnormal   Collection Time: 11/02/22 11:29 PM  Result Value Ref Range   Sodium 134 (L) 135 - 145 mmol/L   Potassium 3.2 (L) 3.5 - 5.1 mmol/L   Chloride 103 98 - 111 mmol/L   CO2 21 (L) 22 - 32 mmol/L   Glucose, Bld 181 (H) 70 - 99 mg/dL    Comment: Glucose reference range applies only to samples taken after fasting for at least 8 hours.   BUN  15 8 - 23 mg/dL   Creatinine, Ser 8.46 (H) 0.44 - 1.00 mg/dL   Calcium 8.4 (L) 8.9 - 10.3 mg/dL   Total Protein 6.7 6.5 - 8.1 g/dL   Albumin 2.8 (L) 3.5 - 5.0 g/dL   AST 21 15 - 41 U/L   ALT 29 0 - 44 U/L  Alkaline Phosphatase 76 38 - 126 U/L   Total Bilirubin 0.4 0.3 - 1.2 mg/dL   GFR, Estimated 40 (L) >60 mL/min    Comment: (NOTE) Calculated using the CKD-EPI Creatinine Equation (2021)    Anion gap 10 5 - 15    Comment: Performed at [facility], [address], [place], [place] [address]  Brain natriuretic peptide     Status: Abnormal   Collection Time: 11/02/22 11:29 PM  Result Value Ref Range   B Natriuretic Peptide 583.7 (H) 0.0 - 100.0 pg/mL    Comment: Performed at [facility], [address], [place], [place] [address]  Troponin I (High Sensitivity)     Status: Abnormal   Collection Time: 11/02/22 11:29 PM  Result Value Ref Range   Troponin I (High Sensitivity) 1,113 (HH) <18 ng/L    Comment: CRITICAL RESULT CALLED TO, READ BACK BY AND VERIFIED WITH Ronnell Guadalajara RN 11/03/22 0015 AMIEHSANI F (NOTE) Elevated high sensitivity troponin I (hsTnI) values and significant  changes across serial measurements may suggest ACS but many other  chronic and acute conditions are known to elevate hsTnI results.  Refer to the Links section for chest pain algorithms and additional  guidance. Performed at [facility], [address], [place], [place] [address]   I-stat chem 8, ED (not at [facility], [facility] or [facility])     Status: Abnormal   Collection Time: 11/02/22 11:34 PM  Result Value Ref Range   Sodium 135 135 - 145 mmol/L   Potassium 3.2 (L) 3.5 - 5.1 mmol/L   Chloride 104 98 - 111 mmol/L   BUN 15 8 - 23 mg/dL   Creatinine, Ser 2.13 (H) 0.44 - 1.00 mg/dL   Glucose, Bld 086 (H) 70 - 99 mg/dL    Comment: Glucose reference range applies only to samples taken after fasting for at least 8 hours.   Calcium, Ion 1.09 (L) 1.15 - 1.40 mmol/L   TCO2 21 (L) 22 - 32 mmol/L    Hemoglobin 11.6 (L) 12.0 - 15.0 g/dL   HCT 57.8 (L) 46.9 - 62.9 %  Troponin I (High Sensitivity)     Status: Abnormal   Collection Time: 11/03/22 12:55 AM  Result Value Ref Range   Troponin I (High Sensitivity) 1,047 (HH) <18 ng/L    Comment: CRITICAL VALUE NOTED. VALUE IS CONSISTENT WITH PREVIOUSLY REPORTED/CALLED VALUE (NOTE) Elevated high sensitivity troponin I (hsTnI) values and significant  changes across serial measurements may suggest ACS but many other  chronic and acute conditions are known to elevate hsTnI results.  Refer to the "Links" section for chest pain algorithms and additional  guidance. Performed at [facility], [address] [place], [place] [address]    *Note: Due to a large number of results and/or encounters for the requested time period, some results have not been displayed. A complete set of results can be found in Results Review.   DG Chest Port 1 View  Result Date: 11/02/2022 CLINICAL DATA:  Cough.  Shortness of breath. EXAM: PORTABLE CHEST 1 VIEW COMPARISON:  Chest radiograph dated 03/07/2022. FINDINGS: Bibasilar atelectasis. No focal consolidation, pleural effusion, or pneumothorax. Stable cardiomegaly. Atherosclerotic calcification of the aortic arch. No acute osseous pathology. Degenerative changes of the spine and scoliosis. IMPRESSION: Bibasilar atelectasis. No focal consolidation. Electronically Signed   By: Elgie Collard M.D.   On: 11/02/2022 23:18    Pending Labs [employer] (From admission, onward)     Start  Ordered   11/04/22 0500  Heparin level (unfractionated)  Daily,   R      11/03/22 0136   11/04/22 0500  CBC  Daily,   R      11/03/22 0136   11/03/22 1000  Heparin level (unfractionated)  Once-Timed,   TIMED        11/03/22 0136   11/03/22 0500  Lipid panel  Tomorrow morning,   R        11/03/22 0123   11/03/22 0500  Basic metabolic panel  Tomorrow morning,   R        11/03/22 0123   11/03/22 0500  CBC  Tomorrow morning,    R        11/03/22 0123   11/03/22 0500  Protime-INR  Tomorrow morning,   R        11/03/22 0123            Vitals/Pain Today's Vitals   11/03/22 0115 11/03/22 0130 11/03/22 0145 11/03/22 0158  BP: 101/62 98/62 97/61    Pulse:      Resp: (!) 26 (!) 24 (!) 27   Temp:      TempSrc:      SpO2:      Weight:      Height:      PainSc:    Asleep    Isolation Precautions No active isolations  Medications Medications  nitroGLYCERIN (NITROSTAT) SL tablet 0.4 mg (has no administration in time range)  acetaminophen (TYLENOL) tablet 650 mg (has no administration in time range)  ondansetron (ZOFRAN) injection 4 mg (has no administration in time range)  albuterol (PROVENTIL) (2.5 MG/3ML) 0.083% nebulizer solution 3 mL (has no administration in time range)  amLODipine (NORVASC) tablet 2.5 mg (has no administration in time range)  dapagliflozin propanediol (FARXIGA) tablet 5 mg (has no administration in time range)  donepezil (ARICEPT) tablet 5 mg (has no administration in time range)  levothyroxine (SYNTHROID) tablet 50 mcg (has no administration in time range)  OLANZapine (ZYPREXA) tablet 20 mg (has no administration in time range)  pantoprazole (PROTONIX) EC tablet 40 mg (has no administration in time range)  potassium chloride (KLOR-CON M) CR tablet 10 mEq (has no administration in time range)  rosuvastatin (CRESTOR) tablet 40 mg (has no administration in time range)  sucralfate (CARAFATE) 1 GM/10ML suspension 1 g (has no administration in time range)  linagliptin (TRADJENTA) tablet 5 mg (has no administration in time range)  valproic acid (DEPAKENE) 250 MG capsule 250 mg (has no administration in time range)  heparin ADULT infusion 100 units/mL (25000 units/259mL) (800 Units/hr Intravenous New Bag/Given 11/03/22 0154)  furosemide (LASIX) injection 80 mg (has no administration in time range)  potassium chloride 10 mEq in 100 mL IVPB (has no administration in time range)  LORazepam  (ATIVAN) injection 1 mg (1 mg Intravenous Given 11/03/22 0034)  ibuprofen (ADVIL) tablet 400 mg (400 mg Oral Given 11/03/22 0042)  LORazepam (ATIVAN) injection 1 mg (1 mg Intravenous Given 11/03/22 0042)  heparin bolus via infusion 3,000 Units (3,000 Units Intravenous Bolus from Bag 11/03/22 0154)    Mobility walks     Focused Assessments Cardiac Assessment Handoff:    Lab Results  Component Value Date   CKTOTAL 251 (H) 03/07/2022   CKMB 1.6 04/02/2022   TROPONINI <0.03 12/26/2016   No results found for: "DDIMER" Does the Patient currently have chest pain? No    R Recommendations: See Admitting Provider Note  Report given to:   Additional  Notes: Patient was given Ativan for agitation, Patient has PMHX of Bipolar, Schizo & Dementia. Patient's [name] Long (daughter) is her legal guardian. Patient is now calm and resting.

## 2022-11-03 NOTE — Progress Notes (Signed)
Ref: Program, [facility]   Subjective:  Awake. Off Bipap. Off oxygen by [place]. Non-compliant with lab draw. Asking for food otherwise leave hospital.. Accepts my recommendation to stay in hospital for 1-2 more days to make heart stronger with medications. She is mentally not stable for cardiac interventions. Echocardiogram shows low normal LV systolic function with moderate MR and mild TR.  Objective:  Vital Signs in the last 24 hours: Temp:  [97.7 F (36.5 C)-98.8 F (37.1 C)] 98 F (36.7 C) (05/02 1212) Pulse Rate:  [73-113] 89 (05/02 1230) Resp:  [20-36] 20 (05/02 1230) BP: (89-127)/(48-89) 89/62 (05/02 1230) SpO2:  [95 %-100 %] 100 % (05/02 1230) FiO2 (%):  [40 %] 40 % (05/02 0907) Weight:  [66 kg-66.8 kg] 66 kg (05/01 2253)  Physical Exam: BP Readings from Last 1 Encounters:  11/03/22 (!) 89/62     Wt Readings from Last 1 Encounters:  11/02/22 66 kg    Weight change:  Body mass index is 24.21 kg/m. HEENT: [place]/AT, Eyes-Brown, Conjunctiva-Pink, Sclera-Non-icteric Neck: No JVD, No bruit, Trachea midline. Lungs:  Clearing, Bilateral. Cardiac:  Irregular rhythm, normal S1 and S2, no S3. II/VI systolic murmur. Abdomen:  Soft, non-tender. BS present. Extremities:  Trace edema present. No cyanosis. No clubbing. CNS: AxOx2, Cranial nerves grossly intact, moves all 4 extremities.  Skin: Warm and dry.   Intake/Output from previous day: 05/01 0701 - 05/02 0700 In: 98.5 [IV Piggyback:98.5] Out: -     Lab Results: BMET    Component Value Date/Time   NA 134 (L) 11/03/2022 0546   NA 136 11/03/2022 0303   NA 135 11/02/2022 2334   NA 137 03/23/2020 1433   NA 141 03/12/2020 1529   NA 139 01/11/2019 1419   K 3.7 11/03/2022 0546   K 3.5 11/03/2022 0303   K 3.2 (L) 11/02/2022 2334   CL 103 11/03/2022 0546   CL 104 11/02/2022 2334   CL 103 11/02/2022 2329   CO2 20 (L) 11/03/2022 0546   CO2 21 (L) 11/02/2022 2329   CO2 23 11/02/2022 1524    GLUCOSE 243 (H) 11/03/2022 0546   GLUCOSE 174 (H) 11/02/2022 2334   GLUCOSE 181 (H) 11/02/2022 2329   BUN 15 11/03/2022 0546   BUN 15 11/02/2022 2334   BUN 15 11/02/2022 2329   BUN 25 03/23/2020 1433   BUN 16 03/12/2020 1529   BUN 17 01/11/2019 1419   CREATININE 1.37 (H) 11/03/2022 0546   CREATININE 1.50 (H) 11/02/2022 2334   CREATININE 1.41 (H) 11/02/2022 2329   CREATININE 1.20 (H) 11/02/2022 1524   CREATININE 1.52 (H) 08/02/2022 1449   CREATININE 1.35 (H) 06/03/2022 1528   CALCIUM 8.3 (L) 11/03/2022 0546   CALCIUM 8.4 (L) 11/02/2022 2329   CALCIUM 8.9 11/02/2022 1524   GFRNONAA 41 (L) 11/03/2022 0546   GFRNONAA 40 (L) 11/02/2022 2329   GFRNONAA 38 (L) 10/04/2022 1733   GFRNONAA 47 (L) 10/01/2020 1147   GFRNONAA 46 (L) 05/26/2020 1553   GFRNONAA 48 (L) 04/30/2020 1206   GFRAA 54 (L) 10/01/2020 1147   GFRAA 54 (L) 05/26/2020 1553   GFRAA 56 (L) 04/30/2020 1206   CBC    Component Value Date/Time   WBC 10.0 11/03/2022 0546   RBC 3.18 (L) 11/03/2022 0546   HGB 10.5 (L) 11/03/2022 0546   HCT 30.9 (L) 11/03/2022 0546   PLT 220 11/03/2022 0546   MCV 97.2 11/03/2022 0546   MCH 33.0 11/03/2022 0546   MCHC 34.0 11/03/2022  0546   RDW 17.1 (H) 11/03/2022 0546   LYMPHSABS 1.8 11/02/2022 2329   MONOABS 1.8 (H) 11/02/2022 2329   EOSABS 0.0 11/02/2022 2329   BASOSABS 0.0 11/02/2022 2329   HEPATIC Function Panel Recent Labs    03/08/22 0915 04/07/22 1200 08/02/22 1449 10/04/22 1733 11/02/22 1524 11/02/22 2329  PROT 6.7 6.6   < > 7.4 6.8 6.7  ALBUMIN 3.1*  --   --  3.3*  --  2.8*  AST 21 9*   < > 20 18 21   ALT 14 9   < > 13 26 29   ALKPHOS 76  --   --  70  --  76  BILIDIR  --  0.0  --   --   --   --   IBILI  --  0.1*  --   --   --   --    < > = values in this interval not displayed.   HEMOGLOBIN A1C Lab Results  Component Value Date   MPG 192 08/02/2022   CARDIAC ENZYMES Lab Results  Component Value Date   CKTOTAL 251 (H) 03/07/2022   CKMB 1.6 04/02/2022    TROPONINI <0.03 12/26/2016   TROPONINI <0.03 12/25/2016   BNP No results for input(s): "PROBNP" in the last 8760 hours. TSH Recent Labs    04/07/22 1200 10/04/22 1800 11/02/22 1524  TSH 1.56 0.987 1.02   CHOLESTEROL Recent Labs    03/08/22 0014 04/07/22 1200 11/03/22 0546  CHOL 215* 147 121    Scheduled Meds:  amLODipine  2.5 mg Oral Daily   dapagliflozin propanediol  5 mg Oral QAC breakfast   donepezil  5 mg Oral QHS   levothyroxine  50 mcg Oral QAC breakfast   linagliptin  5 mg Oral Daily   LORazepam  1 mg Intravenous Once   OLANZapine  20 mg Oral QHS   pantoprazole  40 mg Oral Daily   potassium chloride  10 mEq Oral TID   rosuvastatin  40 mg Oral Daily   sucralfate  1 g Oral BID WC   valproic acid  250 mg Oral BID   Continuous Infusions: PRN Meds:.acetaminophen, albuterol, nitroGLYCERIN, ondansetron (ZOFRAN) IV  Assessment/Plan: NSTEMI Acute systolic left heart failure Moderate MR Mild TR Type 2 DM HTN HLD CKD, IV Hypothyroidism Schizophrenia with manic episode COPD  Plan: Discussed with family about patients desire to leave after 1-2 days stay. Patient will be treated medically for now, Mentally she is not stable for cardiac interventions. Diet order entered. Change IV heparin to Lovenox injection once a day. Add Zyprexa and IV to PO Lorazepam.   LOS: 0 days   Time spent including chart review, lab review, examination, discussion with patient/Family/Nurse : 30 min   Orpah Cobb  MD  11/03/2022, 1:00 PM

## 2022-11-03 NOTE — ED Notes (Signed)
MD aware of patient refusing Heparin lab draw.

## 2022-11-03 NOTE — Progress Notes (Signed)
Patient called to nurses station complaining of chest pain radiating to left shoulder, nurse in to check on patient. Patient states she wasn't having chest pain but was having wrist and ankle pain instead. Nurse went ahead and obtained EKG and placed in chart. Recent set of vitals obtained. Explained to patient and family why EKG and vitals were obtained.

## 2022-11-03 NOTE — Progress Notes (Signed)
ANTICOAGULATION CONSULT NOTE - Initial Consult  Pharmacy Consult for Heparin Indication: chest pain/ACS  Allergies  Allergen Reactions   Citrus Anaphylaxis and Itching   Fish Allergy Anaphylaxis and Other (See Comments)   Shellfish Allergy Shortness Of Breath and Other (See Comments)    "Affects thyroid" also   Adhesive [Tape] Other (See Comments)    Must have paper tape only   Ibuprofen Swelling and Other (See Comments)    Face swells   Latex Dermatitis   Lipitor [Atorvastatin Calcium] Other (See Comments)    Body aches   Lisinopril Other (See Comments)    PER DR. Sherene Sires (not recalled by patient)   Other Nausea Only and Other (See Comments)    Collards (gas, too)   Codeine Rash   Tramadol Palpitations    Patient Measurements: Height: 5\' 5"  (165.1 cm) Weight: 66 kg (145 lb 8.1 oz) IBW/kg (Calculated) : 57  Vital Signs: Temp: 98.8 F (37.1 C) (05/01 2256) Temp Source: Oral (05/01 2256) BP: 108/71 (05/02 0100) Pulse Rate: 90 (05/02 0100)  Labs: Recent Labs    11/02/22 1524 11/02/22 2329 11/02/22 2334  HGB 11.0* 10.1* 11.6*  HCT 33.5* 29.7* 34.0*  PLT 265 228  --   CREATININE 1.20* 1.41* 1.50*  TROPONINIHS  --  1,113*  --     Estimated Creatinine Clearance: 31 mL/min (A) (by C-G formula based on SCr of 1.5 mg/dL (H)).   Medical History: Past Medical History:  Diagnosis Date   Abscessed tooth 02/09/2020   Acute on chronic congestive heart failure (HCC)    Adenomatous colon polyp    Arthritis    "real bad; all over" (07/12/2017)   Asthma    Atrial fibrillation (HCC)    BIPOLAR DISORDER 08/31/2006   Qualifier: Diagnosis of  By: Irving Burton MD, Clifton Custard     CHRONIC KIDNEY DISEASE STAGE II (MILD) 09/14/2009   Annotation: eGFR 64 Qualifier: Diagnosis of  By: Louanne Belton MD, Rolm Gala     Chronic lower back pain    Congestive heart failure (CHF) (HCC)    COPD (chronic obstructive pulmonary disease) (HCC) 09/23/2010   Diagnosed at [facility] in 2008 (Dr. Maple Hudson)    DM (diabetes  mellitus) type II controlled with renal manifestation (HCC) 08/31/2006   Qualifier: Diagnosis of  By: Irving Burton MD, Clifton Custard     Dyspnea    "all my life; since 6th grade" (07/12/2017)   Fatty liver    HYPERCHOLESTEROLEMIA 08/31/2006   Intolerance to Lipitor OK on Crestor but medicaid no longer covering     HYPERTENSION, BENIGN SYSTEMIC 08/31/2006   Qualifier: Diagnosis of  By: Irving Burton MD, Aaron     Hypokalemia    HYPOTHYROIDISM, UNSPECIFIED 08/31/2006   Qualifier: Diagnosis of  By: Irving Burton MD, Clifton Custard     Leg swelling 03/14/2018   Pneumonia    "3 times" (07/12/2017)   Pulmonary nodule    Renal cyst    Schizophrenia (HCC)    Scoliosis    Stomach problems    Thyroid disorder     Medications:  No current facility-administered medications on file prior to encounter.   Current Outpatient Medications on File Prior to Encounter  Medication Sig Dispense Refill   albuterol (VENTOLIN HFA) 108 (90 Base) MCG/ACT inhaler Inhale 2 puffs into the lungs 4 (four) times daily as needed for wheezing or shortness of breath. DX J44.9 18 g 1   amLODipine (NORVASC) 2.5 MG tablet Take 2.5 mg by mouth daily.     Blood Glucose Monitoring Suppl (FREESTYLE  LITE) w/Device KIT 1 each by Does not apply route daily. Dx:E11.9 1 kit 0   Blood Pressure Monitoring (BLOOD PRESSURE MONITOR/WRIST) KIT 1 Units by Does not apply route daily at 6 (six) AM. 1 kit 0   dapagliflozin propanediol (FARXIGA) 10 MG TABS tablet Take 1 tablet (10 mg total) by mouth daily before breakfast. 30 tablet 3   donepezil (ARICEPT) 5 MG tablet Take 5 mg by mouth at bedtime.     glucose blood (FREESTYLE LITE) test strip Use as instructed 100 each 5   Lancets (FREESTYLE) lancets Use to check blood sugar once daily. Dx: E11.9 100 each 5   levothyroxine (SYNTHROID) 50 MCG tablet Take 50 mcg by mouth daily.     methocarbamol (ROBAXIN) 500 MG tablet TAKE 1 TABLET(500 MG) BY MOUTH THREE TIMES DAILY AS NEEDED FOR MUSCLE SPASMS 30 tablet 0   metoprolol  succinate (TOPROL-XL) 50 MG 24 hr tablet Take 1 tablet (50 mg total) by mouth daily. 30 tablet 11   nitroGLYCERIN (NITROSTAT) 0.4 MG SL tablet Place 1 tablet (0.4 mg total) under the tongue as needed. 30 tablet 12   OLANZapine (ZYPREXA) 20 MG tablet Take 1 tablet (20 mg total) by mouth at bedtime. 90 tablet 3   omeprazole (PRILOSEC) 40 MG capsule Take 40 mg by mouth 2 (two) times daily.     potassium chloride (KLOR-CON) 10 MEQ tablet Take 10 mEq by mouth 2 (two) times daily.     potassium chloride (MICRO-K) 10 MEQ CR capsule Take 10 mEq by mouth every other day.     PRESCRIPTION MEDICATION Take 1 Dose by nebulization. Unknown nebulizer solution     rosuvastatin (CRESTOR) 40 MG tablet Take 1 tablet (40 mg total) by mouth daily. 30 tablet 2   sucralfate (CARAFATE) 1 GM/10ML suspension Take 1 g by mouth. Daughter stated patient is taking this med but unsure of how she is taking it     TRADJENTA 5 MG TABS tablet Take 5 mg by mouth daily.     valproic acid (DEPAKENE) 250 MG capsule Take 1 capsule (250 mg total) by mouth 2 (two) times daily. 60 capsule 3     Assessment: 72 y.o. female with shortness of breath and elevated troponin, possible ACS, for heparin Goal of Therapy:  Heparin level 0.3-0.7 units/ml Monitor platelets by anticoagulation protocol: Yes   Plan:  Heparin 3000 units IV bolus, then start heparin 800 units/hr Check heparin level in 8 hours.   [name] Long 11/03/2022,1:26 AM

## 2022-11-03 NOTE — ED Notes (Signed)
Paged [name] to RN Glee Arvin

## 2022-11-03 NOTE — Progress Notes (Signed)
Echocardiogram 2D Echocardiogram has been performed.  [name] Long 11/03/2022, 11:02 AM

## 2022-11-03 NOTE — ED Notes (Signed)
Patient refusing Heparin blood draw, and demanding to go home. Patient states "yall not doing anything else to me". MD made aware

## 2022-11-03 NOTE — ED Notes (Signed)
RT notified of patient's removal of mask.

## 2022-11-03 NOTE — TOC Initial Note (Addendum)
Transition of Care [facility]) - Initial/Assessment Note    Patient Details  Name: [name] Long MRN: [id number] Date of Birth: Mar 22, 1951  [facility] CM/SW Contact:    Delilah Shan, LCSWA Phone Number: 11/03/2022, 4:49 PM  Clinical Narrative:                  CSW received consult from RN. Patients daughter Elonda Husky request for CSW to reach out to her. CSW spoke with [place]. Cassandra reports PTA patient  comes from home with her. Patients daughter had some medical questions in regards to patient. CSW informed Elonda Husky that CSW will let MD know to follow up with her. CSW informed Cassandra TOC will follow up with her in regards to disposition needs.CSW will provide resource for [facility] for additional mental health resources. This CSW is unable to provide any information for the ACT team, [facility] will be able to provide more direction.Contact information added to the AVS.All questions answered. No further questions reported at this time.       Patient Goals and CMS Choice            Expected Discharge Plan and Services                                              Prior Living Arrangements/Services                       Activities of Daily Living Home Assistive Devices/Equipment: Cane (specify quad or straight) ADL Screening (condition at time of admission) Patient's cognitive ability adequate to safely complete daily activities?: No Is the patient deaf or have difficulty hearing?: No Does the patient have difficulty seeing, even when wearing glasses/contacts?: No Does the patient have difficulty concentrating, remembering, or making decisions?: Yes Patient able to express need for assistance with ADLs?: No Does the patient have difficulty dressing or bathing?: Yes Independently performs ADLs?: No Communication: Independent Dressing (OT): Needs assistance Is this a change from baseline?: Pre-admission baseline Grooming: Needs  assistance Is this a change from baseline?: Pre-admission baseline Feeding: Independent Bathing: Needs assistance Is this a change from baseline?: Pre-admission baseline In/Out Bed: Needs assistance Is this a change from baseline?: Pre-admission baseline Walks in Home: Independent Does the patient have difficulty walking or climbing stairs?: No Weakness of Legs: Both Weakness of Arms/Hands: None  Permission Sought/Granted                  Emotional Assessment              Admission diagnosis:  Acute coronary syndrome (HCC) [I24.9] ACS (acute coronary syndrome) (HCC) [I24.9] Patient Active Problem List   Diagnosis Date Noted   Acute coronary syndrome (HCC) 11/03/2022   Schizoaffective disorder, bipolar type (HCC) 03/21/2022   NSTEMI (non-ST elevated myocardial infarction) (HCC) 03/07/2022   CKD (chronic kidney disease) stage 3, GFR 30-59 ml/min (HCC) 03/07/2022   Chronic diastolic CHF (congestive heart failure) (HCC) 03/07/2022   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) 03/27/2020   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   Atrial flutter with rapid ventricular response (HCC) 07/10/2017   Atrial fibrillation (HCC) 07/05/2017   COPD (chronic obstructive pulmonary disease) (HCC) 01/22/2017   Congestive heart failure (CHF) (HCC)    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 08/15/2016   Osteoarthritis, multiple  sites 06/08/2011   CHRONIC KIDNEY DISEASE STAGE II (MILD) 09/14/2009   Hypothyroidism 08/31/2006   HYPERTENSION, BENIGN SYSTEMIC 08/31/2006   PCP:  Program, [facility]:   [facility] [phone] - [place], [place] - [address] AT [facility] [address] [place] [address] Phone: [phone] Fax: [phone]  [facility] #[id number] [place], [place] - [address] AT [facility] OF [address] &  [address] [place] [address] Phone: [phone] Fax: [phone]  [facility] #[id number] [place],  - [address] AT [facility] OF [facility] & [address] [place] [address] Phone: [phone] Fax: [phone]     Social Determinants of Health (SDOH) Social History: SDOH Screenings   Food Insecurity: No Food Insecurity (11/03/2022)  Housing: Low Risk  (11/03/2022)  Transportation Needs: No Transportation Needs (11/03/2022)  Utilities: Not At Risk (11/03/2022)  Alcohol Screen: Low Risk  (04/19/2021)  Depression (PHQ2-9): Low Risk  (03/27/2020)  Recent Concern: Depression (PHQ2-9) - Medium Risk (03/23/2020)  Financial Resource Strain: Low Risk  (07/18/2019)  Physical Activity: Inactive (07/18/2019)  Social Connections: Moderately Integrated (07/18/2019)  Stress: No Stress Concern Present (07/18/2019)  Tobacco Use: High Risk (11/03/2022)   SDOH Interventions:     Readmission Risk Interventions     No data to display

## 2022-11-03 NOTE — Progress Notes (Signed)
-    CBC and CMP stable, patient is currently in the hospital.

## 2022-11-03 NOTE — H&P (Signed)
Referring Physician: Tilden Fossa, MD/Robert Dahlia Client, PA-C  [name] Long is an 72 y.o. female.                       Chief Complaint: Shortness of breath  HPI: 72 years old black female with PMH of type 2 DM, HTN, HLD, hypothyroidism, Schizophrenia, COPD, GERD, CKD, IV has progressive leg edema and shortness of breath.  Her Chest x-ray shows cardiomegaly.  EKG shows atrial fibrillation with diffuse ischemia and old ASWMI. Troponin I is elevated suggestive of NSTEMI. BNP is elevated.  Past Medical History:  Diagnosis Date   Abscessed tooth 02/09/2020   Acute on chronic congestive heart failure (HCC)    Adenomatous colon polyp    Arthritis    "real bad; all over" (07/12/2017)   Asthma    Atrial fibrillation (HCC)    BIPOLAR DISORDER 08/31/2006   Qualifier: Diagnosis of  By: Irving Burton MD, Clifton Custard     CHRONIC KIDNEY DISEASE STAGE II (MILD) 09/14/2009   Annotation: eGFR 64 Qualifier: Diagnosis of  By: Louanne Belton MD, Rolm Gala     Chronic lower back pain    Congestive heart failure (CHF) (HCC)    COPD (chronic obstructive pulmonary disease) (HCC) 09/23/2010   Diagnosed at [facility] in 2008 (Dr. Maple Hudson)    DM (diabetes mellitus) type II controlled with renal manifestation (HCC) 08/31/2006   Qualifier: Diagnosis of  By: Irving Burton MD, Clifton Custard     Dyspnea    "all my life; since 6th grade" (07/12/2017)   Fatty liver    HYPERCHOLESTEROLEMIA 08/31/2006   Intolerance to Lipitor OK on Crestor but medicaid no longer covering     HYPERTENSION, BENIGN SYSTEMIC 08/31/2006   Qualifier: Diagnosis of  By: Irving Burton MD, Aaron     Hypokalemia    HYPOTHYROIDISM, UNSPECIFIED 08/31/2006   Qualifier: Diagnosis of  By: Irving Burton MD, Clifton Custard     Leg swelling 03/14/2018   Pneumonia    "3 times" (07/12/2017)   Pulmonary nodule    Renal cyst    Schizophrenia (HCC)    Scoliosis    Stomach problems    Thyroid disorder       Past Surgical History:  Procedure Laterality Date   CESAREAN SECTION     FOOT FRACTURE SURGERY  Right    "steel plate in it"   FOOT SURGERY     " born w/dislocated foot"   FRACTURE SURGERY     KNEE ARTHROSCOPY Right    TOE SURGERY Bilateral    "both pinky toes"   TONSILLECTOMY AND ADENOIDECTOMY      Family History  Problem Relation Age of Onset   Heart disease Mother 76   Rectal cancer Mother    Diabetes Mother    High Cholesterol Mother    Hypertension Mother    Stroke Mother    Diabetes Father 3   Hypertension Father    Breast cancer Sister    High Cholesterol Sister    Hypertension Sister    Breast cancer Sister    Heart disease Brother    High Cholesterol Brother    Hypertension Brother    Colon cancer Maternal Grandmother    Asthma Daughter    Asthma Daughter    Asthma Son    Stomach cancer Neg Hx    Esophageal cancer Neg Hx    Pancreatic cancer Neg Hx    Social History:  reports that she has been smoking cigarettes. She has a 45.00 pack-year smoking history. She has  never used smokeless tobacco. She reports that she does not currently use alcohol. She reports that she does not use drugs.  Allergies:  Allergies  Allergen Reactions   Citrus Anaphylaxis and Itching   Fish Allergy Anaphylaxis and Other (See Comments)   Shellfish Allergy Shortness Of Breath and Other (See Comments)    "Affects thyroid" also   Adhesive [Tape] Other (See Comments)    Must have paper tape only   Ibuprofen Swelling and Other (See Comments)    Face swells   Latex Dermatitis   Lipitor [Atorvastatin Calcium] Other (See Comments)    Body aches   Lisinopril Other (See Comments)    PER DR. Sherene Sires (not recalled by patient)   Other Nausea Only and Other (See Comments)    Collards (gas, too)   Codeine Rash   Tramadol Palpitations    (Not in a hospital admission)   Results for orders placed or performed during the hospital encounter of 11/02/22 (from the past 48 hour(s))  CBC with Differential     Status: Abnormal   Collection Time: 11/02/22 11:29 PM  Result Value Ref Range    WBC 10.2 4.0 - 10.5 K/uL   RBC 3.03 (L) 3.87 - 5.11 MIL/uL   Hemoglobin 10.1 (L) 12.0 - 15.0 g/dL   HCT 16.1 (L) 09.6 - 04.5 %   MCV 98.0 80.0 - 100.0 fL   MCH 33.3 26.0 - 34.0 pg   MCHC 34.0 30.0 - 36.0 g/dL   RDW 40.9 (H) 81.1 - 91.4 %   Platelets 228 150 - 400 K/uL   nRBC 0.0 0.0 - 0.2 %   Neutrophils Relative % 64 %   Neutro Abs 6.5 1.7 - 7.7 K/uL   Lymphocytes Relative 18 %   Lymphs Abs 1.8 0.7 - 4.0 K/uL   Monocytes Relative 18 %   Monocytes Absolute 1.8 (H) 0.1 - 1.0 K/uL   Eosinophils Relative 0 %   Eosinophils Absolute 0.0 0.0 - 0.5 K/uL   Basophils Relative 0 %   Basophils Absolute 0.0 0.0 - 0.1 K/uL   Immature Granulocytes 0 %   Abs Immature Granulocytes 0.04 0.00 - 0.07 K/uL    Comment: Performed at [facility], [address], [place], [place] [address]  Comprehensive metabolic panel     Status: Abnormal   Collection Time: 11/02/22 11:29 PM  Result Value Ref Range   Sodium 134 (L) 135 - 145 mmol/L   Potassium 3.2 (L) 3.5 - 5.1 mmol/L   Chloride 103 98 - 111 mmol/L   CO2 21 (L) 22 - 32 mmol/L   Glucose, Bld 181 (H) 70 - 99 mg/dL    Comment: Glucose reference range applies only to samples taken after fasting for at least 8 hours.   BUN 15 8 - 23 mg/dL   Creatinine, Ser 6.21 (H) 0.44 - 1.00 mg/dL   Calcium 8.4 (L) 8.9 - 10.3 mg/dL   Total Protein 6.7 6.5 - 8.1 g/dL   Albumin 2.8 (L) 3.5 - 5.0 g/dL   AST 21 15 - 41 U/L   ALT 29 0 - 44 U/L   Alkaline Phosphatase 76 38 - 126 U/L   Total Bilirubin 0.4 0.3 - 1.2 mg/dL   GFR, Estimated 40 (L) >60 mL/min    Comment: (NOTE) Calculated using the CKD-EPI Creatinine Equation (2021)    Anion gap 10 5 - 15    Comment: Performed at [facility], [address], [place], [place] [address]  Brain natriuretic peptide     Status: Abnormal   Collection Time: 11/02/22 11:29 PM  Result Value Ref Range   B Natriuretic Peptide 583.7 (H) 0.0 - 100.0 pg/mL    Comment: Performed at [facility], [address] [place], [place] [address]  Troponin I (High Sensitivity)     Status: Abnormal   Collection Time: 11/02/22 11:29 PM  Result Value Ref Range   Troponin I (High Sensitivity) 1,113 (HH) <18 ng/L    Comment: CRITICAL RESULT CALLED TO, READ BACK BY AND VERIFIED WITH Ronnell Guadalajara RN 11/03/22 0015 AMIEHSANI F (NOTE) Elevated high sensitivity troponin I (hsTnI) values and significant  changes across serial measurements may suggest ACS but many other  chronic and acute conditions are known to elevate hsTnI results.  Refer to the Links section for chest pain algorithms and additional  guidance. Performed at [facility], [address], [place], [place] [address]   I-stat chem 8, ED (not at [facility], [facility] or [facility])     Status: Abnormal   Collection Time: 11/02/22 11:34 PM  Result Value Ref Range   Sodium 135 135 - 145 mmol/L   Potassium 3.2 (L) 3.5 - 5.1 mmol/L   Chloride 104 98 - 111 mmol/L   BUN 15 8 - 23 mg/dL   Creatinine, Ser 8.29 (H) 0.44 - 1.00 mg/dL   Glucose, Bld 562 (H) 70 - 99 mg/dL    Comment: Glucose reference range applies only to samples taken after fasting for at least 8 hours.   Calcium, Ion 1.09 (L) 1.15 - 1.40 mmol/L   TCO2 21 (L) 22 - 32 mmol/L   Hemoglobin 11.6 (L) 12.0 - 15.0 g/dL   HCT 13.0 (L) 86.5 - 78.4 %   *Note: Due to a large number of results and/or encounters for the requested time period, some results have not been displayed. A complete set of results can be found in Results Review.   DG Chest Port 1 View  Result Date: 11/02/2022 CLINICAL DATA:  Cough.  Shortness of breath. EXAM: PORTABLE CHEST 1 VIEW COMPARISON:  Chest radiograph dated 03/07/2022. FINDINGS: Bibasilar atelectasis. No focal consolidation, pleural effusion, or pneumothorax. Stable cardiomegaly. Atherosclerotic calcification of the aortic arch. No acute osseous pathology. Degenerative changes of the spine and scoliosis. IMPRESSION: Bibasilar atelectasis. No focal consolidation. Electronically Signed    By: Elgie Collard M.D.   On: 11/02/2022 23:18    Review Of Systems Constitutional: No fever, chills, weight loss or gain. Eyes: No vision change, wears glasses. No discharge or pain. Ears: No hearing loss, No tinnitus. Respiratory: No asthma, positive COPD, pneumonias. Positive shortness of breath. No hemoptysis. Cardiovascular: Positive chest pain, palpitation, leg edema. Gastrointestinal: No nausea, vomiting, diarrhea, constipation. No GI bleed. No hepatitis. Genitourinary: No dysuria, hematuria, kidney stone. No incontinance. CKD. Neurological: No headache, stroke, seizures.  Psychiatry: Positive psych facility admission for anxiety, depression, suicide. No detox. Skin: No rash. Musculoskeletal: Positive joint pain, fibromyalgia. No neck pain, back pain. Lymphadenopathy: No lymphadenopathy. Hematology: No anemia or easy bruising.   Blood pressure 98/62, pulse 90, temperature 98.8 F (37.1 C), temperature source Oral, resp. rate (!) 24, height 5\' 5"  (1.651 m), weight 66 kg, SpO2 100 %. Body mass index is 24.21 kg/m. General appearance: Sleepy, cooperative, appears stated age and moderate distress Head: Normocephalic, atraumatic. Eyes: Brown eyes, pink conjunctiva, corneas clear. PERRL, EOM's intact. Neck: No adenopathy, no carotid bruit, Positive JVD, supple, symmetrical, trachea midline and thyroid not enlarged. Resp: Basal  crackles to auscultation bilaterally. Cardio: Regular rate and rhythm, S1, S2 normal, II/VI systolic murmur, no click, rub or gallop GI: Soft, non-tender; bowel sounds normal; no organomegaly. Extremities: 1 + lower leg edema, no cyanosis or clubbing. Skin: Warm and dry.  Neurologic: Alert and oriented X 0, normal strength.  Assessment/Plan NSTEMI Acute systolic left heart failure Type 2 DM HTN HLD CKD, IV Hypothyroidism Schizophrenia with manic episode COPD  Plan: Admit. IV heparin IV Lasix one dose to start Echocardiogram Inotropic treatment  if has severe LV dysfunction. May undergo R + L heart catheterization when stable physically and mentally.    Time spent: Review of old records, Lab, x-rays, EKG, other cardiac tests, examination, discussion with patient/Family over 70 minutes.  Ricki Rodriguez, MD  11/03/2022, 1:39 AM

## 2022-11-03 NOTE — ED Notes (Signed)
Help get patient bed changed and brief put clean sheets placed a brief and a external cath patient is resting with call bell in reach and family at bedside

## 2022-11-03 NOTE — Plan of Care (Signed)

## 2022-11-04 ENCOUNTER — Encounter (HOSPITAL_COMMUNITY): Payer: Self-pay | Admitting: Student

## 2022-11-04 ENCOUNTER — Inpatient Hospital Stay (HOSPITAL_COMMUNITY): Payer: Medicare Other

## 2022-11-04 DIAGNOSIS — I249 Acute ischemic heart disease, unspecified: Secondary | ICD-10-CM

## 2022-11-04 DIAGNOSIS — R413 Other amnesia: Secondary | ICD-10-CM

## 2022-11-04 DIAGNOSIS — F250 Schizoaffective disorder, bipolar type: Secondary | ICD-10-CM

## 2022-11-04 LAB — COMPREHENSIVE METABOLIC PANEL
ALT: 25 U/L (ref 0–44)
AST: 21 U/L (ref 15–41)
Albumin: 2.6 g/dL — ABNORMAL LOW (ref 3.5–5.0)
Alkaline Phosphatase: 86 U/L (ref 38–126)
Anion gap: 10 (ref 5–15)
BUN: 25 mg/dL — ABNORMAL HIGH (ref 8–23)
CO2: 24 mmol/L (ref 22–32)
Calcium: 8.2 mg/dL — ABNORMAL LOW (ref 8.9–10.3)
Chloride: 98 mmol/L (ref 98–111)
Creatinine, Ser: 1.51 mg/dL — ABNORMAL HIGH (ref 0.44–1.00)
GFR, Estimated: 37 mL/min — ABNORMAL LOW (ref >60)
Glucose, Bld: 175 mg/dL — ABNORMAL HIGH (ref 70–99)
Potassium: 3.2 mmol/L — ABNORMAL LOW (ref 3.5–5.1)
Sodium: 132 mmol/L — ABNORMAL LOW (ref 135–145)
Total Bilirubin: 0.2 mg/dL — ABNORMAL LOW (ref 0.3–1.2)
Total Protein: 6.5 g/dL (ref 6.5–8.1)

## 2022-11-04 LAB — GLUCOSE, CAPILLARY
Glucose-Capillary: 194 mg/dL — ABNORMAL HIGH (ref 70–99)
Glucose-Capillary: 286 mg/dL — ABNORMAL HIGH (ref 70–99)
Glucose-Capillary: 198 mg/dL — ABNORMAL HIGH (ref 70–99)
Glucose-Capillary: 220 mg/dL — ABNORMAL HIGH (ref 70–99)

## 2022-11-04 LAB — CBC WITH DIFFERENTIAL/PLATELET
Abs Immature Granulocytes: 0.02 10*3/uL (ref 0.00–0.07)
Basophils Absolute: 0 10*3/uL (ref 0.0–0.1)
Basophils Relative: 0 %
Eosinophils Absolute: 0 10*3/uL (ref 0.0–0.5)
Eosinophils Relative: 0 %
HCT: 28.6 % — ABNORMAL LOW (ref 36.0–46.0)
Hemoglobin: 9.5 g/dL — ABNORMAL LOW (ref 12.0–15.0)
Immature Granulocytes: 0 %
Lymphocytes Relative: 24 %
Lymphs Abs: 1.9 10*3/uL (ref 0.7–4.0)
MCH: 32.2 pg (ref 26.0–34.0)
MCHC: 33.2 g/dL (ref 30.0–36.0)
MCV: 96.9 fL (ref 80.0–100.0)
Monocytes Absolute: 1.1 10*3/uL — ABNORMAL HIGH (ref 0.1–1.0)
Monocytes Relative: 14 %
Neutro Abs: 4.8 10*3/uL (ref 1.7–7.7)
Neutrophils Relative %: 62 %
Platelets: 228 10*3/uL (ref 150–400)
RBC: 2.95 MIL/uL — ABNORMAL LOW (ref 3.87–5.11)
RDW: 16.6 % — ABNORMAL HIGH (ref 11.5–15.5)
WBC: 7.8 10*3/uL (ref 4.0–10.5)
nRBC: 0 % (ref 0.0–0.2)

## 2022-11-04 LAB — MAGNESIUM: Magnesium: 2.1 mg/dL (ref 1.7–2.4)

## 2022-11-04 LAB — VITAMIN B12: Vitamin B-12: 367 pg/mL (ref 180–914)

## 2022-11-04 LAB — TROPONIN I (HIGH SENSITIVITY): Troponin I (High Sensitivity): 670 ng/L (ref <18)

## 2022-11-04 MED ORDER — INSULIN ASPART 100 UNIT/ML IJ SOLN
0.0000 [IU] | Freq: Three times a day (TID) | INTRAMUSCULAR | Status: DC
  Administered 2022-11-04: 8 [IU] via SUBCUTANEOUS
  Administered 2022-11-04: 3 [IU] via SUBCUTANEOUS
  Administered 2022-11-05: 2 [IU] via SUBCUTANEOUS
  Administered 2022-11-05 – 2022-11-06 (×2): 3 [IU] via SUBCUTANEOUS
  Administered 2022-11-06: 2 [IU] via SUBCUTANEOUS
  Administered 2022-11-06: 3 [IU] via SUBCUTANEOUS
  Administered 2022-11-07 (×2): 5 [IU] via SUBCUTANEOUS

## 2022-11-04 MED ORDER — LORAZEPAM 2 MG/ML IJ SOLN
1.0000 mg | Freq: Once | INTRAMUSCULAR | Status: DC
  Filled 2022-11-04: qty 1

## 2022-11-04 MED ORDER — ENOXAPARIN SODIUM 40 MG/0.4ML IJ SOSY
40.0000 mg | PREFILLED_SYRINGE | INTRAMUSCULAR | Status: DC
  Administered 2022-11-05 – 2022-11-07 (×2): 40 mg via SUBCUTANEOUS
  Filled 2022-11-04 (×2): qty 0.4

## 2022-11-04 MED ORDER — METHYLPREDNISOLONE SODIUM SUCC 125 MG IJ SOLR
125.0000 mg | Freq: Once | INTRAMUSCULAR | Status: AC
  Administered 2022-11-04: 125 mg via INTRAVENOUS
  Filled 2022-11-04: qty 2

## 2022-11-04 MED ORDER — ASPIRIN 81 MG PO TBEC
81.0000 mg | DELAYED_RELEASE_TABLET | Freq: Every day | ORAL | Status: DC
  Administered 2022-11-04 – 2022-11-07 (×4): 81 mg via ORAL
  Filled 2022-11-04 (×4): qty 1

## 2022-11-04 MED ORDER — INSULIN ASPART 100 UNIT/ML IJ SOLN
4.0000 [IU] | Freq: Three times a day (TID) | INTRAMUSCULAR | Status: DC
  Administered 2022-11-04 – 2022-11-07 (×8): 4 [IU] via SUBCUTANEOUS

## 2022-11-04 MED ORDER — FUROSEMIDE 10 MG/ML IJ SOLN
80.0000 mg | Freq: Once | INTRAMUSCULAR | Status: AC
  Administered 2022-11-04: 80 mg via INTRAVENOUS
  Filled 2022-11-04: qty 8

## 2022-11-04 NOTE — Progress Notes (Signed)
Ref: Program, [facility]   Subjective:  Awake.  Mild respiratory distress. Refused some medications like Lovenox and lorazepam per nurse. Family looking for neurologist. Probably more benefit from psychiatrist at this point in time.  Objective:  Vital Signs in the last 24 hours: Temp:  [97.9 F (36.6 C)-98.4 F (36.9 C)] 98.4 F (36.9 C) (05/02 1956) Pulse Rate:  [83-92] 92 (05/03 0026) Cardiac Rhythm: Atrial fibrillation (05/02 1900) Resp:  [16-24] 16 (05/03 0026) BP: (89-125)/(55-84) 108/72 (05/03 0026) SpO2:  [97 %-100 %] 97 % (05/03 0026) FiO2 (%):  [40 %] 40 % (05/02 0907)  Physical Exam: BP Readings from Last 1 Encounters:  11/04/22 108/72     Wt Readings from Last 1 Encounters:  11/03/22 66 kg    Weight change: 0 kg Body mass index is 24.21 kg/m. HEENT: /AT, Eyes-Brown, Conjunctiva-Pink, Sclera-Non-icteric Neck: Positive JVD, No bruit, Trachea midline. Lungs:  Coarse, Bilateral. Cardiac:  Regular rhythm, normal S1 and S2, no S3. II/VI systolic murmur. Abdomen:  Soft, non-tender. BS present. Extremities:  1 + edema present. No cyanosis. No clubbing. CNS: AxOx2, Cranial nerves grossly intact, moves all 4 extremities.  Skin: Warm and dry.   Intake/Output from previous day: 05/02 0701 - 05/03 0700 In: 117.2 [I.V.:117.2] Out: -     Lab Results: BMET    Component Value Date/Time   NA 134 (L) 11/03/2022 0546   NA 136 11/03/2022 0303   NA 135 11/02/2022 2334   NA 137 03/23/2020 1433   NA 141 03/12/2020 1529   NA 139 01/11/2019 1419   K 3.7 11/03/2022 0546   K 3.5 11/03/2022 0303   K 3.2 (L) 11/02/2022 2334   CL 103 11/03/2022 0546   CL 104 11/02/2022 2334   CL 103 11/02/2022 2329   CO2 20 (L) 11/03/2022 0546   CO2 21 (L) 11/02/2022 2329   CO2 23 11/02/2022 1524   GLUCOSE 243 (H) 11/03/2022 0546   GLUCOSE 174 (H) 11/02/2022 2334   GLUCOSE 181 (H) 11/02/2022 2329   BUN 15 11/03/2022 0546   BUN 15 11/02/2022 2334   BUN 15  11/02/2022 2329   BUN 25 03/23/2020 1433   BUN 16 03/12/2020 1529   BUN 17 01/11/2019 1419   CREATININE 1.37 (H) 11/03/2022 0546   CREATININE 1.50 (H) 11/02/2022 2334   CREATININE 1.41 (H) 11/02/2022 2329   CREATININE 1.20 (H) 11/02/2022 1524   CREATININE 1.52 (H) 08/02/2022 1449   CREATININE 1.35 (H) 06/03/2022 1528   CALCIUM 8.3 (L) 11/03/2022 0546   CALCIUM 8.4 (L) 11/02/2022 2329   CALCIUM 8.9 11/02/2022 1524   GFRNONAA 41 (L) 11/03/2022 0546   GFRNONAA 40 (L) 11/02/2022 2329   GFRNONAA 38 (L) 10/04/2022 1733   GFRNONAA 47 (L) 10/01/2020 1147   GFRNONAA 46 (L) 05/26/2020 1553   GFRNONAA 48 (L) 04/30/2020 1206   GFRAA 54 (L) 10/01/2020 1147   GFRAA 54 (L) 05/26/2020 1553   GFRAA 56 (L) 04/30/2020 1206   CBC    Component Value Date/Time   WBC 10.0 11/03/2022 0546   RBC 3.18 (L) 11/03/2022 0546   HGB 10.5 (L) 11/03/2022 0546   HCT 30.9 (L) 11/03/2022 0546   PLT 220 11/03/2022 0546   MCV 97.2 11/03/2022 0546   MCH 33.0 11/03/2022 0546   MCHC 34.0 11/03/2022 0546   RDW 17.1 (H) 11/03/2022 0546   LYMPHSABS 1.8 11/02/2022 2329   MONOABS 1.8 (H) 11/02/2022 2329   EOSABS 0.0 11/02/2022 2329   BASOSABS  0.0 11/02/2022 2329   HEPATIC Function Panel Recent Labs    03/08/22 0915 04/07/22 1200 08/02/22 1449 10/04/22 1733 11/02/22 1524 11/02/22 2329  PROT 6.7 6.6   < > 7.4 6.8 6.7  ALBUMIN 3.1*  --   --  3.3*  --  2.8*  AST 21 9*   < > 20 18 21   ALT 14 9   < > 13 26 29   ALKPHOS 76  --   --  70  --  76  BILIDIR  --  0.0  --   --   --   --   IBILI  --  0.1*  --   --   --   --    < > = values in this interval not displayed.   HEMOGLOBIN A1C Lab Results  Component Value Date   MPG 192 08/02/2022   CARDIAC ENZYMES Lab Results  Component Value Date   CKTOTAL 251 (H) 03/07/2022   CKMB 1.6 04/02/2022   TROPONINI <0.03 12/26/2016   TROPONINI <0.03 12/25/2016   BNP No results for input(s): "PROBNP" in the last 8760 hours. TSH Recent Labs    04/07/22 1200  10/04/22 1800 11/02/22 1524  TSH 1.56 0.987 1.02   CHOLESTEROL Recent Labs    03/08/22 0014 04/07/22 1200 11/03/22 0546  CHOL 215* 147 121    Scheduled Meds:  amLODipine  2.5 mg Oral Daily   dapagliflozin propanediol  5 mg Oral QAC breakfast   donepezil  5 mg Oral QHS   enoxaparin (LOVENOX) injection  70 mg Subcutaneous Q24H   furosemide  80 mg Intravenous Once   levothyroxine  50 mcg Oral QAC breakfast   linagliptin  5 mg Oral Daily   LORazepam  1 mg Intravenous Once   LORazepam  0.5 mg Oral TID   OLANZapine  20 mg Oral QHS   pantoprazole  40 mg Oral Daily   potassium chloride  10 mEq Oral TID   rosuvastatin  40 mg Oral Daily   sucralfate  1 g Oral BID WC   valproic acid  250 mg Oral BID   Continuous Infusions: PRN Meds:.acetaminophen, albuterol, nitroGLYCERIN, ondansetron (ZOFRAN) IV  Assessment/Plan: NSTEMI Acute systolic and diastolic left heart failure, HFpEF Moderate MR Mild TR Type 2 DM HTN HLD CKD, IV Hypothyroidism Schizophrenia with manic episode COPD  Plan: Add glycemic control. Ordered blood work this AM. Patients accepts blood draw. Psych consult. IV lasix one dose. Patient refuses cardiac cath for now.   LOS: 1 day   Time spent including chart review, lab review, examination, discussion with patient/Nurse/Family : 30 min   Orpah Cobb  MD  11/04/2022, 7:20 AM

## 2022-11-04 NOTE — Plan of Care (Signed)

## 2022-11-04 NOTE — Discharge Instructions (Addendum)
[facility] [phone]  [address] [place], [place] [address]  [facility] Dr. Jannifer Franklin - Neuropsychiatrist [address] [place], [place] [address]  [phone]  [facility] Dr[facility] - Geropsychiatrist [address] [place] [address] [place], [place] [address] [phone]  [facility] [address] [place], [place] [address] 2nd Floor is the outpatient office 1st Floor is the 24/7 psychiatric urgent care  OUTPATIENT Walk-in information: Please note, all walk-ins are first come & first serve, with limited number of availability. Therapist for therapy:  Monday & Wednesdays: Please ARRIVE at 7:15 AM for registration Will START at 8:00 AM Every 1st & 2nd Friday of the month: Please ARRIVE at 10:15 AM for registration Will START at 1 PM - 5 PM Psychiatrist for medication management: Monday - Friday:  Please ARRIVE at 7:15 AM for registration Will START at 8:00 AM  Regretfully, due to limited availability, please be aware that you may not been seen on the same day as walk-in. Please consider making an appoint or try again. Thank you for your patience and understanding.

## 2022-11-04 NOTE — Progress Notes (Signed)
Patient not available for EEG at the moment gone to imaging.  Tech will try back in an hour or as schedule allows.

## 2022-11-04 NOTE — Consult Note (Signed)
[facility] Psych[facility]ry Followup Face-to-Face Psychiatric Evaluation   Name: [name] Long DOB: [birth year] MRN: [id number] Service Date: Nov 04, 2022 LOS:  LOS: 1 day  Reason for Consult: Schizophrenia with medication non-compliance and aggressive behaviors.  Referring Provider: [name]  Assessment  [name] Long is a 72 y.o. 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 Troponin I is elevated suggestive of NSTEMI. BNP is elevated. She carries the psychiatric diagnoses of schizoaffective disorder-bipolar type and has a past medical history of hypothyroidism, T2DM, HTN, COPD, CKD stage III, and chronic diastolic CHF.  Patient presents with pleasant mood and affect.  Speech is pressured, tangential with delusions of grandeur (Having multiple doctoral degrees, several lucrative careers, "god can't do things without me") and paranoia that multiple people have been using her insurance card.  She explains that some of these people have schizophrenia and denies that she takes antipsychotic medications, "those are their medications and need to be taken off my list".   Patient denies depression, but endorses "shame" and grief related to the death of her parents, other family members and friends.  She reports decreased appetite and weight loss of 30 pounds over 4 years. She states that she sleeps well, although describes regular sleep pattern.  She is sleeping well at home.  Depending on her level "how long she talks with God.  She awakens at night and p.m. to take her nighttime medications with [employer] she then awakens every 3-4 hours and stays awake for again related to "how hard she uses sometimes without her.  "She is awake provide 5 mg.  She does admit to not being for approximately 2 hours twice a day. She reports good energy and concentration as well as being able  to enjoy activities when she is able.  She endorses being less active recently due arthritis, neuropathy and heart condition. She specifically denies any past self harm. She denies any active suicidal or homicidal ideation. She does not have access to weapons since she has moved in with her daughter.  She admits a history of getting aggressive with her daughter in regards to going to court to get back to being her own legal guardian.    Diagnoses:  Active Hospital problems: Principal Problem:   Acute coronary syndrome (HCC)     Plan  ## Safety and Observation Level:  - Based on my clinical evaluation, I estimate the patient to be at low risk of self harm in the current setting of safety unawareness This judgment is based on our ability to directly address suicide risk, implement suicide prevention strategies and develop a safety plan while the patient is in the clinical setting. Please contact our team if there is a concern that risk level has changed.   ## Medications:  -- Do NOT restart antipsychotics at this time until acute cardiac issues have resolved. -- Can CONTINUE Depakene 250 mg q 12 hours  -- I have placed an order for a valproic acid level, to get an assessment of patient's compliance.  This data may be skewed given that patient has already received 2 doses of Depakene while hospitalized. --Recommend use of benzodiazepines as needed for agitated behavior until antipsychotics can be restarted.   ## Medical Decision Making Capacity:  Patient has a legal guardian, daughter Elonda Husky.  ## Further Work-up:   -- most recent EKG on 5/2 documented atrial fibrillation with  QtC of 420  -- Pertinent labwork reviewed earlier this admission includes:  Elevated troponins, elevated BNP, hyponatremia, hypokalemia, hypocalcemia, abnormal renal function studies, anemia; lipid panel, PT INR,, TSH within normal limits UDS not collected   ## Disposition:  -- Patient will most likely  discharge to home.  There are no acute psychiatric concerns for safety of patient to self or others. -- Patient would benefit from dementia evaluation by neurology.  ## Behavioral / Environmental:  -- 1:1, delirium precautions DELIRIUM RECS 1: Minimize use of benzodiazepines, antihistamines, anticholinergics, and opiates as these may worsen delirium. 2:Assess, prevent and manage pain as lack of treatment can result in delirium.  3: Recommend consult to PT/OT if not already done. Early mobility and exercise has been shown to decrease duration of delirium.  4:Provide appropriate lighting and clear signage; a clock and calendar should be easily visible to the patient. 5:Monitor environmental factors. Reduce light and noise at night (close shades, turn off lights, turn off TV, etc). Correct any alterations in sleep cycle. 6: Reorient the patient to person, place, time and situation on each encounter.  7: Correct sensory deficits if possible (replace eye glasses, hearing aids, ect). 8: Avoid restraints. Severely delirious patients benefit from constant observation by a sitter. 9: Do not leave patient unattended.     ##Legal Status Voluntary, daughter is legal guardian  Thank you for this consult request. Recommendations have been communicated to the primary team.  -- Recommendations for potential providers patient can see after discharge had been placed inpatient instructions of discharge summary.  This patient does not currently require acute inpatient psychiatric care and does not currently meet [facility] involuntary commitment criteria. Psychiatry will sign off.  Please re-consult for any future acute psychiatric concerns and/for medication management once patient is medically stabilized.   Mariel Craft, MD    NEW history  Relevant Aspects of Hospital Course:  Admitted on 11/02/2022 for progressive leg edema and shortness of breath, cardiomegaly, atrial fibrillation with diffuse  ischemia and old ASWMI, elevated Troponin I and BNP suggestive of NSTEMI.  Psychiatry referral was requested for medication noncompliance for schizophrenia and aggressive behaviors.  Patient Report:  Patient seen at bedside, and initially speaking aloud to herself.  She endorses that she was thinking aloud, but sitter reports that patient has been responding to internal stimuli, telling the voices to "be quiet" when another person began to speak to her.  Today, patient denies SI, HI, and AVH despite appearing to respond to internal stimuli.  Overall, patient continues to be less paranoid and more logical, although she does derail into paranoid statements throughout the assessment.  At the advising of her nurse, we discuss starting the Invega Sustenna LAI.  Patient had questions about its indication, follow-up, and who would administer the injection, which were answered.  She was advised that after the first 2 loading doses, it would be a monthly medication, and in lieu of further pills.  She was then amenable to receiving the medication.  When her nurse attempted to give her the LAI a second time, patient declined stating that she was afraid of the needle. She reports fair sleep, good appetite, and denies acute concerns or complaints today.  Collateral information: Obtained by Dr. [name], PGY 2. Daughter, Elonda Husky [phone]):  Wanted patient to do a neurology work up, because family is concern for dementia.  [facility] admission first week of April and Dc'd 25th June 2024 for delusions, aggressive behavior and hallucinations.  Stated  that patient at baseline patient is no delusions or hallucinations. Last time at "baseline" was ~2016. Millie and Darel Hong were her neighbors,  Traumatic event where patient's husband died around 11/24/18.    At home she is combative and paranoid delusion at home. About    Dx: Schizoaffective (dx at in 11/23/20 at [facility]) Guardianship: Cassandra Inpatient psych:  ~8-9 Suicide: no Homicide: no Rx: Depakote, Zyprexa, Risperdal, Invega, lorazepam  Oct 2023 was the last time patient received LAI at [facility] Non-adherent to medication Psychiatrist:    Family Dad with dementia   Medical No h/o    Social Housing: Living with daughter, grandchild Guns/weapons: Denied Nicotine: cigarettes - 1/2 PPD on and off for years EtOH: None currently Illicit substances: Denied    Psychiatric History:  Information collected from patient, chart, daughter who is guardian  Family psych history: Dementia - father   Social History:  Lives with daughter, Elonda Husky  Tobacco use: Former 2 ppd smoker.  She states that she has restarted smoking 3 months ago apprx 3 cigarettes a day (1 pack per week) Alcohol use: Denies Drug use: Denies  Family History:  The patient's family history includes Asthma in her daughter, daughter, and son; Breast cancer in her sister and sister; Colon cancer in her maternal grandmother; Diabetes in her mother; Diabetes (age of onset: 65) in her father; Heart disease in her brother; Heart disease (age of onset: 75) in her mother; High Cholesterol in her brother, mother, and sister; Hypertension in her brother, father, mother, and sister; Rectal cancer in her mother; Stroke in her mother.  Medical History: Past Medical History:  Diagnosis Date   Abscessed tooth 02/09/2020   Acute on chronic congestive heart failure (HCC)    Adenomatous colon polyp    Arthritis    "real bad; all over" (07/12/2017)   Asthma    Atrial fibrillation (HCC)    BIPOLAR DISORDER 08/31/2006   Qualifier: Diagnosis of  By: Irving Burton MD, Clifton Custard     CHRONIC KIDNEY DISEASE STAGE II (MILD) 09/14/2009   Annotation: eGFR 64 Qualifier: Diagnosis of  By: Louanne Belton MD, Rolm Gala     Chronic lower back pain    Congestive heart failure (CHF) (HCC)    COPD (chronic obstructive pulmonary disease) (HCC) 09/23/2010   Diagnosed at [facility] 2006/11/24 (Dr. Maple Hudson)    DM (diabetes mellitus)  type II controlled with renal manifestation (HCC) 08/31/2006   Qualifier: Diagnosis of  By: Irving Burton MD, Clifton Custard     Dyspnea    "all my life; since 6th grade" (07/12/2017)   Fatty liver    HYPERCHOLESTEROLEMIA 08/31/2006   Intolerance to Lipitor OK on Crestor but medicaid no longer covering     HYPERTENSION, BENIGN SYSTEMIC 08/31/2006   Qualifier: Diagnosis of  By: Irving Burton MD, Aaron     Hypokalemia    HYPOTHYROIDISM, UNSPECIFIED 08/31/2006   Qualifier: Diagnosis of  By: Irving Burton MD, Clifton Custard     Leg swelling 03/14/2018   Pneumonia    "3 times" (07/12/2017)   Pulmonary nodule    Renal cyst    Schizophrenia (HCC)    Scoliosis    Stomach problems    Thyroid disorder     Surgical History: Past Surgical History:  Procedure Laterality Date   CESAREAN SECTION     FOOT FRACTURE SURGERY Right    "steel plate in it"   FOOT SURGERY     " born w/dislocated foot"   FRACTURE SURGERY     KNEE ARTHROSCOPY Right  TOE SURGERY Bilateral    "both pinky toes"   TONSILLECTOMY AND ADENOIDECTOMY      Medications:   Current Facility-Administered Medications:    acetaminophen (TYLENOL) tablet 650 mg, 650 mg, Oral, Q4H PRN, Orpah Cobb, MD, 650 mg at 11/04/22 1310   albuterol (PROVENTIL) (2.5 MG/3ML) 0.083% nebulizer solution 3 mL, 3 mL, Inhalation, QID PRN, Orpah Cobb, MD, 3 mL at 11/04/22 0642   amLODipine (NORVASC) tablet 2.5 mg, 2.5 mg, Oral, Daily, Orpah Cobb, MD, 2.5 mg at 11/04/22 0809   aspirin EC tablet 81 mg, 81 mg, Oral, Daily, Orpah Cobb, MD, 81 mg at 11/04/22 0808   dapagliflozin propanediol (FARXIGA) tablet 5 mg, 5 mg, Oral, QAC breakfast, Orpah Cobb, MD, 5 mg at 11/04/22 0811   [START ON 11/05/2022] enoxaparin (LOVENOX) injection 40 mg, 40 mg, Subcutaneous, Q24H, Algie Coffer, Ajay, MD   enoxaparin (LOVENOX) injection 70 mg, 70 mg, Subcutaneous, Q24H, Algie Coffer, Ajay, MD, 70 mg at 11/03/22 1530   insulin aspart (novoLOG) injection 0-15 Units, 0-15 Units, Subcutaneous, TID WC,  Orpah Cobb, MD, 8 Units at 11/04/22 1309   insulin aspart (novoLOG) injection 4 Units, 4 Units, Subcutaneous, TID WC, Orpah Cobb, MD, 4 Units at 11/04/22 1309   levothyroxine (SYNTHROID) tablet 50 mcg, 50 mcg, Oral, QAC breakfast, Orpah Cobb, MD, 50 mcg at 11/04/22 1610   linagliptin (TRADJENTA) tablet 5 mg, 5 mg, Oral, Daily, Orpah Cobb, MD, 5 mg at 11/04/22 0809   LORazepam (ATIVAN) injection 1 mg, 1 mg, Intravenous, Once, Orpah Cobb, MD   LORazepam (ATIVAN) tablet 0.5 mg, 0.5 mg, Oral, TID, Orpah Cobb, MD, 0.5 mg at 11/03/22 1530   nitroGLYCERIN (NITROSTAT) SL tablet 0.4 mg, 0.4 mg, Sublingual, Q5 Min x 3 PRN, Orpah Cobb, MD   ondansetron (ZOFRAN) injection 4 mg, 4 mg, Intravenous, Q6H PRN, Orpah Cobb, MD   pantoprazole (PROTONIX) EC tablet 40 mg, 40 mg, Oral, Daily, Algie Coffer, Ajay, MD, 40 mg at 11/04/22 0809   potassium chloride (KLOR-CON M) CR tablet 10 mEq, 10 mEq, Oral, TID, Orpah Cobb, MD, 10 mEq at 11/04/22 0809   rosuvastatin (CRESTOR) tablet 40 mg, 40 mg, Oral, Daily, Algie Coffer, Ajay, MD, 40 mg at 11/04/22 0809   sucralfate (CARAFATE) 1 GM/10ML suspension 1 g, 1 g, Oral, BID WC, Algie Coffer, Ajay, MD, 1 g at 11/04/22 0810   valproic acid (DEPAKENE) 250 MG capsule 250 mg, 250 mg, Oral, BID, Orpah Cobb, MD, 250 mg at 11/04/22 0810  Allergies: Allergies  Allergen Reactions   Citrus Anaphylaxis and Itching   Fish Allergy Anaphylaxis and Other (See Comments)   Shellfish Allergy Shortness Of Breath and Other (See Comments)    "Affects thyroid" also   Adhesive [Tape] Other (See Comments)    Must have paper tape only   Ibuprofen Swelling and Other (See Comments)    Face swells   Latex Dermatitis   Lipitor [Atorvastatin Calcium] Other (See Comments)    Body aches   Lisinopril Other (See Comments)    PER DR. Sherene Sires (not recalled by patient)   Other Nausea Only and Other (See Comments)    Collards (gas, too)   Codeine Rash   Tramadol Palpitations        Objective  Vital signs:  Temp:  [97.8 F (36.6 C)-98.4 F (36.9 C)] 97.8 F (36.6 C) (05/03 1132) Pulse Rate:  [84-100] 100 (05/03 1132) Resp:  [16-20] 19 (05/03 1132) BP: (93-114)/(59-89) 114/89 (05/03 1132) SpO2:  [94 %-99 %] 94 % (05/03 1132) Weight:  [66.8 kg] 66.8  kg (05/03 0706)  Psychiatric Specialty Exam:  Presentation  General Appearance: Casual; Fairly Groomed  Eye Contact:Good  Speech: pressured  Speech Volume: normal   Handedness:Right   Mood and Affect  Mood:Euthymic  Affect:Congruent   Thought Process  Thought Processes:Disorganized  Descriptions of Associations:Tangential  Orientation:Full (Time, Place and Person)  Thought Content:Illogical; delusions   History of Schizophrenia/Schizoaffective disorder:Yes  Duration of Psychotic Symptoms:Greater than six months  Hallucinations:Hallucinations: Auditory; Visual Description of Auditory Hallucinations: hears music Description of Visual Hallucinations: sees shadows running across the room     Ideas of Reference:Delusions (believes that multiple people are using her insurance and she has only given permission for a few of them to use her insurance.)  Suicidal Thoughts:Suicidal Thoughts: No     Homicidal Thoughts:Homicidal Thoughts: No     Sensorium  Memory:Immediate Poor; Recent Poor; Remote Poor  Judgment:Impaired  Insight:None   Executive Functions  Concentration:Fair  Attention Span:Fair  Recall:Poor  Fund of Knowledge:Fair  Language:Good   Psychomotor Activity  Psychomotor Activity:Psychomotor Activity: Normal     Assets  Assets:Housing; Health and safety inspector; Social Support   Sleep  Sleep:Sleep: Fair (irregular:  Sleeps 7-11 PM then awake every 3-4 hours, awake for day at 4-5 AM and naps twice/day for 2 hours)      Physical Exam: Physical Exam Vitals reviewed.  Constitutional:      General: She is not in acute distress.    Appearance:  Normal appearance. She is well-developed. She is not toxic-appearing.  HENT:     Head: Normocephalic and atraumatic.     Mouth/Throat:     Comments: Some abnormal oral movements intermittently.  Tongue protrudes midline without tremor Cardiovascular:     Rate and Rhythm: Tachycardia present.  Pulmonary:     Effort: Pulmonary effort is normal. No accessory muscle usage.  Musculoskeletal:        General: Normal range of motion.     Cervical back: Normal range of motion.     Comments: No tremor, no drift  Neurological:     General: No focal deficit present.     Mental Status: She is alert and oriented to person, place, and time.  Psychiatric:        Mood and Affect: Mood normal. Mood is not anxious.        Behavior: Behavior is not agitated.    Review of Systems  Constitutional: Negative.   Respiratory:  Negative for cough and shortness of breath.   Cardiovascular:  Positive for leg swelling. Negative for chest pain and palpitations.  Neurological:  Negative for tremors and headaches.  Psychiatric/Behavioral:  Positive for hallucinations (hears music and sees shadows). Negative for depression, substance abuse and suicidal ideas. The patient is not nervous/anxious and does not have insomnia.    Blood pressure 114/89, pulse 100, temperature 97.8 F (36.6 C), temperature source Oral, resp. rate 19, height 5\' 5"  (1.651 m), weight 66.8 kg, SpO2 94 %. Body mass index is 24.51 kg/m.

## 2022-11-04 NOTE — Consult Note (Signed)
Neurology Consultation  Reason for Consult: Altered mental status, concern for worsening dementia. Referring Physician: [name]  CC: Hallucinations, altered mental status, concern for worsening dementia  History is obtained from: Chart, patient  HPI: [name] Long is a 72 y.o. female past medical history of type 2 diabetes, hypertension, hyperlipidemia, CKD 4, CHF, COPD, schizophrenia, possible cognitive deficits, admitted for shortness of breath and progressive leg edema with possible CHF exacerbation being treated by cardiology, with concerns of worsening mentation. According to the primary team provider [name], the family is concerned that the patient is having worsening of her cognition, hallucinations and mental faculties for which she wanted [place] to evaluate the patient. The patient does not feel that she has any real memory or cognitive issues at this time. She was comfortably eating her meal when I examined her this afternoon. They are concerned about her worsening mentation and memory has been brought about by the patient's family to the primary team. No family member was available at bedside at this time for me to get collateral.  The patient has been recommended to get a cardiac catheterization which she has refused. Psychiatry is following the patient, and have recommended Depakote 250 every 12 hours with recommendation to not initiate antipsychotics till the acute cardiac issues have been resolved.  During the encounter, when asked questions specific to neurological history, she kept naming names of people who have in the past been conspiring against her.  When asked who the president is, she said it is Company secretary and he is trying to "shut down [place]".  When I asked her why, she started naming a few people who were involved in the scheme to do certain things which I could not really understand.  Review of notes reveals admissions for delusions, aggressive  behavior and hallucinations in the past.  According to the notes, last time patient was baseline mentation was somewhat in 12-08-2014, and has probably had traumatic events with patient's husband's death around 2018/12/08 and since then has been combative and paranoid.   ROS: Full ROS was performed and is negative except as noted in the HPI.   Past Medical History:  Diagnosis Date   Abscessed tooth 02/09/2020   Acute on chronic congestive heart failure (HCC)    Adenomatous colon polyp    Arthritis    "real bad; all over" (07/12/2017)   Asthma    Atrial fibrillation (HCC)    BIPOLAR DISORDER 08/31/2006   Qualifier: Diagnosis of  By: Irving Burton MD, Clifton Custard     CHRONIC KIDNEY DISEASE STAGE II (MILD) 09/14/2009   Annotation: eGFR 64 Qualifier: Diagnosis of  By: Louanne Belton MD, Rolm Gala     Chronic lower back pain    Congestive heart failure (CHF) (HCC)    COPD (chronic obstructive pulmonary disease) (HCC) 09/23/2010   Diagnosed at [facility] in 08-Dec-2006 (Dr. Maple Hudson)    DM (diabetes mellitus) type II controlled with renal manifestation (HCC) 08/31/2006   Qualifier: Diagnosis of  By: Irving Burton MD, Clifton Custard     Dyspnea    "all my life; since 6th grade" (07/12/2017)   Fatty liver    HYPERCHOLESTEROLEMIA 08/31/2006   Intolerance to Lipitor OK on Crestor but medicaid no longer covering     HYPERTENSION, BENIGN SYSTEMIC 08/31/2006   Qualifier: Diagnosis of  By: Irving Burton MD, Aaron     Hypokalemia    HYPOTHYROIDISM, UNSPECIFIED 08/31/2006   Qualifier: Diagnosis of  By: Irving Burton MD, Clifton Custard     Leg swelling 03/14/2018  Pneumonia    "3 times" (07/12/2017)   Pulmonary nodule    Renal cyst    Schizophrenia (HCC)    Scoliosis    Stomach problems    Thyroid disorder      Family History  Problem Relation Age of Onset   Heart disease Mother 70   Rectal cancer Mother    Diabetes Mother    High Cholesterol Mother    Hypertension Mother    Stroke Mother    Diabetes Father 74   Hypertension Father    Breast cancer Sister    High  Cholesterol Sister    Hypertension Sister    Breast cancer Sister    Heart disease Brother    High Cholesterol Brother    Hypertension Brother    Colon cancer Maternal Grandmother    Asthma Daughter    Asthma Daughter    Asthma Son    Stomach cancer Neg Hx    Esophageal cancer Neg Hx    Pancreatic cancer Neg Hx      Social History:   reports that she has been smoking cigarettes. She has a 45.00 pack-year smoking history. She has never used smokeless tobacco. She reports that she does not currently use alcohol. She reports that she does not use drugs.  Medications  Current Facility-Administered Medications:    acetaminophen (TYLENOL) tablet 650 mg, 650 mg, Oral, Q4H PRN, Orpah Cobb, MD, 650 mg at 11/04/22 1310   albuterol (PROVENTIL) (2.5 MG/3ML) 0.083% nebulizer solution 3 mL, 3 mL, Inhalation, QID PRN, Orpah Cobb, MD, 3 mL at 11/04/22 0642   amLODipine (NORVASC) tablet 2.5 mg, 2.5 mg, Oral, Daily, Orpah Cobb, MD, 2.5 mg at 11/04/22 0809   aspirin EC tablet 81 mg, 81 mg, Oral, Daily, Orpah Cobb, MD, 81 mg at 11/04/22 0808   dapagliflozin propanediol (FARXIGA) tablet 5 mg, 5 mg, Oral, QAC breakfast, Orpah Cobb, MD, 5 mg at 11/04/22 0811   [START ON 11/05/2022] enoxaparin (LOVENOX) injection 40 mg, 40 mg, Subcutaneous, Q24H, Algie Coffer, Ajay, MD   enoxaparin (LOVENOX) injection 70 mg, 70 mg, Subcutaneous, Q24H, Algie Coffer, Ajay, MD, 70 mg at 11/03/22 1530   insulin aspart (novoLOG) injection 0-15 Units, 0-15 Units, Subcutaneous, TID WC, Orpah Cobb, MD, 8 Units at 11/04/22 1309   insulin aspart (novoLOG) injection 4 Units, 4 Units, Subcutaneous, TID WC, Orpah Cobb, MD, 4 Units at 11/04/22 1309   levothyroxine (SYNTHROID) tablet 50 mcg, 50 mcg, Oral, QAC breakfast, Orpah Cobb, MD, 50 mcg at 11/04/22 1610   linagliptin (TRADJENTA) tablet 5 mg, 5 mg, Oral, Daily, Orpah Cobb, MD, 5 mg at 11/04/22 0809   LORazepam (ATIVAN) injection 1 mg, 1 mg, Intravenous, Once, Orpah Cobb, MD   LORazepam (ATIVAN) tablet 0.5 mg, 0.5 mg, Oral, TID, Orpah Cobb, MD, 0.5 mg at 11/03/22 1530   nitroGLYCERIN (NITROSTAT) SL tablet 0.4 mg, 0.4 mg, Sublingual, Q5 Min x 3 PRN, Orpah Cobb, MD   ondansetron (ZOFRAN) injection 4 mg, 4 mg, Intravenous, Q6H PRN, Orpah Cobb, MD   pantoprazole (PROTONIX) EC tablet 40 mg, 40 mg, Oral, Daily, Algie Coffer, Ajay, MD, 40 mg at 11/04/22 0809   potassium chloride (KLOR-CON M) CR tablet 10 mEq, 10 mEq, Oral, TID, Orpah Cobb, MD, 10 mEq at 11/04/22 0809   rosuvastatin (CRESTOR) tablet 40 mg, 40 mg, Oral, Daily, Orpah Cobb, MD, 40 mg at 11/04/22 0809   sucralfate (CARAFATE) 1 GM/10ML suspension 1 g, 1 g, Oral, BID WC, Orpah Cobb, MD, 1 g at 11/04/22 [phone]  valproic acid (DEPAKENE) 250 MG capsule 250 mg, 250 mg, Oral, BID, Orpah Cobb, MD, 250 mg at 11/04/22 0810  Exam: Current vital signs: BP 114/89 (BP Location: Left Arm)   Pulse 100   Temp 97.8 F (36.6 C) (Oral)   Resp 19   Ht 5\' 5"  (1.651 m)   Wt 66.8 kg   SpO2 94%   BMI 24.51 kg/m  Vital signs in last 24 hours: Temp:  [97.8 F (36.6 C)-98.4 F (36.9 C)] 97.8 F (36.6 C) (05/03 1132) Pulse Rate:  [84-100] 100 (05/03 1132) Resp:  [16-20] 19 (05/03 1132) BP: (93-114)/(59-89) 114/89 (05/03 1132) SpO2:  [94 %-99 %] 94 % (05/03 1132) Weight:  [66.8 kg] 66.8 kg (05/03 0706)  General: Awake alert in no distress HEENT: Normocephalic atraumatic Lungs: Clear Cardiovascular: Regular rhythm, 1+ pitting edema Abdomen nondistended nontender Neurological exam Awake alert oriented x 3 No dysarthria No aphasia Tangential speech Registration for 3 next, 3/3, recall for 3 objects 1/3. Exhibit delusions and acknowledges visual and auditory hallucinations.  Has disorganized thought process. Cranial nerves II to XII intact Motor examination with no drift in any of the 4 extremities.  Normal tone. Sensation intact to light touch Coordination examination with no dysmetria.    Labs I have reviewed labs in epic and the results pertinent to this consultation are: CBC    Component Value Date/Time   WBC 7.8 11/04/2022 0809   RBC 2.95 (L) 11/04/2022 0809   HGB 9.5 (L) 11/04/2022 0809   HCT 28.6 (L) 11/04/2022 0809   PLT 228 11/04/2022 0809   MCV 96.9 11/04/2022 0809   MCH 32.2 11/04/2022 0809   MCHC 33.2 11/04/2022 0809   RDW 16.6 (H) 11/04/2022 0809   LYMPHSABS 1.9 11/04/2022 0809   MONOABS 1.1 (H) 11/04/2022 0809   EOSABS 0.0 11/04/2022 0809   BASOSABS 0.0 11/04/2022 0809  CMP     Component Value Date/Time   NA 132 (L) 11/04/2022 0809   NA 137 03/23/2020 1433   K 3.2 (L) 11/04/2022 0809   CL 98 11/04/2022 0809   CO2 24 11/04/2022 0809   GLUCOSE 175 (H) 11/04/2022 0809   BUN 25 (H) 11/04/2022 0809   BUN 25 03/23/2020 1433   CREATININE 1.51 (H) 11/04/2022 0809   CREATININE 1.20 (H) 11/02/2022 1524   CALCIUM 8.2 (L) 11/04/2022 0809   PROT 6.5 11/04/2022 0809   ALBUMIN 2.6 (L) 11/04/2022 0809   AST 21 11/04/2022 0809   ALT 25 11/04/2022 0809   ALKPHOS 86 11/04/2022 0809   BILITOT 0.2 (L) 11/04/2022 0809   GFRNONAA 37 (L) 11/04/2022 0809   GFRNONAA 47 (L) 10/01/2020 1147   GFRAA 54 (L) 10/01/2020 1147  Lipid Panel     Component Value Date/Time   CHOL 121 11/03/2022 0546   CHOL 148 03/12/2020 1529   TRIG 43 11/03/2022 0546   HDL 43 11/03/2022 0546   HDL 48 03/12/2020 1529   CHOLHDL 2.8 11/03/2022 0546   VLDL 9 11/03/2022 0546   LDLCALC 69 11/03/2022 0546   LDLCALC 79 04/07/2022 1200   LDLDIRECT 105 (H) 08/14/2012 1415  B12 in 2023 was 319. TSH 1.02 during this admission  Imaging I have reviewed the images obtained: CT-head-05/07/2021-no acute changes. Last MRI brain report in the system from 12/08/1998. No new MRI.  Assessment: 72 year old woman past history of diabetes hypertension hyperlipidemia CKD 4 CHF COPD schizophrenia possible cognitive deficits over time admitted for shortness of breath and progressive leg edema with CHF  exacerbation being  treated by cardiology.  Neurology consulted for concerns for worsening memory and dementia that the family thinks she might be having at this time. Formal dementia evaluation will only be done outpatient Her brief examination is more consistent with disorganized thoughts and delusions along with poor short-term memory based on small bowel type testing. At this time, I do not know if her dementia is related to her underlying psychiatric illness or is unrelated. I do not think on my examination that she has any big stroke that I can localize to anywhere in the brain of the brainstem.  That said she needs further workup with an EEG and MRI to ensure that there is no structural or electrographic abnormalities as a part of cognitive deficit and memory deficit workup. She has historically had B12 that is in the low normal range but I would consider that low from a neurological standpoint.  Will recheck reversible causes of dementia  Impression: Possible memory decline and dementia in the setting of psychiatric illness  Recommendations: MRI brain without contrast Routine EEG Check B12, RPR A1c was 8.3 in January.  Will recheck.  Goal A1c less than 7. Outpatient amatory referral to neurology has been placed for formal neuropsychological testing Appreciate psychiatry input Medical management per primary team as you are I will follow-up the above testing with you.  -- Milon Dikes, MD Neurologist [facility] Pager: [phone]

## 2022-11-04 NOTE — Progress Notes (Signed)
Pt has PRN bipap orders, pt comfortable and no distress noted. RT will cont to monitor as needed.

## 2022-11-04 NOTE — Progress Notes (Signed)
Patient refuses to wear her telemetry monitor, she states she would rather go home and die even though we are providing "good care". She's demanding to speak her physician and refusing medications/procedures/labs except those she requests and says that people are in her room with electric tasers and have medications to poison her in her purse. She's demanding that we call the physician and find her purse. Physician has been notified.

## 2022-11-04 NOTE — Progress Notes (Signed)
IV Team to bedside per consult. Patient refused IV access at this time.

## 2022-11-04 NOTE — Progress Notes (Signed)
Pt refusing KUB because she "doesn't want a man taking pictures of her and selling them."  Please note she prefers female staff, including transporters.  Will reattempt KUB in am.  Alonza Bogus

## 2022-11-04 NOTE — Plan of Care (Signed)
  Aixa Seyfried (daughter) @ [phone] Wanted patient to do a neurology work up, because family is concern for dementia.  [facility] admission first week of April and Dc'd 25th June 2024 for delusions, aggressive behavior and hallucinations.  Stated that patient at baseline patient is no delusions or hallucinations. Last time at "baseline" was ~2016. Millie and Darel Hong were her neighbors,  Traumatic event where patient's husband died around 12/09/2018.   At home she is combative and paranoid delusion at home. About   Dx: Schizoaffective (dx at in 12-08-2020 at [facility]) Guardianship: Cassandra Inpatient psych: ~8-9 Suicide: no Homicide: no Rx: depakote, zyprexa, risperdal, invega, lorazepam  Oct 2023 was the last time patient received LAI at [facility] Non-adherent to medication Psychiatrist:   Family Dad with dementia  Medical No h/o   Social Housing: Living with daughter, grandchild Guns/weapons: Denied Nicotine: cigarettes - 1/2 PPD on and off for years EtOH: None currently Illicit substances: Denied

## 2022-11-05 ENCOUNTER — Inpatient Hospital Stay (HOSPITAL_COMMUNITY): Payer: Medicare Other

## 2022-11-05 DIAGNOSIS — R569 Unspecified convulsions: Secondary | ICD-10-CM | POA: Diagnosis not present

## 2022-11-05 DIAGNOSIS — R4182 Altered mental status, unspecified: Secondary | ICD-10-CM | POA: Diagnosis not present

## 2022-11-05 DIAGNOSIS — F250 Schizoaffective disorder, bipolar type: Secondary | ICD-10-CM | POA: Diagnosis not present

## 2022-11-05 LAB — GLUCOSE, CAPILLARY
Glucose-Capillary: 118 mg/dL — ABNORMAL HIGH (ref 70–99)
Glucose-Capillary: 121 mg/dL — ABNORMAL HIGH (ref 70–99)
Glucose-Capillary: 295 mg/dL — ABNORMAL HIGH (ref 70–99)

## 2022-11-05 MED ORDER — ALUM & MAG HYDROXIDE-SIMETH 200-200-20 MG/5ML PO SUSP: 30.0000 mL | Freq: Four times a day (QID) | ORAL | Status: DC | PRN

## 2022-11-05 MED ORDER — METOPROLOL TARTRATE 12.5 MG HALF TABLET
12.5000 mg | ORAL_TABLET | Freq: Two times a day (BID) | ORAL | Status: DC
  Administered 2022-11-05 – 2022-11-07 (×5): 12.5 mg via ORAL
  Filled 2022-11-05 (×5): qty 1

## 2022-11-05 NOTE — Progress Notes (Signed)
Subjective:  Patient denies any chest pain or shortness of breath refusing for IV heparin as patient remains in atrial fibrillation Refused Lovenox also yesterday, but may think of proceeding with cardiac catheterization, but has some business to take care of at home before consenting.  Neurology workup is in progress Objective:  Vital Signs in the last 24 hours: Temp:  [97.8 F (36.6 C)-98.9 F (37.2 C)] 98.3 F (36.8 C) (05/04 0900) Pulse Rate:  [100-106] 100 (05/04 0900) Resp:  [18-21] 19 (05/04 0900) BP: (114-130)/(78-89) 130/79 (05/04 0900) SpO2:  [93 %-98 %] 98 % (05/04 0900) Weight:  [66.3 kg] 66.3 kg (05/04 0446)  Intake/Output from previous day: 05/03 0701 - 05/04 0700 In: 240 [P.O.:240] Out: -  Intake/Output from this shift: No intake/output data recorded.  Physical Exam: Neck: no adenopathy, no carotid bruit, no JVD, and supple, symmetrical, trachea midline Lungs: Decreased breath sounds at bases Heart: irregularly irregular rhythm, S1, S2 normal, and 2/6 systolic murmur noted Abdomen: soft, non-tender; bowel sounds normal; no masses,  no organomegaly Extremities: extremities normal, atraumatic, no cyanosis or edema  Lab Results: Recent Labs    11/03/22 0546 11/04/22 0809  WBC 10.0 7.8  HGB 10.5* 9.5*  PLT 220 228   Recent Labs    11/03/22 0546 11/04/22 0809  NA 134* 132*  K 3.7 3.2*  CL 103 98  CO2 20* 24  GLUCOSE 243* 175*  BUN 15 25*  CREATININE 1.37* 1.51*   No results for input(s): "TROPONINI" in the last 72 hours.  Invalid input(s): "CK", "MB" Hepatic Function Panel Recent Labs    11/04/22 0809  PROT 6.5  ALBUMIN 2.6*  AST 21  ALT 25  ALKPHOS 86  BILITOT 0.2*   Recent Labs    11/03/22 0546  CHOL 121   No results for input(s): "PROTIME" in the last 72 hours.  Imaging: Imaging results have been reviewed and DG Abd 1 View  Result Date: 11/05/2022 CLINICAL DATA:  Acute coronary syndrome EXAM: ABDOMEN - 1 VIEW COMPARISON:  CT  abdomen and pelvis 05/14/2020 FINDINGS: S-shaped scoliosis in the thoracolumbar spine. Mild elevation of the right hemidiaphragm. Bowel gas in the abdomen and pelvis with a nonobstructive bowel gas pattern. Round densities along the left side of the pelvis are most likely associated with bowel. Sclerosis and degenerative changes at the symphysis pubis. IMPRESSION: 1. Nonobstructive bowel gas pattern. 2. S-shaped scoliosis in the thoracolumbar spine. Electronically Signed   By: Richarda Overlie M.D.   On: 11/05/2022 09:23   EEG adult  Result Date: 11/05/2022 Charlsie Quest, MD     11/05/2022  9:19 AM Patient Name: [name] Long MRN: [id number] Epilepsy Attending: Charlsie Quest Referring Physician/Provider: Milon Dikes, MD Date: 11/05/2022 Duration: 22.17 mins Patient history: 72yo F with worsening memory and dementia getting eeg to evaluate for seizure Level of alertness: Awake AEDs during EEG study: Ativan Technical aspects: This EEG study was done with scalp electrodes positioned according to the 10-20 International system of electrode placement. Electrical activity was reviewed with band pass filter of 1-70Hz , sensitivity of 7 uV/mm, display speed of 55mm/sec with a 60Hz  notched filter applied as appropriate. EEG data were recorded continuously and digitally stored.  Video monitoring was available and reviewed as appropriate. Description: The posterior dominant rhythm consists of 8 Hz activity of moderate voltage (25-35 uV) seen predominantly in posterior head regions, symmetric and reactive to eye opening and eye closing. Hyperventilation and photic stimulation were not performed.   IMPRESSION: This  study is within normal limits. No seizures or epileptiform discharges were seen throughout the recording. A normal interictal EEG does not exclude the diagnosis of epilepsy. Priyanka Annabelle Harman   CT HEAD WO CONTRAST ( )  Result Date: 11/04/2022 CLINICAL DATA:  Altered mental status EXAM: CT HEAD WITHOUT CONTRAST  TECHNIQUE: Contiguous axial images were obtained from the base of the skull through the vertex without intravenous contrast. RADIATION DOSE REDUCTION: This exam was performed according to the departmental dose-optimization program which includes automated exposure control, adjustment of the mA and/or kV according to patient size and/or use of iterative reconstruction technique. COMPARISON:  CT Head 05/07/21 FINDINGS: Brain: No evidence of acute infarction, hemorrhage, hydrocephalus, extra-axial collection or mass lesion/mass effect. Punctate calcification in the posterior left temporal lobe is unchanged. Vascular: No hyperdense vessel or unexpected calcification. Skull: Normal. Negative for fracture or focal lesion. Sinuses/Orbits: No middle ear or mastoid effusion. Paranasal sinuses are clear. Orbits are unremarkable. Other: None. IMPRESSION: No acute intracranial abnormality. Electronically Signed   By: Lorenza Cambridge M.D.   On: 11/04/2022 18:55   ECHOCARDIOGRAM COMPLETE  Result Date: 11/03/2022    ECHOCARDIOGRAM REPORT   Patient Name:   [name] Long Date of Exam: 11/03/2022 Medical Rec #:  [id number]        Height:       65.0 in Accession #:    [id number]       Weight:       145.5 lb Date of Birth:  [birth year]        BSA:          1.728 m Patient Age:    72 years         BP:           114/55 mmHg Patient Gender: F                HR:           88 bpm. Exam Location:  Inpatient Procedure: 2D Echo, Cardiac Doppler and Color Doppler Indications:     Acute ischemic heart disease, unspecified I24.9, CHF I50.9  History:         Patient has prior history of Echocardiogram examinations, most                  recent 03/08/2022. CHF, Previous Myocardial Infarction, COPD,                  Arrythmias:Atrial Fibrillation and Atrial Flutter; Risk                  Factors:Hypertension. CKD, stage 3.  Sonographer:     Lucendia Herrlich Referring Phys:  [id number] Orpah Cobb Diagnosing Phys: Orpah Cobb MD  Sonographer Comments: Image  acquisition challenging due to uncooperative patient and Image acquisition challenging due to patient behavioral factors. IMPRESSIONS  1. Left ventricular ejection fraction, by estimation, is 50 to 55%. The left ventricle has low normal function. The left ventricle has no regional wall motion abnormalities. Left ventricular diastolic function could not be evaluated.  2. Right ventricular systolic function is normal. The right ventricular size is normal.  3. Left atrial size was moderately dilated.  4. Right atrial size was mildly dilated.  5. There is no evidence of cardiac tamponade.  6. The mitral valve is grossly normal. Moderate mitral valve regurgitation.  7. The aortic valve is tricuspid. Aortic valve regurgitation is not visualized. Aortic valve sclerosis is present, with no evidence of aortic valve stenosis.  8.  There is mild (Grade II) atheroma plaque involving the aortic root and ascending aorta. FINDINGS  Left Ventricle: Left ventricular ejection fraction, by estimation, is 50 to 55%. The left ventricle has low normal function. The left ventricle has no regional wall motion abnormalities. The left ventricular internal cavity size was normal in size. There is no left ventricular hypertrophy. Left ventricular diastolic function could not be evaluated. Right Ventricle: The right ventricular size is normal. No increase in right ventricular wall thickness. Right ventricular systolic function is normal. Left Atrium: Left atrial size was moderately dilated. Right Atrium: Right atrial size was mildly dilated. Pericardium: Trivial pericardial effusion is present. The pericardial effusion is circumferential. There is no evidence of cardiac tamponade. Mitral Valve: The mitral valve is grossly normal. Moderate mitral valve regurgitation. Tricuspid Valve: The tricuspid valve is normal in structure. Tricuspid valve regurgitation is mild. Aortic Valve: Cannot exclude vegetation. The aortic valve is tricuspid. Aortic  valve regurgitation is not visualized. Aortic valve sclerosis is present, with no evidence of aortic valve stenosis. Pulmonic Valve: The pulmonic valve was normal in structure. Pulmonic valve regurgitation is trivial. Aorta: The aortic root is normal in size and structure. There is mild (Grade II) atheroma plaque involving the aortic root and ascending aorta. Venous: The inferior vena cava was not well visualized. IAS/Shunts: The atrial septum is grossly normal.  LEFT VENTRICLE PLAX 2D LVIDd:         4.30 cm LVIDs:         3.00 cm LV PW:         1.00 cm LV IVS:        0.90 cm LVOT diam:     2.00 cm LVOT Area:     3.14 cm  LEFT ATRIUM         Index LA diam:    4.60 cm 2.66 cm/m   AORTA Ao Root diam: 2.70 cm Ao Asc diam:  3.20 cm TRICUSPID VALVE TR Peak grad:   12.5 mmHg TR Vmax:        177.00 cm/s  SHUNTS Systemic Diam: 2.00 cm Orpah Cobb MD Electronically signed by Orpah Cobb MD Signature Date/Time: 11/03/2022/12:41:43 PM    Final     Cardiac Studies:  Assessment/Plan:  NSTEMI Acute systolic and diastolic left heart failure, HFpEF Chronic atrial fibrillation with moderate ventricular response CHA2DS2-VASc score of 5  Moderate MR Mild TR Type 2 DM HTN HLD CKD, IV Hypothyroidism Schizophrenia with manic episode Possible memory decline and dementia in setting of psych issues COPD Acute on chronic anemia rule out GI loss Hypokalemia Plan Add low-dose beta-blockers as per orders Replace K Check stool for occult blood Patient refusing further evaluations by GI and also IV heparin Check labs in a.m.  LOS: 2 days    Rinaldo Cloud 11/05/2022, 10:52 AM

## 2022-11-05 NOTE — Progress Notes (Addendum)
Neurology Progress Note   S:// Seeen and examined this AM Insistent to not stay in the hospital She has a legal guardian-I am not sure if she is able to sign herself out-unclear of her voluntary status   O:// Current vital signs: BP 130/79 (BP Location: Left Arm)   Pulse 95   Temp 98.3 F (36.8 C) (Oral)   Resp 19   Ht 5\' 5"  (1.651 m)   Wt 66.3 kg   SpO2 98%   BMI 24.31 kg/m  Vital signs in last 24 hours: Temp:  [97.9 F (36.6 C)-98.9 F (37.2 C)] 98.3 F (36.8 C) (05/04 0900) Pulse Rate:  [95-106] 95 (05/04 1200) Resp:  [18-21] 19 (05/04 0900) BP: (114-130)/(78-79) 130/79 (05/04 0900) SpO2:  [93 %-98 %] 98 % (05/04 0900) Weight:  [66.3 kg] 66.3 kg (05/04 0446) Awake alert in no distress Insisting to go home Speech is clear but thought process is disorganized and has a lot of tangentiality in her discussions Exhibiting and acknowledging delusions at this time Cranial nerves II to XII intact No motor deficits No sensory deficits No dysmetria Not extremely cooperative with exam-hence exam with the limitations of her cooperation  Medications  Current Facility-Administered Medications:    acetaminophen (TYLENOL) tablet 650 mg, 650 mg, Oral, Q4H PRN, Orpah Cobb, MD, 650 mg at 11/04/22 2138   albuterol (PROVENTIL) (2.5 MG/3ML) 0.083% nebulizer solution 3 mL, 3 mL, Inhalation, QID PRN, Orpah Cobb, MD, 3 mL at 11/05/22 0402   alum & mag hydroxide-simeth (MAALOX/MYLANTA) 200-200-20 MG/5ML suspension 30 mL, 30 mL, Oral, Q6H PRN, Orpah Cobb, MD   amLODipine (NORVASC) tablet 2.5 mg, 2.5 mg, Oral, Daily, Orpah Cobb, MD, 2.5 mg at 11/05/22 0934   aspirin EC tablet 81 mg, 81 mg, Oral, Daily, Orpah Cobb, MD, 81 mg at 11/05/22 0934   dapagliflozin propanediol (FARXIGA) tablet 5 mg, 5 mg, Oral, QAC breakfast, Orpah Cobb, MD, 5 mg at 11/05/22 0934   enoxaparin (LOVENOX) injection 40 mg, 40 mg, Subcutaneous, Q24H, Algie Coffer, Ajay, MD, 40 mg at 11/05/22 0932   insulin  aspart (novoLOG) injection 0-15 Units, 0-15 Units, Subcutaneous, TID WC, Orpah Cobb, MD, 3 Units at 11/05/22 0900   insulin aspart (novoLOG) injection 4 Units, 4 Units, Subcutaneous, TID WC, Orpah Cobb, MD, 4 Units at 11/05/22 4098   levothyroxine (SYNTHROID) tablet 50 mcg, 50 mcg, Oral, QAC breakfast, Orpah Cobb, MD, 50 mcg at 11/05/22 0615   linagliptin (TRADJENTA) tablet 5 mg, 5 mg, Oral, Daily, Orpah Cobb, MD, 5 mg at 11/05/22 0934   LORazepam (ATIVAN) injection 1 mg, 1 mg, Intravenous, Once, Orpah Cobb, MD   LORazepam (ATIVAN) tablet 0.5 mg, 0.5 mg, Oral, TID, Orpah Cobb, MD, 0.5 mg at 11/04/22 2039   metoprolol tartrate (LOPRESSOR) tablet 12.5 mg, 12.5 mg, Oral, BID, Rinaldo Cloud, MD   nitroGLYCERIN (NITROSTAT) SL tablet 0.4 mg, 0.4 mg, Sublingual, Q5 Min x 3 PRN, Orpah Cobb, MD   ondansetron (ZOFRAN) injection 4 mg, 4 mg, Intravenous, Q6H PRN, Orpah Cobb, MD   pantoprazole (PROTONIX) EC tablet 40 mg, 40 mg, Oral, Daily, Algie Coffer, Ajay, MD, 40 mg at 11/05/22 0934   potassium chloride (KLOR-CON M) CR tablet 10 mEq, 10 mEq, Oral, TID, Orpah Cobb, MD, 10 mEq at 11/05/22 0934   rosuvastatin (CRESTOR) tablet 40 mg, 40 mg, Oral, Daily, Orpah Cobb, MD, 40 mg at 11/05/22 0934   sucralfate (CARAFATE) 1 GM/10ML suspension 1 g, 1 g, Oral, BID WC, Orpah Cobb, MD, 1 g at 11/05/22 [phone]  valproic acid (DEPAKENE) 250 MG capsule 250 mg, 250 mg, Oral, BID, Orpah Cobb, MD, 250 mg at 11/05/22 0935 Labs CBC    Component Value Date/Time   WBC 7.8 11/04/2022 0809   RBC 2.95 (L) 11/04/2022 0809   HGB 9.5 (L) 11/04/2022 0809   HCT 28.6 (L) 11/04/2022 0809   PLT 228 11/04/2022 0809   MCV 96.9 11/04/2022 0809   MCH 32.2 11/04/2022 0809   MCHC 33.2 11/04/2022 0809   RDW 16.6 (H) 11/04/2022 0809   LYMPHSABS 1.9 11/04/2022 0809   MONOABS 1.1 (H) 11/04/2022 0809   EOSABS 0.0 11/04/2022 0809   BASOSABS 0.0 11/04/2022 0809    CMP     Component Value Date/Time   NA 132  (L) 11/04/2022 0809   NA 137 03/23/2020 1433   K 3.2 (L) 11/04/2022 0809   CL 98 11/04/2022 0809   CO2 24 11/04/2022 0809   GLUCOSE 175 (H) 11/04/2022 0809   BUN 25 (H) 11/04/2022 0809   BUN 25 03/23/2020 1433   CREATININE 1.51 (H) 11/04/2022 0809   CREATININE 1.20 (H) 11/02/2022 1524   CALCIUM 8.2 (L) 11/04/2022 0809   PROT 6.5 11/04/2022 0809   ALBUMIN 2.6 (L) 11/04/2022 0809   AST 21 11/04/2022 0809   ALT 25 11/04/2022 0809   ALKPHOS 86 11/04/2022 0809   BILITOT 0.2 (L) 11/04/2022 0809   GFRNONAA 37 (L) 11/04/2022 0809   GFRNONAA 47 (L) 10/01/2020 1147   GFRAA 54 (L) 10/01/2020 1147     Lipid Panel     Component Value Date/Time   CHOL 121 11/03/2022 0546   CHOL 148 03/12/2020 1529   TRIG 43 11/03/2022 0546   HDL 43 11/03/2022 0546   HDL 48 03/12/2020 1529   CHOLHDL 2.8 11/03/2022 0546   VLDL 9 11/03/2022 0546   LDLCALC 69 11/03/2022 0546   LDLCALC 79 04/07/2022 1200   LDLDIRECT 105 (H) 08/14/2012 1415     Imaging I have reviewed images in epic and the results pertinent to this consultation are: CT head with no acute changes  Neurodiagnostics: EEG: Normal.  Assessment: 72 year old past history of diabetes hypertension hyperlipidemia CHF COPD CKD 4 schizophrenia admitted for CHF exacerbation, getting cardiac workup.  Neurology consulted for worsening memory and possible dementia. Formal dementia evaluation has to be done outpatient. She does exhibit symptoms of memory loss on bedside testing but will need full workup outpatient. Her CT scan looks okay. I do not see any focal deficits to insist on an MRI at this time, especially given her noncooperation. Her EEG is unremarkable   Impression: Possible dementia and acute decline in the setting of acute psychiatric illness and acute medical illness  Recommendations: No further inpatient neurological workup Keep B12 levels over 400 Follow-up with outpatient neurology for neuropsychological testing Appreciate  psychiatry input. Plan was relayed to Dr. Sharyn Lull via secure chat.  He was also paged-I have not received a call back as of 5:06 PM I have left a secure chat message with my cell phone number and there in case there are any questions. Neurology will sign off for now.   -- Milon Dikes, MD Neurologist [facility] Pager: [phone]

## 2022-11-05 NOTE — Plan of Care (Signed)

## 2022-11-05 NOTE — Plan of Care (Signed)

## 2022-11-05 NOTE — Progress Notes (Addendum)
Patient has been agitated and wanting to leave throughout this shift. Staff has attempted to redirect and calm patient throughout the shift. Patient has removed heart monitor and her peripheral IV and does not want them replaced. Patient has been going back and forth about MRI, intermittently declining, then agreeing, then declining again; however, Dr. Bess Harvest note is stating that there is no indication to insist on MRI, so it was cancelled. Patient has a legal guardian, Elonda Husky (daughter), who is aware of the above. RN attempting to notify Dr. Sharyn Lull via pager and via secure chat. Will attempt to notify via the office answering service.  Update 1535: Dr. Sharyn Lull called back and was notified of the above information by Ty Hilts, RN.

## 2022-11-05 NOTE — Progress Notes (Signed)
EEG complete - results pending 

## 2022-11-05 NOTE — Procedures (Signed)
Patient Name: [name] Long  MRN: [id number]  Epilepsy Attending: Charlsie Quest  Referring Physician/Provider: Milon Dikes, MD  Date: 11/05/2022 Duration: 22.17 mins  Patient history: 72yo F with worsening memory and dementia getting eeg to evaluate for seizure  Level of alertness: Awake  AEDs during EEG study: Ativan  Technical aspects: This EEG study was done with scalp electrodes positioned according to the 10-20 International system of electrode placement. Electrical activity was reviewed with band pass filter of 1-70Hz , sensitivity of 7 uV/mm, display speed of 4mm/sec with a 60Hz  notched filter applied as appropriate. EEG data were recorded continuously and digitally stored.  Video monitoring was available and reviewed as appropriate.  Description: The posterior dominant rhythm consists of 8 Hz activity of moderate voltage (25-35 uV) seen predominantly in posterior head regions, symmetric and reactive to eye opening and eye closing. Hyperventilation and photic stimulation were not performed.     IMPRESSION: This study is within normal limits. No seizures or epileptiform discharges were seen throughout the recording.  A normal interictal EEG does not exclude the diagnosis of epilepsy.  [name] Annabelle Harman

## 2022-11-06 LAB — GLUCOSE, CAPILLARY
Glucose-Capillary: 169 mg/dL — ABNORMAL HIGH (ref 70–99)
Glucose-Capillary: 177 mg/dL — ABNORMAL HIGH (ref 70–99)
Glucose-Capillary: 144 mg/dL — ABNORMAL HIGH (ref 70–99)

## 2022-11-06 MED ORDER — ORAL CARE MOUTH RINSE: 15.0000 mL | OROMUCOSAL | Status: DC | PRN

## 2022-11-06 MED ORDER — TORSEMIDE 20 MG PO TABS
40.0000 mg | ORAL_TABLET | Freq: Once | ORAL | Status: AC
  Administered 2022-11-06: 40 mg via ORAL
  Filled 2022-11-06: qty 2

## 2022-11-06 MED ORDER — MENTHOL 3 MG MT LOZG
1.0000 | LOZENGE | OROMUCOSAL | Status: DC | PRN
  Filled 2022-11-06: qty 9

## 2022-11-06 MED ORDER — FUROSEMIDE 10 MG/ML IJ SOLN: 80.0000 mg | Freq: Once | INTRAMUSCULAR | Status: DC

## 2022-11-06 NOTE — Progress Notes (Signed)
Patient in no respiratory distress. BiPAP not needed at this time.  

## 2022-11-06 NOTE — Progress Notes (Signed)
Subjective:  Appreciate all consultants help Denies any complaints patient refusing to take medications and lab draws spoke with her daughter yesterday agrees for cardiac catheterization but wanted to discuss with [name]  Objective:  Vital Signs in the last 24 hours: Temp:  [98 F (36.7 C)-98.7 F (37.1 C)] 98.6 F (37 C) (05/05 0745) Pulse Rate:  [93-95] 93 (05/05 0745) Resp:  [18-28] 20 (05/05 0745) BP: (111-132)/(59-93) 111/65 (05/05 0745) SpO2:  [94 %-99 %] 96 % (05/05 0745) Weight:  [65.8 kg] 65.8 kg (05/05 0347)  Intake/Output from previous day: 05/04 0701 - 05/05 0700 In: 240 [P.O.:240] Out: -  Intake/Output from this shift: No intake/output data recorded.  Physical Exam: Exam unchanged  Lab Results: Recent Labs    11/04/22 0809  WBC 7.8  HGB 9.5*  PLT 228   Recent Labs    11/04/22 0809  NA 132*  K 3.2*  CL 98  CO2 24  GLUCOSE 175*  BUN 25*  CREATININE 1.51*   No results for input(s): "TROPONINI" in the last 72 hours.  Invalid input(s): "CK", "MB" Hepatic Function Panel Recent Labs    11/04/22 0809  PROT 6.5  ALBUMIN 2.6*  AST 21  ALT 25  ALKPHOS 86  BILITOT 0.2*   No results for input(s): "CHOL" in the last 72 hours. No results for input(s): "PROTIME" in the last 72 hours.  Imaging: Imaging results have been reviewed and DG Abd 1 View  Result Date: 11/05/2022 CLINICAL DATA:  Acute coronary syndrome EXAM: ABDOMEN - 1 VIEW COMPARISON:  CT abdomen and pelvis 05/14/2020 FINDINGS: S-shaped scoliosis in the thoracolumbar spine. Mild elevation of the right hemidiaphragm. Bowel gas in the abdomen and pelvis with a nonobstructive bowel gas pattern. Round densities along the left side of the pelvis are most likely associated with bowel. Sclerosis and degenerative changes at the symphysis pubis. IMPRESSION: 1. Nonobstructive bowel gas pattern. 2. S-shaped scoliosis in the thoracolumbar spine. Electronically Signed   By: Richarda Overlie M.D.   On:  11/05/2022 09:23   EEG adult  Result Date: 11/05/2022 Charlsie Quest, MD     11/05/2022  9:19 AM Patient Name: NOELY NORMANDIN MRN: [id number] Epilepsy Attending: Charlsie Quest Referring Physician/Provider: Milon Dikes, MD Date: 11/05/2022 Duration: 22.17 mins Patient history: 72yo F with worsening memory and dementia getting eeg to evaluate for seizure Level of alertness: Awake AEDs during EEG study: Ativan Technical aspects: This EEG study was done with scalp electrodes positioned according to the 10-20 International system of electrode placement. Electrical activity was reviewed with band pass filter of 1-70Hz , sensitivity of 7 uV/mm, display speed of 10mm/sec with a 60Hz  notched filter applied as appropriate. EEG data were recorded continuously and digitally stored.  Video monitoring was available and reviewed as appropriate. Description: The posterior dominant rhythm consists of 8 Hz activity of moderate voltage (25-35 uV) seen predominantly in posterior head regions, symmetric and reactive to eye opening and eye closing. Hyperventilation and photic stimulation were not performed.   IMPRESSION: This study is within normal limits. No seizures or epileptiform discharges were seen throughout the recording. A normal interictal EEG does not exclude the diagnosis of epilepsy. Priyanka Annabelle Harman   CT HEAD WO CONTRAST ( )  Result Date: 11/04/2022 CLINICAL DATA:  Altered mental status EXAM: CT HEAD WITHOUT CONTRAST TECHNIQUE: Contiguous axial images were obtained from the base of the skull through the vertex without intravenous contrast. RADIATION DOSE REDUCTION: This exam was performed according to the departmental dose-optimization  program which includes automated exposure control, adjustment of the mA and/or kV according to patient size and/or use of iterative reconstruction technique. COMPARISON:  CT Head 05/07/21 FINDINGS: Brain: No evidence of acute infarction, hemorrhage, hydrocephalus, extra-axial  collection or mass lesion/mass effect. Punctate calcification in the posterior left temporal lobe is unchanged. Vascular: No hyperdense vessel or unexpected calcification. Skull: Normal. Negative for fracture or focal lesion. Sinuses/Orbits: No middle ear or mastoid effusion. Paranasal sinuses are clear. Orbits are unremarkable. Other: None. IMPRESSION: No acute intracranial abnormality. Electronically Signed   By: Lorenza Cambridge M.D.   On: 11/04/2022 18:55    Cardiac Studies:  Assessment/Plan:  NSTEMI Acute systolic and diastolic left heart failure, HFpEF Chronic atrial fibrillation with moderate ventricular response CHA2DS2-VASc score of 5  Moderate MR Mild TR Type 2 DM HTN HLD CKD, IV Hypothyroidism Schizophrenia with manic episode Possible memory decline and dementia in setting of psych issues COPD Acute on chronic anemia rule out GI loss Hypokalemia Plan Continue present management Encouraged to take medications and also obtain the labs [name] to follow from a.m.   LOS: 3 days    Rinaldo Cloud 11/06/2022, 9:51 AM

## 2022-11-07 LAB — CBC
HCT: 34.1 % — ABNORMAL LOW (ref 36.0–46.0)
Hemoglobin: 11.4 g/dL — ABNORMAL LOW (ref 12.0–15.0)
MCH: 32.3 pg (ref 26.0–34.0)
MCHC: 33.4 g/dL (ref 30.0–36.0)
MCV: 96.6 fL (ref 80.0–100.0)
Platelets: 297 10*3/uL (ref 150–400)
RBC: 3.53 MIL/uL — ABNORMAL LOW (ref 3.87–5.11)
RDW: 16.5 % — ABNORMAL HIGH (ref 11.5–15.5)
WBC: 7.9 10*3/uL (ref 4.0–10.5)
nRBC: 0 % (ref 0.0–0.2)

## 2022-11-07 LAB — BASIC METABOLIC PANEL
Anion gap: 8 (ref 5–15)
BUN: 18 mg/dL (ref 8–23)
CO2: 33 mmol/L — ABNORMAL HIGH (ref 22–32)
Calcium: 8.9 mg/dL (ref 8.9–10.3)
Chloride: 96 mmol/L — ABNORMAL LOW (ref 98–111)
Creatinine, Ser: 1.26 mg/dL — ABNORMAL HIGH (ref 0.44–1.00)
GFR, Estimated: 46 mL/min — ABNORMAL LOW (ref >60)
Glucose, Bld: 168 mg/dL — ABNORMAL HIGH (ref 70–99)
Potassium: 4.1 mmol/L (ref 3.5–5.1)
Sodium: 137 mmol/L (ref 135–145)

## 2022-11-07 LAB — GLUCOSE, CAPILLARY
Glucose-Capillary: 188 mg/dL — ABNORMAL HIGH (ref 70–99)
Glucose-Capillary: 223 mg/dL — ABNORMAL HIGH (ref 70–99)
Glucose-Capillary: 94 mg/dL (ref 70–99)
Glucose-Capillary: 222 mg/dL — ABNORMAL HIGH (ref 70–99)

## 2022-11-07 SURGERY — LEFT HEART CATH AND CORONARY ANGIOGRAPHY
Anesthesia: LOCAL

## 2022-11-07 MED ORDER — SODIUM CHLORIDE 0.9 % IV SOLN: INTRAVENOUS | Status: DC

## 2022-11-07 MED ORDER — MENTHOL 3 MG MT LOZG: 1.0000 | LOZENGE | OROMUCOSAL | 12 refills | Status: DC | PRN

## 2022-11-07 MED ORDER — TORSEMIDE 20 MG PO TABS: 20.0000 mg | ORAL_TABLET | ORAL | 3 refills | Status: DC

## 2022-11-07 MED ORDER — DAPAGLIFLOZIN PROPANEDIOL 5 MG PO TABS: 5.0000 mg | ORAL_TABLET | Freq: Every day | ORAL | 6 refills | Status: DC

## 2022-11-07 MED ORDER — POTASSIUM CHLORIDE CRYS ER 10 MEQ PO TBCR: 10.0000 meq | EXTENDED_RELEASE_TABLET | Freq: Every day | ORAL | 6 refills | Status: DC

## 2022-11-07 MED ORDER — INSULIN ASPART 100 UNIT/ML IJ SOLN: 0.0000 [IU] | Freq: Three times a day (TID) | INTRAMUSCULAR | 11 refills | Status: DC

## 2022-11-07 MED ORDER — ASPIRIN 81 MG PO TBEC: 81.0000 mg | DELAYED_RELEASE_TABLET | Freq: Every day | ORAL | 12 refills | Status: DC

## 2022-11-07 MED ORDER — POTASSIUM CHLORIDE CRYS ER 10 MEQ PO TBCR: 10.0000 meq | EXTENDED_RELEASE_TABLET | Freq: Every day | ORAL | Status: DC

## 2022-11-07 MED ORDER — ACETAMINOPHEN 325 MG PO TABS: 650.0000 mg | ORAL_TABLET | ORAL | Status: DC | PRN

## 2022-11-07 MED ORDER — TORSEMIDE 20 MG PO TABS
20.0000 mg | ORAL_TABLET | ORAL | Status: DC
  Administered 2022-11-07: 20 mg via ORAL
  Filled 2022-11-07: qty 1

## 2022-11-07 NOTE — Care Management Important Message (Signed)
Important Message  Patient Details  Name: [name] Long MRN: [id number] Date of Birth: 11-Oct-1950   Medicare Important Message Given:  Yes     Renie Ora 11/07/2022, 11:13 AM

## 2022-11-07 NOTE — Progress Notes (Signed)
Pt was noted to have labored breathing & wheezing, this RN spoke with [name] on the phone and verbal order for one-time dose of Torsemide 40 mg was given and administered. PRN nebulizer treatment was also administered. Pt now resting comfortably, agreeable to heart catheterization on 5/6. Will continue to monitor.  Bari Edward, RN

## 2022-11-07 NOTE — Discharge Summary (Signed)
Physician Discharge Summary  Patient ID: [name] Long MRN: [id number] DOB/AGE: 08-Dec-1950 72 y.o.  Admit date: 11/02/2022 Discharge date: 11/07/2022  Admission Diagnoses: NSTEMI Acute systolic left heart failure Type 2 DM HTN HLD CKD, IV Hypothyroidism Schizophrenia with manic episode COPD  Discharge Diagnoses:  Principal Problem: NSTEMI   Acute coronary syndrome (HCC) Active problem:   Acute systolic and diastolic left heart failure, HFpEF   Chronic atrial fibrillation   Moderate MR   Mild TR   Type 2 DM with hyperglycemia   HTN   HLD   Hypothyroidism   CKD, IV   Schizophrenia with manic episodes   Dementia   COPD   Hypokalemia, improved  Discharged Condition: fair  Hospital Course: 72 years old black female with PMH of type 2 DM, HTN, HLD, hypothyroidism, Schizophrenia, COPD, GERD, CKD, IV had progressive leg edema and shortness of breath.  Her Chest x-ray showed cardiomegaly.  EKG showed atrial fibrillation with diffuse ischemia and old ASWMI. Troponin I was elevated suggestive of NSTEMI. BNP was elevated. Hypokalemia improved with potassium supplement. Echocardiogram showed preserved LV systolic function. She responded to controlled IV lasix use followed by oral torsemide. She needed psych consult and neurology consult. Cardiac cath was refused by the patient. She will see primary care in 1 week and see me in 1 month and psych and OP neurology as arranged.  Consults: cardiology, neurology, and psychiatry  Significant Diagnostic Studies: labs: Mild hypokalemia, elevated creatinine, Troponin I and BNP.  Chest x-ray: showed cardiomegaly.   EKG showed atrial fibrillation with diffuse ischemia and old ASWMI.  Echocardiogram showed preserved LV systolic function. Moderate MR and mild TR.  CT head: No acute abnormality.  Treatments: cardiac meds: Aspirin, Crestor, metoprolol, amlodipine, and Torsemide.  She resumed Valproic acid  Discharge Exam: Blood  pressure 123/75, pulse 69, temperature 99 F (37.2 C), temperature source Oral, resp. rate 19, height 5\' 5"  (1.651 m), weight 62.8 kg, SpO2 98 %. General appearance: alert, cooperative and appears stated age. Head: Normocephalic, atraumatic. Eyes: Brown eyes, pink conjunctiva, corneas clear. PERRL, EOM's intact.  Neck: No adenopathy, no carotid bruit, no JVD, supple, symmetrical, trachea midline and thyroid not enlarged. Resp: Clearing to auscultation bilaterally. Cardio: Regular rate and rhythm, S1, S2 normal, II/VI systolic murmur, no click, rub or gallop. GI: Soft, non-tender; bowel sounds normal; no organomegaly. Extremities: No edema, cyanosis or clubbing. Skin: Warm and dry.  Neurologic: Alert and oriented X 2, normal strength and tone. Normal coordination and gait.  Disposition: Discharge disposition: 01-Home or Self Care       Discharge Instructions     Ambulatory referral to Neurology   Complete by: As directed    [facility] for neuropsych testing   Ambulatory referral to Psychiatry   Complete by: As directed    For medication management      Allergies as of 11/07/2022       Reactions   Citrus Anaphylaxis, Itching   Fish Allergy Anaphylaxis, Other (See Comments)   Shellfish Allergy Shortness Of Breath, Other (See Comments)   "Affects thyroid" also   Adhesive [tape] Other (See Comments)   Must have paper tape only   Ibuprofen Swelling, Other (See Comments)   Face swells   Latex Dermatitis   Lipitor [atorvastatin Calcium] Other (See Comments)   Body aches   Lisinopril Other (See Comments)   PER DR. Sherene Sires (not recalled by patient)   Other Nausea Only, Other (See Comments)   Collards (gas, too)   Codeine Rash  Tramadol Palpitations        Medication List     STOP taking these medications    donepezil 5 MG tablet Commonly known as: ARICEPT   OLANZapine 20 MG tablet Commonly known as: ZYPREXA   potassium chloride 10 MEQ CR capsule Commonly known as:  MICRO-K   potassium chloride 10 MEQ tablet Commonly known as: KLOR-CON Replaced by: potassium chloride 10 MEQ tablet       TAKE these medications    acetaminophen 325 MG tablet Commonly known as: TYLENOL Take 2 tablets (650 mg total) by mouth every 4 (four) hours as needed for headache or mild pain.   albuterol 108 (90 Base) MCG/ACT inhaler Commonly known as: VENTOLIN HFA Inhale 2 puffs into the lungs 4 (four) times daily as needed for wheezing or shortness of breath. DX J44.9   amLODipine 2.5 MG tablet Commonly known as: NORVASC Take 2.5 mg by mouth daily.   aspirin EC 81 MG tablet Take 1 tablet (81 mg total) by mouth daily. Swallow whole. Start taking on: Nov 08, 2022   Blood Pressure Monitor/Wrist Kit 1 Units by Does not apply route daily at 6 (six) AM.   dapagliflozin propanediol 5 MG Tabs tablet Commonly known as: FARXIGA Take 1 tablet (5 mg total) by mouth daily before breakfast. Start taking on: Nov 08, 2022 What changed:  medication strength how much to take   freestyle lancets Use to check blood sugar once daily. Dx: E11.9   FREESTYLE LITE test strip Generic drug: glucose blood Use as instructed   FreeStyle Lite w/Device Kit 1 each by Does not apply route daily. Dx:E11.9   insulin aspart 100 UNIT/ML injection Commonly known as: novoLOG Inject 0-15 Units into the skin 3 (three) times daily with meals.   levothyroxine 50 MCG tablet Commonly known as: SYNTHROID Take 50 mcg by mouth daily.   menthol-cetylpyridinium 3 MG lozenge Commonly known as: CEPACOL Take 1 lozenge (3 mg total) by mouth as needed for sore throat.   methocarbamol 500 MG tablet Commonly known as: ROBAXIN TAKE 1 TABLET(500 MG) BY MOUTH THREE TIMES DAILY AS NEEDED FOR MUSCLE SPASMS What changed: See the new instructions.   metoprolol succinate 50 MG 24 hr tablet Commonly known as: TOPROL-XL Take 1 tablet (50 mg total) by mouth daily.   nitroGLYCERIN 0.4 MG SL tablet Commonly  known as: NITROSTAT Place 1 tablet (0.4 mg total) under the tongue as needed.   omeprazole 40 MG capsule Commonly known as: PRILOSEC Take 40 mg by mouth 2 (two) times daily.   potassium chloride 10 MEQ tablet Commonly known as: KLOR-CON M Take 1 tablet (10 mEq total) by mouth daily. Start taking on: Nov 08, 2022 Replaces: potassium chloride 10 MEQ tablet   rosuvastatin 40 MG tablet Commonly known as: CRESTOR Take 1 tablet (40 mg total) by mouth daily.   sucralfate 1 GM/10ML suspension Commonly known as: CARAFATE Take 1 g by mouth. Daughter stated patient is taking this med but unsure of how she is taking it   torsemide 20 MG tablet Commonly known as: DEMADEX Take 1 tablet (20 mg total) by mouth every Monday, Wednesday, and Friday. Start taking on: Nov 09, 2022   Tradjenta 5 MG Tabs tablet Generic drug: linagliptin Take 5 mg by mouth daily.   valproic acid 250 MG capsule Commonly known as: DEPAKENE Take 1 capsule (250 mg total) by mouth 2 (two) times daily.        Follow-up Information     [facility] Follow up.   Why: Please call to speak with your care manager, for getting services, asking questions, or finding a provider. We also encourage you to call this line to submit complaints and request printed materials. Ask about ACT team services. Contact information: [phone] Monday - Saturday, 7 a.m. - 6 p.m.        Medina-Vargas, Monina C, NP Follow up in 1 week(s).   Specialty: Internal Medicine Contact information: [address] [place] [address] [phone]         Orpah Cobb, MD Follow up in 1 month(s).   Specialty: Cardiology Contact information: [address] [place] [address] [phone]                 Time spent: Review of old chart, current chart, lab, x-ray, cardiac tests and discussion with patient over 60 minutes.  Signed: Ricki Rodriguez 11/07/2022, 5:14 PM

## 2022-11-07 NOTE — Progress Notes (Signed)
Ref: Medina-Vargas, Margit Banda, NP   Subjective:  Patient refused cardiac catheterization earlier in the day. She is confused about going home with daughter v/s staying by herself at a hotel.  Daughters are looking for in patient psych treatment. Patient remains non-compliant with medical treatment. She is off Lorazepam per family request. She is on Depakene 250 mg. Bid. CBC, BMET and blood sugars levels are stable with creatinine of 1.26  Objective:  Vital Signs in the last 24 hours: Temp:  [98.8 F (37.1 C)-99.4 F (37.4 C)] 99 F (37.2 C) (05/06 1238) Pulse Rate:  [69-106] 69 (05/06 1238) Resp:  [18-24] 19 (05/06 1238) BP: (103-125)/(60-76) 123/75 (05/06 1238) SpO2:  [98 %-100 %] 98 % (05/05 2102) Weight:  [62.8 kg] 62.8 kg (05/06 0548)  Physical Exam: BP Readings from Last 1 Encounters:  11/07/22 123/75     Wt Readings from Last 1 Encounters:  11/07/22 62.8 kg    Weight change: -2.948 kg Body mass index is 23.05 kg/m. HEENT: [place]/AT, Eyes-Brown, Conjunctiva-Pink, Sclera-Non-icteric Neck: No JVD, No bruit, Trachea midline. Lungs:  Clearing, Bilateral. Cardiac:  Regular rhythm, normal S1 and S2, no S3. II/VI systolic murmur. Abdomen:  Soft, non-tender. BS present. Extremities:  No edema present. No cyanosis. No clubbing. CNS: AxOx2, Cranial nerves grossly intact, moves all 4 extremities.  Skin: Warm and dry.   Intake/Output from previous day: 05/05 0701 - 05/06 0700 In: 480 [P.O.:480] Out: -     Lab Results: BMET    Component Value Date/Time   NA 137 11/07/2022 0218   NA 132 (L) 11/04/2022 0809   NA 134 (L) 11/03/2022 0546   NA 137 03/23/2020 1433   NA 141 03/12/2020 1529   NA 139 01/11/2019 1419   K 4.1 11/07/2022 0218   K 3.2 (L) 11/04/2022 0809   K 3.7 11/03/2022 0546   CL 96 (L) 11/07/2022 0218   CL 98 11/04/2022 0809   CL 103 11/03/2022 0546   CO2 33 (H) 11/07/2022 0218   CO2 24 11/04/2022 0809   CO2 20 (L) 11/03/2022 0546   GLUCOSE 168 (H)  11/07/2022 0218   GLUCOSE 175 (H) 11/04/2022 0809   GLUCOSE 243 (H) 11/03/2022 0546   BUN 18 11/07/2022 0218   BUN 25 (H) 11/04/2022 0809   BUN 15 11/03/2022 0546   BUN 25 03/23/2020 1433   BUN 16 03/12/2020 1529   BUN 17 01/11/2019 1419   CREATININE 1.26 (H) 11/07/2022 0218   CREATININE 1.51 (H) 11/04/2022 0809   CREATININE 1.37 (H) 11/03/2022 0546   CREATININE 1.20 (H) 11/02/2022 1524   CREATININE 1.52 (H) 08/02/2022 1449   CREATININE 1.35 (H) 06/03/2022 1528   CALCIUM 8.9 11/07/2022 0218   CALCIUM 8.2 (L) 11/04/2022 0809   CALCIUM 8.3 (L) 11/03/2022 0546   GFRNONAA 46 (L) 11/07/2022 0218   GFRNONAA 37 (L) 11/04/2022 0809   GFRNONAA 41 (L) 11/03/2022 0546   GFRNONAA 47 (L) 10/01/2020 1147   GFRNONAA 46 (L) 05/26/2020 1553   GFRNONAA 48 (L) 04/30/2020 1206   GFRAA 54 (L) 10/01/2020 1147   GFRAA 54 (L) 05/26/2020 1553   GFRAA 56 (L) 04/30/2020 1206   CBC    Component Value Date/Time   WBC 7.9 11/07/2022 0218   RBC 3.53 (L) 11/07/2022 0218   HGB 11.4 (L) 11/07/2022 0218   HCT 34.1 (L) 11/07/2022 0218   PLT 297 11/07/2022 0218   MCV 96.6 11/07/2022 0218   MCH 32.3 11/07/2022 0218   MCHC 33.4 11/07/2022 0218  RDW 16.5 (H) 11/07/2022 0218   LYMPHSABS 1.9 11/04/2022 0809   MONOABS 1.1 (H) 11/04/2022 0809   EOSABS 0.0 11/04/2022 0809   BASOSABS 0.0 11/04/2022 0809   HEPATIC Function Panel Recent Labs    04/07/22 1200 08/02/22 1449 10/04/22 1733 11/02/22 1524 11/02/22 2329 11/04/22 0809  PROT 6.6   < > 7.4 6.8 6.7 6.5  ALBUMIN  --   --  3.3*  --  2.8* 2.6*  AST 9*   < > 20 18 21 21   ALT 9   < > 13 26 29 25   ALKPHOS  --   --  70  --  76 86  BILIDIR 0.0  --   --   --   --   --   IBILI 0.1*  --   --   --   --   --    < > = values in this interval not displayed.   HEMOGLOBIN A1C Lab Results  Component Value Date   MPG 192 08/02/2022   CARDIAC ENZYMES Lab Results  Component Value Date   CKTOTAL 251 (H) 03/07/2022   CKMB 1.6 04/02/2022   TROPONINI <0.03  12/26/2016   TROPONINI <0.03 12/25/2016   BNP No results for input(s): "PROBNP" in the last 8760 hours. TSH Recent Labs    04/07/22 1200 10/04/22 1800 11/02/22 1524  TSH 1.56 0.987 1.02   CHOLESTEROL Recent Labs    03/08/22 0014 04/07/22 1200 11/03/22 0546  CHOL 215* 147 121    Scheduled Meds:  amLODipine  2.5 mg Oral Daily   aspirin EC  81 mg Oral Daily   dapagliflozin propanediol  5 mg Oral QAC breakfast   enoxaparin (LOVENOX) injection  40 mg Subcutaneous Q24H   insulin aspart  0-15 Units Subcutaneous TID WC   insulin aspart  4 Units Subcutaneous TID WC   levothyroxine  50 mcg Oral QAC breakfast   linagliptin  5 mg Oral Daily   metoprolol tartrate  12.5 mg Oral BID   pantoprazole  40 mg Oral Daily   [START ON 11/08/2022] potassium chloride  10 mEq Oral Daily   rosuvastatin  40 mg Oral Daily   sucralfate  1 g Oral BID WC   torsemide  20 mg Oral Q M,W,F   valproic acid  250 mg Oral BID   Continuous Infusions:  sodium chloride     PRN Meds:.acetaminophen, albuterol, alum & mag hydroxide-simeth, menthol-cetylpyridinium, nitroGLYCERIN, ondansetron (ZOFRAN) IV, mouth rinse  Assessment/Plan: NSTEMI 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 M-W-F. Decrease potassium to 10 meq. Daily. Psych consult for inpatient treatment at psych facility   LOS: 4 days   Time spent including chart review, lab review, examination, discussion with patient/Family/Nurse : 30 min   Orpah Cobb  MD  11/07/2022, 4:42 PM

## 2022-11-07 NOTE — Plan of Care (Signed)
Problem: Education: Goal: Understanding of cardiac disease, CV risk reduction, and recovery process will improve 11/07/2022 1839 by Genevie Ann, RN Outcome: Adequate for Discharge 11/07/2022 1101 by Genevie Ann, RN Outcome: Progressing Goal: Individualized Educational Video(s) 11/07/2022 1839 by Genevie Ann, RN Outcome: Adequate for Discharge 11/07/2022 1101 by Genevie Ann, RN Outcome: Progressing   Problem: Activity: Goal: Ability to tolerate increased activity will improve Outcome: Adequate for Discharge   Problem: Cardiac: Goal: Ability to achieve and maintain adequate cardiovascular perfusion will improve Outcome: Adequate for Discharge   Problem: Health Behavior/Discharge Planning: Goal: Ability to safely manage health-related needs after discharge will improve Outcome: Adequate for Discharge   Problem: Education: Goal: Knowledge of General Education information will improve Description: Including pain rating scale, medication(s)/side effects and non-pharmacologic comfort measures Outcome: Adequate for Discharge   Problem: Cardiac: Goal: Ability to achieve and maintain adequate cardiovascular perfusion will improve Outcome: Adequate for Discharge   Problem: Health Behavior/Discharge Planning: Goal: Ability to safely manage health-related needs after discharge will improve Outcome: Adequate for Discharge   Problem: Education: Goal: Knowledge of General Education information will improve Description: Including pain rating scale, medication(s)/side effects and non-pharmacologic comfort measures Outcome: Adequate for Discharge   Problem: Health Behavior/Discharge Planning: Goal: Ability to manage health-related needs will improve Outcome: Adequate for Discharge   Problem: Health Behavior/Discharge Planning: Goal: Ability to safely manage health-related needs after discharge will improve Outcome: Adequate for Discharge   Problem: Cardiac: Goal:  Ability to achieve and maintain adequate cardiovascular perfusion will improve Outcome: Adequate for Discharge   Problem: Education: Goal: Knowledge of General Education information will improve Description: Including pain rating scale, medication(s)/side effects and non-pharmacologic comfort measures Outcome: Adequate for Discharge   Problem: Health Behavior/Discharge Planning: Goal: Ability to manage health-related needs will improve Outcome: Adequate for Discharge   Problem: Clinical Measurements: Goal: Ability to maintain clinical measurements within normal limits will improve Outcome: Adequate for Discharge Goal: Will remain free from infection Outcome: Adequate for Discharge Goal: Diagnostic test results will improve Outcome: Adequate for Discharge Goal: Respiratory complications will improve Outcome: Adequate for Discharge Goal: Cardiovascular complication will be avoided Outcome: Adequate for Discharge   Problem: Education: Goal: Knowledge of General Education information will improve Description: Including pain rating scale, medication(s)/side effects and non-pharmacologic comfort measures Outcome: Adequate for Discharge   Problem: Health Behavior/Discharge Planning: Goal: Ability to manage health-related needs will improve Outcome: Adequate for Discharge   Problem: Clinical Measurements: Goal: Ability to maintain clinical measurements within normal limits will improve Outcome: Adequate for Discharge Goal: Will remain free from infection Outcome: Adequate for Discharge Goal: Diagnostic test results will improve Outcome: Adequate for Discharge Goal: Respiratory complications will improve Outcome: Adequate for Discharge Goal: Cardiovascular complication will be avoided Outcome: Adequate for Discharge   Problem: Activity: Goal: Risk for activity intolerance will decrease Outcome: Adequate for Discharge   Problem: Nutrition: Goal: Adequate nutrition will be  maintained Outcome: Adequate for Discharge   Problem: Activity: Goal: Risk for activity intolerance will decrease Outcome: Adequate for Discharge   Problem: Nutrition: Goal: Adequate nutrition will be maintained Outcome: Adequate for Discharge   Problem: Coping: Goal: Level of anxiety will decrease Outcome: Adequate for Discharge   Problem: Nutrition: Goal: Adequate nutrition will be maintained Outcome: Adequate for Discharge   Problem: Coping: Goal: Level of anxiety will decrease Outcome: Adequate for Discharge   Problem: Elimination: Goal: Will not experience complications related to bowel motility Outcome: Adequate for Discharge Goal: Will not experience complications related  to urinary retention Outcome: Adequate for Discharge   Problem: Coping: Goal: Level of anxiety will decrease Outcome: Adequate for Discharge   Problem: Elimination: Goal: Will not experience complications related to bowel motility Outcome: Adequate for Discharge Goal: Will not experience complications related to urinary retention Outcome: Adequate for Discharge   Problem: Pain Managment: Goal: General experience of comfort will improve Outcome: Adequate for Discharge   Problem: Pain Managment: Goal: General experience of comfort will improve Outcome: Adequate for Discharge   Problem: Safety: Goal: Ability to remain free from injury will improve Outcome: Adequate for Discharge   Problem: Skin Integrity: Goal: Risk for impaired skin integrity will decrease Outcome: Adequate for Discharge   Problem: Safety: Goal: Ability to remain free from injury will improve Outcome: Adequate for Discharge   Problem: Skin Integrity: Goal: Risk for impaired skin integrity will decrease Outcome: Adequate for Discharge   Problem: Pain Managment: Goal: General experience of comfort will improve Outcome: Adequate for Discharge   Problem: Safety: Goal: Ability to remain free from injury will  improve Outcome: Adequate for Discharge   Problem: Skin Integrity: Goal: Risk for impaired skin integrity will decrease Outcome: Adequate for Discharge   Problem: Education: Goal: Ability to describe self-care measures that may prevent or decrease complications (Diabetes Survival Skills Education) will improve Outcome: Adequate for Discharge Goal: Individualized Educational Video(s) Outcome: Adequate for Discharge   Problem: Skin Integrity: Goal: Risk for impaired skin integrity will decrease Outcome: Adequate for Discharge   Problem: Education: Goal: Ability to describe self-care measures that may prevent or decrease complications (Diabetes Survival Skills Education) will improve Outcome: Adequate for Discharge Goal: Individualized Educational Video(s) Outcome: Adequate for Discharge   Problem: Coping: Goal: Ability to adjust to condition or change in health will improve Outcome: Adequate for Discharge   Problem: Education: Goal: Ability to describe self-care measures that may prevent or decrease complications (Diabetes Survival Skills Education) will improve Outcome: Adequate for Discharge Goal: Individualized Educational Video(s) Outcome: Adequate for Discharge   Problem: Coping: Goal: Ability to adjust to condition or change in health will improve Outcome: Adequate for Discharge   Problem: Fluid Volume: Goal: Ability to maintain a balanced intake and output will improve Outcome: Adequate for Discharge   Problem: Coping: Goal: Ability to adjust to condition or change in health will improve Outcome: Adequate for Discharge   Problem: Fluid Volume: Goal: Ability to maintain a balanced intake and output will improve Outcome: Adequate for Discharge   Problem: Health Behavior/Discharge Planning: Goal: Ability to identify and utilize available resources and services will improve Outcome: Adequate for Discharge Goal: Ability to manage health-related needs will  improve Outcome: Adequate for Discharge   Problem: Health Behavior/Discharge Planning: Goal: Ability to identify and utilize available resources and services will improve Outcome: Adequate for Discharge Goal: Ability to manage health-related needs will improve Outcome: Adequate for Discharge   Problem: Health Behavior/Discharge Planning: Goal: Ability to identify and utilize available resources and services will improve Outcome: Adequate for Discharge Goal: Ability to manage health-related needs will improve Outcome: Adequate for Discharge   Problem: Health Behavior/Discharge Planning: Goal: Ability to identify and utilize available resources and services will improve Outcome: Adequate for Discharge Goal: Ability to manage health-related needs will improve Outcome: Adequate for Discharge   Problem: Health Behavior/Discharge Planning: Goal: Ability to identify and utilize available resources and services will improve Outcome: Adequate for Discharge Goal: Ability to manage health-related needs will improve Outcome: Adequate for Discharge   Problem: Fluid Volume: Goal: Ability to  maintain a balanced intake and output will improve Outcome: Adequate for Discharge   Problem: Metabolic: Goal: Ability to maintain appropriate glucose levels will improve Outcome: Adequate for Discharge   Problem: Metabolic: Goal: Ability to maintain appropriate glucose levels will improve Outcome: Adequate for Discharge   Problem: Metabolic: Goal: Ability to maintain appropriate glucose levels will improve Outcome: Adequate for Discharge   Problem: Metabolic: Goal: Ability to maintain appropriate glucose levels will improve Outcome: Adequate for Discharge   Problem: Metabolic: Goal: Ability to maintain appropriate glucose levels will improve Outcome: Adequate for Discharge   Problem: Nutritional: Goal: Maintenance of adequate nutrition will improve Outcome: Adequate for Discharge Goal:  Progress toward achieving an optimal weight will improve Outcome: Adequate for Discharge   Problem: Nutritional: Goal: Maintenance of adequate nutrition will improve Outcome: Adequate for Discharge Goal: Progress toward achieving an optimal weight will improve Outcome: Adequate for Discharge   Problem: Nutritional: Goal: Maintenance of adequate nutrition will improve Outcome: Adequate for Discharge Goal: Progress toward achieving an optimal weight will improve Outcome: Adequate for Discharge   Problem: Nutritional: Goal: Maintenance of adequate nutrition will improve Outcome: Adequate for Discharge Goal: Progress toward achieving an optimal weight will improve Outcome: Adequate for Discharge   Problem: Nutritional: Goal: Maintenance of adequate nutrition will improve Outcome: Adequate for Discharge Goal: Progress toward achieving an optimal weight will improve Outcome: Adequate for Discharge   Problem: Nutritional: Goal: Maintenance of adequate nutrition will improve Outcome: Adequate for Discharge Goal: Progress toward achieving an optimal weight will improve Outcome: Adequate for Discharge   Problem: Skin Integrity: Goal: Risk for impaired skin integrity will decrease Outcome: Adequate for Discharge   Problem: Skin Integrity: Goal: Risk for impaired skin integrity will decrease Outcome: Adequate for Discharge   Problem: Skin Integrity: Goal: Risk for impaired skin integrity will decrease Outcome: Adequate for Discharge   Problem: Skin Integrity: Goal: Risk for impaired skin integrity will decrease Outcome: Adequate for Discharge   Problem: Tissue Perfusion: Goal: Adequacy of tissue perfusion will improve Outcome: Adequate for Discharge   Problem: Skin Integrity: Goal: Risk for impaired skin integrity will decrease Outcome: Adequate for Discharge   Problem: Tissue Perfusion: Goal: Adequacy of tissue perfusion will improve Outcome: Adequate for Discharge    Problem: Skin Integrity: Goal: Risk for impaired skin integrity will decrease Outcome: Adequate for Discharge   Problem: Tissue Perfusion: Goal: Adequacy of tissue perfusion will improve Outcome: Adequate for Discharge

## 2022-11-07 NOTE — Plan of Care (Signed)
°  Problem: Education: °Goal: Understanding of cardiac disease, CV risk reduction, and recovery process will improve °Outcome: Progressing °Goal: Individualized Educational Video(s) °Outcome: Progressing °  °

## 2022-11-08 ENCOUNTER — Other Ambulatory Visit: Payer: Self-pay | Admitting: Adult Health

## 2022-11-08 ENCOUNTER — Telehealth: Payer: Self-pay

## 2022-11-08 ENCOUNTER — Other Ambulatory Visit: Payer: Self-pay

## 2022-11-08 DIAGNOSIS — J449 Chronic obstructive pulmonary disease, unspecified: Secondary | ICD-10-CM

## 2022-11-08 MED ORDER — ALBUTEROL SULFATE (2.5 MG/3ML) 0.083% IN NEBU: 2.5000 mg | INHALATION_SOLUTION | Freq: Four times a day (QID) | RESPIRATORY_TRACT | 1 refills | Status: DC | PRN

## 2022-11-08 NOTE — Telephone Encounter (Signed)
error 

## 2022-11-08 NOTE — Transitions of Care (Post Inpatient/ED Visit) (Signed)
11/08/2022  Name: [name] Long MRN: [id number] DOB: [birth year]  Today's TOC FU Call Status: Today's TOC FU Call Status:: Successful TOC FU Call Competed TOC FU Call Complete Date: 11/08/22  Transition Care Management Follow-up Telephone Call Date of Discharge: 11/07/22 Discharge Facility: [facility]) Type of Discharge: Inpatient Admission Primary Inpatient Discharge Diagnosis:: Acute Coronary Syndrome How have you been since you were released from the hospital?: Better (Daughter says her Mom is feeling better but is having delusions.) Any questions or concerns?: Yes Patient Questions/Concerns:: My Mom did not get a prescription for a nebulizer machine and liquid Ventolin Patient Questions/Concerns Addressed: Notified Provider of Patient Questions/Concerns  Items Reviewed: Did you receive and understand the discharge instructions provided?: Yes Medications obtained,verified, and reconciled?: Yes (Medications Reviewed) Any new allergies since your discharge?: No Dietary orders reviewed?: No Do you have support at home?: Yes People in Home: child(ren), adult Name of Support/Comfort Primary Source: Cassandra  Medications Reviewed Today: Medications Reviewed Today     Reviewed by Herma Carson, RN (Registered Nurse) on 11/03/22 at 1312  Med List Status: Complete   Medication Order Taking? Sig Documenting Provider Last Dose Status Informant  albuterol (VENTOLIN HFA) 108 (90 Base) MCG/ACT inhaler [id number] Yes Inhale 2 puffs into the lungs 4 (four) times daily as needed for wheezing or shortness of breath. DX J44.9 Medina-Vargas, Margit Banda, NP Past Month Active Pharmacy Records, Family Member           Med Note Michaell Cowing   Wed Nov 02, 2022  2:39 PM)    amLODipine (NORVASC) 2.5 MG tablet [id number] Yes Take 2.5 mg by mouth daily. [provider] 11/02/2022 Active Family Member, Pharmacy Records  Blood Glucose Monitoring Suppl (FREESTYLE LITE) w/Device KIT [id number]  No 1 each by Does not apply route daily. Dx:E11.9  Patient not taking: Reported on 11/03/2022   Ngetich, Donalee Citrin, NP Not Taking Active Pharmacy Records, Family Member  Blood Pressure Monitoring (BLOOD PRESSURE MONITOR/WRIST) KIT [id number] No 1 Units by Does not apply route daily at 6 (six) AM.  Patient not taking: Reported on 11/03/2022   Ngetich, Donalee Citrin, NP Not Taking Active Pharmacy Records, Family Member  dapagliflozin propanediol (FARXIGA) 10 MG TABS tablet [id number] Yes Take 1 tablet (10 mg total) by mouth daily before breakfast. Medina-Vargas, Monina C, NP 11/02/2022 Active Pharmacy Records, Family Member           Med Note Michaell Cowing   Wed Nov 02, 2022  2:39 PM)    donepezil (ARICEPT) 5 MG tablet [id number] Yes Take 5 mg by mouth at bedtime. [provider] 11/02/2022 Active Family Member, Pharmacy Records  glucose blood (FREESTYLE LITE) test strip [id number] No Use as instructed  Patient not taking: Reported on 11/03/2022   Gillis Santa, NP Not Taking Active Pharmacy Records, Family Member           Med Note Michaell Cowing   Wed Nov 02, 2022  2:39 PM)    Lancets (FREESTYLE) lancets [id number] No Use to check blood sugar once daily. Dx: E11.9  Patient not taking: Reported on 11/03/2022   Medina-Vargas, Margit Banda, NP Not Taking Active Pharmacy Records, Family Member  levothyroxine (SYNTHROID) 50 MCG tablet [id number] Yes Take 50 mcg by mouth daily. [provider] 11/02/2022 Active Pharmacy Records, Family Member           Med Note Michaell Cowing   Wed Nov 02, 2022  2:39 PM)    methocarbamol (ROBAXIN) 500 MG  tablet [id number] Yes TAKE 1 TABLET(500 MG) BY MOUTH THREE TIMES DAILY AS NEEDED FOR MUSCLE SPASMS  Patient taking differently: Take 500 mg by mouth 3 (three) times daily between meals as needed for muscle spasms.   Adonis Huguenin, NP 11/02/2022 Active Family Member, Pharmacy Records  metoprolol succinate (TOPROL-XL) 50 MG 24 hr tablet [id number] Yes Take 1 tablet (50  mg total) by mouth daily. Medina-Vargas, Monina C, NP 11/02/2022 Active Family Member, Pharmacy Records  nitroGLYCERIN (NITROSTAT) 0.4 MG SL tablet [id number] Yes Place 1 tablet (0.4 mg total) under the tongue as needed. Sarina Ill, DO 11/02/2022 Active Pharmacy Records, Family Member           Med Note Michaell Cowing   Wed Nov 02, 2022  2:42 PM)    OLANZapine (ZYPREXA) 20 MG tablet [id number] Yes Take 1 tablet (20 mg total) by mouth at bedtime. Medina-Vargas, Monina C, NP 11/02/2022 Active Family Member, Pharmacy Records  omeprazole (PRILOSEC) 40 MG capsule [id number] Yes Take 40 mg by mouth 2 (two) times daily. [provider] 11/02/2022 Active Pharmacy Records, Family Member           Med Note Michaell Cowing   Wed Nov 02, 2022  2:42 PM)    potassium chloride (KLOR-CON) 10 MEQ tablet [id number] Yes Take 10 mEq by mouth 2 (two) times daily. [provider] 11/02/2022 Active Pharmacy Records, Family Member           Med Note Michaell Cowing   Wed Nov 02, 2022  2:42 PM)    potassium chloride (MICRO-K) 10 MEQ CR capsule [id number] Yes Take 10 mEq by mouth every other day. [provider] 11/02/2022 Active Family Member, Pharmacy Records  rosuvastatin (CRESTOR) 40 MG tablet [id number] No Take 1 tablet (40 mg total) by mouth daily.  Patient not taking: Reported on 11/03/2022   Sarina Ill, DO Not Taking Active Pharmacy Records, Family Member           Med Note Michaell Cowing   Wed Nov 02, 2022  2:42 PM)    sucralfate (CARAFATE) 1 GM/10ML suspension [id number] No Take 1 g by mouth. Daughter stated patient is taking this med but unsure of how she is taking it  Patient not taking: Reported on 11/03/2022   [provider] Not Taking Active Family Member, Pharmacy Records           Med Note Michaell Cowing   Wed Nov 02, 2022  2:43 PM)    TRADJENTA 5 MG TABS tablet [id number] No Take 5 mg by mouth daily.  Patient not taking: Reported on 11/03/2022   [provider] Not Taking Active Pharmacy Records, Family Member           Med Note Michaell Cowing   Wed Nov 02, 2022  2:43 PM)    valproic acid (DEPAKENE) 250 MG capsule [id number] No Take 1 capsule (250 mg total) by mouth 2 (two) times daily.  Patient not taking: Reported on 11/03/2022   Medina-Vargas, Margit Banda, NP Not Taking Active Family Member, Pharmacy Records            Home Care and Equipment/Supplies: Were Home Health Services Ordered?: No Any new equipment or medical supplies ordered?: No  Functional Questionnaire: Do you need assistance with bathing/showering or dressing?: No Do you need assistance with meal preparation?: No Do you need assistance with eating?: No Do you have difficulty maintaining continence: No Do you need assistance with getting out of bed/getting out  of a chair/moving?: No Do you have difficulty managing or taking your medications?: Yes (daughter manages)  Follow up appointments reviewed: PCP Follow-up appointment confirmed?: Yes Date of PCP follow-up appointment?: 12/08/22 Follow-up Provider: Kenard Gower, NP Specialist Hospital Follow-up appointment confirmed?: No Reason Specialist Follow-Up Not Confirmed: Patient has Specialist Provider Number and will Call for Appointment (Daughter to call [facility] for behavioral health) Do you need transportation to your follow-up appointment?: No Do you understand care options if your condition(s) worsen?: Yes-patient verbalized understanding Interventions Today    Flowsheet Row Most Recent Value  Chronic Disease   Chronic disease during today's visit Hypertension (HTN), Other  [Acute Coronary Syndrome]  General Interventions   General Interventions Discussed/Reviewed General Interventions Discussed  Pharmacy Interventions   Pharmacy Dicussed/Reviewed Pharmacy Topics Discussed       TOC Interventions Today    Flowsheet Row Most Recent Value  TOC Interventions   TOC  Interventions Discussed/Reviewed TOC Interventions Discussed, Contacted provider for patient needs  [Nebulizer and Ventolin]       Jodelle Gross, RN, BSN, CCM Care Management Coordinator [place] Phone: [phone]/Fax: [phone]

## 2022-11-08 NOTE — Transitions of Care (Post Inpatient/ED Visit) (Signed)
   11/08/2022  Name: [name] Long MRN: [id number] DOB: 17-Nov-1950  Today's TOC FU Call Status: Message sent to patient's PCP that she needs Albuterol for a nebulizer and a new nebulizer machine.  Received message back that they had been sent to her pharmacy.  Call placed to Cassandra (daughter and POA) and let her know that the prescription was sent and if the pharmacy does not have the nebulizer to call PCP office and request prescription be sent to her preferred DME company, per Evie at PCP office.  Jodelle Gross, RN, BSN, CCM Care Management Coordinator [place] Phone: [phone]/Fax: [phone]

## 2022-11-15 NOTE — Addendum Note (Signed)
Addended by: Kenard Gower C on: 11/15/2022 01:05 PM   Modules accepted: Orders

## 2022-11-16 ENCOUNTER — Inpatient Hospital Stay (HOSPITAL_COMMUNITY)
Admission: EM | Admit: 2022-11-16 | Discharge: 2022-11-20 | DRG: 280 | Disposition: A | Discharge status: Home / Self Care | Payer: Medicare Other | Attending: Internal Medicine | Admitting: Internal Medicine

## 2022-11-16 ENCOUNTER — Other Ambulatory Visit: Payer: Self-pay

## 2022-11-16 ENCOUNTER — Encounter (HOSPITAL_COMMUNITY): Payer: Self-pay

## 2022-11-16 ENCOUNTER — Emergency Department (HOSPITAL_COMMUNITY): Payer: Medicare Other

## 2022-11-16 DIAGNOSIS — K760 Fatty (change of) liver, not elsewhere classified: Secondary | ICD-10-CM | POA: Diagnosis present

## 2022-11-16 DIAGNOSIS — Z888 Allergy status to other drugs, medicaments and biological substances status: Secondary | ICD-10-CM

## 2022-11-16 DIAGNOSIS — E039 Hypothyroidism, unspecified: Secondary | ICD-10-CM | POA: Diagnosis present

## 2022-11-16 DIAGNOSIS — N1831 Chronic kidney disease, stage 3a: Secondary | ICD-10-CM | POA: Diagnosis present

## 2022-11-16 DIAGNOSIS — E7800 Pure hypercholesterolemia, unspecified: Secondary | ICD-10-CM | POA: Diagnosis present

## 2022-11-16 DIAGNOSIS — G9349 Other encephalopathy: Secondary | ICD-10-CM | POA: Diagnosis present

## 2022-11-16 DIAGNOSIS — R0902 Hypoxemia: Secondary | ICD-10-CM | POA: Diagnosis present

## 2022-11-16 DIAGNOSIS — Z885 Allergy status to narcotic agent status: Secondary | ICD-10-CM

## 2022-11-16 DIAGNOSIS — N182 Chronic kidney disease, stage 2 (mild): Secondary | ICD-10-CM | POA: Diagnosis present

## 2022-11-16 DIAGNOSIS — Z803 Family history of malignant neoplasm of breast: Secondary | ICD-10-CM

## 2022-11-16 DIAGNOSIS — Z7984 Long term (current) use of oral hypoglycemic drugs: Secondary | ICD-10-CM

## 2022-11-16 DIAGNOSIS — Z8249 Family history of ischemic heart disease and other diseases of the circulatory system: Secondary | ICD-10-CM

## 2022-11-16 DIAGNOSIS — I130 Hypertensive heart and chronic kidney disease with heart failure and stage 1 through stage 4 chronic kidney disease, or unspecified chronic kidney disease: Principal | ICD-10-CM | POA: Diagnosis present

## 2022-11-16 DIAGNOSIS — R4589 Other symptoms and signs involving emotional state: Secondary | ICD-10-CM | POA: Diagnosis not present

## 2022-11-16 DIAGNOSIS — Z91040 Latex allergy status: Secondary | ICD-10-CM

## 2022-11-16 DIAGNOSIS — R0989 Other specified symptoms and signs involving the circulatory and respiratory systems: Secondary | ICD-10-CM | POA: Diagnosis not present

## 2022-11-16 DIAGNOSIS — I2510 Atherosclerotic heart disease of native coronary artery without angina pectoris: Secondary | ICD-10-CM | POA: Diagnosis not present

## 2022-11-16 DIAGNOSIS — T50916A Underdosing of multiple unspecified drugs, medicaments and biological substances, initial encounter: Secondary | ICD-10-CM | POA: Diagnosis present

## 2022-11-16 DIAGNOSIS — Z800 Family history of malignant neoplasm of digestive organs: Secondary | ICD-10-CM

## 2022-11-16 DIAGNOSIS — F250 Schizoaffective disorder, bipolar type: Secondary | ICD-10-CM | POA: Diagnosis present

## 2022-11-16 DIAGNOSIS — Z743 Need for continuous supervision: Secondary | ICD-10-CM | POA: Diagnosis not present

## 2022-11-16 DIAGNOSIS — Z8701 Personal history of pneumonia (recurrent): Secondary | ICD-10-CM

## 2022-11-16 DIAGNOSIS — Z86010 Personal history of colonic polyps: Secondary | ICD-10-CM | POA: Diagnosis not present

## 2022-11-16 DIAGNOSIS — I081 Rheumatic disorders of both mitral and tricuspid valves: Secondary | ICD-10-CM | POA: Diagnosis present

## 2022-11-16 DIAGNOSIS — Z794 Long term (current) use of insulin: Secondary | ICD-10-CM | POA: Diagnosis not present

## 2022-11-16 DIAGNOSIS — I5043 Acute on chronic combined systolic (congestive) and diastolic (congestive) heart failure: Secondary | ICD-10-CM | POA: Diagnosis present

## 2022-11-16 DIAGNOSIS — I482 Chronic atrial fibrillation, unspecified: Secondary | ICD-10-CM | POA: Diagnosis not present

## 2022-11-16 DIAGNOSIS — F23 Brief psychotic disorder: Secondary | ICD-10-CM

## 2022-11-16 DIAGNOSIS — J9811 Atelectasis: Secondary | ICD-10-CM | POA: Diagnosis present

## 2022-11-16 DIAGNOSIS — R0602 Shortness of breath: Secondary | ICD-10-CM | POA: Diagnosis not present

## 2022-11-16 DIAGNOSIS — E876 Hypokalemia: Principal | ICD-10-CM | POA: Diagnosis present

## 2022-11-16 DIAGNOSIS — R000 Tachycardia, unspecified: Secondary | ICD-10-CM | POA: Diagnosis not present

## 2022-11-16 DIAGNOSIS — Z79899 Other long term (current) drug therapy: Secondary | ICD-10-CM

## 2022-11-16 DIAGNOSIS — E119 Type 2 diabetes mellitus without complications: Secondary | ICD-10-CM | POA: Diagnosis not present

## 2022-11-16 DIAGNOSIS — I4891 Unspecified atrial fibrillation: Secondary | ICD-10-CM | POA: Diagnosis present

## 2022-11-16 DIAGNOSIS — E1122 Type 2 diabetes mellitus with diabetic chronic kidney disease: Secondary | ICD-10-CM | POA: Diagnosis present

## 2022-11-16 DIAGNOSIS — J449 Chronic obstructive pulmonary disease, unspecified: Secondary | ICD-10-CM | POA: Diagnosis present

## 2022-11-16 DIAGNOSIS — F0283 Dementia in other diseases classified elsewhere, unspecified severity, with mood disturbance: Secondary | ICD-10-CM | POA: Diagnosis present

## 2022-11-16 DIAGNOSIS — F02818 Dementia in other diseases classified elsewhere, unspecified severity, with other behavioral disturbance: Secondary | ICD-10-CM | POA: Diagnosis present

## 2022-11-16 DIAGNOSIS — F209 Schizophrenia, unspecified: Secondary | ICD-10-CM

## 2022-11-16 DIAGNOSIS — I214 Non-ST elevation (NSTEMI) myocardial infarction: Secondary | ICD-10-CM | POA: Diagnosis present

## 2022-11-16 DIAGNOSIS — J189 Pneumonia, unspecified organism: Secondary | ICD-10-CM | POA: Diagnosis not present

## 2022-11-16 DIAGNOSIS — Z83438 Family history of other disorder of lipoprotein metabolism and other lipidemia: Secondary | ICD-10-CM

## 2022-11-16 DIAGNOSIS — Z825 Family history of asthma and other chronic lower respiratory diseases: Secondary | ICD-10-CM

## 2022-11-16 DIAGNOSIS — F17210 Nicotine dependence, cigarettes, uncomplicated: Secondary | ICD-10-CM | POA: Diagnosis present

## 2022-11-16 DIAGNOSIS — Z886 Allergy status to analgesic agent status: Secondary | ICD-10-CM

## 2022-11-16 DIAGNOSIS — F29 Unspecified psychosis not due to a substance or known physiological condition: Secondary | ICD-10-CM | POA: Diagnosis not present

## 2022-11-16 DIAGNOSIS — Z7982 Long term (current) use of aspirin: Secondary | ICD-10-CM

## 2022-11-16 DIAGNOSIS — I509 Heart failure, unspecified: Secondary | ICD-10-CM | POA: Diagnosis not present

## 2022-11-16 DIAGNOSIS — E871 Hypo-osmolality and hyponatremia: Secondary | ICD-10-CM | POA: Diagnosis present

## 2022-11-16 DIAGNOSIS — Z818 Family history of other mental and behavioral disorders: Secondary | ICD-10-CM

## 2022-11-16 DIAGNOSIS — Z91138 Patient's unintentional underdosing of medication regimen for other reason: Secondary | ICD-10-CM

## 2022-11-16 DIAGNOSIS — Z823 Family history of stroke: Secondary | ICD-10-CM

## 2022-11-16 DIAGNOSIS — Z833 Family history of diabetes mellitus: Secondary | ICD-10-CM

## 2022-11-16 DIAGNOSIS — I5021 Acute systolic (congestive) heart failure: Secondary | ICD-10-CM | POA: Diagnosis not present

## 2022-11-16 DIAGNOSIS — I480 Paroxysmal atrial fibrillation: Secondary | ICD-10-CM | POA: Diagnosis present

## 2022-11-16 LAB — COMPREHENSIVE METABOLIC PANEL
ALT: 15 U/L (ref 0–44)
AST: 15 U/L (ref 15–41)
Albumin: 2.2 g/dL — ABNORMAL LOW (ref 3.5–5.0)
Alkaline Phosphatase: 73 U/L (ref 38–126)
Anion gap: 11 (ref 5–15)
BUN: 14 mg/dL (ref 8–23)
CO2: 21 mmol/L — ABNORMAL LOW (ref 22–32)
Calcium: 8 mg/dL — ABNORMAL LOW (ref 8.9–10.3)
Chloride: 96 mmol/L — ABNORMAL LOW (ref 98–111)
Creatinine, Ser: 1.28 mg/dL — ABNORMAL HIGH (ref 0.44–1.00)
GFR, Estimated: 45 mL/min — ABNORMAL LOW (ref >60)
Glucose, Bld: 152 mg/dL — ABNORMAL HIGH (ref 70–99)
Potassium: 2.4 mmol/L — CL (ref 3.5–5.1)
Sodium: 128 mmol/L — ABNORMAL LOW (ref 135–145)
Total Bilirubin: 1.7 mg/dL — ABNORMAL HIGH (ref 0.3–1.2)
Total Protein: 6.6 g/dL (ref 6.5–8.1)

## 2022-11-16 LAB — CBC WITH DIFFERENTIAL/PLATELET
Abs Immature Granulocytes: 0.06 10*3/uL (ref 0.00–0.07)
Basophils Absolute: 0 10*3/uL (ref 0.0–0.1)
Basophils Relative: 0 %
Eosinophils Absolute: 0 10*3/uL (ref 0.0–0.5)
Eosinophils Relative: 0 %
HCT: 29.8 % — ABNORMAL LOW (ref 36.0–46.0)
Hemoglobin: 9.9 g/dL — ABNORMAL LOW (ref 12.0–15.0)
Immature Granulocytes: 1 %
Lymphocytes Relative: 19 %
Lymphs Abs: 1.3 10*3/uL (ref 0.7–4.0)
MCH: 31.6 pg (ref 26.0–34.0)
MCHC: 33.2 g/dL (ref 30.0–36.0)
MCV: 95.2 fL (ref 80.0–100.0)
Monocytes Absolute: 0.4 10*3/uL (ref 0.1–1.0)
Monocytes Relative: 5 %
Neutro Abs: 5.1 10*3/uL (ref 1.7–7.7)
Neutrophils Relative %: 75 %
Platelets: 312 10*3/uL (ref 150–400)
RBC: 3.13 MIL/uL — ABNORMAL LOW (ref 3.87–5.11)
RDW: 15.9 % — ABNORMAL HIGH (ref 11.5–15.5)
WBC: 6.8 10*3/uL (ref 4.0–10.5)
nRBC: 0 % (ref 0.0–0.2)

## 2022-11-16 LAB — TROPONIN I (HIGH SENSITIVITY)
Troponin I (High Sensitivity): 17 ng/L (ref <18)
Troponin I (High Sensitivity): 37 ng/L — ABNORMAL HIGH (ref <18)

## 2022-11-16 LAB — VALPROIC ACID LEVEL: Valproic Acid Lvl: <10 ug/mL — ABNORMAL LOW (ref 50.0–100.0)

## 2022-11-16 LAB — BRAIN NATRIURETIC PEPTIDE: B Natriuretic Peptide: 638.9 pg/mL — ABNORMAL HIGH (ref 0.0–100.0)

## 2022-11-16 MED ORDER — ONDANSETRON HCL 4 MG PO TABS
4.0000 mg | ORAL_TABLET | Freq: Four times a day (QID) | ORAL | Status: DC | PRN
  Administered 2022-11-18: 4 mg via ORAL
  Filled 2022-11-16: qty 1

## 2022-11-16 MED ORDER — ONDANSETRON HCL 4 MG/2ML IJ SOLN: 4.0000 mg | Freq: Four times a day (QID) | INTRAMUSCULAR | Status: DC | PRN

## 2022-11-16 MED ORDER — ACETAMINOPHEN 500 MG PO TABS
500.0000 mg | ORAL_TABLET | Freq: Four times a day (QID) | ORAL | Status: DC | PRN
  Administered 2022-11-16 – 2022-11-20 (×6): 500 mg via ORAL
  Filled 2022-11-16 (×6): qty 1

## 2022-11-16 MED ORDER — POTASSIUM CHLORIDE 10 MEQ/100ML IV SOLN: 10.0000 meq | INTRAVENOUS | Status: DC

## 2022-11-16 MED ORDER — FUROSEMIDE 10 MG/ML IJ SOLN
20.0000 mg | Freq: Two times a day (BID) | INTRAMUSCULAR | Status: DC
  Administered 2022-11-17 – 2022-11-18 (×3): 20 mg via INTRAVENOUS
  Filled 2022-11-16 (×3): qty 2

## 2022-11-16 MED ORDER — POTASSIUM CHLORIDE 20 MEQ PO PACK
40.0000 meq | PACK | Freq: Once | ORAL | Status: AC
  Administered 2022-11-16: 40 meq via ORAL
  Filled 2022-11-16: qty 2

## 2022-11-16 MED ORDER — SENNOSIDES-DOCUSATE SODIUM 8.6-50 MG PO TABS: 1.0000 | ORAL_TABLET | Freq: Every evening | ORAL | Status: DC | PRN

## 2022-11-16 MED ORDER — ENOXAPARIN SODIUM 40 MG/0.4ML IJ SOSY
40.0000 mg | PREFILLED_SYRINGE | INTRAMUSCULAR | Status: DC
  Administered 2022-11-16 – 2022-11-19 (×4): 40 mg via SUBCUTANEOUS
  Filled 2022-11-16 (×5): qty 0.4

## 2022-11-16 MED ORDER — ACETAMINOPHEN 650 MG RE SUPP: 650.0000 mg | Freq: Four times a day (QID) | RECTAL | Status: DC | PRN

## 2022-11-16 MED ORDER — FUROSEMIDE 10 MG/ML IJ SOLN
20.0000 mg | Freq: Once | INTRAMUSCULAR | Status: AC
  Administered 2022-11-16: 20 mg via INTRAVENOUS
  Filled 2022-11-16: qty 2

## 2022-11-16 MED ORDER — POTASSIUM CHLORIDE 10 MEQ/100ML IV SOLN
10.0000 meq | INTRAVENOUS | Status: AC
  Administered 2022-11-16 (×3): 10 meq via INTRAVENOUS
  Filled 2022-11-16 (×3): qty 100

## 2022-11-16 NOTE — ED Notes (Signed)
Critical lab K+ 2.4 given face to face to Dr. Rubin Payor

## 2022-11-16 NOTE — ED Notes (Signed)
Pt is a&ox4, warm and dry to touch. Pt complains of shob and is tachypneic. Pt denies chest pain. Pt is irritable and states that she needs breathing treatments but her nebulizer is in storage. Pt changed into a gown, attached to monitor/vitals. Side rails up x 2, call light within reach. Family at the bedside.

## 2022-11-16 NOTE — H&P (Signed)
History and Physical    Patient: [name] Long DOB: 1950-10-18 DOA: 11/16/2022 DOS: the patient was seen and examined on 11/16/2022 PCP: Gillis Santa, NP  Patient coming from: Home  Chief Complaint:  Chief Complaint  Patient presents with   Shortness of Breath   HPI: [name] Long is a 72 y.o. female with medical history significant of   Review of Systems: The patient is currently unreliable due to her psychiatric condition therefore review of systems is deferred.  Past Medical History:  Diagnosis Date   Abscessed tooth 02/09/2020   Acute on chronic congestive heart failure (HCC)    Adenomatous colon polyp    Arthritis    "real bad; all over" (07/12/2017)   Asthma    Atrial fibrillation (HCC)    BIPOLAR DISORDER 08/31/2006   Qualifier: Diagnosis of  By: Irving Burton MD, Clifton Custard     CHRONIC KIDNEY DISEASE STAGE II (MILD) 09/14/2009   Annotation: eGFR 64 Qualifier: Diagnosis of  By: Louanne Belton MD, Rolm Gala     Chronic lower back pain    Congestive heart failure (CHF) (HCC)    COPD (chronic obstructive pulmonary disease) (HCC) 09/23/2010   Diagnosed at [facility] in 2008 (Dr. Maple Hudson)    DM (diabetes mellitus) type II controlled with renal manifestation (HCC) 08/31/2006   Qualifier: Diagnosis of  By: Irving Burton MD, Clifton Custard     Dyspnea    "all my life; since 6th grade" (07/12/2017)   Fatty liver    HYPERCHOLESTEROLEMIA 08/31/2006   Intolerance to Lipitor OK on Crestor but medicaid no longer covering     HYPERTENSION, BENIGN SYSTEMIC 08/31/2006   Qualifier: Diagnosis of  By: Irving Burton MD, Aaron     Hypokalemia    HYPOTHYROIDISM, UNSPECIFIED 08/31/2006   Qualifier: Diagnosis of  By: Irving Burton MD, Clifton Custard     Leg swelling 03/14/2018   Pneumonia    "3 times" (07/12/2017)   Pulmonary nodule    Renal cyst    Schizophrenia (HCC)    Scoliosis    Stomach problems    Thyroid disorder    Past Surgical History:  Procedure Laterality Date   CESAREAN SECTION     FOOT FRACTURE  SURGERY Right    "steel plate in it"   FOOT SURGERY     " born w/dislocated foot"   FRACTURE SURGERY     KNEE ARTHROSCOPY Right    TOE SURGERY Bilateral    "both pinky toes"   TONSILLECTOMY AND ADENOIDECTOMY     Social History:  reports that she has been smoking cigarettes. She has a 45.00 pack-year smoking history. She has never used smokeless tobacco. She reports that she does not currently use alcohol. She reports that she does not use drugs.  Allergies  Allergen Reactions   Citrus Anaphylaxis and Itching   Fish Allergy Anaphylaxis and Other (See Comments)   Shellfish Allergy Shortness Of Breath and Other (See Comments)    "Affects thyroid" also   Adhesive [Tape] Other (See Comments)    Must have paper tape only   Ibuprofen Swelling and Other (See Comments)    Face swells   Latex Dermatitis   Lipitor [Atorvastatin Calcium] Other (See Comments)    Body aches   Lisinopril Other (See Comments)    PER DR. Sherene Sires (not recalled by patient)   Other Nausea Only and Other (See Comments)    Collards (gas, too)   Codeine Rash   Tramadol Palpitations    Family History  Problem Relation Age of Onset  Heart disease Mother 11   Rectal cancer Mother    Diabetes Mother    High Cholesterol Mother    Hypertension Mother    Stroke Mother    Diabetes Father 31   Hypertension Father    Breast cancer Sister    High Cholesterol Sister    Hypertension Sister    Breast cancer Sister    Heart disease Brother    High Cholesterol Brother    Hypertension Brother    Colon cancer Maternal Grandmother    Asthma Daughter    Asthma Daughter    Asthma Son    Stomach cancer Neg Hx    Esophageal cancer Neg Hx    Pancreatic cancer Neg Hx     Prior to Admission medications   Medication Sig Start Date End Date Taking? Authorizing Provider  acetaminophen (TYLENOL) 325 MG tablet Take 2 tablets (650 mg total) by mouth every 4 (four) hours as needed for headache or mild pain. 11/07/22   Orpah Cobb, MD  albuterol (PROVENTIL) (2.5 MG/3ML) 0.083% nebulizer solution Take 3 mLs (2.5 mg total) by nebulization every 6 (six) hours as needed for wheezing or shortness of breath. 11/08/22   Medina-Vargas, Monina C, NP  amLODipine (NORVASC) 2.5 MG tablet Take 2.5 mg by mouth daily. 10/27/22   [provider]  aspirin EC 81 MG tablet Take 1 tablet (81 mg total) by mouth daily. Swallow whole. 11/08/22   Orpah Cobb, MD  Blood Glucose Monitoring Suppl (FREESTYLE LITE) w/Device KIT 1 each by Does not apply route daily. Dx:E11.9 Patient not taking: Reported on 11/03/2022 11/04/20   Ngetich, [name] C, NP  Blood Pressure Monitoring (BLOOD PRESSURE MONITOR/WRIST) KIT 1 Units by Does not apply route daily at 6 (six) AM. Patient not taking: Reported on 11/03/2022 05/26/20   Ngetich, [name] C, NP  dapagliflozin propanediol (FARXIGA) 5 MG TABS tablet Take 1 tablet (5 mg total) by mouth daily before breakfast. 11/08/22   Orpah Cobb, MD  glucose blood (FREESTYLE LITE) test strip Use as instructed Patient not taking: Reported on 11/03/2022 04/07/22   Medina-Vargas, Monina C, NP  insulin aspart (NOVOLOG) 100 UNIT/ML injection Inject 0-15 Units into the skin 3 (three) times daily with meals. 11/07/22   Orpah Cobb, MD  Lancets (FREESTYLE) lancets Use to check blood sugar once daily. Dx: E11.9 Patient not taking: Reported on 11/03/2022 04/07/22   Medina-Vargas, Monina C, NP  levothyroxine (SYNTHROID) 50 MCG tablet Take 50 mcg by mouth daily. 08/13/22   [provider]  menthol-cetylpyridinium (CEPACOL) 3 MG lozenge Take 1 lozenge (3 mg total) by mouth as needed for sore throat. 11/07/22   Orpah Cobb, MD  methocarbamol (ROBAXIN) 500 MG tablet TAKE 1 TABLET(500 MG) BY MOUTH THREE TIMES DAILY AS NEEDED FOR MUSCLE SPASMS Patient taking differently: Take 500 mg by mouth 3 (three) times daily between meals as needed for muscle spasms. 10/28/22   Adonis Huguenin, NP  metoprolol succinate (TOPROL-XL) 50 MG 24 hr tablet Take  1 tablet (50 mg total) by mouth daily. 11/02/22   Medina-Vargas, Monina C, NP  nitroGLYCERIN (NITROSTAT) 0.4 MG SL tablet Place 1 tablet (0.4 mg total) under the tongue as needed. 04/04/22   Sarina Ill, DO  omeprazole (PRILOSEC) 40 MG capsule Take 40 mg by mouth 2 (two) times daily. 08/13/22   [provider]  potassium chloride (KLOR-CON M) 10 MEQ tablet Take 1 tablet (10 mEq total) by mouth daily. 11/08/22   Orpah Cobb, MD  rosuvastatin (CRESTOR)  40 MG tablet Take 1 tablet (40 mg total) by mouth daily. Patient not taking: Reported on 11/03/2022 04/04/22   Sarina Ill, DO  sucralfate (CARAFATE) 1 GM/10ML suspension Take 1 g by mouth. Daughter stated patient is taking this med but unsure of how she is taking it Patient not taking: Reported on 11/03/2022    [provider]  torsemide (DEMADEX) 20 MG tablet Take 1 tablet (20 mg total) by mouth every Monday, Wednesday, and Friday. 11/09/22   Orpah Cobb, MD  TRADJENTA 5 MG TABS tablet Take 5 mg by mouth daily. Patient not taking: Reported on 11/03/2022 08/15/22   [provider]  valproic acid (DEPAKENE) 250 MG capsule Take 1 capsule (250 mg total) by mouth 2 (two) times daily. Patient not taking: Reported on 11/03/2022 11/02/22   Gillis Santa, NP    Physical Exam: Vitals:   11/16/22 1555 11/16/22 1627 11/16/22 1628 11/16/22 2235  BP: 110/70  121/71   Pulse: 84 (!) 107 (!) 107   Resp: 18 (!) 32 (!) 32   Temp: 97.8 F (36.6 C) 98.3 F (36.8 C) 98.3 F (36.8 C) 98.2 F (36.8 C)  TempSrc: Oral Oral Oral Oral  SpO2: 99% 100% 100%   Weight:      Height:       Physical Exam: General: No acute distress, well developed, well nourished HEENT: Normocephalic, atraumatic, PERRL Cardiovascular: Normal rate and rhythm. Distal pulses faint but symmetric. Pulmonary: Tachypneic, Rales and areas of diminished breath sounds Gastrointestinal: Nondistended abdomen, soft, non-tender Musculoskeletal:Normal  ROM, no pitting edema Lymphadenopathy: No cervical LAD. Skin: Skin is warm and dry. Neuro: No focal deficits noted,  Psychiatric: He is talkative but confabulates wild stories.  He says her daughter is trying to steal and another daughter is "not right in the head".  She denies being short of breath even as she is visibly tachypneic with audible gurgling sounds.  She says she needs time to grieve and gets teary.  She says this is because her dad died 3 years ago. She says her trachea is broken.     Data Reviewed: {Tip this will not be part of the note when signed- Document your independent interpretation of telemetry tracing, EKG, lab, Radiology test or any other diagnostic tests. Add any new diagnostic test ordered today. (Optional):[id number]} Results for orders placed or performed during the hospital encounter of 11/16/22 (from the past 24 hour(s))  Valproic acid level     Status: Abnormal   Collection Time: 11/16/22  5:01 PM  Result Value Ref Range   Valproic Acid Lvl <10 (L) 50.0 - 100.0 ug/mL  Comprehensive metabolic panel     Status: Abnormal   Collection Time: 11/16/22  5:01 PM  Result Value Ref Range   Sodium 128 (L) 135 - 145 mmol/L   Potassium 2.4 (LL) 3.5 - 5.1 mmol/L   Chloride 96 (L) 98 - 111 mmol/L   CO2 21 (L) 22 - 32 mmol/L   Glucose, Bld 152 (H) 70 - 99 mg/dL   BUN 14 8 - 23 mg/dL   Creatinine, Ser 1.61 (H) 0.44 - 1.00 mg/dL   Calcium 8.0 (L) 8.9 - 10.3 mg/dL   Total Protein 6.6 6.5 - 8.1 g/dL   Albumin 2.2 (L) 3.5 - 5.0 g/dL   AST 15 15 - 41 U/L   ALT 15 0 - 44 U/L   Alkaline Phosphatase 73 38 - 126 U/L   Total Bilirubin 1.7 (H) 0.3 - 1.2 mg/dL  GFR, Estimated 45 (L) >60 mL/min   Anion gap 11 5 - 15  Troponin I (High Sensitivity)     Status: None   Collection Time: 11/16/22  5:01 PM  Result Value Ref Range   Troponin I (High Sensitivity) 17 <18 ng/L  CBC with Differential     Status: Abnormal   Collection Time: 11/16/22  5:01 PM  Result Value Ref Range   WBC  6.8 4.0 - 10.5 K/uL   RBC 3.13 (L) 3.87 - 5.11 MIL/uL   Hemoglobin 9.9 (L) 12.0 - 15.0 g/dL   HCT 96.0 (L) 45.4 - 09.8 %   MCV 95.2 80.0 - 100.0 fL   MCH 31.6 26.0 - 34.0 pg   MCHC 33.2 30.0 - 36.0 g/dL   RDW 11.9 (H) 14.7 - 82.9 %   Platelets 312 150 - 400 K/uL   nRBC 0.0 0.0 - 0.2 %   Neutrophils Relative % 75 %   Neutro Abs 5.1 1.7 - 7.7 K/uL   Lymphocytes Relative 19 %   Lymphs Abs 1.3 0.7 - 4.0 K/uL   Monocytes Relative 5 %   Monocytes Absolute 0.4 0.1 - 1.0 K/uL   Eosinophils Relative 0 %   Eosinophils Absolute 0.0 0.0 - 0.5 K/uL   Basophils Relative 0 %   Basophils Absolute 0.0 0.0 - 0.1 K/uL   Immature Granulocytes 1 %   Abs Immature Granulocytes 0.06 0.00 - 0.07 K/uL  Brain natriuretic peptide     Status: Abnormal   Collection Time: 11/16/22  5:01 PM  Result Value Ref Range   B Natriuretic Peptide 638.9 (H) 0.0 - 100.0 pg/mL  Troponin I (High Sensitivity)     Status: Abnormal   Collection Time: 11/16/22  7:19 PM  Result Value Ref Range   Troponin I (High Sensitivity) 37 (H) <18 ng/L   *Note: Due to a large number of results and/or encounters for the requested time period, some results have not been displayed. A complete set of results can be found in Results Review.    Assessment and Plan: Acute CHF exacerbation/ Recent NSTEMI/medication noncompliance- -Monitor on telemetry as patient allows - CXR reads possible pna but symptoms and time course more c/w CHF -The patient was seen by cardiology in the emergency department Work-up reveals elevated BNP and clinical evidence of heart failure  Patient has been placed on intravenous diuretics. Strict input and output monitoring Supplemental oxygen for bouts of hypoxia Monitoring renal function and electrolytes with serial chemistires Daily weights Consider repeat Echocardiogram    2. Acute Encephalopathy - Schizophrenia unmedicated +/- dementia   -psychiatry consultation.   -The patient is currently under IVC  3.  Hypokalemia Replace potassium aggressively Magnesium level pending  4. CKD -  Strict intake and output monitoring Creatinine near baseline Minimizing nephrotoxic agents as much as possible Serial chemistries to monitor renal function and electrolytes     Advance Care Planning:   Code Status: Full Code  Patient does not have capacity to make the decision at this time so she will be full code.  Consults: She has already been seen by cardiology.  Psychiatry consultation needed  Family Communication: I did speak to the patient's oldest daughter after I interviewed the patient.  Severity of Illness: The appropriate patient status for this patient is INPATIENT. Inpatient status is judged to be reasonable and necessary in order to provide the required intensity of service to ensure the patient's safety. The patient's presenting symptoms, physical exam findings, and initial radiographic  and laboratory data in the context of their chronic comorbidities is felt to place them at high risk for further clinical deterioration. Furthermore, it is not anticipated that the patient will be medically stable for discharge from the hospital within 2 midnights of admission.   * I certify that at the point of admission it is my clinical judgment that the patient will require inpatient hospital care spanning beyond 2 midnights from the point of admission due to high intensity of service, high risk for further deterioration and high frequency of surveillance required.*  Author: Buena Irish, MD 11/16/2022 11:01 PM  For on call review [email or web address].

## 2022-11-16 NOTE — ED Notes (Signed)
Patient refusing to be hooked up to monitor and have temperature taken at this time.

## 2022-11-16 NOTE — ED Notes (Signed)
Patient cursing at daughter at this time. Patient stating she no longer wants to be here.

## 2022-11-16 NOTE — ED Triage Notes (Signed)
Pt bib [facility] from home with complaints of shob and congestion. Pt was just admitted and says that her daughter is freezing her out and its making her sick. Pt states she did not take any of her medication today.  Per EMS pt was found at 86% on room air and was placed on the nonrebreather. On arrival, EMS stated pt needs bipap but pt is not on nonrebreather on arrival. Pt is speaking complete sentences.

## 2022-11-16 NOTE — ED Notes (Signed)
[facility] paperwork in process 

## 2022-11-16 NOTE — ED Provider Triage Note (Signed)
Emergency Medicine Provider Triage Evaluation Note  [name] Long , a 72 y.o. female  was evaluated in triage.  Pt complains of concerns for chest pain onset this morning after she ate a egg.  Patient denies shortness of breath.  Does not wear oxygen at home. EMS placed patient initially on nonrebreather and then CPAP due to concerns for hypoxia with O2 in the high 70-low 80s.  On my evaluation of patient after patient has talked a great deal of the conversation, O2 sats at 100% and maintaining.  Per patient chart review: Patient was admitted to the hospital for NSTEMI on 11/02/2022.  Patient also has a history of schizoaffective disorder, CKD stage III, dementia, TMJ.  Review of Systems  Positive:  Negative:   Physical Exam  BP 121/71 (BP Location: Right Arm)   Pulse (!) 107   Temp 98.3 F (36.8 C) (Oral)   Resp (!) 32   Ht 5\' 6"  (1.676 m)   Wt 86.2 kg   SpO2 100%   BMI 30.67 kg/m  Gen:   Awake, no distress   Resp:  Normal effort  MSK:   Moves extremities without difficulty  Other:  Able to speak in clear complete sentences without difficulty.  No appreciable pitting edema noted to bilateral lower extremities.  No tenderness to palpation noted to chest wall.  Medical Decision Making  Medically screening exam initiated at 5:37 PM.  Appropriate orders placed.  Soundra Pilon was informed that the remainder of the evaluation will be completed by another provider, this initial triage assessment does not replace that evaluation, and the importance of remaining in the ED until their evaluation is complete.  3:30 PM - oxygen sats at 100% at bedside. Discussed with RN that patient is in need of a room immediately. RN aware and working on room placement.    Bailey Kolbe A, PA-C 11/16/22 1737

## 2022-11-16 NOTE — ED Notes (Signed)
Patient given coffee. 

## 2022-11-16 NOTE — ED Notes (Addendum)
Patient refusing to be hooked up to monitor and get in a gown at this time.

## 2022-11-16 NOTE — ED Notes (Signed)
Pt again has taken off all leads, pulse ox, bp cuff and is off the bed and sitting in the chair. She refuses to let us put equipment back on her.

## 2022-11-16 NOTE — ED Notes (Signed)
Pt continues to take off EKG leads, pulse ox and BP. This is the third time she has taken them off. Pt continues to yell with family member.

## 2022-11-16 NOTE — ED Notes (Signed)
Ivc paperwork complete copies attached to clipboard in orange zone

## 2022-11-16 NOTE — ED Provider Notes (Signed)
[place] EMERGENCY DEPARTMENT AT [facility] Provider Note   CSN: [id number] Arrival date & time: 11/16/22  1555     History  Chief Complaint  Patient presents with   Shortness of Breath    [name] Long is a 72 y.o. female.   Shortness of Breath Patient reportedly brought in for shortness of breath and congestion.  Recent admission for CHF.  Also had chest pain.  Refused cath.  Also had confusion.  Back home and reportedly not taking medicines.  History of COPD.  EMS reportedly had sats of 86% at home.  Patient states she is coughing.  States more sputum. History also comes from patient's daughter.  Has not been taking her medicines and history of schizophrenia and likely dementia.    Past Medical History:  Diagnosis Date   Abscessed tooth 02/09/2020   Acute on chronic congestive heart failure (HCC)    Adenomatous colon polyp    Arthritis    "real bad; all over" (07/12/2017)   Asthma    Atrial fibrillation (HCC)    BIPOLAR DISORDER 08/31/2006   Qualifier: Diagnosis of  By: Irving Burton MD, Clifton Custard     CHRONIC KIDNEY DISEASE STAGE II (MILD) 09/14/2009   Annotation: eGFR 64 Qualifier: Diagnosis of  By: Louanne Belton MD, Rolm Gala     Chronic lower back pain    Congestive heart failure (CHF) (HCC)    COPD (chronic obstructive pulmonary disease) (HCC) 09/23/2010   Diagnosed at [facility] in 2008 (Dr. Maple Hudson)    DM (diabetes mellitus) type II controlled with renal manifestation (HCC) 08/31/2006   Qualifier: Diagnosis of  By: Irving Burton MD, Clifton Custard     Dyspnea    "all my life; since 6th grade" (07/12/2017)   Fatty liver    HYPERCHOLESTEROLEMIA 08/31/2006   Intolerance to Lipitor OK on Crestor but medicaid no longer covering     HYPERTENSION, BENIGN SYSTEMIC 08/31/2006   Qualifier: Diagnosis of  By: Irving Burton MD, Aaron     Hypokalemia    HYPOTHYROIDISM, UNSPECIFIED 08/31/2006   Qualifier: Diagnosis of  By: Irving Burton MD, Clifton Custard     Leg swelling 03/14/2018   Pneumonia    "3 times"  (07/12/2017)   Pulmonary nodule    Renal cyst    Schizophrenia (HCC)    Scoliosis    Stomach problems    Thyroid disorder     Home Medications Prior to Admission medications   Medication Sig Start Date End Date Taking? Authorizing Provider  acetaminophen (TYLENOL) 325 MG tablet Take 2 tablets (650 mg total) by mouth every 4 (four) hours as needed for headache or mild pain. 11/07/22   Orpah Cobb, MD  albuterol (PROVENTIL) (2.5 MG/3ML) 0.083% nebulizer solution Take 3 mLs (2.5 mg total) by nebulization every 6 (six) hours as needed for wheezing or shortness of breath. 11/08/22   Medina-Vargas, Monina C, NP  amLODipine (NORVASC) 2.5 MG tablet Take 2.5 mg by mouth daily. 10/27/22   [provider]  aspirin EC 81 MG tablet Take 1 tablet (81 mg total) by mouth daily. Swallow whole. 11/08/22   Orpah Cobb, MD  Blood Glucose Monitoring Suppl (FREESTYLE LITE) w/Device KIT 1 each by Does not apply route daily. Dx:E11.9 Patient not taking: Reported on 11/03/2022 11/04/20   Ngetich, [name] C, NP  Blood Pressure Monitoring (BLOOD PRESSURE MONITOR/WRIST) KIT 1 Units by Does not apply route daily at 6 (six) AM. Patient not taking: Reported on 11/03/2022 05/26/20   Ngetich, Donalee Citrin, NP  dapagliflozin propanediol (FARXIGA) 5  MG TABS tablet Take 1 tablet (5 mg total) by mouth daily before breakfast. 11/08/22   Orpah Cobb, MD  glucose blood (FREESTYLE LITE) test strip Use as instructed Patient not taking: Reported on 11/03/2022 04/07/22   Medina-Vargas, Monina C, NP  insulin aspart (NOVOLOG) 100 UNIT/ML injection Inject 0-15 Units into the skin 3 (three) times daily with meals. 11/07/22   Orpah Cobb, MD  Lancets (FREESTYLE) lancets Use to check blood sugar once daily. Dx: E11.9 Patient not taking: Reported on 11/03/2022 04/07/22   Medina-Vargas, Monina C, NP  levothyroxine (SYNTHROID) 50 MCG tablet Take 50 mcg by mouth daily. 08/13/22   [provider]  menthol-cetylpyridinium (CEPACOL) 3 MG lozenge Take 1  lozenge (3 mg total) by mouth as needed for sore throat. 11/07/22   Orpah Cobb, MD  methocarbamol (ROBAXIN) 500 MG tablet TAKE 1 TABLET(500 MG) BY MOUTH THREE TIMES DAILY AS NEEDED FOR MUSCLE SPASMS Patient taking differently: Take 500 mg by mouth 3 (three) times daily between meals as needed for muscle spasms. 10/28/22   Adonis Huguenin, NP  metoprolol succinate (TOPROL-XL) 50 MG 24 hr tablet Take 1 tablet (50 mg total) by mouth daily. 11/02/22   Medina-Vargas, Monina C, NP  nitroGLYCERIN (NITROSTAT) 0.4 MG SL tablet Place 1 tablet (0.4 mg total) under the tongue as needed. 04/04/22   Sarina Ill, DO  omeprazole (PRILOSEC) 40 MG capsule Take 40 mg by mouth 2 (two) times daily. 08/13/22   [provider]  potassium chloride (KLOR-CON M) 10 MEQ tablet Take 1 tablet (10 mEq total) by mouth daily. 11/08/22   Orpah Cobb, MD  rosuvastatin (CRESTOR) 40 MG tablet Take 1 tablet (40 mg total) by mouth daily. Patient not taking: Reported on 11/03/2022 04/04/22   Sarina Ill, DO  sucralfate (CARAFATE) 1 GM/10ML suspension Take 1 g by mouth. Daughter stated patient is taking this med but unsure of how she is taking it Patient not taking: Reported on 11/03/2022    [provider]  torsemide (DEMADEX) 20 MG tablet Take 1 tablet (20 mg total) by mouth every Monday, Wednesday, and Friday. 11/09/22   Orpah Cobb, MD  TRADJENTA 5 MG TABS tablet Take 5 mg by mouth daily. Patient not taking: Reported on 11/03/2022 08/15/22   [provider]  valproic acid (DEPAKENE) 250 MG capsule Take 1 capsule (250 mg total) by mouth 2 (two) times daily. Patient not taking: Reported on 11/03/2022 11/02/22   Medina-Vargas, Monina C, NP      Allergies    Citrus, Fish allergy, Shellfish allergy, Adhesive [tape], Ibuprofen, Latex, Lipitor [atorvastatin calcium], Lisinopril, Other, Codeine, and Tramadol    Review of Systems   Review of Systems  Respiratory:  Positive for shortness of breath.      Physical Exam Updated Vital Signs BP 121/71 (BP Location: Right Arm)   Pulse (!) 107   Temp 98.2 F (36.8 C) (Oral)   Resp (!) 32   Ht 5\' 6"  (1.676 m)   Wt 86.2 kg   SpO2 100%   BMI 30.67 kg/m  Physical Exam Vitals and nursing note reviewed.  Cardiovascular:     Rate and Rhythm: Normal rate and regular rhythm.  Pulmonary:     Comments: Mildly harsh breath sounds without focal rales or rhonchi. Chest:     Chest wall: No tenderness.  Musculoskeletal:     Comments: Slight edema bilateral lower extremities.  Neurological:     Mental Status: She is alert.     ED  Results / Procedures / Treatments   Labs (all labs ordered are listed, but only abnormal results are displayed) Labs Reviewed  VALPROIC ACID LEVEL - Abnormal; Notable for the following components:      Result Value   Valproic Acid Lvl <10 (*)    All other components within normal limits  COMPREHENSIVE METABOLIC PANEL - Abnormal; Notable for the following components:   Sodium 128 (*)    Potassium 2.4 (*)    Chloride 96 (*)    CO2 21 (*)    Glucose, Bld 152 (*)    Creatinine, Ser 1.28 (*)    Calcium 8.0 (*)    Albumin 2.2 (*)    Total Bilirubin 1.7 (*)    GFR, Estimated 45 (*)    All other components within normal limits  CBC WITH DIFFERENTIAL/PLATELET - Abnormal; Notable for the following components:   RBC 3.13 (*)    Hemoglobin 9.9 (*)    HCT 29.8 (*)    RDW 15.9 (*)    All other components within normal limits  BRAIN NATRIURETIC PEPTIDE - Abnormal; Notable for the following components:   B Natriuretic Peptide 638.9 (*)    All other components within normal limits  TROPONIN I (HIGH SENSITIVITY) - Abnormal; Notable for the following components:   Troponin I (High Sensitivity) 37 (*)    All other components within normal limits  URINALYSIS, ROUTINE W REFLEX MICROSCOPIC  CBC  CREATININE, SERUM  COMPREHENSIVE METABOLIC PANEL  BRAIN NATRIURETIC PEPTIDE  TSH  MAGNESIUM  MAGNESIUM  TROPONIN I (HIGH  SENSITIVITY)    EKG None  Radiology DG Chest 2 View  Result Date: 11/16/2022 CLINICAL DATA:  Shortness of breath and congestion. EXAM: CHEST - 2 VIEW COMPARISON:  Chest radiographs 11/02/2022, 03/07/2022, 09/02/2021 FINDINGS: Cardiac silhouette is again moderately to markedly enlarged. Mediastinal contours are within normal limits. Mild calcification within the aortic arch. There isincreased left basilar/retrocardiac opacity compared to the prior 11/02/2021 frontal and lateral chest radiographs. This may represent left basilar atelectasis versus pneumonia. It is difficult to exclude a small left pleural effusion. Mild right basilar linear subsegmental atelectasis is similar to prior. No pneumothorax is seen. No acute skeletal abnormality. IMPRESSION: Increased left basilar/retrocardiac opacity is similar to the frontal radiograph 11/02/2022 but new compared to the prior 11/02/2021 frontal and lateral chest radiographs. This may represent left basilar atelectasis versus pneumonia. It is difficult to exclude a small left pleural effusion. Electronically Signed   By: Neita Garnet M.D.   On: 11/16/2022 16:58    Procedures Procedures    Medications Ordered in ED Medications  potassium chloride 10 mEq in 100 mL IVPB (10 mEq Intravenous New Bag/Given 11/16/22 2147)  enoxaparin (LOVENOX) injection 40 mg (40 mg Subcutaneous Given 11/16/22 2233)  acetaminophen (TYLENOL) tablet 500 mg (has no administration in time range)    Or  acetaminophen (TYLENOL) suppository 650 mg (has no administration in time range)  senna-docusate (Senokot-S) tablet 1 tablet (has no administration in time range)  ondansetron (ZOFRAN) tablet 4 mg (has no administration in time range)    Or  ondansetron (ZOFRAN) injection 4 mg (has no administration in time range)    ED Course/ Medical Decision Making/ A&P                             Medical Decision Making Amount and/or Complexity of Data Reviewed Labs:  ordered. Radiology: ordered.  Risk Prescription drug management. Decision regarding hospitalization.  Patient presented with reported hypoxia.  Noncompliance with medication.  Cough.  Differential diagnosis includes pneumonia, CHF, COPD.  Also has been more confused.  Has history of schizophrenia along with likely dementia although official testing has not been done.  X-ray showed potential pneumonia.  Discussed with [name] who came to evaluate patient.  Potential medicine admission if needed. Social influence her health includes schizophrenia and dementia.  X-ray shows potential pneumonia.  More prominent on x-rays prior.  Has had cough with green sputum.  Reportedly had sats in the 80s although improved now.  BNP is also elevated.  Also hypokalemia and Depakote level is 0.  This would go along with her noncompliance.  After discussion with patient.  Further discussion with daughter.  Daughter states that the patient was attempting to call her other daughter but did not have a phone in her hand.  Patient does appear somewhat confused.  With noncompliance and schizophrenia I think she is a risk to herself and IVC paperwork was done.  However do not think she is necessarily medically cleared at this time and will discuss with hospitalist for admission.          Final Clinical Impression(s) / ED Diagnoses Final diagnoses:  Hypokalemia  Schizophrenia, unspecified type (HCC)  Community acquired pneumonia, unspecified laterality    Rx / DC Orders ED Discharge Orders     None         Benjiman Core, MD 11/16/22 2242

## 2022-11-17 ENCOUNTER — Encounter (HOSPITAL_COMMUNITY): Payer: Self-pay | Admitting: Internal Medicine

## 2022-11-17 LAB — URINALYSIS, ROUTINE W REFLEX MICROSCOPIC
Bilirubin Urine: NEGATIVE
Glucose, UA: NEGATIVE mg/dL
Hgb urine dipstick: NEGATIVE
Ketones, ur: NEGATIVE mg/dL
Leukocytes,Ua: NEGATIVE
Nitrite: NEGATIVE
Protein, ur: NEGATIVE mg/dL
Specific Gravity, Urine: 1.009 (ref 1.005–1.030)
pH: 5 (ref 5.0–8.0)

## 2022-11-17 LAB — FOLATE: Folate: 8.4 ng/mL (ref >5.9)

## 2022-11-17 LAB — TSH: TSH: 0.198 u[IU]/mL — ABNORMAL LOW (ref 0.350–4.500)

## 2022-11-17 LAB — COMPREHENSIVE METABOLIC PANEL
ALT: 14 U/L (ref 0–44)
AST: 13 U/L — ABNORMAL LOW (ref 15–41)
Albumin: 2.1 g/dL — ABNORMAL LOW (ref 3.5–5.0)
Alkaline Phosphatase: 66 U/L (ref 38–126)
Anion gap: 6 (ref 5–15)
BUN: 14 mg/dL (ref 8–23)
CO2: 24 mmol/L (ref 22–32)
Calcium: 8.2 mg/dL — ABNORMAL LOW (ref 8.9–10.3)
Chloride: 98 mmol/L (ref 98–111)
Creatinine, Ser: 1.34 mg/dL — ABNORMAL HIGH (ref 0.44–1.00)
GFR, Estimated: 42 mL/min — ABNORMAL LOW (ref >60)
Glucose, Bld: 86 mg/dL (ref 70–99)
Potassium: 3.4 mmol/L — ABNORMAL LOW (ref 3.5–5.1)
Sodium: 128 mmol/L — ABNORMAL LOW (ref 135–145)
Total Bilirubin: 1.8 mg/dL — ABNORMAL HIGH (ref 0.3–1.2)
Total Protein: 6.4 g/dL — ABNORMAL LOW (ref 6.5–8.1)

## 2022-11-17 LAB — BRAIN NATRIURETIC PEPTIDE: B Natriuretic Peptide: 1132 pg/mL — ABNORMAL HIGH (ref 0.0–100.0)

## 2022-11-17 LAB — T4, FREE: Free T4: 0.98 ng/dL (ref 0.61–1.12)

## 2022-11-17 LAB — MAGNESIUM: Magnesium: 1.7 mg/dL (ref 1.7–2.4)

## 2022-11-17 MED ORDER — POTASSIUM CHLORIDE CRYS ER 10 MEQ PO TBCR
10.0000 meq | EXTENDED_RELEASE_TABLET | Freq: Two times a day (BID) | ORAL | Status: DC
  Administered 2022-11-17 – 2022-11-20 (×6): 10 meq via ORAL
  Filled 2022-11-17 (×6): qty 1

## 2022-11-17 MED ORDER — LINAGLIPTIN 5 MG PO TABS
5.0000 mg | ORAL_TABLET | Freq: Every day | ORAL | Status: DC
  Administered 2022-11-18 – 2022-11-20 (×3): 5 mg via ORAL
  Filled 2022-11-17 (×3): qty 1

## 2022-11-17 MED ORDER — PANTOPRAZOLE SODIUM 40 MG PO TBEC
80.0000 mg | DELAYED_RELEASE_TABLET | Freq: Every day | ORAL | Status: DC
  Administered 2022-11-18 – 2022-11-20 (×3): 80 mg via ORAL
  Filled 2022-11-17 (×3): qty 2

## 2022-11-17 MED ORDER — TAB-A-VITE/IRON PO TABS
1.0000 | ORAL_TABLET | Freq: Every day | ORAL | Status: DC
  Administered 2022-11-17 – 2022-11-18 (×2): 1 via ORAL
  Filled 2022-11-17 (×4): qty 1

## 2022-11-17 MED ORDER — AMLODIPINE BESYLATE 2.5 MG PO TABS
2.5000 mg | ORAL_TABLET | Freq: Every day | ORAL | Status: DC
  Administered 2022-11-18: 2.5 mg via ORAL
  Filled 2022-11-17: qty 1

## 2022-11-17 MED ORDER — LORAZEPAM 2 MG/ML IJ SOLN
1.0000 mg | Freq: Once | INTRAMUSCULAR | Status: AC
  Administered 2022-11-17: 1 mg via INTRAMUSCULAR
  Filled 2022-11-17: qty 1

## 2022-11-17 MED ORDER — POTASSIUM CHLORIDE CRYS ER 20 MEQ PO TBCR: 40.0000 meq | EXTENDED_RELEASE_TABLET | Freq: Once | ORAL | Status: DC

## 2022-11-17 MED ORDER — HALOPERIDOL LACTATE 5 MG/ML IJ SOLN
1.0000 mg | Freq: Once | INTRAMUSCULAR | Status: AC
  Administered 2022-11-17: 1 mg via INTRAMUSCULAR
  Filled 2022-11-17: qty 1

## 2022-11-17 MED ORDER — METOPROLOL SUCCINATE ER 50 MG PO TB24
50.0000 mg | ORAL_TABLET | Freq: Every day | ORAL | Status: DC
  Administered 2022-11-18 – 2022-11-20 (×3): 50 mg via ORAL
  Filled 2022-11-17 (×3): qty 1

## 2022-11-17 MED ORDER — METHOCARBAMOL 500 MG PO TABS
500.0000 mg | ORAL_TABLET | Freq: Three times a day (TID) | ORAL | Status: DC | PRN
  Administered 2022-11-19 (×2): 500 mg via ORAL
  Filled 2022-11-17 (×2): qty 1

## 2022-11-17 MED ORDER — NITROGLYCERIN 0.4 MG SL SUBL: 0.4000 mg | SUBLINGUAL_TABLET | SUBLINGUAL | Status: DC | PRN

## 2022-11-17 MED ORDER — PALIPERIDONE ER 3 MG PO TB24
3.0000 mg | ORAL_TABLET | Freq: Every day | ORAL | Status: DC
  Filled 2022-11-17 (×2): qty 1

## 2022-11-17 MED ORDER — VALPROIC ACID 250 MG PO CAPS
250.0000 mg | ORAL_CAPSULE | Freq: Two times a day (BID) | ORAL | Status: DC
  Filled 2022-11-17 (×2): qty 1

## 2022-11-17 MED ORDER — ASPIRIN 81 MG PO TBEC
81.0000 mg | DELAYED_RELEASE_TABLET | Freq: Every day | ORAL | Status: DC
  Administered 2022-11-18 – 2022-11-20 (×3): 81 mg via ORAL
  Filled 2022-11-17 (×3): qty 1

## 2022-11-17 MED ORDER — ROSUVASTATIN CALCIUM 20 MG PO TABS
40.0000 mg | ORAL_TABLET | Freq: Every day | ORAL | Status: DC
  Administered 2022-11-18 – 2022-11-20 (×3): 40 mg via ORAL
  Filled 2022-11-17 (×3): qty 2

## 2022-11-17 MED ORDER — POTASSIUM CHLORIDE CRYS ER 10 MEQ PO TBCR: 10.0000 meq | EXTENDED_RELEASE_TABLET | Freq: Every day | ORAL | Status: DC

## 2022-11-17 MED ORDER — MAGNESIUM SULFATE 2 GM/50ML IV SOLN
2.0000 g | Freq: Once | INTRAVENOUS | Status: AC
  Administered 2022-11-17: 2 g via INTRAVENOUS
  Filled 2022-11-17: qty 50

## 2022-11-17 MED ORDER — MENTHOL 3 MG MT LOZG: 1.0000 | LOZENGE | OROMUCOSAL | Status: DC | PRN

## 2022-11-17 MED ORDER — DIVALPROEX SODIUM 125 MG PO CSDR
250.0000 mg | DELAYED_RELEASE_CAPSULE | Freq: Two times a day (BID) | ORAL | Status: DC
  Administered 2022-11-18 – 2022-11-20 (×5): 250 mg via ORAL
  Filled 2022-11-17 (×6): qty 2

## 2022-11-17 MED ORDER — LEVOTHYROXINE SODIUM 50 MCG PO TABS
50.0000 ug | ORAL_TABLET | Freq: Every day | ORAL | Status: DC
  Administered 2022-11-18: 50 ug via ORAL
  Filled 2022-11-17: qty 1

## 2022-11-17 NOTE — ED Notes (Signed)
Ivc paperwork attached to clipboard in green zone.

## 2022-11-17 NOTE — ED Notes (Signed)
IVC docs tubed to 3E tube #[id number]

## 2022-11-17 NOTE — ED Notes (Addendum)
Patient ambulated to bathroom. Put toilet paper in sample so unable to obtain.

## 2022-11-17 NOTE — Hospital Course (Addendum)
56 yof w/ schizophrenia, dementia,CKD, hypertension, COPD, hypothyroidism, and NSTEMI with acute pulmonary edema who was discharged just 9 days ago,brought in for "gurgling" and having difficulty breathing.  She was admitted inpatient psych facility in [place] and discharge few days ago. In the ED denies shortness of breath chest pain and said she is here because she got a little choked after eating an egg. As per ED, she is confabulating wild stories and says she has not no heart trouble.She has not been taking her schizophrenia medication nor her heart medication.  Initial vitals with mild tachycardia, not hypoxic, BP stable.  Labs with significant hypokalemia 2.4 hyponatremia 128 creatinine 1.2, BNP 638> 1132, troponin 17>37, anemia hemoglobin 9.9 UA unremarkable.  Chest x-ray showed increased left basilar/retrocardiac opacity similar to 1 from 5/1 but new from 5/2 Psychiatry consulted,and admitted for acute CHF exacerbation.She is under involuntary commitment.   CHF exacerbation acute on chronic diastolic: bnp up, and short of breath, in the setting of medical noncompliance.  Recent echo from 11/03/2022 showed EF 50-55%, no RWMA.  Placed on IV Lasix 20 mg twice daily.  She is followed with Dr. Gweneth Fritter on torsemide 20 mg Monday Wednesday Friday, schedule, Toprol 50 mg  Recent NSTEMI Elevated troponin with mild positive in the setting of CHF exacerbation suspect demand ischemia.  Patient refused cardiac cath on recent admission.  Resume her aspirin, Crestor 40 mg  Hypokalemia: Potassium improved to 3.4.  CKD 3b: b/l creat 1.2-1.5,stable,Creatinine at 1.3 from 1.2  Hyponatremia: Sodium at 128, monitor.  Acute psychosis History of schizophrenia +/- Dementia Under IVC: Resume home valproic.  Seen by neurology and psychiatry on last admission-  Hypertension normal on amlodipine 2.5 mg.  Normocytic anemia hemoglobin 9.9 g, b/l hb 9.5-11 gm.  Elevated TB: monitor

## 2022-11-17 NOTE — ED Notes (Signed)
..ED TO INPATIENT HANDOFF REPORT  ED Nurse Name and Phone #: [phone]  S Name/Age/Gender [name] Long 72 y.o. female Room/Bed: 006C/006C  Code Status   Code Status: Full Code  Home/SNF/Other Home Patient oriented to: self, place, time, and situation Is this baseline? Yes   Triage Complete: Triage complete  Chief Complaint CHF (congestive heart failure) (HCC) [I50.9]  Triage Note Pt bib [facility] from home with complaints of shob and congestion. Pt was just admitted and says that her daughter is freezing her out and its making her sick. Pt states she did not take any of her medication today.  Per EMS pt was found at 86% on room air and was placed on the nonrebreather. On arrival, EMS stated pt needs bipap but pt is not on nonrebreather on arrival. Pt is speaking complete sentences.   Allergies Allergies  Allergen Reactions   Citrus Anaphylaxis and Itching   Fish Allergy Anaphylaxis and Other (See Comments)   Shellfish Allergy Shortness Of Breath and Other (See Comments)    "Affects thyroid" also   Adhesive [Tape] Other (See Comments)    Must have paper tape only   Ibuprofen Swelling and Other (See Comments)    Face swells   Latex Dermatitis   Lipitor [Atorvastatin Calcium] Other (See Comments)    Body aches   Lisinopril Other (See Comments)    PER DR. Sherene Sires (not recalled by patient)   Other Nausea Only and Other (See Comments)    Collards (gas, too)   Codeine Rash   Tramadol Palpitations    Level of Care/Admitting Diagnosis ED Disposition     ED Disposition  Admit   Condition  --   Comment  Hospital Area: [facility] [[id number]]  Level of Care: Progressive [102]  Admit to Progressive based on following criteria: CARDIOVASCULAR & THORACIC of moderate stability with acute coronary syndrome symptoms/low risk myocardial infarction/hypertensive urgency/arrhythmias/heart failure potentially compromising stability and stable post cardiovascular intervention  patients.  May admit patient to [facility] or [facility] if equivalent level of care is available:: Yes  Covid Evaluation: Asymptomatic - no recent exposure (last 10 days) testing not required  Diagnosis: CHF (congestive heart failure) [facility]) [[id number]]  Admitting Physician: Buena Irish [[id number]]  Attending Physician: Buena Irish [phone]  Certification:: I certify this patient will need inpatient services for at least 2 midnights  Estimated Length of Stay: 4          B Medical/Surgery History Past Medical History:  Diagnosis Date   Abscessed tooth 02/09/2020   Acute on chronic congestive heart failure (HCC)    Adenomatous colon polyp    Arthritis    "real bad; all over" (07/12/2017)   Asthma    Atrial fibrillation (HCC)    BIPOLAR DISORDER 08/31/2006   Qualifier: Diagnosis of  By: Irving Burton MD, Clifton Custard     CHRONIC KIDNEY DISEASE STAGE II (MILD) 09/14/2009   Annotation: eGFR 64 Qualifier: Diagnosis of  By: Louanne Belton MD, Erik     Chronic lower back pain    Congestive heart failure (CHF) (HCC)    COPD (chronic obstructive pulmonary disease) (HCC) 09/23/2010   Diagnosed at [facility] in 2008 (Dr. Maple Hudson)    DM (diabetes mellitus) type II controlled with renal manifestation (HCC) 08/31/2006   Qualifier: Diagnosis of  By: Irving Burton MD, Clifton Custard     Dyspnea    "all my life; since 6th grade" (07/12/2017)   Fatty liver    HYPERCHOLESTEROLEMIA 08/31/2006   Intolerance to Lipitor  OK on Crestor but medicaid no longer covering     HYPERTENSION, BENIGN SYSTEMIC 08/31/2006   Qualifier: Diagnosis of  By: Irving Burton MD, Aaron     Hypokalemia    HYPOTHYROIDISM, UNSPECIFIED 08/31/2006   Qualifier: Diagnosis of  By: Irving Burton MD, Clifton Custard     Leg swelling 03/14/2018   Pneumonia    "3 times" (07/12/2017)   Pulmonary nodule    Renal cyst    Schizophrenia (HCC)    Scoliosis    Stomach problems    Thyroid disorder    Past Surgical History:  Procedure Laterality Date   CESAREAN SECTION     FOOT FRACTURE  SURGERY Right    "steel plate in it"   FOOT SURGERY     " born w/dislocated foot"   FRACTURE SURGERY     KNEE ARTHROSCOPY Right    TOE SURGERY Bilateral    "both pinky toes"   TONSILLECTOMY AND ADENOIDECTOMY       A IV Location/Drains/Wounds Patient Lines/Drains/Airways Status     Active Line/Drains/Airways     Name Placement date Placement time Site Days   Peripheral IV 11/17/22 20 G Anterior;Proximal;Right Forearm 11/17/22  1208  Forearm  less than 1            Intake/Output Last 24 hours  Intake/Output Summary (Last 24 hours) at 11/17/2022 1302 Last data filed at 11/17/2022 0636 Gross per 24 hour  Intake 303.33 ml  Output 300 ml  Net 3.33 ml    Labs/Imaging Results for orders placed or performed during the hospital encounter of 11/16/22 (from the past 48 hour(s))  Valproic acid level     Status: Abnormal   Collection Time: 11/16/22  5:01 PM  Result Value Ref Range   Valproic Acid Lvl <10 (L) 50.0 - 100.0 ug/mL    Comment: RESULT CONFIRMED BY MANUAL DILUTION Performed at [facility], [address], [place], [place] [address]   Comprehensive metabolic panel     Status: Abnormal   Collection Time: 11/16/22  5:01 PM  Result Value Ref Range   Sodium 128 (L) 135 - 145 mmol/L   Potassium 2.4 (LL) 3.5 - 5.1 mmol/L    Comment: CRITICAL RESULT CALLED TO, READ BACK BY AND VERIFIED WITH L KATHY RN 11/16/2022 1838 BNUNNERY   Chloride 96 (L) 98 - 111 mmol/L   CO2 21 (L) 22 - 32 mmol/L   Glucose, Bld 152 (H) 70 - 99 mg/dL    Comment: Glucose reference range applies only to samples taken after fasting for at least 8 hours.   BUN 14 8 - 23 mg/dL   Creatinine, Ser 6.04 (H) 0.44 - 1.00 mg/dL   Calcium 8.0 (L) 8.9 - 10.3 mg/dL   Total Protein 6.6 6.5 - 8.1 g/dL   Albumin 2.2 (L) 3.5 - 5.0 g/dL   AST 15 15 - 41 U/L   ALT 15 0 - 44 U/L   Alkaline Phosphatase 73 38 - 126 U/L   Total Bilirubin 1.7 (H) 0.3 - 1.2 mg/dL   GFR, Estimated 45 (L) >60 mL/min    Comment:  (NOTE) Calculated using the CKD-EPI Creatinine Equation (2021)    Anion gap 11 5 - 15    Comment: Performed at [facility], [address], [place], [place] [address]  Troponin I (High Sensitivity)     Status: None   Collection Time: 11/16/22  5:01 PM  Result Value Ref Range   Troponin I (High Sensitivity) 17 <18 ng/L  Comment: (NOTE) Elevated high sensitivity troponin I (hsTnI) values and significant  changes across serial measurements may suggest ACS but many other  chronic and acute conditions are known to elevate hsTnI results.  Refer to the "Links" section for chest pain algorithms and additional  guidance. Performed at [facility], [address], [place], [place] [address]   CBC with Differential     Status: Abnormal   Collection Time: 11/16/22  5:01 PM  Result Value Ref Range   WBC 6.8 4.0 - 10.5 K/uL   RBC 3.13 (L) 3.87 - 5.11 MIL/uL   Hemoglobin 9.9 (L) 12.0 - 15.0 g/dL   HCT 54.0 (L) 98.1 - 19.1 %   MCV 95.2 80.0 - 100.0 fL   MCH 31.6 26.0 - 34.0 pg   MCHC 33.2 30.0 - 36.0 g/dL   RDW 47.8 (H) 29.5 - 62.1 %   Platelets 312 150 - 400 K/uL   nRBC 0.0 0.0 - 0.2 %   Neutrophils Relative % 75 %   Neutro Abs 5.1 1.7 - 7.7 K/uL   Lymphocytes Relative 19 %   Lymphs Abs 1.3 0.7 - 4.0 K/uL   Monocytes Relative 5 %   Monocytes Absolute 0.4 0.1 - 1.0 K/uL   Eosinophils Relative 0 %   Eosinophils Absolute 0.0 0.0 - 0.5 K/uL   Basophils Relative 0 %   Basophils Absolute 0.0 0.0 - 0.1 K/uL   Immature Granulocytes 1 %   Abs Immature Granulocytes 0.06 0.00 - 0.07 K/uL    Comment: Performed at [facility], [address], [place], [place] [address]  Brain natriuretic peptide     Status: Abnormal   Collection Time: 11/16/22  5:01 PM  Result Value Ref Range   B Natriuretic Peptide 638.9 (H) 0.0 - 100.0 pg/mL    Comment: Performed at [facility], [address], [place], [place] [address]  Troponin I (High Sensitivity)     Status: Abnormal    Collection Time: 11/16/22  7:19 PM  Result Value Ref Range   Troponin I (High Sensitivity) 37 (H) <18 ng/L    Comment: RESULT CALLED TO, READ BACK BY AND VERIFIED WITH Arlyss Queen, RN. 2056 11/16/22. LPAIT (NOTE) Elevated high sensitivity troponin I (hsTnI) values and significant  changes across serial measurements may suggest ACS but many other  chronic and acute conditions are known to elevate hsTnI results.  Refer to the "Links" section for chest pain algorithms and additional  guidance. Performed at [facility], [address] [place], [place] [address]   Comprehensive metabolic panel     Status: Abnormal   Collection Time: 11/17/22  6:37 AM  Result Value Ref Range   Sodium 128 (L) 135 - 145 mmol/L   Potassium 3.4 (L) 3.5 - 5.1 mmol/L   Chloride 98 98 - 111 mmol/L   CO2 24 22 - 32 mmol/L   Glucose, Bld 86 70 - 99 mg/dL    Comment: Glucose reference range applies only to samples taken after fasting for at least 8 hours.   BUN 14 8 - 23 mg/dL   Creatinine, Ser 8.41 (H) 0.44 - 1.00 mg/dL   Calcium 8.2 (L) 8.9 - 10.3 mg/dL   Total Protein 6.4 (L) 6.5 - 8.1 g/dL   Albumin 2.1 (L) 3.5 - 5.0 g/dL   AST 13 (L) 15 - 41 U/L   ALT 14 0 - 44 U/L   Alkaline Phosphatase 66 38 - 126 U/L  Total Bilirubin 1.8 (H) 0.3 - 1.2 mg/dL   GFR, Estimated 42 (L) >60 mL/min    Comment: (NOTE) Calculated using the CKD-EPI Creatinine Equation (2021)    Anion gap 6 5 - 15    Comment: Performed at [facility], [address], [place], [place] [address]  Brain natriuretic peptide     Status: Abnormal   Collection Time: 11/17/22  6:37 AM  Result Value Ref Range   B Natriuretic Peptide 1,132.0 (H) 0.0 - 100.0 pg/mL    Comment: Performed at [facility], [address], [place], [place] [address]  TSH     Status: Abnormal   Collection Time: 11/17/22  6:37 AM  Result Value Ref Range   TSH 0.198 (L) 0.350 - 4.500 uIU/mL    Comment: Performed by a 3rd Generation assay with a functional  sensitivity of <=0.01 uIU/mL. Performed at [facility], [address], [place], [place] [address]   Magnesium     Status: None   Collection Time: 11/17/22  6:37 AM  Result Value Ref Range   Magnesium 1.7 1.7 - 2.4 mg/dL    Comment: Performed at [facility], [address] [place], [place] [address]  Urinalysis, Routine w reflex microscopic -Urine, Unspecified Source     Status: Abnormal   Collection Time: 11/17/22  7:15 AM  Result Value Ref Range   Color, Urine STRAW (A) YELLOW   APPearance CLEAR CLEAR   Specific Gravity, Urine 1.009 1.005 - 1.030   pH 5.0 5.0 - 8.0   Glucose, UA NEGATIVE NEGATIVE mg/dL   Hgb urine dipstick NEGATIVE NEGATIVE   Bilirubin Urine NEGATIVE NEGATIVE   Ketones, ur NEGATIVE NEGATIVE mg/dL   Protein, ur NEGATIVE NEGATIVE mg/dL   Nitrite NEGATIVE NEGATIVE   Leukocytes,Ua NEGATIVE NEGATIVE    Comment: Performed at [facility], [address], [place], [place] [address]   *Note: Due to a large number of results and/or encounters for the requested time period, some results have not been displayed. A complete set of results can be found in Results Review.   DG Chest 2 View  Result Date: 11/16/2022 CLINICAL DATA:  Shortness of breath and congestion. EXAM: CHEST - 2 VIEW COMPARISON:  Chest radiographs 11/02/2022, 03/07/2022, 09/02/2021 FINDINGS: Cardiac silhouette is again moderately to markedly enlarged. Mediastinal contours are within normal limits. Mild calcification within the aortic arch. There isincreased left basilar/retrocardiac opacity compared to the prior 11/02/2021 frontal and lateral chest radiographs. This may represent left basilar atelectasis versus pneumonia. It is difficult to exclude a small left pleural effusion. Mild right basilar linear subsegmental atelectasis is similar to prior. No pneumothorax is seen. No acute skeletal abnormality. IMPRESSION: Increased left basilar/retrocardiac opacity is similar to the frontal  radiograph 11/02/2022 but new compared to the prior 11/02/2021 frontal and lateral chest radiographs. This may represent left basilar atelectasis versus pneumonia. It is difficult to exclude a small left pleural effusion. Electronically Signed   By: Neita Garnet M.D.   On: 11/16/2022 16:58    Pending Labs Unresulted Labs (From admission, onward)     Start     Ordered   11/23/22 0500  Creatinine, serum  (enoxaparin (LOVENOX)    CrCl >/= 30 ml/min)  Weekly,   R     Comments: while on enoxaparin therapy    11/16/22 2126   11/16/22 2128  Magnesium  Add-on,   AD        11/16/22 2127  11/16/22 2122  CBC  (enoxaparin (LOVENOX)    CrCl >/= 30 ml/min)  Once,   STAT       Comments: Baseline for enoxaparin therapy IF NOT ALREADY DRAWN.  Notify MD if PLT < 100 K.    11/16/22 2126   11/16/22 2122  Creatinine, serum  (enoxaparin (LOVENOX)    CrCl >/= 30 ml/min)  Once,   STAT       Comments: Baseline for enoxaparin therapy IF NOT ALREADY DRAWN.    11/16/22 2126            Vitals/Pain Today's Vitals   11/17/22 0700 11/17/22 1115 11/17/22 1130 11/17/22 1145  BP: (!) 128/97 121/61 112/88   Pulse: 87 83 83 89  Resp: (!) 23     Temp:      TempSrc:      SpO2: 97% 100% 100% 100%  Weight:      Height:      PainSc:        Isolation Precautions No active isolations  Medications Medications  enoxaparin (LOVENOX) injection 40 mg (40 mg Subcutaneous Given 11/16/22 2233)  acetaminophen (TYLENOL) tablet 500 mg (500 mg Oral Given 11/16/22 2323)    Or  acetaminophen (TYLENOL) suppository 650 mg ( Rectal See Alternative 11/16/22 2323)  senna-docusate (Senokot-S) tablet 1 tablet (has no administration in time range)  ondansetron (ZOFRAN) tablet 4 mg (has no administration in time range)    Or  ondansetron (ZOFRAN) injection 4 mg (has no administration in time range)  furosemide (LASIX) injection 20 mg (20 mg Intravenous Given 11/17/22 1234)  amLODipine (NORVASC) tablet 2.5 mg (2.5 mg Oral Not  Given 11/17/22 1100)  aspirin EC tablet 81 mg (81 mg Oral Patient Refused/Not Given 11/17/22 1100)  levothyroxine (SYNTHROID) tablet 50 mcg (50 mcg Oral Not Given 11/17/22 1000)  menthol-cetylpyridinium (CEPACOL) lozenge 3 mg (has no administration in time range)  methocarbamol (ROBAXIN) tablet 500 mg (has no administration in time range)  metoprolol succinate (TOPROL-XL) 24 hr tablet 50 mg (50 mg Oral Patient Refused/Not Given 11/17/22 1100)  nitroGLYCERIN (NITROSTAT) SL tablet 0.4 mg (has no administration in time range)  pantoprazole (PROTONIX) EC tablet 80 mg (80 mg Oral Patient Refused/Not Given 11/17/22 1100)  rosuvastatin (CRESTOR) tablet 40 mg (40 mg Oral Patient Refused/Not Given 11/17/22 1100)  linagliptin (TRADJENTA) tablet 5 mg (5 mg Oral Patient Refused/Not Given 11/17/22 1100)  valproic acid (DEPAKENE) 250 MG capsule 250 mg (250 mg Oral Patient Refused/Not Given 11/17/22 1100)  potassium chloride (KLOR-CON M) CR tablet 10 mEq (10 mEq Oral Patient Refused/Not Given 11/17/22 1100)  magnesium sulfate IVPB 2 g 50 mL (2 g Intravenous New Bag/Given 11/17/22 1239)  potassium chloride 10 mEq in 100 mL IVPB (0 mEq Intravenous Stopped 11/17/22 0021)  furosemide (LASIX) injection 20 mg (20 mg Intravenous Given 11/16/22 2325)  potassium chloride (KLOR-CON) packet 40 mEq (40 mEq Oral Given 11/16/22 2322)  haloperidol lactate (HALDOL) injection 1 mg (1 mg Intramuscular Given 11/17/22 1106)  LORazepam (ATIVAN) injection 1 mg (1 mg Intramuscular Given 11/17/22 1105)    Mobility walks     Focused Assessments Neuro Assessment Handoff:  Swallow screen pass? Yes          Neuro Assessment:   Neuro Checks:      Has TPA been given? No If patient is a Neuro Trauma and patient is going to OR before floor call report to 4N Charge nurse: [phone] or [phone]   R Recommendations: See Admitting Provider Note  Report given to:   Additional Notes: IVC due to non-compliant with meds, CHF  exacerabation. RA O2Sats 99%. Medicated with 1mg  of Haldol and 1mg  of ativan earlier. Pt was combative and tried to leave. Pt calm and cooperative now. 2og RAC. Pt refused all oral meds.

## 2022-11-17 NOTE — ED Notes (Signed)
Patient seen ambulating to bathroom. Unable to obtain amount.

## 2022-11-17 NOTE — Progress Notes (Signed)
Heart Failure Navigator Progress Note  Assessed for Heart & Vascular TOC clinic readiness.  Patient does not meet criteria due to EF 50-55% with history of dementia. .   Navigator will sign off at this time.    Rhae Hammock, BSN, Scientist, clinical (histocompatibility and immunogenetics) Only

## 2022-11-17 NOTE — Consult Note (Addendum)
[facility] Psychiatry New Face-to-Face Psychiatric Evaluation   Service Date: Nov 17, 2022 LOS:  LOS: 1 day  Reason for consult: Schizophrenia Consulted by: Orpah Cobb, MD  Assessment  [name] Long is a 72 y.o. female with PMH of schizoaffective disorder-bipolar type and has a past medical history of hypothyroidism, T2DM, HTN, COPD, CKD stage III, and chronic diastolic CHF, who was admitted for CHF exacerbation.   Unspecified dementia vs Schizoaffective d/o - bipolar type  Suspect dementia over scza due to psychotic symptom onset in patient's 60's. Also that there is a strong family history of dementia, involving multiple family members on paternal side with dementia. Patient is delusional, no paranoia.  Did not appear internally preoccupied, per children patient does experience auditory and visual hallucinations. Unclear baseline, her current presentation of delusions, intermittent hallucinations, poor insight and send medical and psychiatric condition may be her baseline.  However will trial antipsychotic per below to see if patient is able to gain insight, but unsure if antipsychotic will give meaningful improvement, since there is probably underlying dementia as well. Continuing VPA at this time for mood lability/agitation for now while trialling antipsychotic. Ultimately, patient may not need Depakote as a mood stabilizer and an antipsychotic concurrently, especially with patient's poor medication adherence. Family and legal guardian wants LAI for patient, however given patient's CrCl, will needed another discussion of risk, benefits, side effects.  Patient receives outpatient psychiatric services at [facility], but family would like options for alternatives.   Although patient is delusional, she does not meet criteria for inpatient psychiatric hospitalization, as she has not harm to self or others.   Recommend palliative care consult for goals of care discussion with patient and  family.  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 This decision is based on my review of the chart including patient's history and current presentation, interview of the patient, mental status examination, and consideration of suicide risk including evaluating suicidal ideation, plan, intent, suicidal or self-harm behaviors, risk factors, and protective factors. This judgment is based on our ability to directly address suicide risk, implement suicide prevention strategies and develop a safety plan while the patient is in the clinical setting. Please contact our team if there is a concern that risk level has changed.   ## Medications:  -- CHANGED PO depakote 250 mg BID to sprinkles  -- STARTED invega 3 mg qHS -- STARTED multivitamin  ## Medical Decision Making Capacity:  Has legal guardian  ## Further Work-up:  -- Free T4, folate level  -- most recent EKG on 5/15 had QtC of 434 -- Pertinent labwork reviewed earlier this admission includes:  TSH 0.198 Depakote level <10 Estimated Creatinine Clearance: 42.6 mL/min (A) (by C-G formula based on SCr of 1.34 mg/dL (H)).  Prior to admission: Lipid panel and A1c obtained within the last 4 months. Head CT: No acute intracranial processes B12 level 367  ## Disposition:  -- Per primary team  Resources for outpatient psychiatry, geropsychiatry, neuropsychiatry, and neurology has been provided in patient's discharge instruction.   ##  Behavioral / Environmental:  -- Delirium  ##Legal Status IVC  (5/15)  Thank you for this consult request. Recommendations have been communicated to the primary team.  We will follow at this time.   [name], DO PGY-2  NEW history  Relevant Aspects of Hospital Course:  Admitted on 11/16/2022 for CHF exacerbation. 5/16: psych consult, started invega 3 mg, vpa sprinkles  Patient Report:  Patient was initially seen sitting up in hospital bed, sleepy, where she would fall asleep after 3 or 4 questions in the setting of recently receiving Haldol and Ativan. She was oriented to self, location, situation, time.  She was not engaged with evaluation. Legal guardian present at bedside.  Patient stated that she is here because of her knee problems.  Stated that she remembers choking on an egg at home, calling EMS, then arriving here in the ED.  Stated that she will call present [place] on me, and get [facility] shut down.  Stated that every time she comes to the hospital, she loses money out of her bank account.  Patient declined to give further information, stated that "every time I open my mouth, I end up in a psych hospital."  Today, patient denied SI/HI.  She does not appear internally preoccupied, however does make several delusional statements.  Review of Systems  Constitutional:  Positive for malaise/fatigue and weight loss.  Respiratory:  Positive for cough and shortness of breath.      Psych ROS:  Depression: Denied, saying that being in the hospital makes her sad.  Denied suicidal ideation.  Declined to give further information.  Daughter/legal guardian stated that patient eats about once a day, and sleeps only a few hours a night.  Anxiety: Stated she is anxious because she is in the hospital.  Declined to give further information.  Psychosis: Delusions about contacting [place] and shutting down [place].  No paranoia or hyperreligious delusions.  Her children, patient experiences frank AVH, noncommanding.   Collateral information:  Gayna Bergan (daughter) in person. Other daughter and son was on the phone during conversation. Children expressed their concerns were patient's delusions and hallucinations, however they are now aware that an antipsychotic may help, however not guarantee.  Daughter/legal guardian consented to trial of antipsychotic after discussing risk, benefits, side effects. Family requested resources for outpatient services, which was provided, see above.  Stated that their goal is for mom to have enough insight so that she can take her medications for her heart failure and other medical conditions.  Stated that they would tolerate hallucinations and delusions if patients were to take her heart failure medications.  Discussed with family progression of schizophrenia/schizoaffective disorder and dementia, which are both terminal disease.  Recommended discussing goals of care for patient.   Psychiatric History:  Dx: Schizoaffective (dx at in 2022 at [facility]) Guardianship: Elonda Husky (daughter) Inpatient psych: ~8-9. Last time [facility] admission first week of April and Dc'd 25th June 2024 for delusions, aggressive behavior and hallucinations  Suicide: no Homicide: no Psychiatrist: [facility] Rx: depakote, zyprexa, risperdal, invega, lorazepam  Oct 2023 was the last time patient received LAI at [facility] Non-adherent to medication  Social Housing: Living with daughter, grandchild Children: adult kids x3 - son x1, daughters x2 Guns/weapons: Denied Nicotine: cigarettes - 1/2 PPD on and off for years EtOH: None currently Illicit substances: Denied   Family History:  Dad, uncles with dementia The patient's family history includes Asthma in her daughter, daughter, and son; Breast cancer in her sister and sister;  Colon cancer in her maternal grandmother; Diabetes in her mother; Diabetes (age of onset: 63) in her father; Heart disease in her brother; Heart disease (age of onset: 49) in her mother; High Cholesterol in her  brother, mother, and sister; Hypertension in her brother, father, mother, and sister; Rectal cancer in her mother; Stroke in her mother.  Medical History: Past Medical History:  Diagnosis Date   Abscessed tooth 02/09/2020   Acute on chronic congestive heart failure (HCC)    Adenomatous colon polyp    Arthritis    "real bad; all over" (07/12/2017)   Asthma    Atrial fibrillation (HCC)    BIPOLAR DISORDER 08/31/2006   Qualifier: Diagnosis of  By: Irving Burton MD, Clifton Custard     CHRONIC KIDNEY DISEASE STAGE II (MILD) 09/14/2009   Annotation: eGFR 64 Qualifier: Diagnosis of  By: Louanne Belton MD, Rolm Gala     Chronic lower back pain    Congestive heart failure (CHF) (HCC)    COPD (chronic obstructive pulmonary disease) (HCC) 09/23/2010   Diagnosed at [facility] in 2008 (Dr. Maple Hudson)    DM (diabetes mellitus) type II controlled with renal manifestation (HCC) 08/31/2006   Qualifier: Diagnosis of  By: Irving Burton MD, Clifton Custard     Dyspnea    "all my life; since 6th grade" (07/12/2017)   Fatty liver    HYPERCHOLESTEROLEMIA 08/31/2006   Intolerance to Lipitor OK on Crestor but medicaid no longer covering     HYPERTENSION, BENIGN SYSTEMIC 08/31/2006   Qualifier: Diagnosis of  By: Irving Burton MD, Clifton Custard     Hypokalemia    HYPOTHYROIDISM, UNSPECIFIED 08/31/2006   Qualifier: Diagnosis of  By: Irving Burton MD, Clifton Custard     Leg swelling 03/14/2018   Pneumonia    "3 times" (07/12/2017)   Pulmonary nodule    Renal cyst    Schizophrenia (HCC)    Scoliosis    Stomach problems    Thyroid disorder    Surgical History: Past Surgical History:  Procedure Laterality Date   CESAREAN SECTION     FOOT FRACTURE SURGERY Right    "steel plate in it"   FOOT SURGERY     " born w/dislocated foot"   FRACTURE SURGERY     KNEE ARTHROSCOPY Right    TOE SURGERY Bilateral    "both pinky toes"   TONSILLECTOMY AND ADENOIDECTOMY     Medications:   Current Facility-Administered Medications:    acetaminophen (TYLENOL) tablet 500 mg, 500 mg, Oral, Q6H  PRN, 500 mg at 11/16/22 2323 **OR** acetaminophen (TYLENOL) suppository 650 mg, 650 mg, Rectal, Q6H PRN, Buena Irish, MD   amLODipine (NORVASC) tablet 2.5 mg, 2.5 mg, Oral, Daily, Orpah Cobb, MD   aspirin EC tablet 81 mg, 81 mg, Oral, Daily, Orpah Cobb, MD   divalproex (DEPAKOTE SPRINKLE) capsule 250 mg, 250 mg, Oral, Q12H, [name], DO   enoxaparin (LOVENOX) injection 40 mg, 40 mg, Subcutaneous, Q24H, Buena Irish, MD, 40 mg at 11/16/22 2233   furosemide (LASIX) injection 20 mg, 20 mg, Intravenous, BID, Buena Irish, MD, 20 mg at 11/17/22 1714   levothyroxine (SYNTHROID) tablet 50 mcg, 50 mcg, Oral, Daily, Orpah Cobb, MD   linagliptin (TRADJENTA) tablet 5 mg, 5 mg, Oral, Daily, Orpah Cobb, MD   menthol-cetylpyridinium (CEPACOL) lozenge 3 mg, 1 lozenge, Oral, PRN, Orpah Cobb, MD   methocarbamol (ROBAXIN) tablet 500 mg, 500 mg, Oral, TID BM PRN, Orpah Cobb, MD   metoprolol succinate (TOPROL-XL) 24 hr tablet 50 mg, 50 mg, Oral, Daily, Orpah Cobb, MD  multivitamins with iron tablet 1 tablet, 1 tablet, Oral, QHS, [name], DO   nitroGLYCERIN (NITROSTAT) SL tablet 0.4 mg, 0.4 mg, Sublingual, Q5 min PRN, Orpah Cobb, MD   ondansetron (ZOFRAN) tablet 4 mg, 4 mg, Oral, Q6H PRN **OR** ondansetron (ZOFRAN) injection 4 mg, 4 mg, Intravenous, Q6H PRN, Buena Irish, MD   paliperidone (INVEGA) 24 hr tablet 3 mg, 3 mg, Oral, QHS, [name], DO   pantoprazole (PROTONIX) EC tablet 80 mg, 80 mg, Oral, Daily, Orpah Cobb, MD   potassium chloride (KLOR-CON M) CR tablet 10 mEq, 10 mEq, Oral, BID, Orpah Cobb, MD   rosuvastatin (CRESTOR) tablet 40 mg, 40 mg, Oral, Daily, Orpah Cobb, MD   senna-docusate (Senokot-S) tablet 1 tablet, 1 tablet, Oral, QHS PRN, Buena Irish, MD  Allergies: Allergies  Allergen Reactions   Citrus Anaphylaxis and Itching   Fish Allergy Anaphylaxis and Other (See Comments)   Shellfish Allergy Shortness Of Breath  and Other (See Comments)    "Affects thyroid" also   Adhesive [Tape] Other (See Comments)    Must have paper tape only   Ibuprofen Swelling and Other (See Comments)    Face swells   Latex Dermatitis   Lipitor [Atorvastatin Calcium] Other (See Comments)    Body aches   Lisinopril Other (See Comments)    PER DR. Sherene Sires (not recalled by patient)   Other Nausea Only and Other (See Comments)    Collards (gas, too)   Codeine Rash   Tramadol Palpitations     Objective  Vital signs:  Temp:  [98.1 F (36.7 C)-98.6 F (37 C)] 98.6 F (37 C) (05/16 1355) Pulse Rate:  [71-100] 71 (05/16 1315) Resp:  [17-24] 18 (05/16 1315) BP: (95-128)/(61-97) 103/67 (05/16 1315) SpO2:  [97 %-100 %] 100 % (05/16 1315)  Psychiatric Specialty Exam:  Presentation  General Appearance:  Appropriate for Environment; Casual; Fairly Groomed  Eye Contact: Minimal  Speech: Clear and Coherent; Normal Rate  Speech Volume: Normal  Handedness: Right   Mood and Affect  Mood: Anxious; Dysphoric  Affect: Appropriate; Congruent; Restricted  Thought Process  Thought Processes: Coherent; Goal Directed; Linear  Descriptions of Associations:Intact  Orientation:Full (Time, Place and Person)  Thought Content:Rumination; Illogical; Perseveration  History of Schizophrenia/Schizoaffective disorder:Yes  Duration of Psychotic Symptoms:Greater than six months  Hallucinations:Hallucinations: None  Ideas of Reference:None  Suicidal Thoughts:Suicidal Thoughts: No  Homicidal Thoughts:Homicidal Thoughts: No   Sensorium  Memory: Recent Poor  Judgment: Impaired  Insight: None  Executive Functions  Concentration: Good  Attention Span: Good  Recall: Poor  Fund of Knowledge: Good  Language: Good  Psychomotor Activity  Psychomotor Activity:Psychomotor Activity: Flacid   Assets  Assets: Communication Skills; Desire for Improvement; Resilience; Social Support; Housing  Sleep   Sleep:Sleep: Poor   Physical Exam: Physical Exam Vitals and nursing note reviewed.  Constitutional:      General: She is not in acute distress.    Appearance: She is ill-appearing. She is not diaphoretic.     Comments: Oversedated, falling asleep during eval multiple times   HENT:     Head: Normocephalic.     Nose: No congestion.  Neurological:     Mental Status: She is oriented to person, place, and time.    Blood pressure 103/67, pulse 71, temperature 98.6 F (37 C), temperature source Oral, resp. rate 18, height 5\' 6"  (1.676 m), weight 86.2 kg, SpO2 100 %. Body mass index is 30.67 kg/m.

## 2022-11-17 NOTE — Progress Notes (Signed)
Ref: Medina-Vargas, Margit Banda, NP   Subjective:  Patient refusing further treatment. Awaiting disposition to psych unit v/s home. Hypokalemia improving with supplement. Troponin I levels are near normal. BNP was elevated.  Chest x-ray without pulmonary edema. Mild basilar atelectasis is seen.  Objective:  Vital Signs in the last 24 hours: Temp:  [97.8 F (36.6 C)-98.3 F (36.8 C)] 98.3 F (36.8 C) (05/16 0654) Pulse Rate:  [84-107] 87 (05/16 0700) Resp:  [18-32] 23 (05/16 0700) BP: (95-128)/(67-97) 128/97 (05/16 0700) SpO2:  [97 %-100 %] 97 % (05/16 0700) Weight:  [86.2 kg] 86.2 kg (05/15 1553)  Physical Exam: BP Readings from Last 1 Encounters:  11/17/22 (!) 128/97     Wt Readings from Last 1 Encounters:  11/16/22 86.2 kg    Weight change:  Body mass index is 30.67 kg/m. HEENT: [place]/AT, Eyes-Brown, Conjunctiva-Pale pink, Sclera-Non-icteric Neck: No JVD, No bruit, Trachea midline. Lungs:  Clearing, Bilateral. Cardiac:  Regular rhythm, normal S1 and S2, no S3. II/VI systolic murmur. Abdomen:  Soft, non-tender. BS present. Extremities:  No edema present. No cyanosis. No clubbing. CNS: AxOx3, Cranial nerves grossly intact, moves all 4 extremities.  Skin: Warm and dry.   Intake/Output from previous day: 05/15 0701 - 05/16 0700 In: 303.3 [IV Piggyback:303.3] Out: 300 [Urine:300]    Lab Results: BMET    Component Value Date/Time   NA 128 (L) 11/17/2022 0637   NA 128 (L) 11/16/2022 1701   NA 137 11/07/2022 0218   NA 137 03/23/2020 1433   NA 141 03/12/2020 1529   NA 139 01/11/2019 1419   K 3.4 (L) 11/17/2022 0637   K 2.4 (LL) 11/16/2022 1701   K 4.1 11/07/2022 0218   CL 98 11/17/2022 0637   CL 96 (L) 11/16/2022 1701   CL 96 (L) 11/07/2022 0218   CO2 24 11/17/2022 0637   CO2 21 (L) 11/16/2022 1701   CO2 33 (H) 11/07/2022 0218   GLUCOSE 86 11/17/2022 0637   GLUCOSE 152 (H) 11/16/2022 1701   GLUCOSE 168 (H) 11/07/2022 0218   BUN 14 11/17/2022 0637   BUN 14  11/16/2022 1701   BUN 18 11/07/2022 0218   BUN 25 03/23/2020 1433   BUN 16 03/12/2020 1529   BUN 17 01/11/2019 1419   CREATININE 1.34 (H) 11/17/2022 0637   CREATININE 1.28 (H) 11/16/2022 1701   CREATININE 1.26 (H) 11/07/2022 0218   CREATININE 1.20 (H) 11/02/2022 1524   CREATININE 1.52 (H) 08/02/2022 1449   CREATININE 1.35 (H) 06/03/2022 1528   CALCIUM 8.2 (L) 11/17/2022 0637   CALCIUM 8.0 (L) 11/16/2022 1701   CALCIUM 8.9 11/07/2022 0218   GFRNONAA 42 (L) 11/17/2022 0637   GFRNONAA 45 (L) 11/16/2022 1701   GFRNONAA 46 (L) 11/07/2022 0218   GFRNONAA 47 (L) 10/01/2020 1147   GFRNONAA 46 (L) 05/26/2020 1553   GFRNONAA 48 (L) 04/30/2020 1206   GFRAA 54 (L) 10/01/2020 1147   GFRAA 54 (L) 05/26/2020 1553   GFRAA 56 (L) 04/30/2020 1206   CBC    Component Value Date/Time   WBC 6.8 11/16/2022 1701   RBC 3.13 (L) 11/16/2022 1701   HGB 9.9 (L) 11/16/2022 1701   HCT 29.8 (L) 11/16/2022 1701   PLT 312 11/16/2022 1701   MCV 95.2 11/16/2022 1701   MCH 31.6 11/16/2022 1701   MCHC 33.2 11/16/2022 1701   RDW 15.9 (H) 11/16/2022 1701   LYMPHSABS 1.3 11/16/2022 1701   MONOABS 0.4 11/16/2022 1701   EOSABS 0.0 11/16/2022 1701  BASOSABS 0.0 11/16/2022 1701   HEPATIC Function Panel Recent Labs    04/07/22 1200 08/02/22 1449 11/04/22 0809 11/16/22 1701 11/17/22 0637  PROT 6.6   < > 6.5 6.6 6.4*  ALBUMIN  --    < > 2.6* 2.2* 2.1*  AST 9*   < > 21 15 13*  ALT 9   < > 25 15 14   ALKPHOS  --    < > 86 73 66  BILIDIR 0.0  --   --   --   --   IBILI 0.1*  --   --   --   --    < > = values in this interval not displayed.   HEMOGLOBIN A1C Lab Results  Component Value Date   MPG 192 08/02/2022   CARDIAC ENZYMES Lab Results  Component Value Date   CKTOTAL 251 (H) 03/07/2022   CKMB 1.6 04/02/2022   TROPONINI <0.03 12/26/2016   TROPONINI <0.03 12/25/2016   BNP No results for input(s): "PROBNP" in the last 8760 hours. TSH Recent Labs    04/07/22 1200 10/04/22 1800 11/02/22 1524   TSH 1.56 0.987 1.02   CHOLESTEROL Recent Labs    03/08/22 0014 04/07/22 1200 11/03/22 0546  CHOL 215* 147 121    Scheduled Meds:  amLODipine  2.5 mg Oral Daily   aspirin EC  81 mg Oral Daily   enoxaparin (LOVENOX) injection  40 mg Subcutaneous Q24H   furosemide  20 mg Intravenous BID   levothyroxine  50 mcg Oral Daily   linagliptin  5 mg Oral Daily   metoprolol succinate  50 mg Oral Daily   pantoprazole  80 mg Oral Daily   potassium chloride  10 mEq Oral BID   rosuvastatin  40 mg Oral Daily   valproic acid  250 mg Oral BID   Continuous Infusions: PRN Meds:.acetaminophen **OR** acetaminophen, menthol-cetylpyridinium, methocarbamol, nitroGLYCERIN, ondansetron **OR** ondansetron (ZOFRAN) IV, senna-docusate  Assessment/Plan: 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 Potassium and magnesium supplement. May transfer to psych unit by tomorrow.    LOS: 1 day   Time spent including chart review, lab review, examination, discussion with patient : 30 min   Orpah Cobb  MD  11/17/2022, 9:03 AM

## 2022-11-17 NOTE — ED Notes (Signed)
Pt ripped out her IV, pt refusing medical treatment stating "I'm going home the doctor said I could go home". Pt educated about IVC process, pt informed that she could not leave the hospital she is under involuntary commit. Pt continuously refusing care, pt will not keep on monitor cords. Pt getting irritated and aggressive with staff. Pt redirected and staff tried to deescelate situation. MD Ajay, notified @0928  of pt behavior, no new orders at this time. Safety sitter continued at bedside.

## 2022-11-17 NOTE — ED Notes (Signed)
Patient ambulated to bathroom.

## 2022-11-17 NOTE — Plan of Care (Signed)

## 2022-11-17 NOTE — ED Notes (Signed)
Pt belongings placed in pt belongings bag and valuable items giving to security. Daughter at bedside aware pt belongings are secured in locker and valuables are locked with security. Pt dressed out into purple scrubs.

## 2022-11-17 NOTE — Progress Notes (Signed)
Progress Note    It appears Dr Algie Coffer has made himself the attending on this patient and removed her from the hospitalist team.  We will sign off but please consult [place] if needed.   Author: Buena Irish, MD 11/17/2022 6:14 PM  For on call review [email or web address].

## 2022-11-17 NOTE — ED Notes (Signed)
Report received assumed care at this time.

## 2022-11-18 LAB — BASIC METABOLIC PANEL
Anion gap: 10 (ref 5–15)
BUN: 17 mg/dL (ref 8–23)
CO2: 24 mmol/L (ref 22–32)
Calcium: 8.6 mg/dL — ABNORMAL LOW (ref 8.9–10.3)
Chloride: 93 mmol/L — ABNORMAL LOW (ref 98–111)
Creatinine, Ser: 1.34 mg/dL — ABNORMAL HIGH (ref 0.44–1.00)
GFR, Estimated: 42 mL/min — ABNORMAL LOW (ref >60)
Glucose, Bld: 103 mg/dL — ABNORMAL HIGH (ref 70–99)
Potassium: 3.8 mmol/L (ref 3.5–5.1)
Sodium: 127 mmol/L — ABNORMAL LOW (ref 135–145)

## 2022-11-18 MED ORDER — LEVOTHYROXINE SODIUM 25 MCG PO TABS
25.0000 ug | ORAL_TABLET | Freq: Every day | ORAL | Status: DC
  Administered 2022-11-19 – 2022-11-20 (×2): 25 ug via ORAL
  Filled 2022-11-18 (×2): qty 1

## 2022-11-18 MED ORDER — RISPERIDONE 0.5 MG PO TBDP
0.5000 mg | ORAL_TABLET | Freq: Every day | ORAL | Status: DC
  Administered 2022-11-18: 0.5 mg via ORAL
  Filled 2022-11-18 (×3): qty 1

## 2022-11-18 MED ORDER — TORSEMIDE 20 MG PO TABS
20.0000 mg | ORAL_TABLET | Freq: Every day | ORAL | Status: DC
  Administered 2022-11-19 – 2022-11-20 (×2): 20 mg via ORAL
  Filled 2022-11-18 (×2): qty 1

## 2022-11-18 NOTE — Plan of Care (Signed)

## 2022-11-18 NOTE — Consult Note (Signed)
Ref: Medina-Vargas, Monina C, NP   Subjective:  Awake.  Verbally abusive but cooperates with physical exam. Hypokalemia improves. Appreciate psych recommendations.  Objective:  Vital Signs in the last 24 hours: Temp:  [97.7 F (36.5 C)-98.6 F (37 C)] 97.7 F (36.5 C) (05/17 0521) Pulse Rate:  [71-89] 80 (05/17 0521) Cardiac Rhythm: Normal sinus rhythm (05/17 0732) Resp:  [15-18] 15 (05/17 0521) BP: (100-121)/(57-88) 112/65 (05/17 0521) SpO2:  [97 %-100 %] 97 % (05/17 0521) Weight:  [62.7 kg] 62.7 kg (05/17 0509)  Physical Exam: BP Readings from Last 1 Encounters:  11/18/22 112/65     Wt Readings from Last 1 Encounters:  11/18/22 62.7 kg    Weight change: -23.5 kg Body mass index is 22.31 kg/m. HEENT: [place]/AT, Eyes-Brown, Conjunctiva-Pink, Sclera-Non-icteric Neck: No JVD, No bruit, Trachea midline. Lungs:  Clearing, Bilateral. Cardiac:  Regular rhythm, normal S1 and S2, no S3. II/VI systolic murmur. Abdomen:  Soft, non-tender. BS present. Extremities:  Trace lower leg edema present. No cyanosis. No clubbing. CNS: AxOx3, Cranial nerves grossly intact, moves all 4 extremities.  Skin: Warm and dry.   Intake/Output from previous day: 05/16 0701 - 05/17 0700 In: 1384 [P.O.:1384] Out: 1800 [Urine:1800]    Lab Results: BMET    Component Value Date/Time   NA 127 (L) 11/18/2022 0112   NA 128 (L) 11/17/2022 0637   NA 128 (L) 11/16/2022 1701   NA 137 03/23/2020 1433   NA 141 03/12/2020 1529   NA 139 01/11/2019 1419   K 3.8 11/18/2022 0112   K 3.4 (L) 11/17/2022 0637   K 2.4 (LL) 11/16/2022 1701   CL 93 (L) 11/18/2022 0112   CL 98 11/17/2022 0637   CL 96 (L) 11/16/2022 1701   CO2 24 11/18/2022 0112   CO2 24 11/17/2022 0637   CO2 21 (L) 11/16/2022 1701   GLUCOSE 103 (H) 11/18/2022 0112   GLUCOSE 86 11/17/2022 0637   GLUCOSE 152 (H) 11/16/2022 1701   BUN 17 11/18/2022 0112   BUN 14 11/17/2022 0637   BUN 14 11/16/2022 1701   BUN 25 03/23/2020 1433   BUN 16  03/12/2020 1529   BUN 17 01/11/2019 1419   CREATININE 1.34 (H) 11/18/2022 0112   CREATININE 1.34 (H) 11/17/2022 0637   CREATININE 1.28 (H) 11/16/2022 1701   CREATININE 1.20 (H) 11/02/2022 1524   CREATININE 1.52 (H) 08/02/2022 1449   CREATININE 1.35 (H) 06/03/2022 1528   CALCIUM 8.6 (L) 11/18/2022 0112   CALCIUM 8.2 (L) 11/17/2022 0637   CALCIUM 8.0 (L) 11/16/2022 1701   GFRNONAA 42 (L) 11/18/2022 0112   GFRNONAA 42 (L) 11/17/2022 0637   GFRNONAA 45 (L) 11/16/2022 1701   GFRNONAA 47 (L) 10/01/2020 1147   GFRNONAA 46 (L) 05/26/2020 1553   GFRNONAA 48 (L) 04/30/2020 1206   GFRAA 54 (L) 10/01/2020 1147   GFRAA 54 (L) 05/26/2020 1553   GFRAA 56 (L) 04/30/2020 1206   CBC    Component Value Date/Time   WBC 6.8 11/16/2022 1701   RBC 3.13 (L) 11/16/2022 1701   HGB 9.9 (L) 11/16/2022 1701   HCT 29.8 (L) 11/16/2022 1701   PLT 312 11/16/2022 1701   MCV 95.2 11/16/2022 1701   MCH 31.6 11/16/2022 1701   MCHC 33.2 11/16/2022 1701   RDW 15.9 (H) 11/16/2022 1701   LYMPHSABS 1.3 11/16/2022 1701   MONOABS 0.4 11/16/2022 1701   EOSABS 0.0 11/16/2022 1701   BASOSABS 0.0 11/16/2022 1701   HEPATIC Function Panel Recent Labs  04/07/22 1200 08/02/22 1449 11/04/22 0809 11/16/22 1701 11/17/22 0637  PROT 6.6   < > 6.5 6.6 6.4*  ALBUMIN  --    < > 2.6* 2.2* 2.1*  AST 9*   < > 21 15 13*  ALT 9   < > 25 15 14   ALKPHOS  --    < > 86 73 66  BILIDIR 0.0  --   --   --   --   IBILI 0.1*  --   --   --   --    < > = values in this interval not displayed.   HEMOGLOBIN A1C Lab Results  Component Value Date   MPG 192 08/02/2022   CARDIAC ENZYMES Lab Results  Component Value Date   CKTOTAL 251 (H) 03/07/2022   CKMB 1.6 04/02/2022   TROPONINI <0.03 12/26/2016   TROPONINI <0.03 12/25/2016   BNP No results for input(s): "PROBNP" in the last 8760 hours. TSH Recent Labs    10/04/22 1800 11/02/22 1524 11/17/22 0637  TSH 0.987 1.02 0.198*   CHOLESTEROL Recent Labs    03/08/22 0014  04/07/22 1200 11/03/22 0546  CHOL 215* 147 121    Scheduled Meds:  aspirin EC  81 mg Oral Daily   divalproex  250 mg Oral Q12H   enoxaparin (LOVENOX) injection  40 mg Subcutaneous Q24H   [START ON 11/19/2022] levothyroxine  25 mcg Oral Daily   linagliptin  5 mg Oral Daily   metoprolol succinate  50 mg Oral Daily   multivitamins with iron  1 tablet Oral QHS   paliperidone  3 mg Oral QHS   pantoprazole  80 mg Oral Daily   potassium chloride  10 mEq Oral BID   rosuvastatin  40 mg Oral Daily   [START ON 11/19/2022] torsemide  20 mg Oral Daily   Continuous Infusions: PRN Meds:.acetaminophen **OR** acetaminophen, menthol-cetylpyridinium, methocarbamol, nitroGLYCERIN, ondansetron **OR** ondansetron (ZOFRAN) IV, senna-docusate  Assessment/Plan: 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 Change IV lasix to PO torsemide. Increase activity. F/U in 1-2 weeks.   LOS: 2 days   Time spent including chart review, lab review, examination, discussion with patient/Nurse/Family : 30 min   Orpah Cobb  MD  11/18/2022, 10:12 AM

## 2022-11-18 NOTE — Consult Note (Addendum)
[facility]iatry Face-to-Face Psychiatric Evaluation   Service Date: Nov 18, 2022 LOS:  LOS: 2 days  Reason for consult: Schizophrenia Consulted by: Orpah Cobb, MD  Assessment  [name] Long is a 72 y.o. female with PMH of schizoaffective disorder-bipolar type and has a past medical history of hypothyroidism, T2DM, HTN, COPD, CKD stage III, and chronic diastolic CHF, who was admitted for CHF exacerbation.   Unspecified dementia vs Schizoaffective d/o - bipolar type  Suspect dementia over scza due to psychotic symptom onset in patient's 60's. Also that there is a strong family history of dementia, involving multiple family members on paternal side with dementia. Patient is delusional, no paranoia.  Did not appear internally preoccupied, per children patient does experience auditory and visual hallucinations. Unclear baseline, her current presentation of delusions, intermittent hallucinations, poor insight and send medical and psychiatric condition may be her baseline.  However will trial antipsychotic per below to see if patient is able to gain insight, but unsure if antipsychotic will give meaningful improvement, since there is probably underlying dementia as well. Continuing VPA at this time for mood lability/agitation for now while trialling antipsychotic. Ultimately, patient may not need Depakote as a mood stabilizer and an antipsychotic concurrently if antipsychotic is stabilizing her mood as well as treating the delusions, especially with patient's poor medication adherence. Patient receives outpatient psychiatric services at [facility], but family would like options for alternatives.   Family and legal guardian wants LAI for patient for medication adherence, however after discussion of risk, benefits, side effects, legal guardian wants to hold off on LAI at this time. Rather would prefer forms that can be mixed in with food to help with adherence. Will change antipsychotic to  dissolvable per below, continuing depakote sprinkles. Instructions to med administration has been changed to reflect legal guardian's instruction of mixing meds in patient's food.   Although patient is delusional, she does not meet criteria for inpatient psychiatric hospitalization, as she has not harm to self or others.   Recommend palliative care consult for goals of care discussion with patient and family.  Diagnoses:  Active Hospital problems: Principal Problem:   CHF (congestive heart failure) (HCC) Active Problems:   CHRONIC KIDNEY DISEASE STAGE II (MILD)   Atrial fibrillation (HCC)   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)   Plan  ## Safety and Observation Level:  - Based on my clinical evaluation, I estimate the patient to be at low risk of self harm in the current setting - At this time, we recommend a routine level of observation. This decision is based on my review of the chart including patient's history and current presentation, interview of the patient, mental status examination, and consideration of suicide risk including evaluating suicidal ideation, plan, intent, suicidal or self-harm behaviors, risk factors, and protective factors. This judgment is based on our ability to directly address suicide risk, implement suicide prevention strategies and develop a safety plan while the patient is in the clinical setting. Please contact our team if there is a concern that risk level has changed.   ## Medications:  -- Continued depakote sprinkles 250 mg BID  -- DISCONTINUED invega 3 mg qHS -- STARTED Risperdal M-Tabs 0.5 mg qHS -- Continued multivitamin  ## Medical Decision Making Capacity:  Has legal guardian  ## Further Work-up:  NA  -- most recent EKG on 5/15 had QtC of 434 -- Pertinent labwork reviewed earlier this admission includes:  TSH 0.198, FT4 wnl. Folate  level wnl. Depakote level <10 Estimated Creatinine Clearance: 36  mL/min (A) (by C-G formula based on SCr of 1.34 mg/dL (H)).  Prior to admission: Lipid panel and A1c obtained within the last 4 months. Head CT: No acute intracranial processes B12 level 367  ## Disposition:  -- Per primary team  Resources for outpatient psychiatry, geropsychiatry, neuropsychiatry, and neurology has been provided in patient's discharge instruction.   ## Behavioral / Environmental:  -- Delirium  ##Legal Status IVC (5/15)  Thank you for this consult request. Recommendations have been communicated to the primary team.  We will sign off at this time.   [name], DO PGY-2  history  Relevant Aspects of Hospital Course:  Admitted on 11/16/2022 for CHF exacerbation. 5/16: psych consult, started invega 3 mg, vpa sprinkles 5/17: refused vpa and invega overnight. Invega dc'd. restarted home risperdal  Patient Report:  Patient was initially seen sitting up in hospital bed, no acute distress, talking loudly on the phone with someone. She was upset and did not want to take any medications RN presented. Sitter at bedside.  Stated that she is all healed and does not need to be here and wants to go home. Stated that she will only talk to [name] and that she doesn't need mental health medicines. Denied wanting help with sleep, energy, or anxiety. She denied SI.   She made several delusional statements about how "Aldean Jewett is the one who has mental health problems". She did not appear internally pre-occupied during evaluation.   She declined all medications offered, stated that she doesn't need mental health medications.   Patient became verbally abusing and patient ended exam.   Review of Systems  Unable to perform ROS: Psychiatric disorder    Collateral information:  All 3 children on 5/16  5/17 Ethelyne Lince, [phone] - discussed risk, benefits, side effects of LAI with patient's current kidney disease and how LAI may worsen the kidney disease. Legal  guardian decided to hold off on LAI at this time, but would like to try using dissolvable tablet of antipsychotic and depakote sprinkles mixed in with patient's food.   Psychiatric History:  Dx: Schizoaffective (dx at in 2022 at [facility]) Guardianship: Elonda Husky (daughter) Inpatient psych: ~8-9. Last time [facility] admission first week of April and Dc'd 25th June 2024 for delusions, aggressive behavior and hallucinations  Suicide: no Homicide: no Psychiatrist: [facility] Rx: depakote, zyprexa, risperdal, invega, lorazepam  Oct 2023 was the last time patient received LAI at [facility] Non-adherent to medication  Social Housing: Living with daughter, grandchild Children: adult kids x3 - son x1, daughters x2 Guns/weapons: Denied Nicotine: cigarettes - 1/2 PPD on and off for years EtOH: None currently Illicit substances: Denied   Family History:  Dad, uncles with dementia The patient's family history includes Asthma in her daughter, daughter, and son; Breast cancer in her sister and sister; Colon cancer in her maternal grandmother; Diabetes in her mother; Diabetes (age of onset: 76) in her father; Heart disease in her brother; Heart disease (age of onset: 20) in her mother; High Cholesterol in her brother, mother, and sister; Hypertension in her brother, father, mother, and sister; Rectal cancer in her mother; Stroke in her mother.  Medical History: Past Medical History:  Diagnosis Date   Abscessed tooth 02/09/2020   Acute on chronic congestive heart failure (HCC)    Adenomatous colon polyp    Arthritis    "real bad; all over" (07/12/2017)   Asthma    Atrial fibrillation (HCC)  BIPOLAR DISORDER 08/31/2006   Qualifier: Diagnosis of  By: Irving Burton MD, Clifton Custard     CHRONIC KIDNEY DISEASE STAGE II (MILD) 09/14/2009   Annotation: eGFR 64 Qualifier: Diagnosis of  By: Louanne Belton MD, Rolm Gala     Chronic lower back pain    Congestive heart failure (CHF) (HCC)    COPD (chronic obstructive pulmonary disease) (HCC)  09/23/2010   Diagnosed at [facility] in 2008 (Dr. Maple Hudson)    DM (diabetes mellitus) type II controlled with renal manifestation (HCC) 08/31/2006   Qualifier: Diagnosis of  By: Irving Burton MD, Clifton Custard     Dyspnea    "all my life; since 6th grade" (07/12/2017)   Fatty liver    HYPERCHOLESTEROLEMIA 08/31/2006   Intolerance to Lipitor OK on Crestor but medicaid no longer covering     HYPERTENSION, BENIGN SYSTEMIC 08/31/2006   Qualifier: Diagnosis of  By: Irving Burton MD, Clifton Custard     Hypokalemia    HYPOTHYROIDISM, UNSPECIFIED 08/31/2006   Qualifier: Diagnosis of  By: Irving Burton MD, Clifton Custard     Leg swelling 03/14/2018   Pneumonia    "3 times" (07/12/2017)   Pulmonary nodule    Renal cyst    Schizophrenia (HCC)    Scoliosis    Stomach problems    Thyroid disorder    Surgical History: Past Surgical History:  Procedure Laterality Date   CESAREAN SECTION     FOOT FRACTURE SURGERY Right    "steel plate in it"   FOOT SURGERY     " born w/dislocated foot"   FRACTURE SURGERY     KNEE ARTHROSCOPY Right    TOE SURGERY Bilateral    "both pinky toes"   TONSILLECTOMY AND ADENOIDECTOMY     Medications:   Current Facility-Administered Medications:    acetaminophen (TYLENOL) tablet 500 mg, 500 mg, Oral, Q6H PRN, 500 mg at 11/18/22 0414 **OR** acetaminophen (TYLENOL) suppository 650 mg, 650 mg, Rectal, Q6H PRN, Buena Irish, MD   aspirin EC tablet 81 mg, 81 mg, Oral, Daily, Orpah Cobb, MD, 81 mg at 11/18/22 0932   divalproex (DEPAKOTE SPRINKLE) capsule 250 mg, 250 mg, Oral, Q12H, [name], DO, 250 mg at 11/18/22 0932   enoxaparin (LOVENOX) injection 40 mg, 40 mg, Subcutaneous, Q24H, Buena Irish, MD, 40 mg at 11/17/22 2230   [START ON 11/19/2022] levothyroxine (SYNTHROID) tablet 25 mcg, 25 mcg, Oral, Daily, Orpah Cobb, MD   linagliptin (TRADJENTA) tablet 5 mg, 5 mg, Oral, Daily, Orpah Cobb, MD, 5 mg at 11/18/22 0932   menthol-cetylpyridinium (CEPACOL) lozenge 3 mg, 1 lozenge, Oral, PRN,  Orpah Cobb, MD   methocarbamol (ROBAXIN) tablet 500 mg, 500 mg, Oral, TID BM PRN, Orpah Cobb, MD   metoprolol succinate (TOPROL-XL) 24 hr tablet 50 mg, 50 mg, Oral, Daily, Orpah Cobb, MD, 50 mg at 11/18/22 0932   multivitamins with iron tablet 1 tablet, 1 tablet, Oral, QHS, [name], DO, 1 tablet at 11/17/22 2232   nitroGLYCERIN (NITROSTAT) SL tablet 0.4 mg, 0.4 mg, Sublingual, Q5 min PRN, Orpah Cobb, MD   ondansetron (ZOFRAN) tablet 4 mg, 4 mg, Oral, Q6H PRN **OR** ondansetron (ZOFRAN) injection 4 mg, 4 mg, Intravenous, Q6H PRN, Buena Irish, MD   paliperidone (INVEGA) 24 hr tablet 3 mg, 3 mg, Oral, QHS, [name], DO   pantoprazole (PROTONIX) EC tablet 80 mg, 80 mg, Oral, Daily, Orpah Cobb, MD, 80 mg at 11/18/22 0931   potassium chloride (KLOR-CON M) CR tablet 10 mEq, 10 mEq, Oral, BID, Orpah Cobb, MD, 10 mEq at 11/18/22 [phone]  rosuvastatin (CRESTOR) tablet 40 mg, 40 mg, Oral, Daily, Orpah Cobb, MD, 40 mg at 11/18/22 0931   senna-docusate (Senokot-S) tablet 1 tablet, 1 tablet, Oral, QHS PRN, Buena Irish, MD   [START ON 11/19/2022] torsemide (DEMADEX) tablet 20 mg, 20 mg, Oral, Daily, Orpah Cobb, MD  Allergies: Allergies  Allergen Reactions   Citrus Anaphylaxis and Itching   Fish Allergy Anaphylaxis and Other (See Comments)   Shellfish Allergy Shortness Of Breath and Other (See Comments)    "Affects thyroid" also   Adhesive [Tape] Other (See Comments)    Must have paper tape only   Ibuprofen Swelling and Other (See Comments)    Face swells   Latex Dermatitis   Lipitor [Atorvastatin Calcium] Other (See Comments)    Body aches   Lisinopril Other (See Comments)    PER DR. Sherene Sires (not recalled by patient)   Other Nausea Only and Other (See Comments)    Collards (gas, too)   Codeine Rash   Tramadol Palpitations     Objective  Vital signs:  Temp:  [97.7 F (36.5 C)-98.6 F (37 C)] 98.3 F (36.8 C) (05/17 1108) Pulse Rate:  [71-90] 90  (05/17 1108) Resp:  [15-18] 18 (05/17 1108) BP: (93-138)/(57-72) 93/72 (05/17 1108) SpO2:  [90 %-100 %] 90 % (05/17 1108) Weight:  [62.7 kg] 62.7 kg (05/17 0509)  Psychiatric Specialty Exam:  Presentation  General Appearance:  Appropriate for Environment; Casual; Fairly Groomed  Eye Contact: Minimal  Speech: Clear and Coherent; Normal Rate  Speech Volume: Normal  Handedness: Right   Mood and Affect  Mood: Anxious; Dysphoric  Affect: Appropriate; Congruent; Restricted  Thought Process  Thought Processes: Coherent; Goal Directed; Linear  Descriptions of Associations:Intact  Orientation:Full (Time, Place and Person)  Thought Content:Rumination; Illogical; Perseveration  History of Schizophrenia/Schizoaffective disorder:Yes  Duration of Psychotic Symptoms:Greater than six months  Hallucinations:Hallucinations: None  Ideas of Reference:None  Suicidal Thoughts:Suicidal Thoughts: No  Homicidal Thoughts:Homicidal Thoughts: No   Sensorium  Memory: Recent Poor  Judgment: Impaired  Insight: None  Executive Functions  Concentration: Good  Attention Span: Good  Recall: Poor  Fund of Knowledge: Good  Language: Good  Psychomotor Activity  Psychomotor Activity:Psychomotor Activity: Flacid   Assets  Assets: Communication Skills; Desire for Improvement; Resilience; Social Support; Housing  Sleep  Sleep:Sleep: Poor   Physical Exam: Physical Exam Vitals and nursing note reviewed.  Constitutional:      General: She is not in acute distress.    Appearance: She is ill-appearing. She is not diaphoretic.     Comments: Oversedated, falling asleep during eval multiple times   HENT:     Head: Normocephalic.     Nose: No congestion.  Neurological:     Mental Status: She is oriented to person, place, and time.    Blood pressure 93/72, pulse 90, temperature 98.3 F (36.8 C), temperature source Oral, resp. rate 18, height 5\' 6"  (1.676 m),  weight 62.7 kg, SpO2 90 %. Body mass index is 22.31 kg/m.

## 2022-11-18 NOTE — Care Management Important Message (Signed)
Important Message  Patient Details  Name: [name] Long MRN: [id number] Date of Birth: 1950-10-17   Medicare Important Message Given:  Yes     Renie Ora 11/18/2022, 11:52 AM

## 2022-11-19 LAB — GLUCOSE, CAPILLARY
Glucose-Capillary: 118 mg/dL — ABNORMAL HIGH (ref 70–99)
Glucose-Capillary: 172 mg/dL — ABNORMAL HIGH (ref 70–99)
Glucose-Capillary: 87 mg/dL (ref 70–99)
Glucose-Capillary: 162 mg/dL — ABNORMAL HIGH (ref 70–99)

## 2022-11-19 NOTE — Progress Notes (Signed)
Subjective:  Patient denies any chest pain states breathing has improved Eager to go home.  Spoke with her daughter on the phone states could not come today medical be okay to discharge tomorrow   Objective:  Vital Signs in the last 24 hours: Temp:  [97.3 F (36.3 C)-97.6 F (36.4 C)] 97.6 F (36.4 C) (05/18 1200) Pulse Rate:  [65-75] 65 (05/18 1200) Resp:  [18-20] 20 (05/18 1200) BP: (94-109)/(50-73) 109/73 (05/18 1200) SpO2:  [98 %-99 %] 99 % (05/18 1200)  Intake/Output from previous day: 05/17 0701 - 05/18 0700 In: 1620 [P.O.:1620] Out: -  Intake/Output from this shift: Total I/O In: 480 [P.O.:480] Out: -   Physical Exam: Neck: no adenopathy, no carotid bruit, no JVD, and supple, symmetrical, trachea midline Lungs: Decreased breath sound at bases with faint rales Heart: regular rate and rhythm, S1, S2 normal, and 2/6 systolic murmur noted Abdomen: soft, non-tender; bowel sounds normal; no masses,  no organomegaly Extremities: No clubbing cyanosis 1+ edema noted  Lab Results: Recent Labs    11/16/22 1701  WBC 6.8  HGB 9.9*  PLT 312   Recent Labs    11/17/22 0637 11/18/22 0112  NA 128* 127*  K 3.4* 3.8  CL 98 93*  CO2 24 24  GLUCOSE 86 103*  BUN 14 17  CREATININE 1.34* 1.34*   No results for input(s): "TROPONINI" in the last 72 hours.  Invalid input(s): "CK", "MB" Hepatic Function Panel Recent Labs    11/17/22 0637  PROT 6.4*  ALBUMIN 2.1*  AST 13*  ALT 14  ALKPHOS 66  BILITOT 1.8*   No results for input(s): "CHOL" in the last 72 hours. No results for input(s): "PROTIME" in the last 72 hours.  Imaging: Imaging results have been reviewed and No results found.  Cardiac Studies:  Assessment/Plan:  Resolving acute on chronic systolic and diastolic left heart failure CAD Moderate MR Mild TR Chronic atrial fibrillation Schizophrenia with manic episodes Dementia Type 2 DM HTN HLD Hypothyroidism CKD, IIIa COPD Plan Continue present  management Will possibly discharge home tomorrow if cleared from psych point of view Discussed with her daughter and agrees Medical management  LOS: 3 days    Rinaldo Cloud 11/19/2022, 2:42 PM

## 2022-11-19 NOTE — Progress Notes (Addendum)
Pt remains IVC at this time. Per psych 5/17, pt no longer meets IVC criteria. Will need change in commitment form signed by MD in order to rescind IVC. Secure chat sent to hospitalist and cardiology re form completion, await response. Pt is not able to be discharged until IVC has been officially rescinded.    UPDATE 1545: hospitalist team no longer attending. Cardiology is requesting psych to complete change in commitment form. Spoke to Dr. Sherron Flemings with psych who is agreeable to sign form later today. Once form is signed, pt is able to be discharged or leave AMA provided pt's legal guardian/dtr is agreeable.   UPDATE 1708: Change in commitment form uploaded to [facility] file and serve, envelope [id number]. Unable to upload form to pt's existing [facility] file as no case number documented in the ED. Pt is able to be discharged or leave AMA with consent from legal guardian/dtr.   Dellie Burns, MSW, LCSW [phone] (coverage)

## 2022-11-20 LAB — GLUCOSE, CAPILLARY: Glucose-Capillary: 74 mg/dL (ref 70–99)

## 2022-11-20 MED ORDER — DIVALPROEX SODIUM 125 MG PO CSDR: 250.0000 mg | DELAYED_RELEASE_CAPSULE | Freq: Two times a day (BID) | ORAL | 3 refills | Status: DC

## 2022-11-20 NOTE — Progress Notes (Signed)
Email received from [place] [facility] this morning with initial case number  [id number]. Change in commitment form has been uploaded as envelope [id number] into correct case.   Dellie Burns, MSW, LCSW [phone] (coverage)

## 2022-11-20 NOTE — Discharge Summary (Signed)
NAME: [name] Long, [name] Long MEDICAL RECORD NO: [id number] ACCOUNT NO: [id number] DATE OF BIRTH: Apr 20, 1951 FACILITY: [facility] LOCATION: [facility] PHYSICIAN: Eduardo Osier. Sharyn Lull, MD  Discharge Summary   DATE OF DISCHARGE: 11/20/2022  Admission by [facility]  ADMITTING DIAGNOSES: 1.  Acute exacerbation of congestive heart failure. 2.  Recent non-ST elevation myocardial infarction/medical noncompliance. 3.  Acute encephalopathy/schizophrenia and dementia. 4.  Chronic kidney disease. 5.  Hypokalemia. 6.  Coronary artery disease. 7.  Moderate mitral regurgitation. 8.  Chronic atrial fibrillation. 9.  Hypertension. 10.  Diabetes mellitus. 11.  Hyperlipidemia. 12.  Hypothyroidism. 13.  Chronic obstructive pulmonary disease.  FINAL DIAGNOSES: 1.  Compensated systolic/diastolic congestive heart failure. 2.  Coronary artery disease. 3.  History of recent non-ST elevation myocardial infarction. 4.  Chronic atrial fibrillation. 5.  Schizophrenia with manic episodes. 6.  Dementia. 7.  Hypertension. 8.  Hyperlipidemia. 9.  Type 2 diabetes mellitus. 10.  Hypothyroidism. 11.  Chronic kidney disease stage IIIA. 12.  Chronic obstructive pulmonary disease. 13.  Hyponatremia. 14.  Moderate mitral regurgitation and mild tricuspid regurgitation.  DISCHARGE HOME MEDICATIONS: 1.  Tylenol 650 mg every 4 hours as needed for pain. 2.  Albuterol inhalers, albuterol by nebulizer every 6 hours as needed. 3.  Amlodipine 2.5 mg daily. 4.  Aspirin 81 mg daily. 5.  Forxiga 5 mg daily. 6.  NovoLog sliding scale 3 times with meals as before. 7.  Levothyroxine 50 mcg daily. 8.  Cepacol lozenges 3 times daily as needed. 9.  Metoprolol succinate 50 mg 1 tablet daily. 10.  Nitrostat sublingual p.r.n. 11.  Omeprazole 40 mg daily. 12.  Potassium chloride 10 mEq daily. 13.  Rosuvastatin 40 mg daily. 14.  Carafate 1 gram 4 times daily. 15.  Torsemide 20 mg 1 tablet Monday, Wednesday, Friday. 16.  Tradjenta 5  mg daily. 17.  Valproic acid 1 capsule 2 times daily.  DISCHARGE INSTRUCTIONS:  The patient heart failure instructions have been given. The patient is advised to restrict sodium to 2 grams per 24 hours., and restrict fluid to 1 liter and monitor weight daily.  Also, instruction sheet was given for  schizophrenia.  FOLLOW UP: Patient will follow up with [name] in 2 weeks and her PCP in 1 week.  The patient will also follow up with psych as scheduled.  Condition at discharge is stable.  The patient also had IVC order in place, which has been discontinued by the  psych.  BRIEF HOSPITAL COURSE: The patient is a 72 year old female with past medical history significant for schizophrenia and dementia, chronic kidney disease, hypertension, COPD, hypothyroidism, history of recent NSTEMI with acute pulmonary edema discharged  just 9 days ago.  The patient has been on and off for schizophrenia medication for several years.  She was admitted to inpatient psych facility in [place] and was discharged at the end of April.  The patient came home and ended up having acute MI within  a week or so of being home, the patient is not able to provide any history and she tells the admitting physician there is nothing wrong with her heart.  She denies any shortness of breath or chest pain and says she is here because she got just a little  choked after eating an egg. Patient's history comes from the ED provider and the patient's daughter.  The daughter noticed that the patient looked like she was having very hard time breathing and it sounded like there were bubbles in her lungs.  She also  reports since getting out of hospital last week the patient has not taken any of her medication for cardiac or schizophrenia medication, daughter believes the schizophrenia is the main problem with why her mother cannot take care of herself.  She would  definitely like help with adding her schizophrenia under control.  The patient does  have a history of dementia, also the daughter feels that her wild confabulations are more from her schizophrenia.  The patient is under involuntary commitment at the time  of admission. The patient was admitted by [facility] initially and a psych consult was called.  PHYSICAL EXAMINATION: GENERAL:  She was alert, awake, in no acute distress.  She was hemodynamically stable. HEENT:  Normocephalic, atraumatic.  PERRLA. CARDIOVASCULAR:  Normal rate. LUNGS:  Distal pulses faint, but symmetric.  She has tachypneic rales and areas of diminished breath sounds. ABDOMEN:  Nondistended, soft, nontender. SKIN:  Dry and warm. NEUROLOGIC:  No focal deficit. EXTREMITIES:  Normal range of motion.  No pitting edema.  LABORATORY DATA:  Her sodium was 128, potassium 2.4, chloride 96, glucose 152, BUN 14, creatinine 1.28.  Her BNP was 638.  Repeat BNP was 1132.  Hemoglobin was 9.9, hematocrit 29.8, white count of 6.8.  Her chest x-ray showed increased left basilar  retrocardiac opacities, similar to the frontal radiograph, but compared to prior frontal and lateral radiograph that may represent bibasilar atelectasis.  Difficult to exclude small pleural effusion.  Her EKG showed sinus tachycardia with anteroseptal  Q-waves, no acute ischemic changes were noted.  Repeat electrolytes, sodium was 128, potassium 3.4, BUN 14, creatinine 1.34. Yesterday sodium was 127, potassium 3.8, glucose 103, BUN 17, creatinine 1.34, which has been stable.  Her T4 was 0.98.  Folate  level was 8.4.   Initially patient was admitted by [facility] and psych and cardiac consult was obtained by [name].  The patient refused for further cardiac interventions and was felt to be stable to be discharged to psych unit, but the patient decided to go  home and was cleared by psych to be discharged home.  The patient did not had any episodes of chest pain or shortness of breath. During the hospital stay, the patient did receive IV  Lasix with good diuresis.  The patient will be discharged home on above  medications and will be followed by [name] in 2 weeks and her PMD in 1 week and psych as scheduled.  The patient will be discharged home with the family, her daughter Elonda Husky will be coming to the hospital to take the patient home.  The patient  nurses will be getting in touch with psych to DC the IVC orders prior to the discharge with the family.   SHY D: 11/20/2022 12:12:45 pm T: 11/20/2022 12:59:00 pm  JOB: [id number]/ [id number]

## 2022-11-20 NOTE — Discharge Summary (Signed)
Discharge summary dictated on 11/20/2022 dictation number is [id number]

## 2022-11-21 ENCOUNTER — Other Ambulatory Visit: Payer: Self-pay

## 2022-11-21 ENCOUNTER — Observation Stay (HOSPITAL_COMMUNITY): Payer: Medicare Other

## 2022-11-21 ENCOUNTER — Encounter (HOSPITAL_COMMUNITY): Payer: Self-pay | Admitting: Emergency Medicine

## 2022-11-21 ENCOUNTER — Emergency Department (HOSPITAL_COMMUNITY): Payer: Medicare Other

## 2022-11-21 ENCOUNTER — Inpatient Hospital Stay (HOSPITAL_COMMUNITY)
Admission: EM | Disposition: A | Discharge status: Home Health | Payer: Self-pay | Source: Home / Self Care | Attending: Family Medicine

## 2022-11-21 ENCOUNTER — Inpatient Hospital Stay (HOSPITAL_COMMUNITY)
Admission: EM | Admit: 2022-11-21 | Discharge: 2022-11-24 | DRG: 280 | Disposition: A | Discharge status: Home Health | Payer: Medicare Other | Attending: Internal Medicine | Admitting: Internal Medicine

## 2022-11-21 DIAGNOSIS — I214 Non-ST elevation (NSTEMI) myocardial infarction: Principal | ICD-10-CM | POA: Diagnosis present

## 2022-11-21 DIAGNOSIS — E871 Hypo-osmolality and hyponatremia: Secondary | ICD-10-CM | POA: Diagnosis present

## 2022-11-21 DIAGNOSIS — D649 Anemia, unspecified: Secondary | ICD-10-CM | POA: Diagnosis present

## 2022-11-21 DIAGNOSIS — Z79899 Other long term (current) drug therapy: Secondary | ICD-10-CM

## 2022-11-21 DIAGNOSIS — I4892 Unspecified atrial flutter: Secondary | ICD-10-CM | POA: Diagnosis present

## 2022-11-21 DIAGNOSIS — M1990 Unspecified osteoarthritis, unspecified site: Secondary | ICD-10-CM | POA: Diagnosis present

## 2022-11-21 DIAGNOSIS — F3181 Bipolar II disorder: Secondary | ICD-10-CM | POA: Diagnosis present

## 2022-11-21 DIAGNOSIS — I130 Hypertensive heart and chronic kidney disease with heart failure and stage 1 through stage 4 chronic kidney disease, or unspecified chronic kidney disease: Secondary | ICD-10-CM | POA: Diagnosis present

## 2022-11-21 DIAGNOSIS — R079 Chest pain, unspecified: Secondary | ICD-10-CM | POA: Diagnosis not present

## 2022-11-21 DIAGNOSIS — E119 Type 2 diabetes mellitus without complications: Secondary | ICD-10-CM

## 2022-11-21 DIAGNOSIS — I5033 Acute on chronic diastolic (congestive) heart failure: Secondary | ICD-10-CM | POA: Diagnosis present

## 2022-11-21 DIAGNOSIS — E1122 Type 2 diabetes mellitus with diabetic chronic kidney disease: Secondary | ICD-10-CM | POA: Diagnosis not present

## 2022-11-21 DIAGNOSIS — F0283 Dementia in other diseases classified elsewhere, unspecified severity, with mood disturbance: Secondary | ICD-10-CM | POA: Diagnosis present

## 2022-11-21 DIAGNOSIS — J441 Chronic obstructive pulmonary disease with (acute) exacerbation: Secondary | ICD-10-CM | POA: Diagnosis present

## 2022-11-21 DIAGNOSIS — J9601 Acute respiratory failure with hypoxia: Secondary | ICD-10-CM | POA: Diagnosis present

## 2022-11-21 DIAGNOSIS — Z91018 Allergy to other foods: Secondary | ICD-10-CM

## 2022-11-21 DIAGNOSIS — Z79890 Hormone replacement therapy (postmenopausal): Secondary | ICD-10-CM

## 2022-11-21 DIAGNOSIS — K2289 Other specified disease of esophagus: Secondary | ICD-10-CM | POA: Diagnosis present

## 2022-11-21 DIAGNOSIS — R1310 Dysphagia, unspecified: Secondary | ICD-10-CM | POA: Diagnosis present

## 2022-11-21 DIAGNOSIS — N1832 Chronic kidney disease, stage 3b: Secondary | ICD-10-CM | POA: Diagnosis not present

## 2022-11-21 DIAGNOSIS — I480 Paroxysmal atrial fibrillation: Secondary | ICD-10-CM | POA: Diagnosis present

## 2022-11-21 DIAGNOSIS — I10 Essential (primary) hypertension: Secondary | ICD-10-CM

## 2022-11-21 DIAGNOSIS — E861 Hypovolemia: Secondary | ICD-10-CM | POA: Diagnosis present

## 2022-11-21 DIAGNOSIS — I2510 Atherosclerotic heart disease of native coronary artery without angina pectoris: Secondary | ICD-10-CM | POA: Diagnosis present

## 2022-11-21 DIAGNOSIS — R7989 Other specified abnormal findings of blood chemistry: Secondary | ICD-10-CM | POA: Diagnosis present

## 2022-11-21 DIAGNOSIS — I959 Hypotension, unspecified: Secondary | ICD-10-CM | POA: Diagnosis present

## 2022-11-21 DIAGNOSIS — F250 Schizoaffective disorder, bipolar type: Secondary | ICD-10-CM | POA: Diagnosis not present

## 2022-11-21 DIAGNOSIS — K219 Gastro-esophageal reflux disease without esophagitis: Secondary | ICD-10-CM | POA: Diagnosis present

## 2022-11-21 DIAGNOSIS — J9602 Acute respiratory failure with hypercapnia: Secondary | ICD-10-CM | POA: Diagnosis present

## 2022-11-21 DIAGNOSIS — G4733 Obstructive sleep apnea (adult) (pediatric): Secondary | ICD-10-CM | POA: Diagnosis present

## 2022-11-21 DIAGNOSIS — J449 Chronic obstructive pulmonary disease, unspecified: Secondary | ICD-10-CM | POA: Diagnosis present

## 2022-11-21 DIAGNOSIS — M419 Scoliosis, unspecified: Secondary | ICD-10-CM | POA: Diagnosis present

## 2022-11-21 DIAGNOSIS — E7800 Pure hypercholesterolemia, unspecified: Secondary | ICD-10-CM | POA: Diagnosis not present

## 2022-11-21 DIAGNOSIS — N183 Chronic kidney disease, stage 3 unspecified: Secondary | ICD-10-CM | POA: Diagnosis present

## 2022-11-21 DIAGNOSIS — I499 Cardiac arrhythmia, unspecified: Secondary | ICD-10-CM | POA: Diagnosis not present

## 2022-11-21 DIAGNOSIS — N189 Chronic kidney disease, unspecified: Secondary | ICD-10-CM

## 2022-11-21 DIAGNOSIS — M545 Low back pain, unspecified: Secondary | ICD-10-CM | POA: Diagnosis present

## 2022-11-21 DIAGNOSIS — Z91013 Allergy to seafood: Secondary | ICD-10-CM

## 2022-11-21 DIAGNOSIS — Z794 Long term (current) use of insulin: Secondary | ICD-10-CM | POA: Diagnosis not present

## 2022-11-21 DIAGNOSIS — G9349 Other encephalopathy: Secondary | ICD-10-CM | POA: Diagnosis present

## 2022-11-21 DIAGNOSIS — N182 Chronic kidney disease, stage 2 (mild): Secondary | ICD-10-CM

## 2022-11-21 DIAGNOSIS — Z888 Allergy status to other drugs, medicaments and biological substances status: Secondary | ICD-10-CM

## 2022-11-21 DIAGNOSIS — F17210 Nicotine dependence, cigarettes, uncomplicated: Secondary | ICD-10-CM | POA: Diagnosis present

## 2022-11-21 DIAGNOSIS — N179 Acute kidney failure, unspecified: Secondary | ICD-10-CM | POA: Diagnosis not present

## 2022-11-21 DIAGNOSIS — Z7982 Long term (current) use of aspirin: Secondary | ICD-10-CM

## 2022-11-21 DIAGNOSIS — Z9109 Other allergy status, other than to drugs and biological substances: Secondary | ICD-10-CM

## 2022-11-21 DIAGNOSIS — R0602 Shortness of breath: Secondary | ICD-10-CM | POA: Diagnosis not present

## 2022-11-21 DIAGNOSIS — Z8701 Personal history of pneumonia (recurrent): Secondary | ICD-10-CM

## 2022-11-21 DIAGNOSIS — Z91148 Patient's other noncompliance with medication regimen for other reason: Secondary | ICD-10-CM

## 2022-11-21 DIAGNOSIS — I440 Atrioventricular block, first degree: Secondary | ICD-10-CM | POA: Diagnosis present

## 2022-11-21 DIAGNOSIS — Z720 Tobacco use: Secondary | ICD-10-CM | POA: Diagnosis present

## 2022-11-21 DIAGNOSIS — J90 Pleural effusion, not elsewhere classified: Secondary | ICD-10-CM | POA: Diagnosis not present

## 2022-11-21 DIAGNOSIS — F02818 Dementia in other diseases classified elsewhere, unspecified severity, with other behavioral disturbance: Secondary | ICD-10-CM | POA: Diagnosis present

## 2022-11-21 DIAGNOSIS — Z885 Allergy status to narcotic agent status: Secondary | ICD-10-CM

## 2022-11-21 DIAGNOSIS — I340 Nonrheumatic mitral (valve) insufficiency: Secondary | ICD-10-CM | POA: Diagnosis present

## 2022-11-21 DIAGNOSIS — G8929 Other chronic pain: Secondary | ICD-10-CM | POA: Diagnosis present

## 2022-11-21 DIAGNOSIS — Z886 Allergy status to analgesic agent status: Secondary | ICD-10-CM

## 2022-11-21 DIAGNOSIS — Z83438 Family history of other disorder of lipoprotein metabolism and other lipidemia: Secondary | ICD-10-CM

## 2022-11-21 DIAGNOSIS — K760 Fatty (change of) liver, not elsewhere classified: Secondary | ICD-10-CM | POA: Diagnosis present

## 2022-11-21 DIAGNOSIS — E039 Hypothyroidism, unspecified: Secondary | ICD-10-CM | POA: Diagnosis present

## 2022-11-21 DIAGNOSIS — R069 Unspecified abnormalities of breathing: Secondary | ICD-10-CM | POA: Diagnosis not present

## 2022-11-21 DIAGNOSIS — I5030 Unspecified diastolic (congestive) heart failure: Secondary | ICD-10-CM | POA: Diagnosis present

## 2022-11-21 DIAGNOSIS — I5032 Chronic diastolic (congestive) heart failure: Secondary | ICD-10-CM

## 2022-11-21 DIAGNOSIS — Z8249 Family history of ischemic heart disease and other diseases of the circulatory system: Secondary | ICD-10-CM

## 2022-11-21 DIAGNOSIS — Z86010 Personal history of colonic polyps: Secondary | ICD-10-CM

## 2022-11-21 DIAGNOSIS — Z7984 Long term (current) use of oral hypoglycemic drugs: Secondary | ICD-10-CM

## 2022-11-21 DIAGNOSIS — Z833 Family history of diabetes mellitus: Secondary | ICD-10-CM

## 2022-11-21 HISTORY — PX: LEFT HEART CATH AND CORONARY ANGIOGRAPHY: CATH118249

## 2022-11-21 LAB — BASIC METABOLIC PANEL
Anion gap: 12 (ref 5–15)
BUN: 20 mg/dL (ref 8–23)
CO2: 24 mmol/L (ref 22–32)
Calcium: 8.9 mg/dL (ref 8.9–10.3)
Chloride: 88 mmol/L — ABNORMAL LOW (ref 98–111)
Creatinine, Ser: 1.71 mg/dL — ABNORMAL HIGH (ref 0.44–1.00)
GFR, Estimated: 32 mL/min — ABNORMAL LOW (ref >60)
Glucose, Bld: 229 mg/dL — ABNORMAL HIGH (ref 70–99)
Potassium: 4.1 mmol/L (ref 3.5–5.1)
Sodium: 124 mmol/L — ABNORMAL LOW (ref 135–145)

## 2022-11-21 LAB — POCT I-STAT 7, (LYTES, BLD GAS, ICA,H+H)
Acid-Base Excess: 1 mmol/L (ref 0.0–2.0)
Bicarbonate: 27.5 mmol/L (ref 20.0–28.0)
Calcium, Ion: 1.24 mmol/L (ref 1.15–1.40)
HCT: 31 % — ABNORMAL LOW (ref 36.0–46.0)
Hemoglobin: 10.5 g/dL — ABNORMAL LOW (ref 12.0–15.0)
O2 Saturation: 100 %
Patient temperature: 97.4
Potassium: 3.7 mmol/L (ref 3.5–5.1)
Sodium: 127 mmol/L — ABNORMAL LOW (ref 135–145)
TCO2: 29 mmol/L (ref 22–32)
pCO2 arterial: 51.6 mmHg — ABNORMAL HIGH (ref 32–48)
pH, Arterial: 7.331 — ABNORMAL LOW (ref 7.35–7.45)
pO2, Arterial: 300 mmHg — ABNORMAL HIGH (ref 83–108)

## 2022-11-21 LAB — MRSA NEXT GEN BY PCR, NASAL: MRSA by PCR Next Gen: NOT DETECTED

## 2022-11-21 LAB — GLUCOSE, CAPILLARY
Glucose-Capillary: 78 mg/dL (ref 70–99)
Glucose-Capillary: 208 mg/dL — ABNORMAL HIGH (ref 70–99)
Glucose-Capillary: 82 mg/dL (ref 70–99)

## 2022-11-21 LAB — CBC WITH DIFFERENTIAL/PLATELET
Abs Immature Granulocytes: 0.03 10*3/uL (ref 0.00–0.07)
Basophils Absolute: 0 10*3/uL (ref 0.0–0.1)
Basophils Relative: 0 %
Eosinophils Absolute: 0.1 10*3/uL (ref 0.0–0.5)
Eosinophils Relative: 1 %
HCT: 33.1 % — ABNORMAL LOW (ref 36.0–46.0)
Hemoglobin: 10.9 g/dL — ABNORMAL LOW (ref 12.0–15.0)
Immature Granulocytes: 1 %
Lymphocytes Relative: 22 %
Lymphs Abs: 1.4 10*3/uL (ref 0.7–4.0)
MCH: 31.1 pg (ref 26.0–34.0)
MCHC: 32.9 g/dL (ref 30.0–36.0)
MCV: 94.6 fL (ref 80.0–100.0)
Monocytes Absolute: 0.4 10*3/uL (ref 0.1–1.0)
Monocytes Relative: 7 %
Neutro Abs: 4.6 10*3/uL (ref 1.7–7.7)
Neutrophils Relative %: 69 %
Platelets: 444 10*3/uL — ABNORMAL HIGH (ref 150–400)
RBC: 3.5 MIL/uL — ABNORMAL LOW (ref 3.87–5.11)
RDW: 15.8 % — ABNORMAL HIGH (ref 11.5–15.5)
WBC: 6.6 10*3/uL (ref 4.0–10.5)
nRBC: 0 % (ref 0.0–0.2)

## 2022-11-21 LAB — HEMOGLOBIN A1C
Hgb A1c MFr Bld: 8.1 % — ABNORMAL HIGH (ref 4.8–5.6)
Mean Plasma Glucose: 185.77 mg/dL

## 2022-11-21 LAB — PROCALCITONIN: Procalcitonin: 0.16 ng/mL

## 2022-11-21 LAB — VALPROIC ACID LEVEL: Valproic Acid Lvl: 12 ug/mL — ABNORMAL LOW (ref 50.0–100.0)

## 2022-11-21 LAB — TROPONIN I (HIGH SENSITIVITY)
Troponin I (High Sensitivity): 56 ng/L — ABNORMAL HIGH (ref <18)
Troponin I (High Sensitivity): 60 ng/L — ABNORMAL HIGH (ref <18)

## 2022-11-21 LAB — MAGNESIUM: Magnesium: 1.8 mg/dL (ref 1.7–2.4)

## 2022-11-21 LAB — BRAIN NATRIURETIC PEPTIDE: B Natriuretic Peptide: 697.2 pg/mL — ABNORMAL HIGH (ref 0.0–100.0)

## 2022-11-21 LAB — OSMOLALITY: Osmolality: 278 mOsm/kg (ref 275–295)

## 2022-11-21 SURGERY — LEFT HEART CATH AND CORONARY ANGIOGRAPHY
Anesthesia: LOCAL

## 2022-11-21 MED ORDER — SODIUM CHLORIDE 0.9 % IV SOLN: INTRAVENOUS | Status: DC

## 2022-11-21 MED ORDER — SODIUM CHLORIDE 0.9% FLUSH
3.0000 mL | Freq: Two times a day (BID) | INTRAVENOUS | Status: DC
  Administered 2022-11-23 – 2022-11-24 (×2): 3 mL via INTRAVENOUS

## 2022-11-21 MED ORDER — ALBUTEROL SULFATE (2.5 MG/3ML) 0.083% IN NEBU
2.5000 mg | INHALATION_SOLUTION | Freq: Four times a day (QID) | RESPIRATORY_TRACT | Status: DC | PRN
  Administered 2022-11-21 – 2022-11-23 (×4): 2.5 mg via RESPIRATORY_TRACT
  Filled 2022-11-21 (×4): qty 3

## 2022-11-21 MED ORDER — NICOTINE 7 MG/24HR TD PT24
7.0000 mg | MEDICATED_PATCH | Freq: Every day | TRANSDERMAL | Status: DC
  Administered 2022-11-22 – 2022-11-24 (×3): 7 mg via TRANSDERMAL
  Filled 2022-11-21 (×4): qty 1

## 2022-11-21 MED ORDER — HYDRALAZINE HCL 20 MG/ML IJ SOLN: 10.0000 mg | INTRAMUSCULAR | Status: DC | PRN

## 2022-11-21 MED ORDER — LEVOTHYROXINE SODIUM 50 MCG PO TABS
50.0000 ug | ORAL_TABLET | Freq: Every day | ORAL | Status: DC
  Administered 2022-11-21 – 2022-11-24 (×4): 50 ug via ORAL
  Filled 2022-11-21 (×2): qty 1
  Filled 2022-11-21: qty 2
  Filled 2022-11-21: qty 1

## 2022-11-21 MED ORDER — SODIUM CHLORIDE 0.9% FLUSH
3.0000 mL | Freq: Two times a day (BID) | INTRAVENOUS | Status: DC
  Administered 2022-11-21 – 2022-11-23 (×3): 3 mL via INTRAVENOUS
  Administered 2022-11-23: 10 mL via INTRAVENOUS

## 2022-11-21 MED ORDER — CLOPIDOGREL BISULFATE 75 MG PO TABS
75.0000 mg | ORAL_TABLET | Freq: Every day | ORAL | Status: DC
  Administered 2022-11-22 – 2022-11-24 (×3): 75 mg via ORAL
  Filled 2022-11-21 (×3): qty 1

## 2022-11-21 MED ORDER — HEPARIN (PORCINE) IN NACL 1000-0.9 UT/500ML-% IV SOLN
INTRAVENOUS | Status: DC | PRN
  Administered 2022-11-21 (×2): 500 mL

## 2022-11-21 MED ORDER — LIDOCAINE HCL (PF) 1 % IJ SOLN
INTRAMUSCULAR | Status: DC | PRN
  Administered 2022-11-21: 10 mL

## 2022-11-21 MED ORDER — ACETAMINOPHEN 650 MG RE SUPP: 650.0000 mg | Freq: Four times a day (QID) | RECTAL | Status: DC | PRN

## 2022-11-21 MED ORDER — DIVALPROEX SODIUM 125 MG PO CSDR
250.0000 mg | DELAYED_RELEASE_CAPSULE | Freq: Two times a day (BID) | ORAL | Status: DC
  Administered 2022-11-21 – 2022-11-24 (×6): 250 mg via ORAL
  Filled 2022-11-21 (×7): qty 2

## 2022-11-21 MED ORDER — VERAPAMIL HCL 2.5 MG/ML IV SOLN
INTRAVENOUS | Status: AC
  Filled 2022-11-21: qty 2

## 2022-11-21 MED ORDER — FENTANYL CITRATE (PF) 100 MCG/2ML IJ SOLN
INTRAMUSCULAR | Status: DC | PRN
  Administered 2022-11-21: 50 ug via INTRAVENOUS

## 2022-11-21 MED ORDER — ASPIRIN 81 MG PO TBEC
81.0000 mg | DELAYED_RELEASE_TABLET | Freq: Every day | ORAL | Status: DC
  Administered 2022-11-22: 81 mg via ORAL
  Filled 2022-11-21: qty 1

## 2022-11-21 MED ORDER — NALOXONE HCL 0.4 MG/ML IJ SOLN
0.4000 mg | INTRAMUSCULAR | Status: DC | PRN
  Administered 2022-11-21: 0.4 mg via INTRAVENOUS
  Filled 2022-11-21: qty 1

## 2022-11-21 MED ORDER — ONDANSETRON HCL 4 MG/2ML IJ SOLN: 4.0000 mg | Freq: Four times a day (QID) | INTRAMUSCULAR | Status: DC | PRN

## 2022-11-21 MED ORDER — NITROGLYCERIN 0.4 MG/SPRAY TL SOLN
Status: AC
  Administered 2022-11-21: 12250
  Filled 2022-11-21: qty 4.9

## 2022-11-21 MED ORDER — ENOXAPARIN SODIUM 40 MG/0.4ML IJ SOSY
40.0000 mg | PREFILLED_SYRINGE | INTRAMUSCULAR | Status: DC
  Administered 2022-11-22 – 2022-11-23 (×2): 40 mg via SUBCUTANEOUS
  Filled 2022-11-21 (×2): qty 0.4

## 2022-11-21 MED ORDER — SUCRALFATE 1 GM/10ML PO SUSP
1.0000 g | Freq: Two times a day (BID) | ORAL | Status: DC | PRN
  Administered 2022-11-23 – 2022-11-24 (×2): 1 g via ORAL
  Filled 2022-11-21 (×2): qty 10

## 2022-11-21 MED ORDER — SODIUM CHLORIDE 0.9 % IV SOLN: INTRAVENOUS | Status: AC

## 2022-11-21 MED ORDER — INSULIN ASPART 100 UNIT/ML IJ SOLN
0.0000 [IU] | Freq: Three times a day (TID) | INTRAMUSCULAR | Status: DC
  Administered 2022-11-21: 5 [IU] via SUBCUTANEOUS
  Administered 2022-11-22 – 2022-11-23 (×2): 3 [IU] via SUBCUTANEOUS
  Administered 2022-11-23: 8 [IU] via SUBCUTANEOUS

## 2022-11-21 MED ORDER — SODIUM CHLORIDE 0.9 % IV SOLN
1.0000 g | INTRAVENOUS | Status: DC
  Administered 2022-11-21: 1 g via INTRAVENOUS
  Filled 2022-11-21: qty 10

## 2022-11-21 MED ORDER — FENTANYL CITRATE (PF) 100 MCG/2ML IJ SOLN
INTRAMUSCULAR | Status: AC
  Filled 2022-11-21: qty 2

## 2022-11-21 MED ORDER — ALBUTEROL SULFATE (2.5 MG/3ML) 0.083% IN NEBU
INHALATION_SOLUTION | RESPIRATORY_TRACT | Status: AC
  Administered 2022-11-21: 2.5 mg
  Filled 2022-11-21: qty 3

## 2022-11-21 MED ORDER — SODIUM CHLORIDE 0.9 % IV BOLUS
250.0000 mL | Freq: Once | INTRAVENOUS | Status: AC
  Administered 2022-11-21: 250 mL via INTRAVENOUS

## 2022-11-21 MED ORDER — ACETAMINOPHEN 325 MG PO TABS
650.0000 mg | ORAL_TABLET | Freq: Four times a day (QID) | ORAL | Status: DC | PRN
  Administered 2022-11-22 – 2022-11-24 (×6): 650 mg via ORAL
  Filled 2022-11-21 (×6): qty 2

## 2022-11-21 MED ORDER — LORAZEPAM 2 MG/ML IJ SOLN
INTRAMUSCULAR | Status: AC
  Filled 2022-11-21: qty 1

## 2022-11-21 MED ORDER — INSULIN ASPART 100 UNIT/ML IJ SOLN: 0.0000 [IU] | Freq: Every day | INTRAMUSCULAR | Status: DC

## 2022-11-21 MED ORDER — PANTOPRAZOLE SODIUM 40 MG PO TBEC
40.0000 mg | DELAYED_RELEASE_TABLET | Freq: Every day | ORAL | Status: DC
  Administered 2022-11-22 – 2022-11-24 (×3): 40 mg via ORAL
  Filled 2022-11-21 (×3): qty 1

## 2022-11-21 MED ORDER — LORAZEPAM 2 MG/ML IJ SOLN
INTRAMUSCULAR | Status: AC
  Administered 2022-11-21: 2 mg
  Filled 2022-11-21: qty 1

## 2022-11-21 MED ORDER — LORAZEPAM 2 MG/ML IJ SOLN
1.0000 mg | Freq: Once | INTRAMUSCULAR | Status: AC
  Administered 2022-11-23: 1 mg via INTRAVENOUS
  Filled 2022-11-21: qty 1

## 2022-11-21 MED ORDER — LIDOCAINE HCL (PF) 1 % IJ SOLN
INTRAMUSCULAR | Status: AC
  Filled 2022-11-21: qty 30

## 2022-11-21 MED ORDER — LABETALOL HCL 5 MG/ML IV SOLN: 10.0000 mg | INTRAVENOUS | Status: AC | PRN

## 2022-11-21 MED ORDER — DOPAMINE-DEXTROSE 3.2-5 MG/ML-% IV SOLN: 5.0000 ug/kg/min | INTRAVENOUS | Status: DC

## 2022-11-21 MED ORDER — ROSUVASTATIN CALCIUM 20 MG PO TABS
40.0000 mg | ORAL_TABLET | Freq: Every day | ORAL | Status: DC
  Administered 2022-11-22 – 2022-11-24 (×3): 40 mg via ORAL
  Filled 2022-11-21 (×4): qty 2

## 2022-11-21 MED ORDER — IOHEXOL 350 MG/ML SOLN
INTRAVENOUS | Status: DC | PRN
  Administered 2022-11-21: 80 mL

## 2022-11-21 MED ORDER — INSULIN ASPART 100 UNIT/ML IJ SOLN: 0.0000 [IU] | Freq: Three times a day (TID) | INTRAMUSCULAR | Status: DC

## 2022-11-21 MED ORDER — CHLORHEXIDINE GLUCONATE CLOTH 2 % EX PADS
6.0000 | MEDICATED_PAD | Freq: Every day | CUTANEOUS | Status: DC
  Administered 2022-11-22 – 2022-11-24 (×3): 6 via TOPICAL

## 2022-11-21 MED ORDER — SODIUM CHLORIDE 0.9% FLUSH: 3.0000 mL | INTRAVENOUS | Status: DC | PRN

## 2022-11-21 MED ORDER — ONDANSETRON HCL 4 MG PO TABS: 4.0000 mg | ORAL_TABLET | Freq: Four times a day (QID) | ORAL | Status: DC | PRN

## 2022-11-21 MED ORDER — ACETAMINOPHEN 325 MG PO TABS
ORAL_TABLET | ORAL | Status: AC
  Filled 2022-11-21: qty 2

## 2022-11-21 MED ORDER — FUROSEMIDE 10 MG/ML IJ SOLN
INTRAMUSCULAR | Status: AC
  Administered 2022-11-21: 40 mg
  Filled 2022-11-21: qty 4

## 2022-11-21 MED ORDER — SODIUM CHLORIDE 0.9 % IV SOLN: 250.0000 mL | INTRAVENOUS | Status: DC | PRN

## 2022-11-21 SURGICAL SUPPLY — 8 items
CATH INFINITI 5 FR 3DRC (CATHETERS) IMPLANT
CATH INFINITI 5FR MULTPACK ANG (CATHETERS) IMPLANT
KIT HEART LEFT (KITS) ×1 IMPLANT
PACK CARDIAC CATHETERIZATION (CUSTOM PROCEDURE TRAY) ×1 IMPLANT
SHEATH PINNACLE 5F 10CM (SHEATH) IMPLANT
SYR MEDRAD MARK 7 150ML (SYRINGE) ×1 IMPLANT
TRANSDUCER W/STOPCOCK (MISCELLANEOUS) ×1 IMPLANT
WIRE EMERALD 3MM-J .035X150CM (WIRE) IMPLANT

## 2022-11-21 NOTE — ED Notes (Signed)
Bladder scan pt and it had 0 mL. Pt stated she was taken to the restroom by staff a hour and a half ago.

## 2022-11-21 NOTE — Progress Notes (Signed)
Spoke with [name] over the phone. Discussed medication hold and confirmed ok to give albuterol. Also, informed MD, pt given Ativan but still extremely anxious with HR in the 110s, O2 dropped to highs 80s needing 2L of oxygen. Per [name], will lower NS down to 61mls/hr.

## 2022-11-21 NOTE — ED Provider Notes (Signed)
[place] EMERGENCY DEPARTMENT AT [facility] Provider Note   CSN: [id number] Arrival date & time: 11/21/22  0401     History  Chief Complaint  Patient presents with   Chest Pain    [name] Long is a 72 y.o. female.  The history is provided by the patient and medical records.  Chest Pain  72 year old female with history of bipolar disorder, COPD, schizophrenia, hypertension, diabetes, chronic kidney disease, A-fib, CHF, presenting to the ED with left shoulder, upper chest pain and shortness of breath.  Patient was just discharged from the hospital yesterday after 4-day admission for CHF exacerbation.  Patient reports recently she has mostly been eating manage potatoes and other soft foods, attempted to eat some chicken and rice today and she got choked up.  Daughter gives different account--states since being home yesterday she has not really been taking her medications and she went downstairs tonight to check on her and noticed that she was having increased work of breathing and seem to be "panting".  She then began complaining of chest pain.  Patient is attempting to minimize her symptoms here states "I think I just have a respiratory infection".  She denies any fever.  She has not had any sick contacts since hospital discharge.  Home Medications Prior to Admission medications   Medication Sig Start Date End Date Taking? Authorizing Provider  acetaminophen (TYLENOL) 325 MG tablet Take 2 tablets (650 mg total) by mouth every 4 (four) hours as needed for headache or mild pain. 11/07/22   Orpah Cobb, MD  albuterol (PROVENTIL) (2.5 MG/3ML) 0.083% nebulizer solution Take 3 mLs (2.5 mg total) by nebulization every 6 (six) hours as needed for wheezing or shortness of breath. 11/08/22   Medina-Vargas, Monina C, NP  amLODipine (NORVASC) 2.5 MG tablet Take 2.5 mg by mouth daily. 10/27/22   [provider]  aspirin EC 81 MG tablet Take 1 tablet (81 mg total) by mouth daily.  Swallow whole. 11/08/22   Orpah Cobb, MD  Blood Glucose Monitoring Suppl (FREESTYLE LITE) w/Device KIT 1 each by Does not apply route daily. Dx:E11.9 11/04/20   Ngetich, [name] C, NP  Blood Pressure Monitoring (BLOOD PRESSURE MONITOR/WRIST) KIT 1 Units by Does not apply route daily at 6 (six) AM. 05/26/20   Ngetich, [name] C, NP  dapagliflozin propanediol (FARXIGA) 5 MG TABS tablet Take 1 tablet (5 mg total) by mouth daily before breakfast. 11/08/22   Orpah Cobb, MD  divalproex (DEPAKOTE SPRINKLE) 125 MG capsule Take 2 capsules (250 mg total) by mouth every 12 (twelve) hours. 11/20/22   Rinaldo Cloud, MD  glucose blood (FREESTYLE LITE) test strip Use as instructed 04/07/22   Medina-Vargas, Monina C, NP  insulin aspart (NOVOLOG) 100 UNIT/ML injection Inject 0-15 Units into the skin 3 (three) times daily with meals. 11/07/22   Orpah Cobb, MD  Lancets (FREESTYLE) lancets Use to check blood sugar once daily. Dx: E11.9 04/07/22   Medina-Vargas, Monina C, NP  levothyroxine (SYNTHROID) 50 MCG tablet Take 50 mcg by mouth daily. 08/13/22   [provider]  menthol-cetylpyridinium (CEPACOL) 3 MG lozenge Take 1 lozenge (3 mg total) by mouth as needed for sore throat. 11/07/22   Orpah Cobb, MD  methocarbamol (ROBAXIN) 500 MG tablet TAKE 1 TABLET(500 MG) BY MOUTH THREE TIMES DAILY AS NEEDED FOR MUSCLE SPASMS Patient taking differently: Take 500 mg by mouth 3 (three) times daily between meals as needed for muscle spasms. 10/28/22   Adonis Huguenin, NP  metoprolol  succinate (TOPROL-XL) 50 MG 24 hr tablet Take 1 tablet (50 mg total) by mouth daily. 11/02/22   Medina-Vargas, Monina C, NP  nitroGLYCERIN (NITROSTAT) 0.4 MG SL tablet Place 1 tablet (0.4 mg total) under the tongue as needed. 04/04/22   Sarina Ill, DO  omeprazole (PRILOSEC) 40 MG capsule Take 40 mg by mouth 2 (two) times daily. 08/13/22   [provider]  potassium chloride (KLOR-CON M) 10 MEQ tablet Take 1 tablet (10 mEq total) by  mouth daily. 11/08/22   Orpah Cobb, MD  rosuvastatin (CRESTOR) 40 MG tablet Take 1 tablet (40 mg total) by mouth daily. Patient not taking: Reported on 11/03/2022 04/04/22   Sarina Ill, DO  sucralfate (CARAFATE) 1 GM/10ML suspension Take 1 g by mouth. Daughter stated patient is taking this med but unsure of how she is taking it Patient not taking: Reported on 11/03/2022    [provider]  torsemide (DEMADEX) 20 MG tablet Take 1 tablet (20 mg total) by mouth every Monday, Wednesday, and Friday. 11/09/22   Orpah Cobb, MD  TRADJENTA 5 MG TABS tablet Take 5 mg by mouth daily. Patient not taking: Reported on 11/03/2022 08/15/22   [provider]      Allergies    Citrus, Fish allergy, Shellfish allergy, Adhesive [tape], Ibuprofen, Latex, Lipitor [atorvastatin calcium], Lisinopril, Other, Codeine, and Tramadol    Review of Systems   Review of Systems  Cardiovascular:  Positive for chest pain.  All other systems reviewed and are negative.   Physical Exam Updated Vital Signs BP (!) 140/98 (BP Location: Right Arm)   Pulse 81   Temp 97.7 F (36.5 C) (Oral)   Resp 16   Wt 65 kg   SpO2 100%   BMI 23.13 kg/m  \ Physical Exam Vitals and nursing note reviewed.  Constitutional:      Appearance: She is well-developed.  HENT:     Head: Normocephalic and atraumatic.  Eyes:     Conjunctiva/sclera: Conjunctivae normal.     Pupils: Pupils are equal, round, and reactive to light.  Cardiovascular:     Rate and Rhythm: Normal rate and regular rhythm.     Heart sounds: Normal heart sounds.  Pulmonary:     Effort: Pulmonary effort is normal.     Breath sounds: Rhonchi present.     Comments: Very wet cough on exam, rhonchi bilaterally, sats 98% during exam Abdominal:     General: Bowel sounds are normal.     Palpations: Abdomen is soft.  Musculoskeletal:        General: Normal range of motion.     Cervical back: Normal range of motion.     Comments: Bilateral ankle  edema noted, grossly symmetric  Skin:    General: Skin is warm and dry.  Neurological:     Mental Status: She is alert and oriented to person, place, and time.  Psychiatric:     Comments: Some disorganized thoughts, requiring redirection multiple times, hyper fixated on having an "infection"     ED Results / Procedures / Treatments   Labs (all labs ordered are listed, but only abnormal results are displayed) Labs Reviewed  CBC WITH DIFFERENTIAL/PLATELET - Abnormal; Notable for the following components:      Result Value   RBC 3.50 (*)    Hemoglobin 10.9 (*)    HCT 33.1 (*)    RDW 15.8 (*)    Platelets 444 (*)    All other components within normal limits  BASIC METABOLIC PANEL - Abnormal; Notable for the following components:   Sodium 124 (*)    Chloride 88 (*)    Glucose, Bld 229 (*)    Creatinine, Ser 1.71 (*)    GFR, Estimated 32 (*)    All other components within normal limits  BRAIN NATRIURETIC PEPTIDE - Abnormal; Notable for the following components:   B Natriuretic Peptide 697.2 (*)    All other components within normal limits  TROPONIN I (HIGH SENSITIVITY) - Abnormal; Notable for the following components:   Troponin I (High Sensitivity) 56 (*)    All other components within normal limits  VALPROIC ACID LEVEL  TROPONIN I (HIGH SENSITIVITY)    EKG None  Radiology DG Chest Port 1 View  Result Date: 11/21/2022 CLINICAL DATA:  Shortness of breath and chest pain. EXAM: PORTABLE CHEST 1 VIEW COMPARISON:  11/16/2022 FINDINGS: Lordotic positioning. The cardio pericardial silhouette is enlarged. Interstitial markings are diffusely coarsened with chronic features. Similar appearance of left base collapse/consolidation with possible tiny left pleural effusion. Minimal collapse/consolidation of the right base is similar to prior. Thoracolumbar scoliosis evident. Telemetry leads overlie the chest. IMPRESSION: Similar appearance of left base collapse/consolidation with possible  tiny left pleural effusion. Electronically Signed   By: Kennith Center M.D.   On: 11/21/2022 05:57    Procedures Procedures    Medications Ordered in ED Medications - No data to display  ED Course/ Medical Decision Making/ A&P                             Medical Decision Making Amount and/or Complexity of Data Reviewed Labs: ordered. Radiology: ordered and independent interpretation performed. ECG/medicine tests: ordered and independent interpretation performed.  Risk Decision regarding hospitalization.   72 year old female presenting to the ED with daughters for shortness of breath.  Just released from the hospital yesterday after admission for chest pain and CHF exacerbation.  Apparently refused cardiac cath while inpatient but this is scheduled for 2 weeks from now per family.  Patient gives very similar story to prior ED presentation in which she choked on food and then symptoms began.  Daughter reports she found her sitting on the couch in the tripod position with increased work of breathing.  Patient is afebrile and nontoxic but does have a very wet cough on exam.  She does have rhonchi noted.  She is not tachycardic or hypoxic.  She does have ankle edema bilaterally which is grossly symmetric.    EKG today appears similar to prior.  Labs with worsening renal function-- SrCr today 1.71, GFR 32 (most recent was 1.34, GFR 42).  BNP somewhat improved from prior, 692 today.  Trop 56, most recent was 37.  CXR similar to prior, but seems to have some increased CHF at bases on my review.  I suspect her worsening renal function is likely due to aggressive recent diuresis.  Troponin elevation may be due to poor renal function as well, but will need to be trended.  Results discussed with family.  I feel given her worsening laboratory findings and persistent symptoms, she warrants repeat admission.  They are agreeable.  Discussed with hospitalist, Dr. Antionette Char-- will admit for ongoing  care.  Final Clinical Impression(s) / ED Diagnoses Final diagnoses:  Acute renal failure superimposed on stage 3b chronic kidney disease, unspecified acute renal failure type (HCC)    Rx / DC Orders ED Discharge Orders     None  Garlon Hatchet, PA-C 11/21/22 2841    Nira Conn, MD 11/21/22 [phone]

## 2022-11-21 NOTE — Progress Notes (Addendum)
1345: Pt became increasingly agitated, attempting to sit up and hollering out, requiring multiple nurses to attempt to calm pt down. MD paged. Oxygen saturations dropping down to 70s and tachycardic. Pt heart rate ranging between the 160s-170s. Pt  taking nasal cannula off, switched to venturi mask.   (1350) [name] arrived at bedside. New orders entered, IV lasix and IV ativan given, review MAR. Pt's daughter allowed back in an attempt to calm patient.   1405: IV lasix given through IV in Left AC. IV patent during administration, at this time, site noted to be infiltrated during attempt to flush. MD aware. IV removed at this time.   1411: Pt resting, vitals stable. HRT 70's-80's afib on the monitor. Oxygen saturations 100 with Venturi mask at max level ( 15L). BP  105/63. As of this note, no further urine output in response to lasix administration.

## 2022-11-21 NOTE — ED Notes (Signed)
Patient is verbally abusive to staff and family , explaining to patient the importance of the labs and medications daughter at bedside. She is trying to help her mother and I was telling her how fortunate she was to have support system and she told me to mind my business I was just an Char[employer] fundraiser that gave medicaitons only.

## 2022-11-21 NOTE — ED Notes (Signed)
Cath lab came to get the patient and transport her to cath lab. Consent signed.

## 2022-11-21 NOTE — Progress Notes (Signed)
[place], RN made aware that PICC would not be placed until 5/21.  Patient has IV access at this time, no other requests made.

## 2022-11-21 NOTE — H&P (Signed)
History and Physical    Patient: [name] Long ZOX:[id number] DOB: 1950-12-02 DOA: 11/21/2022 DOS: the patient was seen and examined on 11/21/2022 PCP: Gillis Santa, NP  Patient coming from: Home(with daughter) via EMS  Chief Complaint:  Chief Complaint  Patient presents with   Chest Pain   HPI: PAETYNN FEURTADO is a 72 y.o. female with medical history significant of hypertension, CAD, HFpEF, atrial fibrillation, COPD, diabetes mellitus type 2, CKD stage III, hypothyroidism, schizophrenia, and dementia who presented with complaints of shortness of breath.  History is obtained from the patient, her daughter, and review of records.    She had just recently been hospitalized 5/1-5/6 with plaints of shortness of breath found to have concern for NSTEMI with exacerbation of heart failure that responded to IV diuretics.  She responded well to IV diuresis.  Heart cath was recommended but patient declined.  Subsequently, admitted 5/15-5/19 with acute CHF exacerbation thought secondary to noncompliance with medications.   Cardiology had recommended cardiac cath but the patient had declined. She had been involuntarily committed during this hospitalization and seen again by psychiatry.  Psychiatry patient noted that she did not meet criteria for inpatient psychiatric hospitalization, and ultimately was discharged home with family yesterday afternoon.  After getting home with her daughter reports that she had been doing well up until yesterday evening.  She has had a normal appetite.  Daughter reports that she continues to smoke cigarettes has had a productive cough.  Patient reports that she is coming in due to brain concussion going in and out.  She states that nothing is wrong with her heart and that she is scheduled to see [name] in 2 weeks.  She does admit that she has had a productive cough with green to yellow sputum production.  Patient notes she has had some chest pain as well, but  related to coughing.  She has cut back to only 3 to 4 cigarettes/day on average.    EMS had been called due to patient's complaints. No medications were given prior to arrival.  In the emergency department patient was noted to be afebrile with mild tachypnea and all other vital signs relatively maintained.  Labs significant for hemoglobin 10.9, platelets 444, sodium 124, chloride 88 BUN 20, creatinine 1.71, glucose 229, BNP 697.2, high-sensitivity troponin 56->60.  Chest x-ray noted similar appearance of the left base collapse/consolidation with possible tiny left pleural effusion.   Review of Systems: As mentioned in the history of present illness. All other systems reviewed and are negative. Past Medical History:  Diagnosis Date   Abscessed tooth 02/09/2020   Acute on chronic congestive heart failure (HCC)    Adenomatous colon polyp    Arthritis    "real bad; all over" (07/12/2017)   Asthma    Atrial fibrillation (HCC)    BIPOLAR DISORDER 08/31/2006   Qualifier: Diagnosis of  By: Irving Burton MD, Clifton Custard     CHRONIC KIDNEY DISEASE STAGE II (MILD) 09/14/2009   Annotation: eGFR 64 Qualifier: Diagnosis of  By: Louanne Belton MD, Rolm Gala     Chronic lower back pain    Congestive heart failure (CHF) (HCC)    COPD (chronic obstructive pulmonary disease) (HCC) 09/23/2010   Diagnosed at [facility] in 2008 (Dr. Maple Hudson)    DM (diabetes mellitus) type II controlled with renal manifestation (HCC) 08/31/2006   Qualifier: Diagnosis of  By: Irving Burton MD, Clifton Custard     Dyspnea    "all my life; since 6th grade" (07/12/2017)  Fatty liver    HYPERCHOLESTEROLEMIA 08/31/2006   Intolerance to Lipitor OK on Crestor but medicaid no longer covering     HYPERTENSION, BENIGN SYSTEMIC 08/31/2006   Qualifier: Diagnosis of  By: Irving Burton MD, Aaron     Hypokalemia    HYPOTHYROIDISM, UNSPECIFIED 08/31/2006   Qualifier: Diagnosis of  By: Irving Burton MD, Clifton Custard     Leg swelling 03/14/2018   Pneumonia    "3 times" (07/12/2017)   Pulmonary nodule     Renal cyst    Schizophrenia (HCC)    Scoliosis    Stomach problems    Thyroid disorder    Past Surgical History:  Procedure Laterality Date   CESAREAN SECTION     FOOT FRACTURE SURGERY Right    "steel plate in it"   FOOT SURGERY     " born w/dislocated foot"   FRACTURE SURGERY     KNEE ARTHROSCOPY Right    TOE SURGERY Bilateral    "both pinky toes"   TONSILLECTOMY AND ADENOIDECTOMY     Social History:  reports that she has been smoking cigarettes. She has a 45.00 pack-year smoking history. She has never used smokeless tobacco. She reports that she does not currently use alcohol. She reports that she does not use drugs.  Allergies  Allergen Reactions   Citrus Anaphylaxis and Itching   Fish Allergy Anaphylaxis and Other (See Comments)   Shellfish Allergy Shortness Of Breath and Other (See Comments)    "Affects thyroid" also   Adhesive [Tape] Other (See Comments)    Must have paper tape only   Ibuprofen Swelling and Other (See Comments)    Face swells   Latex Dermatitis   Lipitor [Atorvastatin Calcium] Other (See Comments)    Body aches   Lisinopril Other (See Comments)    PER DR. Sherene Sires (not recalled by patient)   Other Nausea Only and Other (See Comments)    Collards (gas, too)   Codeine Rash   Tramadol Palpitations    Family History  Problem Relation Age of Onset   Heart disease Mother 67   Rectal cancer Mother    Diabetes Mother    High Cholesterol Mother    Hypertension Mother    Stroke Mother    Diabetes Father 31   Hypertension Father    Breast cancer Sister    High Cholesterol Sister    Hypertension Sister    Breast cancer Sister    Heart disease Brother    High Cholesterol Brother    Hypertension Brother    Colon cancer Maternal Grandmother    Asthma Daughter    Asthma Daughter    Asthma Son    Stomach cancer Neg Hx    Esophageal cancer Neg Hx    Pancreatic cancer Neg Hx     Prior to Admission medications   Medication Sig Start Date End Date  Taking? Authorizing Provider  acetaminophen (TYLENOL) 325 MG tablet Take 2 tablets (650 mg total) by mouth every 4 (four) hours as needed for headache or mild pain. 11/07/22   Orpah Cobb, MD  albuterol (PROVENTIL) (2.5 MG/3ML) 0.083% nebulizer solution Take 3 mLs (2.5 mg total) by nebulization every 6 (six) hours as needed for wheezing or shortness of breath. 11/08/22   Medina-Vargas, Monina C, NP  amLODipine (NORVASC) 2.5 MG tablet Take 2.5 mg by mouth daily. 10/27/22   [provider]  aspirin EC 81 MG tablet Take 1 tablet (81 mg total) by mouth daily. Swallow whole. 11/08/22   Orpah Cobb,  MD  Blood Glucose Monitoring Suppl (FREESTYLE LITE) w/Device KIT 1 each by Does not apply route daily. Dx:E11.9 11/04/20   Ngetich, [name] C, NP  Blood Pressure Monitoring (BLOOD PRESSURE MONITOR/WRIST) KIT 1 Units by Does not apply route daily at 6 (six) AM. 05/26/20   Ngetich, [name] C, NP  dapagliflozin propanediol (FARXIGA) 5 MG TABS tablet Take 1 tablet (5 mg total) by mouth daily before breakfast. 11/08/22   Orpah Cobb, MD  divalproex (DEPAKOTE SPRINKLE) 125 MG capsule Take 2 capsules (250 mg total) by mouth every 12 (twelve) hours. 11/20/22   Rinaldo Cloud, MD  glucose blood (FREESTYLE LITE) test strip Use as instructed 04/07/22   Medina-Vargas, Monina C, NP  insulin aspart (NOVOLOG) 100 UNIT/ML injection Inject 0-15 Units into the skin 3 (three) times daily with meals. 11/07/22   Orpah Cobb, MD  Lancets (FREESTYLE) lancets Use to check blood sugar once daily. Dx: E11.9 04/07/22   Medina-Vargas, Monina C, NP  levothyroxine (SYNTHROID) 50 MCG tablet Take 50 mcg by mouth daily. 08/13/22   [provider]  menthol-cetylpyridinium (CEPACOL) 3 MG lozenge Take 1 lozenge (3 mg total) by mouth as needed for sore throat. 11/07/22   Orpah Cobb, MD  methocarbamol (ROBAXIN) 500 MG tablet TAKE 1 TABLET(500 MG) BY MOUTH THREE TIMES DAILY AS NEEDED FOR MUSCLE SPASMS Patient taking differently: Take 500 mg by  mouth 3 (three) times daily between meals as needed for muscle spasms. 10/28/22   Adonis Huguenin, NP  metoprolol succinate (TOPROL-XL) 50 MG 24 hr tablet Take 1 tablet (50 mg total) by mouth daily. 11/02/22   Medina-Vargas, Monina C, NP  nitroGLYCERIN (NITROSTAT) 0.4 MG SL tablet Place 1 tablet (0.4 mg total) under the tongue as needed. 04/04/22   Sarina Ill, DO  omeprazole (PRILOSEC) 40 MG capsule Take 40 mg by mouth 2 (two) times daily. 08/13/22   [provider]  potassium chloride (KLOR-CON M) 10 MEQ tablet Take 1 tablet (10 mEq total) by mouth daily. 11/08/22   Orpah Cobb, MD  rosuvastatin (CRESTOR) 40 MG tablet Take 1 tablet (40 mg total) by mouth daily. Patient not taking: Reported on 11/03/2022 04/04/22   Sarina Ill, DO  sucralfate (CARAFATE) 1 GM/10ML suspension Take 1 g by mouth. Daughter stated patient is taking this med but unsure of how she is taking it Patient not taking: Reported on 11/03/2022    [provider]  torsemide (DEMADEX) 20 MG tablet Take 1 tablet (20 mg total) by mouth every Monday, Wednesday, and Friday. 11/09/22   Orpah Cobb, MD  TRADJENTA 5 MG TABS tablet Take 5 mg by mouth daily. Patient not taking: Reported on 11/03/2022 08/15/22   [provider]    Physical Exam: Vitals:   11/21/22 0545 11/21/22 0600 11/21/22 0630 11/21/22 0715  BP: 126/79 122/73 (!) 111/97 109/68  Pulse:   85 76  Resp: (!) 26 (!) 22 (!) 27 20  Temp:      TempSrc:      SpO2:   100% 100%  Weight:       Constitutional: Elderly female who appears to be in no acute distress. Eyes: PERRL, lids and conjunctivae normal ENMT: Mucous membranes are moist.  Neck: normal, supple, no JVD appreciated at this time Respiratory: Normal respiratory effort with some mild expiratory wheezes appreciated in both lung fields.  O2 saturations maintained on room air.  Patient able to talk in complete sentences.   Cardiovascular: Regular rate and rhythm, no murmurs /  rubs / gallops.  Trace lower extremity edema present.     Abdomen: no tenderness, no masses palpated.  Bowel sounds positive.  Musculoskeletal: no clubbing / cyanosis. No joint deformity upper and lower extremities. Good ROM, no contractures. Normal muscle tone.  Skin: no rashes, lesions, ulcers. No induration Neurologic: CN 2-12 grossly intact.   Strength 5/5 in all 4.  Psychiatric: Poor insight with delusions currently. Alert and oriented x person and place.    Data Reviewed:  EKG revealed a sinus rhythm 82 bpm with first-degree heart block and borderline ST elevation in the lateral leads.  Reviewed labs, imaging, and pertinent records as noted above in the HPI.  Assessment and Plan:  Elevated troponin CAD Acute on chronic.  Patient presented with complaints of chest pain.  High-sensitivity troponins 56 ->60.  EKG noting sinus rhythm with borderline ST elevation in the lateral leads. She had just recently suffered a NSTEMI at the beginning of this month.    Previously recommended heart cath, but patient had declined. -Admit to a cardiac telemetry bed -N.p.o. for cath -Continue aspirin and statin -[name] of cardiology consulted,  will follow-up for any further recommendations  Acute kidney injury superimposed on chronic kidney disease stage IIIb Creatinine elevated up to 1.71 with BUN 20.  Baseline creatinine previously have been around 1.2-1.3.  Suspect secondary to recent diuresis. -Admit to a cardiac telemetry bed -Check urinalysis -Check urine creatinine, urea, and sodium -Bolus 250 mL of IV fluids then place on rate aat 75 ml/hr -Hold furosemide for now -Continue to monitor kidney function  Hyponatremia Acute on chronic.  On admission sodium noted to be 124, but had been in the 127-128 last couple days prior to discharge.  Question a hypovolemic hyponatremia given recent diuresis. -Continue to monitor sodium levels -Gentle IV fluids as noted above  COPD Abnormal chest  x-ray On physical exam patient with some mild expiratory wheezes. Chest x-ray noted similar presentation with left lung consolidation/collapse with concern for trace left-sided pleural effusion unchanged from prior. -Incentive spirometry and flutter valve -Check procalcitonin -Albuterol nebs as needed  Heart failure with preserved ejection fraction Chronic.  Patient does not clinically appear to be fluid overload.  BNP was elevated at 697.2.  Last echocardiogram noted EF to be 50-55% with indeterminate diastolic function on 11/03/22. -Strict I&O's and daily weights -Resume diuretics when deemed medically appropriate  Uncontrolled diabetes mellitus type 2 Initial glucose elevated up to 229.  hemoglobin A1c elevated at 8.3 in 07/2022. -Hypoglycemic protocols -CBGs before every meal with moderate SSI -Adjust insulin regimen as needed  Paroxysmal atrial fibrillation Patient appears to be in sinus rhythm at this time.  Essential hypertension Blood pressures appear to be relatively maintained. -Continue home blood pressure medications amlodipine and metoprolol as tolerated  Schizoaffective disorder, bipolar type Dementia Patient with history of dementia, schizoaffective disorder, and compliance with medications.  Psychiatry has been consulted during prior hospitalizations here recently, but was deemed stable from a psychiatric standpoint prior to discharge. -Delirium precautions -Follow-up Depakote level -Continue Depakote  Normocytic and anemia Hemoglobin 10.9 with appears similar to prior. -Continue to monitor  Hypothyroidism Last TSH noted to be 0.198 on 5/16. -Continue levothyroxine  Hyperlipidemia -Continue Crestor  Tobacco abuse Patient still reports smoking 3 to 4 cigarettes/day on average. -Continue to counsel near cessation of tobacco use  DVT prophylaxis: Lovenox Advance Care Planning:   Code Status: Prior    Consults: Cardiology  Family Communication: 1 of  patient's daughters updated at bedside  Severity of Illness: The appropriate patient status for this patient is OBSERVATION. Observation status is judged to be reasonable and necessary in order to provide the required intensity of service to ensure the patient's safety. The patient's presenting symptoms, physical exam findings, and initial radiographic and laboratory data in the context of their medical condition is felt to place them at decreased risk for further clinical deterioration. Furthermore, it is anticipated that the patient will be medically stable for discharge from the hospital within 2 midnights of admission.   Author: Clydie Braun, MD 11/21/2022 7:43 AM  For on call review [email or web address].

## 2022-11-21 NOTE — ED Triage Notes (Addendum)
Presents from home for L shoulder/upper chest pain with cardiac hx.   H/o 1st degree heart block, has refused suggested cardiac cath in recent hospital admission   EMS exam: rhochi bilateral, 147/86, 84bpm, 98%, RR18 No meds en route  H/o CHF, CKD  Pt has a legal guardian (daughter) [name] Long who is at bedside for exam

## 2022-11-21 NOTE — Consult Note (Signed)
Referring Physician: Madelyn Flavors  [name] Long is an 72 y.o. female.                       Chief Complaint: Chest pain  HPI: 72 years old black female with PMH of type 2 DM, HTN, HLD, hypothyroidism, Schizophrenia, COPD, GERD, CKD, IV has recurrent chest pain. She had recent NSTEMI and had refused cardiac catheterization at that time. Today she is ready for it. She and family understood risks of procedure and possible intervention like stenting. 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  Past Medical History:  Diagnosis Date   Abscessed tooth 02/09/2020   Acute on chronic congestive heart failure (HCC)    Adenomatous colon polyp    Arthritis    "real bad; all over" (07/12/2017)   Asthma    Atrial fibrillation (HCC)    BIPOLAR DISORDER 08/31/2006   Qualifier: Diagnosis of  By: Irving Burton MD, Clifton Custard     CHRONIC KIDNEY DISEASE STAGE II (MILD) 09/14/2009   Annotation: eGFR 64 Qualifier: Diagnosis of  By: Louanne Belton MD, Rolm Gala     Chronic lower back pain    Congestive heart failure (CHF) (HCC)    COPD (chronic obstructive pulmonary disease) (HCC) 09/23/2010   Diagnosed at [facility] in 2008 (Dr. Maple Hudson)    DM (diabetes mellitus) type II controlled with renal manifestation (HCC) 08/31/2006   Qualifier: Diagnosis of  By: Irving Burton MD, Clifton Custard     Dyspnea    "all my life; since 6th grade" (07/12/2017)   Fatty liver    HYPERCHOLESTEROLEMIA 08/31/2006   Intolerance to Lipitor OK on Crestor but medicaid no longer covering     HYPERTENSION, BENIGN SYSTEMIC 08/31/2006   Qualifier: Diagnosis of  By: Irving Burton MD, Aaron     Hypokalemia    HYPOTHYROIDISM, UNSPECIFIED 08/31/2006   Qualifier: Diagnosis of  By: Irving Burton MD, Clifton Custard     Leg swelling 03/14/2018   Pneumonia    "3 times" (07/12/2017)   Pulmonary nodule    Renal cyst    Schizophrenia (HCC)    Scoliosis    Stomach problems    Thyroid disorder       Past Surgical History:  Procedure Laterality Date   CESAREAN SECTION     FOOT  FRACTURE SURGERY Right    "steel plate in it"   FOOT SURGERY     " born w/dislocated foot"   FRACTURE SURGERY     KNEE ARTHROSCOPY Right    TOE SURGERY Bilateral    "both pinky toes"   TONSILLECTOMY AND ADENOIDECTOMY      Family History  Problem Relation Age of Onset   Heart disease Mother 41   Rectal cancer Mother    Diabetes Mother    High Cholesterol Mother    Hypertension Mother    Stroke Mother    Diabetes Father 36   Hypertension Father    Breast cancer Sister    High Cholesterol Sister    Hypertension Sister    Breast cancer Sister    Heart disease Brother    High Cholesterol Brother    Hypertension Brother    Colon cancer Maternal Grandmother    Asthma Daughter    Asthma Daughter    Asthma Son    Stomach cancer Neg Hx    Esophageal cancer Neg Hx    Pancreatic cancer Neg Hx    Social History:  reports that she has been smoking cigarettes. She has  a 45.00 pack-year smoking history. She has never used smokeless tobacco. She reports that she does not currently use alcohol. She reports that she does not use drugs.  Allergies:  Allergies  Allergen Reactions   Citrus Anaphylaxis and Itching   Fish Allergy Anaphylaxis and Other (See Comments)   Shellfish Allergy Shortness Of Breath and Other (See Comments)    "Affects thyroid" also   Adhesive [Tape] Other (See Comments)    Must have paper tape only   Ibuprofen Swelling and Other (See Comments)    Face swells   Latex Dermatitis   Lipitor [Atorvastatin Calcium] Other (See Comments)    Body aches   Lisinopril Other (See Comments)    PER DR. Sherene Sires (not recalled by patient)   Other Nausea Only and Other (See Comments)    Collards (gas, too)   Codeine Rash   Tramadol Palpitations    (Not in a hospital admission)   Results for orders placed or performed during the hospital encounter of 11/21/22 (from the past 48 hour(s))  CBC with Differential     Status: Abnormal   Collection Time: 11/21/22  4:11 AM  Result  Value Ref Range   WBC 6.6 4.0 - 10.5 K/uL   RBC 3.50 (L) 3.87 - 5.11 MIL/uL   Hemoglobin 10.9 (L) 12.0 - 15.0 g/dL   HCT 16.1 (L) 09.6 - 04.5 %   MCV 94.6 80.0 - 100.0 fL   MCH 31.1 26.0 - 34.0 pg   MCHC 32.9 30.0 - 36.0 g/dL   RDW 40.9 (H) 81.1 - 91.4 %   Platelets 444 (H) 150 - 400 K/uL   nRBC 0.0 0.0 - 0.2 %   Neutrophils Relative % 69 %   Neutro Abs 4.6 1.7 - 7.7 K/uL   Lymphocytes Relative 22 %   Lymphs Abs 1.4 0.7 - 4.0 K/uL   Monocytes Relative 7 %   Monocytes Absolute 0.4 0.1 - 1.0 K/uL   Eosinophils Relative 1 %   Eosinophils Absolute 0.1 0.0 - 0.5 K/uL   Basophils Relative 0 %   Basophils Absolute 0.0 0.0 - 0.1 K/uL   Immature Granulocytes 1 %   Abs Immature Granulocytes 0.03 0.00 - 0.07 K/uL    Comment: Performed at [facility], [address], [place], [place] [address]  Basic metabolic panel     Status: Abnormal   Collection Time: 11/21/22  4:11 AM  Result Value Ref Range   Sodium 124 (L) 135 - 145 mmol/L   Potassium 4.1 3.5 - 5.1 mmol/L   Chloride 88 (L) 98 - 111 mmol/L   CO2 24 22 - 32 mmol/L   Glucose, Bld 229 (H) 70 - 99 mg/dL    Comment: Glucose reference range applies only to samples taken after fasting for at least 8 hours.   BUN 20 8 - 23 mg/dL   Creatinine, Ser 6.21 (H) 0.44 - 1.00 mg/dL   Calcium 8.9 8.9 - 30.8 mg/dL   GFR, Estimated 32 (L) >60 mL/min    Comment: (NOTE) Calculated using the CKD-EPI Creatinine Equation (2021)    Anion gap 12 5 - 15    Comment: Performed at [facility], [address] [place], [place] [address]  Brain natriuretic peptide     Status: Abnormal   Collection Time: 11/21/22  4:11 AM  Result Value Ref Range   B Natriuretic Peptide 697.2 (H) 0.0 - 100.0 pg/mL    Comment: Performed at [facility], [address], [place], [place] [address]  Troponin I (High Sensitivity)     Status: Abnormal   Collection Time: 11/21/22  4:11 AM  Result Value Ref Range   Troponin I (High Sensitivity) 56 (H) <18 ng/L     Comment: (NOTE) Elevated high sensitivity troponin I (hsTnI) values and significant  changes across serial measurements may suggest ACS but many other  chronic and acute conditions are known to elevate hsTnI results.  Refer to the "Links" section for chest pain algorithms and additional  guidance. Performed at [facility], [address] [place], [place] [address]   Troponin I (High Sensitivity)     Status: Abnormal   Collection Time: 11/21/22  6:17 AM  Result Value Ref Range   Troponin I (High Sensitivity) 60 (H) <18 ng/L    Comment: (NOTE) Elevated high sensitivity troponin I (hsTnI) values and significant  changes across serial measurements may suggest ACS but many other  chronic and acute conditions are known to elevate hsTnI results.  Refer to the "Links" section for chest pain algorithms and additional  guidance. Performed at [facility], [address] [place], [place] [address]    *Note: Due to a large number of results and/or encounters for the requested time period, some results have not been displayed. A complete set of results can be found in Results Review.   DG Chest Port 1 View  Result Date: 11/21/2022 CLINICAL DATA:  Shortness of breath and chest pain. EXAM: PORTABLE CHEST 1 VIEW COMPARISON:  11/16/2022 FINDINGS: Lordotic positioning. The cardio pericardial silhouette is enlarged. Interstitial markings are diffusely coarsened with chronic features. Similar appearance of left base collapse/consolidation with possible tiny left pleural effusion. Minimal collapse/consolidation of the right base is similar to prior. Thoracolumbar scoliosis evident. Telemetry leads overlie the chest. IMPRESSION: Similar appearance of left base collapse/consolidation with possible tiny left pleural effusion. Electronically Signed   By: Kennith Center M.D.   On: 11/21/2022 05:57    Review Of Systems Constitutional: No fever, chills, weight loss or gain. Eyes: No vision  change, wears glasses. No discharge or pain. Ears: No hearing loss, No tinnitus. Respiratory: No asthma, positive COPD, pneumonias, shortness of breath. No hemoptysis. Cardiovascular: Positive chest pain, palpitation, leg edema. Gastrointestinal: No nausea, vomiting, diarrhea, constipation. No GI bleed. No hepatitis. Positive GERD Genitourinary: No dysuria, hematuria, kidney stone. No incontinance. Neurological: No headache, stroke, seizures.  Psychiatry: Positive psych facility admission for anxiety, depression, suicide. No detox. Skin: No rash. Musculoskeletal: Positive joint pain, fibromyalgia. No neck pain, back pain. Lymphadenopathy: No lymphadenopathy. Hematology: Positive anemia or easy bruising.   Blood pressure 99/76, pulse 83, temperature (!) 97.5 F (36.4 C), temperature source Oral, resp. rate 20, weight 65 kg, SpO2 100 %. Body mass index is 23.13 kg/m. General appearance: alert, cooperative, appears stated age and mild respiratory distress Head: Normocephalic, atraumatic. Eyes: Brown eyes, pink conjunctiva, corneas clear. PERRL, EOM's intact. Neck: No adenopathy, no carotid bruit, no JVD, supple, symmetrical, trachea midline and thyroid not enlarged. Resp: Clearing to auscultation bilaterally. Cardio: Regular rate and rhythm, S1, S2 normal, II/VI systolic murmur, no click, rub or gallop GI: Soft, non-tender; bowel sounds normal; no organomegaly. Extremities: No edema, cyanosis or clubbing. Skin: Warm and dry.  Neurologic: Alert and oriented X 3, normal strength. Normal coordination and gait.  Assessment/Plan Chest pain CAD Chronic systolic left heart failure Type 2 DM HTN HLD CKD, IV Hypothyroidism Schizophrenia with manic episode COPD  Plan: Left heart catheterization, coronary angiography  with possible intervention. IV fluids.  Time spent: Review of old records, Lab, x-rays, EKG, other cardiac tests, examination, discussion with patient/Nurse/Family over 70  minutes.  Ricki Rodriguez, MD  11/21/2022, 9:57 AM

## 2022-11-21 NOTE — Consult Note (Signed)
NAME:  [name] Long, MRN:  [id number], DOB:  Mar 24, 1951, LOS: 0 ADMISSION DATE:  11/21/2022, CONSULTATION DATE:  5/20 REFERRING MD:  [name] , CHIEF COMPLAINT:  Hypoxia, somnolence.    History of Present Illness:  Patient is encephalopathic and/or intubated; therefore, history has been obtained from chart review.  72 year old female with past medical history significant for hypertension, HFpEF, A-fib, DM, COPD, and CKD 3, as well as dementia and schizophrenia.  She was recently hospitalized for NSTEMI and heart failure exacerbation.  She was recommended to have left heart catheterization at that time, but declined.  Of note she was involuntarily committed during that hospitalization, however, psychiatry evaluated and did not feel she warranted inpatient psychiatric admission.  She was discharged on 5/19.  Then on 5/20 she again presented to [facility] emergency department with complaints of dyspnea and chest pain.  She was admitted by the hospitalist service and cardiology was consulted.  She was taken for left heart catheterization  Pertinent  Medical History   has a past medical history of Abscessed tooth (02/09/2020), Acute on chronic congestive heart failure (HCC), Adenomatous colon polyp, Arthritis, Asthma, Atrial fibrillation (HCC), BIPOLAR DISORDER (08/31/2006), CHRONIC KIDNEY DISEASE STAGE II (MILD) (09/14/2009), Chronic lower back pain, Congestive heart failure (CHF) (HCC), COPD (chronic obstructive pulmonary disease) (HCC) (09/23/2010), DM (diabetes mellitus) type II controlled with renal manifestation (HCC) (08/31/2006), Dyspnea, Fatty liver, HYPERCHOLESTEROLEMIA (08/31/2006), HYPERTENSION, BENIGN SYSTEMIC (08/31/2006), Hypokalemia, HYPOTHYROIDISM, UNSPECIFIED (08/31/2006), Leg swelling (03/14/2018), Pneumonia, Pulmonary nodule, Renal cyst, Schizophrenia (HCC), Scoliosis, Stomach problems, and Thyroid disorder.   Significant Hospital Events: Including procedures, antibiotic start and stop dates in  addition to other pertinent events   5/20 admit with Cp and SOB. LHC. Decompensated during cath.   Interim History / Subjective:    Objective   Blood pressure (!) 80/61, pulse 68, temperature (!) 96 F (35.6 C), temperature source Axillary, resp. rate 15, weight 65 kg, SpO2 100 %.        Intake/Output Summary (Last 24 hours) at 11/21/2022 1757 Last data filed at 11/21/2022 1550 Gross per 24 hour  Intake --  Output 600 ml  Net -600 ml   Filed Weights   11/21/22 0410  Weight: 65 kg    Examination: General: chronically ill appearing elderly female in NAD HENT: /AT, pupils pinpoint, mild JVD Lungs: Diminished bases.  Cardiovascular: regular. Flutter on monitor. No edema.  Abdomen: Soft, NT, ND Extremities: No acute deformity. No edema. Immobilizer on the R leg s/p cath Neuro: Minimally responsive. Eye opening to pain only.  GU: Pure-wick device draining dilute urine.   Resolved Hospital Problem list     Assessment & Plan:   NSTEMI: s/p cath 5/20. No cath report yet, but reportedly no stents were placed and medical management was recommended. HFpEF PAF - management per cardiology  Acute respiratory failure with hypoxia and hypercarbia: Edema vs aspiration vs atelectasis - Lasix 40 mg given in cath lab, diuresing well - CTX started by cardiology - I have weaned her down to 2L with sats in the low to mid 90s.  - Trial BiPAP - Family OK with intubation if necessary   Altered mental status: Responded to narcan push, became very agitated. Hypercarbia may also be playing a role - PRN narcan - May need infusion - Trial of BiPAP   AKI on CKD  Schizophrenia  HTN Hypothyroid HLD Anemia DM - per primary  Best Practice (right click and "Reselect all SmartList Selections" daily)   Diet/type: NPO DVT prophylaxis:  LMWH GI prophylaxis: N/A Lines: N/A Foley:  N/A Code Status:  full code Last date of multidisciplinary goals of care discussion [ ]   Labs    CBC: Recent Labs  Lab 11/16/22 1701 11/21/22 0411 11/21/22 1543  WBC 6.8 6.6  --   NEUTROABS 5.1 4.6  --   HGB 9.9* 10.9* 10.5*  HCT 29.8* 33.1* 31.0*  MCV 95.2 94.6  --   PLT 312 444*  --     Basic Metabolic Panel: Recent Labs  Lab 11/16/22 1701 11/17/22 0637 11/18/22 0112 11/21/22 0411 11/21/22 0906 11/21/22 1543  NA 128* 128* 127* 124*  --  127*  K 2.4* 3.4* 3.8 4.1  --  3.7  CL 96* 98 93* 88*  --   --   CO2 21* 24 24 24   --   --   GLUCOSE 152* 86 103* 229*  --   --   BUN 14 14 17 20   --   --   CREATININE 1.28* 1.34* 1.34* 1.71*  --   --   CALCIUM 8.0* 8.2* 8.6* 8.9  --   --   MG  --  1.7  --   --  1.8  --    GFR: Estimated Creatinine Clearance: 28.2 mL/min (A) (by C-G formula based on SCr of 1.71 mg/dL (H)). Recent Labs  Lab 11/16/22 1701 11/21/22 0411 11/21/22 0906  PROCALCITON  --   --  0.16  WBC 6.8 6.6  --     Liver Function Tests: Recent Labs  Lab 11/16/22 1701 11/17/22 0637  AST 15 13*  ALT 15 14  ALKPHOS 73 66  BILITOT 1.7* 1.8*  PROT 6.6 6.4*  ALBUMIN 2.2* 2.1*   No results for input(s): "LIPASE", "AMYLASE" in the last 168 hours. No results for input(s): "AMMONIA" in the last 168 hours.  ABG    Component Value Date/Time   PHART 7.331 (L) 11/21/2022 1543   PCO2ART 51.6 (H) 11/21/2022 1543   PO2ART 300 (H) 11/21/2022 1543   HCO3 27.5 11/21/2022 1543   TCO2 29 11/21/2022 1543   ACIDBASEDEF 4.0 (H) 11/03/2022 0303   O2SAT 100 11/21/2022 1543     Coagulation Profile: No results for input(s): "INR", "PROTIME" in the last 168 hours.  Cardiac Enzymes: No results for input(s): "CKTOTAL", "CKMB", "CKMBINDEX", "TROPONINI" in the last 168 hours.  HbA1C: HbA1c, POC (controlled diabetic range)  Date/Time Value Ref Range Status  03/23/2020 03:00 PM 10.3 (A) 0.0 - 7.0 % Final  02/03/2020 03:21 PM 12.0 (A) 0.0 - 7.0 % Final   Hgb A1c MFr Bld  Date/Time Value Ref Range Status  08/02/2022 02:49 PM 8.3 (H) <5.7 % of total Hgb Final     Comment:    For someone without known diabetes, a hemoglobin A1c value of 6.5% or greater indicates that they may have  diabetes and this should be confirmed with a follow-up  test. . For someone with known diabetes, a value <7% indicates  that their diabetes is well controlled and a value  greater than or equal to 7% indicates suboptimal  control. A1c targets should be individualized based on  duration of diabetes, age, comorbid conditions, and  other considerations. . Currently, no consensus exists regarding use of hemoglobin A1c for diagnosis of diabetes for children. .   04/07/2022 12:00 PM 8.9 (H) <5.7 % of total Hgb Final    Comment:    For someone without known diabetes, a hemoglobin A1c value of 6.5% or greater indicates that they  may have  diabetes and this should be confirmed with a follow-up  test. . For someone with known diabetes, a value <7% indicates  that their diabetes is well controlled and a value  greater than or equal to 7% indicates suboptimal  control. A1c targets should be individualized based on  duration of diabetes, age, comorbid conditions, and  other considerations. . Currently, no consensus exists regarding use of hemoglobin A1c for diagnosis of diabetes for children. .     CBG: Recent Labs  Lab 11/19/22 1645 11/19/22 2117 11/20/22 0615 11/21/22 1158 11/21/22 1609  GLUCAP 87 162* 74 78 208*    Review of Systems:   Patient is encephalopathic and/or intubated; therefore, history has been obtained from chart review.    Past Medical History:  She,  has a past medical history of Abscessed tooth (02/09/2020), Acute on chronic congestive heart failure (HCC), Adenomatous colon polyp, Arthritis, Asthma, Atrial fibrillation (HCC), BIPOLAR DISORDER (08/31/2006), CHRONIC KIDNEY DISEASE STAGE II (MILD) (09/14/2009), Chronic lower back pain, Congestive heart failure (CHF) (HCC), COPD (chronic obstructive pulmonary disease) (HCC) (09/23/2010), DM  (diabetes mellitus) type II controlled with renal manifestation (HCC) (08/31/2006), Dyspnea, Fatty liver, HYPERCHOLESTEROLEMIA (08/31/2006), HYPERTENSION, BENIGN SYSTEMIC (08/31/2006), Hypokalemia, HYPOTHYROIDISM, UNSPECIFIED (08/31/2006), Leg swelling (03/14/2018), Pneumonia, Pulmonary nodule, Renal cyst, Schizophrenia (HCC), Scoliosis, Stomach problems, and Thyroid disorder.   Surgical History:   Past Surgical History:  Procedure Laterality Date   CESAREAN SECTION     FOOT FRACTURE SURGERY Right    "steel plate in it"   FOOT SURGERY     " born w/dislocated foot"   FRACTURE SURGERY     KNEE ARTHROSCOPY Right    TOE SURGERY Bilateral    "both pinky toes"   TONSILLECTOMY AND ADENOIDECTOMY       Social History:   reports that she has been smoking cigarettes. She has a 45.00 pack-year smoking history. She has never used smokeless tobacco. She reports that she does not currently use alcohol. She reports that she does not use drugs.   Family History:  Her family history includes Asthma in her daughter, daughter, and son; Breast cancer in her sister and sister; Colon cancer in her maternal grandmother; Diabetes in her mother; Diabetes (age of onset: 25) in her father; Heart disease in her brother; Heart disease (age of onset: 89) in her mother; High Cholesterol in her brother, mother, and sister; Hypertension in her brother, father, mother, and sister; Rectal cancer in her mother; Stroke in her mother. There is no history of Stomach cancer, Esophageal cancer, or Pancreatic cancer.   Allergies Allergies  Allergen Reactions   Citrus Anaphylaxis and Itching   Fish Allergy Anaphylaxis and Other (See Comments)   Shellfish Allergy Shortness Of Breath and Other (See Comments)    "Affects thyroid" also   Adhesive [Tape] Other (See Comments)    Must have paper tape only   Ibuprofen Swelling and Other (See Comments)    Face swells   Latex Dermatitis   Lipitor [Atorvastatin Calcium] Other (See  Comments)    Body aches   Lisinopril Other (See Comments)    PER DR. Sherene Sires (not recalled by patient)   Other Nausea Only and Other (See Comments)    Collards (gas, too)   Codeine Rash   Tramadol Palpitations     Home Medications  Prior to Admission medications   Medication Sig Start Date End Date Taking? Authorizing Provider  acetaminophen (TYLENOL) 325 MG tablet Take 2 tablets (650 mg total) by mouth every 4 (  four) hours as needed for headache or mild pain. 11/07/22  Yes Orpah Cobb, MD  albuterol (PROVENTIL) (2.5 MG/3ML) 0.083% nebulizer solution Take 3 mLs (2.5 mg total) by nebulization every 6 (six) hours as needed for wheezing or shortness of breath. 11/08/22  Yes Medina-Vargas, Monina C, NP  amLODipine (NORVASC) 2.5 MG tablet Take 2.5 mg by mouth daily. 10/27/22  Yes [provider]  aspirin EC 81 MG tablet Take 1 tablet (81 mg total) by mouth daily. Swallow whole. 11/08/22  Yes Orpah Cobb, MD  dapagliflozin propanediol (FARXIGA) 5 MG TABS tablet Take 1 tablet (5 mg total) by mouth daily before breakfast. 11/08/22  Yes Orpah Cobb, MD  divalproex (DEPAKOTE SPRINKLE) 125 MG capsule Take 2 capsules (250 mg total) by mouth every 12 (twelve) hours. 11/20/22  Yes Rinaldo Cloud, MD  insulin aspart (NOVOLOG) 100 UNIT/ML injection Inject 0-15 Units into the skin 3 (three) times daily with meals. 11/07/22  Yes Orpah Cobb, MD  levothyroxine (SYNTHROID) 50 MCG tablet Take 50 mcg by mouth daily. 08/13/22  Yes [provider]  menthol-cetylpyridinium (CEPACOL) 3 MG lozenge Take 1 lozenge (3 mg total) by mouth as needed for sore throat. 11/07/22  Yes Orpah Cobb, MD  methocarbamol (ROBAXIN) 500 MG tablet TAKE 1 TABLET(500 MG) BY MOUTH THREE TIMES DAILY AS NEEDED FOR MUSCLE SPASMS Patient taking differently: Take 500 mg by mouth 3 (three) times daily between meals as needed for muscle spasms. 10/28/22  Yes Adonis Huguenin, NP  metoprolol succinate (TOPROL-XL) 50 MG 24 hr tablet Take 1  tablet (50 mg total) by mouth daily. 11/02/22  Yes Medina-Vargas, Monina C, NP  nitroGLYCERIN (NITROSTAT) 0.4 MG SL tablet Place 1 tablet (0.4 mg total) under the tongue as needed. Patient taking differently: Place 0.4 mg under the tongue as needed for chest pain. 04/04/22  Yes Sarina Ill, DO  omeprazole (PRILOSEC) 40 MG capsule Take 40 mg by mouth 2 (two) times daily. 08/13/22  Yes [provider]  potassium chloride (KLOR-CON M) 10 MEQ tablet Take 1 tablet (10 mEq total) by mouth daily. 11/08/22  Yes Orpah Cobb, MD  rosuvastatin (CRESTOR) 40 MG tablet Take 1 tablet (40 mg total) by mouth daily. 04/04/22  Yes Sarina Ill, DO  torsemide [facility]) 20 MG tablet Take 1 tablet (20 mg total) by mouth every Monday, Wednesday, and Friday. 11/09/22  Yes Orpah Cobb, MD  TRADJENTA 5 MG TABS tablet Take 5 mg by mouth daily. 08/15/22  Yes [provider]  Blood Glucose Monitoring Suppl (FREESTYLE LITE) w/Device KIT 1 each by Does not apply route daily. Dx:E11.9 11/04/20   Ngetich, [name] C, NP  Blood Pressure Monitoring (BLOOD PRESSURE MONITOR/WRIST) KIT 1 Units by Does not apply route daily at 6 (six) AM. 05/26/20   Ngetich, [name] C, NP  glucose blood (FREESTYLE LITE) test strip Use as instructed 04/07/22   Medina-Vargas, Monina C, NP  Lancets (FREESTYLE) lancets Use to check blood sugar once daily. Dx: E11.9 04/07/22   Medina-Vargas, Monina C, NP  sucralfate (CARAFATE) 1 GM/10ML suspension Take 1 g by mouth 2 (two) times daily as needed (antacid.).    [provider]     Critical care time:      Joneen Roach, AGACNP-BC [facility]  See Amion for personal pager PCCM on call pager [phone] until 7pm. Please call Elink 7p-7a. [phone]  11/21/2022 6:46 PM

## 2022-11-21 NOTE — Progress Notes (Signed)
Orthopedic Tech Progress Note Patient Details:  [name] Long 06-Mar-1951 [id number]  Applied Knee Immobilizer Ortho Devices Type of Ortho Device: Knee Immobilizer Ortho Device/Splint Location: RLE Ortho Device/Splint Interventions: Ordered, Application, Adjustment   Post Interventions Patient Tolerated: Well Instructions Provided: Adjustment of device, Care of device  Sherilyn Banker 11/21/2022, 5:18 PM

## 2022-11-22 ENCOUNTER — Encounter (HOSPITAL_COMMUNITY): Payer: Self-pay | Admitting: Cardiovascular Disease

## 2022-11-22 DIAGNOSIS — F3181 Bipolar II disorder: Secondary | ICD-10-CM | POA: Diagnosis present

## 2022-11-22 DIAGNOSIS — F02818 Dementia in other diseases classified elsewhere, unspecified severity, with other behavioral disturbance: Secondary | ICD-10-CM | POA: Diagnosis not present

## 2022-11-22 DIAGNOSIS — J9601 Acute respiratory failure with hypoxia: Secondary | ICD-10-CM | POA: Diagnosis present

## 2022-11-22 DIAGNOSIS — G9349 Other encephalopathy: Secondary | ICD-10-CM | POA: Diagnosis present

## 2022-11-22 DIAGNOSIS — I214 Non-ST elevation (NSTEMI) myocardial infarction: Secondary | ICD-10-CM | POA: Diagnosis present

## 2022-11-22 DIAGNOSIS — E1122 Type 2 diabetes mellitus with diabetic chronic kidney disease: Secondary | ICD-10-CM | POA: Diagnosis present

## 2022-11-22 DIAGNOSIS — I5032 Chronic diastolic (congestive) heart failure: Secondary | ICD-10-CM | POA: Diagnosis not present

## 2022-11-22 DIAGNOSIS — E039 Hypothyroidism, unspecified: Secondary | ICD-10-CM | POA: Diagnosis present

## 2022-11-22 DIAGNOSIS — E7800 Pure hypercholesterolemia, unspecified: Secondary | ICD-10-CM | POA: Diagnosis present

## 2022-11-22 DIAGNOSIS — F250 Schizoaffective disorder, bipolar type: Secondary | ICD-10-CM

## 2022-11-22 DIAGNOSIS — R079 Chest pain, unspecified: Secondary | ICD-10-CM | POA: Diagnosis present

## 2022-11-22 DIAGNOSIS — Z794 Long term (current) use of insulin: Secondary | ICD-10-CM | POA: Diagnosis not present

## 2022-11-22 DIAGNOSIS — J449 Chronic obstructive pulmonary disease, unspecified: Secondary | ICD-10-CM | POA: Diagnosis not present

## 2022-11-22 DIAGNOSIS — I340 Nonrheumatic mitral (valve) insufficiency: Secondary | ICD-10-CM | POA: Diagnosis present

## 2022-11-22 DIAGNOSIS — I4892 Unspecified atrial flutter: Secondary | ICD-10-CM | POA: Diagnosis present

## 2022-11-22 DIAGNOSIS — E861 Hypovolemia: Secondary | ICD-10-CM | POA: Diagnosis present

## 2022-11-22 DIAGNOSIS — I130 Hypertensive heart and chronic kidney disease with heart failure and stage 1 through stage 4 chronic kidney disease, or unspecified chronic kidney disease: Secondary | ICD-10-CM | POA: Diagnosis present

## 2022-11-22 DIAGNOSIS — J9602 Acute respiratory failure with hypercapnia: Secondary | ICD-10-CM | POA: Diagnosis present

## 2022-11-22 DIAGNOSIS — N1832 Chronic kidney disease, stage 3b: Secondary | ICD-10-CM | POA: Diagnosis present

## 2022-11-22 DIAGNOSIS — I480 Paroxysmal atrial fibrillation: Secondary | ICD-10-CM | POA: Diagnosis present

## 2022-11-22 DIAGNOSIS — I5033 Acute on chronic diastolic (congestive) heart failure: Secondary | ICD-10-CM | POA: Diagnosis present

## 2022-11-22 DIAGNOSIS — E871 Hypo-osmolality and hyponatremia: Secondary | ICD-10-CM | POA: Diagnosis present

## 2022-11-22 DIAGNOSIS — K760 Fatty (change of) liver, not elsewhere classified: Secondary | ICD-10-CM | POA: Diagnosis present

## 2022-11-22 DIAGNOSIS — J441 Chronic obstructive pulmonary disease with (acute) exacerbation: Secondary | ICD-10-CM | POA: Diagnosis present

## 2022-11-22 DIAGNOSIS — D649 Anemia, unspecified: Secondary | ICD-10-CM | POA: Diagnosis present

## 2022-11-22 DIAGNOSIS — I10 Essential (primary) hypertension: Secondary | ICD-10-CM | POA: Diagnosis not present

## 2022-11-22 DIAGNOSIS — N179 Acute kidney failure, unspecified: Secondary | ICD-10-CM | POA: Diagnosis present

## 2022-11-22 DIAGNOSIS — F0283 Dementia in other diseases classified elsewhere, unspecified severity, with mood disturbance: Secondary | ICD-10-CM | POA: Diagnosis present

## 2022-11-22 DIAGNOSIS — I959 Hypotension, unspecified: Secondary | ICD-10-CM | POA: Diagnosis present

## 2022-11-22 LAB — BASIC METABOLIC PANEL
Anion gap: 8 (ref 5–15)
BUN: 19 mg/dL (ref 8–23)
CO2: 27 mmol/L (ref 22–32)
Calcium: 8.2 mg/dL — ABNORMAL LOW (ref 8.9–10.3)
Chloride: 94 mmol/L — ABNORMAL LOW (ref 98–111)
Creatinine, Ser: 1.16 mg/dL — ABNORMAL HIGH (ref 0.44–1.00)
GFR, Estimated: 50 mL/min — ABNORMAL LOW (ref >60)
Glucose, Bld: 188 mg/dL — ABNORMAL HIGH (ref 70–99)
Potassium: 4.1 mmol/L (ref 3.5–5.1)
Sodium: 129 mmol/L — ABNORMAL LOW (ref 135–145)

## 2022-11-22 LAB — CBC
HCT: 26.9 % — ABNORMAL LOW (ref 36.0–46.0)
Hemoglobin: 8.9 g/dL — ABNORMAL LOW (ref 12.0–15.0)
MCH: 31.2 pg (ref 26.0–34.0)
MCHC: 33.1 g/dL (ref 30.0–36.0)
MCV: 94.4 fL (ref 80.0–100.0)
Platelets: 341 10*3/uL (ref 150–400)
RBC: 2.85 MIL/uL — ABNORMAL LOW (ref 3.87–5.11)
RDW: 15.9 % — ABNORMAL HIGH (ref 11.5–15.5)
WBC: 5.5 10*3/uL (ref 4.0–10.5)
nRBC: 0 % (ref 0.0–0.2)

## 2022-11-22 LAB — GLUCOSE, CAPILLARY
Glucose-Capillary: 114 mg/dL — ABNORMAL HIGH (ref 70–99)
Glucose-Capillary: 94 mg/dL (ref 70–99)
Glucose-Capillary: 171 mg/dL — ABNORMAL HIGH (ref 70–99)
Glucose-Capillary: 113 mg/dL — ABNORMAL HIGH (ref 70–99)

## 2022-11-22 MED ORDER — BUDESONIDE 0.25 MG/2ML IN SUSP
0.2500 mg | Freq: Two times a day (BID) | RESPIRATORY_TRACT | Status: DC
  Administered 2022-11-22 – 2022-11-24 (×5): 0.25 mg via RESPIRATORY_TRACT
  Filled 2022-11-22 (×5): qty 2

## 2022-11-22 MED ORDER — REVEFENACIN 175 MCG/3ML IN SOLN
175.0000 ug | Freq: Every day | RESPIRATORY_TRACT | Status: DC
  Administered 2022-11-22 – 2022-11-24 (×3): 175 ug via RESPIRATORY_TRACT
  Filled 2022-11-22 (×3): qty 3

## 2022-11-22 MED ORDER — LORAZEPAM 1 MG PO TABS
0.5000 mg | ORAL_TABLET | Freq: Once | ORAL | Status: AC
  Administered 2022-11-22: 0.5 mg via ORAL
  Filled 2022-11-22: qty 1

## 2022-11-22 MED ORDER — METHYLPREDNISOLONE SODIUM SUCC 125 MG IJ SOLR
80.0000 mg | INTRAMUSCULAR | Status: DC
  Filled 2022-11-22: qty 2

## 2022-11-22 MED ORDER — SODIUM CHLORIDE 0.9 % IV SOLN
2.0000 g | INTRAVENOUS | Status: DC
  Administered 2022-11-22 – 2022-11-23 (×2): 2 g via INTRAVENOUS
  Filled 2022-11-22 (×2): qty 20

## 2022-11-22 MED ORDER — METHYLPREDNISOLONE SODIUM SUCC 40 MG IJ SOLR
40.0000 mg | INTRAMUSCULAR | Status: DC
  Administered 2022-11-23: 40 mg via INTRAVENOUS
  Filled 2022-11-22: qty 1

## 2022-11-22 MED ORDER — ARFORMOTEROL TARTRATE 15 MCG/2ML IN NEBU
15.0000 ug | INHALATION_SOLUTION | Freq: Two times a day (BID) | RESPIRATORY_TRACT | Status: DC
  Administered 2022-11-22 – 2022-11-24 (×5): 15 ug via RESPIRATORY_TRACT
  Filled 2022-11-22 (×5): qty 2

## 2022-11-22 MED ORDER — LINAGLIPTIN 5 MG PO TABS
5.0000 mg | ORAL_TABLET | Freq: Every day | ORAL | Status: DC
  Administered 2022-11-22 – 2022-11-24 (×3): 5 mg via ORAL
  Filled 2022-11-22 (×3): qty 1

## 2022-11-22 MED FILL — Lorazepam Inj 2 MG/ML: INTRAMUSCULAR | Qty: 1 | Status: AC

## 2022-11-22 MED FILL — Verapamil HCl IV Soln 2.5 MG/ML: INTRAVENOUS | Qty: 2 | Status: AC

## 2022-11-22 NOTE — Evaluation (Addendum)
Physical Therapy Evaluation/ discharge Patient Details Name: [name] Long MRN: [id number] DOB: 12-Mar-1951 Today's Date: 11/22/2022  History of Present Illness  72 yo female admitted 5/20 with left shoulder pain after D/C 5/19 for 4 day CHF admission. 5/20 LHC with flash pulmonary edema. PMhx: bipolar disorder, schizophrenia, HTN, DM, CKD, AFib, CHF  Clinical Impression  Pt pleasant, confused and able to walk in hall with RW. Pt lives with daughter who provides assist/supervision due to cognition and pt normally walks with cane but does report fall in the last 6 months. Pt at baseline functional level with need for continued supervision for safety at home and would benefit from RW to maximize safety and gait tolerance. Encouraged daily ambulation acutely and at home.   SPO2 95% on RA, HR 98-114       Recommendations for follow up therapy are one component of a multi-disciplinary discharge planning process, led by the attending physician.  Recommendations may be updated based on patient status, additional functional criteria and insurance authorization.  Follow Up Recommendations       Assistance Recommended at Discharge Frequent or constant Supervision/Assistance  Patient can return home with the following  A little help with bathing/dressing/bathroom;Assistance with cooking/housework;Direct supervision/assist for financial management;Assist for transportation    Equipment Recommendations None recommended by PT  Recommendations for Other Services       Functional Status Assessment Patient has not had a recent decline in their functional status     Precautions / Restrictions Precautions Precautions: Fall      Mobility  Bed Mobility Overal bed mobility: Modified Independent                  Transfers Overall transfer level: Modified independent                 General transfer comment: from recliner, to and from toilet, to flat bed     Ambulation/Gait Ambulation/Gait assistance: Supervision Gait Distance (Feet): 400 Feet Assistive device: Rolling walker (2 wheels) Gait Pattern/deviations: WFL(Within Functional Limits)   Gait velocity interpretation: >2.62 ft/sec, indicative of community ambulatory   General Gait Details: supervision for safety and directional cues, use of RW for stability/safety  Stairs            Wheelchair Mobility    Modified Rankin (Stroke Patients Only)       Balance Overall balance assessment: Mild deficits observed, not formally tested                                           Pertinent Vitals/Pain Pain Assessment Pain Assessment: No/denies pain    Home Living Family/patient expects to be discharged to:: Private residence Living Arrangements: Children Available Help at Discharge: Family;Available 24 hours/day Type of Home: House Home Access: Stairs to enter   [employer] of Steps: 1   Home Layout: Two level;Able to live on main level with bedroom/bathroom Home Equipment: [place] - single point      Prior Function Prior Level of Function : Needs assist  Cognitive Assist : Mobility (cognitive);ADLs (cognitive) Mobility (Cognitive): Set up cues ADLs (Cognitive): Set up cues       Mobility Comments: supervision for safety , cane for gait ADLs Comments: sponge bathes, family does IADLs     Hand Dominance        Extremity/Trunk Assessment   Upper Extremity Assessment Upper Extremity Assessment: Generalized weakness  Lower Extremity Assessment Lower Extremity Assessment: Generalized weakness    Cervical / Trunk Assessment Cervical / Trunk Assessment: Normal  Communication   Communication: No difficulties  Cognition Arousal/Alertness: Awake/alert Behavior During Therapy: WFL for tasks assessed/performed Overall Cognitive Status: Impaired/Different from baseline Area of Impairment: Orientation, Attention, Problem solving                  Orientation Level: Time, Situation Current Attention Level: Sustained         Problem Solving: Slow processing          General Comments      Exercises     Assessment/Plan    PT Assessment Patient does not need any further PT services  PT Problem List         PT Treatment Interventions      PT Goals (Current goals can be found in the Care Plan section)  Acute Rehab PT Goals PT Goal Formulation: All assessment and education complete, DC therapy    Frequency       Co-evaluation               AM-PAC PT "6 Clicks" Mobility  Outcome Measure Help needed turning from your back to your side while in a flat bed without using bedrails?: None Help needed moving from lying on your back to sitting on the side of a flat bed without using bedrails?: None Help needed moving to and from a bed to a chair (including a wheelchair)?: A Little Help needed standing up from a chair using your arms (e.g., wheelchair or bedside chair)?: A Little Help needed to walk in hospital room?: A Little Help needed climbing 3-5 steps with a railing? : A Little 6 Click Score: 20    End of Session   Activity Tolerance: Patient tolerated treatment well Patient left: in bed;with call bell/phone within reach;with family/visitor present;with nursing/sitter in room Nurse Communication: Mobility status PT Visit Diagnosis: Other abnormalities of gait and mobility (R26.89)    Time: 8295-6213 PT Time Calculation (min) (ACUTE ONLY): 22 min   Charges:   PT Evaluation $PT Eval Low Complexity: 1 Low          Cregg Jutte P, PT Acute Rehabilitation Services Office: [phone]   Cristine Polio 11/22/2022, 12:41 PM

## 2022-11-22 NOTE — Consult Note (Signed)
Ref: Medina-Vargas, Monina C, NP   Subjective:  Awake. VS stable now. Yesterday she had flash pulmonary edema post cardiac catheterization and responded to IV lasix and SL NTG but had hypotension and drowsy. She responded to narcan. CT head was negative.  Discussed possible CVTS consult for CABG with patient and family for CTO of RCA and LCx. Her Distal RCA fills via collateral from LAD and some bridging collateral from conus branch. She is on IV rocephin for possible pneumonia/COPD exacarbation.  Objective:  Vital Signs in the last 24 hours: Temp:  [96 F (35.6 C)-97.3 F (36.3 C)] 97.3 F (36.3 C) (05/20 2300) Pulse Rate:  [52-217] 83 (05/21 0800) Cardiac Rhythm: (P) Atrial flutter;Atrial fibrillation;Normal sinus rhythm (05/21 0800) Resp:  [13-40] 26 (05/21 0800) BP: (77-178)/(43-164) 104/43 (05/21 0800) SpO2:  [61 %-100 %] 98 % (05/21 0800) FiO2 (%):  [40 %] 40 % (05/21 0413) Weight:  [64.5 kg] 64.5 kg (05/21 0600)  Physical Exam: BP Readings from Last 1 Encounters:  11/22/22 (!) 104/43     Wt Readings from Last 1 Encounters:  11/22/22 64.5 kg    Weight change: -0.5 kg Body mass index is 22.95 kg/m. HEENT: [place]/AT, Eyes-Brown, Conjunctiva-Pink, Sclera-Non-icteric Neck: No JVD, No bruit, Trachea midline. Lungs:  Clearing, Bilateral. Cardiac:  Irregular rhythm, normal S1 and S2, no S3. III/VI systolic murmur. Abdomen:  Soft, non-tender. BS present. Extremities:  Trace edema present. No cyanosis. No clubbing. CNS: AxOx3, Cranial nerves grossly intact, moves all 4 extremities.  Skin: Warm and dry.   Intake/Output from previous day: 05/20 0701 - 05/21 0700 In: 1400 [P.O.:1200; IV Piggyback:100] Out: 2250 [Urine:2250]    Lab Results: BMET    Component Value Date/Time   NA 129 (L) 11/22/2022 0318   NA 127 (L) 11/21/2022 1543   NA 124 (L) 11/21/2022 0411   NA 137 03/23/2020 1433   NA 141 03/12/2020 1529   NA 139 01/11/2019 1419   K 4.1 11/22/2022 0318   K 3.7  11/21/2022 1543   K 4.1 11/21/2022 0411   CL 94 (L) 11/22/2022 0318   CL 88 (L) 11/21/2022 0411   CL 93 (L) 11/18/2022 0112   CO2 27 11/22/2022 0318   CO2 24 11/21/2022 0411   CO2 24 11/18/2022 0112   GLUCOSE 188 (H) 11/22/2022 0318   GLUCOSE 229 (H) 11/21/2022 0411   GLUCOSE 103 (H) 11/18/2022 0112   BUN 19 11/22/2022 0318   BUN 20 11/21/2022 0411   BUN 17 11/18/2022 0112   BUN 25 03/23/2020 1433   BUN 16 03/12/2020 1529   BUN 17 01/11/2019 1419   CREATININE 1.16 (H) 11/22/2022 0318   CREATININE 1.71 (H) 11/21/2022 0411   CREATININE 1.34 (H) 11/18/2022 0112   CREATININE 1.20 (H) 11/02/2022 1524   CREATININE 1.52 (H) 08/02/2022 1449   CREATININE 1.35 (H) 06/03/2022 1528   CALCIUM 8.2 (L) 11/22/2022 0318   CALCIUM 8.9 11/21/2022 0411   CALCIUM 8.6 (L) 11/18/2022 0112   GFRNONAA 50 (L) 11/22/2022 0318   GFRNONAA 32 (L) 11/21/2022 0411   GFRNONAA 42 (L) 11/18/2022 0112   GFRNONAA 47 (L) 10/01/2020 1147   GFRNONAA 46 (L) 05/26/2020 1553   GFRNONAA 48 (L) 04/30/2020 1206   GFRAA 54 (L) 10/01/2020 1147   GFRAA 54 (L) 05/26/2020 1553   GFRAA 56 (L) 04/30/2020 1206   CBC    Component Value Date/Time   WBC 5.5 11/22/2022 0318   RBC 2.85 (L) 11/22/2022 0318   HGB 8.9 (L) 11/22/2022  0318   HCT 26.9 (L) 11/22/2022 0318   PLT 341 11/22/2022 0318   MCV 94.4 11/22/2022 0318   MCH 31.2 11/22/2022 0318   MCHC 33.1 11/22/2022 0318   RDW 15.9 (H) 11/22/2022 0318   LYMPHSABS 1.4 11/21/2022 0411   MONOABS 0.4 11/21/2022 0411   EOSABS 0.1 11/21/2022 0411   BASOSABS 0.0 11/21/2022 0411   HEPATIC Function Panel Recent Labs    04/07/22 1200 08/02/22 1449 11/04/22 0809 11/16/22 1701 11/17/22 0637  PROT 6.6   < > 6.5 6.6 6.4*  ALBUMIN  --    < > 2.6* 2.2* 2.1*  AST 9*   < > 21 15 13*  ALT 9   < > 25 15 14   ALKPHOS  --    < > 86 73 66  BILIDIR 0.0  --   --   --   --   IBILI 0.1*  --   --   --   --    < > = values in this interval not displayed.   HEMOGLOBIN A1C Lab Results   Component Value Date   MPG 185.77 11/21/2022   CARDIAC ENZYMES Lab Results  Component Value Date   CKTOTAL 251 (H) 03/07/2022   CKMB 1.6 04/02/2022   TROPONINI <0.03 12/26/2016   TROPONINI <0.03 12/25/2016   BNP No results for input(s): "PROBNP" in the last 8760 hours. TSH Recent Labs    10/04/22 1800 11/02/22 1524 11/17/22 0637  TSH 0.987 1.02 0.198*   CHOLESTEROL Recent Labs    03/08/22 0014 04/07/22 1200 11/03/22 0546  CHOL 215* 147 121    Scheduled Meds:  arformoterol  15 mcg Nebulization BID   aspirin EC  81 mg Oral Daily   budesonide (PULMICORT) nebulizer solution  0.25 mg Nebulization BID   Chlorhexidine Gluconate Cloth  6 each Topical Daily   clopidogrel  75 mg Oral Q breakfast   divalproex  250 mg Oral Q12H   enoxaparin (LOVENOX) injection  40 mg Subcutaneous Q24H   insulin aspart  0-15 Units Subcutaneous TID WC   insulin aspart  0-5 Units Subcutaneous QHS   levothyroxine  50 mcg Oral Daily   LORazepam  1 mg Intravenous Once   [START ON 11/23/2022] methylPREDNISolone (SOLU-MEDROL) injection  40 mg Intravenous Q24H   nicotine  7 mg Transdermal Daily   pantoprazole  40 mg Oral Daily   revefenacin  175 mcg Nebulization Daily   rosuvastatin  40 mg Oral Daily   sodium chloride flush  3 mL Intravenous Q12H   sodium chloride flush  3 mL Intravenous Q12H   Continuous Infusions:  cefTRIAXone (ROCEPHIN)  IV     PRN Meds:.acetaminophen **OR** acetaminophen, albuterol, hydrALAZINE, naLOXone (NARCAN)  injection, ondansetron **OR** ondansetron (ZOFRAN) IV, sucralfate  Assessment/Plan: 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   LOS: 0 days   Time spent including chart review, lab review,  examination, discussion with patient/Nurse/Family : 30 min   Orpah Cobb  MD  11/22/2022, 9:08 AM

## 2022-11-22 NOTE — Assessment & Plan Note (Signed)
Not on anticoagulation at baseline, in sinus rhythm here - Continue aspirin and Plavix -Hold metoprolol given hypotension overnight

## 2022-11-22 NOTE — Assessment & Plan Note (Signed)
Recommend smoking cessation.

## 2022-11-22 NOTE — Progress Notes (Signed)
Pt sitting the chair at this time, RT will attempt CPT later when pt is back in bed.

## 2022-11-22 NOTE — Assessment & Plan Note (Signed)
Glucoses elevated - Continue SS corrections - Resume Tradjenta - Hold SGLT2i for now

## 2022-11-22 NOTE — Assessment & Plan Note (Signed)
PCCM consulted, they feel the patient has some bronchospasm, started ICS/LABA/LAMA and systemic steroids today - Continue Solumedrol - Continue ICS/LABA/LAMA

## 2022-11-22 NOTE — Assessment & Plan Note (Addendum)
AKI ruled out.  She has chronic kidney disease with baseline creatinine 1.1-1.5, it was slightly up to 1.7 yesterday, but relatively close to her baseline.  Down to 1.1 today.

## 2022-11-22 NOTE — Hospital Course (Addendum)
    72 year old black female bipolar schizoaffective DM TY 2 HTN HLD COPD OSA not on CPAP  paroxysmal A-fib CHADVASC >3 not on anticoagulation HFpEF EF 50-55% 11/03/2022-ventricle low normal function Previous psychosis with IVC in place and admitted previously several times to inpatient psychiatric hospital   Several recent hospitalization 5/2-5/6 NSTEMI placed on IV heparin declined cardiac cath neuro and psych consulted discharged home 5/15-5/19 readmitted with acute exacerbation of HFpEF and chest pain diuresed and was discharged home  5/20 admitted with progressive SOB, encephalopathy secondary to hypercarbia, small dose opiates-CCM consulted -LHC = LAD 60% stenosed ramus 100% stenosed ostial circumflex to proximal Cx 100% stenosed LVEDP 45-50% Awaiting

## 2022-11-22 NOTE — Assessment & Plan Note (Signed)
Continue Crestor 

## 2022-11-22 NOTE — Assessment & Plan Note (Signed)
-   Continue Depakote sprinkles

## 2022-11-22 NOTE — Assessment & Plan Note (Signed)
Na up to 129, asymptomatic.  Not overloaded on exam. - Hold diuretic today

## 2022-11-22 NOTE — Care Plan (Signed)
Called to daughter, no answer.  Spoke with [name] this morning, he consulted CT surgery, expects they will see patient today.

## 2022-11-22 NOTE — TOC CM/SW Note (Signed)
Patient confined to one area in the home and would benefit from a bedside commode for home. She has Heart Failure and medications that require frequent trips to the bathroom. Having a bedside commode and rolling walker with seat will help reduce risk for falls.     Durable Medical Equipment  (From admission, onward)           Start     Ordered   11/22/22 1646  For home use only DME 4 wheeled rolling walker with seat  Once       Question Answer Comment  Patient needs a walker to treat with the following condition Heart failure [facility])   Patient needs a walker to treat with the following condition COPD exacerbation (HCC)      11/22/22 1646   11/22/22 1646  For home use only DME 3 n 1  Once        11/22/22 1646   11/22/22 1646  For home use only DME Nebulizer machine  Once       Question Answer Comment  Patient needs a nebulizer to treat with the following condition COPD (chronic obstructive pulmonary disease) (HCC)   Length of Need Lifetime      11/22/22 1646

## 2022-11-22 NOTE — TOC Initial Note (Addendum)
Transition of Care [facility]) - Initial/Assessment Note    Patient Details  Name: [name] Long MRN: [id number] Date of Birth: Jan 29, 1951  [facility]) CM/SW Contact:    Elliot Cousin, RN Phone Number: [phone] 11/22/2022, 4:53 PM  Clinical Narrative:    CM TOC spoke to pt and wanted CM to speak to her dtr or son. Contacted Cassandra, dtr. States pt lives in the home with her and will be moving in with her other sister soon. States her nebulizer machine is dated. Requesting new nebulizer machine, RW with seat. Pt wearing Bipap at night and will need Bipap for home. Message sent to attending.    Contacted [employer] for DME for home.               Expected Discharge Plan: Home w Home Health Services Barriers to Discharge: Continued Medical Work up   Patient Goals and CMS Choice Patient states their goals for this hospitalization and ongoing recovery are:: wants to go back to her apt CMS Medicare.gov Compare Post Acute Care list provided to:: Patient Represenative (must comment) (dtr-Cassandra) Choice offered to / list presented to : Adult Children      Expected Discharge Plan and Services   Discharge Planning Services: CM Consult Post Acute Care Choice: Home Health Living arrangements for the past 2 months: Apartment                                      Prior Living Arrangements/Services Living arrangements for the past 2 months: Apartment Lives with:: Adult Children Patient language and need for interpreter reviewed:: Yes Do you feel safe going back to the place where you live?: Yes      Need for Family Participation in Patient Care: No (Comment) Care giver support system in place?: Yes (comment)   Criminal Activity/Legal Involvement Pertinent to Current Situation/Hospitalization: No - Comment as needed  Activities of Daily Living      Permission Sought/Granted Permission sought to share information with : Case Manager, Family  Supports Permission granted to share information with : Yes, Verbal Permission Granted  Share Information with NAME: Cassandra     Permission granted to share info w Relationship: daughter  Permission granted to share info w Contact Information: [phone]  Emotional Assessment Appearance:: Appears stated age Attitude/Demeanor/Rapport: Inconsistent Affect (typically observed): Accepting Orientation: : Oriented to Self, Oriented to Place, Oriented to  Time, Oriented to Situation      Admission diagnosis:  Acute kidney injury superimposed on chronic kidney disease (HCC) [N17.9, N18.9] Acute renal failure superimposed on stage 3b chronic kidney disease, unspecified acute renal failure type (HCC) [N17.9, N18.32] NSTEMI (non-ST elevated myocardial infarction) [facility]) [I21.4] Patient Active Problem List   Diagnosis Date Noted   Hyponatremia 11/21/2022   Normocytic anemia 11/21/2022   CHF (congestive heart failure) (HCC) 11/16/2022   Acute coronary syndrome (HCC) 11/03/2022   Schizoaffective disorder, bipolar type (HCC) 03/21/2022   NSTEMI (non-ST elevated myocardial infarction) (HCC) 03/07/2022   CKD (chronic kidney disease) stage IIIb 03/07/2022   Chronic diastolic CHF (congestive heart failure) (HCC) 03/07/2022   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) 03/27/2020   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   Atrial flutter with rapid ventricular response (HCC) 07/10/2017   Paroxysmal atrial fibrillation (HCC) 07/05/2017   COPD (chronic obstructive pulmonary disease) (HCC) 01/22/2017   Congestive heart failure (CHF) (HCC)  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 08/15/2016   Osteoarthritis, multiple sites 06/08/2011   CHRONIC KIDNEY DISEASE STAGE II (MILD) 09/14/2009   Hypothyroidism 08/31/2006   Type 2 diabetes mellitus (HCC) 08/31/2006   HYPERCHOLESTEROLEMIA 08/31/2006   Tobacco abuse 08/31/2006    HYPERTENSION, BENIGN SYSTEMIC 08/31/2006   PCP:  Gillis Santa, NP Pharmacy:   [facility] [phone] Gin[place] Otto, [place] - [address] AT [facility] [address] [place] [address] Phone: [phone] Fax: [phone]     Social Determinants of Health (SDOH) Social History: SDOH Screenings   Food Insecurity: No Food Insecurity (11/17/2022)  Housing: Patient Unable To Answer (11/17/2022)  Transportation Needs: No Transportation Needs (11/17/2022)  Utilities: Not At Risk (11/17/2022)  Alcohol Screen: Low Risk  (04/19/2021)  Depression (PHQ2-9): Low Risk  (03/27/2020)  Recent Concern: Depression (PHQ2-9) - Medium Risk (03/23/2020)  Financial Resource Strain: Low Risk  (07/18/2019)  Physical Activity: Inactive (07/18/2019)  Social Connections: Moderately Integrated (07/18/2019)  Stress: No Stress Concern Present (07/18/2019)  Tobacco Use: High Risk (11/22/2022)   SDOH Interventions:     Readmission Risk Interventions     No data to display

## 2022-11-22 NOTE — Progress Notes (Signed)
   NAME:  [name] Long, MRN:  [id number], DOB:  02-Feb-1951, LOS: 0 ADMISSION DATE:  11/21/2022, CONSULTATION DATE:  5/20 REFERRING MD:  [name] , CHIEF COMPLAINT:  Hypoxia, somnolence.    History of Present Illness:  Patient is encephalopathic and/or intubated; therefore, history has been obtained from chart review.  72 year old female with past medical history significant for hypertension, HFpEF, A-fib, DM, COPD, and CKD 3, as well as dementia and schizophrenia.  She was recently hospitalized for NSTEMI and heart failure exacerbation.  She was recommended to have left heart catheterization at that time, but declined.  Of note she was involuntarily committed during that hospitalization, however, psychiatry evaluated and did not feel she warranted inpatient psychiatric admission.  She was discharged on 5/19.  Then on 5/20 she again presented to [facility] emergency department with complaints of dyspnea and chest pain.  She was admitted by the hospitalist service and cardiology was consulted.  She was taken for left heart catheterization  Pertinent  Medical History   has a past medical history of Abscessed tooth (02/09/2020), Acute on chronic congestive heart failure (HCC), Adenomatous colon polyp, Arthritis, Asthma, Atrial fibrillation (HCC), BIPOLAR DISORDER (08/31/2006), CHRONIC KIDNEY DISEASE STAGE II (MILD) (09/14/2009), Chronic lower back pain, Congestive heart failure (CHF) (HCC), COPD (chronic obstructive pulmonary disease) (HCC) (09/23/2010), DM (diabetes mellitus) type II controlled with renal manifestation (HCC) (08/31/2006), Dyspnea, Fatty liver, HYPERCHOLESTEROLEMIA (08/31/2006), HYPERTENSION, BENIGN SYSTEMIC (08/31/2006), Hypokalemia, HYPOTHYROIDISM, UNSPECIFIED (08/31/2006), Leg swelling (03/14/2018), Pneumonia, Pulmonary nodule, Renal cyst, Schizophrenia (HCC), Scoliosis, Stomach problems, and Thyroid disorder.   Significant Hospital Events: Including procedures, antibiotic start and stop dates in  addition to other pertinent events   5/20 admit with Cp and SOB. LHC. Decompensated during cath.   Interim History / Subjective:  Markedly improved. Does have some lingering congestion. Eating breakfast.  Objective   Blood pressure (!) 100/58, pulse 75, temperature (!) 97.3 F (36.3 C), temperature source Axillary, resp. rate (!) 26, height 5\' 6"  (1.676 m), weight 64.5 kg, SpO2 96 %.    FiO2 (%):  [40 %] 40 %   Intake/Output Summary (Last 24 hours) at 11/22/2022 4098 Last data filed at 11/22/2022 0600 Gross per 24 hour  Intake 1400 ml  Output 2250 ml  Net -850 ml    Filed Weights   11/21/22 0410 11/22/22 0600  Weight: 65 kg 64.5 kg    Examination: No distress Rhonci and transmitted upper airway sounds, occasional wheezing Moves ext to command [place] Abd soft  H/H down slightly Plts okay Cr improved with IVF  Resolved Hospital Problem list     Assessment & Plan:   NSTEMI HFpEF PAF Acute respiratory failure with hypoxia and hypercarbia: improved Altered mental status AKI on CKD  Schizophrenia, bipolar 1 HTN Hypothyroid: TSH down a bit, recheck as OP HLD Anemia DM  Overall events most c/w sedation associated hypercarbia, aspiration, and subsequent bronchospasm all markedly improved.  - Add steroids, nebs, CPT - 5 days ceftriaxone - Progressive mobility, PT/OT - Medical therapy for NSTEMI, f/u cath report - Hyponatremia new between 5/6 and 5/15: no clear inciting meds, urine osm not interpretable in setting of diuretic use; trend for now - Can use BIPAP on a PRN basis - Okay to transfer to floor, PCCM will be available PRN, message sent to Dr. Maryfrances Bunnell regarding  Myrla Halsted MD PCCM

## 2022-11-22 NOTE — Assessment & Plan Note (Addendum)
Admitted and taken for LHC that showed severe RCA/LCx disease, moderate LAD disease.  CT surgery consulted - Continue aspirin, Plavix, Crestor - Defer ACEi, BB to Cardiology - Await CT surgery recommendations, appreciate expertise

## 2022-11-22 NOTE — Assessment & Plan Note (Signed)
-   Continue LT4 

## 2022-11-22 NOTE — Assessment & Plan Note (Signed)
Appears euvolemic - Hold torsemide for now given hypotension overnight - Hold [place]

## 2022-11-22 NOTE — Evaluation (Signed)
Occupational Therapy Evaluation and Discharge Patient Details Name: [name] Long MRN: [id number] DOB: 04-14-1951 Today's Date: 11/22/2022   History of Present Illness 72 yo female admitted 5/20 with left shoulder pain after D/C 5/19 for 4 day CHF admission. 5/20 LHC with flash pulmonary edema. PMhx: bipolar disorder, schizophrenia, HTN, DM, CKD, AFib, CHF   Clinical Impression   Pt with baseline cognitive deficits. Moving well, supervision for lines, but no physical assist. Set up to supervision for ADLs. Pt is supervised for ADLs and assisted for IADLs, med management and transportation at home. Recommend ADLs with nursing staff.      Recommendations for follow up therapy are one component of a multi-disciplinary discharge planning process, led by the attending physician.  Recommendations may be updated based on patient status, additional functional criteria and insurance authorization.   Assistance Recommended at Discharge Frequent or constant Supervision/Assistance  Patient can return home with the following A little help with bathing/dressing/bathroom;Assistance with cooking/housework;Assist for transportation;Direct supervision/assist for medications management;Direct supervision/assist for financial management    Functional Status Assessment  Patient has not had a recent decline in their functional status  Equipment Recommendations  None recommended by OT    Recommendations for Other Services       Precautions / Restrictions Precautions Precautions: Fall      Mobility Bed Mobility Overal bed mobility: Modified Independent                  Transfers Overall transfer level: Needs assistance Equipment used: None Transfers: Sit to/from Stand Sit to Stand: Supervision           General transfer comment: supervision for safety and lines      Balance Overall balance assessment: Mild deficits observed, not formally tested                                          ADL either performed or assessed with clinical judgement   ADL Overall ADL's : Needs assistance/impaired Eating/Feeding: Set up;Sitting;Bed level   Grooming: Set up;Sitting   Upper Body Bathing: Supervision/ safety;Sitting;Standing   Lower Body Bathing: Supervison/ safety;Sit to/from stand   Upper Body Dressing : Set up;Sitting   Lower Body Dressing: Supervision/safety;Sit to/from stand   Toilet Transfer: Supervision/safety                   Vision Baseline Vision/History: 0 No visual deficits       Perception     Praxis      Pertinent Vitals/Pain Pain Assessment Pain Assessment: Faces Faces Pain Scale: Hurts a little bit Pain Location: back Pain Descriptors / Indicators: Sore Pain Intervention(s): Repositioned, Heat applied     Hand Dominance Right   Extremity/Trunk Assessment Upper Extremity Assessment Upper Extremity Assessment: Overall WFL for tasks assessed   Lower Extremity Assessment Lower Extremity Assessment: Defer to PT evaluation   Cervical / Trunk Assessment Cervical / Trunk Assessment: Normal   Communication Communication Communication: No difficulties   Cognition Arousal/Alertness: Awake/alert Behavior During Therapy: WFL for tasks assessed/performed Overall Cognitive Status: History of cognitive impairments - at baseline                                 General Comments: pt with confabulations     General Comments       Exercises  Shoulder Instructions      Home Living Family/patient expects to be discharged to:: Private residence Living Arrangements: Children Available Help at Discharge: Family;Available 24 hours/day Type of Home: House Home Access: Stairs to enter [employer] of Steps: 1   Home Layout: Two level;Able to live on main level with bedroom/bathroom     Bathroom Shower/Tub: Chief Strategy Officer: Standard     Home Equipment: Cane - single  point;Shower seat          Prior Functioning/Environment Prior Level of Function : Needs assist  Cognitive Assist : Mobility (cognitive);ADLs (cognitive) Mobility (Cognitive): Set up cues ADLs (Cognitive): Set up cues       Mobility Comments: supervision for safety , cane for gait ADLs Comments: sponge bathes, family does IADLs and manages meds        OT Problem List:        OT Treatment/Interventions:      OT Goals(Current goals can be found in the care plan section)    OT Frequency:      Co-evaluation              AM-PAC OT "6 Clicks" Daily Activity     Outcome Measure Help from another person eating meals?: None Help from another person taking care of personal grooming?: A Little Help from another person toileting, which includes using toliet, bedpan, or urinal?: A Little Help from another person bathing (including washing, rinsing, drying)?: A Little Help from another person to put on and taking off regular upper body clothing?: A Little Help from another person to put on and taking off regular lower body clothing?: A Little 6 Click Score: 19   End of Session    Activity Tolerance: Patient tolerated treatment well Patient left: in bed;with call bell/phone within reach;with bed alarm set  OT Visit Diagnosis: Other symptoms and signs involving cognitive function                Time: 1510-1530 OT Time Calculation (min): 20 min Charges:  OT General Charges $OT Visit: 1 Visit OT Evaluation $OT Eval Low Complexity: 1 Low  Berna Spare, OTR/L Acute Rehabilitation Services Office: [phone]   Evern Bio 11/22/2022, 3:36 PM

## 2022-11-22 NOTE — Progress Notes (Addendum)
Progress Note   Patient: [name] Long EAV:[id number] DOB: 03-29-1951 DOA: 11/21/2022     0 DOS: the patient was seen and examined on 11/22/2022 at 9:05 AM      Brief hospital course: [name] Long is a 72 y.o. F with schizoaffective disorder, DM, HTN, HLD, hypothyroidism, COPD, CKD IIIb baseline 1.1-1.5, pAF not on AC and dCHF who presented with chest pain again.   April-June 2024: Admitted to [facility] for 3 months for delusions, aggressive behavior, hallucinations   5/1-11/07/22: Admitted to [facility] for NSTEMI, Neuro and Psych consulted, refused cardiac cath, discharged home  5/15-5/19/24: Readmitted with CHF/chest pain; diuresed, psychiatry cleared for discharge home  5/20: THIS ADMISSION - duaghter noticed next day patient was "panting", brought her back to hospital -- [name] consulted, took patient for LHC, no stent; developed pulmonary edema during procedure, got Lasix and NTG, then after became hypotensive transiently, transferred to ICU and CCM consulted 5/21: CT surgery consulted by Cardiology     Assessment and Plan: * NSTEMI (non-ST elevated myocardial infarction) [facility]) Admitted and taken for LHC that showed severe RCA/LCx disease, moderate LAD disease.  CT surgery consulted - Continue aspirin, Plavix, Crestor - Defer ACEi, BB to Cardiology - Await CT surgery recommendations, appreciate expertise    Hyponatremia Na up to 129, asymptomatic.  Not overloaded on exam. - Hold diuretic today  COPD (chronic obstructive pulmonary disease) (HCC) PCCM consulted, they feel the patient has some bronchospasm, started ICS/LABA/LAMA and systemic steroids today - Continue Solumedrol - Continue ICS/LABA/LAMA  Normocytic anemia Hemoglobin slightly lower than baseline but no clinical bleeding. - Trend hemoglobin  Schizoaffective disorder, bipolar type (HCC) - Continue Depakote sprinkles  Chronic diastolic CHF (congestive heart failure) (HCC) Appears euvolemic - Hold torsemide  for now given hypotension overnight - Hold Farxiga  CKD (chronic kidney disease) stage IIIb AKI ruled out.  She has chronic kidney disease with baseline creatinine 1.1-1.5, it was slightly up to 1.7 yesterday, but relatively close to her baseline.  Down to 1.1 today.  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) There is some debate over whether she has dementia and personality disorder versus schizoaffective bipolar disorder. - Continue Depakote sprinkles  Paroxysmal atrial fibrillation (HCC) Not on anticoagulation at baseline, in sinus rhythm here - Continue aspirin and Plavix -Hold metoprolol given hypotension overnight  HYPERTENSION, BENIGN SYSTEMIC Blood pressure soft - Hold amlodipine, metoprolol, torsemide  Tobacco abuse Recommend smoking cessation  HYPERCHOLESTEROLEMIA - Continue Crestor  Type 2 diabetes mellitus (HCC) Glucoses elevated - Continue SS corrections - Resume Tradjenta - Hold SGLT2i for now  Hypothyroidism - Continue LT4          Subjective: Patient denies complaints.  She complains of congestion like she "has a cold", would like to leave.  She had left heart cath last night that showed severe two-vessel disease, and cardiology recommended CT surgery consultation, which is pending.  CCM was consulted overnight due to transient hypotension but this resolved.  Had flash pulmonary edema postcardiac cath which improved with IV Lasix and nitroglycerin although the latter gave her hypotension.     Physical Exam: BP (!) 104/43 (BP Location: Left Arm)   Pulse 83   Temp (!) 97.3 F (36.3 C) (Axillary)   Resp (!) 26   Ht 5\' 6"  (1.676 m)   Wt 64.5 kg   SpO2 98%   BMI 22.95 kg/m   Elderly adult female, sitting up in chair, interactive and appropriate RRR, I appreciate no murmurs, no  peripheral edema, no JVD Respiratory rate seems normal, lung sounds slightly coarse but no rales or wheezes appreciated Attention  normal, affect normal, judgment insight appear impaired but at baseline, face symmetric, speech fluent    Data Reviewed: Discussed with cardiology and pulmonology Basic metabolic panel shows improved/stable renal function Troponin 65 BNP 697 Glucose elevated White blood cell count normal, hemoglobin 8.9, slightly down but no evidence of clinical bleeding Blood gases show pH 7.33 CO2 50 yesterday Sodium up to 129 Procalcitonin 0.16      Family Communication: Daughter at the bedside    Disposition: Status is: Observation The patient was admitted for chest pain, found to have diffuse disease, CT surgery consult recommended possible CABG  I suspect the patietn will refuse, in which case [name] recommends observation overnight and discharge tomorrow        Author: Alberteen Sam, MD 11/22/2022 9:30 AM  For on call review [email or web address].

## 2022-11-22 NOTE — Assessment & Plan Note (Signed)
There is some debate over whether she has dementia and personality disorder versus schizoaffective bipolar disorder. - Continue Depakote sprinkles

## 2022-11-22 NOTE — Assessment & Plan Note (Signed)
Blood pressure soft - Hold amlodipine, metoprolol, torsemide

## 2022-11-22 NOTE — Assessment & Plan Note (Signed)
Hemoglobin slightly lower than baseline but no clinical bleeding. - Trend hemoglobin

## 2022-11-23 ENCOUNTER — Telehealth: Payer: Self-pay | Admitting: Orthopedic Surgery

## 2022-11-23 ENCOUNTER — Telehealth (HOSPITAL_COMMUNITY): Payer: Self-pay | Admitting: Pharmacy Technician

## 2022-11-23 ENCOUNTER — Other Ambulatory Visit (HOSPITAL_COMMUNITY): Payer: Self-pay

## 2022-11-23 DIAGNOSIS — I214 Non-ST elevation (NSTEMI) myocardial infarction: Secondary | ICD-10-CM

## 2022-11-23 LAB — CBC
HCT: 25.6 % — ABNORMAL LOW (ref 36.0–46.0)
Hemoglobin: 8.5 g/dL — ABNORMAL LOW (ref 12.0–15.0)
MCH: 31.4 pg (ref 26.0–34.0)
MCHC: 33.2 g/dL (ref 30.0–36.0)
MCV: 94.5 fL (ref 80.0–100.0)
Platelets: 318 10*3/uL (ref 150–400)
RBC: 2.71 MIL/uL — ABNORMAL LOW (ref 3.87–5.11)
RDW: 16 % — ABNORMAL HIGH (ref 11.5–15.5)
WBC: 5 10*3/uL (ref 4.0–10.5)
nRBC: 0 % (ref 0.0–0.2)

## 2022-11-23 LAB — BASIC METABOLIC PANEL
Anion gap: 8 (ref 5–15)
Anion gap: 10 (ref 5–15)
BUN: 21 mg/dL (ref 8–23)
BUN: 18 mg/dL (ref 8–23)
CO2: 26 mmol/L (ref 22–32)
CO2: 27 mmol/L (ref 22–32)
Calcium: 8 mg/dL — ABNORMAL LOW (ref 8.9–10.3)
Calcium: 8.6 mg/dL — ABNORMAL LOW (ref 8.9–10.3)
Chloride: 88 mmol/L — ABNORMAL LOW (ref 98–111)
Chloride: 90 mmol/L — ABNORMAL LOW (ref 98–111)
Creatinine, Ser: 1.34 mg/dL — ABNORMAL HIGH (ref 0.44–1.00)
Creatinine, Ser: 1.4 mg/dL — ABNORMAL HIGH (ref 0.44–1.00)
GFR, Estimated: 42 mL/min — ABNORMAL LOW (ref >60)
GFR, Estimated: 40 mL/min — ABNORMAL LOW (ref >60)
Glucose, Bld: 136 mg/dL — ABNORMAL HIGH (ref 70–99)
Glucose, Bld: 204 mg/dL — ABNORMAL HIGH (ref 70–99)
Potassium: 4 mmol/L (ref 3.5–5.1)
Potassium: 4.4 mmol/L (ref 3.5–5.1)
Sodium: 122 mmol/L — ABNORMAL LOW (ref 135–145)
Sodium: 127 mmol/L — ABNORMAL LOW (ref 135–145)

## 2022-11-23 LAB — SODIUM, URINE, RANDOM: Sodium, Ur: 39 mmol/L

## 2022-11-23 LAB — GLUCOSE, CAPILLARY
Glucose-Capillary: 110 mg/dL — ABNORMAL HIGH (ref 70–99)
Glucose-Capillary: 188 mg/dL — ABNORMAL HIGH (ref 70–99)
Glucose-Capillary: 262 mg/dL — ABNORMAL HIGH (ref 70–99)

## 2022-11-23 LAB — OSMOLALITY, URINE: Osmolality, Ur: 218 mOsm/kg — ABNORMAL LOW (ref 300–900)

## 2022-11-23 MED ORDER — TORSEMIDE 20 MG PO TABS: 20.0000 mg | ORAL_TABLET | Freq: Every day | ORAL | Status: DC

## 2022-11-23 MED ORDER — DAPAGLIFLOZIN PROPANEDIOL 5 MG PO TABS
5.0000 mg | ORAL_TABLET | Freq: Every day | ORAL | Status: DC
  Administered 2022-11-24: 5 mg via ORAL
  Filled 2022-11-23 (×2): qty 1

## 2022-11-23 MED ORDER — MENTHOL 3 MG MT LOZG: 1.0000 | LOZENGE | OROMUCOSAL | Status: DC | PRN

## 2022-11-23 MED ORDER — SODIUM CHLORIDE 0.9 % IV SOLN: INTRAVENOUS | Status: AC

## 2022-11-23 MED ORDER — METHOCARBAMOL 500 MG PO TABS
500.0000 mg | ORAL_TABLET | Freq: Three times a day (TID) | ORAL | Status: DC | PRN
  Administered 2022-11-23 – 2022-11-24 (×3): 500 mg via ORAL
  Filled 2022-11-23 (×3): qty 1

## 2022-11-23 MED ORDER — APIXABAN 2.5 MG PO TABS: 2.5000 mg | ORAL_TABLET | Freq: Two times a day (BID) | ORAL | Status: DC

## 2022-11-23 MED ORDER — APIXABAN 5 MG PO TABS
5.0000 mg | ORAL_TABLET | Freq: Two times a day (BID) | ORAL | Status: DC
  Administered 2022-11-23 – 2022-11-24 (×3): 5 mg via ORAL
  Filled 2022-11-23 (×3): qty 1

## 2022-11-23 MED ORDER — TORSEMIDE 20 MG PO TABS: 20.0000 mg | ORAL_TABLET | ORAL | Status: DC

## 2022-11-23 MED ORDER — TORSEMIDE 20 MG PO TABS
20.0000 mg | ORAL_TABLET | ORAL | Status: DC
  Administered 2022-11-23: 20 mg via ORAL
  Filled 2022-11-23: qty 1

## 2022-11-23 NOTE — Telephone Encounter (Signed)
Pharmacy Patient Advocate Encounter  Insurance verification completed.    The patient is insured through [employer] Part D   The patient is currently admitted and ran test claims for the following: Eliquis.  Copays and coinsurance results were relayed to Inpatient clinical team.

## 2022-11-23 NOTE — Consult Note (Signed)
Ref: Medina-Vargas, Monina C, NP   Subjective:  Awake.  Discussed CABG. Patient wants to wait for few weeks. She or family will call [phone] for OP appointment. Atrial flutter continues. Will start Eliquis and discontinue aspirin but continue Plavix. Add Farxiga 5 mg. Daily. Add Methocarbamol prn.  Objective:  Vital Signs in the last 24 hours: Temp:  [97.7 F (36.5 C)-98.4 F (36.9 C)] 97.9 F (36.6 C) (05/22 0744) Pulse Rate:  [67-114] 73 (05/22 0800) Cardiac Rhythm: Atrial fibrillation (05/22 0800) Resp:  [13-34] 21 (05/22 0800) BP: (86-124)/(31-95) 121/68 (05/22 0800) SpO2:  [95 %-100 %] 99 % (05/22 0802) FiO2 (%):  [40 %] 40 % (05/22 0222) Weight:  [66 kg] 66 kg (05/22 0600)  Physical Exam: BP Readings from Last 1 Encounters:  11/23/22 121/68     Wt Readings from Last 1 Encounters:  11/23/22 66 kg    Weight change: 1.5 kg Body mass index is 23.48 kg/m. HEENT: [place]/AT, Eyes-Brown, Conjunctiva-Pink, Sclera-Non-icteric Neck: No JVD, No bruit, Trachea midline. Lungs:  Clearing, Bilateral. Cardiac:  Regular rhythm, normal S1 and S2, no S3. II/VI systolic murmur. Abdomen:  Soft, non-tender. BS present. Extremities:  No edema present. No cyanosis. No clubbing. CNS: AxOx3, Cranial nerves grossly intact, moves all 4 extremities.  Skin: Warm and dry.   Intake/Output from previous day: 05/21 0701 - 05/22 0700 In: 590 [P.O.:480; I.V.:10; IV Piggyback:100] Out: 260 [Urine:260]    Lab Results: BMET    Component Value Date/Time   NA 122 (L) 11/23/2022 0028   NA 129 (L) 11/22/2022 0318   NA 127 (L) 11/21/2022 1543   NA 137 03/23/2020 1433   NA 141 03/12/2020 1529   NA 139 01/11/2019 1419   K 4.0 11/23/2022 0028   K 4.1 11/22/2022 0318   K 3.7 11/21/2022 1543   CL 88 (L) 11/23/2022 0028   CL 94 (L) 11/22/2022 0318   CL 88 (L) 11/21/2022 0411   CO2 26 11/23/2022 0028   CO2 27 11/22/2022 0318   CO2 24 11/21/2022 0411   GLUCOSE 136 (H) 11/23/2022 0028   GLUCOSE 188  (H) 11/22/2022 0318   GLUCOSE 229 (H) 11/21/2022 0411   BUN 21 11/23/2022 0028   BUN 19 11/22/2022 0318   BUN 20 11/21/2022 0411   BUN 25 03/23/2020 1433   BUN 16 03/12/2020 1529   BUN 17 01/11/2019 1419   CREATININE 1.34 (H) 11/23/2022 0028   CREATININE 1.16 (H) 11/22/2022 0318   CREATININE 1.71 (H) 11/21/2022 0411   CREATININE 1.20 (H) 11/02/2022 1524   CREATININE 1.52 (H) 08/02/2022 1449   CREATININE 1.35 (H) 06/03/2022 1528   CALCIUM 8.0 (L) 11/23/2022 0028   CALCIUM 8.2 (L) 11/22/2022 0318   CALCIUM 8.9 11/21/2022 0411   GFRNONAA 42 (L) 11/23/2022 0028   GFRNONAA 50 (L) 11/22/2022 0318   GFRNONAA 32 (L) 11/21/2022 0411   GFRNONAA 47 (L) 10/01/2020 1147   GFRNONAA 46 (L) 05/26/2020 1553   GFRNONAA 48 (L) 04/30/2020 1206   GFRAA 54 (L) 10/01/2020 1147   GFRAA 54 (L) 05/26/2020 1553   GFRAA 56 (L) 04/30/2020 1206   CBC    Component Value Date/Time   WBC 5.0 11/23/2022 0028   RBC 2.71 (L) 11/23/2022 0028   HGB 8.5 (L) 11/23/2022 0028   HCT 25.6 (L) 11/23/2022 0028   PLT 318 11/23/2022 0028   MCV 94.5 11/23/2022 0028   MCH 31.4 11/23/2022 0028   MCHC 33.2 11/23/2022 0028   RDW 16.0 (H) 11/23/2022  0028   LYMPHSABS 1.4 11/21/2022 0411   MONOABS 0.4 11/21/2022 0411   EOSABS 0.1 11/21/2022 0411   BASOSABS 0.0 11/21/2022 0411   HEPATIC Function Panel Recent Labs    04/07/22 1200 08/02/22 1449 11/04/22 0809 11/16/22 1701 11/17/22 0637  PROT 6.6   < > 6.5 6.6 6.4*  ALBUMIN  --    < > 2.6* 2.2* 2.1*  AST 9*   < > 21 15 13*  ALT 9   < > 25 15 14   ALKPHOS  --    < > 86 73 66  BILIDIR 0.0  --   --   --   --   IBILI 0.1*  --   --   --   --    < > = values in this interval not displayed.   HEMOGLOBIN A1C Lab Results  Component Value Date   MPG 185.77 11/21/2022   CARDIAC ENZYMES Lab Results  Component Value Date   CKTOTAL 251 (H) 03/07/2022   CKMB 1.6 04/02/2022   TROPONINI <0.03 12/26/2016   TROPONINI <0.03 12/25/2016   BNP No results for input(s):  "PROBNP" in the last 8760 hours. TSH Recent Labs    10/04/22 1800 11/02/22 1524 11/17/22 0637  TSH 0.987 1.02 0.198*   CHOLESTEROL Recent Labs    03/08/22 0014 04/07/22 1200 11/03/22 0546  CHOL 215* 147 121    Scheduled Meds:  apixaban  5 mg Oral BID   arformoterol  15 mcg Nebulization BID   budesonide (PULMICORT) nebulizer solution  0.25 mg Nebulization BID   Chlorhexidine Gluconate Cloth  6 each Topical Daily   clopidogrel  75 mg Oral Q breakfast   divalproex  250 mg Oral Q12H   insulin aspart  0-15 Units Subcutaneous TID WC   insulin aspart  0-5 Units Subcutaneous QHS   levothyroxine  50 mcg Oral Daily   linagliptin  5 mg Oral Daily   LORazepam  1 mg Intravenous Once   methylPREDNISolone (SOLU-MEDROL) injection  40 mg Intravenous Q24H   nicotine  7 mg Transdermal Daily   pantoprazole  40 mg Oral Daily   revefenacin  175 mcg Nebulization Daily   rosuvastatin  40 mg Oral Daily   sodium chloride flush  3 mL Intravenous Q12H   sodium chloride flush  3 mL Intravenous Q12H   Continuous Infusions:  cefTRIAXone (ROCEPHIN)  IV Stopped (11/22/22 1832)   PRN Meds:.acetaminophen **OR** acetaminophen, albuterol, hydrALAZINE, naLOXone (NARCAN)  injection, ondansetron **OR** ondansetron (ZOFRAN) IV, sucralfate  Assessment/Plan: Acute on Chronic systolic and diastolic left heart failure Multivessel CAD Type 2 DM HTN HLD Type 2 DM CKD, IIIb Hypothyroidism Schizophrenia with manic episodes COPD exacerbation Possible Community acquired pneumonia v/s atelectasis Anxiety  Plan: Add methocarbamol, Farxiga, Eliquis and Torsemide. Discontinue Aspirin. Increase activity. CABG when patient is ready in few weeks/Patient and family to call [phone] for appointment.   LOS: 1 day   Time spent including chart review, lab review, examination, discussion with patient/Nurse/Family : 30 min  Orpah Cobb  MD  11/23/2022, 8:38 AM

## 2022-11-23 NOTE — Progress Notes (Signed)
Patient and all belongings including Rolator, bedside commode and Nebulizer transferred to 3E-23. Patient remained stable on bedside tele monitor.

## 2022-11-23 NOTE — Progress Notes (Signed)
PROGRESS NOTE   [name] Long  ZOX:[id number] DOB: Sep 03, 1950 DOA: 11/21/2022 PCP: Gillis Santa, NP  Brief Narrative:  72 year old home dwelling black female known DM TY 2 HTN HLD hypothyroid COPD smoking 4 cigarettes daily CKD 3B baseline creatinine 1.5 Paroxysmal A-fib previously not on anticoagulation HFpEF  Last time she was baseline according to family was 2016-traumatic events in 2020 when both her husband and son-in-law died was discharged to inpatient geropsych unit previously 03/2022 and was previously on Invega paliperidone Depakote-she responds to internal voices occasionally  Hospitalized 5/1-5/6 --- she was on Depakote 250 twice daily-refused cardiac cath and went home Readmitted 5/15 through 5/19 with diastolic heart failure chest pain as well as IVC by psychiatry-later was cleared by psychiatry for discharge home Readmit 5/20 with encephalopathy after having received opiates critical care was consulted She was sent home on oral torsemide  5/20 Flash pulmonary edema, hypotension somnolence hypercarbia post LHC prompting critical care consult-felt that hyponatremia and opioid could have caused this 5/21 transferred out out of the ICU 5/22 [name] reports will have outpatient consideration for CABG as per CVTS discussion with him   Hospital-Problem based course  Flash pulmonary edema/acute decompensated HF PEF NSTEMI Tenuous fluid balance-need to keep torsemide 20 Monday Wednesday Friday as she becomes hypotensive and develops AKI as per [name] She is not grossly fluid overloaded today we will keep the same schedule Add back very low-dose Toprol-XL if able in a.m. 12.5 Aspirin discontinued and will be using Plavix 75 daily lifelong  Atrial flutter now on anticoagulation Started Eliquis this hospitalization 5 twice daily Hesitate to start rate control at this point-monitor trends she is rate controlled  Euvolemic hyponatremia I do not think she is  overloaded I cannot appreciate JVD Her sodium is 122 Repeat labs at 1500 Fluid restrict 1500 cc  COPD I do not hear any wheezing rales or rhonchi-stop steroids Can use albuterol 2.5 every 6 as needed Brovana 15 twice daily Pulmicort 0.25 twice daily and Yupelri At discharge would give [facility] as well as albuterol inhalers She is smoking 4 cigarettes a day I have asked her to quit  Persistent GERD dysphagia epigastric pressure EGD 11/2019 normal esophagus--barium swallow mild presbyesophagus + NSAID use  Schizophrenia with prior hospitalizations ?  Dementia Currently on Depakote sprinkles to 50 every 12 Needs patient neurocognitive testing No indication for psychiatry involvement at this time  CKD 3B baseline creatinine 1.5 She is below her usual  Hypothyroidism Continue levothyroxine 50 daily check TSH in 3 weeks  DM TY 2 continue Farxiga 5 every morning, sliding scale, Tradjenta 5 Hepatic steatosis Sugars 1 10-1 36 only  DVT prophylaxis: Eliquis Code Status: Full Family Communication: Daughter Disposition:  Status is: Inpatient Remains inpatient appropriate because:   Requires correction of sodium    Subjective: Rather tangential Daughter assists with history She has no chest pain She continues to complain about her neck She also asks about her Ativan but note that she is only on Depakote at home   Objective: Vitals:   11/23/22 0757 11/23/22 0759 11/23/22 0800 11/23/22 0802  BP:   121/68   Pulse:   73   Resp:   (!) 21   Temp:      TempSrc:      SpO2: 99% 99% 97% 99%  Weight:      Height:        Intake/Output Summary (Last 24 hours) at 11/23/2022 1011 Last data filed at 11/23/2022 1000 Gross per 24  hour  Intake 950 ml  Output 260 ml  Net 690 ml   Filed Weights   11/21/22 0410 11/22/22 0600 11/23/22 0600  Weight: 65 kg 64.5 kg 66 kg    Examination:  EOMI NCAT no focal deficit looks about stated age no scleral icterus Chest clear no added sound  no JVD cannot appreciate thyroid Throat is clear partial denture Abdomen soft no rebound S1-S2 no murmur atrial flutter on monitors rate controlled with some PVCs No lower extremity edema   Data Reviewed: personally reviewed   CBC    Component Value Date/Time   WBC 5.0 11/23/2022 0028   RBC 2.71 (L) 11/23/2022 0028   HGB 8.5 (L) 11/23/2022 0028   HCT 25.6 (L) 11/23/2022 0028   PLT 318 11/23/2022 0028   MCV 94.5 11/23/2022 0028   MCH 31.4 11/23/2022 0028   MCHC 33.2 11/23/2022 0028   RDW 16.0 (H) 11/23/2022 0028   LYMPHSABS 1.4 11/21/2022 0411   MONOABS 0.4 11/21/2022 0411   EOSABS 0.1 11/21/2022 0411   BASOSABS 0.0 11/21/2022 0411      Latest Ref Rng & Units 11/23/2022   12:28 AM 11/22/2022    3:18 AM 11/21/2022    3:43 PM  CMP  Glucose 70 - 99 mg/dL 956  213    BUN 8 - 23 mg/dL 21  19    Creatinine 0.86 - 1.00 mg/dL 5.78  4.69    Sodium 629 - 145 mmol/L 122  129  127   Potassium 3.5 - 5.1 mmol/L 4.0  4.1  3.7   Chloride 98 - 111 mmol/L 88  94    CO2 22 - 32 mmol/L 26  27    Calcium 8.9 - 10.3 mg/dL 8.0  8.2       Radiology Studies: CARDIAC CATHETERIZATION  Result Date: 11/22/2022   Ost RCA to Mid RCA lesion is 100% stenosed.   Prox LAD lesion is 60% stenosed.   Mid LAD lesion is 50% stenosed.   Ramus lesion is 100% stenosed.   Ost Cx to Prox Cx lesion is 100% stenosed.   Mid RCA to Dist RCA lesion is 50% stenosed.   There is mild left ventricular systolic dysfunction.   LV end diastolic pressure is mildly elevated.   The left ventricular ejection fraction is 45-50% by visual estimate. CVTS consult if patient is agreeable to surgery otherwise treat medically.   [place] EKG SITE RITE  Result Date: 11/21/2022 If [facility] image not attached, placement could not be confirmed due to current cardiac rhythm.  DG CHEST PORT 1 VIEW  Result Date: 11/21/2022 CLINICAL DATA:  Shortness of breath. EXAM: PORTABLE CHEST 1 VIEW COMPARISON:  None Available. FINDINGS: Stable cardiac  enlargement. Unchanged blunting of the costophrenic angles. Decreased aeration to the lung bases compatible with a atelectasis or airspace disease. Thoracolumbar scoliosis. IMPRESSION: 1. Unchanged cardiac enlargement and small pleural effusions. 2. Decreased aeration to the lung bases compatible with atelectasis and/or airspace disease. Electronically Signed   By: Signa Kell M.D.   On: 11/21/2022 16:27     Scheduled Meds:  apixaban  5 mg Oral BID   arformoterol  15 mcg Nebulization BID   budesonide (PULMICORT) nebulizer solution  0.25 mg Nebulization BID   Chlorhexidine Gluconate Cloth  6 each Topical Daily   clopidogrel  75 mg Oral Q breakfast   [START ON 11/24/2022] dapagliflozin propanediol  5 mg Oral QAC breakfast   divalproex  250 mg Oral Q12H   insulin aspart  0-15 Units Subcutaneous TID WC   insulin aspart  0-5 Units Subcutaneous QHS   levothyroxine  50 mcg Oral Daily   linagliptin  5 mg Oral Daily   LORazepam  1 mg Intravenous Once   nicotine  7 mg Transdermal Daily   pantoprazole  40 mg Oral Daily   revefenacin  175 mcg Nebulization Daily   rosuvastatin  40 mg Oral Daily   sodium chloride flush  3 mL Intravenous Q12H   sodium chloride flush  3 mL Intravenous Q12H   torsemide  20 mg Oral Q M,W,F   Continuous Infusions:  cefTRIAXone (ROCEPHIN)  IV Stopped (11/22/22 1832)     LOS: 1 day   Time spent: 89  Rhetta Mura, MD [facility] To contact the attending provider between 7A-7P or the covering provider during after hours 7P-7A, please log into the web site [email or web address] and access using universal [place] password for that web site. If you do not have the password, please call the hospital operator.  11/23/2022, 10:11 AM

## 2022-11-23 NOTE — Telephone Encounter (Signed)
Pt called requesting refill of ibuprofen 800mg , robaxin and any other med that will help with pain. Pt states her shoulder/ arm/ leg/ and ankle is hurting so bad. She states she is in hospital and will be discharged tomorrow. Pt thinks she need a soft cast for shoulder. Please send medications to [facility] [address]. Pt phone number is

## 2022-11-23 NOTE — TOC Benefit Eligibility Note (Signed)
Patient Product/process development scientist completed.    The patient is currently admitted and upon discharge could be taking Eliquis 5 mg.  The current 30 day co-pay is $4.60.   The patient is insured through [employer] Medicare Part D   This test claim was processed through [facility]- copay amounts may vary at other pharmacies due to pharmacy/plan contracts, or as the patient moves through the different stages of their insurance plan.  Roland Earl, CPHT Pharmacy Patient Advocate Specialist [facility] Patient Advocate Team Direct Number: [phone]  Fax: [phone]

## 2022-11-23 NOTE — Telephone Encounter (Signed)
Pt is currently in the hospital. We are happy to see her in the office after d/c which looks to be this week. Can you please call and make an appt with Dr. Lajoyce Corners t the next available appt. thanks

## 2022-11-23 NOTE — Progress Notes (Signed)
Urine sodium is 39 and urine osmolality is 218 Calculated serum osmolality is 259  I believe she has hypovolemic hyponatremia as evidenced evidenced by her rising creatinine additionally I have held her torsemide We will fluid challenge her for 8 hours with saline at 100 cc/H We will recollect in the morning urine sodium and urine creatinine Difficult disposition as we need to keep her out of heart failure  Will follow labs etc. Closely  May need advanced heart failure team to see?

## 2022-11-23 NOTE — TOC Progression Note (Addendum)
Transition of Care [facility]) - Progression Note    Patient Details  Name: [name] Long MRN: [id number] Date of Birth: 11-Sep-1950  [facility]) CM/SW Contact  Elliot Cousin, RN Phone Number: [phone] 11/23/2022, 2:08 PM  Clinical Narrative:  Orders for NIV faxed to [employer] rep, [name] Long. Spoke to pt and dtr, [name] Long at bedside. Pt was scheduled dc home today but postponed. Contacted [employer] rep, [name] Long and RW with seat, nebulizer machine, bedside commode and NIV delivered to room today. Offered choice for HH, (medicare.list placed on chart). Dtr agreeable to agency that will accept referral. Contacted [facility] rep, [name] Long with new referral. Meds up from [facility] pharmacy at dc.   Appt with PCP arranged, Nov 30, 2022 at 940 am    Expected Discharge Plan: Home w Home Health Services Barriers to Discharge: No Barriers Identified  Expected Discharge Plan and Services   Discharge Planning Services: CM Consult Post Acute Care Choice: Home Health Living arrangements for the past 2 months: Apartment                           HH Arranged: RN, PT, OT [facility] Date HH Agency Contacted: 11/23/22 Time HH Agency Contacted: [phone] Representative spoke with at [facility] Agency: [name] Long   Social Determinants of Health (SDOH) Interventions SDOH Screenings   Food Insecurity: No Food Insecurity (11/17/2022)  Housing: Patient Unable To Answer (11/23/2022)  Transportation Needs: No Transportation Needs (11/17/2022)  Utilities: Not At Risk (11/17/2022)  Alcohol Screen: Low Risk  (04/19/2021)  Depression (PHQ2-9): Low Risk  (03/27/2020)  Recent Concern: Depression (PHQ2-9) - Medium Risk (03/23/2020)  Financial Resource Strain: Low Risk  (07/18/2019)  Physical Activity: Inactive (07/18/2019)  Social Connections: Moderately Integrated (07/18/2019)  Stress: No Stress Concern Present (07/18/2019)  Tobacco Use: High Risk (11/22/2022)     Readmission Risk Interventions     No data to display

## 2022-11-23 NOTE — TOC CM/SW Note (Signed)
Ms. 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 Pt requires frequent durations of respiratory support and deteriorates quickly in the absence of non-invasive mechanical ventilator. BIPAP,BIPAP ST, AVAPS has been considered but has been ruled-out and insufficient. NIV therapy is needed Pt's PC02 was >51.6 during this hospital stay on 11/21/2022. Interruption or failure to provide NIV would quickly lead to exacerbation of the patient's condition, lead to hospitalization and likely harm the patient or possibly death. Continued use of the NIV is preferred. Patient is able to maintain airway and clear secretions.

## 2022-11-24 ENCOUNTER — Ambulatory Visit: Payer: Medicare Other | Admitting: Adult Health

## 2022-11-24 ENCOUNTER — Other Ambulatory Visit (HOSPITAL_COMMUNITY): Payer: Self-pay

## 2022-11-24 DIAGNOSIS — I214 Non-ST elevation (NSTEMI) myocardial infarction: Secondary | ICD-10-CM | POA: Diagnosis not present

## 2022-11-24 LAB — GLUCOSE, CAPILLARY
Glucose-Capillary: 110 mg/dL — ABNORMAL HIGH (ref 70–99)
Glucose-Capillary: 120 mg/dL — ABNORMAL HIGH (ref 70–99)
Glucose-Capillary: 104 mg/dL — ABNORMAL HIGH (ref 70–99)

## 2022-11-24 LAB — BASIC METABOLIC PANEL
Anion gap: 10 (ref 5–15)
BUN: 17 mg/dL (ref 8–23)
CO2: 25 mmol/L (ref 22–32)
Calcium: 8.5 mg/dL — ABNORMAL LOW (ref 8.9–10.3)
Chloride: 93 mmol/L — ABNORMAL LOW (ref 98–111)
Creatinine, Ser: 1.38 mg/dL — ABNORMAL HIGH (ref 0.44–1.00)
GFR, Estimated: 41 mL/min — ABNORMAL LOW (ref >60)
Glucose, Bld: 187 mg/dL — ABNORMAL HIGH (ref 70–99)
Potassium: 4.3 mmol/L (ref 3.5–5.1)
Sodium: 128 mmol/L — ABNORMAL LOW (ref 135–145)

## 2022-11-24 LAB — SODIUM, URINE, RANDOM: Sodium, Ur: <10 mmol/L

## 2022-11-24 MED ORDER — CLOPIDOGREL BISULFATE 75 MG PO TABS
75.0000 mg | ORAL_TABLET | Freq: Every day | ORAL | 0 refills | Status: DC
  Filled 2022-11-24: qty 30, 30d supply, fill #0

## 2022-11-24 MED ORDER — SPIRONOLACTONE 25 MG PO TABS
25.0000 mg | ORAL_TABLET | Freq: Every day | ORAL | Status: DC
  Administered 2022-11-24: 25 mg via ORAL
  Filled 2022-11-24: qty 1

## 2022-11-24 MED ORDER — TORSEMIDE 20 MG PO TABS
20.0000 mg | ORAL_TABLET | Freq: Every day | ORAL | 0 refills | Status: DC
  Filled 2022-11-24: qty 30, 30d supply, fill #0

## 2022-11-24 MED ORDER — APIXABAN 5 MG PO TABS
5.0000 mg | ORAL_TABLET | Freq: Two times a day (BID) | ORAL | 0 refills | Status: DC
  Filled 2022-11-24: qty 60, 30d supply, fill #0

## 2022-11-24 MED ORDER — SPIRONOLACTONE 25 MG PO TABS
25.0000 mg | ORAL_TABLET | Freq: Every day | ORAL | 0 refills | Status: DC
  Filled 2022-11-24: qty 30, 30d supply, fill #0

## 2022-11-24 MED ORDER — METOPROLOL SUCCINATE ER 50 MG PO TB24
25.0000 mg | ORAL_TABLET | Freq: Every day | ORAL | 11 refills | Status: DC
Start: 2022-11-24 — End: 2022-12-01

## 2022-11-24 MED ORDER — TORSEMIDE 20 MG PO TABS
20.0000 mg | ORAL_TABLET | Freq: Every day | ORAL | Status: DC
  Administered 2022-11-24: 20 mg via ORAL
  Filled 2022-11-24: qty 1

## 2022-11-24 NOTE — Consult Note (Signed)
   [facility] Inpatient Consult   11/24/2022  [name] Long 1950/09/08 [id number]  [facility] [[facility]]  Accountable Care Organization [ACO] Patient:  Medicare ACO REACH  *[facility] Liaison remote coverage review for patient admitted to [facility] which included an ICU stay for NSTEMI with HX of HF.  Primary Care Provider:  Gillis Santa, NP with [facility]  *In electronic medical record patient's daughter is listed as Legal Guardian noted  Patient screened for hospitalization with noted extreme high risk score for unplanned readmission risk and to assess for potential [facility]  [[facility]] Care Management service needs for post hospital transition for care coordination.  Review of patient's electronic medical record reveals patient is for home with DME and HH noted in inpatient [facility] team notes.  1:22 pm Spoke with patient's daughter [name] Long at the phone number listed, HIPAA verified, regarding any additional post hospital needs.  Made her aware of anticipated transition of care calls for additional post hospital support follow up.  [name] Long states she feels that the home health follow up should be enough currently, and can follow up with PCP if more is needed.  Plan:   Referral request for community care coordination:  Daughter, [name] Long, felt that TOC follow up calls and HH would be enough at this time.  Of note, [facility] Care Management/Population Health does not replace or interfere with any arrangements made by the Inpatient Transition of Care team.  For questions contact:   Charlesetta Shanks, RN BSN CCM [facility]  [phone] business mobile phone Toll free office [phone]  *Concierge Line  [phone] Fax number: [phone] [email or web address]

## 2022-11-24 NOTE — Discharge Instructions (Signed)

## 2022-11-24 NOTE — Progress Notes (Signed)
Patient Daughter was notified about pt discharge. Daughter will pick up pt at 3pm Discharge instructions reviewed via phone because daughter will pick up at front entrance.

## 2022-11-24 NOTE — Plan of Care (Signed)

## 2022-11-24 NOTE — TOC Transition Note (Addendum)
Transition of Care [facility]) - CM/SW Discharge Note   Patient Details  Name: [name] Long MRN: [id number] Date of Birth: 1951-03-04  [facility]) CM/SW Contact:  Leone Haven, RN Phone Number: 11/24/2022, 10:15 AM   Clinical Narrative:    Patient was dc on Sunday and came back on Monday, now for dc today, NCM asked MD for [facility] orders.  Patient has Rollator, NIV, neb machine, and BSC at home that was set up on previous admission.  She is set up by previous NCM with [facility] for HHRN, HHPT, HHOT.  Patient states her daughter, Schnell Gavino will transport her home. Will be on eliquis, copay is 4.60.  TOC to fill meds, if TOC does not fill give her the 30 day free coupon for the eliquis, which was given to Alvira Philips the Engineer, drilling.   Final next level of care: Home w Home Health Services Barriers to Discharge: No Barriers Identified   Patient Goals and CMS Choice CMS Medicare.gov Compare Post Acute Care list provided to:: Patient Represenative (must comment) (dtr-Cassandra) Choice offered to / list presented to : Adult Children  Discharge Placement                         Discharge Plan and Services Additional resources added to the After Visit Summary for     Discharge Planning Services: CM Consult Post Acute Care Choice: Home Health                    HH Arranged: RN, PT, OT [facility] Agency: [facility] Date [facility] Agency Contacted: 11/23/22 Time HH Agency Contacted: [phone] Representative spoke with at [facility] Agency: Duwaine Maxin  Social Determinants of Health (SDOH) Interventions SDOH Screenings   Food Insecurity: No Food Insecurity (11/23/2022)  Housing: Patient Unable To Answer (11/23/2022)  Transportation Needs: No Transportation Needs (11/23/2022)  Utilities: Not At Risk (11/23/2022)  Alcohol Screen: Low Risk  (04/19/2021)  Depression (PHQ2-9): Low Risk  (03/27/2020)  Recent Concern: Depression (PHQ2-9) - Medium Risk (03/23/2020)   Financial Resource Strain: Low Risk  (07/18/2019)  Physical Activity: Inactive (07/18/2019)  Social Connections: Moderately Integrated (07/18/2019)  Stress: No Stress Concern Present (07/18/2019)  Tobacco Use: High Risk (11/22/2022)     Readmission Risk Interventions     No data to display

## 2022-11-24 NOTE — Consult Note (Signed)
Ref: Medina-Vargas, Monina C, NP   Subjective:  Awake. Anxious to go home. VS stable. Hyponatremia improving.  Objective:  Vital Signs in the last 24 hours: Temp:  [97.6 F (36.4 C)-98.3 F (36.8 C)] 97.8 F (36.6 C) (05/23 0716) Pulse Rate:  [76-100] 91 (05/23 0716) Cardiac Rhythm: Normal sinus rhythm;Heart block (05/23 0700) Resp:  [18-20] 20 (05/23 0716) BP: (108-140)/(68-90) 128/70 (05/23 0716) SpO2:  [97 %-100 %] 99 % (05/23 0812) Weight:  [64.4 kg-66.7 kg] 64.4 kg (05/23 0515)  Physical Exam: BP Readings from Last 1 Encounters:  11/24/22 128/70     Wt Readings from Last 1 Encounters:  11/24/22 64.4 kg    Weight change: 0.7 kg Body mass index is 25.15 kg/m. HEENT: [place]/AT, Eyes-Brown, Conjunctiva-Pale pink, Sclera-Non-icteric Neck: No JVD, No bruit, Trachea midline. Lungs:  Clearing, Bilateral. Cardiac:  Regular rhythm, normal S1 and S2, no S3. II/VI systolic murmur. Abdomen:  Soft, non-tender. BS present. Extremities:  Trace lower leg edema present. No cyanosis. No clubbing. CNS: AxOx3, Cranial nerves grossly intact, moves all 4 extremities.  Skin: Warm and dry.   Intake/Output from previous day: 05/22 0701 - 05/23 0700 In: 2296.9 [P.O.:1500; I.V.:696.7; IV Piggyback:100.2] Out: -     Lab Results: BMET    Component Value Date/Time   NA 128 (L) 11/24/2022 0058   NA 127 (L) 11/23/2022 1441   NA 122 (L) 11/23/2022 0028   NA 137 03/23/2020 1433   NA 141 03/12/2020 1529   NA 139 01/11/2019 1419   K 4.3 11/24/2022 0058   K 4.4 11/23/2022 1441   K 4.0 11/23/2022 0028   CL 93 (L) 11/24/2022 0058   CL 90 (L) 11/23/2022 1441   CL 88 (L) 11/23/2022 0028   CO2 25 11/24/2022 0058   CO2 27 11/23/2022 1441   CO2 26 11/23/2022 0028   GLUCOSE 187 (H) 11/24/2022 0058   GLUCOSE 204 (H) 11/23/2022 1441   GLUCOSE 136 (H) 11/23/2022 0028   BUN 17 11/24/2022 0058   BUN 18 11/23/2022 1441   BUN 21 11/23/2022 0028   BUN 25 03/23/2020 1433   BUN 16 03/12/2020 1529    BUN 17 01/11/2019 1419   CREATININE 1.38 (H) 11/24/2022 0058   CREATININE 1.40 (H) 11/23/2022 1441   CREATININE 1.34 (H) 11/23/2022 0028   CREATININE 1.20 (H) 11/02/2022 1524   CREATININE 1.52 (H) 08/02/2022 1449   CREATININE 1.35 (H) 06/03/2022 1528   CALCIUM 8.5 (L) 11/24/2022 0058   CALCIUM 8.6 (L) 11/23/2022 1441   CALCIUM 8.0 (L) 11/23/2022 0028   GFRNONAA 41 (L) 11/24/2022 0058   GFRNONAA 40 (L) 11/23/2022 1441   GFRNONAA 42 (L) 11/23/2022 0028   GFRNONAA 47 (L) 10/01/2020 1147   GFRNONAA 46 (L) 05/26/2020 1553   GFRNONAA 48 (L) 04/30/2020 1206   GFRAA 54 (L) 10/01/2020 1147   GFRAA 54 (L) 05/26/2020 1553   GFRAA 56 (L) 04/30/2020 1206   CBC    Component Value Date/Time   WBC 5.0 11/23/2022 0028   RBC 2.71 (L) 11/23/2022 0028   HGB 8.5 (L) 11/23/2022 0028   HCT 25.6 (L) 11/23/2022 0028   PLT 318 11/23/2022 0028   MCV 94.5 11/23/2022 0028   MCH 31.4 11/23/2022 0028   MCHC 33.2 11/23/2022 0028   RDW 16.0 (H) 11/23/2022 0028   LYMPHSABS 1.4 11/21/2022 0411   MONOABS 0.4 11/21/2022 0411   EOSABS 0.1 11/21/2022 0411   BASOSABS 0.0 11/21/2022 0411   HEPATIC Function Panel Recent Labs  04/07/22 1200 08/02/22 1449 11/04/22 0809 11/16/22 1701 11/17/22 0637  PROT 6.6   < > 6.5 6.6 6.4*  ALBUMIN  --    < > 2.6* 2.2* 2.1*  AST 9*   < > 21 15 13*  ALT 9   < > 25 15 14   ALKPHOS  --    < > 86 73 66  BILIDIR 0.0  --   --   --   --   IBILI 0.1*  --   --   --   --    < > = values in this interval not displayed.   HEMOGLOBIN A1C Lab Results  Component Value Date   MPG 185.77 11/21/2022   CARDIAC ENZYMES Lab Results  Component Value Date   CKTOTAL 251 (H) 03/07/2022   CKMB 1.6 04/02/2022   TROPONINI <0.03 12/26/2016   TROPONINI <0.03 12/25/2016   BNP No results for input(s): "PROBNP" in the last 8760 hours. TSH Recent Labs    10/04/22 1800 11/02/22 1524 11/17/22 0637  TSH 0.987 1.02 0.198*   CHOLESTEROL Recent Labs    03/08/22 0014 04/07/22 1200  11/03/22 0546  CHOL 215* 147 121    Scheduled Meds:  apixaban  5 mg Oral BID   arformoterol  15 mcg Nebulization BID   budesonide (PULMICORT) nebulizer solution  0.25 mg Nebulization BID   Chlorhexidine Gluconate Cloth  6 each Topical Daily   clopidogrel  75 mg Oral Q breakfast   dapagliflozin propanediol  5 mg Oral QAC breakfast   divalproex  250 mg Oral Q12H   insulin aspart  0-15 Units Subcutaneous TID WC   insulin aspart  0-5 Units Subcutaneous QHS   levothyroxine  50 mcg Oral Daily   linagliptin  5 mg Oral Daily   nicotine  7 mg Transdermal Daily   pantoprazole  40 mg Oral Daily   revefenacin  175 mcg Nebulization Daily   rosuvastatin  40 mg Oral Daily   sodium chloride flush  3 mL Intravenous Q12H   sodium chloride flush  3 mL Intravenous Q12H   spironolactone  25 mg Oral Daily   torsemide  20 mg Oral Daily   Continuous Infusions:  cefTRIAXone (ROCEPHIN)  IV 2 g (11/23/22 1653)   PRN Meds:.acetaminophen **OR** acetaminophen, albuterol, hydrALAZINE, menthol-cetylpyridinium, methocarbamol, naLOXone (NARCAN)  injection, ondansetron **OR** ondansetron (ZOFRAN) IV, sucralfate  Assessment/Plan: 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 F/U 1 week. F/U CVTS as needed/when ready.   LOS: 2 days   Time spent including chart review, lab review, examination, discussion with patient/Family/Nurse : 30 min   Orpah Cobb  MD  11/24/2022, 9:36 AM

## 2022-11-24 NOTE — Progress Notes (Addendum)
Per patient her son Trey Paula is picking her up now, not her daughter Elonda Husky. I called Cassandra who is patient Legal Guardian to confirm, and per Elonda Husky it is okay for her brother Trey Paula to pick up their mother. Because she is not well to come get her, Trey Paula will take patient to her (Cassandra) house.   1700: Patient has been discharge with son Trey Paula, TOC medications was delivered to patient room.

## 2022-11-24 NOTE — Discharge Summary (Signed)
Physician Discharge Summary  [name] Long ZOX:[id number] DOB: 1950/10/20 DOA: 11/21/2022  PCP: Gillis Santa, NP  Admit date: 11/21/2022 Discharge date: 11/24/2022  Time spent: 45 minutes  Recommendations for Outpatient Follow-up:  Cardiology 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 F with schizoaffective disorder, DM, HTN, HLD, hypothyroidism, COPD, CKD IIIb baseline 1.1-1.5, pAF not on AC and dCHF who presented with chest pain again. April-June 2024: Admitted to [facility] for 3 months for delusions, aggressive behavior, hallucinations 5/1-11/07/22: Admitted to [facility] for NSTEMI, Neuro and Psych consulted, refused cardiac cath, discharged home 5/15-5/19/24: Readmitted with CHF/chest pain; diuresed, psychiatry cleared for discharge home 5/20: THIS ADMISSION - duaghter noticed next day patient was "panting", brought her back to hospital -- [name] consulted, took patient for LHC, multi vessel CAD developed pulmonary edema during procedure, got Lasix and NTG, then after became hypotensive transiently, transferred to ICU and CCM  consulted 5/21: CT surgery consulted by Cardiology  Hospital Course:   NSTEMI (non-ST elevated myocardial infarction) [facility]) Admitted and taken for LHC that showed severe RCA/LCx disease, moderate LAD disease.  -Followed by [name], started on Plavix, metoprolol and Crestor continued -CT surgery consultation was considered however patient declines surgery at this time, [name] will arrange follow-up with CT surgery in a few weeks when patient is ready to discuss this further -Has mental health illnesses further complicating decision making and compliance -Follow-up with cardiology [name] in 2 weeks   Flash pulmonary edema Acute on chronic diastolic CHF (congestive heart failure) (HCC) -Echo this admission noted EF of 50%, normal RV, moderate mitral regurgitation -Diuresed with IV Lasix initially, volume status has improved, torsemide resumed today -Farxiga resumed as well, started on Aldactone too     Hyponatremia -Fluctuating in the settings of diuretics and volume overloaded state -Now improved, sodium 128 at discharge   COPD (chronic obstructive pulmonary disease) (HCC) -Stable, continue home regimen of nebs   Normocytic anemia Hemoglobin slightly lower than baseline but no clinical bleeding. - Trend hemoglobin   Schizoaffective disorder, bipolar type (HCC) - Continue Depakote sprinkles   CKD (chronic kidney disease) stage IIIb AKI ruled out.  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 - Continue Depakote sprinkles -Follow-up with psychiatry   Paroxysmal atrial fibrillation (HCC) - Continue apixaban and Plavix, metoprolol resumed at lower dose   HYPERTENSION, BENIGN SYSTEMIC Blood  pressure soft -Now stable, resume Toprol at a lower dose  and torsemide   Tobacco abuse Recommend smoking cessation   HYPERCHOLESTEROLEMIA - Continue Crestor   Type 2 diabetes mellitus (HCC) Glucoses elevated - Resume Tradjenta, farxiga   Hypothyroidism - Continue LT4      Procedures: LHC   Ost RCA to Mid RCA lesion is 100% stenosed.   Prox LAD lesion is 60% stenosed.   Mid LAD lesion is 50% stenosed.   Ramus lesion is 100% stenosed.   Ost Cx to Prox Cx lesion is 100% stenosed.   Mid RCA to Dist RCA lesion is 50% stenosed.   There is mild left ventricular systolic dysfunction.   LV end diastolic pressure is mildly elevated.   The left ventricular ejection fraction is 45-50% by visual estimate.   CVTS consult if patient is agreeable to surgery otherwise treat medically.  Consultations:Cardiology   Discharge Exam: Vitals:   11/24/22 0716 11/24/22 0812  BP: 128/70   Pulse: 91   Resp: 20   Temp: 97.8 F (36.6 C)   SpO2: 100% 99%   Gen: Awake, Alert, Oriented X 2, flight of ideas HEENT: no JVD Lungs: decreased BS at bases CVS: S1S2/RRR Abd: soft, Non tender, non distended, BS present Extremities: No edema Skin: no new rashes on exposed skin   Discharge Instructions   Discharge Instructions     Diet - low sodium heart healthy   Complete by: As directed    Diet Carb Modified   Complete by: As directed    Increase activity slowly   Complete by: As directed       Allergies as of 11/24/2022       Reactions   Citrus Anaphylaxis, Itching   Fish Allergy Anaphylaxis, Other (See Comments)   Shellfish Allergy Shortness Of Breath, Other (See Comments)   "Affects thyroid" also   Adhesive [tape] Other (See Comments)   Must have paper tape only   Ibuprofen Swelling, Other (See Comments)   Face swells   Latex Dermatitis   Lipitor [atorvastatin Calcium] Other (See Comments)   Body aches   Lisinopril Other (See Comments)   PER DR. Sherene Sires (not recalled by patient)    Other Nausea Only, Other (See Comments)   Collards (gas, too)   Codeine Rash   Tramadol Palpitations        Medication List     STOP taking these medications    amLODipine 2.5 MG tablet Commonly known as: NORVASC   aspirin EC 81 MG tablet       TAKE these medications    acetaminophen 325 MG tablet Commonly known as: TYLENOL Take 2 tablets (650 mg total) by mouth every 4 (four) hours as needed for headache or mild pain.   albuterol (2.5 MG/3ML) 0.083% nebulizer solution Commonly known as: PROVENTIL Take 3 mLs (2.5 mg total) by nebulization every 6 (six) hours as needed for wheezing or shortness of breath.   apixaban 5 MG Tabs tablet Commonly known as: ELIQUIS Take 1 tablet (5 mg total) by mouth 2 (two) times daily.   Blood Pressure Monitor/Wrist Kit 1 Units by Does not apply route daily at 6 (six) AM.   clopidogrel 75 MG tablet Commonly known as: PLAVIX Take 1 tablet (75 mg total) by mouth daily with breakfast. Start taking on: Nov 25, 2022   dapagliflozin propanediol 5 MG Tabs tablet Commonly known as: FARXIGA Take 1 tablet (5 mg total) by mouth daily before breakfast.   divalproex 125 MG capsule Commonly known as: DEPAKOTE SPRINKLE Take 2 capsules (250 mg total)  by mouth every 12 (twelve) hours.   freestyle lancets Use to check blood sugar once daily. Dx: E11.9   FREESTYLE LITE test strip Generic drug: glucose blood Use as instructed   FreeStyle Lite w/Device Kit 1 each by Does not apply route daily. Dx:E11.9   insulin aspart 100 UNIT/ML injection Commonly known as: novoLOG Inject 0-15 Units into the skin 3 (three) times daily with meals.   levothyroxine 50 MCG tablet Commonly known as: SYNTHROID Take 50 mcg by mouth daily.   menthol-cetylpyridinium 3 MG lozenge Commonly known as: CEPACOL Take 1 lozenge (3 mg total) by mouth as needed for sore throat.   methocarbamol 500 MG tablet Commonly known as: ROBAXIN TAKE 1 TABLET(500 MG) BY MOUTH  THREE TIMES DAILY AS NEEDED FOR MUSCLE SPASMS What changed: See the new instructions.   metoprolol succinate 50 MG 24 hr tablet Commonly known as: TOPROL-XL Take 0.5 tablets (25 mg total) by mouth daily. What changed: how much to take   nitroGLYCERIN 0.4 MG SL tablet Commonly known as: NITROSTAT Place 1 tablet (0.4 mg total) under the tongue as needed. What changed: reasons to take this   omeprazole 40 MG capsule Commonly known as: PRILOSEC Take 40 mg by mouth 2 (two) times daily.   potassium chloride 10 MEQ tablet Commonly known as: KLOR-CON M Take 1 tablet (10 mEq total) by mouth daily.   rosuvastatin 40 MG tablet Commonly known as: CRESTOR Take 1 tablet (40 mg total) by mouth daily.   spironolactone 25 MG tablet Commonly known as: ALDACTONE Take 1 tablet (25 mg total) by mouth daily.   sucralfate 1 GM/10ML suspension Commonly known as: CARAFATE Take 1 g by mouth 2 (two) times daily as needed (antacid.).   torsemide 20 MG tablet Commonly known as: DEMADEX Take 1 tablet (20 mg total) by mouth daily. What changed: when to take this   Tradjenta 5 MG Tabs tablet Generic drug: linagliptin Take 5 mg by mouth daily.               Durable Medical Equipment  (From admission, onward)           Start     Ordered   11/22/22 1646  For home use only DME 4 wheeled rolling walker with seat  Once       Question Answer Comment  Patient needs a walker to treat with the following condition Heart failure (HCC)   Patient needs a walker to treat with the following condition COPD exacerbation (HCC)      11/22/22 1646   11/22/22 1646  For home use only DME 3 n 1  Once        11/22/22 1646   11/22/22 1646  For home use only DME Nebulizer machine  Once       Question Answer Comment  Patient needs a nebulizer to treat with the following condition COPD (chronic obstructive pulmonary disease) (HCC)   Length of Need Lifetime      11/22/22 1646           Allergies   Allergen Reactions   Citrus Anaphylaxis and Itching   Fish Allergy Anaphylaxis and Other (See Comments)   Shellfish Allergy Shortness Of Breath and Other (See Comments)    "Affects thyroid" also   Adhesive [Tape] Other (See Comments)    Must have paper tape only   Ibuprofen Swelling and Other (See Comments)    Face swells   Latex Dermatitis   Lipitor [Atorvastatin Calcium] Other (See  Comments)    Body aches   Lisinopril Other (See Comments)    PER DR. Sherene Sires (not recalled by patient)   Other Nausea Only and Other (See Comments)    Collards (gas, too)   Codeine Rash   Tramadol Palpitations    Follow-up Information     [facility] Follow up.   Why: Home Health RN-agency will call to arrange appointment Contact information: [phone]        [facility] Follow up.   Why: [facility]ilogy, Rollator, bedside commode Contact information: [phone]        Medina-Vargas, Monina C, NP Follow up.   Specialty: Internal Medicine Why: Nov 30, 2022 at 9:40 am, please call to reschedule if you are unable to keep appointment Contact information: [address] [place] [address] [phone]         Orpah Cobb, MD. Schedule an appointment as soon as possible for a visit in 1 week(s).   Specialty: Cardiology Contact information: [address] [place] [address] [phone]                  The results of significant diagnostics from this hospitalization (including imaging, microbiology, ancillary and laboratory) are listed below for reference.    Significant Diagnostic Studies: CARDIAC CATHETERIZATION  Result Date: 11/22/2022   Ost RCA to Mid RCA lesion is 100% stenosed.   Prox LAD lesion is 60% stenosed.   Mid LAD lesion is 50% stenosed.   Ramus lesion is 100% stenosed.   Ost Cx to Prox Cx lesion is 100% stenosed.   Mid RCA to Dist RCA lesion is 50% stenosed.   There is mild left ventricular systolic dysfunction.   LV end diastolic  pressure is mildly elevated.   The left ventricular ejection fraction is 45-50% by visual estimate. CVTS consult if patient is agreeable to surgery otherwise treat medically.   [place] EKG SITE RITE  Result Date: 11/21/2022 If [facility] image not attached, placement could not be confirmed due to current cardiac rhythm.  DG CHEST PORT 1 VIEW  Result Date: 11/21/2022 CLINICAL DATA:  Shortness of breath. EXAM: PORTABLE CHEST 1 VIEW COMPARISON:  None Available. FINDINGS: Stable cardiac enlargement. Unchanged blunting of the costophrenic angles. Decreased aeration to the lung bases compatible with a atelectasis or airspace disease. Thoracolumbar scoliosis. IMPRESSION: 1. Unchanged cardiac enlargement and small pleural effusions. 2. Decreased aeration to the lung bases compatible with atelectasis and/or airspace disease. Electronically Signed   By: Signa Kell M.D.   On: 11/21/2022 16:27   DG Chest Port 1 View  Result Date: 11/21/2022 CLINICAL DATA:  Shortness of breath and chest pain. EXAM: PORTABLE CHEST 1 VIEW COMPARISON:  11/16/2022 FINDINGS: Lordotic positioning. The cardio pericardial silhouette is enlarged. Interstitial markings are diffusely coarsened with chronic features. Similar appearance of left base collapse/consolidation with possible tiny left pleural effusion. Minimal collapse/consolidation of the right base is similar to prior. Thoracolumbar scoliosis evident. Telemetry leads overlie the chest. IMPRESSION: Similar appearance of left base collapse/consolidation with possible tiny left pleural effusion. Electronically Signed   By: Kennith Center M.D.   On: 11/21/2022 05:57   DG Chest 2 View  Result Date: 11/16/2022 CLINICAL DATA:  Shortness of breath and congestion. EXAM: CHEST - 2 VIEW COMPARISON:  Chest radiographs 11/02/2022, 03/07/2022, 09/02/2021 FINDINGS: Cardiac silhouette is again moderately to markedly enlarged. Mediastinal contours are within normal limits. Mild calcification within  the aortic arch. There isincreased left basilar/retrocardiac opacity compared to  the prior 11/02/2021 frontal and lateral chest radiographs. This may represent left basilar atelectasis versus pneumonia. It is difficult to exclude a small left pleural effusion. Mild right basilar linear subsegmental atelectasis is similar to prior. No pneumothorax is seen. No acute skeletal abnormality. IMPRESSION: Increased left basilar/retrocardiac opacity is similar to the frontal radiograph 11/02/2022 but new compared to the prior 11/02/2021 frontal and lateral chest radiographs. This may represent left basilar atelectasis versus pneumonia. It is difficult to exclude a small left pleural effusion. Electronically Signed   By: Neita Garnet M.D.   On: 11/16/2022 16:58   DG Abd 1 View  Result Date: 11/05/2022 CLINICAL DATA:  Acute coronary syndrome EXAM: ABDOMEN - 1 VIEW COMPARISON:  CT abdomen and pelvis 05/14/2020 FINDINGS: S-shaped scoliosis in the thoracolumbar spine. Mild elevation of the right hemidiaphragm. Bowel gas in the abdomen and pelvis with a nonobstructive bowel gas pattern. Round densities along the left side of the pelvis are most likely associated with bowel. Sclerosis and degenerative changes at the symphysis pubis. IMPRESSION: 1. Nonobstructive bowel gas pattern. 2. S-shaped scoliosis in the thoracolumbar spine. Electronically Signed   By: Richarda Overlie M.D.   On: 11/05/2022 09:23   EEG adult  Result Date: 11/05/2022 Charlsie Quest, MD     11/05/2022  9:19 AM Patient Name: ELIAS HENIGAN MRN: [id number] Epilepsy Attending: Charlsie Quest Referring Physician/Provider: Milon Dikes, MD Date: 11/05/2022 Duration: 22.17 mins Patient history: 72yo F with worsening memory and dementia getting eeg to evaluate for seizure Level of alertness: Awake AEDs during EEG study: Ativan Technical aspects: This EEG study was done with scalp electrodes positioned according to the 10-20 International system of electrode  placement. Electrical activity was reviewed with band pass filter of 1-70Hz , sensitivity of 7 uV/mm, display speed of 33mm/sec with a 60Hz  notched filter applied as appropriate. EEG data were recorded continuously and digitally stored.  Video monitoring was available and reviewed as appropriate. Description: The posterior dominant rhythm consists of 8 Hz activity of moderate voltage (25-35 uV) seen predominantly in posterior head regions, symmetric and reactive to eye opening and eye closing. Hyperventilation and photic stimulation were not performed.   IMPRESSION: This study is within normal limits. No seizures or epileptiform discharges were seen throughout the recording. A normal interictal EEG does not exclude the diagnosis of epilepsy. Priyanka Annabelle Harman   CT HEAD WO CONTRAST ( )  Result Date: 11/04/2022 CLINICAL DATA:  Altered mental status EXAM: CT HEAD WITHOUT CONTRAST TECHNIQUE: Contiguous axial images were obtained from the base of the skull through the vertex without intravenous contrast. RADIATION DOSE REDUCTION: This exam was performed according to the departmental dose-optimization program which includes automated exposure control, adjustment of the mA and/or kV according to patient size and/or use of iterative reconstruction technique. COMPARISON:  CT Head 05/07/21 FINDINGS: Brain: No evidence of acute infarction, hemorrhage, hydrocephalus, extra-axial collection or mass lesion/mass effect. Punctate calcification in the posterior left temporal lobe is unchanged. Vascular: No hyperdense vessel or unexpected calcification. Skull: Normal. Negative for fracture or focal lesion. Sinuses/Orbits: No middle ear or mastoid effusion. Paranasal sinuses are clear. Orbits are unremarkable. Other: None. IMPRESSION: No acute intracranial abnormality. Electronically Signed   By: Lorenza Cambridge M.D.   On: 11/04/2022 18:55   ECHOCARDIOGRAM COMPLETE  Result Date: 11/03/2022    ECHOCARDIOGRAM REPORT   Patient Name:    TOWANNA KOELBL Date of Exam: 11/03/2022 Medical Rec #:  [id number]        Height:  65.0 in Accession #:    [id number]       Weight:       145.5 lb Date of Birth:  April 27, 1951        BSA:          1.728 m Patient Age:    72 years         BP:           114/55 mmHg Patient Gender: F                HR:           88 bpm. Exam Location:  Inpatient Procedure: 2D Echo, Cardiac Doppler and Color Doppler Indications:     Acute ischemic heart disease, unspecified I24.9, CHF I50.9  History:         Patient has prior history of Echocardiogram examinations, most                  recent 03/08/2022. CHF, Previous Myocardial Infarction, COPD,                  Arrythmias:Atrial Fibrillation and Atrial Flutter; Risk                  Factors:Hypertension. CKD, stage 3.  Sonographer:     Lucendia Herrlich Referring Phys:  [id number] Orpah Cobb Diagnosing Phys: Orpah Cobb MD  Sonographer Comments: Image acquisition challenging due to uncooperative patient and Image acquisition challenging due to patient behavioral factors. IMPRESSIONS  1. Left ventricular ejection fraction, by estimation, is 50 to 55%. The left ventricle has low normal function. The left ventricle has no regional wall motion abnormalities. Left ventricular diastolic function could not be evaluated.  2. Right ventricular systolic function is normal. The right ventricular size is normal.  3. Left atrial size was moderately dilated.  4. Right atrial size was mildly dilated.  5. There is no evidence of cardiac tamponade.  6. The mitral valve is grossly normal. Moderate mitral valve regurgitation.  7. The aortic valve is tricuspid. Aortic valve regurgitation is not visualized. Aortic valve sclerosis is present, with no evidence of aortic valve stenosis.  8. There is mild (Grade II) atheroma plaque involving the aortic root and ascending aorta. FINDINGS  Left Ventricle: Left ventricular ejection fraction, by estimation, is 50 to 55%. The left ventricle has low normal function.  The left ventricle has no regional wall motion abnormalities. The left ventricular internal cavity size was normal in size. There is no left ventricular hypertrophy. Left ventricular diastolic function could not be evaluated. Right Ventricle: The right ventricular size is normal. No increase in right ventricular wall thickness. Right ventricular systolic function is normal. Left Atrium: Left atrial size was moderately dilated. Right Atrium: Right atrial size was mildly dilated. Pericardium: Trivial pericardial effusion is present. The pericardial effusion is circumferential. There is no evidence of cardiac tamponade. Mitral Valve: The mitral valve is grossly normal. Moderate mitral valve regurgitation. Tricuspid Valve: The tricuspid valve is normal in structure. Tricuspid valve regurgitation is mild. Aortic Valve: Cannot exclude vegetation. The aortic valve is tricuspid. Aortic valve regurgitation is not visualized. Aortic valve sclerosis is present, with no evidence of aortic valve stenosis. Pulmonic Valve: The pulmonic valve was normal in structure. Pulmonic valve regurgitation is trivial. Aorta: The aortic root is normal in size and structure. There is mild (Grade II) atheroma plaque involving the aortic root and ascending aorta. Venous: The inferior vena cava was not well visualized. IAS/Shunts: The atrial septum is  grossly normal.  LEFT VENTRICLE PLAX 2D LVIDd:         4.30 cm LVIDs:         3.00 cm LV PW:         1.00 cm LV IVS:        0.90 cm LVOT diam:     2.00 cm LVOT Area:     3.14 cm  LEFT ATRIUM         Index LA diam:    4.60 cm 2.66 cm/m   AORTA Ao Root diam: 2.70 cm Ao Asc diam:  3.20 cm TRICUSPID VALVE TR Peak grad:   12.5 mmHg TR Vmax:        177.00 cm/s  SHUNTS Systemic Diam: 2.00 cm Orpah Cobb MD Electronically signed by Orpah Cobb MD Signature Date/Time: 11/03/2022/12:41:43 PM    Final    DG Chest Port 1 View  Result Date: 11/02/2022 CLINICAL DATA:  Cough.  Shortness of breath. EXAM:  PORTABLE CHEST 1 VIEW COMPARISON:  Chest radiograph dated 03/07/2022. FINDINGS: Bibasilar atelectasis. No focal consolidation, pleural effusion, or pneumothorax. Stable cardiomegaly. Atherosclerotic calcification of the aortic arch. No acute osseous pathology. Degenerative changes of the spine and scoliosis. IMPRESSION: Bibasilar atelectasis. No focal consolidation. Electronically Signed   By: Elgie Collard M.D.   On: 11/02/2022 23:18    Microbiology: Recent Results (from the past 240 hour(s))  MRSA Next Gen by PCR, Nasal     Status: None   Collection Time: 11/21/22  3:37 PM   Specimen: Nasal Mucosa; Nasal Swab  Result Value Ref Range Status   MRSA by PCR Next Gen NOT DETECTED NOT DETECTED Final    Comment: (NOTE) The GeneXpert MRSA Assay (FDA approved for NASAL specimens only), is one component of a comprehensive MRSA colonization surveillance program. It is not intended to diagnose MRSA infection nor to guide or monitor treatment for MRSA infections. Test performance is not FDA approved in patients less than 43 years old. Performed at [facility], [address] [place], [place] [address]      Labs: Basic Metabolic Panel: Recent Labs  Lab 11/21/22 0411 11/21/22 0906 11/21/22 1543 11/22/22 0318 11/23/22 0028 11/23/22 1441 11/24/22 0058  NA 124*  --  127* 129* 122* 127* 128*  K 4.1  --  3.7 4.1 4.0 4.4 4.3  CL 88*  --   --  94* 88* 90* 93*  CO2 24  --   --  27 26 27 25   GLUCOSE 229*  --   --  188* 136* 204* 187*  BUN 20  --   --  19 21 18 17   CREATININE 1.71*  --   --  1.16* 1.34* 1.40* 1.38*  CALCIUM 8.9  --   --  8.2* 8.0* 8.6* 8.5*  MG  --  1.8  --   --   --   --   --    Liver Function Tests: No results for input(s): "AST", "ALT", "ALKPHOS", "BILITOT", "PROT", "ALBUMIN" in the last 168 hours. No results for input(s): "LIPASE", "AMYLASE" in the last 168 hours. No results for input(s): "AMMONIA" in the last 168 hours. CBC: Recent Labs  Lab 11/21/22 0411  11/21/22 1543 11/22/22 0318 11/23/22 0028  WBC 6.6  --  5.5 5.0  NEUTROABS 4.6  --   --   --   HGB 10.9* 10.5* 8.9* 8.5*  HCT 33.1* 31.0* 26.9* 25.6*  MCV 94.6  --  94.4 94.5  PLT 444*  --  341 318   Cardiac Enzymes: No results for input(s): "CKTOTAL", "CKMB", "CKMBINDEX", "TROPONINI" in the last 168 hours. BNP: BNP (last 3 results) Recent Labs    11/16/22 1701 11/17/22 0637 11/21/22 0411  BNP 638.9* 1,132.0* 697.2*    ProBNP (last 3 results) No results for input(s): "PROBNP" in the last 8760 hours.  CBG: Recent Labs  Lab 11/22/22 2137 11/23/22 0643 11/23/22 1116 11/23/22 1553 11/24/22 0605  GLUCAP 113* 110* 188* 262* 110*       Signed:  Zannie Cove MD.  [facility] 11/24/2022, 9:59 AM

## 2022-11-24 NOTE — Progress Notes (Signed)
Mobility Specialist Progress Note:   11/24/22 1415  Mobility  Activity Ambulated with assistance in hallway  Level of Assistance Standby assist, set-up cues, supervision of patient - no hands on  Assistive Device None  Distance Ambulated (ft) 400 ft  Activity Response Tolerated well  Mobility Referral Yes  $Mobility charge 1 Mobility  Mobility Specialist Start Time (ACUTE ONLY) 1407  Mobility Specialist Stop Time (ACUTE ONLY) 1422  Mobility Specialist Time Calculation (min) (ACUTE ONLY) 15 min   Pt eager for mobility session. Required no physical assistance throughout. Pt back in bed with all needs met, eager for d/c.  [name] Long Mobility Specialist Please contact via SecureChat or  Rehab office at [phone]

## 2022-11-25 ENCOUNTER — Telehealth: Payer: Self-pay

## 2022-11-25 DIAGNOSIS — R911 Solitary pulmonary nodule: Secondary | ICD-10-CM | POA: Diagnosis not present

## 2022-11-25 DIAGNOSIS — I5033 Acute on chronic diastolic (congestive) heart failure: Secondary | ICD-10-CM | POA: Diagnosis not present

## 2022-11-25 DIAGNOSIS — E1122 Type 2 diabetes mellitus with diabetic chronic kidney disease: Secondary | ICD-10-CM | POA: Diagnosis not present

## 2022-11-25 DIAGNOSIS — F209 Schizophrenia, unspecified: Secondary | ICD-10-CM | POA: Diagnosis not present

## 2022-11-25 DIAGNOSIS — M1990 Unspecified osteoarthritis, unspecified site: Secondary | ICD-10-CM | POA: Diagnosis not present

## 2022-11-25 DIAGNOSIS — N1832 Chronic kidney disease, stage 3b: Secondary | ICD-10-CM | POA: Diagnosis not present

## 2022-11-25 DIAGNOSIS — I2510 Atherosclerotic heart disease of native coronary artery without angina pectoris: Secondary | ICD-10-CM | POA: Diagnosis not present

## 2022-11-25 DIAGNOSIS — I4892 Unspecified atrial flutter: Secondary | ICD-10-CM | POA: Diagnosis not present

## 2022-11-25 DIAGNOSIS — M419 Scoliosis, unspecified: Secondary | ICD-10-CM | POA: Diagnosis not present

## 2022-11-25 DIAGNOSIS — G9340 Encephalopathy, unspecified: Secondary | ICD-10-CM | POA: Diagnosis not present

## 2022-11-25 DIAGNOSIS — J449 Chronic obstructive pulmonary disease, unspecified: Secondary | ICD-10-CM | POA: Diagnosis not present

## 2022-11-25 DIAGNOSIS — E7800 Pure hypercholesterolemia, unspecified: Secondary | ICD-10-CM | POA: Diagnosis not present

## 2022-11-25 DIAGNOSIS — Z7951 Long term (current) use of inhaled steroids: Secondary | ICD-10-CM | POA: Diagnosis not present

## 2022-11-25 DIAGNOSIS — D631 Anemia in chronic kidney disease: Secondary | ICD-10-CM | POA: Diagnosis not present

## 2022-11-25 DIAGNOSIS — G8929 Other chronic pain: Secondary | ICD-10-CM | POA: Diagnosis not present

## 2022-11-25 DIAGNOSIS — E039 Hypothyroidism, unspecified: Secondary | ICD-10-CM | POA: Diagnosis not present

## 2022-11-25 DIAGNOSIS — J9602 Acute respiratory failure with hypercapnia: Secondary | ICD-10-CM | POA: Diagnosis not present

## 2022-11-25 DIAGNOSIS — I214 Non-ST elevation (NSTEMI) myocardial infarction: Secondary | ICD-10-CM | POA: Diagnosis not present

## 2022-11-25 DIAGNOSIS — F0393 Unspecified dementia, unspecified severity, with mood disturbance: Secondary | ICD-10-CM | POA: Diagnosis not present

## 2022-11-25 DIAGNOSIS — G4733 Obstructive sleep apnea (adult) (pediatric): Secondary | ICD-10-CM | POA: Diagnosis not present

## 2022-11-25 DIAGNOSIS — J9601 Acute respiratory failure with hypoxia: Secondary | ICD-10-CM | POA: Diagnosis not present

## 2022-11-25 DIAGNOSIS — E871 Hypo-osmolality and hyponatremia: Secondary | ICD-10-CM | POA: Diagnosis not present

## 2022-11-25 DIAGNOSIS — I130 Hypertensive heart and chronic kidney disease with heart failure and stage 1 through stage 4 chronic kidney disease, or unspecified chronic kidney disease: Secondary | ICD-10-CM | POA: Diagnosis not present

## 2022-11-25 DIAGNOSIS — M545 Low back pain, unspecified: Secondary | ICD-10-CM | POA: Diagnosis not present

## 2022-11-25 DIAGNOSIS — I480 Paroxysmal atrial fibrillation: Secondary | ICD-10-CM | POA: Diagnosis not present

## 2022-11-25 NOTE — Transitions of Care (Post Inpatient/ED Visit) (Signed)
   11/25/2022  Name: [name] Long MRN: [id number] DOB: 11/28/1950  Today's TOC FU Call Status: Today's TOC FU Call Status:: Unsuccessul Call (1st Attempt) Unsuccessful Call (1st Attempt) Date: 11/25/22  Attempted to reach the patient regarding the most recent Inpatient/ED visit.  Follow Up Plan: Additional outreach attempts will be made to reach the patient to complete the Transitions of Care (Post Inpatient/ED visit) call.  Jodelle Gross, RN, BSN, CCM Care Management Coordinator [place]

## 2022-11-25 NOTE — Telephone Encounter (Signed)
error 

## 2022-11-29 ENCOUNTER — Telehealth: Payer: Self-pay

## 2022-11-29 DIAGNOSIS — I130 Hypertensive heart and chronic kidney disease with heart failure and stage 1 through stage 4 chronic kidney disease, or unspecified chronic kidney disease: Secondary | ICD-10-CM | POA: Diagnosis not present

## 2022-11-29 DIAGNOSIS — E871 Hypo-osmolality and hyponatremia: Secondary | ICD-10-CM | POA: Diagnosis not present

## 2022-11-29 DIAGNOSIS — I214 Non-ST elevation (NSTEMI) myocardial infarction: Secondary | ICD-10-CM | POA: Diagnosis not present

## 2022-11-29 DIAGNOSIS — E1122 Type 2 diabetes mellitus with diabetic chronic kidney disease: Secondary | ICD-10-CM | POA: Diagnosis not present

## 2022-11-29 DIAGNOSIS — J449 Chronic obstructive pulmonary disease, unspecified: Secondary | ICD-10-CM | POA: Diagnosis not present

## 2022-11-29 DIAGNOSIS — I5033 Acute on chronic diastolic (congestive) heart failure: Secondary | ICD-10-CM | POA: Diagnosis not present

## 2022-11-29 NOTE — Telephone Encounter (Signed)
Incoming call received from Sarah with [facility], requesting verbal orders for a social worker consultation to provide patient with community resources.  Per [employer] standing order, verbal order given. Message will be sent to patient's provider as a FYI.

## 2022-11-29 NOTE — Transitions of Care (Post Inpatient/ED Visit) (Signed)
11/29/2022  Name: [name] Long MRN: [id number] DOB: 02/22/1951  Today's TOC FU Call Status: Today's TOC FU Call Status:: Successful TOC FU Call Competed TOC FU Call Complete Date: 11/29/22  Transition Care Management Follow-up Telephone Call Date of Discharge: 11/24/22 Discharge Facility: [facility]) Type of Discharge: Inpatient Admission Primary Inpatient Discharge Diagnosis:: NSTEMI How have you been since you were released from the hospital?: Better Any questions or concerns?: No  Items Reviewed: Did you receive and understand the discharge instructions provided?: Yes Medications obtained,verified, and reconciled?: Yes (Medications Reviewed) Any new allergies since your discharge?: No Dietary orders reviewed?: No Do you have support at home?: Yes People in Home: child(ren), adult Name of Support/Comfort Primary Source: cASSANDRA  Medications Reviewed Today: Medications Reviewed Today     Reviewed by Jodelle Gross, RN (Case Manager) on 11/29/22 at 1019  Med List Status: <None>   Medication Order Taking? Sig Documenting Provider Last Dose Status Informant  acetaminophen (TYLENOL) 325 MG tablet [id number]  Take 2 tablets (650 mg total) by mouth every 4 (four) hours as needed for headache or mild pain. Orpah Cobb, MD  Active Child, Pharmacy Records  albuterol (PROVENTIL) (2.5 MG/3ML) 0.083% nebulizer solution [id number]  Take 3 mLs (2.5 mg total) by nebulization every 6 (six) hours as needed for wheezing or shortness of breath. Medina-Vargas, Monina C, NP  Active Child, Pharmacy Records  apixaban (ELIQUIS) 5 MG TABS tablet [id number] Yes Take 1 tablet (5 mg total) by mouth 2 (two) times daily. Zannie Cove, MD Taking Active   Blood Glucose Monitoring Suppl (FREESTYLE LITE) w/Device KIT [id number]  1 each by Does not apply route daily. Dx:E11.9 Ngetich, Donalee Citrin, NP  Active Pharmacy Records, Child  Blood Pressure Monitoring (BLOOD PRESSURE MONITOR/WRIST) KIT [id number]  1  Units by Does not apply route daily at 6 (six) AM. Ngetich, [name] C, NP  Active Pharmacy Records, Child  clopidogrel (PLAVIX) 75 MG tablet [id number] Yes Take 1 tablet (75 mg total) by mouth daily with breakfast. Zannie Cove, MD Taking Active   dapagliflozin propanediol (FARXIGA) 5 MG TABS tablet [id number] Yes Take 1 tablet (5 mg total) by mouth daily before breakfast. Orpah Cobb, MD Taking Active Child, Pharmacy Records  divalproex (DEPAKOTE SPRINKLE) 125 MG capsule [id number] Yes Take 2 capsules (250 mg total) by mouth every 12 (twelve) hours. Rinaldo Cloud, MD Taking Active Child, Pharmacy Records  glucose blood (FREESTYLE LITE) test strip [id number]  Use as instructed Medina-Vargas, Margit Banda, NP  Active Pharmacy Records, Child           Med Note Michaell Cowing   Wed Nov 02, 2022  2:39 PM)    insulin aspart (NOVOLOG) 100 UNIT/ML injection [id number] Yes Inject 0-15 Units into the skin 3 (three) times daily with meals. Orpah Cobb, MD Taking Active Child, Pharmacy Records           Med Note Sedonia Small Nov 21, 2022  7:56 AM) No dispense records.   Lancets (FREESTYLE) lancets [id number]  Use to check blood sugar once daily. Dx: E11.9 Medina-Vargas, Margit Banda, NP  Active Pharmacy Records, Child  levothyroxine (SYNTHROID) 50 MCG tablet [id number] Yes Take 50 mcg by mouth daily. [provider] Taking Active Pharmacy Records, Child           Med Note Michaell Cowing   Wed Nov 02, 2022  2:39 PM)    menthol-cetylpyridinium (CEPACOL) 3 MG lozenge [id number]  Take 1 lozenge (3 mg total) by mouth as  needed for sore throat. Orpah Cobb, MD  Active Child, Pharmacy Records  methocarbamol (ROBAXIN) 500 MG tablet [id number]  TAKE 1 TABLET(500 MG) BY MOUTH THREE TIMES DAILY AS NEEDED FOR MUSCLE SPASMS  Patient taking differently: Take 500 mg by mouth 3 (three) times daily between meals as needed for muscle spasms.   Adonis Huguenin, NP  Active Pharmacy Records, Child  metoprolol  succinate (TOPROL-XL) 50 MG 24 hr tablet [id number] Yes Take 0.5 tablets (25 mg total) by mouth daily. Zannie Cove, MD Taking Active   nitroGLYCERIN (NITROSTAT) 0.4 MG SL tablet [id number]  Place 1 tablet (0.4 mg total) under the tongue as needed.  Patient taking differently: Place 0.4 mg under the tongue as needed for chest pain.   Sarina Ill, DO  Active Pharmacy Records, Child           Med Note Michaell Cowing   Wed Nov 02, 2022  2:42 PM)    omeprazole (PRILOSEC) 40 MG capsule [id number] Yes Take 40 mg by mouth 2 (two) times daily. [provider] Taking Active Pharmacy Records, Child           Med Note Michaell Cowing   Wed Nov 02, 2022  2:42 PM)    potassium chloride (KLOR-CON M) 10 MEQ tablet [id number] Yes Take 1 tablet (10 mEq total) by mouth daily. Orpah Cobb, MD Taking Active Child, Pharmacy Records  rosuvastatin (CRESTOR) 40 MG tablet [id number] Yes Take 1 tablet (40 mg total) by mouth daily. Sarina Ill, DO Taking Active Pharmacy Records, Child           Med Note Michaell Cowing   Wed Nov 02, 2022  2:42 PM)    spironolactone (ALDACTONE) 25 MG tablet [id number] Yes Take 1 tablet (25 mg total) by mouth daily. Zannie Cove, MD Taking Active   sucralfate (CARAFATE) 1 GM/10ML suspension [id number] Yes Take 1 g by mouth 2 (two) times daily as needed (antacid.). [provider] Taking Active Pharmacy Records, Child           Med Note Michaell Cowing   Wed Nov 02, 2022  2:43 PM)    torsemide (DEMADEX) 20 MG tablet [id number] Yes Take 1 tablet (20 mg total) by mouth daily. Zannie Cove, MD Taking Active   TRADJENTA 5 MG TABS tablet [id number] Yes Take 5 mg by mouth daily. [provider] Taking Active Pharmacy Records, Child           Med Note Michaell Cowing   Wed Nov 02, 2022  2:43 PM)              Home Care and Equipment/Supplies: Were Home Health Services Ordered?: Yes Name of Home Health Agency:: [facility] Has Agency  set up a time to come to your home?: Yes First Home Health Visit Date: 11/26/22 Any new equipment or medical supplies ordered?: No  Functional Questionnaire: Do you need assistance with bathing/showering or dressing?: Yes Do you need assistance with meal preparation?: Yes Do you need assistance with eating?: No Do you have difficulty maintaining continence: No Do you need assistance with getting out of bed/getting out of a chair/moving?: No Do you have difficulty managing or taking your medications?: Yes  Follow up appointments reviewed: PCP Follow-up appointment confirmed?: Yes Date of PCP follow-up appointment?: 12/08/22 Follow-up Provider: [facility] Follow-up appointment confirmed?: Yes Date of Specialist follow-up appointment?: 11/09/2022 Follow-Up Specialty Provider:: Dr. Viviano Simas Do you need transportation to your follow-up appointment?: No Do you understand  care options if your condition(s) worsen?: Yes-patient verbalized understanding (Spoke with patients guardian, Agricultural consultant)  SDOH Interventions Today    Flowsheet Row Most Recent Value  SDOH Interventions   Food Insecurity Interventions Intervention Not Indicated  Housing Interventions Intervention Not Indicated  Transportation Interventions Intervention Not Indicated  Financial Strain Interventions Intervention Not Indicated      Interventions Today    Flowsheet Row Most Recent Value  Chronic Disease   Chronic disease during today's visit Congestive Heart Failure (CHF), Hypertension (HTN)  General Interventions   General Interventions Discussed/Reviewed General Interventions Discussed, Teacher, early years/pre Interventions   Pharmacy Dicussed/Reviewed Pharmacy Topics Discussed, Medications and their functions       TOC Interventions Today    Flowsheet Row Most Recent Value  TOC Interventions   TOC Interventions Discussed/Reviewed TOC Interventions Discussed, TOC Interventions  Reviewed       Jodelle Gross, RN, BSN, CCM Care Management Coordinator [place]

## 2022-11-29 NOTE — Telephone Encounter (Signed)
Noted  

## 2022-11-30 ENCOUNTER — Emergency Department (HOSPITAL_COMMUNITY): Payer: Medicare Other

## 2022-11-30 ENCOUNTER — Emergency Department (HOSPITAL_COMMUNITY)
Admission: EM | Admit: 2022-11-30 | Discharge: 2022-12-03 | Disposition: E | Payer: Medicare Other | Attending: Emergency Medicine | Admitting: Emergency Medicine

## 2022-11-30 ENCOUNTER — Ambulatory Visit (INDEPENDENT_AMBULATORY_CARE_PROVIDER_SITE_OTHER): Payer: Medicare Other | Admitting: Adult Health

## 2022-11-30 ENCOUNTER — Encounter: Payer: Self-pay | Admitting: Adult Health

## 2022-11-30 VITALS — BP 137/88 | HR 84 | Temp 97.8°F | Resp 18 | Ht 63.0 in | Wt 146.6 lb

## 2022-11-30 DIAGNOSIS — N183 Chronic kidney disease, stage 3 unspecified: Secondary | ICD-10-CM | POA: Diagnosis not present

## 2022-11-30 DIAGNOSIS — F250 Schizoaffective disorder, bipolar type: Secondary | ICD-10-CM

## 2022-11-30 DIAGNOSIS — J441 Chronic obstructive pulmonary disease with (acute) exacerbation: Secondary | ICD-10-CM | POA: Insufficient documentation

## 2022-11-30 DIAGNOSIS — Z794 Long term (current) use of insulin: Secondary | ICD-10-CM | POA: Insufficient documentation

## 2022-11-30 DIAGNOSIS — E872 Acidosis, unspecified: Secondary | ICD-10-CM | POA: Diagnosis not present

## 2022-11-30 DIAGNOSIS — Z955 Presence of coronary angioplasty implant and graft: Secondary | ICD-10-CM | POA: Diagnosis not present

## 2022-11-30 DIAGNOSIS — I5032 Chronic diastolic (congestive) heart failure: Secondary | ICD-10-CM | POA: Diagnosis not present

## 2022-11-30 DIAGNOSIS — J449 Chronic obstructive pulmonary disease, unspecified: Secondary | ICD-10-CM | POA: Diagnosis not present

## 2022-11-30 DIAGNOSIS — R0603 Acute respiratory distress: Secondary | ICD-10-CM

## 2022-11-30 DIAGNOSIS — Z7901 Long term (current) use of anticoagulants: Secondary | ICD-10-CM | POA: Insufficient documentation

## 2022-11-30 DIAGNOSIS — N189 Chronic kidney disease, unspecified: Secondary | ICD-10-CM | POA: Insufficient documentation

## 2022-11-30 DIAGNOSIS — I469 Cardiac arrest, cause unspecified: Secondary | ICD-10-CM | POA: Insufficient documentation

## 2022-11-30 DIAGNOSIS — J9602 Acute respiratory failure with hypercapnia: Secondary | ICD-10-CM | POA: Insufficient documentation

## 2022-11-30 DIAGNOSIS — J810 Acute pulmonary edema: Secondary | ICD-10-CM | POA: Diagnosis not present

## 2022-11-30 DIAGNOSIS — I10 Essential (primary) hypertension: Secondary | ICD-10-CM | POA: Diagnosis not present

## 2022-11-30 DIAGNOSIS — F419 Anxiety disorder, unspecified: Secondary | ICD-10-CM | POA: Diagnosis not present

## 2022-11-30 DIAGNOSIS — I2510 Atherosclerotic heart disease of native coronary artery without angina pectoris: Secondary | ICD-10-CM | POA: Insufficient documentation

## 2022-11-30 DIAGNOSIS — K2950 Unspecified chronic gastritis without bleeding: Secondary | ICD-10-CM | POA: Diagnosis not present

## 2022-11-30 DIAGNOSIS — R531 Weakness: Secondary | ICD-10-CM | POA: Diagnosis not present

## 2022-11-30 DIAGNOSIS — R0689 Other abnormalities of breathing: Secondary | ICD-10-CM | POA: Diagnosis not present

## 2022-11-30 DIAGNOSIS — I480 Paroxysmal atrial fibrillation: Secondary | ICD-10-CM | POA: Diagnosis not present

## 2022-11-30 DIAGNOSIS — Z91040 Latex allergy status: Secondary | ICD-10-CM | POA: Insufficient documentation

## 2022-11-30 DIAGNOSIS — R404 Transient alteration of awareness: Secondary | ICD-10-CM | POA: Diagnosis not present

## 2022-11-30 DIAGNOSIS — I509 Heart failure, unspecified: Secondary | ICD-10-CM | POA: Insufficient documentation

## 2022-11-30 DIAGNOSIS — E1122 Type 2 diabetes mellitus with diabetic chronic kidney disease: Secondary | ICD-10-CM | POA: Diagnosis not present

## 2022-11-30 DIAGNOSIS — I214 Non-ST elevation (NSTEMI) myocardial infarction: Secondary | ICD-10-CM

## 2022-11-30 DIAGNOSIS — R4589 Other symptoms and signs involving emotional state: Secondary | ICD-10-CM | POA: Diagnosis not present

## 2022-11-30 LAB — CBC
HCT: 32.9 % — ABNORMAL LOW (ref 36.0–46.0)
Hemoglobin: 10.2 g/dL — ABNORMAL LOW (ref 12.0–15.0)
MCH: 31.6 pg (ref 26.0–34.0)
MCHC: 31 g/dL (ref 30.0–36.0)
MCV: 101.9 fL — ABNORMAL HIGH (ref 80.0–100.0)
Platelets: 316 10*3/uL (ref 150–400)
RBC: 3.23 MIL/uL — ABNORMAL LOW (ref 3.87–5.11)
RDW: 17.2 % — ABNORMAL HIGH (ref 11.5–15.5)
WBC: 7.6 10*3/uL (ref 4.0–10.5)
nRBC: 0 % (ref 0.0–0.2)

## 2022-11-30 LAB — COMPREHENSIVE METABOLIC PANEL
ALT: 22 U/L (ref 0–44)
AST: 25 U/L (ref 15–41)
Albumin: 2.6 g/dL — ABNORMAL LOW (ref 3.5–5.0)
Alkaline Phosphatase: 72 U/L (ref 38–126)
Anion gap: 11 (ref 5–15)
BUN: 22 mg/dL (ref 8–23)
CO2: 17 mmol/L — ABNORMAL LOW (ref 22–32)
Calcium: 8.2 mg/dL — ABNORMAL LOW (ref 8.9–10.3)
Chloride: 106 mmol/L (ref 98–111)
Creatinine, Ser: 1.84 mg/dL — ABNORMAL HIGH (ref 0.44–1.00)
GFR, Estimated: 29 mL/min — ABNORMAL LOW (ref >60)
Glucose, Bld: 338 mg/dL — ABNORMAL HIGH (ref 70–99)
Potassium: 5.8 mmol/L — ABNORMAL HIGH (ref 3.5–5.1)
Sodium: 134 mmol/L — ABNORMAL LOW (ref 135–145)
Total Bilirubin: 0.4 mg/dL (ref 0.3–1.2)
Total Protein: 6.8 g/dL (ref 6.5–8.1)

## 2022-11-30 LAB — CBC WITH DIFFERENTIAL/PLATELET
Absolute Monocytes: 889 cells/uL (ref 200–950)
Basophils Relative: 0.3 %
HCT: 30.4 % — ABNORMAL LOW (ref 35.0–45.0)
Hemoglobin: 9.9 g/dL — ABNORMAL LOW (ref 11.7–15.5)
Monocytes Relative: 11.4 %
Neutrophils Relative %: 67.3 %
Platelets: 277 10*3/uL (ref 140–400)
RBC: 3.11 10*6/uL — ABNORMAL LOW (ref 3.80–5.10)

## 2022-11-30 LAB — I-STAT VENOUS BLOOD GAS, ED
Acid-base deficit: 15 mmol/L — ABNORMAL HIGH (ref 0.0–2.0)
Bicarbonate: 17.6 mmol/L — ABNORMAL LOW (ref 20.0–28.0)
Calcium, Ion: 1.1 mmol/L — ABNORMAL LOW (ref 1.15–1.40)
HCT: 34 % — ABNORMAL LOW (ref 36.0–46.0)
Hemoglobin: 11.6 g/dL — ABNORMAL LOW (ref 12.0–15.0)
O2 Saturation: 87 %
Potassium: 5.8 mmol/L — ABNORMAL HIGH (ref 3.5–5.1)
Sodium: 135 mmol/L (ref 135–145)
TCO2: 20 mmol/L — ABNORMAL LOW (ref 22–32)
pCO2, Ven: 79.5 mmHg (ref 44–60)
pH, Ven: 6.954 — CL (ref 7.25–7.43)
pO2, Ven: 84 mmHg — ABNORMAL HIGH (ref 32–45)

## 2022-11-30 LAB — TROPONIN I (HIGH SENSITIVITY): Troponin I (High Sensitivity): 68 ng/L — ABNORMAL HIGH (ref <18)

## 2022-11-30 MED ORDER — NOREPINEPHRINE 4 MG/250ML-% IV SOLN
INTRAVENOUS | Status: AC
  Filled 2022-11-30: qty 250

## 2022-11-30 MED ORDER — EPINEPHRINE 1 MG/10ML IJ SOSY
PREFILLED_SYRINGE | INTRAMUSCULAR | Status: AC | PRN
  Administered 2022-11-30 (×5): 1 mg via INTRAVENOUS

## 2022-11-30 MED ORDER — SUCRALFATE 1 GM/10ML PO SUSP: 1.0000 g | Freq: Two times a day (BID) | ORAL | 3 refills | Status: DC | PRN

## 2022-11-30 MED ORDER — FENTANYL BOLUS VIA INFUSION: 25.0000 ug | INTRAVENOUS | Status: DC | PRN

## 2022-11-30 MED ORDER — ROCURONIUM BROMIDE 50 MG/5ML IV SOLN: 100.0000 mg | Freq: Once | INTRAVENOUS | Status: DC

## 2022-11-30 MED ORDER — EPINEPHRINE 1 MG/10ML IJ SOSY
PREFILLED_SYRINGE | INTRAMUSCULAR | Status: AC | PRN
  Administered 2022-11-30: 1 mg via INTRAVENOUS

## 2022-11-30 MED ORDER — SODIUM BICARBONATE 8.4 % IV SOLN
INTRAVENOUS | Status: AC | PRN
  Administered 2022-11-30 (×4): 50 meq via INTRAVENOUS

## 2022-11-30 MED ORDER — ATROPINE SULFATE 1 MG/10ML IJ SOSY
PREFILLED_SYRINGE | INTRAMUSCULAR | Status: AC | PRN
  Administered 2022-11-30: 1 mg via INTRAVENOUS

## 2022-11-30 MED ORDER — LORAZEPAM 0.5 MG PO TABS
0.5000 mg | ORAL_TABLET | Freq: Two times a day (BID) | ORAL | 0 refills | Status: DC | PRN
Start: 2022-11-30 — End: 2022-12-01

## 2022-11-30 MED ORDER — KETAMINE HCL 50 MG/5ML IJ SOSY
120.0000 mg | PREFILLED_SYRINGE | Freq: Once | INTRAMUSCULAR | Status: AC
  Administered 2022-11-30: 120 mg via INTRAVENOUS
  Filled 2022-11-30: qty 15

## 2022-11-30 MED ORDER — MAGNESIUM SULFATE 2 GM/50ML IV SOLN: 2.0000 g | Freq: Once | INTRAVENOUS | Status: DC

## 2022-11-30 MED ORDER — AMIODARONE HCL 150 MG/3ML IV SOLN
INTRAVENOUS | Status: AC | PRN
  Administered 2022-11-30: 300 mg via INTRAVENOUS

## 2022-11-30 MED ORDER — NALOXONE HCL 0.4 MG/ML IJ SOLN
0.4000 mg | Freq: Once | INTRAMUSCULAR | Status: AC
  Administered 2022-11-30: 0.4 mg via INTRAVENOUS

## 2022-11-30 MED ORDER — NOREPINEPHRINE BITARTRATE 1 MG/ML IV SOLN
INTRAVENOUS | Status: AC | PRN
  Administered 2022-11-30: 30 ug/kg/min via INTRAVENOUS

## 2022-11-30 MED ORDER — EPINEPHRINE 0.3 MG/0.3ML IJ SOAJ
0.3000 mg | Freq: Once | INTRAMUSCULAR | Status: AC
  Administered 2022-11-30: 0.3 mg via INTRAMUSCULAR

## 2022-11-30 MED ORDER — EPINEPHRINE 0.1 MG/10ML (10 MCG/ML) SYRINGE FOR IV PUSH (FOR BLOOD PRESSURE SUPPORT): PREFILLED_SYRINGE | INTRAVENOUS | Status: AC | PRN

## 2022-11-30 MED ORDER — FENTANYL 2500MCG IN NS 250ML (10MCG/ML) PREMIX INFUSION
25.0000 ug/h | INTRAVENOUS | Status: DC
  Filled 2022-11-30: qty 250

## 2022-11-30 MED ORDER — CALCIUM CHLORIDE 10 % IV SOLN
INTRAVENOUS | Status: AC | PRN
  Administered 2022-11-30 (×2): 1 g via INTRAVENOUS

## 2022-11-30 MED ORDER — METHYLPREDNISOLONE SODIUM SUCC 125 MG IJ SOLR: 125.0000 mg | INTRAMUSCULAR | Status: DC

## 2022-11-30 MED ORDER — ROCURONIUM BROMIDE 50 MG/5ML IV SOLN
75.0000 mg | Freq: Once | INTRAVENOUS | Status: AC
  Administered 2022-11-30: 75 mg via INTRAVENOUS
  Filled 2022-11-30: qty 7.5

## 2022-12-01 LAB — CBC WITH DIFFERENTIAL/PLATELET
Basophils Absolute: 23 cells/uL (ref 0–200)
Eosinophils Absolute: 39 cells/uL (ref 15–500)
Eosinophils Relative: 0.5 %
Lymphs Abs: 1599 cells/uL (ref 850–3900)
MCH: 31.8 pg (ref 27.0–33.0)
MCHC: 32.6 g/dL (ref 32.0–36.0)
MCV: 97.7 fL (ref 80.0–100.0)
MPV: 9.9 fL (ref 7.5–12.5)
Neutro Abs: 5249 cells/uL (ref 1500–7800)
RDW: 15.6 % — ABNORMAL HIGH (ref 11.0–15.0)
Total Lymphocyte: 20.5 %
WBC: 7.8 10*3/uL (ref 3.8–10.8)

## 2022-12-01 LAB — BASIC METABOLIC PANEL WITH GFR
BUN/Creatinine Ratio: 16 (calc) (ref 6–22)
BUN: 24 mg/dL (ref 7–25)
CO2: 20 mmol/L (ref 20–32)
Calcium: 8.4 mg/dL — ABNORMAL LOW (ref 8.6–10.4)
Chloride: 108 mmol/L (ref 98–110)
Creat: 1.48 mg/dL — ABNORMAL HIGH (ref 0.60–1.00)
Glucose, Bld: 82 mg/dL (ref 65–99)
Potassium: 4.8 mmol/L (ref 3.5–5.3)
Sodium: 136 mmol/L (ref 135–146)
eGFR: 38 mL/min/{1.73_m2} — ABNORMAL LOW (ref >=60)

## 2022-12-01 MED FILL — Norepinephrine-Dextrose IV Solution 4 MG/250ML-5%: INTRAVENOUS | Qty: 250 | Status: AC

## 2022-12-01 NOTE — Progress Notes (Signed)
   11/02/2022 2358  Spiritual Encounters  Type of Visit Initial  Care provided to: Family  Conversation partners present during encounter Nurse;Physician  Referral source Clinical staff  Reason for visit Code  OnCall Visit Yes  Spiritual Framework  Community/Connection Spiritual leader;Family  Family Stress Factors Loss  Interventions  Spiritual Care Interventions Made Established relationship of care and support;Compassionate presence;Bereavement/grief support;Supported grief process;Other (comment) Engineer, manufacturing systems)  Intervention Outcomes  Outcomes Connection to spiritual care  Spiritual Care Plan  Spiritual Care Issues Still Outstanding No further spiritual care needs at this time (see row info)    Chaplain rec'd notification from ED about ongoing code. When chaplain arrived, chaplain was informed that a daughter was in the waiting room; however, after several attempts, no one could be located. Security shortly thereafter notified chaplain that family had arrived. Chaplain escorted family to consultation room. Chaplain informed MD about family's presence. Chaplain accompanied MD to consultation room in order to provide support. Chaplain offered family emotional support through providing hospitality and answering questions in order to make the process more understandable. Chaplain worked with family and ED staff in arranging family visitation. Chaplain escorted family in and out of the ED. Family was appreciative of chaplain's presence.

## 2022-12-03 NOTE — Code Documentation (Signed)
No pulse, CRP initiated, Compressions started.

## 2022-12-03 NOTE — Code Documentation (Signed)
Pulse check, No Pulse, CPR continued,  

## 2022-12-03 NOTE — Code Documentation (Signed)
Pulse check, No Pulse, CPR continued,

## 2022-12-03 NOTE — Code Documentation (Signed)
PEA, Compressions started

## 2022-12-03 NOTE — Progress Notes (Unsigned)
[facility] clinic  Provider:  Kenard Gower DNP  Code Status:  Full Code  Goals of Care:     11/21/2022    4:11 AM  Advanced Directives  Does Patient Have a Medical Advance Directive? No  Would patient like information on creating a medical advance directive? No - Patient declined     Chief Complaint  Patient presents with   Transitions Of Care    Discharged from [facility] 11/23/22    HPI: Patient is a 72 y.o. female seen today for an acute visit for transition of care. She was accompanied today by her son [name] Long. She was hospitalized 11/21/22 to 11/24/22 for NSTEMI.Daughter noticed that she was "panting" so she brought her to the hospital. Of note, on 5/1 to 11/07/22, was admitted to the hospital for NSTEMI, refused cardiac catheterization and discharged home. She was admitted to the hospital again on 5/15 to 11/20/22 for CHF/chest pain and was diuresed and discharged home. On 11/21/22 admission, [name], cardiology, was consulted and was taken to [facility] that showed severe RCA/Lcx disease, moderate LAD disease. She was continued on Plavix, Metoprolol and Crestor. CT surgery consultation was considered however patient declined surgery at this time.  She was very verbal today and most of the stories were refuted by son. Daughter [name] Long was on video call during the visit. Family is discussing where she can stay whether ILF, ALF or SNF. She was reported to smoke 6 sticks/day. She uses BiPAP at home.    Past Medical History:  Diagnosis Date   Abscessed tooth 02/09/2020   Acute on chronic congestive heart failure (HCC)    Adenomatous colon polyp    Arthritis    "real bad; all over" (07/12/2017)   Asthma    Atrial fibrillation (HCC)    BIPOLAR DISORDER 08/31/2006   Qualifier: Diagnosis of  By: Irving Burton MD, Clifton Custard     CHRONIC KIDNEY DISEASE STAGE II (MILD) 09/14/2009   Annotation: eGFR 64 Qualifier: Diagnosis of  By: Louanne Belton MD, Rolm Gala     Chronic lower back pain    Congestive heart failure (CHF)  (HCC)    COPD (chronic obstructive pulmonary disease) (HCC) 09/23/2010   Diagnosed at [facility] in 2008 (Dr. Maple Hudson)    DM (diabetes mellitus) type II controlled with renal manifestation (HCC) 08/31/2006   Qualifier: Diagnosis of  By: Irving Burton MD, Clifton Custard     Dyspnea    "all my life; since 6th grade" (07/12/2017)   Fatty liver    HYPERCHOLESTEROLEMIA 08/31/2006   Intolerance to Lipitor OK on Crestor but medicaid no longer covering     HYPERTENSION, BENIGN SYSTEMIC 08/31/2006   Qualifier: Diagnosis of  By: Irving Burton MD, Aaron     Hypokalemia    HYPOTHYROIDISM, UNSPECIFIED 08/31/2006   Qualifier: Diagnosis of  By: Irving Burton MD, Clifton Custard     Leg swelling 03/14/2018   Pneumonia    "3 times" (07/12/2017)   Pulmonary nodule    Renal cyst    Schizophrenia (HCC)    Scoliosis    Stomach problems    Thyroid disorder     Past Surgical History:  Procedure Laterality Date   CESAREAN SECTION     FOOT FRACTURE SURGERY Right    "steel plate in it"   FOOT SURGERY     " born w/dislocated foot"   FRACTURE SURGERY     KNEE ARTHROSCOPY Right    LEFT HEART CATH AND CORONARY ANGIOGRAPHY N/A 11/21/2022   Procedure: LEFT HEART CATH AND CORONARY ANGIOGRAPHY;  Surgeon:  Orpah Cobb, MD;  Location: [facility] INVASIVE CV LAB;  Service: Cardiovascular;  Laterality: N/A;   TOE SURGERY Bilateral    "both pinky toes"   TONSILLECTOMY AND ADENOIDECTOMY      Allergies  Allergen Reactions   Citrus Anaphylaxis and Itching   Fish Allergy Anaphylaxis and Other (See Comments)   Shellfish Allergy Shortness Of Breath and Other (See Comments)    "Affects thyroid" also   Adhesive [Tape] Other (See Comments)    Must have paper tape only   Ibuprofen Swelling and Other (See Comments)    Face swells   Latex Dermatitis   Lipitor [Atorvastatin Calcium] Other (See Comments)    Body aches   Lisinopril Other (See Comments)    PER DR. Sherene Sires (not recalled by patient)   Other Nausea Only and Other (See Comments)    Collards (gas, too)    Codeine Rash   Tramadol Palpitations    Outpatient Encounter Medications as of 11/23/2022  Medication Sig   acetaminophen (TYLENOL) 325 MG tablet Take 2 tablets (650 mg total) by mouth every 4 (four) hours as needed for headache or mild pain.   albuterol (PROVENTIL) (2.5 MG/3ML) 0.083% nebulizer solution Take 3 mLs (2.5 mg total) by nebulization every 6 (six) hours as needed for wheezing or shortness of breath.   apixaban (ELIQUIS) 5 MG TABS tablet Take 1 tablet (5 mg total) by mouth 2 (two) times daily.   Blood Glucose Monitoring Suppl (FREESTYLE LITE) w/Device KIT 1 each by Does not apply route daily. Dx:E11.9   Blood Pressure Monitoring (BLOOD PRESSURE MONITOR/WRIST) KIT 1 Units by Does not apply route daily at 6 (six) AM.   clopidogrel (PLAVIX) 75 MG tablet Take 1 tablet (75 mg total) by mouth daily with breakfast.   dapagliflozin propanediol (FARXIGA) 5 MG TABS tablet Take 1 tablet (5 mg total) by mouth daily before breakfast.   divalproex (DEPAKOTE SPRINKLE) 125 MG capsule Take 2 capsules (250 mg total) by mouth every 12 (twelve) hours.   glucose blood (FREESTYLE LITE) test strip Use as instructed   hydrochlorothiazide (HYDRODIURIL) 25 MG tablet Take 25 mg by mouth daily.   insulin aspart (NOVOLOG) 100 UNIT/ML injection Inject 0-15 Units into the skin 3 (three) times daily with meals.   Lancets (FREESTYLE) lancets Use to check blood sugar once daily. Dx: E11.9   levothyroxine (SYNTHROID) 50 MCG tablet Take 50 mcg by mouth daily.   menthol-cetylpyridinium (CEPACOL) 3 MG lozenge Take 1 lozenge (3 mg total) by mouth as needed for sore throat.   methocarbamol (ROBAXIN) 500 MG tablet TAKE 1 TABLET(500 MG) BY MOUTH THREE TIMES DAILY AS NEEDED FOR MUSCLE SPASMS (Patient taking differently: Take 500 mg by mouth 3 (three) times daily between meals as needed for muscle spasms.)   metoprolol succinate (TOPROL-XL) 50 MG 24 hr tablet Take 0.5 tablets (25 mg total) by mouth daily.   nitroGLYCERIN  (NITROSTAT) 0.4 MG SL tablet Place 1 tablet (0.4 mg total) under the tongue as needed. (Patient taking differently: Place 0.4 mg under the tongue as needed for chest pain.)   omeprazole (PRILOSEC) 40 MG capsule Take 40 mg by mouth 2 (two) times daily.   potassium chloride (KLOR-CON M) 10 MEQ tablet Take 1 tablet (10 mEq total) by mouth daily.   rosuvastatin (CRESTOR) 40 MG tablet Take 1 tablet (40 mg total) by mouth daily.   spironolactone (ALDACTONE) 25 MG tablet Take 1 tablet (25 mg total) by mouth daily.   sucralfate (CARAFATE) 1 GM/10ML suspension Take  1 g by mouth 2 (two) times daily as needed (antacid.).   torsemide (DEMADEX) 20 MG tablet Take 1 tablet (20 mg total) by mouth daily.   TRADJENTA 5 MG TABS tablet Take 5 mg by mouth daily.   [DISCONTINUED] ibuprofen (ADVIL) 800 MG tablet Take 800 mg by mouth daily.   No facility-administered encounter medications on file as of 11/29/2022.    Review of Systems:  Review of Systems  Constitutional:  Negative for appetite change, chills, fatigue and fever.  HENT:  Negative for congestion, hearing loss, rhinorrhea and sore throat.   Eyes: Negative.   Respiratory:  Negative for cough, shortness of breath and wheezing.   Cardiovascular:  Negative for chest pain, palpitations and leg swelling.  Gastrointestinal:  Negative for abdominal pain, constipation, diarrhea, nausea and vomiting.  Genitourinary:  Negative for dysuria.  Musculoskeletal:  Negative for arthralgias, back pain and myalgias.  Skin:  Negative for color change, rash and wound.  Neurological:  Negative for dizziness, weakness and headaches.  Psychiatric/Behavioral:  Negative for behavioral problems. The patient is not nervous/anxious.     Health Maintenance  Topic Date Due   Zoster Vaccines- Shingrix (1 of 2) Never done   Lung Cancer Screening  Never done   FOOT EXAM  04/26/2018   COVID-19 Vaccine (3 - Pfizer risk series) 12/17/2019   Medicare Annual Wellness (AWV)   07/17/2020   Diabetic kidney evaluation - Urine ACR  03/23/2021   DTaP/Tdap/Td (3 - Td or Tdap) 06/07/2021   OPHTHALMOLOGY EXAM  10/28/2021   Colonoscopy  11/05/2022   INFLUENZA VACCINE  02/02/2023   LIPID PANEL  05/06/2023   HEMOGLOBIN A1C  05/24/2023   MAMMOGRAM  06/01/2023   Diabetic kidney evaluation - eGFR measurement  11/24/2023   Pneumonia Vaccine 50+ Years old  Completed   DEXA SCAN  Completed   Hepatitis C Screening  Completed   HPV VACCINES  Aged Out    Physical Exam: Vitals:   11/02/2022 0922  BP: 137/88  Pulse: 84  Resp: 18  Temp: 97.8 F (36.6 C)  SpO2: 95%  Weight: 146 lb 9.6 oz (66.5 kg)  Height: 5\' 3"  (1.6 m)   Body mass index is 25.97 kg/m. Physical Exam Constitutional:      Appearance: Normal appearance.  HENT:     Head: Normocephalic and atraumatic.     Nose: Nose normal.     Mouth/Throat:     Mouth: Mucous membranes are moist.  Eyes:     Conjunctiva/sclera: Conjunctivae normal.  Cardiovascular:     Rate and Rhythm: Normal rate and regular rhythm.  Pulmonary:     Effort: Pulmonary effort is normal.     Breath sounds: Normal breath sounds.  Abdominal:     General: Bowel sounds are normal.     Palpations: Abdomen is soft.  Musculoskeletal:        General: Swelling present. Normal range of motion.     Cervical back: Normal range of motion.     Right lower leg: Edema present.     Left lower leg: Edema present.     Comments: BLE 1+edema  Skin:    General: Skin is warm and dry.  Neurological:     General: No focal deficit present.     Mental Status: She is alert and oriented to person, place, and time.  Psychiatric:        Mood and Affect: Mood normal.        Behavior: Behavior normal.  Thought Content: Thought content normal.        Judgment: Judgment normal.     Labs reviewed: Basic Metabolic Panel: Recent Labs    10/04/22 1800 11/02/22 1524 11/02/22 2329 11/04/22 0809 11/07/22 0218 11/17/22 3086 11/18/22 0112  11/21/22 0906 11/21/22 1543 11/23/22 0028 11/23/22 1441 11/24/22 0058  NA  --  136   < > 132*   < > 128*   < >  --    < > 122* 127* 128*  K  --  3.6   < > 3.2*   < > 3.4*   < >  --    < > 4.0 4.4 4.3  CL  --  104   < > 98   < > 98   < >  --    < > 88* 90* 93*  CO2  --  23   < > 24   < > 24   < >  --    < > 26 27 25   GLUCOSE  --  95   < > 175*   < > 86   < >  --    < > 136* 204* 187*  BUN  --  16   < > 25*   < > 14   < >  --    < > 21 18 17   CREATININE  --  1.20*   < > 1.51*   < > 1.34*   < >  --    < > 1.34* 1.40* 1.38*  CALCIUM  --  8.9   < > 8.2*   < > 8.2*   < >  --    < > 8.0* 8.6* 8.5*  MG  --   --   --  2.1  --  1.7  --  1.8  --   --   --   --   TSH 0.987 1.02  --   --   --  0.198*  --   --   --   --   --   --    < > = values in this interval not displayed.   Liver Function Tests: Recent Labs    11/04/22 0809 11/16/22 1701 11/17/22 0637  AST 21 15 13*  ALT 25 15 14   ALKPHOS 86 73 66  BILITOT 0.2* 1.7* 1.8*  PROT 6.5 6.6 6.4*  ALBUMIN 2.6* 2.2* 2.1*   No results for input(s): "LIPASE", "AMYLASE" in the last 8760 hours. No results for input(s): "AMMONIA" in the last 8760 hours. CBC: Recent Labs    11/04/22 0809 11/07/22 0218 11/16/22 1701 11/21/22 0411 11/21/22 1543 11/22/22 0318 11/23/22 0028  WBC 7.8   < > 6.8 6.6  --  5.5 5.0  NEUTROABS 4.8  --  5.1 4.6  --   --   --   HGB 9.5*   < > 9.9* 10.9* 10.5* 8.9* 8.5*  HCT 28.6*   < > 29.8* 33.1* 31.0* 26.9* 25.6*  MCV 96.9   < > 95.2 94.6  --  94.4 94.5  PLT 228   < > 312 444*  --  341 318   < > = values in this interval not displayed.   Lipid Panel: Recent Labs    03/08/22 0014 04/07/22 1200 11/03/22 0546  CHOL 215* 147 121  HDL 43 50 43  LDLCALC 144* 79 69  TRIG 139 92 43  CHOLHDL 5.0 2.9 2.8   Lab Results  Component Value Date  HGBA1C 8.1 (H) 11/21/2022    Procedures since last visit: CARDIAC CATHETERIZATION  Result Date: 11/22/2022   Ost RCA to Mid RCA lesion is 100% stenosed.   Prox LAD  lesion is 60% stenosed.   Mid LAD lesion is 50% stenosed.   Ramus lesion is 100% stenosed.   Ost Cx to Prox Cx lesion is 100% stenosed.   Mid RCA to Dist RCA lesion is 50% stenosed.   There is mild left ventricular systolic dysfunction.   LV end diastolic pressure is mildly elevated.   The left ventricular ejection fraction is 45-50% by visual estimate. CVTS consult if patient is agreeable to surgery otherwise treat medically.   [place] EKG SITE RITE  Result Date: 11/21/2022 If [facility] image not attached, placement could not be confirmed due to current cardiac rhythm.  DG CHEST PORT 1 VIEW  Result Date: 11/21/2022 CLINICAL DATA:  Shortness of breath. EXAM: PORTABLE CHEST 1 VIEW COMPARISON:  None Available. FINDINGS: Stable cardiac enlargement. Unchanged blunting of the costophrenic angles. Decreased aeration to the lung bases compatible with a atelectasis or airspace disease. Thoracolumbar scoliosis. IMPRESSION: 1. Unchanged cardiac enlargement and small pleural effusions. 2. Decreased aeration to the lung bases compatible with atelectasis and/or airspace disease. Electronically Signed   By: Signa Kell M.D.   On: 11/21/2022 16:27   DG Chest Port 1 View  Result Date: 11/21/2022 CLINICAL DATA:  Shortness of breath and chest pain. EXAM: PORTABLE CHEST 1 VIEW COMPARISON:  11/16/2022 FINDINGS: Lordotic positioning. The cardio pericardial silhouette is enlarged. Interstitial markings are diffusely coarsened with chronic features. Similar appearance of left base collapse/consolidation with possible tiny left pleural effusion. Minimal collapse/consolidation of the right base is similar to prior. Thoracolumbar scoliosis evident. Telemetry leads overlie the chest. IMPRESSION: Similar appearance of left base collapse/consolidation with possible tiny left pleural effusion. Electronically Signed   By: Kennith Center M.D.   On: 11/21/2022 05:57   DG Chest 2 View  Result Date: 11/16/2022 CLINICAL DATA:  Shortness  of breath and congestion. EXAM: CHEST - 2 VIEW COMPARISON:  Chest radiographs 11/02/2022, 03/07/2022, 09/02/2021 FINDINGS: Cardiac silhouette is again moderately to markedly enlarged. Mediastinal contours are within normal limits. Mild calcification within the aortic arch. There isincreased left basilar/retrocardiac opacity compared to the prior 11/02/2021 frontal and lateral chest radiographs. This may represent left basilar atelectasis versus pneumonia. It is difficult to exclude a small left pleural effusion. Mild right basilar linear subsegmental atelectasis is similar to prior. No pneumothorax is seen. No acute skeletal abnormality. IMPRESSION: Increased left basilar/retrocardiac opacity is similar to the frontal radiograph 11/02/2022 but new compared to the prior 11/02/2021 frontal and lateral chest radiographs. This may represent left basilar atelectasis versus pneumonia. It is difficult to exclude a small left pleural effusion. Electronically Signed   By: Neita Garnet M.D.   On: 11/16/2022 16:58   DG Abd 1 View  Result Date: 11/05/2022 CLINICAL DATA:  Acute coronary syndrome EXAM: ABDOMEN - 1 VIEW COMPARISON:  CT abdomen and pelvis 05/14/2020 FINDINGS: S-shaped scoliosis in the thoracolumbar spine. Mild elevation of the right hemidiaphragm. Bowel gas in the abdomen and pelvis with a nonobstructive bowel gas pattern. Round densities along the left side of the pelvis are most likely associated with bowel. Sclerosis and degenerative changes at the symphysis pubis. IMPRESSION: 1. Nonobstructive bowel gas pattern. 2. S-shaped scoliosis in the thoracolumbar spine. Electronically Signed   By: Richarda Overlie M.D.   On: 11/05/2022 09:23   EEG adult  Result Date: 11/05/2022  Charlsie Quest, MD     11/05/2022  9:19 AM Patient Name: [name] Long MRN: [id number] Epilepsy Attending: Charlsie Quest Referring Physician/Provider: Milon Dikes, MD Date: 11/05/2022 Duration: 22.17 mins Patient history: 72yo F with  worsening memory and dementia getting eeg to evaluate for seizure Level of alertness: Awake AEDs during EEG study: Ativan Technical aspects: This EEG study was done with scalp electrodes positioned according to the 10-20 International system of electrode placement. Electrical activity was reviewed with band pass filter of 1-70Hz , sensitivity of 7 uV/mm, display speed of 7mm/sec with a 60Hz  notched filter applied as appropriate. EEG data were recorded continuously and digitally stored.  Video monitoring was available and reviewed as appropriate. Description: The posterior dominant rhythm consists of 8 Hz activity of moderate voltage (25-35 uV) seen predominantly in posterior head regions, symmetric and reactive to eye opening and eye closing. Hyperventilation and photic stimulation were not performed.   IMPRESSION: This study is within normal limits. No seizures or epileptiform discharges were seen throughout the recording. A normal interictal EEG does not exclude the diagnosis of epilepsy. Priyanka Annabelle Harman   CT HEAD WO CONTRAST ( )  Result Date: 11/04/2022 CLINICAL DATA:  Altered mental status EXAM: CT HEAD WITHOUT CONTRAST TECHNIQUE: Contiguous axial images were obtained from the base of the skull through the vertex without intravenous contrast. RADIATION DOSE REDUCTION: This exam was performed according to the departmental dose-optimization program which includes automated exposure control, adjustment of the mA and/or kV according to patient size and/or use of iterative reconstruction technique. COMPARISON:  CT Head 05/07/21 FINDINGS: Brain: No evidence of acute infarction, hemorrhage, hydrocephalus, extra-axial collection or mass lesion/mass effect. Punctate calcification in the posterior left temporal lobe is unchanged. Vascular: No hyperdense vessel or unexpected calcification. Skull: Normal. Negative for fracture or focal lesion. Sinuses/Orbits: No middle ear or mastoid effusion. Paranasal sinuses are  clear. Orbits are unremarkable. Other: None. IMPRESSION: No acute intracranial abnormality. Electronically Signed   By: Lorenza Cambridge M.D.   On: 11/04/2022 18:55   ECHOCARDIOGRAM COMPLETE  Result Date: 11/03/2022    ECHOCARDIOGRAM REPORT   Patient Name:   [name] Long Date of Exam: 11/03/2022 Medical Rec #:  [id number]        Height:       65.0 in Accession #:    [id number]       Weight:       145.5 lb Date of Birth:  January 16, 1951        BSA:          1.728 m Patient Age:    72 years         BP:           114/55 mmHg Patient Gender: F                HR:           88 bpm. Exam Location:  Inpatient Procedure: 2D Echo, Cardiac Doppler and Color Doppler Indications:     Acute ischemic heart disease, unspecified I24.9, CHF I50.9  History:         Patient has prior history of Echocardiogram examinations, most                  recent 03/08/2022. CHF, Previous Myocardial Infarction, COPD,                  Arrythmias:Atrial Fibrillation and Atrial Flutter; Risk  Factors:Hypertension. CKD, stage 3.  Sonographer:     Lucendia Herrlich Referring Phys:  [id number] Orpah Cobb Diagnosing Phys: Orpah Cobb MD  Sonographer Comments: Image acquisition challenging due to uncooperative patient and Image acquisition challenging due to patient behavioral factors. IMPRESSIONS  1. Left ventricular ejection fraction, by estimation, is 50 to 55%. The left ventricle has low normal function. The left ventricle has no regional wall motion abnormalities. Left ventricular diastolic function could not be evaluated.  2. Right ventricular systolic function is normal. The right ventricular size is normal.  3. Left atrial size was moderately dilated.  4. Right atrial size was mildly dilated.  5. There is no evidence of cardiac tamponade.  6. The mitral valve is grossly normal. Moderate mitral valve regurgitation.  7. The aortic valve is tricuspid. Aortic valve regurgitation is not visualized. Aortic valve sclerosis is present, with no  evidence of aortic valve stenosis.  8. There is mild (Grade II) atheroma plaque involving the aortic root and ascending aorta. FINDINGS  Left Ventricle: Left ventricular ejection fraction, by estimation, is 50 to 55%. The left ventricle has low normal function. The left ventricle has no regional wall motion abnormalities. The left ventricular internal cavity size was normal in size. There is no left ventricular hypertrophy. Left ventricular diastolic function could not be evaluated. Right Ventricle: The right ventricular size is normal. No increase in right ventricular wall thickness. Right ventricular systolic function is normal. Left Atrium: Left atrial size was moderately dilated. Right Atrium: Right atrial size was mildly dilated. Pericardium: Trivial pericardial effusion is present. The pericardial effusion is circumferential. There is no evidence of cardiac tamponade. Mitral Valve: The mitral valve is grossly normal. Moderate mitral valve regurgitation. Tricuspid Valve: The tricuspid valve is normal in structure. Tricuspid valve regurgitation is mild. Aortic Valve: Cannot exclude vegetation. The aortic valve is tricuspid. Aortic valve regurgitation is not visualized. Aortic valve sclerosis is present, with no evidence of aortic valve stenosis. Pulmonic Valve: The pulmonic valve was normal in structure. Pulmonic valve regurgitation is trivial. Aorta: The aortic root is normal in size and structure. There is mild (Grade II) atheroma plaque involving the aortic root and ascending aorta. Venous: The inferior vena cava was not well visualized. IAS/Shunts: The atrial septum is grossly normal.  LEFT VENTRICLE PLAX 2D LVIDd:         4.30 cm LVIDs:         3.00 cm LV PW:         1.00 cm LV IVS:        0.90 cm LVOT diam:     2.00 cm LVOT Area:     3.14 cm  LEFT ATRIUM         Index LA diam:    4.60 cm 2.66 cm/m   AORTA Ao Root diam: 2.70 cm Ao Asc diam:  3.20 cm TRICUSPID VALVE TR Peak grad:   12.5 mmHg TR Vmax:         177.00 cm/s  SHUNTS Systemic Diam: 2.00 cm Orpah Cobb MD Electronically signed by Orpah Cobb MD Signature Date/Time: 11/03/2022/12:41:43 PM    Final    DG Chest Port 1 View  Result Date: 11/02/2022 CLINICAL DATA:  Cough.  Shortness of breath. EXAM: PORTABLE CHEST 1 VIEW COMPARISON:  Chest radiograph dated 03/07/2022. FINDINGS: Bibasilar atelectasis. No focal consolidation, pleural effusion, or pneumothorax. Stable cardiomegaly. Atherosclerotic calcification of the aortic arch. No acute osseous pathology. Degenerative changes of the spine and scoliosis. IMPRESSION: Bibasilar atelectasis. No focal consolidation.  Electronically Signed   By: Elgie Collard M.D.   On: 11/02/2022 23:18    Assessment/Plan  1. NSTEMI (non-ST elevated myocardial infarction) (HCC) - was taken for LHC that showed severe RCA/Lcx disease, moderate LAD disease. CT surgery was consulted however patient declined surgery at this time -   follow up with cardiology, [name] -  continue Plavix, Metoprolol and Crestor - CBC With Differential/Platelet  2. Anxiety -  was very verbal and making up stories per son - LORazepam (ATIVAN) 0.5 MG tablet; Take 1 tablet (0.5 mg total) by mouth 2 (two) times daily as needed for anxiety.  Dispense: 60 tablet; Refill: 0  3. Chronic diastolic CHF (congestive heart failure) (HCC) -  EF 50% -  was diuresed with IV Lasix in the hospital -  no SOB -  continue Spironolactone, Torsemide - Basic Metabolic Panel with eGFR  4. Type 2 diabetes mellitus with stage 3 chronic kidney disease, without long-term current use of insulin, unspecified whether stage 3a or 3b CKD (HCC) Lab Results  Component Value Date   HGBA1C 8.1 (H) 11/21/2022   -  continue Farxiga, Tradjenta and Insulin Aspart -  check CBG daily, record and bring to next appointment  5. Schizoaffective disorder, bipolar type (HCC) -  was very verbal and makes up stories per son -  continue Depakote sprinkles, Ativan PRN  6.  Chronic obstructive pulmonary disease, unspecified COPD type (HCC) -  no wheezing -  continue Albuterol PRN  7. Chronic gastritis without bleeding, unspecified gastritis type - sucralfate (CARAFATE) 1 GM/10ML suspension; Take 10 mLs (1 g total) by mouth 2 (two) times daily as needed (antacid.).  Dispense: 420 mL; Refill: 3  8. Primary hypertension -  BP 137/88 - hydrochlorothiazide (HYDRODIURIL) 25 MG tablet; Take 25 mg by mouth daily.  9. PAF -  rate-controlled -  continue Apixaban, Plavix and Metoprolol    Labs/tests ordered:  CBC and BMP  Next appt:  Visit date not found

## 2022-12-03 NOTE — Code Documentation (Signed)
Pulse check. Palpated pulse in left femoral

## 2022-12-03 NOTE — Code Documentation (Signed)
PEA, Compressions continues. No pulse

## 2022-12-03 NOTE — Code Documentation (Signed)
Pulse check, No Pulse, CPR continued

## 2022-12-03 NOTE — ED Provider Notes (Signed)
[place] EMERGENCY DEPARTMENT AT [facility] Provider Note   CSN: [id number] Arrival date & time: 11/16/2022  1741     History {Add pertinent medical, surgical, social history, OB history to HPI:1} No chief complaint on file.   [name] Long is a 72 y.o. female.  HPI     Home Medications Prior to Admission medications   Medication Sig Start Date End Date Taking? Authorizing Provider  acetaminophen (TYLENOL) 325 MG tablet Take 2 tablets (650 mg total) by mouth every 4 (four) hours as needed for headache or mild pain. 11/07/22   Orpah Cobb, MD  albuterol (PROVENTIL) (2.5 MG/3ML) 0.083% nebulizer solution Take 3 mLs (2.5 mg total) by nebulization every 6 (six) hours as needed for wheezing or shortness of breath. 11/08/22   Medina-Vargas, Monina C, NP  apixaban (ELIQUIS) 5 MG TABS tablet Take 1 tablet (5 mg total) by mouth 2 (two) times daily. 11/24/22   Zannie Cove, MD  Blood Glucose Monitoring Suppl (FREESTYLE LITE) w/Device KIT 1 each by Does not apply route daily. Dx:E11.9 11/04/20   Ngetich, [name] C, NP  Blood Pressure Monitoring (BLOOD PRESSURE MONITOR/WRIST) KIT 1 Units by Does not apply route daily at 6 (six) AM. 05/26/20   Ngetich, [name] C, NP  clopidogrel (PLAVIX) 75 MG tablet Take 1 tablet (75 mg total) by mouth daily with breakfast. 11/25/22   Zannie Cove, MD  dapagliflozin propanediol (FARXIGA) 5 MG TABS tablet Take 1 tablet (5 mg total) by mouth daily before breakfast. 11/08/22   Orpah Cobb, MD  divalproex (DEPAKOTE SPRINKLE) 125 MG capsule Take 2 capsules (250 mg total) by mouth every 12 (twelve) hours. 11/20/22   Rinaldo Cloud, MD  glucose blood (FREESTYLE LITE) test strip Use as instructed 04/07/22   Medina-Vargas, Monina C, NP  hydrochlorothiazide (HYDRODIURIL) 25 MG tablet Take 25 mg by mouth daily. 11/25/22   [provider]  insulin aspart (NOVOLOG) 100 UNIT/ML injection Inject 0-15 Units into the skin 3 (three) times daily with meals. 11/07/22    Orpah Cobb, MD  Lancets (FREESTYLE) lancets Use to check blood sugar once daily. Dx: E11.9 04/07/22   Medina-Vargas, Monina C, NP  levothyroxine (SYNTHROID) 50 MCG tablet Take 50 mcg by mouth daily. 08/13/22   [provider]  LORazepam (ATIVAN) 0.5 MG tablet Take 1 tablet (0.5 mg total) by mouth 2 (two) times daily as needed for anxiety. 11/03/2022 11/30/23  Medina-Vargas, Monina C, NP  menthol-cetylpyridinium (CEPACOL) 3 MG lozenge Take 1 lozenge (3 mg total) by mouth as needed for sore throat. 11/07/22   Orpah Cobb, MD  methocarbamol (ROBAXIN) 500 MG tablet TAKE 1 TABLET(500 MG) BY MOUTH THREE TIMES DAILY AS NEEDED FOR MUSCLE SPASMS 10/28/22   Adonis Huguenin, NP  metoprolol succinate (TOPROL-XL) 50 MG 24 hr tablet Take 0.5 tablets (25 mg total) by mouth daily. 11/24/22   Zannie Cove, MD  nitroGLYCERIN (NITROSTAT) 0.4 MG SL tablet Place 1 tablet (0.4 mg total) under the tongue as needed. 04/04/22   Sarina Ill, DO  omeprazole (PRILOSEC) 40 MG capsule Take 40 mg by mouth 2 (two) times daily. 08/13/22   [provider]  potassium chloride (KLOR-CON M) 10 MEQ tablet Take 1 tablet (10 mEq total) by mouth daily. 11/08/22   Orpah Cobb, MD  rosuvastatin (CRESTOR) 40 MG tablet Take 1 tablet (40 mg total) by mouth daily. 04/04/22   Sarina Ill, DO  spironolactone (ALDACTONE) 25 MG tablet Take 1 tablet (25 mg total) by mouth daily.  11/24/22   Zannie Cove, MD  sucralfate (CARAFATE) 1 GM/10ML suspension Take 10 mLs (1 g total) by mouth 2 (two) times daily as needed (antacid.). 11/11/2022   Medina-Vargas, Monina C, NP  torsemide (DEMADEX) 20 MG tablet Take 1 tablet (20 mg total) by mouth daily. 11/24/22   Zannie Cove, MD  TRADJENTA 5 MG TABS tablet Take 5 mg by mouth daily. 08/15/22   [provider]      Allergies    Citrus, Fish allergy, Shellfish allergy, Adhesive [tape], Ibuprofen, Latex, Lipitor [atorvastatin calcium], Lisinopril, Other, Codeine, and  Tramadol    Review of Systems   Review of Systems  Physical Exam Updated Vital Signs BP 113/87   Pulse 73   Resp (!) 24   SpO2 100%  Physical Exam  ED Results / Procedures / Treatments   Labs (all labs ordered are listed, but only abnormal results are displayed) Labs Reviewed  CBC - Abnormal; Notable for the following components:      Result Value   RBC 3.23 (*)    Hemoglobin 10.2 (*)    HCT 32.9 (*)    MCV 101.9 (*)    RDW 17.2 (*)    All other components within normal limits  COMPREHENSIVE METABOLIC PANEL - Abnormal; Notable for the following components:   Sodium 134 (*)    Potassium 5.8 (*)    CO2 17 (*)    Glucose, Bld 338 (*)    Creatinine, Ser 1.84 (*)    Calcium 8.2 (*)    Albumin 2.6 (*)    GFR, Estimated 29 (*)    All other components within normal limits  I-STAT VENOUS BLOOD GAS, ED - Abnormal; Notable for the following components:   pH, Ven 6.954 (*)    pCO2, Ven 79.5 (*)    pO2, Ven 84 (*)    Bicarbonate 17.6 (*)    TCO2 20 (*)    Acid-base deficit 15.0 (*)    Potassium 5.8 (*)    Calcium, Ion 1.10 (*)    HCT 34.0 (*)    Hemoglobin 11.6 (*)    All other components within normal limits  URINALYSIS, ROUTINE W REFLEX MICROSCOPIC  RAPID URINE DRUG SCREEN, HOSP PERFORMED  CBG MONITORING, ED  TROPONIN I (HIGH SENSITIVITY)    EKG None  Radiology No results found.  Procedures Procedures  {Document cardiac monitor, telemetry assessment procedure when appropriate:1}  Medications Ordered in ED Medications  methylPREDNISolone sodium succinate (SOLU-MEDROL) 125 mg/2 mL injection 125 mg (has no administration in time range)  naloxone (NARCAN) injection 0.4 mg (has no administration in time range)  magnesium sulfate IVPB 2 g 50 mL (has no administration in time range)  fentaNYL in NS (91mcg/ml) infusion-PREMIX (has no administration in time range)  fentaNYL (SUBLIMAZE) bolus via infusion 25-100 mcg (has no administration in time range)   norepinephrine (LEVOPHED) 4-5 MG/250ML-% infusion SOLN (has no administration in time range)  ketamine 50 mg in normal saline 5 mL (10 mg/mL) syringe (120 mg Intravenous Given 11/24/2022 1804)  EPINEPHrine (EPI-PEN) injection 0.3 mg (0.3 mg Intramuscular Given 11/16/2022 1801)  rocuronium (ZEMURON) injection 75 mg (75 mg Intravenous Given 11/20/2022 1806)  EPINEPHrine (ADRENALIN) 1 MG/10ML injection (1 mg Intravenous Given 11/25/2022 1811)    ED Course/ Medical Decision Making/ A&P   {   Click here for ABCD2, HEART and other calculatorsREFRESH Note before signing :1}  Medical Decision Making Risk Prescription drug management.   ***  {Document critical care time when appropriate:1} {Document review of labs and clinical decision tools ie heart score, Chads2Vasc2 etc:1}  {Document your independent review of radiology images, and any outside records:1} {Document your discussion with family members, caretakers, and with consultants:1} {Document social determinants of health affecting pt's care:1} {Document your decision making why or why not admission, treatments were needed:1} Final Clinical Impression(s) / ED Diagnoses Final diagnoses:  None    Rx / DC Orders ED Discharge Orders     None

## 2022-12-03 NOTE — ED Triage Notes (Signed)
Patient BIB EMS for respiratory distress. Patient unable to tolerate c-pap with EMS.

## 2022-12-03 NOTE — Code Documentation (Signed)
Patient family in waiting room, MD updated family

## 2022-12-03 NOTE — ED Notes (Signed)
[facility] called. Ref [id number]

## 2022-12-03 NOTE — Code Documentation (Signed)
Family notified by MD.

## 2022-12-03 NOTE — Code Documentation (Signed)
Pulse check, No Pulse, No cardiac activity per ultrasound, NO B/P . Time of Death called by Dr. Eloise Harman @ 1901

## 2022-12-03 NOTE — ED Notes (Signed)
Body tagged and taken to morgue.

## 2022-12-03 NOTE — ED Notes (Signed)
Critical Care and Cardiology paged by [facility], [employer].

## 2022-12-03 NOTE — Code Documentation (Signed)
Pulse check, Palpated pulse to left femoral

## 2022-12-03 NOTE — Code Documentation (Addendum)
No pulse, CPR ongoing, Compression onging .

## 2022-12-03 DEATH — deceased

## 2022-12-05 ENCOUNTER — Ambulatory Visit: Payer: Medicare Other | Admitting: Orthopedic Surgery

## 2022-12-08 ENCOUNTER — Ambulatory Visit: Payer: Medicare Other | Admitting: Adult Health

## 2023-01-02 ENCOUNTER — Ambulatory Visit: Payer: Medicare Other | Admitting: Adult Health

## 2023-09-17 IMAGING — MG MM DIGITAL SCREENING BILAT W/ TOMO AND CAD
8 series · 8 of 24 positions shown · non-contrast
Comparison: Previous exam(s).

CLINICAL DATA: Screening.

EXAM:
DIGITAL SCREENING BILATERAL MAMMOGRAM WITH TOMOSYNTHESIS AND CAD
TECHNIQUE: Bilateral screening digital craniocaudal and mediolateral oblique
mammograms were obtained. Bilateral screening digital breast
tomosynthesis was performed. The images were evaluated with
computer-aided detection.

[L MLO synth-2D]
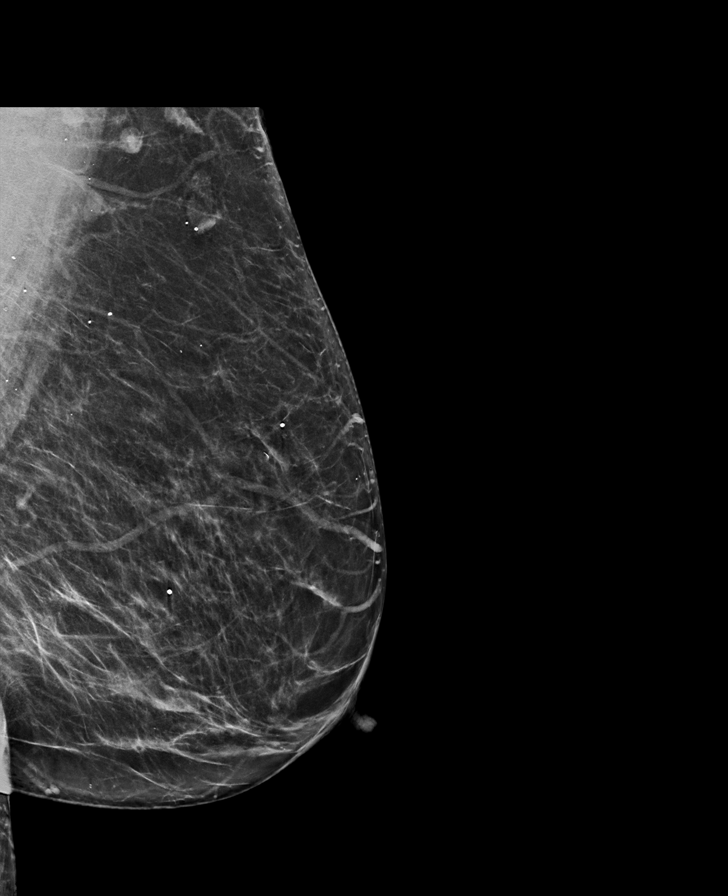

[L CC synth-2D]
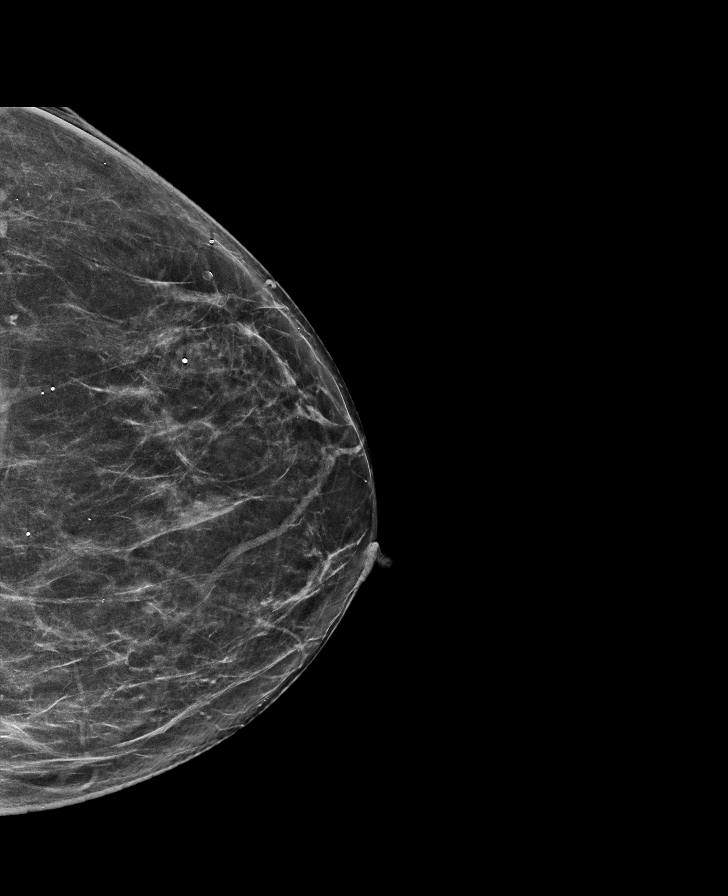

[R CC synth-2D]
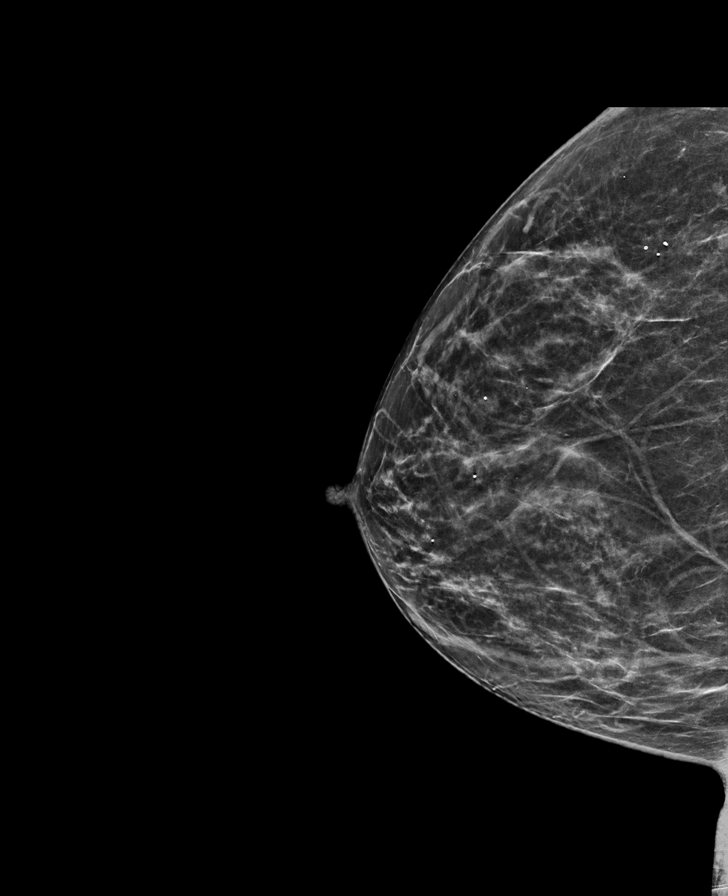

[R MLO synth-2D]
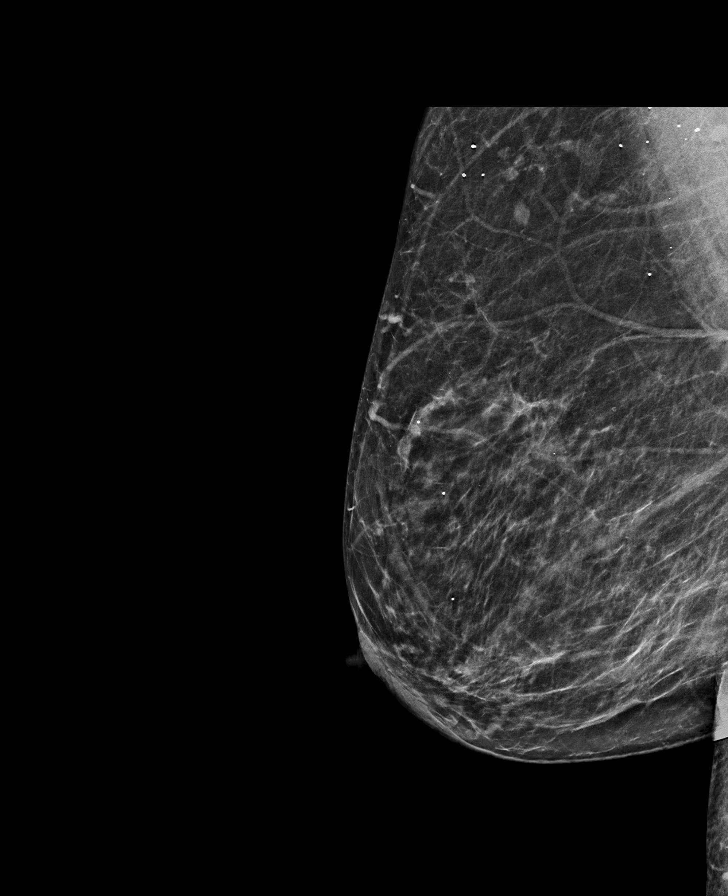

[R CC tomo · tomo slice 33/65.0]
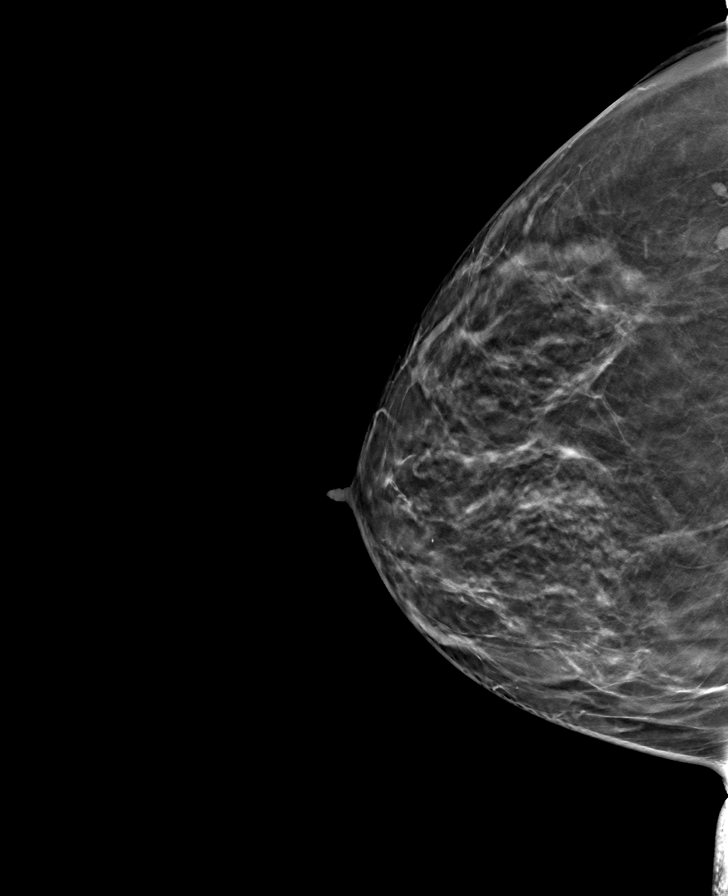

[L CC tomo · tomo slice 35/70.0]
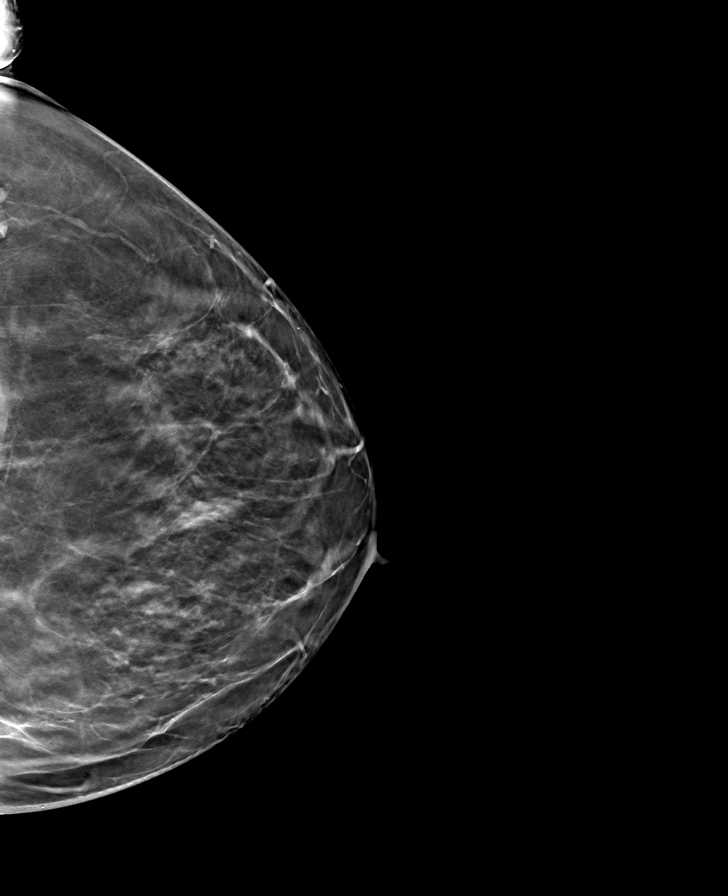

[L MLO tomo · tomo slice 37/74.0]
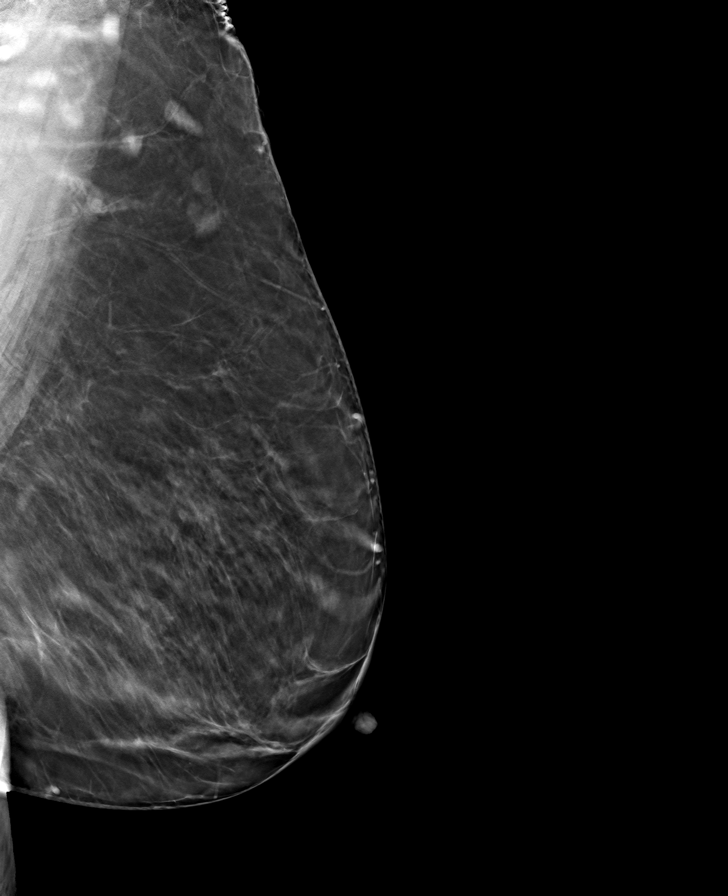

[R MLO tomo · tomo slice 35/70.0]
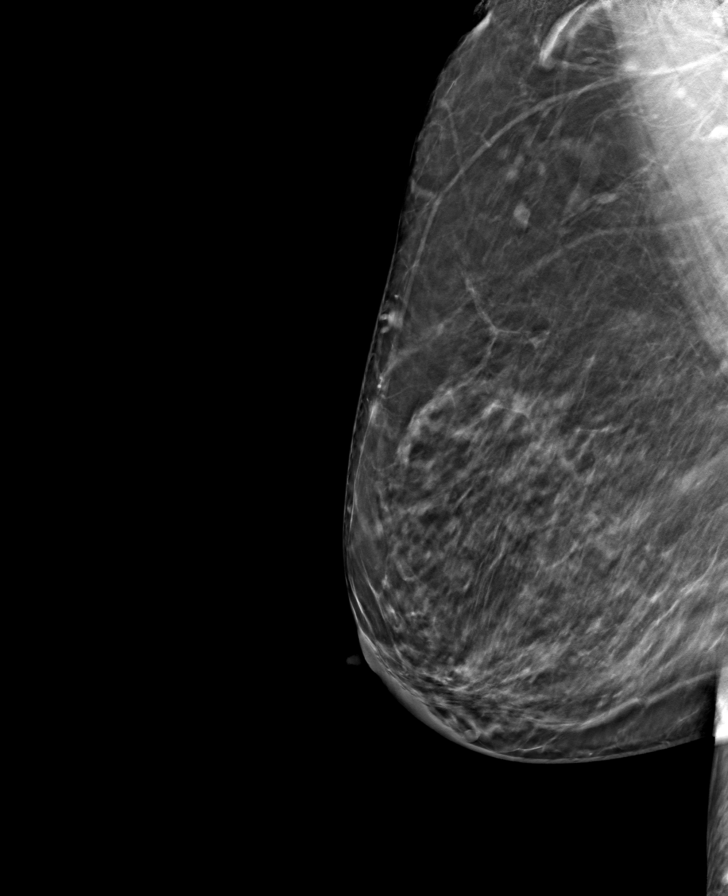

[8 of 24 positions shown; findings below may reference images not displayed]

ACR Breast Density Category b: There are scattered areas of
fibroglandular density.
FINDINGS: There are no findings suspicious for malignancy.
IMPRESSION: No mammographic evidence of malignancy. A result letter of this
screening mammogram will be mailed directly to the patient.

RECOMMENDATION:
Screening mammogram in one year. (Code:51-O-LD2)

BI-RADS CATEGORY  1: Negative.
